# Patient Record
Sex: Female | Born: 1945
Health system: Southern US, Community
[De-identification: ages and names within clinical notes are randomized; demographics above are authoritative.]

## PROBLEM LIST (undated history)

## (undated) DIAGNOSIS — K219 Gastro-esophageal reflux disease without esophagitis: Secondary | ICD-10-CM

## (undated) DIAGNOSIS — N393 Stress incontinence (female) (male): Secondary | ICD-10-CM

## (undated) DIAGNOSIS — Q210 Ventricular septal defect: Secondary | ICD-10-CM

## (undated) DIAGNOSIS — E785 Hyperlipidemia, unspecified: Secondary | ICD-10-CM

## (undated) DIAGNOSIS — G540 Brachial plexus disorders: Secondary | ICD-10-CM

## (undated) DIAGNOSIS — G43909 Migraine, unspecified, not intractable, without status migrainosus: Secondary | ICD-10-CM

## (undated) DIAGNOSIS — R55 Syncope and collapse: Secondary | ICD-10-CM

## (undated) DIAGNOSIS — I499 Cardiac arrhythmia, unspecified: Secondary | ICD-10-CM

## (undated) DIAGNOSIS — Z8711 Personal history of peptic ulcer disease: Secondary | ICD-10-CM

## (undated) DIAGNOSIS — I4719 Other supraventricular tachycardia: Secondary | ICD-10-CM

## (undated) DIAGNOSIS — J449 Chronic obstructive pulmonary disease, unspecified: Secondary | ICD-10-CM

## (undated) DIAGNOSIS — M810 Age-related osteoporosis without current pathological fracture: Secondary | ICD-10-CM

## (undated) DIAGNOSIS — M7501 Adhesive capsulitis of right shoulder: Secondary | ICD-10-CM

## (undated) DIAGNOSIS — I219 Acute myocardial infarction, unspecified: Secondary | ICD-10-CM

## (undated) DIAGNOSIS — R350 Frequency of micturition: Secondary | ICD-10-CM

## (undated) DIAGNOSIS — I679 Cerebrovascular disease, unspecified: Secondary | ICD-10-CM

## (undated) DIAGNOSIS — J42 Unspecified chronic bronchitis: Secondary | ICD-10-CM

## (undated) DIAGNOSIS — I319 Disease of pericardium, unspecified: Secondary | ICD-10-CM

## (undated) DIAGNOSIS — F419 Anxiety disorder, unspecified: Secondary | ICD-10-CM

## (undated) DIAGNOSIS — I471 Supraventricular tachycardia: Secondary | ICD-10-CM

## (undated) DIAGNOSIS — C801 Malignant (primary) neoplasm, unspecified: Secondary | ICD-10-CM

## (undated) DIAGNOSIS — C4491 Basal cell carcinoma of skin, unspecified: Secondary | ICD-10-CM

## (undated) DIAGNOSIS — I10 Essential (primary) hypertension: Secondary | ICD-10-CM

## (undated) DIAGNOSIS — J189 Pneumonia, unspecified organism: Secondary | ICD-10-CM

## (undated) DIAGNOSIS — Z95810 Presence of automatic (implantable) cardiac defibrillator: Secondary | ICD-10-CM

## (undated) DIAGNOSIS — I493 Ventricular premature depolarization: Secondary | ICD-10-CM

## (undated) DIAGNOSIS — Z8719 Personal history of other diseases of the digestive system: Secondary | ICD-10-CM

## (undated) DIAGNOSIS — I509 Heart failure, unspecified: Secondary | ICD-10-CM

## (undated) DIAGNOSIS — R011 Cardiac murmur, unspecified: Secondary | ICD-10-CM

## (undated) HISTORY — DX: Anxiety disorder, unspecified: F41.9

## (undated) HISTORY — DX: Disease of pericardium, unspecified: I31.9

## (undated) HISTORY — DX: Brachial plexus disorders: G54.0

## (undated) HISTORY — DX: Basal cell carcinoma of skin, unspecified: C44.91

## (undated) HISTORY — PX: CHOLECYSTECTOMY OPEN: SUR202

## (undated) HISTORY — PX: MULTIPLE TOOTH EXTRACTIONS: SHX2053

## (undated) HISTORY — DX: Gastro-esophageal reflux disease without esophagitis: K21.9

## (undated) HISTORY — DX: Cerebrovascular disease, unspecified: I67.9

## (undated) HISTORY — DX: Chronic obstructive pulmonary disease, unspecified: J44.9

## (undated) HISTORY — PX: CARDIAC CATHETERIZATION: SHX172

## (undated) HISTORY — DX: Hyperlipidemia, unspecified: E78.5

## (undated) HISTORY — DX: Malignant (primary) neoplasm, unspecified: C80.1

## (undated) HISTORY — DX: Ventricular septal defect: Q21.0

## (undated) HISTORY — DX: Essential (primary) hypertension: I10

## (undated) HISTORY — DX: Migraine, unspecified, not intractable, without status migrainosus: G43.909

## (undated) HISTORY — DX: Age-related osteoporosis without current pathological fracture: M81.0

## (undated) HISTORY — PX: VSD REPAIR: SHX276

---

## 1970-01-20 HISTORY — PX: TUBAL LIGATION: SHX77

## 1998-01-20 HISTORY — PX: CORONARY ANGIOGRAM: SHX5786

## 1998-12-20 ENCOUNTER — Ambulatory Visit (HOSPITAL_COMMUNITY): Admission: RE | Admit: 1998-12-20 | Discharge: 1998-12-20 | Payer: Self-pay | Admitting: Cardiology

## 1998-12-20 ENCOUNTER — Encounter: Payer: Self-pay | Admitting: Cardiology

## 2002-01-28 ENCOUNTER — Ambulatory Visit (HOSPITAL_COMMUNITY): Admission: RE | Admit: 2002-01-28 | Discharge: 2002-01-28 | Payer: Self-pay | Admitting: Internal Medicine

## 2003-11-08 ENCOUNTER — Ambulatory Visit: Payer: Self-pay

## 2003-11-29 ENCOUNTER — Ambulatory Visit: Payer: Self-pay | Admitting: *Deleted

## 2003-12-12 ENCOUNTER — Ambulatory Visit: Payer: Self-pay

## 2004-02-14 ENCOUNTER — Ambulatory Visit: Payer: Self-pay | Admitting: *Deleted

## 2004-02-23 ENCOUNTER — Ambulatory Visit: Payer: Self-pay

## 2004-03-05 ENCOUNTER — Ambulatory Visit: Payer: Self-pay | Admitting: *Deleted

## 2004-04-17 ENCOUNTER — Ambulatory Visit: Payer: Self-pay

## 2004-08-29 ENCOUNTER — Ambulatory Visit: Payer: Self-pay | Admitting: *Deleted

## 2004-11-25 ENCOUNTER — Ambulatory Visit: Payer: Self-pay

## 2005-03-04 ENCOUNTER — Ambulatory Visit: Payer: Self-pay | Admitting: *Deleted

## 2005-05-14 ENCOUNTER — Ambulatory Visit: Payer: Self-pay

## 2005-09-04 ENCOUNTER — Ambulatory Visit: Payer: Self-pay | Admitting: *Deleted

## 2005-11-05 ENCOUNTER — Ambulatory Visit: Payer: Self-pay | Admitting: Internal Medicine

## 2006-03-18 ENCOUNTER — Ambulatory Visit: Payer: Self-pay | Admitting: *Deleted

## 2006-06-24 ENCOUNTER — Ambulatory Visit: Payer: Self-pay

## 2006-11-05 ENCOUNTER — Ambulatory Visit: Payer: Self-pay | Admitting: Internal Medicine

## 2006-12-01 ENCOUNTER — Ambulatory Visit: Payer: Self-pay

## 2006-12-01 ENCOUNTER — Ambulatory Visit: Payer: Self-pay | Admitting: Internal Medicine

## 2006-12-01 ENCOUNTER — Encounter: Payer: Self-pay | Admitting: Internal Medicine

## 2006-12-01 LAB — CONVERTED CEMR LAB
ALT: 16 units/L (ref 0–35)
AST: 16 units/L (ref 0–37)
Albumin: 3.9 g/dL (ref 3.5–5.2)
Alkaline Phosphatase: 67 units/L (ref 39–117)
Bilirubin, Direct: 0.1 mg/dL (ref 0.0–0.3)
Cholesterol: 161 mg/dL (ref 0–200)
HDL: 70.2 mg/dL (ref >39.0)
LDL Cholesterol: 82 mg/dL (ref 0–99)
Total Bilirubin: 0.8 mg/dL (ref 0.3–1.2)
Total CHOL/HDL Ratio: 2.3
Total Protein: 7 g/dL (ref 6.0–8.3)
Triglycerides: 45 mg/dL (ref 0–149)
VLDL: 9 mg/dL (ref 0–40)

## 2007-05-20 ENCOUNTER — Ambulatory Visit: Payer: Self-pay | Admitting: Internal Medicine

## 2007-09-01 ENCOUNTER — Ambulatory Visit: Payer: Self-pay

## 2007-11-15 ENCOUNTER — Encounter: Payer: Self-pay | Admitting: Internal Medicine

## 2007-11-15 ENCOUNTER — Ambulatory Visit: Payer: Self-pay

## 2007-11-15 ENCOUNTER — Ambulatory Visit: Payer: Self-pay | Admitting: Internal Medicine

## 2007-12-06 ENCOUNTER — Ambulatory Visit (HOSPITAL_COMMUNITY): Admission: RE | Admit: 2007-12-06 | Discharge: 2007-12-06 | Payer: Self-pay | Admitting: Internal Medicine

## 2007-12-06 ENCOUNTER — Ambulatory Visit: Payer: Self-pay | Admitting: Cardiovascular Disease

## 2007-12-27 ENCOUNTER — Ambulatory Visit: Payer: Self-pay | Admitting: Internal Medicine

## 2007-12-27 LAB — CONVERTED CEMR LAB
BUN: 12 mg/dL (ref 6–23)
CO2: 31 meq/L (ref 19–32)
Calcium: 9.4 mg/dL (ref 8.4–10.5)
Chloride: 104 meq/L (ref 96–112)
Creatinine, Ser: 0.6 mg/dL (ref 0.4–1.2)
GFR calc Af Amer: 131 mL/min
GFR calc non Af Amer: 108 mL/min
Glucose, Bld: 54 mg/dL — ABNORMAL LOW (ref 70–99)
Potassium: 3.8 meq/L (ref 3.5–5.1)
Sodium: 142 meq/L (ref 135–145)

## 2008-02-28 ENCOUNTER — Ambulatory Visit: Payer: Self-pay | Admitting: Internal Medicine

## 2008-05-08 ENCOUNTER — Telehealth: Payer: Self-pay | Admitting: Internal Medicine

## 2008-09-06 ENCOUNTER — Encounter (INDEPENDENT_AMBULATORY_CARE_PROVIDER_SITE_OTHER): Payer: Self-pay | Admitting: *Deleted

## 2008-11-08 ENCOUNTER — Ambulatory Visit: Payer: Self-pay | Admitting: Internal Medicine

## 2008-11-10 LAB — CONVERTED CEMR LAB
ALT: 16 units/L (ref 0–35)
AST: 16 units/L (ref 0–37)

## 2008-11-13 ENCOUNTER — Ambulatory Visit: Payer: Self-pay | Admitting: Internal Medicine

## 2008-11-13 DIAGNOSIS — F17200 Nicotine dependence, unspecified, uncomplicated: Secondary | ICD-10-CM

## 2008-11-13 DIAGNOSIS — F17210 Nicotine dependence, cigarettes, uncomplicated: Secondary | ICD-10-CM | POA: Insufficient documentation

## 2008-11-13 DIAGNOSIS — Z87891 Personal history of nicotine dependence: Secondary | ICD-10-CM | POA: Insufficient documentation

## 2008-11-13 DIAGNOSIS — J441 Chronic obstructive pulmonary disease with (acute) exacerbation: Secondary | ICD-10-CM | POA: Insufficient documentation

## 2008-11-13 DIAGNOSIS — E785 Hyperlipidemia, unspecified: Secondary | ICD-10-CM | POA: Insufficient documentation

## 2008-11-13 DIAGNOSIS — J449 Chronic obstructive pulmonary disease, unspecified: Secondary | ICD-10-CM | POA: Insufficient documentation

## 2008-11-13 DIAGNOSIS — Q210 Ventricular septal defect: Secondary | ICD-10-CM

## 2008-11-13 DIAGNOSIS — I739 Peripheral vascular disease, unspecified: Secondary | ICD-10-CM

## 2008-12-19 ENCOUNTER — Ambulatory Visit: Payer: Self-pay

## 2009-05-28 ENCOUNTER — Telehealth: Payer: Self-pay | Admitting: Internal Medicine

## 2009-05-31 ENCOUNTER — Ambulatory Visit: Payer: Self-pay | Admitting: Internal Medicine

## 2009-05-31 DIAGNOSIS — R5383 Other fatigue: Secondary | ICD-10-CM | POA: Insufficient documentation

## 2009-05-31 DIAGNOSIS — R5381 Other malaise: Secondary | ICD-10-CM

## 2009-05-31 DIAGNOSIS — R079 Chest pain, unspecified: Secondary | ICD-10-CM

## 2009-06-01 LAB — CONVERTED CEMR LAB
BUN: 10 mg/dL (ref 6–23)
Basophils Absolute: 0 10*3/uL (ref 0.0–0.1)
Basophils Relative: 0.6 % (ref 0.0–3.0)
CO2: 28 meq/L (ref 19–32)
Calcium: 9.5 mg/dL (ref 8.4–10.5)
Chloride: 99 meq/L (ref 96–112)
Creatinine, Ser: 0.7 mg/dL (ref 0.4–1.2)
Digitoxin Lvl: 0.2 ng/mL — ABNORMAL LOW (ref 0.8–2.0)
Eosinophils Absolute: 0.1 10*3/uL (ref 0.0–0.7)
Eosinophils Relative: 1.2 % (ref 0.0–5.0)
GFR calc non Af Amer: 98.08 mL/min (ref >60)
Glucose, Bld: 88 mg/dL (ref 70–99)
HCT: 40.9 % (ref 36.0–46.0)
Hemoglobin: 13.9 g/dL (ref 12.0–15.0)
INR: 1.1 — ABNORMAL HIGH (ref 0.8–1.0)
Lymphocytes Relative: 39.4 % (ref 12.0–46.0)
Lymphs Abs: 2.2 10*3/uL (ref 0.7–4.0)
MCHC: 34.1 g/dL (ref 30.0–36.0)
MCV: 92.1 fL (ref 78.0–100.0)
Monocytes Absolute: 0.4 10*3/uL (ref 0.1–1.0)
Monocytes Relative: 6.9 % (ref 3.0–12.0)
Neutro Abs: 2.9 10*3/uL (ref 1.4–7.7)
Neutrophils Relative %: 51.9 % (ref 43.0–77.0)
Platelets: 91 10*3/uL — ABNORMAL LOW (ref 150.0–400.0)
Potassium: 4.5 meq/L (ref 3.5–5.1)
Pro B Natriuretic peptide (BNP): 80.8 pg/mL (ref 0.0–100.0)
Prothrombin Time: 11.3 s (ref 9.1–11.7)
RBC: 4.43 M/uL (ref 3.87–5.11)
RDW: 14.4 % (ref 11.5–14.6)
Sodium: 140 meq/L (ref 135–145)
TSH: 1.3 microintl units/mL (ref 0.35–5.50)
WBC: 5.6 10*3/uL (ref 4.5–10.5)
aPTT: 18.6 s — ABNORMAL LOW (ref 21.7–28.8)

## 2009-06-05 ENCOUNTER — Telehealth (INDEPENDENT_AMBULATORY_CARE_PROVIDER_SITE_OTHER): Payer: Self-pay | Admitting: *Deleted

## 2009-06-06 ENCOUNTER — Encounter (HOSPITAL_COMMUNITY): Admission: RE | Admit: 2009-06-06 | Discharge: 2009-08-21 | Payer: Self-pay | Admitting: Internal Medicine

## 2009-06-06 ENCOUNTER — Ambulatory Visit: Payer: Self-pay | Admitting: Cardiology

## 2009-06-06 ENCOUNTER — Ambulatory Visit: Payer: Self-pay

## 2009-06-15 ENCOUNTER — Telehealth: Payer: Self-pay | Admitting: Internal Medicine

## 2009-07-06 ENCOUNTER — Encounter (INDEPENDENT_AMBULATORY_CARE_PROVIDER_SITE_OTHER): Payer: Self-pay | Admitting: *Deleted

## 2009-07-31 ENCOUNTER — Telehealth: Payer: Self-pay | Admitting: Internal Medicine

## 2009-08-07 ENCOUNTER — Ambulatory Visit: Payer: Self-pay | Admitting: Internal Medicine

## 2009-08-08 LAB — CONVERTED CEMR LAB
BUN: 12 mg/dL (ref 6–23)
Basophils Absolute: 0.1 10*3/uL (ref 0.0–0.1)
Basophils Relative: 0.9 % (ref 0.0–3.0)
CO2: 31 meq/L (ref 19–32)
Calcium: 8.9 mg/dL (ref 8.4–10.5)
Chloride: 104 meq/L (ref 96–112)
Creatinine, Ser: 0.9 mg/dL (ref 0.4–1.2)
Eosinophils Absolute: 0.1 10*3/uL (ref 0.0–0.7)
Eosinophils Relative: 1.3 % (ref 0.0–5.0)
GFR calc non Af Amer: 64.83 mL/min (ref >60)
Glucose, Bld: 101 mg/dL — ABNORMAL HIGH (ref 70–99)
HCT: 40.1 % (ref 36.0–46.0)
Hemoglobin: 13.7 g/dL (ref 12.0–15.0)
INR: 1 (ref 0.8–1.0)
Lymphocytes Relative: 34 % (ref 12.0–46.0)
Lymphs Abs: 2.5 10*3/uL (ref 0.7–4.0)
MCHC: 34 g/dL (ref 30.0–36.0)
MCV: 92 fL (ref 78.0–100.0)
Monocytes Absolute: 0.5 10*3/uL (ref 0.1–1.0)
Monocytes Relative: 6.6 % (ref 3.0–12.0)
Neutro Abs: 4.1 10*3/uL (ref 1.4–7.7)
Neutrophils Relative %: 57.2 % (ref 43.0–77.0)
Platelets: 271 10*3/uL (ref 150.0–400.0)
Potassium: 4.4 meq/L (ref 3.5–5.1)
Prothrombin Time: 10.9 s (ref 9.7–11.8)
RBC: 4.36 M/uL (ref 3.87–5.11)
RDW: 14.6 % (ref 11.5–14.6)
Sodium: 139 meq/L (ref 135–145)
WBC: 7.2 10*3/uL (ref 4.5–10.5)
aPTT: 26.2 s (ref 21.7–28.8)

## 2009-08-10 ENCOUNTER — Telehealth: Payer: Self-pay | Admitting: Internal Medicine

## 2009-08-13 ENCOUNTER — Telehealth: Payer: Self-pay | Admitting: Internal Medicine

## 2009-08-13 DIAGNOSIS — R0602 Shortness of breath: Secondary | ICD-10-CM | POA: Insufficient documentation

## 2009-08-29 ENCOUNTER — Ambulatory Visit: Payer: Self-pay | Admitting: Internal Medicine

## 2009-08-30 ENCOUNTER — Encounter: Payer: Self-pay | Admitting: Internal Medicine

## 2009-08-31 ENCOUNTER — Ambulatory Visit: Payer: Self-pay | Admitting: Internal Medicine

## 2009-09-19 ENCOUNTER — Telehealth: Payer: Self-pay | Admitting: Internal Medicine

## 2010-02-15 ENCOUNTER — Ambulatory Visit: Admit: 2010-02-15 | Payer: Self-pay | Admitting: Internal Medicine

## 2010-02-19 NOTE — Letter (Signed)
Summary: Appointment - Reminder 2  [facility] [employer], Main Office  [address]   [place], [place] [address]   Phone: [phone]  Fax: [phone]     July 06, 2009 MRN: [id number]   [facility] [address] [place], [place]  [address]   Dear Ms. NOREN,  Our records indicate that it is time to schedule a follow-up appointment with Dr. Tenny Craw in August. It is very important that we reach you to schedule this appointment. We look forward to participating in your health care needs. Please contact us at the number listed above at your earliest convenience to schedule your appointment.  If you are unable to make an appointment at this time, give [place] a call so we can update our records.     Sincerely,  [facility] Scheduling Team

## 2010-02-19 NOTE — Progress Notes (Signed)
Summary: please mail paper for handicapp sticker when ready  Phone Note Call from Patient   Caller: Patient Reason for Call: Talk to Nurse Summary of Call: pt in on tuesday and brought a paper for dr Tenny Craw to sign for her a handicapp sticker-would like for [place] to mail when ready please Initial call taken by: Glynda Jaeger,  August 10, 2009 9:33 AM  Follow-up for Phone Call        Handicapp sticker signed and mailed to patient...she is aware. She states that she feels better lately. No complaints of chest pain or fatique. Does admit to some SOB walking up hills but continues to smoke. She does not think she wants to have heart catherization done but will call me back on 7/25 to let me know her decision. Will let Dr.Jerell Demery know. Follow-up by: Suzan Garibaldi RN  Additional Follow-up for Phone Call Additional follow up Details #1::        i would recommend PFTs at the least if she doesn't have cath. Additional Follow-up by: Sherrill Raring, MD, [facility],  August 10, 2009 5:40 PM

## 2010-02-19 NOTE — Progress Notes (Signed)
Summary: wants to speak with Dr Tenny Craw; DR. Tenny Craw TO REVIEW  (07/05/09  JS)  Phone Note Call from Patient Call back at Home Phone [phone]   Caller: Patient Summary of Call: Pt calling regarding test results Initial call taken by: Judie Grieve,  Jun 15, 2009 8:34 AM  Follow-up for Phone Call        Reviewed results with pt and that Dr Tenny Craw would like to review prior MRI before making further reccomendations.  Pt states she has had 2 more days where she had no energy and feeling like she needed to lay down.  These days where not together.  The heat has made it worse.  She also states she has had some fleeting chest pain that last approximately 30 to 45 seconds and resolves on its on with rest.  She does have this discomfort on days she has no energy but denies any radiation, SOB, N/V or diaphoresis.  She is pain free at this time.  Informed pt I will send this information to Dr Tenny Craw for her review.  Pt request we call her on her home phone 1st and if she isn't there to call her cell [phone]. Follow-up by: Charolotte Capuchin, RN,  Jun 15, 2009 9:09 AM  Additional Follow-up for Phone Call Additional follow up Details #1::        Patient was called wks ago. Additional Follow-up by: Sherrill Raring, MD, [facility],  July 16, 2009 1:59 PM

## 2010-02-19 NOTE — Miscellaneous (Signed)
Summary: Orders Update  Clinical Lists Changes  Orders: Added new Test order of T-2 View CXR (71020TC) - Signed 

## 2010-02-19 NOTE — Assessment & Plan Note (Signed)
Summary: 10 MO F/U   Visit Type:  Follow-up Primary Rylinn Linzy:  dr Barnabas Lister in [place]   History of Present Illness: patient is a 65 year old with a history of VSD status post repair, peripheral vascular disease, dyslipidemia, and continued tobacco use. I last saw her in May.   When I saw her last she had complaints of chest tightness, fatigue, shortness of breath.  She had a myoview that showed septal, lateral and apical scar.  No ischemia.  CXR showed no acute problems.  PFTs showed mod  defect that improved some with inhalers.   Today the paitient said she really has not had any more spells of tightness. She has occasional chest pains that she has had for a long time, unchanged.   Breathing is unchanged. She continues to smoke.  She does not think she needs a cardiac catheterization.  Current Medications (verified): 1)  Lanoxin 0.125 Mg Tabs (Digoxin) .... Take 1/2 Tablet By Mouth Once A Day 2)  Lisinopril 5 Mg Tabs (Lisinopril) .... Take 1 Tablet By Mouth Once A Day 3)  Aspirin 81 Mg  Tabs (Aspirin) .Marland Kitchen.. 1 Tab Once Daily 4)  Zocor 40 Mg Tabs (Simvastatin) .Marland Kitchen.. 1 Tab Once Daily 5)  Furosemide 20 Mg Tabs (Furosemide) .... Take One-Half  Tablet By Mouth Daily. 6)  Celebrex 200 Mg Caps (Celecoxib) .... As Needed  Allergies (verified): 1)  ! Codeine  Past History:  Past medical, surgical, family and social histories (including risk factors) reviewed, and no changes noted (except as noted below).  Past Medical History: Reviewed history from 11/13/2008 and no changes required. VSD PVOD COPD Dyslipidemia tobacco abuse Anxiety, GERD Pericarditis Thoracic outlet syndrom  Past Surgical History: Reviewed history from 11/13/2008 and no changes required. VSD closure.  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 Father died of throat Ca Mother with hx HTN FHx  of CVA  Social History: Reviewed history from 11/13/2008 and no changes required. married Tobacco 1/3ppd No etoh  Vital Signs:  Patient profile:   65 year old female Height:      61 inches Weight:      133 pounds Pulse rate:   64 / minute BP sitting:   106 / 66  (left arm)  Vitals Entered By: Burnett Kanaris, CNA (August 31, 2009 11:39 AM)  Serial Vital Signs/Assessments:  Time      Position  BP       Pulse  Resp  Temp     By 0 mi n    Lying LA  104/66   65                    Burnett Kanaris, CNA 0 mi n    Sitting   106/66   64                    Burnett Kanaris, CNA 0 mi n    Standing  93/60    70                    Burnett Kanaris, CNA 2 min     Standing  98/66    74                    Burnett Kanaris, CNA 3 min     Standing  102/68   74  Burnett Kanaris, CNA  Comments: 0 mi n no dizziness By: Burnett Kanaris, CNA  2 min no dizziness By: Burnett Kanaris, CNA  3 min no dizziness By: Burnett Kanaris, CNA    Physical Exam  Additional Exam:  Patient is in NAD HEENT:  Normocephalic, atraumatic. EOMI, PERRLA.  Neck: JVP is normal.  Lungs: . No rales no wheezes. Rhonchi Heart: Regular rate and rhythm. Normal S1, S2. No S3.   No significant murmurs.   Abdomen:  Supple, nontender. Normal bowel sounds. No masses.  Extremities:    No lower extremity edema.  Musculoskeletal :moving all extremities.  Neuro:   alert and oriented x3.    EKG  Procedure date:  08/31/2009  Findings:      Ectopic atrial rhythm.  64 bpm.  RBBB.  Impression & Recommendations:  Problem # 1:  CHEST PAIN UNSPECIFIED (ICD-786.50) Patient denies any problems now.  She does not want to have  a cardiac cath.  Said she would let me know if her symptoms worsen. No change for regimen for now.  Will review scans.  Keep on same regimen.  Problem # 2:  COPD (ICD-496) COntinue inhalers. Her updated medication list for this problem includes:    Ventolin Hfa 108 (90 Base) Mcg/act Aers (Albuterol  sulfate) ..... Use every 4 to 6 hours only as needed    Xopenex Hfa 45 Mcg/act Aero (Levalbuterol tartrate) .Marland Kitchen... 1 puff 2 times per day  Problem # 3:  HYPERLIPIDEMIA-MIXED (ICD-272.4) Keep on same regimen. Her updated medication list for this problem includes:    Zocor 40 Mg Tabs (Simvastatin) .Marland Kitchen... 1 tab once daily  Other Orders: EKG w/ Interpretation (93000) Prescriptions: VENTOLIN HFA 108 (90 BASE) MCG/ACT AERS (ALBUTEROL SULFATE) use every 4 to 6 hours only as needed  #1 x 6   Entered by:   Layne Benton, RN, BSN   Authorized by:   Sherrill Raring, MD, [facility]   Signed by:   Layne Benton, RN, BSN on 08/31/2009   Method used:   Electronically to        [facility] (retail)       [address]       [place], [place]  [address]       Ph: [phone]       Fax: [phone]   RxID:   [id number]

## 2010-02-19 NOTE — Progress Notes (Signed)
Summary: Pt returning call  Phone Note Call from Patient Call back at Work Phone [phone]   Summary of Call: Pt returning call from Friday Initial call taken by: Judie Grieve,  August 13, 2009 9:18 AM  Follow-up for Phone Call        Called patient....since she is feeling better and is only c/o of SOB with exertion Dr.Ross said she could hold off on heart cath and have PFT's done. Will make referral for PFT's. Follow-up by: Suzan Garibaldi RN  New Problems: SHORTNESS OF BREATH (ICD-786.05)   New Problems: SHORTNESS OF BREATH (ICD-786.05)

## 2010-02-19 NOTE — Assessment & Plan Note (Signed)
Summary: Sting in chest, tired all the time,cough/NM   Visit Type:  Follow-up Primary Provider:  dr Barnabas Lister in [place]  CC:  fatigue and stinging in chest.  History of Present Illness:  patient is a 65 year old with a history of VSD status post repair, peripheral vascular disease, dyslipidemia, and continued tobacco use. I last saw her in October. Since seen she has callied in with complaints of chest pain. Patient.. c/o of a sting feeling  on left breast at center of chest. Pt. states she has had this for many years. It has bother her more  the last two days. She coughs a lot during the night. Pt. states she has had a sinus infection and ear infection for which she took antibiotics for 6 days. She feels better about that now. She is very outgoing can't sit still. She is very tired, exhausted now.  Current Medications (verified): 1)  Lanoxin 0.125 Mg Tabs (Digoxin) .... Take 1/2 Tablet By Mouth Once A Day 2)  Lisinopril 5 Mg Tabs (Lisinopril) .... Take 1 Tablet By Mouth Once A Day 3)  Aspirin 81 Mg  Tabs (Aspirin) .Marland Kitchen.. 1 Tab Once Daily 4)  Zocor 40 Mg Tabs (Simvastatin) .Marland Kitchen.. 1 Tab Once Daily 5)  Furosemide 20 Mg Tabs (Furosemide) .... Take One-Half  Tablet By Mouth Daily. 6)  Celebrex 200 Mg Caps (Celecoxib) .... As Needed  Allergies (verified): 1)  ! Codeine  Past History:  Family History: Last updated: 2008-11-19 Father died of throat Ca Mother with hx HTN FHx of CVA  Past medical, surgical, family and social histories (including risk factors) reviewed, and no changes noted (except as noted below).  Past Medical History: Reviewed history from 11-19-08 and no changes required. VSD PVOD COPD Dyslipidemia tobacco abuse Anxiety, GERD Pericarditis Thoracic outlet syndrom  Past Surgical History: Reviewed history from 2008-11-19 and no changes required. VSD closure.  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 Father died of throat Ca Mother with hx HTN FHx of CVA  Social History: Reviewed history from 11/19/08 and no changes required. married Tobacco 1/3ppd No etoh  Vital Signs:  Patient profile:   65 year old female Height:      60 inches Weight:      131 pounds BMI:     25.68 Pulse rate:   67 / minute BP sitting:   124 / 83  (left arm) Cuff size:   regular  Vitals Entered By: Layne Benton, RN, BSN (May 31, 2009 12:36 PM)  Physical Exam  Additional Exam:  Patient is in NAD HEENT:  Normocephalic, atraumatic. EOMI, PERRLA.  Neck: JVP is normal. No thyromegaly. No bruits.  Lungs: clear to auscultation. No rales no wheezes.  Heart: Regular rate and rhythm. Normal S1, S2. No S3.   No significant murmurs. PMI not displaced.  Abdomen:  Supple, nontender. Normal bowel sounds. No masses. No hepatomegaly.  Extremities:   Good distal pulses throughout. No lower extremity edema.  Musculoskeletal :moving all extremities.  Neuro:   alert and oriented x3.    EKG  Procedure date:  05/31/2009  Findings:      NSR>  67 bpm.  Occas PVC.  RBBB.  Impression & Recommendations:  Problem # 1:  CHEST PAIN UNSPECIFIED (ICD-786.50) Patient complaining of chest pains and signifiant fatigue.  I would recommen checking labs today.  WIll tentatively set patient up for adenosine myoview. Volume status  looks good.  Will need to review echo though with decrase in EF.  Problem # 2:  FATIGUE (ICD-780.79) As above.  Problem # 3:  HYPERLIPIDEMIA-MIXED (ICD-272.4) Keep on meds. Her updated medication list for this problem includes:    Zocor 40 Mg Tabs (Simvastatin) .Marland Kitchen... 1 tab once daily  Problem # 4:  TOBACCO ABUSE (ICD-305.1) Counselled. on risks.  Other Orders: EKG w/ Interpretation (93000) Nuclear Stress Test (Nuc Stress Test) TLB-BMP (Basic Metabolic Panel-BMET) (80048-METABOL) TLB-BNP (B-Natriuretic Peptide)  (83880-BNPR) TLB-CBC Platelet - w/Differential (85025-CBCD) TLB-TSH (Thyroid Stimulating Hormone) (84443-TSH) TLB-Digoxin (Lanoxin) (80162-DIG) TLB-PT (Protime) (85610-PTP) TLB-PTT (85730-PTTL)  Patient Instructions: 1)  Your physician has requested that you have an adenosine myoview.  For further information please visit [email or web address]  Please follow instruction sheet, as given. 2)  Your physician recommends that you return for lab work in: lab work today   Appended Document: Sting in chest, tired all the time,cough/NM I have spoken to the patient a few times on the phone.  Myoview on 5/18 did not show any ischemia.  But the patient said that she continues to have shortness of breath, gives out easier than before.  Gets chest pressure.  THis is different than 1 year ago.  Recommend labs and CXR.  If no significant abnormalities would recommend a R and L heart cath to define anatomy and pressures.  Patient understands and is agreeable.

## 2010-02-19 NOTE — Miscellaneous (Signed)
Summary: Orders Update pft charges  Clinical Lists Changes  Orders: Added new Service order of Carbon Monoxide diffusing w/capacity (94720) - Signed Added new Service order of Lung Volumes (94240) - Signed Added new Service order of Spirometry (Pre & Post) (94060) - Signed 

## 2010-02-19 NOTE — Progress Notes (Signed)
Summary: CHEST PAINS  Phone Note Call from Patient Call back at Home Phone [phone]   Caller: Patient Summary of Call: PT BEEN HAVING CHEST PAINS Initial call taken by: Judie Grieve,  May 28, 2009 1:27 PM  Follow-up for Phone Call        Spoke with patient. Pt. c/o of a sting feeling  on left breast at center of chest. Pt. states she has had this for many years. It has bother her more  the last two days. She coughs a lot during the night. Pt. states she has had a sinus infection and ear infection for which she took antibiotics for 6 days. She feels better about that now. She is very outgoing can't sit still. She is very tired, exhausted now. Pt. states the sting feeling is not bad enough to go to ER. She is just tired of being tired. She would like to know what Dr. Tenny Craw would like for pt. to do. Ollen Gross, RN, BSN  May 28, 2009 2:04 PM Dr. Tenny Craw notified. MD recommeds  to make an appointment on next available for pt. Pt. has an appointment with Dr. Tenny Craw on 05/31/09 at 12:30 pm. Pt. aware.  Follow-up by: Ollen Gross, RN, BSN,  May 28, 2009 2:29 PM

## 2010-02-19 NOTE — Progress Notes (Signed)
  Phone Note Outgoing Call   Call placed by: Dietrich Pates Summary of Call: Called patient.  Still has SOB with activity,.  Breathing not as good as in the fall.  Hx of cont'd tobacco. Last episode of chest stinging was 2 wks ago. Recommend:  Set up for PA/Lat CXR as well as precath labs.   If not revealing will set up for R and L heart cath. Initial call taken by: Sherrill Raring, MD, [facility],  July 31, 2009 5:58 PM     Appended Document:  Called patient ...she will come in on 08/06/2009 for labs and chest xray.

## 2010-02-19 NOTE — Progress Notes (Signed)
Summary: Nuclear Pre-Procedure  Phone Note Outgoing Call   Call placed by: Milana Na, EMT-P,  Jun 05, 2009 4:33 PM Summary of Call: Reviewed information on Myoview Information Sheet (see scanned document for further details).  Spoke with patient.     Nuclear Med Background Indications for Stress Test: Evaluation for Ischemia   History: COPD   Symptoms: Chest Pain, Fatigue    Nuclear Pre-Procedure Cardiac Risk Factors: Lipids, PVD, RBBB, Smoker Height (in): 60  Nuclear Med Study Referring MD:  P.Ross

## 2010-02-19 NOTE — Progress Notes (Signed)
Summary: need antibiotic for infected tooth  Phone Note Call from Patient Call back at Home Phone [phone]   Caller: Patient Reason for Call: Talk to Nurse Summary of Call: Per pt calling pt need to have some dental work for infected tooth, In the meantime pt is looking for dentist but would like for dr. Tenny Craw to call in an  antibiotic. Initial call taken by: Lorne Skeens,  September 19, 2009 8:44 AM  Follow-up for Phone Call        [facility] for call back.Layne Benton, RN, BSN  September 19, 2009 10:02 AM   Additional Follow-up for Phone Call Additional follow up Details #1::        Patient called back and I advised her that I could not call in an antiobiotic for her because she needs to see a dentist today for possible tooth infection. She verbalized understanding. Layne Benton, RN, BSN  September 19, 2009 11:24 AM

## 2010-02-19 NOTE — Assessment & Plan Note (Signed)
Summary: Cardiology Nuclear Study  Nuclear Med Background Indications for Stress Test: Evaluation for Ischemia   History: COPD, Echo, Heart Catheterization, Myocardial Perfusion Study  History Comments: '58 & '72 VSD closure; '00 Cath:no sig. CAD; '06 Echo:EF=40-50%  Symptoms: Chest Pain, DOE, Fatigue, Palpitations, Rapid HR  Symptoms Comments: Last episode of NW:GNFAOZ, 06/03/09   Nuclear Pre-Procedure Cardiac Risk Factors: Hypertension, Lipids, PVD, RBBB, Smoker Caffeine/Decaff Intake: None NPO After: 7:00 PM Lungs: Clear.  O2 Sat 98% on RA. IV 0.9% NS with Angio Cath: 20g     IV Site: (R) AC IV Started by: Stanton Kidney EMT-P Chest Size (in) 36     Cup Size B     Height (in): 61 Weight (lb): 134 BMI: 25.41  Nuclear Med Study 1 or 2 day study:  1 day     Stress Test Type:  Eugenie Birks Reading MD:  Olga Millers, MD     Referring MD:  Dietrich Pates, MD Resting Radionuclide:  Technetium 76m Tetrofosmin     Resting Radionuclide Dose:  11 mCi  Stress Radionuclide:  Technetium 45m Tetrofosmin     Stress Radionuclide Dose:  33 mCi   Stress Protocol   Lexiscan: 0.4 mg   Stress Test Technologist:  [employer] CMA-N     Nuclear Technologist:  Domenic Polite CNMT  Rest Procedure  Myocardial perfusion imaging was performed at rest 45 minutes following the intravenous administration of Myoview Technetium 51m Tetrofosmin.  Stress Procedure  Baseline EKG shows RBBB with intermittent junctional rhythm and occasional PVC's.  The patient received IV Lexiscan 0.4 mg over 15-seconds.  Myoview injected at 30-seconds.  There were T-wave changes with lexiscan.  She did c/o chest tightness.Quantitative spect images were obtained after a 45 minute delay.  QPS Raw Data Images:  There is no interference from other nuclear activity. Stress Images:  There is decreased uptake in the septum and apical lateral wall. Rest Images:  There is decreased uptake in the septum and apical lateral  wall. Subtraction (SDS):  No evidence of ischemia. Transient Ischemic Dilatation:  1.07  (Normal <1.22)  Lung/Heart Ratio:  .29  (Normal <0.45)  Quantitative Gated Spect Images QGS EDV:  102 ml QGS ESV:  71 ml QGS EF:  30 % QGS cine images:  Akinesis of the septum and apical lateral wall; evidence of left ventricular enlargement.   Overall Impression  Exercise Capacity: Lexiscan study with no exercise. BP Response: Normal blood pressure response. Clinical Symptoms: There is chest pain. ECG Impression: Significant ST abnormalities consistent with ischemia. Overall Impression: Abnormal lexiscan nuclear study with evidence of prior septal and apical lateral infarcts; no ischemia.  Appended Document: Cardiology Nuclear Study Left msg on machine that we would call back.  Did not leave much on info on machine   Myoview scan shows no ischemia.  Blood flow rel unchanged from previous scan.  Will need to review prior MRI before making further recommendations.

## 2010-03-15 ENCOUNTER — Telehealth: Payer: Self-pay | Admitting: Internal Medicine

## 2010-03-19 NOTE — Progress Notes (Signed)
Summary: refill pt out need refilled today  Phone Note Refill Request Message from:  Patient on March 15, 2010 8:35 AM  Refills Requested: Medication #1:  LISINOPRIL 5 MG TABS Take 1 tablet by mouth once a day [facility] [phone] pt out need today  Initial call taken by: Judie Grieve,  March 15, 2010 8:36 AM    Prescriptions: LISINOPRIL 5 MG TABS (LISINOPRIL) Take 1 tablet by mouth once a day  #30 x 3   Entered by:   Burnett Kanaris, CNA   Authorized by:   Sherrill Raring, MD, [facility]   Signed by:   Burnett Kanaris, CNA on 03/15/2010   Method used:   Electronically to        [facility] (retail)       [address]       [place], [place]  [address]       Ph: [phone]       Fax: [phone]   RxID:   [id number]

## 2010-04-05 ENCOUNTER — Ambulatory Visit: Payer: Self-pay | Admitting: Internal Medicine

## 2010-05-06 ENCOUNTER — Telehealth: Payer: Self-pay | Admitting: Internal Medicine

## 2010-05-06 NOTE — Telephone Encounter (Signed)
Pt calling requesting order for lab work to be sent to [facility] office

## 2010-05-15 ENCOUNTER — Other Ambulatory Visit (INDEPENDENT_AMBULATORY_CARE_PROVIDER_SITE_OTHER): Payer: Medicare Other | Admitting: *Deleted

## 2010-05-15 DIAGNOSIS — R0989 Other specified symptoms and signs involving the circulatory and respiratory systems: Secondary | ICD-10-CM

## 2010-05-16 ENCOUNTER — Other Ambulatory Visit (INDEPENDENT_AMBULATORY_CARE_PROVIDER_SITE_OTHER): Payer: Medicare Other | Admitting: *Deleted

## 2010-05-16 DIAGNOSIS — I499 Cardiac arrhythmia, unspecified: Secondary | ICD-10-CM

## 2010-05-16 DIAGNOSIS — E7800 Pure hypercholesterolemia, unspecified: Secondary | ICD-10-CM

## 2010-05-16 DIAGNOSIS — Z79899 Other long term (current) drug therapy: Secondary | ICD-10-CM

## 2010-05-17 LAB — BASIC METABOLIC PANEL
CO2: 22 mEq/L (ref 19–32)
Calcium: 9.2 mg/dL (ref 8.4–10.5)
Creat: 0.76 mg/dL (ref 0.40–1.20)
Sodium: 139 mEq/L (ref 135–145)

## 2010-05-17 LAB — LIPID PANEL
LDL Cholesterol: 131 mg/dL — ABNORMAL HIGH (ref 0–99)
Triglycerides: 80 mg/dL (ref <150)

## 2010-05-17 LAB — DIGOXIN LEVEL: Digoxin Level: 0.2 ng/mL — ABNORMAL LOW (ref 0.8–2.0)

## 2010-05-17 LAB — AST: AST: 17 U/L (ref 0–37)

## 2010-05-18 ENCOUNTER — Encounter: Payer: Self-pay | Admitting: Internal Medicine

## 2010-05-20 ENCOUNTER — Ambulatory Visit (INDEPENDENT_AMBULATORY_CARE_PROVIDER_SITE_OTHER): Payer: Medicare Other | Admitting: Internal Medicine

## 2010-05-20 ENCOUNTER — Encounter: Payer: Self-pay | Admitting: Internal Medicine

## 2010-05-20 VITALS — BP 111/69 | HR 69 | Resp 18 | Ht 61.0 in | Wt 132.4 lb

## 2010-05-20 DIAGNOSIS — R0602 Shortness of breath: Secondary | ICD-10-CM

## 2010-05-20 DIAGNOSIS — E782 Mixed hyperlipidemia: Secondary | ICD-10-CM

## 2010-05-20 DIAGNOSIS — E785 Hyperlipidemia, unspecified: Secondary | ICD-10-CM

## 2010-05-20 DIAGNOSIS — F17200 Nicotine dependence, unspecified, uncomplicated: Secondary | ICD-10-CM

## 2010-05-20 MED ORDER — ALPRAZOLAM 0.25 MG PO TABS: ORAL_TABLET | ORAL | Status: DC

## 2010-05-20 NOTE — Patient Instructions (Addendum)
Start Crestor samples 5 mg every day with fasting lab work in 8 weeks  Stop Simvastatin  Your physician has recommended that you have a pulmonary function test. Pulmonary Function Tests are a group of tests that measure how well air moves in and out of your lungs.

## 2010-05-21 NOTE — Assessment & Plan Note (Signed)
counselled on quitting.

## 2010-05-21 NOTE — Assessment & Plan Note (Signed)
Have given samples of Crestor.  Will need to f/u lipids and AST

## 2010-05-21 NOTE — Assessment & Plan Note (Addendum)
Exam is worse today than on previous visits.  I would set up for repeat PFTs.  If worse, continue medical Rx and refer to pulmonary If unchanged i am concerned may reflect angina equivalent.  I would recomm cath to evaluate. Counselled on tobacco

## 2010-05-21 NOTE — Progress Notes (Signed)
HPI Patient is a 65 year old with a hsotyr od congenital heart disease (s/p VSD repair), PVOD, dyslipidemia, COPD and tobacco use. I saw her last in clinic in August 2011.   SInce seen she continues to be SOBwith activity.  She thinks it has gotten worse over the last 6 months.  She denies chest pains. She does continue to smoke.   She notes that she has been under increased stress over the past month taking care of her grandchildren and children of neighbors. Note, when I saw her in August she did not want to have a heart catheterization done.  She said her breathing was better.  She did have PFTs done that showed moderate COPD. She admits to not taking her Zocor regularly because she forgets to take it at night. Allergies  Allergen Reactions  . Codeine     Current Outpatient Prescriptions  Medication Sig Dispense Refill  . albuterol (VENTOLIN HFA) 108 (90 BASE) MCG/ACT inhaler Inhale 2 puffs into the lungs every 6 (six) hours as needed.        Marland Kitchen aspirin 81 MG tablet Take 81 mg by mouth daily.        . celecoxib (CELEBREX) 200 MG capsule Take 200 mg by mouth as needed.        . digoxin (LANOXIN) 0.125 MG tablet Take 62.5 mcg by mouth daily.        . furosemide (LASIX) 20 MG tablet Take 10 mg by mouth as needed.       Marland Kitchen lisinopril (PRINIVIL,ZESTRIL) 5 MG tablet Take 5 mg by mouth daily.        Marland Kitchen ALPRAZolam (XANAX) 0.25 MG tablet 1 tab 2 times per day as needed  10 tablet  0  . rosuvastatin (CRESTOR) 5 MG tablet Take 1 tablet (5 mg total) by mouth at bedtime.  30 tablet  11    Past Medical History  Diagnosis Date  . VSD (ventricular septal defect)   . PVOD (pulmonary veno-occlusive disease)   . COPD (chronic obstructive pulmonary disease)   . Dyslipidemia   . Anxiety   . GERD (gastroesophageal reflux disease)   . Pericarditis   . Thoracic outlet syndrome     Past Surgical History  Procedure Date  . Vsd repair   . Tubal ligation   . Cholecystectomy     Family History    Problem Relation Age of Onset  . Throat cancer    . Hypertension    . Stroke      History   Social History  . Marital Status: Married    Spouse Name: N/A    Number of Children: N/A  . Years of Education: N/A   Occupational History  . Not on file.   Social History Main Topics  . Smoking status: Current Everyday Smoker -- 0.3 packs/day  . Smokeless tobacco: Not on file  . Alcohol Use: No  . Drug Use: Not on file  . Sexually Active: Not on file   Other Topics Concern  . Not on file   Social History Narrative  . No narrative on file    Review of Systems:  All systems reviewed.  They are negative to the above problem except as previously stated.  Vital Signs: BP 111/69  Pulse 69  Resp 18  Ht 5\' 1"  (1.549 m)  Wt 132 lb 6.4 oz (60.056 kg)  BMI 25.02 kg/m2  Physical Exam  HEENT:  Normocephalic, atraumatic. EOMI, PERRLA.  Neck: JVP is normal.  No thyromegaly. No bruits.  Lungs: Rhonchi with decreased airflow..  Heart: Regular rate and rhythm. Normal S1, S2. No S3.   No significant murmurs. PMI not displaced.  Abdomen:  Supple, nontender. Normal bowel sounds. No masses. No hepatomegaly.  Extremities:   Good distal pulses throughout. No lower extremity edema.  Musculoskeletal :moving all extremities.  Neuro:   alert and oriented x3.  CN II-XII grossly intact.  Ectopic atrial rhythm.  70 bpm.  RBBB.  PVC.  Nonspecific ST T wave changes.   Assessment and Plan:

## 2010-06-04 NOTE — Assessment & Plan Note (Signed)
[facility]                            CARDIOLOGY OFFICE NOTE   NAME:Kimberly [name], Kimberly [name]                        MRN:          [id number]  DATE:11/15/2007                            DOB:          April 22, 1945    IDENTIFICATION:  The patient is a 65 year old woman with a history of  VSD (status post repair), I last saw her back in April, also history of  dyslipidemia, and continued tobacco use.   Since seen, she is suffering from a URI with lots of congestion.  She is  on amoxicillin for this.  Otherwise, her breathing was about the same.   CURRENT MEDICATIONS:  1. Aspirin 81.  2. Lanoxin 0.0625.  3. Simvastatin 40.  4. Lasix 10.  5. Amoxicillin 500 b.i.d.   PHYSICAL EXAMINATION:  GENERAL:  The patient is in no distress.  VITAL SIGNS:  Blood pressure 144/76, pulse 67 and regular, weight is  134.  NECK:  JVP is normal.  LUNGS:  Clear.  There is no decreased expiratory flow, some rhonchi, no  wheezes.  CARDIAC:  Regular rate and rhythm, S1 and S2.  No S3.  No significant  murmurs.  ABDOMEN:  Benign.  EXTREMITIES:  No edema.   A 12-lead EKG shows normal sinus rhythm.  Right bundle branch block.  Biphasic T-waves anteriorly.  Unchanged.   Chest x-ray status post sternotomy, RV is prominent.  No evidence of  CHF.   IMPRESSION:  1. History of congenital heart disease status post ventricular septal      defect repair x2.  An echocardiogram today I await to have read      out, the function looks down from previous, again the septum is      abnormal with the patch that she had placed.  I will need to      review, consider other modalities such as MRI or MUGA to really      evaluate her EF.  For now, I would keep her on the same medicines.      It looks like she was on Diovan at some point in the past and did      not tolerate because of lightheadedness.  It does not look like she      has been on a beta-blocker.  2. Dyslipidemia.  Last lipid panel was a  year ago.  We will need to      recheck.  3. Healthcare maintenance.  Counseled again on smoking cessation.   I will be in touch with the patient regarding followup.   ADDENDUM:  Chest x-ray status post sternotomy, RV is prominent.  No  evidence of CHF.     Pricilla Riffle, MD, [facility]  Electronically Signed    PVR/MedQ  DD: 11/15/2007  DT: 11/16/2007  Job #: [id number]

## 2010-06-04 NOTE — Assessment & Plan Note (Signed)
[facility]                            CARDIOLOGY OFFICE NOTE   NAME:[name], Kimberly LINGERFELT                        MRN:          [id number]  DATE:05/20/2007                            DOB:          [birth year]    IDENTIFICATION:  Ms. Rotter is a 65 year old woman, history of a VSD  (status post repair), previously followed by Glennon Hamilton.  I saw her  initially in November of last year.   When I saw her last, I scheduled an echocardiogram to evaluate her LV  function.  This showed an LVEF of approximately 35-40%.  When I compared  it to the images of 2006, there does not appear to be a significant  change.  There was mild tricuspid valve prolapse and mild regurgitation.  No evidence of a VSD.   Note, the patient has no significant CAD by catheterization in 2000, and  she also had a Myoview scan done in 2006 that showed distal septal scar  possibly related to VSD, no ischemia, EF of 46.   Since seen, the patient has been under tremendous stress until just a  couple days ago.  She was try to take care of her mother-in-law who is  demented and have returned her to the assisted living.  She has had some  chest pressure with this but thinks it was all stress related.  Yesterday, she was able to plant 36 tomatoes without a problem.  She  still gets short of breath some with walking a hill, but this is no real  change.   CURRENT MEDICINES:  Include:  1. Aspirin 81 mg.  2. Lanoxin 0.0625 daily.  3. Simvastatin 40.  4. Lasix 10 mg p.r.n.   REVIEW OF SYSTEMS:  The patient continues to smoke about a pack and a  half per day.   PHYSICAL EXAMINATION:  The patient is in no distress.  Blood pressure is  125/69.  Pulse is 60 and regular.  Weight is 134, up 3 pounds from  previous.  LUNGS:  Clear.  CARDIAC EXAM:  Regular rate and rhythm, S1-S2, no S3, no significant  murmurs.  ABDOMEN:  Benign, obese.  EXTREMITIES:  No edema.   IMPRESSION:  1. Congenital heart  disease status post ventricular septal defect      repair x2, left with some mild left ventricular dysfunction,      appears mild to moderate by echocardiogram.  I am not convinced      there is a significant change.  I would set her up for another      echocardiogram in the fall to reevaluate.  With her function, I      would like to add Diovan.  She has not tolerated it because of      dizziness in the past, but I told her try 40 mg and to contact us.  2. Dyslipidemia.  Continue on simvastatin.  Lipid panel from November:      LDL was 82, HDL was 70.  Will need periodic follow-up.   Again, if she has symptoms of chest pressure that continue,  I think she  should have further testing but would hold for now and see how she does  with decreased stress.   I did counsel her on smoking.  Follow-up in October or November as noted  above.     Pricilla Riffle, MD, [facility]  Electronically Signed    PVR/MedQ  DD: 05/20/2007  DT: 05/20/2007  Job #: [id number]   cc:   Arta Silence, MD

## 2010-06-04 NOTE — Assessment & Plan Note (Signed)
[facility]                            CARDIOLOGY OFFICE NOTE   NAME:[name], [name]                        MRN:          [id number]  DATE:02/28/2008                            DOB:          01/06/1946    IDENTIFICATION:  [name] is a 65 year old woman, I last saw her back  in October.  She has a history of a ventricular septal defects, status  post repair; peripheral vascular disease; dyslipidemia; continued  tobacco use.   When I saw her last I had ordered an echocardiogram.  She had this done,  LVEF was 40-45% with akinesis of the base mid septal wall, moderate  tricuspid valve prolapse was noted.  RV was normal in size and function.  I placed on a low dose of lisinopril 2.5 mg daily.  She has tolerated  this without dizziness.   She says her breathing is stable.  She is active with her grandchildren.  Denies any significant claudication.  No sores in her feet.  Only notes  chest tightness with the extreme mental stress.   CURRENT MEDICINES:  1. Aspirin 81.  2. Lanoxin 0.0625.  3. Simvastatin 40.  4. Lasix 10.  5. Lisinopril 2.5.   PHYSICAL EXAMINATION:  GENERAL:  The patient is in no distress at rest.  VITAL SIGNS:  Blood pressure 109/69, pulse 65, and weight 131.  NECK:  JVP is normal.  LUNGS:  Clear.  Some rhonchi.  CARDIAC:  Regular rate and rhythm.  S1 and S2.  No S3.  No murmurs.  ABDOMEN:  Benign.  EXTREMITIES:  Trace posterior tibial.  No edema.   IMPRESSION:  1. Congenital heart defect, status post repair, has had left      ventricular dysfunction since.  I am not convinced of any trend      down.  I would keep her on the same regimen.  Her EKG shows an      ectopic atrial rhythm at 65 beats per minute.  Right bundle branch      block with QRS interval of 144 msec.  I would hold now with beta-      blocker.  She does note some palpitations which she has had for      years.  I told her if she has any more spells or she feels  her      heart raised, she should call back and we will get an event      monitor.  2. Peripheral vascular disease has been seen in the past by Dr. Veneda Melter, remote lower extremity evaluation shows significant      peripheral vascular disease.  The patient though is without      complaints of claudication or any lesions.  I would keep her on      medical therapy.  3. Dyslipidemia.  We will need to get fasting lipids at her      convenience.  4. Tobacco use.  I talked to her about cessation.  She is anxious      individual.  She  will continue to try.  Did not tolerate Chantix.   I will set to see the patient back in the fall, sooner if problems  develop.      Pricilla Riffle, MD, [facility]  Electronically Signed    PVR/MedQ  DD: 02/28/2008  DT: 02/29/2008  Job #: [id number]

## 2010-06-04 NOTE — Assessment & Plan Note (Signed)
[facility]                            CARDIOLOGY OFFICE NOTE   NAME:Kimberly [name], Kimberly [name]                        MRN:          [id number]  DATE:11/05/2006                            DOB:          Jan 17, 1946    IDENTIFICATION:  Kimberly [name] is a 65 year old woman who has been followed  for years by Dr. Glennon Hamilton.  She comes in for return, she was last  seen in February of this year.   The patient has a history of a VSD, status post repair in 1958 at [facility],  and underwent a repeat repair in 1967 (by Dr. Wandalee Ferdinand).  she has been  followed since and has done well.  LVF on echo was done in 2006.  LVF  was 40-50% with some hypokinesis of the anterior wall.   Since then, the patient has continued to do fairly well.  She is still  smoking, however.  She notes occasional shortness of breath with  occasional chest pain, but there does not appear to be any association  with activity.  No increase.   CURRENT MEDICATIONS:  1. Aspirin 81 mg daily.  2. Lanoxin one-half of a 0.125 daily.  3. Simvastatin p.r.n.  4. Lasix 10 p.r.n.   PHYSICAL EXAMINATION:  The patient is in no distress.  Blood pressure is 125/61; pulse is 72; weight 131.  Lungs are clear.  Cardiac exam regular rate and rhythm.  Grade 1 to 2/6 systolic murmur  heard best at the left sternal border.  No RV heave.  Abdomen is benign, no hepatomegaly.  Extremities good distal pulses, equal and onset, no lower extremity  edema.   12 lead EKG shows ectopic atrial rhythm.  Right bundle branch block.  PR  interval of 204 milliseconds.  This has been in this range in the past.  Nonspecific ST T-wave changes.  Compared to previous EKGs there is  really no significant change.   IMPRESSION:  1. Congenital heart disease status post ventricular septal defect      repair x2, left with some mild LV dysfunction.  I would like to get      a repeat echocardiogram to evaluate.  We will be in touch with the  patient once I have seen the results.  I would keep her on the      current medicines.  It looks like she was on Diovan at some point.      I am not sure why this was discontinued.  2. Chronic obstructive pulmonary disease, continues to smoke.  I have      counseled her on this.  I will need to get her some information      from the lung association.  3. Dyslipidemia.  We will need to get a fasting lipid panel.   I will be in touch with her regarding me the test results, otherwise, I  will set follow up for six months along with her husband's.     Pricilla Riffle, MD, [facility]  Electronically Signed    PVR/MedQ  DD: 11/08/2006  DT: 11/09/2006  Job #:  [id number] 

## 2010-06-07 NOTE — Letter (Signed)
Jun 12, 2008     RE:  AERIEL, BOULAY  MRN:  [id number]  /  DOB:  September 29, 1945   To Whom It May Concern:   This is a letter regarding Kimberly [name].  She is a 65 year old woman that  I follow in clinic.  She has a significant cardiac history with history  of ventricular septal defect, she has had repair.  In addition, she has  peripheral vascular disease and dyslipidemia.   From a cardiac standpoint, I feel she is doing okay with activities as  tolerated.  With her diseases, I do not think she should be put under  increased stress/strain.  She should be able to stop as needed.   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  I am  concerned because some of the obligations related to this may indeed  lead her to overexert herself.  In the long run, this will be  detrimental to her health.  I am asking that she be taken out of this  position.   If you have any questions, please feel free to contact me.    Sincerely,      Pricilla Riffle, MD, [facility]  Electronically Signed    PVR/MedQ  DD: 06/12/2008  DT: 06/13/2008  Job #: [id number]

## 2010-06-07 NOTE — Assessment & Plan Note (Signed)
[facility]                              CARDIOLOGY OFFICE NOTE   MAURA, BRAATEN                        MRN:          [id number]  DATE:09/04/2005                            DOB:          1945/06/20    [name] is a very pleasant 65 year old white married female with a  history of congenital heart disease with ventricular septal defects status  post repair 1958 and repeat 1972.  She has COPD, continues to smoke.  She  also has peripheral vascular disease, hyperlipidemia and hypertension.  She  had normal coronaries by catheterization in 2000.  Most recent echo in  November 2006 revealed EF of 40-50%, trivial AR, mild mitral regurgitation,  mildly dilated RV, hypokinesis of the anterior wall and septum.   The patient has been under extreme stress recently with a daughter having  had a severe accident on a motorcycle.  She has had no cardiac symptoms.  Blood pressure is 108/70 at present.  Blood pressure of 126/69.  Pulse 62.  Normal sinus rhythm.  General appearance.  Normal.  JVP is not elevated.  Carotid pulses palpable and equal without bruits.  Lungs are clear.  Cardiac  exam reveals 1-2/6 systolic murmur at left sternal border at apex.  Abdomen:  __________.  Extremities:  Normal.  EKG reveals ectopic atrial rhythm and  right bundle branch block, otherwise unchanged.   I am again referring the patient for smoking cessation program.  Will plan  to see her back in six months.  She needs a lipid profile, LFTs and BMP.  Medications are aspirin 81, Vytorin 10/20, Lanoxin 0.0625 and Lasix 5.                              E. Graceann Congress, MD, [facility]    EJL/MedQ  DD:  09/04/2005  DT:  09/04/2005  Job #:  [id number]

## 2010-06-07 NOTE — Assessment & Plan Note (Signed)
[facility]                               PULMONARY OFFICE NOTE   NAME:Kimberly [name], Kimberly [name]                        MRN:          [id number]  DATE:11/05/2005                            DOB:          Jun 29, 1945    PULMONARY/ NEW PATIENT EVALUATION   HISTORY:  This patient was last seen here over 3 years ago for dyspnea and  chest pain with cough that I thought was probably upper airway in nature  either related to ACE inhibitors or reflux.  She states she did fine despite  continuing smoking after treatment, without the need for any chronic therapy  (other than to avoid ACE inhibitors) until being exposed to lots of dust  when she was mowing 3 weeks ago.  Subsequent to that she developed a severe  cough that was initially dry, then turned green with nasal congestion,  subjective wheezing, dyspnea, and needed to start both albuterol nebulizers,  multiple antibiotics, and a round of prednisone.  She is left feeling that  the cough is not completely better and is producing thick mucus, especially  when she lies down at night, but this is white in nature, associated watery  rhinitis, but no chest pain, fevers, chills, sweats, orthopnea, PND, or leg  swelling.   PAST MEDICAL HISTORY:  Significant for VSD repair.  Cholecystectomy.   ALLERGIES:  None known.   MEDICATIONS:  Albuterol p.r.n. is her only pulmonary medicine.  Please see  face sheet dated November 05, 2005 for full details.   SOCIAL HISTORY:  She continues to smoke a pack every 2 days.   FAMILY HISTORY:  Negative for respiratory issues.   REVIEW OF SYSTEMS:  Negative, except as outlined above.   PHYSICAL EXAMINATION:  This is a pleasant, ambulatory, hoarse white female  in no acute distress.  VITAL SIGNS:  Stable vital signs.  HEENT:  Remarkable for mild turbinate edema.  Oropharynx is clear.  NECK:  Supple without cervical adenopathy or tenderness. Trachea is midline.  No thyromegaly.  LUNGS:  Fields revealed mostly pseudo-wheeze that improved with pursed lip.  There are no true wheezes appreciated.  CARDIAC:  Regular rate and rhythm.  There is an S1 and S2.  ABDOMEN:  Soft and benign with negative fluid sign.  EXTREMITIES:  Warm without calf tenderness, cyanosis, clubbing, or edema.   Chest x-ray is obtained within the last 2 weeks at the [facility] is  reported to show no acute change.  Hemoglobin saturation 99% on room air  today.   IMPRESSION:  1. Refractory cough secondary to rhinitis versus reflux, versus both.  My      personal experience has been that when patients tolerate angiotensin-      converting enzyme inhibitors poorly it is because of other upper airway      insult, such as rhinitis or reflux, and addressed these today.  I      recommended a trial of Xyzal 5 mg nightly, Zyrtec 40 mg nightly, and      for up to 1 month to see if the cough resolves.  If not, a sinus CT      scan would be the next logical step.  This can be certainly done at the      [facility] and ear, nose, and throat referral locally.  2. Continued smoking against medical advice.  I have reviewed with her the      risks, benefits, and alternatives to the various therapies, and she      chose Chantix starter pack.  She needs to start it 7 days before her      quit date and see Dr. Micah Noel for the followup prescription if she is      successful in stopping smoking.   She asked me point blank does this mean I have chronic bronchitis or  emphysema.  I told her I would be happy to see her back for PFTs, but  rather than do more tests I think the key is to stop smoking and see what  clinical impact this has in terms of her symptoms, and at that point if she  still has symptoms, consider PFTs then.  Without smoking cessation there is  no way, even if we identified COPD, to change the natural history  effectively.  With smoking cessation, my hope is that she has no significant   chronic respiratory symptoms that need to be addressed further, but I would  be happy to see her back if needed.            ______________________________  Charlaine Dalton Sherene Sires, MD, [facility]      MBW/MedQ  DD:  11/05/2005  DT:  11/06/2005  Job #:  [id number]   cc:   Dr. Angelique Holm

## 2010-06-07 NOTE — Assessment & Plan Note (Signed)
[facility]                            CARDIOLOGY OFFICE NOTE   NAME:[name], Kimberly YONAN                        MRN:          [id number]  DATE:03/18/2006                            DOB:          March 13, 1945    ADDENDUM   We plan to change her from Vytorin 10/20 to simvastatin 40.     Cecil Cranker, MD, [facility]     EJL/MedQ  DD: 03/18/2006  DT: 03/18/2006  Job #: [id number]

## 2010-06-07 NOTE — Assessment & Plan Note (Signed)
[facility]                            CARDIOLOGY OFFICE NOTE   NAME:[name], Kimberly SIEDLECKI                        MRN:          [id number]  DATE:03/18/2006                            DOB:          10-Dec-1945    The patient is a very pleasant 65 year old white married female followed  here for nearly 40 years.  She has a history of congenital heart disease  with ventricular septal defect status post repair in 1958 and 21   INCOMPLETE     E. Graceann Congress, MD, [facility]  Electronically Signed    EJL/MedQ  DD: 03/18/2006  DT: 03/18/2006  Job #: [id number]

## 2010-06-07 NOTE — Assessment & Plan Note (Signed)
[facility]                            CARDIOLOGY OFFICE NOTE   NAME:[name], [name]                        MRN:          [id number]  DATE:12/09/2006                            DOB:          [birth year]    IDENTIFICATION:  [name] is a 65 year old woman who was previously  followed by Dr. Glennon Hamilton.  She has a history of VSD repair.  I saw  her in clinic a few weeks ago.   I called her today to touch base with her and her test results.  Echocardiogram looked fairly stable with an LVF 35-40.  Note there was  akinesis of the septum.  I compared it to the previous echo from a few  years ago, and there was no significant change.   The patient's lipids also looked good.  I encouraged her to stay on the  Zocor.   She still had some dizziness, and on review of the chart Dr. Gabriel Rung had  stopped her Diovan because of dizziness.  If her blood pressure would  tolerate it, I think being on Diovan would be a good thing with LV  dysfunction.  She will keep a log of her pressures and forward them to  clinic for review.  I would not change anything for now until I see  those.   She has had some chest pressure.  She has been under a lot of stress  over the past few days.  She says chronically for years she has had  chest pressure with stress.  She does not think this is different.  I  told her if things do not ease, of course we will need to evaluate  further.  Consider going to the emergency room if things get severe, but  will plan on continuing as current regimen for now unless there is a  change again.  She says this has been like this for years intermittently  with mental stress.     Pricilla Riffle, MD, [facility]  Electronically Signed    PVR/MedQ  DD: 12/09/2006  DT: 12/10/2006  Job #: [id number]

## 2010-06-07 NOTE — Assessment & Plan Note (Signed)
[facility]                            CARDIOLOGY OFFICE NOTE   NAME:Snodgrass, Kimberly [name]                        MRN:          [id number]  DATE:03/18/2006                            DOB:          22-Jan-1945    Patient is a very pleasant 65 year old, obese, white, married female  with history congenital heart disease with ventricular septal defect  repaired in 1958, and recurrence repaired in 1967 by Dr. Arnette Felts at  [facility].  Patient is a chronic smoker.  She has had mild LV dysfunction  with EF of 40% to 50%, hypokinesis of the anterior wall and septum by  echo in November 2006.   She has been followed by Dr. Sherene Sires, as she has COPD related to her  cigarettes.  She also has history of peripheral vascular disease,  hyperlipidemia, and hypertension.  In 2000 she had normal coronaries by  catheterization.   She is having no chest pain or palpitations at this time.   MEDICATIONS:  1. Aspirin 81.  2. Vytorin 10/20.  3. Lanoxin 0.0625 daily.  4. Lasix 5 mg daily.   Blood pressure 106/70.  Pulse 69.  Normal sinus rhythm.  GENERAL APPEARANCE:  Normal.  JVP is not elevated.  Carotid pulses palpable without bruits.  LUNGS:  Reveal no rales or rhonchi.  Some decrease in breath sounds.  CARDIAC EXAM:  Reveals 1-2/6 mid systolic murmur at the left sternal  border, the pulmonic area.  ABDOMINAL EXAM:  Unremarkable.  EXTREMITIES:  No edema.  EKG:  Reveals normal sinus rhythm, right bundle branch block, and  ectopic atrial rhythm rather than normal sinus rhythm.  This is  unchanged.   IMPRESSION:  1. Congenital heart disease with ventricular septal defect repaired      1958, and repeat repair in 1967.  2. Mild left ventricular dysfunction.  3. Chronic obstructive pulmonary disease.  4. Chronic cigarette abuse.  5. Systolic murmur left sternal border.   I have suggested a lipid profile, BMP, and followup 2D echo.  Patient  would like to continue coming to our  [facility] rather than  [facility].   I have suggested she see Dr. Dietrich Pates for followup.   ADDENDUM:  We plan to change her from Vytorin 10/20 to simvastatin 40.     Cecil Cranker, MD, [facility]  Electronically Signed    EJL/MedQ  DD: 03/18/2006  DT: 03/18/2006  Job #: [id number]

## 2010-06-20 ENCOUNTER — Ambulatory Visit (INDEPENDENT_AMBULATORY_CARE_PROVIDER_SITE_OTHER): Payer: Medicare Other | Admitting: Internal Medicine

## 2010-06-20 DIAGNOSIS — R0602 Shortness of breath: Secondary | ICD-10-CM

## 2010-06-20 LAB — PULMONARY FUNCTION TEST

## 2010-06-20 NOTE — Progress Notes (Signed)
PFT done today. 

## 2010-07-05 ENCOUNTER — Telehealth: Payer: Self-pay | Admitting: Internal Medicine

## 2010-07-05 NOTE — Telephone Encounter (Signed)
Per pt calling. Test result for  lung function test.

## 2010-07-05 NOTE — Telephone Encounter (Signed)
PT AWARE WILL FORWARD TO DR ROSS  PFT NOT BEEN REVIEWED AT THIS TIME./CY

## 2010-07-15 NOTE — Telephone Encounter (Signed)
I have reviewed PFTs  They are worse than in Aug 2011. I would recomm f/u in pulm If even with optimization of pulm/inhalers ,she is still symptomatic I would consider cath.

## 2010-07-15 NOTE — Telephone Encounter (Signed)
Called patient with results of PFT's. She states that she has been out of her inhaler medications for several months due to cost issues. She will follow up with Dr.Wert or Dr.Clance. I offered to make her the appointment but she declined. She states that she has seen the pulmonary doctors many times and will make her own appointment.  Will let Dr.Ross know.

## 2010-09-11 ENCOUNTER — Other Ambulatory Visit: Payer: Self-pay | Admitting: *Deleted

## 2010-09-11 MED ORDER — LISINOPRIL 5 MG PO TABS: 5.0000 mg | ORAL_TABLET | Freq: Every day | ORAL | Status: DC

## 2010-09-17 ENCOUNTER — Other Ambulatory Visit: Payer: Self-pay | Admitting: *Deleted

## 2010-09-17 MED ORDER — LISINOPRIL 5 MG PO TABS: 5.0000 mg | ORAL_TABLET | Freq: Every day | ORAL | Status: DC

## 2010-10-16 ENCOUNTER — Other Ambulatory Visit: Payer: Self-pay | Admitting: *Deleted

## 2010-10-16 MED ORDER — LISINOPRIL 5 MG PO TABS: 5.0000 mg | ORAL_TABLET | Freq: Every day | ORAL | Status: DC

## 2010-11-14 ENCOUNTER — Telehealth: Payer: Self-pay | Admitting: Internal Medicine

## 2010-11-14 NOTE — Telephone Encounter (Signed)
[facility] in [place], faxed request for refill of lisinopril 5 mg, no response, pls call, and pt needs the pharmacy changed to them

## 2010-11-15 ENCOUNTER — Other Ambulatory Visit: Payer: Self-pay | Admitting: *Deleted

## 2010-11-15 MED ORDER — LISINOPRIL 5 MG PO TABS: 5.0000 mg | ORAL_TABLET | Freq: Every day | ORAL | Status: DC

## 2010-11-18 ENCOUNTER — Encounter: Payer: Self-pay | Admitting: Internal Medicine

## 2010-11-18 ENCOUNTER — Ambulatory Visit (INDEPENDENT_AMBULATORY_CARE_PROVIDER_SITE_OTHER): Payer: Medicare Other | Admitting: Internal Medicine

## 2010-11-18 DIAGNOSIS — I119 Hypertensive heart disease without heart failure: Secondary | ICD-10-CM

## 2010-11-18 DIAGNOSIS — F17200 Nicotine dependence, unspecified, uncomplicated: Secondary | ICD-10-CM

## 2010-11-18 DIAGNOSIS — J449 Chronic obstructive pulmonary disease, unspecified: Secondary | ICD-10-CM

## 2010-11-18 DIAGNOSIS — E785 Hyperlipidemia, unspecified: Secondary | ICD-10-CM

## 2010-11-18 DIAGNOSIS — E7800 Pure hypercholesterolemia, unspecified: Secondary | ICD-10-CM

## 2010-11-18 DIAGNOSIS — Q210 Ventricular septal defect: Secondary | ICD-10-CM

## 2010-11-18 LAB — LIPID PANEL
Cholesterol: 173 mg/dL (ref 0–200)
HDL: 73.5 mg/dL (ref >39.00)
LDL Cholesterol: 82 mg/dL (ref 0–99)
Triglycerides: 87 mg/dL (ref 0.0–149.0)
VLDL: 17.4 mg/dL (ref 0.0–40.0)

## 2010-11-18 LAB — BASIC METABOLIC PANEL
BUN: 12 mg/dL (ref 6–23)
CO2: 26 mEq/L (ref 19–32)
Calcium: 9.2 mg/dL (ref 8.4–10.5)
GFR: 77.94 mL/min (ref >60.00)
Glucose, Bld: 88 mg/dL (ref 70–99)

## 2010-11-18 NOTE — Progress Notes (Signed)
HPI Patient is a 65 year old with a history of  congenital heart disease (s/p VSD repair), PVOD, dyslipidemia, COPD and tobacco use.  Since I saw her in clinic last she had a recent bronchitis.  Teated with ABX (Z pack)  Still feels SOB, cough (clear) Denies CP. She does continue to smoke.  She remains busy caring for grandchildren.  Allergies  Allergen Reactions  . Codeine     Current Outpatient Prescriptions  Medication Sig Dispense Refill  . albuterol (VENTOLIN HFA) 108 (90 BASE) MCG/ACT inhaler Inhale 2 puffs into the lungs every 6 (six) hours as needed.        . ALPRAZolam (XANAX) 0.25 MG tablet 1 tab 2 times per day as needed  10 tablet  0  . aspirin 81 MG tablet Take 81 mg by mouth daily.        . celecoxib (CELEBREX) 200 MG capsule Take 200 mg by mouth as needed.        . digoxin (LANOXIN) 0.125 MG tablet Take 62.5 mcg by mouth daily.        . furosemide (LASIX) 20 MG tablet Take 10 mg by mouth as needed.       Marland Kitchen lisinopril (PRINIVIL,ZESTRIL) 5 MG tablet Take 1 tablet (5 mg total) by mouth daily.  30 tablet  9  . simvastatin (ZOCOR) 40 MG tablet 1 tab daily        Past Medical History  Diagnosis Date  . VSD (ventricular septal defect)   . PVOD (pulmonary veno-occlusive disease)   . COPD (chronic obstructive pulmonary disease)   . Dyslipidemia   . Anxiety   . GERD (gastroesophageal reflux disease)   . Pericarditis   . Thoracic outlet syndrome     Past Surgical History  Procedure Date  . Vsd repair   . Tubal ligation   . Cholecystectomy     Family History  Problem Relation Age of Onset  . Throat cancer    . Hypertension    . Stroke      History   Social History  . Marital Status: Married    Spouse Name: N/A    Number of Children: N/A  . Years of Education: N/A   Occupational History  . Not on file.   Social History Main Topics  . Smoking status: Current Everyday Smoker -- 0.3 packs/day  . Smokeless tobacco: Not on file  . Alcohol Use: No  . Drug  Use: Not on file  . Sexually Active: Not on file   Other Topics Concern  . Not on file   Social History Narrative  . No narrative on file    Review of Systems:  All systems reviewed.  They are negative to the above problem except as previously stated.  Vital Signs: BP 111/71  Pulse 74  Ht 5\' 1"  (1.549 m)  Wt 135 lb (61.236 kg)  BMI 25.51 kg/m2  Physical Exam  HEENT:  Normocephalic, atraumatic. EOMI, PERRLA.  Neck: JVP is normal. No thyromegaly. No bruits.  Lungs: Decreased airflow. No rales.   no wheezes.  Heart: Regular rate and rhythm. Normal S1, S2. No S3.   II/VI systolic murmur. PMI not displaced.  Abdomen:  Supple, nontender. Normal bowel sounds. No masses. No hepatomegaly.  Extremities:   Good distal pulses throughout. No lower extremity edema.  Musculoskeletal :moving all extremities.  Neuro:   alert and oriented x3.  CN II-XII grossly intact.  EKG:  SR.  74 bpm.  RBBB>  Nonspecif  ST T wave changes.  Assessment and Plan:

## 2010-11-18 NOTE — Patient Instructions (Signed)
Lab work today.  We will call you with results.  Your physician wants you to follow-up in: MAY 2013 You will receive a reminder letter in the mail two months in advance. If you don't receive a letter, please call our office to schedule the follow-up appointment.

## 2010-11-20 NOTE — Assessment & Plan Note (Signed)
Counselled on quitting. 

## 2010-11-20 NOTE — Assessment & Plan Note (Signed)
Will review last echo.  NO changes for now.

## 2010-11-20 NOTE — Assessment & Plan Note (Signed)
Patinet is still not moving air well.  I would recomm  A steroid dose pack.  No ABX Counselled for 3 min on tobacco cessation.

## 2010-11-20 NOTE — Assessment & Plan Note (Signed)
Rel good control

## 2010-11-25 ENCOUNTER — Telehealth: Payer: Self-pay | Admitting: *Deleted

## 2010-11-25 DIAGNOSIS — I050 Rheumatic mitral stenosis: Secondary | ICD-10-CM

## 2010-11-25 NOTE — Telephone Encounter (Signed)
Called patient to let her know that she needs an echo in May 2013 when she sees Dr.Ross (same day). Will place in call backs.

## 2010-12-04 ENCOUNTER — Other Ambulatory Visit: Payer: Self-pay | Admitting: Internal Medicine

## 2010-12-04 NOTE — Telephone Encounter (Signed)
pls call after 11a, pt calling re getting a referral to PCP on [address], and requesting an rx for cough med, uses [facility] [phone]

## 2010-12-04 NOTE — Telephone Encounter (Signed)
Patient was called she is complaining of a productive cough,. coughing yellow phlegm worse at night.States Dr. Tenny Craw gave her prednisone and she finished it this past Friday and she is not any better .States she went to a urgent care in [place] and did not like the dr. She wanted tussionex and he would not prescribe due to high cost.Patient wanted to be referred to pcp on [address].was given Dr Debby Bud and Dr. Jonny Ruiz phone number. Advised to call and get established. Patient was told Dr. Tenny Craw out of office until fri and would like Dr.Ross to prescribe tussionex when she returns.

## 2010-12-08 NOTE — Telephone Encounter (Signed)
Fill Rx of Tussinex.  Make sure she has appt in medicine clnic.

## 2010-12-09 ENCOUNTER — Telehealth: Payer: Self-pay | Admitting: Internal Medicine

## 2010-12-09 MED ORDER — DEXTROMETHORPHAN-GUAIFENESIN 10-100 MG/5ML PO LIQD: 5.0000 mL | ORAL | Status: AC | PRN

## 2010-12-09 NOTE — Telephone Encounter (Signed)
Spoke to [facility]ist at [facility] [place] tussionex 1 tsp every 12 hrs.prn 8 oz.no refills called in.

## 2010-12-09 NOTE — Telephone Encounter (Signed)
Pt calling stating that she needs RX of [place] called into pharmacy for pt bronchitis. Pt said she spoke with nurse this morning and was told she would be calling in [place] however when pt called pharmacy the nurse had called in Robitussin DM, which pt can get over the counter. Please call RX of [place] into pharmacy.   Please return pt call to discuss further.

## 2010-12-09 NOTE — Telephone Encounter (Signed)
Cannot e prescribe tussionex,spoke to pharmacist at

## 2010-12-09 NOTE — Telephone Encounter (Signed)
Patient called tussionex sent to pharmacy. Patient advised to make appointment with primary care.

## 2010-12-19 NOTE — Telephone Encounter (Signed)
Please close encounter

## 2010-12-19 NOTE — Telephone Encounter (Signed)
Seen on 05/20/2010

## 2011-01-21 LAB — HM MAMMOGRAPHY: HM Mammogram: NEGATIVE

## 2011-01-24 ENCOUNTER — Telehealth: Payer: Self-pay | Admitting: Internal Medicine

## 2011-01-24 NOTE — Telephone Encounter (Signed)
New Problem:    Patient called because she wasn't sure if she would be able to continue taking her ALPRAZolam (XANAX) 0.25 MG tablet and her over the tusinex for her cough. She is out of both medications. Please advise.

## 2011-01-24 NOTE — Telephone Encounter (Signed)
Pt aware dr Tenny Craw / Annice Pih rn out of office till next week. Pt has establish app with Dr Jonny Ruiz to be her PCP 02/26/11. She would like to know if she can have refills of tussinex and Xanax to get her to her app. Made her aware Dr Tenny Craw usually only handles cardiac meds but wanted her to be asked anyway. Please call with answer.

## 2011-01-26 NOTE — Telephone Encounter (Signed)
I can refill short course of both.  Not back into clinic until later in week.

## 2011-01-27 MED ORDER — ALPRAZOLAM 0.25 MG PO TABS: ORAL_TABLET | ORAL | Status: DC

## 2011-01-27 NOTE — Telephone Encounter (Signed)
Called patient and advised OK for refill for Tussinex Cough Syrup and Xanax one time each per Dr.Ross. Called pharmacy with verbal order.

## 2011-02-22 ENCOUNTER — Encounter: Payer: Self-pay | Admitting: Internal Medicine

## 2011-02-22 DIAGNOSIS — F419 Anxiety disorder, unspecified: Secondary | ICD-10-CM | POA: Insufficient documentation

## 2011-02-22 DIAGNOSIS — Z0000 Encounter for general adult medical examination without abnormal findings: Secondary | ICD-10-CM | POA: Insufficient documentation

## 2011-02-22 DIAGNOSIS — I10 Essential (primary) hypertension: Secondary | ICD-10-CM | POA: Insufficient documentation

## 2011-02-22 DIAGNOSIS — G540 Brachial plexus disorders: Secondary | ICD-10-CM | POA: Insufficient documentation

## 2011-02-22 DIAGNOSIS — I319 Disease of pericardium, unspecified: Secondary | ICD-10-CM | POA: Insufficient documentation

## 2011-02-22 DIAGNOSIS — K219 Gastro-esophageal reflux disease without esophagitis: Secondary | ICD-10-CM | POA: Insufficient documentation

## 2011-02-22 HISTORY — DX: Essential (primary) hypertension: I10

## 2011-02-26 ENCOUNTER — Ambulatory Visit (INDEPENDENT_AMBULATORY_CARE_PROVIDER_SITE_OTHER): Payer: Medicare Other | Admitting: Internal Medicine

## 2011-02-26 ENCOUNTER — Ambulatory Visit (INDEPENDENT_AMBULATORY_CARE_PROVIDER_SITE_OTHER)
Admission: RE | Admit: 2011-02-26 | Discharge: 2011-02-26 | Disposition: A | Discharge status: Home / Self Care | Payer: Medicare Other | Source: Ambulatory Visit | Attending: Internal Medicine | Admitting: Internal Medicine

## 2011-02-26 ENCOUNTER — Encounter: Payer: Self-pay | Admitting: Internal Medicine

## 2011-02-26 VITALS — BP 108/68 | HR 84 | Temp 97.8°F | Ht 60.0 in | Wt 136.4 lb

## 2011-02-26 DIAGNOSIS — G43909 Migraine, unspecified, not intractable, without status migrainosus: Secondary | ICD-10-CM | POA: Insufficient documentation

## 2011-02-26 DIAGNOSIS — R05 Cough: Secondary | ICD-10-CM | POA: Insufficient documentation

## 2011-02-26 DIAGNOSIS — Z0000 Encounter for general adult medical examination without abnormal findings: Secondary | ICD-10-CM

## 2011-02-26 DIAGNOSIS — G8929 Other chronic pain: Secondary | ICD-10-CM | POA: Insufficient documentation

## 2011-02-26 DIAGNOSIS — F411 Generalized anxiety disorder: Secondary | ICD-10-CM

## 2011-02-26 DIAGNOSIS — M542 Cervicalgia: Secondary | ICD-10-CM | POA: Insufficient documentation

## 2011-02-26 DIAGNOSIS — F419 Anxiety disorder, unspecified: Secondary | ICD-10-CM

## 2011-02-26 MED ORDER — CITALOPRAM HYDROBROMIDE 10 MG PO TABS: 10.0000 mg | ORAL_TABLET | Freq: Every day | ORAL | Status: DC

## 2011-02-26 MED ORDER — ALPRAZOLAM 0.25 MG PO TABS: ORAL_TABLET | ORAL | Status: DC

## 2011-02-26 NOTE — Assessment & Plan Note (Signed)
Chronic recurrent ? Worse - for cxr

## 2011-02-26 NOTE — Progress Notes (Signed)
Subjective:    Patient ID: Kimberly [name], female    DOB: 1945/09/25, 66 y.o.   MRN: [id number]  HPI  Here as new pt to establish,  Needs anxiety med tx/refills, trying to quit smoking with electronic cig's, has ongoing mult stressors, does not want couseling at [facility].  Has had ? mild worsening depressive symptoms, but no suicidal ideation, or panic.  Did see a psychiatrist breifly after mother died and she lost 25 lbs in 1 month,  tx with prozac and seemed to help.  Here for wellness;  Overall doing ok;  Pt denies CP, worsening SOB, DOE, wheezing, orthopnea, PND, worsening LE edema, palpitations, dizziness or syncope.  Pt denies neurological change such as new Headache, facial or extremity weakness.  Pt denies polydipsia, polyuria, or low sugar symptoms. Pt states overall good compliance with treatment and medications, good tolerability, and trying to follow lower cholesterol diet. . No fever, wt loss, night sweats, loss of appetite, or other constitutional symptoms.  Pt states good ability with ADL's, low fall risk, home safety reviewed and adequate, no significant changes in hearing or vision, and occasionally active with exercise.  Does have chronic recurrent neck pain, sounds like cervical disc dz, controlled with nsaid prn/celebrex, sees orthopedic Past Medical History  Diagnosis Date  . VSD (ventricular septal defect)   . PVOD (pulmonary veno-occlusive disease)   . COPD (chronic obstructive pulmonary disease)   . Dyslipidemia   . Anxiety   . GERD (gastroesophageal reflux disease)   . Pericarditis   . Thoracic outlet syndrome   . HTN (hypertension) 02/22/2011  . Asthma   . Blood in stool   . Ulcer   . Migraines   . Migraine 02/26/2011   Past Surgical History  Procedure Date  . Vsd repair   . Tubal ligation   . Cholecystectomy   . Cesarean section     reports that she has been smoking.  She does not have any smokeless tobacco history on file. She reports that she does not drink  alcohol or use illicit drugs. family history includes Alcohol abuse in her other; Arthritis in her others; Cancer in her other; Hypertension in her other and unspecified family member; Stroke in her other and unspecified family member; and Throat cancer in an unspecified family member. Allergies  Allergen Reactions  . Codeine    Current Outpatient Prescriptions on File Prior to Visit  Medication Sig Dispense Refill  . albuterol (VENTOLIN HFA) 108 (90 BASE) MCG/ACT inhaler Inhale 2 puffs into the lungs every 6 (six) hours as needed.        . ALPRAZolam (XANAX) 0.25 MG tablet 1 tab 2 times per day as needed  10 tablet  0  . aspirin 81 MG tablet Take 81 mg by mouth daily.        . celecoxib (CELEBREX) 200 MG capsule Take 200 mg by mouth as needed.        . digoxin (LANOXIN) 0.125 MG tablet Take 62.5 mcg by mouth daily.        . furosemide (LASIX) 20 MG tablet Take 10 mg by mouth as needed.       Marland Kitchen lisinopril (PRINIVIL,ZESTRIL) 5 MG tablet Take 1 tablet (5 mg total) by mouth daily.  30 tablet  9  . simvastatin (ZOCOR) 40 MG tablet 1 tab daily       Review of Systems Review of Systems  Constitutional: Negative for diaphoresis, activity change, appetite change and unexpected weight change.  HENT: Negative for hearing loss, ear pain, facial swelling, mouth sores and neck stiffness.   Eyes: Negative for pain, redness and visual disturbance.  Respiratory: Negative for shortness of breath and wheezing.   Cardiovascular: Negative for chest pain and palpitations.  Gastrointestinal: Negative for diarrhea, blood in stool, abdominal distention and rectal pain.  Genitourinary: Negative for hematuria, flank pain and decreased urine volume.  Musculoskeletal: Negative for myalgias and joint swelling.  Skin: Negative for color change and wound.  Neurological: Negative for syncope and numbness.  Hematological: Negative for adenopathy.  Psychiatric/Behavioral: Negative for hallucinations, self-injury,  decreased concentration and agitation.      Objective:   Physical Exam BP 108/68  Pulse 84  Temp(Src) 97.8 F (36.6 C) (Oral)  Ht 5' (1.524 m)  Wt 136 lb 6 oz (61.859 kg)  BMI 26.63 kg/m2  SpO2 97% Physical Exam  VS noted Constitutional: Pt is oriented to person, place, and time. Appears well-developed and well-nourished.  HENT:  Head: Normocephalic and atraumatic.  Right Ear: External ear normal.  Left Ear: External ear normal.  Nose: Nose normal.  Mouth/Throat: Oropharynx is clear and moist.  Eyes: Conjunctivae and EOM are normal. Pupils are equal, round, and reactive to light.  Neck: Normal range of motion. Neck supple. No JVD present. No tracheal deviation present.  Cardiovascular: Normal rate, regular rhythm, normal heart sounds and intact distal pulses.   Pulmonary/Chest: Effort normal and breath sounds normal.  Abdominal: Soft. Bowel sounds are normal. There is no tenderness.  Musculoskeletal: Normal range of motion. Exhibits no edema.  Lymphadenopathy:  Has no cervical adenopathy.  Neurological: Pt is alert and oriented to person, place, and time. Pt has normal reflexes. No cranial nerve deficit.  Skin: Skin is warm and dry. No rash noted.  Psychiatric:  Has  normal mood and affect. Behavior is normal. not depressed affect but 2+ nervous    Assessment & Plan:

## 2011-02-26 NOTE — Assessment & Plan Note (Signed)
To add citalopram 10 qd, to cont the xanax prn,  to f/u any worsening symptoms or concerns

## 2011-02-26 NOTE — Assessment & Plan Note (Signed)
Overall doing well, age appropriate education and counseling updated, referrals for preventative services and immunizations addressed, dietary and smoking counseling addressed, most recent labs and ECG reviewed.  I have personally reviewed and have noted: 1) the patient's medical and social history 2) The pt's use of alcohol, tobacco, and illicit drugs 3) The patient's current medications and supplements 4) Functional ability including ADL's, fall risk, home safety risk, hearing and visual impairment 5) Diet and physical activities 6) Evidence for depression or mood disorder 7) The patient's height, weight, and BMI have been recorded in the chart I have made referrals, and provided counseling and education based on review of the above Declines labs or colonscopy at this time

## 2011-02-26 NOTE — Patient Instructions (Addendum)
Please remember to followup with your GYN for the yearly pap smear and/or mammogram Take all new medications as prescribed - the citalopram for nerves Continue all other medications as before, and you are given the xanax refill today Please have the pharmacy call if you need further refills Please go to XRAY in the Basement for the x-ray test Please call the phone number [phone] (the PhoneTree System) for results of testing in 2-3 days;  When calling, simply dial the number, and when prompted enter the MRN number above (the Medical Record Number) and the # key, then the message should start. Please return in 1 year for your yearly visit, or sooner if needed, with Lab testing done 3-5 days before

## 2011-03-02 ENCOUNTER — Encounter: Payer: Self-pay | Admitting: Internal Medicine

## 2011-04-29 ENCOUNTER — Ambulatory Visit: Payer: Self-pay | Admitting: Family Medicine

## 2011-06-23 ENCOUNTER — Other Ambulatory Visit: Payer: Self-pay | Admitting: Internal Medicine

## 2011-06-23 NOTE — Telephone Encounter (Signed)
Refill- Albuterol  Verified preferred pharmacy [facility]

## 2011-07-14 ENCOUNTER — Ambulatory Visit (INDEPENDENT_AMBULATORY_CARE_PROVIDER_SITE_OTHER): Payer: Medicare Other | Admitting: Internal Medicine

## 2011-07-14 ENCOUNTER — Encounter: Payer: Self-pay | Admitting: Internal Medicine

## 2011-07-14 VITALS — BP 109/67 | HR 70 | Ht 60.0 in | Wt 134.0 lb

## 2011-07-14 DIAGNOSIS — Q210 Ventricular septal defect: Secondary | ICD-10-CM

## 2011-07-14 NOTE — Patient Instructions (Signed)
Your physician has requested that you have an echocardiogram. Echocardiography is a painless test that uses sound waves to create images of your heart. It provides your doctor with information about the size and shape of your heart and how well your heart's chambers and valves are working. This procedure takes approximately one hour. There are no restrictions for this procedure.  Schedule with VANESSA in a AM appointment.  Your physician wants you to follow-up in:6 months You will receive a reminder letter in the mail two months in advance. If you don't receive a letter, please call our office to schedule the follow-up appointment.

## 2011-07-14 NOTE — Progress Notes (Signed)
HPI Patient is a 66 year old with a history of congenital heart disease (s/p VSD repair), PVOD, dyslipidemia, COPD and tobacco use. \ I saw her in clknic in October 2012.  At the time she had a COPD exacerbation.  I recomm a steroid dose pack. Lipids and BMET in [place] were good. Since seen her breathing is OK  Did get SOB while walking [place].  Does not have signif problem on flat ground  Allergies  Allergen Reactions  . Codeine     Current Outpatient Prescriptions  Medication Sig Dispense Refill  . albuterol (VENTOLIN HFA) 108 (90 BASE) MCG/ACT inhaler Inhale 2 puffs into the lungs every 6 (six) hours as needed.        . ALPRAZolam (XANAX) 0.25 MG tablet 1/2 - 1 tab by mouth twice per day as needed for stress  60 tablet  5  . aspirin 81 MG tablet Take 81 mg by mouth daily.        . celecoxib (CELEBREX) 200 MG capsule Take 200 mg by mouth as needed.        . citalopram (CELEXA) 10 MG tablet Take 1 tablet (10 mg total) by mouth daily.  90 tablet  3  . digoxin (LANOXIN) 0.125 MG tablet Take 62.5 mcg by mouth daily.        . furosemide (LASIX) 20 MG tablet Take 10 mg by mouth as needed.       Marland Kitchen lisinopril (PRINIVIL,ZESTRIL) 5 MG tablet Take 1 tablet (5 mg total) by mouth daily.  30 tablet  9  . simvastatin (ZOCOR) 40 MG tablet 1 tab daily        Past Medical History  Diagnosis Date  . VSD (ventricular septal defect)   . PVOD (pulmonary veno-occlusive disease)   . COPD (chronic obstructive pulmonary disease)   . Dyslipidemia   . Anxiety   . GERD (gastroesophageal reflux disease)   . Pericarditis   . Thoracic outlet syndrome   . HTN (hypertension) 02/22/2011  . Asthma   . Blood in stool   . Ulcer   . Migraines   . Migraine 02/26/2011    Past Surgical History  Procedure Date  . Vsd repair   . Tubal ligation   . Cholecystectomy   . Cesarean section     Family History  Problem Relation Age of Onset  . Throat cancer    . Hypertension    . Stroke    .  Alcohol abuse Other   . Arthritis Other   . Cancer Other     lung cancer  . Hypertension Other   . Arthritis Other   . Stroke Other     History   Social History  . Marital Status: Married    Spouse Name: N/A    Number of Children: N/A  . Years of Education: 12   Occupational History  . retired    Social History Main Topics  . Smoking status: Current Everyday Smoker -- 0.3 packs/day  . Smokeless tobacco: Not on file  . Alcohol Use: No  . Drug Use: No  . Sexually Active: Not on file   Other Topics Concern  . Not on file   Social History Narrative  . No narrative on file    Review of Systems:  All systems reviewed.  They are negative to the above problem except as previously stated.  Vital Signs: BP 109/67  Pulse 70  Ht 5' (1.524 m)  Wt 134  lb (60.782 kg)  BMI 26.17 kg/m2  Physical Exam Patient is in NAD O2 sat on walking 95% HEENT:  Normocephalic, atraumatic. EOMI, PERRLA.  Neck: JVP is normal. No thyromegaly. No bruits.  Lungs: Some decreased airflow.  Positive rhonchi. No rales or wheezes.  Heart: Regular rate and rhythm. Normal S1, S2. No S3.   II/VI  significant murmur. PMI not displaced.  Abdomen:  Supple, nontender. Normal bowel sounds. No masses. No hepatomegaly.  Extremities:   Good distal pulses throughout. No lower extremity edema.  Musculoskeletal :moving all extremities.  Neuro:   alert and oriented x3.  CN II-XII grossly intact.  EKG:  Ectopic atrial rhythm  70 bpm.  RBBB> Assessment and Plan:  1.  VSD. S/p repair.  Will repeat echo  2.  COPD  Stable  COunselled on smoking cessation.  Patient not ready to quit fully  3.  HL  Continue regimen

## 2011-07-21 ENCOUNTER — Other Ambulatory Visit (HOSPITAL_COMMUNITY): Payer: Medicare Other

## 2011-07-23 ENCOUNTER — Ambulatory Visit (HOSPITAL_COMMUNITY): Payer: Medicare Other | Attending: Cardiology | Admitting: Radiology

## 2011-07-23 DIAGNOSIS — R011 Cardiac murmur, unspecified: Secondary | ICD-10-CM | POA: Insufficient documentation

## 2011-07-23 DIAGNOSIS — I10 Essential (primary) hypertension: Secondary | ICD-10-CM | POA: Insufficient documentation

## 2011-07-23 DIAGNOSIS — J4489 Other specified chronic obstructive pulmonary disease: Secondary | ICD-10-CM | POA: Insufficient documentation

## 2011-07-23 DIAGNOSIS — I517 Cardiomegaly: Secondary | ICD-10-CM | POA: Insufficient documentation

## 2011-07-23 DIAGNOSIS — E785 Hyperlipidemia, unspecified: Secondary | ICD-10-CM | POA: Insufficient documentation

## 2011-07-23 DIAGNOSIS — I510 Cardiac septal defect, acquired: Secondary | ICD-10-CM | POA: Insufficient documentation

## 2011-07-23 DIAGNOSIS — Q210 Ventricular septal defect: Secondary | ICD-10-CM

## 2011-07-23 DIAGNOSIS — F17200 Nicotine dependence, unspecified, uncomplicated: Secondary | ICD-10-CM | POA: Insufficient documentation

## 2011-07-23 DIAGNOSIS — J449 Chronic obstructive pulmonary disease, unspecified: Secondary | ICD-10-CM | POA: Insufficient documentation

## 2011-07-23 NOTE — Progress Notes (Signed)
Echocardiogram performed.  

## 2011-09-01 ENCOUNTER — Other Ambulatory Visit: Payer: Self-pay | Admitting: Internal Medicine

## 2011-09-01 NOTE — Telephone Encounter (Signed)
Done hardcopy to robin  

## 2011-09-01 NOTE — Telephone Encounter (Signed)
Faxed hardcopy to pharmacy. 

## 2011-10-10 ENCOUNTER — Telehealth: Payer: Self-pay

## 2011-10-10 MED ORDER — AMOXICILLIN 500 MG PO CAPS: ORAL_CAPSULE | ORAL | Status: DC

## 2011-10-10 NOTE — Telephone Encounter (Signed)
The patient called to inform she is having dental work on Tuesday and would like an antibiotic sent in. She stated she has had open heart surgury two times and her Cardiologist has informed her of the need for this.

## 2011-10-10 NOTE — Telephone Encounter (Signed)
Patient informed. 

## 2011-10-10 NOTE — Telephone Encounter (Signed)
Done erx 

## 2011-12-04 ENCOUNTER — Other Ambulatory Visit: Payer: Self-pay | Admitting: Internal Medicine

## 2011-12-04 NOTE — Telephone Encounter (Signed)
Fax Received. Refill Completed. Kimberly [name] (R.M.A)   

## 2011-12-08 ENCOUNTER — Ambulatory Visit: Payer: Medicare Other | Admitting: Internal Medicine

## 2011-12-17 ENCOUNTER — Other Ambulatory Visit: Payer: Self-pay | Admitting: Internal Medicine

## 2011-12-22 ENCOUNTER — Ambulatory Visit: Payer: Self-pay | Admitting: Family Medicine

## 2012-02-06 ENCOUNTER — Telehealth: Payer: Self-pay | Admitting: Internal Medicine

## 2012-02-06 NOTE — Telephone Encounter (Addendum)
Patient states that she saw Dr.Hawkins in December for bronchitis and was treated with Levaquin and Prednisone for 7 days. On Monday she went back to see him again because she was coughing again so he gave her a Zpack with more oral Prednisone. She states that she has some chest pain with coughing and thinks she needs an injection of Prednisone like she has had in the past. Not sure if Dr.Hawkins does injections in the office. Advised her to call his office and find out, and if not she will see if Dr.Wert or the PA can see her in the Pulmonary Dept. Will let Dr.Ross know that she called.

## 2012-02-06 NOTE — Telephone Encounter (Signed)
Pt wants to talk to you about the medication she is taking for her brochitis that her pcp has put her on she has been sick for a while but she wants to get Dr. Tenny Craw advice because she can't see Dr. Sherene Sires for at least a month

## 2012-02-11 ENCOUNTER — Encounter: Payer: Self-pay | Admitting: Internal Medicine

## 2012-02-11 ENCOUNTER — Ambulatory Visit (INDEPENDENT_AMBULATORY_CARE_PROVIDER_SITE_OTHER): Payer: Medicare Other | Admitting: Internal Medicine

## 2012-02-11 ENCOUNTER — Ambulatory Visit (INDEPENDENT_AMBULATORY_CARE_PROVIDER_SITE_OTHER)
Admission: RE | Admit: 2012-02-11 | Discharge: 2012-02-11 | Disposition: A | Discharge status: Home / Self Care | Payer: Medicare Other | Source: Ambulatory Visit | Attending: Internal Medicine | Admitting: Internal Medicine

## 2012-02-11 VITALS — BP 142/78 | HR 93 | Temp 98.2°F | Ht 61.0 in | Wt 141.4 lb

## 2012-02-11 DIAGNOSIS — I420 Dilated cardiomyopathy: Secondary | ICD-10-CM | POA: Insufficient documentation

## 2012-02-11 DIAGNOSIS — E8779 Other fluid overload: Secondary | ICD-10-CM

## 2012-02-11 DIAGNOSIS — J449 Chronic obstructive pulmonary disease, unspecified: Secondary | ICD-10-CM

## 2012-02-11 DIAGNOSIS — I10 Essential (primary) hypertension: Secondary | ICD-10-CM

## 2012-02-11 DIAGNOSIS — E8770 Fluid overload, unspecified: Secondary | ICD-10-CM

## 2012-02-11 DIAGNOSIS — J4489 Other specified chronic obstructive pulmonary disease: Secondary | ICD-10-CM

## 2012-02-11 NOTE — Assessment & Plan Note (Signed)
Mild worsening on prednisone likely the reason it seems and likely the reason for the most recently worsening sob/doe, to d/c the prednisone, ok to take lasix 40 mg total today (2 of her 20's), then incr lasix to 20 qd from qod until sees Dr Ross/card in 5 days, also for cxr today; pt should weight herself daily, may need f/u bmet when seen monday

## 2012-02-11 NOTE — Assessment & Plan Note (Signed)
Suspect prob stable/improved, ok to stop prednisone, Please continue all other medications as before

## 2012-02-11 NOTE — Progress Notes (Signed)
Subjective:    Patient ID: Kimberly [name], female    DOB: [birth year], 67 y.o.   MRN: [id number]  HPI  Here to f/u "frustrated and mad" that she cant seem to finally get over being sob worse in the past month; did have what sounds like typical acute infection and wheezing tx per Dr hawkins/local Sidney Ace MD about 4 wks ago, tx with antibx and prednisone to which she improved, then worse again, with re-tx with antibx recently (first a zpack, then levaquin course) did not seem to help as well, in fact in the past wk states has gained 6 lbs and now with puffy feet, wondering if she needs to drink more fluids to "flush it out."  Has been seeing Dr Ross/card and incidentally has appt in 5 days.  Still have cough but now nonprod, still wheezing maybe worse in the past wk with DOE but no fever or pain.  Has known hx of VSD and echo recent with EF 35-45%.  Has been doing relatively stable on current meds including good compliance with lasix 20 mg - 1/2 qod.  Also hx of pericarditis, Pt denies chest pain,  orthopnea, PND palpitations, dizziness or syncope. Past Medical History  Diagnosis Date  . VSD (ventricular septal defect)   . PVOD (pulmonary veno-occlusive disease)   . COPD (chronic obstructive pulmonary disease)   . Dyslipidemia   . Anxiety   . GERD (gastroesophageal reflux disease)   . Pericarditis   . Thoracic outlet syndrome   . HTN (hypertension) 02/22/2011  . Asthma   . Blood in stool   . Ulcer   . Migraines   . Migraine 02/26/2011   Past Surgical History  Procedure Date  . Vsd repair   . Tubal ligation   . Cholecystectomy   . Cesarean section     reports that she has been smoking.  She does not have any smokeless tobacco history on file. She reports that she does not drink alcohol or use illicit drugs. family history includes Alcohol abuse in her other; Arthritis in her others; Cancer in her other; Hypertension in her other and unspecified family member; Stroke in her other and  unspecified family member; and Throat cancer in an unspecified family member. Allergies  Allergen Reactions  . Codeine    Current Outpatient Prescriptions on File Prior to Visit  Medication Sig Dispense Refill  . albuterol (VENTOLIN HFA) 108 (90 BASE) MCG/ACT inhaler Inhale 2 puffs into the lungs every 6 (six) hours as needed.        . ALPRAZolam (XANAX) 0.25 MG tablet TAKE 1/2 TO 1 TABLET BY MOUTH TWICE     DAILY AS NEEDED FOR STRESS  60 tablet  5  . aspirin 81 MG tablet Take 81 mg by mouth daily.        . citalopram (CELEXA) 10 MG tablet Take 1 tablet (10 mg total) by mouth daily.  90 tablet  3  . furosemide (LASIX) 20 MG tablet Take 10 mg by mouth as needed.       Marland Kitchen LANOXIN 0.125 MG tablet TAKE 1/2 TABLET BY MOUTH ONCE DAILY AS  DIRECTED.  30 tablet  5  . lisinopril (PRINIVIL,ZESTRIL) 5 MG tablet TAKE ONE (1) TABLET EACH DAY  30 tablet  3  . simvastatin (ZOCOR) 40 MG tablet 1 tab daily       Review of Systems  Constitutional: Negative for unexpected weight change, or unusual diaphoresis  HENT: Negative for tinnitus.   Eyes: Negative  for photophobia and visual disturbance.  Respiratory: Negative for choking and stridor.   Gastrointestinal: Negative for vomiting and blood in stool.  Genitourinary: Negative for hematuria and decreased urine volume.  Musculoskeletal: Negative for acute joint swelling Skin: Negative for color change and wound.  Neurological: Negative for tremors and numbness other than noted  Psychiatric/Behavioral: Negative for decreased concentration or  hyperactivity.       Objective:   Physical Exam BP 142/78  Pulse 93  Temp 98.2 F (36.8 C) (Oral)  Ht 5\' 1"  (1.549 m)  Wt 141 lb 6 oz (64.127 kg)  BMI 26.71 kg/m2  SpO2 95% VS noted, not ill appearing but fatigued Constitutional: Pt appears well-developed and well-nourished.  HENT: Head: NCAT.  Right Ear: External ear normal.  Left Ear: External ear normal.  Eyes: Conjunctivae and EOM are normal. Pupils  are equal, round, and reactive to light.  Neck: Normal range of motion. Neck supple.  Cardiovascular: Normal rate and regular rhythm.   Pulmonary/Chest: Effort normal and breath sounds decreased but without overt rales or wheezing Neurological: Pt is alert. Not confused , motor gross intact Skin: Skin is warm. No erythema. trace to 1+ ankle edema bilat noted Psychiatric: Pt behavior is normal. Thought content normal. mild nervous    Assessment & Plan:

## 2012-02-11 NOTE — Patient Instructions (Addendum)
Please increase the lasix to 40 mg today only, then 20 mg per day for the next few days until you cardiology on Monday Please continue all other medications as before, except ok to stop the prednisone Remember to weigh yourself every day in the AM and write them down, bring to Dr Tenny Craw on monday Please go to the XRAY Department in the Basement (go straight as you get off the elevator) for the x-ray testing You will be contacted by phone if any changes need to be made immediately.  Otherwise, you will receive a letter about your results with an explanation, but please check with MyChart first. Thank you for enrolling in MyChart. Please follow the instructions below to securely access your online medical record. MyChart allows you to send messages to your doctor, view your test results, renew your prescriptions, schedule appointments, and more. To Log into My Chart online, please go by [employer] or [employer] to [email or web address], or download the MyChart App from the [employer] of [employer] .  Your Username is: [email or web address] Please send a practice Message on Mychart later today.

## 2012-02-11 NOTE — Assessment & Plan Note (Signed)
Mild elev today with volume mild incresaed, o/w stable overall by history and exam, recent data reviewed with pt, and pt to continue medical treatment as before,  to f/u any worsening symptoms or concerns BP Readings from Last 3 Encounters:  02/11/12 142/78  07/14/11 109/67  02/26/11 108/68

## 2012-02-16 ENCOUNTER — Encounter: Payer: Self-pay | Admitting: Internal Medicine

## 2012-02-16 ENCOUNTER — Ambulatory Visit (INDEPENDENT_AMBULATORY_CARE_PROVIDER_SITE_OTHER): Payer: Medicare Other | Admitting: Internal Medicine

## 2012-02-16 VITALS — BP 115/77 | HR 78 | Ht 61.0 in | Wt 132.0 lb

## 2012-02-16 DIAGNOSIS — I10 Essential (primary) hypertension: Secondary | ICD-10-CM

## 2012-02-16 DIAGNOSIS — J449 Chronic obstructive pulmonary disease, unspecified: Secondary | ICD-10-CM

## 2012-02-16 DIAGNOSIS — E785 Hyperlipidemia, unspecified: Secondary | ICD-10-CM

## 2012-02-16 DIAGNOSIS — Q210 Ventricular septal defect: Secondary | ICD-10-CM

## 2012-02-16 LAB — CBC WITH DIFFERENTIAL/PLATELET
Basophils Absolute: 0.1 10*3/uL (ref 0.0–0.1)
Eosinophils Absolute: 0.1 10*3/uL (ref 0.0–0.7)
Eosinophils Relative: 0.8 % (ref 0.0–5.0)
HCT: 49.2 % — ABNORMAL HIGH (ref 36.0–46.0)
Lymphs Abs: 2.9 10*3/uL (ref 0.7–4.0)
MCV: 89.6 fl (ref 78.0–100.0)
Monocytes Absolute: 0.7 10*3/uL (ref 0.1–1.0)
Neutrophils Relative %: 62.3 % (ref 43.0–77.0)
Platelets: 345 10*3/uL (ref 150.0–400.0)
RDW: 14.1 % (ref 11.5–14.6)
WBC: 9.9 10*3/uL (ref 4.5–10.5)

## 2012-02-16 LAB — BRAIN NATRIURETIC PEPTIDE: Pro B Natriuretic peptide (BNP): 54 pg/mL (ref 0.0–100.0)

## 2012-02-16 LAB — BASIC METABOLIC PANEL
BUN: 21 mg/dL (ref 6–23)
CO2: 31 mEq/L (ref 19–32)
Calcium: 9.4 mg/dL (ref 8.4–10.5)
Chloride: 95 mEq/L — ABNORMAL LOW (ref 96–112)
Creatinine, Ser: 1 mg/dL (ref 0.4–1.2)

## 2012-02-16 LAB — LDL CHOLESTEROL, DIRECT: Direct LDL: 154.1 mg/dL

## 2012-02-16 LAB — LIPID PANEL: Cholesterol: 274 mg/dL — ABNORMAL HIGH (ref 0–200)

## 2012-02-16 MED ORDER — DIGOXIN 125 MCG PO TABS: 0.1250 mg | ORAL_TABLET | Freq: Every day | ORAL | Status: DC

## 2012-02-16 MED ORDER — FUROSEMIDE 20 MG PO TABS: 20.0000 mg | ORAL_TABLET | ORAL | Status: DC | PRN

## 2012-02-16 MED ORDER — LISINOPRIL 5 MG PO TABS: 5.0000 mg | ORAL_TABLET | Freq: Every day | ORAL | Status: DC

## 2012-02-16 NOTE — Progress Notes (Signed)
HPI Patient is a 67 year old with a history of congenital heart disease (s/p VSD repair), PVOD, dyslipidemia, COPD and tobacco use. \  [place] 2013 Echo in July showqed LV and RV function were stable.  LVEF was 35 to 40% with akinessi of there anteroseptum  RV was normal. SInce seen her biggest complaint is recently episodes of bronchitis  She has been seen by Dr. Juanetta Gosling in [place]  Has been through 2 courses of antibiotics.  Initally was treated with antibiotics and steroids.  After this course remained SOB and becamie swollen.  Was then treated with Abx and lasix.  About to run out of lasix. Swelling is almost gone  Now reports no fever.  Sputum is clear.  Breathing is but not at baseline  SHe is trying to quit smoking  Has stopped for several days at a time.   Allergies  Allergen Reactions  . Codeine     Current Outpatient Prescriptions  Medication Sig Dispense Refill  . albuterol (VENTOLIN HFA) 108 (90 BASE) MCG/ACT inhaler Inhale 2 puffs into the lungs every 6 (six) hours as needed.        . ALPRAZolam (XANAX) 0.25 MG tablet TAKE 1/2 TO 1 TABLET BY MOUTH TWICE     DAILY AS NEEDED FOR STRESS  60 tablet  5  . aspirin 81 MG tablet Take 81 mg by mouth daily.        . citalopram (CELEXA) 10 MG tablet Take 1 tablet (10 mg total) by mouth daily.  90 tablet  3  . furosemide (LASIX) 20 MG tablet Take 10 mg by mouth as needed.       Marland Kitchen LANOXIN 0.125 MG tablet TAKE 1/2 TABLET BY MOUTH ONCE DAILY AS  DIRECTED.  30 tablet  5  . lisinopril (PRINIVIL,ZESTRIL) 5 MG tablet TAKE ONE (1) TABLET EACH DAY  30 tablet  3  . simvastatin (ZOCOR) 40 MG tablet 1 tab daily        Past Medical History  Diagnosis Date  . VSD (ventricular septal defect)   . PVOD (pulmonary veno-occlusive disease)   . COPD (chronic obstructive pulmonary disease)   . Dyslipidemia   . Anxiety   . GERD (gastroesophageal reflux disease)   . Pericarditis   . Thoracic outlet syndrome   . HTN (hypertension)  02/22/2011  . Asthma   . Blood in stool   . Ulcer   . Migraines   . Migraine 02/26/2011    Past Surgical History  Procedure Date  . Vsd repair   . Tubal ligation   . Cholecystectomy   . Cesarean section     Family History  Problem Relation Age of Onset  . Throat cancer    . Hypertension    . Stroke    . Alcohol abuse Other   . Arthritis Other   . Cancer Other     lung cancer  . Hypertension Other   . Arthritis Other   . Stroke Other     History   Social History  . Marital Status: Married    Spouse Name: N/A    Number of Children: N/A  . Years of Education: 12   Occupational History  . retired    Social History Main Topics  . Smoking status: Current Every Day Smoker -- 0.3 packs/day  . Smokeless tobacco: Not on file  . Alcohol Use: No  . Drug Use: No  . Sexually Active: Not on file  Other Topics Concern  . Not on file   Social History Narrative  . No narrative on file    Review of Systems:  All systems reviewed.  They are negative to the above problem except as previously stated.  Vital Signs: BP 114/77  P 78  Wt 132 [place] is in NAD HEENT:  Normocephalic, atraumatic. EOMI, PERRLA.  Neck: JVP is normal.  No bruits.  Lungs: Rhonchi bilaterally  Some decreased air flow with wheez Heart: Regular rate and rhythm. Normal S1, S2. No S3.   No significant murmurs. PMI not displaced.  Abdomen:  Supple, nontender. Normal bowel sounds. No masses. No hepatomegaly.  Extremities:   Good distal pulses throughout. No lower extremity edema.  Musculoskeletal :moving all extremities.  Neuro:   alert and oriented x3.  CN II-XII grossly intact.  EKG  SR 78  RBBB.  ST depression inferior and  Lateral leads Assessment and Plan:  1.  COPD  Severe flare.  Appears to be recovering.  I have encouraged her to stop smoking.  2.  Edema.  Fluid status appears good.  3.  Hx VSD  S/p repair.  Patient with LV dysfunction  Now on ACE.  No b blcker with COPD.     4.  HL  WIll  check lipds today.

## 2012-02-16 NOTE — Patient Instructions (Addendum)
LABS TODAY:  CBC, BMET, DIGOXIN LEVEL, LIPID PANEL, BNP  Your physician wants you to follow-up in: 6 months with Dr. Tenny Craw. You will receive a reminder letter in the mail two months in advance. If you don't receive a letter, please call our office to schedule the follow-up appointment.

## 2012-02-17 LAB — DIGOXIN LEVEL: Digoxin Level: 0.6 ng/mL — ABNORMAL LOW (ref 0.8–2.0)

## 2012-02-19 NOTE — Telephone Encounter (Signed)
New problem:  Test results.  

## 2012-02-19 NOTE — Telephone Encounter (Signed)
lmtcb

## 2012-02-19 NOTE — Telephone Encounter (Signed)
Pt rtn your call

## 2012-03-03 ENCOUNTER — Telehealth: Payer: Self-pay | Admitting: Internal Medicine

## 2012-03-03 ENCOUNTER — Telehealth: Payer: Self-pay | Admitting: Cardiovascular Disease

## 2012-03-03 NOTE — Telephone Encounter (Signed)
Pt went to pick up lanoxin , copay too high, wants to get generic pls call [phone]

## 2012-03-03 NOTE — Telephone Encounter (Signed)
Pt went to pick up lanoxin , copay too high, wants to get generic pls call [phone]

## 2012-03-03 NOTE — Telephone Encounter (Signed)
Spoke to pt.  States she is now taking Digoxin instead of lanoxin due to copay increase.

## 2012-03-06 ENCOUNTER — Other Ambulatory Visit: Payer: Self-pay

## 2012-04-12 ENCOUNTER — Telehealth: Payer: Self-pay | Admitting: Internal Medicine

## 2012-04-12 NOTE — Telephone Encounter (Signed)
Patient Information:  Caller Name: [place]ita  Phone: [phone]  Patient: Kimberly [name]  Gender: Female  DOB: October 11, 1945  Age: 67 Years  PCP: Oliver Barre (Adults only)  Office Follow Up:  Does the office need to follow up with this patient?: Yes  Instructions For The Office: Please follow up with patient regarding refill of cough medicine given previously.  RN not able to identify on med list.   She has about 1 dose left.  [phone]  RN Note:  Patient calls with symptoms of an uncomplicated cold.  Nasal drainage is clear.  Cough is productive with clear sputum.  Afebrile, eating/drinking normal for patient.  Denies emergent symptoms.  She is feeling pretty well she just doesn't want to get worse. Requested and antibiotic with RN explaining that she would need to be seen. Is almost out of cough medicine and was hoping provider with call in a small amount to help her get in this bout of cold.  [phone]  Symptoms  Reason For Call & Symptoms: cold  Reviewed Health History In EMR: Yes  Reviewed Medications In EMR: Yes  Reviewed Allergies In EMR: Yes  Reviewed Surgeries / Procedures: Yes  Date of Onset of Symptoms: 04/05/2012  Treatments Tried: Tried just cough medicine  Treatments Tried Worked: Yes  Guideline(s) Used:  Colds  Disposition Per Guideline:   Home Care  Reason For Disposition Reached:   Colds with no complications  Advice Given:  For a Runny Nose With Profuse Discharge:   Nasal mucus and discharge helps to wash viruses and bacteria out of the nose and sinuses.  For a Runny Nose With Profuse Discharge:   Nasal mucus and discharge helps to wash viruses and bacteria out of the nose and sinuses.  For a Stuffy Nose - Use Nasal Washes:  Introduction: Saline (salt water) nasal irrigation (nasal wash) is an effective and simple home remedy for treating stuffy nose and sinus congestion. The nose can be  irrigated by pouring, spraying, or squirting salt water into the nose and then letting it run back out.  Treatment for Associated Symptoms of Colds:  For muscle aches, headaches, or moderate fever (more than 101 F or 38.9 C): Take acetaminophen every 4 hours.  Sore throat: Try throat lozenges, hard candy, or warm chicken broth.  Hydrate: Drink adequate liquids.  Humidifier:  If the air in your home is dry, use a cool-mist humidifier  Expected Course:   Fever may last 2-3 days  Nasal discharge 7-14 days  Cough up to 2-3 weeks.  Call Back If:  Difficulty breathing occurs  Fever lasts more than 3 days  Nasal discharge lasts more than 10 days  Cough lasts more than 3 weeks  You become worse  Patient Will Follow Care Advice:  YES

## 2012-04-13 NOTE — Telephone Encounter (Signed)
Patient informed.  She stated the last tussin DM was filled by Dr. Juanetta Gosling in [place]

## 2012-04-13 NOTE — Telephone Encounter (Signed)
Ok for pt to ask for refill per Dr Juanetta Gosling

## 2012-04-13 NOTE — Telephone Encounter (Signed)
Pt last seen jan 2014  Last tussin DM rx was 2012  Please make OV with me or regina

## 2012-04-13 NOTE — Telephone Encounter (Signed)
Patient informed of MD instructions.  The patient stated she had enough medication for tonight.  If no better tomorrow will call here to schedule with Dr. Jonny Ruiz

## 2012-05-10 ENCOUNTER — Encounter: Payer: Self-pay | Admitting: *Deleted

## 2012-05-18 ENCOUNTER — Other Ambulatory Visit: Payer: Self-pay | Admitting: Internal Medicine

## 2012-08-25 ENCOUNTER — Other Ambulatory Visit: Payer: Self-pay

## 2012-09-13 ENCOUNTER — Encounter: Payer: Self-pay | Admitting: Internal Medicine

## 2012-09-13 ENCOUNTER — Ambulatory Visit (INDEPENDENT_AMBULATORY_CARE_PROVIDER_SITE_OTHER): Payer: Medicare Other | Admitting: Internal Medicine

## 2012-09-13 ENCOUNTER — Ambulatory Visit (INDEPENDENT_AMBULATORY_CARE_PROVIDER_SITE_OTHER)
Admission: RE | Admit: 2012-09-13 | Discharge: 2012-09-13 | Disposition: A | Discharge status: Home / Self Care | Payer: Medicare Other | Source: Ambulatory Visit | Attending: Internal Medicine | Admitting: Internal Medicine

## 2012-09-13 VITALS — BP 110/70 | HR 69 | Temp 99.0°F | Ht 61.0 in | Wt 133.5 lb

## 2012-09-13 DIAGNOSIS — I428 Other cardiomyopathies: Secondary | ICD-10-CM

## 2012-09-13 DIAGNOSIS — F411 Generalized anxiety disorder: Secondary | ICD-10-CM

## 2012-09-13 DIAGNOSIS — J441 Chronic obstructive pulmonary disease with (acute) exacerbation: Secondary | ICD-10-CM

## 2012-09-13 DIAGNOSIS — F419 Anxiety disorder, unspecified: Secondary | ICD-10-CM

## 2012-09-13 DIAGNOSIS — J209 Acute bronchitis, unspecified: Secondary | ICD-10-CM

## 2012-09-13 DIAGNOSIS — I10 Essential (primary) hypertension: Secondary | ICD-10-CM

## 2012-09-13 DIAGNOSIS — I429 Cardiomyopathy, unspecified: Secondary | ICD-10-CM

## 2012-09-13 DIAGNOSIS — J449 Chronic obstructive pulmonary disease, unspecified: Secondary | ICD-10-CM | POA: Insufficient documentation

## 2012-09-13 MED ORDER — METHYLPREDNISOLONE ACETATE 80 MG/ML IJ SUSP
80.0000 mg | Freq: Once | INTRAMUSCULAR | Status: AC
  Administered 2012-09-13: 80 mg via INTRAMUSCULAR

## 2012-09-13 MED ORDER — HYDROCOD POLST-CHLORPHEN POLST 10-8 MG/5ML PO LQCR: 5.0000 mL | Freq: Two times a day (BID) | ORAL | Status: DC | PRN

## 2012-09-13 MED ORDER — PREDNISONE 10 MG PO TABS: ORAL_TABLET | ORAL | Status: DC

## 2012-09-13 MED ORDER — ALPRAZOLAM 0.25 MG PO TABS: ORAL_TABLET | ORAL | Status: DC

## 2012-09-13 MED ORDER — LEVOFLOXACIN 250 MG PO TABS: 250.0000 mg | ORAL_TABLET | Freq: Every day | ORAL | Status: DC

## 2012-09-13 NOTE — Assessment & Plan Note (Signed)
No volume overload by exam this time,  to f/u any worsening symptoms or concerns, for cxr as above

## 2012-09-13 NOTE — Assessment & Plan Note (Signed)
Mild to mod, for antibx course,  to f/u any worsening symptoms or concerns 

## 2012-09-13 NOTE — Assessment & Plan Note (Signed)
Mild to mod, for depomedrol IM and predpack,  to f/u any worsening symptoms or concerns, for cough med

## 2012-09-13 NOTE — Assessment & Plan Note (Signed)
With mult stressors at home including a son, cont meds, no SI or HI, declines counseling,  to f/u any worsening symptoms or concerns

## 2012-09-13 NOTE — Assessment & Plan Note (Signed)
stable overall by history and exam, recent data reviewed with pt, and pt to continue medical treatment as before,  to f/u any worsening symptoms or concerns BP Readings from Last 3 Encounters:  09/13/12 110/70  02/16/12 115/77  02/11/12 142/78

## 2012-09-13 NOTE — Progress Notes (Signed)
Subjective:    Patient ID: Kimberly [name], female    DOB: [birth year], 67 y.o.   MRN: [id number]  HPI Here with acute onset mild to mod 2-3 days ST, HA, general weakness and malaise, with prod cough greenish sputum, but Pt denies chest pain, increased sob or doe, wheezing, orthopnea, PND, increased LE swelling, palpitations, dizziness or syncope. Tried to get here faster than before when she had signficant volume overload as well. Denies worsening depressive symptoms, suicidal ideation, or panic; has ongoing anxiety, not increased recently, needs xanax refill Past Medical History  Diagnosis Date  . VSD (ventricular septal defect)   . PVOD (pulmonary veno-occlusive disease)   . COPD (chronic obstructive pulmonary disease)   . Dyslipidemia   . Anxiety   . GERD (gastroesophageal reflux disease)   . Pericarditis   . Thoracic outlet syndrome   . HTN (hypertension) 02/22/2011  . Asthma   . Blood in stool   . Ulcer   . Migraines   . Migraine 02/26/2011   Past Surgical History  Procedure Laterality Date  . Vsd repair    . Tubal ligation    . Cholecystectomy    . Cesarean section      reports that she has been smoking.  She does not have any smokeless tobacco history on file. She reports that she does not drink alcohol or use illicit drugs. family history includes Alcohol abuse in her other; Arthritis in her other and other; Cancer in her other; Hypertension in her other and another family member; Stroke in her other and another family member; Throat cancer in an other family member. Allergies  Allergen Reactions  . Codeine    Current Outpatient Prescriptions on File Prior to Visit  Medication Sig Dispense Refill  . albuterol (VENTOLIN HFA) 108 (90 BASE) MCG/ACT inhaler Inhale 2 puffs into the lungs every 6 (six) hours as needed.        Marland Kitchen aspirin 81 MG tablet Take 81 mg by mouth daily.        . digoxin (LANOXIN) 0.125 MG tablet Take 1 tablet (0.125 mg total) by mouth daily.  90 tablet  3  .  furosemide (LASIX) 20 MG tablet Take 1 tablet (20 mg total) by mouth as needed (For swelling).  90 tablet  3  . lisinopril (PRINIVIL,ZESTRIL) 5 MG tablet Take 1 tablet (5 mg total) by mouth daily.  90 tablet  3  . simvastatin (ZOCOR) 40 MG tablet TAKE ONE (1) TABLET EACH DAY  30 tablet  6   No current facility-administered medications on file prior to visit.   Review of Systems  Constitutional: Negative for unexpected weight change, or unusual diaphoresis  HENT: Negative for tinnitus.   Eyes: Negative for photophobia and visual disturbance.  Respiratory: Negative for choking and stridor.   Gastrointestinal: Negative for vomiting and blood in stool.  Genitourinary: Negative for hematuria and decreased urine volume.  Musculoskeletal: Negative for acute joint swelling Skin: Negative for color change and wound.  Neurological: Negative for tremors and numbness other than noted  Psychiatric/Behavioral: Negative for decreased concentration or  hyperactivity.       Objective:   Physical Exam BP 110/70  Pulse 69  Temp(Src) 99 F (37.2 C) (Oral)  Ht 5\' 1"  (1.549 m)  Wt 133 lb 8 oz (60.555 kg)  BMI 25.24 kg/m2  SpO2 93% VS noted, mild ill Constitutional: Pt appears well-developed and well-nourished.  HENT: Head: NCAT.  Right Ear: External ear normal.  Left Ear: External  ear normal.  Eyes: Conjunctivae and EOM are normal. Pupils are equal, round, and reactive to light.  Bilat tm's with mild erythema.  Max sinus areas non tender.  Pharynx with mild erythema, no exudate Neck: Normal range of motion. Neck supple.  Cardiovascular: Normal rate and regular rhythm.   Pulmonary/Chest: Effort normal and breath sounds decreased with bilat rare wheezes, no rales Neurological: Pt is alert. Not confused  Skin: Skin is warm. No erythema. No LE edema Psychiatric: Pt behavior is normal. Thought content normal.     Assessment & Plan:

## 2012-09-13 NOTE — Patient Instructions (Addendum)
You had the steroid shot today Please take all new medication as prescribed - th antibiotic, cough medicine, prednisone Please continue all other medications as before, and refills have been done if requested, including the xanax  Please go to the XRAY Department in the Basement (go straight as you get off the elevator) for the x-ray testing  You will be contacted by phone if any changes need to be made immediately.  Otherwise, you will receive a letter about your results with an explanation, but please check with MyChart first.  Please remember to sign up for My Chart if you have not done so, as this will be important to you in the future with finding out test results, communicating by private email, and scheduling acute appointments online when needed.  Please return in 3 months, or sooner if needed

## 2012-09-17 ENCOUNTER — Telehealth: Payer: Self-pay | Admitting: Internal Medicine

## 2012-09-17 ENCOUNTER — Encounter: Payer: Self-pay | Admitting: Internal Medicine

## 2012-09-17 ENCOUNTER — Ambulatory Visit (INDEPENDENT_AMBULATORY_CARE_PROVIDER_SITE_OTHER): Payer: Medicare Other | Admitting: Internal Medicine

## 2012-09-17 VITALS — BP 122/72 | HR 85 | Temp 97.7°F | Ht 61.0 in | Wt 136.2 lb

## 2012-09-17 DIAGNOSIS — J209 Acute bronchitis, unspecified: Secondary | ICD-10-CM

## 2012-09-17 DIAGNOSIS — I10 Essential (primary) hypertension: Secondary | ICD-10-CM

## 2012-09-17 DIAGNOSIS — J441 Chronic obstructive pulmonary disease with (acute) exacerbation: Secondary | ICD-10-CM

## 2012-09-17 DIAGNOSIS — E8779 Other fluid overload: Secondary | ICD-10-CM

## 2012-09-17 DIAGNOSIS — E8770 Fluid overload, unspecified: Secondary | ICD-10-CM

## 2012-09-17 MED ORDER — PREDNISONE 10 MG PO TABS: ORAL_TABLET | ORAL | Status: DC

## 2012-09-17 MED ORDER — METHYLPREDNISOLONE ACETATE 80 MG/ML IJ SUSP
120.0000 mg | Freq: Once | INTRAMUSCULAR | Status: AC
  Administered 2012-09-17: 120 mg via INTRAMUSCULAR

## 2012-09-17 MED ORDER — FLUTICASONE-SALMETEROL 250-50 MCG/DOSE IN AEPB: 1.0000 | INHALATION_SPRAY | Freq: Two times a day (BID) | RESPIRATORY_TRACT | Status: DC

## 2012-09-17 NOTE — Assessment & Plan Note (Signed)
Mild worse overall, not responding well to usual tx, today for depomedrol IM 120mg , higher predpack course, and adviar 500 bid for 2 wks, then 250 bid for 2 wks (gave as samples), consider long term advair as well

## 2012-09-17 NOTE — Assessment & Plan Note (Signed)
None again today by exam and as per cxr last visit, Please continue all other medications as before

## 2012-09-17 NOTE — Telephone Encounter (Signed)
Patient Information:  Caller Name: Maryruth  Phone: [phone]  Patient: Kimberly [name], Kimberly [name]  Gender: Female  DOB: Aug 04, 1945  Age: 67 Years  PCP: Oliver Barre (Adults only)  Office Follow Up:  Does the office need to follow up with this patient?: No  Instructions For The Office: N/A  RN Note:  Audible wheezing.  Used Albuterol more often then every 4 hours yestereday; says it is ordered for use every 6 hours. Moderate asthma attack with shortness of breath during coughing spasms, wheezing and restricted inspiration. No peak flow meter. Not smoking currently due to symptoms.   Symptoms  Reason For Call & Symptoms: No improvement to bronchitis since office visit 09/13/12.  Reported severe coughing spells causing significant shortness of breath.  Started on Prednisone, Levaquin, Tussinex and Albuterol.  Asking if O2 is recommended.  Reviewed Health History In EMR: Yes  Reviewed Medications In EMR: Yes  Reviewed Allergies In EMR: Yes  Reviewed Surgeries / Procedures: Yes  Date of Onset of Symptoms: 09/09/2012  Treatments Tried: Levaquin, Prednisone, Tussinex, Albuterol  Treatments Tried Worked: No  Guideline(s) Used:  Cough  Asthma Attack  Disposition Per Guideline:   See Today in Office  Reason For Disposition Reached:   Asthma medicine (nebulizer or inhaler) is needed more frequently than every 4 hours to keep you comfortable  Advice Given:  Quick-Relief Asthma Medicine:   Start your quick-relief medicine (e.g., albuterol, salbutamol) at the first sign of any coughing or shortness of breath (don't wait for wheezing). Use your inhaler (2 puffs each time) or nebulizer every 4 hours. Continue the quick-relief medicine until you have not wheezed or coughed for 48 hours.  The best "cough medicine" for an adult with asthma is always the asthma medicine (Note: Don't use cough suppressants, but cough drops may help a tickly cough).  Drinking Liquids:  Try to drink normal amount of liquids  (e.g., water). Being adequately hydrated makes it easier to cough up the sticky lung mucus.  Drinking Liquids:  Try to drink normal amount of liquids (e.g., water). Being adequately hydrated makes it easier to cough up the sticky lung mucus.  Humidifier:   If the air is dry, use a cool mist humidifier to prevent drying of the upper airway.  Avoid Triggers:  Avoid known triggers of asthma attacks (e.g., tobacco smoke, cats, other pets, feather pillows, exercise).  Expected Course:  If treatment is started early, most asthma attacks are quickly brought under control. All wheezing should be gone by 5 days.  Call Back If:  Inhaled asthma medicine (nebulizer or inhaler) is needed more often than every 4 hours  Wheezing has not completely cleared after 5 days  You become worse.  Patient Will Follow Care Advice:  YES  Appointment Scheduled:  09/17/2012 16:00:00 Appointment Scheduled Provider:  Oliver Barre (Adults only)

## 2012-09-17 NOTE — Progress Notes (Signed)
Subjective:    Patient ID: Kimberly [name], female    DOB: May 06, 1945, 67 y.o.   MRN: [id number]  HPI Here to f/u, unfortuanately some improved but briefly as wheezing and tightness has persisted,  Though fever, colored prod cough have improved.  Pt denies chest pain, orthopnea, PND, increased LE swelling, palpitations, dizziness or syncope .  Pt denies new neurological symptoms such as new headache, or facial or extremity weakness or numbness   Pt denies polydipsia, polyuria. Overall good compliance with treatment, and good medicine tolerability. Past Medical History  Diagnosis Date  . VSD (ventricular septal defect)   . PVOD (pulmonary veno-occlusive disease)   . COPD (chronic obstructive pulmonary disease)   . Dyslipidemia   . Anxiety   . GERD (gastroesophageal reflux disease)   . Pericarditis   . Thoracic outlet syndrome   . HTN (hypertension) 02/22/2011  . Asthma   . Blood in stool   . Ulcer   . Migraines   . Migraine 02/26/2011   Past Surgical History  Procedure Laterality Date  . Vsd repair    . Tubal ligation    . Cholecystectomy    . Cesarean section      reports that she has been smoking.  She does not have any smokeless tobacco history on file. She reports that she does not drink alcohol or use illicit drugs. family history includes Alcohol abuse in her other; Arthritis in her other and other; Cancer in her other; Hypertension in her other and another family member; Stroke in her other and another family member; Throat cancer in an other family member. Allergies  Allergen Reactions  . Codeine    Current Outpatient Prescriptions on File Prior to Visit  Medication Sig Dispense Refill  . albuterol (VENTOLIN HFA) 108 (90 BASE) MCG/ACT inhaler Inhale 2 puffs into the lungs every 6 (six) hours as needed.        . ALPRAZolam (XANAX) 0.25 MG tablet TAKE 1/2 TO 1 TABLET BY MOUTH TWICE     DAILY AS NEEDED FOR STRESS  60 tablet  3  . aspirin 81 MG tablet Take 81 mg by mouth daily.         . chlorpheniramine-HYDROcodone (TUSSIONEX PENNKINETIC ER) 10-8 MG/5ML LQCR Take 5 mLs by mouth every 12 (twelve) hours as needed.  140 mL  1  . digoxin (LANOXIN) 0.125 MG tablet Take 1 tablet (0.125 mg total) by mouth daily.  90 tablet  3  . furosemide (LASIX) 20 MG tablet Take 1 tablet (20 mg total) by mouth as needed (For swelling).  90 tablet  3  . levofloxacin (LEVAQUIN) 250 MG tablet Take 1 tablet (250 mg total) by mouth daily.  10 tablet  0  . lisinopril (PRINIVIL,ZESTRIL) 5 MG tablet Take 1 tablet (5 mg total) by mouth daily.  90 tablet  3  . simvastatin (ZOCOR) 40 MG tablet TAKE ONE (1) TABLET EACH DAY  30 tablet  6   No current facility-administered medications on file prior to visit.   Review of Systems  Constitutional: Negative for unexpected weight change, or unusual diaphoresis  HENT: Negative for tinnitus.   Eyes: Negative for photophobia and visual disturbance.  Respiratory: Negative for choking and stridor.   Gastrointestinal: Negative for vomiting and blood in stool.  Genitourinary: Negative for hematuria and decreased urine volume.  Musculoskeletal: Negative for acute joint swelling Skin: Negative for color change and wound.  Neurological: Negative for tremors and numbness other than noted  Psychiatric/Behavioral: Negative for  decreased concentration or  hyperactivity.       Objective:   Physical Exam BP 122/72  Pulse 85  Temp(Src) 97.7 F (36.5 C) (Oral)  Ht 5\' 1"  (1.549 m)  Wt 136 lb 4 oz (61.803 kg)  BMI 25.76 kg/m2  SpO2 96% VS noted, mod ill Constitutional: Pt appears well-developed and well-nourished.  HENT: Head: NCAT.  Right Ear: External ear normal.  Left Ear: External ear normal.  Bilat tm's with minor erythema.  Max sinus areas non tender.  Pharynx with mild erythema, no exudate Eyes: Conjunctivae and EOM are normal. Pupils are equal, round, and reactive to light.  Neck: Normal range of motion. Neck supple.  Cardiovascular: Normal rate and  regular rhythm.   Pulmonary/Chest: Effort normal and breath sounds decreased with marked wheeze /rhonchi bilat, freq cough.  Neurological: Pt is alert. Not confused  Skin: Skin is warm. No erythema.  Psychiatric: Pt behavior is normal. Thought content normal.     Assessment & Plan:

## 2012-09-17 NOTE — Patient Instructions (Signed)
You had the steroid shot today (the higher strength today) Please take all new medication as prescribed - the prednisone higher dosing Please also use the advair 500 at 1 puff twice per day for x 2 wks, then the advair 250 at 1 puff twice per day for 2 wks Please continue all other medications as before, including the antibiotic, cough med, and proair inhaler if needed  Please remember to sign up for My Chart if you have not done so, as this will be important to you in the future with finding out test results, communicating by private email, and scheduling acute appointments online when needed.

## 2012-09-18 NOTE — Assessment & Plan Note (Signed)
stable overall by history and exam, recent data reviewed with pt, and pt to continue medical treatment as before,  to f/u any worsening symptoms or concerns BP Readings from Last 3 Encounters:  09/17/12 122/72  09/13/12 110/70  02/16/12 115/77

## 2012-09-18 NOTE — Assessment & Plan Note (Signed)
Improved, to cont antibx as before

## 2012-09-24 ENCOUNTER — Telehealth: Payer: Self-pay | Admitting: *Deleted

## 2012-09-24 MED ORDER — AZITHROMYCIN 250 MG PO TABS: ORAL_TABLET | ORAL | Status: DC

## 2012-09-24 NOTE — Telephone Encounter (Signed)
Pt called states she is still coughing up and blowing out green mucus after 10 day regiment of Levaquin.  Pt doe snot was not want to come back in but is requesting something for the mucus.  Please advise

## 2012-09-24 NOTE — Telephone Encounter (Signed)
Patient informed. 

## 2012-09-24 NOTE — Telephone Encounter (Signed)
Ok for zpack  Also please try OTC mucinex bid, to help get the mucous thinned out and moving

## 2012-09-24 NOTE — Telephone Encounter (Signed)
Spoke with pt advised of MDs order 

## 2012-10-13 ENCOUNTER — Encounter: Payer: Self-pay | Admitting: Internal Medicine

## 2012-10-13 MED ORDER — HYDROCOD POLST-CHLORPHEN POLST 10-8 MG/5ML PO LQCR: 5.0000 mL | Freq: Every evening | ORAL | Status: DC | PRN

## 2012-10-13 NOTE — Telephone Encounter (Signed)
Done hardcopy to robin  

## 2012-11-18 ENCOUNTER — Ambulatory Visit: Payer: Medicare Other | Admitting: Internal Medicine

## 2012-11-23 ENCOUNTER — Ambulatory Visit (INDEPENDENT_AMBULATORY_CARE_PROVIDER_SITE_OTHER): Payer: Medicare Other | Admitting: Internal Medicine

## 2012-11-23 ENCOUNTER — Encounter: Payer: Self-pay | Admitting: Internal Medicine

## 2012-11-23 VITALS — BP 108/72 | HR 80 | Temp 98.0°F | Ht 61.0 in | Wt 135.4 lb

## 2012-11-23 DIAGNOSIS — J441 Chronic obstructive pulmonary disease with (acute) exacerbation: Secondary | ICD-10-CM

## 2012-11-23 DIAGNOSIS — E8770 Fluid overload, unspecified: Secondary | ICD-10-CM

## 2012-11-23 DIAGNOSIS — Z23 Encounter for immunization: Secondary | ICD-10-CM

## 2012-11-23 DIAGNOSIS — J019 Acute sinusitis, unspecified: Secondary | ICD-10-CM

## 2012-11-23 DIAGNOSIS — E8779 Other fluid overload: Secondary | ICD-10-CM

## 2012-11-23 DIAGNOSIS — I10 Essential (primary) hypertension: Secondary | ICD-10-CM

## 2012-11-23 MED ORDER — METHYLPREDNISOLONE ACETATE 80 MG/ML IJ SUSP
80.0000 mg | Freq: Once | INTRAMUSCULAR | Status: AC
  Administered 2012-11-23: 80 mg via INTRAMUSCULAR

## 2012-11-23 MED ORDER — FLUTICASONE-SALMETEROL 250-50 MCG/DOSE IN AEPB: 1.0000 | INHALATION_SPRAY | Freq: Two times a day (BID) | RESPIRATORY_TRACT | Status: DC

## 2012-11-23 MED ORDER — PREDNISONE 10 MG PO TABS: ORAL_TABLET | ORAL | Status: DC

## 2012-11-23 MED ORDER — CEFUROXIME AXETIL 250 MG PO TABS: 250.0000 mg | ORAL_TABLET | Freq: Two times a day (BID) | ORAL | Status: DC

## 2012-11-23 MED ORDER — HYDROCOD POLST-CHLORPHEN POLST 10-8 MG/5ML PO LQCR: 5.0000 mL | Freq: Every evening | ORAL | Status: DC | PRN

## 2012-11-23 NOTE — Assessment & Plan Note (Signed)
None by exam today, decliens cxr, cont same meds

## 2012-11-23 NOTE — Assessment & Plan Note (Signed)
stable overall by history and exam, recent data reviewed with pt, and pt to continue medical treatment as before,  to f/u any worsening symptoms or concerns BP Readings from Last 3 Encounters:  11/23/12 108/72  09/17/12 122/72  09/13/12 110/70

## 2012-11-23 NOTE — Assessment & Plan Note (Signed)
Mild to mod, for antibx course,  to f/u any worsening symptoms or concerns 

## 2012-11-23 NOTE — Assessment & Plan Note (Signed)
Declines cxr today, for cough med, gave sample adviar 500 for bid use x 2 wks, then return to 250 bid, Mild to mod, for depomedrol IM, low dose predpack asd,  to f/u any worsening symptoms or concerns

## 2012-11-23 NOTE — Patient Instructions (Addendum)
Please remember to followup with your mammogram You had the new Prevnar pneumonia shot today Please continue your efforts at being more active, low cholesterol diet, and weight control. You are otherwise up to date with prevention measures today. Please quit smoking You had the steroid shot today You are given the advair 500 sample inhaler to use twice per day for 2 wks  Please take all new medication as prescribed  - the antibiotic, cough medicine, prednisone Please continue all other medications as before, and refills have been done if requested including the advair 250 twice per day to use after the advair 500 sample runs out  Please return in 6 months, or sooner if needed  Please keep your appointments with your specialists as you have planned - cardiology later this week

## 2012-11-23 NOTE — Addendum Note (Signed)
Addended by: Scharlene Gloss B on: 11/23/2012 11:39 AM   Modules accepted: Orders

## 2012-11-23 NOTE — Progress Notes (Signed)
Pre-visit discussion using our clinic review tool. No additional management support is needed unless otherwise documented below in the visit note.  

## 2012-11-23 NOTE — Progress Notes (Signed)
Subjective:    Patient ID: Kimberly [name], female    DOB: 07/06/1945, 67 y.o.   MRN: [id number]  HPI  Here with acute onset mild to mod 2-3 days ST, HA, general weakness and malaise, with prod cough greenish sputum, but Pt denies chest pain, increased sob or doe, wheezing, orthopnea, PND, increased LE swelling, palpitations, dizziness or syncope, except for onset mild wheezing x 1 days, trying to get tx before gets worse.  Out of advair 250, doesn't always take twice per day.  Still smoking, due for prevnar.  Has card appt later this wk. Pt denies chest pain, increased sob or doe, wheezing, orthopnea, PND, increased LE swelling, palpitations, dizziness or syncope. Aug 25 cxr  - NAD Past Medical History  Diagnosis Date  . VSD (ventricular septal defect)   . PVOD (pulmonary veno-occlusive disease)   . COPD (chronic obstructive pulmonary disease)   . Dyslipidemia   . Anxiety   . GERD (gastroesophageal reflux disease)   . Pericarditis   . Thoracic outlet syndrome   . HTN (hypertension) 02/22/2011  . Asthma   . Blood in stool   . Ulcer   . Migraines   . Migraine 02/26/2011   Past Surgical History  Procedure Laterality Date  . Vsd repair    . Tubal ligation    . Cholecystectomy    . Cesarean section      reports that she has been smoking.  She does not have any smokeless tobacco history on file. She reports that she does not drink alcohol or use illicit drugs. family history includes Alcohol abuse in her other; Arthritis in her other and other; Cancer in her other; Hypertension in her other and another family member; Stroke in her other and another family member; Throat cancer in an other family member. Allergies  Allergen Reactions  . Codeine    Current Outpatient Prescriptions on File Prior to Visit  Medication Sig Dispense Refill  . albuterol (VENTOLIN HFA) 108 (90 BASE) MCG/ACT inhaler Inhale 2 puffs into the lungs every 6 (six) hours as needed.        . ALPRAZolam (XANAX) 0.25 MG  tablet TAKE 1/2 TO 1 TABLET BY MOUTH TWICE     DAILY AS NEEDED FOR STRESS  60 tablet  3  . aspirin 81 MG tablet Take 81 mg by mouth daily.        . chlorpheniramine-HYDROcodone (TUSSIONEX PENNKINETIC ER) 10-8 MG/5ML LQCR Take 5 mLs by mouth at bedtime as needed.  140 mL  1  . digoxin (LANOXIN) 0.125 MG tablet Take 1 tablet (0.125 mg total) by mouth daily.  90 tablet  3  . Fluticasone-Salmeterol (ADVAIR DISKUS) 250-50 MCG/DOSE AEPB Inhale 1 puff into the lungs 2 (two) times daily.  1 each  00  . furosemide (LASIX) 20 MG tablet Take 1 tablet (20 mg total) by mouth as needed (For swelling).  90 tablet  3  . lisinopril (PRINIVIL,ZESTRIL) 5 MG tablet Take 1 tablet (5 mg total) by mouth daily.  90 tablet  3  . simvastatin (ZOCOR) 40 MG tablet TAKE ONE (1) TABLET EACH DAY  30 tablet  6   No current facility-administered medications on file prior to visit.   Review of Systems  Constitutional: Negative for unexpected weight change, or unusual diaphoresis  HENT: Negative for tinnitus.   Eyes: Negative for photophobia and visual disturbance.  Respiratory: Negative for choking and stridor.   Gastrointestinal: Negative for vomiting and blood in stool.  Genitourinary: Negative  for hematuria and decreased urine volume.  Musculoskeletal: Negative for acute joint swelling Skin: Negative for color change and wound.  Neurological: Negative for tremors and numbness other than noted  Psychiatric/Behavioral: Negative for decreased concentration or  hyperactivity.    Plans to see Dr Grapey/urology for bladder symptoms, incontinence    Objective:   Physical Exam BP 108/72  Pulse 80  Temp(Src) 98 F (36.7 C) (Oral)  Ht 5\' 1"  (1.549 m)  Wt 135 lb 6 oz (61.406 kg)  BMI 25.59 kg/m2  SpO2 96% VS noted, mild ill Constitutional: Pt appears well-developed and well-nourished.  HENT: Head: NCAT.  Right Ear: External ear normal.  Left Ear: External ear normal.  Bilat tm's with mild erythema.  Max sinus areas  mild tender on right only,  Pharynx with mild erythema, no exudate Eyes: Conjunctivae and EOM are normal. Pupils are equal, round, and reactive to light.  Neck: Normal range of motion. Neck supple.  Cardiovascular: Normal rate and regular rhythm.   Pulmonary/Chest: Effort normal and breath sounds decreased as bases with wheeze on left, no rales.  Neurological: Pt is alert. Not confused  Skin: Skin is warm. No erythema.  Psychiatric: Pt behavior is normal. Thought content normal.  1+ nervous    Assessment & Plan:

## 2012-11-26 ENCOUNTER — Ambulatory Visit (INDEPENDENT_AMBULATORY_CARE_PROVIDER_SITE_OTHER): Payer: Medicare Other | Admitting: Internal Medicine

## 2012-11-26 VITALS — BP 138/84 | HR 56 | Ht 61.0 in | Wt 137.0 lb

## 2012-11-26 DIAGNOSIS — Q210 Ventricular septal defect: Secondary | ICD-10-CM

## 2012-11-26 DIAGNOSIS — J449 Chronic obstructive pulmonary disease, unspecified: Secondary | ICD-10-CM

## 2012-11-26 DIAGNOSIS — E785 Hyperlipidemia, unspecified: Secondary | ICD-10-CM

## 2012-11-26 NOTE — Progress Notes (Signed)
HPI Patient is a 67 year old with a history of congenital heart disease (s/p VSD repair), PVOD, dyslipidemia, COPD and tobacco use. \  Echo in  showqed LV and RV function were stable.  LVEF was 35 to 40% with akinessi of there anteroseptum  RV was normal. I saw her in Jan  At the time she was having COPD exacerbation.   Seen by Excell Seltzer earlier this week  Placed on Pred 10 and Ceftin  And Advai Down to 1ppd every 5 days    Allergies  Allergen Reactions  . Codeine      Current Outpatient Prescriptions  Medication Sig Dispense Refill  . albuterol (VENTOLIN HFA) 108 (90 BASE) MCG/ACT inhaler Inhale 2 puffs into the lungs every 6 (six) hours as needed.        . ALPRAZolam (XANAX) 0.25 MG tablet TAKE 1/2 TO 1 TABLET BY MOUTH TWICE     DAILY AS NEEDED FOR STRESS  60 tablet  3  . aspirin 81 MG tablet Take 81 mg by mouth daily.        . cefUROXime (CEFTIN) 250 MG tablet Take 1 tablet (250 mg total) by mouth 2 (two) times daily.  20 tablet  0  . chlorpheniramine-HYDROcodone (TUSSIONEX PENNKINETIC ER) 10-8 MG/5ML LQCR Take 5 mLs by mouth at bedtime as needed.  140 mL  0  . digoxin (LANOXIN) 0.125 MG tablet Take 1 tablet (0.125 mg total) by mouth daily.  90 tablet  3  . Fluticasone-Salmeterol (ADVAIR DISKUS) 250-50 MCG/DOSE AEPB Inhale 1 puff into the lungs 2 (two) times daily.  1 each  11  . furosemide (LASIX) 20 MG tablet Take 1 tablet (20 mg total) by mouth as needed (For swelling).  90 tablet  3  . lisinopril (PRINIVIL,ZESTRIL) 5 MG tablet Take 1 tablet (5 mg total) by mouth daily.  90 tablet  3  . predniSONE (DELTASONE) 10 MG tablet 2 tabs by mouth per day for 5 days  10 tablet  0  . simvastatin (ZOCOR) 40 MG tablet TAKE ONE (1) TABLET EACH DAY  30 tablet  6   No current facility-administered medications for this visit.    Past Medical History  Diagnosis Date  . VSD (ventricular septal defect)   . PVOD (pulmonary veno-occlusive disease)   . COPD (chronic obstructive pulmonary disease)    . Dyslipidemia   . Anxiety   . GERD (gastroesophageal reflux disease)   . Pericarditis   . Thoracic outlet syndrome   . HTN (hypertension) 02/22/2011  . Asthma   . Blood in stool   . Ulcer   . Migraines   . Migraine 02/26/2011    Past Surgical History  Procedure Laterality Date  . Vsd repair    . Tubal ligation    . Cholecystectomy    . Cesarean section      Family History  Problem Relation Age of Onset  . Throat cancer    . Hypertension    . Stroke    . Alcohol abuse Other   . Arthritis Other   . Cancer Other     lung cancer  . Hypertension Other   . Arthritis Other   . Stroke Other     History   Social History  . Marital Status: Married    Spouse Name: N/A    Number of Children: N/A  . Years of Education: 12   Occupational History  . retired    Social History Main Topics  . Smoking  status: Current Every Day Smoker -- 0.30 packs/day  . Smokeless tobacco: Not on file  . Alcohol Use: No  . Drug Use: No  . Sexual Activity: Not on file   Other Topics Concern  . Not on file   Social History Narrative  . No narrative on file    Review of Systems:  All systems reviewed.  They are negative to the above problem except as previously stated.  Vital Signs: BP 114/77  P 78  Wt 132 [place] is in NAD HEENT:  Normocephalic, atraumatic. EOMI, PERRLA.  Neck: JVP is normal.  No bruits.  Lungs: Decreased air flow (moderate) Heart: Regular rate and rhythm. Normal S1, S2. No S3.  III/VI systolic murmur LUSB PMI not displaced.  Abdomen:  Supple, nontender. Normal bowel sounds. No masses. No hepatomegaly.  Extremities:   Good distal pulses throughout. No lower extremity edema.  Musculoskeletal :moving all extremities.  Neuro:   alert and oriented x3.  CN II-XII grossly intact.  EKG  SR 78  RBBB.  ST depression inferior and  Lateral leads Assessment and Plan:  1.  COPD  Breathing is better than in January.   2.  Edema.  Fluid status appears good.  3.  Hx VSD  S/p  repair.  Will review echo from last year  4.  HL

## 2012-11-26 NOTE — Patient Instructions (Signed)
Your physician wants you to follow-up in: 04/2013 You will receive a reminder letter in the mail two months in advance. If you don't receive a letter, please call our office to schedule the follow-up appointment.

## 2012-12-12 ENCOUNTER — Encounter: Payer: Self-pay | Admitting: Internal Medicine

## 2012-12-14 MED ORDER — CYCLOBENZAPRINE HCL 10 MG PO TABS: 10.0000 mg | ORAL_TABLET | Freq: Every evening | ORAL | Status: DC | PRN

## 2013-01-18 ENCOUNTER — Telehealth: Payer: Self-pay | Admitting: Internal Medicine

## 2013-01-18 DIAGNOSIS — T148XXA Other injury of unspecified body region, initial encounter: Secondary | ICD-10-CM

## 2013-01-18 NOTE — Telephone Encounter (Signed)
New message     Pt states that she bruises easy.  She is on 81mg  aspirin and bp medicine and heart medicine.  Bruises started about 1 month ago.  Could it be the aspirin?

## 2013-01-18 NOTE — Telephone Encounter (Signed)
Spoke with patient who c/o increased bruising and is wondering if the ASA is causing.  Patient states that the bruising occurs with minor incident but that each bruise is the result of some type of injury (although minor, such as hitting the washing machine or the dog putting her paw on her).  I advised patient that the ASA can cause increased bruising and also advised that skin gets thinner with increased age.  Patient would like to stop ASA and/or have CBC rechecked.  I advised patient that Dr. Tenny Craw is out of the office this week and that I would not recommend stopping ASA without consulting her.  I advised patient that she could call her PCP to order CBC; patient states she will wait for Dr. Tenny Craw to return next week.  I advised patient that I will send message to Dr. Tenny Craw for advice and that we will call her back next week.  I advised that if she becomes increasingly concerned in the meantime to contact her PCP.  Patient verbalized understanding and agreement with plan.

## 2013-01-20 HISTORY — PX: MYRINGOTOMY WITH TUBE PLACEMENT: SHX5663

## 2013-01-21 NOTE — Telephone Encounter (Signed)
Set patient up for CBC, PTT, INR

## 2013-01-23 ENCOUNTER — Encounter: Payer: Self-pay | Admitting: Internal Medicine

## 2013-01-24 NOTE — Telephone Encounter (Signed)
Spoke with pt, Aware of dr Harrington Challenger recommendation she would like to have the blood work done in the [facility]. She will call and make an appt. Orders placed.

## 2013-01-24 NOTE — Telephone Encounter (Signed)
Left message for pt to call.

## 2013-01-25 ENCOUNTER — Ambulatory Visit (INDEPENDENT_AMBULATORY_CARE_PROVIDER_SITE_OTHER): Payer: Medicare PPO | Admitting: *Deleted

## 2013-01-25 DIAGNOSIS — T148XXA Other injury of unspecified body region, initial encounter: Secondary | ICD-10-CM

## 2013-01-26 LAB — CBC WITH DIFFERENTIAL/PLATELET
BASOS: 1 %
Basophils Absolute: 0.1 10*3/uL (ref 0.0–0.2)
Eos: 1 %
Eosinophils Absolute: 0 10*3/uL (ref 0.0–0.4)
HCT: 42 % (ref 34.0–46.6)
HEMOGLOBIN: 14.6 g/dL (ref 11.1–15.9)
Immature Grans (Abs): 0 10*3/uL (ref 0.0–0.1)
Immature Granulocytes: 0 %
Lymphocytes Absolute: 1.7 10*3/uL (ref 0.7–3.1)
Lymphs: 24 %
MCH: 31.1 pg (ref 26.6–33.0)
MCHC: 34.8 g/dL (ref 31.5–35.7)
MCV: 89 fL (ref 79–97)
Monocytes Absolute: 0.5 10*3/uL (ref 0.1–0.9)
Monocytes: 8 %
NEUTROS ABS: 4.8 10*3/uL (ref 1.4–7.0)
Neutrophils Relative %: 66 %
RBC: 4.7 x10E6/uL (ref 3.77–5.28)
RDW: 13.7 % (ref 12.3–15.4)
WBC: 7.1 10*3/uL (ref 3.4–10.8)

## 2013-01-26 LAB — PROTIME-INR
INR: 1.1 (ref 0.8–1.2)
Prothrombin Time: 11.2 s (ref 9.1–12.0)

## 2013-02-02 ENCOUNTER — Telehealth: Payer: Self-pay | Admitting: Internal Medicine

## 2013-02-02 NOTE — Telephone Encounter (Signed)
New message  ° ° °Returning call back to nurse.  °

## 2013-02-02 NOTE — Telephone Encounter (Signed)
Spoke with pt, aware of lab results. 

## 2013-02-07 ENCOUNTER — Telehealth: Payer: Self-pay | Admitting: *Deleted

## 2013-02-07 NOTE — Telephone Encounter (Signed)
Continues to have head cold.  Received your email regarding mucinex.  Is requesting alternate recommendation.  Please advise.  Green productive cough & nasal congestion.    CB# [phone]

## 2013-02-07 NOTE — Telephone Encounter (Signed)
Ok to see nancy/NP

## 2013-02-08 NOTE — Telephone Encounter (Signed)
Notified pt with md response. Pt states she is not able to come to [place]. Will continue taking mucinex if not better by the end of the week will call to make appt...Johny Chess

## 2013-02-22 ENCOUNTER — Other Ambulatory Visit: Payer: Self-pay | Admitting: Internal Medicine

## 2013-03-01 ENCOUNTER — Other Ambulatory Visit: Payer: Self-pay | Admitting: Internal Medicine

## 2013-03-08 ENCOUNTER — Ambulatory Visit: Payer: Medicare PPO | Admitting: Internal Medicine

## 2013-03-10 ENCOUNTER — Other Ambulatory Visit: Payer: Self-pay | Admitting: Internal Medicine

## 2013-03-10 NOTE — Telephone Encounter (Signed)
Done hardcopy to robin  

## 2013-03-11 NOTE — Telephone Encounter (Signed)
Faxed hardcopy to [place].

## 2013-03-16 ENCOUNTER — Encounter: Payer: Self-pay | Admitting: Internal Medicine

## 2013-03-16 ENCOUNTER — Ambulatory Visit (INDEPENDENT_AMBULATORY_CARE_PROVIDER_SITE_OTHER): Payer: Medicare PPO | Admitting: Internal Medicine

## 2013-03-16 ENCOUNTER — Other Ambulatory Visit (INDEPENDENT_AMBULATORY_CARE_PROVIDER_SITE_OTHER): Payer: Commercial Managed Care - HMO

## 2013-03-16 VITALS — BP 120/82 | HR 86 | Temp 98.7°F | Ht 61.0 in | Wt 131.0 lb

## 2013-03-16 DIAGNOSIS — J4489 Other specified chronic obstructive pulmonary disease: Secondary | ICD-10-CM

## 2013-03-16 DIAGNOSIS — Z0000 Encounter for general adult medical examination without abnormal findings: Secondary | ICD-10-CM | POA: Diagnosis not present

## 2013-03-16 DIAGNOSIS — J449 Chronic obstructive pulmonary disease, unspecified: Secondary | ICD-10-CM

## 2013-03-16 DIAGNOSIS — J209 Acute bronchitis, unspecified: Secondary | ICD-10-CM

## 2013-03-16 LAB — HEPATIC FUNCTION PANEL
ALT: 15 U/L (ref 0–35)
AST: 18 U/L (ref 0–37)
Albumin: 4 g/dL (ref 3.5–5.2)
Alkaline Phosphatase: 82 U/L (ref 39–117)
BILIRUBIN TOTAL: 0.8 mg/dL (ref 0.3–1.2)
Bilirubin, Direct: 0.1 mg/dL (ref 0.0–0.3)
Total Protein: 7.4 g/dL (ref 6.0–8.3)

## 2013-03-16 LAB — BASIC METABOLIC PANEL
BUN: 10 mg/dL (ref 6–23)
CO2: 33 mEq/L — ABNORMAL HIGH (ref 19–32)
CREATININE: 0.8 mg/dL (ref 0.4–1.2)
Calcium: 9.3 mg/dL (ref 8.4–10.5)
Chloride: 102 mEq/L (ref 96–112)
GFR: 77.38 mL/min (ref >60.00)
Glucose, Bld: 81 mg/dL (ref 70–99)
Potassium: 3.9 mEq/L (ref 3.5–5.1)
Sodium: 139 mEq/L (ref 135–145)

## 2013-03-16 LAB — LDL CHOLESTEROL, DIRECT: LDL DIRECT: 112.6 mg/dL

## 2013-03-16 LAB — LIPID PANEL
CHOL/HDL RATIO: 3
CHOLESTEROL: 207 mg/dL — AB (ref 0–200)
HDL: 77.4 mg/dL (ref >39.00)
TRIGLYCERIDES: 71 mg/dL (ref 0.0–149.0)
VLDL: 14.2 mg/dL (ref 0.0–40.0)

## 2013-03-16 LAB — TSH: TSH: 1.28 u[IU]/mL (ref 0.35–5.50)

## 2013-03-16 MED ORDER — LEVOFLOXACIN 250 MG PO TABS: 250.0000 mg | ORAL_TABLET | Freq: Every day | ORAL | Status: DC

## 2013-03-16 MED ORDER — HYDROCOD POLST-CHLORPHEN POLST 10-8 MG/5ML PO LQCR: 5.0000 mL | Freq: Every evening | ORAL | Status: DC | PRN

## 2013-03-16 NOTE — Assessment & Plan Note (Signed)
stable overall by history and exam, recent data reviewed with pt, and pt to continue medical treatment as before,  to f/u any worsening symptoms or concerns SpO2 Readings from Last 3 Encounters:  03/16/13 95%  11/23/12 96%  09/17/12 96%   Also gave sample of symbicort 160

## 2013-03-16 NOTE — Progress Notes (Signed)
Pre visit review using our clinic review tool, if applicable. No additional management support is needed unless otherwise documented below in the visit note. 

## 2013-03-16 NOTE — Assessment & Plan Note (Signed)

## 2013-03-16 NOTE — Assessment & Plan Note (Signed)
Mild to mod, for antibx course,  to f/u any worsening symptoms or concerns 

## 2013-03-16 NOTE — Patient Instructions (Addendum)
Please take all new medication as prescribed - the antibiotic, and cough medicine Please continue all other medications as before, and refills have been done if requested. Please have the pharmacy call with any other refills you may need. You are also given the sample of symbicort 160/4.5 to use at 1 puff twice per day until gone, then resume the advair Please continue your efforts at being more active, low cholesterol diet, and weight control. You are otherwise up to date with prevention measures today. Please keep your appointments with your specialists as you may have planned  Please remember to followup with your GYN for the yearly pap smear and/or mammogram Please call if you change your mind about the colonoscopy with Dr Henrene Pastor  Please go to the LAB in the Basement (turn left off the elevator) for the tests to be done today You will be contacted by phone if any changes need to be made immediately.  Otherwise, you will receive a letter about your results with an explanation, but please check with MyChart first.  Please remember to sign up for MyChart if you have not done so, as this will be important to you in the future with finding out test results, communicating by private email, and scheduling acute appointments online when needed.  Please return in 6 months, or sooner if needed

## 2013-03-16 NOTE — Progress Notes (Signed)
Subjective:    Patient ID: Kimberly [name], female    DOB: 12-17-1945, 68 y.o.   MRN: [id number]  HPI  Here for wellness and f/u;  Overall doing ok;  Pt denies CP, worsening SOB, DOE, wheezing, orthopnea, PND, worsening LE edema, palpitations, dizziness or syncope, except with acute onset mild to mod 2-3 days ST, HA, general weakness and malaise, with prod cough greenish sputum.  Asks for sample med similar to advair, currently out.  Pt denies neurological change such as new headache, facial or extremity weakness.  Pt denies polydipsia, polyuria, or low sugar symptoms. Pt states overall good compliance with treatment and medications, good tolerability, and has been trying to follow lower cholesterol diet.  Pt denies worsening depressive symptoms, suicidal ideation or panic. No fever, night sweats, wt loss, loss of appetite, or other constitutional symptoms.  Pt states good ability with ADL's, has low fall risk, home safety reviewed and adequate, no other significant changes in hearing or vision, and only occasionally active with exercise. Pt plans to call facility in [facility] for her mammogram soon, declines referral, as well as declines colonoscopy.  Sees card routinely, most recent cbc, INR normal after c/o bruising to hands on ASA in jan 2015.  Pt continues to have recurring right knee pain with near giveaways but no falls, without change in severity, gait change or falls. Past Medical History  Diagnosis Date  . VSD (ventricular septal defect)   . PVOD (pulmonary veno-occlusive disease)   . COPD (chronic obstructive pulmonary disease)   . Dyslipidemia   . Anxiety   . GERD (gastroesophageal reflux disease)   . Pericarditis   . Thoracic outlet syndrome   . HTN (hypertension) 02/22/2011  . Asthma   . Blood in stool   . Ulcer   . Migraines   . Migraine 02/26/2011   Past Surgical History  Procedure Laterality Date  . Vsd repair    . Tubal ligation    . Cholecystectomy    . Cesarean section       reports that she has been smoking.  She does not have any smokeless tobacco history on file. She reports that she does not drink alcohol or use illicit drugs. family history includes Alcohol abuse in her other; Arthritis in her other and other; Cancer in her other; Hypertension in her other and another family member; Stroke in her other and another family member; Throat cancer in an other family member. Allergies  Allergen Reactions  . Codeine    Current Outpatient Prescriptions on File Prior to Visit  Medication Sig Dispense Refill  . albuterol (VENTOLIN HFA) 108 (90 BASE) MCG/ACT inhaler Inhale 2 puffs into the lungs every 6 (six) hours as needed.        . ALPRAZolam (XANAX) 0.25 MG tablet TAKE 1/2 TO 1 TABLET BY MOUTH TWICE DAILY AS NEEDED FOR STRESS  60 tablet  3  . aspirin 81 MG tablet Take 81 mg by mouth daily.        . cyclobenzaprine (FLEXERIL) 10 MG tablet Take 1 tablet (10 mg total) by mouth at bedtime as needed for muscle spasms.  30 tablet  5  . DIGOX 125 MCG tablet TAKE ONE (1) TABLET EACH DAY  90 tablet  0  . Fluticasone-Salmeterol (ADVAIR DISKUS) 250-50 MCG/DOSE AEPB Inhale 1 puff into the lungs 2 (two) times daily.  1 each  11  . furosemide (LASIX) 20 MG tablet Take 1 tablet (20 mg total) by mouth as needed (  For swelling).  90 tablet  3  . lisinopril (PRINIVIL,ZESTRIL) 5 MG tablet TAKE ONE (1) TABLET EACH DAY  90 tablet  0  . simvastatin (ZOCOR) 40 MG tablet TAKE ONE (1) TABLET EACH DAY  30 tablet  6   No current facility-administered medications on file prior to visit.   Review of Systems Constitutional: Negative for diaphoresis, activity change, appetite change or unexpected weight change.  HENT: Negative for hearing loss, ear pain, facial swelling, mouth sores and neck stiffness.   Eyes: Negative for pain, redness and visual disturbance.  Respiratory: Negative for shortness of breath and wheezing.   Cardiovascular: Negative for chest pain and palpitations.   Gastrointestinal: Negative for diarrhea, blood in stool, abdominal distention or other pain Genitourinary: Negative for hematuria, flank pain or change in urine volume.  Musculoskeletal: Negative for myalgias and joint swelling.  Skin: Negative for color change and wound.  Neurological: Negative for syncope and numbness. other than noted Hematological: Negative for adenopathy.  Psychiatric/Behavioral: Negative for hallucinations, self-injury, decreased concentration and agitation.      Objective:   Physical Exam BP 120/82  Pulse 86  Temp(Src) 98.7 F (37.1 C) (Oral)  Ht 5\' 1"  (1.549 m)  Wt 131 lb (59.421 kg)  BMI 24.76 kg/m2  SpO2 95% VS noted, mild ill Constitutional: Pt is oriented to person, place, and time. Appears well-developed and well-nourished.  Head: Normocephalic and atraumatic.  Right Ear: External ear normal.  Left Ear: External ear normal.  Nose: Nose normal.  Mouth/Throat: Oropharynx is clear and moist.  Bilat tm's with mild erythema.  Max sinus areas non tender.  Pharynx with mild erythema, no exudate Eyes: Conjunctivae and EOM are normal. Pupils are equal, round, and reactive to light.  Neck: Normal range of motion. Neck supple. No JVD present. No tracheal deviation present.  Cardiovascular: Normal rate, regular rhythm, normal heart sounds and intact distal pulses.   Pulmonary/Chest: Effort normal and breath sounds normal.  Abdominal: Soft. Bowel sounds are normal. There is no tenderness. No HSM  Musculoskeletal: Normal range of motion. Exhibits no edema.  Lymphadenopathy:  Has no cervical adenopathy.  Neurological: Pt is alert and oriented to person, place, and time. Pt has normal reflexes. No cranial nerve deficit.  Skin: Skin is warm and dry. No rash noted.  Psychiatric:  Has  normal mood and affect. Behavior is normal.     Assessment & Plan:

## 2013-03-30 ENCOUNTER — Other Ambulatory Visit: Payer: Self-pay

## 2013-03-30 DIAGNOSIS — Q210 Ventricular septal defect: Secondary | ICD-10-CM

## 2013-03-30 DIAGNOSIS — J449 Chronic obstructive pulmonary disease, unspecified: Secondary | ICD-10-CM

## 2013-03-30 DIAGNOSIS — I10 Essential (primary) hypertension: Secondary | ICD-10-CM

## 2013-03-30 DIAGNOSIS — E785 Hyperlipidemia, unspecified: Secondary | ICD-10-CM

## 2013-03-30 MED ORDER — SIMVASTATIN 40 MG PO TABS: ORAL_TABLET | ORAL | Status: DC

## 2013-03-30 MED ORDER — LISINOPRIL 5 MG PO TABS: ORAL_TABLET | ORAL | Status: DC

## 2013-03-30 MED ORDER — DIGOXIN 125 MCG PO TABS: ORAL_TABLET | ORAL | Status: DC

## 2013-03-30 MED ORDER — FUROSEMIDE 20 MG PO TABS: 20.0000 mg | ORAL_TABLET | ORAL | Status: DC | PRN

## 2013-04-04 ENCOUNTER — Encounter: Payer: Self-pay | Admitting: Internal Medicine

## 2013-04-07 ENCOUNTER — Ambulatory Visit (INDEPENDENT_AMBULATORY_CARE_PROVIDER_SITE_OTHER)
Admission: RE | Admit: 2013-04-07 | Discharge: 2013-04-07 | Disposition: A | Discharge status: Home / Self Care | Payer: Medicare PPO | Source: Ambulatory Visit | Attending: Internal Medicine | Admitting: Internal Medicine

## 2013-04-07 ENCOUNTER — Ambulatory Visit (INDEPENDENT_AMBULATORY_CARE_PROVIDER_SITE_OTHER): Payer: Medicare PPO | Admitting: Internal Medicine

## 2013-04-07 ENCOUNTER — Encounter: Payer: Self-pay | Admitting: Internal Medicine

## 2013-04-07 VITALS — BP 104/70 | HR 65 | Temp 97.1°F | Resp 13 | Wt 132.4 lb

## 2013-04-07 DIAGNOSIS — J441 Chronic obstructive pulmonary disease with (acute) exacerbation: Secondary | ICD-10-CM

## 2013-04-07 DIAGNOSIS — R0781 Pleurodynia: Secondary | ICD-10-CM

## 2013-04-07 DIAGNOSIS — R071 Chest pain on breathing: Secondary | ICD-10-CM

## 2013-04-07 DIAGNOSIS — J209 Acute bronchitis, unspecified: Secondary | ICD-10-CM

## 2013-04-07 MED ORDER — DOXYCYCLINE HYCLATE 100 MG PO TABS: 100.0000 mg | ORAL_TABLET | Freq: Two times a day (BID) | ORAL | Status: DC

## 2013-04-07 NOTE — Patient Instructions (Signed)
Plain Mucinex (NOT D) for thick secretions ;force NON dairy fluids .   Nasal cleansing in the shower as discussed with lather of mild shampoo.After 10 seconds wash off lather while  exhaling through nostrils. Make sure that all residual soap is removed to prevent irritation.  Flonase OR Nasacort AQ 1 spray in each nostril twice a day as needed. Use the "crossover" technique into opposite nostril spraying toward opposite ear @ 45 degree angle, not straight up into nostril.  Use a Neti pot daily only  as needed for significant sinus congestion; going from open side to congested side . Plain Allegra (NOT D )  160 daily , Loratidine 10 mg , OR Zyrtec 10 mg @ bedtime  as needed for itchy eyes & sneezing.  Albuterol is a rescue inhaler which should be used as infrequently as possible; it should never be used more than 1-2 puffs every 4 hours.    If it is required more than 2-3 times per week; the asthma is not well controlled. Symbicort ,a maintenance agent ,should be considered to control smooth muscle spasm and airway inflammation  and to prevent adverse effects from excess albuterol use.Those adverse effects can include health or life threatening heart rhythm irregularities. Symbicort one - two inhalations every 12 hours; gargle and spit after use

## 2013-04-07 NOTE — Progress Notes (Signed)
Pre visit review using our clinic review tool, if applicable. No additional management support is needed unless otherwise documented below in the visit note. 

## 2013-04-07 NOTE — Progress Notes (Signed)
   Subjective:    Patient ID: Kimberly [name], female    DOB: 1945/10/13, 68 y.o.   MRN: [id number]  HPI  She was seen 2 weeks ago with head congestion, sore throat, postnasal drainage. She was placed on Levaquin which has been completed  Approximately 3-4 days ago she began to have some discomfort in the left posterior inferior thorax. This is worse with deep breathing or coughing.  She has paroxysmal cough which lasts less than 30 seconds. Sputum is clear with minimal or scant green discoloration  She had some sneezing but this is mainly related to the triggers such as dust or chemical sprays .She's had some wheezing as well.  She has albuterol which she's been using up to twice a day. She has not been using her Symbicort regularly  She is presently smoking a third of a pack a day      Review of Systems She specifically denies itchy, eyes. She has no fever, chills, or sweats  She is not having nasal purulence at this time ;no frontal or facial pain. She also denies shortness of breath or hemoptysis.     Objective:   Physical Exam General appearance:adequately nourished; no acute distress or increased work of breathing is present.  No  lymphadenopathy about the head, neck, or axilla noted.   Eyes: No conjunctival inflammation or lid edema is present. There is no scleral icterus.  Ears:  External ear exam shows no significant lesions or deformities.  Otoscopic examination reveals clear canals, tympanic membranes are intact bilaterally without bulging, retraction, inflammation or discharge.  Nose:  External nasal examination shows no deformity or inflammation. Nasal mucosa are dry without lesions or exudates. No septal dislocation or deviation.No obstruction to airflow.   Oral exam: Denture upper;lower dental hygiene is good; lips and gums are healthy appearing.There is no oropharyngeal erythema or exudate noted.   Neck:  No deformities, masses, or tenderness noted.   Supple with  full range of motion without pain.   Heart:  Normal rate and regular rhythm. S1 and S2 normal without gallop, click, rub or other extra sounds. Heart sounds distant: Grade 3/5-7 systolic murmur  Lungs: Coarse rhonchi worse in left lower lobe. Upper lobes have decreased breath sounds No increased work of breathing.    Extremities:  No cyanosis, edema, or clubbing  noted    Skin: Warm & dry         Assessment & Plan:  #1 acute bronchitis with some pleuritic component #2 COPD exacerbation  See orders

## 2013-04-15 ENCOUNTER — Other Ambulatory Visit: Payer: Self-pay | Admitting: *Deleted

## 2013-04-15 DIAGNOSIS — Q210 Ventricular septal defect: Secondary | ICD-10-CM

## 2013-04-15 DIAGNOSIS — J449 Chronic obstructive pulmonary disease, unspecified: Secondary | ICD-10-CM

## 2013-04-15 DIAGNOSIS — E785 Hyperlipidemia, unspecified: Secondary | ICD-10-CM

## 2013-04-15 DIAGNOSIS — I10 Essential (primary) hypertension: Secondary | ICD-10-CM

## 2013-04-15 MED ORDER — FUROSEMIDE 20 MG PO TABS: 20.0000 mg | ORAL_TABLET | ORAL | Status: DC | PRN

## 2013-04-15 MED ORDER — DIGOXIN 125 MCG PO TABS: ORAL_TABLET | ORAL | Status: DC

## 2013-04-15 MED ORDER — LISINOPRIL 5 MG PO TABS: ORAL_TABLET | ORAL | Status: DC

## 2013-04-15 MED ORDER — SIMVASTATIN 40 MG PO TABS: ORAL_TABLET | ORAL | Status: DC

## 2013-04-20 ENCOUNTER — Other Ambulatory Visit: Payer: Self-pay

## 2013-04-20 ENCOUNTER — Telehealth: Payer: Self-pay | Admitting: Internal Medicine

## 2013-04-20 DIAGNOSIS — E785 Hyperlipidemia, unspecified: Secondary | ICD-10-CM

## 2013-04-20 DIAGNOSIS — Q210 Ventricular septal defect: Secondary | ICD-10-CM

## 2013-04-20 DIAGNOSIS — I10 Essential (primary) hypertension: Secondary | ICD-10-CM

## 2013-04-20 DIAGNOSIS — J449 Chronic obstructive pulmonary disease, unspecified: Secondary | ICD-10-CM

## 2013-04-20 MED ORDER — FUROSEMIDE 20 MG PO TABS: ORAL_TABLET | ORAL | Status: DC

## 2013-04-20 NOTE — Telephone Encounter (Signed)
New message    Pt said Dr Harrington Challenger need to [facility] for pt to have her medications mailed to her home thru [facility]. She said it will not cost her but Mcarthur Rossetti needs our permission.

## 2013-05-22 NOTE — Progress Notes (Signed)
HPI Patient is a 68 year old with a history of congenital heart disease (s/p VSD repair), PVOD, dyslipidemia, COPD and tobacco use. \  Echo in  showqed LV and RV function were stable.  LVEF was 35 to 40% with akinessi of there anteroseptum  RV was normal. She was last in clinic in September. Since I saw her she has been seen by Linna Darner  Rx doxycycline for sinus infection  She has not picked up Still with a lot of drainage  Breathing is shorter  More wheezing.  Still smoking  Watns to quit.     Allergies  Allergen Reactions  . Codeine     Nausea      Current Outpatient Prescriptions  Medication Sig Dispense Refill  . albuterol (VENTOLIN HFA) 108 (90 BASE) MCG/ACT inhaler Inhale 2 puffs into the lungs every 6 (six) hours as needed.        . ALPRAZolam (XANAX) 0.25 MG tablet TAKE 1/2 TO 1 TABLET BY MOUTH TWICE DAILY AS NEEDED FOR STRESS  60 tablet  3  . aspirin 81 MG tablet Take 81 mg by mouth daily.        . chlorpheniramine-HYDROcodone (TUSSIONEX PENNKINETIC ER) 10-8 MG/5ML LQCR Take 5 mLs by mouth at bedtime as needed.  140 mL  0  . cyclobenzaprine (FLEXERIL) 10 MG tablet Take 1 tablet (10 mg total) by mouth at bedtime as needed for muscle spasms.  30 tablet  5  . digoxin (DIGOX) 0.125 MG tablet TAKE ONE (1) TABLET EACH DAY  90 tablet  0  . doxycycline (VIBRA-TABS) 100 MG tablet Take 1 tablet (100 mg total) by mouth 2 (two) times daily.  14 tablet  0  . Fluticasone-Salmeterol (ADVAIR DISKUS) 250-50 MCG/DOSE AEPB Inhale 1 puff into the lungs 2 (two) times daily.  1 each  11  . furosemide (LASIX) 20 MG tablet Take 1 tablet daily as needed  90 tablet  1  . lisinopril (PRINIVIL,ZESTRIL) 5 MG tablet TAKE ONE (1) TABLET EACH DAY  90 tablet  0  . simvastatin (ZOCOR) 40 MG tablet TAKE ONE (1) TABLET EACH DAY  90 tablet  0   No current facility-administered medications for this visit.    Past Medical History  Diagnosis Date  . VSD (ventricular septal defect)   . PVOD (pulmonary  veno-occlusive disease)   . COPD (chronic obstructive pulmonary disease)   . Dyslipidemia   . Anxiety   . GERD (gastroesophageal reflux disease)   . Pericarditis   . Thoracic outlet syndrome   . HTN (hypertension) 02/22/2011  . Asthma   . Blood in stool   . Ulcer   . Migraines   . Migraine 02/26/2011    Past Surgical History  Procedure Laterality Date  . Vsd repair    . Tubal ligation    . Cholecystectomy    . Cesarean section      Family History  Problem Relation Age of Onset  . Throat cancer    . Hypertension    . Stroke    . Alcohol abuse Other   . Arthritis Other   . Cancer Other     lung cancer  . Hypertension Other   . Arthritis Other   . Stroke Other     History   Social History  . Marital Status: Married    Spouse Name: N/A    Number of Children: N/A  . Years of Education: 12   Occupational History  . retired    Science writer  History Main Topics  . Smoking status: Current Every Day Smoker -- 0.30 packs/day  . Smokeless tobacco: Not on file  . Alcohol Use: No  . Drug Use: No  . Sexual Activity: Not on file   Other Topics Concern  . Not on file   Social History Narrative  . No narrative on file    Review of Systems:  All systems reviewed.  They are negative to the above problem except as previously stated.  Vital Signs: BP 114/77  P 78  Wt 132 [place] is in NAD HEENT:  Normocephalic, atraumatic. EOMI, PERRLA.  Neck: JVP is normal.  No bruits.  Lungs: Decreased air flow (moderate) Heart: Regular rate and rhythm. Normal S1, S2. No S3.  III/VI systolic murmur LUSB PMI not displaced.  Abdomen:  Supple, nontender. Normal bowel sounds. No masses. No hepatomegaly.  Extremities:   Good distal pulses throughout. No lower extremity edema.  Musculoskeletal :moving all extremities.  Neuro:   alert and oriented x3.  CN II-XII grossly intact.  EKG  Ectopic atrial rhythm  66 bpm    RBBB.  Twave inversion V1-V4 Assessment and Plan:  1.  COPD  Breathing is  worse  COmplains of sinus symptoms   Will give Rx of Z pac  Also prednisone burst/taper.    2.  Edema.  Fluid status appears good.  3.  Hx VSD  S/p repair.    4.  HL   5.  Tob  Counselled  Patinet is trying to quit.

## 2013-05-23 ENCOUNTER — Ambulatory Visit (INDEPENDENT_AMBULATORY_CARE_PROVIDER_SITE_OTHER): Payer: Medicare PPO | Admitting: Internal Medicine

## 2013-05-23 ENCOUNTER — Encounter: Payer: Self-pay | Admitting: Internal Medicine

## 2013-05-23 VITALS — BP 130/70 | HR 67 | Ht 61.0 in | Wt 133.4 lb

## 2013-05-23 DIAGNOSIS — I428 Other cardiomyopathies: Secondary | ICD-10-CM

## 2013-05-23 DIAGNOSIS — I429 Cardiomyopathy, unspecified: Secondary | ICD-10-CM

## 2013-05-23 MED ORDER — AZITHROMYCIN 250 MG PO TABS: ORAL_TABLET | ORAL | Status: DC

## 2013-05-23 MED ORDER — PREDNISONE 10 MG PO TABS: ORAL_TABLET | ORAL | Status: DC

## 2013-05-23 NOTE — Patient Instructions (Signed)
Your physician wants you to follow-up in: Kimberly [name] will receive a reminder letter in the mail two months in advance. If you don't receive a letter, please call our office to schedule the follow-up appointment.   Z- PAK AS DIRECTED  PREDNISONE 10 MG AS DIRECTED

## 2013-05-24 ENCOUNTER — Other Ambulatory Visit: Payer: Self-pay | Admitting: *Deleted

## 2013-05-24 DIAGNOSIS — I429 Cardiomyopathy, unspecified: Secondary | ICD-10-CM

## 2013-05-24 MED ORDER — PREDNISONE 10 MG PO TABS: ORAL_TABLET | ORAL | Status: DC

## 2013-05-24 MED ORDER — AZITHROMYCIN 250 MG PO TABS: ORAL_TABLET | ORAL | Status: DC

## 2013-05-25 ENCOUNTER — Telehealth: Payer: Self-pay | Admitting: *Deleted

## 2013-05-25 MED ORDER — BUDESONIDE-FORMOTEROL FUMARATE 160-4.5 MCG/ACT IN AERO: 2.0000 | INHALATION_SPRAY | Freq: Two times a day (BID) | RESPIRATORY_TRACT | Status: DC

## 2013-05-25 NOTE — Telephone Encounter (Signed)
Called left message to call back 

## 2013-05-25 NOTE — Telephone Encounter (Signed)
Pt called requesting Symbicort.  Medication not on med list.  Please advise

## 2013-05-25 NOTE — Telephone Encounter (Signed)
If pt is taking the advair, she would not need the symbicort  She can have either one, but not both  Robin to contact pt

## 2013-05-25 NOTE — Telephone Encounter (Signed)
Spoke with pt she states she prefers the Symbicort.  What dosage to be Rx'd

## 2013-05-25 NOTE — Telephone Encounter (Signed)
A user error has taken place.

## 2013-05-25 NOTE — Telephone Encounter (Signed)
symbicort done erx

## 2013-05-26 ENCOUNTER — Other Ambulatory Visit: Payer: Self-pay

## 2013-05-26 MED ORDER — ALPRAZOLAM 0.25 MG PO TABS: ORAL_TABLET | ORAL | Status: DC

## 2013-05-26 NOTE — Telephone Encounter (Signed)
Patient informed. 

## 2013-05-26 NOTE — Telephone Encounter (Signed)
Done hardcopy to robin  

## 2013-05-26 NOTE — Telephone Encounter (Signed)
Faxed hardcopy to [facility]. 

## 2013-05-27 ENCOUNTER — Telehealth: Payer: Self-pay | Admitting: Internal Medicine

## 2013-05-27 NOTE — Telephone Encounter (Signed)
Patient is returning your call. Please call back.  °

## 2013-05-27 NOTE — Telephone Encounter (Signed)
Spoke with pt, last night she felt shaky inside and was rocking side to side. After eating she felt like she needed to burp but was unable to, and also felt like it was hard to breathe. She had a stomach ache, had a small Bm and finally burped. When she returned to go to bed she reports she got dizzy and her ears were hurting Her heart rate during all of this was fine by her report. She feels fine today just tired She has been extremely stressed out this week. She wants to make dr Harrington Challenger aware

## 2013-05-27 NOTE — Telephone Encounter (Signed)
New message     Last night while in bed--she could not get her breathe good---felt like she needed to burp but could not burp.  Got out of bed and was really dizzy, broke out into a cold sweat--took a xanax.  This am she feels drained.  She did not like the feeling she had last night---never had chest pain---talk to a nurse

## 2013-05-27 NOTE — Telephone Encounter (Signed)
Unable to reach pt or leave a message  

## 2013-05-28 NOTE — Telephone Encounter (Signed)
Patinet was placed on ABX and steroid burst. I would recomm she continue  Follow.

## 2013-05-31 NOTE — Telephone Encounter (Signed)
Spoke with pt, she reports she is feeling much better. Aware of dr Harrington Challenger recommendation

## 2013-06-29 ENCOUNTER — Encounter: Payer: Self-pay | Admitting: Internal Medicine

## 2013-06-29 ENCOUNTER — Ambulatory Visit (INDEPENDENT_AMBULATORY_CARE_PROVIDER_SITE_OTHER)
Admission: RE | Admit: 2013-06-29 | Discharge: 2013-06-29 | Disposition: A | Discharge status: Home / Self Care | Payer: Medicare PPO | Source: Ambulatory Visit | Attending: Internal Medicine | Admitting: Internal Medicine

## 2013-06-29 ENCOUNTER — Ambulatory Visit (INDEPENDENT_AMBULATORY_CARE_PROVIDER_SITE_OTHER): Payer: Commercial Managed Care - HMO | Admitting: Internal Medicine

## 2013-06-29 VITALS — BP 122/78 | HR 84 | Temp 98.1°F | Ht 61.0 in | Wt 132.5 lb

## 2013-06-29 DIAGNOSIS — J209 Acute bronchitis, unspecified: Secondary | ICD-10-CM

## 2013-06-29 DIAGNOSIS — I10 Essential (primary) hypertension: Secondary | ICD-10-CM

## 2013-06-29 DIAGNOSIS — J441 Chronic obstructive pulmonary disease with (acute) exacerbation: Secondary | ICD-10-CM

## 2013-06-29 DIAGNOSIS — H9190 Unspecified hearing loss, unspecified ear: Secondary | ICD-10-CM

## 2013-06-29 DIAGNOSIS — H9191 Unspecified hearing loss, right ear: Secondary | ICD-10-CM

## 2013-06-29 MED ORDER — METHYLPREDNISOLONE ACETATE 80 MG/ML IJ SUSP
80.0000 mg | Freq: Once | INTRAMUSCULAR | Status: AC
  Administered 2013-06-29: 80 mg via INTRAMUSCULAR

## 2013-06-29 MED ORDER — LEVOFLOXACIN 250 MG PO TABS: 250.0000 mg | ORAL_TABLET | Freq: Every day | ORAL | Status: DC

## 2013-06-29 MED ORDER — PREDNISONE 10 MG PO TABS: ORAL_TABLET | ORAL | Status: DC

## 2013-06-29 NOTE — Progress Notes (Signed)
Pre visit review using our clinic review tool, if applicable. No additional management support is needed unless otherwise documented below in the visit note. 

## 2013-06-29 NOTE — Assessment & Plan Note (Signed)
stable overall by history and exam, recent data reviewed with pt, and pt to continue medical treatment as before,  to f/u any worsening symptoms or concerns BP Readings from Last 3 Encounters:  06/29/13 122/78  05/23/13 130/70  04/07/13 104/70

## 2013-06-29 NOTE — Progress Notes (Signed)
Subjective:    Patient ID: Kimberly [name], female    DOB: 10/05/1945, 68 y.o.   MRN: [id number]  HPI  Here with recent onset bronchitic symtpoms, chest tightness and wheezing, some improved initially with tx with zpac and prednisone per Dr Ross/card but now worse again. Has cough med per Dr Harrington Challenger.  Pt denies chest pain, orthopnea, PND, increased LE swelling, palpitations, dizziness or syncope.  Pt denies polydipsia, polyuria.  Also with an episode of vertigo yesterday signfican for several minutes, has reduced hearing in the right ear, no falls but was wobbly on the stairs, plans to self -refer to ENT in [place] for further eval later today or tomorrow.  No high fever, chills.  Past Medical History  Diagnosis Date  . VSD (ventricular septal defect)   . PVOD (pulmonary veno-occlusive disease)   . COPD (chronic obstructive pulmonary disease)   . Dyslipidemia   . Anxiety   . GERD (gastroesophageal reflux disease)   . Pericarditis   . Thoracic outlet syndrome   . HTN (hypertension) 02/22/2011  . Asthma   . Blood in stool   . Ulcer   . Migraines   . Migraine 02/26/2011   Past Surgical History  Procedure Laterality Date  . Vsd repair    . Tubal ligation    . Cholecystectomy    . Cesarean section      reports that she has been smoking.  She does not have any smokeless tobacco history on file. She reports that she does not drink alcohol or use illicit drugs. family history includes Alcohol abuse in her other; Arthritis in her other and other; Cancer in her other; Hypertension in her other and another family member; Stroke in her other and another family member; Throat cancer in an other family member. Allergies  Allergen Reactions  . Codeine     Nausea    Current Outpatient Prescriptions on File Prior to Visit  Medication Sig Dispense Refill  . albuterol (VENTOLIN HFA) 108 (90 BASE) MCG/ACT inhaler Inhale 2 puffs into the lungs every 6 (six) hours as needed.        . ALPRAZolam (XANAX)  0.25 MG tablet TAKE 1/2 TO 1 TABLET BY MOUTH TWICE DAILY AS NEEDED FOR STRESS  60 tablet  3  . aspirin 81 MG tablet Take 81 mg by mouth daily.        . budesonide-formoterol (SYMBICORT) 160-4.5 MCG/ACT inhaler Inhale 2 puffs into the lungs 2 (two) times daily.  1 Inhaler  11  . cyclobenzaprine (FLEXERIL) 10 MG tablet Take 1 tablet (10 mg total) by mouth at bedtime as needed for muscle spasms.  30 tablet  5  . digoxin (DIGOX) 0.125 MG tablet TAKE ONE (1) TABLET EACH DAY  90 tablet  0  . furosemide (LASIX) 20 MG tablet Take 1 tablet daily as needed  90 tablet  1  . lisinopril (PRINIVIL,ZESTRIL) 5 MG tablet TAKE ONE (1) TABLET EACH DAY  90 tablet  0  . simvastatin (ZOCOR) 40 MG tablet TAKE ONE (1) TABLET EACH DAY  90 tablet  0   No current facility-administered medications on file prior to visit.    Review of Systems  Constitutional: Negative for unusual diaphoresis or other sweats  HENT: Negative for ringing in ear Eyes: Negative for double vision or worsening visual disturbance.  Respiratory: Negative for choking and stridor.   Gastrointestinal: Negative for vomiting or other signifcant bowel change Genitourinary: Negative for hematuria or decreased urine volume.  Musculoskeletal:  Negative for other MSK pain or swelling Skin: Negative for color change and worsening wound.  Neurological: Negative for tremors and numbness other than noted  Psychiatric/Behavioral: Negative for decreased concentration or agitation other than above       Objective:   Physical Exam BP 122/78  Pulse 84  Temp(Src) 98.1 F (36.7 C) (Oral)  Ht 5\' 1"  (1.549 m)  Wt 132 lb 8 oz (60.102 kg)  BMI 25.05 kg/m2  SpO2 94% VS noted, mild ill Constitutional: Pt appears well-developed, well-nourished.  HENT: Head: NCAT.  Right Ear: External ear normal.  Left Ear: External ear normal.  Eyes: . Pupils are equal, round, and reactive to light. Conjunctivae and EOM are normal Neck: Normal range of motion. Neck supple.    Bilat tm's with mild erythema.  Max sinus areas non tender.  Pharynx with mild erythema, no exudate Cardiovascular: Normal rate and regular rhythm.   Pulmonary/Chest: Effort normal and breath sounds decreased with bilat wheezing, mild  Neurological: Pt is alert. Not confused , motor grossly intact Skin: Skin is warm. No rash, no LE edema Psychiatric: Pt behavior is normal. No agitation.     Assessment & Plan:

## 2013-06-29 NOTE — Assessment & Plan Note (Addendum)
With episode vertigo yesterday, declines further tx, pt plans to self refer to ENT in [place] (Dr Richardson Landry), for mucinex otc as well

## 2013-06-29 NOTE — Patient Instructions (Signed)
You had the steroid shot today  Please take all new medication as prescribed - the antibiotic, prednisone  Please continue all other medications as before, including the cough med per Dr Harrington Challenger  Please go to the XRAY Department in the Basement (go straight as you get off the elevator) for the x-ray testing  You will be contacted by phone if any changes need to be made immediately.  Otherwise, you will receive a letter about your results with an explanation, but please check with MyChart first.  Please remember to sign up for MyChart if you have not done so, as this will be important to you in the future with finding out test results, communicating by private email, and scheduling acute appointments online when needed.  Please keep your appointments with your specialists as you may have planned

## 2013-06-29 NOTE — Assessment & Plan Note (Signed)
Mild to mod, for depomedrol IM, predpac asd, to f/u any worsening symptoms or concerns 

## 2013-06-29 NOTE — Assessment & Plan Note (Signed)
Mild to mod, for antibx course,  to f/u any worsening symptoms or concerns, cant ro pna - for cxr 

## 2013-06-30 ENCOUNTER — Telehealth: Payer: Self-pay

## 2013-06-30 DIAGNOSIS — R42 Dizziness and giddiness: Secondary | ICD-10-CM

## 2013-06-30 NOTE — Telephone Encounter (Signed)
The patient called and is hoping to have a referral put in for [facility].   Thanks!

## 2013-06-30 NOTE — Telephone Encounter (Signed)
Done referral

## 2013-07-15 ENCOUNTER — Ambulatory Visit: Payer: Medicare PPO | Admitting: Internal Medicine

## 2013-07-19 ENCOUNTER — Other Ambulatory Visit: Payer: Self-pay | Admitting: Internal Medicine

## 2013-08-16 ENCOUNTER — Encounter: Payer: Self-pay | Admitting: Internal Medicine

## 2013-10-07 ENCOUNTER — Ambulatory Visit: Payer: Medicare PPO | Admitting: Internal Medicine

## 2013-10-18 ENCOUNTER — Ambulatory Visit: Payer: Medicare PPO | Admitting: Internal Medicine

## 2013-11-17 ENCOUNTER — Other Ambulatory Visit: Payer: Self-pay

## 2013-11-17 MED ORDER — ALPRAZOLAM 0.25 MG PO TABS: ORAL_TABLET | ORAL | Status: DC

## 2013-11-17 NOTE — Telephone Encounter (Signed)
#  30 as directed until her MD returns

## 2013-11-17 NOTE — Telephone Encounter (Signed)
Dr Jenny Reichmann is out of the office today. Okay to refill?

## 2013-11-17 NOTE — Telephone Encounter (Signed)
Alprazolam has been called to [facility]. I spoke to [place].

## 2013-11-19 NOTE — Progress Notes (Signed)
HPI Patient is a 69 year old with a history of congenital heart disease (s/p VSD repair), PVOD, dyslipidemia, COPD and tobacco use. \  Echo in  showqed LV and RV function were stable.  LVEF was 35 to 40% with akinessi of there anteroseptum  RV was normal. I saw the patinet in May 2015 Since I saw her she notes that she has been under signif increased stress  Not sleeping  Not eating.  Son is source of problems SHe denies CP  Had sinus infection earlier this fall  Still has some cough with clearish sputum  Ran out of Tussionex    Allergies  Allergen Reactions  . Codeine     Nausea      Current Outpatient Prescriptions  Medication Sig Dispense Refill  . albuterol (VENTOLIN HFA) 108 (90 BASE) MCG/ACT inhaler Inhale 2 puffs into the lungs every 6 (six) hours as needed.        . ALPRAZolam (XANAX) 0.25 MG tablet TAKE 1/2 TO 1 TABLET BY MOUTH TWICE DAILY AS NEEDED FOR STRESS  30 tablet  0  . aspirin 81 MG tablet Take 81 mg by mouth daily.        . budesonide-formoterol (SYMBICORT) 160-4.5 MCG/ACT inhaler Inhale 2 puffs into the lungs 2 (two) times daily.  1 Inhaler  11  . cyclobenzaprine (FLEXERIL) 10 MG tablet Take 1 tablet (10 mg total) by mouth at bedtime as needed for muscle spasms.  30 tablet  5  . digoxin (LANOXIN) 0.125 MG tablet TAKE 1 TABLET EVERY DAY  90 tablet  1  . furosemide (LASIX) 20 MG tablet Take 1 tablet daily as needed  90 tablet  1  . levofloxacin (LEVAQUIN) 250 MG tablet Take 1 tablet (250 mg total) by mouth daily.  10 tablet  0  . lisinopril (PRINIVIL,ZESTRIL) 5 MG tablet TAKE 1 TABLET EVERY DAY  90 tablet  1  . predniSONE (DELTASONE) 10 MG tablet 3 tabs by mouth per day for 3 days,2tabs per day for 3 days,1tab per day for 3 days  18 tablet  0  . simvastatin (ZOCOR) 40 MG tablet TAKE 1 TABLET EVERY DAY  90 tablet  1   No current facility-administered medications for this visit.    Past Medical History  Diagnosis Date  . VSD (ventricular septal defect)   . PVOD  (pulmonary veno-occlusive disease)   . COPD (chronic obstructive pulmonary disease)   . Dyslipidemia   . Anxiety   . GERD (gastroesophageal reflux disease)   . Pericarditis   . Thoracic outlet syndrome   . HTN (hypertension) 02/22/2011  . Asthma   . Blood in stool   . Ulcer   . Migraines   . Migraine 02/26/2011    Past Surgical History  Procedure Laterality Date  . Vsd repair    . Tubal ligation    . Cholecystectomy    . Cesarean section      Family History  Problem Relation Age of Onset  . Throat cancer    . Hypertension    . Stroke    . Alcohol abuse Other   . Arthritis Other   . Cancer Other     lung cancer  . Hypertension Other   . Arthritis Other   . Stroke Other     History   Social History  . Marital Status: Married    Spouse Name: N/A    Number of Children: N/A  . Years of Education: 12   Occupational History  .  retired    Social History Main Topics  . Smoking status: Current Every Day Smoker -- 0.30 packs/day  . Smokeless tobacco: Not on file  . Alcohol Use: No  . Drug Use: No  . Sexual Activity: Not on file   Other Topics Concern  . Not on file   Social History Narrative  . No narrative on file    Review of Systems:  All systems reviewed.  They are negative to the above problem except as previously stated.  Vital Signs: BP 100/80  P 66  Wt 123 Patnet is in NAD HEENT:  Normocephalic, atraumatic. EOMI, PERRLA.  Neck: JVP is normal.  No bruits.  Lungs: Moving air  Minimal wheeze Heart: Regular rate and rhythm. Normal S1, S2. No S3.  II/VI systolic murmur LUSB PMI not displaced.  Abdomen:  Supple, nontender. Normal bowel sounds. No masses. No hepatomegaly.  Extremities:   Good distal pulses throughout. No lower extremity edema.  Musculoskeletal :moving all extremities.  Neuro:   alert and oriented x3.  CN II-XII grossly intact.  Assessment and Plan:  1.  COPD    Moving air OK  Will give Rx for Tussionex  Further Rx thru primary.  2.   Edema.  Fluid status appears good.  3.  Hx VSD  S/p repair.  Patinet with moderate LV dysfunction  Pulm issues and BP will not allow for titration of meds    4.  HL  Set up for fasting lipids  5.  Tob abuse  Patient admits to smoking more due to stress  Counselled on slowing down/tapering

## 2013-11-21 ENCOUNTER — Ambulatory Visit (INDEPENDENT_AMBULATORY_CARE_PROVIDER_SITE_OTHER): Payer: Commercial Managed Care - HMO | Admitting: Internal Medicine

## 2013-11-21 ENCOUNTER — Encounter: Payer: Self-pay | Admitting: Internal Medicine

## 2013-11-21 VITALS — BP 100/80 | HR 66 | Ht 61.0 in | Wt 123.0 lb

## 2013-11-21 DIAGNOSIS — E782 Mixed hyperlipidemia: Secondary | ICD-10-CM

## 2013-11-21 DIAGNOSIS — I10 Essential (primary) hypertension: Secondary | ICD-10-CM

## 2013-11-21 NOTE — Patient Instructions (Signed)
Your physician recommends that you continue on your current medications as directed. Please refer to the Current Medication list given to you today.  Your physician recommends that you return for lab work next week. (BMET, CBC, LIPIDS, AST)  Your physician wants you to follow-up in: [place].   You will receive a reminder letter in the mail two months in advance. If you don't receive a letter, please call our office to schedule the follow-up appointment.

## 2013-11-22 ENCOUNTER — Telehealth: Payer: Self-pay | Admitting: Internal Medicine

## 2013-11-22 NOTE — Telephone Encounter (Signed)
Pt requests a work in appt with Dr Jenny Reichmann today, not feeling well, and cardiac patient. Please advise.

## 2013-11-22 NOTE — Telephone Encounter (Signed)
Ok for the 3PM slot to be opened

## 2013-11-22 NOTE — Telephone Encounter (Signed)
Scheduler informed.

## 2013-11-23 ENCOUNTER — Encounter: Payer: Self-pay | Admitting: Internal Medicine

## 2013-11-23 ENCOUNTER — Ambulatory Visit (INDEPENDENT_AMBULATORY_CARE_PROVIDER_SITE_OTHER): Payer: Medicare PPO | Admitting: Internal Medicine

## 2013-11-23 VITALS — BP 110/70 | HR 65 | Temp 98.2°F | Resp 14 | Wt 123.2 lb

## 2013-11-23 DIAGNOSIS — J018 Other acute sinusitis: Secondary | ICD-10-CM

## 2013-11-23 DIAGNOSIS — J441 Chronic obstructive pulmonary disease with (acute) exacerbation: Secondary | ICD-10-CM

## 2013-11-23 DIAGNOSIS — F17200 Nicotine dependence, unspecified, uncomplicated: Secondary | ICD-10-CM

## 2013-11-23 DIAGNOSIS — Z720 Tobacco use: Secondary | ICD-10-CM

## 2013-11-23 MED ORDER — AMOXICILLIN 500 MG PO CAPS: 500.0000 mg | ORAL_CAPSULE | Freq: Three times a day (TID) | ORAL | Status: DC

## 2013-11-23 NOTE — Patient Instructions (Addendum)
Plain Mucinex (NOT D) for thick secretions ;force NON dairy fluids .   Nasal cleansing in the shower as discussed with lather of mild shampoo.After 10 seconds wash off lather while  exhaling through nostrils. Make sure that all residual soap is removed to prevent irritation.  Flonase OR Nasacort AQ 1 spray in each nostril twice a day as needed. Use the "crossover" technique into opposite nostril spraying toward opposite ear @ 45 degree angle, not straight up into nostril.  Use a Neti pot daily only  as needed for significant sinus congestion; going from open side to congested side . Plain Allegra (NOT D )  160 daily , Loratidine 10 mg , OR Zyrtec 10 mg @ bedtime  as needed for itchy eyes & sneezing.  Please think about quitting smoking. Review the risks we discussed. Please call 1-800-QUIT-NOW (705)811-4291) for free smoking cessation counseling. There are multiple options are to help you stop smoking. These include nicotine patches, nicotine gum, and the new "E cigarette".  Symbicort  one - two inhalations every 12 hours; gargle and spit after use ( Lot # [id number]; Exp 09/2014)

## 2013-11-23 NOTE — Progress Notes (Signed)
   Subjective:    Patient ID: Kimberly [name], female    DOB: 1945-03-17, 68 y.o.   MRN: [id number]  HPI   Symptoms began 10/3013 as a dry cough  Over the next 48 hours she developed progressively more severe malaise  She's had frontal headaches.  As of 11/2 she started sneezing and began to produce pale green nasal secretions  She had lesser volume of yellow sputum.  She saw her Cardiologist 11/2;  Tussionex was prescribed for the cough  She has smoked for 44 years up to half a pack a day with intermittent smoking cessation. She states she presently smokes a third of a pack a day  She has only been using Symbicort a few times a week.    Review of Systems   She is not having significant facial pain.  She recently had an otic tube placed for eustachian tube dysfunction on the right by her Otolaryngologist.     Objective:   Physical Exam   Positive or pertinent findings include: Markedly weathered facies Upper dental plate and lower partial There is an otic tube on the right without associated erythema or purulence. Decreased range of motion of the cervical spine Course rhonchi greater anteriorly than posteriorly; present in all lung fields.  General appearance :adequately nourished; in no distress. Eyes: No conjunctival inflammation or scleral icterus is present. Oral exam: Lips and gums are healthy appearing.There is no oropharyngeal erythema or exudate noted.  Heart:  Normal rate and regular rhythm. S1 and S2 normal without gallop, murmur, click, rub or other extra sounds   Lungs:No increased work of breathing.  Abdomen: bowel sounds normal, soft and non-tender without masses, organomegaly or hernias noted.  No guarding or rebound.  Vascular : all pulses equal ; no bruits present. Skin:Warm & dry.  Intact without suspicious lesions or rashes ; no jaundice or tenting Lymphatic: No lymphadenopathy is noted about the head, neck, axilla.           Assessment & Plan:    #1 rhinosinusitis  #2 significant bronchitis with bronchospasm & COPD exacerbation #3 smoker; risks discussed in detail  Plan: Nasal hygiene interventions discussed. See prescription medications

## 2013-11-23 NOTE — Progress Notes (Signed)
Pre visit review using our clinic review tool, if applicable. No additional management support is needed unless otherwise documented below in the visit note. 

## 2013-11-24 ENCOUNTER — Telehealth: Payer: Self-pay | Admitting: Internal Medicine

## 2013-11-24 NOTE — Telephone Encounter (Signed)
emmi emailed °

## 2013-11-28 ENCOUNTER — Telehealth: Payer: Self-pay | Admitting: Internal Medicine

## 2013-11-28 MED ORDER — CEFUROXIME AXETIL 500 MG PO TABS: 500.0000 mg | ORAL_TABLET | Freq: Two times a day (BID) | ORAL | Status: DC

## 2013-11-28 NOTE — Telephone Encounter (Signed)
Patient aware.

## 2013-11-28 NOTE — Telephone Encounter (Signed)
Pt called in and said that she is not much better, she seen dr hopper last week and still not getting better on meds.  Whats to know if something more can be called in to help

## 2013-11-28 NOTE — Telephone Encounter (Signed)
Generic Ceftin 500 mg bid #14 in place of Amoxicillin.

## 2013-12-26 ENCOUNTER — Telehealth: Payer: Self-pay | Admitting: Internal Medicine

## 2013-12-26 NOTE — Telephone Encounter (Signed)
New message         Pt would like for nurse to give her a call asap

## 2013-12-26 NOTE — Telephone Encounter (Signed)
Patient describes these episodes - 3 in 1 and half months--body feels like its vibrating, head swimmy, these last 2-3 minutes.  Today was the third time--during the episode she felt herself falling--did not hit head, caught herself and sat down on floor, called for husband.  Thinks she may have almost blacked out, or did black out for just a second.  States her body was quivering during this time.   I recommended pt go to ED for evaluation. She refused to go to "this hospital here" and states honey I'm fine now, I ate breakfast and lunch and drove to the grocery store." Added to Dr. Alan Ripper scheduled tomorrow 11:45 am, and informed Dr. Harrington Challenger.  Advised patient that she needs to go to ED if this occurs again.  She verbalizes understanding, and states will come to [facility] if needed.  Otherwise, Dr. Harrington Challenger will see her tomorrow.

## 2013-12-27 ENCOUNTER — Ambulatory Visit (INDEPENDENT_AMBULATORY_CARE_PROVIDER_SITE_OTHER): Payer: Commercial Managed Care - HMO | Admitting: Internal Medicine

## 2013-12-27 ENCOUNTER — Encounter: Payer: Self-pay | Admitting: Internal Medicine

## 2013-12-27 VITALS — BP 110/60 | HR 68 | Ht 61.0 in | Wt 122.0 lb

## 2013-12-27 DIAGNOSIS — R55 Syncope and collapse: Secondary | ICD-10-CM

## 2013-12-27 NOTE — Patient Instructions (Signed)
**Note De-Identified Chantay Whitelock Obfuscation** Your physician recommends that you continue on your current medications as directed. Please refer to the Current Medication list given to you today.  Your physician has recommended that you wear an event monitor for 30 days. Event monitors are medical devices that record the heart's electrical activity. Doctors most often [place] these monitors to diagnose arrhythmias. Arrhythmias are problems with the speed or rhythm of the heartbeat. The monitor is a small, portable device. You can wear one while you do your normal daily activities. This is usually used to diagnose what is causing palpitations/syncope (passing out).  Your physician has requested that you have a carotid duplex. This test is an ultrasound of the carotid arteries in your neck. It looks at blood flow through these arteries that supply the brain with blood. Allow one hour for this exam. There are no restrictions or special instructions.  Your physician has requested that you have an echocardiogram. Echocardiography is a painless test that uses sound waves to create images of your heart. It provides your doctor with information about the size and shape of your heart and how well your heart's chambers and valves are working. This procedure takes approximately one hour. There are no restrictions for this procedure.  Your physician recommends that you schedule a follow-up appointment in: as previously planned

## 2013-12-27 NOTE — Progress Notes (Signed)
HPI Patient is a 68 year old with a history of congenital heart disease (s/p VSD repair), PVOD, dyslipidemia, COPD and tobacco use. \  Echo in  showqed LV and RV function were stable.  LVEF was 35 to 40% with akinessi of there anteroseptum  RV was normal. I saw the patinet in October  The patinet called in yesterday  Was having spells of feeling her body vibrating.  Almost passed out  Has had severalover the past month   Worst was yesterday  Again, admits that she is under increasd stress.   Allergies  Allergen Reactions  . Codeine     Nausea      Current Outpatient Prescriptions  Medication Sig Dispense Refill  . ALPRAZolam (XANAX) 0.25 MG tablet TAKE 1/2 TO 1 TABLET BY MOUTH TWICE DAILY AS NEEDED FOR STRESS 30 tablet 0  . aspirin 81 MG tablet Take 81 mg by mouth daily.      . budesonide-formoterol (SYMBICORT) 160-4.5 MCG/ACT inhaler Inhale 2 puffs into the lungs 2 (two) times daily. 1 Inhaler 11  . digoxin (LANOXIN) 0.125 MG tablet TAKE 1 TABLET EVERY DAY 90 tablet 1  . furosemide (LASIX) 20 MG tablet Take 1 tablet daily as needed 90 tablet 1  . lisinopril (PRINIVIL,ZESTRIL) 5 MG tablet TAKE 1 TABLET EVERY DAY 90 tablet 1  . Omeprazole (PRILOSEC PO) Take 20 mg by mouth as needed.    . simvastatin (ZOCOR) 40 MG tablet TAKE 1 TABLET EVERY DAY 90 tablet 1  . albuterol (VENTOLIN HFA) 108 (90 BASE) MCG/ACT inhaler Inhale 2 puffs into the lungs every 6 (six) hours as needed.      . cyclobenzaprine (FLEXERIL) 10 MG tablet Take 1 tablet (10 mg total) by mouth at bedtime as needed for muscle spasms. (Patient not taking: Reported on 12/27/2013) 30 tablet 5   No current facility-administered medications for this visit.    Past Medical History  Diagnosis Date  . VSD (ventricular septal defect)   . PVOD (pulmonary veno-occlusive disease)   . COPD (chronic obstructive pulmonary disease)   . Dyslipidemia   . Anxiety   . GERD (gastroesophageal reflux disease)   . Pericarditis   . Thoracic  outlet syndrome   . HTN (hypertension) 02/22/2011  . Asthma   . Blood in stool   . Ulcer   . Migraines   . Migraine 02/26/2011    Past Surgical History  Procedure Laterality Date  . Vsd repair    . Tubal ligation    . Cholecystectomy    . Cesarean section      Family History  Problem Relation Age of Onset  . Throat cancer    . Hypertension    . Stroke    . Alcohol abuse Other   . Arthritis Other   . Cancer Other     lung cancer  . Hypertension Other   . Arthritis Other   . Stroke Other     History   Social History  . Marital Status: Married    Spouse Name: N/A    Number of Children: N/A  . Years of Education: 12   Occupational History  . retired    Social History Main Topics  . Smoking status: Current Every Day Smoker -- 0.30 packs/day  . Smokeless tobacco: Not on file     Comment: Smoked 1971-present , up to 1/2 ppd. Intermittent smoking cessation with preganancy up to 1/2-1 year @ a time  . Alcohol Use: No  . Drug Use:  No  . Sexual Activity: Not on file   Other Topics Concern  . Not on file   Social History Narrative    Review of Systems:  All systems reviewed.  They are negative to the above problem except as previously stated.  Vital Signs: BP 110/60   P 68 Patnet is in NAD HEENT:  Normocephalic, atraumatic. EOMI, PERRLA.  Neck: JVP is normal.  No bruits.  Lungs: Moving air  Minimal wheeze Heart: Regular rate and rhythm. Normal S1, S2. No S3.  II/VI systolic murmur LUSB PMI not displaced.  Abdomen:  Supple, nontender. Normal bowel sounds. No masses. No hepatomegaly.  Extremities:   Good distal pulses throughout. No lower extremity edema.  Musculoskeletal :moving all extremities.  Neuro:   alert and oriented x3.  CN II-XII grossly intact.  Assessment and Plan: 1.  Spells  Concerning  Near syncpe  Would set up for repeat echo as well as an event monitor    1.  COPD   No recent exacerbation    2.  Edema.  Fluid status appears good.  3.  Hx VSD   S/p repair.  Repeat echo    4.  HL COntinue statin    5.  Tob abuse  Patient admits to smoking more due to stress  Counselled on slowing down/tapering

## 2013-12-30 ENCOUNTER — Telehealth: Payer: Self-pay | Admitting: Internal Medicine

## 2013-12-30 MED ORDER — ALPRAZOLAM 0.25 MG PO TABS: ORAL_TABLET | ORAL | Status: DC

## 2013-12-30 NOTE — Telephone Encounter (Signed)
Patient is requesting refill on xanax.  Is requesting 60 at a time b/c of cost.  [facility] [place] ph# [phone]

## 2013-12-30 NOTE — Telephone Encounter (Signed)
Did not receive hardcopy

## 2013-12-30 NOTE — Telephone Encounter (Signed)
Done hardcopy to robin  

## 2013-12-30 NOTE — Telephone Encounter (Signed)
Done hardcopy this time -

## 2014-01-02 NOTE — Telephone Encounter (Signed)
Please help, doctor Jenny Reichmann is not in office.

## 2014-01-02 NOTE — Telephone Encounter (Signed)
Called refill into pharmacy spoke with Altha Harm gave md approval for xanax. Notified pt rx has been call to pharmacy...Kimberly [name]

## 2014-01-04 ENCOUNTER — Ambulatory Visit (HOSPITAL_COMMUNITY): Payer: Commercial Managed Care - HMO | Attending: Cardiology

## 2014-01-04 ENCOUNTER — Ambulatory Visit (HOSPITAL_BASED_OUTPATIENT_CLINIC_OR_DEPARTMENT_OTHER): Payer: Commercial Managed Care - HMO | Admitting: Cardiology

## 2014-01-04 ENCOUNTER — Telehealth: Payer: Self-pay | Admitting: *Deleted

## 2014-01-04 ENCOUNTER — Encounter: Payer: Self-pay | Admitting: *Deleted

## 2014-01-04 DIAGNOSIS — G43909 Migraine, unspecified, not intractable, without status migrainosus: Secondary | ICD-10-CM | POA: Diagnosis not present

## 2014-01-04 DIAGNOSIS — E785 Hyperlipidemia, unspecified: Secondary | ICD-10-CM | POA: Diagnosis not present

## 2014-01-04 DIAGNOSIS — R55 Syncope and collapse: Secondary | ICD-10-CM

## 2014-01-04 DIAGNOSIS — R079 Chest pain, unspecified: Secondary | ICD-10-CM | POA: Diagnosis not present

## 2014-01-04 DIAGNOSIS — F17210 Nicotine dependence, cigarettes, uncomplicated: Secondary | ICD-10-CM | POA: Insufficient documentation

## 2014-01-04 DIAGNOSIS — I429 Cardiomyopathy, unspecified: Secondary | ICD-10-CM | POA: Insufficient documentation

## 2014-01-04 DIAGNOSIS — I739 Peripheral vascular disease, unspecified: Secondary | ICD-10-CM | POA: Diagnosis not present

## 2014-01-04 DIAGNOSIS — R05 Cough: Secondary | ICD-10-CM | POA: Diagnosis not present

## 2014-01-04 DIAGNOSIS — I340 Nonrheumatic mitral (valve) insufficiency: Secondary | ICD-10-CM | POA: Diagnosis not present

## 2014-01-04 DIAGNOSIS — M542 Cervicalgia: Secondary | ICD-10-CM | POA: Insufficient documentation

## 2014-01-04 DIAGNOSIS — J209 Acute bronchitis, unspecified: Secondary | ICD-10-CM | POA: Diagnosis not present

## 2014-01-04 DIAGNOSIS — F419 Anxiety disorder, unspecified: Secondary | ICD-10-CM | POA: Diagnosis not present

## 2014-01-04 DIAGNOSIS — I319 Disease of pericardium, unspecified: Secondary | ICD-10-CM | POA: Diagnosis not present

## 2014-01-04 DIAGNOSIS — Q210 Ventricular septal defect: Secondary | ICD-10-CM | POA: Diagnosis not present

## 2014-01-04 DIAGNOSIS — J449 Chronic obstructive pulmonary disease, unspecified: Secondary | ICD-10-CM | POA: Insufficient documentation

## 2014-01-04 DIAGNOSIS — R5383 Other fatigue: Secondary | ICD-10-CM | POA: Insufficient documentation

## 2014-01-04 DIAGNOSIS — I071 Rheumatic tricuspid insufficiency: Secondary | ICD-10-CM | POA: Diagnosis not present

## 2014-01-04 DIAGNOSIS — R42 Dizziness and giddiness: Secondary | ICD-10-CM

## 2014-01-04 DIAGNOSIS — I10 Essential (primary) hypertension: Secondary | ICD-10-CM | POA: Insufficient documentation

## 2014-01-04 DIAGNOSIS — I6523 Occlusion and stenosis of bilateral carotid arteries: Secondary | ICD-10-CM

## 2014-01-04 NOTE — Progress Notes (Signed)
Patient ID: Kimberly [name], female   DOB: 12-26-1945, 68 y.o.   MRN: [id number] [employer] verite 30 day cardiac event monitor applied to patient.

## 2014-01-04 NOTE — Progress Notes (Signed)
2D Echo completed. 01/04/2014

## 2014-01-04 NOTE — Progress Notes (Signed)
Carotid duplex performed 

## 2014-01-04 NOTE — Telephone Encounter (Signed)
Received serious notification from [employer] services. Pt had monitor placed this am. She has bigeminy noted at 7:45 am (CT) Pt was here having monitor applied this am, and then had appointments in echo and PV following that. Showed printout to Nell Range, PA (flex DOD). Nothing new to do currently, wait on echo and carotid results.

## 2014-01-09 ENCOUNTER — Telehealth: Payer: Self-pay | Admitting: *Deleted

## 2014-01-09 DIAGNOSIS — E049 Nontoxic goiter, unspecified: Secondary | ICD-10-CM

## 2014-01-09 NOTE — Telephone Encounter (Signed)
Informed patient someone will contact her to set up thyroid US

## 2014-01-09 NOTE — Telephone Encounter (Signed)
-----   Message from Fay Records, MD sent at 01/08/2014 11:38 PM EST ----- Carotid ultrasound shows mild plaquing of internal carotid. No new recomm USN also picked up thryoid  There is ? Goiter.  Would sched neck USN to evaluate

## 2014-01-10 NOTE — Telephone Encounter (Signed)
Ok. Refill written for hycodan syrup 1/2 to 1 tsp qhs prn cough, # 60 mL, no refills. Richardson Dopp, PA-C   01/10/2014 1:33 PM

## 2014-01-10 NOTE — Telephone Encounter (Signed)
-----   Message from Fay Records, MD sent at 01/09/2014  6:25 PM EST ----- Regarding: RE: refill cough medicine Yes ----- Message -----    From: Rodman Key, RN    Sent: 01/09/2014   6:00 PM      To: Fay Records, MD Subject: refill cough medicine                          Spoke with patient re test results.  She asked for a script to be mailed to her for hycodan syrup.  She's going to run out in about a week or so.  She takes it at night a tsp or 1/2 tsp, not every night.  Will you fill this?  Foday Cone

## 2014-01-10 NOTE — Telephone Encounter (Signed)
Discussed with Richardson Dopp PA-C He will write script for hycodan. Will leave at front desk for patient to pick up. Patient called to make aware.  Left voicemail to pick up prescription.

## 2014-01-16 ENCOUNTER — Telehealth: Payer: Self-pay | Admitting: Internal Medicine

## 2014-01-16 NOTE — Telephone Encounter (Deleted)
error 

## 2014-01-17 ENCOUNTER — Telehealth: Payer: Self-pay | Admitting: Internal Medicine

## 2014-01-17 ENCOUNTER — Encounter: Payer: Self-pay | Admitting: Internal Medicine

## 2014-01-17 ENCOUNTER — Ambulatory Visit (HOSPITAL_COMMUNITY): Payer: Commercial Managed Care - HMO

## 2014-01-17 ENCOUNTER — Ambulatory Visit (INDEPENDENT_AMBULATORY_CARE_PROVIDER_SITE_OTHER): Payer: Commercial Managed Care - HMO | Admitting: Internal Medicine

## 2014-01-17 ENCOUNTER — Ambulatory Visit (INDEPENDENT_AMBULATORY_CARE_PROVIDER_SITE_OTHER)
Admission: RE | Admit: 2014-01-17 | Discharge: 2014-01-17 | Disposition: A | Discharge status: Home / Self Care | Payer: Commercial Managed Care - HMO | Source: Ambulatory Visit | Attending: Internal Medicine | Admitting: Internal Medicine

## 2014-01-17 VITALS — BP 108/68 | HR 73 | Temp 98.6°F | Ht 61.0 in | Wt 122.2 lb

## 2014-01-17 DIAGNOSIS — Z720 Tobacco use: Secondary | ICD-10-CM

## 2014-01-17 DIAGNOSIS — F17200 Nicotine dependence, unspecified, uncomplicated: Secondary | ICD-10-CM

## 2014-01-17 DIAGNOSIS — J441 Chronic obstructive pulmonary disease with (acute) exacerbation: Secondary | ICD-10-CM

## 2014-01-17 DIAGNOSIS — J209 Acute bronchitis, unspecified: Secondary | ICD-10-CM

## 2014-01-17 DIAGNOSIS — I10 Essential (primary) hypertension: Secondary | ICD-10-CM

## 2014-01-17 MED ORDER — LEVOFLOXACIN 250 MG PO TABS: 250.0000 mg | ORAL_TABLET | Freq: Every day | ORAL | Status: DC

## 2014-01-17 MED ORDER — PREDNISONE 10 MG PO TABS: ORAL_TABLET | ORAL | Status: DC

## 2014-01-17 MED ORDER — BUDESONIDE-FORMOTEROL FUMARATE 160-4.5 MCG/ACT IN AERO: 2.0000 | INHALATION_SPRAY | Freq: Two times a day (BID) | RESPIRATORY_TRACT | Status: DC

## 2014-01-17 MED ORDER — HYDROCODONE-HOMATROPINE 5-1.5 MG/5ML PO SYRP: 5.0000 mL | ORAL_SOLUTION | Freq: Four times a day (QID) | ORAL | Status: DC | PRN

## 2014-01-17 NOTE — Progress Notes (Signed)
Subjective:    Patient ID: Kimberly [name], female    DOB: 1945/03/19, 68 y.o.   MRN: [id number]  HPI  Here with acute onset mild to mod 2-3 days ST, HA, general weakness and malaise, with prod cough greenish sputum, but Pt denies chest pain, increased sob or doe, wheezing, orthopnea, PND, increased LE swelling, palpitations, dizziness, except for onset mild wheezing/sob last pm.  Better with inhale use.   Still smoking - plans to try to quit jan 1, prior 1 ppd x 40 yrs. For start low dose CT lung Ca screening;  Per CMS guidelines, I have determined the eligibility including patient age (43-80), and absence of any signs of lung cancer.  Specific calculation of number of pack years is documented.  Quit smoking years is documented.  Shared decision making engaged today, including a discussion of the benefits and harms of screening, discussion of need for followup with additional testing, risks of over-diagnosis, risk of false-positive screening examinations, and risk of radiation exposure.  Counseling done today on the importance of adherence to annual lunge cancer LDCT screening, importance of quit smoking and remaining quit, and tobacco cessation instructions given.  There is also discussion with patient regarding the impact of comorbidities, and patient states is able and willing to undergo diagnosis and treatment. Past Medical History  Diagnosis Date  . VSD (ventricular septal defect)   . PVOD (pulmonary veno-occlusive disease)   . COPD (chronic obstructive pulmonary disease)   . Dyslipidemia   . Anxiety   . GERD (gastroesophageal reflux disease)   . Pericarditis   . Thoracic outlet syndrome   . HTN (hypertension) 02/22/2011  . Asthma   . Blood in stool   . Ulcer   . Migraines   . Migraine 02/26/2011   Past Surgical History  Procedure Laterality Date  . Vsd repair    . Tubal ligation    . Cholecystectomy    . Cesarean section      reports that she has been smoking.  She does not have any  smokeless tobacco history on file. She reports that she does not drink alcohol or use illicit drugs. family history includes Alcohol abuse in her other; Arthritis in her other and other; Cancer in her other; Hypertension in her other and another family member; Stroke in her other and another family member; Throat cancer in an other family member. Allergies  Allergen Reactions  . Codeine     Nausea    Current Outpatient Prescriptions on File Prior to Visit  Medication Sig Dispense Refill  . albuterol (VENTOLIN HFA) 108 (90 BASE) MCG/ACT inhaler Inhale 2 puffs into the lungs every 6 (six) hours as needed.      . ALPRAZolam (XANAX) 0.25 MG tablet TAKE 1/2 TO 1 TABLET BY MOUTH TWICE DAILY AS NEEDED FOR STRESS 60 tablet 1  . aspirin 81 MG tablet Take 81 mg by mouth daily.      . budesonide-formoterol (SYMBICORT) 160-4.5 MCG/ACT inhaler Inhale 2 puffs into the lungs 2 (two) times daily. 1 Inhaler 11  . cyclobenzaprine (FLEXERIL) 10 MG tablet Take 1 tablet (10 mg total) by mouth at bedtime as needed for muscle spasms. 30 tablet 5  . digoxin (LANOXIN) 0.125 MG tablet TAKE 1 TABLET EVERY DAY 90 tablet 1  . furosemide (LASIX) 20 MG tablet Take 1 tablet daily as needed 90 tablet 1  . lisinopril (PRINIVIL,ZESTRIL) 5 MG tablet TAKE 1 TABLET EVERY DAY 90 tablet 1  . Omeprazole (PRILOSEC PO)  Take 20 mg by mouth as needed.    . simvastatin (ZOCOR) 40 MG tablet TAKE 1 TABLET EVERY DAY 90 tablet 1   No current facility-administered medications on file prior to visit.   Review of Systems  Constitutional: Negative for unusual diaphoresis or other sweats  HENT: Negative for ringing in ear Eyes: Negative for double vision or worsening visual disturbance.  Respiratory: Negative for choking and stridor.   Gastrointestinal: Negative for vomiting or other signifcant bowel change Genitourinary: Negative for hematuria or decreased urine volume.  Musculoskeletal: Negative for other MSK pain or swelling Skin:  Negative for color change and worsening wound.  Neurological: Negative for tremors and numbness other than noted  Psychiatric/Behavioral: Negative for decreased concentration or agitation other than above       Objective:   Physical Exam BP 108/68 mmHg  Pulse 73  Temp(Src) 98.6 F (37 C) (Oral)  Ht 5\' 1"  (1.549 m)  Wt 122 lb 4 oz (55.452 kg)  BMI 23.11 kg/m2  SpO2 92% VS noted, mild ill Constitutional: Pt appears well-developed, well-nourished.  HENT: Head: NCAT.  Right Ear: External ear normal.  Left Ear: External ear normal.  Bilat tm's with mild erythema.  Max sinus areas non tender.  Pharynx with mild erythema, no exudate Eyes: . Pupils are equal, round, and reactive to light. Conjunctivae and EOM are normal Neck: Normal range of motion. Neck supple.  Cardiovascular: Normal rate and regular rhythm.   Pulmonary/Chest: Effort normal and breath sounds without rales but with mild few wheezing.  bilat Neurological: Pt is alert. Not confused , motor grossly intact Skin: Skin is warm. No rash Psychiatric: Pt behavior is normal. No agitation. mild nervous   Assessment & Plan:

## 2014-01-17 NOTE — Assessment & Plan Note (Signed)
/  Mild to mod, for predpac asd,  to f/u any worsening symptoms or concerns 

## 2014-01-17 NOTE — Assessment & Plan Note (Addendum)
Mild to mod, for antibx course,  to f/u any worsening symptoms or concerns, also cant r/o pna, for cxr today, cough med prn

## 2014-01-17 NOTE — Telephone Encounter (Signed)
New message        Pt would like for you to shred the prescription that is up front for her   she does not need it

## 2014-01-17 NOTE — Assessment & Plan Note (Signed)
stable overall by history and exam, recent data reviewed with pt, and pt to continue medical treatment as before,  to f/u any worsening symptoms or concerns BP Readings from Last 3 Encounters:  01/17/14 108/68  12/27/13 110/60  11/23/13 110/70

## 2014-01-17 NOTE — Assessment & Plan Note (Signed)
Now at 1 ppd for 40 yrs, for Low dose CT yearly lung cancer screening to start

## 2014-01-17 NOTE — Progress Notes (Signed)
Pre visit review using our clinic review tool, if applicable. No additional management support is needed unless otherwise documented below in the visit note. 

## 2014-01-17 NOTE — Telephone Encounter (Addendum)
LMTCB- looked for script at the front desk in forms and in samples and no prescription found.

## 2014-01-17 NOTE — Patient Instructions (Addendum)
Please take all new medication as prescribed  - the antibiotic, cough medicine, prednisone  Please quit smoking  Please continue all other medications as before, and refills have been done if requested - the symbicort  Please have the pharmacy call with any other refills you may need.  Please keep your appointments with your specialists as you may have planned  You will be contacted regarding the referral for: CT scan for chest for lung cancer screening purpose  Please go to the XRAY Department in the Basement (go straight as you get off the elevator) for the x-ray testing  You will be contacted by phone if any changes need to be made immediately.  Otherwise, you will receive a letter about your results with an explanation, but please check with MyChart first.  Please remember to sign up for MyChart if you have not done so, as this will be important to you in the future with finding out test results, communicating by private email, and scheduling acute appointments online when needed.

## 2014-01-23 ENCOUNTER — Encounter (HOSPITAL_COMMUNITY): Payer: Self-pay | Admitting: *Deleted

## 2014-01-23 ENCOUNTER — Inpatient Hospital Stay (HOSPITAL_COMMUNITY)
Admission: EM | Admit: 2014-01-23 | Discharge: 2014-01-24 | DRG: 312 | Disposition: A | Discharge status: Home / Self Care | Payer: Commercial Managed Care - HMO | Attending: Cardiovascular Disease | Admitting: Cardiovascular Disease

## 2014-01-23 ENCOUNTER — Emergency Department (HOSPITAL_COMMUNITY): Payer: Commercial Managed Care - HMO

## 2014-01-23 DIAGNOSIS — G43909 Migraine, unspecified, not intractable, without status migrainosus: Secondary | ICD-10-CM | POA: Diagnosis present

## 2014-01-23 DIAGNOSIS — J449 Chronic obstructive pulmonary disease, unspecified: Secondary | ICD-10-CM | POA: Diagnosis present

## 2014-01-23 DIAGNOSIS — J45909 Unspecified asthma, uncomplicated: Secondary | ICD-10-CM | POA: Diagnosis present

## 2014-01-23 DIAGNOSIS — I429 Cardiomyopathy, unspecified: Secondary | ICD-10-CM | POA: Diagnosis present

## 2014-01-23 DIAGNOSIS — E785 Hyperlipidemia, unspecified: Secondary | ICD-10-CM | POA: Diagnosis present

## 2014-01-23 DIAGNOSIS — I2510 Atherosclerotic heart disease of native coronary artery without angina pectoris: Secondary | ICD-10-CM | POA: Diagnosis present

## 2014-01-23 DIAGNOSIS — Z9049 Acquired absence of other specified parts of digestive tract: Secondary | ICD-10-CM | POA: Diagnosis present

## 2014-01-23 DIAGNOSIS — Q210 Ventricular septal defect: Secondary | ICD-10-CM

## 2014-01-23 DIAGNOSIS — Z720 Tobacco use: Secondary | ICD-10-CM | POA: Diagnosis not present

## 2014-01-23 DIAGNOSIS — R55 Syncope and collapse: Secondary | ICD-10-CM | POA: Diagnosis present

## 2014-01-23 DIAGNOSIS — G540 Brachial plexus disorders: Secondary | ICD-10-CM | POA: Diagnosis present

## 2014-01-23 DIAGNOSIS — F17210 Nicotine dependence, cigarettes, uncomplicated: Secondary | ICD-10-CM | POA: Diagnosis present

## 2014-01-23 DIAGNOSIS — F17200 Nicotine dependence, unspecified, uncomplicated: Secondary | ICD-10-CM | POA: Diagnosis present

## 2014-01-23 DIAGNOSIS — I472 Ventricular tachycardia, unspecified: Secondary | ICD-10-CM

## 2014-01-23 DIAGNOSIS — I420 Dilated cardiomyopathy: Secondary | ICD-10-CM

## 2014-01-23 DIAGNOSIS — K219 Gastro-esophageal reflux disease without esophagitis: Secondary | ICD-10-CM | POA: Diagnosis present

## 2014-01-23 DIAGNOSIS — F419 Anxiety disorder, unspecified: Secondary | ICD-10-CM | POA: Diagnosis present

## 2014-01-23 DIAGNOSIS — I493 Ventricular premature depolarization: Secondary | ICD-10-CM | POA: Diagnosis present

## 2014-01-23 DIAGNOSIS — I10 Essential (primary) hypertension: Secondary | ICD-10-CM | POA: Diagnosis present

## 2014-01-23 DIAGNOSIS — I471 Supraventricular tachycardia: Secondary | ICD-10-CM

## 2014-01-23 DIAGNOSIS — Z885 Allergy status to narcotic agent status: Secondary | ICD-10-CM | POA: Diagnosis not present

## 2014-01-23 DIAGNOSIS — Z87891 Personal history of nicotine dependence: Secondary | ICD-10-CM | POA: Diagnosis present

## 2014-01-23 DIAGNOSIS — J441 Chronic obstructive pulmonary disease with (acute) exacerbation: Secondary | ICD-10-CM | POA: Diagnosis present

## 2014-01-23 HISTORY — DX: Stress incontinence (female) (male): N39.3

## 2014-01-23 HISTORY — DX: Frequency of micturition: R35.0

## 2014-01-23 HISTORY — DX: Other supraventricular tachycardia: I47.19

## 2014-01-23 HISTORY — DX: Personal history of other diseases of the digestive system: Z87.19

## 2014-01-23 HISTORY — DX: Pneumonia, unspecified organism: J18.9

## 2014-01-23 HISTORY — DX: Supraventricular tachycardia: I47.1

## 2014-01-23 HISTORY — DX: Cardiac murmur, unspecified: R01.1

## 2014-01-23 HISTORY — DX: Ventricular premature depolarization: I49.3

## 2014-01-23 HISTORY — DX: Syncope and collapse: R55

## 2014-01-23 HISTORY — DX: Heart failure, unspecified: I50.9

## 2014-01-23 HISTORY — DX: Personal history of peptic ulcer disease: Z87.11

## 2014-01-23 HISTORY — DX: Unspecified chronic bronchitis: J42

## 2014-01-23 LAB — BASIC METABOLIC PANEL
Anion gap: 10 (ref 5–15)
BUN: 16 mg/dL (ref 6–23)
CO2: 25 mmol/L (ref 19–32)
Calcium: 9.2 mg/dL (ref 8.4–10.5)
Chloride: 101 mEq/L (ref 96–112)
Creatinine, Ser: 0.86 mg/dL (ref 0.50–1.10)
GFR calc Af Amer: 80 mL/min — ABNORMAL LOW (ref >90)
GFR calc non Af Amer: 69 mL/min — ABNORMAL LOW (ref >90)
Glucose, Bld: 163 mg/dL — ABNORMAL HIGH (ref 70–99)
Potassium: 4.6 mmol/L (ref 3.5–5.1)
Sodium: 136 mmol/L (ref 135–145)

## 2014-01-23 LAB — CBC
HCT: 46.2 % — ABNORMAL HIGH (ref 36.0–46.0)
Hemoglobin: 15.4 g/dL — ABNORMAL HIGH (ref 12.0–15.0)
MCH: 29.9 pg (ref 26.0–34.0)
MCHC: 33.3 g/dL (ref 30.0–36.0)
MCV: 89.7 fL (ref 78.0–100.0)
PLATELETS: 384 10*3/uL (ref 150–400)
RBC: 5.15 MIL/uL — ABNORMAL HIGH (ref 3.87–5.11)
RDW: 13.9 % (ref 11.5–15.5)
WBC: 8.3 10*3/uL (ref 4.0–10.5)

## 2014-01-23 LAB — DIGOXIN LEVEL: DIGOXIN LVL: 0.4 ng/mL — AB (ref 0.8–2.0)

## 2014-01-23 LAB — TSH: TSH: 0.548 u[IU]/mL (ref 0.350–4.500)

## 2014-01-23 LAB — I-STAT TROPONIN, ED: TROPONIN I, POC: 0 ng/mL (ref 0.00–0.08)

## 2014-01-23 LAB — MRSA PCR SCREENING: MRSA by PCR: NEGATIVE

## 2014-01-23 LAB — BRAIN NATRIURETIC PEPTIDE: B Natriuretic Peptide: 115.8 pg/mL — ABNORMAL HIGH (ref 0.0–100.0)

## 2014-01-23 MED ORDER — SODIUM CHLORIDE 0.9 % IJ SOLN
3.0000 mL | Freq: Two times a day (BID) | INTRAMUSCULAR | Status: DC
Start: 2014-01-23 — End: 2014-01-24
  Administered 2014-01-23: 3 mL via INTRAVENOUS

## 2014-01-23 MED ORDER — ALBUTEROL SULFATE HFA 108 (90 BASE) MCG/ACT IN AERS: 2.0000 | INHALATION_SPRAY | Freq: Four times a day (QID) | RESPIRATORY_TRACT | Status: DC | PRN

## 2014-01-23 MED ORDER — HYDROCODONE-HOMATROPINE 5-1.5 MG/5ML PO SYRP: 5.0000 mL | ORAL_SOLUTION | Freq: Four times a day (QID) | ORAL | Status: DC | PRN

## 2014-01-23 MED ORDER — NITROGLYCERIN 0.4 MG SL SUBL: 0.4000 mg | SUBLINGUAL_TABLET | SUBLINGUAL | Status: DC | PRN

## 2014-01-23 MED ORDER — AMIODARONE LOAD VIA INFUSION
150.0000 mg | Freq: Once | INTRAVENOUS | Status: AC
  Administered 2014-01-23: 150 mg via INTRAVENOUS
  Filled 2014-01-23: qty 83.34

## 2014-01-23 MED ORDER — ACETAMINOPHEN 325 MG PO TABS: 650.0000 mg | ORAL_TABLET | ORAL | Status: DC | PRN

## 2014-01-23 MED ORDER — LISINOPRIL 5 MG PO TABS
5.0000 mg | ORAL_TABLET | Freq: Every day | ORAL | Status: DC
  Administered 2014-01-24: 5 mg via ORAL
  Filled 2014-01-23: qty 1

## 2014-01-23 MED ORDER — ATORVASTATIN CALCIUM 40 MG PO TABS
40.0000 mg | ORAL_TABLET | Freq: Every day | ORAL | Status: DC
  Filled 2014-01-23: qty 1

## 2014-01-23 MED ORDER — METOPROLOL TARTRATE 12.5 MG HALF TABLET
12.5000 mg | ORAL_TABLET | Freq: Two times a day (BID) | ORAL | Status: DC
  Filled 2014-01-23: qty 1

## 2014-01-23 MED ORDER — ALPRAZOLAM 0.25 MG PO TABS: 0.2500 mg | ORAL_TABLET | Freq: Two times a day (BID) | ORAL | Status: DC | PRN

## 2014-01-23 MED ORDER — BUDESONIDE-FORMOTEROL FUMARATE 160-4.5 MCG/ACT IN AERO
2.0000 | INHALATION_SPRAY | Freq: Two times a day (BID) | RESPIRATORY_TRACT | Status: DC
  Administered 2014-01-23: 2 via RESPIRATORY_TRACT
  Filled 2014-01-23: qty 6

## 2014-01-23 MED ORDER — ALBUTEROL SULFATE (2.5 MG/3ML) 0.083% IN NEBU: 2.5000 mg | INHALATION_SOLUTION | Freq: Four times a day (QID) | RESPIRATORY_TRACT | Status: DC | PRN

## 2014-01-23 MED ORDER — HEPARIN SODIUM (PORCINE) 5000 UNIT/ML IJ SOLN
5000.0000 [IU] | Freq: Three times a day (TID) | INTRAMUSCULAR | Status: DC
  Filled 2014-01-23 (×3): qty 1

## 2014-01-23 MED ORDER — SODIUM CHLORIDE 0.9 % IV SOLN: 250.0000 mL | INTRAVENOUS | Status: DC | PRN

## 2014-01-23 MED ORDER — METOPROLOL TARTRATE 25 MG PO TABS
25.0000 mg | ORAL_TABLET | Freq: Two times a day (BID) | ORAL | Status: DC
  Administered 2014-01-23 – 2014-01-24 (×2): 25 mg via ORAL
  Filled 2014-01-23 (×4): qty 1

## 2014-01-23 MED ORDER — SODIUM CHLORIDE 0.9 % IJ SOLN: 3.0000 mL | INTRAMUSCULAR | Status: DC | PRN

## 2014-01-23 MED ORDER — ONDANSETRON HCL 4 MG/2ML IJ SOLN: 4.0000 mg | Freq: Four times a day (QID) | INTRAMUSCULAR | Status: DC | PRN

## 2014-01-23 MED ORDER — PANTOPRAZOLE SODIUM 40 MG PO TBEC
40.0000 mg | DELAYED_RELEASE_TABLET | Freq: Every day | ORAL | Status: DC
  Administered 2014-01-24: 40 mg via ORAL
  Filled 2014-01-23: qty 1

## 2014-01-23 MED ORDER — AMIODARONE HCL IN DEXTROSE 360-4.14 MG/200ML-% IV SOLN
30.0000 mg/h | INTRAVENOUS | Status: DC
  Filled 2014-01-23 (×2): qty 200

## 2014-01-23 MED ORDER — AMIODARONE HCL IN DEXTROSE 360-4.14 MG/200ML-% IV SOLN
60.0000 mg/h | INTRAVENOUS | Status: DC
  Administered 2014-01-23 (×2): 60 mg/h via INTRAVENOUS
  Filled 2014-01-23 (×3): qty 200

## 2014-01-23 MED ORDER — ZOLPIDEM TARTRATE 5 MG PO TABS: 5.0000 mg | ORAL_TABLET | Freq: Every evening | ORAL | Status: DC | PRN

## 2014-01-23 MED ORDER — ENSURE COMPLETE PO LIQD: 237.0000 mL | Freq: Two times a day (BID) | ORAL | Status: DC

## 2014-01-23 MED ORDER — CYCLOBENZAPRINE HCL 10 MG PO TABS: 10.0000 mg | ORAL_TABLET | Freq: Every evening | ORAL | Status: DC | PRN

## 2014-01-23 MED ORDER — ASPIRIN EC 81 MG PO TBEC
81.0000 mg | DELAYED_RELEASE_TABLET | Freq: Every day | ORAL | Status: DC
  Administered 2014-01-24: 81 mg via ORAL
  Filled 2014-01-23: qty 1

## 2014-01-23 NOTE — ED Provider Notes (Signed)
CSN: [id number]     Arrival date & time 01/23/14  1211 History   First MD Initiated Contact with Patient 01/23/14 1231     Chief Complaint  Patient presents with  . Loss of Consciousness     (Consider location/radiation/quality/duration/timing/severity/associated sxs/prior Treatment) HPI Comments: Wearing a heart monitor - called to come into the ED. Her dizzy spells correlate with episodes of wide complex tachycardia.  Patient is a 69 y.o. female presenting with syncope. The history is provided by the patient.  Loss of Consciousness Episode history:  Multiple Most recent episode:  Yesterday Timing:  Sporadic Progression:  Worsening Chronicity:  New Context comment:  Spontaneously Witnessed: yes   Relieved by:  Nothing Worsened by:  Nothing tried Associated symptoms: no fever, no shortness of breath and no vomiting     Past Medical History  Diagnosis Date  . VSD (ventricular septal defect)   . PVOD (pulmonary veno-occlusive disease)   . COPD (chronic obstructive pulmonary disease)   . Dyslipidemia   . Anxiety   . GERD (gastroesophageal reflux disease)   . Pericarditis   . Thoracic outlet syndrome   . HTN (hypertension) 02/22/2011  . Asthma   . Blood in stool   . Ulcer   . Migraines   . Migraine 02/26/2011   Past Surgical History  Procedure Laterality Date  . Vsd repair    . Tubal ligation    . Cholecystectomy    . Cesarean section     Family History  Problem Relation Age of Onset  . Throat cancer    . Hypertension    . Stroke    . Alcohol abuse Other   . Arthritis Other   . Cancer Other     lung cancer  . Hypertension Other   . Arthritis Other   . Stroke Other    History  Substance Use Topics  . Smoking status: Current Every Day Smoker -- 0.30 packs/day  . Smokeless tobacco: Not on file     Comment: Smoked 1971-present , up to 1/2 ppd. Intermittent smoking cessation with preganancy up to 1/2-1 year @ a time  . Alcohol Use: No   OB History    No data  available     Review of Systems  Constitutional: Negative for fever.  Respiratory: Negative for cough and shortness of breath.   Cardiovascular: Positive for syncope.  Gastrointestinal: Negative for vomiting and abdominal pain.  All other systems reviewed and are negative.     Allergies  Codeine  Home Medications   Prior to Admission medications   Medication Sig Start Date End Date Taking? Authorizing Provider  albuterol (VENTOLIN HFA) 108 (90 BASE) MCG/ACT inhaler Inhale 2 puffs into the lungs every 6 (six) hours as needed.      Historical Provider, MD  ALPRAZolam Duanne Moron) 0.25 MG tablet TAKE 1/2 TO 1 TABLET BY MOUTH TWICE DAILY AS NEEDED FOR STRESS 12/30/13   Biagio Borg, MD  aspirin 81 MG tablet Take 81 mg by mouth daily.      Historical Provider, MD  budesonide-formoterol (SYMBICORT) 160-4.5 MCG/ACT inhaler Inhale 2 puffs into the lungs 2 (two) times daily. 01/17/14   Biagio Borg, MD  cyclobenzaprine (FLEXERIL) 10 MG tablet Take 1 tablet (10 mg total) by mouth at bedtime as needed for muscle spasms. 12/14/12   Biagio Borg, MD  digoxin (LANOXIN) 0.125 MG tablet TAKE 1 TABLET EVERY DAY 07/19/13   Fay Records, MD  furosemide (LASIX) 20 MG tablet Take  1 tablet daily as needed 04/20/13   Fay Records, MD  HYDROcodone-homatropine [facility]) 5-1.5 MG/5ML syrup Take 5 mLs by mouth every 6 (six) hours as needed for cough. 01/17/14   Biagio Borg, MD  levofloxacin (LEVAQUIN) 250 MG tablet Take 1 tablet (250 mg total) by mouth daily. 01/17/14   Biagio Borg, MD  lisinopril (PRINIVIL,ZESTRIL) 5 MG tablet TAKE 1 TABLET EVERY DAY 07/19/13   Fay Records, MD  Omeprazole (PRILOSEC PO) Take 20 mg by mouth as needed. 11/21/13   Historical Provider, MD  predniSONE (DELTASONE) 10 MG tablet 3 tabs by mouth per day for 3 days,2abs per day for 3 days,1tab per day for 3 days 01/17/14   Biagio Borg, MD  simvastatin (ZOCOR) 40 MG tablet TAKE 1 TABLET EVERY DAY 07/19/13   Fay Records, MD   BP 126/59 mmHg   Pulse 73  Temp(Src) 98.3 F (36.8 C) (Oral)  Resp 18  SpO2 99% Physical Exam  Constitutional: She is oriented to person, place, and time. She appears well-developed and well-nourished. No distress.  HENT:  Head: Normocephalic and atraumatic.  Mouth/Throat: Oropharynx is clear and moist.  Eyes: EOM are normal. Pupils are equal, round, and reactive to light.  Neck: Normal range of motion. Neck supple.  Cardiovascular: Normal rate and regular rhythm.  Exam reveals no friction rub.   No murmur heard. Pulmonary/Chest: Effort normal and breath sounds normal. No respiratory distress. She has no wheezes. She has no rales.  Abdominal: Soft. She exhibits no distension. There is no tenderness. There is no rebound.  Musculoskeletal: Normal range of motion. She exhibits no edema.  Neurological: She is alert and oriented to person, place, and time.  Skin: No rash noted. She is not diaphoretic.  Nursing note and vitals reviewed.   ED Course  Procedures (including critical care time) Labs Review Labs Reviewed  CBC  BASIC METABOLIC PANEL  BRAIN NATRIURETIC PEPTIDE  DIGOXIN LEVEL  TSH  I-STAT [place], ED    Imaging Review No results found.   EKG Interpretation   Date/Time:  Monday January 23 2014 12:27:31 EST Ventricular Rate:  75 PR Interval:  138 QRS Duration: 152 QT Interval:  408 QTC Calculation: 455 R Axis:   98 Text Interpretation:  Unusual P axis and short PR, probable junctional  rhythm with frequent Premature ventricular complexes Right bundle branch  block Abnormal ECG No prior for comparison Confirmed by Mingo Amber  MD, Delmer Kowalski  ([id number]) on 01/23/2014 12:45:26 PM     CRITICAL CARE Performed by: Osvaldo Shipper   Total critical care time: 30 minutes  Critical care time was exclusive of separately billable procedures and treating other patients.  Critical care was necessary to treat or prevent imminent or life-threatening deterioration.  Critical care was time  spent personally by me on the following activities: development of treatment plan with patient and/or surrogate as well as nursing, discussions with consultants, evaluation of patient's response to treatment, examination of patient, obtaining history from patient or surrogate, ordering and performing treatments and interventions, ordering and review of laboratory studies, ordering and review of radiographic studies, pulse oximetry and re-evaluation of patient's condition.  MDM   Final diagnoses:  Ventricular tachycardia    63F here after being called by her Cardiology office - is wearing a heart monitor for syncope/dizziness. Per note in the computer, is having episodes of wide complex tachycardia concerning for VT. Here relaxing comfortably, states similar chronic CP, no SOB, no N/V/D. She had  6 episodes of dizziness and 2 syncopal events yesterday, 2 dizziness episodes with 1 syncopal event today. EKG here with multiple PVCs, similar morphology to prior EKGs noted in the computer.  Labs checked - TSH included b/c she reports 17 lb weight loss in past month without trying to lose weight and has a thyroid US scheduled in 2 days. Amiodarone bolus and gtt initiated. Cardiology consulted and will admit.     Evelina Bucy, MD 01/23/14 [phone]

## 2014-01-23 NOTE — Telephone Encounter (Signed)
Obtained monitor report from [place], shared with DOD Dr. Meda Coffee, stated "pt having wide complex tachycardia with ventricular rate of 271/ BPM; widely suspicious of VT.  Also symptomatic with recurrent pre-syncope. Advise to go to the ER".   Spoke with pt, she stated "had another episode this morning' and a light episode while on the phone.  Recommended she go to the emergency room immediately or call EMS, not to drive, pt husband is with her.  She stated she was not going to call EMS and her husband would take her.

## 2014-01-23 NOTE — ED Notes (Signed)
Cardiology at bedside.

## 2014-01-23 NOTE — Consult Note (Signed)
ELECTROPHYSIOLOGY CONSULT NOTE   Patient ID: Kimberly [name] MRN: [id number] DOB/AGE: 67-Jun-1947 67 y.o.  Admit date: 01/23/2014  Primary Physician   Cathlean Cower, MD Primary Cardiologist   Dr. Harrington Challenger Reason for Consultation   Near-Syncope  Kimberly [name] is a 69 y.o. female with a history of congenital heart disease (s/p VSD repair x 2), nojn obstructive CAD in 2000, dyslipidemia, COPD and tobacco use. She was seen by Dr. Harrington Challenger in 12/27/2013 for syncope and fall. There were several episodes (not as severe) where she did not pass out. An event monitor was ordered and placed on 01/04/2014.   She presents today with continuing problems of episodes of feeling faint and dizzy. She describes the episodes as feeling like the room is spinning, her body feels like it is spinning, then her face goes numb and she feels weak. Usually, she is able to hold herself up until the feeling passes (less than 30 seconds), but 1 month ago she had 1 episode where she "hit the floor". The latest episode was yesterday, which she described as a "10/10" due to the fact that it lasted about 50-60 seconds, but she did not completely lose consciousness. Most of the episodes are around 10 seconds in length.   Pt had triggered several recordings on the event monitor, and says that she was called this morning and told to come to the ED. She currently has no pain or complaints since admission. Her general health has been at baseline, but her pulmonary status is poor.    Past Medical History  Diagnosis Date  . VSD (ventricular septal defect)   . PVOD (pulmonary veno-occlusive disease)   . COPD (chronic obstructive pulmonary disease)   . Dyslipidemia   . Anxiety   . GERD (gastroesophageal reflux disease)   . Pericarditis   . Thoracic outlet syndrome   . HTN (hypertension) 02/22/2011  . Asthma   . Blood in stool   . Ulcer   . Migraines   . Migraine 02/26/2011     Past Surgical History  Procedure Laterality Date  .  Vsd repair    . Tubal ligation    . Cholecystectomy    . Cesarean section    . Coronary angiogram  2000    No significant CAD    Allergies  Allergen Reactions  . Codeine     Nausea    I have reviewed the patient's current medications . [START ON 01/24/2014] aspirin EC  81 mg Oral Daily  . [START ON 01/24/2014] atorvastatin  40 mg Oral q1800  . budesonide-formoterol  2 puff Inhalation BID  . heparin  5,000 Units Subcutaneous 3 times per day  . [START ON 01/24/2014] lisinopril  5 mg Oral Daily  . metoprolol tartrate  12.5 mg Oral BID  . [START ON 01/24/2014] pantoprazole  40 mg Oral Daily  . sodium chloride  3 mL Intravenous Q12H   . amiodarone 60 mg/hr (01/23/14 1705)   Followed by  . amiodarone Stopped (01/23/14 1358)   sodium chloride, acetaminophen, albuterol, ALPRAZolam, cyclobenzaprine, HYDROcodone-homatropine, nitroGLYCERIN, ondansetron (ZOFRAN) IV, sodium chloride, zolpidem Medication Sig  albuterol (VENTOLIN HFA) 108 (90 BASE) MCG/ACT inhaler Inhale 2 puffs into the lungs every 6 (six) hours as needed.    ALPRAZolam (XANAX) 0.25 MG tablet TAKE 1/2 TO 1 TABLET BY MOUTH TWICE DAILY AS NEEDED FOR STRESS  aspirin 81 MG tablet Take 81 mg by mouth daily.    budesonide-formoterol (SYMBICORT) 160-4.5 MCG/ACT inhaler Inhale  2 puffs into the lungs 2 (two) times daily.  cyclobenzaprine (FLEXERIL) 10 MG tablet Take 1 tablet (10 mg total) by mouth at bedtime as needed for muscle spasms.  digoxin (LANOXIN) 0.125 MG tablet TAKE 1 TABLET EVERY DAY  furosemide (LASIX) 20 MG tablet Take 1 tablet daily as needed  HYDROcodone-homatropine (HYCODAN) 5-1.5 MG/5ML syrup Take 5 mLs by mouth every 6 (six) hours as needed for cough.  levofloxacin (LEVAQUIN) 250 MG tablet Take 1 tablet (250 mg total) by mouth daily.  lisinopril (PRINIVIL,ZESTRIL) 5 MG tablet TAKE 1 TABLET EVERY DAY  Omeprazole (PRILOSEC PO) Take 20 mg by mouth as needed.  predniSONE (DELTASONE) 10 MG tablet 3 tabs by mouth per day for 3  days,2abs per day for 3 days,1tab per day for 3 days  simvastatin (ZOCOR) 40 MG tablet TAKE 1 TABLET EVERY DAY     History   Social History  . Marital Status: Married    Spouse Name: N/A    Number of Children: N/A  . Years of Education: 12   Occupational History  . retired    Social History Main Topics  . Smoking status: Current Every Day Smoker -- 0.30 packs/day  . Smokeless tobacco: Not on file     Comment: Smoked 1971-present , up to 1/2 ppd. Intermittent smoking cessation with preganancy up to 1/2-1 year @ a time  . Alcohol Use: No  . Drug Use: No  . Sexual Activity: Not on file   Other Topics Concern  . Not on file   Social History Narrative   Pt lives with husband.    Family Status  Relation Status Death Age  . Sister Alive     half sister  . Mother Deceased     Cancer  . Father Deceased     Cancer   Family History  Problem Relation Age of Onset  . Throat cancer    . Hypertension    . Stroke    . Alcohol abuse Other   . Arthritis Other   . Cancer Other     lung cancer  . Hypertension Other   . Arthritis Other   . Stroke Other      ROS:  Full 14 point review of systems complete and found to be negative unless listed above.  Physical Exam: Blood pressure 127/56, pulse 67, temperature 98.3 F (36.8 C), temperature source Oral, resp. rate 19, SpO2 97 %.  General: Well developed, well nourished, female in no acute distress Head: Eyes PERRLA, No xanthomas.   Normocephalic and atraumatic, oropharynx without edema or exudate. Dentition: poor  Lungs: rales bases, slight exp wheeze Heart: HRRR S1 S2, no rub/gallop, 3/6 murmur. pulses are 2+ all 4 extrem.   Neck: No carotid bruits. No lymphadenopathy.  JVD minimal elevation. Abdomen: Bowel sounds present, abdomen soft and non-tender without masses or hernias noted. Msk:  No spine or cva tenderness. No weakness, no joint deformities or effusions. Extremities: No clubbing or cyanosis. no edema.  Neuro: Alert  and oriented X 3. No focal deficits noted. Psych:  Good affect, responds appropriately Skin: No rashes or lesions noted.  Labs:   Lab Results  Component Value Date   WBC 8.3 01/23/2014   HGB 15.4* 01/23/2014   HCT 46.2* 01/23/2014   MCV 89.7 01/23/2014   PLT 384 01/23/2014    Recent Labs Lab 01/23/14 1300  NA 136  K 4.6  CL 101  CO2 25  BUN 16  CREATININE 0.86  CALCIUM 9.2  GLUCOSE 163*    Recent Labs  01/23/14 1308  TROPIPOC 0.00   TSH  Date/Time Value Ref Range Status  03/16/2013 10:57 AM 1.28 0.35 - 5.50 uIU/mL Final   Echo: 01/04/2014 - Left ventricle: The cavity size was normal. Wall thickness was normal. Systolic function was mildly to moderately reduced. The estimated ejection fraction was in the range of 40% to 45%. There is akinesis of the anteroseptal myocardium. Doppler parameters are consistent with abnormal left ventricular relaxation (grade 1 diastolic dysfunction). - Mitral valve: Calcified annulus. Mildly thickened, mildly calcified leaflets . There was mild regurgitation. - Tricuspid valve: There was mild regurgitation directed eccentrically. - Pulmonary arteries: Systolic pressure was mildly increased. PA peak pressure: 31 mm Hg (S). Impressions: - Compared to the prior study, there has been no significant interval change. No VSD visualized.  ECG:  01/23/2014 SR, PVCs Vent. rate 75 BPM PR interval 138 ms QRS duration 152 ms QT/QTc 408/455 ms P-R-T axes 268 98 109  Radiology:  Dg Chest Portable 1 View 01/23/2014   CLINICAL DATA:  Syncope over the past 2 days. Numbness and tingling diffusely.  EXAM: PORTABLE CHEST - 1 VIEW  COMPARISON:  PA and lateral chest 01/17/2014 in 06/29/2013.  FINDINGS: The lungs are clear. Heart size is upper normal. No pneumothorax or pleural effusion. The patient is status post median sternotomy.  IMPRESSION: No acute disease.   Electronically Signed   By: Inge Rise M.D.   On: 01/23/2014  13:37    ASSESSMENT AND PLAN:   The patient was seen today by Dr. Caryl Comes, the patient evaluated and the data reviewed.  Principal Problem:   Syncope and collapse - Strips from the office are pending, conflicting info on SVT vs VT, MD to review. Further evaluation depending on this.  Otherwise, per IM/Cards Active Problems:   Dyslipidemia   TOBACCO ABUSE   COPD (chronic obstructive pulmonary disease)   VSD- s/p repair x 2   HTN (hypertension)   Cardiomyopathy-EF 40-45% Dec 2015   VT (ventricular tachycardia) reported on Holter Monitor   Signed: Virl Axe, MD 01/23/2014 5:11 PM Beeper [phone]  Co-Sign MD 69 yo female with copd and hx of VSD repair X2 presenting with recurrent episodes of presyncope assoc with vertigo.  She was given an event recorder and activated the spells in the last 48 hours both of which demonstrated a brief 8-10 second run of wide complex tachycardia most consistent with atrial tachycardia. She also has frequent PVCs with the RVOT morphology and a transition in lead V3  Treatment strategies would include calcium and/or beta blockers, and antiarrhythmic drugs. The modest LV dysfunction options could include dronaderone, dofetilide, amiodarone and with some reluctance sotalol.  In the past she tolerated Inderal which she does not recall how long ago that was. We will start her on a beta selective drug i.e. metoprolol and we'll stop her digoxin given her near normal LV function and recent data on increased mortality  She had a recent echo within the last month. We will skip catheterization.  There is a great deal of ongoing psychosocial stress which she and her son feel may be contributing. She will try to avoid it. Is related to another son and her grandson.

## 2014-01-23 NOTE — ED Notes (Signed)
Pt reports having cardiac hx, had syncopal episodes yesterday and today. Only complaint at this time, is that pt feels "jittery." denies cp or sob. Pt is wearing heart monitor and was told to come in due to syncope and irregularities caught on monitor.

## 2014-01-23 NOTE — Telephone Encounter (Signed)
New message      Pt is wearing a monitor.  She had 6 episodes yesterday and "blacked out" briefly twice.  She is having a thyroid test Wednesday.  Please call

## 2014-01-23 NOTE — H&P (Signed)
Patient ID: Kimberly [name] MRN: [id number], DOB/AGE: [birth year]   Admit date: 01/23/2014   Primary Physician: Cathlean Cower, MD Primary Cardiologist: Dr Harrington Challenger  HPI: Patient is a 69 year old with a history of congenital heart disease (s/p VSD repair x 2), nojn obstructive CAD in 2000, dyslipidemia, COPD and tobacco use. She is followed by Dr Harrington Challenger. LOV in Dec 2015. She had described episodic "shaking"and near syncope. Holter monitor and echo were ordered. Echo showqed LV and RV function were stable. LVEF was 40-45%% with akinesis of the anteroseptal, RV was normal. The pt says she has been having "spells" for 3 months. She feels like she is "going out" and collapses. She has not suffered any injuries. She may have an episode several times a day, and then not for weeks. Yesterday she reported 4 near suyncopal spells ans two syncopal spells where she was "helped to the floor". She denies any palpitaztions or diaphoresis. No seizure activity reported by family. She had another episode this am while at the store. Monitor strip reviewed in the office and showed- "pt having wide complex tachycardia with ventricular rate of 271/ BPM; widely suspicious of VT. Also symptomatic with recurrent pre-syncope. Advise to go to the ER".  Since admission top the ER she has had PVCs but no VT.    Problem List: Past Medical History  Diagnosis Date  . VSD (ventricular septal defect)   . PVOD (pulmonary veno-occlusive disease)   . COPD (chronic obstructive pulmonary disease)   . Dyslipidemia   . Anxiety   . GERD (gastroesophageal reflux disease)   . Pericarditis   . Thoracic outlet syndrome   . HTN (hypertension) 02/22/2011  . Asthma   . Blood in stool   . Ulcer   . Migraines   . Migraine 02/26/2011    Past Surgical History  Procedure Laterality Date  . Vsd repair    . Tubal ligation    . Cholecystectomy    . Cesarean section    . Coronary angiogram  2000    No significant CAD     Allergies:    Allergies  Allergen Reactions  . Codeine     Nausea      Home Medications Current Facility-Administered Medications  Medication Dose Route Frequency Provider Last Rate Last Dose  . amiodarone (NEXTERONE PREMIX) 360 MG/200ML (1.8 mg/mL) IV infusion  60 mg/hr Intravenous Continuous Evelina Bucy, MD       Followed by  . amiodarone (NEXTERONE PREMIX) 360 MG/200ML (1.8 mg/mL) IV infusion  30 mg/hr Intravenous Continuous Evelina Bucy, MD   Stopped at 01/23/14 1358   Current Outpatient Prescriptions  Medication Sig Dispense Refill  . albuterol (VENTOLIN HFA) 108 (90 BASE) MCG/ACT inhaler Inhale 2 puffs into the lungs every 6 (six) hours as needed.      . ALPRAZolam (XANAX) 0.25 MG tablet TAKE 1/2 TO 1 TABLET BY MOUTH TWICE DAILY AS NEEDED FOR STRESS 60 tablet 1  . aspirin 81 MG tablet Take 81 mg by mouth daily.      . budesonide-formoterol (SYMBICORT) 160-4.5 MCG/ACT inhaler Inhale 2 puffs into the lungs 2 (two) times daily. 1 Inhaler 11  . cyclobenzaprine (FLEXERIL) 10 MG tablet Take 1 tablet (10 mg total) by mouth at bedtime as needed for muscle spasms. 30 tablet 5  . digoxin (LANOXIN) 0.125 MG tablet TAKE 1 TABLET EVERY DAY 90 tablet 1  . furosemide (LASIX) 20 MG tablet Take 1 tablet daily as needed 90 tablet 1  .  HYDROcodone-homatropine (HYCODAN) 5-1.5 MG/5ML syrup Take 5 mLs by mouth every 6 (six) hours as needed for cough. 180 mL 0  . levofloxacin (LEVAQUIN) 250 MG tablet Take 1 tablet (250 mg total) by mouth daily. 10 tablet 0  . lisinopril (PRINIVIL,ZESTRIL) 5 MG tablet TAKE 1 TABLET EVERY DAY 90 tablet 1  . Omeprazole (PRILOSEC PO) Take 20 mg by mouth as needed.    . predniSONE (DELTASONE) 10 MG tablet 3 tabs by mouth per day for 3 days,2abs per day for 3 days,1tab per day for 3 days 18 tablet 0  . simvastatin (ZOCOR) 40 MG tablet TAKE 1 TABLET EVERY DAY 90 tablet 1     Family History  Problem Relation Age of Onset  . Throat cancer    . Hypertension    . Stroke    . Alcohol  abuse Other   . Arthritis Other   . Cancer Other     lung cancer  . Hypertension Other   . Arthritis Other   . Stroke Other      History   Social History  . Marital Status: Married    Spouse Name: N/A    Number of Children: N/A  . Years of Education: 12   Occupational History  . retired    Social History Main Topics  . Smoking status: Current Every Day Smoker -- 0.30 packs/day  . Smokeless tobacco: Not on file     Comment: Smoked 1971-present , up to 1/2 ppd. Intermittent smoking cessation with preganancy up to 1/2-1 year @ a time  . Alcohol Use: No  . Drug Use: No  . Sexual Activity: Not on file   Other Topics Concern  . Not on file   Social History Narrative     Review of Systems: General: negative for chills, fever, night sweats or weight changes.  Cardiovascular: negative for chest pain, dyspnea on exertion, edema, orthopnea, palpitations, paroxysmal nocturnal dyspnea or shortness of breath Dermatological: negative for rash Respiratory: negative for cough or wheezing Urologic: negative for hematuria Abdominal: negative for nausea, vomiting, diarrhea, bright red blood per rectum, melena, or hematemesis Neurologic: negative for visual changes, syncope, or dizziness All other systems reviewed and are otherwise negative except as noted above.  Physical Exam: Blood pressure 117/95, pulse 67, temperature 98.3 F (36.8 C), temperature source Oral, resp. rate 27, SpO2 98 %.  General appearance: alert, cooperative and no distress Neck: no carotid bruit and no JVD Lungs: decreased breath sounds, no wheezing Heart: regular rate and rhythm and 2/6 systolic murmur Abdomen: soft, non-tender; bowel sounds normal; no masses,  no organomegaly Extremities: extremities normal, atraumatic, no cyanosis or edema Pulses: diminnished Skin: cool and dry Neurologic: Grossly normal Recent enlarged goiter noted on carotid US. TSH WNL, thyroid US pending.   Labs:   Results for  orders placed or performed during the hospital encounter of 01/23/14 (from the past 24 hour(s))  CBC     Status: Abnormal   Collection Time: 01/23/14  1:00 PM  Result Value Ref Range   WBC 8.3 4.0 - 10.5 K/uL   RBC 5.15 (H) 3.87 - 5.11 MIL/uL   Hemoglobin 15.4 (H) 12.0 - 15.0 g/dL   HCT 46.2 (H) 36.0 - 46.0 %   MCV 89.7 78.0 - 100.0 fL   MCH 29.9 26.0 - 34.0 pg   MCHC 33.3 30.0 - 36.0 g/dL   RDW 13.9 11.5 - 15.5 %   Platelets 384 150 - 400 K/uL  I-stat troponin, ED (not at  [facility])     Status: None   Collection Time: 01/23/14  1:08 PM  Result Value Ref Range   Troponin i, poc 0.00 0.00 - 0.08 ng/mL   Comment 3             Radiology/Studies:  Dg Chest Portable 1 View  01/23/2014   CLINICAL DATA:  Syncope over the past 2 days. Numbness and tingling diffusely.  EXAM: PORTABLE CHEST - 1 VIEW  COMPARISON:  PA and lateral chest 01/17/2014 in 06/29/2013.  FINDINGS: The lungs are clear. Heart size is upper normal. No pneumothorax or pleural effusion. The patient is status post median sternotomy.  IMPRESSION: No acute disease.   Electronically Signed   By: Inge Rise M.D.   On: 01/23/2014 13:37    EKG:NSR short PR, PVCs, RBBB  ASSESSMENT AND PLAN:  Principal Problem:   Syncope and collapse Active Problems:   VT (ventricular tachycardia) reported on Holter Monitor   COPD (chronic obstructive pulmonary disease)   HTN (hypertension)   Cardiomyopathy-EF 40-45% Dec 2015   Dyslipidemia   TOBACCO ABUSE   VSD- s/p repair x 2   PLAN: Not on a beta blocker secondary to COPD. Amiodarone started in ER. Stop Lanoxin. Reviewed with Dr Caryl Comes who will see pt today. Plan on Rt and Lt cath tomorrow.    Henri Medal, PA-C 01/23/2014, 2:00 PM   Patient examined chart reviewed.  Worrisome substrate for VT leading to palpitations and presyncope.  MRI 2010 with significant scar associated with VSD repair. EF known to be abnormal for years.  Event monitor with runs of NSVT Will arrange cath  tomorrow to r/o CAD and discuss with Dr Caryl Comes AICD placement.  Exam with what sounds like a residual VSD murmur Consider right and left heart cath with sat run.  Recent echo with no comment on VSD flow  She has significant COPD and still smokes Counseled on this.  Had nausea with Chantix but may be best to d/c with welbutrin  Would not use nicotine replacement until CAD r/o and with NSVT  Jenkins Rouge

## 2014-01-24 ENCOUNTER — Encounter (HOSPITAL_COMMUNITY)
Admission: EM | Disposition: A | Discharge status: Home / Self Care | Payer: Self-pay | Source: Home / Self Care | Attending: Cardiovascular Disease

## 2014-01-24 SURGERY — LEFT AND RIGHT HEART CATHETERIZATION WITH CORONARY ANGIOGRAM

## 2014-01-24 MED ORDER — SODIUM CHLORIDE 0.9 % IV SOLN: 1.0000 mL/kg/h | INTRAVENOUS | Status: DC

## 2014-01-24 MED ORDER — METOPROLOL TARTRATE 25 MG PO TABS: 12.5000 mg | ORAL_TABLET | Freq: Two times a day (BID) | ORAL | Status: DC

## 2014-01-24 MED ORDER — SODIUM CHLORIDE 0.9 % IV SOLN: 250.0000 mL | INTRAVENOUS | Status: DC | PRN

## 2014-01-24 MED ORDER — SODIUM CHLORIDE 0.9 % IJ SOLN: 3.0000 mL | Freq: Two times a day (BID) | INTRAMUSCULAR | Status: DC

## 2014-01-24 MED ORDER — METOPROLOL TARTRATE 25 MG PO TABS: 25.0000 mg | ORAL_TABLET | Freq: Two times a day (BID) | ORAL | Status: DC

## 2014-01-24 MED ORDER — SODIUM CHLORIDE 0.9 % IJ SOLN: 3.0000 mL | INTRAMUSCULAR | Status: DC | PRN

## 2014-01-24 NOTE — Discharge Summary (Signed)
Physician Discharge Summary  Patient ID: Kimberly [name] MRN: [id number] DOB/AGE: 67-12-1945 67 y.o.   Primary Cardiologist: Dr. Harrington Challenger  Admit date: 01/23/2014 Discharge date: 01/24/2014  Admission Diagnoses: Near Syncope w/ WCT  Discharge Diagnoses:  Principal Problem:   Syncope and collapse Active Problems:   Dyslipidemia   TOBACCO ABUSE   COPD (chronic obstructive pulmonary disease)   VSD- s/p repair x 2   HTN (hypertension)   Cardiomyopathy-EF 40-45% Dec 2015   VT (ventricular tachycardia) reported on Holter Monitor   Discharged Condition: stable  Hospital Course: The patient is a 69 y/o female with a h/o congenital heart disease (s/p VSD repair x 2), nojn obstructive CAD in 2000, dyslipidemia, COPD and tobacco use. She was seen by Dr. Harrington Challenger in 12/27/2013 for syncope and a fall. There were several episodes (not as severe) where she did not pass out. An event monitor was ordered and placed on 01/04/2014. Since that time, the patient had triggered several recordings on the event monitor. On 01/23/14 the monitored captured wide complex tachycardia with ventricular rate of 271 bpm suspicious of VT. The patient was also symptomatic during this time with recurrent syncope. The patient was contacted and advised to go to the ER. On arrival, she had no recurrent arrhythmias.  She was evaluated by Dr. Caryl Comes. Review of monitor recordings demonstrated several brief 8-10 second runs of wide complex tachycardia most consistent with atrial tachycardia. She was also noted to have frequent PVCs with RVOT morphology and transition in lead V3. Dr. Caryl Comes recommended treatment with BB therapy. She was initially started on 25 mg of Lopressor BID. It was also recommended that her digoxin be discontinued given her normal LV function and recent data on increased mortality. She was keep overnight for observation. She had no recurrent tachy arrhthymias, however she did develop sinus bradycardia with rates in the 40s-50s on  25 mg of Lopressor. However, she remained completely asymptomatic. It was decided to reduce her dose down to 12.5 mg BID. She was last seen and examined by Dr. Lovena Le who determined she was stable for discharge home. Post-hospital f/u has been arranged with Dr. Harrington Challenger in 4 weeks. It was recommended that she continue to wear her cardiac monitor to assess for any further tachy/bradycardia.   Consults: None  Significant Diagnostic Studies:   Treatments: See Hospital Course  Discharge Exam: Blood pressure 126/54, pulse 53, temperature 99.2 F (37.3 C), temperature source Oral, resp. rate 18, height 5\' 1"  (1.549 m), weight 121 lb 4.1 oz (55 kg), SpO2 97 %.   Disposition:       Discharge Instructions    Diet - low sodium heart healthy    Complete by:  As directed      Increase activity slowly    Complete by:  As directed             Medication List    STOP taking these medications        cyclobenzaprine 10 MG tablet  Commonly known as:  FLEXERIL      TAKE these medications        ALPRAZolam 0.25 MG tablet  Commonly known as:  XANAX  TAKE 1/2 TO 1 TABLET BY MOUTH TWICE DAILY AS NEEDED FOR STRESS     aspirin 81 MG tablet  Take 81 mg by mouth daily.     BLADDER CONTROL PAD REGULAR Misc     budesonide-formoterol 160-4.5 MCG/ACT inhaler  Commonly known as:  SYMBICORT  Inhale 2 puffs into  the lungs 2 (two) times daily.     chlorpheniramine-HYDROcodone 10-8 MG/5ML Lqcr  Commonly known as:  TUSSIONEX  Take 5 mLs by mouth daily as needed for cough.     furosemide 20 MG tablet  Commonly known as:  LASIX  Take 1 tablet daily as needed     HYDROcodone-homatropine 5-1.5 MG/5ML syrup  Commonly known as:  HYCODAN  Take 5 mLs by mouth every 6 (six) hours as needed for cough.     ibuprofen 200 MG tablet  Commonly known as:  ADVIL,MOTRIN  Take 200 mg by mouth daily as needed for headache.     levofloxacin 250 MG tablet  Commonly known as:  LEVAQUIN  Take 1 tablet (250 mg  total) by mouth daily.     lisinopril 5 MG tablet  Commonly known as:  PRINIVIL,ZESTRIL  TAKE 1 TABLET EVERY DAY     metoprolol tartrate 25 MG tablet  Commonly known as:  LOPRESSOR  Take 0.5 tablets (12.5 mg total) by mouth 2 (two) times daily.     predniSONE 10 MG tablet  Commonly known as:  DELTASONE  3 tabs by mouth per day for 3 days,2abs per day for 3 days,1tab per day for 3 days     PRILOSEC PO  Take 20 mg by mouth as needed.     simvastatin 40 MG tablet  Commonly known as:  ZOCOR  TAKE 1 TABLET EVERY DAY     VENTOLIN HFA 108 (90 BASE) MCG/ACT inhaler  Generic drug:  albuterol  Inhale 2 puffs into the lungs every 6 (six) hours as needed for wheezing or shortness of breath.       Follow-up Information    Follow up with Dorris Carnes, MD On 02/23/2014.   Specialty:  Cardiology   Why:  11:45 am    Contact information:   [address] [place] [address] [phone]      TIME SPENT ON DISCHARGE, INCLUDING PHYSICIAN TIME: >30  MINUTES   Signed: Lyda Jester 01/24/2014, 4:24 PM  EP Attending  Patient seen and examined. Agree with above. [place] for discharge.  Mikle Bosworth.D.

## 2014-01-24 NOTE — Progress Notes (Signed)
Patient ID: Kimberly [name], female   DOB: [birth year], 69 y.o.   MRN: [id number]    Patient Name: Kimberly [name] Date of Encounter: 01/24/2014     Principal Problem:   Syncope and collapse Active Problems:   Dyslipidemia   TOBACCO ABUSE   COPD (chronic obstructive pulmonary disease)   VSD- s/p repair x 2   HTN (hypertension)   Cardiomyopathy-EF 40-45% Dec 2015   VT (ventricular tachycardia) reported on Holter Monitor    SUBJECTIVE  Anxious but no chest pain or palpitations.  CURRENT MEDS . aspirin EC  81 mg Oral Daily  . atorvastatin  40 mg Oral q1800  . budesonide-formoterol  2 puff Inhalation BID  . feeding supplement (ENSURE COMPLETE)  237 mL Oral BID BM  . heparin  5,000 Units Subcutaneous 3 times per day  . lisinopril  5 mg Oral Daily  . metoprolol tartrate  25 mg Oral BID  . pantoprazole  40 mg Oral Daily  . sodium chloride  3 mL Intravenous Q12H  . sodium chloride  3 mL Intravenous Q12H    OBJECTIVE  Filed Vitals:   01/24/14 0500 01/24/14 0700 01/24/14 0829 01/24/14 0830  BP:    123/55  Pulse:  65  53  Temp:   96.2 F (35.7 C) 99.2 F (37.3 C)  TempSrc:   Axillary Oral  Resp:    18  Height:      Weight: 121 lb 4.1 oz (55 kg)     SpO2:  98%  97%    Intake/Output Summary (Last 24 hours) at 01/24/14 0906 Last data filed at 01/24/14 0813  Gross per 24 hour  Intake    500 ml  Output     53 ml  Net    447 ml   Filed Weights   01/23/14 1718 01/24/14 0028 01/24/14 0500  Weight: 123 lb 0.3 oz (55.8 kg) 118 lb 13.3 oz (53.9 kg) 121 lb 4.1 oz (55 kg)    PHYSICAL EXAM  General: Pleasant, NAD. Neuro: Alert and oriented X 3. Moves all extremities spontaneously. Psych: Normal affect. HEENT:  Normal  Neck: Supple without bruits or JVD. Lungs:  Resp regular and unlabored, CTA. Heart: RRR no s3, s4, or murmurs. Abdomen: Soft, non-tender, non-distended, BS + x 4.  Extremities: No clubbing, cyanosis or edema. DP/PT/Radials 2+ and equal bilaterally.  Accessory  Clinical Findings  CBC  Recent Labs  01/23/14 1300  WBC 8.3  HGB 15.4*  HCT 46.2*  MCV 89.7  PLT 607   Basic Metabolic Panel  Recent Labs  01/23/14 1300  NA 136  K 4.6  CL 101  CO2 25  GLUCOSE 163*  BUN 16  CREATININE 0.86  CALCIUM 9.2   Liver Function Tests No results for input(s): AST, ALT, ALKPHOS, BILITOT, PROT, ALBUMIN in the last 72 hours. No results for input(s): LIPASE, AMYLASE in the last 72 hours. Cardiac Enzymes No results for input(s): CKTOTAL, CKMB, CKMBINDEX, TROPONINI in the last 72 hours. BNP Invalid input(s): POCBNP D-Dimer No results for input(s): DDIMER in the last 72 hours. Hemoglobin A1C No results for input(s): HGBA1C in the last 72 hours. Fasting Lipid Panel No results for input(s): CHOL, HDL, LDLCALC, TRIG, CHOLHDL, LDLDIRECT in the last 72 hours. Thyroid Function Tests  Recent Labs  01/23/14 1300  TSH 0.548    TELE  nsr  Radiology/Studies  Dg Chest 2 View  01/17/2014   CLINICAL DATA:  Productive cough and wheezing.  EXAM: CHEST  2 VIEW  COMPARISON:  06/29/2013.  FINDINGS: Mediastinum and hilar structures normal. Cardiomegaly. Prior CABG. No focal pulmonary infiltrate. No pleural effusion or pneumothorax. Biapical pleural parenchymal thickening consistent with scarring. Surgical clips right upper quadrant.  IMPRESSION: No acute cardiopulmonary disease. Mild cardiomegaly, stable. Prior CABG.   Electronically Signed   By: Marcello Moores  Register   On: 01/17/2014 13:08   Dg Chest Portable 1 View  01/23/2014   CLINICAL DATA:  Syncope over the past 2 days. Numbness and tingling diffusely.  EXAM: PORTABLE CHEST - 1 VIEW  COMPARISON:  PA and lateral chest 01/17/2014 in 06/29/2013.  FINDINGS: The lungs are clear. Heart size is upper normal. No pneumothorax or pleural effusion. The patient is status post median sternotomy.  IMPRESSION: No acute disease.   Electronically Signed   By: Inge Rise M.D.   On: 01/23/2014 13:37    ASSESSMENT AND  PLAN  1. Wide QRS tachy, though most likely to be SVT (atrial tachy)with abherration 2. Near syncope due to #1 3. Remote VSD repair 4. Anxiety Rec: discussed implicatations of atrial arrhythmias as well as treatment options with her family. Will stop digoxin and continue metoprolol. She can followup with Dr. Harrington Challenger in 3-4 weeks. Ambulate this morning on tele and discharge if ok.  Corinda Ammon,M.D.  Latriece Anstine,M.D.  01/24/2014 9:06 AM

## 2014-01-25 ENCOUNTER — Ambulatory Visit (HOSPITAL_COMMUNITY): Payer: Commercial Managed Care - HMO

## 2014-01-25 ENCOUNTER — Telehealth: Payer: Self-pay | Admitting: Internal Medicine

## 2014-01-25 NOTE — Telephone Encounter (Signed)
New message      Pt is due to have a test today at 11am.  She said she saw Dr Harrington Challenger, Dr Lovena Le and someone else yesterday.  All 3 doctors did not agree on her having the test today.  She want to know if she really need to have the test.  Please call

## 2014-01-25 NOTE — Telephone Encounter (Signed)
Patient would like to reschedule thyroid US that is scheduled today at the hospital. She was told in the hospital her TSH was normal and she wanted to know if she needed the test. Advised it was ordered to eval goiter. Verbalizes understanding. Would like to reschedule for when she comes to [facility] to see Dr. Harrington Challenger on 02/23/14. Number provided for her to call and reschedule

## 2014-02-02 ENCOUNTER — Encounter: Payer: Self-pay | Admitting: Internal Medicine

## 2014-02-13 ENCOUNTER — Encounter: Payer: Self-pay | Admitting: Internal Medicine

## 2014-02-13 ENCOUNTER — Ambulatory Visit (INDEPENDENT_AMBULATORY_CARE_PROVIDER_SITE_OTHER): Payer: Commercial Managed Care - HMO | Admitting: Internal Medicine

## 2014-02-13 VITALS — BP 98/66 | HR 60 | Ht 61.0 in | Wt 123.8 lb

## 2014-02-13 DIAGNOSIS — I472 Ventricular tachycardia, unspecified: Secondary | ICD-10-CM

## 2014-02-13 DIAGNOSIS — I429 Cardiomyopathy, unspecified: Secondary | ICD-10-CM

## 2014-02-13 MED ORDER — LISINOPRIL 2.5 MG PO TABS: 2.5000 mg | ORAL_TABLET | Freq: Every day | ORAL | Status: DC

## 2014-02-13 MED ORDER — LISINOPRIL 5 MG PO TABS: 2.5000 mg | ORAL_TABLET | Freq: Every day | ORAL | Status: DC

## 2014-02-13 NOTE — Progress Notes (Signed)
HPI Patient is a 69 year old with a history of congenital heart disease (s/p VSD repair), PVOD, dyslipidemia, COPD and tobacco use. \  Echo in  showqed LV and RV function were stable.  LVEF was 35 to 40% with akinessi of there anteroseptum  RV was normal. I saw the patinet in December  She was having near syncopal spells   She was set up with event monitor  This showed WCT that was felt like SVT   Allergies  Allergen Reactions  . Codeine     Nausea      Current Outpatient Prescriptions  Medication Sig Dispense Refill  . albuterol (VENTOLIN HFA) 108 (90 BASE) MCG/ACT inhaler Inhale 2 puffs into the lungs every 6 (six) hours as needed for wheezing or shortness of breath.     . ALPRAZolam (XANAX) 0.25 MG tablet TAKE 1/2 TO 1 TABLET BY MOUTH TWICE DAILY AS NEEDED FOR STRESS (Patient taking differently: Take 0.25 mg by mouth at bedtime as needed for anxiety or sleep. TAKE 1/2 TO 1 TABLET BY MOUTH TWICE DAILY AS NEEDED FOR STRESS) 60 tablet 1  . aspirin 81 MG tablet Take 81 mg by mouth daily.      . budesonide-formoterol (SYMBICORT) 160-4.5 MCG/ACT inhaler Inhale 2 puffs into the lungs 2 (two) times daily. (Patient taking differently: Inhale 2 puffs into the lungs 2 (two) times daily as needed. ) 1 Inhaler 11  . chlorpheniramine-HYDROcodone (TUSSIONEX) 10-8 MG/5ML LQCR Take 5 mLs by mouth daily as needed for cough.    Marland Kitchen HYDROcodone-homatropine (HYCODAN) 5-1.5 MG/5ML syrup Take 5 mLs by mouth every 6 (six) hours as needed for cough. 180 mL 0  . ibuprofen (ADVIL,MOTRIN) 200 MG tablet Take 200 mg by mouth daily as needed for headache.     . Incontinence Supply Disposable (BLADDER CONTROL PAD REGULAR) MISC     . lisinopril (PRINIVIL,ZESTRIL) 5 MG tablet TAKE 1 TABLET EVERY DAY 90 tablet 1  . metoprolol tartrate (LOPRESSOR) 25 MG tablet Take 0.5 tablets (12.5 mg total) by mouth 2 (two) times daily. 30 tablet 5  . Omeprazole (PRILOSEC PO) Take 20 mg by mouth as needed (acid reflux).     . simvastatin  (ZOCOR) 40 MG tablet TAKE 1 TABLET EVERY DAY (Patient taking differently: Take 1 tablet by mouth every evening.) 90 tablet 1   No current facility-administered medications for this visit.    Past Medical History  Diagnosis Date  . VSD (ventricular septal defect)   . PVOD (pulmonary veno-occlusive disease)   . COPD (chronic obstructive pulmonary disease)   . Dyslipidemia   . Anxiety   . GERD (gastroesophageal reflux disease)   . Pericarditis   . Thoracic outlet syndrome   . HTN (hypertension) 02/22/2011  . Atrial tachycardia   . Syncope, near     Associated with atrial tachycardia-event recorder 1/16  . 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 CHF (congestive heart failure)   . Heart murmur   . Pneumonia "10 times" (01/23/2014)  . Chronic bronchitis     "1-2 times/yr" (01/23/2014)  . History of stomach ulcers   . Migraines     "stopped many years ago" (01/23/2014)  . Frequency of urination   . Stress incontinence     "was suppose to have been tacked up years ago but I didn't do it"    Past Surgical History  Procedure Laterality Date  . Vsd repair    . Cesarean section  1972  . Coronary  angiogram  2000    No significant CAD  . Cholecystectomy open  1970's  . Tubal ligation  1972  . Cardiac catheterization  "quite a few"  . Myringotomy with tube placement Right 2015    Family History  Problem Relation Age of Onset  . Throat cancer    . Hypertension    . Stroke    . Alcohol abuse Other   . Arthritis Other   . Cancer Other     lung cancer  . Hypertension Other   . Arthritis Other   . Stroke Other     History   Social History  . Marital Status: Married    Spouse Name: N/A    Number of Children: N/A  . Years of Education: 12   Occupational History  . retired    Social History Main Topics  . Smoking status: Current Every Day Smoker -- 0.33 packs/day for 35 years    Types: Cigarettes  . Smokeless tobacco: Never Used      Comment: Smoked 1971-present , up to 1/2 ppd. Intermittent smoking cessation with preganancy up to 1/2-1 year @ a time  . Alcohol Use: Yes     Comment: 01/23/2014 "glass of wine maybe once/month"  . Drug Use: No  . Sexual Activity: No   Other Topics Concern  . Not on file   Social History Narrative   Pt lives with husband.    Review of Systems:  All systems reviewed.  They are negative to the above problem except as previously stated.  Vital Signs: BP 98/66  P 60 Patnet is in NAD HEENT:  Normocephalic, atraumatic. EOMI, PERRLA.  Neck: JVP is normal.  No bruits.  Lungs: Moving air  No  wheeze Heart: Regular rate and rhythm. Normal S1, S2. No S3.  II/VI systolic murmur LUSB PMI not displaced.  Abdomen:  Supple, nontender. Normal bowel sounds. No masses. No hepatomegaly.  Extremities:   Good distal pulses throughout. No lower extremity edema.  Musculoskeletal :moving all extremities.  Neuro:   alert and oriented x3.  CN II-XII grossly intact.  Assessment and Plan: 1.  Rhythm  Consistent with SVT  NOw off of Dig  On low dose lopressor Followe  I have reviewed with EP    1.  COPD  Follow in pulmonary clinic    2. VSD    4.  HTN  Will back dlow no lisinopril to 2.5 mg    3.  Hx VSD  S/p repair.  Repeat echo    4.  HL COntinue statin    5.  Tob abuse  Patient admits to smoking more due to stress  Counselled on slowing down/tapering

## 2014-02-13 NOTE — Patient Instructions (Signed)
Your physician has recommended you make the following change in your medication:  1.) decrease lisinopril to 2.5 mg daily

## 2014-02-15 ENCOUNTER — Telehealth: Payer: Self-pay | Admitting: Internal Medicine

## 2014-02-15 NOTE — Telephone Encounter (Signed)
New problem    This morning after midnight pt had an epiosode of very fast heart beat and had time breathing that last about a few minutes. Pt isn't having any problems at present but want to speak to nurse about it.

## 2014-02-15 NOTE — Telephone Encounter (Signed)
Patient reports NEW episode of waking up with her heart "racing, pounding out of my chest" and she could not catch her breath. She got up and went to the bathroom and upon going back to bed her heart slowed, and breathing improved.  She went back to sleep after a while. Reports this has never occurred before.  She feels normal today.  Pt is aware that I will inform Dr. Harrington Challenger who is not in the office today, and that when she replies I will call her back with any new recommendations. Advised patient if this occurs again to call back.  Pt verbalizes understanding and agreement.  She has decreased her lisinopril to 2.5 as directed at [facility] on 02/13/13.

## 2014-02-17 NOTE — Telephone Encounter (Signed)
Spoke to patinet yesterday evening Had episode of heart racing  Different that what she recently had.  No dizziness.  Slowed gradually on own.   I recomm that she keep on same regimen.  No changes  Call back if recurs.

## 2014-02-20 ENCOUNTER — Telehealth: Payer: Self-pay | Admitting: Internal Medicine

## 2014-02-20 NOTE — Telephone Encounter (Signed)
New Message       Pt calling stating that she had a very minor shock to her heart yesterday that only lasted for a few seconds and that she didn't black out. Pt states that Dr. Harrington Challenger is aware of these occurences and has told her to contact our office whenever they happen. Please call pt back and advise.

## 2014-02-22 NOTE — Telephone Encounter (Signed)
No new recommendations.  Continue to follow

## 2014-02-23 ENCOUNTER — Ambulatory Visit (HOSPITAL_COMMUNITY): Payer: Commercial Managed Care - HMO

## 2014-02-23 ENCOUNTER — Encounter: Payer: Commercial Managed Care - HMO | Admitting: Internal Medicine

## 2014-04-04 ENCOUNTER — Telehealth: Payer: Self-pay | Admitting: Internal Medicine

## 2014-04-04 NOTE — Telephone Encounter (Signed)
New Msg      Pt c/o Shortness Of Breath: STAT if SOB developed within the last 24 hours or pt is noticeably SOB on the phone  1. Are you currently SOB (can you hear that pt is SOB on the phone)? no  2. How long have you been experiencing SOB? Off and on About 2 wks  3. Are you SOB when sitting or when up moving around? both  4. Are you currently experiencing any other symptoms? Heart palpations, extremely fatigued.   Pt upset that she hasn't seen Dr. Harrington Challenger since being released from hospital and she hasn't received any return phone calls in almost a month.    Please return call.

## 2014-04-04 NOTE — Telephone Encounter (Signed)
Pt states she has been having episodes of extreme fatigue with lower extremity edema. Denies diet change or increased sodium intake. Started 2 weeks ago, happens 2-3 times a week. Denies any other symptoms except SOB with Symbicort relieving symptoms.  States her BP/P  Have been ok but could not provide any readings and has not checked it in 1 week. Pt was told to keep track of her BP/P 2xdaily and at times of fatigue. Pt was wondering if she needs an" EKG or something" She was told she would here back Thursday or Friday from Dr/Nurse.  Pt was accepting of plan.

## 2014-04-05 ENCOUNTER — Ambulatory Visit (HOSPITAL_COMMUNITY): Payer: Commercial Managed Care - HMO

## 2014-04-10 NOTE — Telephone Encounter (Signed)
Called patient for update. Continues to c/o feeling SOB, followed by fast heart rate.  Taking all meds as prescribed.   Uses her BP monitor daily--BP runs 130s / 70s.  HR only reports IRREGULAR. She is fatigued but has been babysitting 42 mo old granddaughter.  Advised I will update Dr. Harrington Challenger and we will follow up with her. Pt verbalizes understanding and appreciation for the call.

## 2014-04-11 NOTE — Telephone Encounter (Signed)
I spoke with the pt about arranging a follow-up appointment and she does not want to see a PA/NP.  The pt only wants to follow-up with Dr Harrington Challenger.  The pt states that she is feeling okay today and I made her aware that Dr Harrington Challenger is not back in our office until the beginning of April.  The pt would still like to wait for an appointment to Dr Harrington Challenger.  I will forward this message to Michalene to follow-up with the pt in regards to appointment.

## 2014-04-11 NOTE — Telephone Encounter (Signed)
See if patient can be seen this week

## 2014-04-12 ENCOUNTER — Other Ambulatory Visit: Payer: Self-pay | Admitting: *Deleted

## 2014-04-12 NOTE — Telephone Encounter (Signed)
Appointment made today for 05/01/14 with Dr. Harrington Challenger.

## 2014-05-01 ENCOUNTER — Ambulatory Visit (INDEPENDENT_AMBULATORY_CARE_PROVIDER_SITE_OTHER): Payer: Commercial Managed Care - HMO | Admitting: Internal Medicine

## 2014-05-01 ENCOUNTER — Encounter: Payer: Self-pay | Admitting: Internal Medicine

## 2014-05-01 VITALS — BP 134/70 | HR 62 | Ht 61.0 in | Wt 126.4 lb

## 2014-05-01 DIAGNOSIS — I10 Essential (primary) hypertension: Secondary | ICD-10-CM

## 2014-05-01 DIAGNOSIS — I471 Supraventricular tachycardia: Secondary | ICD-10-CM

## 2014-05-01 DIAGNOSIS — Q210 Ventricular septal defect: Secondary | ICD-10-CM

## 2014-05-01 DIAGNOSIS — Q254 Other congenital malformations of aorta: Secondary | ICD-10-CM

## 2014-05-01 DIAGNOSIS — Q2542 Hypoplasia of aorta: Principal | ICD-10-CM

## 2014-05-01 DIAGNOSIS — R0600 Dyspnea, unspecified: Secondary | ICD-10-CM

## 2014-05-01 MED ORDER — ALPRAZOLAM 0.25 MG PO TABS: 0.2500 mg | ORAL_TABLET | Freq: Two times a day (BID) | ORAL | Status: DC | PRN

## 2014-05-01 NOTE — Patient Instructions (Addendum)
  Your physician recommends that you continue on your current medications as directed. Please refer to the Current Medication list given to you today. You will receive a call from [facility] regarding following up with Dr. Ross/Dr. Lovena Le.

## 2014-05-01 NOTE — Progress Notes (Signed)
Cardiology Office Note   Date:  05/01/2014   ID:  Kimberly, [name] 10/25/1945, MRN [id number]  PCP:  Cathlean Cower, MD  Cardiologist:   Dorris Carnes, MD   Patinet presents for evaluation of palpitations and edema      History of Present Illness: Kimberly [name] is a 69 y.o. female with a history of congenital heart disease (s/p VSD repair), PVOD, dyslipidemia, COPD and tobacco use. \  Echo in showqed LV and RV function were stable. LVEF was 35 to 40% with akinessi of there anteroseptum RV was normal. She had WCT on event monitor  Felt to be SVT I saw her in Jan Since seen she has had 2 minor spells of heart racine.  Had wors spell though on Sat 1 wk ago  Was in kitchen at time  Got dizzy    Last about 5 min.  BP checked  Was 167/   HR was reported in "red zone" but no number given  She has had intermitt edema  Some chest tightness every few days  Not associated with edema or with activity.  No syncope     Current Outpatient Prescriptions  Medication Sig Dispense Refill  . albuterol (VENTOLIN HFA) 108 (90 BASE) MCG/ACT inhaler Inhale 2 puffs into the lungs every 6 (six) hours as needed for wheezing or shortness of breath.     . ALPRAZolam (XANAX) 0.25 MG tablet TAKE 1/2 TO 1 TABLET BY MOUTH TWICE DAILY AS NEEDED FOR STRESS (Patient taking differently: Take 0.25 mg by mouth at bedtime as needed for anxiety or sleep. TAKE 1/2 TO 1 TABLET BY MOUTH TWICE DAILY AS NEEDED FOR STRESS) 60 tablet 1  . aspirin 81 MG tablet Take 81 mg by mouth daily.      . budesonide-formoterol (SYMBICORT) 160-4.5 MCG/ACT inhaler Inhale 2 puffs into the lungs 2 (two) times daily. (Patient taking differently: Inhale 2 puffs into the lungs 2 (two) times daily as needed. ) 1 Inhaler 11  . chlorpheniramine-HYDROcodone (TUSSIONEX) 10-8 MG/5ML LQCR Take 5 mLs by mouth daily as needed for cough.    Marland Kitchen HYDROcodone-homatropine (HYCODAN) 5-1.5 MG/5ML syrup Take 5 mLs by mouth every 6 (six) hours as needed for cough. 180  mL 0  . ibuprofen (ADVIL,MOTRIN) 200 MG tablet Take 200 mg by mouth daily as needed for headache.     . Incontinence Supply Disposable (BLADDER CONTROL PAD REGULAR) MISC     . lisinopril (PRINIVIL,ZESTRIL) 2.5 MG tablet Take 1 tablet (2.5 mg total) by mouth daily. 90 tablet 3  . metoprolol tartrate (LOPRESSOR) 25 MG tablet Take 0.5 tablets (12.5 mg total) by mouth 2 (two) times daily. 30 tablet 5  . Omeprazole (PRILOSEC PO) Take 20 mg by mouth as needed (acid reflux).     . simvastatin (ZOCOR) 40 MG tablet TAKE 1 TABLET EVERY DAY (Patient taking differently: Take 1 tablet by mouth every evening.) 90 tablet 1   No current facility-administered medications for this visit.    Allergies:   Codeine   Past Medical History  Diagnosis Date  . VSD (ventricular septal defect)   . PVOD (pulmonary veno-occlusive disease)   . COPD (chronic obstructive pulmonary disease)   . Dyslipidemia   . Anxiety   . GERD (gastroesophageal reflux disease)   . Pericarditis   . Thoracic outlet syndrome   . HTN (hypertension) 02/22/2011  . Atrial tachycardia   . Syncope, near     Associated with atrial tachycardia-event recorder 1/16  . Right  ventricular outflow tract premature ventricular contractions (PVCs)   . CHF (congestive heart failure)   . Heart murmur   . Pneumonia "10 times" (01/23/2014)  . Chronic bronchitis     "1-2 times/yr" (01/23/2014)  . History of stomach ulcers   . Migraines     "stopped many years ago" (01/23/2014)  . Frequency of urination   . Stress incontinence     "was suppose to have been tacked up years ago but I didn't do it"    Past Surgical History  Procedure Laterality Date  . Vsd repair    . Cesarean section  1972  . Coronary angiogram  2000    No significant CAD  . Cholecystectomy open  1970's  . Tubal ligation  1972  . Cardiac catheterization  "quite a few"  . Myringotomy with tube placement Right 2015     Social History:  The patient  reports that she has been smoking  Cigarettes.  She has a 11.55 pack-year smoking history. She has never used smokeless tobacco. She reports that she drinks alcohol. She reports that she does not use illicit drugs.   Family History:  The patient's family history includes Alcohol abuse in her other; Arthritis in her other and other; Cancer in her other; Hypertension in her other and another family member; Stroke in her other and another family member; Throat cancer in an other family member.    ROS:  Please see the history of present illness. All other systems are reviewed and  Negative to the above problem except as noted.    PHYSICAL EXAM: VS:  BP 134/70 mmHg  Pulse 62  Ht 5\' 1"  (1.549 m)  Wt 126 lb 6.4 oz (57.335 kg)  BMI 23.90 kg/m2  GEN: Well nourished, well developed, in no acute distress HEENT: normal Neck: no JVD, carotid bruits, or masses Cardiac: RRR;  III/VI systoloic murmur SSB  , rubs, or gallops,no edema  Respiratory:  clear to auscultation bilaterally, normal work of breathing GI: soft, nontender, nondistended, + BS  No hepatomegaly  MS: no deformity Moving all extremities   Skin: warm and dry, no rash Neuro:  Strength and sensation are intact Psych: euthymic mood, full affect   EKG:  EKG is not ordered today.   Lipid Panel    Component Value Date/Time   CHOL 207* 03/16/2013 1057   TRIG 71.0 03/16/2013 1057   HDL 77.40 03/16/2013 1057   CHOLHDL 3 03/16/2013 1057   VLDL 14.2 03/16/2013 1057   LDLCALC 82 11/18/2010 1047   LDLDIRECT 112.6 03/16/2013 1057      Wt Readings from Last 3 Encounters:  05/01/14 126 lb 6.4 oz (57.335 kg)  02/13/14 123 lb 12.8 oz (56.155 kg)  01/24/14 121 lb 4.1 oz (55 kg)      ASSESSMENT AND PLAN:  1.  Palpitations.  Patient with one spell recently  Very sympmotatic.  WIll review with EP   Keep on same regimen  2.  Edema.  Intermitt  Volume status today is good.  Difficult to explain  No dietary changes that she reports   3.  Chest tightness  Patient with COPD   Could explain all but also concerniing is possible CAD  She has not had a myvoiew  Declined in past.      Current medicines are reviewed at length with the patient today.  The patient does not have concerns regarding medicines.  The following changes have been made:   Labs/ tests ordered today include: No  orders of the defined types were placed in this encounter.     Disposition:   FU with  in   Signed, Dorris Carnes, MD  05/01/2014 2:21 PM    [facility] [address], [place], [place]  [address] Phone: [phone]; Fax: [phone]

## 2014-05-16 ENCOUNTER — Institutional Professional Consult (permissible substitution): Payer: Commercial Managed Care - HMO | Admitting: Internal Medicine

## 2014-06-01 ENCOUNTER — Ambulatory Visit (INDEPENDENT_AMBULATORY_CARE_PROVIDER_SITE_OTHER): Payer: Commercial Managed Care - HMO | Admitting: Internal Medicine

## 2014-06-01 ENCOUNTER — Encounter: Payer: Self-pay | Admitting: Internal Medicine

## 2014-06-01 VITALS — BP 102/62 | HR 94 | Ht 61.0 in | Wt 123.2 lb

## 2014-06-01 DIAGNOSIS — I472 Ventricular tachycardia, unspecified: Secondary | ICD-10-CM

## 2014-06-01 DIAGNOSIS — Q210 Ventricular septal defect: Secondary | ICD-10-CM

## 2014-06-01 DIAGNOSIS — F17200 Nicotine dependence, unspecified, uncomplicated: Secondary | ICD-10-CM

## 2014-06-01 DIAGNOSIS — I10 Essential (primary) hypertension: Secondary | ICD-10-CM | POA: Diagnosis not present

## 2014-06-01 DIAGNOSIS — Z01812 Encounter for preprocedural laboratory examination: Secondary | ICD-10-CM

## 2014-06-01 DIAGNOSIS — Z720 Tobacco use: Secondary | ICD-10-CM

## 2014-06-01 DIAGNOSIS — I471 Supraventricular tachycardia: Secondary | ICD-10-CM

## 2014-06-01 LAB — BASIC METABOLIC PANEL
BUN: 9 mg/dL (ref 6–23)
CALCIUM: 9 mg/dL (ref 8.4–10.5)
CO2: 31 mEq/L (ref 19–32)
Chloride: 102 mEq/L (ref 96–112)
Creatinine, Ser: 0.72 mg/dL (ref 0.40–1.20)
GFR: 85.8 mL/min (ref >60.00)
GLUCOSE: 73 mg/dL (ref 70–99)
Potassium: 3.7 mEq/L (ref 3.5–5.1)
Sodium: 139 mEq/L (ref 135–145)

## 2014-06-01 LAB — CBC WITH DIFFERENTIAL/PLATELET
BASOS PCT: 0.6 % (ref 0.0–3.0)
Basophils Absolute: 0 10*3/uL (ref 0.0–0.1)
EOS ABS: 0.1 10*3/uL (ref 0.0–0.7)
Eosinophils Relative: 1 % (ref 0.0–5.0)
HCT: 40.4 % (ref 36.0–46.0)
Hemoglobin: 13.6 g/dL (ref 12.0–15.0)
LYMPHS PCT: 31.2 % (ref 12.0–46.0)
Lymphs Abs: 2.2 10*3/uL (ref 0.7–4.0)
MCHC: 33.8 g/dL (ref 30.0–36.0)
MCV: 88.1 fl (ref 78.0–100.0)
MONO ABS: 0.8 10*3/uL (ref 0.1–1.0)
Monocytes Relative: 10.8 % (ref 3.0–12.0)
NEUTROS PCT: 56.4 % (ref 43.0–77.0)
Neutro Abs: 4.1 10*3/uL (ref 1.4–7.7)
PLATELETS: 252 10*3/uL (ref 150.0–400.0)
RBC: 4.58 Mil/uL (ref 3.87–5.11)
RDW: 14.9 % (ref 11.5–15.5)
WBC: 7.2 10*3/uL (ref 4.0–10.5)

## 2014-06-01 LAB — TSH: TSH: 0.94 u[IU]/mL (ref 0.35–4.50)

## 2014-06-01 LAB — PROTIME-INR
INR: 1.2 ratio — ABNORMAL HIGH (ref 0.8–1.0)
Prothrombin Time: 12.8 s (ref 9.6–13.1)

## 2014-06-01 NOTE — Assessment & Plan Note (Signed)
The mechanism of her wide QRS tachycardia is unclear. I have recommended EP study. If she has SVT cathter ablation would be warranted. If she has inducible VT, with LV dysfunction and syncope then ICD would be indicated.

## 2014-06-01 NOTE — Patient Instructions (Signed)
Medication Instructions:  Your physician recommends that you continue on your current medications as directed. Please refer to the Current Medication list given to you today.   Labwork: Your physician recommends that you return for lab work: TODAY (CBC, INR, TSH, BMET)   Testing/Procedures: NONE  Follow-Up: We will call you with your follow up appointment.    Any Other Special Instructions Will Be Listed Below (If Applicable).

## 2014-06-01 NOTE — Assessment & Plan Note (Signed)
Her blood pressure is well controlled. No change in medications. She will maintain a low sodium diet.

## 2014-06-01 NOTE — Progress Notes (Signed)
HPI [name] is referred today by Dr. Harrington Challenger for evaluation of a wide QRS tachy in the setting of prior VSD. She is a pleasant 69 yo woman who underwent VSD repair years ago. She has moderate LV dysfunction with an EF of 40%. She had done well but has had recurrent palpitations and near syncope. She wore a cardiac monitor and was found to have a wide QRS tachycardia at over 250/min. She has never had syncope but does note lightheadedness and near syncope with the episodes. She has been on medical therapy with a beta blocker but has had persistent symptoms.  Allergies  Allergen Reactions  . Codeine     Nausea      Current Outpatient Prescriptions  Medication Sig Dispense Refill  . albuterol (VENTOLIN HFA) 108 (90 BASE) MCG/ACT inhaler Inhale 2 puffs into the lungs every 6 (six) hours as needed for wheezing or shortness of breath.     . ALPRAZolam (XANAX) 0.25 MG tablet Take 1 tablet (0.25 mg total) by mouth 2 (two) times daily as needed for anxiety. 60 tablet 0  . aspirin 81 MG tablet Take 81 mg by mouth daily.      . budesonide-formoterol (SYMBICORT) 160-4.5 MCG/ACT inhaler Inhale 2 puffs into the lungs 2 (two) times daily as needed (wheezing and sob).    . chlorpheniramine-HYDROcodone (TUSSIONEX) 10-8 MG/5ML LQCR Take 5 mLs by mouth daily as needed for cough.    Marland Kitchen HYDROcodone-homatropine (HYCODAN) 5-1.5 MG/5ML syrup Take 5 mLs by mouth every 6 (six) hours as needed for cough. 180 mL 0  . ibuprofen (ADVIL,MOTRIN) 200 MG tablet Take 200 mg by mouth daily as needed for headache.     . Incontinence Supply Disposable (BLADDER CONTROL PAD REGULAR) MISC     . lisinopril (PRINIVIL,ZESTRIL) 2.5 MG tablet Take 1 tablet (2.5 mg total) by mouth daily. 90 tablet 3  . metoprolol tartrate (LOPRESSOR) 25 MG tablet Take 0.5 tablets (12.5 mg total) by mouth 2 (two) times daily. 30 tablet 5  . Omeprazole (PRILOSEC PO) Take 20 mg by mouth as needed (acid reflux).     . simvastatin (ZOCOR) 40 MG tablet  TAKE 1 TABLET EVERY DAY 90 tablet 1   No current facility-administered medications for this visit.     Past Medical History  Diagnosis Date  . VSD (ventricular septal defect)   . PVOD (pulmonary veno-occlusive disease)   . COPD (chronic obstructive pulmonary disease)   . Dyslipidemia   . Anxiety   . GERD (gastroesophageal reflux disease)   . Pericarditis   . Thoracic outlet syndrome   . HTN (hypertension) 02/22/2011  . Atrial tachycardia   . Syncope, near     Associated with atrial tachycardia-event recorder 1/16  . 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 CHF (congestive heart failure)   . Heart murmur   . Pneumonia "10 times" (01/23/2014)  . Chronic bronchitis     "1-2 times/yr" (01/23/2014)  . History of stomach ulcers   . Migraines     "stopped many years ago" (01/23/2014)  . Frequency of urination   . Stress incontinence     "was suppose to have been tacked up years ago but I didn't do it"    ROS:   All systems reviewed and negative except as noted in the HPI.   Past Surgical History  Procedure Laterality Date  . Vsd repair    . Cesarean section  1972  . Coronary angiogram  2000    No significant CAD  . Cholecystectomy open  1970's  . Tubal ligation  1972  . Cardiac catheterization  "quite a few"  . Myringotomy with tube placement Right 2015     Family History  Problem Relation Age of Onset  . Throat cancer    . Hypertension    . Stroke    . Alcohol abuse Other   . Arthritis Other   . Cancer Other     lung cancer  . Hypertension Other   . Arthritis Other   . Stroke Other   . Cancer Mother   . Cancer Father      History   Social History  . Marital Status: Married    Spouse Name: N/A  . Number of Children: N/A  . Years of Education: 12   Occupational History  . retired    Social History Main Topics  . Smoking status: Current Every Day Smoker -- 0.33 packs/day for 35 years    Types: Cigarettes  .  Smokeless tobacco: Never Used     Comment: Smoked 1971-present , up to 1/2 ppd. Intermittent smoking cessation with preganancy up to 1/2-1 year @ a time  . Alcohol Use: Yes     Comment: 01/23/2014 "glass of wine maybe once/month"  . Drug Use: No  . Sexual Activity: No   Other Topics Concern  . Not on file   Social History Narrative   Pt lives with husband.     BP 102/62 mmHg  Pulse 94  Ht 5\' 1"  (1.549 m)  Wt 123 lb 3.2 oz (55.883 kg)  BMI 23.29 kg/m2  Physical Exam:  Well appearing 69 yo woman, NAD HEENT: Unremarkable Neck:  No JVD, no thyromegally Lymphatics:  No adenopathy Back:  No CVA tenderness Lungs:  Clear with no wheezes HEART:  Regular rate rhythm, soft systolic murmur at the base, no rubs, no clicks Abd:  soft, positive bowel sounds, no organomegally, no rebound, no guarding Ext:  2 plus pulses, no edema, no cyanosis, no clubbing Skin:  No rashes no nodules Neuro:  CN II through XII intact, motor grossly intact  ECG - nSR with RBBB   Assess/Plan:

## 2014-06-01 NOTE — Assessment & Plan Note (Signed)
By echo no evidence of severe pulmonary HTN. Will follow.

## 2014-06-01 NOTE — Assessment & Plan Note (Signed)
She is encouraged to stop smoking

## 2014-06-08 ENCOUNTER — Telehealth: Payer: Self-pay | Admitting: Internal Medicine

## 2014-06-08 NOTE — Telephone Encounter (Signed)
New Message       Pt calling wanting to speak to Desiree Lucy, pt states that she knows Webb Silversmith and wants to speak to her in regards to a personal matter. Pt states that if she is not at home please call her cell phone.

## 2014-06-08 NOTE — Telephone Encounter (Signed)
I spoke with patient, answered questions.

## 2014-06-14 ENCOUNTER — Encounter (HOSPITAL_COMMUNITY)
Admission: RE | Disposition: A | Discharge status: Home / Self Care | Payer: Commercial Managed Care - HMO | Source: Ambulatory Visit | Attending: Internal Medicine

## 2014-06-14 ENCOUNTER — Ambulatory Visit (HOSPITAL_COMMUNITY)
Admission: RE | Admit: 2014-06-14 | Discharge: 2014-06-15 | Disposition: A | Discharge status: Home / Self Care | Payer: Commercial Managed Care - HMO | Source: Ambulatory Visit | Attending: Internal Medicine | Admitting: Internal Medicine

## 2014-06-14 ENCOUNTER — Encounter (HOSPITAL_COMMUNITY): Payer: Self-pay | Admitting: General Practice

## 2014-06-14 DIAGNOSIS — E785 Hyperlipidemia, unspecified: Secondary | ICD-10-CM | POA: Diagnosis not present

## 2014-06-14 DIAGNOSIS — I472 Ventricular tachycardia, unspecified: Secondary | ICD-10-CM

## 2014-06-14 DIAGNOSIS — K219 Gastro-esophageal reflux disease without esophagitis: Secondary | ICD-10-CM | POA: Insufficient documentation

## 2014-06-14 DIAGNOSIS — Z885 Allergy status to narcotic agent status: Secondary | ICD-10-CM | POA: Diagnosis not present

## 2014-06-14 DIAGNOSIS — I10 Essential (primary) hypertension: Secondary | ICD-10-CM | POA: Insufficient documentation

## 2014-06-14 DIAGNOSIS — F17210 Nicotine dependence, cigarettes, uncomplicated: Secondary | ICD-10-CM | POA: Diagnosis not present

## 2014-06-14 DIAGNOSIS — I4892 Unspecified atrial flutter: Secondary | ICD-10-CM | POA: Diagnosis not present

## 2014-06-14 DIAGNOSIS — R55 Syncope and collapse: Secondary | ICD-10-CM | POA: Diagnosis present

## 2014-06-14 DIAGNOSIS — I471 Supraventricular tachycardia, unspecified: Secondary | ICD-10-CM | POA: Diagnosis present

## 2014-06-14 DIAGNOSIS — G540 Brachial plexus disorders: Secondary | ICD-10-CM | POA: Insufficient documentation

## 2014-06-14 DIAGNOSIS — J449 Chronic obstructive pulmonary disease, unspecified: Secondary | ICD-10-CM | POA: Insufficient documentation

## 2014-06-14 DIAGNOSIS — I509 Heart failure, unspecified: Secondary | ICD-10-CM | POA: Insufficient documentation

## 2014-06-14 DIAGNOSIS — Z809 Family history of malignant neoplasm, unspecified: Secondary | ICD-10-CM | POA: Diagnosis not present

## 2014-06-14 DIAGNOSIS — Z7982 Long term (current) use of aspirin: Secondary | ICD-10-CM | POA: Insufficient documentation

## 2014-06-14 HISTORY — DX: Cardiac arrhythmia, unspecified: I49.9

## 2014-06-14 HISTORY — PX: SVT ABLATION: EP1225

## 2014-06-14 HISTORY — DX: Adhesive capsulitis of right shoulder: M75.01

## 2014-06-14 HISTORY — PX: ELECTROPHYSIOLOGIC STUDY: SHX172A

## 2014-06-14 HISTORY — DX: Acute myocardial infarction, unspecified: I21.9

## 2014-06-14 LAB — SURGICAL PCR SCREEN
MRSA, PCR: NEGATIVE
STAPHYLOCOCCUS AUREUS: POSITIVE — AB

## 2014-06-14 SURGERY — A-FLUTTER/A-TACH/SVT ABLATION

## 2014-06-14 MED ORDER — MIDAZOLAM HCL 5 MG/5ML IJ SOLN
INTRAMUSCULAR | Status: DC | PRN
  Administered 2014-06-14: 2 mg via INTRAVENOUS
  Administered 2014-06-14 (×4): 1 mg via INTRAVENOUS
  Administered 2014-06-14: 2 mg via INTRAVENOUS
  Administered 2014-06-14 (×3): 1 mg via INTRAVENOUS

## 2014-06-14 MED ORDER — HYDROCODONE-HOMATROPINE 5-1.5 MG/5ML PO SYRP: 5.0000 mL | ORAL_SOLUTION | Freq: Four times a day (QID) | ORAL | Status: DC | PRN

## 2014-06-14 MED ORDER — SODIUM CHLORIDE 0.9 % IR SOLN
80.0000 mg | Status: DC
  Filled 2014-06-14: qty 2

## 2014-06-14 MED ORDER — METOPROLOL TARTRATE 12.5 MG HALF TABLET
12.5000 mg | ORAL_TABLET | Freq: Two times a day (BID) | ORAL | Status: DC
  Administered 2014-06-14: 12.5 mg via ORAL
  Filled 2014-06-14 (×2): qty 1

## 2014-06-14 MED ORDER — MUPIROCIN 2 % EX OINT
TOPICAL_OINTMENT | CUTANEOUS | Status: AC
  Administered 2014-06-14: 11:00:00
  Filled 2014-06-14: qty 22

## 2014-06-14 MED ORDER — SODIUM CHLORIDE 0.9 % IV SOLN
INTRAVENOUS | Status: DC
  Administered 2014-06-14: 11:00:00 via INTRAVENOUS

## 2014-06-14 MED ORDER — CHLORHEXIDINE GLUCONATE 4 % EX LIQD
60.0000 mL | Freq: Once | CUTANEOUS | Status: DC
  Filled 2014-06-14: qty 60

## 2014-06-14 MED ORDER — LISINOPRIL 5 MG PO TABS
2.5000 mg | ORAL_TABLET | Freq: Every day | ORAL | Status: DC
Start: 2014-06-15 — End: 2014-06-15
  Administered 2014-06-15: 09:00:00 2.5 mg via ORAL
  Filled 2014-06-14: qty 1

## 2014-06-14 MED ORDER — FENTANYL CITRATE (PF) 100 MCG/2ML IJ SOLN
INTRAMUSCULAR | Status: AC
  Filled 2014-06-14: qty 2

## 2014-06-14 MED ORDER — ONDANSETRON HCL 4 MG/2ML IJ SOLN
4.0000 mg | Freq: Four times a day (QID) | INTRAMUSCULAR | Status: DC | PRN
Start: 2014-06-14 — End: 2014-06-15

## 2014-06-14 MED ORDER — SIMVASTATIN 20 MG PO TABS: 40.0000 mg | ORAL_TABLET | Freq: Every day | ORAL | Status: DC

## 2014-06-14 MED ORDER — BUPIVACAINE HCL (PF) 0.25 % IJ SOLN
INTRAMUSCULAR | Status: AC
  Filled 2014-06-14: qty 60

## 2014-06-14 MED ORDER — LIDOCAINE HCL (PF) 1 % IJ SOLN
INTRAMUSCULAR | Status: AC
  Filled 2014-06-14: qty 30

## 2014-06-14 MED ORDER — SODIUM CHLORIDE 0.9 % IJ SOLN: 3.0000 mL | INTRAMUSCULAR | Status: DC | PRN

## 2014-06-14 MED ORDER — MUPIROCIN 2 % EX OINT: 1.0000 "application " | TOPICAL_OINTMENT | Freq: Once | CUTANEOUS | Status: DC

## 2014-06-14 MED ORDER — ATORVASTATIN CALCIUM 20 MG PO TABS: 20.0000 mg | ORAL_TABLET | Freq: Every day | ORAL | Status: DC

## 2014-06-14 MED ORDER — SODIUM CHLORIDE 0.9 % IJ SOLN
3.0000 mL | Freq: Two times a day (BID) | INTRAMUSCULAR | Status: DC
  Administered 2014-06-14: 21:00:00 3 mL via INTRAVENOUS

## 2014-06-14 MED ORDER — MIDAZOLAM HCL 5 MG/5ML IJ SOLN
INTRAMUSCULAR | Status: AC
  Filled 2014-06-14: qty 5

## 2014-06-14 MED ORDER — HEPARIN (PORCINE) IN NACL 2-0.9 UNIT/ML-% IJ SOLN
INTRAMUSCULAR | Status: AC
  Filled 2014-06-14: qty 500

## 2014-06-14 MED ORDER — IBUPROFEN 200 MG PO TABS
200.0000 mg | ORAL_TABLET | Freq: Every day | ORAL | Status: DC | PRN
  Administered 2014-06-14 – 2014-06-15 (×2): 200 mg via ORAL
  Filled 2014-06-14 (×2): qty 1

## 2014-06-14 MED ORDER — SODIUM CHLORIDE 0.9 % IV SOLN: 250.0000 mL | INTRAVENOUS | Status: DC | PRN

## 2014-06-14 MED ORDER — ENSURE ENLIVE PO LIQD
237.0000 mL | Freq: Two times a day (BID) | ORAL | Status: DC
  Filled 2014-06-14 (×4): qty 237

## 2014-06-14 MED ORDER — BUDESONIDE-FORMOTEROL FUMARATE 160-4.5 MCG/ACT IN AERO
2.0000 | INHALATION_SPRAY | Freq: Two times a day (BID) | RESPIRATORY_TRACT | Status: DC | PRN
  Filled 2014-06-14: qty 6

## 2014-06-14 MED ORDER — BUPIVACAINE HCL (PF) 0.25 % IJ SOLN
INTRAMUSCULAR | Status: DC | PRN
  Administered 2014-06-14: 60 mL

## 2014-06-14 MED ORDER — PANTOPRAZOLE SODIUM 40 MG PO TBEC
40.0000 mg | DELAYED_RELEASE_TABLET | Freq: Every day | ORAL | Status: DC
  Administered 2014-06-14: 21:00:00 40 mg via ORAL
  Filled 2014-06-14 (×2): qty 1

## 2014-06-14 MED ORDER — ALPRAZOLAM 0.25 MG PO TABS: 0.2500 mg | ORAL_TABLET | Freq: Two times a day (BID) | ORAL | Status: DC | PRN

## 2014-06-14 MED ORDER — FENTANYL CITRATE (PF) 100 MCG/2ML IJ SOLN
INTRAMUSCULAR | Status: DC | PRN
  Administered 2014-06-14 (×6): 12.5 ug via INTRAVENOUS
  Administered 2014-06-14: 25 ug via INTRAVENOUS
  Administered 2014-06-14 (×2): 12.5 ug via INTRAVENOUS
  Administered 2014-06-14: 25 ug via INTRAVENOUS
  Administered 2014-06-14: 12.5 ug via INTRAVENOUS

## 2014-06-14 MED ORDER — CEFAZOLIN SODIUM-DEXTROSE 2-3 GM-% IV SOLR: 2.0000 g | INTRAVENOUS | Status: DC

## 2014-06-14 MED ORDER — AMIODARONE HCL 200 MG PO TABS
400.0000 mg | ORAL_TABLET | Freq: Every day | ORAL | Status: DC
  Administered 2014-06-14 – 2014-06-15 (×2): 400 mg via ORAL
  Filled 2014-06-14 (×3): qty 2

## 2014-06-14 MED ORDER — ALBUTEROL SULFATE (2.5 MG/3ML) 0.083% IN NEBU: 3.0000 mL | INHALATION_SOLUTION | Freq: Four times a day (QID) | RESPIRATORY_TRACT | Status: DC | PRN

## 2014-06-14 MED ORDER — ACETAMINOPHEN 325 MG PO TABS: 650.0000 mg | ORAL_TABLET | ORAL | Status: DC | PRN

## 2014-06-14 SURGICAL SUPPLY — 12 items
BAG SNAP BAND KOVER 36X36 (MISCELLANEOUS) ×3 IMPLANT
CATH EZ STEER NAV 4MM D-F CUR (ABLATOR) ×3 IMPLANT
CATH HEX JOS 2-5-2 65CM 6F REP (CATHETERS) ×3 IMPLANT
CATH JOSEPH QUAD ALLRED 6F REP (CATHETERS) ×6 IMPLANT
PACK EP LATEX FREE (CUSTOM PROCEDURE TRAY) ×2
PACK EP LF (CUSTOM PROCEDURE TRAY) ×1 IMPLANT
PAD DEFIB LIFELINK (PAD) ×3 IMPLANT
PATCH CARTO3 (PAD) ×3 IMPLANT
SHEATH PINNACLE 6F 10CM (SHEATH) ×6 IMPLANT
SHEATH PINNACLE 7F 10CM (SHEATH) ×3 IMPLANT
SHEATH PINNACLE 8F 10CM (SHEATH) ×3 IMPLANT
SHIELD RADPAD SCOOP 12X17 (MISCELLANEOUS) ×3 IMPLANT

## 2014-06-14 NOTE — Progress Notes (Signed)
Site area: rt IJ Site Prior to Removal:  Level  0 Pressure Applied For:  10 minutes Manual:   yes Patient Status During Pull:  stable Post Pull Site:  Level  0 Post Pull Instructions Given:  yes Post Pull Pulses Present: NA Dressing Applied:  Small tegaderm Bedrest begins @  Comments:

## 2014-06-14 NOTE — Interval H&P Note (Signed)
History and Physical Interval Note:  06/14/2014 2:50 PM  Kimberly [name]  has presented today for surgery, with the diagnosis of SVT  The various methods of treatment have been discussed with the patient and family. After consideration of risks, benefits and other options for treatment, the patient has consented to  Procedure(s): V Tach Ablation (N/A) ICD Implant (N/A) as a surgical intervention .  The patient's history has been reviewed, patient examined, no change in status, stable for surgery.  I have reviewed the patient's chart and labs.  Questions were answered to the patient's satisfaction.     Mikle Bosworth.D.

## 2014-06-14 NOTE — H&P (View-Only) (Signed)
HPI Kimberly [name] is referred today by Dr. Harrington Challenger for evaluation of a wide QRS tachy in the setting of prior VSD. She is a pleasant 69 yo woman who underwent VSD repair years ago. She has moderate LV dysfunction with an EF of 40%. She had done well but has had recurrent palpitations and near syncope. She wore a cardiac monitor and was found to have a wide QRS tachycardia at over 250/min. She has never had syncope but does note lightheadedness and near syncope with the episodes. She has been on medical therapy with a beta blocker but has had persistent symptoms.  Allergies  Allergen Reactions  . Codeine     Nausea      Current Outpatient Prescriptions  Medication Sig Dispense Refill  . albuterol (VENTOLIN HFA) 108 (90 BASE) MCG/ACT inhaler Inhale 2 puffs into the lungs every 6 (six) hours as needed for wheezing or shortness of breath.     . ALPRAZolam (XANAX) 0.25 MG tablet Take 1 tablet (0.25 mg total) by mouth 2 (two) times daily as needed for anxiety. 60 tablet 0  . aspirin 81 MG tablet Take 81 mg by mouth daily.      . budesonide-formoterol (SYMBICORT) 160-4.5 MCG/ACT inhaler Inhale 2 puffs into the lungs 2 (two) times daily as needed (wheezing and sob).    . chlorpheniramine-HYDROcodone (TUSSIONEX) 10-8 MG/5ML LQCR Take 5 mLs by mouth daily as needed for cough.    Marland Kitchen HYDROcodone-homatropine (HYCODAN) 5-1.5 MG/5ML syrup Take 5 mLs by mouth every 6 (six) hours as needed for cough. 180 mL 0  . ibuprofen (ADVIL,MOTRIN) 200 MG tablet Take 200 mg by mouth daily as needed for headache.     . Incontinence Supply Disposable (BLADDER CONTROL PAD REGULAR) MISC     . lisinopril (PRINIVIL,ZESTRIL) 2.5 MG tablet Take 1 tablet (2.5 mg total) by mouth daily. 90 tablet 3  . metoprolol tartrate (LOPRESSOR) 25 MG tablet Take 0.5 tablets (12.5 mg total) by mouth 2 (two) times daily. 30 tablet 5  . Omeprazole (PRILOSEC PO) Take 20 mg by mouth as needed (acid reflux).     . simvastatin (ZOCOR) 40 MG tablet  TAKE 1 TABLET EVERY DAY 90 tablet 1   No current facility-administered medications for this visit.     Past Medical History  Diagnosis Date  . VSD (ventricular septal defect)   . PVOD (pulmonary veno-occlusive disease)   . COPD (chronic obstructive pulmonary disease)   . Dyslipidemia   . Anxiety   . GERD (gastroesophageal reflux disease)   . Pericarditis   . Thoracic outlet syndrome   . HTN (hypertension) 02/22/2011  . Atrial tachycardia   . Syncope, near     Associated with atrial tachycardia-event recorder 1/16  . 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 CHF (congestive heart failure)   . Heart murmur   . Pneumonia "10 times" (01/23/2014)  . Chronic bronchitis     "1-2 times/yr" (01/23/2014)  . History of stomach ulcers   . Migraines     "stopped many years ago" (01/23/2014)  . Frequency of urination   . Stress incontinence     "was suppose to have been tacked up years ago but I didn't do it"    ROS:   All systems reviewed and negative except as noted in the HPI.   Past Surgical History  Procedure Laterality Date  . Vsd repair    . Cesarean section  1972  . Coronary angiogram  2000    No significant CAD  . Cholecystectomy open  1970's  . Tubal ligation  1972  . Cardiac catheterization  "quite a few"  . Myringotomy with tube placement Right 2015     Family History  Problem Relation Age of Onset  . Throat cancer    . Hypertension    . Stroke    . Alcohol abuse Other   . Arthritis Other   . Cancer Other     lung cancer  . Hypertension Other   . Arthritis Other   . Stroke Other   . Cancer Mother   . Cancer Father      History   Social History  . Marital Status: Married    Spouse Name: N/A  . Number of Children: N/A  . Years of Education: 12   Occupational History  . retired    Social History Main Topics  . Smoking status: Current Every Day Smoker -- 0.33 packs/day for 35 years    Types: Cigarettes  .  Smokeless tobacco: Never Used     Comment: Smoked 1971-present , up to 1/2 ppd. Intermittent smoking cessation with preganancy up to 1/2-1 year @ a time  . Alcohol Use: Yes     Comment: 01/23/2014 "glass of wine maybe once/month"  . Drug Use: No  . Sexual Activity: No   Other Topics Concern  . Not on file   Social History Narrative   Pt lives with husband.     BP 102/62 mmHg  Pulse 94  Ht 5\' 1"  (1.549 m)  Wt 123 lb 3.2 oz (55.883 kg)  BMI 23.29 kg/m2  Physical Exam:  Well appearing 69 yo woman, NAD HEENT: Unremarkable Neck:  No JVD, no thyromegally Lymphatics:  No adenopathy Back:  No CVA tenderness Lungs:  Clear with no wheezes HEART:  Regular rate rhythm, soft systolic murmur at the base, no rubs, no clicks Abd:  soft, positive bowel sounds, no organomegally, no rebound, no guarding Ext:  2 plus pulses, no edema, no cyanosis, no clubbing Skin:  No rashes no nodules Neuro:  CN II through XII intact, motor grossly intact  ECG - nSR with RBBB   Assess/Plan:

## 2014-06-14 NOTE — Progress Notes (Signed)
Site area: rt groin fv sheaths X 3 removed and pressure held by [facility] Site Prior to Removal:  Level  0 Pressure Applied For:  15 minutes Manual:   yes Patient Status During Pull:  stable Post Pull Site:  Level  0 Post Pull Instructions Given:  yes Post Pull Pulses Present: yes Dressing Applied:  tegaderm Bedrest begins @ 2224 Comments:  none

## 2014-06-15 ENCOUNTER — Encounter (HOSPITAL_COMMUNITY): Payer: Self-pay | Admitting: Internal Medicine

## 2014-06-15 DIAGNOSIS — G540 Brachial plexus disorders: Secondary | ICD-10-CM | POA: Diagnosis not present

## 2014-06-15 DIAGNOSIS — I471 Supraventricular tachycardia: Secondary | ICD-10-CM | POA: Diagnosis not present

## 2014-06-15 DIAGNOSIS — F17210 Nicotine dependence, cigarettes, uncomplicated: Secondary | ICD-10-CM | POA: Diagnosis not present

## 2014-06-15 DIAGNOSIS — J449 Chronic obstructive pulmonary disease, unspecified: Secondary | ICD-10-CM | POA: Diagnosis not present

## 2014-06-15 DIAGNOSIS — I4892 Unspecified atrial flutter: Secondary | ICD-10-CM | POA: Diagnosis not present

## 2014-06-15 MED ORDER — AMIODARONE HCL 400 MG PO TABS: ORAL_TABLET | ORAL | Status: DC

## 2014-06-15 MED FILL — Heparin Sodium (Porcine) 2 Unit/ML in Sodium Chloride 0.9%: INTRAMUSCULAR | Qty: 500 | Status: AC

## 2014-06-15 MED FILL — Lidocaine HCl Local Preservative Free (PF) Inj 1%: INTRAMUSCULAR | Qty: 30 | Status: AC

## 2014-06-15 NOTE — Discharge Summary (Signed)
ELECTROPHYSIOLOGY PROCEDURE DISCHARGE SUMMARY    Patient ID: Kimberly [name],  MRN: [id number], DOB/AGE: 02/28/1945 69 y.o.  Admit date: 06/14/2014 Discharge date: 06/15/2014  Primary Care Physician: Cathlean Cower, MD Primary Cardiologist: Harrington Challenger Electrophysiologist: Lovena Le  Primary Discharge Diagnosis:  AVNRT and atrial flutter status post ablation this admission  Secondary Discharge Diagnosis:  1.  VSD s/p repair 2.  COPD 3.  Thoracic outlet syndrome 4.  Hypertension  Allergies  Allergen Reactions  . Codeine     Nausea      Procedures This Admission:  1.  Electrophysiology study and radiofrequency catheter ablation on 06/14/14 by Dr Lovena Le. This study demonstrated sinus rhythm upon presentation; the patient had dual AV nodal physiology with easily inducible classic AV nodal reentrant tachycardia, non-sustained; inducible atrial flutter; spontaneous atrial fibrillation occuring during atrial flutter; successful radiofrequency modification of the slow AV nodal pathway and the atrial flutter isthmus; no inducible arrhythmias following ablation; no early apparent complications  Brief HPI: Kimberly [name] is a 69 y.o. female with a past medical history as outlined above. She was referred to EP in the outpatient setting for evaluation of wide QRS tachycardia in the setting of prior VSD repair and an EF of 40%.  EPS with possible ablation/ICD implant were recommended. Risks, benefits were reviewed with the patient who wished to proceed.  Hospital Course:  The patient was admitted and underwent EPS with inducible AVNRT and atrial flutter which were both successfully ablated.  AF was also induced during the procedure but only with rapid atrial pacing. She was monitored on telemetry overnight which demonstrated sinus rhythm with PAC's and PVC's.  Her groin and neck incisions were without complication. Because of inducible atrial fibrillation and multiple atrial arrhythmias, she will be  initiated on amiodarone for the short term.  She will be discharged on amiodarone 400mg  1 tablet daily for 2 weeks then amiodarone 200mg  daily. She will be followed up by Dr Lovena Le in 4 weeks.  Outpatient PFT's will be ordered   Physical Exam: Filed Vitals:   06/14/14 2000 06/14/14 2330 06/15/14 0026 06/15/14 0712  BP: 136/55 117/48  115/59  Pulse: 83 62  62  Temp: 98 F (36.7 C) 98.3 F (36.8 C)  97.7 F (36.5 C)  TempSrc: Oral Oral  Oral  Resp: 18 16  24   Height:      Weight:   121 lb 4.1 oz (55 kg)   SpO2: 91% 96%  95%    GEN- The patient is well appearing, alert and oriented x 3 today.   HEENT: normocephalic, atraumatic; sclera clear, conjunctiva pink; hearing intact; oropharynx clear; neck supple, no JVP Lungs- Clear to ausculation bilaterally, normal work of breathing.  No wheezes, rales, rhonchi Heart- Regular rate and rhythm, +systolic murmur GI- soft, non-tender, non-distended, bowel sounds present  Extremities- no clubbing, cyanosis, or edema; DP/PT/radial pulses 2+ bilaterally MS- no significant deformity or atrophy Skin- warm and dry, no rash or lesion Psych- euthymic mood, full affect Neuro- strength and sensation are intact    Labs:   Lab Results  Component Value Date   WBC 7.2 06/01/2014   HGB 13.6 06/01/2014   HCT 40.4 06/01/2014   MCV 88.1 06/01/2014   PLT 252.0 06/01/2014   No results for input(s): NA, K, CL, CO2, BUN, CREATININE, CALCIUM, PROT, BILITOT, ALKPHOS, ALT, AST, GLUCOSE in the last 168 hours.  Invalid input(s): LABALBU   Discharge Medications:    Medication List    TAKE these medications  ALPRAZolam 0.25 MG tablet  Commonly known as:  XANAX  Take 1 tablet (0.25 mg total) by mouth 2 (two) times daily as needed for anxiety.     amiodarone 400 MG tablet  Commonly known as:  PACERONE  Take 1 tablet (400mg ) daily for 2 weeks then take 1/2 tablet (200mg ) daily     aspirin 81 MG tablet  Take 81 mg by mouth daily.     BLADDER  CONTROL PAD REGULAR Misc     budesonide-formoterol 160-4.5 MCG/ACT inhaler  Commonly known as:  SYMBICORT  Inhale 2 puffs into the lungs 2 (two) times daily as needed (wheezing and sob).     chlorpheniramine-HYDROcodone 10-8 MG/5ML Lqcr  Commonly known as:  TUSSIONEX  Take 5 mLs by mouth daily as needed for cough.     HYDROcodone-homatropine 5-1.5 MG/5ML syrup  Commonly known as:  HYCODAN  Take 5 mLs by mouth every 6 (six) hours as needed for cough.     ibuprofen 200 MG tablet  Commonly known as:  ADVIL,MOTRIN  Take 200 mg by mouth daily as needed for headache.     lisinopril 2.5 MG tablet  Commonly known as:  PRINIVIL,ZESTRIL  Take 1 tablet (2.5 mg total) by mouth daily.     metoprolol tartrate 25 MG tablet  Commonly known as:  LOPRESSOR  Take 0.5 tablets (12.5 mg total) by mouth 2 (two) times daily.     PRILOSEC PO  Take 20 mg by mouth as needed (acid reflux).     simvastatin 40 MG tablet  Commonly known as:  ZOCOR  TAKE 1 TABLET EVERY DAY     VENTOLIN HFA 108 (90 BASE) MCG/ACT inhaler  Generic drug:  albuterol  Inhale 2 puffs into the lungs every 6 (six) hours as needed for wheezing or shortness of breath.        Disposition:  Discharge Instructions    Diet - low sodium heart healthy    Complete by:  As directed      Increase activity slowly    Complete by:  As directed           Follow-up Information    Follow up with Cristopher Peru, MD On 07/13/2014.   Specialty:  Cardiology   Why:  at 9:45AM   Contact information:   [address] [address] [phone]       Duration of Discharge Encounter: Greater than 30 minutes including physician time.  Signed, Chanetta Marshall, NP 06/15/2014 8:31 AM   EP Attending  Patient seen and examined. She is stable overnight with PVC's but no sustained arrhythmias. Agree with plan as outlined above. Usual followup.   Mikle Bosworth.D.

## 2014-06-15 NOTE — Discharge Instructions (Signed)
No driving for 4 days. No lifting over 5 lbs for 1 week. No sexual activity for 1 week. You may return to work in 1 week. Keep procedure site clean & dry. If you notice increased pain, swelling, bleeding or pus, call/return!  You may shower, but no soaking baths/hot tubs/pools for 1 week.  ° ° °

## 2014-06-22 ENCOUNTER — Ambulatory Visit: Payer: Commercial Managed Care - HMO

## 2014-06-28 ENCOUNTER — Ambulatory Visit (INDEPENDENT_AMBULATORY_CARE_PROVIDER_SITE_OTHER): Payer: Commercial Managed Care - HMO | Admitting: Internal Medicine

## 2014-06-28 ENCOUNTER — Encounter: Payer: Self-pay | Admitting: Internal Medicine

## 2014-06-28 VITALS — BP 130/82 | HR 62 | Temp 98.7°F | Wt 124.0 lb

## 2014-06-28 DIAGNOSIS — R05 Cough: Secondary | ICD-10-CM

## 2014-06-28 DIAGNOSIS — F419 Anxiety disorder, unspecified: Secondary | ICD-10-CM

## 2014-06-28 DIAGNOSIS — J449 Chronic obstructive pulmonary disease, unspecified: Secondary | ICD-10-CM

## 2014-06-28 DIAGNOSIS — J019 Acute sinusitis, unspecified: Secondary | ICD-10-CM

## 2014-06-28 DIAGNOSIS — R059 Cough, unspecified: Secondary | ICD-10-CM

## 2014-06-28 MED ORDER — LEVOFLOXACIN 250 MG PO TABS: 250.0000 mg | ORAL_TABLET | Freq: Every day | ORAL | Status: DC

## 2014-06-28 MED ORDER — ALPRAZOLAM 0.25 MG PO TABS: 0.2500 mg | ORAL_TABLET | Freq: Two times a day (BID) | ORAL | Status: DC | PRN

## 2014-06-28 MED ORDER — HYDROCOD POLST-CPM POLST ER 10-8 MG/5ML PO SUER: 5.0000 mL | Freq: Two times a day (BID) | ORAL | Status: DC | PRN

## 2014-06-28 NOTE — Patient Instructions (Signed)
Please take all new medication as prescribed - the antibiotic, and the tussionex  Please continue all other medications as before, and refills have been done if requested - the xanax  Please have the pharmacy call with any other refills you may need.  Please continue your efforts at being more active, low cholesterol diet, and weight control.  Please keep your appointments with your specialists as you may have planned

## 2014-06-28 NOTE — Progress Notes (Signed)
Pre visit review using our clinic review tool, if applicable. No additional management support is needed unless otherwise documented below in the visit note. 

## 2014-06-28 NOTE — Progress Notes (Signed)
Subjective:    Patient ID: Kimberly [name], female    DOB: July 04, 1945, 69 y.o.   MRN: [id number]  HPI   Here with 2-3 days acute onset fever, facial pain, pressure, headache, general weakness and malaise, and greenish d/c, with mild ST and cough, but pt denies chest pain, wheezing, increased sob or doe, orthopnea, PND, increased LE swelling, palpitations, dizziness or syncope.  Denies worsening depressive symptoms, suicidal ideation, or panic; has ongoing anxiety, not increased recently.   Pt denies new neurological symptoms such as new headache, or facial or extremity weakness or numbness Past Medical History  Diagnosis Date  . VSD (ventricular septal defect)   . PVOD (pulmonary veno-occlusive disease)   . COPD (chronic obstructive pulmonary disease)   . Dyslipidemia   . Anxiety   . GERD (gastroesophageal reflux disease)   . Pericarditis   . Thoracic outlet syndrome   . HTN (hypertension) 02/22/2011  . Atrial tachycardia   . Syncope, near     Associated with atrial tachycardia-event recorder 1/16  . 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 CHF (congestive heart failure)   . Heart murmur   . Chronic bronchitis     "1-2 times/yr" (01/23/2014)  . History of stomach ulcers   . Frequency of urination   . Stress incontinence     "was suppose to have been tacked up years ago but I didn't do it"  . Dysrhythmia   . Silent myocardial infarction "late 1990's"  . Pneumonia "10 times" (06/14/2014)  . Migraines     "stopped many years ago" (06/14/2014)  . Bursitis of shoulder, right, adhesive    Past Surgical History  Procedure Laterality Date  . Vsd repair  [id number]; [id number]  . Cesarean section  1972  . Coronary angiogram  2000    No significant CAD  . Cholecystectomy open  1970's  . Cardiac catheterization  "quite a few"  . Myringotomy with tube placement Right 2015  . Ventricular ablation surgery  06/14/2014  . Tubal ligation  1972  . Electrophysiologic study  N/A 06/14/2014    Procedure: A-Flutter/A-Tach/SVT Ablation;  Surgeon: Evans Lance, MD;  Location: [facility];  Service: Cardiovascular;  Laterality: N/A;    reports that she has been smoking Cigarettes.  She has a 11.55 pack-year smoking history. She has never used smokeless tobacco. She reports that she drinks alcohol. She reports that she does not use illicit drugs. family history includes Alcohol abuse in her other; Arthritis in her other and other; Cancer in her father, mother, and other; Hypertension in her other and another family member; Stroke in her other and another family member; Throat cancer in an other family member. Allergies  Allergen Reactions  . Codeine     Nausea    Current Outpatient Prescriptions on File Prior to Visit  Medication Sig Dispense Refill  . albuterol (VENTOLIN HFA) 108 (90 BASE) MCG/ACT inhaler Inhale 2 puffs into the lungs every 6 (six) hours as needed for wheezing or shortness of breath.     Marland Kitchen amiodarone (PACERONE) 400 MG tablet Take 1 tablet (400mg ) daily for 2 weeks then take 1/2 tablet (200mg ) daily 30 tablet 1  . aspirin 81 MG tablet Take 81 mg by mouth daily.      . budesonide-formoterol (SYMBICORT) 160-4.5 MCG/ACT inhaler Inhale 2 puffs into the lungs 2 (two) times daily as needed (wheezing and sob).    Marland Kitchen ibuprofen (ADVIL,MOTRIN) 200 MG tablet Take 200 mg  by mouth daily as needed for headache.     . Incontinence Supply Disposable (BLADDER CONTROL PAD REGULAR) MISC     . lisinopril (PRINIVIL,ZESTRIL) 2.5 MG tablet Take 1 tablet (2.5 mg total) by mouth daily. 90 tablet 3  . metoprolol tartrate (LOPRESSOR) 25 MG tablet Take 0.5 tablets (12.5 mg total) by mouth 2 (two) times daily. 30 tablet 5  . Omeprazole (PRILOSEC PO) Take 20 mg by mouth as needed (acid reflux).     . simvastatin (ZOCOR) 40 MG tablet TAKE 1 TABLET EVERY DAY 90 tablet 1   No current facility-administered medications on file prior to visit.   Review of Systems   Constitutional: Negative for unusual diaphoresis or night sweats HENT: Negative for ringing in ear or discharge Eyes: Negative for double vision or worsening visual disturbance.  Respiratory: Negative for choking and stridor.   Gastrointestinal: Negative for vomiting or other signifcant bowel change Genitourinary: Negative for hematuria or change in urine volume.  Musculoskeletal: Negative for other MSK pain or swelling Skin: Negative for color change and worsening wound.  Neurological: Negative for tremors and numbness other than noted  Psychiatric/Behavioral: Negative for decreased concentration or agitation other than above       Objective:   Physical Exam BP 130/82 mmHg  Pulse 62  Temp(Src) 98.7 F (37.1 C) (Oral)  Wt 124 lb (56.246 kg)  SpO2 94% VS noted, mild ill Constitutional: Pt appears in no significant distress HENT: Head: NCAT.  Right Ear: External ear normal.  Left Ear: External ear normal.  Eyes: . Pupils are equal, round, and reactive to light. Conjunctivae and EOM are normal Bilat tm's with mild erythema.  Max sinus areas mild tender.  Pharynx with mild erythema, no exudate Neck: Normal range of motion. Neck supple.  Cardiovascular: Normal rate and regular rhythm.   Pulmonary/Chest: Effort normal and breath sounds without rales or wheezing.  Neurological: Pt is alert. Not confused , motor grossly intact Skin: Skin is warm. No rash, no LE edema Psychiatric: Pt behavior is normal. No agitation.      Assessment & Plan:

## 2014-06-30 NOTE — Assessment & Plan Note (Signed)
Most likely related to URI, declines cxr,  to f/u any worsening symptoms or concerns

## 2014-06-30 NOTE — Assessment & Plan Note (Signed)
stable overall by history and exam, recent data reviewed with pt, and pt to continue medical treatment as before,  to f/u any worsening symptoms or concerns SpO2 Readings from Last 3 Encounters:  06/28/14 94%  06/15/14 95%  01/24/14 97%

## 2014-06-30 NOTE — Assessment & Plan Note (Signed)
stable overall by history and exam, and pt to continue medical treatment as before,  to f/u any worsening symptoms or concerns 

## 2014-06-30 NOTE — Assessment & Plan Note (Signed)
Mild to mod, for antibx course,  to f/u any worsening symptoms or concerns 

## 2014-07-13 ENCOUNTER — Encounter: Payer: Commercial Managed Care - HMO | Admitting: Internal Medicine

## 2014-07-20 ENCOUNTER — Encounter: Payer: Self-pay | Admitting: Internal Medicine

## 2014-07-20 ENCOUNTER — Ambulatory Visit (INDEPENDENT_AMBULATORY_CARE_PROVIDER_SITE_OTHER): Payer: Commercial Managed Care - HMO | Admitting: Internal Medicine

## 2014-07-20 VITALS — BP 148/60 | HR 53 | Ht 61.0 in | Wt 127.6 lb

## 2014-07-20 DIAGNOSIS — I471 Supraventricular tachycardia, unspecified: Secondary | ICD-10-CM

## 2014-07-20 DIAGNOSIS — Z720 Tobacco use: Secondary | ICD-10-CM | POA: Diagnosis not present

## 2014-07-20 DIAGNOSIS — F17200 Nicotine dependence, unspecified, uncomplicated: Secondary | ICD-10-CM

## 2014-07-20 DIAGNOSIS — I10 Essential (primary) hypertension: Secondary | ICD-10-CM

## 2014-07-20 NOTE — Progress Notes (Signed)
Kimberly [name] returns today for followup. She is a pleasant 69 yo woman with a h/o SVT and atrial flutter who underwent catheter ablation a month ago. During the procedure she had atrial fib of uncertain clinical significance. In the interim, she has been treated with amiodarone. She has had no recurrent SVT. She notes non-exertional chest pain, lasting less than a minute. No syncope. She has mild peripheral edema but admits to sodium indiscretion. Allergies  Allergen Reactions  . Codeine     Nausea      Current Outpatient Prescriptions  Medication Sig Dispense Refill  . albuterol (VENTOLIN HFA) 108 (90 BASE) MCG/ACT inhaler Inhale 2 puffs into the lungs every 6 (six) hours as needed for wheezing or shortness of breath.     . ALPRAZolam (XANAX) 0.25 MG tablet Take 1 tablet (0.25 mg total) by mouth 2 (two) times daily as needed for anxiety. 60 tablet 2  . amiodarone (PACERONE) 400 MG tablet Take 200 mg by mouth daily.    Marland Kitchen aspirin 81 MG tablet Take 81 mg by mouth daily.      . budesonide-formoterol (SYMBICORT) 160-4.5 MCG/ACT inhaler Inhale 2 puffs into the lungs 2 (two) times daily as needed (wheezing and sob).    . chlorpheniramine-HYDROcodone (TUSSIONEX PENNKINETIC ER) 10-8 MG/5ML SUER Take 5 mLs by mouth every 12 (twelve) hours as needed for cough. 180 mL 0  . ibuprofen (ADVIL,MOTRIN) 200 MG tablet Take 200 mg by mouth daily as needed for headache.     . Incontinence Supply Disposable (BLADDER CONTROL PAD REGULAR) MISC     . lisinopril (PRINIVIL,ZESTRIL) 2.5 MG tablet Take 1 tablet (2.5 mg total) by mouth daily. 90 tablet 3  . metoprolol tartrate (LOPRESSOR) 25 MG tablet Take 0.5 tablets (12.5 mg total) by mouth 2 (two) times daily. 30 tablet 5  . Omeprazole (PRILOSEC PO) Take 20 mg by mouth daily as needed (acid reflux).     . simvastatin (ZOCOR) 40 MG tablet TAKE 1 TABLET EVERY DAY (Patient taking differently: TAKE 1 TABLET BY MOUTH EVERY DAY) 90 tablet 1   No current  facility-administered medications for this visit.     Past Medical History  Diagnosis Date  . VSD (ventricular septal defect)   . PVOD (pulmonary veno-occlusive disease)   . COPD (chronic obstructive pulmonary disease)   . Dyslipidemia   . Anxiety   . GERD (gastroesophageal reflux disease)   . Pericarditis   . Thoracic outlet syndrome   . HTN (hypertension) 02/22/2011  . Atrial tachycardia   . Syncope, near     Associated with atrial tachycardia-event recorder 1/16  . 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 CHF (congestive heart failure)   . Heart murmur   . Chronic bronchitis     "1-2 times/yr" (01/23/2014)  . History of stomach ulcers   . Frequency of urination   . Stress incontinence     "was suppose to have been tacked up years ago but I didn't do it"  . Dysrhythmia   . Silent myocardial infarction "late 1990's"  . Pneumonia "10 times" (06/14/2014)  . Migraines     "stopped many years ago" (06/14/2014)  . Bursitis of shoulder, right, adhesive     ROS:   All systems reviewed and negative except as noted in the Kimberly.   Past Surgical History  Procedure Laterality Date  . Vsd repair  [id number]; [id number]  . Cesarean section  1972  . Coronary angiogram  2000    No significant CAD  . Cholecystectomy open  1970's  . Cardiac catheterization  "quite a few"  . Myringotomy with tube placement Right 2015  . Ventricular ablation surgery  06/14/2014  . Tubal ligation  1972  . Electrophysiologic study N/A 06/14/2014    Procedure: A-Flutter/A-Tach/SVT Ablation;  Surgeon: Evans Lance, MD;  Location: [facility];  Service: Cardiovascular;  Laterality: N/A;     Family History  Problem Relation Age of Onset  . Throat cancer    . Hypertension    . Stroke    . Alcohol abuse Other   . Arthritis Other   . Cancer Other     lung cancer  . Hypertension Other   . Arthritis Other   . Stroke Other   . Cancer Mother   . Cancer Father       History   Social History  . Marital Status: Married    Spouse Name: N/A  . Number of Children: N/A  . Years of Education: 12   Occupational History  . retired    Social History Main Topics  . Smoking status: Current Every Day Smoker -- 0.33 packs/day for 35 years    Types: Cigarettes  . Smokeless tobacco: Never Used     Comment: Smoked 1971-present , up to 1/2 ppd. Intermittent smoking cessation with preganancy up to 1/2-1 year @ a time  . Alcohol Use: Yes     Comment: 06/14/2014 "glass of wine maybe once/month"  . Drug Use: No  . Sexual Activity: No   Other Topics Concern  . Not on file   Social History Narrative   Pt lives with husband.     BP 148/60 mmHg  Pulse 53  Ht 5\' 1"  (1.549 m)  Wt 127 lb 9.6 oz (57.879 kg)  BMI 24.12 kg/m2  Physical Exam:  stable appearing 69 yo woman, NAD HEENT: Unremarkable Neck:  No JVD, no thyromegally Back:  No CVA tenderness Lungs:  Clear with rare wheezes but no increased work of breathing. HEART:  Regular rate rhythm, no murmurs, no rubs, no clicks Abd:  soft, positive bowel sounds, no organomegally, no rebound, no guarding Ext:  2 plus pulses, no edema, no cyanosis, no clubbing Skin:  No rashes no nodules Neuro:  CN II through XII intact, motor grossly intact  EKG - NSR with RBBB  Assess/Plan:

## 2014-07-20 NOTE — Assessment & Plan Note (Signed)
She is encouraged not to smoke.

## 2014-07-20 NOTE — Patient Instructions (Addendum)
Medication Instructions:  Your physician has recommended you make the following change in your medication:  1) Stop Amiodarone    Labwork: None ordered  Testing/Procedures: None ordered  Follow-Up: Your physician recommends that you schedule a follow-up appointment as needed   Any Other Special Instructions Will Be Listed Below (If Applicable).

## 2014-07-20 NOTE — Assessment & Plan Note (Signed)
Her blood pressure is slightly elevated today. She admits to sodium indiscretion. She will try to do better.

## 2014-07-20 NOTE — Assessment & Plan Note (Signed)
She has had no recurrent SVT. She will stop amiodarone and undergo watchful waiting.

## 2014-07-31 ENCOUNTER — Other Ambulatory Visit: Payer: Self-pay | Admitting: *Deleted

## 2014-07-31 MED ORDER — METOPROLOL TARTRATE 25 MG PO TABS: 12.5000 mg | ORAL_TABLET | Freq: Two times a day (BID) | ORAL | Status: DC

## 2014-10-16 ENCOUNTER — Telehealth: Payer: Self-pay | Admitting: Internal Medicine

## 2014-10-16 NOTE — Telephone Encounter (Signed)
Patient is requesting refill for chlorpheniramine-HYDROcodone Amanda Cockayne [facility]) 10-8 MG/5ML Latanya Presser [[phone] Her pharmacy is [place]

## 2014-10-17 MED ORDER — HYDROCOD POLST-CPM POLST ER 10-8 MG/5ML PO SUER: 5.0000 mL | Freq: Two times a day (BID) | ORAL | Status: DC | PRN

## 2014-10-17 NOTE — Telephone Encounter (Signed)
Done hardcopy to Dahlia  

## 2014-10-18 NOTE — Telephone Encounter (Signed)
Pt informed, Rx in cabinet for pt pick up  

## 2015-01-16 ENCOUNTER — Encounter: Payer: Self-pay | Admitting: Internal Medicine

## 2015-01-16 ENCOUNTER — Ambulatory Visit (INDEPENDENT_AMBULATORY_CARE_PROVIDER_SITE_OTHER): Payer: Commercial Managed Care - HMO | Admitting: Internal Medicine

## 2015-01-16 VITALS — BP 112/68 | HR 97 | Temp 98.4°F | Resp 20 | Ht 61.0 in | Wt 114.8 lb

## 2015-01-16 DIAGNOSIS — J441 Chronic obstructive pulmonary disease with (acute) exacerbation: Secondary | ICD-10-CM

## 2015-01-16 DIAGNOSIS — I429 Cardiomyopathy, unspecified: Secondary | ICD-10-CM | POA: Diagnosis not present

## 2015-01-16 DIAGNOSIS — J209 Acute bronchitis, unspecified: Secondary | ICD-10-CM | POA: Insufficient documentation

## 2015-01-16 MED ORDER — LEVOFLOXACIN 500 MG PO TABS: 500.0000 mg | ORAL_TABLET | Freq: Every day | ORAL | Status: DC

## 2015-01-16 MED ORDER — METHYLPREDNISOLONE ACETATE 40 MG/ML IJ SUSP
40.0000 mg | Freq: Once | INTRAMUSCULAR | Status: AC
  Administered 2015-01-16: 40 mg via INTRAMUSCULAR

## 2015-01-16 MED ORDER — HYDROCODONE-HOMATROPINE 5-1.5 MG/5ML PO SYRP: 5.0000 mL | ORAL_SOLUTION | Freq: Four times a day (QID) | ORAL | Status: DC | PRN

## 2015-01-16 NOTE — Progress Notes (Signed)
Subjective:  Patient ID: Kimberly [name], female    DOB: [birth year]  Age: 69 y.o. MRN: [id number]  CC: No chief complaint on file.   HPI HABIBAH RYEN presents for cough, wheezing and SOB. Cough is productive.  Outpatient Prescriptions Prior to Visit  Medication Sig Dispense Refill  . albuterol (VENTOLIN HFA) 108 (90 BASE) MCG/ACT inhaler Inhale 2 puffs into the lungs every 6 (six) hours as needed for wheezing or shortness of breath.     . ALPRAZolam (XANAX) 0.25 MG tablet Take 1 tablet (0.25 mg total) by mouth 2 (two) times daily as needed for anxiety. 60 tablet 2  . aspirin 81 MG tablet Take 81 mg by mouth daily.      . budesonide-formoterol (SYMBICORT) 160-4.5 MCG/ACT inhaler Inhale 2 puffs into the lungs 2 (two) times daily as needed (wheezing and sob).    Marland Kitchen ibuprofen (ADVIL,MOTRIN) 200 MG tablet Take 200 mg by mouth daily as needed for headache.     . Incontinence Supply Disposable (BLADDER CONTROL PAD REGULAR) MISC     . lisinopril (PRINIVIL,ZESTRIL) 2.5 MG tablet Take 1 tablet (2.5 mg total) by mouth daily. 90 tablet 3  . metoprolol tartrate (LOPRESSOR) 25 MG tablet Take 0.5 tablets (12.5 mg total) by mouth 2 (two) times daily. 30 tablet 10  . Omeprazole (PRILOSEC PO) Take 20 mg by mouth daily as needed (acid reflux).     . simvastatin (ZOCOR) 40 MG tablet TAKE 1 TABLET EVERY DAY (Patient taking differently: TAKE 1 TABLET BY MOUTH EVERY DAY) 90 tablet 1  . chlorpheniramine-HYDROcodone (TUSSIONEX PENNKINETIC ER) 10-8 MG/5ML SUER Take 5 mLs by mouth every 12 (twelve) hours as needed for cough. 180 mL 0   No facility-administered medications prior to visit.    ROS Review of Systems  Constitutional: Negative for chills, activity change, appetite change, fatigue and unexpected weight change.  HENT: Positive for congestion and sinus pressure. Negative for mouth sores.   Eyes: Negative for visual disturbance.  Respiratory: Positive for cough, shortness of breath and wheezing. Negative  for chest tightness.   Gastrointestinal: Negative for nausea and abdominal pain.  Genitourinary: Negative for frequency, difficulty urinating and vaginal pain.  Musculoskeletal: Negative for back pain and gait problem.  Skin: Negative for pallor and rash.  Neurological: Negative for dizziness, tremors, weakness, numbness and headaches.  Psychiatric/Behavioral: Negative for confusion and sleep disturbance.    Objective:  BP 112/68 mmHg  Pulse 97  Temp(Src) 98.4 F (36.9 C) (Oral)  Resp 20  Ht 5\' 1"  (1.549 m)  Wt 114 lb 12 oz (52.05 kg)  BMI 21.69 kg/m2  SpO2 96%  BP Readings from Last 3 Encounters:  01/16/15 112/68  07/20/14 148/60  06/28/14 130/82    Wt Readings from Last 3 Encounters:  01/16/15 114 lb 12 oz (52.05 kg)  07/20/14 127 lb 9.6 oz (57.879 kg)  06/28/14 124 lb (56.246 kg)    Physical Exam  Constitutional: She appears well-developed. No distress.  HENT:  Head: Normocephalic.  Right Ear: External ear normal.  Left Ear: External ear normal.  Nose: Nose normal.  Mouth/Throat: Oropharynx is clear and moist.  Eyes: Conjunctivae are normal. Pupils are equal, round, and reactive to light. Right eye exhibits no discharge. Left eye exhibits no discharge.  Neck: Normal range of motion. Neck supple. No JVD present. No tracheal deviation present. No thyromegaly present.  Cardiovascular: Normal rate, regular rhythm and normal heart sounds.   Pulmonary/Chest: No stridor. No respiratory distress. She has no  wheezes. She has no rales.  Abdominal: Soft. Bowel sounds are normal. She exhibits no distension and no mass. There is no tenderness. There is no rebound and no guarding.  Musculoskeletal: She exhibits no edema or tenderness.  Lymphadenopathy:    She has no cervical adenopathy.  Neurological: She displays normal reflexes. No cranial nerve deficit. She exhibits normal muscle tone. Coordination normal.  Skin: No rash noted. No erythema.  Psychiatric: She has a normal  mood and affect. Her behavior is normal. Judgment and thought content normal.    Lab Results  Component Value Date   WBC 7.2 06/01/2014   HGB 13.6 06/01/2014   HCT 40.4 06/01/2014   PLT 252.0 06/01/2014   GLUCOSE 73 06/01/2014   CHOL 207* 03/16/2013   TRIG 71.0 03/16/2013   HDL 77.40 03/16/2013   LDLDIRECT 112.6 03/16/2013   LDLCALC 82 11/18/2010   ALT 15 03/16/2013   AST 18 03/16/2013   NA 139 06/01/2014   K 3.7 06/01/2014   CL 102 06/01/2014   CREATININE 0.72 06/01/2014   BUN 9 06/01/2014   CO2 31 06/01/2014   TSH 0.94 06/01/2014   INR 1.2* 06/01/2014    No results found.  Assessment & Plan:   Diagnoses and all orders for this visit:  COPD exacerbation ([place])  Acute bronchitis, unspecified organism  Other orders -     HYDROcodone-homatropine (HYCODAN) 5-1.5 MG/5ML syrup; Take 5 mLs by mouth every 6 (six) hours as needed for cough. -     levofloxacin (LEVAQUIN) 500 MG tablet; Take 1 tablet (500 mg total) by mouth daily.  I have discontinued Ms. Hulon's chlorpheniramine-HYDROcodone. I am also having her start on HYDROcodone-homatropine and levofloxacin. Additionally, I am having her maintain her aspirin, albuterol, simvastatin, Omeprazole (PRILOSEC PO), BLADDER CONTROL PAD REGULAR, ibuprofen, lisinopril, budesonide-formoterol, ALPRAZolam, and metoprolol tartrate.  Meds ordered this encounter  Medications  . HYDROcodone-homatropine (HYCODAN) 5-1.5 MG/5ML syrup    Sig: Take 5 mLs by mouth every 6 (six) hours as needed for cough.    Dispense:  240 mL    Refill:  0  . levofloxacin (LEVAQUIN) 500 MG tablet    Sig: Take 1 tablet (500 mg total) by mouth daily.    Dispense:  10 tablet    Refill:  0     Follow-up: No Follow-up on file.  Walker Kehr, MD

## 2015-01-16 NOTE — Assessment & Plan Note (Signed)
Levaquin x 10 d 

## 2015-01-16 NOTE — Progress Notes (Signed)
Pre visit review using our clinic review tool, if applicable. No additional management support is needed unless otherwise documented below in the visit note. 

## 2015-01-16 NOTE — Assessment & Plan Note (Signed)
Depo-medrol 80 mg IM Use MDI

## 2015-01-16 NOTE — Assessment & Plan Note (Signed)
Smoking discussed 

## 2015-01-19 ENCOUNTER — Telehealth: Payer: Self-pay | Admitting: *Deleted

## 2015-01-19 MED ORDER — PREDNISONE 10 MG PO TABS: ORAL_TABLET | ORAL | Status: DC

## 2015-01-19 NOTE — Telephone Encounter (Signed)
Prednisone . Done erx

## 2015-01-19 NOTE — Telephone Encounter (Signed)
Notified pt md sent prednisone to pharmacy...Kimberly [name]

## 2015-01-19 NOTE — Telephone Encounter (Signed)
Received call  Pt states she saw Dr. Camila Li on Tues was dx with Bronchitis. Currently taking the Levaquin he rx but she states she is not any better at all. Requesting md to call in some prednisone...Johny Chess

## 2015-01-25 ENCOUNTER — Telehealth: Payer: Self-pay | Admitting: Internal Medicine

## 2015-01-25 NOTE — Telephone Encounter (Signed)
Pt advise and understood

## 2015-01-25 NOTE — Telephone Encounter (Signed)
Use over-the-counter  Flonase and "Afrin" nasal spray for nasal congestion as directed x4-5 d. Use" Delsym" or" Robitussin" cough syrup varietis for cough. Use Halls or Ricola cough drops.  Please, make an appointment if you are not better or if you're worse.

## 2015-01-25 NOTE — Telephone Encounter (Signed)
Pt was in last week and seen Dr. Camila Li with Bronchitis. She states her bronchitis is 99% better but the head cold just won't go away. She is still blowing and coughing up yellow. She's finished her antibiotic and has a couple days of prednisone left. She is wondering if there is anything else that can be called in or and OTC medication that she can take. She can be reached at [phone]. Pharmacy is [facility] in [place]

## 2015-01-29 ENCOUNTER — Ambulatory Visit: Payer: Commercial Managed Care - HMO | Admitting: Internal Medicine

## 2015-02-12 ENCOUNTER — Telehealth: Payer: Self-pay | Admitting: *Deleted

## 2015-02-12 ENCOUNTER — Telehealth: Payer: Self-pay | Admitting: Internal Medicine

## 2015-02-12 MED ORDER — LISINOPRIL 2.5 MG PO TABS: 2.5000 mg | ORAL_TABLET | Freq: Every day | ORAL | Status: DC

## 2015-02-12 NOTE — Telephone Encounter (Signed)
New message      *STAT* If patient is at the pharmacy, call can be transferred to refill team.   1. Which medications need to be refilled? (please list name of each medication and dose if known) lisinopril 2.5mg   2. Which pharmacy/location (including street and city if local pharmacy) is medication to be sent to? [facility] in [place] 3. Do they need a 30 day or 90 day supply?  90 day supply

## 2015-02-12 NOTE — Telephone Encounter (Signed)
Pt's Rx was sent to pt's pharmacy as requested. Confirmation received.  °

## 2015-02-12 NOTE — Telephone Encounter (Signed)
Left msg on triage requesting refill on her Xanax. MD is out of office today will return tomorrow will forward for his response tomorrow...Johny Chess

## 2015-02-13 MED ORDER — ALPRAZOLAM 0.25 MG PO TABS: 0.2500 mg | ORAL_TABLET | Freq: Two times a day (BID) | ORAL | Status: DC | PRN

## 2015-02-13 NOTE — Telephone Encounter (Signed)
Done hardcopy to [employer]

## 2015-02-13 NOTE — Telephone Encounter (Signed)
Notified pt rx fax to [facility]...Kimberly [name]

## 2015-02-13 NOTE — Telephone Encounter (Signed)
Forwarded to Triage.

## 2015-02-16 ENCOUNTER — Other Ambulatory Visit: Payer: Self-pay | Admitting: Internal Medicine

## 2015-02-16 DIAGNOSIS — Z1231 Encounter for screening mammogram for malignant neoplasm of breast: Secondary | ICD-10-CM

## 2015-02-23 ENCOUNTER — Other Ambulatory Visit: Payer: Self-pay | Admitting: Internal Medicine

## 2015-02-23 ENCOUNTER — Ambulatory Visit
Admission: RE | Admit: 2015-02-23 | Discharge: 2015-02-23 | Disposition: A | Discharge status: Home / Self Care | Payer: Medicare HMO | Source: Ambulatory Visit | Attending: Internal Medicine | Admitting: Internal Medicine

## 2015-02-23 DIAGNOSIS — Z1231 Encounter for screening mammogram for malignant neoplasm of breast: Secondary | ICD-10-CM

## 2015-02-27 ENCOUNTER — Encounter: Payer: Self-pay | Admitting: Internal Medicine

## 2015-02-27 ENCOUNTER — Ambulatory Visit (INDEPENDENT_AMBULATORY_CARE_PROVIDER_SITE_OTHER)
Admission: RE | Admit: 2015-02-27 | Discharge: 2015-02-27 | Disposition: A | Discharge status: Home / Self Care | Payer: Medicare HMO | Source: Ambulatory Visit | Attending: Internal Medicine | Admitting: Internal Medicine

## 2015-02-27 ENCOUNTER — Telehealth: Payer: Self-pay | Admitting: Internal Medicine

## 2015-02-27 ENCOUNTER — Ambulatory Visit (INDEPENDENT_AMBULATORY_CARE_PROVIDER_SITE_OTHER): Payer: Medicare HMO | Admitting: Internal Medicine

## 2015-02-27 ENCOUNTER — Other Ambulatory Visit (INDEPENDENT_AMBULATORY_CARE_PROVIDER_SITE_OTHER): Payer: Medicare HMO

## 2015-02-27 VITALS — BP 116/78 | HR 67 | Temp 98.8°F | Resp 20 | Ht 61.0 in | Wt 114.0 lb

## 2015-02-27 DIAGNOSIS — J209 Acute bronchitis, unspecified: Secondary | ICD-10-CM | POA: Diagnosis not present

## 2015-02-27 DIAGNOSIS — Z0000 Encounter for general adult medical examination without abnormal findings: Secondary | ICD-10-CM

## 2015-02-27 DIAGNOSIS — J441 Chronic obstructive pulmonary disease with (acute) exacerbation: Secondary | ICD-10-CM | POA: Diagnosis not present

## 2015-02-27 DIAGNOSIS — I10 Essential (primary) hypertension: Secondary | ICD-10-CM | POA: Diagnosis not present

## 2015-02-27 DIAGNOSIS — E785 Hyperlipidemia, unspecified: Secondary | ICD-10-CM | POA: Diagnosis not present

## 2015-02-27 DIAGNOSIS — Z0001 Encounter for general adult medical examination with abnormal findings: Secondary | ICD-10-CM

## 2015-02-27 LAB — LIPID PANEL
CHOL/HDL RATIO: 3
Cholesterol: 206 mg/dL — ABNORMAL HIGH (ref 0–200)
HDL: 73.3 mg/dL (ref >39.00)
LDL CALC: 113 mg/dL — AB (ref 0–99)
NONHDL: 133.19
TRIGLYCERIDES: 101 mg/dL (ref 0.0–149.0)
VLDL: 20.2 mg/dL (ref 0.0–40.0)

## 2015-02-27 LAB — HEPATIC FUNCTION PANEL
ALT: 10 U/L (ref 0–35)
AST: 14 U/L (ref 0–37)
Albumin: 4.1 g/dL (ref 3.5–5.2)
Alkaline Phosphatase: 96 U/L (ref 39–117)
BILIRUBIN DIRECT: 0.2 mg/dL (ref 0.0–0.3)
BILIRUBIN TOTAL: 0.7 mg/dL (ref 0.2–1.2)
TOTAL PROTEIN: 7.2 g/dL (ref 6.0–8.3)

## 2015-02-27 LAB — BASIC METABOLIC PANEL
BUN: 16 mg/dL (ref 6–23)
CO2: 37 mEq/L — ABNORMAL HIGH (ref 19–32)
Calcium: 9.7 mg/dL (ref 8.4–10.5)
Chloride: 100 mEq/L (ref 96–112)
Creatinine, Ser: 0.72 mg/dL (ref 0.40–1.20)
GFR: 85.61 mL/min (ref >60.00)
Glucose, Bld: 104 mg/dL — ABNORMAL HIGH (ref 70–99)
POTASSIUM: 4.1 meq/L (ref 3.5–5.1)
SODIUM: 140 meq/L (ref 135–145)

## 2015-02-27 LAB — URINALYSIS, ROUTINE W REFLEX MICROSCOPIC
BILIRUBIN URINE: NEGATIVE
KETONES UR: NEGATIVE
LEUKOCYTES UA: NEGATIVE
Nitrite: NEGATIVE
SPECIFIC GRAVITY, URINE: 1.025 (ref 1.000–1.030)
Total Protein, Urine: 30 — AB
UROBILINOGEN UA: 0.2 (ref 0.0–1.0)
Urine Glucose: NEGATIVE
WBC UA: NONE SEEN (ref 0–?)
pH: 6 (ref 5.0–8.0)

## 2015-02-27 LAB — CBC WITH DIFFERENTIAL/PLATELET
BASOS ABS: 0.1 10*3/uL (ref 0.0–0.1)
Basophils Relative: 1.2 % (ref 0.0–3.0)
EOS PCT: 0.8 % (ref 0.0–5.0)
Eosinophils Absolute: 0.1 10*3/uL (ref 0.0–0.7)
HEMATOCRIT: 46.8 % — AB (ref 36.0–46.0)
Hemoglobin: 15.3 g/dL — ABNORMAL HIGH (ref 12.0–15.0)
LYMPHS PCT: 28.5 % (ref 12.0–46.0)
Lymphs Abs: 2.5 10*3/uL (ref 0.7–4.0)
MCHC: 32.7 g/dL (ref 30.0–36.0)
MCV: 91.1 fl (ref 78.0–100.0)
MONOS PCT: 7 % (ref 3.0–12.0)
Monocytes Absolute: 0.6 10*3/uL (ref 0.1–1.0)
NEUTROS ABS: 5.5 10*3/uL (ref 1.4–7.7)
Neutrophils Relative %: 62.5 % (ref 43.0–77.0)
PLATELETS: 286 10*3/uL (ref 150.0–400.0)
RBC: 5.14 Mil/uL — ABNORMAL HIGH (ref 3.87–5.11)
RDW: 15.4 % (ref 11.5–15.5)
WBC: 8.8 10*3/uL (ref 4.0–10.5)

## 2015-02-27 LAB — TSH: TSH: 0.88 u[IU]/mL (ref 0.35–4.50)

## 2015-02-27 MED ORDER — HYDROCODONE-HOMATROPINE 5-1.5 MG/5ML PO SYRP: 5.0000 mL | ORAL_SOLUTION | Freq: Four times a day (QID) | ORAL | Status: DC | PRN

## 2015-02-27 MED ORDER — HYDROCOD POLST-CPM POLST ER 10-8 MG/5ML PO SUER: 5.0000 mL | Freq: Two times a day (BID) | ORAL | Status: DC | PRN

## 2015-02-27 MED ORDER — PREDNISONE 10 MG PO TABS: ORAL_TABLET | ORAL | Status: DC

## 2015-02-27 MED ORDER — METHYLPREDNISOLONE ACETATE 80 MG/ML IJ SUSP
80.0000 mg | Freq: Once | INTRAMUSCULAR | Status: AC
  Administered 2015-02-27: 80 mg via INTRAMUSCULAR

## 2015-02-27 MED ORDER — AZITHROMYCIN 250 MG PO TABS
ORAL_TABLET | ORAL | Status: DC
Start: 2015-02-27 — End: 2015-05-15

## 2015-02-27 NOTE — Patient Instructions (Signed)
You had the steroid shot today  Please take all new medication as prescribed  - the antibiotic, tussionex for cough as needed, and prednisone  Please continue all other medications as before, and refills have been done if requested.  Please have the pharmacy call with any other refills you may need.  Please continue your efforts at being more active, low cholesterol diet, and weight control.  You are otherwise up to date with prevention measures today.  Please keep your appointments with your specialists as you may have planned  You will be contacted regarding the referral for: colonoscopy  Please go to the XRAY Department in the Basement (go straight as you get off the elevator) for the x-ray testing  Please go to the LAB in the Basement (turn left off the elevator) for the tests to be done today  You will be contacted by phone if any changes need to be made immediately.  Otherwise, you will receive a letter about your results with an explanation, but please check with MyChart first.  Please remember to sign up for MyChart if you have not done so, as this will be important to you in the future with finding out test results, communicating by private email, and scheduling acute appointments online when needed.  Please return in 6 months, or sooner if needed

## 2015-02-27 NOTE — Assessment & Plan Note (Signed)
stable overall by history and exam, recent data reviewed with pt, and pt to continue medical treatment as before,  to f/u any worsening symptoms or concerns BP Readings from Last 3 Encounters:  02/27/15 116/78  01/16/15 112/68  07/20/14 148/60

## 2015-02-27 NOTE — Progress Notes (Signed)
Pre visit review using our clinic review tool, if applicable. No additional management support is needed unless otherwise documented below in the visit note. 

## 2015-02-27 NOTE — Telephone Encounter (Signed)
Pt was just in to see you and she got the impression that he was going to send a prescription for celebrex to [facility].

## 2015-02-27 NOTE — Assessment & Plan Note (Signed)

## 2015-02-27 NOTE — Assessment & Plan Note (Signed)
Prod cough c/w bronchitis vs pna, Mild to mod, for antibx course,  to f/u any worsening symptoms or concerns, also for cxr

## 2015-02-27 NOTE — Progress Notes (Signed)
Subjective:    Patient ID: Kimberly [name], female    DOB: 1945-07-04, 70 y.o.   MRN: [id number]  HPI  Here for wellness and f/u;  Overall doing ok;  Pt denies Chest pain, worsening SOB, DOE, wheezing, orthopnea, PND, worsening LE edema, palpitations, dizziness or syncope.  Pt denies neurological change such as new headache, facial or extremity weakness.  Pt denies polydipsia, polyuria, or low sugar symptoms. Pt states overall good compliance with treatment and medications, good tolerability, and has been trying to follow appropriate diet.  Pt denies worsening depressive symptoms, suicidal ideation or panic. No fever, night sweats, wt loss, loss of appetite, or other constitutional symptoms.  Pt states good ability with ADL's, has low fall risk, home safety reviewed and adequate, no other significant changes in hearing or vision, and only occasionally active with exercise.  Was seen dec 27 per Dr Alain Marion but did not feel hycodan worked as well as tussionex, and felt better for a while but "never really got over it" now with Here with acute onset mild to mod 2 wks worsening right ear pain (s/p ear tube) ST, HA, general weakness and malaise, with prod cough greenish sputum, incresaed sob and wheezing despite current inhalers., but Pt denies chest pain, orthopnea, PND, increased LE swelling, palpitations, dizziness or syncope. Wt Readings from Last 3 Encounters:  02/27/15 114 lb (51.71 kg)  01/16/15 114 lb 12 oz (52.05 kg)  07/20/14 127 lb 9.6 oz (57.879 kg)   Past Medical History  Diagnosis Date  . VSD (ventricular septal defect)   . PVOD (pulmonary veno-occlusive disease) ([place])   . COPD (chronic obstructive pulmonary disease) ([place])   . Dyslipidemia   . Anxiety   . GERD (gastroesophageal reflux disease)   . Pericarditis   . Thoracic outlet syndrome   . HTN (hypertension) 02/22/2011  . Atrial tachycardia ([place])   . Syncope, near     Associated with atrial tachycardia-event recorder 1/16  . Right  ventricular outflow tract premature ventricular contractions (PVCs)   . CHF (congestive heart failure) ([place])   . Heart murmur   . Chronic bronchitis ([place])     "1-2 times/yr" (01/23/2014)  . History of stomach ulcers   . Frequency of urination   . Stress incontinence     "was suppose to have been tacked up years ago but I didn't do it"  . Dysrhythmia   . Silent myocardial infarction ([place]) "late 1990's"  . Pneumonia "10 times" (06/14/2014)  . Migraines     "stopped many years ago" (06/14/2014)  . Bursitis of shoulder, right, adhesive    Past Surgical History  Procedure Laterality Date  . Vsd repair  [id number]; [id number]  . Cesarean section  1972  . Coronary angiogram  2000    No significant CAD  . Cholecystectomy open  1970's  . Cardiac catheterization  "quite a few"  . Myringotomy with tube placement Right 2015  . Ventricular ablation surgery  06/14/2014  . Tubal ligation  1972  . Electrophysiologic study N/A 06/14/2014    Procedure: A-Flutter/A-Tach/SVT Ablation;  Surgeon: Evans Lance, MD;  Location: [facility];  Service: Cardiovascular;  Laterality: N/A;    reports that she has been smoking Cigarettes.  She has a 11.55 pack-year smoking history. She has never used smokeless tobacco. She reports that she drinks alcohol. She reports that she does not use illicit drugs. family history includes Alcohol abuse in her other; Arthritis in her other and other; Cancer in  her father, mother, and other; Hypertension in her other; Stroke in her other. There is no history of Breast cancer. Allergies  Allergen Reactions  . Codeine     Nausea    Current Outpatient Prescriptions on File Prior to Visit  Medication Sig Dispense Refill  . albuterol (VENTOLIN HFA) 108 (90 BASE) MCG/ACT inhaler Inhale 2 puffs into the lungs every 6 (six) hours as needed for wheezing or shortness of breath.     . ALPRAZolam (XANAX) 0.25 MG tablet Take 1 tablet (0.25 mg total) by mouth 2 (two) times daily as needed for  anxiety. 60 tablet 2  . aspirin 81 MG tablet Take 81 mg by mouth daily.      . budesonide-formoterol (SYMBICORT) 160-4.5 MCG/ACT inhaler Inhale 2 puffs into the lungs 2 (two) times daily as needed (wheezing and sob).    Marland Kitchen HYDROcodone-homatropine (HYCODAN) 5-1.5 MG/5ML syrup Take 5 mLs by mouth every 6 (six) hours as needed for cough. 240 mL 0  . Incontinence Supply Disposable (BLADDER CONTROL PAD REGULAR) MISC     . lisinopril (PRINIVIL,ZESTRIL) 2.5 MG tablet Take 1 tablet (2.5 mg total) by mouth daily. 90 tablet 1  . metoprolol tartrate (LOPRESSOR) 25 MG tablet Take 0.5 tablets (12.5 mg total) by mouth 2 (two) times daily. 30 tablet 10  . Omeprazole (PRILOSEC PO) Take 20 mg by mouth daily as needed (acid reflux).     . predniSONE (DELTASONE) 10 MG tablet 3 tabs by mouth per day for 3 days,2tabs per day for 3 days,1tab per day for 3 days 18 tablet 0  . simvastatin (ZOCOR) 40 MG tablet TAKE 1 TABLET EVERY DAY (Patient taking differently: TAKE 1 TABLET BY MOUTH EVERY DAY) 90 tablet 1   No current facility-administered medications on file prior to visit.     Review of Systems Constitutional: Negative for increased diaphoresis, other activity, appetite or siginficant weight change other than noted HENT: Negative for worsening hearing loss, ear pain, facial swelling, mouth sores and neck stiffness.   Eyes: Negative for other worsening pain, redness or visual disturbance.  Respiratory: Negative for shortness of breath and wheezing  Cardiovascular: Negative for chest pain and palpitations.  Gastrointestinal: Negative for diarrhea, blood in stool, abdominal distention or other pain Genitourinary: Negative for hematuria, flank pain or change in urine volume.  Musculoskeletal: Negative for myalgias or other joint complaints.  Skin: Negative for color change and wound or drainage.  Neurological: Negative for syncope and numbness. other than noted Hematological: Negative for adenopathy. or other  swelling Psychiatric/Behavioral: Negative for hallucinations, SI, self-injury, decreased concentration or other worsening agitation.      Objective:   Physical Exam BP 116/78 mmHg  Pulse 67  Temp(Src) 98.8 F (37.1 C) (Oral)  Resp 20  Ht 5\' 1"  (1.549 m)  Wt 114 lb (51.71 kg)  BMI 21.55 kg/m2  SpO2 94% VS noted,  Constitutional: Pt is oriented to person, place, and time. Appears well-developed and well-nourished, in no significant distress Head: Normocephalic and atraumatic.  Right Ear: External ear normal.  Left Ear: External ear normal.  Nose: Nose normal.  Mouth/Throat: Oropharynx is clear and moist.  Eyes: Conjunctivae and EOM are normal. Pupils are equal, round, and reactive to light.  Neck: Normal range of motion. Neck supple. No JVD present. No tracheal deviation present or significant neck LA or mass Cardiovascular: Normal rate, regular rhythm, normal heart sounds and intact distal pulses.   Pulmonary/Chest: Effort normal and breath sounds decrezse with bilat rhonchi and wheezing  Abdominal: Soft. Bowel sounds are normal. NT. No HSM  Musculoskeletal: Normal range of motion. Exhibits no edema.  Lymphadenopathy:  Has no cervical adenopathy.  Neurological: Pt is alert and oriented to person, place, and time. Pt has normal reflexes. No cranial nerve deficit. Motor grossly intact Skin: Skin is warm and dry. No rash noted.  Psychiatric:  Has nervous mood and affect. Behavior is normal.     Assessment & Plan:

## 2015-02-27 NOTE — Assessment & Plan Note (Signed)
Mild to mod, for depomedrol IM, predpac asd, cough med prn,  to f/u any worsening symptoms or concerns 

## 2015-03-16 ENCOUNTER — Ambulatory Visit: Payer: Medicare HMO | Admitting: Internal Medicine

## 2015-03-30 ENCOUNTER — Ambulatory Visit (INDEPENDENT_AMBULATORY_CARE_PROVIDER_SITE_OTHER): Payer: Medicare HMO | Admitting: Internal Medicine

## 2015-03-30 ENCOUNTER — Encounter: Payer: Self-pay | Admitting: Internal Medicine

## 2015-03-30 VITALS — BP 111/66 | HR 56 | Ht 61.0 in | Wt 110.8 lb

## 2015-03-30 DIAGNOSIS — J439 Emphysema, unspecified: Secondary | ICD-10-CM

## 2015-03-30 DIAGNOSIS — R002 Palpitations: Secondary | ICD-10-CM | POA: Diagnosis not present

## 2015-03-30 DIAGNOSIS — I5022 Chronic systolic (congestive) heart failure: Secondary | ICD-10-CM | POA: Diagnosis not present

## 2015-03-30 DIAGNOSIS — I471 Supraventricular tachycardia: Secondary | ICD-10-CM

## 2015-03-30 MED ORDER — ROSUVASTATIN CALCIUM 10 MG PO TABS: 10.0000 mg | ORAL_TABLET | Freq: Every day | ORAL | Status: DC

## 2015-03-30 MED ORDER — PREDNISONE 10 MG PO TABS: ORAL_TABLET | ORAL | Status: DC

## 2015-03-30 NOTE — Progress Notes (Signed)
Cardiology Office Note   Date:  03/30/2015   ID:  Kimberly [name], Kimberly [name] Dec 25, 1945, MRN [id number]  PCP:  Cathlean Cower, MD  Cardiologist:   Dorris Carnes, MD    F/U of LV dysfunction    History of Present Illness:  Kimberly [name] is a 70 y.o. female with a history of congenital heart disease (s/p VSD repair), PVOD, dyslipidemia, COPD and tobacco use. \  Last echo in Dec 2015 LVEF was 40 to 45% No VSD  She is also st/p SVT ablation in May 2016.  During procedure had transient afib   Now off of amiodarone   Since she was seen she has done fair  Has occasional palptiations  Different from before  No dizziness Fairly short lived   Had bronchitis this winter   Was on 2 courses of prednisone  Got better Now feels like worse again  Cough is rel nonproductive      Outpatient Prescriptions Prior to Visit  Medication Sig Dispense Refill  . albuterol (VENTOLIN HFA) 108 (90 BASE) MCG/ACT inhaler Inhale 2 puffs into the lungs every 6 (six) hours as needed for wheezing or shortness of breath.     . ALPRAZolam (XANAX) 0.25 MG tablet Take 1 tablet (0.25 mg total) by mouth 2 (two) times daily as needed for anxiety. 60 tablet 2  . azithromycin (ZITHROMAX Z-PAK) 250 MG tablet Use as directed 6 tablet 1  . budesonide-formoterol (SYMBICORT) 160-4.5 MCG/ACT inhaler Inhale 2 puffs into the lungs 2 (two) times daily as needed (wheezing and sob).    . chlorpheniramine-HYDROcodone (TUSSIONEX PENNKINETIC ER) 10-8 MG/5ML SUER Take 5 mLs by mouth every 12 (twelve) hours as needed for cough. 180 mL 0  . Incontinence Supply Disposable (BLADDER CONTROL PAD REGULAR) MISC     . lisinopril (PRINIVIL,ZESTRIL) 2.5 MG tablet Take 1 tablet (2.5 mg total) by mouth daily. 90 tablet 1  . metoprolol tartrate (LOPRESSOR) 25 MG tablet Take 0.5 tablets (12.5 mg total) by mouth 2 (two) times daily. 30 tablet 10  . Omeprazole (PRILOSEC PO) Take 20 mg by mouth daily as needed (acid reflux).     . simvastatin (ZOCOR) 40 MG tablet TAKE 1  TABLET EVERY DAY (Patient taking differently: TAKE 1 TABLET BY MOUTH EVERY DAY) 90 tablet 1  . HYDROcodone-homatropine (HYCODAN) 5-1.5 MG/5ML syrup Take 5 mLs by mouth every 6 (six) hours as needed. 180 mL 0  . aspirin 81 MG tablet Take 81 mg by mouth daily. Reported on 03/30/2015    . predniSONE (DELTASONE) 10 MG tablet 3 tabs by mouth per day for 3 days,2tabs per day for 3 days,1tab per day for 3 days (Patient not taking: Reported on 03/30/2015) 18 tablet 0   No facility-administered medications prior to visit.     Allergies:   Codeine   Past Medical History  Diagnosis Date  . VSD (ventricular septal defect)   . PVOD (pulmonary veno-occlusive disease) ([place])   . COPD (chronic obstructive pulmonary disease) ([place])   . Dyslipidemia   . Anxiety   . GERD (gastroesophageal reflux disease)   . Pericarditis   . Thoracic outlet syndrome   . HTN (hypertension) 02/22/2011  . Atrial tachycardia ([place])   . Syncope, near     Associated with atrial tachycardia-event recorder 1/16  . 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 CHF (congestive heart failure) ([place])   . Heart murmur   . Chronic bronchitis ([place])     "1-2 times/yr" (01/23/2014)  .  History of stomach ulcers   . Frequency of urination   . Stress incontinence     "was suppose to have been tacked up years ago but I didn't do it"  . Dysrhythmia   . Silent myocardial infarction ([place]) "late 1990's"  . Pneumonia "10 times" (06/14/2014)  . Migraines     "stopped many years ago" (06/14/2014)  . Bursitis of shoulder, right, adhesive     Past Surgical History  Procedure Laterality Date  . Vsd repair  [id number]; [id number]  . Cesarean section  1972  . Coronary angiogram  2000    No significant CAD  . Cholecystectomy open  1970's  . Cardiac catheterization  "quite a few"  . Myringotomy with tube placement Right 2015  . Ventricular ablation surgery  06/14/2014  . Tubal ligation  1972  . Electrophysiologic study N/A  06/14/2014    Procedure: A-Flutter/A-Tach/SVT Ablation;  Surgeon: Evans Lance, MD;  Location: [facility];  Service: Cardiovascular;  Laterality: N/A;     Social History:  The patient  reports that she has been smoking Cigarettes.  She has a 11.55 pack-year smoking history. She has never used smokeless tobacco. She reports that she drinks alcohol. She reports that she does not use illicit drugs.   Family History:  The patient's family history includes Alcohol abuse in her other; Arthritis in her other and other; Cancer in her father, mother, and other; Hypertension in her other; Stroke in her other. There is no history of Breast cancer.    ROS:  Please see the history of present illness. All other systems are reviewed and  Negative to the above problem except as noted.    PHYSICAL EXAM: VS:  BP 111/66 mmHg  Pulse 56  Ht 5\' 1"  (1.549 m)  Wt 110 lb 12.8 oz (50.259 kg)  BMI 20.95 kg/m2  GEN: Thin 70 yo in no acute distress HEENT: normal Neck: no JVD, carotid bruits, or masses Cardiac: RRR; Gr III/VI systolic murmur LSB  No rubs, or gallops,no edema  Respiratory  Decreased airflow, more in the upper lung fields  SOme upper airway wheeze   GI: soft, nontender, nondistended, + BS  No hepatomegaly  MS: no deformity Moving all extremities   Skin: warm and dry, no rash Neuro:  Strength and sensation are intact Psych: euthymic mood, full affect   EKG:  EKG is not ordered today.   Lipid Panel    Component Value Date/Time   CHOL 206* 02/27/2015 1447   TRIG 101.0 02/27/2015 1447   HDL 73.30 02/27/2015 1447   CHOLHDL 3 02/27/2015 1447   VLDL 20.2 02/27/2015 1447   LDLCALC 113* 02/27/2015 1447   LDLDIRECT 112.6 03/16/2013 1057      Wt Readings from Last 3 Encounters:  03/30/15 110 lb 12.8 oz (50.259 kg)  02/27/15 114 lb (51.71 kg)  01/16/15 114 lb 12 oz (52.05 kg)      ASSESSMENT AND PLAN:  1  Chronic systolic CHF  Volume is OK    2  Rhythm  SVT ablation last  spring  Now with intermitt palpitations  Different form SVT in symptoms  If she has more spells I would recomm an event monitor  ? If this is afib   3.  HL  Pt not taking simvistatin  Does not like taking things at night  WIll rx Crestor  10 mg  Check lipds in 2 months    4  COPD  Lung exam is worse  Sat on RA 94  Airflow is down  Will give short course prednisone    Pt to back on on lopressor   Will follow  Lung exam is difficult       Signed, Dorris Carnes, MD  03/30/2015 3:24 PM    [facility] [address], [place], [place]  [address] Phone: [phone]; Fax: [phone]

## 2015-03-30 NOTE — Patient Instructions (Addendum)
Your physician has recommended you make the following change in your medication:  1.) stop Zocor 2.) start Crestor 10 mg once daily 3.) Metoprolol take 1/2 tablet daily for 1 week then STOP 4.) Prednisone 10 mg tablets: take 4 tabs for 3 days, then 3 tabs for 3 days, then 2 tabs for 3 days, then 1 tab for 3 days then STOP

## 2015-04-10 ENCOUNTER — Telehealth: Payer: Self-pay | Admitting: Internal Medicine

## 2015-04-10 DIAGNOSIS — R000 Tachycardia, unspecified: Secondary | ICD-10-CM

## 2015-04-10 NOTE — Telephone Encounter (Signed)
New message      Pt was seen recently. Pt is still having same issue with her breathing.  She will finish her prednisone tomorrow. Please advise

## 2015-04-10 NOTE — Telephone Encounter (Signed)
Patient called to report that her heart is racing still, like she has been feeling since before her OV on 3/10.  Today worse than yesterday. She is still congested and is continuing her steroid taper.  Tomorrow is last day of steroids.  She said Dr. Harrington Challenger may want her to wear a monitor because this is still going on. Adv pt the steroids will make her heart race as well, but she states this has been happening since before she started the steroids.  Pt is aware I am forwarding to Dr. Harrington Challenger for any further recommendations and will call her back/have scheduler call her.

## 2015-04-10 NOTE — Telephone Encounter (Signed)
Have pt set up for event monitor

## 2015-04-11 NOTE — Telephone Encounter (Signed)
Placed order for event monitor. Pt is aware someone will call her to schedule.

## 2015-04-18 ENCOUNTER — Telehealth: Payer: Self-pay | Admitting: Internal Medicine

## 2015-04-18 NOTE — Telephone Encounter (Signed)
ERROR DID NOT NEED TO OPEN

## 2015-04-26 ENCOUNTER — Ambulatory Visit (INDEPENDENT_AMBULATORY_CARE_PROVIDER_SITE_OTHER): Payer: Medicare HMO

## 2015-04-26 DIAGNOSIS — R000 Tachycardia, unspecified: Secondary | ICD-10-CM

## 2015-05-14 ENCOUNTER — Telehealth: Payer: Self-pay

## 2015-05-14 NOTE — Telephone Encounter (Signed)
   Patient said she is still waiting on refill of celebrex to [facility].

## 2015-05-15 MED ORDER — CELECOXIB 100 MG PO CAPS: 100.0000 mg | ORAL_CAPSULE | Freq: Two times a day (BID) | ORAL | Status: DC | PRN

## 2015-05-15 NOTE — Telephone Encounter (Signed)
Done erx 

## 2015-05-15 NOTE — Telephone Encounter (Signed)
Please advise I don't see this on patients medication list

## 2015-06-01 ENCOUNTER — Telehealth: Payer: Self-pay | Admitting: Internal Medicine

## 2015-06-01 NOTE — Telephone Encounter (Signed)
Gave patient the number to [facility]. She will call and get a return envelope

## 2015-06-01 NOTE — Telephone Encounter (Signed)
Follow-up    The pt is concerned about the charger cord the monitor box the pt wants to know where to mail the cord so the pt does not get charged extra.

## 2015-06-01 NOTE — Telephone Encounter (Signed)
Follow Up:; ° ° °Returning your call. °

## 2015-06-05 ENCOUNTER — Other Ambulatory Visit: Payer: Self-pay | Admitting: *Deleted

## 2015-06-05 MED ORDER — METOPROLOL TARTRATE 25 MG PO TABS: 12.5000 mg | ORAL_TABLET | Freq: Two times a day (BID) | ORAL | Status: DC

## 2015-06-14 ENCOUNTER — Ambulatory Visit (INDEPENDENT_AMBULATORY_CARE_PROVIDER_SITE_OTHER): Payer: Medicare HMO | Admitting: Internal Medicine

## 2015-06-14 ENCOUNTER — Encounter: Payer: Self-pay | Admitting: Internal Medicine

## 2015-06-14 VITALS — BP 130/68 | HR 54 | Temp 98.4°F | Resp 20 | Wt 117.0 lb

## 2015-06-14 DIAGNOSIS — R059 Cough, unspecified: Secondary | ICD-10-CM

## 2015-06-14 DIAGNOSIS — R05 Cough: Secondary | ICD-10-CM | POA: Diagnosis not present

## 2015-06-14 DIAGNOSIS — I10 Essential (primary) hypertension: Secondary | ICD-10-CM | POA: Diagnosis not present

## 2015-06-14 DIAGNOSIS — J441 Chronic obstructive pulmonary disease with (acute) exacerbation: Secondary | ICD-10-CM

## 2015-06-14 MED ORDER — LEVOFLOXACIN 500 MG PO TABS: 500.0000 mg | ORAL_TABLET | Freq: Every day | ORAL | Status: DC

## 2015-06-14 MED ORDER — METHYLPREDNISOLONE 4 MG PO TBPK: ORAL_TABLET | ORAL | Status: DC

## 2015-06-14 MED ORDER — HYDROCOD POLST-CPM POLST ER 10-8 MG/5ML PO SUER: 5.0000 mL | Freq: Two times a day (BID) | ORAL | Status: DC | PRN

## 2015-06-14 NOTE — Patient Instructions (Signed)
You had the steroid shot today  Please take all new medication as prescribed - the antibitoic, tussionex and medrol pack  Please continue all other medications as before, and refills have been done if requested.  Please have the pharmacy call with any other refills you may need.  Please keep your appointments with your specialists as you may have planned

## 2015-06-14 NOTE — Progress Notes (Signed)
Pre visit review using our clinic review tool, if applicable. No additional management support is needed unless otherwise documented below in the visit note. 

## 2015-06-14 NOTE — Assessment & Plan Note (Signed)
Mild to mod, for depomedrol, predpac asd, to f/u any worsening symptoms or concerns, cough med prn

## 2015-06-14 NOTE — Progress Notes (Signed)
Subjective:    Patient ID: Kimberly [name], female    DOB: Feb 22, 1945, 70 y.o.   MRN: [id number]  HPI  Here with acute onset mild to mod 2-3 days ST, HA, general weakness and malaise, with prod cough greenish sputum, but Pt denies chest pain, increased sob or doe, wheezing, orthopnea, PND, increased LE swelling, palpitations, dizziness or syncope, except for onset mild wheezing, cough for 1-2 days.  Tried to come in "early" this time, has hx of mult exacerbations in the past.  Pt denies new neurological symptoms such as new headache, or facial or extremity weakness or numbness  Pt denies polydipsia, polyuria.   Past Medical History  Diagnosis Date  . VSD (ventricular septal defect)   . PVOD (pulmonary veno-occlusive disease) ([place])   . COPD (chronic obstructive pulmonary disease) ([place])   . Dyslipidemia   . Anxiety   . GERD (gastroesophageal reflux disease)   . Pericarditis   . Thoracic outlet syndrome   . HTN (hypertension) 02/22/2011  . Atrial tachycardia ([place])   . Syncope, near     Associated with atrial tachycardia-event recorder 1/16  . 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 CHF (congestive heart failure) ([place])   . Heart murmur   . Chronic bronchitis ([place])     "1-2 times/yr" (01/23/2014)  . History of stomach ulcers   . Frequency of urination   . Stress incontinence     "was suppose to have been tacked up years ago but I didn't do it"  . Dysrhythmia   . Silent myocardial infarction ([place]) "late 1990's"  . Pneumonia "10 times" (06/14/2014)  . Migraines     "stopped many years ago" (06/14/2014)  . Bursitis of shoulder, right, adhesive    Past Surgical History  Procedure Laterality Date  . Vsd repair  [id number]; [id number]  . Cesarean section  1972  . Coronary angiogram  2000    No significant CAD  . Cholecystectomy open  1970's  . Cardiac catheterization  "quite a few"  . Myringotomy with tube placement Right 2015  . Ventricular ablation surgery   06/14/2014  . Tubal ligation  1972  . Electrophysiologic study N/A 06/14/2014    Procedure: A-Flutter/A-Tach/SVT Ablation;  Surgeon: Evans Lance, MD;  Location: [facility];  Service: Cardiovascular;  Laterality: N/A;    reports that she has been smoking Cigarettes.  She has a 11.55 pack-year smoking history. She has never used smokeless tobacco. She reports that she drinks alcohol. She reports that she does not use illicit drugs. family history includes Alcohol abuse in her other; Arthritis in her other and other; Cancer in her father, mother, and other; Hypertension in her other; Stroke in her other. There is no history of Breast cancer. Allergies  Allergen Reactions  . Codeine     Nausea    Current Outpatient Prescriptions on File Prior to Visit  Medication Sig Dispense Refill  . albuterol (VENTOLIN HFA) 108 (90 BASE) MCG/ACT inhaler Inhale 2 puffs into the lungs every 6 (six) hours as needed for wheezing or shortness of breath.     . ALPRAZolam (XANAX) 0.25 MG tablet Take 1 tablet (0.25 mg total) by mouth 2 (two) times daily as needed for anxiety. 60 tablet 2  . budesonide-formoterol (SYMBICORT) 160-4.5 MCG/ACT inhaler Inhale 2 puffs into the lungs 2 (two) times daily as needed (wheezing and sob).    . celecoxib (CELEBREX) 100 MG capsule Take 1 capsule (100 mg total) by  mouth 2 (two) times daily as needed. 60 capsule 5  . Incontinence Supply Disposable (BLADDER CONTROL PAD REGULAR) MISC     . lisinopril (PRINIVIL,ZESTRIL) 2.5 MG tablet Take 1 tablet (2.5 mg total) by mouth daily. 90 tablet 1  . metoprolol tartrate (LOPRESSOR) 25 MG tablet Take 0.5 tablets (12.5 mg total) by mouth 2 (two) times daily. 180 tablet 3  . Omeprazole (PRILOSEC PO) Take 20 mg by mouth daily as needed (acid reflux).     . predniSONE (DELTASONE) 10 MG tablet TAKE 4 TABS FOR 3 DAYS, THEN 3 TABS FOR 3 DAYS, THEN 2 TABS FOR 3 DAYS, THEN 1 TAB FOR 3 DAYS THEN STOP 30 tablet 0  . rosuvastatin (CRESTOR) 10 MG  tablet Take 1 tablet (10 mg total) by mouth daily. 90 tablet 3   No current facility-administered medications on file prior to visit.   Review of Systems  Constitutional: Negative for unusual diaphoresis or night sweats HENT: Negative for ear swelling or discharge Eyes: Negative for worsening visual haziness  Respiratory: Negative for choking and stridor.   Gastrointestinal: Negative for distension or worsening eructation Genitourinary: Negative for retention or change in urine volume.  Musculoskeletal: Negative for other MSK pain or swelling Skin: Negative for color change and worsening wound Neurological: Negative for tremors and numbness other than noted  Psychiatric/Behavioral: Negative for decreased concentration or agitation other than above       Objective:   Physical Exam BP 130/68 mmHg  Pulse 54  Temp(Src) 98.4 F (36.9 C) (Oral)  Resp 20  Wt 117 lb (53.071 kg)  SpO2 95% VS noted, mild ill Constitutional: Pt appears in no apparent distress HENT: Head: NCAT.  Right Ear: External ear normal.  Left Ear: External ear normal.  Bilat tm's with mild erythema.  Max sinus areas non tender.  Pharynx with mild erythema, no exudate Eyes: . Pupils are equal, round, and reactive to light. Conjunctivae and EOM are normal Neck: Normal range of motion. Neck supple.  Cardiovascular: Normal rate and regular rhythm.   Pulmonary/Chest: Effort normal and breath sounds decreased without rales but with bilat mild wheezing.  Neurological: Pt is alert. Not confused , motor grossly intact Skin: Skin is warm. No rash, no LE edema Psychiatric: Pt behavior is normal. No agitation.     Assessment & Plan:

## 2015-06-14 NOTE — Assessment & Plan Note (Signed)
Mild to mod, c/w bronchitis vs pna, declines cxr, for antibx course, to f/u any worsening symptoms or concerns  

## 2015-06-14 NOTE — Assessment & Plan Note (Signed)
stable overall by history and exam, recent data reviewed with pt, and pt to continue medical treatment as before,  to f/u any worsening symptoms or concerns BP Readings from Last 3 Encounters:  06/14/15 130/68  03/30/15 111/66  02/27/15 116/78

## 2015-08-16 ENCOUNTER — Other Ambulatory Visit: Payer: Self-pay | Admitting: Internal Medicine

## 2015-10-16 ENCOUNTER — Ambulatory Visit: Payer: Medicare HMO | Admitting: Internal Medicine

## 2015-10-17 ENCOUNTER — Ambulatory Visit (INDEPENDENT_AMBULATORY_CARE_PROVIDER_SITE_OTHER)
Admission: RE | Admit: 2015-10-17 | Discharge: 2015-10-17 | Disposition: A | Discharge status: Home / Self Care | Payer: Medicare HMO | Source: Ambulatory Visit | Attending: Internal Medicine | Admitting: Internal Medicine

## 2015-10-17 ENCOUNTER — Ambulatory Visit (INDEPENDENT_AMBULATORY_CARE_PROVIDER_SITE_OTHER): Payer: Medicare HMO | Admitting: Internal Medicine

## 2015-10-17 VITALS — BP 140/80 | HR 88 | Temp 98.3°F | Resp 20 | Wt 113.0 lb

## 2015-10-17 DIAGNOSIS — Z23 Encounter for immunization: Secondary | ICD-10-CM | POA: Diagnosis not present

## 2015-10-17 DIAGNOSIS — R059 Cough, unspecified: Secondary | ICD-10-CM

## 2015-10-17 DIAGNOSIS — R05 Cough: Secondary | ICD-10-CM | POA: Diagnosis not present

## 2015-10-17 DIAGNOSIS — I10 Essential (primary) hypertension: Secondary | ICD-10-CM | POA: Diagnosis not present

## 2015-10-17 DIAGNOSIS — J449 Chronic obstructive pulmonary disease, unspecified: Secondary | ICD-10-CM | POA: Diagnosis not present

## 2015-10-17 MED ORDER — HYDROCOD POLST-CPM POLST ER 10-8 MG/5ML PO SUER: 5.0000 mL | Freq: Two times a day (BID) | ORAL | 0 refills | Status: DC | PRN

## 2015-10-17 MED ORDER — FUROSEMIDE 20 MG PO TABS: 20.0000 mg | ORAL_TABLET | Freq: Every day | ORAL | 11 refills | Status: DC | PRN

## 2015-10-17 MED ORDER — ALBUTEROL SULFATE HFA 108 (90 BASE) MCG/ACT IN AERS: 2.0000 | INHALATION_SPRAY | Freq: Four times a day (QID) | RESPIRATORY_TRACT | 11 refills | Status: DC | PRN

## 2015-10-17 NOTE — Progress Notes (Signed)
Subjective:    Patient ID: Kimberly [name], female    DOB: 05-08-1945, 70 y.o.   MRN: [id number]  HPI  Here with 2-3 days onset "left lung problem" and indeed describes a type of mild worsening sob, scant prod cough and wheezing with some vague discomfort to the left lower post and lat chest/lung field.  Denies Fever, hemoptysis and just does not feel "ill" as she has with the prior 10 episodes of PNA.  Does ask for ventolin, tussionex and lasix refills.  Has done well without leg swelling recent, only takes prn.  Pt denies other chest pain, orthopnea, PND, increased LE swelling, palpitations, dizziness or syncope. Pt denies new neurological symptoms such as new headache, or facial or extremity weakness or numbness   Pt denies polydipsia, polyuria Past Medical History:  Diagnosis Date  . Anxiety   . Atrial tachycardia ([place])   . Bursitis of shoulder, right, adhesive   . CHF (congestive heart failure) ([place])   . Chronic bronchitis ([place])    "1-2 times/yr" (01/23/2014)  . COPD (chronic obstructive pulmonary disease) ([place])   . Dyslipidemia   . Dysrhythmia   . Frequency of urination   . GERD (gastroesophageal reflux disease)   . Heart murmur   . History of stomach ulcers   . HTN (hypertension) 02/22/2011  . Migraines    "stopped many years ago" (06/14/2014)  . Pericarditis   . Pneumonia "10 times" (06/14/2014)  . PVOD (pulmonary veno-occlusive disease) ([place])   . 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 Silent myocardial infarction ([place]) "late 1990's"  . Stress incontinence    "was suppose to have been tacked up years ago but I didn't do it"  . Syncope, near    Associated with atrial tachycardia-event recorder 1/16  . Thoracic outlet syndrome   . VSD (ventricular septal defect)    Past Surgical History:  Procedure Laterality Date  . CARDIAC CATHETERIZATION  "quite a few"  . [place]  . CHOLECYSTECTOMY OPEN  1970's  . CORONARY ANGIOGRAM  2000    No significant CAD  . ELECTROPHYSIOLOGIC STUDY N/A 06/14/2014   Procedure: A-Flutter/A-Tach/SVT Ablation;  Surgeon: Evans Lance, MD;  Location: [facility];  Service: Cardiovascular;  Laterality: N/A;  . MYRINGOTOMY WITH TUBE PLACEMENT Right 2015  . TUBAL LIGATION  1972  . VENTRICULAR ABLATION SURGERY  06/14/2014  . VSD REPAIR  1958; 1967    reports that she has been smoking Cigarettes.  She has a 11.55 pack-year smoking history. She has never used smokeless tobacco. She reports that she drinks alcohol. She reports that she does not use drugs. family history includes Alcohol abuse in her other; Arthritis in her other and other; Cancer in her father, mother, and other; Hypertension in her other; Stroke in her other. Allergies  Allergen Reactions  . Codeine     Nausea    Current Outpatient Prescriptions on File Prior to Visit  Medication Sig Dispense Refill  . ALPRAZolam (XANAX) 0.25 MG tablet Take 1 tablet (0.25 mg total) by mouth 2 (two) times daily as needed for anxiety. 60 tablet 2  . budesonide-formoterol (SYMBICORT) 160-4.5 MCG/ACT inhaler Inhale 2 puffs into the lungs 2 (two) times daily as needed (wheezing and sob).    . celecoxib (CELEBREX) 100 MG capsule Take 1 capsule (100 mg total) by mouth 2 (two) times daily as needed. 60 capsule 5  . lisinopril (PRINIVIL,ZESTRIL) 2.5 MG tablet TAKE 1 TABLET BY  MOUTH EVERY DAY. 90 tablet 2  . rosuvastatin (CRESTOR) 10 MG tablet Take 1 tablet (10 mg total) by mouth daily. 90 tablet 3   No current facility-administered medications on file prior to visit.    Review of Systems  Constitutional: Negative for unusual diaphoresis or night sweats HENT: Negative for ear swelling or discharge Eyes: Negative for worsening visual haziness  Respiratory: Negative for choking and stridor.   Gastrointestinal: Negative for distension or worsening eructation Genitourinary: Negative for retention or change in urine volume.  Musculoskeletal:  Negative for other MSK pain or swelling Skin: Negative for color change and worsening wound Neurological: Negative for tremors and numbness other than noted  Psychiatric/Behavioral: Negative for decreased concentration or agitation other than above       Objective:   Physical Exam BP 140/80   Pulse 88   Temp 98.3 F (36.8 C) (Oral)   Resp 20   Wt 113 lb (51.3 kg)   SpO2 98%   BMI 21.35 kg/m  VS noted,  Constitutional: Pt appears in no apparent distress HENT: Head: NCAT.  Right Ear: External ear normal.  Left Ear: External ear normal.  Eyes: . Pupils are equal, round, and reactive to light. Conjunctivae and EOM are normal Neck: Normal range of motion. Neck supple.  Cardiovascular: Normal rate and regular rhythm.   Pulmonary/Chest: Effort normal and breath sounds decreased bilat but is indeed worse left lower lung field,without rales but with an isolated wheezing.  Abd:  Soft, NT, ND, + BS Neurological: Pt is alert. Not confused , motor grossly intact Skin: Skin is warm. No rash, no LE edema Psychiatric: Pt behavior is normal. No agitation.     Assessment & Plan:

## 2015-10-17 NOTE — Progress Notes (Signed)
Pre visit review using our clinic review tool, if applicable. No additional management support is needed unless otherwise documented below in the visit note. 

## 2015-10-17 NOTE — Patient Instructions (Addendum)
You had the flu shot today  Please continue all other medications as before, and refills have been done if requested - the ventolin, tussionex and lasix.  Please have the pharmacy call with any other refills you may need.  Please continue your efforts at being more active, low cholesterol diet, and weight control.  Please keep your appointments with your specialists as you may have planned  Please go to the XRAY Department in the Basement (go straight as you get off the elevator) for the x-ray testing  You will be contacted by phone if any changes need to be made immediately.  Otherwise, you will receive a letter about your results with an explanation, but please check with MyChart first.  Please remember to sign up for MyChart if you have not done so, as this will be important to you in the future with finding out test results, communicating by private email, and scheduling acute appointments online when needed.

## 2015-10-19 NOTE — Assessment & Plan Note (Signed)
stable overall by history and exam, recent data reviewed with pt, and pt to continue medical treatment as before,  to f/u any worsening symptoms or concerns @LASTSAO2(3)@  

## 2015-10-19 NOTE — Assessment & Plan Note (Signed)
Mild to mod, c/w bornchitis vs pna, for antibx course, cough med prn, to f/u any worsening symptoms or concerns 

## 2015-10-19 NOTE — Assessment & Plan Note (Signed)
stable overall by history and exam, recent data reviewed with pt, and pt to continue medical treatment as before,  to f/u any worsening symptoms or concerns BP Readings from Last 3 Encounters:  10/17/15 140/80  06/14/15 130/68  03/30/15 111/66

## 2015-10-31 ENCOUNTER — Telehealth: Payer: Self-pay | Admitting: Internal Medicine

## 2015-10-31 NOTE — Telephone Encounter (Signed)
Patient reports dizzy spells over the last 2-3 weeks, almost fell on Saturday standing outside at ball game.  These are not the same spells she had before Dr. Lovena Le did her ablation.  Also reports swelling in hands, feet and ankles.  Has lasix 20 mg to take as needed, and is nervous to take every day.  Swelling is better in am, worse before bed at night.   No SOB, BP while on the phone with me is 120/79, HR 68.  Discussed lower sodium intake.  Also advised to drink more water, patient states she really does not drink enough at all.   Saw her PCP couple weeks ago for wheezing, did not discuss dizziness with PCP.  Appointment scheduled for Friday with APP.  Pt unable to come in tomorrow.

## 2015-10-31 NOTE — Telephone Encounter (Signed)
Pt c/o swelling: STAT is pt has developed SOB within 24 hours  1. How long have you been experiencing swelling? 2 weeks  2. Where is the swelling located? Feet., hands and ankles  3.  Are you currently taking a "fluid pill"? Lasix  4.  Are you currently SOB? No, dizziness x 3 weeks  5.  Have you traveled recently?no  [phone] or [phone]

## 2015-11-01 NOTE — Progress Notes (Signed)
Cardiology Office Note   Date:  11/02/2015   ID:  Kimberly [name] [birth year], MRN [id number]  PCP:  Cathlean Cower, MD  Cardiologist:  Dr. Harrington Challenger    Chief Complaint  Patient presents with  . Dizziness    rapid Heart rate      History of Present Illness: Kimberly [name] is a 70 y.o. female who presents for dizzy spells.   She has a history of congenital heart disease (s/p VSD repair), PVOD, dyslipidemia, COPD and tobacco use. \  Last echo in Dec 2015 LVEF was 40 to 45% No VSD  She is also st/p SVT ablation in May 2016.  During procedure had transient afib   Now off of amiodarone   Since she was seen she has done fair  Has occasional palptiations  Different from before  No dizziness Fairly short lived   Had bronchitis last winter   Was on 2 courses of prednisone  She was to begin metoprolol on last visit but did not wish to.   Last Weekend she was standing and had severe lightheadedness had to hold onto something so she would not fall or pass out.  Lasted a few minutes and resolved.  She was very stressed at the time. She had another episode with rapid heart rate that was not as severe.  One brief episodes this week.  No chest pain except what she describes as her usual.  No significant SOB.  She continues to smoke and would like to quit but she doesn't think she can.  Chantix made her very nauseated.    Past Medical History:  Diagnosis Date  . Anxiety   . Atrial tachycardia ([place])   . Bursitis of shoulder, right, adhesive   . CHF (congestive heart failure) ([place])   . Chronic bronchitis ([place])    "1-2 times/yr" (01/23/2014)  . COPD (chronic obstructive pulmonary disease) ([place])   . Dyslipidemia   . Dysrhythmia   . Frequency of urination   . GERD (gastroesophageal reflux disease)   . Heart murmur   . History of stomach ulcers   . HTN (hypertension) 02/22/2011  . Migraines    "stopped many years ago" (06/14/2014)  . Pericarditis   . Pneumonia "10 times" (06/14/2014)  . PVOD  (pulmonary veno-occlusive disease) ([place])   . 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 Silent myocardial infarction "late 1990's"  . Stress incontinence    "was suppose to have been tacked up years ago but I didn't do it"  . Syncope, near    Associated with atrial tachycardia-event recorder 1/16  . Thoracic outlet syndrome   . VSD (ventricular septal defect)     Past Surgical History:  Procedure Laterality Date  . CARDIAC CATHETERIZATION  "quite a few"  . [place]  . CHOLECYSTECTOMY OPEN  1970's  . CORONARY ANGIOGRAM  2000   No significant CAD  . ELECTROPHYSIOLOGIC STUDY N/A 06/14/2014   Procedure: A-Flutter/A-Tach/SVT Ablation;  Surgeon: Evans Lance, MD;  Location: [facility];  Service: Cardiovascular;  Laterality: N/A;  . MYRINGOTOMY WITH TUBE PLACEMENT Right 2015  . TUBAL LIGATION  1972  . VENTRICULAR ABLATION SURGERY  06/14/2014  . VSD REPAIR  1958; 1967     Current Outpatient Prescriptions  Medication Sig Dispense Refill  . albuterol (VENTOLIN HFA) 108 (90 Base) MCG/ACT inhaler Inhale 2 puffs into the lungs every 6 (six) hours as needed for wheezing or shortness of breath. [place]  g 11  . ALPRAZolam (XANAX) 0.25 MG tablet Take 1 tablet (0.25 mg total) by mouth 2 (two) times daily as needed for anxiety. 60 tablet 2  . budesonide-formoterol (SYMBICORT) 160-4.5 MCG/ACT inhaler Inhale 2 puffs into the lungs 2 (two) times daily as needed (wheezing and sob).    . celecoxib (CELEBREX) 100 MG capsule Take 1 capsule (100 mg total) by mouth 2 (two) times daily as needed. 60 capsule 5  . chlorpheniramine-HYDROcodone (TUSSIONEX PENNKINETIC ER) 10-8 MG/5ML SUER Take 5 mLs by mouth every 12 (twelve) hours as needed for cough. 180 mL 0  . furosemide (LASIX) 20 MG tablet Take 1 tablet (20 mg total) by mouth daily as needed. 30 tablet 11  . lisinopril (PRINIVIL,ZESTRIL) 2.5 MG tablet TAKE 1 TABLET BY MOUTH EVERY DAY. 90 tablet 2  .  rosuvastatin (CRESTOR) 10 MG tablet Take 1 tablet (10 mg total) by mouth daily. 90 tablet 3   No current facility-administered medications for this visit.     Allergies:   Codeine    Social History:  The patient  reports that she has been smoking Cigarettes.  She has a 11.55 pack-year smoking history. She has never used smokeless tobacco. She reports that she drinks alcohol. She reports that she does not use drugs.   Family History:  The patient's family history includes Alcohol abuse in her other; Arthritis in her other and other; Cancer in her father, mother, and other; Hypertension in her other; Stroke in her other.    ROS:  General:no colds or fevers, no weight changes Skin:no rashes or ulcers HEENT:no blurred vision, no congestion CV:see HPI PUL:see HPI GI:no diarrhea constipation or melena, no indigestion GU:no hematuria, no dysuria MS:no joint pain, no claudication Neuro:no syncope, no lightheadedness Endo:no diabetes, no thyroid disease  Wt Readings from Last 3 Encounters:  11/02/15 112 lb 6.4 oz (51 kg)  10/17/15 113 lb (51.3 kg)  06/14/15 117 lb (53.1 kg)     PHYSICAL EXAM: VS:  BP 112/70   Pulse 70   Ht 5\' 4"  (1.626 m)   Wt 112 lb 6.4 oz (51 kg)   BMI 19.29 kg/m  , BMI Body mass index is 19.29 kg/m. General:Pleasant affect, NAD Skin:Warm and dry, brisk capillary refill HEENT:normocephalic, sclera clear, mucus membranes moist Neck:supple, no JVD, no bruits  Heart:S1S2 RRR with 3/6 systolic murmur, no gallup, rub or click Lungs:diminished without rales, rhonchi, or wheezes [id number], non tender, + BS, do not palpate liver spleen or masses Ext:no lower ext edema, 2+ pedal pulses, 2+ radial pulses Neuro:alert and oriented X 3, MAE, follows commands, + facial symmetry    EKG:  EKG is ordered today. The ekg ordered today demonstrates SR with RBBB, T wave abnormality is old    Recent Labs: 02/27/2015: ALT 10; BUN 16; Creatinine, Ser 0.72; Hemoglobin 15.3;  Platelets 286.0; Potassium 4.1; Sodium 140; TSH 0.88    Lipid Panel    Component Value Date/Time   CHOL 206 (H) 02/27/2015 1447   TRIG 101.0 02/27/2015 1447   HDL 73.30 02/27/2015 1447   CHOLHDL 3 02/27/2015 1447   VLDL 20.2 02/27/2015 1447   LDLCALC 113 (H) 02/27/2015 1447   LDLDIRECT 112.6 03/16/2013 1057       Other studies Reviewed: Additional studies/ records that were reviewed today include: . 04/2015  Monitor Notes Recorded by Fay Records, MD on 06/01/2015 at 3:38 PM No atrial fibrillation on monitor  Just frequent isolated skips (PACs, PVCs)  ECHO 2015 Study Conclusions  -  Left ventricle: The cavity size was normal. Wall thickness was normal. Systolic function was mildly to moderately reduced. The estimated ejection fraction was in the range of 40% to 45%. There is akinesis of the anteroseptal myocardium. Doppler parameters are consistent with abnormal left ventricular relaxation (grade 1 diastolic dysfunction). - Mitral valve: Calcified annulus. Mildly thickened, mildly calcified leaflets . There was mild regurgitation. - Tricuspid valve: There was mild regurgitation directed eccentrically. - Pulmonary arteries: Systolic pressure was mildly increased. PA peak pressure: 31 mm Hg (S).  Impressions:  - Compared to the prior study, there has been no significant interval change. No VSD visualized.  Carotid dopplers 12/2013 Carotid ultrasound shows mild plaquing of internal carotid. No new recomm  ASSESSMENT AND PLAN:  1.  Dizziness with near syncope with tachycardia, this is different from her SVT symptoms.  She is not taking metoprolol Dr. Harrington Challenger recommended after previous monitor.  She does not like to take meds.  Will have her wear monitor again to see if we can capture arrhthymias.   Will check labs today as well.  Will see her back after monitor, or if SVT to Dr. Lovena Le.   2.  Hx SVT with ablation  3. Hx of VSD repair, last echo 2015. No  VSD visualized.    4. COPD followed by pulmonary  5. Tobacco use discussed stopping.   Current medicines are reviewed with the patient today.  The patient Has no concerns regarding medicines.  The following changes have been made:  See above Labs/ tests ordered today include:see above  Disposition:   FU:  see above  Signed, Cecilie Kicks, NP  11/02/2015 1:01 PM    [facility], [place], [place], [place] Phone: [phone]; Fax: [phone]

## 2015-11-02 ENCOUNTER — Ambulatory Visit (INDEPENDENT_AMBULATORY_CARE_PROVIDER_SITE_OTHER): Payer: Medicare HMO | Admitting: Cardiology

## 2015-11-02 ENCOUNTER — Encounter: Payer: Self-pay | Admitting: Cardiology

## 2015-11-02 VITALS — BP 112/70 | HR 70 | Ht 64.0 in | Wt 112.4 lb

## 2015-11-02 DIAGNOSIS — R000 Tachycardia, unspecified: Secondary | ICD-10-CM | POA: Diagnosis not present

## 2015-11-02 DIAGNOSIS — R55 Syncope and collapse: Secondary | ICD-10-CM

## 2015-11-02 DIAGNOSIS — F17200 Nicotine dependence, unspecified, uncomplicated: Secondary | ICD-10-CM

## 2015-11-02 DIAGNOSIS — Q210 Ventricular septal defect: Secondary | ICD-10-CM | POA: Diagnosis not present

## 2015-11-02 LAB — CBC
HEMATOCRIT: 42.5 % (ref 35.0–45.0)
Hemoglobin: 14.3 g/dL (ref 11.7–15.5)
MCH: 30 pg (ref 27.0–33.0)
MCHC: 33.6 g/dL (ref 32.0–36.0)
MCV: 89.1 fL (ref 80.0–100.0)
MPV: 9.7 fL (ref 7.5–12.5)
PLATELETS: 235 10*3/uL (ref 140–400)
RBC: 4.77 MIL/uL (ref 3.80–5.10)
RDW: 13.5 % (ref 11.0–15.0)
WBC: 6.9 10*3/uL (ref 3.8–10.8)

## 2015-11-02 LAB — COMPREHENSIVE METABOLIC PANEL
ALK PHOS: 78 U/L (ref 33–130)
ALT: 9 U/L (ref 6–29)
AST: 16 U/L (ref 10–35)
Albumin: 4 g/dL (ref 3.6–5.1)
BILIRUBIN TOTAL: 1 mg/dL (ref 0.2–1.2)
BUN: 13 mg/dL (ref 7–25)
CALCIUM: 9.3 mg/dL (ref 8.6–10.4)
CO2: 31 mmol/L (ref 20–31)
CREATININE: 0.81 mg/dL (ref 0.50–0.99)
Chloride: 102 mmol/L (ref 98–110)
GLUCOSE: 80 mg/dL (ref 65–99)
Potassium: 4.5 mmol/L (ref 3.5–5.3)
SODIUM: 138 mmol/L (ref 135–146)
Total Protein: 6.3 g/dL (ref 6.1–8.1)

## 2015-11-02 LAB — TSH: TSH: 0.73 mIU/L

## 2015-11-02 NOTE — Patient Instructions (Addendum)
Medication Instructions:  Your physician recommends that you continue on your current medications as directed. Please refer to the Current Medication list given to you today.  Labwork: Your physician recommends that you return for lab work in: TODAY-CBC, CMP, TSH  Testing/Procedures: Your physician has recommended that you wear an 30 day event monitor. Event monitors are medical devices that record the heart's electrical activity. Doctors most often [place] these monitors to diagnose arrhythmias. Arrhythmias are problems with the speed or rhythm of the heartbeat. The monitor is a small, portable device. You can wear one while you do your normal daily activities. This is usually used to diagnose what is causing palpitations/syncope (passing out).  Follow-Up: Your physician recommends that you schedule a follow-up appointment in: 1 month with Dr Harrington Challenger.   Any Other Special Instructions Will Be Listed Below (If Applicable).     If you need a refill on your cardiac medications before your next appointment, please call your pharmacy.

## 2015-11-05 NOTE — Progress Notes (Signed)
Pt aware of lab results 

## 2015-11-21 ENCOUNTER — Ambulatory Visit (INDEPENDENT_AMBULATORY_CARE_PROVIDER_SITE_OTHER): Payer: Medicare HMO

## 2015-11-21 DIAGNOSIS — R55 Syncope and collapse: Secondary | ICD-10-CM

## 2015-11-21 DIAGNOSIS — R000 Tachycardia, unspecified: Secondary | ICD-10-CM

## 2015-12-04 ENCOUNTER — Ambulatory Visit: Payer: Medicare HMO | Admitting: Internal Medicine

## 2015-12-06 ENCOUNTER — Telehealth: Payer: Self-pay | Admitting: Internal Medicine

## 2015-12-06 MED ORDER — ALPRAZOLAM 0.25 MG PO TABS: 0.2500 mg | ORAL_TABLET | Freq: Two times a day (BID) | ORAL | 2 refills | Status: DC | PRN

## 2015-12-06 NOTE — Telephone Encounter (Signed)
Patient called to advise that she is out iof her xanax and needs refill. Called a few days ago, but no message placed in the system. Pharmacy on file is correct

## 2015-12-06 NOTE — Telephone Encounter (Signed)
Done hardcopy to Corinne  

## 2015-12-06 NOTE — Telephone Encounter (Signed)
Gave info to pharmacy verbally, called patient to advise

## 2015-12-12 ENCOUNTER — Encounter: Payer: Self-pay | Admitting: Cardiology

## 2015-12-23 NOTE — Progress Notes (Deleted)
Cardiology Office Note   Date:  12/23/2015   ID:  Algie, Ngai Jan 21, 1946, MRN [id number]  PCP:  Cathlean Cower, MD  Cardiologist:  Dr. Harrington Challenger    No chief complaint on file.     History of Present Illness: Kimberly [name] is a 70 y.o. female who presents for ***  She has a history of congenital heart disease (s/p VSD repair), PVOD, dyslipidemia, COPD and tobacco use. \  Last echo in Dec 2015 LVEF was 40 to 45% No VSD  She is also st/p SVT ablation in May 2016. During procedure had transient afib Now off of amiodarone  Since she was seen she has done fair Has occasional palptiations Different from before No dizziness Fairly short lived  Had bronchitis last winter Was on 2 courses of prednisone She was to begin metoprolol on last visit but did not wish to.   Last Weekend she was standing and had severe lightheadedness had to hold onto something so she would not fall or pass out.  Lasted a few minutes and resolved.  She was very stressed at the time. She had another episode with rapid heart rate that was not as severe.  One brief episodes this week.  No chest pain except what she describes as her usual.  No significant SOB.  She continues to smoke and would like to quit but she doesn't think she can.  Chantix made her very nauseated.   Past Medical History:  Diagnosis Date  . Anxiety   . Atrial tachycardia ([place])   . Bursitis of shoulder, right, adhesive   . CHF (congestive heart failure) ([place])   . Chronic bronchitis ([place])    "1-2 times/yr" (01/23/2014)  . COPD (chronic obstructive pulmonary disease) ([place])   . Dyslipidemia   . Dysrhythmia   . Frequency of urination   . GERD (gastroesophageal reflux disease)   . Heart murmur   . History of stomach ulcers   . HTN (hypertension) 02/22/2011  . Migraines    "stopped many years ago" (06/14/2014)  . Pericarditis   . Pneumonia "10 times" (06/14/2014)  . PVOD (pulmonary veno-occlusive disease) ([place])   . Right ventricular  outflow tract premature ventricular contractions (PVCs)   . Silent myocardial infarction "late 1990's"  . Stress incontinence    "was suppose to have been tacked up years ago but I didn't do it"  . Syncope, near    Associated with atrial tachycardia-event recorder 1/16  . Thoracic outlet syndrome   . VSD (ventricular septal defect)     Past Surgical History:  Procedure Laterality Date  . CARDIAC CATHETERIZATION  "quite a few"  . [place]  . CHOLECYSTECTOMY OPEN  1970's  . CORONARY ANGIOGRAM  2000   No significant CAD  . ELECTROPHYSIOLOGIC STUDY N/A 06/14/2014   Procedure: A-Flutter/A-Tach/SVT Ablation;  Surgeon: Evans Lance, MD;  Location: [facility];  Service: Cardiovascular;  Laterality: N/A;  . MYRINGOTOMY WITH TUBE PLACEMENT Right 2015  . TUBAL LIGATION  1972  . VENTRICULAR ABLATION SURGERY  06/14/2014  . VSD REPAIR  1958; 1967     Current Outpatient Prescriptions  Medication Sig Dispense Refill  . albuterol (VENTOLIN HFA) 108 (90 Base) MCG/ACT inhaler Inhale 2 puffs into the lungs every 6 (six) hours as needed for wheezing or shortness of breath. 18 g 11  . ALPRAZolam (XANAX) 0.25 MG tablet Take 1 tablet (0.25 mg total) by mouth 2 (two) times daily as needed for anxiety.  60 tablet 2  . budesonide-formoterol (SYMBICORT) 160-4.5 MCG/ACT inhaler Inhale 2 puffs into the lungs 2 (two) times daily as needed (wheezing and sob).    . celecoxib (CELEBREX) 100 MG capsule Take 1 capsule (100 mg total) by mouth 2 (two) times daily as needed. 60 capsule 5  . chlorpheniramine-HYDROcodone (TUSSIONEX PENNKINETIC ER) 10-8 MG/5ML SUER Take 5 mLs by mouth every 12 (twelve) hours as needed for cough. 180 mL 0  . furosemide (LASIX) 20 MG tablet Take 1 tablet (20 mg total) by mouth daily as needed. 30 tablet 11  . lisinopril (PRINIVIL,ZESTRIL) 2.5 MG tablet TAKE 1 TABLET BY MOUTH EVERY DAY. 90 tablet 2  . rosuvastatin (CRESTOR) 10 MG tablet Take 1 tablet (10 mg total) by  mouth daily. 90 tablet 3   No current facility-administered medications for this visit.     Allergies:   Codeine    Social History:  The patient  reports that she has been smoking Cigarettes.  She has a 11.55 pack-year smoking history. She has never used smokeless tobacco. She reports that she drinks alcohol. She reports that she does not use drugs.   Family History:  The patient's ***family history includes Alcohol abuse in her other; Arthritis in her other and other; Cancer in her father, mother, and other; Hypertension in her other; Stroke in her other.    ROS:  General:no colds or fevers, no weight changes Skin:no rashes or ulcers HEENT:no blurred vision, no congestion CV:see HPI PUL:see HPI GI:no diarrhea constipation or melena, no indigestion GU:no hematuria, no dysuria MS:no joint pain, no claudication Neuro:no syncope, no lightheadedness Endo:no diabetes, no thyroid disease Wt Readings from Last 3 Encounters:  11/02/15 112 lb 6.4 oz (51 kg)  10/17/15 113 lb (51.3 kg)  06/14/15 117 lb (53.1 kg)     PHYSICAL EXAM: VS:  There were no vitals taken for this visit. , BMI There is no height or weight on file to calculate BMI. General:Pleasant affect, NAD Skin:Warm and dry, brisk capillary refill HEENT:normocephalic, sclera clear, mucus membranes moist Neck:supple, no JVD, no bruits  Heart:S1S2 RRR without murmur, gallup, rub or click Lungs:clear without rales, rhonchi, or wheezes [id number], non tender, + BS, do not palpate liver spleen or masses Ext:no lower ext edema, 2+ pedal pulses, 2+ radial pulses Neuro:alert and oriented, MAE, follows commands, + facial symmetry    EKG:  EKG is ordered today. The ekg ordered today demonstrates ***   Recent Labs: 11/02/2015: ALT 9; BUN 13; Creat 0.81; Hemoglobin 14.3; Platelets 235; Potassium 4.5; Sodium 138; TSH 0.73    Lipid Panel    Component Value Date/Time   CHOL 206 (H) 02/27/2015 1447   TRIG 101.0 02/27/2015 1447    HDL 73.30 02/27/2015 1447   CHOLHDL 3 02/27/2015 1447   VLDL 20.2 02/27/2015 1447   LDLCALC 113 (H) 02/27/2015 1447   LDLDIRECT 112.6 03/16/2013 1057       Other studies Reviewed: Additional studies/ records that were reviewed today include: ***.   ASSESSMENT AND PLAN:  1.  *** 1.  Dizziness with near syncope with tachycardia, this is different from her SVT symptoms.  She is not taking metoprolol Dr. Harrington Challenger recommended after previous monitor.  She does not like to take meds.  Will have her wear monitor again to see if we can capture arrhthymias.   Will check labs today as well.  Will see her back after monitor, or if SVT to Dr. Lovena Le.   2.  Hx SVT with ablation  3. Hx of VSD repair, last echo 2015. No VSD visualized.    4. COPD followed by pulmonary  5. Tobacco use discussed stopping  Current medicines are reviewed with the patient today.  The patient Has no concerns regarding medicines.  The following changes have been made:  See above Labs/ tests ordered today include:see above  Disposition:   FU:  see above  Signed, Cecilie Kicks, NP  12/23/2015 10:02 PM    [facility], [place], [place], [place] Phone: [phone]; Fax: [phone]

## 2015-12-24 ENCOUNTER — Ambulatory Visit: Payer: Medicare HMO | Admitting: Cardiology

## 2016-01-02 ENCOUNTER — Ambulatory Visit: Payer: Medicare HMO | Admitting: Internal Medicine

## 2016-01-03 ENCOUNTER — Ambulatory Visit (INDEPENDENT_AMBULATORY_CARE_PROVIDER_SITE_OTHER): Payer: Medicare HMO | Admitting: Internal Medicine

## 2016-01-03 ENCOUNTER — Encounter: Payer: Self-pay | Admitting: Internal Medicine

## 2016-01-03 VITALS — BP 140/80 | HR 90 | Temp 97.9°F | Resp 20 | Wt 108.0 lb

## 2016-01-03 DIAGNOSIS — Z0001 Encounter for general adult medical examination with abnormal findings: Secondary | ICD-10-CM

## 2016-01-03 DIAGNOSIS — J449 Chronic obstructive pulmonary disease, unspecified: Secondary | ICD-10-CM

## 2016-01-03 DIAGNOSIS — F419 Anxiety disorder, unspecified: Secondary | ICD-10-CM

## 2016-01-03 DIAGNOSIS — F32A Depression, unspecified: Secondary | ICD-10-CM

## 2016-01-03 DIAGNOSIS — J019 Acute sinusitis, unspecified: Secondary | ICD-10-CM

## 2016-01-03 DIAGNOSIS — F329 Major depressive disorder, single episode, unspecified: Secondary | ICD-10-CM | POA: Diagnosis not present

## 2016-01-03 MED ORDER — LEVOFLOXACIN 250 MG PO TABS: 250.0000 mg | ORAL_TABLET | Freq: Every day | ORAL | 0 refills | Status: AC

## 2016-01-03 MED ORDER — HYDROCOD POLST-CPM POLST ER 10-8 MG/5ML PO SUER: 5.0000 mL | Freq: Two times a day (BID) | ORAL | 0 refills | Status: DC | PRN

## 2016-01-03 MED ORDER — CITALOPRAM HYDROBROMIDE 20 MG PO TABS: 20.0000 mg | ORAL_TABLET | Freq: Every day | ORAL | 3 refills | Status: DC

## 2016-01-03 NOTE — Progress Notes (Signed)
Pre visit review using our clinic review tool, if applicable. No additional management support is needed unless otherwise documented below in the visit note. 

## 2016-01-03 NOTE — Progress Notes (Signed)
Subjective:    Patient ID: [name], female    DOB: [birth year], 70 y.o.   MRN: [id number]  HPI   Here with 2-3 days acute onset fever, facial pain, pressure, headache, general weakness and malaise, and greenish d/c, with mild ST and cough, but pt denies chest pain, wheezing, increased sob or doe, orthopnea, PND, increased LE swelling, palpitations, dizziness or syncope. Lost wt recently due to mult stressors assoc with husband recent illness last wk and having to admin his IV antibx at home and other care.  Has also mild to mod worsening depressive symptoms in the last month, but no suicidal ideation, or panic; has ongoing anxiety, not increased recently.  Wt Readings from Last 3 Encounters:  01/03/16 108 lb (49 kg)  11/02/15 112 lb 6.4 oz (51 kg)  10/17/15 113 lb (51.3 kg)   Past Medical History:  Diagnosis Date  . Anxiety   . Atrial tachycardia ([place])   . Bursitis of shoulder, right, adhesive   . CHF (congestive heart failure) ([place])   . Chronic bronchitis ([place])    "1-2 times/yr" (01/23/2014)  . COPD (chronic obstructive pulmonary disease) ([place])   . Dyslipidemia   . Dysrhythmia   . Frequency of urination   . GERD (gastroesophageal reflux disease)   . Heart murmur   . History of stomach ulcers   . HTN (hypertension) 02/22/2011  . Migraines    "stopped many years ago" (06/14/2014)  . Pericarditis   . Pneumonia "10 times" (06/14/2014)  . PVOD (pulmonary veno-occlusive disease) ([place])   . 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 Silent myocardial infarction "late 1990's"  . Stress incontinence    "was suppose to have been tacked up years ago but I didn't do it"  . Syncope, near    Associated with atrial tachycardia-event recorder 1/16  . Thoracic outlet syndrome   . VSD (ventricular septal defect)    Past Surgical History:  Procedure Laterality Date  . CARDIAC CATHETERIZATION  "quite a few"  . [place]  . CHOLECYSTECTOMY OPEN   1970's  . CORONARY ANGIOGRAM  2000   No significant CAD  . ELECTROPHYSIOLOGIC STUDY N/A 06/14/2014   Procedure: A-Flutter/A-Tach/SVT Ablation;  Surgeon: Evans Lance, MD;  Location: [facility];  Service: Cardiovascular;  Laterality: N/A;  . MYRINGOTOMY WITH TUBE PLACEMENT Right 2015  . TUBAL LIGATION  1972  . VENTRICULAR ABLATION SURGERY  06/14/2014  . VSD REPAIR  1958; 1967    reports that she has been smoking Cigarettes.  She has a 11.55 pack-year smoking history. She has never used smokeless tobacco. She reports that she drinks alcohol. She reports that she does not use drugs. family history includes Alcohol abuse in her other; Arthritis in her other and other; Cancer in her father, mother, and other; Hypertension in her other; Stroke in her other. Allergies  Allergen Reactions  . Codeine     Nausea    Current Outpatient Prescriptions on File Prior to Visit  Medication Sig Dispense Refill  . albuterol (VENTOLIN HFA) 108 (90 Base) MCG/ACT inhaler Inhale 2 puffs into the lungs every 6 (six) hours as needed for wheezing or shortness of breath. 18 g 11  . ALPRAZolam (XANAX) 0.25 MG tablet Take 1 tablet (0.25 mg total) by mouth 2 (two) times daily as needed for anxiety. 60 tablet 2  . budesonide-formoterol (SYMBICORT) 160-4.5 MCG/ACT inhaler Inhale 2 puffs into the lungs 2 (two) times daily as  needed (wheezing and sob).    . celecoxib (CELEBREX) 100 MG capsule Take 1 capsule (100 mg total) by mouth 2 (two) times daily as needed. 60 capsule 5  . furosemide (LASIX) 20 MG tablet Take 1 tablet (20 mg total) by mouth daily as needed. 30 tablet 11  . lisinopril (PRINIVIL,ZESTRIL) 2.5 MG tablet TAKE 1 TABLET BY MOUTH EVERY DAY. 90 tablet 2  . rosuvastatin (CRESTOR) 10 MG tablet Take 1 tablet (10 mg total) by mouth daily. 90 tablet 3   No current facility-administered medications on file prior to visit.    Review of Systems  Constitutional: Negative for unusual diaphoresis or night  sweats HENT: Negative for ear swelling or discharge Eyes: Negative for worsening visual haziness  Respiratory: Negative for choking and stridor.   Gastrointestinal: Negative for distension or worsening eructation Genitourinary: Negative for retention or change in urine volume.  Musculoskeletal: Negative for other MSK pain or swelling Skin: Negative for color change and worsening wound Neurological: Negative for tremors and numbness other than noted  Psychiatric/Behavioral: Negative for decreased concentration or agitation other than above   All other system neg per pt    Objective:   Physical Exam BP 140/80   Pulse 90   Temp 97.9 F (36.6 C) (Oral)   Resp 20   Wt 108 lb (49 kg)   SpO2 92%   BMI 18.54 kg/m  VS noted,  Constitutional: Pt appears in no apparent distress HENT: Head: NCAT.  Right Ear: External ear normal.  Left Ear: External ear normal.  Eyes: . Pupils are equal, round, and reactive to light. Conjunctivae and EOM are normal Neck: Normal range of motion. Neck supple.  Cardiovascular: Normal rate and regular rhythm.   Pulmonary/Chest: Effort normal and breath sounds decreased without rales or wheezing.  Abd:  Soft, NT, ND, + BS Neurological: Pt is alert. Not confused , motor grossly intact Skin: Skin is warm. No rash, no LE edema Psychiatric: Pt behavior is normal. No agitation. 1-2+ nervous, depressed affect No other new exam findings    Assessment & Plan:

## 2016-01-03 NOTE — Patient Instructions (Signed)
Please take all new medication as prescribed - the antibiotic, and cough medicine  Please take all new medication as prescribed - the celexa 20 mg per day  Please continue all other medications as before  Please have the pharmacy call with any other refills you may need.  Please continue your efforts at being more active, low cholesterol diet, and weight control.  Please keep your appointments with your specialists as you may have planned  Please return in Feb 2018, or sooner if needed, with Lab testing done 3-5 days before

## 2016-01-05 NOTE — Assessment & Plan Note (Signed)
Mild to mod, verified no SI or HI, for celexa course,  to f/u any worsening symptoms or concerns

## 2016-01-05 NOTE — Assessment & Plan Note (Signed)
Mild to mod, for antibx course,  to f/u any worsening symptoms or concerns 

## 2016-01-05 NOTE — Assessment & Plan Note (Signed)
stable overall by history and exam, recent data reviewed with pt, and pt to continue medical treatment as before,  to f/u any worsening symptoms or concerns @LASTSAO2(3)@  

## 2016-01-05 NOTE — Assessment & Plan Note (Signed)
Mild to mod, for xanax prn,  to f/u any worsening symptoms or concerns 

## 2016-01-17 ENCOUNTER — Ambulatory Visit: Payer: Medicare HMO | Admitting: Cardiology

## 2016-02-25 ENCOUNTER — Ambulatory Visit (INDEPENDENT_AMBULATORY_CARE_PROVIDER_SITE_OTHER): Payer: Medicare Other | Admitting: Internal Medicine

## 2016-02-25 ENCOUNTER — Encounter: Payer: Self-pay | Admitting: Internal Medicine

## 2016-02-25 VITALS — BP 98/54 | HR 75 | Ht 64.0 in | Wt 108.8 lb

## 2016-02-25 DIAGNOSIS — R011 Cardiac murmur, unspecified: Secondary | ICD-10-CM | POA: Diagnosis not present

## 2016-02-25 NOTE — Progress Notes (Signed)
Cardiology Office Note   Date:  02/25/2016   ID:  Kimberly [name] [birth year], MRN [id number]  PCP:  Cathlean Cower, MD  Cardiologist:   Dorris Carnes, MD       History of Present Illness: Kimberly [name] is a 71 y.o. female with a history of  congenital heart disease (s/p VSD repair), PVOD, dyslipidemia, COPD and tobacco use. \  Last echo in Dec 2015 LVEF was 40 to 45% No VSD  She is also st/p SVT ablation in May 2016.  During procedure had transient afib   Now off of amiodarone   Since she was seen she has done fair  Has occasional palptiations  Different from before  No dizziness Fairly short lived   Had bronchitis this winter   Was on 2 courses of prednisone  Got better Now feels like worse again  Cough is rel nonproductive   Pt has had dizziness  SInce fall she has had increased stress  Husband sick Cut back on eating  Dizzy  HHN checked bP  90s      Current Meds  Medication Sig  . albuterol (VENTOLIN HFA) 108 (90 Base) MCG/ACT inhaler Inhale 2 puffs into the lungs every 6 (six) hours as needed for wheezing or shortness of breath.  . ALPRAZolam (XANAX) 0.25 MG tablet Take 1 tablet (0.25 mg total) by mouth 2 (two) times daily as needed for anxiety.  . budesonide-formoterol (SYMBICORT) 160-4.5 MCG/ACT inhaler Inhale 2 puffs into the lungs 2 (two) times daily as needed (wheezing and sob).  . celecoxib (CELEBREX) 100 MG capsule Take 1 capsule (100 mg total) by mouth 2 (two) times daily as needed.  . chlorpheniramine-HYDROcodone (TUSSIONEX PENNKINETIC ER) 10-8 MG/5ML SUER Take 5 mLs by mouth every 12 (twelve) hours as needed for cough.  . citalopram (CELEXA) 20 MG tablet Take 1 tablet (20 mg total) by mouth daily.  . furosemide (LASIX) 20 MG tablet Take 1 tablet (20 mg total) by mouth daily as needed.  Marland Kitchen lisinopril (PRINIVIL,ZESTRIL) 2.5 MG tablet TAKE 1 TABLET BY MOUTH EVERY DAY.  . rosuvastatin (CRESTOR) 10 MG tablet Take 1 tablet (10 mg total) by mouth daily.     Allergies:    Codeine   Past Medical History:  Diagnosis Date  . Anxiety   . Atrial tachycardia ([place])   . Bursitis of shoulder, right, adhesive   . CHF (congestive heart failure) ([place])   . Chronic bronchitis ([place])    "1-2 times/yr" (01/23/2014)  . COPD (chronic obstructive pulmonary disease) ([place])   . Dyslipidemia   . Dysrhythmia   . Frequency of urination   . GERD (gastroesophageal reflux disease)   . Heart murmur   . History of stomach ulcers   . HTN (hypertension) 02/22/2011  . Migraines    "stopped many years ago" (06/14/2014)  . Pericarditis   . Pneumonia "10 times" (06/14/2014)  . PVOD (pulmonary veno-occlusive disease) ([place])   . 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 Silent myocardial infarction "late 1990's"  . Stress incontinence    "was suppose to have been tacked up years ago but I didn't do it"  . Syncope, near    Associated with atrial tachycardia-event recorder 1/16  . Thoracic outlet syndrome   . VSD (ventricular septal defect)     Past Surgical History:  Procedure Laterality Date  . CARDIAC CATHETERIZATION  "quite a few"  . [place]  . CHOLECYSTECTOMY OPEN  1970's  .  CORONARY ANGIOGRAM  2000   No significant CAD  . ELECTROPHYSIOLOGIC STUDY N/A 06/14/2014   Procedure: A-Flutter/A-Tach/SVT Ablation;  Surgeon: Evans Lance, MD;  Location: [facility];  Service: Cardiovascular;  Laterality: N/A;  . MYRINGOTOMY WITH TUBE PLACEMENT Right 2015  . TUBAL LIGATION  1972  . VENTRICULAR ABLATION SURGERY  06/14/2014  . VSD REPAIR  1958; 81     Social History:  The patient  reports that she has been smoking Cigarettes.  She has a 11.55 pack-year smoking history. She has never used smokeless tobacco. She reports that she drinks alcohol. She reports that she does not use drugs.   Family History:  The patient's family history includes Alcohol abuse in her other; Arthritis in her other and other; Cancer in her father, mother,  and other; Hypertension in her other; Stroke in her other.    ROS:  Please see the history of present illness. All other systems are reviewed and  Negative to the above problem except as noted.    PHYSICAL EXAM: VS:  BP (!) 98/54   Pulse 75   Ht 5\' 4"  (1.626 m)   Wt 108 lb 12.8 oz (49.4 kg)   BMI 18.68 kg/m   GEN: Well nourished, well developed, in no acute distress  HEENT: normal  Neck: no JVD, carotid bruits, or masses Cardiac: RRR; III/VI systolic murmur LSB  , rubs, or gallops,no edema  Respiratory:  Decreasd airflow  Some pops   GI: soft, nontender, nondistended, + BS  No hepatomegaly  MS: no deformity Moving all extremities   Skin: warm and dry, no rash Neuro:  Strength and sensation are intact Psych: euthymic mood, full affect   EKG:  EKG is not ordered today.   Lipid Panel    Component Value Date/Time   CHOL 206 (H) 02/27/2015 1447   TRIG 101.0 02/27/2015 1447   HDL 73.30 02/27/2015 1447   CHOLHDL 3 02/27/2015 1447   VLDL 20.2 02/27/2015 1447   LDLCALC 113 (H) 02/27/2015 1447   LDLDIRECT 112.6 03/16/2013 1057      Wt Readings from Last 3 Encounters:  02/25/16 108 lb 12.8 oz (49.4 kg)  01/03/16 108 lb (49 kg)  11/02/15 112 lb 6.4 oz (51 kg)      ASSESSMENT AND PLAN:  1  SVT   I am not convinced of problmes currnelty  Follow  2  Hx VSD s/p closure  REpeat echo    3  COPD  Signif but moving air.     4  HTN  Stop lisinopril with marginal BPs  4  Wt down  May explain BP  Try to eat  Cut back and d/c lisinoprol    F/U in clinic in the fall     Current medicines are reviewed at length with the patient today.  The patient does not have concerns regarding medicines.  Signed, Dorris Carnes, MD  02/25/2016 9:33 AM    [address], [place], [place]  [address] Phone: [phone]; Fax: [phone]

## 2016-02-25 NOTE — Patient Instructions (Signed)
Your physician has recommended you make the following change in your medication:  1.) stop lisinopril  Your physician has requested that you have an echocardiogram. Echocardiography is a painless test that uses sound waves to create images of your heart. It provides your doctor with information about the size and shape of your heart and how well your heart's chambers and valves are working. This procedure takes approximately one hour. There are no restrictions for this procedure.  Your physician wants you to follow-up in: September, 2018 with Dr. Harrington Challenger.  You will receive a reminder letter in the mail two months in advance. If you don't receive a letter, please call our office to schedule the follow-up appointment.

## 2016-03-10 ENCOUNTER — Other Ambulatory Visit (HOSPITAL_COMMUNITY): Payer: Medicare Other

## 2016-03-21 ENCOUNTER — Ambulatory Visit (INDEPENDENT_AMBULATORY_CARE_PROVIDER_SITE_OTHER): Payer: Medicare Other | Admitting: Internal Medicine

## 2016-03-21 ENCOUNTER — Encounter: Payer: Self-pay | Admitting: Internal Medicine

## 2016-03-21 VITALS — BP 116/78 | HR 73 | Temp 98.1°F | Ht 61.0 in | Wt 113.0 lb

## 2016-03-21 DIAGNOSIS — J309 Allergic rhinitis, unspecified: Secondary | ICD-10-CM

## 2016-03-21 DIAGNOSIS — F329 Major depressive disorder, single episode, unspecified: Secondary | ICD-10-CM | POA: Diagnosis not present

## 2016-03-21 DIAGNOSIS — J441 Chronic obstructive pulmonary disease with (acute) exacerbation: Secondary | ICD-10-CM

## 2016-03-21 DIAGNOSIS — J019 Acute sinusitis, unspecified: Secondary | ICD-10-CM

## 2016-03-21 DIAGNOSIS — F32A Depression, unspecified: Secondary | ICD-10-CM

## 2016-03-21 DIAGNOSIS — E8770 Fluid overload, unspecified: Secondary | ICD-10-CM | POA: Diagnosis not present

## 2016-03-21 MED ORDER — BUPROPION HCL ER (XL) 150 MG PO TB24: 150.0000 mg | ORAL_TABLET | Freq: Every day | ORAL | 3 refills | Status: DC

## 2016-03-21 MED ORDER — HYDROCOD POLST-CPM POLST ER 10-8 MG/5ML PO SUER: 5.0000 mL | Freq: Two times a day (BID) | ORAL | 0 refills | Status: DC | PRN

## 2016-03-21 MED ORDER — METHYLPREDNISOLONE ACETATE 80 MG/ML IJ SUSP
80.0000 mg | Freq: Once | INTRAMUSCULAR | Status: AC
  Administered 2016-03-21: 80 mg via INTRAMUSCULAR

## 2016-03-21 MED ORDER — FUROSEMIDE 20 MG PO TABS: 20.0000 mg | ORAL_TABLET | Freq: Every day | ORAL | 11 refills | Status: DC | PRN

## 2016-03-21 MED ORDER — PREDNISONE 10 MG PO TABS: ORAL_TABLET | ORAL | 0 refills | Status: DC

## 2016-03-21 NOTE — Patient Instructions (Signed)
You had the steroid shot today  Please take all new medication as prescribed - the prednisone, tussionex, and wellbutrin   Please continue all other medications as before, and refills have been done if requested - the lasix  Please have the pharmacy call with any other refills you may need.  Please continue your efforts at being more active, low cholesterol diet, and weight control.  Please keep your appointments with your specialists as you may have planned

## 2016-03-22 DIAGNOSIS — J309 Allergic rhinitis, unspecified: Secondary | ICD-10-CM | POA: Insufficient documentation

## 2016-03-22 NOTE — Assessment & Plan Note (Signed)
With mod seasonal flare, for depomedrol IM 80, also otc nasacort asd

## 2016-03-22 NOTE — Progress Notes (Signed)
Subjective:    Patient ID: Kimberly [name], female    DOB: Jan 13, 1946, 70 y.o.   MRN: [id number]  HPI  Here with 1 wk gradually worsening allergy symtpoms and wheezing.  Does have several wks ongoing nasal allergy symptoms with clearish congestion, itch and sneezing, without fever, pain, ST, cough, but with worsening also non prod cough with wheezing and mild sob/doe, as well as slight worsening bilat ankle edema.  Has gained several lbs.   Pt denies fever, wt loss, night sweats, loss of appetite, or other constitutional symptoms Wt Readings from Last 3 Encounters:  03/21/16 113 lb (51.3 kg)  02/25/16 108 lb 12.8 oz (49.4 kg)  01/03/16 108 lb (49 kg)  c/o mild worsening depressive symptoms, but no suicidal ideation, or panic; has ongoing anxiety, not increased recently. Has tried chantix for quitting smoking but no help, asks for wellbutrin as has not tried. Past Medical History:  Diagnosis Date  . Anxiety   . Atrial tachycardia ([place])   . Bursitis of shoulder, right, adhesive   . CHF (congestive heart failure) ([place])   . Chronic bronchitis ([place])    "1-2 times/yr" (01/23/2014)  . COPD (chronic obstructive pulmonary disease) ([place])   . Dyslipidemia   . Dysrhythmia   . Frequency of urination   . GERD (gastroesophageal reflux disease)   . Heart murmur   . History of stomach ulcers   . HTN (hypertension) 02/22/2011  . Migraines    "stopped many years ago" (06/14/2014)  . Pericarditis   . Pneumonia "10 times" (06/14/2014)  . PVOD (pulmonary veno-occlusive disease) ([place])   . 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 Silent myocardial infarction "late 1990's"  . Stress incontinence    "was suppose to have been tacked up years ago but I didn't do it"  . Syncope, near    Associated with atrial tachycardia-event recorder 1/16  . Thoracic outlet syndrome   . VSD (ventricular septal defect)    Past Surgical History:  Procedure Laterality Date  . CARDIAC  CATHETERIZATION  "quite a few"  . [place]  . CHOLECYSTECTOMY OPEN  1970's  . CORONARY ANGIOGRAM  2000   No significant CAD  . ELECTROPHYSIOLOGIC STUDY N/A 06/14/2014   Procedure: A-Flutter/A-Tach/SVT Ablation;  Surgeon: Evans Lance, MD;  Location: [facility];  Service: Cardiovascular;  Laterality: N/A;  . MYRINGOTOMY WITH TUBE PLACEMENT Right 2015  . TUBAL LIGATION  1972  . VENTRICULAR ABLATION SURGERY  06/14/2014  . VSD REPAIR  1958; 1967    reports that she has been smoking Cigarettes.  She has a 11.55 pack-year smoking history. She has never used smokeless tobacco. She reports that she drinks alcohol. She reports that she does not use drugs. family history includes Alcohol abuse in her other; Arthritis in her other and other; Cancer in her father, mother, and other; Hypertension in her other; Stroke in her other. Allergies  Allergen Reactions  . Codeine     Nausea    Current Outpatient Prescriptions on File Prior to Visit  Medication Sig Dispense Refill  . albuterol (VENTOLIN HFA) 108 (90 Base) MCG/ACT inhaler Inhale 2 puffs into the lungs every 6 (six) hours as needed for wheezing or shortness of breath. 18 g 11  . ALPRAZolam (XANAX) 0.25 MG tablet Take 1 tablet (0.25 mg total) by mouth 2 (two) times daily as needed for anxiety. 60 tablet 2  . budesonide-formoterol (SYMBICORT) 160-4.5 MCG/ACT inhaler Inhale 2 puffs  into the lungs 2 (two) times daily as needed (wheezing and sob).    . celecoxib (CELEBREX) 100 MG capsule Take 1 capsule (100 mg total) by mouth 2 (two) times daily as needed. 60 capsule 5  . citalopram (CELEXA) 20 MG tablet Take 1 tablet (20 mg total) by mouth daily. 90 tablet 3  . rosuvastatin (CRESTOR) 10 MG tablet Take 1 tablet (10 mg total) by mouth daily. 90 tablet 3   No current facility-administered medications on file prior to visit.    Review of Systems  Constitutional: Negative for unusual diaphoresis or night sweats HENT: Negative  for ear swelling or discharge Eyes: Negative for worsening visual haziness  Respiratory: Negative for choking and stridor.   Gastrointestinal: Negative for distension or worsening eructation Genitourinary: Negative for retention or change in urine volume.  Musculoskeletal: Negative for other MSK pain or swelling Skin: Negative for color change and worsening wound Neurological: Negative for tremors and numbness other than noted  Psychiatric/Behavioral: Negative for decreased concentration or agitation other than above   All other system neg per pt    Objective:   Physical Exam BP 116/78   Pulse 73   Temp 98.1 F (36.7 C)   Ht 5\' 1"  (1.549 m)   Wt 113 lb (51.3 kg)   SpO2 100%   BMI 21.35 kg/m  VS noted,  Constitutional: Pt appears in no apparent distress HENT: Head: NCAT.  Right Ear: External ear normal.  Left Ear: External ear normal.  Eyes: . Pupils are equal, round, and reactive to light. Conjunctivae and EOM are normal Bilat tm's with mild erythema.  Max sinus areas non tender.  Pharynx with mild erythema, no exudate Neck: Normal range of motion. Neck supple.  Cardiovascular: Normal rate and regular rhythm.   Pulmonary/Chest: Effort normal and breath sounds without rales or wheezing.  Abd:  Soft, NT, ND, + BS Neurological: Pt is alert. Not confused , motor grossly intact Skin: Skin is warm. No rash, + bilat 1+ ankle LE edema Psychiatric: Pt behavior is normal. No agitation. + depressed affect No other new exam findings    Assessment & Plan:

## 2016-03-22 NOTE — Assessment & Plan Note (Signed)
Mild with hx of smoking, ok for wellbutrin xl 150 qd, cont celexa, declines counseling or psychiatric referral

## 2016-03-22 NOTE — Assessment & Plan Note (Addendum)
No fever, overall mild to mod, for depomedrol IM 80 as above, predpac asd, cough med prn, also for cont'd inhaler use

## 2016-03-22 NOTE — Assessment & Plan Note (Signed)
Volume increase vs pulm HTN I suspect, for lasix 20 qd prn,  to f/u any worsening symptoms or concerns

## 2016-03-25 ENCOUNTER — Ambulatory Visit (HOSPITAL_COMMUNITY): Payer: Medicare Other | Attending: Internal Medicine

## 2016-03-25 ENCOUNTER — Other Ambulatory Visit: Payer: Self-pay

## 2016-03-25 DIAGNOSIS — I110 Hypertensive heart disease with heart failure: Secondary | ICD-10-CM | POA: Diagnosis not present

## 2016-03-25 DIAGNOSIS — E785 Hyperlipidemia, unspecified: Secondary | ICD-10-CM | POA: Diagnosis not present

## 2016-03-25 DIAGNOSIS — I509 Heart failure, unspecified: Secondary | ICD-10-CM | POA: Diagnosis not present

## 2016-03-25 DIAGNOSIS — J449 Chronic obstructive pulmonary disease, unspecified: Secondary | ICD-10-CM | POA: Insufficient documentation

## 2016-03-25 DIAGNOSIS — R011 Cardiac murmur, unspecified: Secondary | ICD-10-CM

## 2016-03-31 ENCOUNTER — Other Ambulatory Visit: Payer: Self-pay | Admitting: Internal Medicine

## 2016-04-02 ENCOUNTER — Encounter: Payer: Self-pay | Admitting: Internal Medicine

## 2016-04-04 ENCOUNTER — Other Ambulatory Visit: Payer: Self-pay | Admitting: *Deleted

## 2016-04-04 DIAGNOSIS — E785 Hyperlipidemia, unspecified: Secondary | ICD-10-CM

## 2016-04-04 DIAGNOSIS — R931 Abnormal findings on diagnostic imaging of heart and coronary circulation: Secondary | ICD-10-CM

## 2016-04-04 NOTE — Progress Notes (Signed)
Notes Recorded by Fay Records, MD on 04/02/2016 at 11:49 AM EDT REviewed echo with pt  Not dizzy BP is better Off meds Cut back on fluids  I would recomm : CMET, BNP, CBC, TSH, Lipids Pt is going to [facility] with husband (get radiation therapy) I would like labs to be drawn there [facility]) Call pt to coordinate/organize   Called patient.  She is going with her husband on Monday for his radiation treatment to [facility].  All labs ordered under [facility] except ProBNP is still going to [facility], may need to be a send out.  Advised patient to go to the lab and what she needs drawn should be in their system.

## 2016-04-08 ENCOUNTER — Other Ambulatory Visit
Admission: RE | Admit: 2016-04-08 | Discharge: 2016-04-08 | Disposition: A | Discharge status: Home / Self Care | Payer: Medicare Other | Source: Ambulatory Visit | Attending: Internal Medicine | Admitting: Internal Medicine

## 2016-04-08 DIAGNOSIS — E785 Hyperlipidemia, unspecified: Secondary | ICD-10-CM | POA: Diagnosis not present

## 2016-04-08 DIAGNOSIS — R931 Abnormal findings on diagnostic imaging of heart and coronary circulation: Secondary | ICD-10-CM | POA: Diagnosis not present

## 2016-04-08 LAB — CBC
HEMATOCRIT: 39.7 % (ref 35.0–47.0)
HEMOGLOBIN: 13.6 g/dL (ref 12.0–16.0)
MCH: 31.7 pg (ref 26.0–34.0)
MCHC: 34.4 g/dL (ref 32.0–36.0)
MCV: 92.4 fL (ref 80.0–100.0)
Platelets: 191 10*3/uL (ref 150–440)
RBC: 4.29 MIL/uL (ref 3.80–5.20)
RDW: 14.8 % — AB (ref 11.5–14.5)
WBC: 16.5 10*3/uL — AB (ref 3.6–11.0)

## 2016-04-08 LAB — LIPID PANEL
CHOLESTEROL: 141 mg/dL (ref 0–200)
HDL: 65 mg/dL (ref >40)
LDL CALC: 65 mg/dL (ref 0–99)
TRIGLYCERIDES: 55 mg/dL (ref <150)
Total CHOL/HDL Ratio: 2.2 RATIO
VLDL: 11 mg/dL (ref 0–40)

## 2016-04-08 LAB — COMPREHENSIVE METABOLIC PANEL
ALT: 20 U/L (ref 14–54)
ANION GAP: 10 (ref 5–15)
AST: 26 U/L (ref 15–41)
Albumin: 3.5 g/dL (ref 3.5–5.0)
Alkaline Phosphatase: 71 U/L (ref 38–126)
BILIRUBIN TOTAL: 1.6 mg/dL — AB (ref 0.3–1.2)
BUN: 18 mg/dL (ref 6–20)
CHLORIDE: 98 mmol/L — AB (ref 101–111)
CO2: 24 mmol/L (ref 22–32)
Calcium: 8.4 mg/dL — ABNORMAL LOW (ref 8.9–10.3)
Creatinine, Ser: 0.72 mg/dL (ref 0.44–1.00)
Glucose, Bld: 103 mg/dL — ABNORMAL HIGH (ref 65–99)
Potassium: 3.5 mmol/L (ref 3.5–5.1)
Sodium: 132 mmol/L — ABNORMAL LOW (ref 135–145)
Total Protein: 6.9 g/dL (ref 6.5–8.1)

## 2016-04-08 LAB — TSH: TSH: 0.606 u[IU]/mL (ref 0.350–4.500)

## 2016-04-10 ENCOUNTER — Ambulatory Visit (INDEPENDENT_AMBULATORY_CARE_PROVIDER_SITE_OTHER): Payer: Medicare Other | Admitting: Internal Medicine

## 2016-04-10 ENCOUNTER — Ambulatory Visit (INDEPENDENT_AMBULATORY_CARE_PROVIDER_SITE_OTHER)
Admission: RE | Admit: 2016-04-10 | Discharge: 2016-04-10 | Disposition: A | Discharge status: Home / Self Care | Payer: Medicare Other | Source: Ambulatory Visit | Attending: Internal Medicine | Admitting: Internal Medicine

## 2016-04-10 ENCOUNTER — Encounter: Payer: Self-pay | Admitting: Internal Medicine

## 2016-04-10 VITALS — BP 116/74 | HR 74 | Temp 100.3°F | Ht 61.0 in | Wt 109.0 lb

## 2016-04-10 DIAGNOSIS — R6889 Other general symptoms and signs: Secondary | ICD-10-CM | POA: Diagnosis not present

## 2016-04-10 DIAGNOSIS — J441 Chronic obstructive pulmonary disease with (acute) exacerbation: Secondary | ICD-10-CM

## 2016-04-10 DIAGNOSIS — I10 Essential (primary) hypertension: Secondary | ICD-10-CM

## 2016-04-10 DIAGNOSIS — K5900 Constipation, unspecified: Secondary | ICD-10-CM

## 2016-04-10 DIAGNOSIS — R0602 Shortness of breath: Secondary | ICD-10-CM | POA: Diagnosis not present

## 2016-04-10 DIAGNOSIS — R059 Cough, unspecified: Secondary | ICD-10-CM

## 2016-04-10 DIAGNOSIS — R05 Cough: Secondary | ICD-10-CM

## 2016-04-10 LAB — POC INFLUENZA A&B (BINAX/QUICKVUE)

## 2016-04-10 MED ORDER — PREDNISONE 10 MG PO TABS: ORAL_TABLET | ORAL | 0 refills | Status: DC

## 2016-04-10 MED ORDER — LEVOFLOXACIN 500 MG PO TABS: 500.0000 mg | ORAL_TABLET | Freq: Every day | ORAL | 0 refills | Status: AC

## 2016-04-10 MED ORDER — METHYLPREDNISOLONE ACETATE 80 MG/ML IJ SUSP
80.0000 mg | Freq: Once | INTRAMUSCULAR | Status: AC
  Administered 2016-04-10: 80 mg via INTRAMUSCULAR

## 2016-04-10 NOTE — Progress Notes (Signed)
Subjective:    Patient ID: Kimberly [name], female    DOB: October 17, 1945, 71 y.o.   MRN: [id number]  HPI   Here with 2-3 days acute onset fever, ST pain,  pressure, headache, general weakness and malaise, sob/doe, and greenish prod cough, but pt denies chest pain, wheezing, orthopnea, PND, increased LE swelling, palpitations, dizziness or syncope.  Also has  bilat lower costal margin pain, worse after picked up water bottles at [employer], worse with coughing.  Pt denies new neurological symptoms such as new headache, or facial or extremity weakness or numbness   Pt denies polydipsia, polyuria,  Has also had worsening mild constipation over the past wk with mild nausea, crampy pains and no BM for several days Past Medical History:  Diagnosis Date  . Anxiety   . Atrial tachycardia ([place])   . Bursitis of shoulder, right, adhesive   . CHF (congestive heart failure) ([place])   . Chronic bronchitis ([place])    "1-2 times/yr" (01/23/2014)  . COPD (chronic obstructive pulmonary disease) ([place])   . CVD (cerebrovascular disease)   . Dyslipidemia   . Dysrhythmia   . Frequency of urination   . GERD (gastroesophageal reflux disease)   . Heart murmur   . History of stomach ulcers   . HTN (hypertension) 02/22/2011  . Migraines    "stopped many years ago" (06/14/2014)  . Pericarditis   . Pneumonia "10 times" (06/14/2014)  . 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 Silent myocardial infarction "late 1990's"  . Stress incontinence    "was suppose to have been tacked up years ago but I didn't do it"  . Syncope, near    Associated with atrial tachycardia-event recorder 1/16  . Thoracic outlet syndrome   . VSD (ventricular septal defect)    Past Surgical History:  Procedure Laterality Date  . CARDIAC CATHETERIZATION  "quite a few"  . [place]  . CHOLECYSTECTOMY OPEN  1970's  . CORONARY ANGIOGRAM  2000   No significant CAD  . ELECTROPHYSIOLOGIC STUDY N/A 06/14/2014     Procedure: A-Flutter/A-Tach/SVT Ablation;  Surgeon: Evans Lance, MD;  Location: [facility];  Service: Cardiovascular;  Laterality: N/A;  . MYRINGOTOMY WITH TUBE PLACEMENT Right 2015  . SVT ABLATION  06/14/2014  . TUBAL LIGATION  1972  . VSD REPAIR  1958; 1967    reports that she has been smoking Cigarettes.  She has a 11.55 pack-year smoking history. She has never used smokeless tobacco. She reports that she drinks alcohol. She reports that she does not use drugs. family history includes Alcohol abuse in her other; Arthritis in her other and other; Cancer in her father, mother, and other; Hypertension in her other; Stroke in her other. Allergies  Allergen Reactions  . Codeine     Nausea    Current Outpatient Prescriptions on File Prior to Visit  Medication Sig Dispense Refill  . albuterol (VENTOLIN HFA) 108 (90 Base) MCG/ACT inhaler Inhale 2 puffs into the lungs every 6 (six) hours as needed for wheezing or shortness of breath. 18 g 11  . ALPRAZolam (XANAX) 0.25 MG tablet Take 1 tablet (0.25 mg total) by mouth 2 (two) times daily as needed for anxiety. 60 tablet 2  . budesonide-formoterol (SYMBICORT) 160-4.5 MCG/ACT inhaler Inhale 2 puffs into the lungs 2 (two) times daily as needed (wheezing and sob).    Marland Kitchen buPROPion (WELLBUTRIN XL) 150 MG 24 hr tablet Take 1 tablet (150 mg total) by  mouth daily. 90 tablet 3  . celecoxib (CELEBREX) 100 MG capsule Take 1 capsule (100 mg total) by mouth 2 (two) times daily as needed. 60 capsule 5  . chlorpheniramine-HYDROcodone (TUSSIONEX PENNKINETIC ER) 10-8 MG/5ML SUER Take 5 mLs by mouth every 12 (twelve) hours as needed for cough. 180 mL 0  . citalopram (CELEXA) 20 MG tablet Take 1 tablet (20 mg total) by mouth daily. 90 tablet 3  . furosemide (LASIX) 20 MG tablet Take 1 tablet (20 mg total) by mouth daily as needed. 30 tablet 11  . lisinopril (PRINIVIL,ZESTRIL) 2.5 MG tablet     . rosuvastatin (CRESTOR) 10 MG tablet TAKE 1 TABLET BY MOUTH  EVERY DAY. 90 tablet 3   No current facility-administered medications on file prior to visit.    Review of Systems  Constitutional: Negative for unusual diaphoresis or night sweats HENT: Negative for ear swelling or discharge Eyes: Negative for worsening visual haziness  Respiratory: Negative for choking and stridor.   Gastrointestinal: Negative for distension or worsening eructation Genitourinary: Negative for retention or change in urine volume.  Musculoskeletal: Negative for other MSK pain or swelling Skin: Negative for color change and worsening wound Neurological: Negative for tremors and numbness other than noted  Psychiatric/Behavioral: Negative for decreased concentration or agitation other than above   All other system neg per pt    Objective:   Physical Exam BP 116/74   Pulse 74   Temp 100.3 F (37.9 C) (Oral)   Ht 5\' 1"  (1.549 m)   Wt 109 lb (49.4 kg)   SpO2 96%   BMI 20.60 kg/m   Ambul o2 sat on RA - 90-93% VS noted,  Constitutional: Pt appears in no apparent distress HENT: Head: NCAT.  Right Ear: External ear normal.  Left Ear: External ear normal.  Eyes: . Pupils are equal, round, and reactive to light. Conjunctivae and EOM are normal Neck: Normal range of motion. Neck supple.  Cardiovascular: Normal rate and regular rhythm.   Pulmonary/Chest: Effort normal and breath sounds decreased without rales but with few scattered wheezing.  Abd:  Soft, NT, ND, + BS, + bilat lower anterior costal margin tender Neurological: Pt is alert. Not confused , motor grossly intact Skin: Skin is warm. No rash, no LE edema Psychiatric: Pt behavior is normal. No agitation.  No other exam findings  POC Influenza A&B(BINAX/QUICKVUE)  Order: [id number]  Status:  Final result Visible to patient:  No (Not Released) Dx:  Flu-like symptoms   Ref Range & Units 12:21  Influenza A, POC Negative   Influenza B, POC Negative           Assessment & Plan:

## 2016-04-10 NOTE — Progress Notes (Signed)
Pre visit review using our clinic review tool, if applicable. No additional management support is needed unless otherwise documented below in the visit note. 

## 2016-04-10 NOTE — Patient Instructions (Addendum)
Your flu test was negative  Your walking oxygen test was OK  You had the steroid shot today  Please take all new medication as prescribed - the antibiotic, and prednisone  Please continue all other medications as before, including the cough medicine you have at home  Please have the pharmacy call with any other refills you may need..  Please keep your appointments with your specialists as you may have planned  Please go to the XRAY Department in the Basement (go straight as you get off the elevator) for the x-ray testing  You will be contacted by phone if any changes need to be made immediately.  Otherwise, you will receive a letter about your results with an explanation, but please check with MyChart first.  Please remember to sign up for MyChart if you have not done so, as this will be important to you in the future with finding out test results, communicating by private email, and scheduling acute appointments online when needed.

## 2016-04-11 ENCOUNTER — Telehealth: Payer: Self-pay | Admitting: Internal Medicine

## 2016-04-11 NOTE — Telephone Encounter (Signed)
New Message  Pt voiced she has pneumonia and she's received different injections would like to discuss it with nurse/MD.  Please f/u

## 2016-04-11 NOTE — Telephone Encounter (Signed)
Patient seen by PCP yesterday for acute illness, had CXR.  Dx: pneumonia.  She was given steroid injection at office and sent home on prednisone and levaquin.  She now wants to know from Dr. Harrington Challenger is there anything else she should be doing.  I advised to take Tylenol.  She only has Aleve.  Reviewed Dr. Gwynn Burly note and recommendations that is she has any worsening symptoms or any falls that she needs to go to the ER.  Discussed with patient.    She is very SOB sounding on the phone talking to me.   She states its worse because of talking and at rest its not so bad.  She does not feel as bad as yesterday but still very ill.  Reminded her to go to ER if she feels any worse than she does right now and she is in agreement.  She is aware I will forward the message to Dr. Harrington Challenger and will call her back with any further recommendations.

## 2016-04-11 NOTE — Telephone Encounter (Signed)
Left msg on Voicemail re labs from earlier in week  Also told her that if breathing did nto get better in next 24 to 48 hours or if got worse that she should go to ER

## 2016-04-12 NOTE — Assessment & Plan Note (Signed)
For miralax asd,  to f/u any worsening symptoms or concerns

## 2016-04-12 NOTE — Assessment & Plan Note (Addendum)
Mild to mod, for depomedrol IM 80, predpac asd, to f/u any worsening symptoms or concerns 

## 2016-04-12 NOTE — Assessment & Plan Note (Signed)
Mild to mod, c/w bornchitis vs pna, for cxr, for antibx course,  Has cough med prn at home, to f/u any worsening symptoms or concerns

## 2016-04-12 NOTE — Assessment & Plan Note (Signed)
stable overall by history and exam, recent data reviewed with pt, and pt to continue medical treatment as before,  to f/u any worsening symptoms or concerns. BP Readings from Last 3 Encounters:  04/10/16 116/74  03/21/16 116/78  02/25/16 (!) 98/54

## 2016-04-17 ENCOUNTER — Ambulatory Visit: Payer: Medicare Other | Admitting: Internal Medicine

## 2016-05-02 ENCOUNTER — Ambulatory Visit: Payer: Medicare Other | Admitting: Internal Medicine

## 2016-05-06 ENCOUNTER — Ambulatory Visit (INDEPENDENT_AMBULATORY_CARE_PROVIDER_SITE_OTHER)
Admission: RE | Admit: 2016-05-06 | Discharge: 2016-05-06 | Disposition: A | Discharge status: Home / Self Care | Payer: Medicare Other | Source: Ambulatory Visit | Attending: Internal Medicine | Admitting: Internal Medicine

## 2016-05-06 ENCOUNTER — Ambulatory Visit (INDEPENDENT_AMBULATORY_CARE_PROVIDER_SITE_OTHER): Payer: Medicare Other | Admitting: Internal Medicine

## 2016-05-06 VITALS — BP 110/76 | HR 66 | Temp 98.3°F | Wt 110.0 lb

## 2016-05-06 DIAGNOSIS — J441 Chronic obstructive pulmonary disease with (acute) exacerbation: Secondary | ICD-10-CM | POA: Diagnosis not present

## 2016-05-06 DIAGNOSIS — J189 Pneumonia, unspecified organism: Secondary | ICD-10-CM | POA: Diagnosis not present

## 2016-05-06 DIAGNOSIS — H905 Unspecified sensorineural hearing loss: Secondary | ICD-10-CM

## 2016-05-06 MED ORDER — METHYLPREDNISOLONE ACETATE 80 MG/ML IJ SUSP
80.0000 mg | Freq: Once | INTRAMUSCULAR | Status: AC
  Administered 2016-05-06: 80 mg via INTRAMUSCULAR

## 2016-05-06 MED ORDER — PREDNISONE 10 MG PO TABS: ORAL_TABLET | ORAL | 0 refills | Status: DC

## 2016-05-06 NOTE — Progress Notes (Signed)
Subjective:    Patient ID: Kimberly [name], female    DOB: August 16, 1945, 71 y.o.   MRN: [id number]  HPI  Here to f/u recent severe pna and copd exacerbation tx as outpt as she declined inpatient; Overall good compliance with treatment, and good medicine tolerability. Finished antibx and steroid tx, but still with scant prod cough whitish sputum, scratchy throat she thinks from the persistent cough, mild persistent sob/doe over baseline, and intermittent left then right then left post back pain pointint to the lung bases, but no fever, colored sputum, frank wheezing or dizziness. Also has right ear slight discomfort, fullness, and muffled hearing after water in the shower; has a tube to right TM per Dr Sonny Dandy ENT, [place], and plans to f/u with him Past Medical History:  Diagnosis Date  . Anxiety   . Atrial tachycardia ([place])   . Bursitis of shoulder, right, adhesive   . CHF (congestive heart failure) ([place])   . Chronic bronchitis ([place])    "1-2 times/yr" (01/23/2014)  . COPD (chronic obstructive pulmonary disease) ([place])   . CVD (cerebrovascular disease)   . Dyslipidemia   . Dysrhythmia   . Frequency of urination   . GERD (gastroesophageal reflux disease)   . Heart murmur   . History of stomach ulcers   . HTN (hypertension) 02/22/2011  . Migraines    "stopped many years ago" (06/14/2014)  . Pericarditis   . Pneumonia "10 times" (06/14/2014)  . 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 Silent myocardial infarction "late 1990's"  . Stress incontinence    "was suppose to have been tacked up years ago but I didn't do it"  . Syncope, near    Associated with atrial tachycardia-event recorder 1/16  . Thoracic outlet syndrome   . VSD (ventricular septal defect)    Past Surgical History:  Procedure Laterality Date  . CARDIAC CATHETERIZATION  "quite a few"  . [place]  . CHOLECYSTECTOMY OPEN  1970's  . CORONARY ANGIOGRAM  2000   No  significant CAD  . ELECTROPHYSIOLOGIC STUDY N/A 06/14/2014   Procedure: A-Flutter/A-Tach/SVT Ablation;  Surgeon: Evans Lance, MD;  Location: [facility];  Service: Cardiovascular;  Laterality: N/A;  . MYRINGOTOMY WITH TUBE PLACEMENT Right 2015  . SVT ABLATION  06/14/2014  . TUBAL LIGATION  1972  . VSD REPAIR  1958; 1967    reports that she has been smoking Cigarettes.  She has a 11.55 pack-year smoking history. She has never used smokeless tobacco. She reports that she drinks alcohol. She reports that she does not use drugs. family history includes Alcohol abuse in her other; Arthritis in her other and other; Cancer in her father, mother, and other; Hypertension in her other; Stroke in her other. Allergies  Allergen Reactions  . Codeine     Nausea    Current Outpatient Prescriptions on File Prior to Visit  Medication Sig Dispense Refill  . albuterol (VENTOLIN HFA) 108 (90 Base) MCG/ACT inhaler Inhale 2 puffs into the lungs every 6 (six) hours as needed for wheezing or shortness of breath. 18 g 11  . ALPRAZolam (XANAX) 0.25 MG tablet Take 1 tablet (0.25 mg total) by mouth 2 (two) times daily as needed for anxiety. 60 tablet 2  . budesonide-formoterol (SYMBICORT) 160-4.5 MCG/ACT inhaler Inhale 2 puffs into the lungs 2 (two) times daily as needed (wheezing and sob).    Marland Kitchen buPROPion (WELLBUTRIN XL) 150 MG 24 hr tablet Take 1 tablet (  150 mg total) by mouth daily. 90 tablet 3  . celecoxib (CELEBREX) 100 MG capsule Take 1 capsule (100 mg total) by mouth 2 (two) times daily as needed. 60 capsule 5  . citalopram (CELEXA) 20 MG tablet Take 1 tablet (20 mg total) by mouth daily. 90 tablet 3  . furosemide (LASIX) 20 MG tablet Take 1 tablet (20 mg total) by mouth daily as needed. 30 tablet 11  . lisinopril (PRINIVIL,ZESTRIL) 2.5 MG tablet     . rosuvastatin (CRESTOR) 10 MG tablet TAKE 1 TABLET BY MOUTH EVERY DAY. 90 tablet 3   No current facility-administered medications on file prior to visit.     Review of Systems  Constitutional: Negative for other unusual diaphoresis or sweats HENT: Negative for ear discharge or swelling Eyes: Negative for other worsening visual disturbances Respiratory: Negative for stridor or other swelling  Gastrointestinal: Negative for worsening distension or other blood Genitourinary: Negative for retention or other urinary change Musculoskeletal: Negative for other MSK pain or swelling Skin: Negative for color change or other new lesions Neurological: Negative for worsening tremors and other numbness  Psychiatric/Behavioral: Negative for worsening agitation or other fatigue All other system neg per pt    Objective:   Physical Exam BP 110/76   Pulse 66   Temp 98.3 F (36.8 C) (Oral)   Wt 110 lb (49.9 kg)   SpO2 99%   BMI 20.78 kg/m  VS noted, no toxic Constitutional: Pt appears in NAD HENT: Head: NCAT.  Right Ear: External ear normal.  Right ear canal with wax impaction but right tube ? Partially coming out, noted as well Left Ear: External ear normal.  Bilat tm's with mild erythema.  Max sinus areas non tender.  Pharynx with mild erythema, no exudate Eyes: . Pupils are equal, round, and reactive to light. Conjunctivae and EOM are normal Nose: without d/c or deformity Neck: Neck supple. Gross normal ROM No neck LA Cardiovascular: Normal rate and regular rhythm.   Pulmonary/Chest: Effort normal and breath sounds decreased left base, with few wheezing.rhonchi, but no rales Neurological: Pt is alert. At baseline orientation, motor grossly intact Skin: Skin is warm. No rashes, other new lesions, no LE edema Psychiatric: Pt behavior is normal without agitation  No other exam findings    Study Result  - CXR summary 04/10/2016  CLINICAL DATA:  Fever shortness of breath and wheezing CHEST  2 VIEW   FINDINGS: Post sternotomy changes. Surgical clips in the right upper quadrant.  Development of lingular, left upper lobe and left lung  base pulmonary infiltrates. No effusion. Stable mild cardiomegaly. No pneumothorax. Probable right lower lung zone nipple shadow.  IMPRESSION: Development of multifocal opacities within the left upper lobe an the left lung base with possible tiny left pleural effusion. Suspect multifocal pneumonia. Radiographic follow-up to resolution is recommended.        Assessment & Plan:

## 2016-05-06 NOTE — Assessment & Plan Note (Signed)
clinially improved or resolved, for f/u cxr today, would hold further antibx at this point pending results

## 2016-05-06 NOTE — Assessment & Plan Note (Signed)
Has evidence for partiall falling out tube to TM per ENt as well as wax impaction - I placed referral for insurance purpose, but pt plans to call tomorrow for f/u appt

## 2016-05-06 NOTE — Progress Notes (Signed)
Pre visit review using our clinic review tool, if applicable. No additional management support is needed unless otherwise documented below in the visit note. 

## 2016-05-06 NOTE — Assessment & Plan Note (Signed)
Mild persistent, for depomedrol 80 IM, and predpac asd,  to f/u any worsening symptoms or concerns

## 2016-05-06 NOTE — Patient Instructions (Signed)
You had the steroid shot today  Please take all new medication as prescribed - the prednisone  Please make your appt with ENT in [place] as you mentioned  Please continue all other medications as before, and refills have been done if requested.  Please have the pharmacy call with any other refills you may need.  Please keep your appointments with your specialists as you may have planned  Please go to the XRAY Department in the Basement (go straight as you get off the elevator) for the x-ray testing  You will be contacted by phone if any changes need to be made immediately.  Otherwise, you will receive a letter about your results with an explanation, but please check with MyChart first.  Please remember to sign up for MyChart if you have not done so, as this will be important to you in the future with finding out test results, communicating by private email, and scheduling acute appointments online when needed.

## 2016-06-06 ENCOUNTER — Ambulatory Visit: Payer: Medicare Other | Admitting: Internal Medicine

## 2016-06-06 DIAGNOSIS — J310 Chronic rhinitis: Secondary | ICD-10-CM | POA: Diagnosis not present

## 2016-06-06 DIAGNOSIS — H6121 Impacted cerumen, right ear: Secondary | ICD-10-CM | POA: Diagnosis not present

## 2016-06-06 DIAGNOSIS — H6981 Other specified disorders of Eustachian tube, right ear: Secondary | ICD-10-CM | POA: Diagnosis not present

## 2016-06-10 ENCOUNTER — Encounter: Payer: Self-pay | Admitting: Internal Medicine

## 2016-06-10 ENCOUNTER — Ambulatory Visit (INDEPENDENT_AMBULATORY_CARE_PROVIDER_SITE_OTHER): Payer: Medicare Other | Admitting: Internal Medicine

## 2016-06-10 VITALS — BP 126/84 | HR 78 | Ht 61.5 in | Wt 112.0 lb

## 2016-06-10 DIAGNOSIS — I10 Essential (primary) hypertension: Secondary | ICD-10-CM | POA: Diagnosis not present

## 2016-06-10 DIAGNOSIS — J441 Chronic obstructive pulmonary disease with (acute) exacerbation: Secondary | ICD-10-CM

## 2016-06-10 DIAGNOSIS — J019 Acute sinusitis, unspecified: Secondary | ICD-10-CM | POA: Diagnosis not present

## 2016-06-10 MED ORDER — LEVOFLOXACIN 250 MG PO TABS: 250.0000 mg | ORAL_TABLET | Freq: Every day | ORAL | 0 refills | Status: AC

## 2016-06-10 MED ORDER — PREDNISONE 10 MG PO TABS: ORAL_TABLET | ORAL | 0 refills | Status: DC

## 2016-06-10 MED ORDER — METHYLPREDNISOLONE ACETATE 80 MG/ML IJ SUSP
80.0000 mg | Freq: Once | INTRAMUSCULAR | Status: AC
  Administered 2016-06-10: 80 mg via INTRAMUSCULAR

## 2016-06-10 MED ORDER — CELECOXIB 100 MG PO CAPS: 100.0000 mg | ORAL_CAPSULE | Freq: Two times a day (BID) | ORAL | 5 refills | Status: DC | PRN

## 2016-06-10 NOTE — Progress Notes (Addendum)
Subjective:    Patient ID: Kimberly [name], female    DOB: 1945-03-04, 71 y.o.   MRN: [id number]  HPI   Here with 2-3 days acute onset fever, facial pain, pressure, headache, general weakness and malaise, and greenish d/c, with mild ST and cough, but pt denies chest pain, wheezing, increased sob or doe, orthopnea, PND, increased LE swelling, palpitations, dizziness or syncope, except for onset worsening sob and doe since last PM.  Pt denies new neurological symptoms such as new headache, or facial or extremity weakness or numbness   Pt denies polydipsia, polyuria Past Medical History:  Diagnosis Date  . Anxiety   . Atrial tachycardia ([place])   . Bursitis of shoulder, right, adhesive   . CHF (congestive heart failure) ([place])   . Chronic bronchitis ([place])    "1-2 times/yr" (01/23/2014)  . COPD (chronic obstructive pulmonary disease) ([place])   . CVD (cerebrovascular disease)   . Dyslipidemia   . Dysrhythmia   . Frequency of urination   . GERD (gastroesophageal reflux disease)   . Heart murmur   . History of stomach ulcers   . HTN (hypertension) 02/22/2011  . Migraines    "stopped many years ago" (06/14/2014)  . Pericarditis   . Pneumonia "10 times" (06/14/2014)  . 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 Silent myocardial infarction ([place]) "late 1990's"  . Stress incontinence    "was suppose to have been tacked up years ago but I didn't do it"  . Syncope, near    Associated with atrial tachycardia-event recorder 1/16  . Thoracic outlet syndrome   . VSD (ventricular septal defect)    Past Surgical History:  Procedure Laterality Date  . CARDIAC CATHETERIZATION  "quite a few"  . [place]  . CHOLECYSTECTOMY OPEN  1970's  . CORONARY ANGIOGRAM  2000   No significant CAD  . ELECTROPHYSIOLOGIC STUDY N/A 06/14/2014   Procedure: A-Flutter/A-Tach/SVT Ablation;  Surgeon: Evans Lance, MD;  Location: [facility];  Service: Cardiovascular;   Laterality: N/A;  . MYRINGOTOMY WITH TUBE PLACEMENT Right 2015  . SVT ABLATION  06/14/2014  . TUBAL LIGATION  1972  . VSD REPAIR  1958; 1967    reports that she has been smoking Cigarettes.  She has a 11.55 pack-year smoking history. She has never used smokeless tobacco. She reports that she drinks alcohol. She reports that she does not use drugs. family history includes Alcohol abuse in her other; Arthritis in her other and other; Cancer in her father, mother, and other; Hypertension in her other; Stroke in her other. Allergies  Allergen Reactions  . Codeine     Nausea    Current Outpatient Prescriptions on File Prior to Visit  Medication Sig Dispense Refill  . albuterol (VENTOLIN HFA) 108 (90 Base) MCG/ACT inhaler Inhale 2 puffs into the lungs every 6 (six) hours as needed for wheezing or shortness of breath. 18 g 11  . ALPRAZolam (XANAX) 0.25 MG tablet Take 1 tablet (0.25 mg total) by mouth 2 (two) times daily as needed for anxiety. 60 tablet 2  . budesonide-formoterol (SYMBICORT) 160-4.5 MCG/ACT inhaler Inhale 2 puffs into the lungs 2 (two) times daily as needed (wheezing and sob).    Marland Kitchen buPROPion (WELLBUTRIN XL) 150 MG 24 hr tablet Take 1 tablet (150 mg total) by mouth daily. 90 tablet 3  . citalopram (CELEXA) 20 MG tablet Take 1 tablet (20 mg total) by mouth daily. 90 tablet 3  .  furosemide (LASIX) 20 MG tablet Take 1 tablet (20 mg total) by mouth daily as needed. 30 tablet 11  . lisinopril (PRINIVIL,ZESTRIL) 2.5 MG tablet     . predniSONE (DELTASONE) 10 MG tablet 3 tabs by mouth per day for 3 days,2tabs per day for 3 days,1tab per day for 3 days 18 tablet 0  . rosuvastatin (CRESTOR) 10 MG tablet TAKE 1 TABLET BY MOUTH EVERY DAY. 90 tablet 3   No current facility-administered medications on file prior to visit.    Review of Systems  Constitutional: Negative for other unusual diaphoresis or sweats HENT: Negative for ear discharge or swelling Eyes: Negative for other worsening visual  disturbances Respiratory: Negative for stridor or other swelling  Gastrointestinal: Negative for worsening distension or other blood Genitourinary: Negative for retention or other urinary change Musculoskeletal: Negative for other MSK pain or swelling Skin: Negative for color change or other new lesions Neurological: Negative for worsening tremors and other numbness  Psychiatric/Behavioral: Negative for worsening agitation or other fatigue All other system neg per pt    Objective:   Physical Exam BP 126/84   Pulse 78   Ht 5' 1.5" (1.562 m)   Wt 112 lb (50.8 kg)   SpO2 98%   BMI 20.82 kg/m  VS noted, mild ill appaering Constitutional: Pt appears in NAD HENT: Head: NCAT.  Right Ear: External ear normal.  Left Ear: External ear normal.  Eyes: . Pupils are equal, round, and reactive to light. Conjunctivae and EOM are normal Nose: without d/c or deformity Bilat tm's with mild erythema.  Max sinus areas mild tender.  Pharynx with mild erythema, no exudate Neck: Neck supple. Gross normal ROM Cardiovascular: Normal rate and regular rhythm.   Pulmonary/Chest: Effort normal and breath sounds moderately decresaed without rales or accessory muscle use, but has pursed lip breathing and midl scattered wheezing.  Neurological: Pt is alert. At baseline orientation, motor grossly intact Skin: Skin is warm. No rashes, other new lesions, no LE edema Psychiatric: Pt behavior is normal without agitation  No other exam findings       Assessment & Plan:

## 2016-06-10 NOTE — Patient Instructions (Signed)
You had the steroid shot today  Please take all new medication as prescribed - the antibiotic, and prednisone  Please continue all other medications as before, including the cough medicine that have at home  Please have the pharmacy call with any other refills you may need.  Please keep your appointments with your specialists as you may have planned

## 2016-06-15 ENCOUNTER — Encounter: Payer: Self-pay | Admitting: Internal Medicine

## 2016-06-15 NOTE — Assessment & Plan Note (Signed)
Mild to mod, for antibx course,  to f/u any worsening symptoms or concerns 

## 2016-06-15 NOTE — Assessment & Plan Note (Signed)
stable overall by history and exam, recent data reviewed with pt, and pt to continue medical treatment as before,  to f/u any worsening symptoms or concerns BP Readings from Last 3 Encounters:  06/10/16 126/84  05/06/16 110/76  04/10/16 116/74

## 2016-06-15 NOTE — Assessment & Plan Note (Signed)
Mild to mod, for depomedrol IM 80, predpac asd, cont inhalers,  to f/u any worsening symptoms or concerns 

## 2016-06-17 ENCOUNTER — Telehealth: Payer: Self-pay | Admitting: Internal Medicine

## 2016-06-17 MED ORDER — METHYLPREDNISOLONE 4 MG PO TBPK: ORAL_TABLET | ORAL | 0 refills | Status: DC

## 2016-06-17 NOTE — Telephone Encounter (Signed)
Pt states she has been taking predniSONE (DELTASONE) 10 MG tablet, pt states her HR has been very high, and she feels very shaky. She will not be taking anymore of this. Would like something else.   Same pharmacy

## 2016-06-17 NOTE — Telephone Encounter (Signed)
It is very likelihood that prednisone is doing this and more likely her illness instead,  but will change to medrol Dose pac - done erx

## 2016-07-02 NOTE — Telephone Encounter (Signed)
Pt called in and is very upset because she stated she never rec'd a called that the predisone was called in back in may.  She wants to know why she was never told about it and would like some to call her asap    Best number [phone]

## 2016-07-03 NOTE — Telephone Encounter (Signed)
Ok, sure, this should be ok

## 2016-07-03 NOTE — Telephone Encounter (Signed)
I informed pt of your response and she stated that her breathing is not good but it is not bad. She stated that she would like to go ahead and start it just to make sure.

## 2016-07-03 NOTE — Telephone Encounter (Signed)
Ok to hold on taking the medrol for now, as it is not needed if she is doing ok with sob/doe; thanks

## 2016-07-03 NOTE — Telephone Encounter (Signed)
Spoke with the patient and apologized that she did not receive a call from me. I told her I did see the message in her chart where it was sent to me back in may and explained that it had possible gotten deleted in the mist of me clearing out msgs that I had already responded to for other patients. She understood once I explained the situation and assured her that I was Dr. Judi Cong nurse/assistant. She wanted to know if she should start the dose pack now? Please advise.   [phone] cell

## 2016-07-07 ENCOUNTER — Telehealth: Payer: Self-pay | Admitting: Internal Medicine

## 2016-07-07 DIAGNOSIS — J441 Chronic obstructive pulmonary disease with (acute) exacerbation: Secondary | ICD-10-CM

## 2016-07-07 MED ORDER — HYDROCOD POLST-CPM POLST ER 10-8 MG/5ML PO SUER: 5.0000 mL | Freq: Two times a day (BID) | ORAL | 0 refills | Status: DC | PRN

## 2016-07-07 NOTE — Telephone Encounter (Signed)
Pt lives in [place] but will be coming to [place] today for another appointment, can she get a refill of chlorpheniramine-HYDROcodone [facility]) 10-8 MG/5ML Latanya Presser [[id number]]   Please advise. I know Jenny Reichmann is not here Pt is aware of Jenny Reichmann being off and is hoping another doctor can prescribe this for her since she will be in town

## 2016-07-07 NOTE — Telephone Encounter (Signed)
Are you ok with filling this----or does she need another appt----please advise, thanks

## 2016-07-07 NOTE — Telephone Encounter (Signed)
Patient advised to pick up rx at front desk----[facility] has approved request

## 2016-07-16 ENCOUNTER — Telehealth: Payer: Self-pay | Admitting: Internal Medicine

## 2016-07-16 MED ORDER — ALPRAZOLAM 0.25 MG PO TABS: 0.2500 mg | ORAL_TABLET | Freq: Two times a day (BID) | ORAL | 2 refills | Status: DC | PRN

## 2016-07-16 NOTE — Telephone Encounter (Signed)
Done hardcopy to [place]  

## 2016-07-16 NOTE — Telephone Encounter (Signed)
Pt would like a refill of ALPRAZolam (XANAX) 0.25 MG tablet   [facility] [address] in [place]

## 2016-07-16 NOTE — Telephone Encounter (Signed)
Faxed

## 2016-07-24 ENCOUNTER — Telehealth: Payer: Self-pay | Admitting: Internal Medicine

## 2016-07-24 ENCOUNTER — Other Ambulatory Visit (INDEPENDENT_AMBULATORY_CARE_PROVIDER_SITE_OTHER): Payer: Medicare Other

## 2016-07-24 ENCOUNTER — Ambulatory Visit (INDEPENDENT_AMBULATORY_CARE_PROVIDER_SITE_OTHER): Payer: Medicare Other | Admitting: Internal Medicine

## 2016-07-24 VITALS — BP 118/82 | HR 91 | Ht 61.5 in | Wt 115.0 lb

## 2016-07-24 DIAGNOSIS — R233 Spontaneous ecchymoses: Secondary | ICD-10-CM

## 2016-07-24 DIAGNOSIS — E2839 Other primary ovarian failure: Secondary | ICD-10-CM

## 2016-07-24 DIAGNOSIS — R238 Other skin changes: Secondary | ICD-10-CM | POA: Diagnosis not present

## 2016-07-24 DIAGNOSIS — Z0001 Encounter for general adult medical examination with abnormal findings: Secondary | ICD-10-CM | POA: Diagnosis not present

## 2016-07-24 DIAGNOSIS — M25561 Pain in right knee: Secondary | ICD-10-CM

## 2016-07-24 DIAGNOSIS — S81811A Laceration without foreign body, right lower leg, initial encounter: Secondary | ICD-10-CM | POA: Diagnosis not present

## 2016-07-24 DIAGNOSIS — I8393 Asymptomatic varicose veins of bilateral lower extremities: Secondary | ICD-10-CM | POA: Diagnosis not present

## 2016-07-24 DIAGNOSIS — Z1159 Encounter for screening for other viral diseases: Secondary | ICD-10-CM

## 2016-07-24 LAB — PROTIME-INR
INR: 1.1 ratio — AB (ref 0.8–1.0)
Prothrombin Time: 12.4 s (ref 9.6–13.1)

## 2016-07-24 MED ORDER — ASPIRIN 81 MG PO TABS: 81.0000 mg | ORAL_TABLET | Freq: Every day | ORAL | 99 refills | Status: AC

## 2016-07-24 NOTE — Assessment & Plan Note (Signed)

## 2016-07-24 NOTE — Assessment & Plan Note (Signed)
Numerous bruising to extremities, for INR and plt today,  to f/u any worsening symptoms or concerns

## 2016-07-24 NOTE — Assessment & Plan Note (Signed)
Numerous varicosities to bilat LE's, no phlebitis, declines vein clinic referral

## 2016-07-24 NOTE — Telephone Encounter (Signed)
Kimberly [name] is calling about her bruising on her skin . The veins in her legs are popping . Wants to know should she see Dr. Harrington Challenger or go see a  Vein Specialist . Please call

## 2016-07-24 NOTE — Assessment & Plan Note (Addendum)
/   bakers cyst? For referral sports med  In addition to the time spent performing CPE, I spent an additional 15 minutes face to face,in which greater than 50% of this time was spent in counseling and coordination of care for patient's illness as documented, including the differential dx, evaluation and tx of right knee pain, varicose veins, easy bruising and skin laceration

## 2016-07-24 NOTE — Patient Instructions (Signed)
Ok to continue the aspirin 81 mg per day  Please schedule the bone density test before leaving today at the scheduling desk (where you check out)  You will be contacted regarding the referral for: orthopedic - Dr Tamala Julian - or you can make an appt as you leave at the checkout desk  Please go to the LAB in the Basement (turn left off the elevator) for the tests to be done today  You will be contacted by phone if any changes need to be made immediately.  Otherwise, you will receive a letter about your results with an explanation, but please check with MyChart first.  Please remember to sign up for MyChart if you have not done so, as this will be important to you in the future with finding out test results, communicating by private email, and scheduling acute appointments online when needed.  Please return in 6 months, or sooner if needed

## 2016-07-24 NOTE — Telephone Encounter (Signed)
Pt calling to inform Dr Harrington Challenger and RN that yesterday while shopping, she had a "superficial vein in her lower extremity, ankle area, to pop open." Pt states "as odd as it sounds, I was walking in a pet store, and felt something wet in my shoe, and noticed that one of my veins popped open." Pt states that Dr Harrington Challenger is very familiar with her history and is very aware of her having bad veins and having the issue with bruising easily. Pt states that she is only on ASA daily, even though this is not listed in the pts med list.  Pt states Dr Harrington Challenger advised her to take ASA 81 mg po daily.  Pt states that Dr Harrington Challenger is aware of her "spider vein issue." Pt states that bleeding is controlled and she has good circulation in that extremity.  Pt states she is in no acute distress at all, and has been able to do all normal ADL this morning.  Pt states she was just calling to let Dr Harrington Challenger know this, and obtain an appt, for referral to a Vein Specialist.  Pt is aware that Dr Harrington Challenger is out of the office today, so I advised her that being she is in no acute distress, she should see about getting into see her PCP Dr Jenny Reichmann today, to rule out any acute findings in that area.  Pt did call and obtained an appt with her PCP Dr Jenny Reichmann, for today at 2 pm, for complaint mentioned. Pt still would like to see Dr Harrington Challenger as well.  Informed the pt that Dr Harrington Challenger has an opening from a previous cancellation on her schedule, for next Monday 7/9 at 4:20 pm.  Pt accepted this appt.  Advised the pt to arrive 15 mins prior too this appt.  Pt verbalized understanding and agrees with this plan.   Educated the pt on hemorrhage control, if that area in her leg were to start bleeding again.  Advised the pt to seek immediate medical assistance as well, if this reoccurs between now and 2 pm.  Informed the pt that I will route this message to both Dr Harrington Challenger and RN for their review, upon return to the office.  Pt verbalized understanding and agrees with this plan.  Pt  more than gracious for all the assistance provided.

## 2016-07-24 NOTE — Assessment & Plan Note (Signed)
Very small, no evidence for foreign body or bleeding, no specific tx needed

## 2016-07-24 NOTE — Progress Notes (Signed)
Subjective:    Patient ID: Kimberly [name], female    DOB: 1946-01-15, 71 y.o.   MRN: [id number]  HPI  Here for wellness and f/u;  Overall doing ok;  Pt denies Chest pain, worsening SOB, DOE, wheezing, orthopnea, PND, worsening LE edema, palpitations, dizziness or syncope.  Pt denies neurological change such as new headache, facial or extremity weakness.  Pt denies polydipsia, polyuria, or low sugar symptoms. Pt states overall good compliance with treatment and medications, good tolerability, and has been trying to follow appropriate diet.  Pt denies worsening depressive symptoms, suicidal ideation or panic. No fever, night sweats, wt loss, loss of appetite, or other constitutional symptoms.  Pt states good ability with ADL's, has low fall risk, home safety reviewed and adequate, no other significant changes in hearing or vision, and not active with exercise. Does c/o multiple easy bruising for minor bumps and dog scratches and one small round laceration to right mid pretibial that "took forever" to stop bleeding. That one started after walking a dog.  No hx of bleeding diathesis or other overt blood loss.   Also with new onset right knee pain and swelling posteriorly with a sort of egg shape that is not present on left, mild tender, no overt right knee effusion.  Also with numerous varicose veins to bilet LE's but no pain Past Medical History:  Diagnosis Date  . Anxiety   . Atrial tachycardia ([place])   . Bursitis of shoulder, right, adhesive   . CHF (congestive heart failure) ([place])   . Chronic bronchitis ([place])    "1-2 times/yr" (01/23/2014)  . COPD (chronic obstructive pulmonary disease) ([place])   . CVD (cerebrovascular disease)   . Dyslipidemia   . Dysrhythmia   . Frequency of urination   . GERD (gastroesophageal reflux disease)   . Heart murmur   . History of stomach ulcers   . HTN (hypertension) 02/22/2011  . Migraines    "stopped many years ago" (06/14/2014)  . Pericarditis   . Pneumonia "10  times" (06/14/2014)  . 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 Silent myocardial infarction ([place]) "late 1990's"  . Stress incontinence    "was suppose to have been tacked up years ago but I didn't do it"  . Syncope, near    Associated with atrial tachycardia-event recorder 1/16  . Thoracic outlet syndrome   . VSD (ventricular septal defect)    Past Surgical History:  Procedure Laterality Date  . CARDIAC CATHETERIZATION  "quite a few"  . [place]  . CHOLECYSTECTOMY OPEN  1970's  . CORONARY ANGIOGRAM  2000   No significant CAD  . ELECTROPHYSIOLOGIC STUDY N/A 06/14/2014   Procedure: A-Flutter/A-Tach/SVT Ablation;  Surgeon: Evans Lance, MD;  Location: [facility];  Service: Cardiovascular;  Laterality: N/A;  . MYRINGOTOMY WITH TUBE PLACEMENT Right 2015  . SVT ABLATION  06/14/2014  . TUBAL LIGATION  1972  . VSD REPAIR  1958; 1967    reports that she has been smoking Cigarettes.  She has a 11.55 pack-year smoking history. She has never used smokeless tobacco. She reports that she drinks alcohol. She reports that she does not use drugs. family history includes Alcohol abuse in her other; Arthritis in her other and other; Cancer in her father, mother, and other; Hypertension in her other; Stroke in her other. Allergies  Allergen Reactions  . Codeine     Nausea    Current Outpatient Prescriptions on File Prior  to Visit  Medication Sig Dispense Refill  . albuterol (VENTOLIN HFA) 108 (90 Base) MCG/ACT inhaler Inhale 2 puffs into the lungs every 6 (six) hours as needed for wheezing or shortness of breath. 18 g 11  . ALPRAZolam (XANAX) 0.25 MG tablet Take 1 tablet (0.25 mg total) by mouth 2 (two) times daily as needed for anxiety. 60 tablet 2  . budesonide-formoterol (SYMBICORT) 160-4.5 MCG/ACT inhaler Inhale 2 puffs into the lungs 2 (two) times daily as needed (wheezing and sob).    Marland Kitchen buPROPion (WELLBUTRIN XL) 150 MG 24 hr tablet  Take 1 tablet (150 mg total) by mouth daily. 90 tablet 3  . celecoxib (CELEBREX) 100 MG capsule Take 1 capsule (100 mg total) by mouth 2 (two) times daily as needed. 60 capsule 5  . citalopram (CELEXA) 20 MG tablet Take 1 tablet (20 mg total) by mouth daily. 90 tablet 3  . furosemide (LASIX) 20 MG tablet Take 1 tablet (20 mg total) by mouth daily as needed. 30 tablet 11  . rosuvastatin (CRESTOR) 10 MG tablet TAKE 1 TABLET BY MOUTH EVERY DAY. 90 tablet 3   No current facility-administered medications on file prior to visit.    Review of Systems Constitutional: Negative for other unusual diaphoresis, sweats, appetite or weight changes HENT: Negative for other worsening hearing loss, ear pain, facial swelling, mouth sores or neck stiffness.   Eyes: Negative for other worsening pain, redness or other visual disturbance.  Respiratory: Negative for other stridor or swelling Cardiovascular: Negative for other palpitations or other chest pain  Gastrointestinal: Negative for worsening diarrhea or loose stools, blood in stool, distention or other pain Genitourinary: Negative for hematuria, flank pain or other change in urine volume.  Musculoskeletal: Negative for myalgias or other joint swelling.  Skin: Negative for other color change, or other wound or worsening drainage.  Neurological: Negative for other syncope or numbness. Hematological: Negative for other adenopathy or swelling Psychiatric/Behavioral: Negative for hallucinations, other worsening agitation, SI, self-injury, or new decreased concentration All other system neg per pt    Objective:   Physical Exam BP 118/82   Pulse 91   Ht 5' 1.5" (1.562 m)   Wt 115 lb (52.2 kg)   SpO2 98%   BMI 21.38 kg/m  VS noted,  Constitutional: Pt is oriented to person, place, and time. Appears well-developed and well-nourished, in no significant distress and comfortable Head: Normocephalic and atraumatic  Eyes: Conjunctivae and EOM are normal. Pupils  are equal, round, and reactive to light Right Ear: External ear normal without discharge Left Ear: External ear normal without discharge Nose: Nose without discharge or deformity Mouth/Throat: Oropharynx is without other ulcerations and moist  Neck: Normal range of motion. Neck supple. No JVD present. No tracheal deviation present or significant neck LA or mass Cardiovascular: Normal rate, regular rhythm, normal heart sounds and intact distal pulses.   Pulmonary/Chest: WOB normal and breath sounds decreased without rales or wheezing  Abdominal: Soft. Bowel sounds are normal. NT. No HSM  Musculoskeletal: Normal range of motion. Exhibits no edema, right post knee with local raised swelling area mild tender without skin change Lymphadenopathy: Has no other cervical adenopathy.  Neurological: Pt is alert and oriented to person, place, and time. Pt has normal reflexes. No cranial nerve deficit. Motor grossly intact, Gait intact Skin: Skin is warm and dry. No rash noted or new ulcerations but has numerous various size but < 1 cm bruises to all extremities Psychiatric:  Has normal mood and affect. Behavior  is normal without agitation No other exam findings Lab Results  Component Value Date   WBC 16.5 (H) 04/08/2016   HGB 13.6 04/08/2016   HCT 39.7 04/08/2016   PLT 191 04/08/2016   GLUCOSE 103 (H) 04/08/2016   CHOL 141 04/08/2016   TRIG 55 04/08/2016   HDL 65 04/08/2016   LDLDIRECT 112.6 03/16/2013   LDLCALC 65 04/08/2016   ALT 20 04/08/2016   AST 26 04/08/2016   NA 132 (L) 04/08/2016   K 3.5 04/08/2016   CL 98 (L) 04/08/2016   CREATININE 0.72 04/08/2016   BUN 18 04/08/2016   CO2 24 04/08/2016   TSH 0.606 04/08/2016   INR 1.1 (H) 07/24/2016       Assessment & Plan:

## 2016-07-25 ENCOUNTER — Other Ambulatory Visit (INDEPENDENT_AMBULATORY_CARE_PROVIDER_SITE_OTHER): Payer: Medicare Other

## 2016-07-25 DIAGNOSIS — Z0001 Encounter for general adult medical examination with abnormal findings: Secondary | ICD-10-CM | POA: Diagnosis not present

## 2016-07-25 LAB — CBC WITH DIFFERENTIAL/PLATELET
BASOS ABS: 0 10*3/uL (ref 0.0–0.1)
Basophils Relative: 0.7 % (ref 0.0–3.0)
EOS ABS: 0 10*3/uL (ref 0.0–0.7)
EOS PCT: 0.6 % (ref 0.0–5.0)
HCT: 41.4 % (ref 36.0–46.0)
HEMOGLOBIN: 13.8 g/dL (ref 12.0–15.0)
LYMPHS ABS: 1.5 10*3/uL (ref 0.7–4.0)
Lymphocytes Relative: 24 % (ref 12.0–46.0)
MCHC: 33.4 g/dL (ref 30.0–36.0)
MCV: 91.8 fl (ref 78.0–100.0)
MONO ABS: 0.5 10*3/uL (ref 0.1–1.0)
Monocytes Relative: 7.3 % (ref 3.0–12.0)
NEUTROS PCT: 67.4 % (ref 43.0–77.0)
Neutro Abs: 4.3 10*3/uL (ref 1.4–7.7)
Platelets: 210 10*3/uL (ref 150.0–400.0)
RBC: 4.51 Mil/uL (ref 3.87–5.11)
RDW: 15.3 % (ref 11.5–15.5)
WBC: 6.4 10*3/uL (ref 4.0–10.5)

## 2016-07-25 LAB — URINALYSIS, ROUTINE W REFLEX MICROSCOPIC
BILIRUBIN URINE: NEGATIVE
Ketones, ur: NEGATIVE
NITRITE: NEGATIVE
PH: 5.5 (ref 5.0–8.0)
Specific Gravity, Urine: 1.015 (ref 1.000–1.030)
TOTAL PROTEIN, URINE-UPE24: NEGATIVE
URINE GLUCOSE: NEGATIVE
Urobilinogen, UA: 0.2 (ref 0.0–1.0)

## 2016-07-25 LAB — BASIC METABOLIC PANEL
BUN: 12 mg/dL (ref 6–23)
CO2: 30 meq/L (ref 19–32)
Calcium: 9.3 mg/dL (ref 8.4–10.5)
Chloride: 104 mEq/L (ref 96–112)
Creatinine, Ser: 0.87 mg/dL (ref 0.40–1.20)
GFR: 68.53 mL/min (ref >60.00)
GLUCOSE: 105 mg/dL — AB (ref 70–99)
POTASSIUM: 4.2 meq/L (ref 3.5–5.1)
Sodium: 140 mEq/L (ref 135–145)

## 2016-07-25 LAB — HEPATIC FUNCTION PANEL
ALBUMIN: 3.9 g/dL (ref 3.5–5.2)
ALK PHOS: 62 U/L (ref 39–117)
ALT: 13 U/L (ref 0–35)
AST: 16 U/L (ref 0–37)
Bilirubin, Direct: 0.2 mg/dL (ref 0.0–0.3)
Total Bilirubin: 1 mg/dL (ref 0.2–1.2)
Total Protein: 6.5 g/dL (ref 6.0–8.3)

## 2016-07-25 LAB — LIPID PANEL
CHOLESTEROL: 150 mg/dL (ref 0–200)
HDL: 78.5 mg/dL (ref >39.00)
LDL CALC: 61 mg/dL (ref 0–99)
NonHDL: 71.62
TRIGLYCERIDES: 55 mg/dL (ref 0.0–149.0)
Total CHOL/HDL Ratio: 2
VLDL: 11 mg/dL (ref 0.0–40.0)

## 2016-07-25 LAB — TSH: TSH: 0.99 u[IU]/mL (ref 0.35–4.50)

## 2016-07-25 LAB — HEPATITIS C ANTIBODY: HCV AB: NEGATIVE

## 2016-07-25 NOTE — Telephone Encounter (Signed)
New message    Pt is calling stating that she saw Dr. Jenny Reichmann yesterday and she states that he said she does not need appt on Monday. She said she is going to see Dr. Tamala Julian on the 30th.

## 2016-07-25 NOTE — Telephone Encounter (Signed)
Pt calling to give Dr Harrington Challenger a report from her visit with her PCP from yesterday, for complaints mentioned.   Pt reports that Dr Jenny Reichmann assessed her affected leg yesterday and noted that the pt has a Bakers Cyst in the back of her leg, which is causing her leg to swell and causing her vessels to appear larger than they really are.  Pt reports that Dr Jenny Reichmann has referred her to Dr Tamala Julian, an Orthopedic MD, who specializes in removal of Bakers Cyst.  Pt states that appt will be on 7/30, so she can cancel her scheduled appt with Dr Harrington Challenger on this coming up Monday 7/9.  Pt reports that Dr Tamala Julian will also do a bone density scan on the pt at that visit.  Pt gracious for all the assistance provided, and would like for this update to be sent to Dr Harrington Challenger for her review.

## 2016-07-28 ENCOUNTER — Ambulatory Visit: Payer: Medicare Other | Admitting: Internal Medicine

## 2016-08-13 DIAGNOSIS — L0390 Cellulitis, unspecified: Secondary | ICD-10-CM | POA: Diagnosis not present

## 2016-08-17 NOTE — Progress Notes (Signed)
[facility] [address], [place] [address] Phone: [phone] Subjective:    I'm seeing this patient by the request  of:  Biagio Borg, MD   CC: Right knee pain and swelling  URK:[id number]  ONIYAH ROHE is a 71 y.o. female coming in with complaint of right knee pain. Patient has had some swelling of the back or knee. States that it seems to come and go. Sometimes does decrease her range of motion. Tries to stay active. Denies any giving out on her. Patient rates the severity of pain is 3 out of 10 most of the time. Once to know what it isn't effacing can be done. Not remember any true injury  Left leg pain. Patient fell while walking her dog. Had a scraping went to urgent care. Patient has been on antibiotics since then. Has been completed daily.      Past Medical History:  Diagnosis Date  . Anxiety   . Atrial tachycardia ([place])   . Bursitis of shoulder, right, adhesive   . CHF (congestive heart failure) ([place])   . Chronic bronchitis ([place])    "1-2 times/yr" (01/23/2014)  . COPD (chronic obstructive pulmonary disease) ([place])   . CVD (cerebrovascular disease)   . Dyslipidemia   . Dysrhythmia   . Frequency of urination   . GERD (gastroesophageal reflux disease)   . Heart murmur   . History of stomach ulcers   . HTN (hypertension) 02/22/2011  . Migraines    "stopped many years ago" (06/14/2014)  . Pericarditis   . Pneumonia "10 times" (06/14/2014)  . 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 Silent myocardial infarction ([place]) "late 1990's"  . Stress incontinence    "was suppose to have been tacked up years ago but I didn't do it"  . Syncope, near    Associated with atrial tachycardia-event recorder 1/16  . Thoracic outlet syndrome   . VSD (ventricular septal defect)    Past Surgical History:  Procedure Laterality Date  . CARDIAC CATHETERIZATION  "quite a few"  . [place]  .  CHOLECYSTECTOMY OPEN  1970's  . CORONARY ANGIOGRAM  2000   No significant CAD  . ELECTROPHYSIOLOGIC STUDY N/A 06/14/2014   Procedure: A-Flutter/A-Tach/SVT Ablation;  Surgeon: Evans Lance, MD;  Location: [facility];  Service: Cardiovascular;  Laterality: N/A;  . MYRINGOTOMY WITH TUBE PLACEMENT Right 2015  . SVT ABLATION  06/14/2014  . TUBAL LIGATION  1972  . VSD REPAIR  1958; 84   Social History   Social History  . Marital status: Married    Spouse name: N/A  . Number of children: N/A  . Years of education: 76   Occupational History  . retired    Social History Main Topics  . Smoking status: Current Every Day Smoker    Packs/day: 0.33    Years: 35.00    Types: Cigarettes  . Smokeless tobacco: Never Used     Comment: Smoked 1971-present , up to 1/2 ppd. Intermittent smoking cessation with preganancy up to 1/2-1 year @ a time  . Alcohol use Yes     Comment: 06/14/2014 "glass of wine maybe once/month"  . Drug use: No  . Sexual activity: No   Other Topics Concern  . Not on file   Social History Narrative   Pt lives with husband.   Allergies  Allergen Reactions  . Codeine     Nausea  Family History  Problem Relation Age of Onset  . Cancer Mother   . Cancer Father   . Throat cancer Unknown   . Hypertension Unknown   . Stroke Unknown   . Alcohol abuse Other   . Arthritis Other   . Cancer Other        lung cancer  . Hypertension Other   . Arthritis Other   . Stroke Other   . Breast cancer Neg Hx      Past medical history, social, surgical and family history all reviewed in electronic medical record.  No pertanent information unless stated regarding to the chief complaint.   Review of Systems:Review of systems updated and as accurate as of 08/17/16  No headache, visual changes, nausea, vomiting, diarrhea, constipation, dizziness, abdominal pain, skin rash, fevers, chills, night sweats, weight loss, swollen lymph nodes chest pain, shortness of  breath, mood change Positive body aches and muscle aches  Blood pressure 120/82, pulse 70, height 5\' 1"  (1.549 m), weight 118 lb (53.5 kg).   General: No apparent distress alert and oriented x3 mood and affect normal, dressed appropriately.  HEENT: Pupils equal, extraocular movements intact  Respiratory: Patient's speak in full sentences and does not appear short of breath  Cardiovascular: No lower extremity edema, non tender, no erythema  Skin: Warm dry intact with no signs of infection or rash on extremities or on axial skeleton.  Abdomen: Soft nontender  Neuro: Cranial nerves II through XII are intact, neurovascularly intact in all extremities with 2+ DTRs and 2+ pulses.  Lymph: No lymphadenopathy of posterior or anterior cervical chain or axillae bilaterally.  Gait normal with good balance and coordination.  MSK:  Non tender with full range of motion and good stability and symmetric strength and tone of shoulders, elbows, wrist, hip, and ankles bilaterally.  Knee: right knee  valgus deformity noted.  mild tenderness to palpation full in flexion and extension and lower leg rotation. instability with valgus force.  mild overall  painful patellar compression. Patellar glide with moderate crepitus. Patellar and quadriceps tendons unremarkable. Hamstring and quadriceps strength is normal. Contralateral knee shows with mild arthritic changes.  Left leg shows the patient has had 2 abrasions noted on the posterior calf. No signs of any infection. Good granulation tissue. Largest one measures approximately quarter size    Impression and Recommendations:     This case required medical decision making of moderate complexity.      Note: This dictation was prepared with Dragon dictation along with smaller phrase technology. Any transcriptional errors that result from this process are unintentional.

## 2016-08-18 ENCOUNTER — Encounter: Payer: Self-pay | Admitting: Family Medicine

## 2016-08-18 ENCOUNTER — Ambulatory Visit (INDEPENDENT_AMBULATORY_CARE_PROVIDER_SITE_OTHER)
Admission: RE | Admit: 2016-08-18 | Discharge: 2016-08-18 | Disposition: A | Discharge status: Home / Self Care | Payer: Medicare Other | Source: Ambulatory Visit | Attending: Internal Medicine | Admitting: Internal Medicine

## 2016-08-18 ENCOUNTER — Ambulatory Visit: Payer: Self-pay

## 2016-08-18 ENCOUNTER — Ambulatory Visit (INDEPENDENT_AMBULATORY_CARE_PROVIDER_SITE_OTHER): Payer: Medicare Other | Admitting: Family Medicine

## 2016-08-18 VITALS — BP 120/82 | HR 70 | Ht 61.0 in | Wt 118.0 lb

## 2016-08-18 DIAGNOSIS — M1711 Unilateral primary osteoarthritis, right knee: Secondary | ICD-10-CM

## 2016-08-18 DIAGNOSIS — E2839 Other primary ovarian failure: Secondary | ICD-10-CM | POA: Diagnosis not present

## 2016-08-18 DIAGNOSIS — M25561 Pain in right knee: Secondary | ICD-10-CM

## 2016-08-18 NOTE — Patient Instructions (Signed)
Good to see you  You will do great  pennsaid pinkie amount topically 2 times daily as needed.  Ice 20 minutes 2 times daily. Usually after activity and before bed. Exercises 3 times a week.  Vitamin D 2000 IU daily  Knee compression sleeve daily on the right knee but do not wear it at night See me again in 3-4 weeks

## 2016-08-18 NOTE — Assessment & Plan Note (Signed)
Patient does have arthritic changes of the knee. Likely contributing to most small Baker cyst. Patient declined aspiration. Home exercises given today, discussed icing regimen, patient will try conservative therapy with topical anti-inflammatories. Follow-up again in 4-6 weeks.

## 2016-08-19 ENCOUNTER — Encounter: Payer: Self-pay | Admitting: Internal Medicine

## 2016-08-19 ENCOUNTER — Telehealth: Payer: Self-pay

## 2016-08-19 ENCOUNTER — Other Ambulatory Visit: Payer: Self-pay | Admitting: Internal Medicine

## 2016-08-19 DIAGNOSIS — M810 Age-related osteoporosis without current pathological fracture: Secondary | ICD-10-CM

## 2016-08-19 HISTORY — DX: Age-related osteoporosis without current pathological fracture: M81.0

## 2016-08-19 MED ORDER — ALENDRONATE SODIUM 70 MG PO TABS: 70.0000 mg | ORAL_TABLET | ORAL | 3 refills | Status: DC

## 2016-08-19 NOTE — Telephone Encounter (Signed)
-----   Message from Biagio Borg, MD sent at 08/19/2016 12:11 PM EDT ----- Left message on MyChart, pt to cont same tx except  The test results show that your current treatment is OK, except the test is consistent with the finding of severe Osteoporosis.  We would like to have you start foxamax 70 mg per week, which can slow this down from getting even worse. Marland KitchenA new prescription will be sent, and you should be called from the office as well    Shirron to please inform pt, I will do rx

## 2016-08-19 NOTE — Telephone Encounter (Signed)
Pt has been informed and expressed understanding.  

## 2016-08-25 ENCOUNTER — Encounter: Payer: Self-pay | Admitting: Physician Assistant

## 2016-08-26 ENCOUNTER — Encounter (HOSPITAL_COMMUNITY): Payer: Medicare Other

## 2016-08-26 ENCOUNTER — Encounter: Payer: Self-pay | Admitting: Internal Medicine

## 2016-08-26 ENCOUNTER — Other Ambulatory Visit (INDEPENDENT_AMBULATORY_CARE_PROVIDER_SITE_OTHER): Payer: Medicare Other

## 2016-08-26 ENCOUNTER — Ambulatory Visit (INDEPENDENT_AMBULATORY_CARE_PROVIDER_SITE_OTHER): Payer: Medicare Other | Admitting: Internal Medicine

## 2016-08-26 VITALS — BP 124/78 | HR 90 | Ht 61.0 in | Wt 121.0 lb

## 2016-08-26 DIAGNOSIS — I5023 Acute on chronic systolic (congestive) heart failure: Secondary | ICD-10-CM

## 2016-08-26 DIAGNOSIS — M7989 Other specified soft tissue disorders: Secondary | ICD-10-CM | POA: Diagnosis not present

## 2016-08-26 DIAGNOSIS — I5022 Chronic systolic (congestive) heart failure: Secondary | ICD-10-CM | POA: Insufficient documentation

## 2016-08-26 DIAGNOSIS — I10 Essential (primary) hypertension: Secondary | ICD-10-CM | POA: Diagnosis not present

## 2016-08-26 DIAGNOSIS — L03116 Cellulitis of left lower limb: Secondary | ICD-10-CM | POA: Diagnosis not present

## 2016-08-26 LAB — BASIC METABOLIC PANEL
BUN: 15 mg/dL (ref 6–23)
CALCIUM: 9 mg/dL (ref 8.4–10.5)
CO2: 31 meq/L (ref 19–32)
Chloride: 103 mEq/L (ref 96–112)
Creatinine, Ser: 0.9 mg/dL (ref 0.40–1.20)
GFR: 65.89 mL/min (ref >60.00)
GLUCOSE: 100 mg/dL — AB (ref 70–99)
POTASSIUM: 4.4 meq/L (ref 3.5–5.1)
Sodium: 139 mEq/L (ref 135–145)

## 2016-08-26 LAB — HEPATIC FUNCTION PANEL
ALBUMIN: 3.9 g/dL (ref 3.5–5.2)
ALK PHOS: 59 U/L (ref 39–117)
ALT: 10 U/L (ref 0–35)
AST: 15 U/L (ref 0–37)
Bilirubin, Direct: 0.2 mg/dL (ref 0.0–0.3)
TOTAL PROTEIN: 6.4 g/dL (ref 6.0–8.3)
Total Bilirubin: 0.9 mg/dL (ref 0.2–1.2)

## 2016-08-26 LAB — BRAIN NATRIURETIC PEPTIDE: Pro B Natriuretic peptide (BNP): 534 pg/mL — ABNORMAL HIGH (ref 0.0–100.0)

## 2016-08-26 LAB — CBC WITH DIFFERENTIAL/PLATELET
BASOS ABS: 0 10*3/uL (ref 0.0–0.1)
Basophils Relative: 0.7 % (ref 0.0–3.0)
EOS ABS: 0.1 10*3/uL (ref 0.0–0.7)
Eosinophils Relative: 1.6 % (ref 0.0–5.0)
HCT: 41.6 % (ref 36.0–46.0)
Hemoglobin: 13.6 g/dL (ref 12.0–15.0)
LYMPHS ABS: 1.5 10*3/uL (ref 0.7–4.0)
Lymphocytes Relative: 24.1 % (ref 12.0–46.0)
MCHC: 32.6 g/dL (ref 30.0–36.0)
MCV: 92.2 fl (ref 78.0–100.0)
MONOS PCT: 8.7 % (ref 3.0–12.0)
Monocytes Absolute: 0.6 10*3/uL (ref 0.1–1.0)
NEUTROS ABS: 4.1 10*3/uL (ref 1.4–7.7)
NEUTROS PCT: 64.9 % (ref 43.0–77.0)
PLATELETS: 217 10*3/uL (ref 150.0–400.0)
RBC: 4.51 Mil/uL (ref 3.87–5.11)
RDW: 14.2 % (ref 11.5–15.5)
WBC: 6.3 10*3/uL (ref 4.0–10.5)

## 2016-08-26 MED ORDER — FUROSEMIDE 20 MG PO TABS: 20.0000 mg | ORAL_TABLET | Freq: Two times a day (BID) | ORAL | 11 refills | Status: DC

## 2016-08-26 MED ORDER — ALBUTEROL SULFATE HFA 108 (90 BASE) MCG/ACT IN AERS: 2.0000 | INHALATION_SPRAY | Freq: Four times a day (QID) | RESPIRATORY_TRACT | 11 refills | Status: DC | PRN

## 2016-08-26 NOTE — Progress Notes (Signed)
Subjective:    Patient ID: Kimberly [name], female    DOB: Jul 16, 1945, 71 y.o.   MRN: [id number]  HPI  Here to f/u.  Was involved in a accident with a dog on a leash who ran between her legs to get out of the door; dog scraped (not bite) the left medial mid calf with a skin tear and much bleeding on asa only.  Applied direct pressure and went to UC in [place].  Tx with topical mupirocin and 1 wk docycyine as the redness was already severe inches about the wound.  Here today as although the redness has improved, still having pain and swelling markedly worse than previous, and has overall gained wt assoc with bilat leg swelling as well. Pt denies chest pain wheezing, orthopnea, PND,  palpitations, dizziness or syncope.  She is convince she now has a mupirocin allergy as she has persistent swelling, pain, burning and sob  - all of which are listed on the side effects of the mupirocin ointment.  TGood compliance with her asa.  Though has run out of lasix, but did take her husbands lasix x 2 days last wk, though now realizes she took 60 mg daily each of the 2 days Wt Readings from Last 3 Encounters:  08/26/16 121 lb (54.9 kg)  08/18/16 118 lb (53.5 kg)  07/24/16 115 lb (52.2 kg)   Past Medical History:  Diagnosis Date  . Anxiety   . Atrial tachycardia ([place])   . Bursitis of shoulder, right, adhesive   . CHF (congestive heart failure) ([place])   . Chronic bronchitis ([place])    "1-2 times/yr" (01/23/2014)  . COPD (chronic obstructive pulmonary disease) ([place])   . CVD (cerebrovascular disease)   . Dyslipidemia   . Dysrhythmia   . Frequency of urination   . GERD (gastroesophageal reflux disease)   . Heart murmur   . History of stomach ulcers   . HTN (hypertension) 02/22/2011  . Migraines    "stopped many years ago" (06/14/2014)  . Osteoporosis 08/19/2016  . Pericarditis   . Pneumonia "10 times" (06/14/2014)  . 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 Silent myocardial  infarction ([place]) "late 1990's"  . Stress incontinence    "was suppose to have been tacked up years ago but I didn't do it"  . Syncope, near    Associated with atrial tachycardia-event recorder 1/16  . Thoracic outlet syndrome   . VSD (ventricular septal defect)    Past Surgical History:  Procedure Laterality Date  . CARDIAC CATHETERIZATION  "quite a few"  . [place]  . CHOLECYSTECTOMY OPEN  1970's  . CORONARY ANGIOGRAM  2000   No significant CAD  . ELECTROPHYSIOLOGIC STUDY N/A 06/14/2014   Procedure: A-Flutter/A-Tach/SVT Ablation;  Surgeon: Evans Lance, MD;  Location: [facility];  Service: Cardiovascular;  Laterality: N/A;  . MYRINGOTOMY WITH TUBE PLACEMENT Right 2015  . SVT ABLATION  06/14/2014  . TUBAL LIGATION  1972  . VSD REPAIR  1958; 1967    reports that she has been smoking Cigarettes.  She has a 11.55 pack-year smoking history. She has never used smokeless tobacco. She reports that she drinks alcohol. She reports that she does not use drugs. family history includes Alcohol abuse in her other; Arthritis in her other and other; Cancer in her father, mother, and other; Hypertension in her other and unknown relative; Stroke in her other and unknown relative; Throat cancer in her unknown  relative. Allergies  Allergen Reactions  . Mupirocin Other (See Comments)    Burning, pain, swelling and sob  . Codeine     Nausea    Current Outpatient Prescriptions on File Prior to Visit  Medication Sig Dispense Refill  . alendronate (FOSAMAX) 70 MG tablet Take 1 tablet (70 mg total) by mouth every 7 (seven) days. Take with a full glass of water on an empty stomach. 12 tablet 3  . ALPRAZolam (XANAX) 0.25 MG tablet Take 1 tablet (0.25 mg total) by mouth 2 (two) times daily as needed for anxiety. 60 tablet 2  . aspirin 81 MG tablet Take 1 tablet (81 mg total) by mouth daily. 100 tablet 99  . budesonide-formoterol (SYMBICORT) 160-4.5 MCG/ACT inhaler Inhale 2 puffs into  the lungs 2 (two) times daily as needed (wheezing and sob).    Marland Kitchen buPROPion (WELLBUTRIN XL) 150 MG 24 hr tablet Take 1 tablet (150 mg total) by mouth daily. 90 tablet 3  . celecoxib (CELEBREX) 100 MG capsule Take 1 capsule (100 mg total) by mouth 2 (two) times daily as needed. 60 capsule 5  . citalopram (CELEXA) 20 MG tablet Take 1 tablet (20 mg total) by mouth daily. 90 tablet 3  . diclofenac (VOLTAREN) 0.1 % ophthalmic solution 4 (four) times daily.    . rosuvastatin (CRESTOR) 10 MG tablet TAKE 1 TABLET BY MOUTH EVERY DAY. 90 tablet 3   No current facility-administered medications on file prior to visit.    Review of Systems  Constitutional: Negative for other unusual diaphoresis or sweats HENT: Negative for ear discharge or swelling Eyes: Negative for other worsening visual disturbances Respiratory: Negative for stridor or other swelling  Gastrointestinal: Negative for worsening distension or other blood Genitourinary: Negative for retention or other urinary change Musculoskeletal: Negative for other MSK pain or swelling Skin: Negative for color change or other new lesions Neurological: Negative for worsening tremors and other numbness  Psychiatric/Behavioral: Negative for worsening agitation or other fatigue All other system neg per pt    Objective:   Physical Exam BP 124/78   Pulse 90   Ht 5\' 1"  (1.549 m)   Wt 121 lb (54.9 kg)   SpO2 98%   BMI 22.86 kg/m  VS noted, non toxic Constitutional: Pt appears in NAD HENT: Head: NCAT.  Right Ear: External ear normal.  Left Ear: External ear normal.  Eyes: . Pupils are equal, round, and reactive to light. Conjunctivae and EOM are normal Nose: without d/c or deformity Neck: Neck supple. Gross normal ROM Cardiovascular: Normal rate and regular rhythm.   Pulmonary/Chest: Effort normal and breath sounds decreased without rales but with trace intermittent left > right wheezing.  Abd:  Soft, NT, ND, + BS, Neurological: Pt is alert. At  baseline orientation, motor grossly intact Skin: Skin is warm. No rashes, other new lesions, 2+ LE edema left > right to knees Psychiatric: Pt behavior is normal without agitation  No other exam findings  03/25/2016  2D echo summary Study Conclusions - Procedure narrative: Transthoracic echocardiography for left   ventricular function evaluation and for assessment of valvular   function. Image quality was adequate. The study was technically   difficult. - Left ventricle: The cavity size was moderately dilated. Systolic   function was severely reduced. The estimated ejection fraction   was in the range of 15-20%. Diffuse hypokinesis. Doppler   parameters are consistent with abnormal left ventricular   relaxation (grade 1 diastolic dysfunction). Doppler parameters   are consistent with  elevated ventricular end-diastolic filling   pressure. - Aortic valve: There was trivial regurgitation. - Mitral valve: There was moderate regurgitation. - Left atrium: The atrium was mildly dilated. - Right ventricle: The cavity size was moderately dilated. Wall   thickness was normal. Systolic function was moderately reduced. - Tricuspid valve: There was moderate regurgitation. - Pulmonary arteries: Systolic pressure was within the normal   range. - Inferior vena cava: The vessel was normal in size. Impressions: - Compared to the prior study on 01/04/2014 LVEF has decreased from   40-45% to 15-20%, RVEF is moderately reduced.    Assessment & Plan:

## 2016-08-26 NOTE — Assessment & Plan Note (Signed)
With mild worsening, for lasix 20 bid with baseline labs, and f/u in 1 wk with f/u labs; also has f/u with cardiology soon as well

## 2016-08-26 NOTE — Assessment & Plan Note (Signed)
Essentially resolved though still has significnat tenderness to the left calf more then right, also cant r/o DVT - for bilat LE venous dopplers today

## 2016-08-26 NOTE — Patient Instructions (Signed)
Please take all new medication as prescribed - the lasix at 20 mg twice per day  We will list the mupirocin as an allergy, though I am not totally convinced this is true  Please continue all other medications as before, and refills have been done if requested.  Please have the pharmacy call with any other refills you may need.  Please keep your appointments with your specialists as you may have planned  You will be contacted regarding the referral for: Leg venous doppler to make sure no blood clot  Please go to the LAB in the Basement (turn left off the elevator) for the tests to be done today  You will be contacted by phone if any changes need to be made immediately.  Otherwise, you will receive a letter about your results with an explanation, but please check with MyChart first.  Please return in 1 week, or sooner if needed

## 2016-08-26 NOTE — Assessment & Plan Note (Signed)
stable overall by history and exam, recent data reviewed with pt, and pt to continue medical treatment as before,  to f/u any worsening symptoms or concerns  BP Readings from Last 3 Encounters:  08/26/16 124/78  08/18/16 120/82  07/24/16 118/82

## 2016-08-26 NOTE — Assessment & Plan Note (Signed)
See above left leg cellulitis

## 2016-08-27 ENCOUNTER — Ambulatory Visit (HOSPITAL_COMMUNITY)
Admission: RE | Admit: 2016-08-27 | Discharge: 2016-08-27 | Disposition: A | Discharge status: Home / Self Care | Payer: Medicare Other | Source: Ambulatory Visit | Attending: Cardiology | Admitting: Cardiology

## 2016-08-27 DIAGNOSIS — M7989 Other specified soft tissue disorders: Secondary | ICD-10-CM | POA: Insufficient documentation

## 2016-09-02 ENCOUNTER — Ambulatory Visit (INDEPENDENT_AMBULATORY_CARE_PROVIDER_SITE_OTHER): Payer: Medicare Other | Admitting: Internal Medicine

## 2016-09-02 ENCOUNTER — Encounter: Payer: Self-pay | Admitting: Internal Medicine

## 2016-09-02 ENCOUNTER — Other Ambulatory Visit (INDEPENDENT_AMBULATORY_CARE_PROVIDER_SITE_OTHER): Payer: Medicare Other

## 2016-09-02 VITALS — BP 116/78 | HR 97 | Ht 61.0 in | Wt 116.0 lb

## 2016-09-02 DIAGNOSIS — I5023 Acute on chronic systolic (congestive) heart failure: Secondary | ICD-10-CM | POA: Diagnosis not present

## 2016-09-02 DIAGNOSIS — J449 Chronic obstructive pulmonary disease, unspecified: Secondary | ICD-10-CM

## 2016-09-02 DIAGNOSIS — F17200 Nicotine dependence, unspecified, uncomplicated: Secondary | ICD-10-CM | POA: Diagnosis not present

## 2016-09-02 DIAGNOSIS — I10 Essential (primary) hypertension: Secondary | ICD-10-CM

## 2016-09-02 LAB — BASIC METABOLIC PANEL
BUN: 14 mg/dL (ref 6–23)
CALCIUM: 9.3 mg/dL (ref 8.4–10.5)
CHLORIDE: 100 meq/L (ref 96–112)
CO2: 32 meq/L (ref 19–32)
CREATININE: 0.81 mg/dL (ref 0.40–1.20)
GFR: 74.4 mL/min (ref >60.00)
Glucose, Bld: 84 mg/dL (ref 70–99)
Potassium: 4.1 mEq/L (ref 3.5–5.1)
Sodium: 139 mEq/L (ref 135–145)

## 2016-09-02 MED ORDER — FUROSEMIDE 20 MG PO TABS: ORAL_TABLET | ORAL | 11 refills | Status: DC

## 2016-09-02 NOTE — Assessment & Plan Note (Signed)
stable overall by history and exam, recent data reviewed with pt, and pt to continue medical treatment as before,  to f/u any worsening symptoms or concerns  

## 2016-09-02 NOTE — Assessment & Plan Note (Signed)
stable overall by history and exam, and pt to continue medical treatment as before,  to f/u any worsening symptoms or concerns;le

## 2016-09-02 NOTE — Assessment & Plan Note (Signed)
Ok for OTC nicotine patch as was was not able to tolerate the chantix

## 2016-09-02 NOTE — Patient Instructions (Signed)
.  OK to change the lasix to 20 mg every Morning , but also a second dose in the PM for persistent leg swelling  Please continue all other medications as before, and refills have been done if requested.  Please have the pharmacy call with any other refills you may need.  Please continue your efforts at being more active, low cholesterol diet, and weight control.  Please keep your appointments with your specialists as you may have planned - Dr Harrington Challenger  Please go to the LAB in the Basement (turn left off the elevator) for the tests to be done today  You will be contacted by phone if any changes need to be made immediately.  Otherwise, you will receive a letter about your results with an explanation, but please check with MyChart first.  Please remember to sign up for MyChart if you have not done so, as this will be important to you in the future with finding out test results, communicating by private email, and scheduling acute appointments online when needed.  Please return in 6 months, or sooner if needed

## 2016-09-02 NOTE — Assessment & Plan Note (Signed)
Improved overall, ok to change the lasix to 20 qd with Second dose in PM for persistent swelling, BP is lower today so will hold on BB or ARB addition, to cont asa 81 and f/u cardiology as planned

## 2016-09-02 NOTE — Progress Notes (Signed)
Subjective:    Patient ID: Kimberly [name], female    DOB: [birth year], 71 y.o.   MRN: [id number]  HPI  Here to f/u; states very good compliance with meds with lasix 20 bid, has had good diuresis without orthostatic symptoms or cramps, and lost 5 lbs over 1 wk.  Pt denies chest pain, increased sob or doe, wheezing, orthopnea, PND, increased LE swelling, palpitations, dizziness or syncope. Has f/u appt with Dr Harrington Challenger sept 7.  Asks to quit smoking and willing to try the chantix but has n/v with it previously Wt Readings from Last 3 Encounters:  09/02/16 116 lb (52.6 kg)  08/26/16 121 lb (54.9 kg)  08/18/16 118 lb (53.5 kg)   BP Readings from Last 3 Encounters:  09/02/16 116/78  08/26/16 124/78  08/18/16 120/82   Past Medical History:  Diagnosis Date  . Anxiety   . Atrial tachycardia ([place])   . Bursitis of shoulder, right, adhesive   . CHF (congestive heart failure) ([place])   . Chronic bronchitis ([place])    "1-2 times/yr" (01/23/2014)  . COPD (chronic obstructive pulmonary disease) ([place])   . CVD (cerebrovascular disease)   . Dyslipidemia   . Dysrhythmia   . Frequency of urination   . GERD (gastroesophageal reflux disease)   . Heart murmur   . History of stomach ulcers   . HTN (hypertension) 02/22/2011  . Migraines    "stopped many years ago" (06/14/2014)  . Osteoporosis 08/19/2016  . Pericarditis   . Pneumonia "10 times" (06/14/2014)  . 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 Silent myocardial infarction ([place]) "late 1990's"  . Stress incontinence    "was suppose to have been tacked up years ago but I didn't do it"  . Syncope, near    Associated with atrial tachycardia-event recorder 1/16  . Thoracic outlet syndrome   . VSD (ventricular septal defect)    Past Surgical History:  Procedure Laterality Date  . CARDIAC CATHETERIZATION  "quite a few"  . [place]  . CHOLECYSTECTOMY OPEN  1970's  . CORONARY ANGIOGRAM  2000   No significant  CAD  . ELECTROPHYSIOLOGIC STUDY N/A 06/14/2014   Procedure: A-Flutter/A-Tach/SVT Ablation;  Surgeon: Evans Lance, MD;  Location: [facility];  Service: Cardiovascular;  Laterality: N/A;  . MYRINGOTOMY WITH TUBE PLACEMENT Right 2015  . SVT ABLATION  06/14/2014  . TUBAL LIGATION  1972  . VSD REPAIR  1958; 1967    reports that she has been smoking Cigarettes.  She has a 11.55 pack-year smoking history. She has never used smokeless tobacco. She reports that she drinks alcohol. She reports that she does not use drugs. family history includes Alcohol abuse in her other; Arthritis in her other and other; Cancer in her father, mother, and other; Hypertension in her other and unknown relative; Stroke in her other and unknown relative; Throat cancer in her unknown relative. Allergies  Allergen Reactions  . Mupirocin Other (See Comments)    Burning, pain, swelling and sob  . Codeine     Nausea    Current Outpatient Prescriptions on File Prior to Visit  Medication Sig Dispense Refill  . albuterol (VENTOLIN HFA) 108 (90 Base) MCG/ACT inhaler Inhale 2 puffs into the lungs every 6 (six) hours as needed for wheezing or shortness of breath. 18 g 11  . alendronate (FOSAMAX) 70 MG tablet Take 1 tablet (70 mg total) by mouth every 7 (seven) days. Take with a full  glass of water on an empty stomach. 12 tablet 3  . ALPRAZolam (XANAX) 0.25 MG tablet Take 1 tablet (0.25 mg total) by mouth 2 (two) times daily as needed for anxiety. 60 tablet 2  . aspirin 81 MG tablet Take 1 tablet (81 mg total) by mouth daily. 100 tablet 99  . budesonide-formoterol (SYMBICORT) 160-4.5 MCG/ACT inhaler Inhale 2 puffs into the lungs 2 (two) times daily as needed (wheezing and sob).    Marland Kitchen buPROPion (WELLBUTRIN XL) 150 MG 24 hr tablet Take 1 tablet (150 mg total) by mouth daily. 90 tablet 3  . celecoxib (CELEBREX) 100 MG capsule Take 1 capsule (100 mg total) by mouth 2 (two) times daily as needed. 60 capsule 5  . citalopram  (CELEXA) 20 MG tablet Take 1 tablet (20 mg total) by mouth daily. 90 tablet 3  . diclofenac (VOLTAREN) 0.1 % ophthalmic solution 4 (four) times daily.    . rosuvastatin (CRESTOR) 10 MG tablet TAKE 1 TABLET BY MOUTH EVERY DAY. 90 tablet 3   No current facility-administered medications on file prior to visit.    Review of Systems  Constitutional: Negative for other unusual diaphoresis or sweats HENT: Negative for ear discharge or swelling Eyes: Negative for other worsening visual disturbances Respiratory: Negative for stridor or other swelling  Gastrointestinal: Negative for worsening distension or other blood Genitourinary: Negative for retention or other urinary change Musculoskeletal: Negative for other MSK pain or swelling Skin: Negative for color change or other new lesions Neurological: Negative for worsening tremors and other numbness  Psychiatric/Behavioral: Negative for worsening agitation or other fatigue All other system neg per pt    Objective:   Physical Exam BP 116/78   Pulse 97   Ht 5\' 1"  (1.549 m)   Wt 116 lb (52.6 kg)   SpO2 98%   BMI 21.92 kg/m  VS noted,  Constitutional: Pt appears in NAD HENT: Head: NCAT.  Right Ear: External ear normal.  Left Ear: External ear normal.  Eyes: . Pupils are equal, round, and reactive to light. Conjunctivae and EOM are normal Nose: without d/c or deformity Neck: Neck supple. Gross normal ROM Cardiovascular: Normal rate and regular rhythm.   Pulmonary/Chest: Effort normal and breath sounds without rales or wheezing.  Abd:  Soft, NT, ND, + BS, no organomegaly Neurological: Pt is alert. At baseline orientation, motor grossly intact Skin: Skin is warm. No rashes, other new lesions, trace to 1+ bilat LE edema left > right with numerous varicosities Psychiatric: Pt behavior is normal without agitation  No other exam findings Lab Results  Component Value Date   WBC 6.3 08/26/2016   HGB 13.6 08/26/2016   HCT 41.6 08/26/2016    PLT 217.0 08/26/2016   GLUCOSE 100 (H) 08/26/2016   CHOL 150 07/25/2016   TRIG 55.0 07/25/2016   HDL 78.50 07/25/2016   LDLDIRECT 112.6 03/16/2013   LDLCALC 61 07/25/2016   ALT 10 08/26/2016   AST 15 08/26/2016   NA 139 08/26/2016   K 4.4 08/26/2016   CL 103 08/26/2016   CREATININE 0.90 08/26/2016   BUN 15 08/26/2016   CO2 31 08/26/2016   TSH 0.99 07/25/2016   INR 1.1 (H) 07/24/2016   Brain natriuretic peptide  Order: [id number]  Status:  Final result Visible to patient:  Yes (MyChart) Next appt:  09/08/2016 at 10:30 AM in Internal Medicine Lyndal Pulley, DO) Dx:  Leg swelling; Left leg cellulitis; Ac...  Notes recorded by Biagio Borg, MD on 08/26/2016 at 12:24  PM EDT Left message on MyChart, pt to cont same tx    Ref Range & Units 7d ago 28yr ago 64yr ago   Pro B Natriuretic peptide (BNP) 0.0 - 100.0 pg/mL 534.0   54.0  80.8            Assessment & Plan:

## 2016-09-03 ENCOUNTER — Encounter: Payer: Self-pay | Admitting: Internal Medicine

## 2016-09-08 ENCOUNTER — Ambulatory Visit: Payer: Medicare Other | Admitting: Family Medicine

## 2016-09-11 ENCOUNTER — Ambulatory Visit: Payer: Medicare Other | Admitting: Physician Assistant

## 2016-09-18 ENCOUNTER — Telehealth: Payer: Self-pay | Admitting: Internal Medicine

## 2016-09-18 MED ORDER — DOXYCYCLINE HYCLATE 100 MG PO TABS: 100.0000 mg | ORAL_TABLET | Freq: Two times a day (BID) | ORAL | 0 refills | Status: DC

## 2016-09-18 NOTE — Telephone Encounter (Signed)
Please advise 

## 2016-09-18 NOTE — Telephone Encounter (Signed)
Doxycycline - done erx 

## 2016-09-18 NOTE — Telephone Encounter (Signed)
Informed pt and expressed understanding.

## 2016-09-18 NOTE — Telephone Encounter (Signed)
Patient states when she was here on 8/14 she cut her leg on the table. It now seems to be infected. She states its just like last time when her dog cut her leg. She really does not want to come in for an appointment if she does not have too. She would just like an abx called in to the pharmacy. Please advise. Thank you.

## 2016-09-19 ENCOUNTER — Ambulatory Visit: Payer: Medicare Other | Admitting: Internal Medicine

## 2016-09-19 NOTE — Progress Notes (Signed)
Cardiology Office Note   Date:  09/26/2016   ID:  Bellina, Tokarczyk Jan 18, 1946, MRN [id number]  PCP:  Biagio Borg, MD  Cardiologist:   Dorris Carnes, MD   F/U of CHF      History of Present Illness: Kimberly [name] is a 71 y.o. female with a history of congenital heart disease (s/p VSD repair), PVOD, dyslipidemia, significant  COPD and continued  tobacco use. Echo in Dec 2015 LVEF was 40 to 45% No VSD  She is also st/p SVT ablation in May 2016. During procedure had transient afib  I saw her last winter  Echo done showed LVEF 15 to 20%  Down from 40 to45%  RVEF mod reduced  I reviewed with pt after   She was busy with husband who is ill   Did not want anything done at time    Since I saw her she has been seen several times by Marshall Cork  Ankles were very swollen earlier this summer  Lasix added. With some improvement She has had 2 cuts to leg with rsultant infection.  Recent one occurred at [facility] office  On BActrim now.  She still has swelling   Still has SOB   She says she spoke to Dr Danton Clap not aware until than the signif of LV dysfunction    Denies CP      Current Meds  Medication Sig  . albuterol (VENTOLIN HFA) 108 (90 Base) MCG/ACT inhaler Inhale 2 puffs into the lungs every 6 (six) hours as needed for wheezing or shortness of breath.  Marland Kitchen alendronate (FOSAMAX) 70 MG tablet Take 1 tablet (70 mg total) by mouth every 7 (seven) days. Take with a full glass of water on an empty stomach.  . ALPRAZolam (XANAX) 0.25 MG tablet Take 1 tablet (0.25 mg total) by mouth 2 (two) times daily as needed for anxiety.  Marland Kitchen aspirin 81 MG tablet Take 1 tablet (81 mg total) by mouth daily.  . budesonide-formoterol (SYMBICORT) 160-4.5 MCG/ACT inhaler Inhale 2 puffs into the lungs 2 (two) times daily as needed (wheezing and sob).  Marland Kitchen buPROPion (WELLBUTRIN XL) 150 MG 24 hr tablet Take 1 tablet (150 mg total) by mouth daily.  . celecoxib (CELEBREX) 100 MG capsule Take 1 capsule (100 mg total) by  mouth 2 (two) times daily as needed.  . citalopram (CELEXA) 20 MG tablet Take 1 tablet (20 mg total) by mouth daily.  . diclofenac (VOLTAREN) 0.1 % ophthalmic solution 4 (four) times daily.  . furosemide (LASIX) 40 MG tablet Take 1 tablet (40 mg total) by mouth 2 (two) times daily.  . rosuvastatin (CRESTOR) 10 MG tablet TAKE 1 TABLET BY MOUTH EVERY DAY.  . sulfamethoxazole-trimethoprim (BACTRIM DS,SEPTRA DS) 800-160 MG tablet Take 1 tablet by mouth 2 (two) times daily.     Allergies:   Mupirocin and Codeine   Past Medical History:  Diagnosis Date  . Anxiety   . Atrial tachycardia ([place])   . Bursitis of shoulder, right, adhesive   . CHF (congestive heart failure) ([place])   . Chronic bronchitis ([place])    "1-2 times/yr" (01/23/2014)  . COPD (chronic obstructive pulmonary disease) ([place])   . CVD (cerebrovascular disease)   . Dyslipidemia   . Dysrhythmia   . Frequency of urination   . GERD (gastroesophageal reflux disease)   . Heart murmur   . History of stomach ulcers   . HTN (hypertension) 02/22/2011  . Migraines    "  stopped many years ago" (06/14/2014)  . Osteoporosis 08/19/2016  . Pericarditis   . Pneumonia "10 times" (06/14/2014)  . 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 Silent myocardial infarction ([place]) "late 1990's"  . Stress incontinence    "was suppose to have been tacked up years ago but I didn't do it"  . Syncope, near    Associated with atrial tachycardia-event recorder 1/16  . Thoracic outlet syndrome   . VSD (ventricular septal defect)     Past Surgical History:  Procedure Laterality Date  . CARDIAC CATHETERIZATION  "quite a few"  . [place]  . CHOLECYSTECTOMY OPEN  1970's  . CORONARY ANGIOGRAM  2000   No significant CAD  . ELECTROPHYSIOLOGIC STUDY N/A 06/14/2014   Procedure: A-Flutter/A-Tach/SVT Ablation;  Surgeon: Evans Lance, MD;  Location: [facility];  Service: Cardiovascular;  Laterality: N/A;  .  MYRINGOTOMY WITH TUBE PLACEMENT Right 2015  . SVT ABLATION  06/14/2014  . TUBAL LIGATION  1972  . VSD REPAIR  1958; 79     Social History:  The patient  reports that she has been smoking Cigarettes.  She has a 11.55 pack-year smoking history. She has never used smokeless tobacco. She reports that she drinks alcohol. She reports that she does not use drugs.   Family History:  The patient's family history includes Alcohol abuse in her other; Arthritis in her other and other; Cancer in her father, mother, and other; Hypertension in her other and unknown relative; Stroke in her other and unknown relative; Throat cancer in her unknown relative.    ROS:  Please see the history of present illness. All other systems are reviewed and  Negative to the above problem except as noted.    PHYSICAL EXAM: VS:  BP 110/70   Pulse 63   Ht 5\' 1"  (1.549 m)   Wt 118 lb 9.6 oz (53.8 kg)   BMI 22.41 kg/m   GEN: Well nourished, well developed, in no acute distress  HEENT: normal  Neck:Mild increase in JVP , carotid bruits, or masses Cardiac: RRR;Gr III/VI systolic murmur LSB   No, rubs, or gallops,2+ edema  Respiratory:  clear to auscultation bilaterally, normal work of breathing GI: soft, nontender, nondistended, + BS  No hepatomegaly  MS: no deformity Moving all extremities   Skin: warm and dry, no rash  2 sorese on back of R calf  Skin erythematous Neuro:  Strength and sensation are intact Psych: euthymic mood, full affect   EKG:  EKG is ordered today.  SR 63  RBBB  T wave inversion inferolaterally     Lipid Panel    Component Value Date/Time   CHOL 150 07/25/2016 1129   TRIG 55.0 07/25/2016 1129   HDL 78.50 07/25/2016 1129   CHOLHDL 2 07/25/2016 1129   VLDL 11.0 07/25/2016 1129   LDLCALC 61 07/25/2016 1129   LDLDIRECT 112.6 03/16/2013 1057      Wt Readings from Last 3 Encounters:  09/26/16 118 lb 9.6 oz (53.8 kg)  09/24/16 119 lb (54 kg)  09/02/16 116 lb (52.6 kg)      ASSESSMENT  AND PLAN:  1  Acute on chronic systolic CHF   Volume is up on exam   I discussed test resuts with patient  I did talk to her last spring to inform her of the change in LV function but she was anxious about her husbands health, did not want anything done at time. She  is anxious now   Also uncomfortable wth increased fluid  I would recomm getting labs today  Would also recomm changing lasix t0 60 mg 1x per day  The pt needs a R and L heart cath done when 1 volume improved some and 2 wound improved    2  LE ulcers.  AGree with antibiotics  Pt has been seen in surgery  Will check CBC   Hopefully edema will improve with change in lasix  This should assist wound healing    3  Hx of SVT  No clincal recurrence  4  Hx VSD    5  COPD  Counselled on smotking cessation.    6  HTN  BP OK  Current medicines are reviewed at length with the patient today.  The patient does not have concerns regarding medicines.  Signed, Dorris Carnes, MD  09/26/2016 11:15 AM    [address], [place], [place]  [address] Phone: [phone]; Fax: [phone]

## 2016-09-24 ENCOUNTER — Ambulatory Visit (INDEPENDENT_AMBULATORY_CARE_PROVIDER_SITE_OTHER): Payer: Medicare Other | Admitting: Internal Medicine

## 2016-09-24 ENCOUNTER — Encounter: Payer: Self-pay | Admitting: Internal Medicine

## 2016-09-24 VITALS — BP 116/72 | HR 82 | Temp 98.1°F | Ht 61.0 in | Wt 119.0 lb

## 2016-09-24 DIAGNOSIS — J449 Chronic obstructive pulmonary disease, unspecified: Secondary | ICD-10-CM

## 2016-09-24 DIAGNOSIS — I5023 Acute on chronic systolic (congestive) heart failure: Secondary | ICD-10-CM

## 2016-09-24 DIAGNOSIS — L02415 Cutaneous abscess of right lower limb: Secondary | ICD-10-CM | POA: Diagnosis not present

## 2016-09-24 MED ORDER — FUROSEMIDE 40 MG PO TABS: 40.0000 mg | ORAL_TABLET | Freq: Two times a day (BID) | ORAL | 11 refills | Status: DC

## 2016-09-24 MED ORDER — SULFAMETHOXAZOLE-TRIMETHOPRIM 800-160 MG PO TABS: 1.0000 | ORAL_TABLET | Freq: Two times a day (BID) | ORAL | 0 refills | Status: DC

## 2016-09-24 NOTE — Progress Notes (Signed)
Subjective:    Patient ID: Kimberly [name], female    DOB: 12-Feb-1945, 71 y.o.   MRN: [id number]  HPI  Here with c/o persistent right mid posterior calf red, tender, swelling maybe even a bit worse in the last few days despite good compliance with doxycycline.  Originally began as abrasion to post leg actually here at my office with simply getting off the exam table.  This was not witnessed as I had left the room but pt stated at the time that she did not think it significant.  She later called and I gave rx for doxycycline but now persistent maybe worsening. No high fever, chills or overt drainage.  Also has good compliance with taking lasix 20 bid every day every day except for late yesterday.  Despite this the LLE has become somewhat more swollen and one mid pretibial area about 5 mm with weepy clear fluid running down the leg.  Pt denies chest pain, increased sob or doe, wheezing, orthopnea, PND, palpitations, dizziness or syncope.   Pt denies polydipsia, polyuria  Wt is up several lbs again Wt Readings from Last 3 Encounters:  09/24/16 119 lb (54 kg)  09/02/16 116 lb (52.6 kg)  08/26/16 121 lb (54.9 kg)   Past Medical History:  Diagnosis Date  . Anxiety   . Atrial tachycardia ([place])   . Bursitis of shoulder, right, adhesive   . CHF (congestive heart failure) ([place])   . Chronic bronchitis ([place])    "1-2 times/yr" (01/23/2014)  . COPD (chronic obstructive pulmonary disease) ([place])   . CVD (cerebrovascular disease)   . Dyslipidemia   . Dysrhythmia   . Frequency of urination   . GERD (gastroesophageal reflux disease)   . Heart murmur   . History of stomach ulcers   . HTN (hypertension) 02/22/2011  . Migraines    "stopped many years ago" (06/14/2014)  . Osteoporosis 08/19/2016  . Pericarditis   . Pneumonia "10 times" (06/14/2014)  . 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 Silent myocardial infarction ([place]) "late 1990's"  . Stress incontinence    "was  suppose to have been tacked up years ago but I didn't do it"  . Syncope, near    Associated with atrial tachycardia-event recorder 1/16  . Thoracic outlet syndrome   . VSD (ventricular septal defect)    Past Surgical History:  Procedure Laterality Date  . CARDIAC CATHETERIZATION  "quite a few"  . [place]  . CHOLECYSTECTOMY OPEN  1970's  . CORONARY ANGIOGRAM  2000   No significant CAD  . ELECTROPHYSIOLOGIC STUDY N/A 06/14/2014   Procedure: A-Flutter/A-Tach/SVT Ablation;  Surgeon: Evans Lance, MD;  Location: [facility];  Service: Cardiovascular;  Laterality: N/A;  . MYRINGOTOMY WITH TUBE PLACEMENT Right 2015  . SVT ABLATION  06/14/2014  . TUBAL LIGATION  1972  . VSD REPAIR  1958; 1967    reports that she has been smoking Cigarettes.  She has a 11.55 pack-year smoking history. She has never used smokeless tobacco. She reports that she drinks alcohol. She reports that she does not use drugs. family history includes Alcohol abuse in her other; Arthritis in her other and other; Cancer in her father, mother, and other; Hypertension in her other and unknown relative; Stroke in her other and unknown relative; Throat cancer in her unknown relative. Allergies  Allergen Reactions  . Mupirocin Other (See Comments)    Burning, pain, swelling and sob  . Codeine  Nausea    Current Outpatient Prescriptions on File Prior to Visit  Medication Sig Dispense Refill  . albuterol (VENTOLIN HFA) 108 (90 Base) MCG/ACT inhaler Inhale 2 puffs into the lungs every 6 (six) hours as needed for wheezing or shortness of breath. 18 g 11  . alendronate (FOSAMAX) 70 MG tablet Take 1 tablet (70 mg total) by mouth every 7 (seven) days. Take with a full glass of water on an empty stomach. 12 tablet 3  . ALPRAZolam (XANAX) 0.25 MG tablet Take 1 tablet (0.25 mg total) by mouth 2 (two) times daily as needed for anxiety. 60 tablet 2  . aspirin 81 MG tablet Take 1 tablet (81 mg total) by mouth  daily. 100 tablet 99  . budesonide-formoterol (SYMBICORT) 160-4.5 MCG/ACT inhaler Inhale 2 puffs into the lungs 2 (two) times daily as needed (wheezing and sob).    Marland Kitchen buPROPion (WELLBUTRIN XL) 150 MG 24 hr tablet Take 1 tablet (150 mg total) by mouth daily. 90 tablet 3  . celecoxib (CELEBREX) 100 MG capsule Take 1 capsule (100 mg total) by mouth 2 (two) times daily as needed. 60 capsule 5  . citalopram (CELEXA) 20 MG tablet Take 1 tablet (20 mg total) by mouth daily. 90 tablet 3  . diclofenac (VOLTAREN) 0.1 % ophthalmic solution 4 (four) times daily.    Marland Kitchen doxycycline (VIBRA-TABS) 100 MG tablet Take 1 tablet (100 mg total) by mouth 2 (two) times daily. 14 tablet 0  . rosuvastatin (CRESTOR) 10 MG tablet TAKE 1 TABLET BY MOUTH EVERY DAY. 90 tablet 3   No current facility-administered medications on file prior to visit.    Review of Systems  Constitutional: Negative for other unusual diaphoresis or sweats HENT: Negative for ear discharge or swelling Eyes: Negative for other worsening visual disturbances Respiratory: Negative for stridor or other swelling  Gastrointestinal: Negative for worsening distension or other blood Genitourinary: Negative for retention or other urinary change Musculoskeletal: Negative for other MSK pain or swelling Skin: Negative for color change or other new lesions Neurological: Negative for worsening tremors and other numbness  Psychiatric/Behavioral: Negative for worsening agitation or other fatigue All other system neg per pt    Objective:   Physical Exam BP 116/72   Pulse 82   Temp 98.1 F (36.7 C) (Oral)   Ht 5\' 1"  (1.549 m)   Wt 119 lb (54 kg)   SpO2 99%   BMI 22.48 kg/m  VS noted,  Constitutional: Pt appears in NAD HENT: Head: NCAT.  Right Ear: External ear normal.  Left Ear: External ear normal.  Eyes: . Pupils are equal, round, and reactive to light. Conjunctivae and EOM are normal Nose: without d/c or deformity Neck: Neck supple. Gross normal  ROM Cardiovascular: Normal rate and regular rhythm.   Pulmonary/Chest: Effort normal and breath sounds without rales or wheezing.  Abd:  Soft, NT, ND, + BS, no organomegaly Neurological: Pt is alert. At baseline orientation, motor grossly intact Skin: Skin is warm. 1-2+ LLE edema with one area of weepiness without s/s of cellulitis, RLE with worse 2-3+ diffuse edema worst at the mid post calf with redness/tender/swelling and possible fluctuance at the site of the healing abrasion. No overt drainage Psychiatric: Pt behavior is normal without agitation  No other exam findings  Transthoracic Echocardiography - summary 03/25/2016 Study Conclusions  - Procedure narrative: Transthoracic echocardiography for left   ventricular function evaluation and for assessment of valvular   function. Image quality was adequate. The study was technically  difficult. - Left ventricle: The cavity size was moderately dilated. Systolic   function was severely reduced. The estimated ejection fraction   was in the range of 15-20%. Diffuse hypokinesis. Doppler   parameters are consistent with abnormal left ventricular   relaxation (grade 1 diastolic dysfunction). Doppler parameters   are consistent with elevated ventricular end-diastolic filling   pressure. - Aortic valve: There was trivial regurgitation. - Mitral valve: There was moderate regurgitation. - Left atrium: The atrium was mildly dilated. - Right ventricle: The cavity size was moderately dilated. Wall   thickness was normal. Systolic function was moderately reduced. - Tricuspid valve: There was moderate regurgitation. - Pulmonary arteries: Systolic pressure was within the normal   range. - Inferior vena cava: The vessel was normal in size. Impressions: - Compared to the prior study on 01/04/2014 LVEF has decreased from   40-45% to 15-20%, RVEF is moderately reduced.    Assessment & Plan:

## 2016-09-24 NOTE — Assessment & Plan Note (Signed)
Abscess vs cellulitis, will change doxy to septra ds bid, and refer to [facility] for asap visit to r/o abscess that may need intervention

## 2016-09-24 NOTE — Assessment & Plan Note (Signed)
Possibly exacerbated by current illness, not improving with lasix 20 bid, will increase to lasix 40 bid (but to take only 40qam if swelling resolves), pt has f/u with cardiology sept 7, will cont to monitor daily wts

## 2016-09-24 NOTE — Patient Instructions (Addendum)
OK to stop the doxycycline  Please take all new medication as prescribed - the Septra antibiotic  OK to increase the lasix to 40 mg twice per day (with the second dose each day only if you have persistent swelling)  Please continue all other medications as before, and refills have been done if requested.  Please have the pharmacy call with any other refills you may need.  Please continue your efforts at being more active, low cholesterol diet, and weight control.  Please keep your appointments with your specialists as you may have planned - Dr Harrington Challenger for Friday sept 7  You will be contacted regarding the referral for: [facility] for first thing in the AM at your request (to see [facility] now)

## 2016-09-24 NOTE — Assessment & Plan Note (Signed)
Stable, to cont current tx

## 2016-09-25 DIAGNOSIS — L02415 Cutaneous abscess of right lower limb: Secondary | ICD-10-CM | POA: Diagnosis not present

## 2016-09-26 ENCOUNTER — Ambulatory Visit (INDEPENDENT_AMBULATORY_CARE_PROVIDER_SITE_OTHER): Payer: Medicare Other | Admitting: Internal Medicine

## 2016-09-26 VITALS — BP 110/70 | HR 63 | Ht 61.0 in | Wt 118.6 lb

## 2016-09-26 DIAGNOSIS — R600 Localized edema: Secondary | ICD-10-CM | POA: Diagnosis not present

## 2016-09-26 DIAGNOSIS — I471 Supraventricular tachycardia, unspecified: Secondary | ICD-10-CM

## 2016-09-26 DIAGNOSIS — I5022 Chronic systolic (congestive) heart failure: Secondary | ICD-10-CM | POA: Diagnosis not present

## 2016-09-26 DIAGNOSIS — Q210 Ventricular septal defect: Secondary | ICD-10-CM

## 2016-09-26 DIAGNOSIS — I10 Essential (primary) hypertension: Secondary | ICD-10-CM

## 2016-09-26 LAB — BASIC METABOLIC PANEL
BUN/Creatinine Ratio: 12 (ref 12–28)
BUN: 14 mg/dL (ref 8–27)
CALCIUM: 10 mg/dL (ref 8.7–10.3)
CHLORIDE: 99 mmol/L (ref 96–106)
CO2: 29 mmol/L (ref 20–29)
Creatinine, Ser: 1.2 mg/dL — ABNORMAL HIGH (ref 0.57–1.00)
GFR calc Af Amer: 53 mL/min/{1.73_m2} — ABNORMAL LOW (ref >59)
GFR calc non Af Amer: 46 mL/min/{1.73_m2} — ABNORMAL LOW (ref >59)
Glucose: 77 mg/dL (ref 65–99)
POTASSIUM: 4.4 mmol/L (ref 3.5–5.2)
Sodium: 139 mmol/L (ref 134–144)

## 2016-09-26 LAB — CBC
Hematocrit: 39.1 % (ref 34.0–46.6)
Hemoglobin: 13.8 g/dL (ref 11.1–15.9)
MCH: 29.3 pg (ref 26.6–33.0)
MCHC: 35.3 g/dL (ref 31.5–35.7)
MCV: 83 fL (ref 79–97)
PLATELETS: 243 10*3/uL (ref 150–379)
RBC: 4.71 x10E6/uL (ref 3.77–5.28)
RDW: 14 % (ref 12.3–15.4)
WBC: 5.8 10*3/uL (ref 3.4–10.8)

## 2016-09-26 LAB — SEDIMENTATION RATE: SED RATE: 5 mm/h (ref 0–40)

## 2016-09-26 LAB — PRO B NATRIURETIC PEPTIDE: NT-Pro BNP: 3600 pg/mL — ABNORMAL HIGH (ref 0–301)

## 2016-09-26 NOTE — Patient Instructions (Signed)
Today take the 2nd dose of lasix (40 mg) Saturday plan on taking 60 mg in the morning  Your physician recommends that you return for lab work (bmet, cbc, sed rate, bnp).  We will call you after Dr. Harrington Challenger reviews.

## 2016-09-28 ENCOUNTER — Telehealth: Payer: Self-pay | Admitting: Internal Medicine

## 2016-09-28 DIAGNOSIS — I5023 Acute on chronic systolic (congestive) heart failure: Secondary | ICD-10-CM

## 2016-09-28 NOTE — Telephone Encounter (Signed)
Called pt about labs  And to see how feet/ankles are  She says that with 60 mg lasix she is urinating more  ANkles are improving   I would recomm continuing    Elevate  Wound on leg may heal better with edema down  She needs labs on Wed  BMET and BNP  She is not on KCL supplement   Will come in to get

## 2016-09-29 MED ORDER — FUROSEMIDE 40 MG PO TABS: 60.0000 mg | ORAL_TABLET | Freq: Every day | ORAL | 11 refills | Status: DC

## 2016-09-29 NOTE — Addendum Note (Signed)
Addended by: Rodman Key on: 09/29/2016 11:51 AM   Modules accepted: Orders

## 2016-09-29 NOTE — Telephone Encounter (Signed)
Reviewed with Dr. Harrington Challenger. Updated medication list to reflect lasix 60 mg daily Ordered BMET and BNP Appointment made for 9/12 for labs.

## 2016-10-01 ENCOUNTER — Telehealth: Payer: Self-pay | Admitting: Internal Medicine

## 2016-10-01 ENCOUNTER — Other Ambulatory Visit: Payer: Medicare Other | Admitting: *Deleted

## 2016-10-01 DIAGNOSIS — I5023 Acute on chronic systolic (congestive) heart failure: Secondary | ICD-10-CM

## 2016-10-01 LAB — BASIC METABOLIC PANEL
BUN/Creatinine Ratio: 12 (ref 12–28)
BUN: 15 mg/dL (ref 8–27)
CO2: 24 mmol/L (ref 20–29)
CREATININE: 1.22 mg/dL — AB (ref 0.57–1.00)
Calcium: 9 mg/dL (ref 8.7–10.3)
Chloride: 96 mmol/L (ref 96–106)
GFR calc Af Amer: 52 mL/min/{1.73_m2} — ABNORMAL LOW (ref >59)
GFR calc non Af Amer: 45 mL/min/{1.73_m2} — ABNORMAL LOW (ref >59)
GLUCOSE: 112 mg/dL — AB (ref 65–99)
Potassium: 4.6 mmol/L (ref 3.5–5.2)
SODIUM: 137 mmol/L (ref 134–144)

## 2016-10-01 LAB — PRO B NATRIURETIC PEPTIDE: NT-PRO BNP: 3221 pg/mL — AB (ref 0–301)

## 2016-10-01 NOTE — Telephone Encounter (Signed)
This medication was ordered by Dr. Jenny Reichmann. Will confirm with Dr. Harrington Challenger that he should re order if needed.

## 2016-10-01 NOTE — Telephone Encounter (Signed)
New message      *STAT* If patient is at the pharmacy, call can be transferred to refill team.   1. Which medications need to be refilled? (please list name of each medication and dose if known)   sulfamethoxazole-trimethoprim (BACTRIM DS,SEPTRA DS) 800-160 MG tablet Take 1 tablet by mouth 2 (two) times daily.     2. Which pharmacy/location (including street and city if local pharmacy) is medication to be sent to? [facility] - [phone]  3. Do they need a 30 day or 90 day supply?  Another week

## 2016-10-03 ENCOUNTER — Telehealth: Payer: Self-pay | Admitting: Internal Medicine

## 2016-10-03 NOTE — Telephone Encounter (Signed)
I spoke with pt who reports Dr. Harrington Challenger told her to call today with an update. Pt reports infected area on right leg looks better.  She is still having slight amount of yellowish drainage from this area. Swelling has improved some but does worsen when she has been up moving around. Swelling on right greater than left. Infected area is on right leg.  Is taking lasix 60 mg daily.  Had lab work on 9/12. Will forward to Dr. Harrington Challenger for review/recommendations.

## 2016-10-03 NOTE — Telephone Encounter (Signed)
Spoke to pt She will take 80 mg lasix tomorrow then 60 mg  I told her to come by Monday at around 1:45 so I can look at leg.

## 2016-10-03 NOTE — Telephone Encounter (Signed)
Appointment scheduled for 9/17 at 1:40

## 2016-10-03 NOTE — Telephone Encounter (Signed)
New message    Pt is calling stating Dr. Harrington Challenger wants her to call this morning and speak to nurse about blood work. Please call.

## 2016-10-06 ENCOUNTER — Other Ambulatory Visit: Payer: Self-pay | Admitting: *Deleted

## 2016-10-06 ENCOUNTER — Other Ambulatory Visit: Payer: Medicare Other | Admitting: *Deleted

## 2016-10-06 ENCOUNTER — Encounter: Payer: Self-pay | Admitting: *Deleted

## 2016-10-06 ENCOUNTER — Ambulatory Visit (INDEPENDENT_AMBULATORY_CARE_PROVIDER_SITE_OTHER): Payer: Self-pay | Admitting: Internal Medicine

## 2016-10-06 VITALS — BP 114/60 | Ht 61.0 in | Wt 115.1 lb

## 2016-10-06 DIAGNOSIS — T148XXA Other injury of unspecified body region, initial encounter: Secondary | ICD-10-CM

## 2016-10-06 DIAGNOSIS — Z01812 Encounter for preprocedural laboratory examination: Secondary | ICD-10-CM

## 2016-10-07 ENCOUNTER — Telehealth: Payer: Self-pay | Admitting: Cardiovascular Disease

## 2016-10-07 LAB — BASIC METABOLIC PANEL
BUN / CREAT RATIO: 11 — AB (ref 12–28)
BUN: 14 mg/dL (ref 8–27)
CO2: 27 mmol/L (ref 20–29)
CREATININE: 1.27 mg/dL — AB (ref 0.57–1.00)
Calcium: 9.1 mg/dL (ref 8.7–10.3)
Chloride: 101 mmol/L (ref 96–106)
GFR calc non Af Amer: 43 mL/min/{1.73_m2} — ABNORMAL LOW (ref >59)
GFR, EST AFRICAN AMERICAN: 50 mL/min/{1.73_m2} — AB (ref >59)
Glucose: 84 mg/dL (ref 65–99)
Potassium: 4.6 mmol/L (ref 3.5–5.2)
Sodium: 144 mmol/L (ref 134–144)

## 2016-10-07 LAB — CBC
HEMATOCRIT: 42.1 % (ref 34.0–46.6)
Hemoglobin: 13.9 g/dL (ref 11.1–15.9)
MCH: 28.8 pg (ref 26.6–33.0)
MCHC: 33 g/dL (ref 31.5–35.7)
MCV: 87 fL (ref 79–97)
Platelets: 273 10*3/uL (ref 150–379)
RBC: 4.82 x10E6/uL (ref 3.77–5.28)
RDW: 13.8 % (ref 12.3–15.4)
WBC: 6.6 10*3/uL (ref 3.4–10.8)

## 2016-10-07 LAB — PROTIME-INR
INR: 1.1 (ref 0.8–1.2)
PROTHROMBIN TIME: 11.1 s (ref 9.1–12.0)

## 2016-10-07 NOTE — Telephone Encounter (Signed)
See telephone message also from today regarding scheduling of cath.

## 2016-10-07 NOTE — Telephone Encounter (Signed)
No answer. Left message to call back.   

## 2016-10-07 NOTE — Telephone Encounter (Signed)
Follow up       Pt is scheduled to have a cath at [facility] on 10[facility]14-16.  She has decided to have it at [facility] in [place] because she lives in [place].  Please cancel [place] cath and resc it in [place].  Call pt with new date and time

## 2016-10-07 NOTE — Telephone Encounter (Signed)
Patient calling to schedule cath with Dr. Lorayne Bender number to call is home if no answer call cell [phone]

## 2016-10-07 NOTE — Telephone Encounter (Signed)
Returned call to patient. She would like Dr Fletcher Anon to do the right and left heart cath at [facility]. Patient agreeable to have done on 10/17/16 at 0730. Message left with scheduling to schedule.  Reviewed pre-procedural instructions with patient and she verbalized understanding as well as to come to [facility] on 10/17/16 at 0630 to check in.  Patient had labs drawn yesterday 10/06/16.

## 2016-10-08 ENCOUNTER — Encounter: Payer: Self-pay | Admitting: Internal Medicine

## 2016-10-08 ENCOUNTER — Ambulatory Visit (INDEPENDENT_AMBULATORY_CARE_PROVIDER_SITE_OTHER): Payer: Medicare Other | Admitting: Internal Medicine

## 2016-10-08 VITALS — BP 116/86 | HR 65 | Temp 97.9°F | Ht 61.0 in | Wt 115.0 lb

## 2016-10-08 DIAGNOSIS — J019 Acute sinusitis, unspecified: Secondary | ICD-10-CM

## 2016-10-08 DIAGNOSIS — I10 Essential (primary) hypertension: Secondary | ICD-10-CM | POA: Diagnosis not present

## 2016-10-08 DIAGNOSIS — J309 Allergic rhinitis, unspecified: Secondary | ICD-10-CM | POA: Diagnosis not present

## 2016-10-08 MED ORDER — HYDROCOD POLST-CPM POLST ER 10-8 MG/5ML PO SUER: 5.0000 mL | Freq: Two times a day (BID) | ORAL | 0 refills | Status: DC | PRN

## 2016-10-08 MED ORDER — AMOXICILLIN 500 MG PO CAPS: 1000.0000 mg | ORAL_CAPSULE | Freq: Two times a day (BID) | ORAL | 0 refills | Status: DC

## 2016-10-08 NOTE — Progress Notes (Signed)
Subjective:    Patient ID: Kimberly [name], female    DOB: 06-19-1945, 71 y.o.   MRN: [id number]  HPI   Here with 2-3 days acute onset fever, facial pain, pressure, headache, general weakness and malaise, and greenish d/c, with mild ST and cough, but pt denies chest pain, wheezing, increased sob or doe, orthopnea, PND, increased LE swelling, palpitations, dizziness or syncope.  Also Does have several wks ongoing nasal allergy symptoms with clearish congestion, itch and sneezing, without fever, pain, ST, cough, swelling or wheezing. No other interval hx Past Medical History:  Diagnosis Date  . Anxiety   . Atrial tachycardia ([place])   . Bursitis of shoulder, right, adhesive   . CHF (congestive heart failure) ([place])   . Chronic bronchitis ([facility])    "1-2 times/yr" (01/23/2014)  . COPD (chronic obstructive pulmonary disease) ([place])   . CVD (cerebrovascular disease)   . Dyslipidemia   . Dysrhythmia   . Frequency of urination   . GERD (gastroesophageal reflux disease)   . Heart murmur   . History of stomach ulcers   . HTN (hypertension) 02/22/2011  . Migraines    "stopped many years ago" (06/14/2014)  . Osteoporosis 08/19/2016  . Pericarditis   . Pneumonia "10 times" (06/14/2014)  . 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 Silent myocardial infarction ([place]) "late 1990's"  . Stress incontinence    "was suppose to have been tacked up years ago but I didn't do it"  . Syncope, near    Associated with atrial tachycardia-event recorder 1/16  . Thoracic outlet syndrome   . VSD (ventricular septal defect)    Past Surgical History:  Procedure Laterality Date  . CARDIAC CATHETERIZATION  "quite a few"  . [place]  . CHOLECYSTECTOMY OPEN  1970's  . CORONARY ANGIOGRAM  2000   No significant CAD  . ELECTROPHYSIOLOGIC STUDY N/A 06/14/2014   Procedure: A-Flutter/A-Tach/SVT Ablation;  Surgeon: Evans Lance, MD;  Location: [facility];  Service:  Cardiovascular;  Laterality: N/A;  . MYRINGOTOMY WITH TUBE PLACEMENT Right 2015  . SVT ABLATION  06/14/2014  . TUBAL LIGATION  1972  . VSD REPAIR  1958; 1967    reports that she has been smoking Cigarettes.  She has a 11.55 pack-year smoking history. She has never used smokeless tobacco. She reports that she drinks alcohol. She reports that she does not use drugs. family history includes Alcohol abuse in her other; Arthritis in her other and other; Cancer in her father, mother, and other; Hypertension in her other and unknown relative; Stroke in her other and unknown relative; Throat cancer in her unknown relative. Allergies  Allergen Reactions  . Mupirocin Other (See Comments)    Burning, pain, swelling and sob  . Codeine     Nausea    Current Outpatient Prescriptions on File Prior to Visit  Medication Sig Dispense Refill  . albuterol (VENTOLIN HFA) 108 (90 Base) MCG/ACT inhaler Inhale 2 puffs into the lungs every 6 (six) hours as needed for wheezing or shortness of breath. 18 g 11  . alendronate (FOSAMAX) 70 MG tablet Take 1 tablet (70 mg total) by mouth every 7 (seven) days. Take with a full glass of water on an empty stomach. 12 tablet 3  . ALPRAZolam (XANAX) 0.25 MG tablet Take 1 tablet (0.25 mg total) by mouth 2 (two) times daily as needed for anxiety. 60 tablet 2  . aspirin 81 MG tablet Take 1 tablet (  81 mg total) by mouth daily. 100 tablet 99  . budesonide-formoterol (SYMBICORT) 160-4.5 MCG/ACT inhaler Inhale 2 puffs into the lungs 2 (two) times daily as needed (wheezing and sob).    Marland Kitchen buPROPion (WELLBUTRIN XL) 150 MG 24 hr tablet Take 1 tablet (150 mg total) by mouth daily. 90 tablet 3  . celecoxib (CELEBREX) 100 MG capsule Take 1 capsule (100 mg total) by mouth 2 (two) times daily as needed. 60 capsule 5  . citalopram (CELEXA) 20 MG tablet Take 1 tablet (20 mg total) by mouth daily. 90 tablet 3  . diclofenac (VOLTAREN) 0.1 % ophthalmic solution 4 (four) times daily.    .  furosemide (LASIX) 40 MG tablet Take 1.5 tablets (60 mg total) by mouth daily. 45 tablet 11  . rosuvastatin (CRESTOR) 10 MG tablet TAKE 1 TABLET BY MOUTH EVERY DAY. 90 tablet 3   No current facility-administered medications on file prior to visit.    Review of Systems  Constitutional: Negative for other unusual diaphoresis or sweats HENT: Negative for ear discharge or swelling Eyes: Negative for other worsening visual disturbances Respiratory: Negative for stridor or other swelling  Gastrointestinal: Negative for worsening distension or other blood Genitourinary: Negative for retention or other urinary change Musculoskeletal: Negative for other MSK pain or swelling Skin: Negative for color change or other new lesions Neurological: Negative for worsening tremors and other numbness  Psychiatric/Behavioral: Negative for worsening agitation or other fatigue All other system neg per pt    Objective:   Physical Exam BP 116/86   Pulse 65   Temp 97.9 F (36.6 C) (Oral)   Ht 5\' 1"  (1.549 m)   Wt 115 lb (52.2 kg)   SpO2 99%   BMI 21.73 kg/m  VS noted, mild ill Constitutional: Pt appears in NAD HENT: Head: NCAT.  Right Ear: External ear normal.  Left Ear: External ear normal.  Eyes: . Pupils are equal, round, and reactive to light. Conjunctivae and EOM are normal Nose: without d/c or deformity Bilat tm's with mild erythema.  Max sinus areas mild tender.  Pharynx with mild erythema, no exudate Neck: Neck supple. Gross normal ROM Cardiovascular: Normal rate and regular rhythm.   Pulmonary/Chest: Effort normal and breath sounds decreased without rales or wheezing.  Neurological: Pt is alert. At baseline orientation, motor grossly intact Skin: Skin is warm. No rashes, other new lesions, no LE edema Psychiatric: Pt behavior is normal without agitation  No other exam findings    Assessment & Plan:

## 2016-10-08 NOTE — Assessment & Plan Note (Signed)
Mild to mod, for otc nasacort asd, to f/u any worsening symptoms or concerns ?

## 2016-10-08 NOTE — Patient Instructions (Signed)
Please take all new medication as prescribed - the antibiotic, and cough medicine if needed  OK to use the OTC Nasacort for allergies  Please continue all other medications as before, and refills have been done if requested.  Please have the pharmacy call with any other refills you may need.  Please keep your appointments with your specialists as you may have planned  Good Luck with your cath next week

## 2016-10-08 NOTE — Assessment & Plan Note (Signed)
stable overall by history and exam, recent data reviewed with pt, and pt to continue medical treatment as before,  to f/u any worsening symptoms or concerns BP Readings from Last 3 Encounters:  10/08/16 116/86  10/06/16 114/60  09/26/16 110/70

## 2016-10-08 NOTE — Assessment & Plan Note (Signed)
Mild to mod, for antibx course,  to f/u any worsening symptoms or concerns 

## 2016-10-14 ENCOUNTER — Ambulatory Visit (HOSPITAL_COMMUNITY)
Admission: RE | Admit: 2016-10-14 | Payer: Medicare Other | Source: Ambulatory Visit | Admitting: Interventional Cardiology

## 2016-10-14 ENCOUNTER — Encounter (HOSPITAL_COMMUNITY): Admission: RE | Payer: Self-pay | Source: Ambulatory Visit

## 2016-10-14 SURGERY — RIGHT/LEFT HEART CATH AND CORONARY ANGIOGRAPHY
Anesthesia: LOCAL

## 2016-10-15 ENCOUNTER — Ambulatory Visit (INDEPENDENT_AMBULATORY_CARE_PROVIDER_SITE_OTHER): Payer: Medicare Other | Admitting: Internal Medicine

## 2016-10-15 ENCOUNTER — Encounter: Payer: Self-pay | Admitting: Internal Medicine

## 2016-10-15 VITALS — BP 118/74 | HR 88 | Temp 97.8°F | Ht 61.0 in | Wt 116.0 lb

## 2016-10-15 DIAGNOSIS — J019 Acute sinusitis, unspecified: Secondary | ICD-10-CM | POA: Diagnosis not present

## 2016-10-15 DIAGNOSIS — J309 Allergic rhinitis, unspecified: Secondary | ICD-10-CM | POA: Diagnosis not present

## 2016-10-15 DIAGNOSIS — J441 Chronic obstructive pulmonary disease with (acute) exacerbation: Secondary | ICD-10-CM | POA: Diagnosis not present

## 2016-10-15 MED ORDER — METHYLPREDNISOLONE ACETATE 80 MG/ML IJ SUSP
80.0000 mg | Freq: Once | INTRAMUSCULAR | Status: AC
Start: 2016-10-15 — End: 2016-10-15
  Administered 2016-10-15: 80 mg via INTRAMUSCULAR

## 2016-10-15 MED ORDER — PREDNISONE 10 MG PO TABS: ORAL_TABLET | ORAL | 0 refills | Status: DC

## 2016-10-15 NOTE — Progress Notes (Signed)
Subjective:    Patient ID: Kimberly [name], female    DOB: [birth year], 71 y.o.   MRN: [id number]  HPI Here to f/u unfortunately with at least mild to mod worsening prod green yellow sputum with wheezing, sob/doe and just feeling terrible.  No high fever, chills but the fatigue is significant.  Pt denies chest pain, orthopnea, PND, increased LE swelling, palpitations, dizziness or syncope, though does have a post left periscapular sharp pain with cough.  Had recent doxycycline, septra and amoxil with limited success. Planned cath is forced to be delayed to next mon.  Does have several wks ongoing nasal allergy symptoms with clearish congestion, itch and sneezing, despite good nasaocrt adherence Past Medical History:  Diagnosis Date  . Anxiety   . Atrial tachycardia ([place])   . Bursitis of shoulder, right, adhesive   . CHF (congestive heart failure) ([place])   . Chronic bronchitis ([place])    "1-2 times/yr" (01/23/2014)  . COPD (chronic obstructive pulmonary disease) ([place])   . CVD (cerebrovascular disease)   . Dyslipidemia   . Dysrhythmia   . Frequency of urination   . GERD (gastroesophageal reflux disease)   . Heart murmur   . History of stomach ulcers   . HTN (hypertension) 02/22/2011  . Migraines    "stopped many years ago" (06/14/2014)  . Osteoporosis 08/19/2016  . Pericarditis   . Pneumonia "10 times" (06/14/2014)  . 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 Silent myocardial infarction ([place]) "late 1990's"  . Stress incontinence    "was suppose to have been tacked up years ago but I didn't do it"  . Syncope, near    Associated with atrial tachycardia-event recorder 1/16  . Thoracic outlet syndrome   . VSD (ventricular septal defect)    Past Surgical History:  Procedure Laterality Date  . CARDIAC CATHETERIZATION  "quite a few"  . [place]  . CHOLECYSTECTOMY OPEN  1970's  . CORONARY ANGIOGRAM  2000   No significant CAD  .  ELECTROPHYSIOLOGIC STUDY N/A 06/14/2014   Procedure: A-Flutter/A-Tach/SVT Ablation;  Surgeon: Evans Lance, MD;  Location: [facility];  Service: Cardiovascular;  Laterality: N/A;  . MYRINGOTOMY WITH TUBE PLACEMENT Right 2015  . SVT ABLATION  06/14/2014  . TUBAL LIGATION  1972  . VSD REPAIR  1958; 1967    reports that she has been smoking Cigarettes.  She has a 11.55 pack-year smoking history. She has never used smokeless tobacco. She reports that she drinks alcohol. She reports that she does not use drugs. family history includes Alcohol abuse in her other; Arthritis in her other and other; Cancer in her father, mother, and other; Hypertension in her other and unknown relative; Stroke in her other and unknown relative; Throat cancer in her unknown relative. Allergies  Allergen Reactions  . Mupirocin Other (See Comments)    Burning, pain, swelling and sob  . Codeine     Nausea    Current Outpatient Prescriptions on File Prior to Visit  Medication Sig Dispense Refill  . albuterol (VENTOLIN HFA) 108 (90 Base) MCG/ACT inhaler Inhale 2 puffs into the lungs every 6 (six) hours as needed for wheezing or shortness of breath. 18 g 11  . alendronate (FOSAMAX) 70 MG tablet Take 1 tablet (70 mg total) by mouth every 7 (seven) days. Take with a full glass of water on an empty stomach. 12 tablet 3  . ALPRAZolam (XANAX) 0.25 MG tablet Take 1 tablet (0.25  mg total) by mouth 2 (two) times daily as needed for anxiety. 60 tablet 2  . amoxicillin (AMOXIL) 500 MG capsule Take 2 capsules (1,000 mg total) by mouth 2 (two) times daily. 40 capsule 0  . aspirin 81 MG tablet Take 1 tablet (81 mg total) by mouth daily. 100 tablet 99  . budesonide-formoterol (SYMBICORT) 160-4.5 MCG/ACT inhaler Inhale 2 puffs into the lungs 2 (two) times daily as needed (wheezing and sob).    Marland Kitchen buPROPion (WELLBUTRIN XL) 150 MG 24 hr tablet Take 1 tablet (150 mg total) by mouth daily. 90 tablet 3  . celecoxib (CELEBREX) 100 MG  capsule Take 1 capsule (100 mg total) by mouth 2 (two) times daily as needed. 60 capsule 5  . chlorpheniramine-HYDROcodone (TUSSIONEX PENNKINETIC ER) 10-8 MG/5ML SUER Take 5 mLs by mouth every 12 (twelve) hours as needed for cough. 180 mL 0  . citalopram (CELEXA) 20 MG tablet Take 1 tablet (20 mg total) by mouth daily. 90 tablet 3  . diclofenac (VOLTAREN) 0.1 % ophthalmic solution 4 (four) times daily.    . furosemide (LASIX) 40 MG tablet Take 1.5 tablets (60 mg total) by mouth daily. 45 tablet 11  . rosuvastatin (CRESTOR) 10 MG tablet TAKE 1 TABLET BY MOUTH EVERY DAY. 90 tablet 3   No current facility-administered medications on file prior to visit.     Review of Systems  Constitutional: Negative for other unusual diaphoresis or sweats HENT: Negative for ear discharge or swelling Eyes: Negative for other worsening visual disturbances Respiratory: Negative for stridor or other swelling  Gastrointestinal: Negative for worsening distension or other blood Genitourinary: Negative for retention or other urinary change Musculoskeletal: Negative for other MSK pain or swelling Skin: Negative for color change or other new lesions Neurological: Negative for worsening tremors and other numbness  Psychiatric/Behavioral: Negative for worsening agitation or other fatigue All other system neg per pt    Objective:   Physical Exam BP 118/74   Pulse 88   Temp 97.8 F (36.6 C) (Oral)   Ht 5\' 1"  (1.549 m)   Wt 116 lb (52.6 kg)   SpO2 97%   BMI 21.92 kg/m  VS noted, quite fatigued Constitutional: Pt appears in NAD HENT: Head: NCAT.  Right Ear: External ear normal.  Left Ear: External ear normal.  Eyes: . Pupils are equal, round, and reactive to light. Conjunctivae and EOM are normal Nose: without d/c or deformity Bilat tm's with mild erythema.  Max sinus areas non tender.  Pharynx with mild erythema, no exudate Neck: Neck supple. Gross normal ROM Cardiovascular: Normal rate and regular rhythm.    Pulmonary/Chest: Effort normal and breath sounds somewhat course and decreased bilat left > right without rales but with few trace left > right wheezing.  Neurological: Pt is alert. At baseline orientation, motor grossly intact Skin: Skin is warm. No rashes, other new lesions, no LE edema Psychiatric: Pt behavior is normal without agitation  No other exam findings    Assessment & Plan:

## 2016-10-15 NOTE — Assessment & Plan Note (Signed)
Some improve clinically, to finish the amoxil as is

## 2016-10-15 NOTE — Assessment & Plan Note (Signed)
Good compliance with nasacort, ok to add zyrtec daily,  to f/u any worsening symptoms or concerns

## 2016-10-15 NOTE — Assessment & Plan Note (Signed)
With mild to mod worsening, for depomedrol IM 80, predpac asd, cont inhaler use , consdier add steroid inhaler

## 2016-10-15 NOTE — Patient Instructions (Addendum)
You had the steroid shot today  Please take all new medication as prescribed - the prednisone  OK to take the Zyrtec OTC daily with the nasacort  Please continue all other medications as before, and refills have been done if requested.  Please have the pharmacy call with any other refills you may need.  Please keep your appointments with your specialists as you may have planned  Good luck with the cath

## 2016-10-17 ENCOUNTER — Encounter: Payer: Self-pay | Admitting: Physician Assistant

## 2016-10-17 ENCOUNTER — Ambulatory Visit: Admission: RE | Admit: 2016-10-17 | Payer: Medicare Other | Source: Ambulatory Visit | Admitting: Cardiovascular Disease

## 2016-10-17 ENCOUNTER — Encounter: Admission: RE | Payer: Self-pay | Source: Ambulatory Visit

## 2016-10-17 ENCOUNTER — Ambulatory Visit (INDEPENDENT_AMBULATORY_CARE_PROVIDER_SITE_OTHER): Payer: Medicare Other | Admitting: Physician Assistant

## 2016-10-17 VITALS — BP 100/50 | HR 72 | Ht 61.0 in | Wt 117.2 lb

## 2016-10-17 DIAGNOSIS — S81801D Unspecified open wound, right lower leg, subsequent encounter: Secondary | ICD-10-CM | POA: Diagnosis not present

## 2016-10-17 DIAGNOSIS — I8393 Asymptomatic varicose veins of bilateral lower extremities: Secondary | ICD-10-CM | POA: Diagnosis not present

## 2016-10-17 DIAGNOSIS — I420 Dilated cardiomyopathy: Secondary | ICD-10-CM

## 2016-10-17 DIAGNOSIS — I5023 Acute on chronic systolic (congestive) heart failure: Secondary | ICD-10-CM | POA: Diagnosis not present

## 2016-10-17 DIAGNOSIS — J449 Chronic obstructive pulmonary disease, unspecified: Secondary | ICD-10-CM | POA: Diagnosis not present

## 2016-10-17 DIAGNOSIS — F17200 Nicotine dependence, unspecified, uncomplicated: Secondary | ICD-10-CM

## 2016-10-17 SURGERY — RIGHT/LEFT HEART CATH AND CORONARY ANGIOGRAPHY
Anesthesia: Moderate Sedation | Laterality: Bilateral

## 2016-10-17 MED ORDER — FUROSEMIDE 40 MG PO TABS: 60.0000 mg | ORAL_TABLET | ORAL | 3 refills | Status: DC

## 2016-10-17 NOTE — Patient Instructions (Signed)
Medication Instructions:  1. INCREASE LASIX TO 80 MG DAILY Saturday AND Sunday THEN RESUME LASIX 60 MG ALTERNATING WITH 80 MG EVERY OTHER DAY  Labwork: IN ONE WEEK ON October 24, 2016 BMET  Testing/Procedures: NONE ORDERED  Follow-Up: DR. Harrington Challenger WILL CALL YOU NEXT WEEK.   Any Other Special Instructions Will Be Listed Below (If Applicable).     If you need a refill on your cardiac medications before your next appointment, please call your pharmacy.

## 2016-10-17 NOTE — Progress Notes (Signed)
Cardiology Office Note:    Date:  10/17/2016   ID:  Kimberly [name], DOB 02-19-1945, MRN [id number]  PCP:  Biagio Borg, MD  Cardiologist:  Dr. Dorris Carnes    Referring MD: Biagio Borg, MD   Chief Complaint  Patient presents with  . Congestive Heart Failure    Follow-up    History of Present Illness:    Kimberly [name] is a 71 y.o. female with a hx of congenital heart disease status post VSD repair, SVT status post ablation, PAD, HL, COPD, tobacco abuse, systolic heart failure. She had transient atrial fibrillation during her SVT ablation. Last seen by Dr. Harrington Challenger 09/26/16.  Right left heart catheterization was discussed with the patient given her LV dysfunction. She needed improved volume status prior to proceeding as well as treatment of lower extremity wounds.  Kimberly [name] returns for follow-up. She is here with her husband. She continues to note significant dyspnea. She also has chronic chest discomfort. She describes lower substernal chest discomfort at times as well as chest pressure all day long. Overall, she feels that her chest symptoms are unchanged. She sleeps on one pillow. She denies any recent PND. Her lower extremity edema has improved with higher doses of Lasix. She denies syncope. Unfortunately, over the past several weeks, she's been on several rounds of antibiotics. She was on doxycycline for wounds on her lower extremity. She was then placed on a sulfa antibiotic for her leg wounds. Recently, she's been placed on amoxicillin for sinusitis. She's recently been given a dose of IM methylprednisolone as well as oral prednisone for COPD exacerbation.  Prior CV studies:   The following studies were reviewed today:  Echocardiogram 03/25/16 EF 15-20, diffuse HK, grade 1 diastolic dysfunction, trivial AI, moderate MR, mild LAE, moderately reduced RVSF, moderate TR  Event monitor 11/21/15 SInus rhythm with PACs and PVCs   Short burst of atrial tachycardia Sympotms of chest pressure did  not correlate with any signif arrhythmia No syncope noted    Carotid US 12/15 Bilateral ICA 1-39 Follow-up 2 years  Cardiac MRI 11/09 Final impression 1.  Mild left ventricular cavity enlargement with dyskinetic anterior ventricular septum.  Combination of full thickness scar and patchy tissue is seen in the anterior ventricular septum. 2.  Quantitative ejection fraction 36%. 3.  Normal right ventricle. 4.  No residual VSD.  Past Medical History:  Diagnosis Date  . Anxiety   . Atrial tachycardia ([place])   . Bursitis of shoulder, right, adhesive   . CHF (congestive heart failure) ([place])   . Chronic bronchitis ([place])    "1-2 times/yr" (01/23/2014)  . COPD (chronic obstructive pulmonary disease) ([place])   . CVD (cerebrovascular disease)   . Dyslipidemia   . Dysrhythmia   . Frequency of urination   . GERD (gastroesophageal reflux disease)   . Heart murmur   . History of stomach ulcers   . HTN (hypertension) 02/22/2011  . Migraines    "stopped many years ago" (06/14/2014)  . Osteoporosis 08/19/2016  . Pericarditis   . Pneumonia "10 times" (06/14/2014)  . 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 Silent myocardial infarction ([place]) "late 1990's"  . Stress incontinence    "was suppose to have been tacked up years ago but I didn't do it"  . Syncope, near    Associated with atrial tachycardia-event recorder 1/16  . Thoracic outlet syndrome   . VSD (ventricular septal defect)     Past Surgical History:  Procedure Laterality Date  . CARDIAC CATHETERIZATION  "quite a few"  . [place]  . CHOLECYSTECTOMY OPEN  1970's  . CORONARY ANGIOGRAM  2000   No significant CAD  . ELECTROPHYSIOLOGIC STUDY N/A 06/14/2014   Procedure: A-Flutter/A-Tach/SVT Ablation;  Surgeon: Evans Lance, MD;  Location: [facility];  Service: Cardiovascular;  Laterality: N/A;  . MYRINGOTOMY WITH TUBE PLACEMENT Right 2015  . SVT ABLATION  06/14/2014  . TUBAL  LIGATION  1972  . VSD REPAIR  1958; 1967    Current Medications: Current Meds  Medication Sig  . albuterol (VENTOLIN HFA) 108 (90 Base) MCG/ACT inhaler Inhale 2 puffs into the lungs every 6 (six) hours as needed for wheezing or shortness of breath.  Marland Kitchen alendronate (FOSAMAX) 70 MG tablet Take 1 tablet (70 mg total) by mouth every 7 (seven) days. Take with a full glass of water on an empty stomach.  . ALPRAZolam (XANAX) 0.25 MG tablet Take 1 tablet (0.25 mg total) by mouth 2 (two) times daily as needed for anxiety.  Marland Kitchen amoxicillin (AMOXIL) 500 MG capsule Take 2 capsules (1,000 mg total) by mouth 2 (two) times daily.  Marland Kitchen aspirin 81 MG tablet Take 1 tablet (81 mg total) by mouth daily.  . budesonide-formoterol (SYMBICORT) 160-4.5 MCG/ACT inhaler Inhale 2 puffs into the lungs 2 (two) times daily as needed (wheezing and sob).  Marland Kitchen buPROPion (WELLBUTRIN XL) 150 MG 24 hr tablet Take 1 tablet (150 mg total) by mouth daily.  . celecoxib (CELEBREX) 100 MG capsule Take 1 capsule (100 mg total) by mouth 2 (two) times daily as needed.  . chlorpheniramine-HYDROcodone (TUSSIONEX PENNKINETIC ER) 10-8 MG/5ML SUER Take 5 mLs by mouth every 12 (twelve) hours as needed for cough.  . citalopram (CELEXA) 20 MG tablet Take 1 tablet (20 mg total) by mouth daily.  . diclofenac (VOLTAREN) 0.1 % ophthalmic solution 4 (four) times daily.  . predniSONE (DELTASONE) 10 MG tablet 4 tab by mouth x3day,3tab x 3day,2tab x 3day,1tab x 3day  . rosuvastatin (CRESTOR) 10 MG tablet TAKE 1 TABLET BY MOUTH EVERY DAY.  . [DISCONTINUED] furosemide (LASIX) 40 MG tablet Take 1.5 tablets (60 mg total) by mouth daily.     Allergies:   Mupirocin and Codeine   Social History   Social History  . Marital status: Married    Spouse name: N/A  . Number of children: N/A  . Years of education: 80   Occupational History  . retired    Social History Main Topics  . Smoking status: Current Every Day Smoker    Packs/day: 0.33    Years: 35.00     Types: Cigarettes  . Smokeless tobacco: Never Used     Comment: Smoked 1971-present , up to 1/2 ppd. Intermittent smoking cessation with preganancy up to 1/2-1 year @ a time  . Alcohol use Yes     Comment: 06/14/2014 "glass of wine maybe once/month"  . Drug use: No  . Sexual activity: No   Other Topics Concern  . None   Social History Narrative   Pt lives with husband.     Family Hx: The patient's family history includes Alcohol abuse in her other; Arthritis in her other and other; Cancer in her father, mother, and other; Hypertension in her other and unknown relative; Stroke in her other and unknown relative; Throat cancer in her unknown relative. There is no history of Breast cancer.  ROS:   Please see the history of present illness.  Review of Systems  Constitution: Positive for fever.  Cardiovascular: Positive for chest pain and dyspnea on exertion.  Respiratory: Positive for wheezing.   Musculoskeletal: Positive for joint pain.  Neurological: Positive for dizziness and headaches.   All other systems reviewed and are negative.   EKGs/Labs/Other Test Reviewed:    EKG:  EKG is  ordered today.  The ekg ordered today demonstrates NSR, HR 62, normal axis, RBBB, PVC, IVCD, T-wave inversions V3-V5, QTc 440 ms, similar to prior tracing  Recent Labs: 07/25/2016: TSH 0.99 08/26/2016: ALT 10 10/01/2016: NT-Pro BNP 3,221 10/06/2016: BUN 14; Creatinine, Ser 1.27; Hemoglobin 13.9; Platelets 273; Potassium 4.6; Sodium 144   Recent Lipid Panel Lab Results  Component Value Date/Time   CHOL 150 07/25/2016 11:29 AM   TRIG 55.0 07/25/2016 11:29 AM   HDL 78.50 07/25/2016 11:29 AM   CHOLHDL 2 07/25/2016 11:29 AM   LDLCALC 61 07/25/2016 11:29 AM   LDLDIRECT 112.6 03/16/2013 10:57 AM    Physical Exam:    VS:  BP (!) 100/50   Pulse 72   Ht 5\' 1"  (1.549 m)   Wt 117 lb 3.2 oz (53.2 kg)   SpO2 92%   BMI 22.14 kg/m     Wt Readings from Last 3 Encounters:  10/17/16 117 lb 3.2 oz (53.2  kg)  10/15/16 116 lb (52.6 kg)  10/08/16 115 lb (52.2 kg)     Physical Exam  Constitutional: She is oriented to person, place, and time. She appears well-developed and well-nourished. No distress.  HENT:  Head: Normocephalic and atraumatic.  Eyes: No scleral icterus.  Neck: JVD present.  Cardiovascular: Normal rate and regular rhythm.   Murmur heard.  Holosystolic murmur is present with a grade of 3/6  at the lower left sternal border Pulmonary/Chest: She has decreased breath sounds. She has wheezes in the right lower field and the left lower field.  Abdominal: Soft.  Musculoskeletal: She exhibits edema (1+ bilat LE edema).  Neurological: She is alert and oriented to person, place, and time.  Skin: Skin is warm and dry.  Faint scrape along anterior shin R leg; 2 eschars post R leg (calf)  Psychiatric: She has a normal mood and affect.    ASSESSMENT:    1. Acute on chronic systolic (congestive) heart failure ([place])   2. Dilated cardiomyopathy ([place])   3. Varicose veins of both lower extremities   4. Chronic obstructive pulmonary disease, unspecified COPD type ([place])   5. TOBACCO ABUSE   6. Wound of right lower extremity, subsequent encounter    PLAN:    In order of problems listed above:  1. Acute on chronic systolic (congestive) heart failure ([place])  She remains volume overloaded. She is NYHA 3. EF is 15-20. Dr. Harrington Challenger also saw the patient today. The patient needs to undergo cardiac catheterization to further evaluate her cardiomyopathy. However, given her current fluid status as well as her recent COPD exacerbation, will need to hold off for 1-2 weeks.   -  Increase Lasix to 80 mg daily 2 days, then resume 60 mg alternated with 80 mg every other day  -  BMET 1 week  2. Dilated cardiomyopathy ([place]) As noted, cardiac catheterization needs to be done. She was to have a right and left heart catheterization with Dr. Fletcher Anon at [facility] today. However, given her current volume status  and recent COPD exacerbation, the cardiac catheterization was canceled. Dr. Harrington Challenger will be in touch with the patient in the next week to determine the  timing of her cardiac catheterization.  3. Varicose veins of both lower extremities This likely contributes to her edema in addition to her heart failure.   4. Chronic obstructive pulmonary disease, unspecified COPD type ([place]) Recent exacerbation. She is currently on prednisone. She's finishing antibiotics for sinusitis. She is not moving much air on exam and is wheezing somewhat.  O2 sats today on room air with ambulation remained above 90%.  5. TOBACCO ABUSE She knows that she needs to quit.  6. Wound of right lower extremity, subsequent encounter Her leg wounds are improved.   Dispo:  Return Dr. Harrington Challenger to call to arrange cardiac cath.   Medication Adjustments/Labs and Tests Ordered: Current medicines are reviewed at length with the patient today.  Concerns regarding medicines are outlined above.  Tests Ordered: Orders Placed This Encounter  Procedures  . Basic Metabolic Panel (BMET)  . EKG 12-Lead   Medication Changes: Meds ordered this encounter  Medications  . furosemide (LASIX) 40 MG tablet    Sig: Take 1.5 tablets (60 mg total) by mouth as directed. Alternate 60 mg with 80 mg every other day as directed    Dispense:  105 tablet    Refill:  3    Signed, Richardson Dopp, PA-C  10/17/2016 1:58 PM    [facility] [address], [place], [place]  [address] Phone: [phone]; Fax: [phone]

## 2016-10-23 ENCOUNTER — Other Ambulatory Visit: Payer: Medicare Other

## 2016-10-23 ENCOUNTER — Other Ambulatory Visit
Admission: RE | Admit: 2016-10-23 | Discharge: 2016-10-23 | Disposition: A | Discharge status: Home / Self Care | Payer: Medicare Other | Source: Ambulatory Visit | Attending: Physician Assistant | Admitting: Physician Assistant

## 2016-10-23 ENCOUNTER — Telehealth: Payer: Self-pay | Admitting: *Deleted

## 2016-10-23 DIAGNOSIS — I5023 Acute on chronic systolic (congestive) heart failure: Secondary | ICD-10-CM | POA: Insufficient documentation

## 2016-10-23 LAB — BASIC METABOLIC PANEL
Anion gap: 10 (ref 5–15)
BUN: 21 mg/dL — AB (ref 6–20)
CALCIUM: 9.2 mg/dL (ref 8.9–10.3)
CHLORIDE: 96 mmol/L — AB (ref 101–111)
CO2: 33 mmol/L — AB (ref 22–32)
Creatinine, Ser: 0.96 mg/dL (ref 0.44–1.00)
GFR calc non Af Amer: 59 mL/min — ABNORMAL LOW (ref >60)
GLUCOSE: 103 mg/dL — AB (ref 65–99)
Potassium: 3.8 mmol/L (ref 3.5–5.1)
Sodium: 139 mmol/L (ref 135–145)

## 2016-10-23 NOTE — Telephone Encounter (Signed)
-----   Message from Liliane Shi, [place] sent at 10/23/2016  3:27 PM EDT ----- Please call the patient Kidney function, potassium normal. Continue with current treatment plan. Richardson Dopp, PA-C   10/23/2016 3:27 PM

## 2016-10-23 NOTE — Telephone Encounter (Signed)
Pt has been notified of lab results by phone with verbal understanding. Pt thanked me for my call.  

## 2016-10-23 NOTE — Progress Notes (Signed)
Pt came for labs    Leg is healing well.

## 2016-10-24 ENCOUNTER — Other Ambulatory Visit: Payer: Medicare Other

## 2016-10-28 ENCOUNTER — Telehealth: Payer: Self-pay | Admitting: Internal Medicine

## 2016-10-28 DIAGNOSIS — Z01812 Encounter for preprocedural laboratory examination: Secondary | ICD-10-CM

## 2016-10-28 NOTE — Telephone Encounter (Signed)
Feels a lot better. Finished amoxil yesterday. Still cough, runny nose, no congestion.  Cough sounds harsh, is productive for clear sputum.  Leg wounds look "good, a lot better".  Tired. Little bit of swelling in right leg. No fever.  Continues lasix 60 every other day alternating with 80 every other day. Called to update on how she's feeling.  Wants to have cath done if Dr. Harrington Challenger thinks she is ready. She prefers to schedule in [facility] with Dr. Fletcher Anon. Prefers next week so that she doesn't have to repeat labs. She is aware we will call her back after Dr. Harrington Challenger reviews/recommnendations.

## 2016-10-28 NOTE — Telephone Encounter (Signed)
New message   Pt verbalized that she is calling for RN to see about her surgery

## 2016-10-29 NOTE — Telephone Encounter (Signed)
Go ahead and schedule  Pt will probably need repeat precath labs

## 2016-10-31 NOTE — Telephone Encounter (Signed)
[facility] scheduling--got a voice mail.  Message left to call back so that patient can get scheduled for L and R heart cath (for SOB/chest pressure/LV dysfunction) with Dr. Fletcher Anon.    From Dr. Tyrell Antonio nurse:  Looking at Dr. Tyrell Antonio schedule, he could probably cath Mrs. Heinsohn October 18 at 8:30am or Oct 19 at 7:30am. If either of these work for the patient, call scheduling, [phone] and speak with Pamala Hurry. If you need other date choices, let me know - will be glad to help.

## 2016-11-03 NOTE — Telephone Encounter (Signed)
Received call back from [facility] scheduling. Pt is now scheduled for Thurs 10/18 at 8:30 am with Dr. Fletcher Anon for R and L heart cath.   Pt is to arrive by 7:30 am. Spoke with patient and informed.   Aware of where to go and will arrive by 7:30 am.   She will take all medications like normal and will be NPO after midnight.  Pre cath labs tomorrow at [facility].

## 2016-11-04 ENCOUNTER — Other Ambulatory Visit
Admission: RE | Admit: 2016-11-04 | Discharge: 2016-11-04 | Disposition: A | Discharge status: Home / Self Care | Payer: Medicare Other | Source: Ambulatory Visit | Attending: Internal Medicine | Admitting: Internal Medicine

## 2016-11-04 DIAGNOSIS — Z01812 Encounter for preprocedural laboratory examination: Secondary | ICD-10-CM | POA: Diagnosis not present

## 2016-11-04 LAB — CBC WITH DIFFERENTIAL/PLATELET
Basophils Absolute: 0.1 10*3/uL (ref 0–0.1)
Basophils Relative: 1 %
EOS PCT: 1 %
Eosinophils Absolute: 0.1 10*3/uL (ref 0–0.7)
HCT: 46.2 % (ref 35.0–47.0)
Hemoglobin: 15.3 g/dL (ref 12.0–16.0)
LYMPHS ABS: 1.4 10*3/uL (ref 1.0–3.6)
LYMPHS PCT: 19 %
MCH: 29 pg (ref 26.0–34.0)
MCHC: 33.2 g/dL (ref 32.0–36.0)
MCV: 87.6 fL (ref 80.0–100.0)
MONO ABS: 0.5 10*3/uL (ref 0.2–0.9)
MONOS PCT: 7 %
Neutro Abs: 5.4 10*3/uL (ref 1.4–6.5)
Neutrophils Relative %: 72 %
PLATELETS: 235 10*3/uL (ref 150–440)
RBC: 5.28 MIL/uL — ABNORMAL HIGH (ref 3.80–5.20)
RDW: 15.7 % — AB (ref 11.5–14.5)
WBC: 7.4 10*3/uL (ref 3.6–11.0)

## 2016-11-04 LAB — BASIC METABOLIC PANEL
Anion gap: 12 (ref 5–15)
BUN: 16 mg/dL (ref 6–20)
CALCIUM: 9.7 mg/dL (ref 8.9–10.3)
CO2: 29 mmol/L (ref 22–32)
CREATININE: 0.93 mg/dL (ref 0.44–1.00)
Chloride: 97 mmol/L — ABNORMAL LOW (ref 101–111)
GFR calc non Af Amer: >60 mL/min (ref >60)
GLUCOSE: 122 mg/dL — AB (ref 65–99)
Potassium: 3.5 mmol/L (ref 3.5–5.1)
Sodium: 138 mmol/L (ref 135–145)

## 2016-11-04 LAB — PROTIME-INR
INR: 1.02
Prothrombin Time: 13.3 seconds (ref 11.4–15.2)

## 2016-11-06 ENCOUNTER — Ambulatory Visit
Admission: RE | Admit: 2016-11-06 | Discharge: 2016-11-06 | Disposition: A | Discharge status: Home / Self Care | Payer: Medicare Other | Source: Ambulatory Visit | Attending: Cardiovascular Disease | Admitting: Cardiovascular Disease

## 2016-11-06 ENCOUNTER — Encounter: Payer: Self-pay | Admitting: Emergency Medicine

## 2016-11-06 ENCOUNTER — Telehealth: Payer: Self-pay | Admitting: Internal Medicine

## 2016-11-06 ENCOUNTER — Encounter
Admission: RE | Disposition: A | Discharge status: Home / Self Care | Payer: Self-pay | Source: Ambulatory Visit | Attending: Cardiovascular Disease

## 2016-11-06 DIAGNOSIS — F17210 Nicotine dependence, cigarettes, uncomplicated: Secondary | ICD-10-CM | POA: Insufficient documentation

## 2016-11-06 DIAGNOSIS — E785 Hyperlipidemia, unspecified: Secondary | ICD-10-CM | POA: Diagnosis not present

## 2016-11-06 DIAGNOSIS — I252 Old myocardial infarction: Secondary | ICD-10-CM | POA: Insufficient documentation

## 2016-11-06 DIAGNOSIS — K219 Gastro-esophageal reflux disease without esophagitis: Secondary | ICD-10-CM | POA: Insufficient documentation

## 2016-11-06 DIAGNOSIS — I2510 Atherosclerotic heart disease of native coronary artery without angina pectoris: Secondary | ICD-10-CM | POA: Insufficient documentation

## 2016-11-06 DIAGNOSIS — Z7982 Long term (current) use of aspirin: Secondary | ICD-10-CM | POA: Insufficient documentation

## 2016-11-06 DIAGNOSIS — I110 Hypertensive heart disease with heart failure: Secondary | ICD-10-CM | POA: Diagnosis not present

## 2016-11-06 DIAGNOSIS — Z79899 Other long term (current) drug therapy: Secondary | ICD-10-CM | POA: Diagnosis not present

## 2016-11-06 DIAGNOSIS — J449 Chronic obstructive pulmonary disease, unspecified: Secondary | ICD-10-CM | POA: Insufficient documentation

## 2016-11-06 DIAGNOSIS — F419 Anxiety disorder, unspecified: Secondary | ICD-10-CM | POA: Diagnosis not present

## 2016-11-06 DIAGNOSIS — M810 Age-related osteoporosis without current pathological fracture: Secondary | ICD-10-CM | POA: Diagnosis not present

## 2016-11-06 DIAGNOSIS — I429 Cardiomyopathy, unspecified: Secondary | ICD-10-CM | POA: Insufficient documentation

## 2016-11-06 DIAGNOSIS — I5023 Acute on chronic systolic (congestive) heart failure: Secondary | ICD-10-CM | POA: Diagnosis not present

## 2016-11-06 HISTORY — PX: RIGHT/LEFT HEART CATH AND CORONARY ANGIOGRAPHY: CATH118266

## 2016-11-06 SURGERY — RIGHT/LEFT HEART CATH AND CORONARY ANGIOGRAPHY
Anesthesia: Moderate Sedation | Laterality: Bilateral

## 2016-11-06 MED ORDER — FENTANYL CITRATE (PF) 100 MCG/2ML IJ SOLN
INTRAMUSCULAR | Status: AC
  Filled 2016-11-06: qty 2

## 2016-11-06 MED ORDER — SODIUM CHLORIDE 0.9% FLUSH: 3.0000 mL | Freq: Two times a day (BID) | INTRAVENOUS | Status: DC

## 2016-11-06 MED ORDER — SODIUM CHLORIDE 0.9 % IV SOLN: 250.0000 mL | INTRAVENOUS | Status: DC | PRN

## 2016-11-06 MED ORDER — ASPIRIN 81 MG PO CHEW: 81.0000 mg | CHEWABLE_TABLET | ORAL | Status: DC

## 2016-11-06 MED ORDER — MIDAZOLAM HCL 2 MG/2ML IJ SOLN
INTRAMUSCULAR | Status: AC
  Filled 2016-11-06: qty 2

## 2016-11-06 MED ORDER — MIDAZOLAM HCL 2 MG/2ML IJ SOLN
INTRAMUSCULAR | Status: DC | PRN
  Administered 2016-11-06: 1 mg via INTRAVENOUS

## 2016-11-06 MED ORDER — IOPAMIDOL (ISOVUE-300) INJECTION 61%
INTRAVENOUS | Status: DC | PRN
  Administered 2016-11-06: 75 mL via INTRA_ARTERIAL

## 2016-11-06 MED ORDER — SODIUM CHLORIDE 0.9 % IV SOLN
INTRAVENOUS | Status: DC
  Administered 2016-11-06: 08:00:00 via INTRAVENOUS

## 2016-11-06 MED ORDER — SODIUM CHLORIDE 0.9% FLUSH: 3.0000 mL | INTRAVENOUS | Status: DC | PRN

## 2016-11-06 MED ORDER — FENTANYL CITRATE (PF) 100 MCG/2ML IJ SOLN
INTRAMUSCULAR | Status: DC | PRN
  Administered 2016-11-06: 25 ug via INTRAVENOUS

## 2016-11-06 MED ORDER — HEPARIN (PORCINE) IN NACL 2-0.9 UNIT/ML-% IJ SOLN
INTRAMUSCULAR | Status: AC
  Filled 2016-11-06: qty 500

## 2016-11-06 MED ORDER — CARVEDILOL 3.125 MG PO TABS: 3.1250 mg | ORAL_TABLET | Freq: Two times a day (BID) | ORAL | 3 refills | Status: DC

## 2016-11-06 SURGICAL SUPPLY — 12 items
CATH INFINITI 5FR ANG PIGTAIL (CATHETERS) ×3 IMPLANT
CATH INFINITI 5FR JL4 (CATHETERS) ×3 IMPLANT
CATH INFINITI JR4 5F (CATHETERS) ×3 IMPLANT
CATH SWANZ 7F THERMO (CATHETERS) ×3 IMPLANT
GUIDEWIRE EMER 3M J .025X150CM (WIRE) ×3 IMPLANT
KIT MANI 3VAL PERCEP (MISCELLANEOUS) ×3 IMPLANT
KIT RIGHT HEART (MISCELLANEOUS) ×3 IMPLANT
NEEDLE PERC 18GX7CM (NEEDLE) ×3 IMPLANT
PACK CARDIAC CATH (CUSTOM PROCEDURE TRAY) ×3 IMPLANT
SHEATH AVANTI 5FR X 11CM (SHEATH) ×3 IMPLANT
SHEATH PINNACLE 7F 10CM (SHEATH) ×3 IMPLANT
WIRE EMERALD 3MM-J .035X150CM (WIRE) ×3 IMPLANT

## 2016-11-06 NOTE — Interval H&P Note (Signed)
History and Physical Interval Note:  11/06/2016 8:58 AM  Kimberly [name]  has presented today for surgery, with the diagnosis of LT and RT cath    Shortness of breath  LV dysfunction  The various methods of treatment have been discussed with the patient and family. After consideration of risks, benefits and other options for treatment, the patient has consented to  Procedure(s): RIGHT/LEFT HEART CATH AND CORONARY ANGIOGRAPHY (Bilateral) as a surgical intervention .  The patient's history has been reviewed, patient examined, no change in status, stable for surgery.  I have reviewed the patient's chart and labs.  Questions were answered to the patient's satisfaction.     Kathlyn Sacramento

## 2016-11-06 NOTE — Progress Notes (Signed)
Patient anxious and expressing desire and to go home at this time.  Spoke to Dr Fletcher Anon and forwarded patients complaint.  MD recommends to keep bedrest per order, forwarded MDs recommendation to patient. Patient agreeable to stay per orders at this time.

## 2016-11-06 NOTE — H&P (View-Only) (Signed)
Cardiology Office Note:    Date:  10/17/2016   ID:  Kimberly [name], DOB 1945/02/22, MRN [id number]  PCP:  Biagio Borg, MD  Cardiologist:  Dr. Dorris Carnes    Referring MD: Biagio Borg, MD   Chief Complaint  Patient presents with  . Congestive Heart Failure    Follow-up    History of Present Illness:    Kimberly [name] is a 71 y.o. female with a hx of congenital heart disease status post VSD repair, SVT status post ablation, PAD, HL, COPD, tobacco abuse, systolic heart failure. She had transient atrial fibrillation during her SVT ablation. Last seen by Dr. Harrington Challenger 09/26/16.  Right left heart catheterization was discussed with the patient given her LV dysfunction. She needed improved volume status prior to proceeding as well as treatment of lower extremity wounds.  Kimberly [name] returns for follow-up. She is here with her husband. She continues to note significant dyspnea. She also has chronic chest discomfort. She describes lower substernal chest discomfort at times as well as chest pressure all day long. Overall, she feels that her chest symptoms are unchanged. She sleeps on one pillow. She denies any recent PND. Her lower extremity edema has improved with higher doses of Lasix. She denies syncope. Unfortunately, over the past several weeks, she's been on several rounds of antibiotics. She was on doxycycline for wounds on her lower extremity. She was then placed on a sulfa antibiotic for her leg wounds. Recently, she's been placed on amoxicillin for sinusitis. She's recently been given a dose of IM methylprednisolone as well as oral prednisone for COPD exacerbation.  Prior CV studies:   The following studies were reviewed today:  Echocardiogram 03/25/16 EF 15-20, diffuse HK, grade 1 diastolic dysfunction, trivial AI, moderate MR, mild LAE, moderately reduced RVSF, moderate TR  Event monitor 11/21/15 SInus rhythm with PACs and PVCs   Short burst of atrial tachycardia Sympotms of chest pressure did  not correlate with any signif arrhythmia No syncope noted    Carotid US 12/15 Bilateral ICA 1-39 Follow-up 2 years  Cardiac MRI 11/09 Final impression 1.  Mild left ventricular cavity enlargement with dyskinetic anterior ventricular septum.  Combination of full thickness scar and patchy tissue is seen in the anterior ventricular septum. 2.  Quantitative ejection fraction 36%. 3.  Normal right ventricle. 4.  No residual VSD.  Past Medical History:  Diagnosis Date  . Anxiety   . Atrial tachycardia ([place])   . Bursitis of shoulder, right, adhesive   . CHF (congestive heart failure) ([place])   . Chronic bronchitis ([place])    "1-2 times/yr" (01/23/2014)  . COPD (chronic obstructive pulmonary disease) ([place])   . CVD (cerebrovascular disease)   . Dyslipidemia   . Dysrhythmia   . Frequency of urination   . GERD (gastroesophageal reflux disease)   . Heart murmur   . History of stomach ulcers   . HTN (hypertension) 02/22/2011  . Migraines    "stopped many years ago" (06/14/2014)  . Osteoporosis 08/19/2016  . Pericarditis   . Pneumonia "10 times" (06/14/2014)  . 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 Silent myocardial infarction ([place]) "late 1990's"  . Stress incontinence    "was suppose to have been tacked up years ago but I didn't do it"  . Syncope, near    Associated with atrial tachycardia-event recorder 1/16  . Thoracic outlet syndrome   . VSD (ventricular septal defect)     Past Surgical History:  Procedure Laterality Date  . CARDIAC CATHETERIZATION  "quite a few"  . [place]  . CHOLECYSTECTOMY OPEN  1970's  . CORONARY ANGIOGRAM  2000   No significant CAD  . ELECTROPHYSIOLOGIC STUDY N/A 06/14/2014   Procedure: A-Flutter/A-Tach/SVT Ablation;  Surgeon: Evans Lance, MD;  Location: [facility];  Service: Cardiovascular;  Laterality: N/A;  . MYRINGOTOMY WITH TUBE PLACEMENT Right 2015  . SVT ABLATION  06/14/2014  . TUBAL  LIGATION  1972  . VSD REPAIR  1958; 1967    Current Medications: Current Meds  Medication Sig  . albuterol (VENTOLIN HFA) 108 (90 Base) MCG/ACT inhaler Inhale 2 puffs into the lungs every 6 (six) hours as needed for wheezing or shortness of breath.  Marland Kitchen alendronate (FOSAMAX) 70 MG tablet Take 1 tablet (70 mg total) by mouth every 7 (seven) days. Take with a full glass of water on an empty stomach.  . ALPRAZolam (XANAX) 0.25 MG tablet Take 1 tablet (0.25 mg total) by mouth 2 (two) times daily as needed for anxiety.  Marland Kitchen amoxicillin (AMOXIL) 500 MG capsule Take 2 capsules (1,000 mg total) by mouth 2 (two) times daily.  Marland Kitchen aspirin 81 MG tablet Take 1 tablet (81 mg total) by mouth daily.  . budesonide-formoterol (SYMBICORT) 160-4.5 MCG/ACT inhaler Inhale 2 puffs into the lungs 2 (two) times daily as needed (wheezing and sob).  Marland Kitchen buPROPion (WELLBUTRIN XL) 150 MG 24 hr tablet Take 1 tablet (150 mg total) by mouth daily.  . celecoxib (CELEBREX) 100 MG capsule Take 1 capsule (100 mg total) by mouth 2 (two) times daily as needed.  . chlorpheniramine-HYDROcodone (TUSSIONEX PENNKINETIC ER) 10-8 MG/5ML SUER Take 5 mLs by mouth every 12 (twelve) hours as needed for cough.  . citalopram (CELEXA) 20 MG tablet Take 1 tablet (20 mg total) by mouth daily.  . diclofenac (VOLTAREN) 0.1 % ophthalmic solution 4 (four) times daily.  . predniSONE (DELTASONE) 10 MG tablet 4 tab by mouth x3day,3tab x 3day,2tab x 3day,1tab x 3day  . rosuvastatin (CRESTOR) 10 MG tablet TAKE 1 TABLET BY MOUTH EVERY DAY.  . [DISCONTINUED] furosemide (LASIX) 40 MG tablet Take 1.5 tablets (60 mg total) by mouth daily.     Allergies:   Mupirocin and Codeine   Social History   Social History  . Marital status: Married    Spouse name: N/A  . Number of children: N/A  . Years of education: 80   Occupational History  . retired    Social History Main Topics  . Smoking status: Current Every Day Smoker    Packs/day: 0.33    Years: 35.00     Types: Cigarettes  . Smokeless tobacco: Never Used     Comment: Smoked 1971-present , up to 1/2 ppd. Intermittent smoking cessation with preganancy up to 1/2-1 year @ a time  . Alcohol use Yes     Comment: 06/14/2014 "glass of wine maybe once/month"  . Drug use: No  . Sexual activity: No   Other Topics Concern  . None   Social History Narrative   Pt lives with husband.     Family Hx: The patient's family history includes Alcohol abuse in her other; Arthritis in her other and other; Cancer in her father, mother, and other; Hypertension in her other and unknown relative; Stroke in her other and unknown relative; Throat cancer in her unknown relative. There is no history of Breast cancer.  ROS:   Please see the history of present illness.  Review of Systems  Constitution: Positive for fever.  Cardiovascular: Positive for chest pain and dyspnea on exertion.  Respiratory: Positive for wheezing.   Musculoskeletal: Positive for joint pain.  Neurological: Positive for dizziness and headaches.   All other systems reviewed and are negative.   EKGs/Labs/Other Test Reviewed:    EKG:  EKG is  ordered today.  The ekg ordered today demonstrates NSR, HR 62, normal axis, RBBB, PVC, IVCD, T-wave inversions V3-V5, QTc 440 ms, similar to prior tracing  Recent Labs: 07/25/2016: TSH 0.99 08/26/2016: ALT 10 10/01/2016: NT-Pro BNP 3,221 10/06/2016: BUN 14; Creatinine, Ser 1.27; Hemoglobin 13.9; Platelets 273; Potassium 4.6; Sodium 144   Recent Lipid Panel Lab Results  Component Value Date/Time   CHOL 150 07/25/2016 11:29 AM   TRIG 55.0 07/25/2016 11:29 AM   HDL 78.50 07/25/2016 11:29 AM   CHOLHDL 2 07/25/2016 11:29 AM   LDLCALC 61 07/25/2016 11:29 AM   LDLDIRECT 112.6 03/16/2013 10:57 AM    Physical Exam:    VS:  BP (!) 100/50   Pulse 72   Ht 5\' 1"  (1.549 m)   Wt 117 lb 3.2 oz (53.2 kg)   SpO2 92%   BMI 22.14 kg/m     Wt Readings from Last 3 Encounters:  10/17/16 117 lb 3.2 oz (53.2  kg)  10/15/16 116 lb (52.6 kg)  10/08/16 115 lb (52.2 kg)     Physical Exam  Constitutional: She is oriented to person, place, and time. She appears well-developed and well-nourished. No distress.  HENT:  Head: Normocephalic and atraumatic.  Eyes: No scleral icterus.  Neck: JVD present.  Cardiovascular: Normal rate and regular rhythm.   Murmur heard.  Holosystolic murmur is present with a grade of 3/6  at the lower left sternal border Pulmonary/Chest: She has decreased breath sounds. She has wheezes in the right lower field and the left lower field.  Abdominal: Soft.  Musculoskeletal: She exhibits edema (1+ bilat LE edema).  Neurological: She is alert and oriented to person, place, and time.  Skin: Skin is warm and dry.  Faint scrape along anterior shin R leg; 2 eschars post R leg (calf)  Psychiatric: She has a normal mood and affect.    ASSESSMENT:    1. Acute on chronic systolic (congestive) heart failure ([place])   2. Dilated cardiomyopathy ([place])   3. Varicose veins of both lower extremities   4. Chronic obstructive pulmonary disease, unspecified COPD type ([place])   5. TOBACCO ABUSE   6. Wound of right lower extremity, subsequent encounter    PLAN:    In order of problems listed above:  1. Acute on chronic systolic (congestive) heart failure ([place])  She remains volume overloaded. She is NYHA 3. EF is 15-20. Dr. Harrington Challenger also saw the patient today. The patient needs to undergo cardiac catheterization to further evaluate her cardiomyopathy. However, given her current fluid status as well as her recent COPD exacerbation, will need to hold off for 1-2 weeks.   -  Increase Lasix to 80 mg daily 2 days, then resume 60 mg alternated with 80 mg every other day  -  BMET 1 week  2. Dilated cardiomyopathy ([place]) As noted, cardiac catheterization needs to be done. She was to have a right and left heart catheterization with Dr. Fletcher Anon at [facility] today. However, given her current volume status  and recent COPD exacerbation, the cardiac catheterization was canceled. Dr. Harrington Challenger will be in touch with the patient in the next week to determine the  timing of her cardiac catheterization.  3. Varicose veins of both lower extremities This likely contributes to her edema in addition to her heart failure.   4. Chronic obstructive pulmonary disease, unspecified COPD type ([place]) Recent exacerbation. She is currently on prednisone. She's finishing antibiotics for sinusitis. She is not moving much air on exam and is wheezing somewhat.  O2 sats today on room air with ambulation remained above 90%.  5. TOBACCO ABUSE She knows that she needs to quit.  6. Wound of right lower extremity, subsequent encounter Her leg wounds are improved.   Dispo:  Return Dr. Harrington Challenger to call to arrange cardiac cath.   Medication Adjustments/Labs and Tests Ordered: Current medicines are reviewed at length with the patient today.  Concerns regarding medicines are outlined above.  Tests Ordered: Orders Placed This Encounter  Procedures  . Basic Metabolic Panel (BMET)  . EKG 12-Lead   Medication Changes: Meds ordered this encounter  Medications  . furosemide (LASIX) 40 MG tablet    Sig: Take 1.5 tablets (60 mg total) by mouth as directed. Alternate 60 mg with 80 mg every other day as directed    Dispense:  105 tablet    Refill:  3    Signed, Richardson Dopp, PA-C  10/17/2016 1:58 PM    [facility] [place], [place], [place]  [address] Phone: [phone]; Fax: [phone]

## 2016-11-06 NOTE — Telephone Encounter (Signed)
TOC   11/06/2016 10:27 AM   Nicki Reaper Kathlen Mody

## 2016-11-07 NOTE — Telephone Encounter (Signed)
Patient contacted regarding discharge from [facility] hospital on 11/06/16.  Patient understands to follow up with provider Richardson Dopp, PA on 11/21/16 at 8:45 at [facility] in [place]. Patient understands discharge instructions? Yes Patient understands medications and regiment? Yes Patient understands to bring all medications to this visit? Yes  The pt states she is sore at her cath site and very sleepy today. She states that she felt the same way after her last cath. She denies CP, SOB or dizziness. She is advised that the soreness at her cath site and sleepiness will subside with time and that if they worsen to call us back. She verbalized understanding and thanked me for calling her. She does have [place] phone number to call if she has any questions or concerns.

## 2016-11-19 ENCOUNTER — Encounter: Payer: Self-pay | Admitting: Physician Assistant

## 2016-11-19 ENCOUNTER — Ambulatory Visit (INDEPENDENT_AMBULATORY_CARE_PROVIDER_SITE_OTHER): Payer: Medicare Other | Admitting: Physician Assistant

## 2016-11-19 VITALS — BP 106/50 | HR 67 | Ht 61.0 in | Wt 113.8 lb

## 2016-11-19 DIAGNOSIS — I739 Peripheral vascular disease, unspecified: Secondary | ICD-10-CM

## 2016-11-19 DIAGNOSIS — F17200 Nicotine dependence, unspecified, uncomplicated: Secondary | ICD-10-CM | POA: Diagnosis not present

## 2016-11-19 DIAGNOSIS — I5042 Chronic combined systolic (congestive) and diastolic (congestive) heart failure: Secondary | ICD-10-CM | POA: Diagnosis not present

## 2016-11-19 MED ORDER — LOSARTAN POTASSIUM 25 MG PO TABS
25.0000 mg | ORAL_TABLET | Freq: Every day | ORAL | 3 refills | Status: DC
Start: 2016-11-19 — End: 2017-02-02

## 2016-11-19 NOTE — Patient Instructions (Signed)
Medication Instructions:  1. START LOSARTAN 25 MG ONCE DAILY. PRESCRIPTION HAS BEEN SENT TO PHARMACY  Labwork: BMET IN 1 WEEK TO BE DONE IN [place]  Testing/Procedures: NONE ORDERED  Follow-Up: DR. Harrington Challenger OR CARE TEAM IN 3-4 WEEKS  BEING REFERRED TO DR. Fletcher Anon FOR PAD  Any Other Special Instructions Will Be Listed Below (If Applicable).     If you need a refill on your cardiac medications before your next appointment, please call your pharmacy.

## 2016-11-19 NOTE — Progress Notes (Signed)
Cardiology Office Note:    Date:  11/19/2016   ID:  Kimberly [name], DOB 01-12-1946, MRN [id number]  PCP:  Kimberly Borg, MD  Cardiologist:  Dr. Dorris [name]    Referring MD: Kimberly Borg, MD   Chief Complaint  Patient presents with  . Hospitalization Follow-up    Status post cardiac catheterization    History of Present Illness:    Kimberly [name] is a 71 y.o. female with a hx of congenital heart disease status post VSD repair, SVT status post ablation, PAD, HL, COPD, tobacco abuse, systolic heart failure. She had transient atrial fibrillation during her SVT ablation.  She has had worsening volume excess.  EF was 15-20 on echocardiogram in March 2018.  Cardiac catheterization was recommended.  She has some difficulty with COPD exacerbation which delayed her procedure.  She ultimately went for cardiac catheterization 11/06/16.  This demonstrated minimal nonobstructive CAD, mild pulmonary hypertension, severely reduced cardiac output and evidence of PAD with subtotal occlusion of the right common femoral artery.  Carvedilol was added for heart failure management.  [name] returns for follow-up.  She is here with her husband.  Her breathing remains short but is overall stable.  She denies PND.  Her lower extremity edema is improved.  Her weights have been down.  She continues have a cough at bedtime.  She continues to smoke.  She has occasional substernal chest discomfort as well as left-sided "cramp."  She does note right lower extremity claudication with walking.  She also notes that she is depressed and is tearful at times.  She has tried Cymbalta and Celexa in the past.  Prior CV studies:   The following studies were reviewed today:  R/L Cardiac Catheterization 11/06/16 Minimal nonobstructive CAD Severely reduced LVEF 15-20, at least moderate MR Mild pulmonary hypertension, severely reduced cardiac output (CO 1.96, CI 1.33) Subtotal occlusion right common femoral artery  Echocardiogram  03/25/16 EF 15-20, diffuse HK, grade 1 diastolic dysfunction, trivial AI, moderate MR, mild LAE, moderately reduced RVSF, moderate TR   Event monitor 11/21/15 SInus rhythm with PACs and PVCs   Short burst of atrial tachycardia Sympotms of chest pressure did not correlate with any signif arrhythmia No syncope noted     Carotid US 12/15 Bilateral ICA 1-39 Follow-up 2 years   Cardiac MRI 11/09 Final impression 1.  Mild left ventricular cavity enlargement with dyskinetic anterior ventricular septum.  Combination of full thickness scar and patchy tissue is seen in the anterior ventricular septum. 2.  Quantitative ejection fraction 36%. 3.  Normal right ventricle. 4.  No residual VSD.   Past Medical History:  Diagnosis Date  . Anxiety   . Atrial tachycardia ([place])   . Bursitis of shoulder, right, adhesive   . CHF (congestive heart failure) ([place])   . Chronic bronchitis ([place])    "1-2 times/yr" (01/23/2014)  . COPD (chronic obstructive pulmonary disease) ([place])   . CVD (cerebrovascular disease)   . Dyslipidemia   . Dysrhythmia   . Frequency of urination   . GERD (gastroesophageal reflux disease)   . Heart murmur   . History of stomach ulcers   . HTN (hypertension) 02/22/2011  . Migraines    "stopped many years ago" (06/14/2014)  . Osteoporosis 08/19/2016  . Pericarditis   . Pneumonia "10 times" (06/14/2014)  . 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 Silent myocardial infarction ([place]) "late 1990's"  . Stress incontinence    "was suppose to have  been tacked up years ago but I didn't do it"  . Syncope, near    Associated with atrial tachycardia-event recorder 1/16  . Thoracic outlet syndrome   . VSD (ventricular septal defect)     Past Surgical History:  Procedure Laterality Date  . CARDIAC CATHETERIZATION  "quite a few"  . [place]  . CHOLECYSTECTOMY OPEN  1970's  . CORONARY ANGIOGRAM  2000   No significant CAD  .  ELECTROPHYSIOLOGIC STUDY N/A 06/14/2014   Procedure: A-Flutter/A-Tach/SVT Ablation;  Surgeon: Evans Lance, MD;  Location: [facility];  Service: Cardiovascular;  Laterality: N/A;  . MYRINGOTOMY WITH TUBE PLACEMENT Right 2015  . RIGHT/LEFT HEART CATH AND CORONARY ANGIOGRAPHY Bilateral 11/06/2016   Procedure: RIGHT/LEFT HEART CATH AND CORONARY ANGIOGRAPHY;  Surgeon: Wellington Hampshire, MD;  Location: [facility];  Service: Cardiovascular;  Laterality: Bilateral;  . SVT ABLATION  06/14/2014  . TUBAL LIGATION  1972  . VSD REPAIR  1958; 1967    Current Medications: Current Meds  Medication Sig  . albuterol (VENTOLIN HFA) 108 (90 Base) MCG/ACT inhaler Inhale 2 puffs into the lungs every 6 (six) hours as needed for wheezing or shortness of breath.  . ALPRAZolam (XANAX) 0.25 MG tablet Take 1 tablet (0.25 mg total) by mouth 2 (two) times daily as needed for anxiety.  Marland Kitchen aspirin 81 MG tablet Take 1 tablet (81 mg total) by mouth daily.  . carvedilol (COREG) 3.125 MG tablet Take 1 tablet (3.125 mg total) by mouth 2 (two) times daily.  . celecoxib (CELEBREX) 100 MG capsule Take 100 mg by mouth 2 (two) times daily as needed for moderate pain.  . furosemide (LASIX) 40 MG tablet Take 1.5 tablets (60 mg total) by mouth as directed. Alternate 60 mg with 80 mg every other day as directed  . rosuvastatin (CRESTOR) 10 MG tablet TAKE 1 TABLET BY MOUTH EVERY DAY.     Allergies:   Mupirocin and Codeine   Social History  Substance Use Topics  . Smoking status: Current Every Day Smoker    Packs/day: 0.33    Years: 35.00    Types: Cigarettes  . Smokeless tobacco: Never Used     Comment: Smoked 1971-present , up to 1/2 ppd. Intermittent smoking cessation with preganancy up to 1/2-1 year @ a time  . Alcohol use Yes     Comment: 06/14/2014 "glass of wine maybe once/month"     Family Hx: The patient's family history includes Alcohol abuse in her other; Arthritis in her other and other; Cancer in  her father, mother, and other; Hypertension in her other and unknown relative; Stroke in her other and unknown relative; Throat cancer in her unknown relative. There is no history of Breast cancer.  ROS:   Please see the history of present illness.    Review of Systems  Cardiovascular: Positive for chest pain, dyspnea on exertion, irregular heartbeat and leg swelling.  Respiratory: Positive for cough, shortness of breath and wheezing.   Musculoskeletal: Positive for joint pain.  Neurological: Positive for dizziness.  Psychiatric/Behavioral: Positive for depression. The patient is nervous/anxious.    All other systems reviewed and are negative.   EKGs/Labs/Other Test Reviewed:    EKG:  EKG is  ordered today.  The ekg ordered today demonstrates normal sinus rhythm, heart rate 67, RBBB, inferolateral T wave inversions, QTC 467 ms, no change from prior tracing  Recent Labs: 07/25/2016: TSH 0.99 08/26/2016: ALT 10 10/01/2016: NT-Pro BNP 3,221 11/04/2016: BUN  16; Creatinine, Ser 0.93; Hemoglobin 15.3; Platelets 235; Potassium 3.5; Sodium 138   Recent Lipid Panel Lab Results  Component Value Date/Time   CHOL 150 07/25/2016 11:29 AM   TRIG 55.0 07/25/2016 11:29 AM   HDL 78.50 07/25/2016 11:29 AM   CHOLHDL 2 07/25/2016 11:29 AM   LDLCALC 61 07/25/2016 11:29 AM   LDLDIRECT 112.6 03/16/2013 10:57 AM    Physical Exam:    VS:  BP (!) 106/50   Pulse 67   Ht 5\' 1"  (1.549 m)   Wt 113 lb 12.8 oz (51.6 kg)   SpO2 96%   BMI 21.50 kg/m     Wt Readings from Last 3 Encounters:  11/19/16 113 lb 12.8 oz (51.6 kg)  11/06/16 112 lb (50.8 kg)  10/17/16 117 lb 3.2 oz (53.2 kg)     Physical Exam  Constitutional: She is oriented to person, place, and time. She appears well-developed and well-nourished. No distress.  HENT:  Head: Normocephalic and atraumatic.  Neck: No JVD present.  Cardiovascular: Normal rate and regular rhythm.   Murmur heard.  Systolic murmur is present with a grade of 2/6   at the lower left sternal border Pulmonary/Chest: Effort normal. She has no rales.  Abdominal: Soft.  Musculoskeletal: She exhibits no edema or deformity (Right femoral arteriotomy site without hematoma or bruit).  Neurological: She is alert and oriented to person, place, and time.  Skin: Skin is warm and dry.    ASSESSMENT:    1. Chronic combined systolic and diastolic heart failure ([place])   2. PAD (peripheral artery disease) ([place])   3. TOBACCO ABUSE    PLAN:    In order of problems listed above:  1.  Chronic combined systolic and diastolic heart failure (HCC)  EF 15-20%.  She has no significant CAD on cardiac catheterization.  Her cardiac output is significantly depressed.  She has New York Heart Association class III symptoms.  Currently, her volume appears to be stable.  She is tolerating low-dose beta-blocker.  I have recommended that we start low-dose of angiotensin receptor blocker.  Continue current dose of Lasix.  I will discuss further with Dr. Harrington Challenger whether or not to refer her to the advanced heart failure clinic.  -Start losartan 25 mg daily  -BMET 1 week  -Discuss with Dr. Harrington Challenger whether or not to refer to AHF clinic  2.  PAD (peripheral artery disease) ([place]) - Plan: Ambulatory referral to Cardiology She had a noted occlusion of the right common femoral artery at the time of her cardiac catheterization.  She does note right lower extreme claudication.  She has also had slow to heal ulcers on her leg.  I will refer her to Dr. Fletcher Anon.  I will discuss with him whether or not she needs arterial Dopplers prior to his visit.  3.  TOBACCO ABUSE I have asked her to quit smoking.   Dispo:  Return in about 4 weeks (around 12/17/2016) for Close Follow Up, w/ Dr. Harrington Challenger.   Medication Adjustments/Labs and Tests Ordered: Current medicines are reviewed at length with the patient today.  Concerns regarding medicines are outlined above.  Tests Ordered: Orders Placed This Encounter    Procedures  . Basic Metabolic Panel (BMET)  . Ambulatory referral to Cardiology  . EKG 12-Lead   Medication Changes: Meds ordered this encounter  Medications  . losartan (COZAAR) 25 MG tablet    Sig: Take 1 tablet (25 mg total) by mouth daily.    Dispense:  90 tablet  Refill:  3    Signed, Richardson Dopp, PA-C  11/19/2016 4:59 PM    [facility] [place], [place], [place]  [address] Phone: [phone]; Fax: [phone]

## 2016-11-20 ENCOUNTER — Ambulatory Visit (INDEPENDENT_AMBULATORY_CARE_PROVIDER_SITE_OTHER): Payer: Medicare Other | Admitting: Nurse Practitioner

## 2016-11-20 ENCOUNTER — Encounter: Payer: Self-pay | Admitting: Nurse Practitioner

## 2016-11-20 ENCOUNTER — Telehealth: Payer: Self-pay | Admitting: Nurse Practitioner

## 2016-11-20 ENCOUNTER — Telehealth: Payer: Self-pay | Admitting: Physician Assistant

## 2016-11-20 ENCOUNTER — Other Ambulatory Visit: Payer: Medicare Other

## 2016-11-20 VITALS — BP 102/58 | HR 71 | Temp 98.2°F | Ht 61.0 in | Wt 114.0 lb

## 2016-11-20 DIAGNOSIS — L03115 Cellulitis of right lower limb: Secondary | ICD-10-CM | POA: Diagnosis not present

## 2016-11-20 DIAGNOSIS — S81801D Unspecified open wound, right lower leg, subsequent encounter: Secondary | ICD-10-CM

## 2016-11-20 DIAGNOSIS — I739 Peripheral vascular disease, unspecified: Secondary | ICD-10-CM

## 2016-11-20 MED ORDER — SACCHAROMYCES BOULARDII 250 MG PO CAPS: 250.0000 mg | ORAL_CAPSULE | Freq: Two times a day (BID) | ORAL | 0 refills | Status: DC

## 2016-11-20 MED ORDER — CLINDAMYCIN HCL 150 MG PO CAPS: 150.0000 mg | ORAL_CAPSULE | Freq: Three times a day (TID) | ORAL | 0 refills | Status: DC

## 2016-11-20 MED ORDER — SILVER HYDROGEL EX GEL: 1.0000 [in_us] | Freq: Two times a day (BID) | CUTANEOUS | 0 refills | Status: DC

## 2016-11-20 NOTE — Progress Notes (Signed)
Subjective:  Patient ID: Kimberly [name], female    DOB: Mar 15, 1945  Age: 71 y.o. MRN: [id number]  CC: Leg Injury (cut lower right leg,smell,red,swelling,odor,yellow drainage---got cut 3 mo ago/saw different doc but not better. )  HPI  Wound since 08/2016. Waxing and waning. Treated with doxycycline and bactrim, some improvement. Odor and drainage started again earlier this week. Worsening pain, denies reinjuring leg. No fever Pain with weight bearing.  Outpatient Medications Prior to Visit  Medication Sig Dispense Refill  . albuterol (VENTOLIN HFA) 108 (90 Base) MCG/ACT inhaler Inhale 2 puffs into the lungs every 6 (six) hours as needed for wheezing or shortness of breath. 18 g 11  . ALPRAZolam (XANAX) 0.25 MG tablet Take 1 tablet (0.25 mg total) by mouth 2 (two) times daily as needed for anxiety. 60 tablet 2  . aspirin 81 MG tablet Take 1 tablet (81 mg total) by mouth daily. 100 tablet 99  . carvedilol (COREG) 3.125 MG tablet Take 1 tablet (3.125 mg total) by mouth 2 (two) times daily. 60 tablet 3  . celecoxib (CELEBREX) 100 MG capsule Take 100 mg by mouth 2 (two) times daily as needed for moderate pain.    . furosemide (LASIX) 40 MG tablet Take 1.5 tablets (60 mg total) by mouth as directed. Alternate 60 mg with 80 mg every other day as directed 105 tablet 3  . losartan (COZAAR) 25 MG tablet Take 1 tablet (25 mg total) by mouth daily. 90 tablet 3  . rosuvastatin (CRESTOR) 10 MG tablet TAKE 1 TABLET BY MOUTH EVERY DAY. 90 tablet 3   No facility-administered medications prior to visit.     ROS See HPI  Objective:  BP (!) 102/58   Pulse 71   Temp 98.2 F (36.8 C)   Ht 5\' 1"  (1.549 m)   Wt 114 lb (51.7 kg)   SpO2 97%   BMI 21.54 kg/m   BP Readings from Last 3 Encounters:  11/20/16 (!) 102/58  11/19/16 (!) 106/50  11/06/16 119/60    Wt Readings from Last 3 Encounters:  11/20/16 114 lb (51.7 kg)  11/19/16 113 lb 12.8 oz (51.6 kg)  11/06/16 112 lb (50.8 kg)     Physical Exam  Constitutional: She is oriented to person, place, and time.  Musculoskeletal: She exhibits edema and tenderness. She exhibits no deformity.       Right ankle: Normal. She exhibits normal range of motion. No tenderness.       Right lower leg: She exhibits tenderness and swelling.       Legs:      Right foot: Normal.  Neurological: She is alert and oriented to person, place, and time.  Skin: There is erythema.    Lab Results  Component Value Date   WBC 7.4 11/04/2016   HGB 15.3 11/04/2016   HCT 46.2 11/04/2016   PLT 235 11/04/2016   GLUCOSE 122 (H) 11/04/2016   CHOL 150 07/25/2016   TRIG 55.0 07/25/2016   HDL 78.50 07/25/2016   LDLDIRECT 112.6 03/16/2013   LDLCALC 61 07/25/2016   ALT 10 08/26/2016   AST 15 08/26/2016   NA 138 11/04/2016   K 3.5 11/04/2016   CL 97 (L) 11/04/2016   CREATININE 0.93 11/04/2016   BUN 16 11/04/2016   CO2 29 11/04/2016   TSH 0.99 07/25/2016   INR 1.02 11/04/2016    Assessment & Plan:   Dajia was seen today for leg injury.  Diagnoses and all orders for this visit:  Open leg wound, right, subsequent encounter -     Wound culture; Future -     Ambulatory referral to Wound Clinic -     sulfamethoxazole-trimethoprim (BACTRIM DS,SEPTRA DS) 800-160 MG tablet; Take 1 tablet 2 (two) times daily by mouth.  Cellulitis of right lower extremity -     Wound culture; Future -     Ambulatory referral to Wound Clinic -     saccharomyces boulardii (FLORASTOR) 250 MG capsule; Take 1 capsule (250 mg total) by mouth 2 (two) times daily. -     Discontinue: clindamycin (CLEOCIN) 150 MG capsule; Take 1 capsule (150 mg total) by mouth 3 (three) times daily. -     SILVER HYDROGEL GEL; Apply 1 inch topically 2 (two) times daily. -     sulfamethoxazole-trimethoprim (BACTRIM DS,SEPTRA DS) 800-160 MG tablet; Take 1 tablet 2 (two) times daily by mouth.   I have discontinued Jalesha B. Ticas's clindamycin. I am also having her start on saccharomyces  boulardii, SILVER HYDROGEL, and sulfamethoxazole-trimethoprim. Additionally, I am having her maintain her rosuvastatin, ALPRAZolam, aspirin, albuterol, furosemide, carvedilol, celecoxib, and losartan.  Meds ordered this encounter  Medications  . saccharomyces boulardii (FLORASTOR) 250 MG capsule    Sig: Take 1 capsule (250 mg total) by mouth 2 (two) times daily.    Dispense:  20 capsule    Refill:  0    Order Specific Question:   Supervising Provider    Answer:   Cassandria Anger [[id number]]  . DISCONTD: clindamycin (CLEOCIN) 150 MG capsule    Sig: Take 1 capsule (150 mg total) by mouth 3 (three) times daily.    Dispense:  21 capsule    Refill:  0    Order Specific Question:   Supervising Provider    Answer:   Cassandria Anger [[id number]]  . SILVER HYDROGEL GEL    Sig: Apply 1 inch topically 2 (two) times daily.    Dispense:  1 Tube    Refill:  0    Order Specific Question:   Supervising Provider    Answer:   Cassandria Anger [[id number]]  . sulfamethoxazole-trimethoprim (BACTRIM DS,SEPTRA DS) 800-160 MG tablet    Sig: Take 1 tablet 2 (two) times daily by mouth.    Dispense:  20 tablet    Refill:  0    Order Specific Question:   Supervising Provider    Answer:   Cassandria Anger [[id number]]    Follow-up: No Follow-up on file.  Wilfred Lacy, NP

## 2016-11-20 NOTE — Patient Instructions (Addendum)
Change dressing 1-2times a day as needed.  You will be contacted to schedule appt with wound clinic.  You will be contacted with wound culture results.   Wound culture is positive for staphylococcus aureus which is resistant to current oral abx prescribed. Stop clindamycin and start bactrim. Follow up with wound clinic when scheduled. Also clean wound with hibiclens soap (OTC) twice a day

## 2016-11-20 NOTE — Telephone Encounter (Addendum)
I reviewed her case with Dr.  Fletcher Anon.  She should have ABIs and lower extremity arterial Dopplers prior to her consultation with him for peripheral arterial disease.  I also reviewed her case with Dr. Dorris Carnes.  She should be seen in the CHF Clinic as well.  Please arrange ABIs and lower extremity arterial Dopplers. Please refer to CHF Clinic (prefer she see Dr. Haroldine Laws or Dr. Aundra Dubin in [place]).  Richardson Dopp, PA-C 11/20/2016 10:55 AM

## 2016-11-20 NOTE — Telephone Encounter (Signed)
Notified pharmacy, they are aware to switch both med.

## 2016-11-20 NOTE — Telephone Encounter (Signed)
Ok to switch to Designer, industrial/product (same instruction)

## 2016-11-20 NOTE — Telephone Encounter (Signed)
-----   Message from Wellington Hampshire, MD sent at 11/19/2016  5:21 PM EDT ----- Yes please. Thanks Event organiser.    ----- Message ----- From: Liliane Shi, PA-C Sent: 11/19/2016   5:00 PM To: Wellington Hampshire, MD  [place] - Does she need LE arterial dopplers before she sees you? Thanks, [employer]

## 2016-11-20 NOTE — Telephone Encounter (Signed)
Pharmacy called.  States that silver Hydrogel is not in stock.  States that they can get resta silver gel in stock.  Can get ordered overnight out of stock.  States resta silver is 55 parts per million silver in a 1 1/2 once tube.  States also has hydrogel AG in a 4 oz tube that is also silver gel.  States hyrdo gel might be more of a dressing.   Also sent script for florastor - needs to know if generic can be used.  Requesting call back in regard.

## 2016-11-20 NOTE — Telephone Encounter (Signed)
Please advise 

## 2016-11-21 ENCOUNTER — Ambulatory Visit: Payer: Medicare Other | Admitting: Physician Assistant

## 2016-11-21 NOTE — Telephone Encounter (Signed)
S/w pt who has been advised of recommendations to be seen by the HF Clinic as well as needing LEA w/ABI's before her appt with Dr. Fletcher Anon 11/13. Pt is agreeable to plan of care. Pt aware our office will call her today to schedule testing and schedule HF Clinic appt. Pt thanked me for my call.

## 2016-11-21 NOTE — Telephone Encounter (Signed)
Tried to reach pt though no answer or machine. Need to go over recommendations per Brynda Rim. PA . Per Brynda Rim. PA and Dr. Fletcher Anon pt is being referred to Dr. Fletcher Anon. Pt will need LEA w/ABI's to be done before she see's Dr. Fletcher Anon. Per Brynda Rim PA and Dr. Harrington Challenger pt needs to f/u with HF Clinic Dr. Haroldine Laws or Dr. Aundra Dubin in [place].

## 2016-11-23 LAB — WOUND CULTURE
MICRO NUMBER:: 81227539
SPECIMEN QUALITY:: ADEQUATE

## 2016-11-23 MED ORDER — SULFAMETHOXAZOLE-TRIMETHOPRIM 800-160 MG PO TABS: 1.0000 | ORAL_TABLET | Freq: Two times a day (BID) | ORAL | 0 refills | Status: DC

## 2016-11-25 ENCOUNTER — Telehealth (HOSPITAL_COMMUNITY): Payer: Self-pay | Admitting: Cardiology

## 2016-11-25 ENCOUNTER — Telehealth: Payer: Self-pay | Admitting: Internal Medicine

## 2016-11-25 NOTE — Telephone Encounter (Signed)
Patient calling, states that she has a cut on her right leg that Dr. Harrington Challenger is aware of. Patient states that she was informed that she has staff infection and wanted to make Dr. Harrington Challenger aware.

## 2016-11-25 NOTE — Telephone Encounter (Signed)
Pt returned my call.  She is aware of New pt appt with Dr.McLean and new patient packet has been mailed to pt.

## 2016-11-25 NOTE — Telephone Encounter (Signed)
Left message for patient to call the office, need to give her appointment info for New CHF appt.

## 2016-11-25 NOTE — Telephone Encounter (Signed)
Pt calling to give Dr Harrington Challenger and RN an update/diagnosis for her right swollen leg.  Pt states she saw Baldo Ash NP with internal med, and pt was diagnosed with Baker's Cyst of the right leg, with active staph infection in that area. Pt states she is on antibiotics, creams, and hepacleanse. Pt states that she will go for her lab appt tomorrow, as previously scheduled.  Pt states Dr Harrington Challenger can view all notes NP documented at her 11/1 OV, in [place].  Informed the pt that both Dr Harrington Challenger and RN are out of the office today, but I will route this message to them, for their review upon return to the office.  Pt verbalized understanding and agrees with this plan.

## 2016-11-26 ENCOUNTER — Other Ambulatory Visit: Payer: Self-pay

## 2016-11-26 ENCOUNTER — Telehealth: Payer: Self-pay

## 2016-11-26 ENCOUNTER — Other Ambulatory Visit
Admission: RE | Admit: 2016-11-26 | Discharge: 2016-11-26 | Disposition: A | Discharge status: Home / Self Care | Payer: Medicare Other | Source: Ambulatory Visit | Attending: Physician Assistant | Admitting: Physician Assistant

## 2016-11-26 DIAGNOSIS — I5042 Chronic combined systolic (congestive) and diastolic (congestive) heart failure: Secondary | ICD-10-CM

## 2016-11-26 LAB — BASIC METABOLIC PANEL
ANION GAP: 10 (ref 5–15)
BUN: 19 mg/dL (ref 6–20)
CALCIUM: 8.7 mg/dL — AB (ref 8.9–10.3)
CO2: 29 mmol/L (ref 22–32)
Chloride: 96 mmol/L — ABNORMAL LOW (ref 101–111)
Creatinine, Ser: 1.25 mg/dL — ABNORMAL HIGH (ref 0.44–1.00)
GFR, EST AFRICAN AMERICAN: 50 mL/min — AB (ref >60)
GFR, EST NON AFRICAN AMERICAN: 43 mL/min — AB (ref >60)
Glucose, Bld: 120 mg/dL — ABNORMAL HIGH (ref 65–99)
POTASSIUM: 3.3 mmol/L — AB (ref 3.5–5.1)
Sodium: 135 mmol/L (ref 135–145)

## 2016-11-26 MED ORDER — FUROSEMIDE 40 MG PO TABS: 40.0000 mg | ORAL_TABLET | Freq: Every day | ORAL | 3 refills | Status: DC

## 2016-11-26 MED ORDER — POTASSIUM CHLORIDE ER 10 MEQ PO TBCR: 10.0000 meq | EXTENDED_RELEASE_TABLET | Freq: Every day | ORAL | 3 refills | Status: DC

## 2016-11-26 NOTE — Telephone Encounter (Signed)
Called patient about lab results. Per Richardson Dopp PA, potassium is low, creatinine is elevated, hold lasix for 1 day, then resume Lasix 40 mg daily, add potassium 10 meq daily, hold losartan for 2 days, then resume, and repeat BMET 1 week. Patient has appointment with Dr. Fletcher Anon on 12/02/16 and she would like lab work done there on that day. Sent prescription in for potassium and lasix to patient's pharmacy of choice. Patient verbalized understanding.

## 2016-11-26 NOTE — Telephone Encounter (Signed)
-----   Message from Liliane Shi, [place] sent at 11/26/2016 12:14 PM EST ----- Please call patient. Potassium is low, creatinine is elevated. Hold Lasix for 1 day Then resume Lasix 40 mg daily. Add potassium 10 mEq daily Hold losartan for 2 days, then resume Repeat BMET 1 week Richardson Dopp, PA-C    11/26/2016 12:13 PM

## 2016-11-26 NOTE — Telephone Encounter (Signed)
Reviewed visit   Agree with plans of NP I cannot see eval by Kathleen Argue

## 2016-11-27 ENCOUNTER — Other Ambulatory Visit: Payer: Self-pay | Admitting: Chiropractic Medicine

## 2016-11-27 DIAGNOSIS — I739 Peripheral vascular disease, unspecified: Secondary | ICD-10-CM

## 2016-11-28 NOTE — Telephone Encounter (Signed)
Routing copy of recommendations by Richardson Dopp, APP  (found under labs 11/7) for Dr. Alan Ripper review.

## 2016-11-28 NOTE — Telephone Encounter (Signed)
  Michaelyn Barter, RN  Registered Nurse    Telephone Encounter  Signed  Encounter Date:  11/26/2016          Signed           [] Hide copied text  [] Hover for details   Called patient about lab results. Per Richardson Dopp PA, potassium is low, creatinine is elevated, hold lasix for 1 day, then resume Lasix 40 mg daily, add potassium 10 meq daily, hold losartan for 2 days, then resume, and repeat BMET 1 week. Patient has appointment with Dr. Fletcher Anon on 12/02/16 and she would like lab work done there on that day. Sent prescription in for potassium and lasix to patient's pharmacy of choice. Patient verbalized understanding.          Electronically signed by Michaelyn Barter, RN at 11/26/2016 1:15 PM

## 2016-12-02 ENCOUNTER — Other Ambulatory Visit (INDEPENDENT_AMBULATORY_CARE_PROVIDER_SITE_OTHER): Payer: Medicare Other

## 2016-12-02 ENCOUNTER — Other Ambulatory Visit: Payer: Self-pay

## 2016-12-02 ENCOUNTER — Other Ambulatory Visit: Payer: Self-pay | Admitting: Physician Assistant

## 2016-12-02 ENCOUNTER — Ambulatory Visit: Payer: Medicare Other | Admitting: Cardiovascular Disease

## 2016-12-02 DIAGNOSIS — I10 Essential (primary) hypertension: Secondary | ICD-10-CM

## 2016-12-02 DIAGNOSIS — Z01812 Encounter for preprocedural laboratory examination: Secondary | ICD-10-CM | POA: Diagnosis not present

## 2016-12-02 DIAGNOSIS — I471 Supraventricular tachycardia: Secondary | ICD-10-CM | POA: Diagnosis not present

## 2016-12-04 ENCOUNTER — Other Ambulatory Visit: Payer: Self-pay | Admitting: Chiropractic Medicine

## 2016-12-04 DIAGNOSIS — I739 Peripheral vascular disease, unspecified: Secondary | ICD-10-CM

## 2016-12-04 LAB — BASIC METABOLIC PANEL
BUN / CREAT RATIO: 10 — AB (ref 12–28)
BUN: 10 mg/dL (ref 8–27)
CO2: 26 mmol/L (ref 20–29)
CREATININE: 1.03 mg/dL — AB (ref 0.57–1.00)
Calcium: 9 mg/dL (ref 8.7–10.3)
Chloride: 94 mmol/L — ABNORMAL LOW (ref 96–106)
GFR calc Af Amer: 64 mL/min/{1.73_m2} (ref >59)
GFR, EST NON AFRICAN AMERICAN: 56 mL/min/{1.73_m2} — AB (ref >59)
Glucose: 123 mg/dL — ABNORMAL HIGH (ref 65–99)
Potassium: 4.4 mmol/L (ref 3.5–5.2)
SODIUM: 136 mmol/L (ref 134–144)

## 2016-12-05 ENCOUNTER — Encounter: Payer: Medicare Other | Attending: Surgery | Admitting: Surgery

## 2016-12-05 ENCOUNTER — Ambulatory Visit (INDEPENDENT_AMBULATORY_CARE_PROVIDER_SITE_OTHER): Payer: Medicare Other

## 2016-12-05 ENCOUNTER — Telehealth: Payer: Self-pay | Admitting: *Deleted

## 2016-12-05 DIAGNOSIS — F17218 Nicotine dependence, cigarettes, with other nicotine-induced disorders: Secondary | ICD-10-CM | POA: Diagnosis not present

## 2016-12-05 DIAGNOSIS — I739 Peripheral vascular disease, unspecified: Secondary | ICD-10-CM

## 2016-12-05 DIAGNOSIS — I890 Lymphedema, not elsewhere classified: Secondary | ICD-10-CM | POA: Insufficient documentation

## 2016-12-05 DIAGNOSIS — L97212 Non-pressure chronic ulcer of right calf with fat layer exposed: Secondary | ICD-10-CM | POA: Insufficient documentation

## 2016-12-05 DIAGNOSIS — I420 Dilated cardiomyopathy: Secondary | ICD-10-CM | POA: Diagnosis not present

## 2016-12-05 DIAGNOSIS — Z79899 Other long term (current) drug therapy: Secondary | ICD-10-CM | POA: Diagnosis not present

## 2016-12-05 DIAGNOSIS — J449 Chronic obstructive pulmonary disease, unspecified: Secondary | ICD-10-CM | POA: Insufficient documentation

## 2016-12-05 DIAGNOSIS — S81801A Unspecified open wound, right lower leg, initial encounter: Secondary | ICD-10-CM | POA: Diagnosis not present

## 2016-12-05 DIAGNOSIS — G43909 Migraine, unspecified, not intractable, without status migrainosus: Secondary | ICD-10-CM | POA: Diagnosis not present

## 2016-12-05 DIAGNOSIS — E785 Hyperlipidemia, unspecified: Secondary | ICD-10-CM | POA: Insufficient documentation

## 2016-12-05 DIAGNOSIS — I110 Hypertensive heart disease with heart failure: Secondary | ICD-10-CM | POA: Insufficient documentation

## 2016-12-05 DIAGNOSIS — I509 Heart failure, unspecified: Secondary | ICD-10-CM | POA: Diagnosis not present

## 2016-12-05 NOTE — Telephone Encounter (Signed)
Pt has been notified of lab results by phone with verbal understanding. Pt states she has changed her lasix on own and started alternating 40 mg every other day with 20 mg lasix every other day. I asked pt if she has had any increase in weight or sob. Pt states she is always sob because of her heart. I explained to her because of the heart failure is the reason why she was on lasix 40 mg daily. I asked her to please resume lasix 40 mg daily as directed and to keep her appt with Cecilie Kicks, NP 12/18/16. Advised pt we do not want fluid to build up around her heart. Pt is agreeable to plan of care. Pt did state to me she is upset that she has not been able to see Dr. Harrington Challenger in sometime and would like to see her as well. She was told it would be in January. I scheduled pt to see Dr. Harrington Challenger 02/16/17 @ 8:20. Pt thanked me for my call today.

## 2016-12-05 NOTE — Telephone Encounter (Signed)
-----   Message from Burtis Junes, NP sent at 12/05/2016 11:54 AM EST ----- Reviewed for Richardson Dopp, PA Ok to report. Labs are stable - potassium is better - would continue on current regimen.

## 2016-12-06 NOTE — Progress Notes (Signed)
DELAYNIE, STETZER ([id number]) Visit Report for 12/05/2016 Abuse/Suicide Risk Screen Details Patient Name: Kimberly [name], Kimberly [name] Date of Service: 12/05/2016 10:30 AM Medical Record Number: [id number] Patient Account Number: [id number] Date of Birth/Sex: September 04, 1945 (71 y.o. Female) Treating RN: Ahmed Prima Primary Care Finas Delone: Cathlean Cower Other Clinician: Referring Vera Furniss: Wilfred Lacy Treating Amybeth Sieg/Extender: Frann Rider in Treatment: 0 Abuse/Suicide Risk Screen Items Answer ABUSE/SUICIDE RISK SCREEN: Has anyone close to you tried to hurt or harm you recentlyo No Do you feel uncomfortable with anyone in your familyo No Has anyone forced you do things that you didnot want to doo No Do you have any thoughts of harming yourselfo No Patient displays signs or symptoms of abuse and/or neglect. No Electronic Signature(s) Signed: 12/05/2016 4:11:32 PM By: Alric Quan Entered By: Alric Quan on 12/05/2016 10:33:58 Kimberly [name] ([id number]) -------------------------------------------------------------------------------- Activities of Daily Living Details Patient Name: Kimberly [name] Date of Service: 12/05/2016 10:30 AM Medical Record Number: [id number] Patient Account Number: [id number] Date of Birth/Sex: 10-29-1945 (71 y.o. Female) Treating RN: Ahmed Prima Primary Care Macyn Remmert: Cathlean Cower Other Clinician: Referring Artavis Cowie: Wilfred Lacy Treating Tyshan Enderle/Extender: Frann Rider in Treatment: 0 Activities of Daily Living Items Answer Activities of Daily Living (Please select one for each item) Drive Automobile Completely Able Take Medications Completely Able Use Telephone Completely Able Care for Appearance Completely Able Use Toilet Completely Able Bath / Shower Completely Able Dress Self Completely Able Feed Self Completely Able Walk Completely Able Get In / Out Bed Completely Able Housework Completely Able Prepare Meals Completely  [place] for Self Completely Able Electronic Signature(s) Signed: 12/05/2016 4:11:32 PM By: Alric Quan Entered By: Alric Quan on 12/05/2016 10:34:16 Kimberly [name] ([id number]) -------------------------------------------------------------------------------- Education Assessment Details Patient Name: Kimberly [name] Date of Service: 12/05/2016 10:30 AM Medical Record Number: [id number] Patient Account Number: [id number] Date of Birth/Sex: [birth year] (71 y.o. Female) Treating RN: Ahmed Prima Primary Care Anikah Hogge: Cathlean Cower Other Clinician: Referring Maevis Mumby: Wilfred Lacy Treating Cariah Salatino/Extender: Frann Rider in Treatment: 0 Primary Learner Assessed: Patient Learning Preferences/Education Level/Primary Language Learning Preference: Explanation, Printed Material Highest Education Level: High School Preferred Language: English Cognitive Barrier Assessment/Beliefs Language Barrier: No Translator Needed: No Memory Deficit: No Emotional Barrier: No Cultural/Religious Beliefs Affecting Medical Care: No Physical Barrier Assessment Impaired Vision: Yes Glasses Impaired Hearing: No Decreased Hand dexterity: No Knowledge/Comprehension Assessment Knowledge Level: Medium Comprehension Level: Medium Ability to understand written Medium instructions: Ability to understand verbal Medium instructions: Motivation Assessment Anxiety Level: Calm Cooperation: Cooperative Education Importance: Acknowledges Need Interest in Health Problems: Asks Questions Perception: Coherent Willingness to Engage in Self- Medium Management Activities: Readiness to Engage in Self- Medium Management Activities: Electronic Signature(s) Signed: 12/05/2016 4:11:32 PM By: Alric Quan Entered By: Alric Quan on 12/05/2016 10:34:37 Kimberly [name]  ([id number]) -------------------------------------------------------------------------------- Fall Risk Assessment Details Patient Name: Kimberly [name] Date of Service: 12/05/2016 10:30 AM Medical Record Number: [id number] Patient Account Number: [id number] Date of Birth/Sex: 1945/05/27 (71 y.o. Female) Treating RN: Carolyne Fiscal, Debi Primary Care Marylyn Appenzeller: Cathlean Cower Other Clinician: Referring Santhiago Collingsworth: Wilfred Lacy Treating Annemarie Sebree/Extender: Frann Rider in Treatment: 0 Fall Risk Assessment Items Have you had 2 or more falls in the last 12 monthso 0 No Have you had any fall that resulted in injury in the last 12 monthso 0 No FALL RISK ASSESSMENT: History of falling - immediate or within 3 months 0 No Secondary diagnosis 0 No Ambulatory aid None/bed rest/wheelchair/nurse 0 No Crutches/cane/walker 0 No Furniture 0 No IV  Access/Saline Lock 0 No Gait/Training Normal/bed rest/immobile 0 No Weak 0 No Impaired 0 No Mental Status Oriented to own ability 0 Yes Electronic Signature(s) Signed: 12/05/2016 4:11:32 PM By: Alric Quan Entered By: Alric Quan on 12/05/2016 10:34:52 Kimberly [name] ([id number]) -------------------------------------------------------------------------------- Foot Assessment Details Patient Name: Kimberly [name] Date of Service: 12/05/2016 10:30 AM Medical Record Number: [id number] Patient Account Number: [id number] Date of Birth/Sex: 1945-05-22 (71 y.o. Female) Treating RN: Ahmed Prima Primary Care Preethi Scantlebury: Cathlean Cower Other Clinician: Referring Kati Riggenbach: Wilfred Lacy Treating Thaison Kolodziejski/Extender: Frann Rider in Treatment: 0 Foot Assessment Items Site Locations + = Sensation present, - = Sensation absent, C = Callus, U = Ulcer R = Redness, W = Warmth, M = Maceration, PU = Pre-ulcerative lesion F = Fissure, S = Swelling, D = Dryness Assessment Right: Left: Other Deformity: No No Prior Foot Ulcer: No No Prior Amputation:  No No Charcot Joint: No No Ambulatory Status: Ambulatory Without Help Gait: Steady Electronic Signature(s) Signed: 12/05/2016 4:11:32 PM By: Alric Quan Entered By: Alric Quan on 12/05/2016 10:41:46 Kimberly [name] ([id number]) -------------------------------------------------------------------------------- Nutrition Risk Assessment Details Patient Name: Kimberly [name] Date of Service: 12/05/2016 10:30 AM Medical Record Number: [id number] Patient Account Number: [id number] Date of Birth/Sex: [birth year] (71 y.o. Female) Treating RN: Carolyne Fiscal, Debi Primary Care Barnie Sopko: Cathlean Cower Other Clinician: Referring Haruto Demaria: Wilfred Lacy Treating Jayant Kriz/Extender: Frann Rider in Treatment: 0 Height (in): 61 Weight (lbs): 111.9 Body Mass Index (BMI): 21.1 Nutrition Risk Assessment Items NUTRITION RISK SCREEN: I have an illness or condition that made me change the kind and/or amount of 0 No food I eat I eat fewer than two meals per day 3 Yes I eat few fruits and vegetables, or milk products 0 No I have three or more drinks of beer, liquor or wine almost every day 0 No I have tooth or mouth problems that make it hard for me to eat 0 No I don't always have enough money to buy the food I need 0 No I eat alone most of the time 0 No I take three or more different prescribed or over-the-counter drugs a day 1 Yes Without wanting to, I have lost or gained 10 pounds in the last six months 0 No I am not always physically able to shop, cook and/or feed myself 0 No Nutrition Protocols Good Risk Protocol Moderate Risk Protocol Electronic Signature(s) Signed: 12/05/2016 4:11:32 PM By: Alric Quan Entered By: Alric Quan on 12/05/2016 10:39:32

## 2016-12-06 NOTE — Progress Notes (Addendum)
BAYLE, CALVO ([id number]) Visit Report for 12/05/2016 Chief Complaint Document Details Patient Name: Kimberly [name], Kimberly [name] Date of Service: 12/05/2016 10:30 AM Medical Record Number: [id number] Patient Account Number: [id number] Date of Birth/Sex: September 29, 1945 (71 y.o. Female) Treating RN: Ahmed Prima Primary Care Provider: Cathlean Cower Other Clinician: Referring Provider: Wilfred Lacy Treating Provider/Extender: Frann Rider in Treatment: 0 Information Obtained from: Patient Chief Complaint Patient seen for complaints of Non-Healing Wound to the right lateral calf which she's had for about 3 months Electronic Signature(s) Signed: 12/05/2016 11:26:33 AM By: Christin Fudge MD, FACS Entered By: Christin Fudge on 12/05/2016 11:26:33 Kimberly [name] ([id number]) -------------------------------------------------------------------------------- HPI Details Patient Name: Kimberly [name] Date of Service: 12/05/2016 10:30 AM Medical Record Number: [id number] Patient Account Number: [id number] Date of Birth/Sex: November 24, 1945 (71 y.o. Female) Treating RN: Ahmed Prima Primary Care Provider: Cathlean Cower Other Clinician: Referring Provider: Wilfred Lacy Treating Provider/Extender: Frann Rider in Treatment: 0 History of Present Illness Location: right lateral calf Quality: Patient reports experiencing a sharp pain to affected area(s). Severity: Patient states wound are getting worse. Duration: Patient has had the wound for > 3 months prior to seeking treatment at the wound center Timing: Pain in wound is constant (hurts all the time) Context: The wound occurred when the patient had a injury to her leg with a sharp object which caused a laceration Modifying Factors: Other treatment(s) tried include:of hydrogen peroxide, Neosporin and antibiotics Associated Signs and Symptoms: Patient reports having increase swelling. HPI Description: 71 year old patient was seen by the nurse  practitioner at the [facility] primary care for a wound which has been there on her right leg since August 2018. For a period of time she has been treated with doxycycline, clindamycin and Bactrim and the wound continues to have drainage and odor.on 11/20/2016 she was again put on Bactrim DS and referred to the wound center. Past medical history significant for hypertension, congestive heart failure, COPD, dyslipidemia, migraines, pericarditis, status post VSD repair, cholecystectomy tubal ligation, cesarean section, coronary angiogram,my tenotomy with tube placement on the right,dilated cardiomyopathy,tobacco abuse and has been a smoker for the last 35 years. She also has a history of right great saphenous vein ablation but the patient does not confirm this for sure. She has had a venous duplex study done in August 2018 which showed no evidence of lower extremity deep or superficial venous thrombus or incompetence bilaterally. She has a arterial duplex study pending, which is going to be done later today. Electronic Signature(s) Signed: 12/05/2016 11:29:06 AM By: Christin Fudge MD, FACS Previous Signature: 12/05/2016 10:11:56 AM Version By: Christin Fudge MD, FACS Entered By: Christin Fudge on 12/05/2016 11:29:06 Kimberly [name] ([id number]) -------------------------------------------------------------------------------- Physical Exam Details Patient Name: Kimberly [name] Date of Service: 12/05/2016 10:30 AM Medical Record Number: [id number] Patient Account Number: [id number] Date of Birth/Sex: 1945/09/15 (71 y.o. Female) Treating RN: Ahmed Prima Primary Care Provider: Cathlean Cower Other Clinician: Referring Provider: Wilfred Lacy Treating Provider/Extender: Frann Rider in Treatment: 0 Constitutional . Pulse regular. Respirations normal and unlabored. Afebrile. . Eyes Nonicteric. Reactive to light. Ears, Nose, Mouth, and Throat Lips, teeth, and gums WNL.Marland Kitchen Moist mucosa without  lesions. Neck supple and nontender. No palpable supraclavicular or cervical adenopathy. Normal sized without goiter. Respiratory WNL. No retractions.. Cardiovascular Pedal Pulses WNL. ABI could not be measured due to tenderness and she has a arterial duplex study scheduled for later today. No clubbing, cyanosis or edema. Chest Breasts symmetical and no nipple discharge.. Breast tissue WNL, no masses, lumps,  or tenderness.. Gastrointestinal (GI) Abdomen without masses or tenderness.. No liver or spleen enlargement or tenderness.. Lymphatic No adneopathy. No adenopathy. No adenopathy. Musculoskeletal Adexa without tenderness or enlargement.. Digits and nails w/o clubbing, cyanosis, infection, petechiae, ischemia, or inflammatory conditions.. Integumentary (Hair, Skin) No suspicious lesions. No crepitus or fluctuance. No peri-wound warmth or erythema. No masses.Marland Kitchen Psychiatric Judgement and insight Intact.. No evidence of depression, anxiety, or agitation.. Notes patient has 2 lacerated wounds on the right lateral calf which are covered with slough and the extremely tender to touch. No sharp debridement was attempted. Electronic Signature(s) Signed: 12/05/2016 11:30:07 AM By: Christin Fudge MD, FACS Entered By: Christin Fudge on 12/05/2016 11:30:07 Kimberly [name] ([id number]) -------------------------------------------------------------------------------- Physician Orders Details Patient Name: Kimberly [name] Date of Service: 12/05/2016 10:30 AM Medical Record Number: [id number] Patient Account Number: [id number] Date of Birth/Sex: 17-Apr-1945 (71 y.o. Female) Treating RN: Ahmed Prima Primary Care Provider: Cathlean Cower Other Clinician: Referring Provider: Wilfred Lacy Treating Provider/Extender: Frann Rider in Treatment: 0 Verbal / Phone Orders: Yes Clinician: Pinkerton, Debi Read Back and Verified: Yes Diagnosis Coding Wound Cleansing Wound #1 Right,Lateral Lower  Leg o Clean wound with Normal Saline. o Cleanse wound with mild soap and water o May Shower, gently pat wound dry prior to applying new dressing. Wound #2 Right,Posterior Lower Leg o Clean wound with Normal Saline. o Cleanse wound with mild soap and water o May Shower, gently pat wound dry prior to applying new dressing. Anesthetic Wound #1 Right,Lateral Lower Leg o Topical Lidocaine 4% cream applied to wound bed prior to debridement Wound #2 Right,Posterior Lower Leg o Topical Lidocaine 4% cream applied to wound bed prior to debridement Primary Wound Dressing Wound #1 Right,Lateral Lower Leg o Santyl Ointment Wound #2 Right,Posterior Lower Leg o Santyl Ointment Secondary Dressing Wound #1 Right,Lateral Lower Leg o ABD pad o Conform/Kerlix o Non-adherent pad o Other - netting #4 Wound #2 Right,Posterior Lower Leg o ABD pad o Conform/Kerlix o Non-adherent pad o Other - netting #4 Dressing Change Frequency Wound #1 Right,Lateral Lower Leg o Change dressing every day. Wound #2 Right,Posterior Lower Leg RAEVYN, SOKOL B. ([id number]) o Change dressing every day. Follow-up Appointments Wound #1 Right,Lateral Lower Leg o Return Appointment in 2 weeks. Wound #2 Right,Posterior Lower Leg o Return Appointment in 2 weeks. Edema Control Wound #1 Right,Lateral Lower Leg o Elevate legs to the level of the heart and pump ankles as often as possible Wound #2 Right,Posterior Lower Leg o Elevate legs to the level of the heart and pump ankles as often as possible Additional Orders / Instructions Wound #1 Right,Lateral Lower Leg o Stop Smoking o Increase protein intake. Wound #2 Right,Posterior Lower Leg o Stop Smoking o Increase protein intake. Medications-please add to medication list. Wound #1 Right,Lateral Lower Leg o Santyl Enzymatic Ointment o Other: - Vitamin C, Zinc, MVI Wound #2 Right,Posterior Lower Leg o  Santyl Enzymatic Ointment o Other: - Vitamin C, Zinc, MVI Patient Medications Allergies: NKDA Notifications Medication Indication Start End Santyl 12/05/2016 DOSE topical 250 unit/gram ointment - ointment topical as directed Electronic Signature(s) Signed: 12/05/2016 11:30:57 AM By: Christin Fudge MD, FACS Entered By: Christin Fudge on 12/05/2016 11:30:56 Kimberly [name] ([id number]) -------------------------------------------------------------------------------- Problem List Details Patient Name: Kimberly [name] Date of Service: 12/05/2016 10:30 AM Medical Record Number: [id number] Patient Account Number: [id number] Date of Birth/Sex: 02-12-1945 (71 y.o. Female) Treating RN: Ahmed Prima Primary Care Provider: Cathlean Cower Other Clinician: Referring Provider: Wilfred Lacy Treating Provider/Extender: Frann Rider in Treatment:  0 Active Problems ICD-10 Encounter Code Description Active Date Diagnosis L97.212 Non-pressure chronic ulcer of right calf with fat layer exposed 12/05/2016 Yes I89.0 Lymphedema, not elsewhere classified 12/05/2016 Yes I73.9 Peripheral vascular disease, unspecified 12/05/2016 Yes F17.218 Nicotine dependence, cigarettes, with other nicotine-induced 12/05/2016 Yes disorders Inactive Problems Resolved Problems Electronic Signature(s) Signed: 12/05/2016 11:26:04 AM By: Christin Fudge MD, FACS Entered By: Christin Fudge on 12/05/2016 11:26:04 Kimberly [name] ([id number]) -------------------------------------------------------------------------------- Progress Note Details Patient Name: Kimberly [name] Date of Service: 12/05/2016 10:30 AM Medical Record Number: [id number] Patient Account Number: [id number] Date of Birth/Sex: 03/18/1945 (71 y.o. Female) Treating RN: Ahmed Prima Primary Care Provider: Cathlean Cower Other Clinician: Referring Provider: Wilfred Lacy Treating Provider/Extender: Frann Rider in Treatment: 0 Subjective Chief  Complaint Information obtained from Patient Patient seen for complaints of Non-Healing Wound to the right lateral calf which she's had for about 3 months History of Present Illness (HPI) The following HPI elements were documented for the patient's wound: Location: right lateral calf Quality: Patient reports experiencing a sharp pain to affected area(s). Severity: Patient states wound are getting worse. Duration: Patient has had the wound for > 3 months prior to seeking treatment at the wound center Timing: Pain in wound is constant (hurts all the time) Context: The wound occurred when the patient had a injury to her leg with a sharp object which caused a laceration Modifying Factors: Other treatment(s) tried include:of hydrogen peroxide, Neosporin and antibiotics Associated Signs and Symptoms: Patient reports having increase swelling. 71 year old patient was seen by the nurse practitioner at the [facility] primary care for a wound which has been there on her right leg since August 2018. For a period of time she has been treated with doxycycline, clindamycin and Bactrim and the wound continues to have drainage and odor.on 11/20/2016 she was again put on Bactrim DS and referred to the wound center. Past medical history significant for hypertension, congestive heart failure, COPD, dyslipidemia, migraines, pericarditis, status post VSD repair, cholecystectomy tubal ligation, cesarean section, coronary angiogram,my tenotomy with tube placement on the right,dilated cardiomyopathy,tobacco abuse and has been a smoker for the last 35 years. She also has a history of right great saphenous vein ablation but the patient does not confirm this for sure. She has had a venous duplex study done in August 2018 which showed no evidence of lower extremity deep or superficial venous thrombus or incompetence bilaterally. She has a arterial duplex study pending, which is going to be done later today. Wound  History Patient presents with 1 open wound that has been present for approximately aug 2018. Patient has been treating wound in the following manner: hydrogel ag. Laboratory tests have not been performed in the last month. Patient reportedly has not tested positive for an antibiotic resistant organism. Patient reportedly has not tested positive for osteomyelitis. Patient reportedly has not had testing performed to evaluate circulation in the legs. Patient experiences the following problems associated with their wounds: swelling. Patient History Information obtained from Patient. Allergies NKDA Family History Cancer - Father, Heart Disease - Siblings, Hypertension - Mother, Tuberculosis - Father, No family history of Diabetes, Hereditary Spherocytosis, Kidney Disease, Lung Disease, Seizures, Stroke, Thyroid Problems. DOMINIQUE, RESSEL ([id number]) Social History Current every day smoker - 2-3 day, Marital Status - Married, Alcohol Use - Never, Drug Use - No History, Caffeine Use - Daily. Medical History Hematologic/Lymphatic Patient has history of Anemia - hx Respiratory Patient has history of Asthma, Chronic Obstructive Pulmonary Disease (COPD) Cardiovascular Patient has history of  Congestive Heart Failure Review of Systems (ROS) Constitutional Symptoms (General Health) The patient has no complaints or symptoms. Eyes Complains or has symptoms of Glasses / Contacts. Ear/Nose/Mouth/Throat hx tube in ear Cardiovascular VSD Gastrointestinal ulcer Endocrine The patient has no complaints or symptoms. Genitourinary The patient has no complaints or symptoms. Immunological The patient has no complaints or symptoms. Integumentary (Skin) The patient has no complaints or symptoms. Musculoskeletal DDD in neck Neurologic The patient has no complaints or symptoms. Oncologic The patient has no complaints or symptoms. Psychiatric Complains or has symptoms of  Anxiety. Medications carvedilol 3.125 mg tablet oral tablet oral Xanax 0.25 mg tablet oral 1 1 tablet oral as needed rosuvastatin 10 mg tablet oral tablet oral losartan 25 mg tablet oral tablet oral Lasix 40 mg tablet oral tablet oral Santyl 250 unit/gram topical ointment topical ointment topical as directed LEZLY, RUMPF B. ([id number]) Objective Constitutional Pulse regular. Respirations normal and unlabored. Afebrile. Vitals Time Taken: 10:26 AM, Height: 61 in, Source: Measured, Weight: 111.9 lbs, Source: Measured, BMI: 21.1, Temperature: 98.4 F, Pulse: 76 bpm, Respiratory Rate: 20 breaths/min, Blood Pressure: 108/93 mmHg. Eyes Nonicteric. Reactive to light. Ears, Nose, Mouth, and Throat Lips, teeth, and gums WNL.Marland Kitchen Moist mucosa without lesions. Neck supple and nontender. No palpable supraclavicular or cervical adenopathy. Normal sized without goiter. Respiratory WNL. No retractions.. Cardiovascular Pedal Pulses WNL. ABI could not be measured due to tenderness and she has a arterial duplex study scheduled for later today. No clubbing, cyanosis or edema. Chest Breasts symmetical and no nipple discharge.. Breast tissue WNL, no masses, lumps, or tenderness.. Gastrointestinal (GI) Abdomen without masses or tenderness.. No liver or spleen enlargement or tenderness.. Lymphatic No adneopathy. No adenopathy. No adenopathy. Musculoskeletal Adexa without tenderness or enlargement.. Digits and nails w/o clubbing, cyanosis, infection, petechiae, ischemia, or inflammatory conditions.Marland Kitchen Psychiatric Judgement and insight Intact.. No evidence of depression, anxiety, or agitation.. General Notes: patient has 2 lacerated wounds on the right lateral calf which are covered with slough and the extremely tender to touch. No sharp debridement was attempted. Integumentary (Hair, Skin) No suspicious lesions. No crepitus or fluctuance. No peri-wound warmth or erythema. No masses.. Wound #1 status is  Open. Original cause of wound was Trauma. The wound is located on the Right,Lateral Lower Leg. The wound measures 2cm length x 0.7cm width x 0.2cm depth; 1.1cm^2 area and 0.22cm^3 volume. There is no tunneling or undermining noted. There is a large amount of serous drainage noted. The wound margin is distinct with the outline attached to the wound base. There is no granulation within the wound bed. There is a large (67-100%) amount of necrotic tissue within the wound bed including Adherent Slough. The periwound skin appearance exhibited: Maceration. Periwound temperature was noted as No Abnormality. The periwound has tenderness on palpation. Wound #2 status is Open. Original cause of wound was Trauma. The wound is located on the Right,Posterior Lower Leg. The wound measures 1.6cm length x 1.2cm width x 0.1cm depth; 1.508cm^2 area and 0.151cm^3 volume. There is no tunneling or undermining noted. There is a large amount of serosanguineous drainage noted. The wound margin is distinct with the SHELSEY, RIETH B. ([id number]) outline attached to the wound base. There is no granulation within the wound bed. There is a large (67-100%) amount of necrotic tissue within the wound bed including Eschar and Adherent Slough. Periwound temperature was noted as No Abnormality. The periwound has tenderness on palpation. Assessment Active Problems ICD-10 L97.212 - Non-pressure chronic ulcer of right calf with fat layer exposed  I89.0 - Lymphedema, not elsewhere classified I73.9 - Peripheral vascular disease, unspecified F17.218 - Nicotine dependence, cigarettes, with other nicotine-induced disorders this 71 year old patient, who has had chronic cardiac issues and is a smoker, has a nonhealing wound on her right lateral calf which she has had for 3 months, and after a thorough review today I have recommended: 1. Santyl ointment locally to be applied every day after washing with soap and water and covering with a  bordered foam 2. Light compression stockings to be worn daily all day except for bedtime 3. Get her arterial duplex study -- scheduled for later today 4. I have spent 3 minutes discussing with her the need to completely give up smoking and have discussed the risks benefits alternatives and all the possible methodology 5. Regular visits to the wound center Plan Wound Cleansing: Wound #1 Right,Lateral Lower Leg: Clean wound with Normal Saline. Cleanse wound with mild soap and water May Shower, gently pat wound dry prior to applying new dressing. Wound #2 Right,Posterior Lower Leg: Clean wound with Normal Saline. Cleanse wound with mild soap and water May Shower, gently pat wound dry prior to applying new dressing. Anesthetic: Wound #1 Right,Lateral Lower Leg: Topical Lidocaine 4% cream applied to wound bed prior to debridement Wound #2 Right,Posterior Lower Leg: Topical Lidocaine 4% cream applied to wound bed prior to debridement Primary Wound Dressing: Wound #1 Right,Lateral Lower Leg: Santyl Ointment Wound #2 Right,Posterior Lower Leg: Santyl Ointment Secondary Dressing: Wound #1 Right,Lateral Lower Leg: ABD pad Conform/Kerlix Non-adherent pad KEANI, GOTCHER B. ([id number]) Other - netting #4 Wound #2 Right,Posterior Lower Leg: ABD pad Conform/Kerlix Non-adherent pad Other - netting #4 Dressing Change Frequency: Wound #1 Right,Lateral Lower Leg: Change dressing every day. Wound #2 Right,Posterior Lower Leg: Change dressing every day. Follow-up Appointments: Wound #1 Right,Lateral Lower Leg: Return Appointment in 2 weeks. Wound #2 Right,Posterior Lower Leg: Return Appointment in 2 weeks. Edema Control: Wound #1 Right,Lateral Lower Leg: Elevate legs to the level of the heart and pump ankles as often as possible Wound #2 Right,Posterior Lower Leg: Elevate legs to the level of the heart and pump ankles as often as possible Additional Orders / Instructions: Wound #1  Right,Lateral Lower Leg: Stop Smoking Increase protein intake. Wound #2 Right,Posterior Lower Leg: Stop Smoking Increase protein intake. Medications-please add to medication list.: Wound #1 Right,Lateral Lower Leg: Santyl Enzymatic Ointment Other: - Vitamin C, Zinc, MVI Wound #2 Right,Posterior Lower Leg: Santyl Enzymatic Ointment Other: - Vitamin C, Zinc, MVI The following medication(s) was prescribed: Santyl topical 250 unit/gram ointment ointment topical as directed starting 12/05/2016 this 71 year old patient, who has had chronic cardiac issues and is a smoker, has a nonhealing wound on her right lateral calf which she has had for 3 months, and after a thorough review today I have recommended: 1. Santyl ointment locally to be applied every day after washing with soap and water and covering with a bordered foam 2. Light compression stockings to be worn daily all day except for bedtime 3. Get her arterial duplex study -- scheduled for later today 4. I have spent 3 minutes discussing with her the need to completely give up smoking and have discussed the risks benefits alternatives and all the possible methodology 5. Regular visits to the wound center Electronic Signature(s) Signed: 12/05/2016 11:40:01 AM By: Christin Fudge MD, FACS Entered By: Christin Fudge on 12/05/2016 11:40:01 Kimberly [name] ([id number]) AZAYLEA, MAVES ([id number]) -------------------------------------------------------------------------------- ROS/PFSH Details Patient Name: Kimberly [name] Date of Service: 12/05/2016 10:30 AM  Medical Record Number: [id number] Patient Account Number: [id number] Date of Birth/Sex: [birth year] (71 y.o. Female) Treating RN: Carolyne Fiscal, Debi Primary Care Provider: Cathlean Cower Other Clinician: Referring Provider: Wilfred Lacy Treating Provider/Extender: Frann Rider in Treatment: 0 Information Obtained From Patient Wound History Do you currently have one or more open  woundso Yes How many open wounds do you currently haveo 1 Approximately how long have you had your woundso aug 2018 How have you been treating your wound(s) until nowo hydrogel ag Has your wound(s) ever healed and then re-openedo No Have you had any lab work done in the past montho No Have you tested positive for an antibiotic resistant organism (MRSA, VRE)o No Have you tested positive for osteomyelitis (bone infection)o No Have you had any tests for circulation on your legso No Have you had other problems associated with your woundso Swelling Eyes Complaints and Symptoms: Positive for: Glasses / Contacts Psychiatric Complaints and Symptoms: Positive for: Anxiety Constitutional Symptoms (General Health) Complaints and Symptoms: No Complaints or Symptoms Ear/Nose/Mouth/Throat Complaints and Symptoms: Review of System Notes: hx tube in ear Hematologic/Lymphatic Medical History: Positive for: Anemia - hx Respiratory Medical History: Positive for: Asthma; Chronic Obstructive Pulmonary Disease (COPD) Cardiovascular Complaints and Symptoms: Review of System Notes: VSD DELISA, FINCK ([id number]) Medical History: Positive for: Congestive Heart Failure Gastrointestinal Complaints and Symptoms: Review of System Notes: ulcer Endocrine Complaints and Symptoms: No Complaints or Symptoms Genitourinary Complaints and Symptoms: No Complaints or Symptoms Immunological Complaints and Symptoms: No Complaints or Symptoms Integumentary (Skin) Complaints and Symptoms: No Complaints or Symptoms Musculoskeletal Complaints and Symptoms: Review of System Notes: DDD in neck Neurologic Complaints and Symptoms: No Complaints or Symptoms Oncologic Complaints and Symptoms: No Complaints or Symptoms Immunizations Pneumococcal Vaccine: Received Pneumococcal Vaccination: Yes Implantable Devices Family and Social History Cancer: Yes - Father; Diabetes: No; Heart Disease: Yes -  Siblings; Hereditary Spherocytosis: No; Hypertension: Yes - Mother; Kidney Disease: No; Lung Disease: No; Seizures: No; Stroke: No; Thyroid Problems: No; Tuberculosis: Yes - Father; Current every day smoker - 2-3 day; Marital Status - Married; Alcohol Use: Never; Drug Use: No History; Caffeine Use: Daily; Financial Concerns: No; Food, Clothing or Shelter Needs: No; Support System Lacking: No; Transportation Concerns: No; Advanced Directives: No; Patient does not want information on Advanced Directives; Do not resuscitate: No; Living Will: No; Medical Power of Attorney: No KANCHAN, GAL ([id number]) Physician Affirmation I have reviewed and agree with the above information. Electronic Signature(s) Signed: 12/05/2016 4:11:32 PM By: Alric Quan Signed: 12/05/2016 4:27:13 PM By: Christin Fudge MD, FACS Entered By: Christin Fudge on 12/05/2016 11:24:44 Kimberly [name] ([id number]) -------------------------------------------------------------------------------- SuperBill Details Patient Name: Kimberly [name] Date of Service: 12/05/2016 Medical Record Number: [id number] Patient Account Number: [id number] Date of Birth/Sex: [birth year] (71 y.o. Female) Treating RN: Ahmed Prima Primary Care Provider: Cathlean Cower Other Clinician: Referring Provider: Wilfred Lacy Treating Provider/Extender: Frann Rider in Treatment: 0 Diagnosis Coding ICD-10 Codes Code Description [phone] Non-pressure chronic ulcer of right calf with fat layer exposed I89.0 Lymphedema, not elsewhere classified I73.9 Peripheral vascular disease, unspecified F17.218 Nicotine dependence, cigarettes, with other nicotine-induced disorders Facility Procedures CPT4 Code: 73710626 Description: 99214 - WOUND CARE VISIT-LEV 4 EST PT Modifier: Quantity: 1 Physician Procedures CPT4 Code: 9485462 Description: 70350 - WC PHYS LEVEL 4 - NEW PT ICD-10 Diagnosis Description L97.212 Non-pressure chronic ulcer of right calf  with fat layer ex I89.0 Lymphedema, not elsewhere classified I73.9 Peripheral vascular disease, unspecified F17.218 Nicotine  dependence, cigarettes, with other nicotine-induc Modifier: posed  ed disorders Quantity: 1 Electronic Signature(s) Signed: 12/05/2016 12:33:33 PM By: Alric Quan Signed: 12/05/2016 4:27:13 PM By: Christin Fudge MD, FACS Previous Signature: 12/05/2016 11:40:30 AM Version By: Christin Fudge MD, FACS Entered By: Alric Quan on 12/05/2016 12:33:33

## 2016-12-08 ENCOUNTER — Encounter: Payer: Self-pay | Admitting: Cardiology

## 2016-12-08 ENCOUNTER — Ambulatory Visit: Payer: Medicare Other | Admitting: Cardiovascular Disease

## 2016-12-08 VITALS — BP 90/60 | HR 62 | Ht 61.0 in | Wt 112.0 lb

## 2016-12-08 DIAGNOSIS — I739 Peripheral vascular disease, unspecified: Secondary | ICD-10-CM | POA: Diagnosis not present

## 2016-12-08 DIAGNOSIS — Z01812 Encounter for preprocedural laboratory examination: Secondary | ICD-10-CM | POA: Diagnosis not present

## 2016-12-08 DIAGNOSIS — I5022 Chronic systolic (congestive) heart failure: Secondary | ICD-10-CM

## 2016-12-08 DIAGNOSIS — Z720 Tobacco use: Secondary | ICD-10-CM | POA: Diagnosis not present

## 2016-12-08 DIAGNOSIS — E785 Hyperlipidemia, unspecified: Secondary | ICD-10-CM | POA: Diagnosis not present

## 2016-12-08 LAB — VAS US LOWER EXTREMITY ARTERIAL DUPLEX
LPOPPPSV: 43 cm/s
LSFDPSV: -48 cm/s
LSFMPSV: -51 cm/s
Left ant tibial mid sys: 38 cm/s
Left popliteal dist sys PSV: -54 cm/s
Left super femoral prox sys PSV: -85 cm/s
RATIBMIDSYS: -30 cm/s
RPEROMIDSYS: -21 cm/s
RPOPDPSV: -60 cm/s
RSFMPSV: -57 cm/s
RTIBMIDSYS: 50 cm/s
Right popliteal prox sys PSV: 41 cm/s
Right super femoral dist sys PSV: -69 cm/s
Right super femoral prox sys PSV: -51 cm/s
left post tibial mid sys: 44 cm/s

## 2016-12-08 MED ORDER — HYDROCOD POLST-CPM POLST ER 10-8 MG PO CP12: 5.0000 mL | ORAL_CAPSULE | Freq: Two times a day (BID) | ORAL | 0 refills | Status: DC | PRN

## 2016-12-08 NOTE — Progress Notes (Signed)
Cardiology Office Note   Date:  12/10/2016   ID:  Kimberly, [name] 1945/09/25, MRN [id number]  PCP:  Biagio Borg, MD  Cardiologist: Dr. Harrington Challenger  Chief Complaint  Patient presents with  . Other    PVD. Pt requesting Rx for Hydrocodone for cough. Meds reviewed verbally with pt.      History of Present Illness: Kimberly [name] is a 71 y.o. female who was referred by Richardson Dopp for evaluation and management of peripheral arterial disease.  The patient has extensive medical problems.  She has history of congenital heart disease status post VSD repair with multiple prior catheterizations via bilateral femoral arteries when she was a child, SVT status post ablation, hyperlipidemia, COPD, tobacco use, transient atrial fibrillation and chronic systolic heart failure. I performed recent right and left cardiac catheterization to evaluate her systolic dysfunction.  It showed minimal nonobstructive coronary artery disease, mild pulmonary hypertension and severely reduced cardiac output. Catheterization could not be performed via the radial arteries as the pulse was noted to be very diminished.  I was able to perform the procedure via the right common femoral artery in spite of her pulse being weak there.  Angiography at the end showed occluded right common femoral artery with collaterals. The patient has a wound on her right lateral leg above the ankle which started in August after trauma and has not healed since then in spite of wound care.  She does have right leg claudication. She underwent recent noninvasive vascular evaluation which showed a right ABI of 0.62 and left of 0.72.    Past Medical History:  Diagnosis Date  . Anxiety   . Atrial tachycardia ([place])   . Bursitis of shoulder, right, adhesive   . CHF (congestive heart failure) ([place])   . Chronic bronchitis ([place])    "1-2 times/yr" (01/23/2014)  . COPD (chronic obstructive pulmonary disease) ([place])   . CVD (cerebrovascular disease)   .  Dyslipidemia   . Dysrhythmia   . Frequency of urination   . GERD (gastroesophageal reflux disease)   . Heart murmur   . History of stomach ulcers   . HTN (hypertension) 02/22/2011  . Migraines    "stopped many years ago" (06/14/2014)  . Osteoporosis 08/19/2016  . Pericarditis   . Pneumonia "10 times" (06/14/2014)  . 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 Silent myocardial infarction ([place]) "late 1990's"  . Stress incontinence    "was suppose to have been tacked up years ago but I didn't do it"  . Syncope, near    Associated with atrial tachycardia-event recorder 1/16  . Thoracic outlet syndrome   . VSD (ventricular septal defect)     Past Surgical History:  Procedure Laterality Date  . CARDIAC CATHETERIZATION  "quite a few"  . [place]  . CHOLECYSTECTOMY OPEN  1970's  . CORONARY ANGIOGRAM  2000   No significant CAD  . ELECTROPHYSIOLOGIC STUDY N/A 06/14/2014   Procedure: A-Flutter/A-Tach/SVT Ablation;  Surgeon: Evans Lance, MD;  Location: [facility];  Service: Cardiovascular;  Laterality: N/A;  . MYRINGOTOMY WITH TUBE PLACEMENT Right 2015  . RIGHT/LEFT HEART CATH AND CORONARY ANGIOGRAPHY Bilateral 11/06/2016   Procedure: RIGHT/LEFT HEART CATH AND CORONARY ANGIOGRAPHY;  Surgeon: Wellington Hampshire, MD;  Location: [facility];  Service: Cardiovascular;  Laterality: Bilateral;  . SVT ABLATION  06/14/2014  . TUBAL LIGATION  1972  . VSD REPAIR  1958; [phone]  Current Outpatient Medications  Medication Sig Dispense Refill  . albuterol (VENTOLIN HFA) 108 (90 Base) MCG/ACT inhaler Inhale 2 puffs into the lungs every 6 (six) hours as needed for wheezing or shortness of breath. 18 g 11  . ALPRAZolam (XANAX) 0.25 MG tablet Take 1 tablet (0.25 mg total) by mouth 2 (two) times daily as needed for anxiety. 60 tablet 2  . aspirin 81 MG tablet Take 1 tablet (81 mg total) by mouth daily. 100 tablet 99  . carvedilol (COREG)  3.125 MG tablet Take 1 tablet (3.125 mg total) by mouth 2 (two) times daily. 60 tablet 3  . celecoxib (CELEBREX) 100 MG capsule Take 100 mg by mouth 2 (two) times daily as needed for moderate pain.    . furosemide (LASIX) 40 MG tablet Take 1 tablet (40 mg total) daily by mouth. 90 tablet 3  . Hydrocod Polst-Chlorphen Polst 10-8 MG CP12 Take 5 mLs every 12 (twelve) hours as needed by mouth (for cough). 90 each 0  . losartan (COZAAR) 25 MG tablet Take 1 tablet (25 mg total) by mouth daily. (Patient taking differently: Take 25 mg by mouth at bedtime. ) 90 tablet 3  . potassium chloride (K-DUR) 10 MEQ tablet Take 1 tablet (10 mEq total) daily by mouth. (Patient taking differently: Take 10 mEq by mouth 2 (two) times daily. ) 90 tablet 3  . rosuvastatin (CRESTOR) 10 MG tablet TAKE 1 TABLET BY MOUTH EVERY DAY. 90 tablet 3  . SILVER HYDROGEL GEL Apply 1 inch topically 2 (two) times daily. (Patient not taking: Reported on 12/09/2016) 1 Tube 0  . collagenase (SANTYL) ointment Apply 1 application topically daily.    Marland Kitchen ibuprofen (ADVIL,MOTRIN) 200 MG tablet Take 200 mg by mouth daily as needed for headache.     No current facility-administered medications for this visit.     Allergies:   Mupirocin and Codeine    Social History:  The patient  reports that she has been smoking cigarettes.  She has a 11.55 pack-year smoking history. she has never used smokeless tobacco. She reports that she drinks alcohol. She reports that she does not use drugs.   Family History:  The patient's family history includes Alcohol abuse in her other; Arthritis in her other and other; Cancer in her father, mother, and other; Hypertension in her other and unknown relative; Stroke in her other and unknown relative; Throat cancer in her unknown relative.    ROS:  Please see the history of present illness.   Otherwise, review of systems are positive for none.   All other systems are reviewed and negative.    PHYSICAL EXAM: VS:  BP  90/60 (BP Location: Left Arm, Patient Position: Sitting, Cuff Size: Normal)   Pulse 62   Ht 5\' 1"  (1.549 m)   Wt 112 lb (50.8 kg)   BMI 21.16 kg/m  , BMI Body mass index is 21.16 kg/m. GEN: Well nourished, well developed, in no acute distress  HEENT: normal  Neck: no JVD, carotid bruits, or masses Cardiac: RRR; no  rubs, or gallops,no edema.  3 out of 6 holosystolic murmur at the left sternal border and apex. Respiratory:  clear to auscultation bilaterally, normal work of breathing GI: soft, nontender, nondistended, + BS MS: no deformity or atrophy  Skin: warm and dry, no rash Neuro:  Strength and sensation are intact Psych: euthymic mood, full affect Vascular: Radial pulse is absent bilaterally.  Femoral pulse: +1 on the right side and +2 on the left  side.  Distal pulses are not palpable.  EKG:  EKG is ordered today. The ekg ordered today demonstrates normal sinus rhythm with nonspecific IVCD.   Recent Labs: 07/25/2016: TSH 0.99 08/26/2016: ALT 10 10/01/2016: NT-Pro BNP 3,221 11/04/2016: Hemoglobin 15.3; Platelets 235 12/02/2016: BUN 10; Creatinine, Ser 1.03; Potassium 4.4; Sodium 136    Lipid Panel    Component Value Date/Time   CHOL 150 07/25/2016 1129   TRIG 55.0 07/25/2016 1129   HDL 78.50 07/25/2016 1129   CHOLHDL 2 07/25/2016 1129   VLDL 11.0 07/25/2016 1129   LDLCALC 61 07/25/2016 1129   LDLDIRECT 112.6 03/16/2013 1057      Wt Readings from Last 3 Encounters:  12/08/16 112 lb (50.8 kg)  11/20/16 114 lb (51.7 kg)  11/19/16 113 lb 12.8 oz (51.6 kg)       No flowsheet data found.    ASSESSMENT AND PLAN:  1.  Peripheral arterial disease with critical limb ischemia due to nonhealing wound on the right lower extremity.  The patient has evidence of short occlusion of the right common femoral artery.  Given her cardiac status and COPD, I do not think she is a good surgical candidate for endarterectomy.  I recommend proceeding with abdominal aortogram with lower  extremity runoff and possible endovascular intervention.  I discussed the procedure in details as well as risks and benefits.  Planned axis via the left common femoral artery.  2.  Chronic systolic heart failure: Due to nonischemic cardiomyopathy.  Continue treatment with carvedilol, losartan and furosemide.  3.  Hyperlipidemia: Continue treatment with rosuvastatin with a target LDL of less than 70.  4.  Tobacco use: I had a prolonged discussion with her about the importance of smoking cessation.    Disposition:   FU with me in 1 month  Signed,  Kathlyn Sacramento, MD  12/10/2016 7:28 PM    [place]

## 2016-12-08 NOTE — Patient Instructions (Signed)
Medication Instructions:  Your physician recommends that you continue on your current medications as directed. Please refer to the Current Medication list given to you today.   Labwork: BMET, CBC, PT/INR at the [facility] lab. No appt needed. Please have labs by November 26  Testing/Procedures: Abdominal aortogram at [facility] [address] [place] [place] [facility] [place] [phone]  Wednesday, November 28 Arrival time 6:30am Nothing to eat or drink after midnight the evening before your procedure. Please plan to stay one night. Please do not take lasix the morning of your procedure.    Follow-Up: Your physician recommends that you schedule a follow-up appointment in: one month with Dr. Fletcher Anon.    Any Other Special Instructions Will Be Listed Below (If Applicable).     If you need a refill on your cardiac medications before your next appointment, please call your pharmacy.

## 2016-12-08 NOTE — H&P (View-Only) (Signed)
Cardiology Office Note   Date:  12/10/2016   ID:  Kimberly [name], Kimberly [name] [birth year], MRN [id number]  PCP:  Biagio Borg, MD  Cardiologist: Dr. Harrington Challenger  Chief Complaint  Patient presents with  . Other    PVD. Pt requesting Rx for Hydrocodone for cough. Meds reviewed verbally with pt.      History of Present Illness: Kimberly [name] is a 71 y.o. female who was referred by Kimberly [name] for evaluation and management of peripheral arterial disease.  The patient has extensive medical problems.  She has history of congenital heart disease status post VSD repair with multiple prior catheterizations via bilateral femoral arteries when she was a child, SVT status post ablation, hyperlipidemia, COPD, tobacco use, transient atrial fibrillation and chronic systolic heart failure. I performed recent right and left cardiac catheterization to evaluate her systolic dysfunction.  It showed minimal nonobstructive coronary artery disease, mild pulmonary hypertension and severely reduced cardiac output. Catheterization could not be performed via the radial arteries as the pulse was noted to be very diminished.  I was able to perform the procedure via the right common femoral artery in spite of her pulse being weak there.  Angiography at the end showed occluded right common femoral artery with collaterals. The patient has a wound on her right lateral leg above the ankle which started in August after trauma and has not healed since then in spite of wound care.  She does have right leg claudication. She underwent recent noninvasive vascular evaluation which showed a right ABI of 0.62 and left of 0.72.    Past Medical History:  Diagnosis Date  . Anxiety   . Atrial tachycardia ([place])   . Bursitis of shoulder, right, adhesive   . CHF (congestive heart failure) ([place])   . Chronic bronchitis ([name])    "1-2 times/yr" (01/23/2014)  . COPD (chronic obstructive pulmonary disease) ([place])   . CVD (cerebrovascular disease)   .  Dyslipidemia   . Dysrhythmia   . Frequency of urination   . GERD (gastroesophageal reflux disease)   . Heart murmur   . History of stomach ulcers   . HTN (hypertension) 02/22/2011  . Migraines    "stopped many years ago" (06/14/2014)  . Osteoporosis 08/19/2016  . Pericarditis   . Pneumonia "10 times" (06/14/2014)  . 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 Silent myocardial infarction ([place]) "late 1990's"  . Stress incontinence    "was suppose to have been tacked up years ago but I didn't do it"  . Syncope, near    Associated with atrial tachycardia-event recorder 1/16  . Thoracic outlet syndrome   . VSD (ventricular septal defect)     Past Surgical History:  Procedure Laterality Date  . CARDIAC CATHETERIZATION  "quite a few"  . [place]  . CHOLECYSTECTOMY OPEN  1970's  . CORONARY ANGIOGRAM  2000   No significant CAD  . ELECTROPHYSIOLOGIC STUDY N/A 06/14/2014   Procedure: A-Flutter/A-Tach/SVT Ablation;  Surgeon: Evans Lance, MD;  Location: [facility];  Service: Cardiovascular;  Laterality: N/A;  . MYRINGOTOMY WITH TUBE PLACEMENT Right 2015  . RIGHT/LEFT HEART CATH AND CORONARY ANGIOGRAPHY Bilateral 11/06/2016   Procedure: RIGHT/LEFT HEART CATH AND CORONARY ANGIOGRAPHY;  Surgeon: Wellington Hampshire, MD;  Location: [facility];  Service: Cardiovascular;  Laterality: Bilateral;  . SVT ABLATION  06/14/2014  . TUBAL LIGATION  1972  . VSD REPAIR  1958; [phone]  Current Outpatient Medications  Medication Sig Dispense Refill  . albuterol (VENTOLIN HFA) 108 (90 Base) MCG/ACT inhaler Inhale 2 puffs into the lungs every 6 (six) hours as needed for wheezing or shortness of breath. 18 g 11  . ALPRAZolam (XANAX) 0.25 MG tablet Take 1 tablet (0.25 mg total) by mouth 2 (two) times daily as needed for anxiety. 60 tablet 2  . aspirin 81 MG tablet Take 1 tablet (81 mg total) by mouth daily. 100 tablet 99  . carvedilol (COREG)  3.125 MG tablet Take 1 tablet (3.125 mg total) by mouth 2 (two) times daily. 60 tablet 3  . celecoxib (CELEBREX) 100 MG capsule Take 100 mg by mouth 2 (two) times daily as needed for moderate pain.    . furosemide (LASIX) 40 MG tablet Take 1 tablet (40 mg total) daily by mouth. 90 tablet 3  . Hydrocod Polst-Chlorphen Polst 10-8 MG CP12 Take 5 mLs every 12 (twelve) hours as needed by mouth (for cough). 90 each 0  . losartan (COZAAR) 25 MG tablet Take 1 tablet (25 mg total) by mouth daily. (Patient taking differently: Take 25 mg by mouth at bedtime. ) 90 tablet 3  . potassium chloride (K-DUR) 10 MEQ tablet Take 1 tablet (10 mEq total) daily by mouth. (Patient taking differently: Take 10 mEq by mouth 2 (two) times daily. ) 90 tablet 3  . rosuvastatin (CRESTOR) 10 MG tablet TAKE 1 TABLET BY MOUTH EVERY DAY. 90 tablet 3  . SILVER HYDROGEL GEL Apply 1 inch topically 2 (two) times daily. (Patient not taking: Reported on 12/09/2016) 1 Tube 0  . collagenase (SANTYL) ointment Apply 1 application topically daily.    Marland Kitchen ibuprofen (ADVIL,MOTRIN) 200 MG tablet Take 200 mg by mouth daily as needed for headache.     No current facility-administered medications for this visit.     Allergies:   Mupirocin and Codeine    Social History:  The patient  reports that she has been smoking cigarettes.  She has a 11.55 pack-year smoking history. she has never used smokeless tobacco. She reports that she drinks alcohol. She reports that she does not use drugs.   Family History:  The patient's family history includes Alcohol abuse in her other; Arthritis in her other and other; Cancer in her father, mother, and other; Hypertension in her other and unknown relative; Stroke in her other and unknown relative; Throat cancer in her unknown relative.    ROS:  Please see the history of present illness.   Otherwise, review of systems are positive for none.   All other systems are reviewed and negative.    PHYSICAL EXAM: VS:  BP  90/60 (BP Location: Left Arm, Patient Position: Sitting, Cuff Size: Normal)   Pulse 62   Ht 5\' 1"  (1.549 m)   Wt 112 lb (50.8 kg)   BMI 21.16 kg/m  , BMI Body mass index is 21.16 kg/m. GEN: Well nourished, well developed, in no acute distress  HEENT: normal  Neck: no JVD, carotid bruits, or masses Cardiac: RRR; no  rubs, or gallops,no edema.  3 out of 6 holosystolic murmur at the left sternal border and apex. Respiratory:  clear to auscultation bilaterally, normal work of breathing GI: soft, nontender, nondistended, + BS MS: no deformity or atrophy  Skin: warm and dry, no rash Neuro:  Strength and sensation are intact Psych: euthymic mood, full affect Vascular: Radial pulse is absent bilaterally.  Femoral pulse: +1 on the right side and +2 on the left  side.  Distal pulses are not palpable.  EKG:  EKG is ordered today. The ekg ordered today demonstrates normal sinus rhythm with nonspecific IVCD.   Recent Labs: 07/25/2016: TSH 0.99 08/26/2016: ALT 10 10/01/2016: NT-Pro BNP 3,221 11/04/2016: Hemoglobin 15.3; Platelets 235 12/02/2016: BUN 10; Creatinine, Ser 1.03; Potassium 4.4; Sodium 136    Lipid Panel    Component Value Date/Time   CHOL 150 07/25/2016 1129   TRIG 55.0 07/25/2016 1129   HDL 78.50 07/25/2016 1129   CHOLHDL 2 07/25/2016 1129   VLDL 11.0 07/25/2016 1129   LDLCALC 61 07/25/2016 1129   LDLDIRECT 112.6 03/16/2013 1057      Wt Readings from Last 3 Encounters:  12/08/16 112 lb (50.8 kg)  11/20/16 114 lb (51.7 kg)  11/19/16 113 lb 12.8 oz (51.6 kg)       No flowsheet data found.    ASSESSMENT AND PLAN:  1.  Peripheral arterial disease with critical limb ischemia due to nonhealing wound on the right lower extremity.  The patient has evidence of short occlusion of the right common femoral artery.  Given her cardiac status and COPD, I do not think she is a good surgical candidate for endarterectomy.  I recommend proceeding with abdominal aortogram with lower  extremity runoff and possible endovascular intervention.  I discussed the procedure in details as well as risks and benefits.  Planned axis via the left common femoral artery.  2.  Chronic systolic heart failure: Due to nonischemic cardiomyopathy.  Continue treatment with carvedilol, losartan and furosemide.  3.  Hyperlipidemia: Continue treatment with rosuvastatin with a target LDL of less than 70.  4.  Tobacco use: I had a prolonged discussion with her about the importance of smoking cessation.    Disposition:   FU with me in 1 month  Signed,  Kathlyn Sacramento, MD  12/10/2016 7:28 PM    [place]

## 2016-12-09 NOTE — Progress Notes (Signed)
Kimberly [name], Kimberly [name] ([id number]) Visit Report for 12/05/2016 Allergy List Details Patient Name: Kimberly [name], Kimberly [name] Date of Service: 12/05/2016 10:30 AM Medical Record Number: [id number] Patient Account Number: [id number] Date of Birth/Sex: [birth year] (71 y.o. Female) Treating RN: Ahmed Prima Primary Care Kyrie Bun: Cathlean Cower Other Clinician: Referring Taelyr Jantz: Wilfred Lacy Treating Paulene Tayag/Extender: Frann Rider in Treatment: 0 Allergies Active Allergies NKDA Allergy Notes Electronic Signature(s) Signed: 12/05/2016 4:11:32 PM By: Alric Quan Entered By: Alric Quan on 12/05/2016 10:56:31 Kimberly [name] ([id number]) -------------------------------------------------------------------------------- Arrival Information Details Patient Name: Kimberly [name] Date of Service: 12/05/2016 10:30 AM Medical Record Number: [id number] Patient Account Number: [id number] Date of Birth/Sex: 1945/10/14 (71 y.o. Female) Treating RN: Ahmed Prima Primary Care Sarah-Jane Nazario: Cathlean Cower Other Clinician: Referring Keshon Markovitz: Wilfred Lacy Treating Collier Bohnet/Extender: Frann Rider in Treatment: 0 Visit Information Patient Arrived: Ambulatory Arrival Time: 10:19 Accompanied By: self Transfer Assistance: None Patient Identification Verified: Yes Secondary Verification Process Completed: Yes Patient Requires Transmission-Based No Precautions: Patient Has Alerts: No Electronic Signature(s) Signed: 12/05/2016 4:11:32 PM By: Alric Quan Entered By: Alric Quan on 12/05/2016 10:24:13 Kimberly [name] ([id number]) -------------------------------------------------------------------------------- Clinic Level of Care Assessment Details Patient Name: Kimberly [name] Date of Service: 12/05/2016 10:30 AM Medical Record Number: [id number] Patient Account Number: [id number] Date of Birth/Sex: January 09, 1946 (71 y.o. Female) Treating RN: Ahmed Prima Primary Care Atziri Zubiate: Cathlean Cower Other Clinician: Referring Kikuye Korenek: Wilfred Lacy Treating Abriana Saltos/Extender: Frann Rider in Treatment: 0 Clinic Level of Care Assessment Items TOOL 2 Quantity Score X - Use when only an EandM is performed on the INITIAL visit 1 0 ASSESSMENTS - Nursing Assessment / Reassessment X - General Physical Exam (combine w/ comprehensive assessment (listed just below) when 1 20 performed on new pt. evals) X- 1 25 Comprehensive Assessment (HX, ROS, Risk Assessments, Wounds Hx, etc.) ASSESSMENTS - Wound and Skin Assessment / Reassessment []  - Simple Wound Assessment / Reassessment - one wound 0 X- 2 5 Complex Wound Assessment / Reassessment - multiple wounds []  - 0 Dermatologic / Skin Assessment (not related to wound area) ASSESSMENTS - Ostomy and/or Continence Assessment and Care []  - Incontinence Assessment and Management 0 []  - 0 Ostomy Care Assessment and Management (repouching, etc.) PROCESS - Coordination of Care X - Simple Patient / Family Education for ongoing care 1 15 []  - 0 Complex (extensive) Patient / Family Education for ongoing care []  - 0 Staff obtains Programmer, systems, Records, Test Results / Process Orders []  - 0 Staff telephones HHA, Nursing Homes / Clarify orders / etc []  - 0 Routine Transfer to another Facility (non-emergent condition) []  - 0 Routine Hospital Admission (non-emergent condition) X- 1 15 New Admissions / Biomedical engineer / Ordering NPWT, Apligraf, etc. []  - 0 Emergency Hospital Admission (emergent condition) X- 1 10 Simple Discharge Coordination []  - 0 Complex (extensive) Discharge Coordination PROCESS - Special Needs []  - Pediatric / Minor Patient Management 0 []  - 0 Isolation Patient Management Kimberly [name], Kimberly [name] ([id number]) []  - 0 Hearing / Language / Visual special needs []  - 0 Assessment of Community assistance (transportation, D/C planning, etc.) []  - 0 Additional assistance / Altered mentation []  - 0 Support  Surface(s) Assessment (bed, cushion, seat, etc.) INTERVENTIONS - Wound Cleansing / Measurement X - Wound Imaging (photographs - any number of wounds) 1 5 []  - 0 Wound Tracing (instead of photographs) []  - 0 Simple Wound Measurement - one wound X- 2 5 Complex Wound Measurement - multiple wounds []  - 0 Simple Wound Cleansing - one wound X-  2 5 Complex Wound Cleansing - multiple wounds INTERVENTIONS - Wound Dressings X - Small Wound Dressing one or multiple wounds 1 10 []  - 0 Medium Wound Dressing one or multiple wounds []  - 0 Large Wound Dressing one or multiple wounds []  - 0 Application of Medications - injection INTERVENTIONS - Miscellaneous []  - External ear exam 0 []  - 0 Specimen Collection (cultures, biopsies, blood, body fluids, etc.) []  - 0 Specimen(s) / Culture(s) sent or taken to Lab for analysis []  - 0 Patient Transfer (multiple staff / [name] Lift / Similar devices) []  - 0 Simple Staple / Suture removal (25 or less) []  - 0 Complex Staple / Suture removal (26 or more) []  - 0 Hypo / Hyperglycemic Management (close monitor of Blood Glucose) []  - 0 Ankle / Brachial Index (ABI) - do not check if billed separately Has the patient been seen at the hospital within the last three years: Yes Total Score: 130 Level Of Care: New/Established - Level 4 Electronic Signature(s) Signed: 12/05/2016 4:11:32 PM By: Alric Quan Entered By: Alric Quan on 12/05/2016 12:33:16 Kimberly [name] ([id number]) -------------------------------------------------------------------------------- Encounter Discharge Information Details Patient Name: Kimberly [name] Date of Service: 12/05/2016 10:30 AM Medical Record Number: [id number] Patient Account Number: [id number] Date of Birth/Sex: 08/06/1945 (71 y.o. Female) Treating RN: Ahmed Prima Primary Care Darchelle Nunes: Cathlean Cower Other Clinician: Referring Clodagh Odenthal: Wilfred Lacy Treating Brinlyn Cena/Extender: Frann Rider in  Treatment: 0 Encounter Discharge Information Items Discharge Pain Level: 0 Discharge Condition: Stable Ambulatory Status: Ambulatory Discharge Destination: Home Transportation: Private Auto Accompanied ByOlam Idler Schedule Follow-up Appointment: Yes Medication Reconciliation completed and No provided to Patient/Care Edom Schmuhl: Provided on Clinical Summary of Care: 12/05/2016 Form Type Recipient Paper Patient MW Electronic Signature(s) Signed: 12/09/2016 8:35:27 AM By: Ruthine Dose Entered By: Ruthine Dose on 12/05/2016 11:30:46 Kimberly [name] ([id number]) -------------------------------------------------------------------------------- Lower Extremity Assessment Details Patient Name: Kimberly [name] Date of Service: 12/05/2016 10:30 AM Medical Record Number: [id number] Patient Account Number: [id number] Date of Birth/Sex: Feb 09, 1945 (71 y.o. Female) Treating RN: Ahmed Prima Primary Care Taryll Reichenberger: Cathlean Cower Other Clinician: Referring Vu Liebman: Wilfred Lacy Treating Malasia Torain/Extender: Frann Rider in Treatment: 0 Vascular Assessment Pulses: Dorsalis Pedis Palpable: [Right:Yes] Posterior Tibial Palpable: [Right:Yes] Extremity colors, hair growth, and conditions: Extremity Color: [Right:Mottled] Hair Growth on Extremity: [Right:Yes] Temperature of Extremity: [Right:Warm] Capillary Refill: [Right:< 3 seconds] Toe Nail Assessment Left: Right: Thick: No Discolored: No Deformed: No Improper Length and Hygiene: No Electronic Signature(s) Signed: 12/05/2016 4:11:32 PM By: Alric Quan Entered By: Alric Quan on 12/05/2016 10:56:24 Kimberly [name] ([id number]) -------------------------------------------------------------------------------- Multi Wound Chart Details Patient Name: Kimberly [name] Date of Service: 12/05/2016 10:30 AM Medical Record Number: [id number] Patient Account Number: [id number] Date of Birth/Sex: 07/17/1945 (71 y.o.  Female) Treating RN: Carolyne Fiscal, Debi Primary Care Margan Elias: Cathlean Cower Other Clinician: Referring Zayonna Ayuso: Wilfred Lacy Treating Taviana Westergren/Extender: Frann Rider in Treatment: 0 Vital Signs Height(in): 61 Pulse(bpm): 57 Weight(lbs): 111.9 Blood Pressure(mmHg): 108/93 Body Mass Index(BMI): 21 Temperature(F): 98.4 Respiratory Rate 20 (breaths/min): Photos: [1:No Photos] [2:No Photos] [N/A:N/A] Wound Location: [1:Right Lower Leg - Lateral] [2:Right Lower Leg - Posterior] [N/A:N/A] Wounding Event: [1:Trauma] [2:Trauma] [N/A:N/A] Primary Etiology: [1:Trauma, Other] [2:Trauma, Other] [N/A:N/A] Comorbid History: [1:Anemia, Asthma, Chronic Obstructive Pulmonary Disease (COPD), Congestive Heart Failure] [2:Anemia, Asthma, Chronic Obstructive Pulmonary Disease (COPD), Congestive Heart Failure] [N/A:N/A] Date Acquired: [1:09/03/2016] [2:09/03/2016] [N/A:N/A] Weeks of Treatment: [1:0] [2:0] [N/A:N/A] Wound Status: [1:Open] [2:Open] [N/A:N/A] Measurements L x W x D [1:2x0.7x0.2] [2:1.6x1.2x0.1] [N/A:N/A] (cm) Area (cm) : [1:1.1] [2:1.508] [N/A:N/A]  Volume (cm) : [1:0.22] [2:0.151] [N/A:N/A] Classification: [1:Partial Thickness] [2:Partial Thickness] [N/A:N/A] Exudate Amount: [1:Large] [2:Large] [N/A:N/A] Exudate Type: [1:Serous] [2:Serosanguineous] [N/A:N/A] Exudate Color: [1:amber] [2:red, brown] [N/A:N/A] Wound Margin: [1:Distinct, outline attached] [2:Distinct, outline attached] [N/A:N/A] Granulation Amount: [1:None Present (0%)] [2:None Present (0%)] [N/A:N/A] Necrotic Amount: [1:Large (67-100%)] [2:Large (67-100%)] [N/A:N/A] Necrotic Tissue: [1:Adherent Slough] [2:Eschar, Adherent Slough] [N/A:N/A] Epithelialization: [1:None] [2:None] [N/A:N/A] Periwound Skin Texture: [1:No Abnormalities Noted] [2:No Abnormalities Noted] [N/A:N/A] Periwound Skin Moisture: [1:Maceration: Yes] [2:No Abnormalities Noted] [N/A:N/A] Periwound Skin Color: [1:No Abnormalities Noted] [2:No  Abnormalities Noted] [N/A:N/A] Temperature: [1:No Abnormality] [2:No Abnormality] [N/A:N/A] Tenderness on Palpation: [1:Yes] [2:Yes] [N/A:N/A] Wound Preparation: [1:Ulcer Cleansing: Rinsed/Irrigated with Saline] [2:Ulcer Cleansing: Rinsed/Irrigated with Saline] [N/A:N/A] Topical Anesthetic Applied: Topical Anesthetic Applied: Other: lidocaine 4% Other: lidocaine 4# Treatment Notes Kimberly [name], Kimberly [name] ([id number]) Wound #1 (Right, Lateral Lower Leg) 1. Cleansed with: Clean wound with Normal Saline 2. Anesthetic Topical Lidocaine 4% cream to wound bed prior to debridement 4. Dressing Applied: Santyl Ointment 5. Secondary Dressing Applied ABD Pad Kerlix/Conform Non-Adherent pad 7. Secured with Tape Notes netting #4 Wound #2 (Right, Posterior Lower Leg) 1. Cleansed with: Clean wound with Normal Saline 2. Anesthetic Topical Lidocaine 4% cream to wound bed prior to debridement 4. Dressing Applied: Santyl Ointment 5. Secondary Dressing Applied ABD Pad Kerlix/Conform Non-Adherent pad 7. Secured with Tape Notes netting #4 Electronic Signature(s) Signed: 12/05/2016 11:26:13 AM By: Christin Fudge MD, FACS Entered By: Christin Fudge on 12/05/2016 11:26:13 Kimberly [name] ([id number]) -------------------------------------------------------------------------------- Nambe Details Patient Name: Kimberly [name] Date of Service: 12/05/2016 10:30 AM Medical Record Number: [id number] Patient Account Number: [id number] Date of Birth/Sex: [birth year] (71 y.o. Female) Treating RN: Ahmed Prima Primary Care Alezander Dimaano: Cathlean Cower Other Clinician: Referring Amabel Stmarie: Wilfred Lacy Treating Itzae Miralles/Extender: Frann Rider in Treatment: 0 Active Inactive ` Nutrition Nursing Diagnoses: Imbalanced nutrition Potential for alteratiion in Nutrition/Potential for imbalanced nutrition Goals: Patient/caregiver agrees to and verbalizes understanding of need to use  nutritional supplements and/or vitamins as prescribed Date Initiated: 12/05/2016 Target Resolution Date: 02/28/2017 Goal Status: Active Interventions: Assess patient nutrition upon admission and as needed per policy Notes: ` Orientation to the Wound Care Program Nursing Diagnoses: Knowledge deficit related to the wound healing center program Goals: Patient/caregiver will verbalize understanding of the [facility] Date Initiated: 12/05/2016 Target Resolution Date: 12/27/2016 Goal Status: Active Interventions: Provide education on orientation to the wound center Notes: ` Pain, Acute or Chronic Nursing Diagnoses: Pain, acute or chronic: actual or potential Potential alteration in comfort, pain Goals: Patient/caregiver will verbalize adequate pain control between visits Date Initiated: 12/05/2016 Target Resolution Date: 03/28/2017 Goal Status: Active Kimberly [name], Kimberly [name] ([id number]) Interventions: Complete pain assessment as per visit requirements Notes: ` Wound/Skin Impairment Nursing Diagnoses: Impaired tissue integrity Knowledge deficit related to smoking impact on wound healing Knowledge deficit related to ulceration/compromised skin integrity Goals: Ulcer/skin breakdown will have a volume reduction of 80% by week 12 Date Initiated: 12/05/2016 Target Resolution Date: 03/28/2017 Goal Status: Active Interventions: Assess patient/caregiver ability to perform ulcer/skin care regimen upon admission and as needed Assess ulceration(s) every visit Provide education on smoking Notes: Electronic Signature(s) Signed: 12/05/2016 4:11:32 PM By: Alric Quan Entered By: Alric Quan on 12/05/2016 11:05:16 Kimberly [name] ([id number]) -------------------------------------------------------------------------------- Pain Assessment Details Patient Name: Kimberly [name] Date of Service: 12/05/2016 10:30 AM Medical Record Number: [id number] Patient Account Number:  [id number] Date of Birth/Sex: 1945-08-27 (71 y.o. Female) Treating RN: Ahmed Prima Primary Care Autum Benfer: Cathlean Cower Other Clinician: Referring Christoffer Currier: Wilfred Lacy  Treating Charlesetta Milliron/Extender: Frann Rider in Treatment: 0 Active Problems Location of Pain Severity and Description of Pain Patient Has Paino No Site Locations Pain Management and Medication Current Pain Management: Electronic Signature(s) Signed: 12/05/2016 4:11:32 PM By: Alric Quan Entered By: Alric Quan on 12/05/2016 10:26:37 Kimberly [name] ([id number]) -------------------------------------------------------------------------------- Patient/Caregiver Education Details Patient Name: Kimberly [name] Date of Service: 12/05/2016 10:30 AM Medical Record Number: [id number] Patient Account Number: [id number] Date of Birth/Gender: Oct 01, 1945 (71 y.o. Female) Treating RN: Ahmed Prima Primary Care Physician: Cathlean Cower Other Clinician: Referring Physician: Wilfred Lacy Treating Physician/Extender: Frann Rider in Treatment: 0 Education Assessment Education Provided To: Patient Education Topics Provided Nutrition: Handouts: Nutrition Methods: Explain/Verbal Responses: State content correctly Smoking and Wound Healing: Handouts: Smoking and Wound Healing Methods: Explain/Verbal Responses: State content correctly Welcome To [facility]: Handouts: Welcome To [facility] Methods: Explain/Verbal Responses: State content correctly Wound/Skin Impairment: Handouts: Other: change dressing as ordered Methods: Demonstration, Explain/Verbal Responses: State content correctly Electronic Signature(s) Signed: 12/05/2016 4:11:32 PM By: Alric Quan Entered By: Alric Quan on 12/05/2016 11:06:23 Kimberly [name] ([id number]) -------------------------------------------------------------------------------- Wound Assessment Details Patient Name: Kimberly [name] Date of Service: 12/05/2016 10:30 AM Medical Record Number: [id number] Patient Account Number: [id number] Date of Birth/Sex: 03-26-1945 (71 y.o. Female) Treating RN: Carolyne Fiscal, Debi Primary Care Ahnika Hannibal: Cathlean Cower Other Clinician: Referring Kalina Morabito: Wilfred Lacy Treating Jammal Sarr/Extender: Frann Rider in Treatment: 0 Wound Status Wound Number: 1 Primary Trauma, Other Etiology: Wound Location: Right Lower Leg - Lateral Wound Open Wounding Event: Trauma Status: Date Acquired: 09/03/2016 Comorbid Anemia, Asthma, Chronic Obstructive Weeks Of Treatment: 0 History: Pulmonary Disease (COPD), Congestive Heart Clustered Wound: No Failure Photos Photo Uploaded By: Alric Quan on 12/05/2016 16:08:03 Wound Measurements Length: (cm) 2 Width: (cm) 0.7 Depth: (cm) 0.2 Area: (cm) 1.1 Volume: (cm) 0.22 % Reduction in Area: % Reduction in Volume: Epithelialization: None Tunneling: No Undermining: No Wound Description Classification: Partial Thickness Wound Margin: Distinct, outline attached Exudate Amount: Large Exudate Type: Serous Exudate Color: amber Foul Odor After Cleansing: No Wound Bed Granulation Amount: None Present (0%) Necrotic Amount: Large (67-100%) Necrotic Quality: Adherent Slough Periwound Skin Texture Texture Color No Abnormalities Noted: No No Abnormalities Noted: No Moisture Temperature / Pain No Abnormalities Noted: No Temperature: No Abnormality Kimberly [name], Kimberly B. ([id number]) Maceration: Yes Tenderness on Palpation: Yes Wound Preparation Ulcer Cleansing: Rinsed/Irrigated with Saline Topical Anesthetic Applied: Other: lidocaine 4%, Electronic Signature(s) Signed: 12/05/2016 4:11:32 PM By: Alric Quan Entered By: Alric Quan on 12/05/2016 10:52:41 Kimberly [name] ([id number]) -------------------------------------------------------------------------------- Wound Assessment Details Patient Name: Kimberly [name] Date of  Service: 12/05/2016 10:30 AM Medical Record Number: [id number] Patient Account Number: [id number] Date of Birth/Sex: 07/19/1945 (71 y.o. Female) Treating RN: Carolyne Fiscal, Debi Primary Care Chyann Ambrocio: Cathlean Cower Other Clinician: Referring Delisa Finck: Wilfred Lacy Treating Jeanell Mangan/Extender: Frann Rider in Treatment: 0 Wound Status Wound Number: 2 Primary Trauma, Other Etiology: Wound Location: Right Lower Leg - Posterior Wound Open Wounding Event: Trauma Status: Date Acquired: 09/03/2016 Comorbid Anemia, Asthma, Chronic Obstructive Weeks Of Treatment: 0 History: Pulmonary Disease (COPD), Congestive Heart Clustered Wound: No Failure Photos Photo Uploaded By: Alric Quan on 12/05/2016 16:08:03 Wound Measurements Length: (cm) 1.6 Width: (cm) 1.2 Depth: (cm) 0.1 Area: (cm) 1.508 Volume: (cm) 0.151 % Reduction in Area: % Reduction in Volume: Epithelialization: None Tunneling: No Undermining: No Wound Description Classification: Partial Thickness Wound Margin: Distinct, outline attached Exudate Amount: Large Exudate Type: Serosanguineous Exudate Color: red, brown Foul Odor After Cleansing: No Slough/Fibrino Yes  Wound Bed Granulation Amount: None Present (0%) Necrotic Amount: Large (67-100%) Necrotic Quality: Eschar, Adherent Slough Periwound Skin Texture Texture Color No Abnormalities Noted: No No Abnormalities Noted: No Moisture Temperature / Pain No Abnormalities Noted: No Temperature: No Abnormality Kimberly [name], Kimberly B. ([id number]) Tenderness on Palpation: Yes Wound Preparation Ulcer Cleansing: Rinsed/Irrigated with Saline Topical Anesthetic Applied: Other: lidocaine 4#, Treatment Notes Wound #2 (Right, Posterior Lower Leg) 1. Cleansed with: Clean wound with Normal Saline 2. Anesthetic Topical Lidocaine 4% cream to wound bed prior to debridement 4. Dressing Applied: Santyl Ointment 5. Secondary Dressing Applied ABD  Pad Kerlix/Conform Non-Adherent pad 7. Secured with Tape Notes netting #4 Electronic Signature(s) Signed: 12/05/2016 4:11:32 PM By: Alric Quan Entered By: Alric Quan on 12/05/2016 10:55:36 Kimberly [name] ([id number]) -------------------------------------------------------------------------------- Vitals Details Patient Name: Kimberly [name] Date of Service: 12/05/2016 10:30 AM Medical Record Number: [id number] Patient Account Number: [id number] Date of Birth/Sex: [birth year] (71 y.o. Female) Treating RN: Carolyne Fiscal, Debi Primary Care Tiyah Zelenak: Cathlean Cower Other Clinician: Referring Geary Rufo: Wilfred Lacy Treating Quetzalli Clos/Extender: Frann Rider in Treatment: 0 Vital Signs Time Taken: 10:26 Temperature (F): 98.4 Height (in): 61 Pulse (bpm): 76 Source: Measured Respiratory Rate (breaths/min): 20 Weight (lbs): 111.9 Blood Pressure (mmHg): 108/93 Source: Measured Reference Range: 80 - 120 mg / dl Body Mass Index (BMI): 21.1 Electronic Signature(s) Signed: 12/05/2016 4:11:32 PM By: Alric Quan Entered By: Alric Quan on 12/05/2016 10:27:06

## 2016-12-15 ENCOUNTER — Other Ambulatory Visit
Admission: RE | Admit: 2016-12-15 | Discharge: 2016-12-15 | Disposition: A | Discharge status: Home / Self Care | Payer: Medicare Other | Source: Ambulatory Visit | Attending: Cardiovascular Disease | Admitting: Cardiovascular Disease

## 2016-12-15 DIAGNOSIS — Z01812 Encounter for preprocedural laboratory examination: Secondary | ICD-10-CM | POA: Insufficient documentation

## 2016-12-15 LAB — CBC
HCT: 41.4 % (ref 35.0–47.0)
Hemoglobin: 13.7 g/dL (ref 12.0–16.0)
MCH: 29.1 pg (ref 26.0–34.0)
MCHC: 33 g/dL (ref 32.0–36.0)
MCV: 88.1 fL (ref 80.0–100.0)
PLATELETS: 290 10*3/uL (ref 150–440)
RBC: 4.7 MIL/uL (ref 3.80–5.20)
RDW: 16.1 % — AB (ref 11.5–14.5)
WBC: 5.6 10*3/uL (ref 3.6–11.0)

## 2016-12-15 LAB — PROTIME-INR
INR: 1.02
PROTHROMBIN TIME: 13.3 s (ref 11.4–15.2)

## 2016-12-15 LAB — BASIC METABOLIC PANEL
Anion gap: 9 (ref 5–15)
BUN: 19 mg/dL (ref 6–20)
CALCIUM: 8.9 mg/dL (ref 8.9–10.3)
CO2: 30 mmol/L (ref 22–32)
CREATININE: 0.87 mg/dL (ref 0.44–1.00)
Chloride: 100 mmol/L — ABNORMAL LOW (ref 101–111)
GFR calc Af Amer: >60 mL/min (ref >60)
GLUCOSE: 113 mg/dL — AB (ref 65–99)
Potassium: 4.2 mmol/L (ref 3.5–5.1)
SODIUM: 139 mmol/L (ref 135–145)

## 2016-12-17 ENCOUNTER — Encounter (HOSPITAL_COMMUNITY)
Admission: RE | Disposition: A | Discharge status: Home / Self Care | Payer: Self-pay | Source: Ambulatory Visit | Attending: Cardiovascular Disease

## 2016-12-17 ENCOUNTER — Telehealth: Payer: Self-pay | Admitting: Cardiovascular Disease

## 2016-12-17 ENCOUNTER — Ambulatory Visit (HOSPITAL_COMMUNITY)
Admission: RE | Admit: 2016-12-17 | Discharge: 2016-12-17 | Disposition: A | Discharge status: Home / Self Care | Payer: Medicare Other | Source: Ambulatory Visit | Attending: Cardiovascular Disease | Admitting: Cardiovascular Disease

## 2016-12-17 ENCOUNTER — Encounter (HOSPITAL_COMMUNITY): Payer: Self-pay | Admitting: *Deleted

## 2016-12-17 ENCOUNTER — Other Ambulatory Visit: Payer: Self-pay

## 2016-12-17 DIAGNOSIS — M810 Age-related osteoporosis without current pathological fracture: Secondary | ICD-10-CM | POA: Diagnosis not present

## 2016-12-17 DIAGNOSIS — I110 Hypertensive heart disease with heart failure: Secondary | ICD-10-CM | POA: Insufficient documentation

## 2016-12-17 DIAGNOSIS — I428 Other cardiomyopathies: Secondary | ICD-10-CM | POA: Insufficient documentation

## 2016-12-17 DIAGNOSIS — Z7982 Long term (current) use of aspirin: Secondary | ICD-10-CM | POA: Diagnosis not present

## 2016-12-17 DIAGNOSIS — I252 Old myocardial infarction: Secondary | ICD-10-CM | POA: Insufficient documentation

## 2016-12-17 DIAGNOSIS — E785 Hyperlipidemia, unspecified: Secondary | ICD-10-CM | POA: Insufficient documentation

## 2016-12-17 DIAGNOSIS — K219 Gastro-esophageal reflux disease without esophagitis: Secondary | ICD-10-CM | POA: Insufficient documentation

## 2016-12-17 DIAGNOSIS — I70211 Atherosclerosis of native arteries of extremities with intermittent claudication, right leg: Secondary | ICD-10-CM | POA: Diagnosis not present

## 2016-12-17 DIAGNOSIS — F419 Anxiety disorder, unspecified: Secondary | ICD-10-CM | POA: Insufficient documentation

## 2016-12-17 DIAGNOSIS — J449 Chronic obstructive pulmonary disease, unspecified: Secondary | ICD-10-CM | POA: Diagnosis not present

## 2016-12-17 DIAGNOSIS — I70202 Unspecified atherosclerosis of native arteries of extremities, left leg: Secondary | ICD-10-CM | POA: Diagnosis not present

## 2016-12-17 DIAGNOSIS — X58XXXD Exposure to other specified factors, subsequent encounter: Secondary | ICD-10-CM | POA: Insufficient documentation

## 2016-12-17 DIAGNOSIS — I739 Peripheral vascular disease, unspecified: Secondary | ICD-10-CM | POA: Diagnosis present

## 2016-12-17 DIAGNOSIS — S80921D Unspecified superficial injury of right lower leg, subsequent encounter: Secondary | ICD-10-CM | POA: Diagnosis not present

## 2016-12-17 DIAGNOSIS — F17210 Nicotine dependence, cigarettes, uncomplicated: Secondary | ICD-10-CM | POA: Diagnosis not present

## 2016-12-17 DIAGNOSIS — I2510 Atherosclerotic heart disease of native coronary artery without angina pectoris: Secondary | ICD-10-CM | POA: Insufficient documentation

## 2016-12-17 DIAGNOSIS — I272 Pulmonary hypertension, unspecified: Secondary | ICD-10-CM | POA: Insufficient documentation

## 2016-12-17 DIAGNOSIS — Z9862 Peripheral vascular angioplasty status: Secondary | ICD-10-CM

## 2016-12-17 DIAGNOSIS — I5022 Chronic systolic (congestive) heart failure: Secondary | ICD-10-CM | POA: Insufficient documentation

## 2016-12-17 DIAGNOSIS — I70238 Atherosclerosis of native arteries of right leg with ulceration of other part of lower right leg: Secondary | ICD-10-CM | POA: Diagnosis not present

## 2016-12-17 HISTORY — PX: PERIPHERAL VASCULAR INTERVENTION: CATH118257

## 2016-12-17 HISTORY — PX: ABDOMINAL AORTOGRAM W/LOWER EXTREMITY: CATH118223

## 2016-12-17 LAB — POCT ACTIVATED CLOTTING TIME
ACTIVATED CLOTTING TIME: 197 s
Activated Clotting Time: 208 seconds
Activated Clotting Time: 175 seconds

## 2016-12-17 SURGERY — ABDOMINAL AORTOGRAM W/LOWER EXTREMITY
Anesthesia: LOCAL

## 2016-12-17 MED ORDER — SODIUM CHLORIDE 0.9 % IV SOLN: 250.0000 mL | INTRAVENOUS | Status: DC | PRN

## 2016-12-17 MED ORDER — NITROGLYCERIN 1 MG/10 ML FOR IR/CATH LAB
INTRA_ARTERIAL | Status: AC
  Filled 2016-12-17: qty 10

## 2016-12-17 MED ORDER — SODIUM CHLORIDE 0.9% FLUSH: 3.0000 mL | Freq: Two times a day (BID) | INTRAVENOUS | Status: DC

## 2016-12-17 MED ORDER — HEPARIN (PORCINE) IN NACL 2-0.9 UNIT/ML-% IJ SOLN
INTRAMUSCULAR | Status: AC | PRN
  Administered 2016-12-17: 1000 mL via INTRA_ARTERIAL

## 2016-12-17 MED ORDER — LIDOCAINE HCL (PF) 1 % IJ SOLN
INTRAMUSCULAR | Status: DC | PRN
  Administered 2016-12-17: 10 mL

## 2016-12-17 MED ORDER — HEPARIN SODIUM (PORCINE) 1000 UNIT/ML IJ SOLN
INTRAMUSCULAR | Status: AC
Start: 2016-12-17 — End: ?
  Filled 2016-12-17: qty 1

## 2016-12-17 MED ORDER — NITROGLYCERIN 1 MG/10 ML FOR IR/CATH LAB
INTRA_ARTERIAL | Status: DC | PRN
  Administered 2016-12-17: 200 ug via INTRA_ARTERIAL

## 2016-12-17 MED ORDER — SODIUM CHLORIDE 0.9% FLUSH: 3.0000 mL | INTRAVENOUS | Status: DC | PRN

## 2016-12-17 MED ORDER — CLOPIDOGREL BISULFATE 300 MG PO TABS
ORAL_TABLET | ORAL | Status: AC
  Filled 2016-12-17: qty 2

## 2016-12-17 MED ORDER — CLOPIDOGREL BISULFATE 300 MG PO TABS
ORAL_TABLET | ORAL | Status: DC | PRN
  Administered 2016-12-17: 600 mg via ORAL

## 2016-12-17 MED ORDER — SODIUM CHLORIDE 0.9 % IV SOLN: INTRAVENOUS | Status: AC

## 2016-12-17 MED ORDER — CLOPIDOGREL BISULFATE 75 MG PO TABS: 75.0000 mg | ORAL_TABLET | Freq: Every day | ORAL | 6 refills | Status: DC

## 2016-12-17 MED ORDER — MIDAZOLAM HCL 2 MG/2ML IJ SOLN
INTRAMUSCULAR | Status: AC
Start: 2016-12-17 — End: ?
  Filled 2016-12-17: qty 2

## 2016-12-17 MED ORDER — HEPARIN SODIUM (PORCINE) 1000 UNIT/ML IJ SOLN
INTRAMUSCULAR | Status: DC | PRN
  Administered 2016-12-17: 4500 [IU] via INTRAVENOUS
  Administered 2016-12-17: 2000 [IU] via INTRAVENOUS

## 2016-12-17 MED ORDER — SODIUM CHLORIDE 0.9 % IV SOLN
INTRAVENOUS | Status: DC
  Administered 2016-12-17: 07:00:00 via INTRAVENOUS

## 2016-12-17 MED ORDER — FENTANYL CITRATE (PF) 100 MCG/2ML IJ SOLN
INTRAMUSCULAR | Status: AC
  Filled 2016-12-17: qty 2

## 2016-12-17 MED ORDER — IODIXANOL 320 MG/ML IV SOLN
INTRAVENOUS | Status: DC | PRN
  Administered 2016-12-17: 200 mL via INTRA_ARTERIAL

## 2016-12-17 MED ORDER — MIDAZOLAM HCL 2 MG/2ML IJ SOLN
INTRAMUSCULAR | Status: DC | PRN
Start: 2016-12-17 — End: 2016-12-17
  Administered 2016-12-17 (×2): 1 mg via INTRAVENOUS

## 2016-12-17 MED ORDER — FENTANYL CITRATE (PF) 100 MCG/2ML IJ SOLN
INTRAMUSCULAR | Status: DC | PRN
  Administered 2016-12-17: 25 ug via INTRAVENOUS
  Administered 2016-12-17: 50 ug via INTRAVENOUS
  Administered 2016-12-17: 25 ug via INTRAVENOUS

## 2016-12-17 MED ORDER — HEPARIN (PORCINE) IN NACL 2-0.9 UNIT/ML-% IJ SOLN
INTRAMUSCULAR | Status: AC
  Filled 2016-12-17: qty 1000

## 2016-12-17 MED ORDER — LIDOCAINE HCL (PF) 1 % IJ SOLN
INTRAMUSCULAR | Status: AC
  Filled 2016-12-17: qty 30

## 2016-12-17 MED ORDER — ASPIRIN 81 MG PO CHEW: 81.0000 mg | CHEWABLE_TABLET | ORAL | Status: DC

## 2016-12-17 SURGICAL SUPPLY — 27 items
BALLN LUTONIX DCB 6X60X130 (BALLOONS) ×3
BALLN STERLING OTW 4X40X135 (BALLOONS) ×3
BALLN STERLING OTW 6X40X135 (BALLOONS) ×3
BALLOON LUTONIX DCB 6X60X130 (BALLOONS) ×2 IMPLANT
BALLOON STERLING OTW 4X40X135 (BALLOONS) ×2 IMPLANT
BALLOON STERLING OTW 6X40X135 (BALLOONS) ×2 IMPLANT
CATH ANGIO 5F PIGTAIL 65CM (CATHETERS) ×3 IMPLANT
CATH CROSS OVER TEMPO 5F (CATHETERS) ×3 IMPLANT
CATH NAVICROSS ST .035X90CM (MICROCATHETER) ×3 IMPLANT
COVER PRB 48X5XTLSCP FOLD TPE (BAG) ×2 IMPLANT
COVER PROBE 5X48 (BAG) ×1
GUIDEWIRE LT ZIPWIRE 035X260 (WIRE) ×3 IMPLANT
KIT ENCORE 26 ADVANTAGE (KITS) ×3 IMPLANT
KIT MICROINTRODUCER STIFF 5F (SHEATH) ×3 IMPLANT
KIT PV (KITS) ×3 IMPLANT
SHEATH PINNACLE 5F 10CM (SHEATH) ×3 IMPLANT
SHEATH PINNACLE ST 6F 45CM (SHEATH) ×3 IMPLANT
SHIELD RADPAD SCOOP 12X17 (MISCELLANEOUS) ×3 IMPLANT
STENT INNOVA 7X60X130 (Permanent Stent) ×3 IMPLANT
STOPCOCK MORSE 400PSI 3WAY (MISCELLANEOUS) ×3 IMPLANT
SYRINGE MEDRAD AVANTA MACH 7 (SYRINGE) ×3 IMPLANT
TRANSDUCER W/STOPCOCK (MISCELLANEOUS) ×3 IMPLANT
TRAY PV CATH (CUSTOM PROCEDURE TRAY) ×3 IMPLANT
TUBING CIL FLEX 10 FLL-RA (TUBING) ×3 IMPLANT
WIRE G V18X300CM (WIRE) ×3 IMPLANT
WIRE HITORQ VERSACORE ST 145CM (WIRE) ×3 IMPLANT
WIRE TORQFLEX AUST .018X40CM (WIRE) ×3 IMPLANT

## 2016-12-17 NOTE — Telephone Encounter (Signed)
Per Dr. Fletcher Anon: "S/p right common femoral artery stent placement.  Schedule an ABI and lower ext arterial duplex within 2 weeks. Thanks. "  Orders placed. Notified pt that scheduling will call tomorrow to set up this appt. Pt agreeable. Routed to scheduling.

## 2016-12-17 NOTE — Progress Notes (Signed)
6 french sheath was removed from RFA using manual pressure held for 20 minutes. Patient tolerated well and no hematoma was noted.  Groin site looks good and tegaderm applied to the site.  Post care instructions given to patient.  Post vitals: BP 106/53, sats 100%, HR 72.

## 2016-12-17 NOTE — Interval H&P Note (Signed)
History and Physical Interval Note:  12/17/2016 8:35 AM  Kimberly [name]  has presented today for surgery, with the diagnosis of pad  The various methods of treatment have been discussed with the patient and family. After consideration of risks, benefits and other options for treatment, the patient has consented to  Procedure(s): ABDOMINAL AORTOGRAM W/LOWER EXTREMITY (N/A) as a surgical intervention .  The patient's history has been reviewed, patient examined, no change in status, stable for surgery.  I have reviewed the patient's chart and labs.  Questions were answered to the patient's satisfaction.     Kathlyn [place]

## 2016-12-17 NOTE — Discharge Instructions (Signed)

## 2016-12-17 NOTE — Progress Notes (Signed)
Site area: LFA Site Prior to Removal:  Level 0 Pressure Applied For: Manual:  yes  Patient Status During Pull: stable  Post Pull Site:  Level 0 Post Pull Instructions Given:  yes Post Pull Pulses Present: doppler Dressing Applied:  tegaderm Bedrest begins @  Comments:

## 2016-12-18 ENCOUNTER — Ambulatory Visit: Payer: Medicare Other | Admitting: Cardiology

## 2016-12-18 ENCOUNTER — Encounter (HOSPITAL_COMMUNITY): Payer: Self-pay | Admitting: Cardiovascular Disease

## 2016-12-18 NOTE — Telephone Encounter (Signed)
Scheduled 12/31/16  Nothing further needed.

## 2016-12-19 ENCOUNTER — Ambulatory Visit: Payer: Medicare Other | Admitting: Surgery

## 2016-12-19 ENCOUNTER — Encounter (HOSPITAL_COMMUNITY): Payer: Medicare Other

## 2016-12-22 ENCOUNTER — Encounter: Payer: Medicare Other | Attending: Physician Assistant | Admitting: Physician Assistant

## 2016-12-22 DIAGNOSIS — I890 Lymphedema, not elsewhere classified: Secondary | ICD-10-CM | POA: Diagnosis not present

## 2016-12-22 DIAGNOSIS — L97212 Non-pressure chronic ulcer of right calf with fat layer exposed: Secondary | ICD-10-CM | POA: Insufficient documentation

## 2016-12-22 DIAGNOSIS — I110 Hypertensive heart disease with heart failure: Secondary | ICD-10-CM | POA: Insufficient documentation

## 2016-12-22 DIAGNOSIS — I739 Peripheral vascular disease, unspecified: Secondary | ICD-10-CM | POA: Diagnosis not present

## 2016-12-22 DIAGNOSIS — E785 Hyperlipidemia, unspecified: Secondary | ICD-10-CM | POA: Insufficient documentation

## 2016-12-22 DIAGNOSIS — F17218 Nicotine dependence, cigarettes, with other nicotine-induced disorders: Secondary | ICD-10-CM | POA: Diagnosis not present

## 2016-12-22 DIAGNOSIS — I509 Heart failure, unspecified: Secondary | ICD-10-CM | POA: Insufficient documentation

## 2016-12-22 DIAGNOSIS — J449 Chronic obstructive pulmonary disease, unspecified: Secondary | ICD-10-CM | POA: Diagnosis not present

## 2016-12-22 DIAGNOSIS — S81811A Laceration without foreign body, right lower leg, initial encounter: Secondary | ICD-10-CM | POA: Diagnosis not present

## 2016-12-22 DIAGNOSIS — G43909 Migraine, unspecified, not intractable, without status migrainosus: Secondary | ICD-10-CM | POA: Diagnosis not present

## 2016-12-24 NOTE — Progress Notes (Signed)
Kimberly [name], Kimberly [name] ([id number]) Visit Report for 12/22/2016 Arrival Information Details Patient Name: KARSTEN, VAUGHN Date of Service: 12/22/2016 2:45 PM Medical Record Number: [id number] Patient Account Number: [id number] Date of Birth/Sex: Jun 14, 1945 (71 y.o. Female) Treating RN: Ahmed Prima Primary Care Deaveon Schoen: Cathlean Cower Other Clinician: Referring Bulah Lurie: Cathlean Cower Treating Jeromiah Ohalloran/Extender: Frann Rider in Treatment: 2 Visit Information History Since Last Visit All ordered tests and consults were completed: No Patient Arrived: Ambulatory Added or deleted any medications: No Arrival Time: 15:06 Any new allergies or adverse reactions: No Accompanied By: husband Had a fall or experienced change in No Transfer Assistance: None activities of daily living that may affect Patient Identification Verified: Yes risk of falls: Secondary Verification Process Yes Signs or symptoms of abuse/neglect since last visito No Completed: Hospitalized since last visit: No Patient Requires Transmission-Based No Has Dressing in Place as Prescribed: Yes Precautions: Pain Present Now: No Patient Has Alerts: Yes Patient Alerts: R ABI 0.62 from EPIC L ABI 0.72 from [facility] Electronic Signature(s) Signed: 12/22/2016 3:29:03 PM By: Alric Quan Entered By: Alric Quan on 12/22/2016 15:29:03 Kimberly [name] ([id number]) -------------------------------------------------------------------------------- Encounter Discharge Information Details Patient Name: Kimberly [name] Date of Service: 12/22/2016 2:45 PM Medical Record Number: [id number] Patient Account Number: [id number] Date of Birth/Sex: 1945/06/07 (71 y.o. Female) Treating RN: Ahmed Prima Primary Care Kadesia Robel: Cathlean Cower Other Clinician: Referring Melika Reder: Cathlean Cower Treating Hannia Matchett/Extender: Melburn Hake, HOYT Weeks in Treatment: 2 Encounter Discharge Information Items Discharge Pain Level: 0 Discharge Condition:  Stable Ambulatory Status: Ambulatory Discharge Destination: Home Transportation: Private Auto Accompanied By: husband Schedule Follow-up Appointment: Yes Medication Reconciliation completed and No provided to Patient/Care Aymee Fomby: Provided on Clinical Summary of Care: 12/22/2016 Form Type Recipient Paper Patient MW Electronic Signature(s) Signed: 12/23/2016 4:48:37 PM By: Ruthine Dose Previous Signature: 12/22/2016 3:29:33 PM Version By: Alric Quan Entered By: Ruthine Dose on 12/22/2016 15:55:36 Kimberly [name] ([id number]) -------------------------------------------------------------------------------- Lower Extremity Assessment Details Patient Name: Kimberly [name] Date of Service: 12/22/2016 2:45 PM Medical Record Number: [id number] Patient Account Number: [id number] Date of Birth/Sex: 23-Mar-1945 (71 y.o. Female) Treating RN: Ahmed Prima Primary Care Antonella Upson: Cathlean Cower Other Clinician: Referring Jacson Rapaport: Cathlean Cower Treating Danta Baumgardner/Extender: Melburn Hake, HOYT Weeks in Treatment: 2 Vascular Assessment Pulses: Dorsalis Pedis Palpable: [Left:Yes] [Right:Yes] Posterior Tibial Extremity colors, hair growth, and conditions: Extremity Color: [Left:Mottled] [Right:Mottled] Temperature of Extremity: [Left:Warm] [Right:Warm] Capillary Refill: [Left:< 3 seconds] [Right:< 3 seconds] Toe Nail Assessment Left: Right: Thick: No No Discolored: No No Deformed: No No Improper Length and Hygiene: No No Electronic Signature(s) Signed: 12/22/2016 3:27:32 PM By: Alric Quan Entered By: Alric Quan on 12/22/2016 15:27:31 Kimberly [name] ([id number]) -------------------------------------------------------------------------------- Multi Wound Chart Details Patient Name: Kimberly [name] Date of Service: 12/22/2016 2:45 PM Medical Record Number: [id number] Patient Account Number: [id number] Date of Birth/Sex: 1945-10-21 (71 y.o. Female) Treating RN: Ahmed Prima Primary Care Xylia Scherger: Cathlean Cower Other Clinician: Referring Sheryle Vice: Cathlean Cower Treating Jisselle Poth/Extender: Melburn Hake, HOYT Weeks in Treatment: 2 Vital Signs Height(in): 61 Pulse(bpm): 62 Weight(lbs): 111.9 Blood Pressure(mmHg): 58/80 Body Mass Index(BMI): 21 Temperature(F): 98.6 Respiratory Rate 20 (breaths/min): Photos: [1:No Photos] [2:No Photos] [N/A:N/A] Wound Location: [1:Right Lower Leg - Lateral] [2:Right Lower Leg - Posterior] [N/A:N/A] Wounding Event: [1:Trauma] [2:Trauma] [N/A:N/A] Primary Etiology: [1:Trauma, Other] [2:Trauma, Other] [N/A:N/A] Comorbid History: [1:Anemia, Asthma, Chronic Obstructive Pulmonary Disease (COPD), Congestive Heart Failure] [2:Anemia, Asthma, Chronic Obstructive Pulmonary Disease (COPD), Congestive Heart Failure] [N/A:N/A] Date Acquired: [1:09/03/2016] [2:09/03/2016] [N/A:N/A] Weeks of Treatment: [1:2] [2:2] [N/A:N/A] Wound Status: [1:Open] [2:Open] [  N/A:N/A] Measurements L x W x D [1:2x0.8x0.1] [2:1.7x0.8x0.1] [N/A:N/A] (cm) Area (cm) : [1:1.257] [2:1.068] [N/A:N/A] Volume (cm) : [1:0.126] [2:0.107] [N/A:N/A] % Reduction in Area: [1:-14.30%] [2:29.20%] [N/A:N/A] % Reduction in Volume: [1:42.70%] [2:29.10%] [N/A:N/A] Classification: [1:Partial Thickness] [2:Partial Thickness] [N/A:N/A] Exudate Amount: [1:Large] [2:Large] [N/A:N/A] Exudate Type: [1:Serous] [2:Serous] [N/A:N/A] Exudate Color: [1:amber] [2:amber] [N/A:N/A] Wound Margin: [1:Distinct, outline attached] [2:Distinct, outline attached] [N/A:N/A] Granulation Amount: [1:None Present (0%)] [2:None Present (0%)] [N/A:N/A] Necrotic Amount: [1:Large (67-100%)] [2:Large (67-100%)] [N/A:N/A] Epithelialization: [1:None] [2:None] [N/A:N/A] Periwound Skin Texture: [1:No Abnormalities Noted] [2:No Abnormalities Noted] [N/A:N/A] Periwound Skin Moisture: [1:Maceration: Yes] [2:No Abnormalities Noted] [N/A:N/A] Periwound Skin Color: [1:No Abnormalities Noted] [2:No Abnormalities  Noted] [N/A:N/A] Temperature: [1:No Abnormality] [2:No Abnormality] [N/A:N/A] Tenderness on Palpation: [1:Yes] [2:Yes] [N/A:N/A] Wound Preparation: [1:Ulcer Cleansing: Rinsed/Irrigated with Saline] [2:Ulcer Cleansing: Rinsed/Irrigated with Saline] [N/A:N/A] Topical Anesthetic Applied: Topical Anesthetic Applied: Other: lidocaine 4% Other: lidocaine 4# Treatment Notes Kimberly, [name] ([id number]) Electronic Signature(s) Signed: 12/22/2016 3:27:48 PM By: Alric Quan Entered By: Alric Quan on 12/22/2016 15:27:48 Kimberly [name] ([id number]) -------------------------------------------------------------------------------- Multi-Disciplinary Care Plan Details Patient Name: Kimberly [name] Date of Service: 12/22/2016 2:45 PM Medical Record Number: [id number] Patient Account Number: [id number] Date of Birth/Sex: 1945-05-05 (71 y.o. Female) Treating RN: Ahmed Prima Primary Care Bristol Soy: Cathlean Cower Other Clinician: Referring Dejour Vos: Cathlean Cower Treating Azeem Poorman/Extender: Melburn Hake, HOYT Weeks in Treatment: 2 Active Inactive ` Nutrition Nursing Diagnoses: Imbalanced nutrition Potential for alteratiion in Nutrition/Potential for imbalanced nutrition Goals: Patient/caregiver agrees to and verbalizes understanding of need to use nutritional supplements and/or vitamins as prescribed Date Initiated: 12/05/2016 Target Resolution Date: 02/28/2017 Goal Status: Active Interventions: Assess patient nutrition upon admission and as needed per policy Notes: ` Orientation to the Wound Care Program Nursing Diagnoses: Knowledge deficit related to the wound healing center program Goals: Patient/caregiver will verbalize understanding of the [facility] Program Date Initiated: 12/05/2016 Target Resolution Date: 12/27/2016 Goal Status: Active Interventions: Provide education on orientation to the wound center Notes: ` Pain, Acute or Chronic Nursing Diagnoses: Pain, acute or  chronic: actual or potential Potential alteration in comfort, pain Goals: Patient/caregiver will verbalize adequate pain control between visits Date Initiated: 12/05/2016 Target Resolution Date: 03/28/2017 Goal Status: Active Kimberly [name], Kimberly [name] ([id number]) Interventions: Complete pain assessment as per visit requirements Notes: ` Wound/Skin Impairment Nursing Diagnoses: Impaired tissue integrity Knowledge deficit related to smoking impact on wound healing Knowledge deficit related to ulceration/compromised skin integrity Goals: Ulcer/skin breakdown will have a volume reduction of 80% by week 12 Date Initiated: 12/05/2016 Target Resolution Date: 03/28/2017 Goal Status: Active Interventions: Assess patient/caregiver ability to perform ulcer/skin care regimen upon admission and as needed Assess ulceration(s) every visit Provide education on smoking Notes: Electronic Signature(s) Signed: 12/22/2016 3:27:38 PM By: Alric Quan Entered By: Alric Quan on 12/22/2016 15:27:37 Kimberly [name] ([id number]) -------------------------------------------------------------------------------- Pain Assessment Details Patient Name: Kimberly [name] Date of Service: 12/22/2016 2:45 PM Medical Record Number: [id number] Patient Account Number: [id number] Date of Birth/Sex: 06-16-1945 (71 y.o. Female) Treating RN: Ahmed Prima Primary Care Dody Smartt: Cathlean Cower Other Clinician: Referring Elicia Lui: Cathlean Cower Treating Guage Efferson/Extender: Frann Rider in Treatment: 2 Active Problems Location of Pain Severity and Description of Pain Patient Has Paino No Site Locations Pain Management and Medication Current Pain Management: Electronic Signature(s) Signed: 12/22/2016 4:03:37 PM By: Alric Quan Entered By: Alric Quan on 12/22/2016 15:08:17 Kimberly [name] ([id number]) -------------------------------------------------------------------------------- Patient/Caregiver Education  Details Patient Name: Kimberly [name] Date of Service: 12/22/2016 2:45 PM Medical Record Number: [id number] Patient Account Number:  [id number] Date of Birth/Gender: [birth year] (71 y.o. Female) Treating RN: Ahmed Prima Primary Care Physician: Cathlean Cower Other Clinician: Referring Physician: Cathlean Cower Treating Physician/Extender: Sharalyn Ink in Treatment: 2 Education Assessment Education Provided To: Patient Education Topics Provided Wound/Skin Impairment: Handouts: Other: change dressing as ordered Methods: Demonstration, Explain/Verbal Responses: State content correctly Electronic Signature(s) Signed: 12/22/2016 4:03:37 PM By: Alric Quan Entered By: Alric Quan on 12/22/2016 15:29:49 Kimberly [name] ([id number]) -------------------------------------------------------------------------------- Wound Assessment Details Patient Name: Kimberly [name] Date of Service: 12/22/2016 2:45 PM Medical Record Number: [id number] Patient Account Number: [id number] Date of Birth/Sex: 12-Aug-1945 (71 y.o. Female) Treating RN: Carolyne Fiscal, Debi Primary Care Bryan Omura: Cathlean Cower Other Clinician: Referring Gwendalynn Eckstrom: Cathlean Cower Treating Marija Calamari/Extender: Frann Rider in Treatment: 2 Wound Status Wound Number: 1 Primary Trauma, Other Etiology: Wound Location: Right Lower Leg - Lateral Wound Open Wounding Event: Trauma Status: Date Acquired: 09/03/2016 Comorbid Anemia, Asthma, Chronic Obstructive Weeks Of Treatment: 2 History: Pulmonary Disease (COPD), Congestive Heart Clustered Wound: No Failure Photos Photo Uploaded By: Alric Quan on 12/22/2016 16:03:01 Wound Measurements Length: (cm) 2 Width: (cm) 0.8 Depth: (cm) 0.1 Area: (cm) 1.257 Volume: (cm) 0.126 % Reduction in Area: -14.3% % Reduction in Volume: 42.7% Epithelialization: None Tunneling: No Undermining: No Wound Description Classification: Partial Thickness Wound Margin: Distinct,  outline attached Exudate Amount: Large Exudate Type: Serous Exudate Color: amber Foul Odor After Cleansing: No Wound Bed Granulation Amount: None Present (0%) Necrotic Amount: Large (67-100%) Necrotic Quality: Adherent Slough Periwound Skin Texture Texture Color No Abnormalities Noted: No No Abnormalities Noted: No Moisture Temperature / Pain No Abnormalities Noted: No Temperature: No Abnormality Kimberly [name], Kimberly B. ([id number]) Maceration: Yes Tenderness on Palpation: Yes Wound Preparation Ulcer Cleansing: Rinsed/Irrigated with Saline Topical Anesthetic Applied: Other: lidocaine 4%, Treatment Notes Wound #1 (Right, Lateral Lower Leg) 1. Cleansed with: Clean wound with Normal Saline 2. Anesthetic Topical Lidocaine 4% cream to wound bed prior to debridement 4. Dressing Applied: Santyl Ointment 5. Secondary Dressing Applied Kerlix/Conform Non-Adherent pad 7. Secured with Recruitment consultant) Signed: 12/22/2016 4:03:37 PM By: Alric Quan Entered By: Alric Quan on 12/22/2016 15:13:45 Kimberly [name] ([id number]) -------------------------------------------------------------------------------- Wound Assessment Details Patient Name: Kimberly [name] Date of Service: 12/22/2016 2:45 PM Medical Record Number: [id number] Patient Account Number: [id number] Date of Birth/Sex: Jun 19, 1945 (71 y.o. Female) Treating RN: Carolyne Fiscal, Debi Primary Care Cono Gebhard: Cathlean Cower Other Clinician: Referring Jasmia Angst: Cathlean Cower Treating Jesper Stirewalt/Extender: Melburn Hake, HOYT Weeks in Treatment: 2 Wound Status Wound Number: 2 Primary Trauma, Other Etiology: Wound Location: Right Lower Leg - Posterior Wound Open Wounding Event: Trauma Status: Date Acquired: 09/03/2016 Comorbid Anemia, Asthma, Chronic Obstructive Weeks Of Treatment: 2 History: Pulmonary Disease (COPD), Congestive Heart Clustered Wound: No Failure Photos Photo Uploaded By: Alric Quan on 12/22/2016  16:03:01 Wound Measurements Length: (cm) 1.7 Width: (cm) 0.8 Depth: (cm) 0.1 Area: (cm) 1.068 Volume: (cm) 0.107 % Reduction in Area: 29.2% % Reduction in Volume: 29.1% Epithelialization: None Tunneling: No Undermining: No Wound Description Classification: Partial Thickness Wound Margin: Distinct, outline attached Exudate Amount: Large Exudate Type: Serous Exudate Color: amber Foul Odor After Cleansing: No Slough/Fibrino Yes Wound Bed Granulation Amount: None Present (0%) Necrotic Amount: Large (67-100%) Necrotic Quality: Adherent Slough Periwound Skin Texture Texture Color No Abnormalities Noted: No No Abnormalities Noted: No Moisture Temperature / Pain No Abnormalities Noted: No Temperature: No Abnormality Kimberly [name], Kimberly B. ([id number]) Tenderness on Palpation: Yes Wound Preparation Ulcer Cleansing: Rinsed/Irrigated with Saline Topical Anesthetic Applied: Other: lidocaine 4#, Treatment Notes Wound #2 (Right, Posterior Lower  Leg) 1. Cleansed with: Clean wound with Normal Saline 2. Anesthetic Topical Lidocaine 4% cream to wound bed prior to debridement 4. Dressing Applied: Santyl Ointment 5. Secondary Dressing Applied Kerlix/Conform Non-Adherent pad 7. Secured with Recruitment consultant) Signed: 12/22/2016 4:03:37 PM By: Alric Quan Entered By: Alric Quan on 12/22/2016 15:15:34 Kimberly [name] ([id number]) -------------------------------------------------------------------------------- [place] Details Patient Name: Kimberly [name] Date of Service: 12/22/2016 2:45 PM Medical Record Number: [id number] Patient Account Number: [id number] Date of Birth/Sex: 1945/08/27 (71 y.o. Female) Treating RN: Carolyne Fiscal, Debi Primary Care Kennya Schwenn: Cathlean Cower Other Clinician: Referring Kham Zuckerman: Cathlean Cower Treating Wilmoth Rasnic/Extender: Frann Rider in Treatment: 2 Vital Signs Time Taken: 15:09 Temperature (F): 98.6 Height (in): 61 Pulse (bpm):  86 Weight (lbs): 111.9 Respiratory Rate (breaths/min): 20 Body Mass Index (BMI): 21.1 Blood Pressure (mmHg): 96/80 Reference Range: 80 - 120 mg / dl Electronic Signature(s) Signed: 12/22/2016 4:03:37 PM By: Alric Quan Entered By: Alric Quan on 12/22/2016 15:10:00

## 2016-12-25 NOTE — Progress Notes (Signed)
[name], Kimberly ([id number]) Visit Report for 12/22/2016 Chief Complaint Document Details Patient Name: Kimberly [name], Kimberly [name] Date of Service: 12/22/2016 2:45 PM Medical Record Number: [id number] Patient Account Number: [id number] Date of Birth/Sex: March 05, 1945 (71 y.o. Female) Treating RN: Ahmed Prima Primary Care Provider: Cathlean Cower Other Clinician: Referring Provider: Cathlean Cower Treating Provider/Extender: Melburn Hake, HOYT Weeks in Treatment: 2 Information Obtained from: Patient Chief Complaint Patient seen for complaints of Non-Healing Wound to the right lateral calf Electronic Signature(s) Signed: 12/22/2016 5:27:57 PM By: Worthy Keeler PA-C Entered By: Worthy Keeler on 12/22/2016 15:38:18 Kimberly [name] ([id number]) -------------------------------------------------------------------------------- Debridement Details Patient Name: Kimberly [name] Date of Service: 12/22/2016 2:45 PM Medical Record Number: [id number] Patient Account Number: [id number] Date of Birth/Sex: 06-02-1945 (71 y.o. Female) Treating RN: Carolyne Fiscal, Debi Primary Care Provider: Cathlean Cower Other Clinician: Referring Provider: Cathlean Cower Treating Provider/Extender: Melburn Hake, HOYT Weeks in Treatment: 2 Debridement Performed for Wound #1 Right,Lateral Lower Leg Assessment: Performed By: Physician STONE III, HOYT E., PA-C Debridement: Debridement Pre-procedure Verification/Time Yes - 15:44 Out Taken: Start Time: 15:45 Pain Control: Lidocaine 4% Topical Solution Level: Skin/Subcutaneous Tissue Total Area Debrided (L x W): 2 (cm) x 0.8 (cm) = 1.6 (cm) Tissue and other material Viable, Non-Viable, Exudate, Fibrin/Slough, Subcutaneous debrided: Instrument: Curette Bleeding: Minimum Hemostasis Achieved: Pressure End Time: 15:47 Procedural Pain: 0 Post Procedural Pain: 0 Response to Treatment: Procedure was tolerated well Post Debridement Measurements of Total Wound Length: (cm) 2 Width: (cm) 0.8 Depth:  (cm) 0.2 Volume: (cm) 0.251 Character of Wound/Ulcer Post Debridement: Requires Further Debridement Post Procedure Diagnosis Same as Pre-procedure Electronic Signature(s) Signed: 12/22/2016 4:03:37 PM By: Alric Quan Signed: 12/22/2016 5:27:57 PM By: Worthy Keeler PA-C Entered By: Alric Quan on 12/22/2016 15:45:59 Kimberly [name] ([id number]) -------------------------------------------------------------------------------- Debridement Details Patient Name: Kimberly [name] Date of Service: 12/22/2016 2:45 PM Medical Record Number: [id number] Patient Account Number: [id number] Date of Birth/Sex: [birth year] (71 y.o. Female) Treating RN: Carolyne Fiscal, Debi Primary Care Provider: Cathlean Cower Other Clinician: Referring Provider: Cathlean Cower Treating Provider/Extender: Melburn Hake, HOYT Weeks in Treatment: 2 Debridement Performed for Wound #2 Right,Posterior Lower Leg Assessment: Performed By: Physician STONE III, HOYT E., PA-C Debridement: Debridement Pre-procedure Verification/Time Yes - 15:44 Out Taken: Start Time: 15:47 Pain Control: Lidocaine 4% Topical Solution Level: Skin/Subcutaneous Tissue Total Area Debrided (L x W): 1.7 (cm) x 0.8 (cm) = 1.36 (cm) Tissue and other material Viable, Non-Viable, Exudate, Fibrin/Slough, Subcutaneous debrided: Instrument: Curette Bleeding: Minimum Hemostasis Achieved: Pressure End Time: 15:49 Procedural Pain: 0 Post Procedural Pain: 0 Response to Treatment: Procedure was tolerated well Post Debridement Measurements of Total Wound Length: (cm) 1.7 Width: (cm) 0.8 Depth: (cm) 0.2 Volume: (cm) 0.214 Character of Wound/Ulcer Post Debridement: Requires Further Debridement Post Procedure Diagnosis Same as Pre-procedure Electronic Signature(s) Signed: 12/22/2016 4:03:37 PM By: Alric Quan Signed: 12/22/2016 5:27:57 PM By: Worthy Keeler PA-C Entered By: Alric Quan on 12/22/2016 15:46:53 Kimberly [name]  ([id number]) -------------------------------------------------------------------------------- HPI Details Patient Name: Kimberly [name] Date of Service: 12/22/2016 2:45 PM Medical Record Number: [id number] Patient Account Number: [id number] Date of Birth/Sex: 29-Nov-1945 (71 y.o. Female) Treating RN: Ahmed Prima Primary Care Provider: Cathlean Cower Other Clinician: Referring Provider: Cathlean Cower Treating Provider/Extender: Melburn Hake, HOYT Weeks in Treatment: 2 History of Present Illness Location: right lateral calf Quality: Patient reports experiencing a sharp pain to affected area(s). Severity: Patient states wound are getting worse. Duration: Patient has had the wound for > 3 months prior to seeking treatment at the wound  center Timing: Pain in wound is constant (hurts all the time) Context: The wound occurred when the patient had a injury to her leg with a sharp object which caused a laceration Modifying Factors: Other treatment(s) tried include:of hydrogen peroxide, Neosporin and antibiotics Associated Signs and Symptoms: Patient reports having increase swelling. HPI Description: 71 year old patient was seen by the nurse practitioner at the [facility] primary care for a wound which has been there on her right leg since August 2018. For a period of time she has been treated with doxycycline, clindamycin and Bactrim and the wound continues to have drainage and odor.on 11/20/2016 she was again put on Bactrim DS and referred to the wound center. Past medical history significant for hypertension, congestive heart failure, COPD, dyslipidemia, migraines, pericarditis, status post VSD repair, cholecystectomy tubal ligation, cesarean section, coronary angiogram,my tenotomy with tube placement on the right,dilated cardiomyopathy,tobacco abuse and has been a smoker for the last 35 years. She also has a history of right great saphenous vein ablation but the patient does not confirm this for sure. She  has had a venous duplex study done in August 2018 which showed no evidence of lower extremity deep or superficial venous thrombus or incompetence bilaterally. She has a arterial duplex study pending, which is going to be done later today. 12/22/16 on evaluation today patient appears to be doing fairly well in regard to her right lateral lower extremity wounds. She did undergo a right common femoral artery balloon angioplasty and stent placement which was performed on 12/17/16. Patient this is did appear to be a sufficient improvement according to the reviewed notes which is excellent news. Prior to this procedure patient did have a study of the lower extremity this was a Doppler performed on 12/08/16 this revealed moderate right lower extremity arterial disease involving the common femoral artery and iliac segment. There was also left arterial obstruction involving the iliac segment. On the left there was arterial obstruction involving the iliac segment, common femoral artery, and peroneal artery. Patient states she is still having some soreness left over and residual from the procedure. However overall she appears to be doing very well. She does have a follow-up study with vascular in the next week. Electronic Signature(s) Signed: 12/22/2016 5:27:57 PM By: Worthy Keeler PA-C Entered By: Worthy Keeler on 12/22/2016 16:57:43 Kimberly [name] ([id number]) -------------------------------------------------------------------------------- Physical Exam Details Patient Name: Kimberly [name] Date of Service: 12/22/2016 2:45 PM Medical Record Number: [id number] Patient Account Number: [id number] Date of Birth/Sex: [birth year] (71 y.o. Female) Treating RN: Ahmed Prima Primary Care Provider: Cathlean Cower Other Clinician: Referring Provider: Cathlean Cower Treating Provider/Extender: Melburn Hake, HOYT Weeks in Treatment: 2 Constitutional Well-nourished and well-hydrated in no acute  distress. Respiratory normal breathing without difficulty. clear to auscultation bilaterally. Cardiovascular regular rate and rhythm with normal S1, S2. Psychiatric this patient is able to make decisions and demonstrates good insight into disease process. Alert and Oriented x 3. pleasant and cooperative. Notes Have slough covering the wound bed of her lateral ankle wound which I was able to sharply debride with excellent results today. Patient did appear to have good blood flow. Electronic Signature(s) Signed: 12/22/2016 5:27:57 PM By: Worthy Keeler PA-C Entered By: Worthy Keeler on 12/22/2016 16:58:45 Kimberly [name] ([id number]) -------------------------------------------------------------------------------- Physician Orders Details Patient Name: Kimberly [name] Date of Service: 12/22/2016 2:45 PM Medical Record Number: [id number] Patient Account Number: [id number] Date of Birth/Sex: 1945/03/29 (71 y.o. Female) Treating RN: Ahmed Prima Primary Care Provider: Cathlean Cower Other  Clinician: Referring Provider: Cathlean Cower Treating Provider/Extender: Melburn Hake, HOYT Weeks in Treatment: 2 Verbal / Phone Orders: Yes Clinician: Pinkerton, Debi Read Back and Verified: Yes Diagnosis Coding ICD-10 Coding Code Description [phone] Non-pressure chronic ulcer of right calf with fat layer exposed I89.0 Lymphedema, not elsewhere classified I73.9 Peripheral vascular disease, unspecified F17.218 Nicotine dependence, cigarettes, with other nicotine-induced disorders Wound Cleansing Wound #1 Right,Lateral Lower Leg o Clean wound with Normal Saline. o Cleanse wound with mild soap and water o May Shower, gently pat wound dry prior to applying new dressing. Wound #2 Right,Posterior Lower Leg o Clean wound with Normal Saline. o Cleanse wound with mild soap and water o May Shower, gently pat wound dry prior to applying new dressing. Anesthetic Wound #1 Right,Lateral Lower Leg o  Topical Lidocaine 4% cream applied to wound bed prior to debridement Wound #2 Right,Posterior Lower Leg o Topical Lidocaine 4% cream applied to wound bed prior to debridement Primary Wound Dressing Wound #1 Right,Lateral Lower Leg o Santyl Ointment Wound #2 Right,Posterior Lower Leg o Santyl Ointment Secondary Dressing Wound #1 Right,Lateral Lower Leg o Conform/Kerlix o Non-adherent pad Wound #2 Right,Posterior Lower Leg o Conform/Kerlix o Non-adherent pad Dressing Change Frequency DANALI, MARINOS B. ([id number]) Wound #1 Right,Lateral Lower Leg o Change dressing every day. Wound #2 Right,Posterior Lower Leg o Change dressing every day. Follow-up Appointments Wound #1 Right,Lateral Lower Leg o Return Appointment in 2 weeks. Wound #2 Right,Posterior Lower Leg o Return Appointment in 2 weeks. Edema Control Wound #1 Right,Lateral Lower Leg o Elevate legs to the level of the heart and pump ankles as often as possible Wound #2 Right,Posterior Lower Leg o Elevate legs to the level of the heart and pump ankles as often as possible Additional Orders / Instructions Wound #1 Right,Lateral Lower Leg o Stop Smoking o Increase protein intake. Wound #2 Right,Posterior Lower Leg o Stop Smoking o Increase protein intake. Medications-please add to medication list. Wound #1 Right,Lateral Lower Leg o Santyl Enzymatic Ointment o Other: - Vitamin C, Zinc, MVI Wound #2 Right,Posterior Lower Leg o Santyl Enzymatic Ointment o Other: - Vitamin C, Zinc, MVI Electronic Signature(s) Signed: 12/22/2016 4:03:37 PM By: Alric Quan Signed: 12/22/2016 5:27:57 PM By: Worthy Keeler PA-C Entered By: Alric Quan on 12/22/2016 15:51:44 Kimberly [name] ([id number]) -------------------------------------------------------------------------------- Problem List Details Patient Name: Kimberly [name] Date of Service: 12/22/2016 2:45 PM Medical Record Number:  [id number] Patient Account Number: [id number] Date of Birth/Sex: 1945/09/17 (71 y.o. Female) Treating RN: Ahmed Prima Primary Care Provider: Cathlean Cower Other Clinician: Referring Provider: Cathlean Cower Treating Provider/Extender: Melburn Hake, HOYT Weeks in Treatment: 2 Active Problems ICD-10 Encounter Code Description Active Date Diagnosis L97.212 Non-pressure chronic ulcer of right calf with fat layer exposed 12/05/2016 Yes I89.0 Lymphedema, not elsewhere classified 12/05/2016 Yes I73.9 Peripheral vascular disease, unspecified 12/05/2016 Yes F17.218 Nicotine dependence, cigarettes, with other nicotine-induced 12/05/2016 Yes disorders Inactive Problems Resolved Problems Electronic Signature(s) Signed: 12/22/2016 5:27:57 PM By: Worthy Keeler PA-C Entered By: Worthy Keeler on 12/22/2016 15:38:00 Kimberly [name] ([id number]) -------------------------------------------------------------------------------- Progress Note Details Patient Name: Kimberly [name] Date of Service: 12/22/2016 2:45 PM Medical Record Number: [id number] Patient Account Number: [id number] Date of Birth/Sex: 20-Dec-1945 (71 y.o. Female) Treating RN: Ahmed Prima Primary Care Provider: Cathlean Cower Other Clinician: Referring Provider: Cathlean Cower Treating Provider/Extender: Melburn Hake, HOYT Weeks in Treatment: 2 Subjective Chief Complaint Information obtained from Patient Patient seen for complaints of Non-Healing Wound to the right lateral calf History of Present Illness (HPI) The following  HPI elements were documented for the patient's wound: Location: right lateral calf Quality: Patient reports experiencing a sharp pain to affected area(s). Severity: Patient states wound are getting worse. Duration: Patient has had the wound for > 3 months prior to seeking treatment at the wound center Timing: Pain in wound is constant (hurts all the time) Context: The wound occurred when the patient had a injury to her leg  with a sharp object which caused a laceration Modifying Factors: Other treatment(s) tried include:of hydrogen peroxide, Neosporin and antibiotics Associated Signs and Symptoms: Patient reports having increase swelling. 71 year old patient was seen by the nurse practitioner at the [facility] primary care for a wound which has been there on her right leg since August 2018. For a period of time she has been treated with doxycycline, clindamycin and Bactrim and the wound continues to have drainage and odor.on 11/20/2016 she was again put on Bactrim DS and referred to the wound center. Past medical history significant for hypertension, congestive heart failure, COPD, dyslipidemia, migraines, pericarditis, status post VSD repair, cholecystectomy tubal ligation, cesarean section, coronary angiogram,my tenotomy with tube placement on the right,dilated cardiomyopathy,tobacco abuse and has been a smoker for the last 35 years. She also has a history of right great saphenous vein ablation but the patient does not confirm this for sure. She has had a venous duplex study done in August 2018 which showed no evidence of lower extremity deep or superficial venous thrombus or incompetence bilaterally. She has a arterial duplex study pending, which is going to be done later today. 12/22/16 on evaluation today patient appears to be doing fairly well in regard to her right lateral lower extremity wounds. She did undergo a right common femoral artery balloon angioplasty and stent placement which was performed on 12/17/16. Patient this is did appear to be a sufficient improvement according to the reviewed notes which is excellent news. Prior to this procedure patient did have a study of the lower extremity this was a Doppler performed on 12/08/16 this revealed moderate right lower extremity arterial disease involving the common femoral artery and iliac segment. There was also left arterial obstruction involving the iliac  segment. On the left there was arterial obstruction involving the iliac segment, common femoral artery, and peroneal artery. Patient states she is still having some soreness left over and residual from the procedure. However overall she appears to be doing very well. She does have a follow-up study with vascular in the next week. Patient History Information obtained from Patient. Family History Cancer - Father, Heart Disease - Siblings, Hypertension - Mother, Tuberculosis - Father, No family history of Diabetes, Hereditary Spherocytosis, Kidney Disease, Lung Disease, Seizures, Stroke, Thyroid Problems. Social History MAHEALANI, SULAK ([id number]) Current every day smoker - 2-3 day, Marital Status - Married, Alcohol Use - Never, Drug Use - No History, Caffeine Use - Daily. Review of Systems (ROS) Constitutional Symptoms (General Health) Denies complaints or symptoms of Fever, Chills. Respiratory The patient has no complaints or symptoms. Cardiovascular The patient has no complaints or symptoms. Psychiatric The patient has no complaints or symptoms. Objective Constitutional Well-nourished and well-hydrated in no acute distress. Vitals Time Taken: 3:09 PM, Height: 61 in, Weight: 111.9 lbs, BMI: 21.1, Temperature: 98.6 F, Pulse: 86 bpm, Respiratory Rate: 20 breaths/min, Blood Pressure: 96/80 mmHg. Respiratory normal breathing without difficulty. clear to auscultation bilaterally. Cardiovascular regular rate and rhythm with normal S1, S2. Psychiatric this patient is able to make decisions and demonstrates good insight into disease process. Alert and  Oriented x 3. pleasant and cooperative. General Notes: Have slough covering the wound bed of her lateral ankle wound which I was able to sharply debride with excellent results today. Patient did appear to have good blood flow. Integumentary (Hair, Skin) Wound #1 status is Open. Original cause of wound was Trauma. The wound is located on the  Right,Lateral Lower Leg. The wound measures 2cm length x 0.8cm width x 0.1cm depth; 1.257cm^2 area and 0.126cm^3 volume. There is no tunneling or undermining noted. There is a large amount of serous drainage noted. The wound margin is distinct with the outline attached to the wound base. There is no granulation within the wound bed. There is a large (67-100%) amount of necrotic tissue within the wound bed including Adherent Slough. The periwound skin appearance exhibited: Maceration. Periwound temperature was noted as No Abnormality. The periwound has tenderness on palpation. Wound #2 status is Open. Original cause of wound was Trauma. The wound is located on the Right,Posterior Lower Leg. The wound measures 1.7cm length x 0.8cm width x 0.1cm depth; 1.068cm^2 area and 0.107cm^3 volume. There is no tunneling or undermining noted. There is a large amount of serous drainage noted. The wound margin is distinct with the outline attached to the wound base. There is no granulation within the wound bed. There is a large (67-100%) amount of necrotic tissue within the wound bed including Adherent Slough. Periwound temperature was noted as No Abnormality. The periwound has tenderness on palpation. MIYO, AINA ([id number]) Assessment Active Problems ICD-10 [id number] - Non-pressure chronic ulcer of right calf with fat layer exposed I89.0 - Lymphedema, not elsewhere classified I73.9 - Peripheral vascular disease, unspecified F17.218 - Nicotine dependence, cigarettes, with other nicotine-induced disorders Procedures Wound #1 Pre-procedure diagnosis of Wound #1 is a Trauma, Other located on the Right,Lateral Lower Leg . There was a Skin/Subcutaneous Tissue Debridement (52778-24235) debridement with total area of 1.6 sq cm performed by STONE III, HOYT E., PA-C. with the following instrument(s): Curette to remove Viable and Non-Viable tissue/material including Exudate, Fibrin/Slough, and Subcutaneous after  achieving pain control using Lidocaine 4% Topical Solution. A time out was conducted at 15:44, prior to the start of the procedure. A Minimum amount of bleeding was controlled with Pressure. The procedure was tolerated well with a pain level of 0 throughout and a pain level of 0 following the procedure. Post Debridement Measurements: 2cm length x 0.8cm width x 0.2cm depth; 0.251cm^3 volume. Character of Wound/Ulcer Post Debridement requires further debridement. Post procedure Diagnosis Wound #1: Same as Pre-Procedure Wound #2 Pre-procedure diagnosis of Wound #2 is a Trauma, Other located on the Right,Posterior Lower Leg . There was a Skin/Subcutaneous Tissue Debridement ([id number]) debridement with total area of 1.36 sq cm performed by STONE III, HOYT E., PA-C. with the following instrument(s): Curette to remove Viable and Non-Viable tissue/material including Exudate, Fibrin/Slough, and Subcutaneous after achieving pain control using Lidocaine 4% Topical Solution. A time out was conducted at 15:44, prior to the start of the procedure. A Minimum amount of bleeding was controlled with Pressure. The procedure was tolerated well with a pain level of 0 throughout and a pain level of 0 following the procedure. Post Debridement Measurements: 1.7cm length x 0.8cm width x 0.2cm depth; 0.214cm^3 volume. Character of Wound/Ulcer Post Debridement requires further debridement. Post procedure Diagnosis Wound #2: Same as Pre-Procedure Plan Wound Cleansing: Wound #1 Right,Lateral Lower Leg: Clean wound with Normal Saline. Cleanse wound with mild soap and water May Shower, gently pat wound dry prior to applying  new dressing. Wound #2 Right,Posterior Lower Leg: Clean wound with Normal Saline. Cleanse wound with mild soap and water May Shower, gently pat wound dry prior to applying new dressing. LEOTA, MAKA ([id number]) Anesthetic: Wound #1 Right,Lateral Lower Leg: Topical Lidocaine 4% cream applied  to wound bed prior to debridement Wound #2 Right,Posterior Lower Leg: Topical Lidocaine 4% cream applied to wound bed prior to debridement Primary Wound Dressing: Wound #1 Right,Lateral Lower Leg: Santyl Ointment Wound #2 Right,Posterior Lower Leg: Santyl Ointment Secondary Dressing: Wound #1 Right,Lateral Lower Leg: Conform/Kerlix Non-adherent pad Wound #2 Right,Posterior Lower Leg: Conform/Kerlix Non-adherent pad Dressing Change Frequency: Wound #1 Right,Lateral Lower Leg: Change dressing every day. Wound #2 Right,Posterior Lower Leg: Change dressing every day. Follow-up Appointments: Wound #1 Right,Lateral Lower Leg: Return Appointment in 2 weeks. Wound #2 Right,Posterior Lower Leg: Return Appointment in 2 weeks. Edema Control: Wound #1 Right,Lateral Lower Leg: Elevate legs to the level of the heart and pump ankles as often as possible Wound #2 Right,Posterior Lower Leg: Elevate legs to the level of the heart and pump ankles as often as possible Additional Orders / Instructions: Wound #1 Right,Lateral Lower Leg: Stop Smoking Increase protein intake. Wound #2 Right,Posterior Lower Leg: Stop Smoking Increase protein intake. Medications-please add to medication list.: Wound #1 Right,Lateral Lower Leg: Santyl Enzymatic Ointment Other: - Vitamin C, Zinc, MVI Wound #2 Right,Posterior Lower Leg: Santyl Enzymatic Ointment Other: - Vitamin C, Zinc, MVI I am going to recommend at this point that we continue with the Santyl for one more week although I'm hopeful that she may not need to Santyl be on this. We will see were things stand in one weeks time. If anything worsens is significantly in the interim patient will contact our office for additional recommendations. Otherwise please see above for specific wound care orders. CANDISE, CRABTREE ([id number]) Electronic Signature(s) Signed: 12/22/2016 5:27:57 PM By: Worthy Keeler PA-C Entered By: Worthy Keeler on 12/22/2016  16:59:21 Kimberly [name] ([id number]) -------------------------------------------------------------------------------- ROS/PFSH Details Patient Name: Kimberly [name] Date of Service: 12/22/2016 2:45 PM Medical Record Number: [id number] Patient Account Number: [id number] Date of Birth/Sex: 1945/03/01 (71 y.o. Female) Treating RN: Carolyne Fiscal, Debi Primary Care Provider: Cathlean Cower Other Clinician: Referring Provider: Cathlean Cower Treating Provider/Extender: Melburn Hake, HOYT Weeks in Treatment: 2 Information Obtained From Patient Wound History Do you currently have one or more open woundso Yes How many open wounds do you currently haveo 1 Approximately how long have you had your woundso aug 2018 How have you been treating your wound(s) until nowo hydrogel ag Has your wound(s) ever healed and then re-openedo No Have you had any lab work done in the past montho No Have you tested positive for an antibiotic resistant organism (MRSA, VRE)o No Have you tested positive for osteomyelitis (bone infection)o No Have you had any tests for circulation on your legso No Have you had other problems associated with your woundso Swelling Constitutional Symptoms (General Health) Complaints and Symptoms: Negative for: Fever; Chills Hematologic/Lymphatic Medical History: Positive for: Anemia - hx Respiratory Complaints and Symptoms: No Complaints or Symptoms Medical History: Positive for: Asthma; Chronic Obstructive Pulmonary Disease (COPD) Cardiovascular Complaints and Symptoms: No Complaints or Symptoms Medical History: Positive for: Congestive Heart Failure Psychiatric Complaints and Symptoms: No Complaints or Symptoms Immunizations Pneumococcal Vaccine: Received Pneumococcal Vaccination: Yes KEAIRRA, BARDON ([id number]) Implantable Devices Family and Social History Cancer: Yes - Father; Diabetes: No; Heart Disease: Yes - Siblings; Hereditary Spherocytosis: No; Hypertension: Yes - Mother;  Kidney Disease: No;  Lung Disease: No; Seizures: No; Stroke: No; Thyroid Problems: No; Tuberculosis: Yes - Father; Current every day smoker - 2-3 day; Marital Status - Married; Alcohol Use: Never; Drug Use: No History; Caffeine Use: Daily; Financial Concerns: No; Food, Clothing or Shelter Needs: No; Support System Lacking: No; Transportation Concerns: No; Advanced Directives: No; Patient does not want information on Advanced Directives; Do not resuscitate: No; Living Will: No; Medical Power of Attorney: No Physician Affirmation I have reviewed and agree with the above information. Electronic Signature(s) Signed: 12/22/2016 5:27:57 PM By: Worthy Keeler PA-C Signed: 12/24/2016 5:20:26 PM By: Alric Quan Entered By: Worthy Keeler on 12/22/2016 16:58:08 Kimberly [name] ([id number]) -------------------------------------------------------------------------------- SuperBill Details Patient Name: Kimberly [name] Date of Service: 12/22/2016 Medical Record Number: [id number] Patient Account Number: [id number] Date of Birth/Sex: August 28, 1945 (71 y.o. Female) Treating RN: Carolyne Fiscal, Debi Primary Care Provider: Cathlean Cower Other Clinician: Referring Provider: Cathlean Cower Treating Provider/Extender: Melburn Hake, HOYT Weeks in Treatment: 2 Diagnosis Coding ICD-10 Codes Code Description [phone] Non-pressure chronic ulcer of right calf with fat layer exposed I89.0 Lymphedema, not elsewhere classified I73.9 Peripheral vascular disease, unspecified F17.218 Nicotine dependence, cigarettes, with other nicotine-induced disorders Facility Procedures CPT4 Code: [id number] Description: 30076 - DEB SUBQ TISSUE 20 SQ CM/< ICD-10 Diagnosis Description L97.212 Non-pressure chronic ulcer of right calf with fat layer exp Modifier: osed Quantity: 1 Physician Procedures CPT4 Code: [id number] Description: 45625 - WC PHYS LEVEL 3 - EST PT ICD-10 Diagnosis Description L97.212 Non-pressure chronic ulcer of right calf with  fat layer expos I89.0 Lymphedema, not elsewhere classified I73.9 Peripheral vascular disease, unspecified F17.218 Nicotine  dependence, cigarettes, with other nicotine-induced Modifier: 25 ed disorders Quantity: 1 CPT4 Code: [id number] Description: 42876 - WC PHYS SUBQ TISS 20 SQ CM ICD-10 Diagnosis Description L97.212 Non-pressure chronic ulcer of right calf with fat layer expos Modifier: ed Quantity: 1 Electronic Signature(s) Signed: 12/22/2016 5:27:57 PM By: Worthy Keeler PA-C Entered By: Worthy Keeler on 12/22/2016 17:00:51

## 2016-12-29 ENCOUNTER — Ambulatory Visit: Payer: Medicare Other | Admitting: Surgery

## 2016-12-31 ENCOUNTER — Ambulatory Visit (INDEPENDENT_AMBULATORY_CARE_PROVIDER_SITE_OTHER): Payer: Medicare Other

## 2016-12-31 DIAGNOSIS — Z9862 Peripheral vascular angioplasty status: Secondary | ICD-10-CM

## 2017-01-01 LAB — VAS US LOWER EXTREMITY ARTERIAL DUPLEX
RPOPPPSV: 76 cm/s
Right super femoral dist sys PSV: -130 cm/s
Right super femoral mid sys PSV: -110 cm/s
Right super femoral prox sys PSV: -118 cm/s

## 2017-01-02 ENCOUNTER — Encounter: Payer: Medicare Other | Admitting: Surgery

## 2017-01-02 ENCOUNTER — Ambulatory Visit: Payer: Medicare Other | Admitting: Cardiovascular Disease

## 2017-01-02 DIAGNOSIS — J449 Chronic obstructive pulmonary disease, unspecified: Secondary | ICD-10-CM | POA: Diagnosis not present

## 2017-01-02 DIAGNOSIS — E785 Hyperlipidemia, unspecified: Secondary | ICD-10-CM | POA: Diagnosis not present

## 2017-01-02 DIAGNOSIS — S81801A Unspecified open wound, right lower leg, initial encounter: Secondary | ICD-10-CM | POA: Diagnosis not present

## 2017-01-02 DIAGNOSIS — I110 Hypertensive heart disease with heart failure: Secondary | ICD-10-CM | POA: Diagnosis not present

## 2017-01-02 DIAGNOSIS — I890 Lymphedema, not elsewhere classified: Secondary | ICD-10-CM | POA: Diagnosis not present

## 2017-01-02 DIAGNOSIS — I509 Heart failure, unspecified: Secondary | ICD-10-CM | POA: Diagnosis not present

## 2017-01-02 DIAGNOSIS — L97212 Non-pressure chronic ulcer of right calf with fat layer exposed: Secondary | ICD-10-CM | POA: Diagnosis not present

## 2017-01-02 DIAGNOSIS — S81811A Laceration without foreign body, right lower leg, initial encounter: Secondary | ICD-10-CM | POA: Diagnosis not present

## 2017-01-02 DIAGNOSIS — I739 Peripheral vascular disease, unspecified: Secondary | ICD-10-CM | POA: Diagnosis not present

## 2017-01-02 DIAGNOSIS — G43909 Migraine, unspecified, not intractable, without status migrainosus: Secondary | ICD-10-CM | POA: Diagnosis not present

## 2017-01-03 NOTE — Progress Notes (Addendum)
Kimberly [name] ([id number]) Visit Report for 01/02/2017 Chief Complaint Document Details Patient Name: Kimberly [name] Date of Service: 01/02/2017 11:00 AM Medical Record Number: [id number] Patient Account Number: [id number] Date of Birth/Sex: 22-Jul-1945 (71 y.o. Female) Treating RN: Roger Shelter Primary Care Provider: Cathlean Cower Other Clinician: Referring Provider: Cathlean Cower Treating Provider/Extender: Frann Rider in Treatment: 4 Information Obtained from: Patient Chief Complaint Patient seen for complaints of Non-Healing Wound to the right lateral calf Electronic Signature(s) Signed: 01/02/2017 11:58:27 AM By: Christin Fudge MD, FACS Entered By: Christin Fudge on 01/02/2017 11:58:27 Kimberly [name] ([id number]) -------------------------------------------------------------------------------- HPI Details Patient Name: Kimberly [name] Date of Service: 01/02/2017 11:00 AM Medical Record Number: [id number] Patient Account Number: [id number] Date of Birth/Sex: 06-21-1945 (71 y.o. Female) Treating RN: Roger Shelter Primary Care Provider: Cathlean Cower Other Clinician: Referring Provider: Cathlean Cower Treating Provider/Extender: Frann Rider in Treatment: 4 History of Present Illness Location: right lateral calf Quality: Patient reports experiencing a sharp pain to affected area(s). Severity: Patient states wound are getting worse. Duration: Patient has had the wound for > 3 months prior to seeking treatment at the wound center Timing: Pain in wound is constant (hurts all the time) Context: The wound occurred when the patient had a injury to her leg with a sharp object which caused a laceration Modifying Factors: Other treatment(s) tried include:of hydrogen peroxide, Neosporin and antibiotics Associated Signs and Symptoms: Patient reports having increase swelling. HPI Description: 71 year old patient was seen by the nurse practitioner at the [facility] primary care for a  wound which has been there on her right leg since August 2018. For a period of time she has been treated with doxycycline, clindamycin and Bactrim and the wound continues to have drainage and odor.on 11/20/2016 she was again put on Bactrim DS and referred to the wound center. Past medical history significant for hypertension, congestive heart failure, COPD, dyslipidemia, migraines, pericarditis, status post VSD repair, cholecystectomy tubal ligation, cesarean section, coronary angiogram,my tenotomy with tube placement on the right,dilated cardiomyopathy,tobacco abuse and has been a smoker for the last 35 years. She also has a history of right great saphenous vein ablation but the patient does not confirm this for sure. She has had a venous duplex study done in August 2018 which showed no evidence of lower extremity deep or superficial venous thrombus or incompetence bilaterally. She has a arterial duplex study pending, which is going to be done later today. 12/22/16 on evaluation today patient appears to be doing fairly well in regard to her right lateral lower extremity wounds. She did undergo a right common femoral artery balloon angioplasty and stent placement which was performed on 12/17/16. Patient this is did appear to be a sufficient improvement according to the reviewed notes which is excellent news. Prior to this procedure patient did have a study of the lower extremity this was a Doppler performed on 12/08/16 this revealed moderate right lower extremity arterial disease involving the common femoral artery and iliac segment. There was also left arterial obstruction involving the iliac segment. On the left there was arterial obstruction involving the iliac segment, common femoral artery, and peroneal artery. Patient states she is still having some soreness left over and residual from the procedure. However overall she appears to be doing very well. She does have a follow-up study with  vascular in the next week. 01/02/2017 -- was seen by Dr. Jacqulyn Cane -- she had an abdominal aortogram with lower extremity peripheral vascular intervention, and this showed moderate left  common iliac artery stenosis. Short occlusion of the right common femoral artery with no significant infrainguinal disease. Short occlusion of the left common femoral artery with no significant infrainguinal disease and a successful drug-coated balloon angioplasty stent was placed in the right common femoral artery. She had a lower extremity arterial duplex examination done which showed the resting right ABI is within normal range and no evidence of significant right lower extremity arterial disease. right ABI is 1.08 with a toe pressure being 0.54. there was biphasic flow. The left ABI was 0.77 and the toe and this was 0.38 with monophasic flow.The right toe brachial indicis were abnormal. The left resting ABI indicates moderate left lower extremity arterial disease in the left toe brachial index is abnormal. Electronic Signature(s) Signed: 01/02/2017 11:58:39 AM By: Christin Fudge MD, FACS Previous Signature: 01/02/2017 11:12:51 AM Version By: Christin Fudge MD, FACS Previous Signature: 01/02/2017 11:09:49 AM Version By: Christin Fudge MD, FACS Entered By: Christin Fudge on 01/02/2017 11:58:39 Kimberly [name] ([id number]) Kimberly [name], Kimberly [name] ([id number]) -------------------------------------------------------------------------------- Physical Exam Details Patient Name: Kimberly [name] Date of Service: 01/02/2017 11:00 AM Medical Record Number: [id number] Patient Account Number: [id number] Date of Birth/Sex: 04/16/1945 (71 y.o. Female) Treating RN: Roger Shelter Primary Care Provider: Cathlean Cower Other Clinician: Referring Provider: Cathlean Cower Treating Provider/Extender: Frann Rider in Treatment: 4 Constitutional . Pulse regular. Respirations normal and unlabored. Afebrile. . Eyes Nonicteric.  Reactive to light. Ears, Nose, Mouth, and Throat Lips, teeth, and gums WNL.Marland Kitchen Moist mucosa without lesions. Neck supple and nontender. No palpable supraclavicular or cervical adenopathy. Normal sized without goiter. Respiratory WNL. No retractions.. Cardiovascular Pedal Pulses WNL. No clubbing, cyanosis or edema. Lymphatic No adneopathy. No adenopathy. No adenopathy. Musculoskeletal Adexa without tenderness or enlargement.. Digits and nails w/o clubbing, cyanosis, infection, petechiae, ischemia, or inflammatory conditions.. Integumentary (Hair, Skin) No suspicious lesions. No crepitus or fluctuance. No peri-wound warmth or erythema. No masses.Marland Kitchen Psychiatric Judgement and insight Intact.. No evidence of depression, anxiety, or agitation.. Notes The patient has 2 lacerated wounds on the right lateral calf which are covered with slough and the extremely tender to touch. No sharp debridement was attempted.I washed out with moist saline gauze and remove some of the debris Electronic Signature(s) Signed: 01/02/2017 12:00:08 PM By: Christin Fudge MD, FACS Entered By: Christin Fudge on 01/02/2017 12:00:07 Kimberly [name] ([id number]) -------------------------------------------------------------------------------- Physician Orders Details Patient Name: Kimberly [name] Date of Service: 01/02/2017 11:00 AM Medical Record Number: [id number] Patient Account Number: [id number] Date of Birth/Sex: [birth year] (71 y.o. Female) Treating RN: Roger Shelter Primary Care Provider: Cathlean Cower Other Clinician: Referring Provider: Cathlean Cower Treating Provider/Extender: Frann Rider in Treatment: 4 Verbal / Phone Orders: No Diagnosis Coding Wound Cleansing Wound #1 Right,Lateral Lower Leg o Clean wound with Normal Saline. o Cleanse wound with mild soap and water o May Shower, gently pat wound dry prior to applying new dressing. Wound #2 Right,Posterior Lower Leg o Clean wound with Normal  Saline. o Cleanse wound with mild soap and water o May Shower, gently pat wound dry prior to applying new dressing. Anesthetic (add to Medication List) Wound #1 Right,Lateral Lower Leg o Topical Lidocaine 4% cream applied to wound bed prior to debridement (In Clinic Only). Wound #2 Right,Posterior Lower Leg o Topical Lidocaine 4% cream applied to wound bed prior to debridement (In Clinic Only). Primary Wound Dressing Wound #1 Right,Lateral Lower Leg o Santyl Ointment - FINISH REMAINDER OF SANTYL AT HOME THEN CHANGE TO SILVER CELL ON WOUND Wound #2 Right,Posterior  Lower Leg o Santyl Ointment - FINISH REMAINDER OF SANTYL AT HOME THEN CHANGE TO SILVERCELL Secondary Dressing Wound #1 Right,Lateral Lower Leg o Conform/Kerlix o Non-adherent pad Wound #2 Right,Posterior Lower Leg o Conform/Kerlix o Non-adherent pad Dressing Change Frequency Wound #1 Right,Lateral Lower Leg o Change dressing every day. Wound #2 Right,Posterior Lower Leg o Change dressing every day. Edema Control Wound #1 Right,Lateral Lower Leg Kimberly [name], Kimberly [name]. ([id number]) o Elevate legs to the level of the heart and pump ankles as often as possible Wound #2 Right,Posterior Lower Leg o Elevate legs to the level of the heart and pump ankles as often as possible Additional Orders / Instructions Wound #1 Right,Lateral Lower Leg o Stop Smoking o Increase protein intake. Wound #2 Right,Posterior Lower Leg o Stop Smoking o Increase protein intake. Medications-please add to medication list. Wound #1 Right,Lateral Lower Leg o Santyl Enzymatic Ointment o Other: - Vitamin C, Zinc, MVI Wound #2 Right,Posterior Lower Leg o Santyl Enzymatic Ointment o Other: - Vitamin C, Zinc, MVI Electronic Signature(s) Signed: 01/02/2017 12:55:29 PM By: Christin Fudge MD, FACS Signed: 01/02/2017 4:46:19 PM By: Roger Shelter Entered By: Roger Shelter on 01/02/2017 11:55:33 Kimberly [name]  ([id number]) -------------------------------------------------------------------------------- Problem List Details Patient Name: Kimberly [name] Date of Service: 01/02/2017 11:00 AM Medical Record Number: [id number] Patient Account Number: [id number] Date of Birth/Sex: 1945-08-02 (71 y.o. Female) Treating RN: Roger Shelter Primary Care Provider: Cathlean Cower Other Clinician: Referring Provider: Cathlean Cower Treating Provider/Extender: Frann Rider in Treatment: 4 Active Problems ICD-10 Encounter Code Description Active Date Diagnosis L97.212 Non-pressure chronic ulcer of right calf with fat layer exposed 12/05/2016 Yes I89.0 Lymphedema, not elsewhere classified 12/05/2016 Yes I73.9 Peripheral vascular disease, unspecified 12/05/2016 Yes F17.218 Nicotine dependence, cigarettes, with other nicotine-induced 12/05/2016 Yes disorders Inactive Problems Resolved Problems Electronic Signature(s) Signed: 01/02/2017 11:58:13 AM By: Christin Fudge MD, FACS Entered By: Christin Fudge on 01/02/2017 11:58:13 Kimberly [name] ([id number]) -------------------------------------------------------------------------------- Progress Note Details Patient Name: Kimberly [name] Date of Service: 01/02/2017 11:00 AM Medical Record Number: [id number] Patient Account Number: [id number] Date of Birth/Sex: [birth year] (71 y.o. Female) Treating RN: Roger Shelter Primary Care Provider: Cathlean Cower Other Clinician: Referring Provider: Cathlean Cower Treating Provider/Extender: Frann Rider in Treatment: 4 Subjective Chief Complaint Information obtained from Patient Patient seen for complaints of Non-Healing Wound to the right lateral calf History of Present Illness (HPI) The following HPI elements were documented for the patient's wound: Location: right lateral calf Quality: Patient reports experiencing a sharp pain to affected area(s). Severity: Patient states wound are getting worse. Duration:  Patient has had the wound for > 3 months prior to seeking treatment at the wound center Timing: Pain in wound is constant (hurts all the time) Context: The wound occurred when the patient had a injury to her leg with a sharp object which caused a laceration Modifying Factors: Other treatment(s) tried include:of hydrogen peroxide, Neosporin and antibiotics Associated Signs and Symptoms: Patient reports having increase swelling. 71 year old patient was seen by the nurse practitioner at the [facility] primary care for a wound which has been there on her right leg since August 2018. For a period of time she has been treated with doxycycline, clindamycin and Bactrim and the wound continues to have drainage and odor.on 11/20/2016 she was again put on Bactrim DS and referred to the wound center. Past medical history significant for hypertension, congestive heart failure, COPD, dyslipidemia, migraines, pericarditis, status post VSD repair, cholecystectomy tubal ligation, cesarean section, coronary angiogram,my tenotomy with tube placement on  the right,dilated cardiomyopathy,tobacco abuse and has been a smoker for the last 35 years. She also has a history of right great saphenous vein ablation but the patient does not confirm this for sure. She has had a venous duplex study done in August 2018 which showed no evidence of lower extremity deep or superficial venous thrombus or incompetence bilaterally. She has a arterial duplex study pending, which is going to be done later today. 12/22/16 on evaluation today patient appears to be doing fairly well in regard to her right lateral lower extremity wounds. She did undergo a right common femoral artery balloon angioplasty and stent placement which was performed on 12/17/16. Patient this is did appear to be a sufficient improvement according to the reviewed notes which is excellent news. Prior to this procedure patient did have a study of the lower extremity this was  a Doppler performed on 12/08/16 this revealed moderate right lower extremity arterial disease involving the common femoral artery and iliac segment. There was also left arterial obstruction involving the iliac segment. On the left there was arterial obstruction involving the iliac segment, common femoral artery, and peroneal artery. Patient states she is still having some soreness left over and residual from the procedure. However overall she appears to be doing very well. She does have a follow-up study with vascular in the next week. 01/02/2017 -- was seen by Dr. Jacqulyn Cane -- she had an abdominal aortogram with lower extremity peripheral vascular intervention, and this showed moderate left common iliac artery stenosis. Short occlusion of the right common femoral artery with no significant infrainguinal disease. Short occlusion of the left common femoral artery with no significant infrainguinal disease and a successful drug-coated balloon angioplasty stent was placed in the right common femoral artery. She had a lower extremity arterial duplex examination done which showed the resting right ABI is within normal range and no evidence of significant right lower extremity arterial disease. right ABI is 1.08 with a toe pressure being 0.54. there was biphasic flow. The left ABI was 0.77 and the toe and this was 0.38 with monophasic flow.The right toe brachial indicis were abnormal. The left resting ABI indicates moderate left lower extremity arterial disease in the left toe brachial index is Kimberly [name], Kimberly [name]. ([id number]) abnormal. Patient History Information obtained from Patient. Family History Cancer - Father, Heart Disease - Siblings, Hypertension - Mother, Tuberculosis - Father, No family history of Diabetes, Hereditary Spherocytosis, Kidney Disease, Lung Disease, Seizures, Stroke, Thyroid Problems. Social History Current every day smoker - 2-3 day, Marital Status - Married, Alcohol Use -  Never, Drug Use - No History, Caffeine Use - Daily. Objective Constitutional Pulse regular. Respirations normal and unlabored. Afebrile. Vitals Time Taken: 11:13 AM, Height: 61 in, Weight: 111.9 lbs, BMI: 21.1, Temperature: 99.1 F, Pulse: 73 bpm, Respiratory Rate: 20 breaths/min, Blood Pressure: 101/58 mmHg. Eyes Nonicteric. Reactive to light. Ears, Nose, Mouth, and Throat Lips, teeth, and gums WNL.Marland Kitchen Moist mucosa without lesions. Neck supple and nontender. No palpable supraclavicular or cervical adenopathy. Normal sized without goiter. Respiratory WNL. No retractions.. Cardiovascular Pedal Pulses WNL. No clubbing, cyanosis or edema. Lymphatic No adneopathy. No adenopathy. No adenopathy. Musculoskeletal Adexa without tenderness or enlargement.. Digits and nails w/o clubbing, cyanosis, infection, petechiae, ischemia, or inflammatory conditions.Marland Kitchen Psychiatric Judgement and insight Intact.. No evidence of depression, anxiety, or agitation.. General Notes: The patient has 2 lacerated wounds on the right lateral calf which are covered with slough and the extremely Kimberly [name], Kimberly [name]. ([id number]) tender to touch. No  sharp debridement was attempted.I washed out with moist saline gauze and remove some of the debris Integumentary (Hair, Skin) No suspicious lesions. No crepitus or fluctuance. No peri-wound warmth or erythema. No masses.. Wound #1 status is Open. Original cause of wound was Trauma. The wound is located on the Right,Lateral Lower Leg. The wound measures 1.5cm length x 0.6cm width x 0.1cm depth; 0.707cm^2 area and 0.071cm^3 volume. There is Fat Layer (Subcutaneous Tissue) Exposed exposed. There is no tunneling or undermining noted. There is a large amount of serous drainage noted. The wound margin is distinct with the outline attached to the wound base. There is small (1-33%) pink granulation within the wound bed. There is a large (67-100%) amount of necrotic tissue within the wound  bed including Adherent Slough. The periwound skin appearance exhibited: Excoriation, Induration, Maceration. The periwound skin appearance did not exhibit: Callus, Crepitus, Rash, Scarring, Dry/Scaly, Atrophie Blanche, Cyanosis, Ecchymosis, Hemosiderin Staining, Mottled, Pallor, Rubor, Erythema. Periwound temperature was noted as No Abnormality. The periwound has tenderness on palpation. Wound #2 status is Open. Original cause of wound was Trauma. The wound is located on the Right,Posterior Lower Leg. The wound measures 2.1cm length x 0.9cm width x 0.1cm depth; 1.484cm^2 area and 0.148cm^3 volume. There is Fat Layer (Subcutaneous Tissue) Exposed exposed. There is no tunneling or undermining noted. There is a large amount of serous drainage noted. The wound margin is distinct with the outline attached to the wound base. There is small (1-33%) pink granulation within the wound bed. There is a large (67-100%) amount of necrotic tissue within the wound bed including Adherent Slough. The periwound skin appearance exhibited: Excoriation, Induration, Maceration. The periwound skin appearance did not exhibit: Callus, Crepitus, Rash, Scarring, Dry/Scaly, Atrophie Blanche, Cyanosis, Ecchymosis, Hemosiderin Staining, Mottled, Pallor, Rubor, Erythema. Periwound temperature was noted as No Abnormality. The periwound has tenderness on palpation. Assessment Active Problems ICD-10 L97.212 - Non-pressure chronic ulcer of right calf with fat layer exposed I89.0 - Lymphedema, not elsewhere classified I73.9 - Peripheral vascular disease, unspecified F17.218 - Nicotine dependence, cigarettes, with other nicotine-induced disorders Plan Wound Cleansing: Wound #1 Right,Lateral Lower Leg: Clean wound with Normal Saline. Cleanse wound with mild soap and water May Shower, gently pat wound dry prior to applying new dressing. Wound #2 Right,Posterior Lower Leg: Clean wound with Normal Saline. Cleanse wound with mild  soap and water May Shower, gently pat wound dry prior to applying new dressing. Anesthetic (add to Medication List): Wound #1 Right,Lateral Lower Leg: Topical Lidocaine 4% cream applied to wound bed prior to debridement (In Clinic Only). Wound #2 Right,Posterior Lower Leg: Kimberly [name], Kimberly [name] ([id number]) Topical Lidocaine 4% cream applied to wound bed prior to debridement (In Clinic Only). Primary Wound Dressing: Wound #1 Right,Lateral Lower Leg: Santyl Ointment - FINISH REMAINDER OF SANTYL AT HOME THEN CHANGE TO SILVER CELL ON WOUND Wound #2 Right,Posterior Lower Leg: Santyl Ointment - FINISH REMAINDER OF SANTYL AT HOME THEN CHANGE TO SILVERCELL Secondary Dressing: Wound #1 Right,Lateral Lower Leg: Conform/Kerlix Non-adherent pad Wound #2 Right,Posterior Lower Leg: Conform/Kerlix Non-adherent pad Dressing Change Frequency: Wound #1 Right,Lateral Lower Leg: Change dressing every day. Wound #2 Right,Posterior Lower Leg: Change dressing every day. Edema Control: Wound #1 Right,Lateral Lower Leg: Elevate legs to the level of the heart and pump ankles as often as possible Wound #2 Right,Posterior Lower Leg: Elevate legs to the level of the heart and pump ankles as often as possible Additional Orders / Instructions: Wound #1 Right,Lateral Lower Leg: Stop Smoking Increase protein intake. Wound #  2 Right,Posterior Lower Leg: Stop Smoking Increase protein intake. Medications-please add to medication list.: Wound #1 Right,Lateral Lower Leg: Santyl Enzymatic Ointment Other: - Vitamin C, Zinc, MVI Wound #2 Right,Posterior Lower Leg: Santyl Enzymatic Ointment Other: - Vitamin C, Zinc, MVI no sharp debridement was possible today and after a thorough review today I have recommended: 1. Santyl ointment locally to be applied every day after washing with soap and water and covering with a bordered foam 2. once her Santyl ointment is over she can go to silver alginate 3. Light compression  stockings to be worn daily all day except for bedtime 4. I reviewed her arterial procedure and her most recent arterial duplex study 5. discussed again with her the need to completely give up smoking and have discussed the risks benefits alternatives and all the possible methodology 6. Regular visits to the wound center Electronic Signature(s) Signed: 01/05/2017 10:50:27 AM By: Christin Fudge MD, FACS Previous Signature: 01/02/2017 12:03:24 PM Version By: Christin Fudge MD, FACS Entered By: Christin Fudge on 01/05/2017 10:50:26 Kimberly [name] ([id number]) Kimberly [name], Kimberly [name] ([id number]) -------------------------------------------------------------------------------- ROS/PFSH Details Patient Name: Kimberly [name] Date of Service: 01/02/2017 11:00 AM Medical Record Number: [id number] Patient Account Number: [id number] Date of Birth/Sex: 1945-08-07 (71 y.o. Female) Treating RN: Roger Shelter Primary Care Provider: Cathlean Cower Other Clinician: Referring Provider: Cathlean Cower Treating Provider/Extender: Frann Rider in Treatment: 4 Information Obtained From Patient Wound History Do you currently have one or more open woundso Yes How many open wounds do you currently haveo 1 Approximately how long have you had your woundso aug 2018 How have you been treating your wound(s) until nowo hydrogel ag Has your wound(s) ever healed and then re-openedo No Have you had any lab work done in the past montho No Have you tested positive for an antibiotic resistant organism (MRSA, VRE)o No Have you tested positive for osteomyelitis (bone infection)o No Have you had any tests for circulation on your legso No Have you had other problems associated with your woundso Swelling Hematologic/Lymphatic Medical History: Positive for: Anemia - hx Respiratory Medical History: Positive for: Asthma; Chronic Obstructive Pulmonary Disease (COPD) Cardiovascular Medical History: Positive for: Congestive Heart  Failure Immunizations Pneumococcal Vaccine: Received Pneumococcal Vaccination: Yes Implantable Devices Family and Social History Cancer: Yes - Father; Diabetes: No; Heart Disease: Yes - Siblings; Hereditary Spherocytosis: No; Hypertension: Yes - Mother; Kidney Disease: No; Lung Disease: No; Seizures: No; Stroke: No; Thyroid Problems: No; Tuberculosis: Yes - Father; Current every day smoker - 2-3 day; Marital Status - Married; Alcohol Use: Never; Drug Use: No History; Caffeine Use: Daily; Financial Concerns: No; Food, Clothing or Shelter Needs: No; Support System Lacking: No; Transportation Concerns: No; Advanced Directives: No; Patient does not want information on Advanced Directives; Do not resuscitate: No; Living Will: No; Medical Power of Attorney: No Physician Affirmation I have reviewed and agree with the above information. Electronic Signature(s) Signed: 01/02/2017 12:55:29 PM By: Christin Fudge MD, FACS Speculator, Kimberly BMarland Kitchen ([id number]) Signed: 01/02/2017 4:46:19 PM By: Roger Shelter Entered By: Christin Fudge on 01/02/2017 11:58:47 Kimberly [name] ([id number]) -------------------------------------------------------------------------------- SuperBill Details Patient Name: Kimberly [name] Date of Service: 01/02/2017 Medical Record Number: [id number] Patient Account Number: [id number] Date of Birth/Sex: 10/10/1945 (71 y.o. Female) Treating RN: Roger Shelter Primary Care Provider: Cathlean Cower Other Clinician: Referring Provider: Cathlean Cower Treating Provider/Extender: Frann Rider in Treatment: 4 Diagnosis Coding ICD-10 Codes Code Description [phone] Non-pressure chronic ulcer of right calf with fat layer exposed I89.0 Lymphedema, not elsewhere classified I73.9  Peripheral vascular disease, unspecified F17.218 Nicotine dependence, cigarettes, with other nicotine-induced disorders Facility Procedures CPT4 Code: [id number] Description: 99213 - WOUND CARE VISIT-LEV 3 EST  PT Modifier: Quantity: 1 Physician Procedures CPT4 Code: 8891694 Description: 50388 - WC PHYS LEVEL 3 - EST PT ICD-10 Diagnosis Description L97.212 Non-pressure chronic ulcer of right calf with fat layer ex I89.0 Lymphedema, not elsewhere classified I73.9 Peripheral vascular disease, unspecified F17.218 Nicotine  dependence, cigarettes, with other nicotine-induc Modifier: posed ed disorders Quantity: 1 Electronic Signature(s) Signed: 01/02/2017 12:03:49 PM By: Christin Fudge MD, FACS Entered By: Christin Fudge on 01/02/2017 12:03:48

## 2017-01-06 ENCOUNTER — Telehealth: Payer: Self-pay | Admitting: Cardiovascular Disease

## 2017-01-06 ENCOUNTER — Ambulatory Visit: Payer: Medicare Other | Admitting: Nurse Practitioner

## 2017-01-06 ENCOUNTER — Other Ambulatory Visit: Payer: Self-pay

## 2017-01-06 DIAGNOSIS — I739 Peripheral vascular disease, unspecified: Secondary | ICD-10-CM

## 2017-01-06 NOTE — Telephone Encounter (Signed)
Per 11/19 AVS, pt needed 1 month follow up with Dr. Fletcher Anon. Pt aware someone from scheduling will call to set this up. Routed to scheduling for ASAP appointment.

## 2017-01-06 NOTE — Progress Notes (Signed)
TERRACE, CHIEM ([id number]) Visit Report for 01/02/2017 Arrival Information Details Patient Name: Kimberly [name], Kimberly [name] Date of Service: 01/02/2017 11:00 AM Medical Record Number: [id number] Patient Account Number: [id number] Date of Birth/Sex: [birth year] (71 y.o. Female) Treating RN: Roger Shelter Primary Care Kao Berkheimer: Cathlean Cower Other Clinician: Referring Clarissa Laird: Cathlean Cower Treating Darien Kading/Extender: Frann Rider in Treatment: 4 Visit Information History Since Last Visit All ordered tests and consults were completed: No Patient Arrived: Ambulatory Added or deleted any medications: No Arrival Time: 11:08 Any new allergies or adverse reactions: No Accompanied By: husband Had a fall or experienced change in No Transfer Assistance: None activities of daily living that may affect Patient Identification Verified: Yes risk of falls: Secondary Verification Process Yes Signs or symptoms of abuse/neglect since last visito No Completed: Hospitalized since last visit: No Patient Requires Transmission-Based No Pain Present Now: No Precautions: Patient Has Alerts: Yes Patient Alerts: R ABI 0.62 from EPIC L ABI 0.72 from [facility] Electronic Signature(s) Signed: 01/02/2017 4:46:19 PM By: Roger Shelter Entered By: Roger Shelter on 01/02/2017 11:13:16 Kimberly [name] ([id number]) -------------------------------------------------------------------------------- Clinic Level of Care Assessment Details Patient Name: Kimberly [name] Date of Service: 01/02/2017 11:00 AM Medical Record Number: [id number] Patient Account Number: [id number] Date of Birth/Sex: [birth year] (71 y.o. Female) Treating RN: Roger Shelter Primary Care Claudie Brickhouse: Cathlean Cower Other Clinician: Referring Janica Eldred: Cathlean Cower Treating Konrad Hoak/Extender: Frann Rider in Treatment: 4 Clinic Level of Care Assessment Items TOOL 4 Quantity Score []  - Use when only an EandM is performed on FOLLOW-UP visit  0 ASSESSMENTS - Nursing Assessment / Reassessment []  - Reassessment of Co-morbidities (includes updates in patient status) 0 X- 1 5 Reassessment of Adherence to Treatment Plan ASSESSMENTS - Wound and Skin Assessment / Reassessment X - Simple Wound Assessment / Reassessment - one wound 1 5 []  - 0 Complex Wound Assessment / Reassessment - multiple wounds []  - 0 Dermatologic / Skin Assessment (not related to wound area) ASSESSMENTS - Focused Assessment []  - Circumferential Edema Measurements - multi extremities 0 []  - 0 Nutritional Assessment / Counseling / Intervention []  - 0 Lower Extremity Assessment (monofilament, tuning fork, pulses) []  - 0 Peripheral Arterial Disease Assessment (using hand held doppler) ASSESSMENTS - Ostomy and/or Continence Assessment and Care []  - Incontinence Assessment and Management 0 []  - 0 Ostomy Care Assessment and Management (repouching, etc.) PROCESS - Coordination of Care X - Simple Patient / Family Education for ongoing care 1 15 []  - 0 Complex (extensive) Patient / Family Education for ongoing care []  - 0 Staff obtains Programmer, systems, Records, Test Results / Process Orders []  - 0 Staff telephones HHA, Nursing Homes / Clarify orders / etc []  - 0 Routine Transfer to another Facility (non-emergent condition) []  - 0 Routine Hospital Admission (non-emergent condition) []  - 0 New Admissions / Biomedical engineer / Ordering NPWT, Apligraf, etc. []  - 0 Emergency Hospital Admission (emergent condition) X- 1 10 Simple Discharge Coordination Kimberly [name], Kimberly B. ([id number]) []  - 0 Complex (extensive) Discharge Coordination PROCESS - Special Needs []  - Pediatric / Minor Patient Management 0 []  - 0 Isolation Patient Management []  - 0 Hearing / Language / Visual special needs []  - 0 Assessment of Community assistance (transportation, D/C planning, etc.) []  - 0 Additional assistance / Altered mentation []  - 0 Support Surface(s) Assessment (bed,  cushion, seat, etc.) INTERVENTIONS - Wound Cleansing / Measurement []  - Simple Wound Cleansing - one wound 0 X- 2 5 Complex Wound Cleansing - multiple wounds X- 1 5 Wound Imaging (photographs -  any number of wounds) []  - 0 Wound Tracing (instead of photographs) []  - 0 Simple Wound Measurement - one wound X- 2 5 Complex Wound Measurement - multiple wounds INTERVENTIONS - Wound Dressings X - Small Wound Dressing one or multiple wounds 2 10 []  - 0 Medium Wound Dressing one or multiple wounds []  - 0 Large Wound Dressing one or multiple wounds []  - 0 Application of Medications - topical []  - 0 Application of Medications - injection INTERVENTIONS - Miscellaneous []  - External ear exam 0 []  - 0 Specimen Collection (cultures, biopsies, blood, body fluids, etc.) []  - 0 Specimen(s) / Culture(s) sent or taken to Lab for analysis []  - 0 Patient Transfer (multiple staff / Civil Service fast streamer / Similar devices) []  - 0 Simple Staple / Suture removal (25 or less) []  - 0 Complex Staple / Suture removal (26 or more) []  - 0 Hypo / Hyperglycemic Management (close monitor of Blood Glucose) []  - 0 Ankle / Brachial Index (ABI) - do not check if billed separately X- 1 5 Vital Signs Kimberly [name], Kimberly B. ([id number]) Has the patient been seen at the hospital within the last three years: Yes Total Score: 85 Level Of Care: New/Established - Level 3 Electronic Signature(s) Signed: 01/02/2017 4:46:19 PM By: Roger Shelter Entered By: Roger Shelter on 01/02/2017 11:51:07 Kimberly [name] ([id number]) -------------------------------------------------------------------------------- Encounter Discharge Information Details Patient Name: Kimberly [name] Date of Service: 01/02/2017 11:00 AM Medical Record Number: [id number] Patient Account Number: [id number] Date of Birth/Sex: Nov 09, 1945 (71 y.o. Female) Treating RN: Roger Shelter Primary Care Oberia Beaudoin: Cathlean Cower Other Clinician: Referring Koston Hennes: Cathlean Cower Treating Muhanad Torosyan/Extender: Frann Rider in Treatment: 4 Encounter Discharge Information Items Discharge Pain Level: 0 Discharge Condition: Stable Ambulatory Status: Ambulatory Discharge Destination: Home Transportation: Other Accompanied By: husbaND Schedule Follow-up Appointment: Yes Medication Reconciliation completed and No provided to Patient/Care Elric Tirado: Provided on Clinical Summary of Care: 01/02/2017 Form Type Recipient Paper Patient MW Electronic Signature(s) Signed: 01/06/2017 11:15:46 AM By: Ruthine Dose Entered By: Ruthine Dose on 01/02/2017 11:56:15 Kimberly [name] ([id number]) -------------------------------------------------------------------------------- Lower Extremity Assessment Details Patient Name: Kimberly [name] Date of Service: 01/02/2017 11:00 AM Medical Record Number: [id number] Patient Account Number: [id number] Date of Birth/Sex: 1945/08/13 (71 y.o. Female) Treating RN: Roger Shelter Primary Care Desiraye Rolfson: Cathlean Cower Other Clinician: Referring Taysean Wager: Cathlean Cower Treating Majour Frei/Extender: Frann Rider in Treatment: 4 Edema Assessment Assessed: [Left: No] [Right: No] Edema: [Left: N] [Right: o] Vascular Assessment Claudication: Claudication Assessment [Right:None] Pulses: Dorsalis Pedis Palpable: [Right:Yes] Posterior Tibial Extremity colors, hair growth, and conditions: Extremity Color: [Right:Normal] Hair Growth on Extremity: [Right:No] Temperature of Extremity: [Right:Warm] Capillary Refill: [Right:< 3 seconds] Toe Nail Assessment Left: Right: Thick: No Discolored: No Deformed: No Improper Length and Hygiene: No Electronic Signature(s) Signed: 01/02/2017 4:46:19 PM By: Roger Shelter Entered By: Roger Shelter on 01/02/2017 11:24:27 Kimberly [name] ([id number]) -------------------------------------------------------------------------------- Multi Wound Chart Details Patient Name: Kimberly [name] Date of Service: 01/02/2017 11:00 AM Medical Record Number: [id number] Patient Account Number: [id number] Date of Birth/Sex: 04/11/1945 (71 y.o. Female) Treating RN: Roger Shelter Primary Care Toniann Dickerson: Cathlean Cower Other Clinician: Referring Chonda Baney: Cathlean Cower Treating Cathey Fredenburg/Extender: Frann Rider in Treatment: 4 Vital Signs Height(in): 61 Pulse(bpm): 72 Weight(lbs): 111.9 Blood Pressure(mmHg): 101/58 Body Mass Index(BMI): 21 Temperature(F): 99.1 Respiratory Rate 20 (breaths/min): Photos: [1:No Photos] [2:No Photos] [N/A:N/A] Wound Location: [1:Right Lower Leg - Lateral] [2:Right Lower Leg - Posterior] [N/A:N/A] Wounding Event: [1:Trauma] [2:Trauma] [N/A:N/A] Primary Etiology: [1:Trauma, Other] [2:Trauma, Other] [N/A:N/A] Comorbid History: [1:Anemia,  Asthma, Chronic Obstructive Pulmonary Disease (COPD), Congestive Heart Failure] [2:Anemia, Asthma, Chronic Obstructive Pulmonary Disease (COPD), Congestive Heart Failure] [N/A:N/A] Date Acquired: [1:09/03/2016] [2:09/03/2016] [N/A:N/A] Weeks of Treatment: [1:4] [2:4] [N/A:N/A] Wound Status: [1:Open] [2:Open] [N/A:N/A] Measurements L x W x D [1:1.5x0.6x0.1] [2:2.1x0.9x0.1] [N/A:N/A] (cm) Area (cm) : [1:0.707] [2:1.484] [N/A:N/A] Volume (cm) : [1:0.071] [2:0.148] [N/A:N/A] % Reduction in Area: [1:35.70%] [2:1.60%] [N/A:N/A] % Reduction in Volume: [1:67.70%] [2:2.00%] [N/A:N/A] Classification: [1:Partial Thickness] [2:Partial Thickness] [N/A:N/A] Exudate Amount: [1:Large] [2:Large] [N/A:N/A] Exudate Type: [1:Serous] [2:Serous] [N/A:N/A] Exudate Color: [1:amber] [2:amber] [N/A:N/A] Wound Margin: [1:Distinct, outline attached] [2:Distinct, outline attached] [N/A:N/A] Granulation Amount: [1:Small (1-33%)] [2:Small (1-33%)] [N/A:N/A] Granulation Quality: [1:Pink] [2:Pink] [N/A:N/A] Necrotic Amount: [1:Large (67-100%)] [2:Large (67-100%)] [N/A:N/A] Exposed Structures: [1:Fat Layer (Subcutaneous Tissue) Exposed:  Yes] [2:Fat Layer (Subcutaneous Tissue) Exposed: Yes] [N/A:N/A] Epithelialization: [1:None] [2:None] [N/A:N/A] Periwound Skin Texture: [1:Excoriation: Yes Induration: Yes Callus: No Crepitus: No Rash: No Scarring: No] [2:Excoriation: Yes Induration: Yes Callus: No Crepitus: No Rash: No Scarring: No] [N/A:N/A] Periwound Skin Moisture: [1:Maceration: Yes Dry/Scaly: No] [2:Maceration: Yes Dry/Scaly: No] [N/A:N/A] Periwound Skin Color: [N/A:N/A] Atrophie Blanche: No Atrophie Blanche: No Cyanosis: No Cyanosis: No Ecchymosis: No Ecchymosis: No Erythema: No Erythema: No Hemosiderin Staining: No Hemosiderin Staining: No Mottled: No Mottled: No Pallor: No Pallor: No Rubor: No Rubor: No Temperature: No Abnormality No Abnormality N/A Tenderness on Palpation: Yes Yes N/A Wound Preparation: Ulcer Cleansing: Ulcer Cleansing: N/A Rinsed/Irrigated with Saline Rinsed/Irrigated with Saline Topical Anesthetic Applied: Topical Anesthetic Applied: Other: lidocaine 4% Other: lidocaine 4# Treatment Notes Wound #1 (Right, Lateral Lower Leg) 1. Cleansed with: Clean wound with Normal Saline May Shower, gently pat wound dry prior to applying new dressing. 2. Anesthetic Topical Lidocaine 4% cream to wound bed prior to debridement 4. Dressing Applied: Santyl Ointment 5. Secondary Dressing Applied Non-Adherent pad Notes kerlix and coban wrap Wound #2 (Right, Posterior Lower Leg) 1. Cleansed with: Clean wound with Normal Saline May Shower, gently pat wound dry prior to applying new dressing. 2. Anesthetic Topical Lidocaine 4% cream to wound bed prior to debridement 4. Dressing Applied: Santyl Ointment 5. Secondary Dressing Applied Non-Adherent pad Notes kerlix and coban wrap Electronic Signature(s) Signed: 01/02/2017 11:58:20 AM By: Christin Fudge MD, FACS Entered By: Christin Fudge on 01/02/2017 11:58:20 Kimberly [name]  ([id number]) -------------------------------------------------------------------------------- Multi-Disciplinary Care Plan Details Patient Name: Kimberly [name] Date of Service: 01/02/2017 11:00 AM Medical Record Number: [id number] Patient Account Number: [id number] Date of Birth/Sex: [birth year] (71 y.o. Female) Treating RN: Roger Shelter Primary Care Luara Faye: Cathlean Cower Other Clinician: Referring Adalaide Jaskolski: Cathlean Cower Treating Deauna Yaw/Extender: Frann Rider in Treatment: 4 Active Inactive ` Nutrition Nursing Diagnoses: Imbalanced nutrition Potential for alteratiion in Nutrition/Potential for imbalanced nutrition Goals: Patient/caregiver agrees to and verbalizes understanding of need to use nutritional supplements and/or vitamins as prescribed Date Initiated: 12/05/2016 Target Resolution Date: 02/28/2017 Goal Status: Active Interventions: Assess patient nutrition upon admission and as needed per policy Notes: ` Orientation to the Wound Care Program Nursing Diagnoses: Knowledge deficit related to the wound healing center program Goals: Patient/caregiver will verbalize understanding of the [place] Date Initiated: 12/05/2016 Target Resolution Date: 12/27/2016 Goal Status: Active Interventions: Provide education on orientation to the wound center Notes: ` Pain, Acute or Chronic Nursing Diagnoses: Pain, acute or chronic: actual or potential Potential alteration in comfort, pain Goals: Patient/caregiver will verbalize adequate pain control between visits Date Initiated: 12/05/2016 Target Resolution Date: 03/28/2017 Goal Status: Active Kimberly [name], Kimberly B. ([id number]) Interventions: Complete pain assessment as per visit requirements Notes: ` Wound/Skin  Impairment Nursing Diagnoses: Impaired tissue integrity Knowledge deficit related to smoking impact on wound healing Knowledge deficit related to ulceration/compromised skin  integrity Goals: Ulcer/skin breakdown will have a volume reduction of 80% by week 12 Date Initiated: 12/05/2016 Target Resolution Date: 03/28/2017 Goal Status: Active Interventions: Assess patient/caregiver ability to perform ulcer/skin care regimen upon admission and as needed Assess ulceration(s) every visit Provide education on smoking Notes: Electronic Signature(s) Signed: 01/02/2017 4:46:19 PM By: Roger Shelter Entered By: Roger Shelter on 01/02/2017 11:32:05 Kimberly [name] ([id number]) -------------------------------------------------------------------------------- Pain Assessment Details Patient Name: Kimberly [name] Date of Service: 01/02/2017 11:00 AM Medical Record Number: [id number] Patient Account Number: [id number] Date of Birth/Sex: 1945-12-15 (71 y.o. Female) Treating RN: Roger Shelter Primary Care Swanson Farnell: Cathlean Cower Other Clinician: Referring Keyla Milone: Cathlean Cower Treating Avenir Lozinski/Extender: Frann Rider in Treatment: 4 Active Problems Location of Pain Severity and Description of Pain Patient Has Paino Yes Site Locations Duration of the Pain. Constant / Intermittento Intermittent Rate the pain. Current Pain Level: 5 Character of Pain Describe the Pain: Burning Pain Management and Medication Current Pain Management: Electronic Signature(s) Signed: 01/02/2017 4:46:19 PM By: Roger Shelter Entered By: Roger Shelter on 01/02/2017 11:13:47 Kimberly [name] ([id number]) -------------------------------------------------------------------------------- Patient/Caregiver Education Details Patient Name: Kimberly [name] Date of Service: 01/02/2017 11:00 AM Medical Record Number: [id number] Patient Account Number: [id number] Date of Birth/Gender: 1946/01/04 (71 y.o. Female) Treating RN: Roger Shelter Primary Care Physician: Cathlean Cower Other Clinician: Referring Physician: Cathlean Cower Treating Physician/Extender: Frann Rider in  Treatment: 4 Education Assessment Education Provided To: Patient and Caregiver HUSBAND Education Topics Provided Smoking and Wound Healing: Handouts: Smoking and Wound Healing Methods: Explain/Verbal Responses: State content correctly Wound/Skin Impairment: Handouts: Caring for Your Ulcer Methods: Explain/Verbal Responses: State content correctly Electronic Signature(s) Signed: 01/02/2017 4:46:19 PM By: Roger Shelter Entered By: Roger Shelter on 01/02/2017 11:53:29 Kimberly [name] ([id number]) -------------------------------------------------------------------------------- Wound Assessment Details Patient Name: Kimberly [name] Date of Service: 01/02/2017 11:00 AM Medical Record Number: [id number] Patient Account Number: [id number] Date of Birth/Sex: 06/27/1945 (71 y.o. Female) Treating RN: Roger Shelter Primary Care Cliffard Hair: Cathlean Cower Other Clinician: Referring Rankin Coolman: Cathlean Cower Treating Brendalee Matthies/Extender: Frann Rider in Treatment: 4 Wound Status Wound Number: 1 Primary Trauma, Other Etiology: Wound Location: Right Lower Leg - Lateral Wound Open Wounding Event: Trauma Status: Date Acquired: 09/03/2016 Comorbid Anemia, Asthma, Chronic Obstructive Weeks Of Treatment: 4 History: Pulmonary Disease (COPD), Congestive Heart Clustered Wound: No Failure Photos Wound Measurements Length: (cm) 1.5 Width: (cm) 0.6 Depth: (cm) 0.1 Area: (cm) 0.707 Volume: (cm) 0.071 % Reduction in Area: 35.7% % Reduction in Volume: 67.7% Epithelialization: None Tunneling: No Undermining: No Wound Description Classification: Partial Thickness Wound Margin: Distinct, outline attached Exudate Amount: Large Exudate Type: Serous Exudate Color: amber Foul Odor After Cleansing: No Slough/Fibrino Yes Wound Bed Granulation Amount: Small (1-33%) Exposed Structure Granulation Quality: Pink Fat Layer (Subcutaneous Tissue) Exposed: Yes Necrotic Amount: Large  (67-100%) Necrotic Quality: Adherent Slough Periwound Skin Texture Texture Color No Abnormalities Noted: No No Abnormalities Noted: No Callus: No Atrophie Blanche: No Crepitus: No Cyanosis: No Kimberly [name], Kimberly [name] ([id number]) Excoriation: Yes Ecchymosis: No Induration: Yes Erythema: No Rash: No Hemosiderin Staining: No Scarring: No Mottled: No Pallor: No Moisture Rubor: No No Abnormalities Noted: No Dry / Scaly: No Temperature / Pain Maceration: Yes Temperature: No Abnormality Tenderness on Palpation: Yes Wound Preparation Ulcer Cleansing: Rinsed/Irrigated with Saline Topical Anesthetic Applied: Other: lidocaine 4%, Treatment Notes Wound #1 (Right, Lateral Lower Leg) 1. Cleansed with: Clean wound with Normal  Saline May Shower, gently pat wound dry prior to applying new dressing. 2. Anesthetic Topical Lidocaine 4% cream to wound bed prior to debridement 4. Dressing Applied: Santyl Ointment 5. Secondary Dressing Applied Non-Adherent pad Notes kerlix and coban wrap Electronic Signature(s) Signed: 01/02/2017 4:56:06 PM By: Roger Shelter Previous Signature: 01/02/2017 4:46:19 PM Version By: Roger Shelter Entered By: Roger Shelter on 01/02/2017 16:56:06 Kimberly [name] ([id number]) -------------------------------------------------------------------------------- Wound Assessment Details Patient Name: Kimberly [name] Date of Service: 01/02/2017 11:00 AM Medical Record Number: [id number] Patient Account Number: [id number] Date of Birth/Sex: 1945-10-14 (71 y.o. Female) Treating RN: Roger Shelter Primary Care Denaja Verhoeven: Cathlean Cower Other Clinician: Referring Dashonda Bonneau: Cathlean Cower Treating Haroon Shatto/Extender: Frann Rider in Treatment: 4 Wound Status Wound Number: 2 Primary Trauma, Other Etiology: Wound Location: Right Lower Leg - Posterior Wound Open Wounding Event: Trauma Status: Date Acquired: 09/03/2016 Comorbid Anemia, Asthma, Chronic  Obstructive Weeks Of Treatment: 4 History: Pulmonary Disease (COPD), Congestive Heart Clustered Wound: No Failure Photos Wound Measurements Length: (cm) 2.1 Width: (cm) 0.9 Depth: (cm) 0.1 Area: (cm) 1.484 Volume: (cm) 0.148 % Reduction in Area: 1.6% % Reduction in Volume: 2% Epithelialization: None Tunneling: No Undermining: No Wound Description Classification: Partial Thickness Wound Margin: Distinct, outline attached Exudate Amount: Large Exudate Type: Serous Exudate Color: amber Foul Odor After Cleansing: No Slough/Fibrino Yes Wound Bed Granulation Amount: Small (1-33%) Exposed Structure Granulation Quality: Pink Fat Layer (Subcutaneous Tissue) Exposed: Yes Necrotic Amount: Large (67-100%) Necrotic Quality: Adherent Slough Periwound Skin Texture Texture Color No Abnormalities Noted: No No Abnormalities Noted: No Callus: No Atrophie Blanche: No Crepitus: No Cyanosis: No Kimberly [name], Kimberly [name] ([id number]) Excoriation: Yes Ecchymosis: No Induration: Yes Erythema: No Rash: No Hemosiderin Staining: No Scarring: No Mottled: No Pallor: No Moisture Rubor: No No Abnormalities Noted: No Dry / Scaly: No Temperature / Pain Maceration: Yes Temperature: No Abnormality Tenderness on Palpation: Yes Wound Preparation Ulcer Cleansing: Rinsed/Irrigated with Saline Topical Anesthetic Applied: Other: lidocaine 4#, Treatment Notes Wound #2 (Right, Posterior Lower Leg) 1. Cleansed with: Clean wound with Normal Saline May Shower, gently pat wound dry prior to applying new dressing. 2. Anesthetic Topical Lidocaine 4% cream to wound bed prior to debridement 4. Dressing Applied: Santyl Ointment 5. Secondary Dressing Applied Non-Adherent pad Notes kerlix and coban wrap Electronic Signature(s) Signed: 01/02/2017 4:56:32 PM By: Roger Shelter Previous Signature: 01/02/2017 4:46:19 PM Version By: Roger Shelter Entered By: Roger Shelter on 01/02/2017  16:56:32 Kimberly [name] ([id number]) -------------------------------------------------------------------------------- Vitals Details Patient Name: Kimberly [name] Date of Service: 01/02/2017 11:00 AM Medical Record Number: [id number] Patient Account Number: [id number] Date of Birth/Sex: 02-04-1945 (71 y.o. Female) Treating RN: Roger Shelter Primary Care Hallie Ertl: Cathlean Cower Other Clinician: Referring Isobelle Tuckett: Cathlean Cower Treating Rachit Grim/Extender: Frann Rider in Treatment: 4 Vital Signs Time Taken: 11:13 Temperature (F): 99.1 Height (in): 61 Pulse (bpm): 73 Weight (lbs): 111.9 Respiratory Rate (breaths/min): 20 Body Mass Index (BMI): 21.1 Blood Pressure (mmHg): 101/58 Reference Range: 80 - 120 mg / dl Electronic Signature(s) Signed: 01/02/2017 4:46:19 PM By: Roger Shelter Entered By: Roger Shelter on 01/02/2017 11:14:06

## 2017-01-08 ENCOUNTER — Ambulatory Visit (HOSPITAL_COMMUNITY)
Admission: RE | Admit: 2017-01-08 | Discharge: 2017-01-08 | Disposition: A | Discharge status: Home / Self Care | Payer: Medicare Other | Source: Ambulatory Visit | Attending: Cardiology | Admitting: Cardiology

## 2017-01-08 ENCOUNTER — Encounter (HOSPITAL_COMMUNITY): Payer: Self-pay | Admitting: Cardiology

## 2017-01-08 VITALS — BP 111/50 | HR 79 | Ht 61.0 in | Wt 109.0 lb

## 2017-01-08 DIAGNOSIS — I2510 Atherosclerotic heart disease of native coronary artery without angina pectoris: Secondary | ICD-10-CM | POA: Insufficient documentation

## 2017-01-08 DIAGNOSIS — Z79899 Other long term (current) drug therapy: Secondary | ICD-10-CM | POA: Insufficient documentation

## 2017-01-08 DIAGNOSIS — Z791 Long term (current) use of non-steroidal anti-inflammatories (NSAID): Secondary | ICD-10-CM | POA: Diagnosis not present

## 2017-01-08 DIAGNOSIS — Z720 Tobacco use: Secondary | ICD-10-CM | POA: Diagnosis not present

## 2017-01-08 DIAGNOSIS — I429 Cardiomyopathy, unspecified: Secondary | ICD-10-CM | POA: Insufficient documentation

## 2017-01-08 DIAGNOSIS — F17210 Nicotine dependence, cigarettes, uncomplicated: Secondary | ICD-10-CM | POA: Insufficient documentation

## 2017-01-08 DIAGNOSIS — I739 Peripheral vascular disease, unspecified: Secondary | ICD-10-CM | POA: Diagnosis not present

## 2017-01-08 DIAGNOSIS — Z7982 Long term (current) use of aspirin: Secondary | ICD-10-CM | POA: Insufficient documentation

## 2017-01-08 DIAGNOSIS — E785 Hyperlipidemia, unspecified: Secondary | ICD-10-CM | POA: Diagnosis not present

## 2017-01-08 DIAGNOSIS — I4510 Unspecified right bundle-branch block: Secondary | ICD-10-CM | POA: Insufficient documentation

## 2017-01-08 DIAGNOSIS — I5022 Chronic systolic (congestive) heart failure: Secondary | ICD-10-CM | POA: Insufficient documentation

## 2017-01-08 DIAGNOSIS — I340 Nonrheumatic mitral (valve) insufficiency: Secondary | ICD-10-CM | POA: Diagnosis not present

## 2017-01-08 DIAGNOSIS — J449 Chronic obstructive pulmonary disease, unspecified: Secondary | ICD-10-CM | POA: Insufficient documentation

## 2017-01-08 DIAGNOSIS — Z9889 Other specified postprocedural states: Secondary | ICD-10-CM | POA: Insufficient documentation

## 2017-01-08 DIAGNOSIS — I480 Paroxysmal atrial fibrillation: Secondary | ICD-10-CM | POA: Insufficient documentation

## 2017-01-08 LAB — BASIC METABOLIC PANEL
Anion gap: 6 (ref 5–15)
BUN: 8 mg/dL (ref 6–20)
CO2: 34 mmol/L — ABNORMAL HIGH (ref 22–32)
CREATININE: 1.04 mg/dL — AB (ref 0.44–1.00)
Calcium: 9.1 mg/dL (ref 8.9–10.3)
Chloride: 99 mmol/L — ABNORMAL LOW (ref 101–111)
GFR, EST NON AFRICAN AMERICAN: 54 mL/min — AB (ref >60)
Glucose, Bld: 106 mg/dL — ABNORMAL HIGH (ref 65–99)
POTASSIUM: 4.3 mmol/L (ref 3.5–5.1)
SODIUM: 139 mmol/L (ref 135–145)

## 2017-01-08 MED ORDER — BUPROPION HCL ER (SR) 150 MG PO TB12: ORAL_TABLET | ORAL | 3 refills | Status: DC

## 2017-01-08 MED ORDER — SPIRONOLACTONE 25 MG PO TABS: 12.5000 mg | ORAL_TABLET | Freq: Every day | ORAL | 3 refills | Status: DC

## 2017-01-08 MED ORDER — DIGOXIN 125 MCG PO TABS: 0.1250 mg | ORAL_TABLET | Freq: Every day | ORAL | 3 refills | Status: DC

## 2017-01-08 NOTE — Progress Notes (Signed)
PCP: Dr. Jenny Reichmann Cardiology: Dr. Harrington Challenger HF Cardiology: Dr. Aundra Dubin  71 yo with history of VSD repair as a child, COPD/active smoking, PAD, and chronic systolic CHF was referred by Dr. Harrington Challenger for evaluation of CHF.   Patient has a long history of cardiomyopathy.  She had VSD repaired as a child and imaging has not showed residual VSD.  Cardiac MRI in 11/09 showed EF 36%.  Echo in 12/15 showed EF 40-45%.  Echo in 3/18 showed EF down to 15-20% with moderate RV systolic dysfunction.  RHC/LHC in 10/18 showed nonobstructive coronary but was concerning for low output (CI 1.33).  She has a right lower leg slowly healing ulcer.  She recently had peripheral arteriogram with bilateral occluded CFAs. She PCI to right CFA and ulcer is starting to heal.    Currently, no claudication in left leg when walking.  She is still smoking. She failed Chantix in the past.  She has been more short of breath with exertion for 2-3 months. She is short of breath after walking about 100 feet or with carrying laundary.  She does not get dyspneic showering/dressing.  She takes her time when she walks.  Generalized fatigue.  Rare PND.  She has mild orthopnea, folds her pillow to make it thicker.  No chest pain.    Labs (7/18): LDL 61 Labs (11/18): K 4.2, creatinine 0.7, hgb 13.7  ECG (personally reviewed): NSR, RBBB  PMH: 1. VSD: s/p repair at [facility] as child.  From description, sounds like muscular VSD.  2. Atrial tachycardia s/p ablation.  3. Hyperlipidemia 4. COPD: Active smoker.  5. Atrial fibrillation: Paroxysmal.   Noted only transiently.   6. PAD: ABIs 11/18 with 0.62 on right, 0.72 on left.  - Angiogram 11/18 showed totally occluded bilateral CFAs.  Patient had stent to right CFA.  ABIs in 12/18 were normal on right.  7. Chronic systolic CHF: Nonischemic cardiomyopathy.   - cMRI (11/09): EF 36%, VSD patch in anterior ventricular septum - Echo (12/15): EF 40-45% - Echo (3/18): EF 15-20%, moderate LV dilation, moderate MR,  moderate RV dilation with moderately decreased systolic function.  - LHC/RHC (10/18): 3+ MR, EF < 25%, minimal nonobstructive CAD; PA 41/21, LVEDP 18, CI 1.33.   SH: Active smoker, lives in [place], married.  FH: Mother with PAD, sister died at birth from congenital heart disease.   ROS: All systems reviewed and negative except as per HPI.   Current Outpatient Medications  Medication Sig Dispense Refill  . albuterol (VENTOLIN HFA) 108 (90 Base) MCG/ACT inhaler Inhale 2 puffs into the lungs every 6 (six) hours as needed for wheezing or shortness of breath. 18 g 11  . ALPRAZolam (XANAX) 0.25 MG tablet Take 1 tablet (0.25 mg total) by mouth 2 (two) times daily as needed for anxiety. 60 tablet 2  . aspirin 81 MG tablet Take 1 tablet (81 mg total) by mouth daily. 100 tablet 99  . carvedilol (COREG) 3.125 MG tablet Take 1 tablet (3.125 mg total) by mouth 2 (two) times daily. 60 tablet 3  . celecoxib (CELEBREX) 100 MG capsule Take 100 mg by mouth 2 (two) times daily as needed for moderate pain.    Marland Kitchen clopidogrel (PLAVIX) 75 MG tablet Take 1 tablet (75 mg total) by mouth daily. 30 tablet 6  . collagenase (SANTYL) ointment Apply 1 application topically daily.    . furosemide (LASIX) 40 MG tablet Take 1 tablet (40 mg total) daily by mouth. 90 tablet 3  . Hydrocod Polst-Chlorphen  Polst 10-8 MG CP12 Take 5 mLs every 12 (twelve) hours as needed by mouth (for cough). 90 each 0  . ibuprofen (ADVIL,MOTRIN) 200 MG tablet Take 200 mg by mouth daily as needed for headache.    . losartan (COZAAR) 25 MG tablet Take 1 tablet (25 mg total) by mouth daily. (Patient taking differently: Take 25 mg by mouth at bedtime. ) 90 tablet 3  . potassium chloride (K-DUR) 10 MEQ tablet Take 1 tablet (10 mEq total) daily by mouth. (Patient taking differently: Take 10 mEq by mouth 2 (two) times daily. ) 90 tablet 3  . rosuvastatin (CRESTOR) 10 MG tablet TAKE 1 TABLET BY MOUTH EVERY DAY. 90 tablet 3  . SILVER HYDROGEL GEL Apply 1  inch topically 2 (two) times daily. 1 Tube 0  . buPROPion (WELLBUTRIN SR) 150 MG 12 hr tablet Take 1 tablet (150 mg total) by mouth daily for 3 days, THEN 1 tablet (150 mg total) 2 (two) times daily. 60 tablet 3  . digoxin (LANOXIN) 0.125 MG tablet Take 1 tablet (0.125 mg total) by mouth daily. 90 tablet 3  . spironolactone (ALDACTONE) 25 MG tablet Take 0.5 tablets (12.5 mg total) by mouth daily. 45 tablet 3   No current facility-administered medications for this encounter.    BP (!) 111/50 (BP Location: Left Arm, Patient Position: Sitting, Cuff Size: Normal)   Pulse 79   Ht 5\' 1"  (1.549 m)   Wt 109 lb (49.4 kg)   SpO2 100%   BMI 20.60 kg/m  General: NAD Neck: No JVD, no thyromegaly or thyroid nodule.  Lungs: Occasional rhonchi.  CV: Nondisplaced PMI.  Heart regular S1/S2, no S3/S4, 3/6 HSM at apex.  Trace ankle edema.  No carotid bruit.  Unable to palpate pedal pulses.  Abdomen: Soft, nontender, no hepatosplenomegaly, no distention.  Skin: Intact without lesions or rashes.  Neurologic: Alert and oriented x 3.  Psych: Normal affect. Extremities: No clubbing or cyanosis.  Right leg ulcer is wrapped.  HEENT: Normal.   Assessment/Plan: 1.  PAD: She had recent PCI to occluded right CFA, right leg ulcer now healing.  She has occluded left CFA also that was not intervened on. She reports minimal claudication in that leg.  - Needs to stop smoking.  - Continue statin.  2. Smoking: I strongly encouraged her to quit.  3. CAD: Nonobstructive on 10/18 cath.  4. Hyperlipidemia: Goal LDL < 70 , at goal on 7/18 lipids profile, continue Crestor.  5. COPD: I strongly encouraged her to quit smoking. Failed Chantix in the past.  - I gave her a prescription for Wellbutrin to try.  - I will arrange for PFTs to quantify severity of lung disease.  6. Chronic systolic CHF: Nonischemic cardiomyopathy.  RHC/LHC in 10/18 showed no nonobstructive coronary disease but CI was very low at 1.33.  Her low cardiac  output is out of proportion to her symptoms, which are more minor.  On exam, she is not volume overloaded. NYHA class III symptoms.  - Continue Lasix 40 mg daily, volume ok. - Continue losartan 25 mg qhs and Coreg 3.125 mg bid.  - Start spironolactone 12.5 mg daily, BMET today and again in 10 days.  - Start digoxin 0.125 daily with low output.  - I will arrange for CPX, she may need to ride the bike with leg ulcer.  - At next appointment, I will refer her to EP for ICD evaluation.  She will not be a CRT candidate with RBBB.  -  She may be a future LVAD candidate (depending on severity of lung disease).  7. Mitral regurgitation: Moderate on last echo, likely functional.   Loralie Champagne 01/08/2017

## 2017-01-08 NOTE — Telephone Encounter (Signed)
Scheduled for 02/11/17 10:30 am with Ignacia Bayley

## 2017-01-08 NOTE — Patient Instructions (Signed)
Labs today (will call for abnormal results, otherwise no news is good news)  START Digoxin 0.125 mg (1 Tablet) Once Daily  START Spironolactone 12.5 mg (0.5 Tablet) Once Daily  START Wellbutrin 150 mg (1 Tablet) for 3 days then increase to Twice Daily.  CPX and Pulmonary Function test has been ordered for you.  We will schedule at checkout.  Please have labs drawn in 10-12 days, see attached prescription.  Follow up in 3 weeks.

## 2017-01-08 NOTE — Telephone Encounter (Signed)
Pt 11/19 AVS, pt needed 1 month f/u w/Dr. Fletcher Anon s/p abdominal aortogram. This appt needs to be with Dr. Fletcher Anon, not an APP as she has PAD. Routed to support pool to reschedule w/MD.

## 2017-01-09 ENCOUNTER — Encounter: Payer: Medicare Other | Admitting: Physician Assistant

## 2017-01-09 DIAGNOSIS — I110 Hypertensive heart disease with heart failure: Secondary | ICD-10-CM | POA: Diagnosis not present

## 2017-01-09 DIAGNOSIS — G43909 Migraine, unspecified, not intractable, without status migrainosus: Secondary | ICD-10-CM | POA: Diagnosis not present

## 2017-01-09 DIAGNOSIS — E785 Hyperlipidemia, unspecified: Secondary | ICD-10-CM | POA: Diagnosis not present

## 2017-01-09 DIAGNOSIS — I890 Lymphedema, not elsewhere classified: Secondary | ICD-10-CM | POA: Diagnosis not present

## 2017-01-09 DIAGNOSIS — L97212 Non-pressure chronic ulcer of right calf with fat layer exposed: Secondary | ICD-10-CM | POA: Diagnosis not present

## 2017-01-09 DIAGNOSIS — I509 Heart failure, unspecified: Secondary | ICD-10-CM | POA: Diagnosis not present

## 2017-01-09 DIAGNOSIS — I739 Peripheral vascular disease, unspecified: Secondary | ICD-10-CM | POA: Diagnosis not present

## 2017-01-09 DIAGNOSIS — J449 Chronic obstructive pulmonary disease, unspecified: Secondary | ICD-10-CM | POA: Diagnosis not present

## 2017-01-11 NOTE — Progress Notes (Signed)
NAKAILA, FREEZE ([id number]) Visit Report for 01/09/2017 Chief Complaint Document Details Patient Name: Kimberly [name], Kimberly [name] Date of Service: 01/09/2017 1:45 PM Medical Record Number: [id number] Patient Account Number: [id number] Date of Birth/Sex: 1945/02/05 (71 y.o. Female) Treating RN: Roger Shelter Primary Care Provider: Cathlean Cower Other Clinician: Referring Provider: Cathlean Cower Treating Provider/Extender: Melburn Hake, HOYT Weeks in Treatment: 5 Information Obtained from: Patient Chief Complaint Patient seen for complaints of Non-Healing Wound to the right lateral calf Electronic Signature(s) Signed: 01/11/2017 1:59:17 AM By: Worthy Keeler PA-C Entered By: Worthy Keeler on 01/09/2017 14:32:43 Kimberly [name] ([id number]) -------------------------------------------------------------------------------- Debridement Details Patient Name: Kimberly [name] Date of Service: 01/09/2017 1:45 PM Medical Record Number: [id number] Patient Account Number: [id number] Date of Birth/Sex: August 21, 1945 (71 y.o. Female) Treating RN: Roger Shelter Primary Care Provider: Cathlean Cower Other Clinician: Referring Provider: Cathlean Cower Treating Provider/Extender: Melburn Hake, HOYT Weeks in Treatment: 5 Debridement Performed for Wound #1 Right,Lateral Lower Leg Assessment: Performed By: Physician STONE III, HOYT E., PA-C Debridement: Debridement Pre-procedure Verification/Time Yes - 02:30 Out Taken: Start Time: 02:30 Pain Control: Other : lidocaine 4% Level: Skin/Subcutaneous Tissue Total Area Debrided (L x W): 1 (cm) x 0.5 (cm) = 0.5 (cm) Tissue and other material Non-Viable, Eschar, Fibrin/Slough, Skin, Subcutaneous debrided: Instrument: Curette Bleeding: Moderate Hemostasis Achieved: Pressure End Time: 02:32 Procedural Pain: 0 Post Procedural Pain: 0 Response to Treatment: Procedure was tolerated well Post Debridement Measurements of Total Wound Length: (cm) 1 Width: (cm) 0.5 Depth:  (cm) 0.2 Volume: (cm) 0.079 Character of Wound/Ulcer Post Debridement: Stable Post Procedure Diagnosis Same as Pre-procedure Electronic Signature(s) Signed: 01/09/2017 4:21:05 PM By: Roger Shelter Signed: 01/11/2017 1:59:17 AM By: Worthy Keeler PA-C Entered By: Roger Shelter on 01/09/2017 14:56:43 Kimberly [name] ([id number]) -------------------------------------------------------------------------------- Debridement Details Patient Name: Kimberly [name] Date of Service: 01/09/2017 1:45 PM Medical Record Number: [id number] Patient Account Number: [id number] Date of Birth/Sex: Dec 24, 1945 (71 y.o. Female) Treating RN: Roger Shelter Primary Care Provider: Cathlean Cower Other Clinician: Referring Provider: Cathlean Cower Treating Provider/Extender: Melburn Hake, HOYT Weeks in Treatment: 5 Debridement Performed for Wound #2 Right,Posterior Lower Leg Assessment: Performed By: Physician STONE III, HOYT E., PA-C Debridement: Debridement Pre-procedure Verification/Time Yes - 02:30 Out Taken: Start Time: 02:30 Pain Control: Other : lidocaine 4% Level: Skin/Subcutaneous Tissue Total Area Debrided (L x W): 1.8 (cm) x 0.7 (cm) = 1.26 (cm) Tissue and other material Non-Viable, Eschar, Fibrin/Slough, Skin, Subcutaneous debrided: Instrument: Curette Bleeding: Moderate Hemostasis Achieved: Pressure End Time: 02:32 Procedural Pain: 0 Post Procedural Pain: 0 Response to Treatment: Procedure was tolerated well Post Debridement Measurements of Total Wound Length: (cm) 1.8 Width: (cm) 0.7 Depth: (cm) 0.1 Volume: (cm) 0.099 Character of Wound/Ulcer Post Debridement: Stable Post Procedure Diagnosis Same as Pre-procedure Electronic Signature(s) Signed: 01/09/2017 4:21:05 PM By: Roger Shelter Signed: 01/11/2017 1:59:17 AM By: Worthy Keeler PA-C Entered By: Roger Shelter on 01/09/2017 14:57:26 Kimberly [name]  ([id number]) -------------------------------------------------------------------------------- HPI Details Patient Name: Kimberly [name] Date of Service: 01/09/2017 1:45 PM Medical Record Number: [id number] Patient Account Number: [id number] Date of Birth/Sex: 10-11-1945 (71 y.o. Female) Treating RN: Roger Shelter Primary Care Provider: Cathlean Cower Other Clinician: Referring Provider: Cathlean Cower Treating Provider/Extender: Melburn Hake, HOYT Weeks in Treatment: 5 History of Present Illness Location: right lateral calf Quality: Patient reports experiencing a sharp pain to affected area(s). Severity: Patient states wound are getting worse. Duration: Patient has had the wound for > 3 months prior to seeking treatment at the wound center Timing: Pain in  wound is constant (hurts all the time) Context: The wound occurred when the patient had a injury to her leg with a sharp object which caused a laceration Modifying Factors: Other treatment(s) tried include:of hydrogen peroxide, Neosporin and antibiotics Associated Signs and Symptoms: Patient reports having increase swelling. HPI Description: 71 year old patient was seen by the nurse practitioner at the [facility] primary care for a wound which has been there on her right leg since August 2018. For a period of time she has been treated with doxycycline, clindamycin and Bactrim and the wound continues to have drainage and odor.on 11/20/2016 she was again put on Bactrim DS and referred to the wound center. Past medical history significant for hypertension, congestive heart failure, COPD, dyslipidemia, migraines, pericarditis, status post VSD repair, cholecystectomy tubal ligation, cesarean section, coronary angiogram,my tenotomy with tube placement on the right,dilated cardiomyopathy,tobacco abuse and has been a smoker for the last 35 years. She also has a history of right great saphenous vein ablation but the patient does not confirm this for  sure. She has had a venous duplex study done in August 2018 which showed no evidence of lower extremity deep or superficial venous thrombus or incompetence bilaterally. She has a arterial duplex study pending, which is going to be done later today. 12/22/16 on evaluation today patient appears to be doing fairly well in regard to her right lateral lower extremity wounds. She did undergo a right common femoral artery balloon angioplasty and stent placement which was performed on 12/17/16. Patient this is did appear to be a sufficient improvement according to the reviewed notes which is excellent news. Prior to this procedure patient did have a study of the lower extremity this was a Doppler performed on 12/08/16 this revealed moderate right lower extremity arterial disease involving the common femoral artery and iliac segment. There was also left arterial obstruction involving the iliac segment. On the left there was arterial obstruction involving the iliac segment, common femoral artery, and peroneal artery. Patient states she is still having some soreness left over and residual from the procedure. However overall she appears to be doing very well. She does have a follow-up study with vascular in the next week. 01/02/2017 -- was seen by Dr. Jacqulyn Cane -- she had an abdominal aortogram with lower extremity peripheral vascular intervention, and this showed moderate left common iliac artery stenosis. Short occlusion of the right common femoral artery with no significant infrainguinal disease. Short occlusion of the left common femoral artery with no significant infrainguinal disease and a successful drug-coated balloon angioplasty stent was placed in the right common femoral artery. She had a lower extremity arterial duplex examination done which showed the resting right ABI is within normal range and no evidence of significant right lower extremity arterial disease. right ABI is 1.08 with a toe  pressure being 0.54. there was biphasic flow. The left ABI was 0.77 and the toe and this was 0.38 with monophasic flow.The right toe brachial indicis were abnormal. The left resting ABI indicates moderate left lower extremity arterial disease in the left toe brachial index is abnormal. 01/09/17 on evaluation today patient presents with continued ulcerations noted of the right lower extremity. This does seem to be doing a little bit better but still is slough covered. She has been tolerating the dressing changes fairly well but states that the debridement does tend to bother her pain wise. No fevers, chills, nausea, or vomiting noted at this time. She does tell me that the alginate dressing that she  was looking to use following her last evaluation is expensive and insurance will not cover this it's actually cheaper for her to utilize the [place]. Kimberly [name], Kimberly [name] ([id number]) Electronic Signature(s) Signed: 01/11/2017 1:59:17 AM By: Worthy Keeler PA-C Entered By: Worthy Keeler on 01/09/2017 15:28:57 Kimberly [name] ([id number]) -------------------------------------------------------------------------------- Physical Exam Details Patient Name: Kimberly [name] Date of Service: 01/09/2017 1:45 PM Medical Record Number: [id number] Patient Account Number: [id number] Date of Birth/Sex: 1946/01/01 (71 y.o. Female) Treating RN: Roger Shelter Primary Care Provider: Cathlean Cower Other Clinician: Referring Provider: Cathlean Cower Treating Provider/Extender: Melburn Hake, HOYT Weeks in Treatment: 5 Constitutional Well-nourished and well-hydrated in no acute distress. Respiratory normal breathing without difficulty. Psychiatric this patient is able to make decisions and demonstrates good insight into disease process. Alert and Oriented x 3. pleasant and cooperative. Notes Patient did have slough noted over the wound surface throughout them very and scattered openings. I was able to sharply debride this  today although she did have discomfort she was able to put up with a lot of the debridement. Nonetheless I do believe that getting off even what we did is going to allow her to do even better with the [facility] treatment as far as slowly working off all of the slough. Electronic Signature(s) Signed: 01/11/2017 1:59:17 AM By: Worthy Keeler PA-C Entered By: Worthy Keeler on 01/09/2017 15:30:10 Kimberly [name] ([id number]) -------------------------------------------------------------------------------- Physician Orders Details Patient Name: Kimberly [name] Date of Service: 01/09/2017 1:45 PM Medical Record Number: [id number] Patient Account Number: [id number] Date of Birth/Sex: 1945/06/25 (71 y.o. Female) Treating RN: Roger Shelter Primary Care Provider: Cathlean Cower Other Clinician: Referring Provider: Cathlean Cower Treating Provider/Extender: Melburn Hake, HOYT Weeks in Treatment: 5 Verbal / Phone Orders: No Diagnosis Coding ICD-10 Coding Code Description [phone] Non-pressure chronic ulcer of right calf with fat layer exposed I89.0 Lymphedema, not elsewhere classified I73.9 Peripheral vascular disease, unspecified F17.218 Nicotine dependence, cigarettes, with other nicotine-induced disorders Wound Cleansing Wound #1 Right,Lateral Lower Leg o Clean wound with Normal Saline. o Cleanse wound with mild soap and water o May Shower, gently pat wound dry prior to applying new dressing. Wound #2 Right,Posterior Lower Leg o Clean wound with Normal Saline. o Cleanse wound with mild soap and water o May Shower, gently pat wound dry prior to applying new dressing. Anesthetic (add to Medication List) Wound #1 Right,Lateral Lower Leg o Topical Lidocaine 4% cream applied to wound bed prior to debridement (In Clinic Only). Wound #2 Right,Posterior Lower Leg o Topical Lidocaine 4% cream applied to wound bed prior to debridement (In Clinic Only). Primary Wound Dressing Wound #1  Right,Lateral Lower Leg o Santyl Ointment Wound #2 Right,Posterior Lower Leg o Santyl Ointment Secondary Dressing Wound #1 Right,Lateral Lower Leg o Conform/Kerlix o Non-adherent pad Wound #2 Right,Posterior Lower Leg o Conform/Kerlix o Non-adherent pad Dressing Change Frequency Kimberly [name], Kimberly B. ([id number]) Wound #1 Right,Lateral Lower Leg o Change dressing every day. Wound #2 Right,Posterior Lower Leg o Change dressing every day. Follow-up Appointments o Return Appointment in 2 weeks. Edema Control Wound #1 Right,Lateral Lower Leg o Patient to wear own compression stockings o Elevate legs to the level of the heart and pump ankles as often as possible Wound #2 Right,Posterior Lower Leg o Patient to wear own compression stockings o Elevate legs to the level of the heart and pump ankles as often as possible Additional Orders / Instructions Wound #1 Right,Lateral Lower Leg o Stop Smoking o Increase protein intake. Wound #2 Right,Posterior Lower Leg   o Stop Smoking o Increase protein intake. Medications-please add to medication list. Wound #1 Right,Lateral Lower Leg o Santyl Enzymatic Ointment o Other: - Vitamin C, Zinc, MVI Wound #2 Right,Posterior Lower Leg o Santyl Enzymatic Ointment o Other: - Vitamin C, Zinc, MVI Patient Medications Allergies: NKDA Notifications Medication Indication Start End Santyl 01/09/2017 DOSE topical 250 unit/gram ointment - ointment topical applied nickel thick to the wound bed and then covered with a dry dressing daily Electronic Signature(s) Signed: 01/09/2017 3:32:46 PM By: Worthy Keeler PA-C Entered By: Worthy Keeler on 01/09/2017 15:32:46 Kimberly [name] ([id number]) -------------------------------------------------------------------------------- Problem List Details Patient Name: Kimberly [name] Date of Service: 01/09/2017 1:45 PM Medical Record Number: [id number] Patient Account Number:  [id number] Date of Birth/Sex: 1945/07/18 (71 y.o. Female) Treating RN: Roger Shelter Primary Care Provider: Cathlean Cower Other Clinician: Referring Provider: Cathlean Cower Treating Provider/Extender: Melburn Hake, HOYT Weeks in Treatment: 5 Active Problems ICD-10 Encounter Code Description Active Date Diagnosis L97.212 Non-pressure chronic ulcer of right calf with fat layer exposed 12/05/2016 Yes I89.0 Lymphedema, not elsewhere classified 12/05/2016 Yes I73.9 Peripheral vascular disease, unspecified 12/05/2016 Yes F17.218 Nicotine dependence, cigarettes, with other nicotine-induced 12/05/2016 Yes disorders Inactive Problems Resolved Problems Electronic Signature(s) Signed: 01/11/2017 1:59:17 AM By: Worthy Keeler PA-C Entered By: Worthy Keeler on 01/09/2017 14:32:32 Kimberly [name] ([id number]) -------------------------------------------------------------------------------- Progress Note Details Patient Name: Kimberly [name] Date of Service: 01/09/2017 1:45 PM Medical Record Number: [id number] Patient Account Number: [id number] Date of Birth/Sex: 1945-10-29 (71 y.o. Female) Treating RN: Roger Shelter Primary Care Provider: Cathlean Cower Other Clinician: Referring Provider: Cathlean Cower Treating Provider/Extender: Melburn Hake, HOYT Weeks in Treatment: 5 Subjective Chief Complaint Information obtained from Patient Patient seen for complaints of Non-Healing Wound to the right lateral calf History of Present Illness (HPI) The following HPI elements were documented for the patient's wound: Location: right lateral calf Quality: Patient reports experiencing a sharp pain to affected area(s). Severity: Patient states wound are getting worse. Duration: Patient has had the wound for > 3 months prior to seeking treatment at the wound center Timing: Pain in wound is constant (hurts all the time) Context: The wound occurred when the patient had a injury to her leg with a sharp object which  caused a laceration Modifying Factors: Other treatment(s) tried include:of hydrogen peroxide, Neosporin and antibiotics Associated Signs and Symptoms: Patient reports having increase swelling. 71 year old patient was seen by the nurse practitioner at the [facility] primary care for a wound which has been there on her right leg since August 2018. For a period of time she has been treated with doxycycline, clindamycin and Bactrim and the wound continues to have drainage and odor.on 11/20/2016 she was again put on Bactrim DS and referred to the wound center. Past medical history significant for hypertension, congestive heart failure, COPD, dyslipidemia, migraines, pericarditis, status post VSD repair, cholecystectomy tubal ligation, cesarean section, coronary angiogram,my tenotomy with tube placement on the right,dilated cardiomyopathy,tobacco abuse and has been a smoker for the last 35 years. She also has a history of right great saphenous vein ablation but the patient does not confirm this for sure. She has had a venous duplex study done in August 2018 which showed no evidence of lower extremity deep or superficial venous thrombus or incompetence bilaterally. She has a arterial duplex study pending, which is going to be done later today. 12/22/16 on evaluation today patient appears to be doing fairly well in regard to her right lateral lower extremity wounds. She did undergo a  right common femoral artery balloon angioplasty and stent placement which was performed on 12/17/16. Patient this is did appear to be a sufficient improvement according to the reviewed notes which is excellent news. Prior to this procedure patient did have a study of the lower extremity this was a Doppler performed on 12/08/16 this revealed moderate right lower extremity arterial disease involving the common femoral artery and iliac segment. There was also left arterial obstruction involving the iliac segment. On the left there  was arterial obstruction involving the iliac segment, common femoral artery, and peroneal artery. Patient states she is still having some soreness left over and residual from the procedure. However overall she appears to be doing very well. She does have a follow-up study with vascular in the next week. 01/02/2017 -- was seen by Dr. Jacqulyn Cane -- she had an abdominal aortogram with lower extremity peripheral vascular intervention, and this showed moderate left common iliac artery stenosis. Short occlusion of the right common femoral artery with no significant infrainguinal disease. Short occlusion of the left common femoral artery with no significant infrainguinal disease and a successful drug-coated balloon angioplasty stent was placed in the right common femoral artery. She had a lower extremity arterial duplex examination done which showed the resting right ABI is within normal range and no evidence of significant right lower extremity arterial disease. right ABI is 1.08 with a toe pressure being 0.54. there was biphasic flow. The left ABI was 0.77 and the toe and this was 0.38 with monophasic flow.The right toe brachial indicis were abnormal. The left resting ABI indicates moderate left lower extremity arterial disease in the left toe brachial index is Kimberly [name], Kimberly [name] ([id number]) abnormal. 01/09/17 on evaluation today patient presents with continued ulcerations noted of the right lower extremity. This does seem to be doing a little bit better but still is slough covered. She has been tolerating the dressing changes fairly well but states that the debridement does tend to bother her pain wise. No fevers, chills, nausea, or vomiting noted at this time. She does tell me that the alginate dressing that she was looking to use following her last evaluation is expensive and insurance will not cover this it's actually cheaper for her to utilize the [place]. Patient History Information obtained from  Patient. Family History Cancer - Father, Heart Disease - Siblings, Hypertension - Mother, Tuberculosis - Father, No family history of Diabetes, Hereditary Spherocytosis, Kidney Disease, Lung Disease, Seizures, Stroke, Thyroid Problems. Social History Current every day smoker - 2-3 day, Marital Status - Married, Alcohol Use - Never, Drug Use - No History, Caffeine Use - Daily. Review of Systems (ROS) Constitutional Symptoms (General Health) Denies complaints or symptoms of Fever, Chills. Respiratory The patient has no complaints or symptoms. Cardiovascular Complains or has symptoms of LE edema. Objective Constitutional Well-nourished and well-hydrated in no acute distress. Vitals Time Taken: 2:14 AM, Height: 61 in, Weight: 111.9 lbs, BMI: 21.1, Temperature: 98.5 F, Pulse: 66 bpm, Respiratory Rate: 20 breaths/min, Blood Pressure: 100/63 mmHg. Respiratory normal breathing without difficulty. Psychiatric this patient is able to make decisions and demonstrates good insight into disease process. Alert and Oriented x 3. pleasant and cooperative. General Notes: Patient did have slough noted over the wound surface throughout them very and scattered openings. I was able to sharply debride this today although she did have discomfort she was able to put up with a lot of the debridement. Nonetheless I do believe that getting off even what we did is going to  allow her to do even better with the [facility] treatment as far as slowly working off all of the slough. Integumentary (Hair, Skin) Wound #1 status is Open. Original cause of wound was Trauma. The wound is located on the Right,Lateral Lower Leg. The [place] B. ([id number]) wound measures 1cm length x 0.5cm width x 0.1cm depth; 0.393cm^2 area and 0.039cm^3 volume. There is Fat Layer (Subcutaneous Tissue) Exposed exposed. There is no tunneling or undermining noted. There is a large amount of serous drainage noted. The wound margin is distinct  with the outline attached to the wound base. There is medium (34-66%) red, pink granulation within the wound bed. There is a medium (34-66%) amount of necrotic tissue within the wound bed including Adherent Slough. The periwound skin appearance exhibited: Excoriation, Induration, Maceration. The periwound skin appearance did not exhibit: Callus, Crepitus, Rash, Scarring, Dry/Scaly, Atrophie Blanche, Cyanosis, Ecchymosis, Hemosiderin Staining, Mottled, Pallor, Rubor, Erythema. Periwound temperature was noted as No Abnormality. The periwound has tenderness on palpation. Wound #2 status is Open. Original cause of wound was Trauma. The wound is located on the Right,Posterior Lower Leg. The wound measures 1.8cm length x 0.7cm width x 0.1cm depth; 0.99cm^2 area and 0.099cm^3 volume. There is Fat Layer (Subcutaneous Tissue) Exposed exposed. There is no tunneling or undermining noted. There is a medium amount of serous drainage noted. The wound margin is distinct with the outline attached to the wound base. There is medium (34-66%) red, pink granulation within the wound bed. There is a medium (34-66%) amount of necrotic tissue within the wound bed including Adherent Slough. The periwound skin appearance exhibited: Excoriation, Induration, Maceration. The periwound skin appearance did not exhibit: Callus, Crepitus, Rash, Scarring, Dry/Scaly, Atrophie Blanche, Cyanosis, Ecchymosis, Hemosiderin Staining, Mottled, Pallor, Rubor, Erythema. Periwound temperature was noted as No Abnormality. The periwound has tenderness on palpation. Assessment Active Problems ICD-10 L97.212 - Non-pressure chronic ulcer of right calf with fat layer exposed I89.0 - Lymphedema, not elsewhere classified I73.9 - Peripheral vascular disease, unspecified F17.218 - Nicotine dependence, cigarettes, with other nicotine-induced disorders Procedures Wound #1 Pre-procedure diagnosis of Wound #1 is a Trauma, Other located on the  Right,Lateral Lower Leg . There was a Skin/Subcutaneous Tissue Debridement ([id number]) debridement with total area of 0.5 sq cm performed by STONE III, HOYT E., PA-C. with the following instrument(s): Curette to remove Non-Viable tissue/material including Fibrin/Slough, Eschar, Skin, and Subcutaneous after achieving pain control using Other (lidocaine 4%). A time out was conducted at 02:30, prior to the start of the procedure. A Moderate amount of bleeding was controlled with Pressure. The procedure was tolerated well with a pain level of 0 throughout and a pain level of 0 following the procedure. Post Debridement Measurements: 1cm length x 0.5cm width x 0.2cm depth; 0.079cm^3 volume. Character of Wound/Ulcer Post Debridement is stable. Post procedure Diagnosis Wound #1: Same as Pre-Procedure Wound #2 Pre-procedure diagnosis of Wound #2 is a Trauma, Other located on the Right,Posterior Lower Leg . There was a Skin/Subcutaneous Tissue Debridement ([id number]) debridement with total area of 1.26 sq cm performed by STONE III, HOYT E., PA-C. with the following instrument(s): Curette to remove Non-Viable tissue/material including Fibrin/Slough, Eschar, Skin, and Subcutaneous after achieving pain control using Other (lidocaine 4%). A time out was conducted at 02:30, prior to the start of the procedure. A Moderate amount of bleeding was controlled with Pressure. The procedure was tolerated well with a pain level of 0 throughout and a pain level of 0 following the procedure. Post Debridement Measurements: 1.8cm Kimberly [name],  Kimberly B. ([id number]) length x 0.7cm width x 0.1cm depth; 0.099cm^3 volume. Character of Wound/Ulcer Post Debridement is stable. Post procedure Diagnosis Wound #2: Same as Pre-Procedure Plan Wound Cleansing: Wound #1 Right,Lateral Lower Leg: Clean wound with Normal Saline. Cleanse wound with mild soap and water May Shower, gently pat wound dry prior to applying new dressing. Wound  #2 Right,Posterior Lower Leg: Clean wound with Normal Saline. Cleanse wound with mild soap and water May Shower, gently pat wound dry prior to applying new dressing. Anesthetic (add to Medication List): Wound #1 Right,Lateral Lower Leg: Topical Lidocaine 4% cream applied to wound bed prior to debridement (In Clinic Only). Wound #2 Right,Posterior Lower Leg: Topical Lidocaine 4% cream applied to wound bed prior to debridement (In Clinic Only). Primary Wound Dressing: Wound #1 Right,Lateral Lower Leg: Santyl Ointment Wound #2 Right,Posterior Lower Leg: Santyl Ointment Secondary Dressing: Wound #1 Right,Lateral Lower Leg: Conform/Kerlix Non-adherent pad Wound #2 Right,Posterior Lower Leg: Conform/Kerlix Non-adherent pad Dressing Change Frequency: Wound #1 Right,Lateral Lower Leg: Change dressing every day. Wound #2 Right,Posterior Lower Leg: Change dressing every day. Follow-up Appointments: Return Appointment in 2 weeks. Edema Control: Wound #1 Right,Lateral Lower Leg: Patient to wear own compression stockings Elevate legs to the level of the heart and pump ankles as often as possible Wound #2 Right,Posterior Lower Leg: Patient to wear own compression stockings Elevate legs to the level of the heart and pump ankles as often as possible Additional Orders / Instructions: Wound #1 Right,Lateral Lower Leg: Stop Smoking Increase protein intake. Wound #2 Right,Posterior Lower Leg: Stop Smoking Increase protein intake. Medications-please add to medication list.: Kimberly [name], Kimberly [name] ([id number]) Wound #1 Right,Lateral Lower Leg: Santyl Enzymatic Ointment Other: - Vitamin C, Zinc, MVI Wound #2 Right,Posterior Lower Leg: Santyl Enzymatic Ointment Other: - Vitamin C, Zinc, MVI The following medication(s) was prescribed: Santyl topical 250 unit/gram ointment ointment topical applied nickel thick to the wound bed and then covered with a dry dressing daily starting 01/09/2017 I'm going  to recommend that we continue with the Santyl at this point and we will see were things stand following. Please see above for specific wound care orders. We will see patient for re-evaluation in 1 week(s) here in the clinic. If anything worsens or changes patient will contact our office for additional recommendations. Electronic Signature(s) Signed: 01/11/2017 1:59:17 AM By: Worthy Keeler PA-C Entered By: Worthy Keeler on 01/09/2017 15:32:57 Kimberly [name] ([id number]) -------------------------------------------------------------------------------- ROS/PFSH Details Patient Name: Kimberly [name] Date of Service: 01/09/2017 1:45 PM Medical Record Number: [id number] Patient Account Number: [id number] Date of Birth/Sex: 1945-07-09 (71 y.o. Female) Treating RN: Roger Shelter Primary Care Provider: Cathlean Cower Other Clinician: Referring Provider: Cathlean Cower Treating Provider/Extender: Melburn Hake, HOYT Weeks in Treatment: 5 Information Obtained From Patient Wound History Do you currently have one or more open woundso Yes How many open wounds do you currently haveo 1 Approximately how long have you had your woundso aug 2018 How have you been treating your wound(s) until nowo hydrogel ag Has your wound(s) ever healed and then re-openedo No Have you had any lab work done in the past montho No Have you tested positive for an antibiotic resistant organism (MRSA, VRE)o No Have you tested positive for osteomyelitis (bone infection)o No Have you had any tests for circulation on your legso No Have you had other problems associated with your woundso Swelling Constitutional Symptoms (General Health) Complaints and Symptoms: Negative for: Fever; Chills Cardiovascular Complaints and Symptoms: Positive for: LE edema Medical History: Positive  for: Congestive Heart Failure Hematologic/Lymphatic Medical History: Positive for: Anemia - hx Respiratory Complaints and Symptoms: No Complaints or  Symptoms Medical History: Positive for: Asthma; Chronic Obstructive Pulmonary Disease (COPD) Immunizations Pneumococcal Vaccine: Received Pneumococcal Vaccination: Yes Implantable Devices Family and Social History BINNIE, DROESSLER ([id number]) Cancer: Yes - Father; Diabetes: No; Heart Disease: Yes - Siblings; Hereditary Spherocytosis: No; Hypertension: Yes - Mother; Kidney Disease: No; Lung Disease: No; Seizures: No; Stroke: No; Thyroid Problems: No; Tuberculosis: Yes - Father; Current every day smoker - 2-3 day; Marital Status - Married; Alcohol Use: Never; Drug Use: No History; Caffeine Use: Daily; Financial Concerns: No; Food, Clothing or Shelter Needs: No; Support System Lacking: No; Transportation Concerns: No; Advanced Directives: No; Patient does not want information on Advanced Directives; Do not resuscitate: No; Living Will: No; Medical Power of Attorney: No Physician Affirmation I have reviewed and agree with the above information. Electronic Signature(s) Signed: 01/09/2017 4:21:05 PM By: Roger Shelter Signed: 01/11/2017 1:59:17 AM By: Worthy Keeler PA-C Entered By: Worthy Keeler on 01/09/2017 15:29:15 Kimberly [name] ([id number]) -------------------------------------------------------------------------------- SuperBill Details Patient Name: Kimberly [name] Date of Service: 01/09/2017 Medical Record Number: [id number] Patient Account Number: [id number] Date of Birth/Sex: [birth year] (71 y.o. Female) Treating RN: Roger Shelter Primary Care Provider: Cathlean Cower Other Clinician: Referring Provider: Cathlean Cower Treating Provider/Extender: Melburn Hake, HOYT Weeks in Treatment: 5 Diagnosis Coding ICD-10 Codes Code Description [phone] Non-pressure chronic ulcer of right calf with fat layer exposed I89.0 Lymphedema, not elsewhere classified I73.9 Peripheral vascular disease, unspecified F17.218 Nicotine dependence, cigarettes, with other nicotine-induced disorders Facility  Procedures CPT4 Code: 39767341 Description: 93790 - DEB SUBQ TISSUE 20 SQ CM/< ICD-10 Diagnosis Description L97.212 Non-pressure chronic ulcer of right calf with fat layer exp Modifier: osed Quantity: 1 Physician Procedures CPT4 Code: 2409735 Description: 32992 - WC PHYS SUBQ TISS 20 SQ CM ICD-10 Diagnosis Description L97.212 Non-pressure chronic ulcer of right calf with fat layer exp Modifier: osed Quantity: 1 Electronic Signature(s) Signed: 01/11/2017 1:59:17 AM By: Worthy Keeler PA-C Entered By: Worthy Keeler on 01/09/2017 15:33:06

## 2017-01-11 NOTE — Progress Notes (Signed)
Kimberly, [name] ([id number]) Visit Report for 01/09/2017 Arrival Information Details Patient Name: Kimberly [name], Kimberly [name] Date of Service: 01/09/2017 1:45 PM Medical Record Number: [id number] Patient Account Number: [id number] Date of Birth/Sex: 01/20/1946 (71 y.o. Female) Treating RN: Roger Shelter Primary Care Shakeyla Giebler: Cathlean Cower Other Clinician: Referring Kailyn Vanderslice: Cathlean Cower Treating Airlie Blumenberg/Extender: Melburn Hake, HOYT Weeks in Treatment: 5 Visit Information History Since Last Visit All ordered tests and consults were completed: No Patient Arrived: Ambulatory Added or deleted any medications: No Arrival Time: 14:14 Any new allergies or adverse reactions: No Accompanied By: husband Had a fall or experienced change in No Transfer Assistance: None activities of daily living that may affect Patient Identification Verified: Yes risk of falls: Secondary Verification Process Yes Signs or symptoms of abuse/neglect since last visito No Completed: Hospitalized since last visit: No Patient Requires Transmission-Based No Pain Present Now: No Precautions: Patient Has Alerts: Yes Patient Alerts: R ABI 0.62 from [facility] L ABI 0.72 from [facility] Electronic Signature(s) Signed: 01/09/2017 4:21:05 PM By: Roger Shelter Entered By: Roger Shelter on 01/09/2017 14:14:24 Kimberly [name] ([id number]) -------------------------------------------------------------------------------- Encounter Discharge Information Details Patient Name: Kimberly [name] Date of Service: 01/09/2017 1:45 PM Medical Record Number: [id number] Patient Account Number: [id number] Date of Birth/Sex: 10-25-1945 (71 y.o. Female) Treating RN: Roger Shelter Primary Care Mirna Sutcliffe: Cathlean Cower Other Clinician: Referring Matasha Smigelski: Cathlean Cower Treating Ralene Gasparyan/Extender: Melburn Hake, HOYT Weeks in Treatment: 5 Encounter Discharge Information Items Discharge Pain Level: 0 Discharge Condition: Stable Ambulatory Status:  Ambulatory Discharge Destination: Home Transportation: Private Auto Accompanied By: husband Schedule Follow-up Appointment: Yes Medication Reconciliation completed and No provided to Patient/Care Chakara Bognar: Patient Clinical Summary of Care: Declined Electronic Signature(s) Signed: 01/09/2017 4:21:05 PM By: Roger Shelter Entered By: Roger Shelter on 01/09/2017 14:59:35 Kimberly [name] ([id number]) -------------------------------------------------------------------------------- Lower Extremity Assessment Details Patient Name: Kimberly [name] Date of Service: 01/09/2017 1:45 PM Medical Record Number: [id number] Patient Account Number: [id number] Date of Birth/Sex: 1945/11/03 (71 y.o. Female) Treating RN: Roger Shelter Primary Care Alayzha An: Cathlean Cower Other Clinician: Referring Aleana Fifita: Cathlean Cower Treating Monica Zahler/Extender: Melburn Hake, HOYT Weeks in Treatment: 5 Edema Assessment Assessed: [Left: No] [Right: No] Edema: [Left: Ye] [Right: s] Vascular Assessment Claudication: Claudication Assessment [Right:None] Pulses: Dorsalis Pedis Palpable: [Right:Yes] Posterior Tibial Extremity colors, hair growth, and conditions: Extremity Color: [Right:Normal] Hair Growth on Extremity: [Right:Yes] Temperature of Extremity: [Right:Warm] Capillary Refill: [Right:< 3 seconds] Toe Nail Assessment Left: Right: Thick: No Discolored: No Deformed: No Improper Length and Hygiene: No Electronic Signature(s) Signed: 01/09/2017 4:21:05 PM By: Roger Shelter Entered By: Roger Shelter on 01/09/2017 14:24:02 Kimberly [name] ([id number]) -------------------------------------------------------------------------------- Multi Wound Chart Details Patient Name: Kimberly [name] Date of Service: 01/09/2017 1:45 PM Medical Record Number: [id number] Patient Account Number: [id number] Date of Birth/Sex: 1946-01-14 (71 y.o. Female) Treating RN: Roger Shelter Primary Care Gearold Wainer: Cathlean Cower Other Clinician: Referring Severo Beber: Cathlean Cower Treating Porchia Sinkler/Extender: Melburn Hake, HOYT Weeks in Treatment: 5 Vital Signs Height(in): 56 Pulse(bpm): 43 Weight(lbs): 111.9 Blood Pressure(mmHg): 100/63 Body Mass Index(BMI): 21 Temperature(F): 98.5 Respiratory Rate 20 (breaths/min): Photos: [1:No Photos] [2:No Photos] [N/A:N/A] Wound Location: [1:Right Lower Leg - Lateral] [2:Right Lower Leg - Posterior] [N/A:N/A] Wounding Event: [1:Trauma] [2:Trauma] [N/A:N/A] Primary Etiology: [1:Trauma, Other] [2:Trauma, Other] [N/A:N/A] Comorbid History: [1:Anemia, Asthma, Chronic Obstructive Pulmonary Disease (COPD), Congestive Heart Failure] [2:Anemia, Asthma, Chronic Obstructive Pulmonary Disease (COPD), Congestive Heart Failure] [N/A:N/A] Date Acquired: [1:09/03/2016] [2:09/03/2016] [N/A:N/A] Weeks of Treatment: [1:5] [2:5] [N/A:N/A] Wound Status: [1:Open] [2:Open] [N/A:N/A] Measurements L x W x D [1:1x0.5x0.1] [2:1.8x0.7x0.1] [N/A:N/A] (cm)  Area (cm) : [1:0.393] [2:0.99] [N/A:N/A] Volume (cm) : [1:0.039] [2:0.099] [N/A:N/A] % Reduction in Area: [1:64.30%] [2:34.40%] [N/A:N/A] % Reduction in Volume: [1:82.30%] [2:34.40%] [N/A:N/A] Classification: [1:Partial Thickness] [2:Partial Thickness] [N/A:N/A] Exudate Amount: [1:Large] [2:Medium] [N/A:N/A] Exudate Type: [1:Serous] [2:Serous] [N/A:N/A] Exudate Color: [1:amber] [2:amber] [N/A:N/A] Wound Margin: [1:Distinct, outline attached] [2:Distinct, outline attached] [N/A:N/A] Granulation Amount: [1:Medium (34-66%)] [2:Medium (34-66%)] [N/A:N/A] Granulation Quality: [1:Red, Pink] [2:Red, Pink] [N/A:N/A] Necrotic Amount: [1:Medium (34-66%)] [2:Medium (34-66%)] [N/A:N/A] Exposed Structures: [1:Fat Layer (Subcutaneous Tissue) Exposed: Yes] [2:Fat Layer (Subcutaneous Tissue) Exposed: Yes] [N/A:N/A] Epithelialization: [1:None] [2:Small (1-33%)] [N/A:N/A] Periwound Skin Texture: [1:Excoriation: Yes Induration: Yes Callus: No Crepitus:  No Rash: No Scarring: No] [2:Excoriation: Yes Induration: Yes Callus: No Crepitus: No Rash: No Scarring: No] [N/A:N/A] Periwound Skin Moisture: [1:Maceration: Yes Dry/Scaly: No] [2:Maceration: Yes Dry/Scaly: No] [N/A:N/A] Periwound Skin Color: [N/A:N/A] Atrophie Blanche: No Atrophie Blanche: No Cyanosis: No Cyanosis: No Ecchymosis: No Ecchymosis: No Erythema: No Erythema: No Hemosiderin Staining: No Hemosiderin Staining: No Mottled: No Mottled: No Pallor: No Pallor: No Rubor: No Rubor: No Temperature: No Abnormality No Abnormality N/A Tenderness on Palpation: Yes Yes N/A Wound Preparation: Ulcer Cleansing: Ulcer Cleansing: N/A Rinsed/Irrigated with Saline Rinsed/Irrigated with Saline Topical Anesthetic Applied: Topical Anesthetic Applied: Other: lidocaine 4% Other: lidocaine 4# Treatment Notes Electronic Signature(s) Signed: 01/09/2017 4:21:05 PM By: Roger Shelter Entered By: Roger Shelter on 01/09/2017 14:24:16 Kimberly [name] ([id number]) -------------------------------------------------------------------------------- Multi-Disciplinary Care Plan Details Patient Name: Kimberly [name] Date of Service: 01/09/2017 1:45 PM Medical Record Number: [id number] Patient Account Number: [id number] Date of Birth/Sex: 06-Apr-1945 (71 y.o. Female) Treating RN: Roger Shelter Primary Care Adler Alton: Cathlean Cower Other Clinician: Referring Amberlea Spagnuolo: Cathlean Cower Treating Taysean Wager/Extender: Melburn Hake, HOYT Weeks in Treatment: 5 Active Inactive ` Nutrition Nursing Diagnoses: Imbalanced nutrition Potential for alteratiion in Nutrition/Potential for imbalanced nutrition Goals: Patient/caregiver agrees to and verbalizes understanding of need to use nutritional supplements and/or vitamins as prescribed Date Initiated: 12/05/2016 Target Resolution Date: 02/28/2017 Goal Status: Active Interventions: Assess patient nutrition upon admission and as needed per  policy Notes: ` Orientation to the Wound Care Program Nursing Diagnoses: Knowledge deficit related to the wound healing center program Goals: Patient/caregiver will verbalize understanding of the [place] Program Date Initiated: 12/05/2016 Target Resolution Date: 12/27/2016 Goal Status: Active Interventions: Provide education on orientation to the wound center Notes: ` Pain, Acute or Chronic Nursing Diagnoses: Pain, acute or chronic: actual or potential Potential alteration in comfort, pain Goals: Patient/caregiver will verbalize adequate pain control between visits Date Initiated: 12/05/2016 Target Resolution Date: 03/28/2017 Goal Status: Active NICKOLETTE, ESPINOLA ([id number]) Interventions: Complete pain assessment as per visit requirements Notes: ` Wound/Skin Impairment Nursing Diagnoses: Impaired tissue integrity Knowledge deficit related to smoking impact on wound healing Knowledge deficit related to ulceration/compromised skin integrity Goals: Ulcer/skin breakdown will have a volume reduction of 80% by week 12 Date Initiated: 12/05/2016 Target Resolution Date: 03/28/2017 Goal Status: Active Interventions: Assess patient/caregiver ability to perform ulcer/skin care regimen upon admission and as needed Assess ulceration(s) every visit Provide education on smoking Notes: Electronic Signature(s) Signed: 01/09/2017 4:21:05 PM By: Roger Shelter Entered By: Roger Shelter on 01/09/2017 14:24:06 Kimberly [name] ([id number]) -------------------------------------------------------------------------------- Pain Assessment Details Patient Name: Kimberly [name] Date of Service: 01/09/2017 1:45 PM Medical Record Number: [id number] Patient Account Number: [id number] Date of Birth/Sex: [birth year] (71 y.o. Female) Treating RN: Roger Shelter Primary Care Teng Decou: Cathlean Cower Other Clinician: Referring Augustine Leverette: Cathlean Cower Treating Chace Bisch/Extender: Melburn Hake,  HOYT Weeks in Treatment: 5 Active Problems Location of Pain Severity and  Description of Pain Patient Has Paino No Site Locations Pain Management and Medication Current Pain Management: Electronic Signature(s) Signed: 01/09/2017 4:21:05 PM By: Roger Shelter Entered By: Roger Shelter on 01/09/2017 14:14:30 Kimberly [name] ([id number]) -------------------------------------------------------------------------------- Patient/Caregiver Education Details Patient Name: Kimberly [name] Date of Service: 01/09/2017 1:45 PM Medical Record Number: [id number] Patient Account Number: [id number] Date of Birth/Gender: [birth year] (71 y.o. Female) Treating RN: Roger Shelter Primary Care Physician: Cathlean Cower Other Clinician: Referring Physician: Cathlean Cower Treating Physician/Extender: Sharalyn Ink in Treatment: 5 Education Assessment Education Provided To: Patient and Caregiver Education Topics Provided Wound Debridement: Handouts: Wound Debridement Methods: Explain/Verbal Responses: State content correctly Wound/Skin Impairment: Handouts: Caring for Your Ulcer Methods: Explain/Verbal Responses: State content correctly Electronic Signature(s) Signed: 01/09/2017 4:21:05 PM By: Roger Shelter Entered By: Roger Shelter on 01/09/2017 14:59:51 Kimberly [name] ([id number]) -------------------------------------------------------------------------------- Wound Assessment Details Patient Name: Kimberly [name] Date of Service: 01/09/2017 1:45 PM Medical Record Number: [id number] Patient Account Number: [id number] Date of Birth/Sex: 05-21-1945 (71 y.o. Female) Treating RN: Roger Shelter Primary Care Finley Dinkel: Cathlean Cower Other Clinician: Referring Karter Haire: Cathlean Cower Treating Shakeera Rightmyer/Extender: Melburn Hake, HOYT Weeks in Treatment: 5 Wound Status Wound Number: 1 Primary Trauma, Other Etiology: Wound Location: Right Lower Leg - Lateral Wound Open Wounding Event:  Trauma Status: Date Acquired: 09/03/2016 Comorbid Anemia, Asthma, Chronic Obstructive Weeks Of Treatment: 5 History: Pulmonary Disease (COPD), Congestive Heart Clustered Wound: No Failure Photos Photo Uploaded By: Roger Shelter on 01/09/2017 16:24:06 Wound Measurements Length: (cm) 1 Width: (cm) 0.5 Depth: (cm) 0.1 Area: (cm) 0.393 Volume: (cm) 0.039 % Reduction in Area: 64.3% % Reduction in Volume: 82.3% Epithelialization: None Tunneling: No Undermining: No Wound Description Classification: Partial Thickness Wound Margin: Distinct, outline attached Exudate Amount: Large Exudate Type: Serous Exudate Color: amber Foul Odor After Cleansing: No Slough/Fibrino Yes Wound Bed Granulation Amount: Medium (34-66%) Exposed Structure Granulation Quality: Red, Pink Fat Layer (Subcutaneous Tissue) Exposed: Yes Necrotic Amount: Medium (34-66%) Necrotic Quality: Adherent Slough Periwound Skin Texture Texture Color No Abnormalities Noted: No No Abnormalities Noted: No Callus: No Atrophie Blanche: No PEIGHTON, MEHRA ([id number]) Crepitus: No Cyanosis: No Excoriation: Yes Ecchymosis: No Induration: Yes Erythema: No Rash: No Hemosiderin Staining: No Scarring: No Mottled: No Pallor: No Moisture Rubor: No No Abnormalities Noted: No Dry / Scaly: No Temperature / Pain Maceration: Yes Temperature: No Abnormality Tenderness on Palpation: Yes Wound Preparation Ulcer Cleansing: Rinsed/Irrigated with Saline Topical Anesthetic Applied: Other: lidocaine 4%, Treatment Notes Wound #1 (Right, Lateral Lower Leg) 1. Cleansed with: Clean wound with Normal Saline 2. Anesthetic Topical Lidocaine 4% cream to wound bed prior to debridement 4. Dressing Applied: Santyl Ointment 5. Secondary Dressing Applied Non-Adherent pad Notes kerlix and coban Electronic Signature(s) Signed: 01/09/2017 4:21:05 PM By: Roger Shelter Entered By: Roger Shelter on 01/09/2017  14:22:29 Kimberly [name] ([id number]) -------------------------------------------------------------------------------- Wound Assessment Details Patient Name: Kimberly [name] Date of Service: 01/09/2017 1:45 PM Medical Record Number: [id number] Patient Account Number: [id number] Date of Birth/Sex: [birth year] (71 y.o. Female) Treating RN: Roger Shelter Primary Care Molleigh Huot: Cathlean Cower Other Clinician: Referring Royden Bulman: Cathlean Cower Treating Leata Dominy/Extender: Melburn Hake, HOYT Weeks in Treatment: 5 Wound Status Wound Number: 2 Primary Trauma, Other Etiology: Wound Location: Right Lower Leg - Posterior Wound Open Wounding Event: Trauma Status: Date Acquired: 09/03/2016 Comorbid Anemia, Asthma, Chronic Obstructive Weeks Of Treatment: 5 History: Pulmonary Disease (COPD), Congestive Heart Clustered Wound: No Failure Photos Photo Uploaded By: Roger Shelter on 01/09/2017 16:24:16 Wound Measurements Length: (cm) 1.8 Width: (cm) 0.7 Depth: (cm)  0.1 Area: (cm) 0.99 Volume: (cm) 0.099 % Reduction in Area: 34.4% % Reduction in Volume: 34.4% Epithelialization: Small (1-33%) Tunneling: No Undermining: No Wound Description Classification: Partial Thickness Wound Margin: Distinct, outline attached Exudate Amount: Medium Exudate Type: Serous Exudate Color: amber Foul Odor After Cleansing: No Slough/Fibrino Yes Wound Bed Granulation Amount: Medium (34-66%) Exposed Structure Granulation Quality: Red, Pink Fat Layer (Subcutaneous Tissue) Exposed: Yes Necrotic Amount: Medium (34-66%) Necrotic Quality: Adherent Slough Periwound Skin Texture Texture Color No Abnormalities Noted: No No Abnormalities Noted: No Callus: No Atrophie Blanche: No FLEURETTE, WOOLBRIGHT ([id number]) Crepitus: No Cyanosis: No Excoriation: Yes Ecchymosis: No Induration: Yes Erythema: No Rash: No Hemosiderin Staining: No Scarring: No Mottled: No Pallor: No Moisture Rubor: No No Abnormalities  Noted: No Dry / Scaly: No Temperature / Pain Maceration: Yes Temperature: No Abnormality Tenderness on Palpation: Yes Wound Preparation Ulcer Cleansing: Rinsed/Irrigated with Saline Topical Anesthetic Applied: Other: lidocaine 4#, Treatment Notes Wound #2 (Right, Posterior Lower Leg) 1. Cleansed with: Clean wound with Normal Saline 2. Anesthetic Topical Lidocaine 4% cream to wound bed prior to debridement 4. Dressing Applied: Santyl Ointment 5. Secondary Dressing Applied Non-Adherent pad Notes kerlix and coban Electronic Signature(s) Signed: 01/09/2017 4:21:05 PM By: Roger Shelter Entered By: Roger Shelter on 01/09/2017 14:22:59 Kimberly [name] ([id number]) -------------------------------------------------------------------------------- Vitals Details Patient Name: Kimberly [name] Date of Service: 01/09/2017 1:45 PM Medical Record Number: [id number] Patient Account Number: [id number] Date of Birth/Sex: 03/30/1945 (71 y.o. Female) Treating RN: Roger Shelter Primary Care Cecilia Vancleve: Cathlean Cower Other Clinician: Referring Nasri Boakye: Cathlean Cower Treating Kenyetta Fife/Extender: Melburn Hake, HOYT Weeks in Treatment: 5 Vital Signs Time Taken: 02:14 Temperature (F): 98.5 Height (in): 61 Pulse (bpm): 66 Weight (lbs): 111.9 Respiratory Rate (breaths/min): 20 Body Mass Index (BMI): 21.1 Blood Pressure (mmHg): 100/63 Reference Range: 80 - 120 mg / dl Electronic Signature(s) Signed: 01/09/2017 4:21:05 PM By: Roger Shelter Entered By: Roger Shelter on 01/09/2017 14:14:49

## 2017-01-12 ENCOUNTER — Ambulatory Visit (HOSPITAL_COMMUNITY)
Admission: RE | Admit: 2017-01-12 | Discharge: 2017-01-12 | Disposition: A | Discharge status: Home / Self Care | Payer: Medicare Other | Source: Ambulatory Visit | Attending: Cardiology | Admitting: Cardiology

## 2017-01-12 DIAGNOSIS — I5022 Chronic systolic (congestive) heart failure: Secondary | ICD-10-CM | POA: Insufficient documentation

## 2017-01-12 LAB — PULMONARY FUNCTION TEST
DL/VA % PRED: 78 %
DL/VA: 3.47 ml/min/mmHg/L
DLCO UNC % PRED: 47 %
DLCO UNC: 9.68 ml/min/mmHg
FEF 25-75 POST: 0.53 L/s
FEF 25-75 PRE: 0.36 L/s
FEF2575-%Change-Post: 49 %
FEF2575-%PRED-POST: 30 %
FEF2575-%Pred-Pre: 20 %
FEV1-%CHANGE-POST: 16 %
FEV1-%Pred-Post: 44 %
FEV1-%Pred-Pre: 37 %
FEV1-Post: 0.88 L
FEV1-Pre: 0.76 L
FEV1FVC-%Change-Post: 4 %
FEV1FVC-%PRED-PRE: 68 %
FEV6-%CHANGE-POST: 11 %
FEV6-%PRED-POST: 63 %
FEV6-%Pred-Pre: 56 %
FEV6-Post: 1.59 L
FEV6-Pre: 1.43 L
FEV6FVC-%CHANGE-POST: 0 %
FEV6FVC-%Pred-Post: 102 %
FEV6FVC-%Pred-Pre: 103 %
FVC-%Change-Post: 11 %
FVC-%PRED-POST: 61 %
FVC-%Pred-Pre: 55 %
FVC-Post: 1.64 L
FVC-Pre: 1.46 L
POST FEV1/FVC RATIO: 54 %
PRE FEV6/FVC RATIO: 98 %
Post FEV6/FVC ratio: 97 %
Pre FEV1/FVC ratio: 52 %
RV % pred: 134 %
RV: 2.75 L
TLC % pred: 93 %
TLC: 4.3 L

## 2017-01-12 MED ORDER — ALBUTEROL SULFATE (2.5 MG/3ML) 0.083% IN NEBU
2.5000 mg | INHALATION_SOLUTION | Freq: Once | RESPIRATORY_TRACT | Status: AC
  Administered 2017-01-12: 2.5 mg via RESPIRATORY_TRACT

## 2017-01-15 ENCOUNTER — Ambulatory Visit: Payer: Medicare Other | Admitting: Cardiovascular Disease

## 2017-01-19 ENCOUNTER — Encounter (HOSPITAL_COMMUNITY): Payer: Medicare Other

## 2017-01-19 ENCOUNTER — Encounter: Payer: Self-pay | Admitting: *Deleted

## 2017-01-21 ENCOUNTER — Other Ambulatory Visit: Payer: Self-pay | Admitting: Internal Medicine

## 2017-01-21 NOTE — Telephone Encounter (Signed)
Done erx 

## 2017-01-23 ENCOUNTER — Encounter: Payer: Medicare Other | Attending: Physician Assistant | Admitting: Physician Assistant

## 2017-01-23 DIAGNOSIS — E785 Hyperlipidemia, unspecified: Secondary | ICD-10-CM | POA: Diagnosis not present

## 2017-01-23 DIAGNOSIS — L97212 Non-pressure chronic ulcer of right calf with fat layer exposed: Secondary | ICD-10-CM | POA: Diagnosis not present

## 2017-01-23 DIAGNOSIS — I739 Peripheral vascular disease, unspecified: Secondary | ICD-10-CM | POA: Insufficient documentation

## 2017-01-23 DIAGNOSIS — F17218 Nicotine dependence, cigarettes, with other nicotine-induced disorders: Secondary | ICD-10-CM | POA: Diagnosis not present

## 2017-01-23 DIAGNOSIS — I509 Heart failure, unspecified: Secondary | ICD-10-CM | POA: Diagnosis not present

## 2017-01-23 DIAGNOSIS — I110 Hypertensive heart disease with heart failure: Secondary | ICD-10-CM | POA: Insufficient documentation

## 2017-01-23 DIAGNOSIS — J449 Chronic obstructive pulmonary disease, unspecified: Secondary | ICD-10-CM | POA: Diagnosis not present

## 2017-01-23 DIAGNOSIS — I890 Lymphedema, not elsewhere classified: Secondary | ICD-10-CM | POA: Diagnosis not present

## 2017-01-24 NOTE — Progress Notes (Signed)
TAMMEY, DEEG ([id number]) Visit Report for 01/23/2017 Chief Complaint Document Details Patient Name: Kimberly [name], Kimberly [name] Date of Service: 01/23/2017 10:15 AM Medical Record Number: [id number] Patient Account Number: [id number] Date of Birth/Sex: Aug 06, 1945 (72 y.o. Female) Treating RN: Roger Shelter Primary Care Provider: Cathlean Cower Other Clinician: Referring Provider: Cathlean Cower Treating Provider/Extender: Melburn Hake, Piper Albro Weeks in Treatment: 7 Information Obtained from: Patient Chief Complaint Patient seen for complaints of Non-Healing Wound to the right lateral calf Electronic Signature(s) Signed: 01/23/2017 7:56:36 PM By: Worthy Keeler PA-C Entered By: Worthy Keeler on 01/23/2017 11:05:10 Kimberly [name] ([id number]) -------------------------------------------------------------------------------- Debridement Details Patient Name: Kimberly [name] Date of Service: 01/23/2017 10:15 AM Medical Record Number: [id number] Patient Account Number: [id number] Date of Birth/Sex: Apr 06, 1945 (72 y.o. Female) Treating RN: Roger Shelter Primary Care Provider: Cathlean Cower Other Clinician: Referring Provider: Cathlean Cower Treating Provider/Extender: Melburn Hake, Kyndall Amero Weeks in Treatment: 7 Debridement Performed for Wound #2 Right,Posterior Lower Leg Assessment: Performed By: Physician STONE III, Tayjon Halladay E., PA-C Debridement: Debridement Pre-procedure Verification/Time Yes - 11:21 Out Taken: Start Time: 11:21 Pain Control: Other : lidocaine 4% Level: Skin/Subcutaneous Tissue Total Area Debrided (L x W): 2.1 (cm) x 1.1 (cm) = 2.31 (cm) Tissue and other material Viable, Non-Viable, Fibrin/Slough, Skin, Subcutaneous debrided: Instrument: Curette Bleeding: Minimum Hemostasis Achieved: Pressure End Time: 11:22 Procedural Pain: 0 Post Procedural Pain: 0 Response to Treatment: Procedure was tolerated well Post Debridement Measurements of Total Wound Length: (cm) 2.1 Width: (cm) 1.1 Depth:  (cm) 0.2 Volume: (cm) 0.363 Character of Wound/Ulcer Post Debridement: Stable Post Procedure Diagnosis Same as Pre-procedure Electronic Signature(s) Signed: 01/23/2017 4:40:13 PM By: Roger Shelter Signed: 01/23/2017 7:56:36 PM By: Worthy Keeler PA-C Entered By: Roger Shelter on 01/23/2017 11:23:27 Kimberly [name] ([id number]) -------------------------------------------------------------------------------- HPI Details Patient Name: Kimberly [name] Date of Service: 01/23/2017 10:15 AM Medical Record Number: [id number] Patient Account Number: [id number] Date of Birth/Sex: November 17, 1945 (72 y.o. Female) Treating RN: Roger Shelter Primary Care Provider: Cathlean Cower Other Clinician: Referring Provider: Cathlean Cower Treating Provider/Extender: Melburn Hake, Stefon Ramthun Weeks in Treatment: 7 History of Present Illness Location: right lateral calf Quality: Patient reports experiencing a sharp pain to affected area(s). Severity: Patient states wound are getting worse. Duration: Patient has had the wound for > 3 months prior to seeking treatment at the wound center Timing: Pain in wound is constant (hurts all the time) Context: The wound occurred when the patient had a injury to her leg with a sharp object which caused a laceration Modifying Factors: Other treatment(s) tried include:of hydrogen peroxide, Neosporin and antibiotics Associated Signs and Symptoms: Patient reports having increase swelling. HPI Description: 72 year old patient was seen by the nurse practitioner at the [facility] primary care for a wound which has been there on her right leg since August 2018. For a period of time she has been treated with doxycycline, clindamycin and Bactrim and the wound continues to have drainage and odor.on 11/20/2016 she was again put on Bactrim DS and referred to the wound center. Past medical history significant for hypertension, congestive heart failure, COPD, dyslipidemia, migraines, pericarditis,  status post VSD repair, cholecystectomy tubal ligation, cesarean section, coronary angiogram,my tenotomy with tube placement on the right,dilated cardiomyopathy,tobacco abuse and has been a smoker for the last 35 years. She also has a history of right great saphenous vein ablation but the patient does not confirm this for sure. She has had a venous duplex study done in August 2018 which showed no evidence of lower extremity deep or superficial  venous thrombus or incompetence bilaterally. She has a arterial duplex study pending, which is going to be done later today. 12/22/16 on evaluation today patient appears to be doing fairly well in regard to her right lateral lower extremity wounds. She did undergo a right common femoral artery balloon angioplasty and stent placement which was performed on 12/17/16. Patient this is did appear to be a sufficient improvement according to the reviewed notes which is excellent news. Prior to this procedure patient did have a study of the lower extremity this was a Doppler performed on 12/08/16 this revealed moderate right lower extremity arterial disease involving the common femoral artery and iliac segment. There was also left arterial obstruction involving the iliac segment. On the left there was arterial obstruction involving the iliac segment, common femoral artery, and peroneal artery. Patient states she is still having some soreness left over and residual from the procedure. However overall she appears to be doing very well. She does have a follow-up study with vascular in the next week. 01/02/2017 -- was seen by Dr. Jacqulyn Cane -- she had an abdominal aortogram with lower extremity peripheral vascular intervention, and this showed moderate left common iliac artery stenosis. Short occlusion of the right common femoral artery with no significant infrainguinal disease. Short occlusion of the left common femoral artery with no significant  infrainguinal disease and a successful drug-coated balloon angioplasty stent was placed in the right common femoral artery. She had a lower extremity arterial duplex examination done which showed the resting right ABI is within normal range and no evidence of significant right lower extremity arterial disease. right ABI is 1.08 with a toe pressure being 0.54. there was biphasic flow. The left ABI was 0.77 and the toe and this was 0.38 with monophasic flow.The right toe brachial indicis were abnormal. The left resting ABI indicates moderate left lower extremity arterial disease in the left toe brachial index is abnormal. 01/09/17 on evaluation today patient presents with continued ulcerations noted of the right lower extremity. This does seem to be doing a little bit better but still is slough covered. She has been tolerating the dressing changes fairly well but states that the debridement does tend to bother her pain wise. No fevers, chills, nausea, or vomiting noted at this time. She does tell me that the alginate dressing that she was looking to use following her last evaluation is expensive and insurance will not cover this it's actually cheaper for her to utilize the [place]. Kimberly [name], Kimberly [name] ([id number]) 01/24/16 on evaluation today patient appears to be doing very well in regard to her right lower extremity ulcers. She has been tolerating the dressing changes fairly well we have been utilizing Santyl and her leg is doing much better. Overall she is extremely pleased with the progress that has been made. She still has some pain but it is minimal compared to what it has been in the past there is no evidence of infection. Electronic Signature(s) Signed: 01/23/2017 7:56:36 PM By: Worthy Keeler PA-C Entered By: Worthy Keeler on 01/23/2017 11:41:24 Kimberly [name] ([id number]) -------------------------------------------------------------------------------- Physical Exam Details Patient Name:  Kimberly [name] Date of Service: 01/23/2017 10:15 AM Medical Record Number: [id number] Patient Account Number: [id number] Date of Birth/Sex: 1945-11-01 (72 y.o. Female) Treating RN: Roger Shelter Primary Care Provider: Cathlean Cower Other Clinician: Referring Provider: Cathlean Cower Treating Provider/Extender: Melburn Hake, Kendale Rembold Weeks in Treatment: 7 Constitutional Well-nourished and well-hydrated in no acute distress. Respiratory normal breathing without difficulty. Psychiatric this patient  is able to make decisions and demonstrates good insight into disease process. Alert and Oriented x 3. pleasant and cooperative. Notes Patient does have several of the areas that are slough covered although I was able to completely remove the slough from most of these locations. Subsequently the larger area where she did have a section of slough covering I was able to remove the majority of although I do believe the Santyl still has some work to do. There is no evidence of infection which is excellent news. A curette was used to remove the necrotic tissue. Electronic Signature(s) Signed: 01/23/2017 7:56:36 PM By: Worthy Keeler PA-C Entered By: Worthy Keeler on 01/23/2017 11:42:57 Kimberly [name] ([id number]) -------------------------------------------------------------------------------- Physician Orders Details Patient Name: Kimberly [name] Date of Service: 01/23/2017 10:15 AM Medical Record Number: [id number] Patient Account Number: [id number] Date of Birth/Sex: 09-Mar-1945 (72 y.o. Female) Treating RN: Roger Shelter Primary Care Provider: Cathlean Cower Other Clinician: Referring Provider: Cathlean Cower Treating Provider/Extender: Melburn Hake, Loida Calamia Weeks in Treatment: 7 Verbal / Phone Orders: No Diagnosis Coding ICD-10 Coding Code Description [phone] Non-pressure chronic ulcer of right calf with fat layer exposed I89.0 Lymphedema, not elsewhere classified I73.9 Peripheral vascular disease,  unspecified F17.218 Nicotine dependence, cigarettes, with other nicotine-induced disorders Wound Cleansing Wound #1 Right,Lateral Lower Leg o Clean wound with Normal Saline. o Cleanse wound with mild soap and water o May Shower, gently pat wound dry prior to applying new dressing. Wound #2 Right,Posterior Lower Leg o Clean wound with Normal Saline. o Cleanse wound with mild soap and water o May Shower, gently pat wound dry prior to applying new dressing. Anesthetic (add to Medication List) Wound #1 Right,Lateral Lower Leg o Topical Lidocaine 4% cream applied to wound bed prior to debridement (In Clinic Only). Wound #2 Right,Posterior Lower Leg o Topical Lidocaine 4% cream applied to wound bed prior to debridement (In Clinic Only). Primary Wound Dressing Wound #1 Right,Lateral Lower Leg o Other: - non adherent dressing Wound #2 Right,Posterior Lower Leg o Santyl Ointment o Silvercel Non-Adherent Secondary Dressing Wound #1 Right,Lateral Lower Leg o Conform/Kerlix o Non-adherent pad Wound #2 Right,Posterior Lower Leg o Conform/Kerlix o Non-adherent pad [place], Sanja B. ([id number]) Dressing Change Frequency Wound #1 Right,Lateral Lower Leg o Change dressing every day. Wound #2 Right,Posterior Lower Leg o Change dressing every day. Follow-up Appointments o Return Appointment in 2 weeks. Edema Control Wound #1 Right,Lateral Lower Leg o Patient to wear own compression stockings o Elevate legs to the level of the heart and pump ankles as often as possible Wound #2 Right,Posterior Lower Leg o Patient to wear own compression stockings o Elevate legs to the level of the heart and pump ankles as often as possible Additional Orders / Instructions Wound #1 Right,Lateral Lower Leg o Stop Smoking o Increase protein intake. Wound #2 Right,Posterior Lower Leg o Stop Smoking o Increase protein intake. Medications-please add to  medication list. Wound #1 Right,Lateral Lower Leg o Santyl Enzymatic Ointment o Other: - Vitamin C, Zinc, MVI Wound #2 Right,Posterior Lower Leg o Santyl Enzymatic Ointment o Other: - Vitamin C, Zinc, MVI Electronic Signature(s) Signed: 01/23/2017 4:40:13 PM By: Roger Shelter Signed: 01/23/2017 7:56:36 PM By: Worthy Keeler PA-C Entered By: Roger Shelter on 01/23/2017 11:38:55 Kimberly [name] ([id number]) -------------------------------------------------------------------------------- Problem List Details Patient Name: Kimberly [name] Date of Service: 01/23/2017 10:15 AM Medical Record Number: [id number] Patient Account Number: [id number] Date of Birth/Sex: 03/28/1945 (72 y.o. Female) Treating RN: Roger Shelter Primary Care Provider: Cathlean Cower Other  Clinician: Referring Provider: Cathlean Cower Treating Provider/Extender: Melburn Hake, Jamera Vanloan Weeks in Treatment: 7 Active Problems ICD-10 Encounter Code Description Active Date Diagnosis L97.212 Non-pressure chronic ulcer of right calf with fat layer exposed 12/05/2016 Yes I89.0 Lymphedema, not elsewhere classified 12/05/2016 Yes I73.9 Peripheral vascular disease, unspecified 12/05/2016 Yes F17.218 Nicotine dependence, cigarettes, with other nicotine-induced 12/05/2016 Yes disorders Inactive Problems Resolved Problems Electronic Signature(s) Signed: 01/23/2017 7:56:36 PM By: Worthy Keeler PA-C Entered By: Worthy Keeler on 01/23/2017 11:05:02 Kimberly [name] ([id number]) -------------------------------------------------------------------------------- Progress Note Details Patient Name: Kimberly [name] Date of Service: 01/23/2017 10:15 AM Medical Record Number: [id number] Patient Account Number: [id number] Date of Birth/Sex: 09/17/1945 (72 y.o. Female) Treating RN: Roger Shelter Primary Care Provider: Cathlean Cower Other Clinician: Referring Provider: Cathlean Cower Treating Provider/Extender: Melburn Hake, Keedan Sample Weeks in  Treatment: 7 Subjective Chief Complaint Information obtained from Patient Patient seen for complaints of Non-Healing Wound to the right lateral calf History of Present Illness (HPI) The following HPI elements were documented for the patient's wound: Location: right lateral calf Quality: Patient reports experiencing a sharp pain to affected area(s). Severity: Patient states wound are getting worse. Duration: Patient has had the wound for > 3 months prior to seeking treatment at the wound center Timing: Pain in wound is constant (hurts all the time) Context: The wound occurred when the patient had a injury to her leg with a sharp object which caused a laceration Modifying Factors: Other treatment(s) tried include:of hydrogen peroxide, Neosporin and antibiotics Associated Signs and Symptoms: Patient reports having increase swelling. 72 year old patient was seen by the nurse practitioner at the [facility] primary care for a wound which has been there on her right leg since August 2018. For a period of time she has been treated with doxycycline, clindamycin and Bactrim and the wound continues to have drainage and odor.on 11/20/2016 she was again put on Bactrim DS and referred to the wound center. Past medical history significant for hypertension, congestive heart failure, COPD, dyslipidemia, migraines, pericarditis, status post VSD repair, cholecystectomy tubal ligation, cesarean section, coronary angiogram,my tenotomy with tube placement on the right,dilated cardiomyopathy,tobacco abuse and has been a smoker for the last 35 years. She also has a history of right great saphenous vein ablation but the patient does not confirm this for sure. She has had a venous duplex study done in August 2018 which showed no evidence of lower extremity deep or superficial venous thrombus or incompetence bilaterally. She has a arterial duplex study pending, which is going to be done later today. 12/22/16 on  evaluation today patient appears to be doing fairly well in regard to her right lateral lower extremity wounds. She did undergo a right common femoral artery balloon angioplasty and stent placement which was performed on 12/17/16. Patient this is did appear to be a sufficient improvement according to the reviewed notes which is excellent news. Prior to this procedure patient did have a study of the lower extremity this was a Doppler performed on 12/08/16 this revealed moderate right lower extremity arterial disease involving the common femoral artery and iliac segment. There was also left arterial obstruction involving the iliac segment. On the left there was arterial obstruction involving the iliac segment, common femoral artery, and peroneal artery. Patient states she is still having some soreness left over and residual from the procedure. However overall she appears to be doing very well. She does have a follow-up study with vascular in the next week. 01/02/2017 -- was seen by Dr. Jacqulyn Cane --  she had an abdominal aortogram with lower extremity peripheral vascular intervention, and this showed moderate left common iliac artery stenosis. Short occlusion of the right common femoral artery with no significant infrainguinal disease. Short occlusion of the left common femoral artery with no significant infrainguinal disease and a successful drug-coated balloon angioplasty stent was placed in the right common femoral artery. She had a lower extremity arterial duplex examination done which showed the resting right ABI is within normal range and no evidence of significant right lower extremity arterial disease. right ABI is 1.08 with a toe pressure being 0.54. there was biphasic flow. The left ABI was 0.77 and the toe and this was 0.38 with monophasic flow.The right toe brachial indicis were abnormal. The left resting ABI indicates moderate left lower extremity arterial disease in the left toe  brachial index is Kimberly [name], Kimberly [name] ([id number]) abnormal. 01/09/17 on evaluation today patient presents with continued ulcerations noted of the right lower extremity. This does seem to be doing a little bit better but still is slough covered. She has been tolerating the dressing changes fairly well but states that the debridement does tend to bother her pain wise. No fevers, chills, nausea, or vomiting noted at this time. She does tell me that the alginate dressing that she was looking to use following her last evaluation is expensive and insurance will not cover this it's actually cheaper for her to utilize the [employer]. 01/24/16 on evaluation today patient appears to be doing very well in regard to her right lower extremity ulcers. She has been tolerating the dressing changes fairly well we have been utilizing Santyl and her leg is doing much better. Overall she is extremely pleased with the progress that has been made. She still has some pain but it is minimal compared to what it has been in the past there is no evidence of infection. Patient History Information obtained from Patient. Family History Cancer - Father, Heart Disease - Siblings, Hypertension - Mother, Tuberculosis - Father, No family history of Diabetes, Hereditary Spherocytosis, Kidney Disease, Lung Disease, Seizures, Stroke, Thyroid Problems. Social History Current every day smoker - 2-3 day, Marital Status - Married, Alcohol Use - Never, Drug Use - No History, Caffeine Use - Daily. Review of Systems (ROS) Constitutional Symptoms (General Health) Denies complaints or symptoms of Fever, Chills. Respiratory The patient has no complaints or symptoms. Cardiovascular The patient has no complaints or symptoms. Psychiatric The patient has no complaints or symptoms. Objective Constitutional Well-nourished and well-hydrated in no acute distress. Vitals Time Taken: 10:48 AM, Height: 61 in, Weight: 111.9 lbs, BMI: 21.1, Temperature:  97.9 F, Pulse: 60 bpm, Respiratory Rate: 20 breaths/min, Blood Pressure: 109/56 mmHg. Respiratory normal breathing without difficulty. Psychiatric this patient is able to make decisions and demonstrates good insight into disease process. Alert and Oriented x 3. pleasant and cooperative. Kimberly [name], Kimberly [name] ([id number]) General Notes: Patient does have several of the areas that are slough covered although I was able to completely remove the slough from most of these locations. Subsequently the larger area where she did have a section of slough covering I was able to remove the majority of although I do believe the Santyl still has some work to do. There is no evidence of infection which is excellent news. A curette was used to remove the necrotic tissue. Integumentary (Hair, Skin) Wound #1 status is Open. Original cause of wound was Trauma. The wound is located on the Right,Lateral Lower Leg. The wound measures 0.3cm length x 0.2cm  width x 0.1cm depth; 0.047cm^2 area and 0.005cm^3 volume. There is no tunneling or undermining noted. There is a none present amount of drainage noted. The wound margin is flat and intact. There is small (1-33%) red granulation within the wound bed. There is a large (67-100%) amount of necrotic tissue within the wound bed including Eschar. The periwound skin appearance exhibited: Excoriation, Dry/Scaly. The periwound skin appearance did not exhibit: Callus, Crepitus, Induration, Rash, Scarring, Maceration, Atrophie Blanche, Cyanosis, Ecchymosis, Hemosiderin Staining, Mottled, Pallor, Rubor, Erythema. Periwound temperature was noted as No Abnormality. The periwound has tenderness on palpation. Wound #2 status is Open. Original cause of wound was Trauma. The wound is located on the Right,Posterior Lower Leg. The wound measures 2.1cm length x 1.1cm width x 0.1cm depth; 1.814cm^2 area and 0.181cm^3 volume. There is Fat Layer (Subcutaneous Tissue) Exposed exposed. There is  no tunneling or undermining noted. There is a medium amount of serous drainage noted. The wound margin is distinct with the outline attached to the wound base. There is medium (34-66%) red, pink granulation within the wound bed. There is a medium (34-66%) amount of necrotic tissue within the wound bed including Adherent Slough. The periwound skin appearance exhibited: Excoriation, Induration, Maceration. The periwound skin appearance did not exhibit: Callus, Crepitus, Rash, Scarring, Dry/Scaly, Atrophie Blanche, Cyanosis, Ecchymosis, Hemosiderin Staining, Mottled, Pallor, Rubor, Erythema. Periwound temperature was noted as No Abnormality. The periwound has tenderness on palpation. Assessment Active Problems ICD-10 L97.212 - Non-pressure chronic ulcer of right calf with fat layer exposed I89.0 - Lymphedema, not elsewhere classified I73.9 - Peripheral vascular disease, unspecified F17.218 - Nicotine dependence, cigarettes, with other nicotine-induced disorders Procedures Wound #2 Pre-procedure diagnosis of Wound #2 is a Trauma, Other located on the Right,Posterior Lower Leg . There was a Skin/Subcutaneous Tissue Debridement ([id number]) debridement with total area of 2.31 sq cm performed by STONE III, Amiley Shishido E., PA-C. with the following instrument(s): Curette to remove Viable and Non-Viable tissue/material including Fibrin/Slough, Skin, and Subcutaneous after achieving pain control using Other (lidocaine 4%). A time out was conducted at 11:21, prior to the start of the procedure. A Minimum amount of bleeding was controlled with Pressure. The procedure was tolerated well with a pain level of 0 throughout and a pain level of 0 following the procedure. Post Debridement Measurements: 2.1cm length x 1.1cm width x 0.2cm depth; 0.363cm^3 volume. Character of Wound/Ulcer Post Debridement is stable. Post procedure Diagnosis Wound #2: Same as Pre-Procedure Kimberly [name], Kimberly B. ([id number]) Plan Wound  Cleansing: Wound #1 Right,Lateral Lower Leg: Clean wound with Normal Saline. Cleanse wound with mild soap and water May Shower, gently pat wound dry prior to applying new dressing. Wound #2 Right,Posterior Lower Leg: Clean wound with Normal Saline. Cleanse wound with mild soap and water May Shower, gently pat wound dry prior to applying new dressing. Anesthetic (add to Medication List): Wound #1 Right,Lateral Lower Leg: Topical Lidocaine 4% cream applied to wound bed prior to debridement (In Clinic Only). Wound #2 Right,Posterior Lower Leg: Topical Lidocaine 4% cream applied to wound bed prior to debridement (In Clinic Only). Primary Wound Dressing: Wound #1 Right,Lateral Lower Leg: Other: - non adherent dressing Wound #2 Right,Posterior Lower Leg: Santyl Ointment Silvercel Non-Adherent Secondary Dressing: Wound #1 Right,Lateral Lower Leg: Conform/Kerlix Non-adherent pad Wound #2 Right,Posterior Lower Leg: Conform/Kerlix Non-adherent pad Dressing Change Frequency: Wound #1 Right,Lateral Lower Leg: Change dressing every day. Wound #2 Right,Posterior Lower Leg: Change dressing every day. Follow-up Appointments: Return Appointment in 2 weeks. Edema Control: Wound #1 Right,Lateral Lower Leg:  Patient to wear own compression stockings Elevate legs to the level of the heart and pump ankles as often as possible Wound #2 Right,Posterior Lower Leg: Patient to wear own compression stockings Elevate legs to the level of the heart and pump ankles as often as possible Additional Orders / Instructions: Wound #1 Right,Lateral Lower Leg: Stop Smoking Increase protein intake. Wound #2 Right,Posterior Lower Leg: Stop Smoking Increase protein intake. Medications-please add to medication list.: Wound #1 Right,Lateral Lower Leg: Santyl Enzymatic Ointment Other: - Vitamin C, Zinc, MVI Wound #2 Right,Posterior Lower Leg: Santyl Enzymatic Ointment Kimberly [name], Kimberly [name] ([id number]) Other: -  Vitamin C, Zinc, MVI I'm going to recommend that we continue with the Santyl for one more week although I think we are very close to not even eating that anymore. We will apply the silver alginate dressing here in the office for her to leave on today's then remove due to some the bleeding that she is having. She will then go back to the dressings that she utilizes at home which includes a [place] pad secured to the lower extremity. This is then wrapped with Kerlex. Hopefully she will continue to show signs of good improvement. Please see above for specific wound care orders. We will see patient for re-evaluation in 1 week(s) here in the clinic. If anything worsens or changes patient will contact our office for additional recommendations. Electronic Signature(s) Signed: 01/23/2017 7:56:36 PM By: Worthy Keeler PA-C Entered By: Worthy Keeler on 01/23/2017 11:43:44 Kimberly [name] ([id number]) -------------------------------------------------------------------------------- ROS/PFSH Details Patient Name: Kimberly [name] Date of Service: 01/23/2017 10:15 AM Medical Record Number: [id number] Patient Account Number: [id number] Date of Birth/Sex: [birth year] (72 y.o. Female) Treating RN: Roger Shelter Primary Care Provider: Cathlean Cower Other Clinician: Referring Provider: Cathlean Cower Treating Provider/Extender: Melburn Hake, Bartolo Montanye Weeks in Treatment: 7 Information Obtained From Patient Wound History Do you currently have one or more open woundso Yes How many open wounds do you currently haveo 1 Approximately how long have you had your woundso aug 2018 How have you been treating your wound(s) until nowo hydrogel ag Has your wound(s) ever healed and then re-openedo No Have you had any lab work done in the past montho No Have you tested positive for an antibiotic resistant organism (MRSA, VRE)o No Have you tested positive for osteomyelitis (bone infection)o No Have you had any tests for circulation on  your legso No Have you had other problems associated with your woundso Swelling Constitutional Symptoms (General Health) Complaints and Symptoms: Negative for: Fever; Chills Hematologic/Lymphatic Medical History: Positive for: Anemia - hx Respiratory Complaints and Symptoms: No Complaints or Symptoms Medical History: Positive for: Asthma; Chronic Obstructive Pulmonary Disease (COPD) Cardiovascular Complaints and Symptoms: No Complaints or Symptoms Medical History: Positive for: Congestive Heart Failure Psychiatric Complaints and Symptoms: No Complaints or Symptoms Immunizations Pneumococcal Vaccine: Received Pneumococcal Vaccination: Yes Kimberly [name], Kimberly [name] ([id number]) Implantable Devices Family and Social History Cancer: Yes - Father; Diabetes: No; Heart Disease: Yes - Siblings; Hereditary Spherocytosis: No; Hypertension: Yes - Mother; Kidney Disease: No; Lung Disease: No; Seizures: No; Stroke: No; Thyroid Problems: No; Tuberculosis: Yes - Father; Current every day smoker - 2-3 day; Marital Status - Married; Alcohol Use: Never; Drug Use: No History; Caffeine Use: Daily; Financial Concerns: No; Food, Clothing or Shelter Needs: No; Support System Lacking: No; Transportation Concerns: No; Advanced Directives: No; Patient does not want information on Advanced Directives; Do not resuscitate: No; Living Will: No; Medical Power of Attorney: No Physician Affirmation I have reviewed  and agree with the above information. Electronic Signature(s) Signed: 01/23/2017 4:40:13 PM By: Roger Shelter Signed: 01/23/2017 7:56:36 PM By: Worthy Keeler PA-C Entered By: Worthy Keeler on 01/23/2017 11:42:00 Kimberly [name] ([id number]) -------------------------------------------------------------------------------- SuperBill Details Patient Name: Kimberly [name] Date of Service: 01/23/2017 Medical Record Number: [id number] Patient Account Number: [id number] Date of Birth/Sex: [birth year] (71 y.o.  Female) Treating RN: Roger Shelter Primary Care Provider: Cathlean Cower Other Clinician: Referring Provider: Cathlean Cower Treating Provider/Extender: Melburn Hake, Roma Bierlein Weeks in Treatment: 7 Diagnosis Coding ICD-10 Codes Code Description [phone] Non-pressure chronic ulcer of right calf with fat layer exposed I89.0 Lymphedema, not elsewhere classified I73.9 Peripheral vascular disease, unspecified F17.218 Nicotine dependence, cigarettes, with other nicotine-induced disorders Facility Procedures CPT4 Code: [id number] Description: 32023 - DEB SUBQ TISSUE 20 SQ CM/< ICD-10 Diagnosis Description L97.212 Non-pressure chronic ulcer of right calf with fat layer exp Modifier: osed Quantity: 1 Physician Procedures CPT4 Code: [id number] Description: 16837 - WC PHYS SUBQ TISS 20 SQ CM ICD-10 Diagnosis Description L97.212 Non-pressure chronic ulcer of right calf with fat layer exp Modifier: osed Quantity: 1 Electronic Signature(s) Signed: 01/23/2017 7:56:36 PM By: Worthy Keeler PA-C Entered By: Worthy Keeler on 01/23/2017 11:44:02

## 2017-01-24 NOTE — Progress Notes (Signed)
ARALYN, NOWAK ([id number]) Visit Report for 01/23/2017 Arrival Information Details Patient Name: Kimberly [name], Kimberly [name] Date of Service: 01/23/2017 10:15 AM Medical Record Number: [id number] Patient Account Number: [id number] Date of Birth/Sex: 12/26/1945 (72 y.o. Female) Treating RN: Roger Shelter Primary Care Anaelle Dunton: Cathlean Cower Other Clinician: Referring Mckay Brandt: Cathlean Cower Treating Keajah Killough/Extender: Melburn Hake, HOYT Weeks in Treatment: 7 Visit Information History Since Last Visit All ordered tests and consults were completed: No Patient Arrived: Ambulatory Added or deleted any medications: No Arrival Time: 10:47 Any new allergies or adverse reactions: No Accompanied By: husband Had a fall or experienced change in No Transfer Assistance: None activities of daily living that may affect Patient Requires Transmission-Based No risk of falls: Precautions: Signs or symptoms of abuse/neglect since last visito No Patient Has Alerts: Yes Hospitalized since last visit: No Patient Alerts: R ABI 0.62 from Pain Present Now: No EPIC L ABI 0.72 from [facility] Electronic Signature(s) Signed: 01/23/2017 4:40:13 PM By: Roger Shelter Entered By: Roger Shelter on 01/23/2017 10:48:09 Kimberly [name] ([id number]) -------------------------------------------------------------------------------- Encounter Discharge Information Details Patient Name: Kimberly [name] Date of Service: 01/23/2017 10:15 AM Medical Record Number: [id number] Patient Account Number: [id number] Date of Birth/Sex: 1945/08/16 (72 y.o. Female) Treating RN: Roger Shelter Primary Care Mataya Kilduff: Cathlean Cower Other Clinician: Referring Salene Mohamud: Cathlean Cower Treating Ravon Mcilhenny/Extender: Melburn Hake, HOYT Weeks in Treatment: 7 Encounter Discharge Information Items Discharge Pain Level: 0 Discharge Condition: Stable Ambulatory Status: Ambulatory Discharge Destination: Home Transportation: Private Auto Accompanied By: husband Schedule  Follow-up Appointment: Yes Medication Reconciliation completed and No provided to Patient/Care Neo Yepiz: Patient Clinical Summary of Care: Declined Electronic Signature(s) Signed: 01/23/2017 3:03:52 PM By: Roger Shelter Entered By: Roger Shelter on 01/23/2017 15:03:52 Kimberly [name] ([id number]) -------------------------------------------------------------------------------- Lower Extremity Assessment Details Patient Name: Kimberly [name] Date of Service: 01/23/2017 10:15 AM Medical Record Number: [id number] Patient Account Number: [id number] Date of Birth/Sex: Sep 21, 1945 (72 y.o. Female) Treating RN: Roger Shelter Primary Care Amanee Iacovelli: Cathlean Cower Other Clinician: Referring Courtney Fenlon: Cathlean Cower Treating Zykeem Bauserman/Extender: Melburn Hake, HOYT Weeks in Treatment: 7 Edema Assessment Assessed: [Left: No] [Right: No] Edema: [Left: Ye] [Right: s] Vascular Assessment Claudication: Claudication Assessment [Right:None] Pulses: Dorsalis Pedis Palpable: [Right:Yes] Posterior Tibial Extremity colors, hair growth, and conditions: Extremity Color: [Right:Hyperpigmented] Hair Growth on Extremity: [Right:Yes] Temperature of Extremity: [Right:Cool] Capillary Refill: [Right:< 3 seconds] Toe Nail Assessment Left: Right: Thick: No Discolored: No Deformed: No Improper Length and Hygiene: Yes Electronic Signature(s) Signed: 01/23/2017 4:40:13 PM By: Roger Shelter Entered By: Roger Shelter on 01/23/2017 11:00:00 Kimberly [name] ([id number]) -------------------------------------------------------------------------------- Multi Wound Chart Details Patient Name: Kimberly [name] Date of Service: 01/23/2017 10:15 AM Medical Record Number: [id number] Patient Account Number: [id number] Date of Birth/Sex: Jul 21, 1945 (72 y.o. Female) Treating RN: Roger Shelter Primary Care Xzavian Semmel: Cathlean Cower Other Clinician: Referring Darra Rosa: Cathlean Cower Treating Enis Leatherwood/Extender: Melburn Hake,  HOYT Weeks in Treatment: 7 Vital Signs Height(in): 61 Pulse(bpm): 60 Weight(lbs): 111.9 Blood Pressure(mmHg): 109/56 Body Mass Index(BMI): 21 Temperature(F): 97.9 Respiratory Rate 20 (breaths/min): Photos: [1:No Photos] [2:No Photos] [N/A:N/A] Wound Location: [1:Right Lower Leg - Lateral] [2:Right Lower Leg - Posterior] [N/A:N/A] Wounding Event: [1:Trauma] [2:Trauma] [N/A:N/A] Primary Etiology: [1:Trauma, Other] [2:Trauma, Other] [N/A:N/A] Comorbid History: [1:Anemia, Asthma, Chronic Obstructive Pulmonary Disease (COPD), Congestive Heart Failure] [2:Anemia, Asthma, Chronic Obstructive Pulmonary Disease (COPD), Congestive Heart Failure] [N/A:N/A] Date Acquired: [1:09/03/2016] [2:09/03/2016] [N/A:N/A] Weeks of Treatment: [1:7] [2:7] [N/A:N/A] Wound Status: [1:Open] [2:Open] [N/A:N/A] Measurements L x W x D [1:0.3x0.2x0.1] [2:2.1x1.1x0.1] [N/A:N/A] (cm) Area (cm) : [1:0.047] [2:1.814] [N/A:N/A] Volume (cm) : [  1:0.005] [2:0.181] [N/A:N/A] % Reduction in Area: [1:95.70%] [2:-20.30%] [N/A:N/A] % Reduction in Volume: [1:97.70%] [2:-19.90%] [N/A:N/A] Classification: [1:Partial Thickness] [2:Partial Thickness] [N/A:N/A] Exudate Amount: [1:None Present] [2:Medium] [N/A:N/A] Exudate Type: [1:N/A] [2:Serous] [N/A:N/A] Exudate Color: [1:N/A] [2:amber] [N/A:N/A] Wound Margin: [1:Flat and Intact] [2:Distinct, outline attached] [N/A:N/A] Granulation Amount: [1:Small (1-33%)] [2:Medium (34-66%)] [N/A:N/A] Granulation Quality: [1:Red] [2:Red, Pink] [N/A:N/A] Necrotic Amount: [1:Large (67-100%)] [2:Medium (34-66%)] [N/A:N/A] Necrotic Tissue: [1:Eschar] [2:Adherent Slough] [N/A:N/A] Exposed Structures: [1:Fascia: No Fat Layer (Subcutaneous Tissue) Exposed: No Tendon: No Muscle: No Joint: No Bone: No] [2:Fat Layer (Subcutaneous Tissue) Exposed: Yes] [N/A:N/A] Epithelialization: [1:Large (67-100%)] [2:Small (1-33%)] [N/A:N/A] Periwound Skin Texture: [1:Excoriation: Yes Induration: No Callus: No]  [2:Excoriation: Yes Induration: Yes Callus: No] [N/A:N/A] Crepitus: No Crepitus: No Rash: No Rash: No Scarring: No Scarring: No Periwound Skin Moisture: Dry/Scaly: Yes Maceration: Yes N/A Maceration: No Dry/Scaly: No Periwound Skin Color: Atrophie Blanche: No Atrophie Blanche: No N/A Cyanosis: No Cyanosis: No Ecchymosis: No Ecchymosis: No Erythema: No Erythema: No Hemosiderin Staining: No Hemosiderin Staining: No Mottled: No Mottled: No Pallor: No Pallor: No Rubor: No Rubor: No Temperature: No Abnormality No Abnormality N/A Tenderness on Palpation: Yes Yes N/A Wound Preparation: Ulcer Cleansing: Ulcer Cleansing: N/A Rinsed/Irrigated with Saline Rinsed/Irrigated with Saline Topical Anesthetic Applied: Topical Anesthetic Applied: Other: lidocaine 4% Other: lidocaine 4# Treatment Notes Electronic Signature(s) Signed: 01/23/2017 4:40:13 PM By: Roger Shelter Entered By: Roger Shelter on 01/23/2017 11:12:04 Kimberly [name] ([id number]) -------------------------------------------------------------------------------- Multi-Disciplinary Care Plan Details Patient Name: Kimberly [name] Date of Service: 01/23/2017 10:15 AM Medical Record Number: [id number] Patient Account Number: [id number] Date of Birth/Sex: Apr 06, 1945 (72 y.o. Female) Treating RN: Roger Shelter Primary Care Raef Sprigg: Cathlean Cower Other Clinician: Referring Adriahna Shearman: Cathlean Cower Treating Ani Deoliveira/Extender: Melburn Hake, HOYT Weeks in Treatment: 7 Active Inactive ` Nutrition Nursing Diagnoses: Imbalanced nutrition Potential for alteratiion in Nutrition/Potential for imbalanced nutrition Goals: Patient/caregiver agrees to and verbalizes understanding of need to use nutritional supplements and/or vitamins as prescribed Date Initiated: 12/05/2016 Target Resolution Date: 02/28/2017 Goal Status: Active Interventions: Assess patient nutrition upon admission and as needed per policy Notes: ` Orientation  to the Wound Care Program Nursing Diagnoses: Knowledge deficit related to the wound healing center program Goals: Patient/caregiver will verbalize understanding of the [place] Program Date Initiated: 12/05/2016 Target Resolution Date: 12/27/2016 Goal Status: Active Interventions: Provide education on orientation to the wound center Notes: ` Pain, Acute or Chronic Nursing Diagnoses: Pain, acute or chronic: actual or potential Potential alteration in comfort, pain Goals: Patient/caregiver will verbalize adequate pain control between visits Date Initiated: 12/05/2016 Target Resolution Date: 03/28/2017 Goal Status: Active Kimberly [name], Kimberly [name] ([id number]) Interventions: Complete pain assessment as per visit requirements Notes: ` Wound/Skin Impairment Nursing Diagnoses: Impaired tissue integrity Knowledge deficit related to smoking impact on wound healing Knowledge deficit related to ulceration/compromised skin integrity Goals: Ulcer/skin breakdown will have a volume reduction of 80% by week 12 Date Initiated: 12/05/2016 Target Resolution Date: 03/28/2017 Goal Status: Active Interventions: Assess patient/caregiver ability to perform ulcer/skin care regimen upon admission and as needed Assess ulceration(s) every visit Provide education on smoking Notes: Electronic Signature(s) Signed: 01/23/2017 4:40:13 PM By: Roger Shelter Entered By: Roger Shelter on 01/23/2017 11:11:51 Kimberly [name] ([id number]) -------------------------------------------------------------------------------- Pain Assessment Details Patient Name: Kimberly [name] Date of Service: 01/23/2017 10:15 AM Medical Record Number: [id number] Patient Account Number: [id number] Date of Birth/Sex: August 16, 1945 (72 y.o. Female) Treating RN: Roger Shelter Primary Care Hollynn Garno: Cathlean Cower Other Clinician: Referring Cleon Signorelli: Cathlean Cower Treating Kingdavid Leinbach/Extender: Melburn Hake, HOYT Weeks in Treatment: 7  Active  Problems Location of Pain Severity and Description of Pain Patient Has Paino No Site Locations Pain Management and Medication Current Pain Management: Electronic Signature(s) Signed: 01/23/2017 4:40:13 PM By: Roger Shelter Entered By: Roger Shelter on 01/23/2017 10:48:15 Kimberly [name] ([id number]) -------------------------------------------------------------------------------- Patient/Caregiver Education Details Patient Name: Kimberly [name] Date of Service: 01/23/2017 10:15 AM Medical Record Number: [id number] Patient Account Number: [id number] Date of Birth/Gender: December 18, 1945 (72 y.o. Female) Treating RN: Roger Shelter Primary Care Physician: Cathlean Cower Other Clinician: Referring Physician: Cathlean Cower Treating Physician/Extender: Sharalyn Ink in Treatment: 7 Education Assessment Education Provided To: Patient Education Topics Provided Wound Debridement: Handouts: Wound Debridement Methods: Explain/Verbal Responses: State content correctly Wound/Skin Impairment: Handouts: Caring for Your Ulcer Methods: Explain/Verbal Responses: State content correctly Electronic Signature(s) Signed: 01/23/2017 4:40:13 PM By: Roger Shelter Entered By: Roger Shelter on 01/23/2017 15:04:01 Kimberly [name] ([id number]) -------------------------------------------------------------------------------- Wound Assessment Details Patient Name: Kimberly [name] Date of Service: 01/23/2017 10:15 AM Medical Record Number: [id number] Patient Account Number: [id number] Date of Birth/Sex: January 31, 1945 (72 y.o. Female) Treating RN: Roger Shelter Primary Care Blondie Riggsbee: Cathlean Cower Other Clinician: Referring Kadience Macchi: Cathlean Cower Treating Raelle Chambers/Extender: Melburn Hake, HOYT Weeks in Treatment: 7 Wound Status Wound Number: 1 Primary Trauma, Other Etiology: Wound Location: Right Lower Leg - Lateral Wound Open Wounding Event: Trauma Status: Date Acquired: 09/03/2016 Comorbid Anemia,  Asthma, Chronic Obstructive Weeks Of Treatment: 7 History: Pulmonary Disease (COPD), Congestive Heart Clustered Wound: No Failure Photos Photo Uploaded By: Roger Shelter on 01/23/2017 16:53:34 Wound Measurements Length: (cm) 0.3 Width: (cm) 0.2 Depth: (cm) 0.1 Area: (cm) 0.047 Volume: (cm) 0.005 % Reduction in Area: 95.7% % Reduction in Volume: 97.7% Epithelialization: Large (67-100%) Tunneling: No Undermining: No Wound Description Classification: Partial Thickness Wound Margin: Flat and Intact Exudate Amount: None Present Foul Odor After Cleansing: No Slough/Fibrino No Wound Bed Granulation Amount: Small (1-33%) Exposed Structure Granulation Quality: Red Fascia Exposed: No Necrotic Amount: Large (67-100%) Fat Layer (Subcutaneous Tissue) Exposed: No Necrotic Quality: Eschar Tendon Exposed: No Muscle Exposed: No Joint Exposed: No Bone Exposed: No Periwound Skin Texture Texture Color No Abnormalities Noted: No No Abnormalities Noted: No Kimberly [name], Kimberly B. ([id number]) Callus: No Atrophie Blanche: No Crepitus: No Cyanosis: No Excoriation: Yes Ecchymosis: No Induration: No Erythema: No Rash: No Hemosiderin Staining: No Scarring: No Mottled: No Pallor: No Moisture Rubor: No No Abnormalities Noted: No Dry / Scaly: Yes Temperature / Pain Maceration: No Temperature: No Abnormality Tenderness on Palpation: Yes Wound Preparation Ulcer Cleansing: Rinsed/Irrigated with Saline Topical Anesthetic Applied: Other: lidocaine 4%, Treatment Notes Wound #1 (Right, Lateral Lower Leg) 1. Cleansed with: Clean wound with Normal Saline Cleanse wound with antibacterial soap and water 3. Peri-wound Care: Moisturizing lotion Notes non adherent, kerlix and coban Electronic Signature(s) Signed: 01/23/2017 4:40:13 PM By: Roger Shelter Entered By: Roger Shelter on 01/23/2017 10:57:25 Kimberly [name]  ([id number]) -------------------------------------------------------------------------------- Wound Assessment Details Patient Name: Kimberly [name] Date of Service: 01/23/2017 10:15 AM Medical Record Number: [id number] Patient Account Number: [id number] Date of Birth/Sex: [birth year] (72 y.o. Female) Treating RN: Roger Shelter Primary Care Wlliam Grosso: Cathlean Cower Other Clinician: Referring Janie Strothman: Cathlean Cower Treating Jenina Moening/Extender: Melburn Hake, HOYT Weeks in Treatment: 7 Wound Status Wound Number: 2 Primary Trauma, Other Etiology: Wound Location: Right Lower Leg - Posterior Wound Open Wounding Event: Trauma Status: Date Acquired: 09/03/2016 Comorbid Anemia, Asthma, Chronic Obstructive Weeks Of Treatment: 7 History: Pulmonary Disease (COPD), Congestive Heart Clustered Wound: No Failure Photos Photo Uploaded By: Roger Shelter on 01/23/2017 16:53:34 Wound Measurements Length: (cm)  2.1 Width: (cm) 1.1 Depth: (cm) 0.1 Area: (cm) 1.814 Volume: (cm) 0.181 % Reduction in Area: -20.3% % Reduction in Volume: -19.9% Epithelialization: Small (1-33%) Tunneling: No Undermining: No Wound Description Classification: Partial Thickness Wound Margin: Distinct, outline attached Exudate Amount: Medium Exudate Type: Serous Exudate Color: amber Foul Odor After Cleansing: No Slough/Fibrino Yes Wound Bed Granulation Amount: Medium (34-66%) Exposed Structure Granulation Quality: Red, Pink Fat Layer (Subcutaneous Tissue) Exposed: Yes Necrotic Amount: Medium (34-66%) Necrotic Quality: Adherent Slough Periwound Skin Texture Texture Color No Abnormalities Noted: No No Abnormalities Noted: No Callus: No Atrophie Blanche: No Kimberly [name], Kimberly [name] ([id number]) Crepitus: No Cyanosis: No Excoriation: Yes Ecchymosis: No Induration: Yes Erythema: No Rash: No Hemosiderin Staining: No Scarring: No Mottled: No Pallor: No Moisture Rubor: No No Abnormalities Noted: No Dry / Scaly: No  Temperature / Pain Maceration: Yes Temperature: No Abnormality Tenderness on Palpation: Yes Wound Preparation Ulcer Cleansing: Rinsed/Irrigated with Saline Topical Anesthetic Applied: Other: lidocaine 4#, Treatment Notes Wound #2 (Right, Posterior Lower Leg) 1. Cleansed with: Clean wound with Normal Saline Cleanse wound with antibacterial soap and water 2. Anesthetic Topical Lidocaine 4% cream to wound bed prior to debridement Hurricaine Topical Anesthetic Spray 4. Dressing Applied: Santyl Ointment 5. Secondary Dressing Applied Non-Adherent pad Notes silvercell, kerlix and coban Electronic Signature(s) Signed: 01/23/2017 4:40:13 PM By: Roger Shelter Entered By: Roger Shelter on 01/23/2017 10:59:11 Kimberly [name] ([id number]) -------------------------------------------------------------------------------- Vitals Details Patient Name: Kimberly [name] Date of Service: 01/23/2017 10:15 AM Medical Record Number: [id number] Patient Account Number: [id number] Date of Birth/Sex: 1945-06-18 (72 y.o. Female) Treating RN: Roger Shelter Primary Care Kristee Angus: Cathlean Cower Other Clinician: Referring Noraa Pickeral: Cathlean Cower Treating Lesette Frary/Extender: Melburn Hake, HOYT Weeks in Treatment: 7 Vital Signs Time Taken: 10:48 Temperature (F): 97.9 Height (in): 61 Pulse (bpm): 60 Weight (lbs): 111.9 Respiratory Rate (breaths/min): 20 Body Mass Index (BMI): 21.1 Blood Pressure (mmHg): 109/56 Reference Range: 80 - 120 mg / dl Electronic Signature(s) Signed: 01/23/2017 4:40:13 PM By: Roger Shelter Entered By: Roger Shelter on 01/23/2017 10:55:41

## 2017-01-26 ENCOUNTER — Other Ambulatory Visit
Admission: RE | Admit: 2017-01-26 | Discharge: 2017-01-26 | Disposition: A | Discharge status: Home / Self Care | Payer: Medicare Other | Source: Ambulatory Visit | Attending: Cardiology | Admitting: Cardiology

## 2017-01-26 DIAGNOSIS — I739 Peripheral vascular disease, unspecified: Secondary | ICD-10-CM | POA: Insufficient documentation

## 2017-01-26 DIAGNOSIS — I340 Nonrheumatic mitral (valve) insufficiency: Secondary | ICD-10-CM | POA: Insufficient documentation

## 2017-01-26 DIAGNOSIS — F17210 Nicotine dependence, cigarettes, uncomplicated: Secondary | ICD-10-CM | POA: Diagnosis not present

## 2017-01-26 DIAGNOSIS — E785 Hyperlipidemia, unspecified: Secondary | ICD-10-CM | POA: Diagnosis not present

## 2017-01-26 DIAGNOSIS — J449 Chronic obstructive pulmonary disease, unspecified: Secondary | ICD-10-CM | POA: Diagnosis not present

## 2017-01-26 DIAGNOSIS — I5022 Chronic systolic (congestive) heart failure: Secondary | ICD-10-CM | POA: Insufficient documentation

## 2017-01-26 DIAGNOSIS — I2510 Atherosclerotic heart disease of native coronary artery without angina pectoris: Secondary | ICD-10-CM | POA: Insufficient documentation

## 2017-01-26 DIAGNOSIS — I429 Cardiomyopathy, unspecified: Secondary | ICD-10-CM | POA: Insufficient documentation

## 2017-01-26 LAB — BASIC METABOLIC PANEL
Anion gap: 10 (ref 5–15)
BUN: 18 mg/dL (ref 6–20)
CHLORIDE: 98 mmol/L — AB (ref 101–111)
CO2: 30 mmol/L (ref 22–32)
CREATININE: 1.07 mg/dL — AB (ref 0.44–1.00)
Calcium: 9.5 mg/dL (ref 8.9–10.3)
GFR calc non Af Amer: 52 mL/min — ABNORMAL LOW (ref >60)
GFR, EST AFRICAN AMERICAN: 60 mL/min — AB (ref >60)
Glucose, Bld: 114 mg/dL — ABNORMAL HIGH (ref 65–99)
Potassium: 4.3 mmol/L (ref 3.5–5.1)
Sodium: 138 mmol/L (ref 135–145)

## 2017-01-29 ENCOUNTER — Other Ambulatory Visit (HOSPITAL_COMMUNITY): Payer: Self-pay | Admitting: *Deleted

## 2017-01-29 ENCOUNTER — Ambulatory Visit (HOSPITAL_COMMUNITY): Payer: Medicare Other | Attending: Internal Medicine

## 2017-01-29 DIAGNOSIS — I5022 Chronic systolic (congestive) heart failure: Secondary | ICD-10-CM

## 2017-02-02 ENCOUNTER — Encounter (HOSPITAL_COMMUNITY): Payer: Self-pay | Admitting: Cardiology

## 2017-02-02 ENCOUNTER — Ambulatory Visit (HOSPITAL_COMMUNITY)
Admission: RE | Admit: 2017-02-02 | Discharge: 2017-02-02 | Disposition: A | Discharge status: Home / Self Care | Payer: Medicare Other | Source: Ambulatory Visit | Attending: Cardiology | Admitting: Cardiology

## 2017-02-02 VITALS — BP 108/68 | HR 63 | Wt 108.0 lb

## 2017-02-02 DIAGNOSIS — I471 Supraventricular tachycardia: Secondary | ICD-10-CM | POA: Diagnosis not present

## 2017-02-02 DIAGNOSIS — Z7902 Long term (current) use of antithrombotics/antiplatelets: Secondary | ICD-10-CM | POA: Diagnosis not present

## 2017-02-02 DIAGNOSIS — Z79899 Other long term (current) drug therapy: Secondary | ICD-10-CM | POA: Diagnosis not present

## 2017-02-02 DIAGNOSIS — J449 Chronic obstructive pulmonary disease, unspecified: Secondary | ICD-10-CM | POA: Insufficient documentation

## 2017-02-02 DIAGNOSIS — I5022 Chronic systolic (congestive) heart failure: Secondary | ICD-10-CM | POA: Insufficient documentation

## 2017-02-02 DIAGNOSIS — I429 Cardiomyopathy, unspecified: Secondary | ICD-10-CM | POA: Insufficient documentation

## 2017-02-02 DIAGNOSIS — Z720 Tobacco use: Secondary | ICD-10-CM

## 2017-02-02 DIAGNOSIS — Z8249 Family history of ischemic heart disease and other diseases of the circulatory system: Secondary | ICD-10-CM | POA: Insufficient documentation

## 2017-02-02 DIAGNOSIS — F17200 Nicotine dependence, unspecified, uncomplicated: Secondary | ICD-10-CM | POA: Insufficient documentation

## 2017-02-02 DIAGNOSIS — I480 Paroxysmal atrial fibrillation: Secondary | ICD-10-CM | POA: Insufficient documentation

## 2017-02-02 DIAGNOSIS — Z9889 Other specified postprocedural states: Secondary | ICD-10-CM | POA: Diagnosis not present

## 2017-02-02 DIAGNOSIS — Z8774 Personal history of (corrected) congenital malformations of heart and circulatory system: Secondary | ICD-10-CM | POA: Insufficient documentation

## 2017-02-02 DIAGNOSIS — E785 Hyperlipidemia, unspecified: Secondary | ICD-10-CM | POA: Diagnosis not present

## 2017-02-02 DIAGNOSIS — I340 Nonrheumatic mitral (valve) insufficiency: Secondary | ICD-10-CM | POA: Diagnosis not present

## 2017-02-02 DIAGNOSIS — I739 Peripheral vascular disease, unspecified: Secondary | ICD-10-CM | POA: Diagnosis not present

## 2017-02-02 DIAGNOSIS — Z7982 Long term (current) use of aspirin: Secondary | ICD-10-CM | POA: Insufficient documentation

## 2017-02-02 LAB — DIGOXIN LEVEL: DIGOXIN LVL: 1 ng/mL (ref 0.8–2.0)

## 2017-02-02 MED ORDER — SACUBITRIL-VALSARTAN 24-26 MG PO TABS: 1.0000 | ORAL_TABLET | Freq: Two times a day (BID) | ORAL | 3 refills | Status: DC

## 2017-02-02 MED ORDER — FUROSEMIDE 20 MG PO TABS: 20.0000 mg | ORAL_TABLET | Freq: Every day | ORAL | 3 refills | Status: DC

## 2017-02-02 NOTE — Patient Instructions (Signed)
Stop Losartan  Start Entresto 24/26 mg (1 tab), twice a day (start 36 hours after last dose of Losartan)  Decrease Furosemide 20 mg (1 tab) daily  You have been referred to Dr. Lovena Le for ICD   Labs drawn today (if we do not call you, then your lab work was stable)   Your physician recommends that you return for lab work in: 10 days   Your physician recommends that you schedule a follow-up appointment in: 6 weeks with Dr. Aundra Dubin

## 2017-02-02 NOTE — Progress Notes (Signed)
PCP: Dr. Jenny Reichmann Cardiology: Dr. Harrington Challenger HF Cardiology: Dr. Aundra Dubin  72 yo with history of VSD repair as a child, COPD/active smoking, PAD, and chronic systolic CHF was referred by Dr. Harrington Challenger for evaluation of CHF.   Patient has a long history of cardiomyopathy.  She had VSD repaired as a child and imaging has not showed residual VSD.  Cardiac MRI in 11/09 showed EF 36%.  Echo in 12/15 showed EF 40-45%.  Echo in 3/18 showed EF down to 15-20% with moderate RV systolic dysfunction.  RHC/LHC in 10/18 showed nonobstructive coronary but was concerning for low output (CI 1.33).  She had a right lower leg slowly healing ulcer => this is now mostly better.  She had peripheral arteriogram with bilateral occluded CFAs. She had PCI to right CFA.    CPX in 1/19 showed moderate to severe functional limitation, combination of HF and lung disease, probably more related to the lung disease.  PFTs in 12/18 showed severe COPD.   She returns for followup of CHF.  Currently, no claudication in left leg when walking.  She is now on Wellbutrin and complains of dry mouth, but she has cut down to 4 cigs/day.  Breathing is generally stable.  She is ok walking on flat ground.  Short of breath with a flight of stairs.  No orthopnea/PND.  Occasional chest tightness seems to be related to wheezing.  No lightheadedness/dizziness.   Labs (7/18): LDL 61 Labs (11/18): K 4.2, creatinine 0.7, hgb 13.7 Labs (1/19): K 4.3, creatinine 1.07  PMH: 1. VSD: s/p repair at [facility] as child.  From description, sounds like muscular VSD.  2. Atrial tachycardia s/p ablation.  3. Hyperlipidemia 4. COPD: Active smoker.  - PFTs (12/18): Severe obstructive lung disease.  5. Atrial fibrillation: Paroxysmal.   Noted only transiently.   6. PAD: ABIs 11/18 with 0.62 on right, 0.72 on left.  - Angiogram 11/18 showed totally occluded bilateral CFAs.  Patient had stent to right CFA.  ABIs in 12/18 were normal on right.  7. Chronic systolic CHF: Nonischemic  cardiomyopathy.   - cMRI (11/09): EF 36%, VSD patch in anterior ventricular septum - Echo (12/15): EF 40-45% - Echo (3/18): EF 15-20%, moderate LV dilation, moderate MR, moderate RV dilation with moderately decreased systolic function.  - LHC/RHC (10/18): 3+ MR, EF < 25%, minimal nonobstructive CAD; PA 41/21, LVEDP 18, CI 1.33.  - CPX (1/19): peak VO2 13.4, VE/VCO2 slope 43, RER 1.07.  Mod-severe functional limitation due to combination of HF and lung disease, probably more related to lung disease.   SH: Active smoker, lives in [place], married.  FH: Mother with PAD, sister died at birth from congenital heart disease.   ROS: All systems reviewed and negative except as per HPI.   Current Outpatient Medications  Medication Sig Dispense Refill  . albuterol (VENTOLIN HFA) 108 (90 Base) MCG/ACT inhaler Inhale 2 puffs into the lungs every 6 (six) hours as needed for wheezing or shortness of breath. 18 g 11  . ALPRAZolam (XANAX) 0.25 MG tablet TAKE 1 TABLET BY MOUTH TWICE A DAY AS NEEDED FOR ANXIETY. 60 tablet 2  . aspirin 81 MG tablet Take 1 tablet (81 mg total) by mouth daily. 100 tablet 99  . buPROPion (WELLBUTRIN SR) 150 MG 12 hr tablet Take 1 tablet (150 mg total) by mouth daily for 3 days, THEN 1 tablet (150 mg total) 2 (two) times daily. 60 tablet 3  . carvedilol (COREG) 3.125 MG tablet Take 1 tablet (3.125  mg total) by mouth 2 (two) times daily. 60 tablet 3  . celecoxib (CELEBREX) 100 MG capsule Take 100 mg by mouth 2 (two) times daily as needed for moderate pain.    Marland Kitchen clopidogrel (PLAVIX) 75 MG tablet Take 1 tablet (75 mg total) by mouth daily. 30 tablet 6  . collagenase (SANTYL) ointment Apply 1 application topically daily.    . digoxin (LANOXIN) 0.125 MG tablet Take 1 tablet (0.125 mg total) by mouth daily. 90 tablet 3  . furosemide (LASIX) 20 MG tablet Take 1 tablet (20 mg total) by mouth daily. 30 tablet 3  . Hydrocod Polst-Chlorphen Polst 10-8 MG CP12 Take 5 mLs every 12 (twelve)  hours as needed by mouth (for cough). 90 each 0  . ibuprofen (ADVIL,MOTRIN) 200 MG tablet Take 200 mg by mouth daily as needed for headache.    . potassium chloride (K-DUR) 10 MEQ tablet Take 1 tablet (10 mEq total) daily by mouth. (Patient taking differently: Take 10 mEq by mouth 2 (two) times daily. ) 90 tablet 3  . rosuvastatin (CRESTOR) 10 MG tablet TAKE 1 TABLET BY MOUTH EVERY DAY. 90 tablet 3  . SILVER HYDROGEL GEL Apply 1 inch topically 2 (two) times daily. 1 Tube 0  . spironolactone (ALDACTONE) 25 MG tablet Take 0.5 tablets (12.5 mg total) by mouth daily. 45 tablet 3  . sacubitril-valsartan (ENTRESTO) 24-26 MG Take 1 tablet by mouth 2 (two) times daily. 60 tablet 3   No current facility-administered medications for this encounter.    BP 108/68   Pulse 63   Wt 108 lb (49 kg)   SpO2 94%   BMI 20.41 kg/m  General: NAD Neck: No JVD, no thyromegaly or thyroid nodule.  Lungs: Distant BS bilaterally.  CV: Lateral PMI.  Heart regular S1/S2, no S3/S4, 2/6 HSM apex.  No peripheral edema.  No carotid bruit.  Normal pedal pulses.  Abdomen: Soft, nontender, no hepatosplenomegaly, no distention.  Skin: Intact without lesions or rashes.  Neurologic: Alert and oriented x 3.  Psych: Normal affect. Extremities: No clubbing or cyanosis.  HEENT: Normal.   Assessment/Plan: 1.  PAD: She had recent PCI to occluded right CFA, right leg ulcer now healing.  She has occluded left CFA also that was not intervened on. She reports minimal claudication in that leg.  - Needs to stop smoking.  - Continue statin.  2. Smoking: She is taking Wellbutrin and cutting back.  3. CAD: Nonobstructive on 10/18 cath.  4. Hyperlipidemia: Goal LDL < 70 , at goal on 7/18 lipids profile, continue Crestor.  5. COPD: Severe by 12/18 PFTs.  Working on cutting back smoking.  COPD plays a significant role in her dyspnea.  6. Chronic systolic CHF: Nonischemic cardiomyopathy.  RHC/LHC in 10/18 showed no nonobstructive coronary  disease but CI was very low at 1.33.  Her low cardiac output is out of proportion to her symptoms.  CPX suggested moderate-severe functional limitation, but probably more due to COPD than CHF.  On exam, she is not volume overloaded. NYHA class III symptoms.  - Stop losartan, start Entresto 24/26 bid. BMET 10 days.  - Decrease Lasix to 20 mg daily.  - Continue Coreg 3.125 mg bid.  - Start spironolactone 12.5 mg daily - Continue digoxin, check level today.  - Now that her ulcer has healed, I will refer her to EP (Dr Lovena Le) to discuss ICD given persistently low EF.  She has a nonischemic cardiomyopathy, so probably less benefit than if ischemic, but  with discussion today she was interested in getting a defibrillator.  Would prefer [employer] for [employer].  She will not be a CRT candidate with RBBB.  - LVAD would be a difficult proposition for her given severity of lung disease.   7. Mitral regurgitation: Moderate on last echo, likely functional. Murmur on exam noted.   Followup in 6 wks.   Loralie Champagne 02/02/2017

## 2017-02-06 ENCOUNTER — Encounter: Payer: Medicare Other | Admitting: Physician Assistant

## 2017-02-06 DIAGNOSIS — I739 Peripheral vascular disease, unspecified: Secondary | ICD-10-CM | POA: Diagnosis not present

## 2017-02-06 DIAGNOSIS — I110 Hypertensive heart disease with heart failure: Secondary | ICD-10-CM | POA: Diagnosis not present

## 2017-02-06 DIAGNOSIS — E785 Hyperlipidemia, unspecified: Secondary | ICD-10-CM | POA: Diagnosis not present

## 2017-02-06 DIAGNOSIS — J449 Chronic obstructive pulmonary disease, unspecified: Secondary | ICD-10-CM | POA: Diagnosis not present

## 2017-02-06 DIAGNOSIS — L97212 Non-pressure chronic ulcer of right calf with fat layer exposed: Secondary | ICD-10-CM | POA: Diagnosis not present

## 2017-02-06 DIAGNOSIS — L97812 Non-pressure chronic ulcer of other part of right lower leg with fat layer exposed: Secondary | ICD-10-CM | POA: Diagnosis not present

## 2017-02-06 DIAGNOSIS — I890 Lymphedema, not elsewhere classified: Secondary | ICD-10-CM | POA: Diagnosis not present

## 2017-02-06 DIAGNOSIS — I509 Heart failure, unspecified: Secondary | ICD-10-CM | POA: Diagnosis not present

## 2017-02-09 NOTE — Progress Notes (Signed)
TEIANA, HAJDUK ([id number]) Visit Report for 02/06/2017 Arrival Information Details Patient Name: Kimberly, [name] Date of Service: 02/06/2017 10:15 AM Medical Record Number: [id number] Patient Account Number: [id number] Date of Birth/Sex: October 24, 1945 (71 y.o. Female) Treating RN: Roger Shelter Primary Care Sadiel Mota: Cathlean Cower Other Clinician: Referring Keland Peyton: Cathlean Cower Treating Azura Tufaro/Extender: Melburn Hake, HOYT Weeks in Treatment: 9 Visit Information History Since Last Visit All ordered tests and consults were completed: No Patient Arrived: Ambulatory Added or deleted any medications: No Arrival Time: 10:15 Any new allergies or adverse reactions: No Accompanied By: husband Had a fall or experienced change in No Transfer Assistance: None activities of daily living that may affect Patient Identification Verified: Yes risk of falls: Secondary Verification Process Yes Signs or symptoms of abuse/neglect since last visito No Completed: Hospitalized since last visit: No Patient Requires Transmission-Based No Pain Present Now: No Precautions: Patient Has Alerts: Yes Patient Alerts: R ABI 0.62 from EPIC L ABI 0.72 from [facility] Electronic Signature(s) Signed: 02/06/2017 5:14:03 PM By: Roger Shelter Entered By: Roger Shelter on 02/06/2017 10:17:33 Kimberly [name] ([id number]) -------------------------------------------------------------------------------- Clinic Level of Care Assessment Details Patient Name: Kimberly [name] Date of Service: 02/06/2017 10:15 AM Medical Record Number: [id number] Patient Account Number: [id number] Date of Birth/Sex: 1945-09-29 (71 y.o. Female) Treating RN: Roger Shelter Primary Care Mariel Lukins: Cathlean Cower Other Clinician: Referring Laranda Burkemper: Cathlean Cower Treating Marlyss Cissell/Extender: Melburn Hake, HOYT Weeks in Treatment: 9 Clinic Level of Care Assessment Items TOOL 4 Quantity Score []  - Use when only an EandM is performed on FOLLOW-UP visit  0 ASSESSMENTS - Nursing Assessment / Reassessment []  - Reassessment of Co-morbidities (includes updates in patient status) 0 X- 1 5 Reassessment of Adherence to Treatment Plan ASSESSMENTS - Wound and Skin Assessment / Reassessment []  - Simple Wound Assessment / Reassessment - one wound 0 X- 2 5 Complex Wound Assessment / Reassessment - multiple wounds []  - 0 Dermatologic / Skin Assessment (not related to wound area) ASSESSMENTS - Focused Assessment []  - Circumferential Edema Measurements - multi extremities 0 []  - 0 Nutritional Assessment / Counseling / Intervention []  - 0 Lower Extremity Assessment (monofilament, tuning fork, pulses) []  - 0 Peripheral Arterial Disease Assessment (using hand held doppler) ASSESSMENTS - Ostomy and/or Continence Assessment and Care []  - Incontinence Assessment and Management 0 []  - 0 Ostomy Care Assessment and Management (repouching, etc.) PROCESS - Coordination of Care []  - Simple Patient / Family Education for ongoing care 0 X- 1 20 Complex (extensive) Patient / Family Education for ongoing care []  - 0 Staff obtains Programmer, systems, Records, Test Results / Process Orders []  - 0 Staff telephones HHA, Nursing Homes / Clarify orders / etc []  - 0 Routine Transfer to another Facility (non-emergent condition) []  - 0 Routine Hospital Admission (non-emergent condition) []  - 0 New Admissions / Biomedical engineer / Ordering NPWT, Apligraf, etc. []  - 0 Emergency Hospital Admission (emergent condition) []  - 0 Simple Discharge Coordination Kimberly, SCHNECK B. ([id number]) X- 1 15 Complex (extensive) Discharge Coordination PROCESS - Special Needs []  - Pediatric / Minor Patient Management 0 []  - 0 Isolation Patient Management []  - 0 Hearing / Language / Visual special needs []  - 0 Assessment of Community assistance (transportation, D/C planning, etc.) []  - 0 Additional assistance / Altered mentation []  - 0 Support Surface(s) Assessment (bed,  cushion, seat, etc.) INTERVENTIONS - Wound Cleansing / Measurement []  - Simple Wound Cleansing - one wound 0 X- 2 5 Complex Wound Cleansing - multiple wounds X- 1 5 Wound Imaging (photographs -  any number of wounds) []  - 0 Wound Tracing (instead of photographs) []  - 0 Simple Wound Measurement - one wound X- 2 5 Complex Wound Measurement - multiple wounds INTERVENTIONS - Wound Dressings X - Small Wound Dressing one or multiple wounds 2 10 []  - 0 Medium Wound Dressing one or multiple wounds []  - 0 Large Wound Dressing one or multiple wounds X- 1 5 Application of Medications - topical []  - 0 Application of Medications - injection INTERVENTIONS - Miscellaneous []  - External ear exam 0 []  - 0 Specimen Collection (cultures, biopsies, blood, body fluids, etc.) []  - 0 Specimen(s) / Culture(s) sent or taken to Lab for analysis []  - 0 Patient Transfer (multiple staff / Civil Service fast streamer / Similar devices) []  - 0 Simple Staple / Suture removal (25 or less) []  - 0 Complex Staple / Suture removal (26 or more) []  - 0 Hypo / Hyperglycemic Management (close monitor of Blood Glucose) []  - 0 Ankle / Brachial Index (ABI) - do not check if billed separately X- 1 5 Vital Signs Kimberly, STERN B. ([id number]) Has the patient been seen at the hospital within the last three years: Yes Total Score: 105 Level Of Care: New/Established - Level 3 Electronic Signature(s) Signed: 02/06/2017 5:14:03 PM By: Roger Shelter Entered By: Roger Shelter on 02/06/2017 10:58:04 Kimberly [name] ([id number]) -------------------------------------------------------------------------------- Encounter Discharge Information Details Patient Name: Kimberly [name] Date of Service: 02/06/2017 10:15 AM Medical Record Number: [id number] Patient Account Number: [id number] Date of Birth/Sex: October 24, 1945 (71 y.o. Female) Treating RN: Roger Shelter Primary Care Kingson Lohmeyer: Cathlean Cower Other Clinician: Referring Katee Wentland: Cathlean Cower Treating Draven Natter/Extender: Melburn Hake, HOYT Weeks in Treatment: 9 Encounter Discharge Information Items Discharge Pain Level: 0 Discharge Condition: Stable Ambulatory Status: Ambulatory Discharge Destination: Home Transportation: Private Auto Accompanied By: husbAND Schedule Follow-up Appointment: Yes Medication Reconciliation completed and No provided to Patient/Care Rocco Kerkhoff: Patient Clinical Summary of Care: Declined Electronic Signature(s) Signed: 02/06/2017 5:14:03 PM By: Roger Shelter Entered By: Roger Shelter on 02/06/2017 11:00:02 Kimberly [name] ([id number]) -------------------------------------------------------------------------------- Lower Extremity Assessment Details Patient Name: Kimberly [name] Date of Service: 02/06/2017 10:15 AM Medical Record Number: [id number] Patient Account Number: [id number] Date of Birth/Sex: 03/21/1945 (71 y.o. Female) Treating RN: Roger Shelter Primary Care Diane Mochizuki: Cathlean Cower Other Clinician: Referring Kensey Luepke: Cathlean Cower Treating Pistol Kessenich/Extender: Melburn Hake, HOYT Weeks in Treatment: 9 Vascular Assessment Claudication: Claudication Assessment [Right:None] Pulses: Dorsalis Pedis Palpable: [Right:Yes] Posterior Tibial Extremity colors, hair growth, and conditions: Extremity Color: [Right:Hyperpigmented] Hair Growth on Extremity: [Right:Yes] Capillary Refill: [Right:< 3 seconds] Toe Nail Assessment Left: Right: Thick: Yes Discolored: Yes Deformed: Yes Improper Length and Hygiene: Yes Electronic Signature(s) Signed: 02/06/2017 5:14:03 PM By: Roger Shelter Entered By: Roger Shelter on 02/06/2017 10:28:26 Kimberly [name] ([id number]) -------------------------------------------------------------------------------- Multi Wound Chart Details Patient Name: Kimberly [name] Date of Service: 02/06/2017 10:15 AM Medical Record Number: [id number] Patient Account Number: [id number] Date of Birth/Sex: 1945/10/20 (71  y.o. Female) Treating RN: Roger Shelter Primary Care Niki Cosman: Cathlean Cower Other Clinician: Referring Adetokunbo Mccadden: Cathlean Cower Treating Shraga Custard/Extender: Melburn Hake, HOYT Weeks in Treatment: 9 Photos: [1:No Photos] [2:No Photos] [N/A:N/A] Wound Location: [1:Right Lower Leg - Lateral] [2:Right Lower Leg - Posterior] [N/A:N/A] Wounding Event: [1:Trauma] [2:Trauma] [N/A:N/A] Primary Etiology: [1:Trauma, Other] [2:Trauma, Other] [N/A:N/A] Comorbid History: [1:Anemia, Asthma, Chronic Obstructive Pulmonary Disease (COPD), Congestive Heart Failure] [2:Anemia, Asthma, Chronic Obstructive Pulmonary Disease (COPD), Congestive Heart Failure] [N/A:N/A] Date Acquired: [1:09/03/2016] [2:09/03/2016] [N/A:N/A] Weeks of Treatment: [1:9] [2:9] [N/A:N/A] Wound Status: [1:Open] [2:Open] [N/A:N/A] Measurements L x W  x D [1:0.1x0.1x0.1] [2:0.6x0.5x0.1] [N/A:N/A] (cm) Area (cm) : [1:0.008] [2:0.236] [N/A:N/A] Volume (cm) : [1:0.001] [2:0.024] [N/A:N/A] % Reduction in Area: [1:99.30%] [2:84.40%] [N/A:N/A] % Reduction in Volume: [1:99.50%] [2:84.10%] [N/A:N/A] Classification: [1:Partial Thickness] [2:Partial Thickness] [N/A:N/A] Exudate Amount: [1:None Present] [2:Small] [N/A:N/A] Exudate Type: [1:N/A] [2:Serous] [N/A:N/A] Exudate Color: [1:N/A] [2:amber] [N/A:N/A] Wound Margin: [1:Flat and Intact] [2:Distinct, outline attached] [N/A:N/A] Granulation Amount: [1:Large (67-100%)] [2:Large (67-100%)] [N/A:N/A] Granulation Quality: [1:Red] [2:Red, Pink] [N/A:N/A] Necrotic Amount: [1:None Present (0%)] [2:Small (1-33%)] [N/A:N/A] Exposed Structures: [1:Fascia: No Fat Layer (Subcutaneous Tissue) Exposed: No Tendon: No Muscle: No Joint: No Bone: No] [2:Fat Layer (Subcutaneous Tissue) Exposed: Yes] [N/A:N/A] Epithelialization: [1:Large (67-100%)] [2:Small (1-33%)] [N/A:N/A] Periwound Skin Texture: [1:Excoriation: Yes Induration: No Callus: No Crepitus: No Rash: No Scarring: No] [2:Excoriation: Yes Induration: No  Callus: No Crepitus: No Rash: No Scarring: No] [N/A:N/A] Periwound Skin Moisture: [1:Maceration: No Dry/Scaly: No] [2:Maceration: Yes Dry/Scaly: No] [N/A:N/A] Periwound Skin Color: [1:Atrophie Blanche: No Cyanosis: No Ecchymosis: No Erythema: No Hemosiderin Staining: No] [2:Atrophie Blanche: No Cyanosis: No Ecchymosis: No Erythema: No Hemosiderin Staining: No] [N/A:N/A] Mottled: No Mottled: No Pallor: No Pallor: No Rubor: No Rubor: No Temperature: No Abnormality No Abnormality N/A Tenderness on Palpation: Yes Yes N/A Wound Preparation: Ulcer Cleansing: Ulcer Cleansing: N/A Rinsed/Irrigated with Saline Rinsed/Irrigated with Saline Topical Anesthetic Applied: Topical Anesthetic Applied: Other: lidocaine 4% Other: lidocaine 4# Treatment Notes Electronic Signature(s) Signed: 02/06/2017 5:14:03 PM By: Roger Shelter Entered By: Roger Shelter on 02/06/2017 10:29:06 Kimberly [name] ([id number]) -------------------------------------------------------------------------------- Multi-Disciplinary Care Plan Details Patient Name: Kimberly [name] Date of Service: 02/06/2017 10:15 AM Medical Record Number: [id number] Patient Account Number: [id number] Date of Birth/Sex: [birth year] (71 y.o. Female) Treating RN: Roger Shelter Primary Care Audrina Marten: Cathlean Cower Other Clinician: Referring Braya Habermehl: Cathlean Cower Treating Pio Eatherly/Extender: Melburn Hake, HOYT Weeks in Treatment: 9 Active Inactive ` Nutrition Nursing Diagnoses: Imbalanced nutrition Potential for alteratiion in Nutrition/Potential for imbalanced nutrition Goals: Patient/caregiver agrees to and verbalizes understanding of need to use nutritional supplements and/or vitamins as prescribed Date Initiated: 12/05/2016 Target Resolution Date: 02/28/2017 Goal Status: Active Interventions: Assess patient nutrition upon admission and as needed per policy Notes: ` Orientation to the Wound Care Program Nursing Diagnoses: Knowledge  deficit related to the wound healing center program Goals: Patient/caregiver will verbalize understanding of the [place] Program Date Initiated: 12/05/2016 Target Resolution Date: 12/27/2016 Goal Status: Active Interventions: Provide education on orientation to the wound center Notes: ` Pain, Acute or Chronic Nursing Diagnoses: Pain, acute or chronic: actual or potential Potential alteration in comfort, pain Goals: Patient/caregiver will verbalize adequate pain control between visits Date Initiated: 12/05/2016 Target Resolution Date: 03/28/2017 Goal Status: Active CADANCE, RAUS ([id number]) Interventions: Complete pain assessment as per visit requirements Notes: ` Wound/Skin Impairment Nursing Diagnoses: Impaired tissue integrity Knowledge deficit related to smoking impact on wound healing Knowledge deficit related to ulceration/compromised skin integrity Goals: Ulcer/skin breakdown will have a volume reduction of 80% by week 12 Date Initiated: 12/05/2016 Target Resolution Date: 03/28/2017 Goal Status: Active Interventions: Assess patient/caregiver ability to perform ulcer/skin care regimen upon admission and as needed Assess ulceration(s) every visit Provide education on smoking Notes: Electronic Signature(s) Signed: 02/06/2017 5:14:03 PM By: Roger Shelter Entered By: Roger Shelter on 02/06/2017 10:28:52 Kimberly [name] ([id number]) -------------------------------------------------------------------------------- Pain Assessment Details Patient Name: Kimberly [name] Date of Service: 02/06/2017 10:15 AM Medical Record Number: [id number] Patient Account Number: [id number] Date of Birth/Sex: 1945/12/01 (71 y.o. Female) Treating RN: Roger Shelter Primary Care Amabel Stmarie: Cathlean Cower Other Clinician: Referring Murl Zogg: Jenny Reichmann,  JAMES Treating My Rinke/Extender: STONE III, HOYT Weeks in Treatment: 9 Active Problems Location of Pain Severity and Description of  Pain Patient Has Paino No Site Locations Pain Management and Medication Current Pain Management: Electronic Signature(s) Signed: 02/06/2017 5:14:03 PM By: Roger Shelter Entered By: Roger Shelter on 02/06/2017 10:17:40 Kimberly [name] ([id number]) -------------------------------------------------------------------------------- Patient/Caregiver Education Details Patient Name: Kimberly [name] Date of Service: 02/06/2017 10:15 AM Medical Record Number: [id number] Patient Account Number: [id number] Date of Birth/Gender: 04-08-1945 (71 y.o. Female) Treating RN: Roger Shelter Primary Care Physician: Cathlean Cower Other Clinician: Referring Physician: Cathlean Cower Treating Physician/Extender: Sharalyn Ink in Treatment: 9 Education Assessment Education Provided To: Patient and Caregiver Education Topics Provided Wound/Skin Impairment: Handouts: Caring for Your Ulcer Methods: Explain/Verbal Responses: State content correctly Electronic Signature(s) Signed: 02/06/2017 5:14:03 PM By: Roger Shelter Entered By: Roger Shelter on 02/06/2017 11:00:16 Kimberly [name] ([id number]) -------------------------------------------------------------------------------- Wound Assessment Details Patient Name: Kimberly [name] Date of Service: 02/06/2017 10:15 AM Medical Record Number: [id number] Patient Account Number: [id number] Date of Birth/Sex: [birth year] (71 y.o. Female) Treating RN: Roger Shelter Primary Care Jovaun Levene: Cathlean Cower Other Clinician: Referring Osceola Holian: Cathlean Cower Treating Vitaliy Eisenhour/Extender: Melburn Hake, HOYT Weeks in Treatment: 9 Wound Status Wound Number: 1 Primary Trauma, Other Etiology: Wound Location: Right Lower Leg - Lateral Wound Open Wounding Event: Trauma Status: Date Acquired: 09/03/2016 Comorbid Anemia, Asthma, Chronic Obstructive Weeks Of Treatment: 9 History: Pulmonary Disease (COPD), Congestive Heart Clustered Wound: No Failure Photos Photo  Uploaded By: Roger Shelter on 02/06/2017 11:07:53 Wound Measurements Length: (cm) 0.1 Width: (cm) 0.1 Depth: (cm) 0.1 Area: (cm) 0.008 Volume: (cm) 0.001 % Reduction in Area: 99.3% % Reduction in Volume: 99.5% Epithelialization: Large (67-100%) Tunneling: No Undermining: No Wound Description Classification: Partial Thickness Wound Margin: Flat and Intact Exudate Amount: None Present Foul Odor After Cleansing: No Slough/Fibrino No Wound Bed Granulation Amount: Large (67-100%) Exposed Structure Granulation Quality: Red Fascia Exposed: No Necrotic Amount: None Present (0%) Fat Layer (Subcutaneous Tissue) Exposed: No Tendon Exposed: No Muscle Exposed: No Joint Exposed: No Bone Exposed: No Periwound Skin Texture Texture Color No Abnormalities Noted: No No Abnormalities Noted: No Kimberly, KIZZIAH B. ([id number]) Callus: No Atrophie Blanche: No Crepitus: No Cyanosis: No Excoriation: Yes Ecchymosis: No Induration: No Erythema: No Rash: No Hemosiderin Staining: No Scarring: No Mottled: No Pallor: No Moisture Rubor: No No Abnormalities Noted: No Dry / Scaly: No Temperature / Pain Maceration: No Temperature: No Abnormality Tenderness on Palpation: Yes Wound Preparation Ulcer Cleansing: Rinsed/Irrigated with Saline Topical Anesthetic Applied: Other: lidocaine 4%, Treatment Notes Wound #1 (Right, Lateral Lower Leg) 1. Cleansed with: Clean wound with Normal Saline 2. Anesthetic Topical Lidocaine 4% cream to wound bed prior to debridement 3. Peri-wound Care: Other peri-wound care (specify in notes) 4. Dressing Applied: Other dressing (specify in notes) 5. Secondary Dressing Applied Non-Adherent pad Notes TCA cream to red areas, silvercell to red wounds, and wrap with kerlix Electronic Signature(s) Signed: 02/06/2017 5:14:03 PM By: Roger Shelter Entered By: Roger Shelter on 02/06/2017 10:24:28 Kimberly [name]  ([id number]) -------------------------------------------------------------------------------- Wound Assessment Details Patient Name: Kimberly [name] Date of Service: 02/06/2017 10:15 AM Medical Record Number: [id number] Patient Account Number: [id number] Date of Birth/Sex: 05-Mar-1945 (71 y.o. Female) Treating RN: Roger Shelter Primary Care Cuma Polyakov: Cathlean Cower Other Clinician: Referring Maevyn Riordan: Cathlean Cower Treating Rhondalyn Clingan/Extender: Melburn Hake, HOYT Weeks in Treatment: 9 Wound Status Wound Number: 2 Primary Trauma, Other Etiology: Wound Location: Right Lower Leg - Posterior Wound Open Wounding Event: Trauma Status: Date Acquired: 09/03/2016 Comorbid Anemia,  Asthma, Chronic Obstructive Weeks Of Treatment: 9 History: Pulmonary Disease (COPD), Congestive Heart Clustered Wound: No Failure Photos Photo Uploaded By: Roger Shelter on 02/06/2017 11:07:54 Wound Measurements Length: (cm) 0.6 Width: (cm) 0.5 Depth: (cm) 0.1 Area: (cm) 0.236 Volume: (cm) 0.024 % Reduction in Area: 84.4% % Reduction in Volume: 84.1% Epithelialization: Small (1-33%) Tunneling: No Undermining: No Wound Description Classification: Partial Thickness Wound Margin: Distinct, outline attached Exudate Amount: Small Exudate Type: Serous Exudate Color: amber Foul Odor After Cleansing: No Slough/Fibrino Yes Wound Bed Granulation Amount: Large (67-100%) Exposed Structure Granulation Quality: Red, Pink Fat Layer (Subcutaneous Tissue) Exposed: Yes Necrotic Amount: Small (1-33%) Necrotic Quality: Adherent Slough Periwound Skin Texture Texture Color No Abnormalities Noted: No No Abnormalities Noted: No Callus: No Atrophie Blanche: No Kimberly, [name] ([id number]) Crepitus: No Cyanosis: No Excoriation: Yes Ecchymosis: No Induration: No Erythema: No Rash: No Hemosiderin Staining: No Scarring: No Mottled: No Pallor: No Moisture Rubor: No No Abnormalities Noted: No Dry / Scaly: No  Temperature / Pain Maceration: Yes Temperature: No Abnormality Tenderness on Palpation: Yes Wound Preparation Ulcer Cleansing: Rinsed/Irrigated with Saline Topical Anesthetic Applied: Other: lidocaine 4#, Treatment Notes Wound #2 (Right, Posterior Lower Leg) 1. Cleansed with: Clean wound with Normal Saline 2. Anesthetic Topical Lidocaine 4% cream to wound bed prior to debridement 3. Peri-wound Care: Other peri-wound care (specify in notes) 4. Dressing Applied: Other dressing (specify in notes) 5. Secondary Dressing Applied Non-Adherent pad Notes TCA cream to red areas, silvercell to red wounds, and wrap with kerlix Electronic Signature(s) Signed: 02/06/2017 5:14:03 PM By: Roger Shelter Entered By: Roger Shelter on 02/06/2017 10:26:47 Kimberly [name] ([id number]) -------------------------------------------------------------------------------- Vitals Details Patient Name: Kimberly [name] Date of Service: 02/06/2017 10:15 AM Medical Record Number: [id number] Patient Account Number: [id number] Date of Birth/Sex: 1945/11/21 (71 y.o. Female) Treating RN: Roger Shelter Primary Care Rheda Kassab: Cathlean Cower Other Clinician: Referring Edmund Holcomb: Cathlean Cower Treating Mathea Frieling/Extender: Melburn Hake, HOYT Weeks in Treatment: 9 Vital Signs Time Taken: 10:35 Temperature (F): 98.1 Height (in): 61 Pulse (bpm): 66 Weight (lbs): 111.9 Respiratory Rate (breaths/min): 20 Body Mass Index (BMI): 21.1 Blood Pressure (mmHg): 103/53 Reference Range: 80 - 120 mg / dl Electronic Signature(s) Signed: 02/06/2017 5:14:03 PM By: Roger Shelter Entered By: Roger Shelter on 02/06/2017 10:35:18

## 2017-02-09 NOTE — Progress Notes (Signed)
Kimberly [name], Kimberly [name] ([id number]) Visit Report for 02/06/2017 Chief Complaint Document Details Patient Name: Kimberly [name], Kimberly [name] Date of Service: 02/06/2017 10:15 AM Medical Record Number: [id number] Patient Account Number: [id number] Date of Birth/Sex: [birth year] (71 y.o. Female) Treating RN: Kimberly [name] Primary Care Provider: Cathlean [name] Other Clinician: Referring Provider: Cathlean [name] Treating Provider/Extender: Kimberly [name],  Weeks in Treatment: 9 Information Obtained from: Patient Chief Complaint Patient seen for complaints of Non-Healing Wound to the right lateral calf Electronic Signature(s) Signed: 02/09/2017 11:19:49 AM By: Kimberly Keeler PA-C Entered By: Kimberly [name] on 02/06/2017 10:34:40 Kimberly [name] ([id number]) -------------------------------------------------------------------------------- HPI Details Patient Name: Kimberly [name] Date of Service: 02/06/2017 10:15 AM Medical Record Number: [id number] Patient Account Number: [id number] Date of Birth/Sex: 1945/06/16 (71 y.o. Female) Treating RN: Kimberly [name] Primary Care Provider: Cathlean [name] Other Clinician: Referring Provider: Cathlean [name] Treating Provider/Extender: Kimberly [name],  Weeks in Treatment: 9 History of Present Illness Location: right lateral calf Quality: Patient reports experiencing a sharp pain to affected area(s). Severity: Patient states wound are getting worse. Duration: Patient has had the wound for > 3 months prior to seeking treatment at the wound center Timing: Pain in wound is constant (hurts all the time) Context: The wound occurred when the patient had a injury to her leg with a sharp object which caused a laceration Modifying Factors: Other treatment(s) tried include:of hydrogen peroxide, Neosporin and antibiotics Associated Signs and Symptoms: Patient reports having increase swelling. HPI Description: 72 year old patient was seen by the nurse practitioner at the [facility] primary care for a  wound which has been there on her right leg since August 2018. For a period of time she has been treated with doxycycline, clindamycin and Bactrim and the wound continues to have drainage and odor.on 11/20/2016 she was again put on Bactrim DS and referred to the wound center. Past medical history significant for hypertension, congestive heart failure, COPD, dyslipidemia, migraines, pericarditis, status post VSD repair, cholecystectomy tubal ligation, cesarean section, coronary angiogram,my tenotomy with tube placement on the right,dilated cardiomyopathy,tobacco abuse and has been a smoker for the last 35 years. She also has a history of right great saphenous vein ablation but the patient does not confirm this for sure. She has had a venous duplex study done in August 2018 which showed no evidence of lower extremity deep or superficial venous thrombus or incompetence bilaterally. She has a arterial duplex study pending, which is going to be done later today. 12/22/16 on evaluation today patient appears to be doing fairly well in regard to her right lateral lower extremity wounds. She did undergo a right common femoral artery balloon angioplasty and stent placement which was performed on 12/17/16. Patient this is did appear to be a sufficient improvement according to the reviewed notes which is excellent news. Prior to this procedure patient did have a study of the lower extremity this was a Doppler performed on 12/08/16 this revealed moderate right lower extremity arterial disease involving the common femoral artery and iliac segment. There was also left arterial obstruction involving the iliac segment. On the left there was arterial obstruction involving the iliac segment, common femoral artery, and peroneal artery. Patient states she is still having some soreness left over and residual from the procedure. However overall she appears to be doing very well. She does have a follow-up study with  vascular in the next week. 01/02/2017 -- was seen by Dr. Jacqulyn [name] -- she had an abdominal aortogram with lower extremity peripheral vascular intervention, and this  showed moderate left common iliac artery stenosis. Short occlusion of the right common femoral artery with no significant infrainguinal disease. Short occlusion of the left common femoral artery with no significant infrainguinal disease and a successful drug-coated balloon angioplasty stent was placed in the right common femoral artery. She had a lower extremity arterial duplex examination done which showed the resting right ABI is within normal range and no evidence of significant right lower extremity arterial disease. right ABI is 1.08 with a toe pressure being 0.54. there was biphasic flow. The left ABI was 0.77 and the toe and this was 0.38 with monophasic flow.The right toe brachial indicis were abnormal. The left resting ABI indicates moderate left lower extremity arterial disease in the left toe brachial index is abnormal. 01/09/17 on evaluation today patient presents with continued ulcerations noted of the right lower extremity. This does seem to be doing a little bit better but still is slough covered. She has been tolerating the dressing changes fairly well but states that the debridement does tend to bother her pain wise. No fevers, chills, nausea, or vomiting noted at this time. She does tell me that the alginate dressing that she was looking to use following her last evaluation is expensive and insurance will not cover this it's actually cheaper for her to utilize the [place]. Kimberly [name] ([id number]) 02/06/17 on evaluation today patient's wound appears to be doing excellent at this point in time. In fact she has been tolerating the dressing changes very well there does not even appear to be any evidence or need for debridement today. She's not having any significant pain which is good news. No fevers, chills,  nausea, or vomiting noted at this time. Overall she is extremely pleased with how things have progressed. Electronic Signature(s) Signed: 02/09/2017 11:19:49 AM By: Kimberly Keeler PA-C Entered By: Kimberly [name] on 02/06/2017 12:40:29 Kimberly [name] ([id number]) -------------------------------------------------------------------------------- Physical Exam Details Patient Name: Kimberly [name] Date of Service: 02/06/2017 10:15 AM Medical Record Number: [id number] Patient Account Number: [id number] Date of Birth/Sex: 1945/07/13 (71 y.o. Female) Treating RN: Kimberly [name] Primary Care Provider: Cathlean [name] Other Clinician: Referring Provider: Cathlean [name] Treating Provider/Extender: Kimberly [name],  Weeks in Treatment: 47 Constitutional Well-nourished and well-hydrated in no acute distress. Respiratory normal breathing without difficulty. clear to auscultation bilaterally. Cardiovascular regular rate and rhythm with normal S1, S2. 1+ pitting edema of the bilateral lower extremities. Psychiatric this patient is able to make decisions and demonstrates good insight into disease process. Alert and Oriented x 3. pleasant and cooperative. Notes Patient has an excellent wound bed at this point granulation tissue noted there is no evidence of infection and there is no slough covering that requires debridement today which is the first time that has been the case. Overall she appears to be doing very well. Electronic Signature(s) Signed: 02/09/2017 11:19:49 AM By: Kimberly Keeler PA-C Entered By: Kimberly [name] on 02/06/2017 12:41:17 Kimberly [name] ([id number]) -------------------------------------------------------------------------------- Physician Orders Details Patient Name: Kimberly [name] Date of Service: 02/06/2017 10:15 AM Medical Record Number: [id number] Patient Account Number: [id number] Date of Birth/Sex: [birth year] (71 y.o. Female) Treating RN: Kimberly [name] Primary Care  Provider: Cathlean [name] Other Clinician: Referring Provider: Cathlean [name] Treating Provider/Extender: Kimberly [name],  Weeks in Treatment: 9 Verbal / Phone Orders: No Diagnosis Coding ICD-10 Coding Code Description [phone] Non-pressure chronic ulcer of right calf with fat layer exposed I89.0 Lymphedema, not elsewhere classified I73.9 Peripheral vascular disease, unspecified F17.218 Nicotine dependence, cigarettes, with other  nicotine-induced disorders Wound Cleansing Wound #1 Right,Lateral Lower Leg o Clean wound with Normal Saline. o Cleanse wound with mild soap and water o May Shower, gently pat wound dry prior to applying new dressing. Wound #2 Right,Posterior Lower Leg o Clean wound with Normal Saline. o Cleanse wound with mild soap and water o May Shower, gently pat wound dry prior to applying new dressing. Anesthetic (add to Medication List) Wound #1 Right,Lateral Lower Leg o Topical Lidocaine 4% cream applied to wound bed prior to debridement (In Clinic Only). Wound #2 Right,Posterior Lower Leg o Topical Lidocaine 4% cream applied to wound bed prior to debridement (In Clinic Only). Skin Barriers/Peri-Wound Care Wound #1 Right,Lateral Lower Leg o Moisturizing lotion - on lower leg o Triamcinolone Acetonide Ointment Wound #2 Right,Posterior Lower Leg o Moisturizing lotion - on lower leg o Triamcinolone Acetonide Ointment Primary Wound Dressing Wound #1 Right,Lateral Lower Leg o Other: - silvercell, non adherent dressing Wound #2 Right,Posterior Lower Leg o Silvercel Non-Adherent Secondary Dressing Kimberly [name], Kimberly B. ([id number]) Wound #1 Right,Lateral Lower Leg o Non-adherent pad Wound #2 Right,Posterior Lower Leg o Non-adherent pad Dressing Change Frequency Wound #1 Right,Lateral Lower Leg o Other: - every third day Wound #2 Right,Posterior Lower Leg o Other: - every third day Follow-up Appointments o Return Appointment in 2  weeks. Edema Control Wound #1 Right,Lateral Lower Leg o Patient to wear own compression stockings o Elevate legs to the level of the heart and pump ankles as often as possible Wound #2 Right,Posterior Lower Leg o Patient to wear own compression stockings o Elevate legs to the level of the heart and pump ankles as often as possible Additional Orders / Instructions Wound #1 Right,Lateral Lower Leg o Stop Smoking o Increase protein intake. Wound #2 Right,Posterior Lower Leg o Stop Smoking o Increase protein intake. Medications-please add to medication list. Wound #1 Right,Lateral Lower Leg o Other: - Vitamin C, Zinc, MVI Wound #2 Right,Posterior Lower Leg o Other: - Vitamin C, Zinc, MVI Notes wrap with kerlix Electronic Signature(s) Signed: 02/06/2017 5:14:03 PM By: Kimberly [name] Signed: 02/09/2017 11:19:49 AM By: Kimberly Keeler PA-C Entered By: Kimberly [name] on 02/06/2017 10:57:16 Kimberly [name] ([id number]) -------------------------------------------------------------------------------- Problem List Details Patient Name: Kimberly [name] Date of Service: 02/06/2017 10:15 AM Medical Record Number: [id number] Patient Account Number: [id number] Date of Birth/Sex: 1945-03-01 (71 y.o. Female) Treating RN: Kimberly [name] Primary Care Provider: Cathlean [name] Other Clinician: Referring Provider: Cathlean [name] Treating Provider/Extender: Kimberly [name],  Weeks in Treatment: 9 Active Problems ICD-10 Encounter Code Description Active Date Diagnosis L97.212 Non-pressure chronic ulcer of right calf with fat layer exposed 12/05/2016 Yes I89.0 Lymphedema, not elsewhere classified 12/05/2016 Yes I73.9 Peripheral vascular disease, unspecified 12/05/2016 Yes F17.218 Nicotine dependence, cigarettes, with other nicotine-induced 12/05/2016 Yes disorders Inactive Problems Resolved Problems Electronic Signature(s) Signed: 02/09/2017 11:19:49 AM By: Kimberly Keeler  PA-C Entered By: Kimberly [name] on 02/06/2017 10:34:33 Kimberly [name] ([id number]) -------------------------------------------------------------------------------- Progress Note Details Patient Name: Kimberly [name] Date of Service: 02/06/2017 10:15 AM Medical Record Number: [id number] Patient Account Number: [id number] Date of Birth/Sex: 1945-09-30 (71 y.o. Female) Treating RN: Kimberly [name] Primary Care Provider: Cathlean [name] Other Clinician: Referring Provider: Cathlean [name] Treating Provider/Extender: Kimberly [name],  Weeks in Treatment: 9 Subjective Chief Complaint Information obtained from Patient Patient seen for complaints of Non-Healing Wound to the right lateral calf History of Present Illness (HPI) The following HPI elements were documented for the patient's wound: Location: right lateral calf Quality: Patient reports experiencing a sharp pain  to affected area(s). Severity: Patient states wound are getting worse. Duration: Patient has had the wound for > 3 months prior to seeking treatment at the wound center Timing: Pain in wound is constant (hurts all the time) Context: The wound occurred when the patient had a injury to her leg with a sharp object which caused a laceration Modifying Factors: Other treatment(s) tried include:of hydrogen peroxide, Neosporin and antibiotics Associated Signs and Symptoms: Patient reports having increase swelling. 72 year old patient was seen by the nurse practitioner at the [facility] primary care for a wound which has been there on her right leg since August 2018. For a period of time she has been treated with doxycycline, clindamycin and Bactrim and the wound continues to have drainage and odor.on 11/20/2016 she was again put on Bactrim DS and referred to the wound center. Past medical history significant for hypertension, congestive heart failure, COPD, dyslipidemia, migraines, pericarditis, status post VSD repair, cholecystectomy tubal  ligation, cesarean section, coronary angiogram,my tenotomy with tube placement on the right,dilated cardiomyopathy,tobacco abuse and has been a smoker for the last 35 years. She also has a history of right great saphenous vein ablation but the patient does not confirm this for sure. She has had a venous duplex study done in August 2018 which showed no evidence of lower extremity deep or superficial venous thrombus or incompetence bilaterally. She has a arterial duplex study pending, which is going to be done later today. 12/22/16 on evaluation today patient appears to be doing fairly well in regard to her right lateral lower extremity wounds. She did undergo a right common femoral artery balloon angioplasty and stent placement which was performed on 12/17/16. Patient this is did appear to be a sufficient improvement according to the reviewed notes which is excellent news. Prior to this procedure patient did have a study of the lower extremity this was a Doppler performed on 12/08/16 this revealed moderate right lower extremity arterial disease involving the common femoral artery and iliac segment. There was also left arterial obstruction involving the iliac segment. On the left there was arterial obstruction involving the iliac segment, common femoral artery, and peroneal artery. Patient states she is still having some soreness left over and residual from the procedure. However overall she appears to be doing very well. She does have a follow-up study with vascular in the next week. 01/02/2017 -- was seen by Dr. Jacqulyn [name] -- she had an abdominal aortogram with lower extremity peripheral vascular intervention, and this showed moderate left common iliac artery stenosis. Short occlusion of the right common femoral artery with no significant infrainguinal disease. Short occlusion of the left common femoral artery with no significant infrainguinal disease and a successful drug-coated balloon  angioplasty stent was placed in the right common femoral artery. She had a lower extremity arterial duplex examination done which showed the resting right ABI is within normal range and no evidence of significant right lower extremity arterial disease. right ABI is 1.08 with a toe pressure being 0.54. there was biphasic flow. The left ABI was 0.77 and the toe and this was 0.38 with monophasic flow.The right toe brachial indicis were abnormal. The left resting ABI indicates moderate left lower extremity arterial disease in the left toe brachial index is Kimberly [name], Kimberly [name] ([id number]) abnormal. 01/09/17 on evaluation today patient presents with continued ulcerations noted of the right lower extremity. This does seem to be doing a little bit better but still is slough covered. She has been tolerating the dressing changes fairly  well but states that the debridement does tend to bother her pain wise. No fevers, chills, nausea, or vomiting noted at this time. She does tell me that the alginate dressing that she was looking to use following her last evaluation is expensive and insurance will not cover this it's actually cheaper for her to utilize the [place]. 02/06/17 on evaluation today patient's wound appears to be doing excellent at this point in time. In fact she has been tolerating the dressing changes very well there does not even appear to be any evidence or need for debridement today. She's not having any significant pain which is good news. No fevers, chills, nausea, or vomiting noted at this time. Overall she is extremely pleased with how things have progressed. Patient History Information obtained from Patient. Family History Cancer - Father, Heart Disease - Siblings, Hypertension - Mother, Tuberculosis - Father, No family history of Diabetes, Hereditary Spherocytosis, Kidney Disease, Lung Disease, Seizures, Stroke, Thyroid Problems. Social History Current every day smoker - 2-3 day, Marital  Status - Married, Alcohol Use - Never, Drug Use - No History, Caffeine Use - Daily. Review of Systems (ROS) Constitutional Symptoms (General Health) Denies complaints or symptoms of Fever, Chills. Respiratory The patient has no complaints or symptoms. Cardiovascular Complains or has symptoms of LE edema. Psychiatric The patient has no complaints or symptoms. Objective Constitutional Well-nourished and well-hydrated in no acute distress. Vitals Time Taken: 10:35 AM, Height: 61 in, Weight: 111.9 lbs, BMI: 21.1, Temperature: 98.1 F, Pulse: 66 bpm, Respiratory Rate: 20 breaths/min, Blood Pressure: 103/53 mmHg. Respiratory normal breathing without difficulty. clear to auscultation bilaterally. Cardiovascular regular rate and rhythm with normal S1, S2. 1+ pitting edema of the bilateral lower extremities. Psychiatric Kimberly [name], Kimberly [name] ([id number]) this patient is able to make decisions and demonstrates good insight into disease process. Alert and Oriented x 3. pleasant and cooperative. General Notes: Patient has an excellent wound bed at this point granulation tissue noted there is no evidence of infection and there is no slough covering that requires debridement today which is the first time that has been the case. Overall she appears to be doing very well. Integumentary (Hair, Skin) Wound #1 status is Open. Original cause of wound was Trauma. The wound is located on the Right,Lateral Lower Leg. The wound measures 0.1cm length x 0.1cm width x 0.1cm depth; 0.008cm^2 area and 0.001cm^3 volume. There is no tunneling or undermining noted. There is a none present amount of drainage noted. The wound margin is flat and intact. There is large (67-100%) red granulation within the wound bed. There is no necrotic tissue within the wound bed. The periwound skin appearance exhibited: Excoriation. The periwound skin appearance did not exhibit: Callus, Crepitus, Induration, Rash, Scarring, Dry/Scaly,  Maceration, Atrophie Blanche, Cyanosis, Ecchymosis, Hemosiderin Staining, Mottled, Pallor, Rubor, Erythema. Periwound temperature was noted as No Abnormality. The periwound has tenderness on palpation. Wound #2 status is Open. Original cause of wound was Trauma. The wound is located on the Right,Posterior Lower Leg. The wound measures 0.6cm length x 0.5cm width x 0.1cm depth; 0.236cm^2 area and 0.024cm^3 volume. There is Fat Layer (Subcutaneous Tissue) Exposed exposed. There is no tunneling or undermining noted. There is a small amount of serous drainage noted. The wound margin is distinct with the outline attached to the wound base. There is large (67-100%) red, pink granulation within the wound bed. There is a small (1-33%) amount of necrotic tissue within the wound bed including Adherent Slough. The periwound skin appearance exhibited: Excoriation, Maceration.  The periwound skin appearance did not exhibit: Callus, Crepitus, Induration, Rash, Scarring, Dry/Scaly, Atrophie Blanche, Cyanosis, Ecchymosis, Hemosiderin Staining, Mottled, Pallor, Rubor, Erythema. Periwound temperature was noted as No Abnormality. The periwound has tenderness on palpation. Assessment Active Problems ICD-10 L97.212 - Non-pressure chronic ulcer of right calf with fat layer exposed I89.0 - Lymphedema, not elsewhere classified I73.9 - Peripheral vascular disease, unspecified F17.218 - Nicotine dependence, cigarettes, with other nicotine-induced disorders Plan Wound Cleansing: Wound #1 Right,Lateral Lower Leg: Clean wound with Normal Saline. Cleanse wound with mild soap and water May Shower, gently pat wound dry prior to applying new dressing. Wound #2 Right,Posterior Lower Leg: Clean wound with Normal Saline. Cleanse wound with mild soap and water May Shower, gently pat wound dry prior to applying new dressing. Anesthetic (add to Medication List): Wound #1 Right,Lateral Lower Leg: Kimberly [name], Kimberly B.  ([id number]) Topical Lidocaine 4% cream applied to wound bed prior to debridement (In Clinic Only). Wound #2 Right,Posterior Lower Leg: Topical Lidocaine 4% cream applied to wound bed prior to debridement (In Clinic Only). Skin Barriers/Peri-Wound Care: Wound #1 Right,Lateral Lower Leg: Moisturizing lotion - on lower leg Triamcinolone Acetonide Ointment Wound #2 Right,Posterior Lower Leg: Moisturizing lotion - on lower leg Triamcinolone Acetonide Ointment Primary Wound Dressing: Wound #1 Right,Lateral Lower Leg: Other: - silvercell, non adherent dressing Wound #2 Right,Posterior Lower Leg: Silvercel Non-Adherent Secondary Dressing: Wound #1 Right,Lateral Lower Leg: Non-adherent pad Wound #2 Right,Posterior Lower Leg: Non-adherent pad Dressing Change Frequency: Wound #1 Right,Lateral Lower Leg: Other: - every third day Wound #2 Right,Posterior Lower Leg: Other: - every third day Follow-up Appointments: Return Appointment in 2 weeks. Edema Control: Wound #1 Right,Lateral Lower Leg: Patient to wear own compression stockings Elevate legs to the level of the heart and pump ankles as often as possible Wound #2 Right,Posterior Lower Leg: Patient to wear own compression stockings Elevate legs to the level of the heart and pump ankles as often as possible Additional Orders / Instructions: Wound #1 Right,Lateral Lower Leg: Stop Smoking Increase protein intake. Wound #2 Right,Posterior Lower Leg: Stop Smoking Increase protein intake. Medications-please add to medication list.: Wound #1 Right,Lateral Lower Leg: Other: - Vitamin C, Zinc, MVI Wound #2 Right,Posterior Lower Leg: Other: - Vitamin C, Zinc, MVI General Notes: wrap with kerlix I'm going to recommend that we continue with the Current wound care measures as she seems to be doing well. We are gonna see her for reevaluation in two weeks time to see were things stand at that point. Patient is in agreement with the plan. The  good news is she has got her compression stockings as well that she can wear this is going to be a big help for her. Please see above for specific wound care orders. We will see patient for re-evaluation in 2 week(s) here in the clinic. If anything worsens or changes patient will contact our office for additional recommendations. Kimberly [name], Kimberly [name] ([id number]) Electronic Signature(s) Signed: 02/09/2017 11:19:49 AM By: Kimberly Keeler PA-C Entered By: Kimberly [name] on 02/06/2017 12:41:45 Kimberly [name] ([id number]) -------------------------------------------------------------------------------- ROS/PFSH Details Patient Name: Kimberly [name] Date of Service: 02/06/2017 10:15 AM Medical Record Number: [id number] Patient Account Number: [id number] Date of Birth/Sex: Apr 18, 1945 (71 y.o. Female) Treating RN: Kimberly [name] Primary Care Provider: Cathlean [name] Other Clinician: Referring Provider: Cathlean [name] Treating Provider/Extender: Kimberly [name],  Weeks in Treatment: 9 Information Obtained From Patient Wound History Do you currently have one or more open woundso Yes How many open wounds do you currently haveo 1  Approximately how long have you had your woundso aug 2018 How have you been treating your wound(s) until nowo hydrogel ag Has your wound(s) ever healed and then re-openedo No Have you had any lab work done in the past montho No Have you tested positive for an antibiotic resistant organism (MRSA, VRE)o No Have you tested positive for osteomyelitis (bone infection)o No Have you had any tests for circulation on your legso No Have you had other problems associated with your woundso Swelling Constitutional Symptoms (General Health) Complaints and Symptoms: Negative for: Fever; Chills Cardiovascular Complaints and Symptoms: Positive for: LE edema Medical History: Positive for: Congestive Heart Failure Hematologic/Lymphatic Medical History: Positive for: Anemia -  hx Respiratory Complaints and Symptoms: No Complaints or Symptoms Medical History: Positive for: Asthma; Chronic Obstructive Pulmonary Disease (COPD) Psychiatric Complaints and Symptoms: No Complaints or Symptoms Immunizations Pneumococcal Vaccine: Received Pneumococcal Vaccination: Yes Kimberly [name], Kimberly [name] ([id number]) Implantable Devices Family and Social History Cancer: Yes - Father; Diabetes: No; Heart Disease: Yes - Siblings; Hereditary Spherocytosis: No; Hypertension: Yes - Mother; Kidney Disease: No; Lung Disease: No; Seizures: No; Stroke: No; Thyroid Problems: No; Tuberculosis: Yes - Father; Current every day smoker - 2-3 day; Marital Status - Married; Alcohol Use: Never; Drug Use: No History; Caffeine Use: Daily; Financial Concerns: No; Food, Clothing or [name] Needs: No; Support System Lacking: No; Transportation Concerns: No; Advanced Directives: No; Patient does not want information on Advanced Directives; Do not resuscitate: No; Living Will: No; Medical Power of Attorney: No Physician Affirmation I have reviewed and agree with the above information. Electronic Signature(s) Signed: 02/06/2017 5:14:03 PM By: Kimberly [name] Signed: 02/09/2017 11:19:49 AM By: Kimberly Keeler PA-C Entered By: Kimberly [name] on 02/06/2017 12:40:48 Kimberly [name] ([id number]) -------------------------------------------------------------------------------- SuperBill Details Patient Name: Kimberly [name] Date of Service: 02/06/2017 Medical Record Number: [id number] Patient Account Number: [id number] Date of Birth/Sex: 11-Mar-1945 (71 y.o. Female) Treating RN: Kimberly [name] Primary Care Provider: Cathlean [name] Other Clinician: Referring Provider: Cathlean [name] Treating Provider/Extender: Kimberly [name],  Weeks in Treatment: 9 Diagnosis Coding ICD-10 Codes Code Description [phone] Non-pressure chronic ulcer of right calf with fat layer exposed I89.0 Lymphedema, not elsewhere classified I73.9  Peripheral vascular disease, unspecified F17.218 Nicotine dependence, cigarettes, with other nicotine-induced disorders Facility Procedures CPT4 Code: 02409735 Description: 99213 - WOUND CARE VISIT-LEV 3 EST PT Modifier: Quantity: 1 Physician Procedures CPT4 Code: 3299242 Description: 68341 - WC PHYS LEVEL 3 - EST PT ICD-10 Diagnosis Description L97.212 Non-pressure chronic ulcer of right calf with fat layer ex I89.0 Lymphedema, not elsewhere classified I73.9 Peripheral vascular disease, unspecified F17.218 Nicotine  dependence, cigarettes, with other nicotine-induc Modifier: posed ed disorders Quantity: 1 Electronic Signature(s) Signed: 02/09/2017 11:19:49 AM By: Kimberly Keeler PA-C Entered By: Kimberly [name] on 02/06/2017 12:42:15

## 2017-02-10 ENCOUNTER — Ambulatory Visit: Payer: Medicare Other | Admitting: Physician Assistant

## 2017-02-10 ENCOUNTER — Encounter: Payer: Self-pay | Admitting: Cardiovascular Disease

## 2017-02-10 ENCOUNTER — Ambulatory Visit: Payer: Medicare Other | Admitting: Cardiovascular Disease

## 2017-02-10 VITALS — BP 110/60 | HR 53 | Ht 61.0 in | Wt 108.5 lb

## 2017-02-10 DIAGNOSIS — Z720 Tobacco use: Secondary | ICD-10-CM

## 2017-02-10 DIAGNOSIS — I10 Essential (primary) hypertension: Secondary | ICD-10-CM | POA: Diagnosis not present

## 2017-02-10 DIAGNOSIS — E785 Hyperlipidemia, unspecified: Secondary | ICD-10-CM | POA: Diagnosis not present

## 2017-02-10 DIAGNOSIS — I5022 Chronic systolic (congestive) heart failure: Secondary | ICD-10-CM | POA: Diagnosis not present

## 2017-02-10 DIAGNOSIS — I739 Peripheral vascular disease, unspecified: Secondary | ICD-10-CM | POA: Diagnosis not present

## 2017-02-10 MED ORDER — CARVEDILOL 3.125 MG PO TABS: 3.1250 mg | ORAL_TABLET | Freq: Two times a day (BID) | ORAL | 1 refills | Status: DC

## 2017-02-10 NOTE — Progress Notes (Signed)
Cardiology Office Note   Date:  02/10/2017   ID:  Ardath, Lepak [birth year], MRN [id number]  PCP:  Biagio Borg, MD  Cardiologist: Dr. Harrington Challenger  Chief Complaint  Patient presents with  . other    1 month f/u no complaints today. Meds reviewed verbally with pt.      History of Present Illness: Kimberly [name] is a 72 y.o. female who is here today for a follow-up visit regarding peripheral arterial disease.    She has history of congenital heart disease status post VSD repair with multiple prior catheterizations via bilateral femoral arteries when she was a child, SVT status post ablation, hyperlipidemia, COPD, tobacco use, transient atrial fibrillation and chronic systolic heart failure. Patient was seen for peripheral arterial disease with nonhealing wound on the right lateral leg above the ankle. She underwent recent noninvasive vascular evaluation which showed a right ABI of 0.62 and left of 0.72. I proceeded with an angiogram in November which showed moderate left common iliac artery stenosis, short occlusion of right common femoral artery with no significant infrainguinal disease and short occlusion of the left common femoral artery with no significant infrainguinal disease.  I performed successful drug-coated balloon angioplasty and self-expanding stent placement to the right common femoral artery without complications.  I felt that she was not a good candidate for femoral endarterectomy due to her cardiopulmonary status. She has been doing well with significant improvement in the wound on the right side.  She establish with the heart failure clinic and has been feeling better with medical therapy.  She has no left leg claudication.  No wounds on the left side.    Past Medical History:  Diagnosis Date  . Anxiety   . Atrial tachycardia ([place])   . Bursitis of shoulder, right, adhesive   . CHF (congestive heart failure) ([place])   . Chronic bronchitis ([place])    "1-2 times/yr"  (01/23/2014)  . COPD (chronic obstructive pulmonary disease) ([place])   . CVD (cerebrovascular disease)   . Dyslipidemia   . Dysrhythmia   . Frequency of urination   . GERD (gastroesophageal reflux disease)   . Heart murmur   . History of stomach ulcers   . HTN (hypertension) 02/22/2011  . Migraines    "stopped many years ago" (06/14/2014)  . Osteoporosis 08/19/2016  . Pericarditis   . Pneumonia "10 times" (06/14/2014)  . 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 Silent myocardial infarction ([place]) "late 1990's"  . Stress incontinence    "was suppose to have been tacked up years ago but I didn't do it"  . Syncope, near    Associated with atrial tachycardia-event recorder 1/16  . Thoracic outlet syndrome   . VSD (ventricular septal defect)     Past Surgical History:  Procedure Laterality Date  . ABDOMINAL AORTOGRAM W/LOWER EXTREMITY N/A 12/17/2016   Procedure: ABDOMINAL AORTOGRAM W/LOWER EXTREMITY;  Surgeon: Wellington Hampshire, MD;  Location: [facility];  Service: Cardiovascular;  Laterality: N/A;  . CARDIAC CATHETERIZATION  "quite a few"  . [place]  . CHOLECYSTECTOMY OPEN  1970's  . CORONARY ANGIOGRAM  2000   No significant CAD  . ELECTROPHYSIOLOGIC STUDY N/A 06/14/2014   Procedure: A-Flutter/A-Tach/SVT Ablation;  Surgeon: Evans Lance, MD;  Location: [facility];  Service: Cardiovascular;  Laterality: N/A;  . MYRINGOTOMY WITH TUBE PLACEMENT Right 2015  . PERIPHERAL VASCULAR INTERVENTION  12/17/2016   Procedure: PERIPHERAL VASCULAR  INTERVENTION;  Surgeon: Wellington Hampshire, MD;  Location: [facility];  Service: Cardiovascular;;  Right common femoral PTA and Stent  . RIGHT/LEFT HEART CATH AND CORONARY ANGIOGRAPHY Bilateral 11/06/2016   Procedure: RIGHT/LEFT HEART CATH AND CORONARY ANGIOGRAPHY;  Surgeon: Wellington Hampshire, MD;  Location: [facility];  Service: Cardiovascular;  Laterality: Bilateral;  . SVT  ABLATION  06/14/2014  . TUBAL LIGATION  1972  . VSD REPAIR  1958; 1967     Current Outpatient Medications  Medication Sig Dispense Refill  . albuterol (VENTOLIN HFA) 108 (90 Base) MCG/ACT inhaler Inhale 2 puffs into the lungs every 6 (six) hours as needed for wheezing or shortness of breath. 18 g 11  . ALPRAZolam (XANAX) 0.25 MG tablet TAKE 1 TABLET BY MOUTH TWICE A DAY AS NEEDED FOR ANXIETY. 60 tablet 2  . aspirin 81 MG tablet Take 1 tablet (81 mg total) by mouth daily. 100 tablet 99  . buPROPion (WELLBUTRIN SR) 150 MG 12 hr tablet Take 1 tablet (150 mg total) by mouth daily for 3 days, THEN 1 tablet (150 mg total) 2 (two) times daily. 60 tablet 3  . carvedilol (COREG) 3.125 MG tablet Take 1 tablet (3.125 mg total) by mouth 2 (two) times daily. 180 tablet 1  . celecoxib (CELEBREX) 100 MG capsule Take 100 mg by mouth 2 (two) times daily as needed for moderate pain.    Marland Kitchen clopidogrel (PLAVIX) 75 MG tablet Take 1 tablet (75 mg total) by mouth daily. 30 tablet 6  . collagenase (SANTYL) ointment Apply 1 application topically daily.    . digoxin (LANOXIN) 0.125 MG tablet Take 1 tablet (0.125 mg total) by mouth daily. 90 tablet 3  . furosemide (LASIX) 20 MG tablet Take 1 tablet (20 mg total) by mouth daily. 30 tablet 3  . Hydrocod Polst-Chlorphen Polst 10-8 MG CP12 Take 5 mLs every 12 (twelve) hours as needed by mouth (for cough). 90 each 0  . ibuprofen (ADVIL,MOTRIN) 200 MG tablet Take 200 mg by mouth daily as needed for headache.    . potassium chloride (K-DUR) 10 MEQ tablet Take 1 tablet (10 mEq total) daily by mouth. (Patient taking differently: Take 10 mEq by mouth 2 (two) times daily. ) 90 tablet 3  . rosuvastatin (CRESTOR) 10 MG tablet TAKE 1 TABLET BY MOUTH EVERY DAY. 90 tablet 3  . sacubitril-valsartan (ENTRESTO) 24-26 MG Take 1 tablet by mouth 2 (two) times daily. 60 tablet 3  . SILVER HYDROGEL GEL Apply 1 inch topically 2 (two) times daily. 1 Tube 0  . spironolactone (ALDACTONE) 25 MG  tablet Take 0.5 tablets (12.5 mg total) by mouth daily. 45 tablet 3   No current facility-administered medications for this visit.     Allergies:   Mupirocin and Codeine    Social History:  The patient  reports that she has been smoking cigarettes.  She has a 11.55 pack-year smoking history. she has never used smokeless tobacco. She reports that she drinks alcohol. She reports that she does not use drugs.   Family History:  The patient's family history includes Alcohol abuse in her other; Arthritis in her other and other; Cancer in her father, mother, and other; Hypertension in her other and unknown relative; Stroke in her other and unknown relative; Throat cancer in her unknown relative.    ROS:  Please see the history of present illness.   Otherwise, review of systems are positive for none.   All other systems are reviewed and  negative.    PHYSICAL EXAM: VS:  BP 110/60 (BP Location: Left Arm, Patient Position: Sitting, Cuff Size: Normal)   Pulse (!) 53   Ht 5\' 1"  (1.549 m)   Wt 108 lb 8 oz (49.2 kg)   BMI 20.50 kg/m  , BMI Body mass index is 20.5 kg/m. GEN: Well nourished, well developed, in no acute distress  HEENT: normal  Neck: no JVD, carotid bruits, or masses Cardiac: RRR; no  rubs, or gallops,no edema.  3 out of 6 holosystolic murmur at the left sternal border and apex. Respiratory:  clear to auscultation bilaterally, normal work of breathing GI: soft, nontender, nondistended, + BS MS: no deformity or atrophy  Skin: warm and dry, no rash Neuro:  Strength and sensation are intact Psych: euthymic mood, full affect Vascular: Radial pulse is absent bilaterally.  Femoral pulse: +2 on the right side and +2 on the left side.  Dorsalis pedis is palpable on the right side.  EKG:  EKG is ordered today. The ekg ordered today demonstrates sinus bradycardia with nonspecific IVCD.   Recent Labs: 07/25/2016: TSH 0.99 08/26/2016: ALT 10 10/01/2016: NT-Pro BNP 3,221 12/15/2016:  Hemoglobin 13.7; Platelets 290 01/26/2017: BUN 18; Creatinine, Ser 1.07; Potassium 4.3; Sodium 138    Lipid Panel    Component Value Date/Time   CHOL 150 07/25/2016 1129   TRIG 55.0 07/25/2016 1129   HDL 78.50 07/25/2016 1129   CHOLHDL 2 07/25/2016 1129   VLDL 11.0 07/25/2016 1129   LDLCALC 61 07/25/2016 1129   LDLDIRECT 112.6 03/16/2013 1057      Wt Readings from Last 3 Encounters:  02/10/17 108 lb 8 oz (49.2 kg)  02/02/17 108 lb (49 kg)  01/08/17 109 lb (49.4 kg)       No flowsheet data found.    ASSESSMENT AND PLAN:  1.  Peripheral arterial disease with critical limb ischemia due to nonhealing wound on the right lower extremity.  Status post successful endovascular revascularization of the right common femoral artery.  ABI improved to normal and duplex showed patent stent.  Continue dual antiplatelet therapy and repeat Doppler studies in 6 months.  She does have short occlusion of the left common femoral artery but currently with no symptoms on that side.  I recommend continuing medical therapy.  2.  Chronic systolic heart failure: Due to nonischemic cardiomyopathy.  She appears to be euvolemic and currently on optimal medical therapy followed at the heart failure clinic.  3.  Hyperlipidemia: Continue treatment with rosuvastatin with a target LDL of less than 70.  4.  Tobacco use: I again discussed with her the importance of smoking cessation and she gradually cut down.    Disposition:   FU with me in 6 month  Signed,  Kathlyn Sacramento, MD  02/10/2017 4:51 PM    [facility]

## 2017-02-10 NOTE — Patient Instructions (Signed)
Medication Instructions:  Your physician recommends that you continue on your current medications as directed. Please refer to the Current Medication list given to you today.   Labwork: none  Testing/Procedures: Your physician has requested that you have a lower extremity arterial duplex in [place]. During this test, an ultrasound is used to evaluate arterial blood flow in the legs. Allow one hour for this exam. There are no restrictions or special instructions.   Follow-Up: Your physician recommends that you schedule a follow-up appointment with Dr. Fletcher Anon one week after your June arterial duplex study   Any Other Special Instructions Will Be Listed Below (If Applicable).     If you need a refill on your cardiac medications before your next appointment, please call your pharmacy.

## 2017-02-11 ENCOUNTER — Ambulatory Visit: Payer: Medicare Other | Admitting: Nurse Practitioner

## 2017-02-13 ENCOUNTER — Other Ambulatory Visit
Admission: RE | Admit: 2017-02-13 | Discharge: 2017-02-13 | Disposition: A | Discharge status: Home / Self Care | Payer: Medicare Other | Source: Ambulatory Visit | Attending: Cardiology | Admitting: Cardiology

## 2017-02-13 DIAGNOSIS — I5022 Chronic systolic (congestive) heart failure: Secondary | ICD-10-CM | POA: Insufficient documentation

## 2017-02-13 LAB — BASIC METABOLIC PANEL
ANION GAP: 8 (ref 5–15)
BUN: 21 mg/dL — ABNORMAL HIGH (ref 6–20)
CO2: 30 mmol/L (ref 22–32)
CREATININE: 1.07 mg/dL — AB (ref 0.44–1.00)
Calcium: 8.9 mg/dL (ref 8.9–10.3)
Chloride: 98 mmol/L — ABNORMAL LOW (ref 101–111)
GFR calc non Af Amer: 52 mL/min — ABNORMAL LOW (ref >60)
GFR, EST AFRICAN AMERICAN: 60 mL/min — AB (ref >60)
Glucose, Bld: 117 mg/dL — ABNORMAL HIGH (ref 65–99)
Potassium: 4.3 mmol/L (ref 3.5–5.1)
Sodium: 136 mmol/L (ref 135–145)

## 2017-02-16 ENCOUNTER — Ambulatory Visit: Payer: Medicare Other | Admitting: Internal Medicine

## 2017-02-18 ENCOUNTER — Encounter: Payer: Self-pay | Admitting: Internal Medicine

## 2017-02-18 ENCOUNTER — Encounter (HOSPITAL_COMMUNITY): Payer: Self-pay | Admitting: *Deleted

## 2017-02-18 ENCOUNTER — Ambulatory Visit (INDEPENDENT_AMBULATORY_CARE_PROVIDER_SITE_OTHER): Payer: Medicare Other | Admitting: Internal Medicine

## 2017-02-18 VITALS — BP 92/64 | HR 61 | Ht 61.0 in | Wt 108.8 lb

## 2017-02-18 DIAGNOSIS — I420 Dilated cardiomyopathy: Secondary | ICD-10-CM | POA: Diagnosis not present

## 2017-02-18 DIAGNOSIS — I5022 Chronic systolic (congestive) heart failure: Secondary | ICD-10-CM | POA: Diagnosis not present

## 2017-02-18 NOTE — Progress Notes (Addendum)
HPI Kimberly [name] is referred today by Dr. Aundra Dubin to consider ICD insertion. She is a pleasant 72 yo woman with a remote h/o atrial tachy, s/p ablation, who has developed worsening CHF. She does not have obstructive CAD. She was found to have a subtotal occlusion of a right common femoral artery and underwent successful revascularization. Since then she has had a nice improvement in her skin lesion on the right lower leg. No ongoing active infection. She is still going to the wound center. She has not had syncope. She has severe LV dysfunction with an EF of 15%. She has class 3 CHF. Her QRS does not show LBBB.  Allergies  Allergen Reactions  . Mupirocin Other (See Comments)    Burning, pain, swelling and sob  . Codeine Nausea Only     Current Outpatient Medications  Medication Sig Dispense Refill  . albuterol (VENTOLIN HFA) 108 (90 Base) MCG/ACT inhaler Inhale 2 puffs into the lungs every 6 (six) hours as needed for wheezing or shortness of breath. 18 g 11  . ALPRAZolam (XANAX) 0.25 MG tablet TAKE 1 TABLET BY MOUTH TWICE A DAY AS NEEDED FOR ANXIETY. 60 tablet 2  . aspirin 81 MG tablet Take 1 tablet (81 mg total) by mouth daily. 100 tablet 99  . buPROPion (WELLBUTRIN SR) 150 MG 12 hr tablet Take 1 tablet (150 mg total) by mouth daily for 3 days, THEN 1 tablet (150 mg total) 2 (two) times daily. 60 tablet 3  . carvedilol (COREG) 3.125 MG tablet Take 1 tablet (3.125 mg total) by mouth 2 (two) times daily. 180 tablet 1  . celecoxib (CELEBREX) 100 MG capsule Take 100 mg by mouth 2 (two) times daily as needed for moderate pain.    Marland Kitchen clopidogrel (PLAVIX) 75 MG tablet Take 1 tablet (75 mg total) by mouth daily. 30 tablet 6  . digoxin (LANOXIN) 0.125 MG tablet Take 1 tablet (0.125 mg total) by mouth daily. 90 tablet 3  . furosemide (LASIX) 20 MG tablet Take 1 tablet (20 mg total) by mouth daily. 30 tablet 3  . Hydrocod Polst-Chlorphen Polst 10-8 MG CP12 Take 5 mLs every 12 (twelve) hours as  needed by mouth (for cough). 90 each 0  . ibuprofen (ADVIL,MOTRIN) 200 MG tablet Take 200 mg by mouth daily as needed for headache.    . potassium chloride (K-DUR) 10 MEQ tablet Take 1 tablet (10 mEq total) daily by mouth. (Patient taking differently: Take 10 mEq by mouth 2 (two) times daily. ) 90 tablet 3  . rosuvastatin (CRESTOR) 10 MG tablet TAKE 1 TABLET BY MOUTH EVERY DAY. 90 tablet 3  . sacubitril-valsartan (ENTRESTO) 24-26 MG Take 1 tablet by mouth 2 (two) times daily. 60 tablet 3   No current facility-administered medications for this visit.      Past Medical History:  Diagnosis Date  . Anxiety   . Atrial tachycardia ([place])   . Bursitis of shoulder, right, adhesive   . CHF (congestive heart failure) ([place])   . Chronic bronchitis ([place])    "1-2 times/yr" (01/23/2014)  . COPD (chronic obstructive pulmonary disease) ([place])   . CVD (cerebrovascular disease)   . Dyslipidemia   . Dysrhythmia   . Frequency of urination   . GERD (gastroesophageal reflux disease)   . Heart murmur   . History of stomach ulcers   . HTN (hypertension) 02/22/2011  . Migraines    "stopped many years ago" (06/14/2014)  . Osteoporosis 08/19/2016  . Pericarditis   .  Pneumonia "10 times" (06/14/2014)  . 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 Silent myocardial infarction ([place]) "late 1990's"  . Stress incontinence    "was suppose to have been tacked up years ago but I didn't do it"  . Syncope, near    Associated with atrial tachycardia-event recorder 1/16  . Thoracic outlet syndrome   . VSD (ventricular septal defect)     ROS:   All systems reviewed and negative except as noted in the HPI.   Past Surgical History:  Procedure Laterality Date  . ABDOMINAL AORTOGRAM W/LOWER EXTREMITY N/A 12/17/2016   Procedure: ABDOMINAL AORTOGRAM W/LOWER EXTREMITY;  Surgeon: Wellington Hampshire, MD;  Location: [facility];  Service: Cardiovascular;  Laterality: N/A;  . CARDIAC  CATHETERIZATION  "quite a few"  . [place]  . CHOLECYSTECTOMY OPEN  1970's  . CORONARY ANGIOGRAM  2000   No significant CAD  . ELECTROPHYSIOLOGIC STUDY N/A 06/14/2014   Procedure: A-Flutter/A-Tach/SVT Ablation;  Surgeon: Evans Lance, MD;  Location: [facility];  Service: Cardiovascular;  Laterality: N/A;  . MYRINGOTOMY WITH TUBE PLACEMENT Right 2015  . PERIPHERAL VASCULAR INTERVENTION  12/17/2016   Procedure: PERIPHERAL VASCULAR INTERVENTION;  Surgeon: Wellington Hampshire, MD;  Location: [facility];  Service: Cardiovascular;;  Right common femoral PTA and Stent  . RIGHT/LEFT HEART CATH AND CORONARY ANGIOGRAPHY Bilateral 11/06/2016   Procedure: RIGHT/LEFT HEART CATH AND CORONARY ANGIOGRAPHY;  Surgeon: Wellington Hampshire, MD;  Location: [facility];  Service: Cardiovascular;  Laterality: Bilateral;  . SVT ABLATION  06/14/2014  . TUBAL LIGATION  1972  . VSD REPAIR  1958; 82     Family History  Problem Relation Age of Onset  . Cancer Mother   . Cancer Father   . Throat cancer Unknown   . Hypertension Unknown   . Stroke Unknown   . Alcohol abuse Other   . Arthritis Other   . Cancer Other        lung cancer  . Hypertension Other   . Arthritis Other   . Stroke Other   . Breast cancer Neg Hx      Social History   Socioeconomic History  . Marital status: Married    Spouse name: Not on file  . Number of children: Not on file  . Years of education: 50  . Highest education level: Not on file  Social Needs  . Financial resource strain: Not on file  . Food insecurity - worry: Not on file  . Food insecurity - inability: Not on file  . Transportation needs - medical: Not on file  . Transportation needs - non-medical: Not on file  Occupational History  . Occupation: retired  Tobacco Use  . Smoking status: Current Every Day Smoker    Packs/day: 0.33    Years: 35.00    Pack years: 11.55    Types: Cigarettes  . Smokeless tobacco: Never Used    . Tobacco comment: Smoked 1971-present , up to 1/2 ppd. Intermittent smoking cessation with preganancy up to 1/2-1 year @ a time  Substance and Sexual Activity  . Alcohol use: Yes    Comment: 06/14/2014 "glass of wine maybe once/month"  . Drug use: No  . Sexual activity: No  Other Topics Concern  . Not on file  Social History Narrative   Pt lives with husband.     BP 92/64   Pulse 61   Ht 5\' 1"  (1.549  m)   Wt 108 lb 12.8 oz (49.4 kg)   SpO2 98%   BMI 20.56 kg/m   Physical Exam:  Well appearing 72 yo woman, NAD HEENT: Unremarkable Neck:  No JVD, no thyromegally Lymphatics:  No adenopathy Back:  No CVA tenderness Lungs:  Clear with no wheezes HEART:  Regular rate rhythm, no murmurs, no rubs, no clicks Abd:  soft, positive bowel sounds, no organomegally, no rebound, no guarding Ext:  2 plus pulses, no edema, no cyanosis, no clubbing Skin:  No rashes no nodules Neuro:  CN II through XII intact, motor grossly intact  EKG - nsr with IVCD  Assess/Plan: 1. Chronic systolic heart failure - she is on maximal medical therapy. I have discussed the treatment options with the patient and her husband and recommended insertion of an ICD for primary prevention of malignant ventricular arrhythmias. She would like to wait until her leg has healed completely. She will call us when she would like to proceed.  2. Peripheral vascular disease - I have discussed the issues regarding healing and infection. I agree that having her legs healed up completely is warrented.  3. SVT - she is s/p ablation with no recurrent arrhythmias.  I spent over 45 minutes including greater than 50% face to face time with the patient preparing her note today.  Mikle Bosworth.D

## 2017-02-18 NOTE — Patient Instructions (Addendum)
Medication Instructions:  Your physician recommends that you continue on your current medications as directed. Please refer to the Current Medication list given to you today.  Labwork: None ordered.  Testing/Procedures: Your physician has recommended that you have a defibrillator inserted. An implantable cardioverter defibrillator (ICD) is a small device that is placed in your chest or, in rare cases, your abdomen. This device uses electrical pulses or shocks to help control life-threatening, irregular heartbeats that could lead the heart to suddenly stop beating (sudden cardiac arrest). Leads are attached to the ICD that goes into your heart. This is done in the hospital and usually requires an overnight stay. Please see the instruction sheet given to you today for more information.  Follow-Up:  The following dates in February are available for your procedure with Dr. Lovena Le. February 6, 8, 11, 14, 19, 20, 25 and 26 If you decide to go ahead with the procedure please call me: Myrtie Hawk, RN with Dr. Lovena Le [phone]  Any Other Special Instructions Will Be Listed Below (If Applicable).   If you need a refill on your cardiac medications before your next appointment, please call your pharmacy.   Cardioverter Defibrillator Implantation An implantable cardioverter defibrillator (ICD) is a small device that is placed under the skin in the chest or abdomen. An ICD consists of a battery, a small computer (pulse generator), and wires (leads) that go into the heart. An ICD is used to detect and correct two types of dangerous irregular heartbeats (arrhythmias):  A rapid heart rhythm (tachycardia).  An arrhythmia in which the lower chambers of the heart (ventricles) contract in an uncoordinated way (fibrillation).  When an ICD detects tachycardia, it sends a low-energy shock to the heart to restore the heartbeat to normal (cardioversion). This signal is usually painless. If cardioversion does  not work or if the ICD detects fibrillation, it delivers a high-energy shock to the heart (defibrillation) to restart the heart. This shock may feel like a strong jolt in the chest. Your health care provider may prescribe an ICD if:  You have had an arrhythmia that originated in the ventricles.  Your heart has been damaged by a disease or heart condition.  Sometimes, ICDs are programmed to act as a device called a pacemaker. Pacemakers can be used to treat a slow heartbeat (bradycardia) or tachycardia by taking over the heart rate with electrical impulses. Tell a health care provider about:  Any allergies you have.  All medicines you are taking, including vitamins, herbs, eye drops, creams, and over-the-counter medicines.  Any problems you or family members have had with anesthetic medicines.  Any blood disorders you have.  Any surgeries you have had.  Any medical conditions you have.  Whether you are pregnant or may be pregnant. What are the risks? Generally, this is a safe procedure. However, problems may occur, including:  Swelling, bleeding, or bruising.  Infection.  Blood clots.  Damage to other structures or organs, such as nerves, blood vessels, or the heart.  Allergic reactions to medicines used during the procedure.  What happens before the procedure? Staying hydrated Follow instructions from your health care provider about hydration, which may include:  Up to 2 hours before the procedure - you may continue to drink clear liquids, such as water, clear fruit juice, black coffee, and plain tea.  Eating and drinking restrictions Follow instructions from your health care provider about eating and drinking, which may include:  8 hours before the procedure - stop eating heavy meals  or foods such as meat, fried foods, or fatty foods.  6 hours before the procedure - stop eating light meals or foods, such as toast or cereal.  6 hours before the procedure - stop  drinking milk or drinks that contain milk.  2 hours before the procedure - stop drinking clear liquids.  Medicine Ask your health care provider about:  Changing or stopping your normal medicines. This is important if you take diabetes medicines or blood thinners.  Taking medicines such as aspirin and ibuprofen. These medicines can thin your blood. Do not take these medicines before your procedure if your doctor tells you not to.  Tests  You may have blood tests.  You may have a test to check the electrical signals in your heart (electrocardiogram, ECG).  You may have imaging tests, such as a chest X-ray. General instructions  For 24 hours before the procedure, stop using products that contain nicotine or tobacco, such as cigarettes and e-cigarettes. If you need help quitting, ask your health care provider.  Plan to have someone take you home from the hospital or clinic.  You may be asked to shower with a germ-killing soap. What happens during the procedure?  To reduce your risk of infection: ? Your health care team will wash or sanitize their hands. ? Your skin will be washed with soap. ? Hair may be removed from the surgical area.  Small monitors will be put on your body. They will be used to check your heart, blood pressure, and oxygen level.  An IV tube will be inserted into one of your veins.  You will be given one or more of the following: ? A medicine to help you relax (sedative). ? A medicine to numb the area (local anesthetic). ? A medicine to make you fall asleep (general anesthetic).  Leads will be guided through a blood vessel into your heart and attached to your heart muscles. Depending on the ICD, the leads may go into one ventricle or they may go into both ventricles and into an upper chamber of the heart. An X-ray machine (fluoroscope) will be usedto help guide the leads.  A small incision will be made to create a deep pocket under your skin.  The pulse  generator will be placed into the pocket.  The ICD will be tested.  The incision will be closed with stitches (sutures), skin glue, or staples.  A bandage (dressing) will be placed over the incision. This procedure may vary among health care providers and hospitals. What happens after the procedure?  Your blood pressure, heart rate, breathing rate, and blood oxygen level will be monitored often until the medicines you were given have worn off.  A chest X-ray will be taken to check that the ICD is in the right place.  You will need to stay in the hospital for 1-2 days so your health care provider can make sure your ICD is working.  Do not drive for 24 hours if you received a sedative. Ask your health care provider when it is safe for you to drive.  You may be given an identification card explaining that you have an ICD. Summary  An implantable cardioverter defibrillator (ICD) is a small device that is placed under the skin in the chest or abdomen. It is used to detect and correct dangerous irregular heartbeats (arrhythmias).  An ICD consists of a battery, a small computer (pulse generator), and wires (leads) that go into the heart.  When an ICD  detects rapid heart rhythm (tachycardia), it sends a low-energy shock to the heart to restore the heartbeat to normal (cardioversion). If cardioversion does not work or if the ICD detects uncoordinated heart contractions (fibrillation), it delivers a high-energy shock to the heart (defibrillation) to restart the heart.  You will need to stay in the hospital for 1-2 days to make sure your ICD is working. This information is not intended to replace advice given to you by your health care provider. Make sure you discuss any questions you have with your health care provider. Document Released: 09/28/2001 Document Revised: 01/16/2016 Document Reviewed: 01/16/2016 Elsevier Interactive Patient Education  2017 [employer].

## 2017-02-18 NOTE — H&P (View-Only) (Signed)
HPI [name] is referred today by Kimberly [name] to consider ICD insertion. She is a pleasant 72 yo woman with a remote h/o atrial tachy, s/p ablation, who has developed worsening CHF. She does not have obstructive CAD. She was found to have a subtotal occlusion of a right common femoral artery and underwent successful revascularization. Since then she has had a nice improvement in her skin lesion on the right lower leg. No ongoing active infection. She is still going to the wound center. She has not had syncope. She has severe LV dysfunction with an EF of 15%. She has class 3 CHF. Her QRS does not show LBBB.  Allergies  Allergen Reactions  . Mupirocin Other (See Comments)    Burning, pain, swelling and sob  . Codeine Nausea Only     Current Outpatient Medications  Medication Sig Dispense Refill  . albuterol (VENTOLIN HFA) 108 (90 Base) MCG/ACT inhaler Inhale 2 puffs into the lungs every 6 (six) hours as needed for wheezing or shortness of breath. 18 g 11  . ALPRAZolam (XANAX) 0.25 MG tablet TAKE 1 TABLET BY MOUTH TWICE A DAY AS NEEDED FOR ANXIETY. 60 tablet 2  . aspirin 81 MG tablet Take 1 tablet (81 mg total) by mouth daily. 100 tablet 99  . buPROPion (WELLBUTRIN SR) 150 MG 12 hr tablet Take 1 tablet (150 mg total) by mouth daily for 3 days, THEN 1 tablet (150 mg total) 2 (two) times daily. 60 tablet 3  . carvedilol (COREG) 3.125 MG tablet Take 1 tablet (3.125 mg total) by mouth 2 (two) times daily. 180 tablet 1  . celecoxib (CELEBREX) 100 MG capsule Take 100 mg by mouth 2 (two) times daily as needed for moderate pain.    Marland Kitchen clopidogrel (PLAVIX) 75 MG tablet Take 1 tablet (75 mg total) by mouth daily. 30 tablet 6  . digoxin (LANOXIN) 0.125 MG tablet Take 1 tablet (0.125 mg total) by mouth daily. 90 tablet 3  . furosemide (LASIX) 20 MG tablet Take 1 tablet (20 mg total) by mouth daily. 30 tablet 3  . Hydrocod Polst-Chlorphen Polst 10-8 MG CP12 Take 5 mLs every 12 (twelve) hours as  needed by mouth (for cough). 90 each 0  . ibuprofen (ADVIL,MOTRIN) 200 MG tablet Take 200 mg by mouth daily as needed for headache.    . potassium chloride (K-DUR) 10 MEQ tablet Take 1 tablet (10 mEq total) daily by mouth. (Patient taking differently: Take 10 mEq by mouth 2 (two) times daily. ) 90 tablet 3  . rosuvastatin (CRESTOR) 10 MG tablet TAKE 1 TABLET BY MOUTH EVERY DAY. 90 tablet 3  . sacubitril-valsartan (ENTRESTO) 24-26 MG Take 1 tablet by mouth 2 (two) times daily. 60 tablet 3   No current facility-administered medications for this visit.      Past Medical History:  Diagnosis Date  . Anxiety   . Atrial tachycardia ([place])   . Bursitis of shoulder, right, adhesive   . CHF (congestive heart failure) ([place])   . Chronic bronchitis ([place])    "1-2 times/yr" (01/23/2014)  . COPD (chronic obstructive pulmonary disease) ([place])   . CVD (cerebrovascular disease)   . Dyslipidemia   . Dysrhythmia   . Frequency of urination   . GERD (gastroesophageal reflux disease)   . Heart murmur   . History of stomach ulcers   . HTN (hypertension) 02/22/2011  . Migraines    "stopped many years ago" (06/14/2014)  . Osteoporosis 08/19/2016  . Pericarditis   .  Pneumonia "10 times" (06/14/2014)  . 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 Silent myocardial infarction ([place]) "late 1990's"  . Stress incontinence    "was suppose to have been tacked up years ago but I didn't do it"  . Syncope, near    Associated with atrial tachycardia-event recorder 1/16  . Thoracic outlet syndrome   . VSD (ventricular septal defect)     ROS:   All systems reviewed and negative except as noted in the HPI.   Past Surgical History:  Procedure Laterality Date  . ABDOMINAL AORTOGRAM W/LOWER EXTREMITY N/A 12/17/2016   Procedure: ABDOMINAL AORTOGRAM W/LOWER EXTREMITY;  Surgeon: Kimberly Hampshire, MD;  Location: [facility];  Service: Cardiovascular;  Laterality: N/A;  . CARDIAC  CATHETERIZATION  "quite a few"  . [place]  . CHOLECYSTECTOMY OPEN  1970's  . CORONARY ANGIOGRAM  2000   No significant CAD  . ELECTROPHYSIOLOGIC STUDY N/A 06/14/2014   Procedure: A-Flutter/A-Tach/SVT Ablation;  Surgeon: Kimberly Lance, MD;  Location: [facility];  Service: Cardiovascular;  Laterality: N/A;  . MYRINGOTOMY WITH TUBE PLACEMENT Right 2015  . PERIPHERAL VASCULAR INTERVENTION  12/17/2016   Procedure: PERIPHERAL VASCULAR INTERVENTION;  Surgeon: Kimberly Hampshire, MD;  Location: [facility];  Service: Cardiovascular;;  Right common femoral PTA and Stent  . RIGHT/LEFT HEART CATH AND CORONARY ANGIOGRAPHY Bilateral 11/06/2016   Procedure: RIGHT/LEFT HEART CATH AND CORONARY ANGIOGRAPHY;  Surgeon: Kimberly Hampshire, MD;  Location: [facility];  Service: Cardiovascular;  Laterality: Bilateral;  . SVT ABLATION  06/14/2014  . TUBAL LIGATION  1972  . VSD REPAIR  1958; 54     Family History  Problem Relation Age of Onset  . Cancer Mother   . Cancer Father   . Throat cancer Unknown   . Hypertension Unknown   . Stroke Unknown   . Alcohol abuse Other   . Arthritis Other   . Cancer Other        lung cancer  . Hypertension Other   . Arthritis Other   . Stroke Other   . Breast cancer Neg Hx      Social History   Socioeconomic History  . Marital status: Married    Spouse name: Not on file  . Number of children: Not on file  . Years of education: 11  . Highest education level: Not on file  Social Needs  . Financial resource strain: Not on file  . Food insecurity - worry: Not on file  . Food insecurity - inability: Not on file  . Transportation needs - medical: Not on file  . Transportation needs - non-medical: Not on file  Occupational History  . Occupation: retired  Tobacco Use  . Smoking status: Current Every Day Smoker    Packs/day: 0.33    Years: 35.00    Pack years: 11.55    Types: Cigarettes  . Smokeless tobacco: Never Used    . Tobacco comment: Smoked 1971-present , up to 1/2 ppd. Intermittent smoking cessation with preganancy up to 1/2-1 year @ a time  Substance and Sexual Activity  . Alcohol use: Yes    Comment: 06/14/2014 "glass of wine maybe once/month"  . Drug use: No  . Sexual activity: No  Other Topics Concern  . Not on file  Social History Narrative   Pt lives with husband.     BP 92/64   Pulse 61   Ht 5\' 1"  (1.549  m)   Wt 108 lb 12.8 oz (49.4 kg)   SpO2 98%   BMI 20.56 kg/m   Physical Exam:  Well appearing 73 yo woman, NAD HEENT: Unremarkable Neck:  No JVD, no thyromegally Lymphatics:  No adenopathy Back:  No CVA tenderness Lungs:  Clear with no wheezes HEART:  Regular rate rhythm, no murmurs, no rubs, no clicks Abd:  soft, positive bowel sounds, no organomegally, no rebound, no guarding Ext:  2 plus pulses, no edema, no cyanosis, no clubbing Skin:  No rashes no nodules Neuro:  CN II through XII intact, motor grossly intact  EKG - nsr with IVCD  Assess/Plan: 1. Chronic systolic heart failure - she is on maximal medical therapy. I have discussed the treatment options with the patient and her husband and recommended insertion of an ICD for primary prevention of malignant ventricular arrhythmias. She would like to wait until her leg has healed completely. She will call us when she would like to proceed.  2. Peripheral vascular disease - I have discussed the issues regarding healing and infection. I agree that having her legs healed up completely is warrented.  3. SVT - she is s/p ablation with no recurrent arrhythmias.  I spent over 45 minutes including greater than 50% face to face time with the patient preparing her note today.  Mikle Bosworth.D

## 2017-02-20 ENCOUNTER — Encounter: Payer: Medicare Other | Attending: Physician Assistant | Admitting: Physician Assistant

## 2017-02-20 DIAGNOSIS — F17210 Nicotine dependence, cigarettes, uncomplicated: Secondary | ICD-10-CM | POA: Diagnosis not present

## 2017-02-20 DIAGNOSIS — I420 Dilated cardiomyopathy: Secondary | ICD-10-CM | POA: Diagnosis not present

## 2017-02-20 DIAGNOSIS — I509 Heart failure, unspecified: Secondary | ICD-10-CM | POA: Diagnosis not present

## 2017-02-20 DIAGNOSIS — E785 Hyperlipidemia, unspecified: Secondary | ICD-10-CM | POA: Diagnosis not present

## 2017-02-20 DIAGNOSIS — J449 Chronic obstructive pulmonary disease, unspecified: Secondary | ICD-10-CM | POA: Diagnosis not present

## 2017-02-20 DIAGNOSIS — I739 Peripheral vascular disease, unspecified: Secondary | ICD-10-CM | POA: Insufficient documentation

## 2017-02-20 DIAGNOSIS — I110 Hypertensive heart disease with heart failure: Secondary | ICD-10-CM | POA: Diagnosis not present

## 2017-02-20 DIAGNOSIS — I890 Lymphedema, not elsewhere classified: Secondary | ICD-10-CM | POA: Insufficient documentation

## 2017-02-20 DIAGNOSIS — L97212 Non-pressure chronic ulcer of right calf with fat layer exposed: Secondary | ICD-10-CM | POA: Diagnosis not present

## 2017-02-20 DIAGNOSIS — L97219 Non-pressure chronic ulcer of right calf with unspecified severity: Secondary | ICD-10-CM | POA: Diagnosis not present

## 2017-02-22 NOTE — Progress Notes (Signed)
Kimberly [name], Kimberly [name] ([id number]) Visit Report for 02/20/2017 Chief Complaint Document Details Patient Name: Kimberly [name], Kimberly [name] Date of Service: 02/20/2017 10:15 AM Medical Record Number: [id number] Patient Account Number: [id number] Date of Birth/Sex: 1945-11-29 (71 y.o. Female) Treating RN: Ahmed Prima Primary Care Provider: Cathlean Cower Other Clinician: Referring Provider: Cathlean Cower Treating Provider/Extender: Melburn Hake, HOYT Weeks in Treatment: 11 Information Obtained from: Patient Chief Complaint Patient seen for complaints of Non-Healing Wound to the right lateral calf Electronic Signature(s) Signed: 02/20/2017 12:52:48 PM By: Worthy Keeler PA-C Entered By: Worthy Keeler on 02/20/2017 11:11:25 Kimberly [name] ([id number]) -------------------------------------------------------------------------------- HPI Details Patient Name: Kimberly [name] Date of Service: 02/20/2017 10:15 AM Medical Record Number: [id number] Patient Account Number: [id number] Date of Birth/Sex: 1945-03-10 (71 y.o. Female) Treating RN: Ahmed Prima Primary Care Provider: Cathlean Cower Other Clinician: Referring Provider: Cathlean Cower Treating Provider/Extender: Melburn Hake, HOYT Weeks in Treatment: 11 History of Present Illness HPI Description: 72 year old patient was seen by the nurse practitioner at the [facility] primary care for a wound which has been there on her right leg since August 2018. For a period of time she has been treated with doxycycline, clindamycin and Bactrim and the wound continues to have drainage and odor.on 11/20/2016 she was again put on Bactrim DS and referred to the wound center. Past medical history significant for hypertension, congestive heart failure, COPD, dyslipidemia, migraines, pericarditis, status post VSD repair, cholecystectomy tubal ligation, cesarean section, coronary angiogram,my tenotomy with tube placement on the right,dilated cardiomyopathy,tobacco abuse and has been a smoker for  the last 35 years. She also has a history of right great saphenous vein ablation but the patient does not confirm this for sure. She has had a venous duplex study done in August 2018 which showed no evidence of lower extremity deep or superficial venous thrombus or incompetence bilaterally. She has a arterial duplex study pending, which is going to be done later today. 12/22/16 on evaluation today patient appears to be doing fairly well in regard to her right lateral lower extremity wounds. She did undergo a right common femoral artery balloon angioplasty and stent placement which was performed on 12/17/16. Patient this is did appear to be a sufficient improvement according to the reviewed notes which is excellent news. Prior to this procedure patient did have a study of the lower extremity this was a Doppler performed on 12/08/16 this revealed moderate right lower extremity arterial disease involving the common femoral artery and iliac segment. There was also left arterial obstruction involving the iliac segment. On the left there was arterial obstruction involving the iliac segment, common femoral artery, and peroneal artery. Patient states she is still having some soreness left over and residual from the procedure. However overall she appears to be doing very well. She does have a follow-up study with vascular in the next week. 01/02/2017 -- was seen by Dr. Jacqulyn Cane -- she had an abdominal aortogram with lower extremity peripheral vascular intervention, and this showed moderate left common iliac artery stenosis. Short occlusion of the right common femoral artery with no significant infrainguinal disease. Short occlusion of the left common femoral artery with no significant infrainguinal disease and a successful drug-coated balloon angioplasty stent was placed in the right common femoral artery. She had a lower extremity arterial duplex examination done which showed the resting right ABI is  within normal range and no evidence of significant right lower extremity arterial disease. right ABI is 1.08 with a toe pressure being 0.54. there was biphasic  flow. The left ABI was 0.77 and the toe and this was 0.38 with monophasic flow.The right toe brachial indicis were abnormal. The left resting ABI indicates moderate left lower extremity arterial disease in the left toe brachial index is abnormal. 01/09/17 on evaluation today patient presents with continued ulcerations noted of the right lower extremity. This does seem to be doing a little bit better but still is slough covered. She has been tolerating the dressing changes fairly well but states that the debridement does tend to bother her pain wise. No fevers, chills, nausea, or vomiting noted at this time. She does tell me that the alginate dressing that she was looking to use following her last evaluation is expensive and insurance will not cover this it's actually cheaper for her to utilize the [place]. 02/06/17 on evaluation today patient's wound appears to be doing excellent at this point in time. In fact she has been tolerating the dressing changes very well there does not even appear to be any evidence or need for debridement today. She's not having any significant pain which is good news. No fevers, chills, nausea, or vomiting noted at this time. Overall she is extremely pleased with how things have progressed. 02/20/17 on evaluation today patient appears to be doing well in regard to her right lower extremity ulcer. In fact this appears to be completely healed. She still has some dry pinpoint scab like areas noted over the lower extremity but fortunately nothing [place], Kimberly B. ([id number]) significant and none of these seem to have any wound underneath when they come off it is completely healed underneath. Nonetheless we are gonna let this work itself off not try to do anything to invasive. Electronic Signature(s) Signed: 02/20/2017  12:52:48 PM By: Worthy Keeler PA-C Entered By: Worthy Keeler on 02/20/2017 11:22:02 Kimberly [name] ([id number]) -------------------------------------------------------------------------------- Physical Exam Details Patient Name: Kimberly [name] Date of Service: 02/20/2017 10:15 AM Medical Record Number: [id number] Patient Account Number: [id number] Date of Birth/Sex: 10/02/1945 (71 y.o. Female) Treating RN: Ahmed Prima Primary Care Provider: Cathlean Cower Other Clinician: Referring Provider: Cathlean Cower Treating Provider/Extender: Melburn Hake, HOYT Weeks in Treatment: 11 Constitutional Thin and well-hydrated in no acute distress. Respiratory normal breathing without difficulty. Psychiatric this patient is able to make decisions and demonstrates good insight into disease process. Alert and Oriented x 3. pleasant and cooperative. Notes Testing erythema secondary to just secondary to inflammation at this point in regard to the right lower extremity. Fortunately there does not appear to be any evidence of infection. This does appear to be completely healed. Electronic Signature(s) Signed: 02/20/2017 12:52:48 PM By: Worthy Keeler PA-C Entered By: Worthy Keeler on 02/20/2017 11:24:17 Kimberly [name] ([id number]) -------------------------------------------------------------------------------- Physician Orders Details Patient Name: Kimberly [name] Date of Service: 02/20/2017 10:15 AM Medical Record Number: [id number] Patient Account Number: [id number] Date of Birth/Sex: July 25, 1945 (71 y.o. Female) Treating RN: Carolyne Fiscal, Debi Primary Care Provider: Cathlean Cower Other Clinician: Referring Provider: Cathlean Cower Treating Provider/Extender: Melburn Hake, HOYT Weeks in Treatment: 24 Verbal / Phone Orders: Yes Clinician: Carolyne Fiscal, Debi Read Back and Verified: Yes Diagnosis Coding ICD-10 Coding Code Description [phone] Non-pressure chronic ulcer of right calf with fat layer exposed I89.0  Lymphedema, not elsewhere classified I73.9 Peripheral vascular disease, unspecified F17.218 Nicotine dependence, cigarettes, with other nicotine-induced disorders Wound Cleansing o Clean wound with Normal Saline. Skin Barriers/Peri-Wound Care o Triamcinolone Acetonide Ointment Medications-please add to medication list. o Other: - Use Cortisone Ointment daily for 1-2 weeks. Patient Medications  Allergies: NKDA Notifications Medication Indication Start End lidocaine DOSE 1 - topical 4 % cream - 1 cream topical Electronic Signature(s) Signed: 02/20/2017 12:52:48 PM By: Worthy Keeler PA-C Signed: 02/20/2017 4:17:47 PM By: Alric Quan Entered By: Alric Quan on 02/20/2017 11:17:37 Kimberly [name] ([id number]) -------------------------------------------------------------------------------- Prescription 02/20/2017 Patient Name: Kimberly [name] Provider: Worthy Keeler PA-C Date of Birth: 1945/04/15 NPI#: [id number] Sex: F DEA#: [id number] Phone #: [phone] License #: Patient Address: [facility] [place], [place] [address] [address], [place], [place] [address] [phone] Allergies NKDA Medication Medication: Route: Strength: Form: lidocaine topical 4% cream Class: TOPICAL LOCAL ANESTHETICS Dose: Frequency / Time: Indication: 1 1 cream topical Number of Refills: Number of Units: 0 Generic Substitution: Start Date: End Date: Administered at Substitution Permitted Facility: Yes Time Administered: Time Discontinued: Note to Pharmacy: Signature(s): Date(s): Electronic Signature(s) Signed: 02/20/2017 12:52:48 PM By: Worthy Keeler PA-C Signed: 02/20/2017 4:17:47 PM By: Alric Quan Entered By: Alric Quan on 02/20/2017 11:17:37 Kimberly [name] ([id number]) Redmond Pulling, Lonie Peak  ([id number]) --------------------------------------------------------------------------------  Problem List Details Patient Name: Kimberly [name] Date of Service: 02/20/2017 10:15 AM Medical Record Number: [id number] Patient Account Number: [id number] Date of Birth/Sex: 06/09/1945 (71 y.o. Female) Treating RN: Ahmed Prima Primary Care Provider: Cathlean Cower Other Clinician: Referring Provider: Cathlean Cower Treating Provider/Extender: Melburn Hake, HOYT Weeks in Treatment: 11 Active Problems ICD-10 Encounter Code Description Active Date Diagnosis L97.212 Non-pressure chronic ulcer of right calf with fat layer exposed 12/05/2016 Yes I89.0 Lymphedema, not elsewhere classified 12/05/2016 Yes I73.9 Peripheral vascular disease, unspecified 12/05/2016 Yes F17.218 Nicotine dependence, cigarettes, with other nicotine-induced 12/05/2016 Yes disorders Inactive Problems Resolved Problems Electronic Signature(s) Signed: 02/20/2017 12:52:48 PM By: Worthy Keeler PA-C Entered By: Worthy Keeler on 02/20/2017 11:11:15 Kimberly [name] ([id number]) -------------------------------------------------------------------------------- Progress Note Details Patient Name: Kimberly [name] Date of Service: 02/20/2017 10:15 AM Medical Record Number: [id number] Patient Account Number: [id number] Date of Birth/Sex: [birth year] (71 y.o. Female) Treating RN: Ahmed Prima Primary Care Provider: Cathlean Cower Other Clinician: Referring Provider: Cathlean Cower Treating Provider/Extender: Melburn Hake, HOYT Weeks in Treatment: 11 Subjective Chief Complaint Information obtained from Patient Patient seen for complaints of Non-Healing Wound to the right lateral calf History of Present Illness (HPI) 72 year old patient was seen by the nurse practitioner at the [facility] primary care for a wound which has been there on her right leg since August 2018. For a period of time she has been treated with doxycycline, clindamycin and  Bactrim and the wound continues to have drainage and odor.on 11/20/2016 she was again put on Bactrim DS and referred to the wound center. Past medical history significant for hypertension, congestive heart failure, COPD, dyslipidemia, migraines, pericarditis, status post VSD repair, cholecystectomy tubal ligation, cesarean section, coronary angiogram,my tenotomy with tube placement on the right,dilated cardiomyopathy,tobacco abuse and has been a smoker for the last 35 years. She also has a history of right great saphenous vein ablation but the patient does not confirm this for sure. She has had a venous duplex study done in August 2018 which showed no evidence of lower extremity deep or superficial venous thrombus or incompetence bilaterally. She has a arterial duplex study pending, which is going to be done later today. 12/22/16 on evaluation today patient appears to be doing fairly well in regard to her right lateral lower extremity wounds. She did undergo a right common femoral artery balloon angioplasty and stent placement which was  performed on 12/17/16. Patient this is did appear to be a sufficient improvement according to the reviewed notes which is excellent news. Prior to this procedure patient did have a study of the lower extremity this was a Doppler performed on 12/08/16 this revealed moderate right lower extremity arterial disease involving the common femoral artery and iliac segment. There was also left arterial obstruction involving the iliac segment. On the left there was arterial obstruction involving the iliac segment, common femoral artery, and peroneal artery. Patient states she is still having some soreness left over and residual from the procedure. However overall she appears to be doing very well. She does have a follow-up study with vascular in the next week. 01/02/2017 -- was seen by Dr. Jacqulyn Cane -- she had an abdominal aortogram with lower extremity peripheral  vascular intervention, and this showed moderate left common iliac artery stenosis. Short occlusion of the right common femoral artery with no significant infrainguinal disease. Short occlusion of the left common femoral artery with no significant infrainguinal disease and a successful drug-coated balloon angioplasty stent was placed in the right common femoral artery. She had a lower extremity arterial duplex examination done which showed the resting right ABI is within normal range and no evidence of significant right lower extremity arterial disease. right ABI is 1.08 with a toe pressure being 0.54. there was biphasic flow. The left ABI was 0.77 and the toe and this was 0.38 with monophasic flow.The right toe brachial indicis were abnormal. The left resting ABI indicates moderate left lower extremity arterial disease in the left toe brachial index is abnormal. 01/09/17 on evaluation today patient presents with continued ulcerations noted of the right lower extremity. This does seem to be doing a little bit better but still is slough covered. She has been tolerating the dressing changes fairly well but states that the debridement does tend to bother her pain wise. No fevers, chills, nausea, or vomiting noted at this time. She does tell me that the alginate dressing that she was looking to use following her last evaluation is expensive and insurance will not cover this it's actually cheaper for her to utilize the [place]. 02/06/17 on evaluation today patient's wound appears to be doing excellent at this point in time. In fact she has been tolerating the dressing changes very well there does not even appear to be any evidence or need for debridement today. She's not [place], [place] B. ([id number]) having any significant pain which is good news. No fevers, chills, nausea, or vomiting noted at this time. Overall she is extremely pleased with how things have progressed. 02/20/17 on evaluation today patient  appears to be doing well in regard to her right lower extremity ulcer. In fact this appears to be completely healed. She still has some dry pinpoint scab like areas noted over the lower extremity but fortunately nothing significant and none of these seem to have any wound underneath when they come off it is completely healed underneath. Nonetheless we are gonna let this work itself off not try to do anything to invasive. Patient History Information obtained from Patient. Family History Cancer - Father, Heart Disease - Siblings, Hypertension - Mother, Tuberculosis - Father, No family history of Diabetes, Hereditary Spherocytosis, Kidney Disease, Lung Disease, Seizures, Stroke, Thyroid Problems. Social History Current every day smoker - 2-3 day, Marital Status - Married, Alcohol Use - Never, Drug Use - No History, Caffeine Use - Daily. Review of Systems (ROS) Constitutional Symptoms (General Health) The patient has no  complaints or symptoms. Respiratory The patient has no complaints or symptoms. Cardiovascular The patient has no complaints or symptoms. Psychiatric The patient has no complaints or symptoms. Objective Constitutional Thin and well-hydrated in no acute distress. Vitals Time Taken: 10:59 AM, Height: 61 in, Weight: 111.9 lbs, BMI: 21.1, Temperature: 97.6 F, Pulse: 56 bpm, Respiratory Rate: 20 breaths/min, Blood Pressure: 98/48 mmHg. Respiratory normal breathing without difficulty. Psychiatric this patient is able to make decisions and demonstrates good insight into disease process. Alert and Oriented x 3. pleasant and cooperative. General Notes: Testing erythema secondary to just secondary to inflammation at this point in regard to the right lower extremity. Fortunately there does not appear to be any evidence of infection. This does appear to be completely healed. Integumentary (Hair, Skin) Kimberly [name], Kimberly B. ([id number]) Wound #1 status is Open. Original cause of wound was  Trauma. The wound is located on the Right,Lateral Lower Leg. The wound measures 0cm length x 0cm width x 0cm depth; 0cm^2 area and 0cm^3 volume. There is no tunneling or undermining noted. There is a none present amount of drainage noted. The wound margin is flat and intact. There is no granulation within the wound bed. There is no necrotic tissue within the wound bed. The periwound skin appearance exhibited: Dry/Scaly. The periwound skin appearance did not exhibit: Callus, Crepitus, Excoriation, Induration, Rash, Scarring, Maceration, Atrophie Blanche, Cyanosis, Ecchymosis, Hemosiderin Staining, Mottled, Pallor, Rubor, Erythema. Periwound temperature was noted as No Abnormality. The periwound has tenderness on palpation. Wound #2 status is Open. Original cause of wound was Trauma. The wound is located on the Right,Posterior Lower Leg. The wound measures 0cm length x 0cm width x 0cm depth; 0cm^2 area and 0cm^3 volume. There is no tunneling or undermining noted. There is a none present amount of drainage noted. The wound margin is distinct with the outline attached to the wound base. There is no granulation within the wound bed. There is no necrotic tissue within the wound bed. The periwound skin appearance did not exhibit: Callus, Crepitus, Excoriation, Induration, Rash, Scarring, Dry/Scaly, Maceration, Atrophie Blanche, Cyanosis, Ecchymosis, Hemosiderin Staining, Mottled, Pallor, Rubor, Erythema. Periwound temperature was noted as No Abnormality. The periwound has tenderness on palpation. Assessment Active Problems ICD-10 L97.212 - Non-pressure chronic ulcer of right calf with fat layer exposed I89.0 - Lymphedema, not elsewhere classified I73.9 - Peripheral vascular disease, unspecified F17.218 - Nicotine dependence, cigarettes, with other nicotine-induced disorders Plan Wound Cleansing: Clean wound with Normal Saline. Skin Barriers/Peri-Wound Care: Triamcinolone Acetonide  Ointment Medications-please add to medication list.: Other: - Use Cortisone Ointment daily for 1-2 weeks. The following medication(s) was prescribed: lidocaine topical 4 % cream 1 1 cream topical was prescribed at facility I am going to recommend that we discontinue the Current wound care measures. We are going to apply triamcinolone topically here in the office to help with some the inflammation and to loosen up the remaining small pinpoint eschar's. Patient will use this at home for a couple of days as well applying this first thing in the morning. Subsequently she will then transition to hydrocortisone ointment which she can pick up at the pharmacy rather cheaply and use that for two weeks. In the evening she will use the user in which I previously recommended for her. Hopefully this will keep the skin moisturizing help with inflammation over the next two weeks. It sounds that she is going to need a defibrillator and prior to proceeding with that surgery this has to be completely healed. For that reason I  am going to see her in two weeks time just to ensure that Kimberly [name], Kimberly B. ([id number]) everything still remains closed and there is no issue at that point. She will then be discharged at that point if everything does appear to be well. Please see above for specific wound care orders. We will see patient for re-evaluation in 2 week(s) here in the clinic. If anything worsens or changes patient will contact our office for additional recommendations. Electronic Signature(s) Signed: 02/20/2017 12:52:48 PM By: Worthy Keeler PA-C Entered By: Worthy Keeler on 02/20/2017 11:24:40 Kimberly [name] ([id number]) -------------------------------------------------------------------------------- ROS/PFSH Details Patient Name: Kimberly [name] Date of Service: 02/20/2017 10:15 AM Medical Record Number: [id number] Patient Account Number: [id number] Date of Birth/Sex: 10-20-1945 (71 y.o. Female) Treating RN:  Carolyne Fiscal, Debi Primary Care Provider: Cathlean Cower Other Clinician: Referring Provider: Cathlean Cower Treating Provider/Extender: Melburn Hake, HOYT Weeks in Treatment: 11 Information Obtained From Patient Wound History Do you currently have one or more open woundso Yes How many open wounds do you currently haveo 1 Approximately how long have you had your woundso aug 2018 How have you been treating your wound(s) until nowo hydrogel ag Has your wound(s) ever healed and then re-openedo No Have you had any lab work done in the past montho No Have you tested positive for an antibiotic resistant organism (MRSA, VRE)o No Have you tested positive for osteomyelitis (bone infection)o No Have you had any tests for circulation on your legso No Have you had other problems associated with your woundso Swelling Constitutional Symptoms (General Health) Complaints and Symptoms: No Complaints or Symptoms Hematologic/Lymphatic Medical History: Positive for: Anemia - hx Respiratory Complaints and Symptoms: No Complaints or Symptoms Medical History: Positive for: Asthma; Chronic Obstructive Pulmonary Disease (COPD) Cardiovascular Complaints and Symptoms: No Complaints or Symptoms Medical History: Positive for: Congestive Heart Failure Psychiatric Complaints and Symptoms: No Complaints or Symptoms Immunizations Pneumococcal Vaccine: Received Pneumococcal Vaccination: Yes Kimberly [name], Kimberly [name] ([id number]) Implantable Devices Family and Social History Cancer: Yes - Father; Diabetes: No; Heart Disease: Yes - Siblings; Hereditary Spherocytosis: No; Hypertension: Yes - Mother; Kidney Disease: No; Lung Disease: No; Seizures: No; Stroke: No; Thyroid Problems: No; Tuberculosis: Yes - Father; Current every day smoker - 2-3 day; Marital Status - Married; Alcohol Use: Never; Drug Use: No History; Caffeine Use: Daily; Financial Concerns: No; Food, Clothing or Shelter Needs: No; Support System Lacking: No;  Transportation Concerns: No; Advanced Directives: No; Patient does not want information on Advanced Directives; Do not resuscitate: No; Living Will: No; Medical Power of Attorney: No Physician Affirmation I have reviewed and agree with the above information. Electronic Signature(s) Signed: 02/20/2017 12:52:48 PM By: Worthy Keeler PA-C Signed: 02/20/2017 4:17:47 PM By: Alric Quan Entered By: Worthy Keeler on 02/20/2017 11:22:31 Kimberly [name] ([id number]) -------------------------------------------------------------------------------- SuperBill Details Patient Name: Kimberly [name] Date of Service: 02/20/2017 Medical Record Number: [id number] Patient Account Number: [id number] Date of Birth/Sex: 1946/01/04 (71 y.o. Female) Treating RN: Ahmed Prima Primary Care Provider: Cathlean Cower Other Clinician: Referring Provider: Cathlean Cower Treating Provider/Extender: Melburn Hake, HOYT Weeks in Treatment: 11 Diagnosis Coding ICD-10 Codes Code Description [phone] Non-pressure chronic ulcer of right calf with fat layer exposed I89.0 Lymphedema, not elsewhere classified I73.9 Peripheral vascular disease, unspecified F17.218 Nicotine dependence, cigarettes, with other nicotine-induced disorders Facility Procedures CPT4 Code: 16606301 Description: 60109 - WOUND CARE VISIT-LEV 2 EST PT Modifier: Quantity: 1 Physician Procedures CPT4 Code: 3235573 Description: 22025 - WC PHYS LEVEL 3 - EST PT ICD-10 Diagnosis Description L97.212  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

## 2017-02-22 NOTE — Progress Notes (Signed)
Kimberly, [name] ([id number]) Visit Report for 02/20/2017 Arrival Information Details Patient Name: Kimberly [name], Kimberly [name] Date of Service: 02/20/2017 10:15 AM Medical Record Number: [id number] Patient Account Number: [id number] Date of Birth/Sex: 11/22/1945 (72 y.o. Female) Treating RN: Ahmed Prima Primary Care Daquarius Dubeau: Cathlean Cower Other Clinician: Referring Zaccheus Edmister: Cathlean Cower Treating Aanshi Batchelder/Extender: Melburn Hake, HOYT Weeks in Treatment: 11 Visit Information History Since Last Visit All ordered tests and consults were completed: No Patient Arrived: Ambulatory Added or deleted any medications: No Arrival Time: 10:58 Any new allergies or adverse reactions: No Accompanied By: husband Had a fall or experienced change in No Transfer Assistance: None activities of daily living that may affect Patient Identification Verified: Yes risk of falls: Secondary Verification Process Yes Signs or symptoms of abuse/neglect since last visito No Completed: Hospitalized since last visit: No Patient Requires Transmission-Based No Has Dressing in Place as Prescribed: Yes Precautions: Pain Present Now: No Patient Has Alerts: Yes Patient Alerts: R ABI 0.62 from EPIC L ABI 0.72 from [facility] Electronic Signature(s) Signed: 02/20/2017 4:17:47 PM By: Alric Quan Entered By: Alric Quan on 02/20/2017 10:58:59 Kimberly [name] ([id number]) -------------------------------------------------------------------------------- Clinic Level of Care Assessment Details Patient Name: Kimberly [name] Date of Service: 02/20/2017 10:15 AM Medical Record Number: [id number] Patient Account Number: [id number] Date of Birth/Sex: 1945/04/12 (72 y.o. Female) Treating RN: Carolyne Fiscal, Debi Primary Care Mariaisabel Bodiford: Cathlean Cower Other Clinician: Referring Ballard Budney: Cathlean Cower Treating Mouhamed Glassco/Extender: Melburn Hake, HOYT Weeks in Treatment: 11 Clinic Level of Care Assessment Items TOOL 4 Quantity Score X - Use when only an EandM  is performed on FOLLOW-UP visit 1 0 ASSESSMENTS - Nursing Assessment / Reassessment X - Reassessment of Co-morbidities (includes updates in patient status) 1 10 X- 1 5 Reassessment of Adherence to Treatment Plan ASSESSMENTS - Wound and Skin Assessment / Reassessment []  - Simple Wound Assessment / Reassessment - one wound 0 X- 2 5 Complex Wound Assessment / Reassessment - multiple wounds []  - 0 Dermatologic / Skin Assessment (not related to wound area) ASSESSMENTS - Focused Assessment []  - Circumferential Edema Measurements - multi extremities 0 []  - 0 Nutritional Assessment / Counseling / Intervention []  - 0 Lower Extremity Assessment (monofilament, tuning fork, pulses) []  - 0 Peripheral Arterial Disease Assessment (using hand held doppler) ASSESSMENTS - Ostomy and/or Continence Assessment and Care []  - Incontinence Assessment and Management 0 []  - 0 Ostomy Care Assessment and Management (repouching, etc.) PROCESS - Coordination of Care X - Simple Patient / Family Education for ongoing care 1 15 []  - 0 Complex (extensive) Patient / Family Education for ongoing care []  - 0 Staff obtains Programmer, systems, Records, Test Results / Process Orders []  - 0 Staff telephones HHA, Nursing Homes / Clarify orders / etc []  - 0 Routine Transfer to another Facility (non-emergent condition) []  - 0 Routine Hospital Admission (non-emergent condition) []  - 0 New Admissions / Biomedical engineer / Ordering NPWT, Apligraf, etc. []  - 0 Emergency Hospital Admission (emergent condition) X- 1 10 Simple Discharge Coordination MARCEY, PERSAD B. ([id number]) []  - 0 Complex (extensive) Discharge Coordination PROCESS - Special Needs []  - Pediatric / Minor Patient Management 0 []  - 0 Isolation Patient Management []  - 0 Hearing / Language / Visual special needs []  - 0 Assessment of Community assistance (transportation, D/C planning, etc.) []  - 0 Additional assistance / Altered mentation []  -  0 Support Surface(s) Assessment (bed, cushion, seat, etc.) INTERVENTIONS - Wound Cleansing / Measurement []  - Simple Wound Cleansing - one wound 0 X- 2 5 Complex Wound  Cleansing - multiple wounds X- 1 5 Wound Imaging (photographs - any number of wounds) []  - 0 Wound Tracing (instead of photographs) []  - 0 Simple Wound Measurement - one wound []  - 0 Complex Wound Measurement - multiple wounds INTERVENTIONS - Wound Dressings []  - Small Wound Dressing one or multiple wounds 0 []  - 0 Medium Wound Dressing one or multiple wounds []  - 0 Large Wound Dressing one or multiple wounds []  - 0 Application of Medications - topical []  - 0 Application of Medications - injection INTERVENTIONS - Miscellaneous []  - External ear exam 0 []  - 0 Specimen Collection (cultures, biopsies, blood, body fluids, etc.) []  - 0 Specimen(s) / Culture(s) sent or taken to Lab for analysis []  - 0 Patient Transfer (multiple staff / Civil Service fast streamer / Similar devices) []  - 0 Simple Staple / Suture removal (25 or less) []  - 0 Complex Staple / Suture removal (26 or more) []  - 0 Hypo / Hyperglycemic Management (close monitor of Blood Glucose) []  - 0 Ankle / Brachial Index (ABI) - do not check if billed separately X- 1 5 Vital Signs ONEAL, BIGLOW B. ([id number]) Has the patient been seen at the hospital within the last three years: Yes Total Score: 70 Level Of Care: New/Established - Level 2 Electronic Signature(s) Signed: 02/20/2017 4:17:47 PM By: Alric Quan Entered By: Alric Quan on 02/20/2017 [name], [name]. ([id number]) -------------------------------------------------------------------------------- Encounter Discharge Information Details Patient Name: Kimberly [name] Date of Service: 02/20/2017 10:15 AM Medical Record Number: [id number] Patient Account Number: [id number] Date of Birth/Sex: [birth year] (72 y.o. Female) Treating RN: Ahmed Prima Primary Care Sharlette Jansma: Cathlean Cower Other  Clinician: Referring Satish Hammers: Cathlean Cower Treating Makye Radle/Extender: Melburn Hake, HOYT Weeks in Treatment: 65 Encounter Discharge Information Items Discharge Pain Level: 0 Discharge Condition: Stable Ambulatory Status: Ambulatory Discharge Destination: Home Transportation: Private Auto Accompanied By: husband Schedule Follow-up Appointment: Yes Medication Reconciliation completed and No provided to Patient/Care Exzavier Ruderman: Provided on Clinical Summary of Care: 02/20/2017 Form Type Recipient Paper Patient MW Electronic Signature(s) Signed: 02/20/2017 4:27:15 PM By: Ruthine Dose Entered By: Ruthine Dose on 02/20/2017 11:27:56 Kimberly [name] ([id number]) -------------------------------------------------------------------------------- Lower Extremity Assessment Details Patient Name: Kimberly [name] Date of Service: 02/20/2017 10:15 AM Medical Record Number: [id number] Patient Account Number: [id number] Date of Birth/Sex: 1946/01/08 (72 y.o. Female) Treating RN: Ahmed Prima Primary Care Denia Mcvicar: Cathlean Cower Other Clinician: Referring Penne Rosenstock: Cathlean Cower Treating Lalla Laham/Extender: Melburn Hake, HOYT Weeks in Treatment: 11 Vascular Assessment Pulses: Dorsalis Pedis Palpable: [Right:Yes] Posterior Tibial Extremity colors, hair growth, and conditions: Extremity Color: [Right:Hyperpigmented] Temperature of Extremity: [Right:Warm] Capillary Refill: [Right:< 3 seconds] Toe Nail Assessment Left: Right: Thick: No Discolored: No Deformed: Yes Electronic Signature(s) Signed: 02/20/2017 4:17:47 PM By: Alric Quan Entered By: Alric Quan on 02/20/2017 11:14:58 Kimberly [name] ([id number]) -------------------------------------------------------------------------------- Multi Wound Chart Details Patient Name: Kimberly [name] Date of Service: 02/20/2017 10:15 AM Medical Record Number: [id number] Patient Account Number: [id number] Date of Birth/Sex: Nov 01, 1945 (71 y.o.  Female) Treating RN: Ahmed Prima Primary Care Ercia Crisafulli: Cathlean Cower Other Clinician: Referring Cletis Clack: Cathlean Cower Treating Dontavia Brand/Extender: Melburn Hake, HOYT Weeks in Treatment: 11 Vital Signs Height(in): 61 Pulse(bpm): 56 Weight(lbs): 111.9 Blood Pressure(mmHg): 98/48 Body Mass Index(BMI): 21 Temperature(F): 97.6 Respiratory Rate 20 (breaths/min): Wound Assessments Treatment Notes Electronic Signature(s) Signed: 02/20/2017 4:17:47 PM By: Alric Quan Entered By: Alric Quan on 02/20/2017 11:13:19 Kimberly [name] ([id number]) -------------------------------------------------------------------------------- Multi-Disciplinary Care Plan Details Patient Name: Kimberly [name] Date of Service: 02/20/2017 10:15 AM Medical Record Number: [id number] Patient Account  Number: [id number] Date of Birth/Sex: 1945/02/09 (72 y.o. Female) Treating RN: Ahmed Prima Primary Care Bronnie Vasseur: Cathlean Cower Other Clinician: Referring Basma Buchner: Cathlean Cower Treating Latisia Hilaire/Extender: Melburn Hake, HOYT Weeks in Treatment: 11 Active Inactive ` Nutrition Nursing Diagnoses: Imbalanced nutrition Potential for alteratiion in Nutrition/Potential for imbalanced nutrition Goals: Patient/caregiver agrees to and verbalizes understanding of need to use nutritional supplements and/or vitamins as prescribed Date Initiated: 12/05/2016 Target Resolution Date: 02/28/2017 Goal Status: Active Interventions: Assess patient nutrition upon admission and as needed per policy Notes: ` Orientation to the Wound Care Program Nursing Diagnoses: Knowledge deficit related to the wound healing center program Goals: Patient/caregiver will verbalize understanding of the [place] Program Date Initiated: 12/05/2016 Target Resolution Date: 12/27/2016 Goal Status: Active Interventions: Provide education on orientation to the wound center Notes: ` Pain, Acute or Chronic Nursing Diagnoses: Pain, acute  or chronic: actual or potential Potential alteration in comfort, pain Goals: Patient/caregiver will verbalize adequate pain control between visits Date Initiated: 12/05/2016 Target Resolution Date: 03/28/2017 Goal Status: Active SHEKINAH, PITONES ([id number]) Interventions: Complete pain assessment as per visit requirements Notes: ` Wound/Skin Impairment Nursing Diagnoses: Impaired tissue integrity Knowledge deficit related to smoking impact on wound healing Knowledge deficit related to ulceration/compromised skin integrity Goals: Ulcer/skin breakdown will have a volume reduction of 80% by week 12 Date Initiated: 12/05/2016 Target Resolution Date: 03/28/2017 Goal Status: Active Interventions: Assess patient/caregiver ability to perform ulcer/skin care regimen upon admission and as needed Assess ulceration(s) every visit Provide education on smoking Notes: Electronic Signature(s) Signed: 02/20/2017 4:17:47 PM By: Alric Quan Entered By: Alric Quan on 02/20/2017 11:15:05 Kimberly [name] ([id number]) -------------------------------------------------------------------------------- Pain Assessment Details Patient Name: Kimberly [name] Date of Service: 02/20/2017 10:15 AM Medical Record Number: [id number] Patient Account Number: [id number] Date of Birth/Sex: Nov 15, 1945 (72 y.o. Female) Treating RN: Ahmed Prima Primary Care Kipling Graser: Cathlean Cower Other Clinician: Referring Jennene Downie: Cathlean Cower Treating Azie Mcconahy/Extender: Melburn Hake, HOYT Weeks in Treatment: 11 Active Problems Location of Pain Severity and Description of Pain Patient Has Paino No Site Locations Pain Management and Medication Current Pain Management: Electronic Signature(s) Signed: 02/20/2017 4:17:47 PM By: Alric Quan Entered By: Alric Quan on 02/20/2017 10:59:12 Kimberly [name] ([id number]) -------------------------------------------------------------------------------- Patient/Caregiver Education  Details Patient Name: Kimberly [name] Date of Service: 02/20/2017 10:15 AM Medical Record Number: [id number] Patient Account Number: [id number] Date of Birth/Gender: Sep 23, 1945 (72 y.o. Female) Treating RN: Ahmed Prima Primary Care Physician: Cathlean Cower Other Clinician: Referring Physician: Cathlean Cower Treating Physician/Extender: Sharalyn Ink in Treatment: 11 Education Assessment Education Provided To: Patient Education Topics Provided Wound/Skin Impairment: Handouts: Other: follow instructions given Methods: Demonstration, Explain/Verbal Responses: State content correctly Electronic Signature(s) Signed: 02/20/2017 4:17:47 PM By: Alric Quan Entered By: Alric Quan on 02/20/2017 11:18:32 Kimberly [name] ([id number]) -------------------------------------------------------------------------------- Wound Assessment Details Patient Name: Kimberly [name] Date of Service: 02/20/2017 10:15 AM Medical Record Number: [id number] Patient Account Number: [id number] Date of Birth/Sex: [birth year] (72 y.o. Female) Treating RN: Ahmed Prima Primary Care Vannak Montenegro: Cathlean Cower Other Clinician: Referring Tongela Encinas: Cathlean Cower Treating Jemarion Roycroft/Extender: Melburn Hake, HOYT Weeks in Treatment: 11 Wound Status Wound Number: 1 Primary Trauma, Other Etiology: Wound Location: Right Lower Leg - Lateral Wound Open Wounding Event: Trauma Status: Date Acquired: 09/03/2016 Comorbid Anemia, Asthma, Chronic Obstructive Weeks Of Treatment: 11 History: Pulmonary Disease (COPD), Congestive Heart Clustered Wound: No Failure Photos Photo Uploaded By: Alric Quan on 02/20/2017 13:46:40 Wound Measurements Length: (cm) 0 Width: (cm) 0 Depth: (cm) 0 Area: (cm) 0 Volume: (cm) 0 % Reduction  in Area: 100% % Reduction in Volume: 100% Epithelialization: Large (67-100%) Tunneling: No Undermining: No Wound Description Classification: Partial Thickness Wound Margin: Flat and  Intact Exudate Amount: None Present Foul Odor After Cleansing: No Slough/Fibrino No Wound Bed Granulation Amount: None Present (0%) Exposed Structure Necrotic Amount: None Present (0%) Fascia Exposed: No Fat Layer (Subcutaneous Tissue) Exposed: No Tendon Exposed: No Muscle Exposed: No Joint Exposed: No Bone Exposed: No Periwound Skin Texture Texture Color No Abnormalities Noted: No No Abnormalities Noted: No SHAKELA, DONATI ([id number]) Callus: No Atrophie Blanche: No Crepitus: No Cyanosis: No Excoriation: No Ecchymosis: No Induration: No Erythema: No Rash: No Hemosiderin Staining: No Scarring: No Mottled: No Pallor: No Moisture Rubor: No No Abnormalities Noted: No Dry / Scaly: Yes Temperature / Pain Maceration: No Temperature: No Abnormality Tenderness on Palpation: Yes Wound Preparation Ulcer Cleansing: Rinsed/Irrigated with Saline Topical Anesthetic Applied: None Electronic Signature(s) Signed: 02/20/2017 4:17:47 PM By: Alric Quan Entered By: Alric Quan on 02/20/2017 [place], [place] ([id number]) -------------------------------------------------------------------------------- Wound Assessment Details Patient Name: Kimberly [name] Date of Service: 02/20/2017 10:15 AM Medical Record Number: [id number] Patient Account Number: [id number] Date of Birth/Sex: 1945/06/22 (72 y.o. Female) Treating RN: Carolyne Fiscal, Debi Primary Care Laryssa Hassing: Cathlean Cower Other Clinician: Referring Kaleb Sek: Cathlean Cower Treating Blen Ransome/Extender: Melburn Hake, HOYT Weeks in Treatment: 11 Wound Status Wound Number: 2 Primary Trauma, Other Etiology: Wound Location: Right Lower Leg - Posterior Wound Open Wounding Event: Trauma Status: Date Acquired: 09/03/2016 Comorbid Anemia, Asthma, Chronic Obstructive Weeks Of Treatment: 11 History: Pulmonary Disease (COPD), Congestive Heart Clustered Wound: No Failure Photos Photo Uploaded By: Alric Quan on 02/20/2017  13:46:54 Wound Measurements Length: (cm) 0 % Re Width: (cm) 0 % Re Depth: (cm) 0 Epit Area: (cm) 0 Tun Volume: (cm) 0 Und duction in Area: 100% duction in Volume: 100% helialization: Large (67-100%) neling: No ermining: No Wound Description Classification: Partial Thickness Wound Margin: Distinct, outline attached Exudate Amount: None Present Foul Odor After Cleansing: No Slough/Fibrino No Wound Bed Granulation Amount: None Present (0%) Necrotic Amount: None Present (0%) Periwound Skin Texture Texture Color No Abnormalities Noted: No No Abnormalities Noted: No Callus: No Atrophie Blanche: No Crepitus: No Cyanosis: No Excoriation: No Ecchymosis: No Induration: No Erythema: No Rash: No Hemosiderin Staining: No MEGHANN, L[place] ([id number]) Scarring: No Mottled: No Pallor: No Moisture Rubor: No No Abnormalities Noted: No Dry / Scaly: No Temperature / Pain Maceration: No Temperature: No Abnormality Tenderness on Palpation: Yes Wound Preparation Ulcer Cleansing: Rinsed/Irrigated with Saline Topical Anesthetic Applied: None Electronic Signature(s) Signed: 02/20/2017 4:17:47 PM By: Alric Quan Entered By: Alric Quan on 02/20/2017 11:14:39 Kimberly [name] ([id number]) -------------------------------------------------------------------------------- Vitals Details Patient Name: Kimberly [name] Date of Service: 02/20/2017 10:15 AM Medical Record Number: [id number] Patient Account Number: [id number] Date of Birth/Sex: [birth year] (72 y.o. Female) Treating RN: Carolyne Fiscal, Debi Primary Care Keyonta Madrid: Cathlean Cower Other Clinician: Referring Jacarra Bobak: Cathlean Cower Treating Kyley Laurel/Extender: Melburn Hake, HOYT Weeks in Treatment: 11 Vital Signs Time Taken: 10:59 Temperature (F): 97.6 Height (in): 61 Pulse (bpm): 56 Weight (lbs): 111.9 Respiratory Rate (breaths/min): 20 Body Mass Index (BMI): 21.1 Blood Pressure (mmHg): 98/48 Reference Range: 80 - 120 mg /  dl Electronic Signature(s) Signed: 02/20/2017 4:17:47 PM By: Alric Quan Entered By: Alric Quan on 02/20/2017 11:00:55

## 2017-03-02 ENCOUNTER — Telehealth: Payer: Self-pay | Admitting: Internal Medicine

## 2017-03-02 MED ORDER — HYDROCOD POLST-CPM POLST ER 10-8 MG/5ML PO SUER: 5.0000 mL | Freq: Two times a day (BID) | ORAL | 0 refills | Status: DC | PRN

## 2017-03-02 NOTE — Telephone Encounter (Signed)
Copied from [place]. Topic: Quick Communication - Rx Refill/Question >> Mar 02, 2017  9:05 AM Scherrie Gerlach wrote: Medication: chlorpheniramine-HYDROcodone Amanda Cockayne [facility] ER) 10-8 MG/5ML SUER  Pt states Dr Jenny Reichmann prescribes for her for an ongoing cough she has.    [facility] [address] [place], [place] - [place] [phone] (Phone) [phone] (Fax)

## 2017-03-02 NOTE — Telephone Encounter (Signed)
Done erx 

## 2017-03-05 ENCOUNTER — Telehealth: Payer: Self-pay | Admitting: Cardiovascular Disease

## 2017-03-05 ENCOUNTER — Other Ambulatory Visit: Payer: Self-pay | Admitting: *Deleted

## 2017-03-05 MED ORDER — CARVEDILOL 3.125 MG PO TABS: 3.1250 mg | ORAL_TABLET | Freq: Two times a day (BID) | ORAL | 1 refills | Status: DC

## 2017-03-05 NOTE — Telephone Encounter (Signed)
°*  STAT* If patient is at the pharmacy, call can be transferred to refill team.   1. Which medications need to be refilled? (please list name of each medication and dose if known)  Coreg   2. Which pharmacy/location (including street and city if local pharmacy) is medication to be sent to?  [facility]   3. Do they need a 30 day or 90 day supply? 90 day

## 2017-03-05 NOTE — Telephone Encounter (Signed)
Requested Prescriptions   Signed Prescriptions Disp Refills  . carvedilol (COREG) 3.125 MG tablet 180 tablet 1    Sig: Take 1 tablet (3.125 mg total) by mouth 2 (two) times daily.    Authorizing Provider: Kathlyn [place] A    Ordering User: Britt Bottom

## 2017-03-06 ENCOUNTER — Encounter: Payer: Medicare Other | Admitting: Physician Assistant

## 2017-03-06 ENCOUNTER — Telehealth: Payer: Self-pay | Admitting: Cardiovascular Disease

## 2017-03-06 DIAGNOSIS — E785 Hyperlipidemia, unspecified: Secondary | ICD-10-CM | POA: Diagnosis not present

## 2017-03-06 DIAGNOSIS — I509 Heart failure, unspecified: Secondary | ICD-10-CM | POA: Diagnosis not present

## 2017-03-06 DIAGNOSIS — I890 Lymphedema, not elsewhere classified: Secondary | ICD-10-CM | POA: Diagnosis not present

## 2017-03-06 DIAGNOSIS — J449 Chronic obstructive pulmonary disease, unspecified: Secondary | ICD-10-CM | POA: Diagnosis not present

## 2017-03-06 DIAGNOSIS — L97212 Non-pressure chronic ulcer of right calf with fat layer exposed: Secondary | ICD-10-CM | POA: Diagnosis not present

## 2017-03-06 DIAGNOSIS — I739 Peripheral vascular disease, unspecified: Secondary | ICD-10-CM | POA: Diagnosis not present

## 2017-03-06 DIAGNOSIS — I110 Hypertensive heart disease with heart failure: Secondary | ICD-10-CM | POA: Diagnosis not present

## 2017-03-06 DIAGNOSIS — I420 Dilated cardiomyopathy: Secondary | ICD-10-CM | POA: Diagnosis not present

## 2017-03-06 DIAGNOSIS — L97219 Non-pressure chronic ulcer of right calf with unspecified severity: Secondary | ICD-10-CM | POA: Diagnosis not present

## 2017-03-06 NOTE — Telephone Encounter (Signed)
Pt c/o BP issue: STAT if pt c/o blurred vision, one-sided weakness or slurred speech  1. What are your last 5 BP readings?  This morning-103/140 She cannot remember, but they have all been low   2. Are you having any other symptoms (ex. Dizziness, headache, blurred vision, passed out)? Dizziness, if she bends over, when she gets back up, she feels like she is going to pass out  3. What is your BP issue? Too low

## 2017-03-06 NOTE — Telephone Encounter (Signed)
Patient reports dizziness that started 1 month ago after starting entresto, that has gradually gotten worse. Patient is driving while on the phone with nurse. Patient said she changes positions slowly. Patient said her BP at the wound center this morning was 103/40 patient is unsure of her heart rate. Medications reconciled while on phone and patient stated that she is not taking her potassium everyday. Patient advised that she needed to take the potassium everyday with the lasix. Patient hasn't taken tussionex in over one month but does take the alprazolam at bedtime for rest. Patient advised to monitor her blood pressure everyday using the same cuff, same arm and an hour after her meds have been taken. Patient advised that this message would be sent to her provider for further advise. Patient verbalized understanding.

## 2017-03-06 NOTE — Telephone Encounter (Addendum)
Patient informed. Patient advised to take her potassium with her lasix.

## 2017-03-06 NOTE — Telephone Encounter (Signed)
Decrease Lasix to every other day and take spironolactone in the evening rather than the am.

## 2017-03-09 NOTE — Progress Notes (Signed)
JYLL, TOMARO ([id number]) Visit Report for 03/06/2017 Chief Complaint Document Details Patient Name: Kimberly [name], Kimberly [name] Date of Service: 03/06/2017 10:15 AM Medical Record Number: [id number] Patient Account Number: [id number] Date of Birth/Sex: [birth year] (72 y.o. Female) Treating RN: Roger Shelter Primary Care Provider: Cathlean Cower Other Clinician: Referring Provider: Cathlean Cower Treating Provider/Extender: Melburn Hake,  Weeks in Treatment: 13 Information Obtained from: Patient Chief Complaint Patient seen for complaints of Non-Healing Wound to the right lateral calf Electronic Signature(s) Signed: 03/09/2017 8:04:39 AM By: Worthy Keeler PA-C Entered By: Worthy Keeler on 03/06/2017 10:26:26 Kimberly [name] ([id number]) -------------------------------------------------------------------------------- HPI Details Patient Name: Kimberly [name] Date of Service: 03/06/2017 10:15 AM Medical Record Number: [id number] Patient Account Number: [id number] Date of Birth/Sex: Sep 07, 1945 (72 y.o. Female) Treating RN: Roger Shelter Primary Care Provider: Cathlean Cower Other Clinician: Referring Provider: Cathlean Cower Treating Provider/Extender: Melburn Hake,  Weeks in Treatment: 7 History of Present Illness HPI Description: 72 year old patient was seen by the nurse practitioner at the [facility] primary care for a wound which has been there on her right leg since August 2018. For a period of time she has been treated with doxycycline, clindamycin and Bactrim and the wound continues to have drainage and odor.on 11/20/2016 she was again put on Bactrim DS and referred to the wound center. Past medical history significant for hypertension, congestive heart failure, COPD, dyslipidemia, migraines, pericarditis, status post VSD repair, cholecystectomy tubal ligation, cesarean section, coronary angiogram,my tenotomy with tube placement on the right,dilated cardiomyopathy,tobacco abuse and has been a smoker  for the last 35 years. She also has a history of right great saphenous vein ablation but the patient does not confirm this for sure. She has had a venous duplex study done in August 2018 which showed no evidence of lower extremity deep or superficial venous thrombus or incompetence bilaterally. She has a arterial duplex study pending, which is going to be done later today. 12/22/16 on evaluation today patient appears to be doing fairly well in regard to her right lateral lower extremity wounds. She did undergo a right common femoral artery balloon angioplasty and stent placement which was performed on 12/17/16. Patient this is did appear to be a sufficient improvement according to the reviewed notes which is excellent news. Prior to this procedure patient did have a study of the lower extremity this was a Doppler performed on 12/08/16 this revealed moderate right lower extremity arterial disease involving the common femoral artery and iliac segment. There was also left arterial obstruction involving the iliac segment. On the left there was arterial obstruction involving the iliac segment, common femoral artery, and peroneal artery. Patient states she is still having some soreness left over and residual from the procedure. However overall she appears to be doing very well. She does have a follow-up study with vascular in the next week. 01/02/2017 -- was seen by Dr. Jacqulyn Cane -- she had an abdominal aortogram with lower extremity peripheral vascular intervention, and this showed moderate left common iliac artery stenosis. Short occlusion of the right common femoral artery with no significant infrainguinal disease. Short occlusion of the left common femoral artery with no significant infrainguinal disease and a successful drug-coated balloon angioplasty stent was placed in the right common femoral artery. She had a lower extremity arterial duplex examination done which showed the resting right ABI  is within normal range and no evidence of significant right lower extremity arterial disease. right ABI is 1.08 with a toe pressure being 0.54. there was biphasic  flow. The left ABI was 0.77 and the toe and this was 0.38 with monophasic flow.The right toe brachial indicis were abnormal. The left resting ABI indicates moderate left lower extremity arterial disease in the left toe brachial index is abnormal. 01/09/17 on evaluation today patient presents with continued ulcerations noted of the right lower extremity. This does seem to be doing a little bit better but still is slough covered. She has been tolerating the dressing changes fairly well but states that the debridement does tend to bother her pain wise. No fevers, chills, nausea, or vomiting noted at this time. She does tell me that the alginate dressing that she was looking to use following her last evaluation is expensive and insurance will not cover this it's actually cheaper for her to utilize the Thor. 02/06/17 on evaluation today patient's wound appears to be doing excellent at this point in time. In fact she has been tolerating the dressing changes very well there does not even appear to be any evidence or need for debridement today. She's not having any significant pain which is good news. No fevers, chills, nausea, or vomiting noted at this time. Overall she is extremely pleased with how things have progressed. 02/20/17 on evaluation today patient appears to be doing well in regard to her right lower extremity ulcer. In fact this appears to be completely healed. She still has some dry pinpoint scab like areas noted over the lower extremity but fortunately nothing significant and none of these seem to have any wound underneath when they come off it is completely healed underneath. [place], [place] B. ([id number]) Nonetheless we are gonna let this work itself off not try to do anything to invasive. 03/06/17 on evaluation today patient  appears to be doing extremely well in regard to her right lateral lower extremity ulcer site. She appears to be completely healed and there is no evidence of residual ulceration at this time which is great news. Electronic Signature(s) Signed: 03/09/2017 8:04:39 AM By: Worthy Keeler PA-C Entered By: Worthy Keeler on 03/06/2017 17:14:51 Kimberly [name] ([id number]) -------------------------------------------------------------------------------- Physical Exam Details Patient Name: Kimberly [name] Date of Service: 03/06/2017 10:15 AM Medical Record Number: [id number] Patient Account Number: [id number] Date of Birth/Sex: 02-08-1945 (72 y.o. Female) Treating RN: Roger Shelter Primary Care Provider: Cathlean Cower Other Clinician: Referring Provider: Cathlean Cower Treating Provider/Extender: Melburn Hake,  Weeks in Treatment: 24 Constitutional Well-nourished and well-hydrated in no acute distress. Respiratory normal breathing without difficulty. Psychiatric this patient is able to make decisions and demonstrates good insight into disease process. Alert and Oriented x 3. pleasant and cooperative. Electronic Signature(s) Signed: 03/09/2017 8:04:39 AM By: Worthy Keeler PA-C Entered By: Worthy Keeler on 03/06/2017 17:15:21 Kimberly [name] ([id number]) -------------------------------------------------------------------------------- Physician Orders Details Patient Name: Kimberly [name] Date of Service: 03/06/2017 10:15 AM Medical Record Number: [id number] Patient Account Number: [id number] Date of Birth/Sex: [birth year] (72 y.o. Female) Treating RN: Roger Shelter Primary Care Provider: Cathlean Cower Other Clinician: Referring Provider: Cathlean Cower Treating Provider/Extender: Melburn Hake,  Weeks in Treatment: 57 Verbal / Phone Orders: No Diagnosis Coding ICD-10 Coding Code Description [phone] Non-pressure chronic ulcer of right calf with fat layer exposed I89.0 Lymphedema, not elsewhere  classified I73.9 Peripheral vascular disease, unspecified F17.218 Nicotine dependence, cigarettes, with other nicotine-induced disorders Discharge From [facility] o Discharge from [place] - treatment completed Electronic Signature(s) Signed: 03/06/2017 1:58:36 PM By: Roger Shelter Signed: 03/09/2017 8:04:39 AM By: Worthy Keeler PA-C Entered By: Roger Shelter  on 03/06/2017 11:12:42 Kimberly [name], Kimberly [name] ([id number]) -------------------------------------------------------------------------------- Problem List Details Patient Name: Kimberly [name], Kimberly [name] Date of Service: 03/06/2017 10:15 AM Medical Record Number: [id number] Patient Account Number: [id number] Date of Birth/Sex: December 08, 1945 (72 y.o. Female) Treating RN: Roger Shelter Primary Care Provider: Cathlean Cower Other Clinician: Referring Provider: Cathlean Cower Treating Provider/Extender: Melburn Hake,  Weeks in Treatment: 13 Active Problems ICD-10 Encounter Code Description Active Date Diagnosis L97.212 Non-pressure chronic ulcer of right calf with fat layer exposed 12/05/2016 Yes I89.0 Lymphedema, not elsewhere classified 12/05/2016 Yes I73.9 Peripheral vascular disease, unspecified 12/05/2016 Yes F17.218 Nicotine dependence, cigarettes, with other nicotine-induced 12/05/2016 Yes disorders Inactive Problems Resolved Problems Electronic Signature(s) Signed: 03/09/2017 8:04:39 AM By: Worthy Keeler PA-C Entered By: Worthy Keeler on 03/06/2017 10:26:19 Kimberly [name] ([id number]) -------------------------------------------------------------------------------- Progress Note Details Patient Name: Kimberly [name] Date of Service: 03/06/2017 10:15 AM Medical Record Number: [id number] Patient Account Number: [id number] Date of Birth/Sex: Oct 30, 1945 (72 y.o. Female) Treating RN: Roger Shelter Primary Care Provider: Cathlean Cower Other Clinician: Referring Provider: Cathlean Cower Treating Provider/Extender: Melburn Hake,   Weeks in Treatment: 13 Subjective Chief Complaint Information obtained from Patient Patient seen for complaints of Non-Healing Wound to the right lateral calf History of Present Illness (HPI) 72 year old patient was seen by the nurse practitioner at the [facility] primary care for a wound which has been there on her right leg since August 2018. For a period of time she has been treated with doxycycline, clindamycin and Bactrim and the wound continues to have drainage and odor.on 11/20/2016 she was again put on Bactrim DS and referred to the wound center. Past medical history significant for hypertension, congestive heart failure, COPD, dyslipidemia, migraines, pericarditis, status post VSD repair, cholecystectomy tubal ligation, cesarean section, coronary angiogram,my tenotomy with tube placement on the right,dilated cardiomyopathy,tobacco abuse and has been a smoker for the last 35 years. She also has a history of right great saphenous vein ablation but the patient does not confirm this for sure. She has had a venous duplex study done in August 2018 which showed no evidence of lower extremity deep or superficial venous thrombus or incompetence bilaterally. She has a arterial duplex study pending, which is going to be done later today. 12/22/16 on evaluation today patient appears to be doing fairly well in regard to her right lateral lower extremity wounds. She did undergo a right common femoral artery balloon angioplasty and stent placement which was performed on 12/17/16. Patient this is did appear to be a sufficient improvement according to the reviewed notes which is excellent news. Prior to this procedure patient did have a study of the lower extremity this was a Doppler performed on 12/08/16 this revealed moderate right lower extremity arterial disease involving the common femoral artery and iliac segment. There was also left arterial obstruction involving the iliac segment. On the left  there was arterial obstruction involving the iliac segment, common femoral artery, and peroneal artery. Patient states she is still having some soreness left over and residual from the procedure. However overall she appears to be doing very well. She does have a follow-up study with vascular in the next week. 01/02/2017 -- was seen by Dr. Jacqulyn Cane -- she had an abdominal aortogram with lower extremity peripheral vascular intervention, and this showed moderate left common iliac artery stenosis. Short occlusion of the right common femoral artery with no significant infrainguinal disease. Short occlusion of the left common femoral artery with no significant infrainguinal disease and a successful drug-coated balloon angioplasty stent  was placed in the right common femoral artery. She had a lower extremity arterial duplex examination done which showed the resting right ABI is within normal range and no evidence of significant right lower extremity arterial disease. right ABI is 1.08 with a toe pressure being 0.54. there was biphasic flow. The left ABI was 0.77 and the toe and this was 0.38 with monophasic flow.The right toe brachial indicis were abnormal. The left resting ABI indicates moderate left lower extremity arterial disease in the left toe brachial index is abnormal. 01/09/17 on evaluation today patient presents with continued ulcerations noted of the right lower extremity. This does seem to be doing a little bit better but still is slough covered. She has been tolerating the dressing changes fairly well but states that the debridement does tend to bother her pain wise. No fevers, chills, nausea, or vomiting noted at this time. She does tell me that the alginate dressing that she was looking to use following her last evaluation is expensive and insurance will not cover this it's actually cheaper for her to utilize the [place]. 02/06/17 on evaluation today patient's wound appears to be doing  excellent at this point in time. In fact she has been tolerating the dressing changes very well there does not even appear to be any evidence or need for debridement today. She's not [place], [place] B. ([id number]) having any significant pain which is good news. No fevers, chills, nausea, or vomiting noted at this time. Overall she is extremely pleased with how things have progressed. 02/20/17 on evaluation today patient appears to be doing well in regard to her right lower extremity ulcer. In fact this appears to be completely healed. She still has some dry pinpoint scab like areas noted over the lower extremity but fortunately nothing significant and none of these seem to have any wound underneath when they come off it is completely healed underneath. Nonetheless we are gonna let this work itself off not try to do anything to invasive. 03/06/17 on evaluation today patient appears to be doing extremely well in regard to her right lateral lower extremity ulcer site. She appears to be completely healed and there is no evidence of residual ulceration at this time which is great news. Patient History Information obtained from Patient. Family History Cancer - Father, Heart Disease - Siblings, Hypertension - Mother, Tuberculosis - Father, No family history of Diabetes, Hereditary Spherocytosis, Kidney Disease, Lung Disease, Seizures, Stroke, Thyroid Problems. Social History Current every day smoker - 2-3 day, Marital Status - Married, Alcohol Use - Never, Drug Use - No History, Caffeine Use - Daily. Review of Systems (ROS) Constitutional Symptoms (General Health) Denies complaints or symptoms of Fever, Chills. Respiratory The patient has no complaints or symptoms. Cardiovascular The patient has no complaints or symptoms. Psychiatric The patient has no complaints or symptoms. Objective Constitutional Well-nourished and well-hydrated in no acute distress. Vitals Time Taken: 10:45 AM, Height: 61 in,  Weight: 111.9 lbs, BMI: 21.1, Temperature: 97.8 F, Pulse: 59 bpm, Respiratory Rate: 20 breaths/min, Blood Pressure: 103/40 mmHg. Respiratory normal breathing without difficulty. Psychiatric this patient is able to make decisions and demonstrates good insight into disease process. Alert and Oriented x 3. pleasant and cooperative. Kimberly [name], Kimberly [name] ([id number]) Assessment Active Problems ICD-10 [phone] - Non-pressure chronic ulcer of right calf with fat layer exposed I89.0 - Lymphedema, not elsewhere classified I73.9 - Peripheral vascular disease, unspecified F17.218 - Nicotine dependence, cigarettes, with other nicotine-induced disorders Plan Discharge From [facility]: Discharge from [facility] - treatment completed At this time I'm gonna recommend that we discontinue wound care services that she seems to be completely healed obviously this is excellent news. We will see her in the future as needed if anything changes or worsens but otherwise we did discharge her today. Patient was very emotional and please with the fact that this area is doing so well she even was in tears during the office visit. We will see her hopefully just in passing I told her she's welcome to come by anytime and I'll be see if she has any issues she will contact our office. Otherwise it's a pleasure caring for Mrs. Pedregon. Electronic Signature(s) Signed: 03/09/2017 8:04:39 AM By: Worthy Keeler PA-C Entered By: Worthy Keeler on 03/06/2017 17:16:07 Kimberly [name] ([id number]) -------------------------------------------------------------------------------- ROS/PFSH Details Patient Name: Kimberly [name] Date of Service: 03/06/2017 10:15 AM Medical Record Number: [id number] Patient Account Number: [id number] Date of Birth/Sex: 10-26-1945 (72 y.o. Female) Treating RN: Roger Shelter Primary Care Provider: Cathlean Cower Other Clinician: Referring Provider: Cathlean Cower Treating Provider/Extender: Melburn Hake,   Weeks in Treatment: 13 Information Obtained From Patient Wound History Do you currently have one or more open woundso Yes How many open wounds do you currently haveo 1 Approximately how long have you had your woundso aug 2018 How have you been treating your wound(s) until nowo hydrogel ag Has your wound(s) ever healed and then re-openedo No Have you had any lab work done in the past montho No Have you tested positive for an antibiotic resistant organism (MRSA, VRE)o No Have you tested positive for osteomyelitis (bone infection)o No Have you had any tests for circulation on your legso No Have you had other problems associated with your woundso Swelling Constitutional Symptoms (General Health) Complaints and Symptoms: Negative for: Fever; Chills Hematologic/Lymphatic Medical History: Positive for: Anemia - hx Respiratory Complaints and Symptoms: No Complaints or Symptoms Medical History: Positive for: Asthma; Chronic Obstructive Pulmonary Disease (COPD) Cardiovascular Complaints and Symptoms: No Complaints or Symptoms Medical History: Positive for: Congestive Heart Failure Psychiatric Complaints and Symptoms: No Complaints or Symptoms Immunizations Pneumococcal Vaccine: Received Pneumococcal Vaccination: Yes Kimberly [name], Kimberly [name] ([id number]) Implantable Devices Family and Social History Cancer: Yes - Father; Diabetes: No; Heart Disease: Yes - Siblings; Hereditary Spherocytosis: No; Hypertension: Yes - Mother; Kidney Disease: No; Lung Disease: No; Seizures: No; Stroke: No; Thyroid Problems: No; Tuberculosis: Yes - Father; Current every day smoker - 2-3 day; Marital Status - Married; Alcohol Use: Never; Drug Use: No History; Caffeine Use: Daily; Financial Concerns: No; Food, Clothing or Shelter Needs: No; Support System Lacking: No; Transportation Concerns: No; Advanced Directives: No; Patient does not want information on Advanced Directives; Do not resuscitate: No; Living  Will: No; Medical Power of Attorney: No Physician Affirmation I have reviewed and agree with the above information. Electronic Signature(s) Signed: 03/09/2017 8:04:39 AM By: Worthy Keeler PA-C Signed: 03/09/2017 11:15:43 AM By: Roger Shelter Entered By: Worthy Keeler on 03/06/2017 17:15:07 Kimberly [name] ([id number]) -------------------------------------------------------------------------------- SuperBill Details Patient Name: Kimberly [name] Date of Service: 03/06/2017 Medical Record Number: [id number] Patient Account Number: [id number] Date of Birth/Sex: 1945/06/07 (71 y.o. Female) Treating RN: Roger Shelter Primary Care Provider: Cathlean Cower Other Clinician: Referring Provider: Cathlean Cower Treating Provider/Extender: Melburn Hake,  Weeks in Treatment: 13 Diagnosis Coding ICD-10 Codes Code Description [phone] Non-pressure chronic ulcer of right calf with fat layer exposed I89.0 Lymphedema, not elsewhere classified I73.9 Peripheral vascular disease, unspecified F17.218 Nicotine dependence, cigarettes, with other nicotine-induced disorders Facility  Procedures CPT4 Code: [id number] Description: [phone] - WOUND CARE VISIT-LEV 2 EST PT Modifier: Quantity: 1 Physician Procedures CPT4 Code: [id number] Description: [phone] - WC PHYS LEVEL 2 - EST PT ICD-10 Diagnosis Description L97.212 Non-pressure chronic ulcer of right calf with fat layer ex I89.0 Lymphedema, not elsewhere classified I73.9 Peripheral vascular disease, unspecified F17.218 Nicotine  dependence, cigarettes, with other nicotine-induc Modifier: posed ed disorders Quantity: 1 Electronic Signature(s) Signed: 03/09/2017 8:04:39 AM By: Worthy Keeler PA-C Previous Signature: 03/06/2017 1:58:36 PM Version By: Roger Shelter Entered By: Worthy Keeler on 03/06/2017 17:16:33

## 2017-03-10 NOTE — Progress Notes (Signed)
Kimberly [name], Kimberly [name] ([id number]) Visit Report for 03/06/2017 Arrival Information Details Patient Name: Kimberly [name] Date of Service: 03/06/2017 10:15 AM Medical Record Number: [id number] Patient Account Number: [id number] Date of Birth/Sex: 11-Jun-1945 (72 y.o. Female) Treating RN: Roger Shelter Primary Care Marializ Ferrebee: Cathlean Cower Other Clinician: Referring Eraina Winnie: Cathlean Cower Treating Izaiah Tabb/Extender: Melburn Hake, HOYT Weeks in Treatment: 54 Visit Information History Since Last Visit All ordered tests and consults were completed: No Patient Arrived: Ambulatory Added or deleted any medications: No Arrival Time: 10:43 Any new allergies or adverse reactions: No Accompanied By: self Had a fall or experienced change in No Transfer Assistance: None activities of daily living that may affect Patient Identification Verified: Yes risk of falls: Secondary Verification Process Yes Signs or symptoms of abuse/neglect since last visito No Completed: Hospitalized since last visit: No Patient Requires Transmission-Based No Pain Present Now: No Precautions: Patient Has Alerts: Yes Patient Alerts: R ABI 0.62 from EPIC L ABI 0.72 from [facility] Electronic Signature(s) Signed: 03/06/2017 1:58:36 PM By: Roger Shelter Entered By: Roger Shelter on 03/06/2017 10:43:54 Kimberly [name] ([id number]) -------------------------------------------------------------------------------- Clinic Level of Care Assessment Details Patient Name: Kimberly [name] Date of Service: 03/06/2017 10:15 AM Medical Record Number: [id number] Patient Account Number: [id number] Date of Birth/Sex: 1946-01-18 (72 y.o. Female) Treating RN: Roger Shelter Primary Care Kensie Susman: Cathlean Cower Other Clinician: Referring Michelena Culmer: Cathlean Cower Treating Mulki Roesler/Extender: Melburn Hake, HOYT Weeks in Treatment: 13 Clinic Level of Care Assessment Items TOOL 4 Quantity Score []  - Use when only an EandM is performed on FOLLOW-UP visit  0 ASSESSMENTS - Nursing Assessment / Reassessment []  - Reassessment of Co-morbidities (includes updates in patient status) 0 X- 1 5 Reassessment of Adherence to Treatment Plan ASSESSMENTS - Wound and Skin Assessment / Reassessment []  - Simple Wound Assessment / Reassessment - one wound 0 []  - 0 Complex Wound Assessment / Reassessment - multiple wounds X- 1 10 Dermatologic / Skin Assessment (not related to wound area) ASSESSMENTS - Focused Assessment []  - Circumferential Edema Measurements - multi extremities 0 []  - 0 Nutritional Assessment / Counseling / Intervention []  - 0 Lower Extremity Assessment (monofilament, tuning fork, pulses) []  - 0 Peripheral Arterial Disease Assessment (using hand held doppler) ASSESSMENTS - Ostomy and/or Continence Assessment and Care []  - Incontinence Assessment and Management 0 []  - 0 Ostomy Care Assessment and Management (repouching, etc.) PROCESS - Coordination of Care X - Simple Patient / Family Education for ongoing care 1 15 []  - 0 Complex (extensive) Patient / Family Education for ongoing care []  - 0 Staff obtains Programmer, systems, Records, Test Results / Process Orders []  - 0 Staff telephones HHA, Nursing Homes / Clarify orders / etc []  - 0 Routine Transfer to another Facility (non-emergent condition) []  - 0 Routine Hospital Admission (non-emergent condition) []  - 0 New Admissions / Biomedical engineer / Ordering NPWT, Apligraf, etc. []  - 0 Emergency Hospital Admission (emergent condition) X- 1 10 Simple Discharge Coordination [name], [name] ([id number]) []  - 0 Complex (extensive) Discharge Coordination PROCESS - Special Needs []  - Pediatric / Minor Patient Management 0 []  - 0 Isolation Patient Management []  - 0 Hearing / Language / Visual special needs []  - 0 Assessment of Community assistance (transportation, D/C planning, etc.) []  - 0 Additional assistance / Altered mentation []  - 0 Support Surface(s) Assessment (bed,  cushion, seat, etc.) INTERVENTIONS - Wound Cleansing / Measurement []  - Simple Wound Cleansing - one wound 0 []  - 0 Complex Wound Cleansing - multiple wounds []  - 0 Wound Imaging (  photographs - any number of wounds) []  - 0 Wound Tracing (instead of photographs) []  - 0 Simple Wound Measurement - one wound []  - 0 Complex Wound Measurement - multiple wounds INTERVENTIONS - Wound Dressings []  - Small Wound Dressing one or multiple wounds 0 []  - 0 Medium Wound Dressing one or multiple wounds []  - 0 Large Wound Dressing one or multiple wounds []  - 0 Application of Medications - topical []  - 0 Application of Medications - injection INTERVENTIONS - Miscellaneous []  - External ear exam 0 []  - 0 Specimen Collection (cultures, biopsies, blood, body fluids, etc.) []  - 0 Specimen(s) / Culture(s) sent or taken to Lab for analysis []  - 0 Patient Transfer (multiple staff / Civil Service fast streamer / Similar devices) []  - 0 Simple Staple / Suture removal (25 or less) []  - 0 Complex Staple / Suture removal (26 or more) []  - 0 Hypo / Hyperglycemic Management (close monitor of Blood Glucose) []  - 0 Ankle / Brachial Index (ABI) - do not check if billed separately X- 1 5 Vital Signs [name], [name] ([id number]) Has the patient been seen at the hospital within the last three years: Yes Total Score: 45 Level Of Care: New/Established - Level 2 Electronic Signature(s) Signed: 03/06/2017 1:58:36 PM By: Roger Shelter Entered By: Roger Shelter on 03/06/2017 11:13:10 Kimberly [name] ([id number]) -------------------------------------------------------------------------------- Encounter Discharge Information Details Patient Name: Kimberly [name] Date of Service: 03/06/2017 10:15 AM Medical Record Number: [id number] Patient Account Number: [id number] Date of Birth/Sex: May 15, 1945 (72 y.o. Female) Treating RN: Roger Shelter Primary Care Zamzam Whinery: Cathlean Cower Other Clinician: Referring Natlie Asfour: Cathlean Cower Treating Clinten Howk/Extender: Melburn Hake, HOYT Weeks in Treatment: 72 Encounter Discharge Information Items Discharge Pain Level: 0 Discharge Condition: Stable Ambulatory Status: Ambulatory Discharge Destination: Home Transportation: Private Auto Accompanied By: self Schedule Follow-up Appointment: No Medication Reconciliation completed and No provided to Patient/Care Lashun Mccants: Provided on Clinical Summary of Care: 03/06/2017 Form Type Recipient Paper Patient MW Electronic Signature(s) Signed: 03/09/2017 4:58:43 PM By: Ruthine Dose Entered By: Ruthine Dose on 03/06/2017 11:18:53 Kimberly [name] ([id number]) -------------------------------------------------------------------------------- Lower Extremity Assessment Details Patient Name: Kimberly [name] Date of Service: 03/06/2017 10:15 AM Medical Record Number: [id number] Patient Account Number: [id number] Date of Birth/Sex: 10-04-1945 (72 y.o. Female) Treating RN: Roger Shelter Primary Care Lamin Chandley: Cathlean Cower Other Clinician: Referring Nana Vastine: Cathlean Cower Treating Payten Hobin/Extender: Melburn Hake, HOYT Weeks in Treatment: 13 Edema Assessment Assessed: [Left: No] [Right: No] Edema: [Left: N] [Right: o] Vascular Assessment Claudication: Claudication Assessment [Right:None] Pulses: Dorsalis Pedis Palpable: [Right:Yes] Posterior Tibial Extremity colors, hair growth, and conditions: Extremity Color: [Right:Normal] Hair Growth on Extremity: [Right:Yes] Temperature of Extremity: [Right:Cool] Capillary Refill: [Right:< 3 seconds] Toe Nail Assessment Left: Right: Thick: No Discolored: No Deformed: No Improper Length and Hygiene: No Electronic Signature(s) Signed: 03/06/2017 1:58:36 PM By: Roger Shelter Entered By: Roger Shelter on 03/06/2017 10:49:25 Kimberly [name] ([id number]) -------------------------------------------------------------------------------- Multi Wound Chart Details Patient Name: Kimberly [name] Date of Service: 03/06/2017 10:15 AM Medical Record Number: [id number] Patient Account Number: [id number] Date of Birth/Sex: Jun 13, 1945 (72 y.o. Female) Treating RN: Roger Shelter Primary Care Alison Kubicki: Cathlean Cower Other Clinician: Referring Olanda Downie: Cathlean Cower Treating Ben Habermann/Extender: Melburn Hake, HOYT Weeks in Treatment: 13 Vital Signs Height(in): 61 Pulse(bpm): 59 Weight(lbs): 111.9 Blood Pressure(mmHg): 103/40 Body Mass Index(BMI): 21 Temperature(F): 97.8 Respiratory Rate 20 (breaths/min): Wound Assessments Treatment Notes Electronic Signature(s) Signed: 03/06/2017 1:58:36 PM By: Roger Shelter Entered By: Roger Shelter on 03/06/2017 10:50:25 Kimberly [name] ([id number]) -------------------------------------------------------------------------------- Multi-Disciplinary Care Plan Details  Patient Name: SHANEQUE, MERKLE Date of Service: 03/06/2017 10:15 AM Medical Record Number: [id number] Patient Account Number: [id number] Date of Birth/Sex: 06/08/1945 (72 y.o. Female) Treating RN: Roger Shelter Primary Care Jerimie Mancuso: Cathlean Cower Other Clinician: Referring Metha Kolasa: Cathlean Cower Treating Erian Rosengren/Extender: Melburn Hake, HOYT Weeks in Treatment: 13 Active Inactive Electronic Signature(s) Signed: 03/06/2017 1:58:36 PM By: Roger Shelter Entered By: Roger Shelter on 03/06/2017 11:15:34 Kimberly [name] ([id number]) -------------------------------------------------------------------------------- Pain Assessment Details Patient Name: Kimberly [name] Date of Service: 03/06/2017 10:15 AM Medical Record Number: [id number] Patient Account Number: [id number] Date of Birth/Sex: 01/30/1945 (72 y.o. Female) Treating RN: Roger Shelter Primary Care Elon Lomeli: Cathlean Cower Other Clinician: Referring Annistyn Depass: Cathlean Cower Treating Jaqueline Uber/Extender: Melburn Hake, HOYT Weeks in Treatment: 13 Active Problems Location of Pain Severity and Description of Pain Patient Has Paino  No Site Locations Pain Management and Medication Current Pain Management: Electronic Signature(s) Signed: 03/06/2017 1:58:36 PM By: Roger Shelter Entered By: Roger Shelter on 03/06/2017 10:45:12 Kimberly [name] ([id number]) -------------------------------------------------------------------------------- Patient/Caregiver Education Details Patient Name: Kimberly [name] Date of Service: 03/06/2017 10:15 AM Medical Record Number: [id number] Patient Account Number: [id number] Date of Birth/Gender: [birth year] (72 y.o. Female) Treating RN: Roger Shelter Primary Care Physician: Cathlean Cower Other Clinician: Referring Physician: Cathlean Cower Treating Physician/Extender: Sharalyn Ink in Treatment: 13 Education Assessment Education Provided To: Patient Education Topics Provided Wound/Skin Impairment: Handouts: Skin Care Do's and Dont's Methods: Explain/Verbal Responses: State content correctly Electronic Signature(s) Signed: 03/06/2017 1:58:36 PM By: Roger Shelter Entered By: Roger Shelter on 03/06/2017 11:14:49 Kimberly [name] ([id number]) -------------------------------------------------------------------------------- Vitals Details Patient Name: Kimberly [name] Date of Service: 03/06/2017 10:15 AM Medical Record Number: [id number] Patient Account Number: [id number] Date of Birth/Sex: July 07, 1945 (72 y.o. Female) Treating RN: Roger Shelter Primary Care Kanaan Kagawa: Cathlean Cower Other Clinician: Referring Keontae Levingston: Cathlean Cower Treating Kirstine Jacquin/Extender: Melburn Hake, HOYT Weeks in Treatment: 13 Vital Signs Time Taken: 10:45 Temperature (F): 97.8 Height (in): 61 Pulse (bpm): 59 Weight (lbs): 111.9 Respiratory Rate (breaths/min): 20 Body Mass Index (BMI): 21.1 Blood Pressure (mmHg): 103/40 Reference Range: 80 - 120 mg / dl Electronic Signature(s) Signed: 03/06/2017 1:58:36 PM By: Roger Shelter Entered By: Roger Shelter on 03/06/2017 10:45:59

## 2017-03-11 ENCOUNTER — Telehealth: Payer: Self-pay | Admitting: Internal Medicine

## 2017-03-11 NOTE — Telephone Encounter (Signed)
Patient informed. 

## 2017-03-11 NOTE — Telephone Encounter (Signed)
Sent to [facility]: This patient is not on spironolactone. Also, do you want her to take the potassium every other day also?   See response below:  Larey Dresser, MD  Merlene Laughter, LPN        yes

## 2017-03-11 NOTE — Telephone Encounter (Signed)
See note from 03/06/2017.

## 2017-03-11 NOTE — Telephone Encounter (Signed)
Pt returning your call

## 2017-03-11 NOTE — Telephone Encounter (Signed)
Spoke with patient and reviewed Dr. Claris Gladden recommendations.. Patient verbalized understanding.

## 2017-03-12 ENCOUNTER — Telehealth: Payer: Self-pay | Admitting: Internal Medicine

## 2017-03-12 DIAGNOSIS — I420 Dilated cardiomyopathy: Secondary | ICD-10-CM

## 2017-03-12 DIAGNOSIS — I5023 Acute on chronic systolic (congestive) heart failure: Secondary | ICD-10-CM

## 2017-03-12 NOTE — Telephone Encounter (Signed)
Pt called for ICD placement.  Pt requesting 03/17/2017.  Pt will get labs in [place].  Pt to pick up instruction letter and soap at [place] office.

## 2017-03-12 NOTE — Telephone Encounter (Signed)
New message   Pt is calling about a defib that she is suppose to give dates that she is available to get it set up

## 2017-03-13 ENCOUNTER — Other Ambulatory Visit
Admission: RE | Admit: 2017-03-13 | Discharge: 2017-03-13 | Disposition: A | Discharge status: Home / Self Care | Payer: Medicare Other | Source: Ambulatory Visit | Attending: Internal Medicine | Admitting: Internal Medicine

## 2017-03-13 DIAGNOSIS — I420 Dilated cardiomyopathy: Secondary | ICD-10-CM | POA: Diagnosis not present

## 2017-03-13 DIAGNOSIS — I5023 Acute on chronic systolic (congestive) heart failure: Secondary | ICD-10-CM | POA: Insufficient documentation

## 2017-03-13 LAB — CBC WITH DIFFERENTIAL/PLATELET
BASOS ABS: 0.1 10*3/uL (ref 0–0.1)
Basophils Relative: 1 %
EOS PCT: 2 %
Eosinophils Absolute: 0.1 10*3/uL (ref 0–0.7)
HCT: 42.2 % (ref 35.0–47.0)
Hemoglobin: 14.1 g/dL (ref 12.0–16.0)
LYMPHS ABS: 1.7 10*3/uL (ref 1.0–3.6)
LYMPHS PCT: 28 %
MCH: 29.9 pg (ref 26.0–34.0)
MCHC: 33.3 g/dL (ref 32.0–36.0)
MCV: 89.6 fL (ref 80.0–100.0)
Monocytes Absolute: 0.5 10*3/uL (ref 0.2–0.9)
Monocytes Relative: 9 %
Neutro Abs: 3.6 10*3/uL (ref 1.4–6.5)
Neutrophils Relative %: 60 %
PLATELETS: 276 10*3/uL (ref 150–440)
RBC: 4.71 MIL/uL (ref 3.80–5.20)
RDW: 15.9 % — AB (ref 11.5–14.5)
WBC: 5.9 10*3/uL (ref 3.6–11.0)

## 2017-03-13 LAB — BASIC METABOLIC PANEL
Anion gap: 8 (ref 5–15)
BUN: 15 mg/dL (ref 6–20)
CO2: 28 mmol/L (ref 22–32)
Calcium: 9.2 mg/dL (ref 8.9–10.3)
Chloride: 101 mmol/L (ref 101–111)
Creatinine, Ser: 1.14 mg/dL — ABNORMAL HIGH (ref 0.44–1.00)
GFR calc Af Amer: 55 mL/min — ABNORMAL LOW (ref >60)
GFR, EST NON AFRICAN AMERICAN: 48 mL/min — AB (ref >60)
Glucose, Bld: 135 mg/dL — ABNORMAL HIGH (ref 65–99)
POTASSIUM: 4.5 mmol/L (ref 3.5–5.1)
Sodium: 137 mmol/L (ref 135–145)

## 2017-03-16 ENCOUNTER — Encounter (HOSPITAL_COMMUNITY): Payer: Medicare Other | Admitting: Cardiology

## 2017-03-17 ENCOUNTER — Other Ambulatory Visit: Payer: Self-pay

## 2017-03-17 ENCOUNTER — Observation Stay (HOSPITAL_COMMUNITY)
Admission: RE | Admit: 2017-03-17 | Discharge: 2017-03-18 | Disposition: A | Discharge status: Home / Self Care | Payer: Medicare Other | Source: Ambulatory Visit | Attending: Internal Medicine | Admitting: Internal Medicine

## 2017-03-17 ENCOUNTER — Encounter (HOSPITAL_COMMUNITY): Payer: Self-pay | Admitting: *Deleted

## 2017-03-17 ENCOUNTER — Encounter (HOSPITAL_COMMUNITY)
Admission: RE | Disposition: A | Discharge status: Home / Self Care | Payer: Self-pay | Source: Ambulatory Visit | Attending: Internal Medicine

## 2017-03-17 DIAGNOSIS — Z885 Allergy status to narcotic agent status: Secondary | ICD-10-CM | POA: Diagnosis not present

## 2017-03-17 DIAGNOSIS — I5022 Chronic systolic (congestive) heart failure: Secondary | ICD-10-CM

## 2017-03-17 DIAGNOSIS — I252 Old myocardial infarction: Secondary | ICD-10-CM | POA: Insufficient documentation

## 2017-03-17 DIAGNOSIS — Q261 Persistent left superior vena cava: Secondary | ICD-10-CM | POA: Insufficient documentation

## 2017-03-17 DIAGNOSIS — I110 Hypertensive heart disease with heart failure: Secondary | ICD-10-CM | POA: Diagnosis not present

## 2017-03-17 DIAGNOSIS — I472 Ventricular tachycardia, unspecified: Secondary | ICD-10-CM

## 2017-03-17 DIAGNOSIS — I428 Other cardiomyopathies: Secondary | ICD-10-CM | POA: Diagnosis not present

## 2017-03-17 DIAGNOSIS — F17210 Nicotine dependence, cigarettes, uncomplicated: Secondary | ICD-10-CM | POA: Insufficient documentation

## 2017-03-17 DIAGNOSIS — Z7982 Long term (current) use of aspirin: Secondary | ICD-10-CM | POA: Diagnosis not present

## 2017-03-17 DIAGNOSIS — E785 Hyperlipidemia, unspecified: Secondary | ICD-10-CM | POA: Insufficient documentation

## 2017-03-17 DIAGNOSIS — Z7902 Long term (current) use of antithrombotics/antiplatelets: Secondary | ICD-10-CM | POA: Diagnosis not present

## 2017-03-17 DIAGNOSIS — I739 Peripheral vascular disease, unspecified: Secondary | ICD-10-CM | POA: Insufficient documentation

## 2017-03-17 DIAGNOSIS — J449 Chronic obstructive pulmonary disease, unspecified: Secondary | ICD-10-CM | POA: Diagnosis not present

## 2017-03-17 DIAGNOSIS — F419 Anxiety disorder, unspecified: Secondary | ICD-10-CM | POA: Diagnosis not present

## 2017-03-17 DIAGNOSIS — K219 Gastro-esophageal reflux disease without esophagitis: Secondary | ICD-10-CM | POA: Diagnosis not present

## 2017-03-17 DIAGNOSIS — Z95810 Presence of automatic (implantable) cardiac defibrillator: Secondary | ICD-10-CM

## 2017-03-17 DIAGNOSIS — I429 Cardiomyopathy, unspecified: Secondary | ICD-10-CM | POA: Diagnosis not present

## 2017-03-17 HISTORY — PX: BIV ICD INSERTION CRT-D: EP1195

## 2017-03-17 HISTORY — DX: Presence of automatic (implantable) cardiac defibrillator: Z95.810

## 2017-03-17 HISTORY — PX: INSERTION OF ICD: SHX6689

## 2017-03-17 LAB — GLUCOSE, CAPILLARY: Glucose-Capillary: 99 mg/dL (ref 65–99)

## 2017-03-17 LAB — SURGICAL PCR SCREEN
MRSA, PCR: NEGATIVE
Staphylococcus aureus: POSITIVE — AB

## 2017-03-17 SURGERY — BIV ICD INSERTION CRT-D

## 2017-03-17 MED ORDER — FENTANYL CITRATE (PF) 100 MCG/2ML IJ SOLN
INTRAMUSCULAR | Status: DC | PRN
  Administered 2017-03-17: 25 ug via INTRAVENOUS
  Administered 2017-03-17: 12.5 ug via INTRAVENOUS
  Administered 2017-03-17: 25 ug via INTRAVENOUS
  Administered 2017-03-17 (×3): 12.5 ug via INTRAVENOUS
  Administered 2017-03-17: 25 ug via INTRAVENOUS

## 2017-03-17 MED ORDER — IOPAMIDOL (ISOVUE-370) INJECTION 76%
INTRAVENOUS | Status: AC
  Filled 2017-03-17: qty 50

## 2017-03-17 MED ORDER — SODIUM CHLORIDE 0.9 % IR SOLN
80.0000 mg | Status: AC
  Administered 2017-03-17: 80 mg
  Filled 2017-03-17: qty 2

## 2017-03-17 MED ORDER — CLOPIDOGREL BISULFATE 75 MG PO TABS
75.0000 mg | ORAL_TABLET | Freq: Every day | ORAL | Status: DC
  Administered 2017-03-18: 75 mg via ORAL
  Filled 2017-03-17: qty 1

## 2017-03-17 MED ORDER — MIDAZOLAM HCL 5 MG/5ML IJ SOLN
INTRAMUSCULAR | Status: AC
  Filled 2017-03-17: qty 5

## 2017-03-17 MED ORDER — CELECOXIB 100 MG PO CAPS: 100.0000 mg | ORAL_CAPSULE | Freq: Two times a day (BID) | ORAL | Status: DC | PRN

## 2017-03-17 MED ORDER — LIDOCAINE HCL (PF) 1 % IJ SOLN
INTRAMUSCULAR | Status: DC | PRN
  Administered 2017-03-17 (×2): 60 mL

## 2017-03-17 MED ORDER — LIDOCAINE HCL (PF) 1 % IJ SOLN
INTRAMUSCULAR | Status: AC
  Filled 2017-03-17: qty 30

## 2017-03-17 MED ORDER — FUROSEMIDE 20 MG PO TABS
20.0000 mg | ORAL_TABLET | Freq: Every day | ORAL | Status: DC
  Administered 2017-03-17 – 2017-03-18 (×2): 20 mg via ORAL
  Filled 2017-03-17 (×2): qty 1

## 2017-03-17 MED ORDER — ONDANSETRON HCL 4 MG/2ML IJ SOLN
4.0000 mg | Freq: Four times a day (QID) | INTRAMUSCULAR | Status: DC | PRN
Start: 2017-03-17 — End: 2017-03-18

## 2017-03-17 MED ORDER — NAPROXEN SODIUM 220 MG PO TABS: 220.0000 mg | ORAL_TABLET | Freq: Every day | ORAL | Status: DC | PRN

## 2017-03-17 MED ORDER — BUPROPION HCL ER (SR) 100 MG PO TB12
100.0000 mg | ORAL_TABLET | Freq: Every day | ORAL | Status: DC
  Administered 2017-03-17 – 2017-03-18 (×2): 100 mg via ORAL
  Filled 2017-03-17 (×2): qty 1

## 2017-03-17 MED ORDER — ALPRAZOLAM 0.5 MG PO TABS
0.5000 mg | ORAL_TABLET | Freq: Every day | ORAL | Status: DC
  Administered 2017-03-17: 0.5 mg via ORAL
  Filled 2017-03-17: qty 1

## 2017-03-17 MED ORDER — CARVEDILOL 3.125 MG PO TABS
3.1250 mg | ORAL_TABLET | Freq: Two times a day (BID) | ORAL | Status: DC
  Administered 2017-03-17 – 2017-03-18 (×2): 3.125 mg via ORAL
  Filled 2017-03-17 (×2): qty 1

## 2017-03-17 MED ORDER — ROSUVASTATIN CALCIUM 10 MG PO TABS
10.0000 mg | ORAL_TABLET | Freq: Every day | ORAL | Status: DC
  Administered 2017-03-18: 10 mg via ORAL
  Filled 2017-03-17: qty 1

## 2017-03-17 MED ORDER — CHLORHEXIDINE GLUCONATE 4 % EX LIQD
60.0000 mL | Freq: Once | CUTANEOUS | Status: DC
  Filled 2017-03-17: qty 60

## 2017-03-17 MED ORDER — SODIUM CHLORIDE 0.9 % IR SOLN
80.0000 mg | Status: DC
  Filled 2017-03-17: qty 2

## 2017-03-17 MED ORDER — HEPARIN (PORCINE) IN NACL 2-0.9 UNIT/ML-% IJ SOLN
INTRAMUSCULAR | Status: AC
  Filled 2017-03-17: qty 500

## 2017-03-17 MED ORDER — DIGOXIN 125 MCG PO TABS
0.1250 mg | ORAL_TABLET | Freq: Every day | ORAL | Status: DC
  Administered 2017-03-18: 0.125 mg via ORAL
  Filled 2017-03-17: qty 1

## 2017-03-17 MED ORDER — ASPIRIN EC 81 MG PO TBEC
81.0000 mg | DELAYED_RELEASE_TABLET | Freq: Every day | ORAL | Status: DC
  Administered 2017-03-17 – 2017-03-18 (×2): 81 mg via ORAL
  Filled 2017-03-17 (×2): qty 1

## 2017-03-17 MED ORDER — SODIUM CHLORIDE 0.9 % IR SOLN
Status: AC
  Filled 2017-03-17: qty 2

## 2017-03-17 MED ORDER — ACETAMINOPHEN 325 MG PO TABS
325.0000 mg | ORAL_TABLET | ORAL | Status: DC | PRN
  Administered 2017-03-17 – 2017-03-18 (×2): 650 mg via ORAL
  Filled 2017-03-17 (×2): qty 2

## 2017-03-17 MED ORDER — CEFAZOLIN SODIUM-DEXTROSE 2-4 GM/100ML-% IV SOLN
2.0000 g | INTRAVENOUS | Status: DC
  Filled 2017-03-17: qty 100

## 2017-03-17 MED ORDER — CEFAZOLIN SODIUM-DEXTROSE 2-4 GM/100ML-% IV SOLN
2.0000 g | INTRAVENOUS | Status: AC
  Administered 2017-03-17: 2 g via INTRAVENOUS
  Filled 2017-03-17: qty 100

## 2017-03-17 MED ORDER — CEFAZOLIN SODIUM-DEXTROSE 2-4 GM/100ML-% IV SOLN
INTRAVENOUS | Status: AC
  Filled 2017-03-17: qty 100

## 2017-03-17 MED ORDER — IBUPROFEN 200 MG PO TABS: 200.0000 mg | ORAL_TABLET | Freq: Every day | ORAL | Status: DC | PRN

## 2017-03-17 MED ORDER — HEPARIN (PORCINE) IN NACL 2-0.9 UNIT/ML-% IJ SOLN
INTRAMUSCULAR | Status: AC | PRN
  Administered 2017-03-17: 500 mL

## 2017-03-17 MED ORDER — SODIUM CHLORIDE 0.9 % IV SOLN
INTRAVENOUS | Status: DC
  Administered 2017-03-17: 150 mL via INTRAVENOUS
  Administered 2017-03-17: 250 mL via INTRAVENOUS

## 2017-03-17 MED ORDER — ALBUTEROL SULFATE (2.5 MG/3ML) 0.083% IN NEBU: 2.5000 mg | INHALATION_SOLUTION | Freq: Four times a day (QID) | RESPIRATORY_TRACT | Status: DC | PRN

## 2017-03-17 MED ORDER — FENTANYL CITRATE (PF) 100 MCG/2ML IJ SOLN
INTRAMUSCULAR | Status: AC
  Filled 2017-03-17: qty 2

## 2017-03-17 MED ORDER — SACUBITRIL-VALSARTAN 24-26 MG PO TABS
1.0000 | ORAL_TABLET | Freq: Two times a day (BID) | ORAL | Status: DC
  Administered 2017-03-17 – 2017-03-18 (×2): 1 via ORAL
  Filled 2017-03-17 (×2): qty 1

## 2017-03-17 MED ORDER — IOPAMIDOL (ISOVUE-370) INJECTION 76%
INTRAVENOUS | Status: DC | PRN
Start: 2017-03-17 — End: 2017-03-17
  Administered 2017-03-17: 15 mL
  Administered 2017-03-17: 25 mL

## 2017-03-17 MED ORDER — CEFAZOLIN SODIUM-DEXTROSE 1-4 GM/50ML-% IV SOLN
1.0000 g | Freq: Four times a day (QID) | INTRAVENOUS | Status: AC
  Administered 2017-03-17 – 2017-03-18 (×3): 1 g via INTRAVENOUS
  Filled 2017-03-17 (×3): qty 50

## 2017-03-17 MED ORDER — MIDAZOLAM HCL 5 MG/5ML IJ SOLN
INTRAMUSCULAR | Status: DC | PRN
  Administered 2017-03-17 (×11): 1 mg via INTRAVENOUS

## 2017-03-17 MED ORDER — MUPIROCIN 2 % EX OINT
TOPICAL_OINTMENT | CUTANEOUS | Status: AC
  Filled 2017-03-17: qty 22

## 2017-03-17 MED ORDER — HYDROCOD POLST-CPM POLST ER 10-8 MG/5ML PO SUER: 5.0000 mL | Freq: Two times a day (BID) | ORAL | Status: DC | PRN

## 2017-03-17 MED ORDER — POTASSIUM CHLORIDE CRYS ER 10 MEQ PO TBCR
10.0000 meq | EXTENDED_RELEASE_TABLET | Freq: Every day | ORAL | Status: DC
  Administered 2017-03-17 – 2017-03-18 (×2): 10 meq via ORAL
  Filled 2017-03-17 (×4): qty 1

## 2017-03-17 MED ORDER — ENSURE ENLIVE PO LIQD
237.0000 mL | Freq: Two times a day (BID) | ORAL | Status: DC
  Administered 2017-03-18: 237 mL via ORAL

## 2017-03-17 SURGICAL SUPPLY — 17 items
CABLE SURGICAL S-101-97-12 (CABLE) ×6 IMPLANT
CATH ACUITYPRO EH ST 9F (CATHETERS) ×2
CATH GDCS-EH ST R CVD 45X9 (CATHETERS) ×1 IMPLANT
CATH HEX JOSEPH 2-5-2 65CM 6F (CATHETERS) ×3 IMPLANT
ICD VIGILANT DF4 G247 (ICD Generator) ×3 IMPLANT
INGEVITY MRI 7740-45CM (Lead) ×3 IMPLANT
KIT ESSENTIALS PG (KITS) ×6 IMPLANT
LEAD ACUITY X4 4671 (Lead) ×3 IMPLANT
LEAD PACING INGEVITY MRI 45CM (Lead) ×1 IMPLANT
LEAD RELIANCE G DF4 0292 (Lead) ×3 IMPLANT
PAD DEFIB LIFELINK (PAD) ×3 IMPLANT
SHEATH CLASSIC 7F (SHEATH) ×6 IMPLANT
SHEATH CLASSIC 9.5F (SHEATH) ×6 IMPLANT
SHEATH CLASSIC 9F (SHEATH) ×6 IMPLANT
TRAY PACEMAKER INSERTION (PACKS) ×6 IMPLANT
WIRE HI TORQ VERSACORE-J 145CM (WIRE) ×3 IMPLANT
WIRE MAILMAN 182CM (WIRE) ×3 IMPLANT

## 2017-03-17 NOTE — Interval H&P Note (Signed)
History and Physical Interval Note:Since I last saw the patient her QRS demonstrates an IVCD with QRS duration of 160 ms. Class 3 chf and EF 15% suggest a biv icd would be recommended. She wishes to proceed.  03/17/2017 11:19 AM  Kimberly [name]  has presented today for surgery, with the diagnosis of chronic systolic heart ffailure, cardiomyopathy  The various methods of treatment have been discussed with the patient and family. After consideration of risks, benefits and other options for treatment, the patient has consented to  Procedure(s): BIV ICD INSERTION CRT-D (N/A) as a surgical intervention .  The patient's history has been reviewed, patient examined, no change in status, stable for surgery.  I have reviewed the patient's chart and labs.  Questions were answered to the patient's satisfaction.     Cristopher Peru

## 2017-03-18 ENCOUNTER — Observation Stay (HOSPITAL_COMMUNITY): Payer: Medicare Other

## 2017-03-18 ENCOUNTER — Encounter (HOSPITAL_COMMUNITY): Payer: Self-pay | Admitting: Internal Medicine

## 2017-03-18 ENCOUNTER — Telehealth: Payer: Self-pay | Admitting: *Deleted

## 2017-03-18 DIAGNOSIS — Z885 Allergy status to narcotic agent status: Secondary | ICD-10-CM | POA: Diagnosis not present

## 2017-03-18 DIAGNOSIS — I739 Peripheral vascular disease, unspecified: Secondary | ICD-10-CM | POA: Diagnosis not present

## 2017-03-18 DIAGNOSIS — Q261 Persistent left superior vena cava: Secondary | ICD-10-CM | POA: Diagnosis not present

## 2017-03-18 DIAGNOSIS — I110 Hypertensive heart disease with heart failure: Secondary | ICD-10-CM | POA: Diagnosis not present

## 2017-03-18 DIAGNOSIS — Z7982 Long term (current) use of aspirin: Secondary | ICD-10-CM | POA: Diagnosis not present

## 2017-03-18 DIAGNOSIS — I5022 Chronic systolic (congestive) heart failure: Secondary | ICD-10-CM

## 2017-03-18 DIAGNOSIS — I252 Old myocardial infarction: Secondary | ICD-10-CM | POA: Diagnosis not present

## 2017-03-18 DIAGNOSIS — K219 Gastro-esophageal reflux disease without esophagitis: Secondary | ICD-10-CM | POA: Diagnosis not present

## 2017-03-18 DIAGNOSIS — I428 Other cardiomyopathies: Secondary | ICD-10-CM | POA: Diagnosis not present

## 2017-03-18 DIAGNOSIS — Z95810 Presence of automatic (implantable) cardiac defibrillator: Secondary | ICD-10-CM | POA: Diagnosis not present

## 2017-03-18 DIAGNOSIS — J449 Chronic obstructive pulmonary disease, unspecified: Secondary | ICD-10-CM | POA: Diagnosis not present

## 2017-03-18 DIAGNOSIS — E785 Hyperlipidemia, unspecified: Secondary | ICD-10-CM | POA: Diagnosis not present

## 2017-03-18 DIAGNOSIS — Z7902 Long term (current) use of antithrombotics/antiplatelets: Secondary | ICD-10-CM | POA: Diagnosis not present

## 2017-03-18 LAB — GLUCOSE, CAPILLARY: Glucose-Capillary: 143 mg/dL — ABNORMAL HIGH (ref 65–99)

## 2017-03-18 MED ORDER — TRAMADOL HCL 50 MG PO TABS: 50.0000 mg | ORAL_TABLET | Freq: Four times a day (QID) | ORAL | 0 refills | Status: DC | PRN

## 2017-03-18 MED ORDER — TRAMADOL HCL 50 MG PO TABS
50.0000 mg | ORAL_TABLET | Freq: Four times a day (QID) | ORAL | Status: DC | PRN
  Administered 2017-03-18: 50 mg via ORAL
  Filled 2017-03-18: qty 1

## 2017-03-18 MED ORDER — IBUPROFEN 200 MG PO TABS
400.0000 mg | ORAL_TABLET | Freq: Once | ORAL | Status: AC
  Administered 2017-03-18: 400 mg via ORAL
  Filled 2017-03-18: qty 2

## 2017-03-18 MED ORDER — CLOPIDOGREL BISULFATE 75 MG PO TABS: 75.0000 mg | ORAL_TABLET | Freq: Every day | ORAL | 6 refills | Status: DC

## 2017-03-18 NOTE — Progress Notes (Signed)
2Z22[name], Jerilynn Mages.S/P BiV ICD. Increased pain this morn. Tylenol given w/o relief.states Percocet makes her N/V.has taken ibuprophen and ultram in the past.

## 2017-03-18 NOTE — Discharge Summary (Addendum)
ELECTROPHYSIOLOGY PROCEDURE DISCHARGE SUMMARY    Patient ID: Kimberly [name],  MRN: [id number], DOB/AGE: 1945-09-12 72 y.o.  Admit date: 03/17/2017 Discharge date: 03/18/2017  Primary Care Physician: Biagio Borg, MD Primary Cardiologist: Aundra Dubin Electrophysiologist: Lovena Le  Primary Discharge Diagnosis:  NICM, CHF s/p CRTD implant this admission  Secondary Discharge Diagnosis:  1.  PAD 2.  HTN 3.  Atrial tachycardia s/p ablation  Allergies  Allergen Reactions  . Mupirocin Other (See Comments)    Burning, pain, swelling and sob  . Codeine Nausea Only     Procedures This Admission:  1.  Implantation of a BSX CRTD on 03/17/17 by Dr Lovena Le.  Device was implanted right sided 2/2 persistent LSVC.  See op note for full details.  There were no immediate post procedure complications. 2.  CXR on 03/18/17 demonstrated no pneumothorax status post device implantation.   Brief HPI: Kimberly [name] is a 72 y.o. female was referred to electrophysiology in the outpatient setting for consideration of ICD implantation.  Past medical history includes NICM, CHF.  The patient has persistent LV dysfunction despite guideline directed therapy.  Risks, benefits, and alternatives to ICD implantation were reviewed with the patient who wished to proceed.   Hospital Course:  The patient was admitted and underwent implantation of a BSX CRTD with details as outlined above. She was monitored on telemetry overnight which demonstrated sinus rhythm with CRT pacing, PVC's.  Left and right chest were without hematoma or ecchymosis.  The device was interrogated and found to be functioning normally.  CXR was obtained and demonstrated no pneumothorax status post device implantation.  Wound care, arm mobility, and restrictions were reviewed with the patient.  The patient was examined and considered stable for discharge to home.   The patient's discharge medications include an ACE-I (Entresto) and beta blocker (Coreg).    Physical Exam: Vitals:   03/17/17 1716 03/17/17 1956 03/17/17 2137 03/18/17 0538  BP: (!) 87/61 111/63 (!) 114/52 (!) 120/51  Pulse:  62  67  Resp: 16 (!) 21  17  Temp:  98.9 F (37.2 C)  98 F (36.7 C)  TempSrc:  Oral    SpO2:  98%  99%  Weight:    111 lb 8.8 oz (50.6 kg)  Height:        GEN- The patient is well appearing, alert and oriented x 3 today.   HEENT: normocephalic, atraumatic; sclera clear, conjunctiva pink; hearing intact; oropharynx clear; neck supple  Lungs- Clear to ausculation bilaterally, normal work of breathing.  No wheezes, rales, rhonchi Heart- Regular rate and rhythm (paced) GI- soft, non-tender, non-distended, bowel sounds present Extremities- no clubbing, cyanosis, or edema  MS- no significant deformity or atrophy Skin- warm and dry, no rash or lesion, left and right chest without hematoma/ecchymosis Psych- euthymic mood, full affect Neuro- strength and sensation are intact   Labs:   Lab Results  Component Value Date   WBC 5.9 03/13/2017   HGB 14.1 03/13/2017   HCT 42.2 03/13/2017   MCV 89.6 03/13/2017   PLT 276 03/13/2017    Recent Labs  Lab 03/13/17 0930  NA 137  K 4.5  CL 101  CO2 28  BUN 15  CREATININE 1.14*  CALCIUM 9.2  GLUCOSE 135*    Discharge Medications:  Allergies as of 03/18/2017      Reactions   Mupirocin Other (See Comments)   Burning, pain, swelling and sob   Codeine Nausea Only  Medication List    TAKE these medications   albuterol 108 (90 Base) MCG/ACT inhaler Commonly known as:  VENTOLIN HFA Inhale 2 puffs into the lungs every 6 (six) hours as needed for wheezing or shortness of breath.   ALPRAZolam 0.25 MG tablet Commonly known as:  XANAX TAKE 1 TABLET BY MOUTH TWICE A DAY AS NEEDED FOR ANXIETY. What changed:  See the new instructions.   aspirin 81 MG tablet Take 1 tablet (81 mg total) by mouth daily.   buPROPion 150 MG 12 hr tablet Commonly known as:  WELLBUTRIN SR Take 1 tablet (150 mg  total) by mouth daily for 3 days, THEN 1 tablet (150 mg total) 2 (two) times daily. Start taking on:  01/08/2017   carvedilol 3.125 MG tablet Commonly known as:  COREG Take 1 tablet (3.125 mg total) by mouth 2 (two) times daily.   celecoxib 100 MG capsule Commonly known as:  CELEBREX Take 100 mg by mouth 2 (two) times daily as needed for moderate pain.   chlorpheniramine-HYDROcodone 10-8 MG/5ML Suer Commonly known as:  TUSSIONEX PENNKINETIC ER Take 5 mLs by mouth every 12 (twelve) hours as needed for cough.   clopidogrel 75 MG tablet Commonly known as:  PLAVIX Take 1 tablet (75 mg total) by mouth daily. Resume on 03/23/17 What changed:  additional instructions   digoxin 0.125 MG tablet Commonly known as:  LANOXIN Take 1 tablet (0.125 mg total) by mouth daily.   furosemide 20 MG tablet Commonly known as:  LASIX Take 1 tablet (20 mg total) by mouth daily. What changed:    when to take this  additional instructions   ibuprofen 200 MG tablet Commonly known as:  ADVIL,MOTRIN Take 200 mg by mouth daily as needed for headache.   naproxen sodium 220 MG tablet Commonly known as:  ALEVE Take 220 mg by mouth daily as needed (for headaches.).   potassium chloride 10 MEQ tablet Commonly known as:  K-DUR Take 1 tablet (10 mEq total) daily by mouth. What changed:    when to take this  additional instructions   rosuvastatin 10 MG tablet Commonly known as:  CRESTOR TAKE 1 TABLET BY MOUTH EVERY DAY.   sacubitril-valsartan 24-26 MG Commonly known as:  ENTRESTO Take 1 tablet by mouth 2 (two) times daily.   traMADol 50 MG tablet Commonly known as:  ULTRAM Take 1 tablet (50 mg total) by mouth every 6 (six) hours as needed for moderate pain.       Disposition:  Discharge Instructions    Diet - low sodium heart healthy   Complete by:  As directed    Increase activity slowly   Complete by:  As directed      Follow-up Information    [facility] Follow  up on 04/01/2017.   Specialty:  Cardiology Why:  at [facility] information: [address]       Evans Lance, MD Follow up on 06/17/2017.   Specialty:  Cardiology Why:  at 9:15AM Contact information: [place]. [place] [address] [phone]           Duration of Discharge Encounter: Greater than 30 minutes including physician time.  Signed, Chanetta Marshall, NP 03/18/2017 7:40 AM  EP Attending  Patient seen and examined. Agree with the findings as above. She is stable for dc home. Her ICD interrogation done under my direction demonstrates normal device function. Her  cxr looks good. She will be discharged home with usual followup.  Mikle Bosworth.D.

## 2017-03-18 NOTE — Discharge Instructions (Signed)
° ° °  Supplemental Discharge Instructions for  Pacemaker/Defibrillator Patients  Activity No heavy lifting or vigorous activity with your left/right arm for 6 to 8 weeks.  Do not raise your left/right arm above your head for one week.  Gradually raise your affected arm as drawn below.           __       03/22/17                       03/23/17                       03/24/17                      03/25/17  NO DRIVING for   1 week  ; you may begin driving on  06/27/01   .  WOUND CARE - Keep the wound area clean and dry.  Do not get this area wet for one week. No showers for one week; you may shower on   03/25/17  . - The tape/steri-strips on your wound will fall off; do not pull them off.  No bandage is needed on the site.  DO  NOT apply any creams, oils, or ointments to the wound area. - If you notice any drainage or discharge from the wound, any swelling or bruising at the site, or you develop a fever > 101? F after you are discharged home, call the office at once.  Special Instructions - You are still able to use cellular telephones; use the ear opposite the side where you have your pacemaker/defibrillator.  Avoid carrying your cellular phone near your device. - When traveling through airports, show security personnel your identification card to avoid being screened in the metal detectors.  Ask the security personnel to use the hand wand. - Avoid arc welding equipment, MRI testing (magnetic resonance imaging), TENS units (transcutaneous nerve stimulators).  Call the office for questions about other devices. - Avoid electrical appliances that are in poor condition or are not properly grounded. - Microwave ovens are safe to be near or to operate.  Additional information for defibrillator patients should your device go off: - If your device goes off ONCE and you feel fine afterward, notify the device clinic nurses. - If your device goes off ONCE and you do not feel well afterward, call 911. - If your  device goes off TWICE, call 911. - If your device goes off THREE times in one day, call 911.  DO NOT DRIVE YOURSELF OR A FAMILY MEMBER WITH A DEFIBRILLATOR TO THE HOSPITAL--CALL 911.

## 2017-03-18 NOTE — Telephone Encounter (Signed)
Pt was on TCM report admitted/26/19 for Implantation of a BSX CRTD. There were no immediate post procedure complications. Pt D/C 03/18/17, and will f/u w/ cardiology on 04/01/17.Marland KitchenJohny Chess

## 2017-03-18 NOTE — Progress Notes (Signed)
The patient has been given discharge instructions along with a new medication list and what to take today. She has follow up appointments and medication to pick up. She has been educated on site care for her Biv ICD place and limitations. She is discharging with her husband via car.   Saddie Benders RN

## 2017-03-19 ENCOUNTER — Telehealth: Payer: Self-pay | Admitting: Internal Medicine

## 2017-03-19 NOTE — Telephone Encounter (Signed)
Informed pt that it was ok for her to sleep on her left side as long as it was comfortable and didn't cause any pain, pt voiced understanding.

## 2017-03-19 NOTE — Telephone Encounter (Signed)
New message      Patient wants to know if she is allowed to sleep on her left side? She is having trouble sleeping on her back and would like to sleep on her side

## 2017-03-20 ENCOUNTER — Telehealth: Payer: Self-pay | Admitting: Internal Medicine

## 2017-03-20 NOTE — Telephone Encounter (Signed)
°  New message  Pt verbalized that she is calling for RN  Pan medication is not working  Elbow is swollen and it hurts really badly

## 2017-03-20 NOTE — Telephone Encounter (Signed)
Kimberly [name] c/o swelling in left arm and fingers with some pain when elbow is bent. She denies any redness in arm or hand. I have advised her to take her arm out of the sling and prop up on pillows while in bed or in a chair. Encouraged her to use tramadol and tylenol as directed. She verbalizes understanding and is appreciative.

## 2017-03-20 NOTE — H&P (Signed)
ICD Criteria  Current LVEF:15%. Within 12 months prior to implant: Yes   Heart failure history: Yes, Class III  Cardiomyopathy history: Yes, Non-Ischemic Cardiomyopathy.  Atrial Fibrillation/Atrial Flutter: No.  Ventricular tachycardia history: No.  Cardiac arrest history: No.  History of syndromes with risk of sudden death: No.  Previous ICD: No.  Current ICD indication: Primary  PPM indication: No.   Class I or II Bradycardia indication present: No  Beta Blocker therapy for 3 or more months: Yes, prescribed.   Ace Inhibitor/ARB therapy for 3 or more months: Yes, prescribed.

## 2017-03-25 ENCOUNTER — Other Ambulatory Visit: Payer: Self-pay | Admitting: Internal Medicine

## 2017-03-25 ENCOUNTER — Telehealth: Payer: Self-pay | Admitting: Cardiology

## 2017-03-25 ENCOUNTER — Telehealth: Payer: Self-pay

## 2017-03-25 NOTE — Telephone Encounter (Signed)
Forwarding to device clinic for evaluation.

## 2017-03-25 NOTE — Telephone Encounter (Signed)
Addressed by [facility] nurse in a separate phone encounter.

## 2017-03-25 NOTE — Telephone Encounter (Signed)
Spoke with patient who repots back pain that began last night. She reports that it only hurts when she lays down and that she has been sleeping in a different bed than she is used to because she was told not to sleep with her dog-who sleeps in her bed with her husband She had difficulty describing the pain simply stating that it was a "dull" pain. She declined ShOB stating that she notices when she takes a big deep breath that it hurts her back. She expressed concern that this pain was coming from her bruising. She reports bruising on her right side that has not worsened since discharge. She states that she took her last tramadol last night at 1am and is requesting more to help with the back pain. I recommended that she take tylenol on a scheduled basis for a coupe of days to help with her pain while she continued to increase her ROM and mobility. Patient was agreeable to this plan. I confirmed wound check appt. With patient. Patient will call with any other concerns.

## 2017-03-25 NOTE — Telephone Encounter (Signed)
Ms Marasco called on called NP. She has recent PPM placement 03/17/17. She is complaining of really bad bruising and severe back and bilateral rib pain since yesterday. She also has some shortness of breath. Taking a deep breath makes her pain worse as well as movement and lying down in her bed. She had to sleep in her recliner since 1:30 am. She is concerned that she was not having this after her PM, just started yesterday. She has been trying to increase her activity and went shopping yesterday, but denies anything strenuous that would make her sore. She is out of her pain med.   She would like to be evaluated. I will route to Dr. Tanna Furry office as well as Dr. Tyrell Antonio office for triage.   She knows that if her shortness of breath increases she should go to the ED.   Daune Perch, AGNP-C [facility] 03/25/2017  7:44 AM

## 2017-03-27 ENCOUNTER — Telehealth: Payer: Self-pay | Admitting: Internal Medicine

## 2017-03-27 NOTE — Telephone Encounter (Signed)
Kimberly [name] is calling because she had a fever on late yesterday, and she is itching across her chest and wants to know can she put some Cortizone 10 on it and what should she do if she gets a temperature again . Please call

## 2017-03-27 NOTE — Telephone Encounter (Signed)
Spoke with Ms. Oberry she stated that she had a fever of 99.2 last night but this morning it was 98.0, she stated that the area around the incision site was not red, and the swelling had gone down since day of implant. She stated that where she was itching was around her neck and shoulder from where there was tape and it had been itching since she came home from the hospital, informed her to she could put hydrocortisone cream on her neck and shoulder but to keep any lotions or ointments away from the incision site, also recommend she take some benadryl for the itching. Informed her to call back if any redness developed or any more fevers she voiced understanding.

## 2017-04-01 ENCOUNTER — Ambulatory Visit (INDEPENDENT_AMBULATORY_CARE_PROVIDER_SITE_OTHER): Payer: Medicare Other | Admitting: *Deleted

## 2017-04-01 DIAGNOSIS — I5022 Chronic systolic (congestive) heart failure: Secondary | ICD-10-CM | POA: Diagnosis not present

## 2017-04-01 DIAGNOSIS — Z95810 Presence of automatic (implantable) cardiac defibrillator: Secondary | ICD-10-CM | POA: Diagnosis not present

## 2017-04-01 DIAGNOSIS — I472 Ventricular tachycardia, unspecified: Secondary | ICD-10-CM

## 2017-04-01 DIAGNOSIS — I420 Dilated cardiomyopathy: Secondary | ICD-10-CM | POA: Diagnosis not present

## 2017-04-01 LAB — CUP PACEART INCLINIC DEVICE CHECK
Brady Statistic RA Percent Paced: 3 %
Brady Statistic RV Percent Paced: 76 %
Date Time Interrogation Session: 20190313040000
HighPow Impedance: 59 Ohm
Implantable Lead Implant Date: 20190226
Implantable Lead Implant Date: 20190226
Implantable Lead Implant Date: 20190226
Implantable Lead Location: 753860
Implantable Lead Model: 7740
Implantable Lead Serial Number: 444536
Lead Channel Impedance Value: 569 Ohm
Lead Channel Pacing Threshold Amplitude: 1.3 V
Lead Channel Pacing Threshold Pulse Width: 0.4 ms
Lead Channel Sensing Intrinsic Amplitude: 14.6 mV
Lead Channel Sensing Intrinsic Amplitude: 4.5 mV
Lead Channel Setting Pacing Amplitude: 3.5 V
Lead Channel Setting Pacing Pulse Width: 0.4 ms
Lead Channel Setting Pacing Pulse Width: 0.4 ms
Lead Channel Setting Sensing Sensitivity: 1 mV
MDC IDC LEAD LOCATION: 753858
MDC IDC LEAD LOCATION: 753859
MDC IDC LEAD SERIAL: 726363
MDC IDC LEAD SERIAL: 805938
MDC IDC MSMT LEADCHNL LV IMPEDANCE VALUE: 816 Ohm
MDC IDC MSMT LEADCHNL LV PACING THRESHOLD PULSEWIDTH: 0.4 ms
MDC IDC MSMT LEADCHNL RA PACING THRESHOLD AMPLITUDE: 0.8 V
MDC IDC MSMT LEADCHNL RA PACING THRESHOLD PULSEWIDTH: 0.4 ms
MDC IDC MSMT LEADCHNL RV IMPEDANCE VALUE: 423 Ohm
MDC IDC MSMT LEADCHNL RV PACING THRESHOLD AMPLITUDE: 0.8 V
MDC IDC MSMT LEADCHNL RV SENSING INTR AMPL: 14.6 mV
MDC IDC PG IMPLANT DT: 20190226
MDC IDC SET LEADCHNL LV PACING AMPLITUDE: 3.5 V
MDC IDC SET LEADCHNL RV PACING AMPLITUDE: 3.5 V
MDC IDC SET LEADCHNL RV SENSING SENSITIVITY: 0.5 mV
Pulse Gen Serial Number: 204176

## 2017-04-01 NOTE — Patient Instructions (Addendum)
Hold (do not take) aspirin and Plavix for 3 days (Thursday, Friday, Saturday).  Resume taking on Sunday.  Call the [facility] if you note any signs/symptoms of infection, including redness, swelling, drainage, fever/chills.  Return to the [facility] on 04/06/17 at 4:00pm.

## 2017-04-01 NOTE — Progress Notes (Addendum)
Wound check appointment. Steri-strips removed from R and L chest incisions. Wound without redness. Small hematoma noted over device site (R chest), soft to palpation, extensive ecchymoses in various stages of healing also noted. Both R and L incision edges approximated and healing well. Patient reports that hematoma has increased in size since she left the hospital. Afebrile today. Received verbal recommendations from [place] to hold ASA and Plavix x3 days, then reassess in [facility] next week. Patient and husband verbalize understanding of instructions and are aware to call with any signs/symptoms of infection.  Patient gave verbal permission to include wound photo (see below).  Normal device function. Thresholds, sensing, and impedances consistent with implant measurements. Device programmed at 3.5V for extra safety margin until 3 month visit. Histogram distribution appropriate for patient and level of activity. No mode switches or ventricular arrhythmias noted. BiV pacing 76%, 259.5K PVCs since implant. Patient educated about wound care, arm mobility, lifting restrictions, shock plan, and Latitude monitor. ROV with Device Clinic on 04/06/17 and ROV with GT on 06/17/17.

## 2017-04-02 ENCOUNTER — Telehealth: Payer: Self-pay | Admitting: *Deleted

## 2017-04-02 NOTE — Telephone Encounter (Signed)
Small hematoma noted at R chest device pocket on 3/13, s/p CRT-D implant on 2/26.  Received recommendations from Dr. Fletcher Anon via Dr. Caryl Comes that patient may HOLD ASA 81mg  x3 days as instructed at wound check appointment on 04/01/17, but plan to STOP Plavix.  On Sunday, 04/05/17, patient will plan to resume taking ASA 81mg  only.  Patient verbalizes understanding of instructions.  Med list updated.  Patient is aware of wound recheck appointment on 04/06/17.  She denies additional questions or concerns at this time.  Message routed to Dr. Fletcher Anon as an Juluis Rainier.

## 2017-04-06 ENCOUNTER — Ambulatory Visit (INDEPENDENT_AMBULATORY_CARE_PROVIDER_SITE_OTHER): Payer: Self-pay | Admitting: *Deleted

## 2017-04-06 ENCOUNTER — Encounter (HOSPITAL_COMMUNITY): Payer: Self-pay | Admitting: *Deleted

## 2017-04-06 DIAGNOSIS — I5022 Chronic systolic (congestive) heart failure: Secondary | ICD-10-CM

## 2017-04-06 NOTE — Progress Notes (Signed)
Wound re-check hematoma smaller in size with less bruising than previous. Small opening on right lateral incision site, site assessed by GT and superficially debrided by GT, antibiotic ointment applied and covered with bandage. Pt instructed to to leave bandage on and not get wet until 04/08/2017 then shower as normal with soap and water, ROV for wound re-check 04/15/17

## 2017-04-06 NOTE — Patient Instructions (Signed)
Leave bandage on incision site until 04/08/2017. Do not get bandage wet. May shower normal on 04/08/2017 with soap and water. No lotions, oils, ointments, powders on incision site. Return for wound re-check 04/15/2017 at 3:30pm

## 2017-04-09 ENCOUNTER — Telehealth: Payer: Self-pay | Admitting: Internal Medicine

## 2017-04-09 NOTE — Telephone Encounter (Signed)
Spoke with [name] she stated there was a drop of blood about the size of pea on the bandage and when she got out of the shower there was another drop, informed Kimberly [name] to keep a clean dressing on site until wound check with Dr. Lovena Le in the office on 04/15/17. Pt voiced understanding.

## 2017-04-09 NOTE — Telephone Encounter (Signed)
°  1. Has your device fired? no  2. Is you device beeping? no  3. Are you experiencing draining or swelling at device site? A little bit of blood   4. Are you calling to see if we received your device transmission? no  5. Have you passed out? no  Patient states that she was here on Monday and Dr. Lovena Le put a drop of medicine on stitch and put on a patch on it. Patient states that she checked stitch and there was a little bit of blood on the patch and also a little bit of blood on patch after her shower.   Please route to [place]

## 2017-04-15 ENCOUNTER — Ambulatory Visit (INDEPENDENT_AMBULATORY_CARE_PROVIDER_SITE_OTHER): Payer: Self-pay | Admitting: *Deleted

## 2017-04-15 DIAGNOSIS — I420 Dilated cardiomyopathy: Secondary | ICD-10-CM

## 2017-04-15 DIAGNOSIS — Z95810 Presence of automatic (implantable) cardiac defibrillator: Secondary | ICD-10-CM

## 2017-04-15 LAB — CUP PACEART INCLINIC DEVICE CHECK
Brady Statistic RV Percent Paced: 75 %
Implantable Lead Location: 753858
Implantable Lead Location: 753859
Implantable Lead Model: 292
Implantable Lead Model: 4671
Implantable Lead Serial Number: 726363
Implantable Lead Serial Number: 805938
Implantable Pulse Generator Implant Date: 20190226
Lead Channel Setting Pacing Amplitude: 3.5 V
Lead Channel Setting Pacing Amplitude: 3.5 V
Lead Channel Setting Sensing Sensitivity: 0.5 mV
MDC IDC LEAD IMPLANT DT: 20190226
MDC IDC LEAD IMPLANT DT: 20190226
MDC IDC LEAD IMPLANT DT: 20190226
MDC IDC LEAD LOCATION: 753860
MDC IDC LEAD SERIAL: 444536
MDC IDC SESS DTM: 20190327040000
MDC IDC SET LEADCHNL LV PACING PULSEWIDTH: 0.4 ms
MDC IDC SET LEADCHNL LV SENSING SENSITIVITY: 1 mV
MDC IDC SET LEADCHNL RA PACING AMPLITUDE: 3.5 V
MDC IDC SET LEADCHNL RV PACING PULSEWIDTH: 0.4 ms
MDC IDC STAT BRADY RA PERCENT PACED: 6 %
Pulse Gen Serial Number: 204176

## 2017-04-15 NOTE — Patient Instructions (Addendum)
Gently wash your incision twice daily with antibacterial (Dial) soap, water, and a clean washcloth.  Call our office if you develop any signs/symptoms of infection (such as drainage, redness, swelling, or fever/chills).  Keep site open to air unless it is draining.

## 2017-04-15 NOTE — Progress Notes (Signed)
Wound recheck in clinic. Stitch noted along medial incision, scab in place at right lateral corner. No redness, swelling, or drainage noted. Dr. Lovena Le assessed site, superficially debrided right corner, and removed stitch from medial incision. Sites left open to air per Dr. Lovena Le. Patient instructed to wash incision twice daily with antibacterial soap, water, and a clean washcloth. Patient to monitor for signs/symptoms of infection and call if any noted. Plan for wound recheck next week while Dr. Lovena Le is in the office.  Device interrogated for episodes. No testing performed. BiV pacing 75%, reviewed with GT--likely due to PVCs per histograms. No episodes. No changes at this time. ROV with Device Clinic on 04/17/17.

## 2017-04-17 ENCOUNTER — Encounter: Payer: Medicare Other | Admitting: Physician Assistant

## 2017-04-20 ENCOUNTER — Telehealth: Payer: Self-pay | Admitting: Internal Medicine

## 2017-04-20 NOTE — Telephone Encounter (Signed)
New Message:    Pt would like to know when she comes to her appt tomorrow could the Dr give her something for the head cold she currently has.

## 2017-04-20 NOTE — Telephone Encounter (Signed)
Returned call to Pt.  Notified she would need to call her PCP for head cold.  Pt indicates understanding.

## 2017-04-21 ENCOUNTER — Ambulatory Visit (INDEPENDENT_AMBULATORY_CARE_PROVIDER_SITE_OTHER): Payer: Medicare Other | Admitting: Family Medicine

## 2017-04-21 ENCOUNTER — Encounter: Payer: Self-pay | Admitting: Family Medicine

## 2017-04-21 ENCOUNTER — Ambulatory Visit (INDEPENDENT_AMBULATORY_CARE_PROVIDER_SITE_OTHER): Payer: Self-pay | Admitting: *Deleted

## 2017-04-21 VITALS — BP 98/64 | HR 47 | Temp 97.8°F | Ht 61.0 in | Wt 113.0 lb

## 2017-04-21 DIAGNOSIS — J069 Acute upper respiratory infection, unspecified: Secondary | ICD-10-CM | POA: Diagnosis not present

## 2017-04-21 DIAGNOSIS — I420 Dilated cardiomyopathy: Secondary | ICD-10-CM

## 2017-04-21 MED ORDER — AMOXICILLIN 500 MG PO CAPS
500.0000 mg | ORAL_CAPSULE | Freq: Two times a day (BID) | ORAL | 0 refills | Status: DC
Start: 2017-04-21 — End: 2017-07-28

## 2017-04-21 NOTE — Patient Instructions (Signed)
Vick's and delsym can help with a cough   Honey can help with a sore throat.   Try using a humidifier.

## 2017-04-21 NOTE — Progress Notes (Signed)
Kimberly [name] - 72 y.o. female MRN [id number]  Date of birth: May 06, 1945  SUBJECTIVE:  Including CC & ROS.  Chief Complaint  Patient presents with  . Cough    Kimberly [name] is a 72 y.o. female that is presenting with a cough and congestion. Ongoing for one week. Admits to productive cough with green mucous. Denies fevers or body aches. She has been taking Tussionex with some improvement. She had a pacemaker and defibrillator placed on 03/17/17.    Review of Systems  Constitutional: Negative for fever.  HENT: Positive for congestion and sinus pressure.   Respiratory: Positive for cough.     HISTORY: Past Medical, Surgical, Social, and Family History Reviewed & Updated per EMR.   Pertinent Historical Findings include:  Past Medical History:  Diagnosis Date  . AICD (automatic cardioverter/defibrillator) present 03/17/2017  . Anxiety   . Atrial tachycardia ([place])   . Bursitis of shoulder, right, adhesive   . CHF (congestive heart failure) ([place])   . Chronic bronchitis ([place])    "1-2 times/yr" (01/23/2014)  . COPD (chronic obstructive pulmonary disease) ([place])   . CVD (cerebrovascular disease)   . Dyslipidemia   . Dysrhythmia   . Frequency of urination   . GERD (gastroesophageal reflux disease)   . Heart murmur   . History of stomach ulcers   . HTN (hypertension) 02/22/2011  . Migraines    "stopped many years ago" (06/14/2014)  . Osteoporosis 08/19/2016  . Pericarditis   . Pneumonia "10 times" (06/14/2014)  . 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 Silent myocardial infarction ([place]) "late 1990's"  . Stress incontinence    "was suppose to have been tacked up years ago but I didn't do it"  . Syncope, near    Associated with atrial tachycardia-event recorder 1/16  . Thoracic outlet syndrome   . VSD (ventricular septal defect)     Past Surgical History:  Procedure Laterality Date  . ABDOMINAL AORTOGRAM W/LOWER EXTREMITY N/A 12/17/2016   Procedure: ABDOMINAL AORTOGRAM W/LOWER EXTREMITY;  Surgeon: Wellington Hampshire, MD;  Location: [facility];  Service: Cardiovascular;  Laterality: N/A;  . BIV ICD INSERTION CRT-D N/A 03/17/2017   Procedure: BIV ICD INSERTION CRT-D;  Surgeon: Evans Lance, MD;  Location: [facility];  Service: Cardiovascular;  Laterality: N/A;  . CARDIAC CATHETERIZATION  "quite a few"  . [place]  . CHOLECYSTECTOMY OPEN  1970's  . CORONARY ANGIOGRAM  2000   No significant CAD  . ELECTROPHYSIOLOGIC STUDY N/A 06/14/2014   Procedure: A-Flutter/A-Tach/SVT Ablation;  Surgeon: Evans Lance, MD;  Location: [facility];  Service: Cardiovascular;  Laterality: N/A;  . INSERTION OF ICD  03/17/2017   BIV  . MYRINGOTOMY WITH TUBE PLACEMENT Right 2015  . PERIPHERAL VASCULAR INTERVENTION  12/17/2016   Procedure: PERIPHERAL VASCULAR INTERVENTION;  Surgeon: Wellington Hampshire, MD;  Location: [facility];  Service: Cardiovascular;;  Right common femoral PTA and Stent  . RIGHT/LEFT HEART CATH AND CORONARY ANGIOGRAPHY Bilateral 11/06/2016   Procedure: RIGHT/LEFT HEART CATH AND CORONARY ANGIOGRAPHY;  Surgeon: Wellington Hampshire, MD;  Location: [facility];  Service: Cardiovascular;  Laterality: Bilateral;  . SVT ABLATION  06/14/2014  . TUBAL LIGATION  1972  . VSD REPAIR  1958; 1967    Allergies  Allergen Reactions  . Mupirocin Other (See Comments)    Burning, pain, swelling and sob  . Codeine Nausea Only  Family History  Problem Relation Age of Onset  . Cancer Mother   . Cancer Father   . Throat cancer Unknown   . Hypertension Unknown   . Stroke Unknown   . Alcohol abuse Other   . Arthritis Other   . Cancer Other        lung cancer  . Hypertension Other   . Arthritis Other   . Stroke Other   . Breast cancer Neg Hx      Social History   Socioeconomic History  . Marital status: Married    Spouse name: Not on file  . Number of children: Not on file  .  Years of education: 81  . Highest education level: Not on file  Occupational History  . Occupation: retired  Scientific laboratory technician  . Financial resource strain: Not on file  . Food insecurity:    Worry: Not on file    Inability: Not on file  . Transportation needs:    Medical: Not on file    Non-medical: Not on file  Tobacco Use  . Smoking status: Current Every Day Smoker    Packs/day: 0.33    Years: 35.00    Pack years: 11.55    Types: Cigarettes  . Smokeless tobacco: Never Used  . Tobacco comment: Smoked 1971-present , up to 1/2 ppd. Intermittent smoking cessation with preganancy up to 1/2-1 year @ a time  Substance and Sexual Activity  . Alcohol use: Yes    Comment: 06/14/2014 "glass of wine maybe once/month"  . Drug use: No  . Sexual activity: Never  Lifestyle  . Physical activity:    Days per week: Not on file    Minutes per session: Not on file  . Stress: Not on file  Relationships  . Social connections:    Talks on phone: Not on file    Gets together: Not on file    Attends religious service: Not on file    Active member of club or organization: Not on file    Attends meetings of clubs or organizations: Not on file    Relationship status: Not on file  . Intimate partner violence:    Fear of current or ex partner: Not on file    Emotionally abused: Not on file    Physically abused: Not on file    Forced sexual activity: Not on file  Other Topics Concern  . Not on file  Social History Narrative   Pt lives with husband.     PHYSICAL EXAM:  VS: BP 98/64 (BP Location: Left Arm, Patient Position: Sitting, Cuff Size: Normal)   Pulse (!) 47   Temp 97.8 F (36.6 C) (Oral)   Ht 5\' 1"  (1.549 m)   Wt 113 lb (51.3 kg)   SpO2 100%   BMI 21.35 kg/m  Physical Exam Gen: NAD, alert, cooperative with exam,  ENT: normal lips, normal nasal mucosa, tympanic membranes clear and intact bilaterally, normal oropharynx,  Eye: normal EOM, normal conjunctiva and lids CV:  no edema, +2  pedal pulses, regular rhythm, S1-S2   Resp: no accessory muscle use, non-labored, clear to auscultation bilaterally, no crackles or wheezes Skin: no rashes, no areas of induration  Neuro: normal tone, normal sensation to touch Psych:  normal insight, alert and oriented MSK: Normal gait, normal strength       ASSESSMENT & PLAN:   Upper respiratory tract infection Likely more viral in nature.  - printed amoxicillin  - counseled on supportive care - given  indications to follow up

## 2017-04-21 NOTE — Progress Notes (Signed)
Wound re-check, scab noted at right corner on incision site, assessed by GT per recommendations pt to continue to wash site daily with antibacterial soap and pat dry and to f/u as scheduled in May. Pt voiced understanding of instructions.

## 2017-04-22 DIAGNOSIS — J069 Acute upper respiratory infection, unspecified: Secondary | ICD-10-CM | POA: Insufficient documentation

## 2017-04-22 NOTE — Assessment & Plan Note (Signed)
Likely more viral in nature.  - printed amoxicillin  - counseled on supportive care - given indications to follow up

## 2017-04-28 ENCOUNTER — Ambulatory Visit: Payer: Medicare Other

## 2017-05-04 ENCOUNTER — Telehealth: Payer: Self-pay | Admitting: Internal Medicine

## 2017-05-04 NOTE — Telephone Encounter (Signed)
Review for refill for Tussionex Pennkin[place] ER 10-8 mg/5 ml   LOV  04/21/17  Provider:  Cathlean Cower, MD  Last refilled on 03/02/17  Pharmacy;  [facility]

## 2017-05-04 NOTE — Telephone Encounter (Signed)
Copied from [place]. Topic: Quick Communication - Rx Refill/Question >> May 04, 2017  9:22 AM Ahmed Prima L wrote: Medication: chlorpheniramine-HYDROcodone ([facility]) 10-8 MG/5ML SUER Has the patient contacted their pharmacy? yes (Agent: If no, request that the patient contact the pharmacy for the refill.) Preferred Pharmacy (with phone number or street name): [place], [place] - [address] Agent: Please be advised that RX refills may take up to 3 business days. We ask that you follow-up with your pharmacy.

## 2017-05-05 MED ORDER — HYDROCOD POLST-CPM POLST ER 10-8 MG/5ML PO SUER: 5.0000 mL | Freq: Two times a day (BID) | ORAL | 0 refills | Status: DC | PRN

## 2017-05-05 NOTE — Telephone Encounter (Signed)
Done erx 

## 2017-05-18 ENCOUNTER — Encounter: Payer: Self-pay | Admitting: Podiatry

## 2017-05-18 ENCOUNTER — Ambulatory Visit: Payer: Medicare Other | Admitting: Podiatry

## 2017-05-18 ENCOUNTER — Encounter (HOSPITAL_COMMUNITY): Payer: Medicare Other | Admitting: Cardiology

## 2017-05-18 ENCOUNTER — Ambulatory Visit (INDEPENDENT_AMBULATORY_CARE_PROVIDER_SITE_OTHER): Payer: Medicare Other

## 2017-05-18 VITALS — BP 120/78 | HR 69 | Resp 16

## 2017-05-18 DIAGNOSIS — M778 Other enthesopathies, not elsewhere classified: Secondary | ICD-10-CM

## 2017-05-18 DIAGNOSIS — M779 Enthesopathy, unspecified: Secondary | ICD-10-CM | POA: Diagnosis not present

## 2017-05-18 DIAGNOSIS — M109 Gout, unspecified: Secondary | ICD-10-CM | POA: Diagnosis not present

## 2017-05-18 MED ORDER — COLCHICINE 0.6 MG PO TABS: ORAL_TABLET | ORAL | 3 refills | Status: DC

## 2017-05-18 NOTE — Progress Notes (Signed)
Subjective:  Patient ID: Kimberly [name], female    DOB: February 02, 1945,  MRN: [id number] HPI Chief Complaint  Patient presents with  . Foot Pain    Forefoot right - extremely tender, redness and swelling x 2-3 days, no injury, thinks may be a bug bite, history of staph infection and cardiac cath in right leg, also few days post op pacemaker  . New Patient (Initial Visit)    72 y.o. female presents with the above complaint.   ROS: Denies fever chills nausea vomiting muscle aches pains itching or rashes.  Past Medical History:  Diagnosis Date  . AICD (automatic cardioverter/defibrillator) present 03/17/2017  . Anxiety   . Atrial tachycardia ([place])   . Bursitis of shoulder, right, adhesive   . CHF (congestive heart failure) ([place])   . Chronic bronchitis ([place])    "1-2 times/yr" (01/23/2014)  . COPD (chronic obstructive pulmonary disease) ([place])   . CVD (cerebrovascular disease)   . Dyslipidemia   . Dysrhythmia   . Frequency of urination   . GERD (gastroesophageal reflux disease)   . Heart murmur   . History of stomach ulcers   . HTN (hypertension) 02/22/2011  . Migraines    "stopped many years ago" (06/14/2014)  . Osteoporosis 08/19/2016  . Pericarditis   . Pneumonia "10 times" (06/14/2014)  . 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 Silent myocardial infarction ([place]) "late 1990's"  . Stress incontinence    "was suppose to have been tacked up years ago but I didn't do it"  . Syncope, near    Associated with atrial tachycardia-event recorder 1/16  . Thoracic outlet syndrome   . VSD (ventricular septal defect)    Past Surgical History:  Procedure Laterality Date  . ABDOMINAL AORTOGRAM W/LOWER EXTREMITY N/A 12/17/2016   Procedure: ABDOMINAL AORTOGRAM W/LOWER EXTREMITY;  Surgeon: Wellington Hampshire, MD;  Location: [facility];  Service: Cardiovascular;  Laterality: N/A;  . BIV ICD INSERTION CRT-D N/A 03/17/2017   Procedure: BIV ICD INSERTION  CRT-D;  Surgeon: Evans Lance, MD;  Location: [facility];  Service: Cardiovascular;  Laterality: N/A;  . CARDIAC CATHETERIZATION  "quite a few"  . [place]  . CHOLECYSTECTOMY OPEN  1970's  . CORONARY ANGIOGRAM  2000   No significant CAD  . ELECTROPHYSIOLOGIC STUDY N/A 06/14/2014   Procedure: A-Flutter/A-Tach/SVT Ablation;  Surgeon: Evans Lance, MD;  Location: [place] [facility];  Service: Cardiovascular;  Laterality: N/A;  . INSERTION OF ICD  03/17/2017   BIV  . MYRINGOTOMY WITH TUBE PLACEMENT Right 2015  . PERIPHERAL VASCULAR INTERVENTION  12/17/2016   Procedure: PERIPHERAL VASCULAR INTERVENTION;  Surgeon: Wellington Hampshire, MD;  Location: [facility];  Service: Cardiovascular;;  Right common femoral PTA and Stent  . RIGHT/LEFT HEART CATH AND CORONARY ANGIOGRAPHY Bilateral 11/06/2016   Procedure: RIGHT/LEFT HEART CATH AND CORONARY ANGIOGRAPHY;  Surgeon: Wellington Hampshire, MD;  Location: [facility];  Service: Cardiovascular;  Laterality: Bilateral;  . SVT ABLATION  06/14/2014  . TUBAL LIGATION  1972  . VSD REPAIR  1958; 1967    Current Outpatient Medications:  .  albuterol (VENTOLIN HFA) 108 (90 Base) MCG/ACT inhaler, Inhale 2 puffs into the lungs every 6 (six) hours as needed for wheezing or shortness of breath., Disp: 18 g, Rfl: 11 .  ALPRAZolam (XANAX) 0.25 MG tablet, TAKE 1 TABLET BY MOUTH TWICE A DAY AS NEEDED FOR ANXIETY. (Patient taking differently: TAKE 1  TABLET BY MOUTH TWICE A DAY AS NEEDED FOR ANXIETY. (TYPICALLY AT BEDTIME)), Disp: 60 tablet, Rfl: 2 .  amoxicillin (AMOXIL) 500 MG capsule, Take 1 capsule (500 mg total) by mouth 2 (two) times daily. For 7 days., Disp: 14 capsule, Rfl: 0 .  aspirin 81 MG tablet, Take 1 tablet (81 mg total) by mouth daily., Disp: 100 tablet, Rfl: 99 .  buPROPion (WELLBUTRIN SR) 150 MG 12 hr tablet, Take 1 tablet (150 mg total) by mouth daily for 3 days, THEN 1 tablet (150 mg total) 2 (two) times daily.,  Disp: 60 tablet, Rfl: 3 .  carvedilol (COREG) 3.125 MG tablet, Take 1 tablet (3.125 mg total) by mouth 2 (two) times daily., Disp: 180 tablet, Rfl: 1 .  celecoxib (CELEBREX) 100 MG capsule, Take 100 mg by mouth 2 (two) times daily as needed for moderate pain., Disp: , Rfl:  .  chlorpheniramine-HYDROcodone (TUSSIONEX PENNKINETIC ER) 10-8 MG/5ML SUER, Take 5 mLs by mouth every 12 (twelve) hours as needed for cough., Disp: 140 mL, Rfl: 0 .  colchicine 0.6 MG tablet, Take 1 tablet TID until GI upset or flare subsides, then for maintenance, take 1 tablet daily., Disp: 90 tablet, Rfl: 3 .  digoxin (LANOXIN) 0.125 MG tablet, Take 1 tablet (0.125 mg total) by mouth daily., Disp: 90 tablet, Rfl: 3 .  furosemide (LASIX) 20 MG tablet, Take 1 tablet (20 mg total) by mouth daily. (Patient taking differently: Take 20 mg by mouth every other day. IN THE MORNING.), Disp: 30 tablet, Rfl: 3 .  ibuprofen (ADVIL,MOTRIN) 200 MG tablet, Take 200 mg by mouth daily as needed for headache., Disp: , Rfl:  .  naproxen sodium (ALEVE) 220 MG tablet, Take 220 mg by mouth daily as needed (for headaches.)., Disp: , Rfl:  .  potassium chloride (K-DUR) 10 MEQ tablet, Take 1 tablet (10 mEq total) daily by mouth. (Patient taking differently: Take 10 mEq by mouth every other day. IN THE MORNING.), Disp: 90 tablet, Rfl: 3 .  rosuvastatin (CRESTOR) 10 MG tablet, TAKE 1 TABLET BY MOUTH EVERY DAY, Disp: 90 tablet, Rfl: 1 .  sacubitril-valsartan (ENTRESTO) 24-26 MG, Take 1 tablet by mouth 2 (two) times daily., Disp: 60 tablet, Rfl: 3 .  traMADol (ULTRAM) 50 MG tablet, Take 1 tablet (50 mg total) by mouth every 6 (six) hours as needed for moderate pain., Disp: 10 tablet, Rfl: 0  Allergies  Allergen Reactions  . Mupirocin Other (See Comments)    Burning, pain, swelling and sob  . Codeine Nausea Only   Review of Systems Objective:   Vitals:   05/18/17 1137  BP: 120/78  Pulse: 69  Resp: 16    General: Well developed, nourished, in  no acute distress, alert and oriented x3   Dermatological: Skin is warm, dry and supple bilateral. Nails x 10 are well maintained; remaining integument appears unremarkable at this time. There are no open sores, no preulcerative lesions, no rash or signs of infection present.  Vascular: Dorsalis Pedis artery and Posterior Tibial artery pedal pulses are 2/4 bilateral with immedate capillary fill time. Pedal hair growth present. No varicosities and no lower extremity edema present bilateral.   Neruologic: Grossly intact via light touch bilateral. Vibratory intact via tuning fork bilateral. Protective threshold with Semmes Wienstein monofilament intact to all pedal sites bilateral. Patellar and Achilles deep tendon reflexes 2+ bilateral. No Babinski or clonus noted bilateral.   Musculoskeletal: No gross boney pedal deformities bilateral. No pain, crepitus, or limitation noted with foot  and ankle range of motion bilateral. Muscular strength 5/5 in all groups tested bilateral.  Mild swelling around the first metatarsal phalangeal joint which is tender on range of motion.  It is minimally warm to the touch majority the pain is dorsal medial she does have plantar pain as well.  She has good range of motion of the toe actively as well as passively that is moderately tender.  She has no pain on palpation of the metatarsals first through 5.  Mild erythema surrounding the joint but with the absence of fever and chills no ascending cellulitis this does not appear to be a septic joint.  She states that is already starting to resolve.  Gait: Unassisted but antalgic.   Radiographs:  No acute findings are noted though she does have some swelling around the first metatarsophalangeal joint of the right foot.  Assessment & Plan:   Assessment: Probable gouty capsulitis of the first metatarsophalangeal joint right foot.  See no signs of cellulitis.  Plan: At this point I instructed her to continue her Celebrex that  she is already on.  I also wrote a prescription for colchicine.  She will follow-up with me if this does not resolve in the next few weeks.  I did place her in a Darco shoe.  She will wear this until this episode is over should she develop fever or chills she is to notify us immediately or notify the emergency department.  Should this become more painful she will notify us immediately.  Should she have a call the office relating similar symptoms she is to have an stat uric acid.  She will then follow-up with me or Dr. Amalia Hailey as soon as possible.    Byron Tipping T. [place], [place]

## 2017-05-20 ENCOUNTER — Telehealth: Payer: Self-pay | Admitting: Podiatry

## 2017-05-20 NOTE — Telephone Encounter (Signed)
I saw Dr. Milinda Pointer on Monday and I have the gout in my right toe. He gave me that medicine, pill to take 3 times a day. Well, I've lost 2 lbs since Monday and I got sick and threw up a little while ago. I was just wondering if I should cut it back to 1 a day? So if you would let me know, I would appreciate it. My number is [phone]. Thank you.

## 2017-05-20 NOTE — Telephone Encounter (Signed)
Returned patient call, she stated she has been taking three tabs of Colchicine daily, but this morning she had vomiting.  I informed her to go ahead and only take one daily for now to see if the n/v subsides.  I informed her that if symptoms get worse to call back.  She verbally understood directions.

## 2017-06-17 ENCOUNTER — Ambulatory Visit (INDEPENDENT_AMBULATORY_CARE_PROVIDER_SITE_OTHER): Payer: Medicare Other | Admitting: Internal Medicine

## 2017-06-17 ENCOUNTER — Encounter: Payer: Self-pay | Admitting: Internal Medicine

## 2017-06-17 VITALS — BP 100/66 | HR 70 | Ht 61.0 in | Wt 109.0 lb

## 2017-06-17 DIAGNOSIS — I5022 Chronic systolic (congestive) heart failure: Secondary | ICD-10-CM | POA: Diagnosis not present

## 2017-06-17 DIAGNOSIS — I472 Ventricular tachycardia, unspecified: Secondary | ICD-10-CM

## 2017-06-17 DIAGNOSIS — I420 Dilated cardiomyopathy: Secondary | ICD-10-CM | POA: Diagnosis not present

## 2017-06-17 DIAGNOSIS — Z95810 Presence of automatic (implantable) cardiac defibrillator: Secondary | ICD-10-CM | POA: Diagnosis not present

## 2017-06-17 LAB — CUP PACEART INCLINIC DEVICE CHECK
Date Time Interrogation Session: 20190529040000
HighPow Impedance: 74 Ohm
Implantable Lead Implant Date: 20190226
Implantable Lead Location: 753858
Implantable Lead Model: 292
Implantable Lead Model: 7740
Implantable Lead Serial Number: 726363
Implantable Lead Serial Number: 805938
Lead Channel Impedance Value: 452 Ohm
Lead Channel Impedance Value: 787 Ohm
Lead Channel Pacing Threshold Amplitude: 0.5 V
Lead Channel Pacing Threshold Amplitude: 0.7 V
Lead Channel Pacing Threshold Amplitude: 1.6 V
Lead Channel Pacing Threshold Pulse Width: 0.4 ms
Lead Channel Pacing Threshold Pulse Width: 0.4 ms
Lead Channel Sensing Intrinsic Amplitude: 17.6 mV
Lead Channel Sensing Intrinsic Amplitude: 20 mV
Lead Channel Setting Pacing Amplitude: 2 V
Lead Channel Setting Pacing Amplitude: 2.5 V
Lead Channel Setting Sensing Sensitivity: 0.5 mV
Lead Channel Setting Sensing Sensitivity: 1 mV
MDC IDC LEAD IMPLANT DT: 20190226
MDC IDC LEAD IMPLANT DT: 20190226
MDC IDC LEAD LOCATION: 753859
MDC IDC LEAD LOCATION: 753860
MDC IDC LEAD SERIAL: 444536
MDC IDC MSMT LEADCHNL RA IMPEDANCE VALUE: 587 Ohm
MDC IDC MSMT LEADCHNL RA PACING THRESHOLD PULSEWIDTH: 0.4 ms
MDC IDC MSMT LEADCHNL RA SENSING INTR AMPL: 5.3 mV
MDC IDC PG IMPLANT DT: 20190226
MDC IDC SET LEADCHNL LV PACING AMPLITUDE: 2.6 V
MDC IDC SET LEADCHNL LV PACING PULSEWIDTH: 0.4 ms
MDC IDC SET LEADCHNL RV PACING PULSEWIDTH: 0.4 ms
MDC IDC STAT BRADY RA PERCENT PACED: 9 %
MDC IDC STAT BRADY RV PERCENT PACED: 83 %
Pulse Gen Serial Number: 204176

## 2017-06-17 MED ORDER — CARVEDILOL 3.125 MG PO TABS: 3.1250 mg | ORAL_TABLET | Freq: Two times a day (BID) | ORAL | 3 refills | Status: DC

## 2017-06-17 NOTE — Patient Instructions (Addendum)
Medication Instructions:  Your physician recommends that you continue on your current medications as directed. Please refer to the Current Medication list given to you today.  Labwork: None ordered.  Testing/Procedures: None ordered.  Follow-Up: Your physician wants you to follow-up in: 9 months with Dr. Lovena Le.   You will receive a reminder letter in the mail two months in advance. If you don't receive a letter, please call our office to schedule the follow-up appointment.  Remote monitoring is used to monitor your ICD from home. This monitoring reduces the number of office visits required to check your device to one time per year. It allows [place] to keep an eye on the functioning of your device to ensure it is working properly. You are scheduled for a device check from home on 09/16/2017. You may send your transmission at any time that day. If you have a wireless device, the transmission will be sent automatically. After your physician reviews your transmission, you will receive a postcard with your next transmission date.  Any Other Special Instructions Will Be Listed Below (If Applicable).  If you need a refill on your cardiac medications before your next appointment, please call your pharmacy.

## 2017-06-17 NOTE — Progress Notes (Signed)
HPI Kimberly [name] returns today for followup. She is a pleasant 72 yo woman with a h/o chronic systolic heart failure, atrial tachy, s/p ablation, frequent PVC and PAC's, and LBBB, s/p biv ICD. She was found to have a persistent Left SVC and her device was placed on the right side. LV lead was placed in the lateral vein. Allergies  Allergen Reactions  . Mupirocin Other (See Comments)    Burning, pain, swelling and sob  . Codeine Nausea Only     Current Outpatient Medications  Medication Sig Dispense Refill  . albuterol (VENTOLIN HFA) 108 (90 Base) MCG/ACT inhaler Inhale 2 puffs into the lungs every 6 (six) hours as needed for wheezing or shortness of breath. 18 g 11  . ALPRAZolam (XANAX) 0.25 MG tablet TAKE 1 TABLET BY MOUTH TWICE A DAY AS NEEDED FOR ANXIETY. (Patient taking differently: TAKE 1 TABLET BY MOUTH TWICE A DAY AS NEEDED FOR ANXIETY. (TYPICALLY AT BEDTIME)) 60 tablet 2  . amoxicillin (AMOXIL) 500 MG capsule Take 1 capsule (500 mg total) by mouth 2 (two) times daily. For 7 days. 14 capsule 0  . aspirin 81 MG tablet Take 1 tablet (81 mg total) by mouth daily. 100 tablet 99  . carvedilol (COREG) 3.125 MG tablet Take 1 tablet (3.125 mg total) by mouth 2 (two) times daily. 180 tablet 1  . celecoxib (CELEBREX) 100 MG capsule Take 100 mg by mouth 2 (two) times daily as needed for moderate pain.    . chlorpheniramine-HYDROcodone (TUSSIONEX PENNKINETIC ER) 10-8 MG/5ML SUER Take 5 mLs by mouth every 12 (twelve) hours as needed for cough. 140 mL 0  . colchicine 0.6 MG tablet Take 1 tablet TID until GI upset or flare subsides, then for maintenance, take 1 tablet daily. 90 tablet 3  . digoxin (LANOXIN) 0.125 MG tablet Take 1 tablet (0.125 mg total) by mouth daily. 90 tablet 3  . furosemide (LASIX) 20 MG tablet Take 1 tablet (20 mg total) by mouth daily. (Patient taking differently: Take 20 mg by mouth every other day. IN THE MORNING.) 30 tablet 3  . ibuprofen (ADVIL,MOTRIN) 200 MG tablet  Take 200 mg by mouth daily as needed for headache.    . naproxen sodium (ALEVE) 220 MG tablet Take 220 mg by mouth daily as needed (for headaches.).    Marland Kitchen potassium chloride (K-DUR) 10 MEQ tablet Take 1 tablet (10 mEq total) daily by mouth. (Patient taking differently: Take 10 mEq by mouth every other day. IN THE MORNING.) 90 tablet 3  . rosuvastatin (CRESTOR) 10 MG tablet TAKE 1 TABLET BY MOUTH EVERY DAY 90 tablet 1  . sacubitril-valsartan (ENTRESTO) 24-26 MG Take 1 tablet by mouth 2 (two) times daily. 60 tablet 3  . buPROPion (WELLBUTRIN SR) 150 MG 12 hr tablet Take 1 tablet (150 mg total) by mouth daily for 3 days, THEN 1 tablet (150 mg total) 2 (two) times daily. 60 tablet 3   No current facility-administered medications for this visit.      Past Medical History:  Diagnosis Date  . AICD (automatic cardioverter/defibrillator) present 03/17/2017  . Anxiety   . Atrial tachycardia ([place])   . Bursitis of shoulder, right, adhesive   . CHF (congestive heart failure) ([place])   . Chronic bronchitis ([place])    "1-2 times/yr" (01/23/2014)  . COPD (chronic obstructive pulmonary disease) ([place])   . CVD (cerebrovascular disease)   . Dyslipidemia   . Dysrhythmia   . Frequency of urination   .  GERD (gastroesophageal reflux disease)   . Heart murmur   . History of stomach ulcers   . HTN (hypertension) 02/22/2011  . Migraines    "stopped many years ago" (06/14/2014)  . Osteoporosis 08/19/2016  . Pericarditis   . Pneumonia "10 times" (06/14/2014)  . 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 Silent myocardial infarction ([place]) "late 1990's"  . Stress incontinence    "was suppose to have been tacked up years ago but I didn't do it"  . Syncope, near    Associated with atrial tachycardia-event recorder 1/16  . Thoracic outlet syndrome   . VSD (ventricular septal defect)     ROS:   All systems reviewed and negative except as noted in the HPI.   Past Surgical History:   Procedure Laterality Date  . ABDOMINAL AORTOGRAM W/LOWER EXTREMITY N/A 12/17/2016   Procedure: ABDOMINAL AORTOGRAM W/LOWER EXTREMITY;  Surgeon: Wellington Hampshire, MD;  Location: [facility];  Service: Cardiovascular;  Laterality: N/A;  . BIV ICD INSERTION CRT-D N/A 03/17/2017   Procedure: BIV ICD INSERTION CRT-D;  Surgeon: Evans Lance, MD;  Location: [facility];  Service: Cardiovascular;  Laterality: N/A;  . CARDIAC CATHETERIZATION  "quite a few"  . [place]  . CHOLECYSTECTOMY OPEN  1970's  . CORONARY ANGIOGRAM  2000   No significant CAD  . ELECTROPHYSIOLOGIC STUDY N/A 06/14/2014   Procedure: A-Flutter/A-Tach/SVT Ablation;  Surgeon: Evans Lance, MD;  Location: [facility];  Service: Cardiovascular;  Laterality: N/A;  . INSERTION OF ICD  03/17/2017   BIV  . MYRINGOTOMY WITH TUBE PLACEMENT Right 2015  . PERIPHERAL VASCULAR INTERVENTION  12/17/2016   Procedure: PERIPHERAL VASCULAR INTERVENTION;  Surgeon: Wellington Hampshire, MD;  Location: [facility];  Service: Cardiovascular;;  Right common femoral PTA and Stent  . RIGHT/LEFT HEART CATH AND CORONARY ANGIOGRAPHY Bilateral 11/06/2016   Procedure: RIGHT/LEFT HEART CATH AND CORONARY ANGIOGRAPHY;  Surgeon: Wellington Hampshire, MD;  Location: [facility];  Service: Cardiovascular;  Laterality: Bilateral;  . SVT ABLATION  06/14/2014  . TUBAL LIGATION  1972  . VSD REPAIR  1958; 78     Family History  Problem Relation Age of Onset  . Cancer Mother   . Cancer Father   . Throat cancer Unknown   . Hypertension Unknown   . Stroke Unknown   . Alcohol abuse Other   . Arthritis Other   . Cancer Other        lung cancer  . Hypertension Other   . Arthritis Other   . Stroke Other   . Breast cancer Neg Hx      Social History   Socioeconomic History  . Marital status: Married    Spouse name: Not on file  . Number of children: Not on file  . Years of education: 66  . Highest education  level: Not on file  Occupational History  . Occupation: retired  Scientific laboratory technician  . Financial resource strain: Not on file  . Food insecurity:    Worry: Not on file    Inability: Not on file  . Transportation needs:    Medical: Not on file    Non-medical: Not on file  Tobacco Use  . Smoking status: Current Every Day Smoker    Packs/day: 0.33    Years: 35.00    Pack years: 11.55    Types: Cigarettes  . Smokeless tobacco: Never Used  . Tobacco  comment: Smoked 1971-present , up to 1/2 ppd. Intermittent smoking cessation with preganancy up to 1/2-1 year @ a time  Substance and Sexual Activity  . Alcohol use: Yes    Comment: 06/14/2014 "glass of wine maybe once/month"  . Drug use: No  . Sexual activity: Never  Lifestyle  . Physical activity:    Days per week: Not on file    Minutes per session: Not on file  . Stress: Not on file  Relationships  . Social connections:    Talks on phone: Not on file    Gets together: Not on file    Attends religious service: Not on file    Active member of club or organization: Not on file    Attends meetings of clubs or organizations: Not on file    Relationship status: Not on file  . Intimate partner violence:    Fear of current or ex partner: Not on file    Emotionally abused: Not on file    Physically abused: Not on file    Forced sexual activity: Not on file  Other Topics Concern  . Not on file  Social History Narrative   Pt lives with husband.     BP 100/66   Pulse 70   Ht 5\' 1"  (1.549 m)   Wt 109 lb (49.4 kg)   BMI 20.60 kg/m   Physical Exam:  Well appearing NAD HEENT: Unremarkable Neck:  No JVD, no thyromegally Lymphatics:  No adenopathy Back:  No CVA tenderness Lungs:  Clear HEART:  Regular rate rhythm, no murmurs, no rubs, no clicks Abd:  soft, positive bowel sounds, no organomegally, no rebound, no guarding Ext:  2 plus pulses, no edema, no cyanosis, no clubbing Skin:  No rashes no nodules Neuro:  CN II through XII  intact, motor grossly intact  EKG - NSR with ventricular pacing and PVC's and PAC's  DEVICE  Normal device function.  See PaceArt for details.   Assess/Plan: 1. ICD - her [place]olson ICD is working normally. Will follow. 2. Chronic systolic heart failure - her symptoms are class 2. She will continue her current meds.  3. Atrial tachy - she has had none since her ablation. She still has PAC's 4. Coags - she is back on her eliquis and has had no bleeding.  Mikle Bosworth.D.

## 2017-07-07 ENCOUNTER — Other Ambulatory Visit (HOSPITAL_COMMUNITY): Payer: Self-pay | Admitting: Cardiology

## 2017-07-22 ENCOUNTER — Ambulatory Visit (INDEPENDENT_AMBULATORY_CARE_PROVIDER_SITE_OTHER): Payer: Medicare Other

## 2017-07-22 DIAGNOSIS — I739 Peripheral vascular disease, unspecified: Secondary | ICD-10-CM

## 2017-07-28 ENCOUNTER — Ambulatory Visit (HOSPITAL_COMMUNITY)
Admission: RE | Admit: 2017-07-28 | Discharge: 2017-07-28 | Disposition: A | Discharge status: Home / Self Care | Payer: Medicare Other | Source: Ambulatory Visit | Attending: Cardiology | Admitting: Cardiology

## 2017-07-28 VITALS — BP 107/49 | HR 55 | Wt 108.8 lb

## 2017-07-28 DIAGNOSIS — Z79899 Other long term (current) drug therapy: Secondary | ICD-10-CM | POA: Insufficient documentation

## 2017-07-28 DIAGNOSIS — I428 Other cardiomyopathies: Secondary | ICD-10-CM | POA: Insufficient documentation

## 2017-07-28 DIAGNOSIS — I340 Nonrheumatic mitral (valve) insufficiency: Secondary | ICD-10-CM | POA: Diagnosis not present

## 2017-07-28 DIAGNOSIS — Z95810 Presence of automatic (implantable) cardiac defibrillator: Secondary | ICD-10-CM | POA: Diagnosis not present

## 2017-07-28 DIAGNOSIS — Z7982 Long term (current) use of aspirin: Secondary | ICD-10-CM | POA: Diagnosis not present

## 2017-07-28 DIAGNOSIS — Z720 Tobacco use: Secondary | ICD-10-CM | POA: Diagnosis not present

## 2017-07-28 DIAGNOSIS — I739 Peripheral vascular disease, unspecified: Secondary | ICD-10-CM | POA: Insufficient documentation

## 2017-07-28 DIAGNOSIS — Z95828 Presence of other vascular implants and grafts: Secondary | ICD-10-CM | POA: Diagnosis not present

## 2017-07-28 DIAGNOSIS — J449 Chronic obstructive pulmonary disease, unspecified: Secondary | ICD-10-CM | POA: Diagnosis not present

## 2017-07-28 DIAGNOSIS — Z8774 Personal history of (corrected) congenital malformations of heart and circulatory system: Secondary | ICD-10-CM | POA: Diagnosis not present

## 2017-07-28 DIAGNOSIS — F17210 Nicotine dependence, cigarettes, uncomplicated: Secondary | ICD-10-CM | POA: Diagnosis not present

## 2017-07-28 DIAGNOSIS — I480 Paroxysmal atrial fibrillation: Secondary | ICD-10-CM | POA: Diagnosis not present

## 2017-07-28 DIAGNOSIS — E785 Hyperlipidemia, unspecified: Secondary | ICD-10-CM | POA: Diagnosis not present

## 2017-07-28 DIAGNOSIS — I5022 Chronic systolic (congestive) heart failure: Secondary | ICD-10-CM | POA: Insufficient documentation

## 2017-07-28 DIAGNOSIS — Z8249 Family history of ischemic heart disease and other diseases of the circulatory system: Secondary | ICD-10-CM | POA: Insufficient documentation

## 2017-07-28 DIAGNOSIS — I2510 Atherosclerotic heart disease of native coronary artery without angina pectoris: Secondary | ICD-10-CM | POA: Insufficient documentation

## 2017-07-28 LAB — BASIC METABOLIC PANEL
Anion gap: 9 (ref 5–15)
BUN: 10 mg/dL (ref 8–23)
CALCIUM: 9.3 mg/dL (ref 8.9–10.3)
CO2: 29 mmol/L (ref 22–32)
Chloride: 102 mmol/L (ref 98–111)
Creatinine, Ser: 0.81 mg/dL (ref 0.44–1.00)
GFR calc Af Amer: >60 mL/min (ref >60)
Glucose, Bld: 102 mg/dL — ABNORMAL HIGH (ref 70–99)
POTASSIUM: 4.6 mmol/L (ref 3.5–5.1)
SODIUM: 140 mmol/L (ref 135–145)

## 2017-07-28 LAB — LIPID PANEL
Cholesterol: 162 mg/dL (ref 0–200)
HDL: 86 mg/dL (ref >40)
LDL CALC: 61 mg/dL (ref 0–99)
TRIGLYCERIDES: 74 mg/dL (ref <150)
Total CHOL/HDL Ratio: 1.9 RATIO
VLDL: 15 mg/dL (ref 0–40)

## 2017-07-28 LAB — DIGOXIN LEVEL: Digoxin Level: 1.2 ng/mL (ref 0.8–2.0)

## 2017-07-28 MED ORDER — SPIRONOLACTONE 25 MG PO TABS: 12.5000 mg | ORAL_TABLET | Freq: Every day | ORAL | 3 refills | Status: DC

## 2017-07-28 NOTE — Progress Notes (Signed)
PCP: Dr. Jenny Reichmann Cardiology: Dr. Harrington Challenger HF Cardiology: Dr. Aundra Dubin  72 y.o.with history of VSD repair as a child, COPD/active smoking, PAD, and chronic systolic CHF was referred by Dr. Harrington Challenger for evaluation of CHF.   Patient has a long history of cardiomyopathy.  She had VSD repaired as a child and imaging has not showed residual VSD.  Cardiac MRI in 11/09 showed EF 36%.  Echo in 12/15 showed EF 40-45%.  Echo in 3/18 showed EF down to 15-20% with moderate RV systolic dysfunction.  RHC/LHC in 10/18 showed nonobstructive coronary but was concerning for low output (CI 1.33).  She had a right lower leg slowly healing ulcer => this is now mostly better.  She had peripheral arteriogram with bilateral occluded CFAs. She had PCI to right CFA.    CPX in 1/19 showed moderate to severe functional limitation, combination of HF and lung disease, probably more related to the lung disease.  PFTs in 12/18 showed severe COPD.   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    She returns for followup of CHF.  Currently, she denies claudication and no pedal ulcerations.  However, 7/19 peripheral arterial dopplers showed significant in-stent restenosis right SFA stent.  She is still smoking about 1/2 ppd.  Cardiac symptoms are stable.  She gets short of breath when she walks long distances in the heat.  No orthopnea/PND.  No chest pain.  No palpitations.   [name] device interrogation: Heartlogic score 0.  No AT/AF.  No VT.  84% BiV pacing.    Labs (7/18): LDL 61 Labs (11/18): K 4.2, creatinine 0.7, hgb 13.7 Labs (1/19): K 4.3, creatinine 1.07 Labs (2/19): K 4.5, creatinine 1.14  PMH: 1. VSD: s/p repair at [facility] as child.  From description, sounds like muscular VSD.  2. Atrial tachycardia s/p ablation.  3. Hyperlipidemia 4. COPD: Active smoker.  - PFTs (12/18): Severe obstructive lung disease.  5. Atrial fibrillation: Paroxysmal.   Noted only transiently.   6. PAD: ABIs 11/18 with 0.62 on right, 0.72 on  left.  - Angiogram 11/18 showed totally occluded bilateral CFAs.  Patient had stent to right CFA.  ABIs in 12/18 were normal on right. - Peripheral arterial dopplers (7/19) with significant in-stent restenosis right CFA.   7. Chronic systolic CHF: Nonischemic cardiomyopathy.   - cMRI (11/09): EF 36%, VSD patch in anterior ventricular septum - Echo (12/15): EF 40-45% - Echo (3/18): EF 15-20%, moderate LV dilation, moderate MR, moderate RV dilation with moderately decreased systolic function.  - LHC/RHC (10/18): 3+ MR, EF < 25%, minimal nonobstructive CAD; PA 41/21, LVEDP 18, CI 1.33.  - CPX (1/19): peak VO2 13.4, VE/VCO2 slope 43, RER 1.07.  Mod-severe functional limitation due to combination of HF and lung disease, probably more related to lung disease.  - [name] CRT-D device.   SH: Active smoker, lives in [place], married.  FH: Mother with PAD, sister died at birth from congenital heart disease.   ROS: All systems reviewed and negative except as per HPI.   Current Outpatient Medications  Medication Sig Dispense Refill  . albuterol (VENTOLIN HFA) 108 (90 Base) MCG/ACT inhaler Inhale 2 puffs into the lungs every 6 (six) hours as needed for wheezing or shortness of breath. 18 g 11  . ALPRAZolam (XANAX) 0.25 MG tablet TAKE 1 TABLET BY MOUTH TWICE A DAY AS NEEDED FOR ANXIETY. (Patient taking differently: TAKE 1 TABLET BY MOUTH TWICE A DAY AS NEEDED FOR ANXIETY. (TYPICALLY AT BEDTIME)) 60 tablet  2  . aspirin 81 MG tablet Take 1 tablet (81 mg total) by mouth daily. 100 tablet 99  . buPROPion (WELLBUTRIN SR) 150 MG 12 hr tablet Take 1 tablet (150 mg total) by mouth daily for 3 days, THEN 1 tablet (150 mg total) 2 (two) times daily. 60 tablet 3  . carvedilol (COREG) 3.125 MG tablet Take 1 tablet (3.125 mg total) by mouth 2 (two) times daily. 180 tablet 3  . celecoxib (CELEBREX) 100 MG capsule Take 100 mg by mouth 2 (two) times daily as needed for moderate pain.    Marland Kitchen digoxin (LANOXIN)  0.125 MG tablet Take 1 tablet (0.125 mg total) by mouth daily. 90 tablet 3  . ENTRESTO 24-26 MG TAKE 1 TABLET BY MOUTH TWICE A DAY 60 tablet 3  . furosemide (LASIX) 20 MG tablet Take 1 tablet (20 mg total) by mouth daily. (Patient taking differently: Take 20 mg by mouth every other day. IN THE MORNING.) 30 tablet 3  . ibuprofen (ADVIL,MOTRIN) 200 MG tablet Take 200 mg by mouth daily as needed for headache.    . naproxen sodium (ALEVE) 220 MG tablet Take 220 mg by mouth daily as needed (for headaches.).    Marland Kitchen potassium chloride (K-DUR) 10 MEQ tablet Take 1 tablet (10 mEq total) daily by mouth. (Patient taking differently: Take 10 mEq by mouth every other day. IN THE MORNING.) 90 tablet 3  . rosuvastatin (CRESTOR) 10 MG tablet TAKE 1 TABLET BY MOUTH EVERY DAY 90 tablet 1  . chlorpheniramine-HYDROcodone (TUSSIONEX PENNKINETIC ER) 10-8 MG/5ML SUER Take 5 mLs by mouth every 12 (twelve) hours as needed for cough. (Patient not taking: Reported on 07/28/2017) 140 mL 0  . colchicine 0.6 MG tablet Take 1 tablet TID until GI upset or flare subsides, then for maintenance, take 1 tablet daily. (Patient not taking: Reported on 07/28/2017) 90 tablet 3  . spironolactone (ALDACTONE) 25 MG tablet Take 0.5 tablets (12.5 mg total) by mouth daily. 15 tablet 3   No current facility-administered medications for this encounter.    BP (!) 107/49   Pulse (!) 55   Wt 108 lb 12.8 oz (49.4 kg)   SpO2 100%   BMI 20.56 kg/m  General: NAD Neck: No JVD, no thyromegaly or thyroid nodule.  Lungs: Distant breath sounds, rhonchi bilaterally.  CV: Nondisplaced PMI.  Heart regular S1/S2, no S3/S4, 2/6 HSM LLSB/apex.  No peripheral edema.  No carotid bruit.  Unable to palpate pedal pulses.  Abdomen: Soft, nontender, no hepatosplenomegaly, no distention.  Skin: Intact without lesions or rashes.  Neurologic: Alert and oriented x 3.  Psych: Normal affect. Extremities: No clubbing or cyanosis.  HEENT: Normal.   Assessment/Plan: 1.   PAD: She had PCI to occluded right CFA in 11/18, but 7/19 peripheral arterial dopplers showed significant in-stent restenosis in the right CFA. She has occluded left CFA also that was not intervened on. No significant claudication, no pedal ulcers.  - Needs to stop smoking.  - Continue statin.  - She has followup with Dr. Fletcher Anon later this month.  2. Smoking: I strongly encouraged her to quit.  She will restart bupropion.  I will give her a prescription today.  3. CAD: Nonobstructive on 10/18 cath.  4. Hyperlipidemia: Goal LDL < 70 , check lipids today.  Continue Crestor.   5. COPD: Severe by 12/18 PFTs.  Working on cutting back smoking.  COPD plays a significant role in her dyspnea.  6. Chronic systolic CHF: Nonischemic cardiomyopathy.  RHC/LHC in  10/18 showed no nonobstructive coronary disease but CI was very low at 1.33.  Her low cardiac output is out of proportion to her symptoms.  [name] CRT-D device.  CPX suggested moderate-severe functional limitation, but probably more due to COPD than CHF.  On exam, she is not volume overloaded and Heartlogic score is 0. NYHA class III symptoms.  - Continue Entresto 24/26 bid.  - Continue Lasix 20 mg every other day.  - Continue Coreg 3.125 mg bid.  - I will have her start spironolactone 12.5 mg daily with BMET today and in 10 days.  - Continue digoxin, check level today.  - She is only BiV pacing 84% of the time. No atrial fibrillation, I wonder if this is due to PACs or PVCs. Will ask Dr. Lovena Le if there is any way to optimize this.   - I will arrange for repeat echo.  - LVAD would be a difficult proposition for her given severity of lung disease.   7. Mitral regurgitation: Moderate on last echo, likely functional. Murmur on exam noted.   Followup in 2 months.   Loralie Champagne 07/28/2017

## 2017-07-28 NOTE — Patient Instructions (Addendum)
Start Spironolactone 12.5 mg (1/2 tab) daily  Restart Wellbutrin   Your physician has requested that you have an echocardiogram. Echocardiography is a painless test that uses sound waves to create images of your heart. It provides your doctor with information about the size and shape of your heart and how well your heart's chambers and valves are working. This procedure takes approximately one hour. There are no restrictions for this procedure. (they will call you)    Labs drawn today (if we do not call you, then your lab work was stable)   Your physician recommends that you return for lab work in: 10 days   Your physician recommends that you schedule a follow-up appointment in: 2 months with Dr. Aundra Dubin

## 2017-08-04 ENCOUNTER — Other Ambulatory Visit (HOSPITAL_COMMUNITY): Payer: Medicare Other

## 2017-08-04 ENCOUNTER — Other Ambulatory Visit: Payer: Self-pay | Admitting: Internal Medicine

## 2017-08-04 NOTE — Telephone Encounter (Signed)
05/25/2017 60# 

## 2017-08-07 ENCOUNTER — Other Ambulatory Visit: Payer: Self-pay

## 2017-08-07 ENCOUNTER — Ambulatory Visit (HOSPITAL_COMMUNITY): Payer: Medicare Other | Attending: Cardiology

## 2017-08-07 ENCOUNTER — Other Ambulatory Visit
Admission: RE | Admit: 2017-08-07 | Discharge: 2017-08-07 | Disposition: A | Discharge status: Home / Self Care | Payer: Medicare Other | Source: Ambulatory Visit | Attending: Cardiology | Admitting: Cardiology

## 2017-08-07 DIAGNOSIS — I110 Hypertensive heart disease with heart failure: Secondary | ICD-10-CM | POA: Insufficient documentation

## 2017-08-07 DIAGNOSIS — I5022 Chronic systolic (congestive) heart failure: Secondary | ICD-10-CM | POA: Insufficient documentation

## 2017-08-07 DIAGNOSIS — E785 Hyperlipidemia, unspecified: Secondary | ICD-10-CM | POA: Insufficient documentation

## 2017-08-07 DIAGNOSIS — J449 Chronic obstructive pulmonary disease, unspecified: Secondary | ICD-10-CM | POA: Insufficient documentation

## 2017-08-07 DIAGNOSIS — Z720 Tobacco use: Secondary | ICD-10-CM | POA: Insufficient documentation

## 2017-08-07 LAB — BASIC METABOLIC PANEL
ANION GAP: 7 (ref 5–15)
BUN: 14 mg/dL (ref 8–23)
CALCIUM: 8.8 mg/dL — AB (ref 8.9–10.3)
CO2: 30 mmol/L (ref 22–32)
CREATININE: 1.04 mg/dL — AB (ref 0.44–1.00)
Chloride: 102 mmol/L (ref 98–111)
GFR calc Af Amer: >60 mL/min (ref >60)
GFR, EST NON AFRICAN AMERICAN: 53 mL/min — AB (ref >60)
GLUCOSE: 107 mg/dL — AB (ref 70–99)
Potassium: 4.4 mmol/L (ref 3.5–5.1)
Sodium: 139 mmol/L (ref 135–145)

## 2017-08-10 ENCOUNTER — Telehealth (HOSPITAL_COMMUNITY): Payer: Self-pay | Admitting: *Deleted

## 2017-08-10 NOTE — Telephone Encounter (Signed)
Result Notes for ECHOCARDIOGRAM COMPLETE   Notes recorded by Darron Doom, RN on 08/10/2017 at 10:19 AM EDT Patient called back and she's aware, no further questions. ------  Notes recorded by Darron Doom, RN on 08/10/2017 at 10:16 AM EDT Called and left message for patient to call back. ------  Notes recorded by Larey Dresser, MD on 08/09/2017 at 11:49 PM EDT EF low at 25-30%

## 2017-09-01 ENCOUNTER — Ambulatory Visit (INDEPENDENT_AMBULATORY_CARE_PROVIDER_SITE_OTHER): Payer: Medicare Other | Admitting: Cardiovascular Disease

## 2017-09-01 ENCOUNTER — Ambulatory Visit: Payer: Medicare Other | Admitting: Physician Assistant

## 2017-09-01 ENCOUNTER — Encounter: Payer: Self-pay | Admitting: Cardiovascular Disease

## 2017-09-01 VITALS — BP 98/60 | HR 67 | Ht 60.0 in | Wt 106.0 lb

## 2017-09-01 DIAGNOSIS — E785 Hyperlipidemia, unspecified: Secondary | ICD-10-CM

## 2017-09-01 DIAGNOSIS — I739 Peripheral vascular disease, unspecified: Secondary | ICD-10-CM | POA: Diagnosis not present

## 2017-09-01 DIAGNOSIS — I5022 Chronic systolic (congestive) heart failure: Secondary | ICD-10-CM | POA: Diagnosis not present

## 2017-09-01 DIAGNOSIS — Z720 Tobacco use: Secondary | ICD-10-CM | POA: Diagnosis not present

## 2017-09-01 NOTE — Progress Notes (Signed)
Cardiology Office Note   Date:  09/01/2017   ID:  Kimberly [name], Kimberly [name] 01/11/1946, MRN [id number]  PCP:  Biagio Borg, MD  Cardiologist: Dr. Harrington Challenger  Chief Complaint  Patient presents with  . Other    Follow up from Doppler. Patient c/o SOB only when getting upset. Meds reviewed verbally with patient.       History of Present Illness: Kimberly [name] is a 72 y.o. female who is here today for a follow-up visit regarding peripheral arterial disease.    She has history of congenital heart disease status post VSD repair with multiple prior catheterizations via bilateral femoral arteries when she was a child, SVT status post ablation, hyperlipidemia, COPD, tobacco use, transient atrial fibrillation and chronic systolic heart failure. Patient was seen for peripheral arterial disease with nonhealing wound on the right lateral leg above the ankle. She underwent recent noninvasive vascular evaluation which showed a right ABI of 0.62 and left of 0.72. I proceeded with an angiogram in November, 2018 which showed moderate left common iliac artery stenosis, short occlusion of right common femoral artery with no significant infrainguinal disease and short occlusion of the left common femoral artery with no significant infrainguinal disease.  I performed successful drug-coated balloon angioplasty and self-expanding stent placement to the right common femoral artery without complications.  I felt that she was not a good candidate for femoral endarterectomy due to her cardiopulmonary status.  The wound on the right leg has healed completely and currently she has no claudication.  She has been followed closely in the heart failure clinic.  She denies any chest pain.  Shortness of breath is stable.  Unfortunately, she continues to smoke but she is trying to quit with Wellbutrin.  Recent lower extremity arterial Doppler showed slight decrease in ABI on the right with evidence of severe restenosis in the proximal  portion of the right common femoral artery stent.   Past Medical History:  Diagnosis Date  . AICD (automatic cardioverter/defibrillator) present 03/17/2017  . Anxiety   . Atrial tachycardia ([place])   . Bursitis of shoulder, right, adhesive   . CHF (congestive heart failure) ([place])   . Chronic bronchitis ([place])    "1-2 times/yr" (01/23/2014)  . COPD (chronic obstructive pulmonary disease) ([place])   . CVD (cerebrovascular disease)   . Dyslipidemia   . Dysrhythmia   . Frequency of urination   . GERD (gastroesophageal reflux disease)   . Heart murmur   . History of stomach ulcers   . HTN (hypertension) 02/22/2011  . Migraines    "stopped many years ago" (06/14/2014)  . Osteoporosis 08/19/2016  . Pericarditis   . Pneumonia "10 times" (06/14/2014)  . 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 Silent myocardial infarction ([place]) "late 1990's"  . Stress incontinence    "was suppose to have been tacked up years ago but I didn't do it"  . Syncope, near    Associated with atrial tachycardia-event recorder 1/16  . Thoracic outlet syndrome   . VSD (ventricular septal defect)     Past Surgical History:  Procedure Laterality Date  . ABDOMINAL AORTOGRAM W/LOWER EXTREMITY N/A 12/17/2016   Procedure: ABDOMINAL AORTOGRAM W/LOWER EXTREMITY;  Surgeon: Wellington Hampshire, MD;  Location: [facility];  Service: Cardiovascular;  Laterality: N/A;  . BIV ICD INSERTION CRT-D N/A 03/17/2017   Procedure: BIV ICD INSERTION CRT-D;  Surgeon: Evans Lance, MD;  Location: [facility];  Service:  Cardiovascular;  Laterality: N/A;  . CARDIAC CATHETERIZATION  "quite a few"  . [place]  . CHOLECYSTECTOMY OPEN  1970's  . CORONARY ANGIOGRAM  2000   No significant CAD  . ELECTROPHYSIOLOGIC STUDY N/A 06/14/2014   Procedure: A-Flutter/A-Tach/SVT Ablation;  Surgeon: Evans Lance, MD;  Location: [facility];  Service: Cardiovascular;  Laterality: N/A;  .  INSERTION OF ICD  03/17/2017   BIV  . MYRINGOTOMY WITH TUBE PLACEMENT Right 2015  . PERIPHERAL VASCULAR INTERVENTION  12/17/2016   Procedure: PERIPHERAL VASCULAR INTERVENTION;  Surgeon: Wellington Hampshire, MD;  Location: [facility];  Service: Cardiovascular;;  Right common femoral PTA and Stent  . RIGHT/LEFT HEART CATH AND CORONARY ANGIOGRAPHY Bilateral 11/06/2016   Procedure: RIGHT/LEFT HEART CATH AND CORONARY ANGIOGRAPHY;  Surgeon: Wellington Hampshire, MD;  Location: [facility];  Service: Cardiovascular;  Laterality: Bilateral;  . SVT ABLATION  06/14/2014  . TUBAL LIGATION  1972  . VSD REPAIR  1958; 1967     Current Outpatient Medications  Medication Sig Dispense Refill  . albuterol (VENTOLIN HFA) 108 (90 Base) MCG/ACT inhaler Inhale 2 puffs into the lungs every 6 (six) hours as needed for wheezing or shortness of breath. 18 g 11  . ALPRAZolam (XANAX) 0.25 MG tablet TAKE 1 TABLET BY MOUTH TWICE A DAY AS NEEDED FOR ANXIETY. (TYPICALLY AT BEDTIME) 60 tablet 0  . aspirin 81 MG tablet Take 1 tablet (81 mg total) by mouth daily. 100 tablet 99  . carvedilol (COREG) 3.125 MG tablet Take 1 tablet (3.125 mg total) by mouth 2 (two) times daily. 180 tablet 3  . celecoxib (CELEBREX) 100 MG capsule Take 100 mg by mouth 2 (two) times daily as needed for moderate pain.    Marland Kitchen colchicine 0.6 MG tablet Take 1 tablet TID until GI upset or flare subsides, then for maintenance, take 1 tablet daily. 90 tablet 3  . digoxin (LANOXIN) 0.125 MG tablet Take 1 tablet (0.125 mg total) by mouth daily. 90 tablet 3  . ENTRESTO 24-26 MG TAKE 1 TABLET BY MOUTH TWICE A DAY 60 tablet 3  . furosemide (LASIX) 20 MG tablet Take 1 tablet (20 mg total) by mouth daily. (Patient taking differently: Take 20 mg by mouth every other day. IN THE MORNING.) 30 tablet 3  . ibuprofen (ADVIL,MOTRIN) 200 MG tablet Take 200 mg by mouth daily as needed for headache.    . naproxen sodium (ALEVE) 220 MG tablet Take 220 mg by mouth  daily as needed (for headaches.).    Marland Kitchen potassium chloride (K-DUR) 10 MEQ tablet Take 1 tablet (10 mEq total) daily by mouth. (Patient taking differently: Take 10 mEq by mouth every other day. IN THE MORNING.) 90 tablet 3  . rosuvastatin (CRESTOR) 10 MG tablet TAKE 1 TABLET BY MOUTH EVERY DAY 90 tablet 1  . spironolactone (ALDACTONE) 25 MG tablet Take 0.5 tablets (12.5 mg total) by mouth daily. 15 tablet 3  . buPROPion (WELLBUTRIN SR) 150 MG 12 hr tablet Take 1 tablet (150 mg total) by mouth daily for 3 days, THEN 1 tablet (150 mg total) 2 (two) times daily. 60 tablet 3   No current facility-administered medications for this visit.     Allergies:   Mupirocin and Codeine    Social History:  The patient  reports that she has been smoking cigarettes. She has a 11.55 pack-year smoking history. She has never used smokeless tobacco. She reports that she drinks alcohol. She reports  that she does not use drugs.   Family History:  The patient's family history includes Alcohol abuse in her other; Arthritis in her other and other; Cancer in her father, mother, and other; Hypertension in her other and unknown relative; Stroke in her other and unknown relative; Throat cancer in her unknown relative.    ROS:  Please see the history of present illness.   Otherwise, review of systems are positive for none.   All other systems are reviewed and negative.    PHYSICAL EXAM: VS:  BP 98/60 (BP Location: Left Arm, Patient Position: Sitting, Cuff Size: Normal)   Pulse 67   Ht 5' (1.524 m)   Wt 106 lb (48.1 kg)   BMI 20.70 kg/m  , BMI Body mass index is 20.7 kg/m. GEN: Well nourished, well developed, in no acute distress  HEENT: normal  Neck: no JVD, carotid bruits, or masses Cardiac: RRR; no  rubs, or gallops,no edema.  3 out of 6 holosystolic murmur at the left sternal border and apex. Respiratory:  clear to auscultation bilaterally, normal work of breathing GI: soft, nontender, nondistended, + BS MS: no  deformity or atrophy  Skin: warm and dry, no rash Neuro:  Strength and sensation are intact Psych: euthymic mood, full affect Vascular: Radial pulse is absent bilaterally.  Femoral pulse: +2 on the right side and +1 on the left side.  Dorsalis pedis is palpable on the right side.  EKG:  EKG is ordered today. The ekg ordered today demonstrates sinus rhythm with ventricular paced rhythm.   Recent Labs: 10/01/2016: NT-Pro BNP 3,221 03/13/2017: Hemoglobin 14.1; Platelets 276 08/07/2017: BUN 14; Creatinine, Ser 1.04; Potassium 4.4; Sodium 139    Lipid Panel    Component Value Date/Time   CHOL 162 07/28/2017 1125   TRIG 74 07/28/2017 1125   HDL 86 07/28/2017 1125   CHOLHDL 1.9 07/28/2017 1125   VLDL 15 07/28/2017 1125   LDLCALC 61 07/28/2017 1125   LDLDIRECT 112.6 03/16/2013 1057      Wt Readings from Last 3 Encounters:  09/01/17 106 lb (48.1 kg)  07/28/17 108 lb 12.8 oz (49.4 kg)  06/17/17 109 lb (49.4 kg)       No flowsheet data found.    ASSESSMENT AND PLAN:  1.  Peripheral arterial disease .  Status post successful endovascular revascularization of the right common femoral artery. Recent Doppler showed mild drop in ABI on the right with evidence of significant restenosis in the proximal portion of the right common femoral artery stent.  By exam, her right femoral pulse is close to normal and she continues to have palpable dorsalis pedis pulse.  She reports no claudication and the wound on her right leg has healed completely.  Thus, at the present time, there is no indication for revascularization.  I am going to continue to monitor her closely and will have her follow-up with me in 6 months.  2.  Chronic systolic heart failure: Due to nonischemic cardiomyopathy.  She appears to be euvolemic and currently on optimal medical therapy followed at the heart failure clinic.  3.  Hyperlipidemia: Continue treatment with rosuvastatin with a target LDL of less than 70.  4.  Tobacco  use: I again discussed with her the importance of smoking cessation.  I explained to her the correlation between progression of peripheral arterial disease, restenosis and continued tobacco use.    Disposition:   FU with me in 6 month  Signed,  Kathlyn Sacramento, MD  09/01/2017 4:02  PM    [facility]

## 2017-09-01 NOTE — Patient Instructions (Signed)
Medication Instructions: Your physician recommends that you continue on your current medications as directed. Please refer to the Current Medication list given to you today.  If you need a refill on your cardiac medications before your next appointment, please call your pharmacy.   Follow-Up: Your physician wants you to follow-up in 6 months with Dr. Arida. You will receive a reminder letter in the mail two months in advance. If you don't receive a letter, please call our office at [phone] to schedule this follow-up appointment.  Thank you for choosing [facility]!    

## 2017-09-10 ENCOUNTER — Encounter: Payer: Self-pay | Admitting: Internal Medicine

## 2017-09-16 ENCOUNTER — Ambulatory Visit (INDEPENDENT_AMBULATORY_CARE_PROVIDER_SITE_OTHER): Payer: Medicare Other | Admitting: *Deleted

## 2017-09-16 DIAGNOSIS — I5022 Chronic systolic (congestive) heart failure: Secondary | ICD-10-CM

## 2017-09-16 NOTE — Progress Notes (Signed)
Remote ICD transmission.   

## 2017-09-18 ENCOUNTER — Telehealth: Payer: Self-pay | Admitting: Cardiovascular Disease

## 2017-09-18 NOTE — Telephone Encounter (Signed)
Patient calling to make office aware  States that the stent that was placed in leg was not bothering her at appointment on 8/13 but since it has started to bother her a lot Please call to discuss

## 2017-09-18 NOTE — Telephone Encounter (Signed)
It is unusual for discomfort from peripheral arterial disease to get better with leg elevation.  She can continue doing that and rest over the weekend.  If symptoms do not improve by next week, she should let us know as we might need to repeat her angiography.

## 2017-09-18 NOTE — Telephone Encounter (Signed)
Patient called and made aware of Dr. Tyrell Antonio recommendations. She will call back next week with an update.

## 2017-09-18 NOTE — Telephone Encounter (Signed)
Returned the call to the patient. She stated that as of Sunday she has been having right lower extremity pain. The pain comes when she ambulates and is relieved when she sits and elevates her legs. She denies any swelling, redness or temperature change. She currently takes 81 mg aspirin daily.  ABI in July was decreased slightly on the right side.

## 2017-09-23 ENCOUNTER — Other Ambulatory Visit: Payer: Self-pay | Admitting: Internal Medicine

## 2017-09-29 ENCOUNTER — Ambulatory Visit (HOSPITAL_COMMUNITY)
Admission: RE | Admit: 2017-09-29 | Discharge: 2017-09-29 | Disposition: A | Discharge status: Home / Self Care | Payer: Medicare Other | Source: Ambulatory Visit | Attending: Cardiology | Admitting: Cardiology

## 2017-09-29 ENCOUNTER — Telehealth (HOSPITAL_COMMUNITY): Payer: Self-pay

## 2017-09-29 VITALS — BP 104/56 | HR 61 | Wt 107.0 lb

## 2017-09-29 DIAGNOSIS — Z8249 Family history of ischemic heart disease and other diseases of the circulatory system: Secondary | ICD-10-CM | POA: Diagnosis not present

## 2017-09-29 DIAGNOSIS — Z950 Presence of cardiac pacemaker: Secondary | ICD-10-CM | POA: Diagnosis not present

## 2017-09-29 DIAGNOSIS — I739 Peripheral vascular disease, unspecified: Secondary | ICD-10-CM | POA: Insufficient documentation

## 2017-09-29 DIAGNOSIS — I480 Paroxysmal atrial fibrillation: Secondary | ICD-10-CM | POA: Diagnosis not present

## 2017-09-29 DIAGNOSIS — R9431 Abnormal electrocardiogram [ECG] [EKG]: Secondary | ICD-10-CM | POA: Insufficient documentation

## 2017-09-29 DIAGNOSIS — I340 Nonrheumatic mitral (valve) insufficiency: Secondary | ICD-10-CM | POA: Diagnosis not present

## 2017-09-29 DIAGNOSIS — E785 Hyperlipidemia, unspecified: Secondary | ICD-10-CM | POA: Insufficient documentation

## 2017-09-29 DIAGNOSIS — F17200 Nicotine dependence, unspecified, uncomplicated: Secondary | ICD-10-CM | POA: Insufficient documentation

## 2017-09-29 DIAGNOSIS — Z7982 Long term (current) use of aspirin: Secondary | ICD-10-CM | POA: Diagnosis not present

## 2017-09-29 DIAGNOSIS — Z09 Encounter for follow-up examination after completed treatment for conditions other than malignant neoplasm: Secondary | ICD-10-CM | POA: Diagnosis not present

## 2017-09-29 DIAGNOSIS — Z79899 Other long term (current) drug therapy: Secondary | ICD-10-CM | POA: Insufficient documentation

## 2017-09-29 DIAGNOSIS — J449 Chronic obstructive pulmonary disease, unspecified: Secondary | ICD-10-CM | POA: Insufficient documentation

## 2017-09-29 DIAGNOSIS — T82858A Stenosis of vascular prosthetic devices, implants and grafts, initial encounter: Secondary | ICD-10-CM | POA: Insufficient documentation

## 2017-09-29 DIAGNOSIS — I493 Ventricular premature depolarization: Secondary | ICD-10-CM

## 2017-09-29 DIAGNOSIS — I2510 Atherosclerotic heart disease of native coronary artery without angina pectoris: Secondary | ICD-10-CM | POA: Insufficient documentation

## 2017-09-29 DIAGNOSIS — I429 Cardiomyopathy, unspecified: Secondary | ICD-10-CM | POA: Diagnosis not present

## 2017-09-29 DIAGNOSIS — I5022 Chronic systolic (congestive) heart failure: Secondary | ICD-10-CM | POA: Insufficient documentation

## 2017-09-29 LAB — DIGOXIN LEVEL: Digoxin Level: 1.2 ng/mL (ref 0.8–2.0)

## 2017-09-29 LAB — BASIC METABOLIC PANEL
Anion gap: 10 (ref 5–15)
BUN: 15 mg/dL (ref 8–23)
CO2: 26 mmol/L (ref 22–32)
Calcium: 9.1 mg/dL (ref 8.9–10.3)
Chloride: 100 mmol/L (ref 98–111)
Creatinine, Ser: 0.89 mg/dL (ref 0.44–1.00)
GFR calc Af Amer: >60 mL/min (ref >60)
GFR calc non Af Amer: >60 mL/min (ref >60)
Glucose, Bld: 92 mg/dL (ref 70–99)
POTASSIUM: 4.4 mmol/L (ref 3.5–5.1)
Sodium: 136 mmol/L (ref 135–145)

## 2017-09-29 MED ORDER — ALPRAZOLAM 0.25 MG PO TABS: ORAL_TABLET | ORAL | 0 refills | Status: DC

## 2017-09-29 MED ORDER — SPIRONOLACTONE 25 MG PO TABS: 25.0000 mg | ORAL_TABLET | Freq: Every day | ORAL | 3 refills | Status: DC

## 2017-09-29 NOTE — Patient Instructions (Signed)
Labs today (will call for abnormal results, otherwise no news is good news)  INCREASE Spironolactone to 25 mg (1 Tablet) Once Daily  Ziopatch has been applied, please see instructions for returning device.  If you have any questions please call our clinic at [phone], Option 5.   Labs in 10 days (bmet)  Follow up in 2 Months.

## 2017-09-29 NOTE — Telephone Encounter (Signed)
Medication Samples have been provided to the patient.  Drug name: Delene Loll       Strength: 24-26        Qty: 4  LOT: [id number]  Exp.Date: 06/21   Dosing instructions:  TAKE 1 TABLET BY MOUTH TWICE A DAY          The patient has been instructed regarding the correct time, dose, and frequency of taking this medication, including desired effects and most common side effects.   Vanice Sarah 11:12 AM 09/29/2017

## 2017-09-29 NOTE — Progress Notes (Signed)
PCP: Dr. Jenny Reichmann Cardiology: Dr. Harrington Challenger HF Cardiology: Dr. Aundra Dubin  72 y.o.with history of VSD repair as a child, COPD/active smoking, PAD, and chronic systolic CHF was referred by Dr. Harrington Challenger for evaluation of CHF.   Patient has a long history of cardiomyopathy.  She had VSD repaired as a child and imaging has not showed residual VSD.  Cardiac MRI in 11/09 showed EF 36%.  Echo in 12/15 showed EF 40-45%.  Echo in 3/18 showed EF down to 15-20% with moderate RV systolic dysfunction.  RHC/LHC in 10/18 showed nonobstructive coronary but was concerning for low output (CI 1.33).  She had a right lower leg slowly healing ulcer => this is now mostly better.  She had peripheral arteriogram with bilateral occluded CFAs. She had PCI to right CFA.    CPX in 1/19 showed moderate to severe functional limitation, combination of HF and lung disease, probably more related to the lung disease.  PFTs in 12/18 showed severe COPD.   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    7/19 peripheral arterial dopplers showed significant in-stent restenosis right SFA stent.  She returns for followup of CHF.  She is still smoking, says she has had too much stress to stop.  She is short of breath vacuuming, no problems with other activities around the house. She is short of breath after walking about 100 yards. She has occasional right calf pain with exertion (not common). No rest pain or pedal ulcerations.  Rare atypical chest pain.    [name] device interrogation: Heartlogic score 0.  No AT/AF.  No VT.  86% BiV pacing.    ECG (personally reviewed): NSR, PVCs, BiV pacing  Labs (7/18): LDL 61 Labs (11/18): K 4.2, creatinine 0.7, hgb 13.7 Labs (1/19): K 4.3, creatinine 1.07 Labs (2/19): K 4.5, creatinine 1.14 Labs (7/19): K 4.4, creatinine 1.04, LDL 61, digoxin 1.2  PMH: 1. VSD: s/p repair at [facility] as child.  From description, sounds like muscular VSD.  2. Atrial tachycardia s/p ablation.  3. Hyperlipidemia 4.  COPD: Active smoker.  - PFTs (12/18): Severe obstructive lung disease.  5. Atrial fibrillation: Paroxysmal.   Noted only transiently.   6. PAD: ABIs 11/18 with 0.62 on right, 0.72 on left.  - Angiogram 11/18 showed totally occluded bilateral CFAs.  Patient had stent to right CFA.  ABIs in 12/18 were normal on right. - Peripheral arterial dopplers (7/19) with significant in-stent restenosis right CFA.   7. Chronic systolic CHF: Nonischemic cardiomyopathy.   - cMRI (11/09): EF 36%, VSD patch in anterior ventricular septum - Echo (12/15): EF 40-45% - Echo (3/18): EF 15-20%, moderate LV dilation, moderate MR, moderate RV dilation with moderately decreased systolic function.  - LHC/RHC (10/18): 3+ MR, EF < 25%, minimal nonobstructive CAD; PA 41/21, LVEDP 18, CI 1.33.  - CPX (1/19): peak VO2 13.4, VE/VCO2 slope 43, RER 1.07.  Mod-severe functional limitation due to combination of HF and lung disease, probably more related to lung disease.  - [name] CRT-D device.  - Echo (7/19): EF 25-30%, mild LV dilation, mild MR, normal RV size and systolic function.   SH: Active smoker, lives in [place], married.  FH: Mother with PAD, sister died at birth from congenital heart disease.   ROS: All systems reviewed and negative except as per HPI.   Current Outpatient Medications  Medication Sig Dispense Refill  . albuterol (VENTOLIN HFA) 108 (90 Base) MCG/ACT inhaler Inhale 2 puffs into the lungs every 6 (six) hours as  needed for wheezing or shortness of breath. 18 g 11  . ALPRAZolam (XANAX) 0.25 MG tablet TAKE 1 TABLET BY MOUTH TWICE A DAY AS NEEDED FOR ANXIETY. (TYPICALLY AT BEDTIME) 60 tablet 0  . aspirin 81 MG tablet Take 1 tablet (81 mg total) by mouth daily. 100 tablet 99  . buPROPion (WELLBUTRIN SR) 150 MG 12 hr tablet Take 1 tablet (150 mg total) by mouth daily for 3 days, THEN 1 tablet (150 mg total) 2 (two) times daily. 60 tablet 3  . carvedilol (COREG) 3.125 MG tablet Take 1 tablet  (3.125 mg total) by mouth 2 (two) times daily. 180 tablet 3  . celecoxib (CELEBREX) 100 MG capsule Take 100 mg by mouth 2 (two) times daily as needed for moderate pain.    Marland Kitchen colchicine 0.6 MG tablet Take 1 tablet TID until GI upset or flare subsides, then for maintenance, take 1 tablet daily. 90 tablet 3  . digoxin (LANOXIN) 0.125 MG tablet Take 1 tablet (0.125 mg total) by mouth daily. 90 tablet 3  . ENTRESTO 24-26 MG TAKE 1 TABLET BY MOUTH TWICE A DAY 60 tablet 3  . furosemide (LASIX) 20 MG tablet Take 1 tablet (20 mg total) by mouth daily. (Patient taking differently: Take 20 mg by mouth every other day. IN THE MORNING.) 30 tablet 3  . ibuprofen (ADVIL,MOTRIN) 200 MG tablet Take 200 mg by mouth daily as needed for headache.    . naproxen sodium (ALEVE) 220 MG tablet Take 220 mg by mouth daily as needed (for headaches.).    Marland Kitchen potassium chloride (K-DUR) 10 MEQ tablet Take 1 tablet (10 mEq total) daily by mouth. (Patient taking differently: Take 10 mEq by mouth every other day. IN THE MORNING.) 90 tablet 3  . rosuvastatin (CRESTOR) 10 MG tablet TAKE 1 TABLET BY MOUTH EVERY DAY 90 tablet 1  . spironolactone (ALDACTONE) 25 MG tablet Take 1 tablet (25 mg total) by mouth daily. 30 tablet 3   No current facility-administered medications for this encounter.    BP (!) 104/56   Pulse 61   Wt 48.5 kg (107 lb)   SpO2 97%   BMI 20.90 kg/m  General: NAD Neck: No JVD, no thyromegaly or thyroid nodule.  Lungs: Distant breath sounds.  CV: Nondisplaced PMI.  Heart regular S1/S2, no S3/S4, 2/6 HSM LLSB.  No peripheral edema.  No carotid bruit.  Unable to palpate pedal pulses.  Abdomen: Soft, nontender, no hepatosplenomegaly, no distention.  Skin: Intact without lesions or rashes.  Neurologic: Alert and oriented x 3.  Psych: Normal affect. Extremities: No clubbing or cyanosis.  HEENT: Normal.   Assessment/Plan: 1.  PAD: She had PCI to occluded right CFA in 11/18, but 7/19 peripheral arterial dopplers  showed significant in-stent restenosis in the right CFA. She has occluded left CFA also that was not intervened on. Mild claudication, no pedal ulcers.  - Needs to stop smoking.  - Continue statin.  - Follows with Dr. Fletcher Anon.  2. Smoking: I strongly encouraged her to quit.  She is willing to try nicotine patch.  3. CAD: Nonobstructive on 10/18 cath. Rare atypical chest pain.  - She is on ASA 81 and statin.  4. Hyperlipidemia: Good lipids in 7/19.  Continue Crestor.   5. COPD: Severe by 12/18 PFTs.  Working on cutting back smoking.  COPD plays a significant role in her dyspnea.  6. Chronic systolic CHF: Nonischemic cardiomyopathy.  RHC/LHC in 10/18 showed no nonobstructive coronary disease but CI was very  low at 1.33.  Her low cardiac output is out of proportion to her symptoms.  [name] CRT-D device.  CPX suggested moderate-severe functional limitation, but probably more due to COPD than CHF.  Most recent echo in 7/19 with EF 25-30%.  On exam, she is not volume overloaded and Heartlogic score is 0. NYHA class III symptoms, likely due mostly to COPD.  - Continue Entresto 24/26 bid.  - Continue Lasix 20 mg every other day. BMET today.  - Continue Coreg 3.125 mg bid.  - Increase spironolactone to 25 mg daily. BMET 10 days.  - Continue digoxin, check level today.  - She is only BiV pacing 86% of the time. No atrial fibrillation, I suspect PVCs are causing this (noted on ECG today).  I will have her wear a Zio patch x 3 days to quantify PVCs. If significant number, would consider amiodarone to try to suppress (versus trying to pace her fast enough to suppress => would need to discuss with Dr. Lovena Le).  - LVAD would be a difficult proposition for her given severity of lung disease.   7. Mitral regurgitation: Only mild on most recent echo.   Followup in 2 months.   Loralie Champagne 09/29/2017

## 2017-09-30 ENCOUNTER — Encounter (HOSPITAL_COMMUNITY): Payer: Medicare Other

## 2017-10-02 ENCOUNTER — Encounter: Payer: Self-pay | Admitting: Internal Medicine

## 2017-10-02 ENCOUNTER — Ambulatory Visit (INDEPENDENT_AMBULATORY_CARE_PROVIDER_SITE_OTHER): Payer: Medicare Other | Admitting: Internal Medicine

## 2017-10-02 VITALS — BP 108/54 | HR 49 | Ht 61.0 in | Wt 107.2 lb

## 2017-10-02 DIAGNOSIS — F17200 Nicotine dependence, unspecified, uncomplicated: Secondary | ICD-10-CM

## 2017-10-02 DIAGNOSIS — Q210 Ventricular septal defect: Secondary | ICD-10-CM | POA: Diagnosis not present

## 2017-10-02 DIAGNOSIS — I5022 Chronic systolic (congestive) heart failure: Secondary | ICD-10-CM

## 2017-10-02 DIAGNOSIS — I420 Dilated cardiomyopathy: Secondary | ICD-10-CM

## 2017-10-02 DIAGNOSIS — I471 Supraventricular tachycardia: Secondary | ICD-10-CM

## 2017-10-02 NOTE — Progress Notes (Signed)
Cardiology Office Note   Date:  10/08/2017   ID:  Kimberly, [name] 1945/01/27, MRN [id number]  PCP:  Biagio Borg, MD  Cardiologist: Dr. Harrington Challenger  Patient presents for f/u of congenital heart disease and CHF    History of Present Illness: Kimberly [name] is a 72 y.o. female with a history of congenital heart disease status post VSD repair with multiple prior catheterizations via bilateral femoral arteries when she was a child, SVT status post ablation, hyperlipidemia, COPD, tobacco use, transient atrial fibrillation , PAD  and chronic systolic heart failure.   Echo in march 2018 LVEF 15 to 20% with mod RV dysfunction   R and L heart cath showed nonobstructive CAD   CI 1.33  The pt underwent CRT D placement  Echo in July 2019 showed LVEF 25 to 30%     Interrogation showed 86% biV pacing    Due to concern that PVCs (seen on EKG) were causing decreased pacing she was set up for a Zio patch to quantitate PVCs  The pt denies dizziness with usual acitvity   No Cp   Breathing is stable         Past Medical History:  Diagnosis Date  . AICD (automatic cardioverter/defibrillator) present 03/17/2017  . Anxiety   . Atrial tachycardia ([place])   . Bursitis of shoulder, right, adhesive   . CHF (congestive heart failure) ([place])   . Chronic bronchitis ([place])    "1-2 times/yr" (01/23/2014)  . COPD (chronic obstructive pulmonary disease) ([place])   . CVD (cerebrovascular disease)   . Dyslipidemia   . Dysrhythmia   . Frequency of urination   . GERD (gastroesophageal reflux disease)   . Heart murmur   . History of stomach ulcers   . HTN (hypertension) 02/22/2011  . Migraines    "stopped many years ago" (06/14/2014)  . Osteoporosis 08/19/2016  . Pericarditis   . Pneumonia "10 times" (06/14/2014)  . 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 Silent myocardial infarction ([place]) "late 1990's"  . Stress incontinence    "was suppose to have been tacked up years ago but I didn't  do it"  . Syncope, near    Associated with atrial tachycardia-event recorder 1/16  . Thoracic outlet syndrome   . VSD (ventricular septal defect)     Past Surgical History:  Procedure Laterality Date  . ABDOMINAL AORTOGRAM W/LOWER EXTREMITY N/A 12/17/2016   Procedure: ABDOMINAL AORTOGRAM W/LOWER EXTREMITY;  Surgeon: Wellington Hampshire, MD;  Location: [facility];  Service: Cardiovascular;  Laterality: N/A;  . BIV ICD INSERTION CRT-D N/A 03/17/2017   Procedure: BIV ICD INSERTION CRT-D;  Surgeon: Evans Lance, MD;  Location: [facility];  Service: Cardiovascular;  Laterality: N/A;  . CARDIAC CATHETERIZATION  "quite a few"  . [place]  . CHOLECYSTECTOMY OPEN  1970's  . CORONARY ANGIOGRAM  2000   No significant CAD  . ELECTROPHYSIOLOGIC STUDY N/A 06/14/2014   Procedure: A-Flutter/A-Tach/SVT Ablation;  Surgeon: Evans Lance, MD;  Location: [facility];  Service: Cardiovascular;  Laterality: N/A;  . INSERTION OF ICD  03/17/2017   BIV  . MYRINGOTOMY WITH TUBE PLACEMENT Right 2015  . PERIPHERAL VASCULAR INTERVENTION  12/17/2016   Procedure: PERIPHERAL VASCULAR INTERVENTION;  Surgeon: Wellington Hampshire, MD;  Location: [facility];  Service: Cardiovascular;;  Right common femoral PTA and Stent  . RIGHT/LEFT HEART CATH AND CORONARY ANGIOGRAPHY Bilateral 11/06/2016  Procedure: RIGHT/LEFT HEART CATH AND CORONARY ANGIOGRAPHY;  Surgeon: Wellington Hampshire, MD;  Location: [facility];  Service: Cardiovascular;  Laterality: Bilateral;  . SVT ABLATION  06/14/2014  . TUBAL LIGATION  1972  . VSD REPAIR  1958; 1967     Current Outpatient Medications  Medication Sig Dispense Refill  . albuterol (VENTOLIN HFA) 108 (90 Base) MCG/ACT inhaler Inhale 2 puffs into the lungs every 6 (six) hours as needed for wheezing or shortness of breath. 18 g 11  . ALPRAZolam (XANAX) 0.25 MG tablet TAKE 1 TABLET BY MOUTH TWICE A DAY AS NEEDED FOR ANXIETY. (TYPICALLY AT  BEDTIME) 60 tablet 0  . aspirin 81 MG tablet Take 1 tablet (81 mg total) by mouth daily. 100 tablet 99  . carvedilol (COREG) 3.125 MG tablet Take 1 tablet (3.125 mg total) by mouth 2 (two) times daily. 180 tablet 3  . celecoxib (CELEBREX) 100 MG capsule Take 100 mg by mouth 2 (two) times daily as needed for moderate pain.    Marland Kitchen colchicine 0.6 MG tablet Take 1 tablet TID until GI upset or flare subsides, then for maintenance, take 1 tablet daily. 90 tablet 3  . ENTRESTO 24-26 MG TAKE 1 TABLET BY MOUTH TWICE A DAY 60 tablet 3  . ibuprofen (ADVIL,MOTRIN) 200 MG tablet Take 200 mg by mouth daily as needed for headache.    . naproxen sodium (ALEVE) 220 MG tablet Take 220 mg by mouth daily as needed (for headaches.).    Marland Kitchen potassium chloride (K-DUR) 10 MEQ tablet Take 1 tablet (10 mEq total) daily by mouth. (Patient taking differently: Take 10 mEq by mouth every other day. IN THE MORNING.) 90 tablet 3  . rosuvastatin (CRESTOR) 10 MG tablet TAKE 1 TABLET BY MOUTH EVERY DAY 90 tablet 1  . spironolactone (ALDACTONE) 25 MG tablet Take 1 tablet (25 mg total) by mouth daily. 30 tablet 3  . buPROPion (WELLBUTRIN SR) 150 MG 12 hr tablet Take 1 tablet (150 mg total) by mouth daily for 3 days, THEN 1 tablet (150 mg total) 2 (two) times daily. 60 tablet 3  . digoxin 62.5 MCG TABS Take 0.0625 mg by mouth daily. 90 tablet 3  . furosemide (LASIX) 20 MG tablet Take 1 tablet (20 mg total) by mouth every other day. IN THE MORNING. 30 tablet 3   No current facility-administered medications for this visit.     Allergies:   Mupirocin and Codeine    Social History:  The patient  reports that she has been smoking cigarettes. She has a 11.55 pack-year smoking history. She has never used smokeless tobacco. She reports that she drinks alcohol. She reports that she does not use drugs.   Family History:  The patient's family history includes Alcohol abuse in her other; Arthritis in her other and other; Cancer in her father,  mother, and other; Hypertension in her other and unknown relative; Stroke in her other and unknown relative; Throat cancer in her unknown relative.    ROS:  Please see the history of present illness.   Otherwise, review of systems are positive for none.   All other systems are reviewed and negative.    PHYSICAL EXAM: VS:  BP (!) 108/54   Pulse (!) 49   Ht 5\' 1"  (1.549 m)   Wt 107 lb 3.2 oz (48.6 kg)   SpO2 (!) 88%   BMI 20.26 kg/m  , BMI Body mass index is 20.26 kg/m. GEN: Thin 72 yo  in no  acute distress  HEENT: normal  Neck: JVP is not elevated   No, carotid bruits, or masses Cardiac: RRR; no  rubs, or gallops,no edema.  3 out of 6 holosystolic murmur at the left sternal border Respiratory:  Decreased airflow bilaterally  No rales   GI: soft, nontender, nondistended, + BS MS: no deformity or atrophy  Skin: warm and dry, no rash Neuro:  Strength and sensation are intact Psych: euthymic mood, full affect Vascular: Radial pulse is absent bilaterally.  1+ DP L   Palp on R  EKG:  EKG is not ordered today..   Recent Labs: 03/13/2017: Hemoglobin 14.1; Platelets 276 09/29/2017: BUN 15; Creatinine, Ser 0.89; Potassium 4.4; Sodium 136    Lipid Panel    Component Value Date/Time   CHOL 162 07/28/2017 1125   TRIG 74 07/28/2017 1125   HDL 86 07/28/2017 1125   CHOLHDL 1.9 07/28/2017 1125   VLDL 15 07/28/2017 1125   LDLCALC 61 07/28/2017 1125   LDLDIRECT 112.6 03/16/2013 1057      Wt Readings from Last 3 Encounters:  10/02/17 107 lb 3.2 oz (48.6 kg)  09/29/17 107 lb (48.5 kg)  09/01/17 106 lb (48.1 kg)       No flowsheet data found.    ASSESSMENT AND PLAN:  1.   Chronic systolic heart failure: Due to nonischemic cardiomyopathy. Volume status is OK   Will not make any changes she was just seen in clinic    Just turned in patch for interrogation  2.   PAD  S/p endovascular revascularizaiont R CFA   Occluded L CFA   Followed by  Rod Can    3.  Hyperlipidemia:  Continue  rosuvastatin with a target LDL of less than 70.  4.  Tobacco use: Continues to smoke   COunselled   5  Hx SVT   No recurrence    F/U in CHF clinic     Signed,  Dorris Carnes, MD  10/08/2017 12:01 AM    [place]

## 2017-10-02 NOTE — Patient Instructions (Addendum)
Your physician recommends that you continue on your current medications as directed. Please refer to the Current Medication list given to you today.   Follow up with Dr. Aundra Dubin at [facility] as directed.

## 2017-10-05 ENCOUNTER — Other Ambulatory Visit (HOSPITAL_COMMUNITY): Payer: Self-pay | Admitting: Cardiology

## 2017-10-06 ENCOUNTER — Telehealth (HOSPITAL_COMMUNITY): Payer: Self-pay | Admitting: *Deleted

## 2017-10-06 MED ORDER — DIGOXIN 62.5 MCG PO TABS: 0.0625 mg | ORAL_TABLET | Freq: Every day | ORAL | 3 refills | Status: DC

## 2017-10-06 NOTE — Telephone Encounter (Signed)
Result Notes for Digoxin level   Notes recorded by Darron Doom, RN on 10/06/2017 at 3:35 PM EDT Called and spoke with patient. She'll decrease digoxin and will have labs drawn at local lab in [place] on 9/27. Prescription mailed to patients house. MAR updated. ------  Notes recorded by Harvie Junior, CMA on 10/01/2017 at 10:31 AM EDT Left msg for pt to call for results. ------  Notes recorded by Larey Dresser, MD on 09/29/2017 at 11:47 PM EDT Decrease digoxin to 0.0625 mg daily. Check digoxin level in 10 days.

## 2017-10-07 DIAGNOSIS — I493 Ventricular premature depolarization: Secondary | ICD-10-CM | POA: Diagnosis not present

## 2017-10-09 ENCOUNTER — Other Ambulatory Visit
Admission: RE | Admit: 2017-10-09 | Discharge: 2017-10-09 | Disposition: A | Discharge status: Home / Self Care | Payer: Medicare Other | Source: Ambulatory Visit | Attending: Cardiology | Admitting: Cardiology

## 2017-10-09 DIAGNOSIS — I5022 Chronic systolic (congestive) heart failure: Secondary | ICD-10-CM | POA: Insufficient documentation

## 2017-10-09 LAB — BASIC METABOLIC PANEL
ANION GAP: 8 (ref 5–15)
BUN: 20 mg/dL (ref 8–23)
CHLORIDE: 102 mmol/L (ref 98–111)
CO2: 28 mmol/L (ref 22–32)
Calcium: 9.2 mg/dL (ref 8.9–10.3)
Creatinine, Ser: 0.93 mg/dL (ref 0.44–1.00)
GFR calc Af Amer: >60 mL/min (ref >60)
GLUCOSE: 94 mg/dL (ref 70–99)
Potassium: 4.8 mmol/L (ref 3.5–5.1)
Sodium: 138 mmol/L (ref 135–145)

## 2017-10-09 LAB — CUP PACEART REMOTE DEVICE CHECK
Implantable Lead Location: 753858
Implantable Lead Location: 753859
Implantable Lead Model: 4671
Implantable Lead Serial Number: 805938
MDC IDC LEAD IMPLANT DT: 20190226
MDC IDC LEAD IMPLANT DT: 20190226
MDC IDC LEAD IMPLANT DT: 20190226
MDC IDC LEAD LOCATION: 753860
MDC IDC LEAD SERIAL: 444536
MDC IDC LEAD SERIAL: 726363
MDC IDC PG IMPLANT DT: 20190226
MDC IDC SESS DTM: 20190920134632
Pulse Gen Serial Number: 204176

## 2017-10-12 ENCOUNTER — Telehealth: Payer: Self-pay | Admitting: Internal Medicine

## 2017-10-12 NOTE — Telephone Encounter (Signed)
This pt requesting medications not on her medication list.   Hydrocone, CPM, Tussinex.    PCP:   Dr. Jenny Reichmann.     Wasn't sure what to do with this.  Thanks.

## 2017-10-12 NOTE — Telephone Encounter (Signed)
Pt will call back tomorrow to make appt.

## 2017-10-12 NOTE — Telephone Encounter (Signed)
Copied from [place] [id number]. Topic: Quick Communication - Rx Refill/Question >> Oct 12, 2017  9:38 AM Cecelia Byars, NT wrote: Medication: hydrocone  cpm  tussinex  , this Is not on the patient the patients current med list   Has the patient contacted their pharmacy? yes (Agent: If no, request that the patient contact the pharmacy for the refill. (Agent: If yes, when and what did the pharmacy advise?  Preferred Pharmacy (with phone number or street name [place], [place] - [address] [phone] (Phone) [phone] (Fax)    Agent: Please be advised that RX refills may take up to 3 business days. We ask that you follow-up with your pharmacy.

## 2017-10-13 ENCOUNTER — Other Ambulatory Visit (HOSPITAL_COMMUNITY): Payer: Self-pay | Admitting: Surgery

## 2017-10-13 MED ORDER — CARVEDILOL 3.125 MG PO TABS: 6.2500 mg | ORAL_TABLET | Freq: Two times a day (BID) | ORAL | 3 refills | Status: DC

## 2017-10-16 ENCOUNTER — Other Ambulatory Visit
Admission: RE | Admit: 2017-10-16 | Discharge: 2017-10-16 | Disposition: A | Discharge status: Home / Self Care | Payer: Medicare Other | Source: Ambulatory Visit | Attending: Cardiology | Admitting: Cardiology

## 2017-10-16 DIAGNOSIS — I5022 Chronic systolic (congestive) heart failure: Secondary | ICD-10-CM | POA: Diagnosis not present

## 2017-10-16 LAB — DIGOXIN LEVEL: DIGOXIN LVL: 0.7 ng/mL — AB (ref 0.8–2.0)

## 2017-10-20 ENCOUNTER — Ambulatory Visit: Payer: Medicare Other | Admitting: Internal Medicine

## 2017-10-21 ENCOUNTER — Other Ambulatory Visit: Payer: Self-pay

## 2017-10-21 DIAGNOSIS — R5383 Other fatigue: Secondary | ICD-10-CM

## 2017-10-21 NOTE — Progress Notes (Signed)
Order received from Dr. Lovena Le for ECHO for AV optimization.

## 2017-10-22 ENCOUNTER — Other Ambulatory Visit (HOSPITAL_COMMUNITY): Payer: Self-pay | Admitting: Cardiology

## 2017-10-27 ENCOUNTER — Ambulatory Visit (INDEPENDENT_AMBULATORY_CARE_PROVIDER_SITE_OTHER): Payer: Medicare Other | Admitting: Internal Medicine

## 2017-10-27 ENCOUNTER — Other Ambulatory Visit: Payer: Self-pay | Admitting: Internal Medicine

## 2017-10-27 ENCOUNTER — Encounter: Payer: Self-pay | Admitting: Internal Medicine

## 2017-10-27 VITALS — BP 116/78 | Temp 98.3°F | Ht 61.0 in | Wt 107.0 lb

## 2017-10-27 DIAGNOSIS — Z23 Encounter for immunization: Secondary | ICD-10-CM | POA: Diagnosis not present

## 2017-10-27 DIAGNOSIS — I10 Essential (primary) hypertension: Secondary | ICD-10-CM | POA: Diagnosis not present

## 2017-10-27 DIAGNOSIS — Z0000 Encounter for general adult medical examination without abnormal findings: Secondary | ICD-10-CM

## 2017-10-27 MED ORDER — HYDROCODONE-HOMATROPINE 5-1.5 MG/5ML PO SYRP: 5.0000 mL | ORAL_SOLUTION | Freq: Four times a day (QID) | ORAL | 0 refills | Status: DC | PRN

## 2017-10-27 MED ORDER — ALBUTEROL SULFATE HFA 108 (90 BASE) MCG/ACT IN AERS: 2.0000 | INHALATION_SPRAY | Freq: Four times a day (QID) | RESPIRATORY_TRACT | 11 refills | Status: DC | PRN

## 2017-10-27 NOTE — Assessment & Plan Note (Signed)
stable overall by history and exam, recent data reviewed with pt, and pt to continue medical treatment as before,  to f/u any worsening symptoms or concerns  

## 2017-10-27 NOTE — Patient Instructions (Addendum)
You had the Pneumovax pneumonia shot today  Please continue all other medications as before, and refills have been done if requested.  Please have the pharmacy call with any other refills you may need.  Please continue your efforts at being more active, low cholesterol diet, and weight control.  You are otherwise up to date with prevention measures today.  Please keep your appointments with your specialists as you may have planned  Please return in 1 year for your yearly visit, or sooner if needed, with Lab testing done 3-5 days before  

## 2017-10-27 NOTE — Progress Notes (Signed)
Subjective:    Patient ID: Kimberly [name], female    DOB: 13-Jun-1945, 72 y.o.   MRN: [id number]  HPI  Here for wellness and f/u;  Overall doing ok;  Pt denies Chest pain, worsening SOB, DOE, wheezing, orthopnea, PND, worsening LE edema, palpitations, dizziness or syncope.  Pt denies neurological change such as new headache, facial or extremity weakness.  Pt denies polydipsia, polyuria, or low sugar symptoms. Pt states overall good compliance with treatment and medications, good tolerability, and has been trying to follow appropriate diet.  Pt denies worsening depressive symptoms, suicidal ideation or panic. No fever, night sweats, wt loss, loss of appetite, or other constitutional symptoms.  Pt states good ability with ADL's, has low fall risk, home safety reviewed and adequate, no other significant changes in hearing or vision, and only occasionally active with exercise. Still tyring to quit smoking, plans to try otc.  Had flu shot last wk.  Needs refill cough med for persistent cough. Due for pneumovax Past Medical History:  Diagnosis Date  . AICD (automatic cardioverter/defibrillator) present 03/17/2017  . Anxiety   . Atrial tachycardia ([place])   . Bursitis of shoulder, right, adhesive   . CHF (congestive heart failure) ([place])   . Chronic bronchitis ([place])    "1-2 times/yr" (01/23/2014)  . COPD (chronic obstructive pulmonary disease) ([place])   . CVD (cerebrovascular disease)   . Dyslipidemia   . Dysrhythmia   . Frequency of urination   . GERD (gastroesophageal reflux disease)   . Heart murmur   . History of stomach ulcers   . HTN (hypertension) 02/22/2011  . Migraines    "stopped many years ago" (06/14/2014)  . Osteoporosis 08/19/2016  . Pericarditis   . Pneumonia "10 times" (06/14/2014)  . 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 Silent myocardial infarction ([place]) "late 1990's"  . Stress incontinence    "was suppose to have been tacked up years ago but I  didn't do it"  . Syncope, near    Associated with atrial tachycardia-event recorder 1/16  . Thoracic outlet syndrome   . VSD (ventricular septal defect)    Past Surgical History:  Procedure Laterality Date  . ABDOMINAL AORTOGRAM W/LOWER EXTREMITY N/A 12/17/2016   Procedure: ABDOMINAL AORTOGRAM W/LOWER EXTREMITY;  Surgeon: Wellington Hampshire, MD;  Location: [facility];  Service: Cardiovascular;  Laterality: N/A;  . BIV ICD INSERTION CRT-D N/A 03/17/2017   Procedure: BIV ICD INSERTION CRT-D;  Surgeon: Evans Lance, MD;  Location: [facility];  Service: Cardiovascular;  Laterality: N/A;  . CARDIAC CATHETERIZATION  "quite a few"  . [place]  . CHOLECYSTECTOMY OPEN  1970's  . CORONARY ANGIOGRAM  2000   No significant CAD  . ELECTROPHYSIOLOGIC STUDY N/A 06/14/2014   Procedure: A-Flutter/A-Tach/SVT Ablation;  Surgeon: Evans Lance, MD;  Location: [facility];  Service: Cardiovascular;  Laterality: N/A;  . INSERTION OF ICD  03/17/2017   BIV  . MYRINGOTOMY WITH TUBE PLACEMENT Right 2015  . PERIPHERAL VASCULAR INTERVENTION  12/17/2016   Procedure: PERIPHERAL VASCULAR INTERVENTION;  Surgeon: Wellington Hampshire, MD;  Location: [facility];  Service: Cardiovascular;;  Right common femoral PTA and Stent  . RIGHT/LEFT HEART CATH AND CORONARY ANGIOGRAPHY Bilateral 11/06/2016   Procedure: RIGHT/LEFT HEART CATH AND CORONARY ANGIOGRAPHY;  Surgeon: Wellington Hampshire, MD;  Location: [facility];  Service: Cardiovascular;  Laterality: Bilateral;  . SVT ABLATION  06/14/2014  .  TUBAL LIGATION  1972  . VSD REPAIR  1958; 1967    reports that she has been smoking cigarettes. She has a 11.55 pack-year smoking history. She has never used smokeless tobacco. She reports that she drinks about 1.0 standard drinks of alcohol per week. She reports that she does not use drugs. family history includes Alcohol abuse in her other; Arthritis in her other and other; Cancer  in her father, mother, and other; Hypertension in her other and unknown relative; Stroke in her other and unknown relative; Throat cancer in her unknown relative. Allergies  Allergen Reactions  . Mupirocin Other (See Comments)    Burning, pain, swelling and sob  . Codeine Nausea Only   Current Outpatient Medications on File Prior to Visit  Medication Sig Dispense Refill  . ALPRAZolam (XANAX) 0.25 MG tablet TAKE 1 TABLET BY MOUTH TWICE A DAY AS NEEDED FOR ANXIETY. (TYPICALLY AT BEDTIME) 60 tablet 0  . aspirin 81 MG tablet Take 1 tablet (81 mg total) by mouth daily. 100 tablet 99  . carvedilol (COREG) 3.125 MG tablet Take 2 tablets (6.25 mg total) by mouth 2 (two) times daily. 360 tablet 3  . celecoxib (CELEBREX) 100 MG capsule Take 100 mg by mouth 2 (two) times daily as needed for moderate pain.    Marland Kitchen colchicine 0.6 MG tablet Take 1 tablet TID until GI upset or flare subsides, then for maintenance, take 1 tablet daily. 90 tablet 3  . digoxin 62.5 MCG TABS Take 0.0625 mg by mouth daily. 90 tablet 3  . ENTRESTO 24-26 MG TAKE 1 TABLET BY MOUTH TWICE A DAY 60 tablet 3  . furosemide (LASIX) 20 MG tablet Take 1 tablet (20 mg total) by mouth every other day. IN THE MORNING. 30 tablet 3  . ibuprofen (ADVIL,MOTRIN) 200 MG tablet Take 200 mg by mouth daily as needed for headache.    . naproxen sodium (ALEVE) 220 MG tablet Take 220 mg by mouth daily as needed (for headaches.).    Marland Kitchen potassium chloride (K-DUR) 10 MEQ tablet Take 1 tablet (10 mEq total) daily by mouth. (Patient taking differently: Take 10 mEq by mouth every other day. IN THE MORNING.) 90 tablet 3  . rosuvastatin (CRESTOR) 10 MG tablet TAKE 1 TABLET BY MOUTH EVERY DAY 90 tablet 1  . spironolactone (ALDACTONE) 25 MG tablet Take 1 tablet (25 mg total) by mouth daily. 30 tablet 3  . buPROPion (WELLBUTRIN SR) 150 MG 12 hr tablet Take 1 tablet (150 mg total) by mouth daily for 3 days, THEN 1 tablet (150 mg total) 2 (two) times daily. 60 tablet 3    No current facility-administered medications on file prior to visit.    Review of Systems Constitutional: Negative for other unusual diaphoresis, sweats, appetite or weight changes HENT: Negative for other worsening hearing loss, ear pain, facial swelling, mouth sores or neck stiffness.   Eyes: Negative for other worsening pain, redness or other visual disturbance.  Respiratory: Negative for other stridor or swelling Cardiovascular: Negative for other palpitations or other chest pain  Gastrointestinal: Negative for worsening diarrhea or loose stools, blood in stool, distention or other pain Genitourinary: Negative for hematuria, flank pain or other change in urine volume.  Musculoskeletal: Negative for myalgias or other joint swelling.  Skin: Negative for other color change, or other wound or worsening drainage.  Neurological: Negative for other syncope or numbness. Hematological: Negative for other adenopathy or swelling Psychiatric/Behavioral: Negative for hallucinations, other worsening agitation, SI, self-injury, or new decreased  concentration All other system neg per pt    Objective:   Physical Exam BP 116/78   Temp 98.3 F (36.8 C) (Oral)   Ht 5\' 1"  (1.549 m)   Wt 107 lb (48.5 kg)   BMI 20.22 kg/m  VS noted,  Constitutional: Pt is oriented to person, place, and time. Appears well-developed and well-nourished, in no significant distress and comfortable Head: Normocephalic and atraumatic  Eyes: Conjunctivae and EOM are normal. Pupils are equal, round, and reactive to light Right Ear: External ear normal without discharge Left Ear: External ear normal without discharge Nose: Nose without discharge or deformity Mouth/Throat: Oropharynx is without other ulcerations and moist  Neck: Normal range of motion. Neck supple. No JVD present. No tracheal deviation present or significant neck LA or mass Cardiovascular: Normal rate, regular rhythm, normal heart sounds and intact distal  pulses.   Pulmonary/Chest: WOB normal and breath sounds without rales or wheezing  Abdominal: Soft. Bowel sounds are normal. NT. No HSM  Musculoskeletal: Normal range of motion. Exhibits no edema Lymphadenopathy: Has no other cervical adenopathy.  Neurological: Pt is alert and oriented to person, place, and time. Pt has normal reflexes. No cranial nerve deficit. Motor grossly intact, Gait intact Skin: Skin is warm and dry. No rash noted or new ulcerations Psychiatric:  Has normal mood and affect. Behavior is normal without agitation No other exam findings  Lab Results  Component Value Date   WBC 5.9 03/13/2017   HGB 14.1 03/13/2017   HCT 42.2 03/13/2017   PLT 276 03/13/2017   GLUCOSE 94 10/09/2017   CHOL 162 07/28/2017   TRIG 74 07/28/2017   HDL 86 07/28/2017   LDLDIRECT 112.6 03/16/2013   LDLCALC 61 07/28/2017   ALT 10 08/26/2016   AST 15 08/26/2016   NA 138 10/09/2017   K 4.8 10/09/2017   CL 102 10/09/2017   CREATININE 0.93 10/09/2017   BUN 20 10/09/2017   CO2 28 10/09/2017   TSH 0.99 07/25/2016   INR 1.02 12/15/2016   Declines further labs today    Assessment & Plan:

## 2017-10-27 NOTE — Assessment & Plan Note (Signed)

## 2017-10-28 ENCOUNTER — Telehealth (HOSPITAL_COMMUNITY): Payer: Self-pay

## 2017-10-28 NOTE — Telephone Encounter (Signed)
Patient states wrong cough syrup was sent in. She states she is suppose to have   chlorpheniramine-HYDROcodone Amanda [facility] ER) 10-8 MG/5ML [place], [place] [phone] (Phone) [phone] (Fax)

## 2017-10-28 NOTE — Telephone Encounter (Signed)
Medication Samples have been provided to the patient.  Drug name: Delene Loll       Strength: 24/26 mg        Qty: 1  LOT: [id number]  Exp.Date: 03/22  Dosing instructions: TAKE 1 TABLET BY MOUTH TWICE A DAY.  The patient has been instructed regarding the correct time, dose, and frequency of taking this medication, including desired effects and most common side effects.   Vanice Sarah 10:56 AM 10/28/2017

## 2017-10-29 ENCOUNTER — Telehealth (HOSPITAL_COMMUNITY): Payer: Self-pay | Admitting: Pharmacist

## 2017-10-29 ENCOUNTER — Other Ambulatory Visit: Payer: Self-pay | Admitting: Internal Medicine

## 2017-10-29 NOTE — Telephone Encounter (Signed)
Patient enrolled in PAN foundation so that she will have $1000 grant to use toward her Entresto copays through 10/29/18. [place] notified.   Member ID: [id number] Group ID: [id number] RxBin ID: [id number] PCN: [id number] Eligibility Start Date: 07/31/2017 Eligibility End Date: 10/29/2018 Assistance Amount: $1,000.00  Doroteo Bradford K. Velva Harman, PharmD, BCPS, CPP Clinical Pharmacist Phone: [phone] 10/29/2017 2:10 PM

## 2017-10-30 ENCOUNTER — Ambulatory Visit (HOSPITAL_COMMUNITY): Payer: Medicare Other | Attending: Cardiology

## 2017-10-30 ENCOUNTER — Other Ambulatory Visit: Payer: Self-pay

## 2017-10-30 DIAGNOSIS — F17200 Nicotine dependence, unspecified, uncomplicated: Secondary | ICD-10-CM | POA: Insufficient documentation

## 2017-10-30 DIAGNOSIS — I509 Heart failure, unspecified: Secondary | ICD-10-CM | POA: Diagnosis not present

## 2017-10-30 DIAGNOSIS — I4891 Unspecified atrial fibrillation: Secondary | ICD-10-CM | POA: Insufficient documentation

## 2017-10-30 DIAGNOSIS — J449 Chronic obstructive pulmonary disease, unspecified: Secondary | ICD-10-CM | POA: Diagnosis not present

## 2017-10-30 DIAGNOSIS — R5383 Other fatigue: Secondary | ICD-10-CM | POA: Diagnosis not present

## 2017-10-30 DIAGNOSIS — I341 Nonrheumatic mitral (valve) prolapse: Secondary | ICD-10-CM | POA: Diagnosis not present

## 2017-10-30 DIAGNOSIS — I110 Hypertensive heart disease with heart failure: Secondary | ICD-10-CM | POA: Diagnosis not present

## 2017-10-30 MED ORDER — HYDROCOD POLST-CPM POLST ER 10-8 MG/5ML PO SUER: 5.0000 mL | Freq: Two times a day (BID) | ORAL | 0 refills | Status: DC | PRN

## 2017-10-30 NOTE — Telephone Encounter (Signed)
Dr. Jenny Reichmann please see below messages that took place in your absence.

## 2017-10-30 NOTE — Addendum Note (Signed)
Addended by: Aviva Signs M on: 10/30/2017 10:44 AM   Modules accepted: Orders

## 2017-10-30 NOTE — Telephone Encounter (Signed)
Pt states that the HYDROcodone-homatropine (HYCODAN) 5-1.5 MG/5ML syrup  Was the wrong medication. She states she needs tussinex called into her pharmacy.

## 2017-10-30 NOTE — Addendum Note (Signed)
Addended by: Aviva Signs M on: 10/30/2017 10:46 AM   Modules accepted: Orders

## 2017-10-30 NOTE — Telephone Encounter (Signed)
Rx for hycodan was sent in by mistake.   Pt is requesting the Tussinex Pennkinetic ER. This has been pended for your review.   Please review in PCP absence.

## 2017-11-13 ENCOUNTER — Other Ambulatory Visit: Payer: Self-pay | Admitting: Cardiology

## 2017-11-16 ENCOUNTER — Other Ambulatory Visit (HOSPITAL_COMMUNITY): Payer: Self-pay | Admitting: Cardiology

## 2017-11-18 ENCOUNTER — Other Ambulatory Visit (HOSPITAL_COMMUNITY): Payer: Self-pay | Admitting: Cardiology

## 2017-11-20 ENCOUNTER — Other Ambulatory Visit (HOSPITAL_COMMUNITY): Payer: Self-pay | Admitting: Cardiology

## 2017-11-23 ENCOUNTER — Telehealth (HOSPITAL_COMMUNITY): Payer: Self-pay | Admitting: Cardiology

## 2017-11-23 ENCOUNTER — Telehealth: Payer: Self-pay | Admitting: Internal Medicine

## 2017-11-23 NOTE — Telephone Encounter (Signed)
Patient called with concerns regarding refills for xanax Reports she is unable to get refilled with PCP as last RF was done at CHF clinic on  11/16/17  Advised this may have been done in error as additional support staff was assisting during that time and may not have been fully aware of refill protocol.  Although refill was addressed/ printed patient HAS NOT picked up script  Advised I will reach out to her PCP to assist with situation and advised this is where additional refills should could  From   Left message with Dr Judi Cong office

## 2017-11-23 NOTE — Telephone Encounter (Signed)
Copied from [place] [id number]. Topic: Quick Communication - See Telephone Encounter >> Nov 23, 2017  2:24 PM Conception Chancy, NT wrote: CRM for notification. See Telephone encounter for: 11/23/17.  Chantel is calling from [facility] and states the patient is needing a refill on ALPRAZolam (XANAX) 0.25 MG tablet. She states it looks like there office refilled it but they did not and the patient never received this prescription on 11/16/17. Please advise.  Cb# [phone]

## 2017-11-24 MED ORDER — ALPRAZOLAM 0.25 MG PO TABS: ORAL_TABLET | ORAL | 2 refills | Status: DC

## 2017-11-24 NOTE — Telephone Encounter (Signed)
Spoke to pharmacy, they did not receive a refill for the Xanax from [place] office or from PCP. Please advise.

## 2017-11-24 NOTE — Addendum Note (Signed)
Addended by: Biagio Borg on: 11/24/2017 05:35 PM   Modules accepted: Orders

## 2017-11-24 NOTE — Telephone Encounter (Signed)
Ok to verify with pt that her rx did not arrive for some reason at the [facility] where Dr Aundra Dubin sent 48 with 2 refills on Oct 28  Ok to check the [place] controlled substance database to see if this was filled.

## 2017-11-24 NOTE — Telephone Encounter (Signed)
Done erx 

## 2017-11-30 ENCOUNTER — Encounter (HOSPITAL_COMMUNITY): Payer: Self-pay | Admitting: Cardiology

## 2017-11-30 ENCOUNTER — Other Ambulatory Visit: Payer: Self-pay

## 2017-11-30 ENCOUNTER — Ambulatory Visit (HOSPITAL_COMMUNITY)
Admission: RE | Admit: 2017-11-30 | Discharge: 2017-11-30 | Disposition: A | Discharge status: Home / Self Care | Payer: Medicare Other | Source: Ambulatory Visit | Attending: Cardiology | Admitting: Cardiology

## 2017-11-30 VITALS — BP 86/54 | HR 66 | Wt 107.5 lb

## 2017-11-30 DIAGNOSIS — I739 Peripheral vascular disease, unspecified: Secondary | ICD-10-CM | POA: Diagnosis not present

## 2017-11-30 DIAGNOSIS — Z79899 Other long term (current) drug therapy: Secondary | ICD-10-CM | POA: Insufficient documentation

## 2017-11-30 DIAGNOSIS — Z7982 Long term (current) use of aspirin: Secondary | ICD-10-CM | POA: Insufficient documentation

## 2017-11-30 DIAGNOSIS — I5022 Chronic systolic (congestive) heart failure: Secondary | ICD-10-CM | POA: Diagnosis not present

## 2017-11-30 DIAGNOSIS — I428 Other cardiomyopathies: Secondary | ICD-10-CM | POA: Insufficient documentation

## 2017-11-30 DIAGNOSIS — I340 Nonrheumatic mitral (valve) insufficiency: Secondary | ICD-10-CM | POA: Diagnosis not present

## 2017-11-30 DIAGNOSIS — E785 Hyperlipidemia, unspecified: Secondary | ICD-10-CM | POA: Diagnosis not present

## 2017-11-30 DIAGNOSIS — F17200 Nicotine dependence, unspecified, uncomplicated: Secondary | ICD-10-CM | POA: Diagnosis not present

## 2017-11-30 DIAGNOSIS — J449 Chronic obstructive pulmonary disease, unspecified: Secondary | ICD-10-CM | POA: Insufficient documentation

## 2017-11-30 DIAGNOSIS — I2510 Atherosclerotic heart disease of native coronary artery without angina pectoris: Secondary | ICD-10-CM | POA: Insufficient documentation

## 2017-11-30 DIAGNOSIS — R011 Cardiac murmur, unspecified: Secondary | ICD-10-CM | POA: Diagnosis not present

## 2017-11-30 DIAGNOSIS — I493 Ventricular premature depolarization: Secondary | ICD-10-CM

## 2017-11-30 LAB — BASIC METABOLIC PANEL
Anion gap: 7 (ref 5–15)
BUN: 12 mg/dL (ref 8–23)
CO2: 30 mmol/L (ref 22–32)
CREATININE: 0.99 mg/dL (ref 0.44–1.00)
Calcium: 8.8 mg/dL — ABNORMAL LOW (ref 8.9–10.3)
Chloride: 102 mmol/L (ref 98–111)
GFR calc Af Amer: >60 mL/min (ref >60)
GFR calc non Af Amer: 56 mL/min — ABNORMAL LOW (ref >60)
Glucose, Bld: 145 mg/dL — ABNORMAL HIGH (ref 70–99)
POTASSIUM: 3.7 mmol/L (ref 3.5–5.1)
Sodium: 139 mmol/L (ref 135–145)

## 2017-11-30 LAB — DIGOXIN LEVEL: DIGOXIN LVL: 0.4 ng/mL — AB (ref 0.8–2.0)

## 2017-11-30 MED ORDER — VARENICLINE TARTRATE 0.5 MG X 11 & 1 MG X 42 PO MISC: ORAL | 0 refills | Status: DC

## 2017-11-30 MED ORDER — VARENICLINE TARTRATE 1 MG PO TABS: 1.0000 mg | ORAL_TABLET | Freq: Two times a day (BID) | ORAL | 6 refills | Status: DC

## 2017-11-30 NOTE — Progress Notes (Signed)
PCP: Dr. Jenny Reichmann Cardiology: Dr. Harrington Challenger HF Cardiology: Dr. Aundra Dubin  72 y.o.with history of VSD repair as a child, COPD/active smoking, PAD, and chronic systolic CHF was referred by Dr. Harrington Challenger for evaluation of CHF.   Patient has a long history of cardiomyopathy.  She had VSD repaired as a child and imaging has not showed residual VSD.  Cardiac MRI in 11/09 showed EF 36%.  Echo in 12/15 showed EF 40-45%.  Echo in 3/18 showed EF down to 15-20% with moderate RV systolic dysfunction.  RHC/LHC in 10/18 showed nonobstructive coronary but was concerning for low output (CI 1.33).  She had a right lower leg slowly healing ulcer.  She had peripheral arteriogram with bilateral occluded CFAs. She had PCI to right CFA.    CPX in 1/19 showed moderate to severe functional limitation, combination of HF and lung disease, probably more related to the lung disease.  PFTs in 12/18 showed severe COPD.   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    7/19 peripheral arterial dopplers showed significant in-stent restenosis right SFA stent.  She returns for followup of CHF.  She is still smoking.  Weight is stable.  She has "good days and bad days."  Some days, she will fatigue easily.  She is short of breath with longer walks and going up hills.  Minimal claudication pain, notes only with a long walk.  No chest pain.  No orthopnea/PND.  No lightheadedness.  I walked her in the hall today, oxygen saturation did not drop below 90% on room air.   [name] device interrogation: Heartlogic score 0.  No AT/AF.  No VT.  88% BiV pacing.    Labs (7/18): LDL 61 Labs (11/18): K 4.2, creatinine 0.7, hgb 13.7 Labs (1/19): K 4.3, creatinine 1.07 Labs (2/19): K 4.5, creatinine 1.14 Labs (7/19): K 4.4, creatinine 1.04, LDL 61, digoxin 1.2 Labs (9/19): digoxin 0.7, K 4.8, creatinine 0.93  PMH: 1. VSD: s/p repair at [facility] as child.  From description, sounds like muscular VSD.  2. Atrial tachycardia s/p ablation.  3.  Hyperlipidemia 4. COPD: Active smoker.  - PFTs (12/18): Severe obstructive lung disease.  5. Atrial fibrillation: Paroxysmal.   Noted only transiently.   6. PAD: ABIs 11/18 with 0.62 on right, 0.72 on left.  - Angiogram 11/18 showed totally occluded bilateral CFAs.  Patient had stent to right CFA.  ABIs in 12/18 were normal on right. - Peripheral arterial dopplers (7/19) with significant in-stent restenosis right CFA.   7. Chronic systolic CHF: Nonischemic cardiomyopathy.   - cMRI (11/09): EF 36%, VSD patch in anterior ventricular septum - Echo (12/15): EF 40-45% - Echo (3/18): EF 15-20%, moderate LV dilation, moderate MR, moderate RV dilation with moderately decreased systolic function.  - LHC/RHC (10/18): 3+ MR, EF < 25%, minimal nonobstructive CAD; PA 41/21, LVEDP 18, CI 1.33.  - CPX (1/19): peak VO2 13.4, VE/VCO2 slope 43, RER 1.07.  Mod-severe functional limitation due to combination of HF and lung disease, probably more related to lung disease.  - [name] CRT-D device.  - Echo (7/19): EF 25-30%, mild LV dilation, mild MR, normal RV size and systolic function.  8. PVCs: Zio patch 9/19 with 6% PVCs.   SH: Active smoker, lives in [place], married.  FH: Mother with PAD, sister died at birth from congenital heart disease.   ROS: All systems reviewed and negative except as per HPI.   Current Outpatient Medications  Medication Sig Dispense Refill  . albuterol (VENTOLIN  HFA) 108 (90 Base) MCG/ACT inhaler Inhale 2 puffs into the lungs every 6 (six) hours as needed for wheezing or shortness of breath. 18 g 11  . ALPRAZolam (XANAX) 0.25 MG tablet TAKE 1 TABLET BY MOUTH TWICE A DAY AS NEEDED FOR ANXIETY 60 tablet 2  . aspirin 81 MG tablet Take 1 tablet (81 mg total) by mouth daily. 100 tablet 99  . carvedilol (COREG) 3.125 MG tablet Take 2 tablets (6.25 mg total) by mouth 2 (two) times daily. 360 tablet 3  . celecoxib (CELEBREX) 100 MG capsule Take 100 mg by mouth 2 (two) times  daily as needed for moderate pain.    . chlorpheniramine-HYDROcodone (TUSSIONEX PENNKINETIC ER) 10-8 MG/5ML SUER Take 5 mLs by mouth every 12 (twelve) hours as needed for cough. 115 mL 0  . colchicine 0.6 MG tablet Take 1 tablet TID until GI upset or flare subsides, then for maintenance, take 1 tablet daily. 90 tablet 3  . digoxin 62.5 MCG TABS Take 0.0625 mg by mouth daily. 90 tablet 3  . ENTRESTO 24-26 MG TAKE 1 TABLET BY MOUTH TWICE A DAY 60 tablet 3  . furosemide (LASIX) 20 MG tablet Take 1 tablet (20 mg total) by mouth every other day. IN THE MORNING. 30 tablet 3  . ibuprofen (ADVIL,MOTRIN) 200 MG tablet Take 200 mg by mouth daily as needed for headache.    . naproxen sodium (ALEVE) 220 MG tablet Take 220 mg by mouth daily as needed (for headaches.).    Marland Kitchen rosuvastatin (CRESTOR) 10 MG tablet TAKE 1 TABLET BY MOUTH EVERY DAY 90 tablet 1  . spironolactone (ALDACTONE) 25 MG tablet Take 1 tablet (25 mg total) by mouth daily. 30 tablet 3  . [START ON 12/30/2017] varenicline (CHANTIX CONTINUING MONTH PAK) 1 MG tablet Take 1 tablet (1 mg total) by mouth 2 (two) times daily. 60 tablet 6  . varenicline (CHANTIX STARTING MONTH PAK) 0.5 MG X 11 & 1 MG X 42 tablet Take one 0.5 mg tablet by mouth once daily for 3 days, then increase to one 0.5 mg tablet twice daily for 4 days, then increase to one 1 mg tablet twice daily. 53 tablet 0   No current facility-administered medications for this encounter.    BP (!) 86/54   Pulse 66   Wt 48.8 kg (107 lb 8 oz)   SpO2 99%   BMI 20.31 kg/m  General: NAD Neck: No JVD, no thyromegaly or thyroid nodule.  Lungs: Clear to auscultation bilaterally with normal respiratory effort. CV: Nondisplaced PMI.  Heart regular S1/S2, no S3/S4, 3/6 HSM apex.  No peripheral edema.  No carotid bruit.  Unable to palpate pedal pulses.  Abdomen: Soft, nontender, no hepatosplenomegaly, no distention.  Skin: Intact without lesions or rashes.  Neurologic: Alert and oriented x 3.   Psych: Normal affect. Extremities: No clubbing or cyanosis.  HEENT: Normal.   Assessment/Plan: 1.  PAD: She had PCI to occluded right CFA in 11/18, but 7/19 peripheral arterial dopplers showed significant in-stent restenosis in the right CFA. She has occluded left CFA also that was not intervened on. Mild claudication, no pedal ulcers.  - Needs to stop smoking.  - Continue statin.  - Follows with Dr. Fletcher Anon.  2. Smoking: I strongly encouraged her to quit.  She wants to try Chantix. I will give her a prescription. .  3. CAD: Nonobstructive on 10/18 cath. Rare atypical chest pain.  - She is on ASA 81 and statin.  4. Hyperlipidemia: Good  lipids in 7/19.  Continue Crestor.   5. COPD: Severe by 12/18 PFTs. COPD plays a significant role in her dyspnea. Hall walk was done today, oxygen saturation did not drop below 90% so she will not qualify for home oxygen.  6. Chronic systolic CHF: Nonischemic cardiomyopathy.  RHC/LHC in 10/18 showed no nonobstructive coronary disease but CI was very low at 1.33.  Her low cardiac output was out of proportion to her symptoms.  [name] CRT-D device.  CPX suggested moderate-severe functional limitation, but probably more due to COPD than CHF.  Most recent echo in 7/19 with EF 25-30%.  On exam, she is not volume overloaded and Heartlogic score is 0. NYHA class III symptoms, likely due mostly to COPD.  She is only BiV pacing 88% of the time, likely due to PVCs.  BP too soft today to titrate meds but she denies lightheadedness.  - Continue Entresto 24/26 bid.  - Continue Lasix 20 mg every other day. BMET today.  - Continue Coreg 3.125 mg bid.  - Continue spironolactone 25 mg daily.  - Continue digoxin, check level today.  - She is only BiV pacing 88% of the time. No atrial fibrillation, I suspect PVCs are causing this (6% on recent Zio patch).  She will see Dr. Lovena Le to address.  Would avoid using amiodarone for PVC suppression with severe COPD, ?mexiletine  would be an option.  - LVAD would be a difficult proposition for her given severity of lung disease.   7. Mitral regurgitation: Only mild on most recent echo though her murmur is prominent.   Followup in 2 months.   Loralie Champagne 11/30/2017

## 2017-11-30 NOTE — Patient Instructions (Addendum)
Chantix was ordered to your pharmacy.  We ordered the starter kit to start today. Your maintenance dose was also ordered to start after completion of the starter pack.  Labs today We will only contact you if something comes back abnormal or we need to make some changes. Otherwise no news is good news!  Your physician recommends that you schedule a follow-up appointment with Dr. Lovena Le for maintenance of your ICD.  Your physician recommends that you schedule a follow-up appointment in: 3 months with Dr. Aundra Dubin.

## 2017-12-08 ENCOUNTER — Telehealth: Payer: Self-pay

## 2017-12-08 ENCOUNTER — Emergency Department (HOSPITAL_COMMUNITY)
Admission: EM | Admit: 2017-12-08 | Discharge: 2017-12-08 | Disposition: A | Discharge status: Home / Self Care | Payer: Medicare Other | Attending: Emergency Medicine | Admitting: Emergency Medicine

## 2017-12-08 ENCOUNTER — Encounter (HOSPITAL_COMMUNITY): Payer: Self-pay | Admitting: Internal Medicine

## 2017-12-08 ENCOUNTER — Emergency Department (HOSPITAL_COMMUNITY): Payer: Medicare Other

## 2017-12-08 DIAGNOSIS — J449 Chronic obstructive pulmonary disease, unspecified: Secondary | ICD-10-CM | POA: Diagnosis not present

## 2017-12-08 DIAGNOSIS — Z79899 Other long term (current) drug therapy: Secondary | ICD-10-CM | POA: Diagnosis not present

## 2017-12-08 DIAGNOSIS — R42 Dizziness and giddiness: Secondary | ICD-10-CM | POA: Diagnosis not present

## 2017-12-08 DIAGNOSIS — F17210 Nicotine dependence, cigarettes, uncomplicated: Secondary | ICD-10-CM | POA: Diagnosis not present

## 2017-12-08 DIAGNOSIS — R55 Syncope and collapse: Secondary | ICD-10-CM | POA: Diagnosis present

## 2017-12-08 DIAGNOSIS — I951 Orthostatic hypotension: Secondary | ICD-10-CM | POA: Insufficient documentation

## 2017-12-08 DIAGNOSIS — S299XXA Unspecified injury of thorax, initial encounter: Secondary | ICD-10-CM | POA: Diagnosis not present

## 2017-12-08 DIAGNOSIS — I110 Hypertensive heart disease with heart failure: Secondary | ICD-10-CM | POA: Insufficient documentation

## 2017-12-08 DIAGNOSIS — E860 Dehydration: Secondary | ICD-10-CM

## 2017-12-08 DIAGNOSIS — I5022 Chronic systolic (congestive) heart failure: Secondary | ICD-10-CM

## 2017-12-08 DIAGNOSIS — Z7982 Long term (current) use of aspirin: Secondary | ICD-10-CM | POA: Diagnosis not present

## 2017-12-08 LAB — COMPREHENSIVE METABOLIC PANEL
ALK PHOS: 78 U/L (ref 38–126)
ALT: 12 U/L (ref 0–44)
ANION GAP: 12 (ref 5–15)
AST: 18 U/L (ref 15–41)
Albumin: 4.1 g/dL (ref 3.5–5.0)
BILIRUBIN TOTAL: 1.2 mg/dL (ref 0.3–1.2)
BUN: 19 mg/dL (ref 8–23)
CALCIUM: 9.6 mg/dL (ref 8.9–10.3)
CO2: 24 mmol/L (ref 22–32)
CREATININE: 1.17 mg/dL — AB (ref 0.44–1.00)
Chloride: 100 mmol/L (ref 98–111)
GFR calc non Af Amer: 46 mL/min — ABNORMAL LOW (ref >60)
GFR, EST AFRICAN AMERICAN: 53 mL/min — AB (ref >60)
Glucose, Bld: 109 mg/dL — ABNORMAL HIGH (ref 70–99)
Potassium: 4 mmol/L (ref 3.5–5.1)
SODIUM: 136 mmol/L (ref 135–145)
Total Protein: 7.5 g/dL (ref 6.5–8.1)

## 2017-12-08 LAB — CBC WITH DIFFERENTIAL/PLATELET
Abs Immature Granulocytes: 0.03 10*3/uL (ref 0.00–0.07)
BASOS ABS: 0.1 10*3/uL (ref 0.0–0.1)
BASOS PCT: 1 %
EOS ABS: 0.1 10*3/uL (ref 0.0–0.5)
Eosinophils Relative: 0 %
HEMATOCRIT: 43.6 % (ref 36.0–46.0)
Hemoglobin: 13.9 g/dL (ref 12.0–15.0)
Immature Granulocytes: 0 %
LYMPHS ABS: 1.5 10*3/uL (ref 0.7–4.0)
Lymphocytes Relative: 13 %
MCH: 29.7 pg (ref 26.0–34.0)
MCHC: 31.9 g/dL (ref 30.0–36.0)
MCV: 93.2 fL (ref 80.0–100.0)
MONOS PCT: 7 %
Monocytes Absolute: 0.8 10*3/uL (ref 0.1–1.0)
NEUTROS PCT: 79 %
NRBC: 0 % (ref 0.0–0.2)
Neutro Abs: 9 10*3/uL — ABNORMAL HIGH (ref 1.7–7.7)
Platelets: 273 10*3/uL (ref 150–400)
RBC: 4.68 MIL/uL (ref 3.87–5.11)
RDW: 13.7 % (ref 11.5–15.5)
WBC: 11.4 10*3/uL — ABNORMAL HIGH (ref 4.0–10.5)

## 2017-12-08 LAB — I-STAT TROPONIN, ED
TROPONIN I, POC: 0.01 ng/mL (ref 0.00–0.08)
Troponin i, poc: 0 ng/mL (ref 0.00–0.08)

## 2017-12-08 LAB — URINALYSIS, ROUTINE W REFLEX MICROSCOPIC
BACTERIA UA: NONE SEEN
Bilirubin Urine: NEGATIVE
GLUCOSE, UA: NEGATIVE mg/dL
Ketones, ur: NEGATIVE mg/dL
Leukocytes, UA: NEGATIVE
Nitrite: NEGATIVE
PH: 5 (ref 5.0–8.0)
Protein, ur: NEGATIVE mg/dL
Specific Gravity, Urine: 1.009 (ref 1.005–1.030)

## 2017-12-08 LAB — DIGOXIN LEVEL: DIGOXIN LVL: 0.3 ng/mL — AB (ref 0.8–2.0)

## 2017-12-08 MED ORDER — SODIUM CHLORIDE 0.9 % IV BOLUS
500.0000 mL | Freq: Once | INTRAVENOUS | Status: AC
  Administered 2017-12-08: 500 mL via INTRAVENOUS

## 2017-12-08 MED ORDER — SODIUM CHLORIDE 0.9 % IV SOLN: Freq: Once | INTRAVENOUS | Status: DC

## 2017-12-08 NOTE — Consult Note (Addendum)
Advanced Heart Failure Team Consult Note   Primary Physician: Biagio Borg, MD PCP-HF Cardiologist:  Dr Aundra Dubin   Reason for Consultation: Heart Failure   HPI:    Kimberly [name] is seen today for evaluation of heart failure at the request of Dr Tyrone Nine.    Kimberly [name] is a 72 year old with history of VSD repair as a child, COPD, smoker, PAD, [name] CRT-D, and chronic systolic heart failure.   She has been followed closely in HF clinic and was last seen 12/08/2017. She was only BiV pacing 88%. PVCs thought to be influencing. Amio was not recommended with COPD. She was referred back to Dr Lovena Le.   She had been in her usual state until earlier today. She was watching TV with her husband and says she got dizzy when she stood up to go to the bathroom. When she started walking to the bathroom she passed out for few seconds. She was incontinent urine. Her husband had to help her get up off the floor. She denies n/v/diarrhea. She says she took lasix yesterday. She does not drink much fluid during the day.  Denies fever or chills.    Today she presented to [facility] after syncopal episode. Earlier today she was dizzy walking to the bathroom. CXR without edema but probable emphysema. Pertinent labs include: K 4, creatinine 1.17, troponin 0, WBC 11.4, Hgb 13.9, and dig level 0.3.   [name] : Heart Logic score 7. Biv Pacing 89%. ~11 pvc burden, no VT/VF.  Denies SOB/chest pain.    Echo 10/30/2017  Left ventricle: The cavity size was normal. Wall thickness was   normal. Systolic function was moderately reduced. The estimated   ejection fraction was in the range of 35% to 40%. There is   akinesis of the basalanteroseptal myocardium. Doppler parameters   are consistent with abnormal left ventricular relaxation (grade 1   diastolic dysfunction). - Mitral valve: Calcified annulus. Mild prolapse, involving the   anterior leaflet and the posterior leaflet. Impressions: - Akinesis of  the proximal septum with overall moderate LV   dysfunction; mild diastolic dysfunction; mild MVP with trace MR.   Review of Systems: [y] = yes, [ ]  = no   General: Weight gain [ ] ; Weight loss [ ] ; Anorexia [ ] ; Fatigue [Y ]; Fever [ ] ; Chills [ ] ; Weakness [ ]   Cardiac: Chest pain/pressure [ ] ; Resting SOB [ ] ; Exertional SOB [Y ]; Orthopnea [ ] ; Pedal Edema [ ] ; Palpitations [ ] ; Syncope [ Y]; Presyncope [ y]; Paroxysmal nocturnal dyspnea[ ]   Pulmonary: Cough [ ] ; Wheezing[ ] ; Hemoptysis[ ] ; Sputum [ ] ; Snoring [ ]   GI: Vomiting[ ] ; Dysphagia[ ] ; Melena[ ] ; Hematochezia [ ] ; Heartburn[ ] ; Abdominal pain [ ] ; Constipation [ ] ; Diarrhea [ ] ; BRBPR [ ]   GU: Hematuria[ ] ; Dysuria [ ] ; Nocturia[ ]   Vascular: Pain in legs with walking [ ] ; Pain in feet with lying flat [ ] ; Non-healing sores [ ] ; Stroke [ ] ; TIA [ ] ; Slurred speech [ ] ;  Neuro: Headaches[ ] ; Vertigo[ ] ; Seizures[ ] ; Paresthesias[ ] ;Blurred vision [ ] ; Diplopia [ ] ; Vision changes [ ]   Ortho/Skin: Arthritis [ ] ; Joint pain [Y]; Muscle pain [ ] ; Joint swelling [ ] ; Back Pain [Y ]; Rash [ ]   Psych: Depression[ ] ; Anxiety[ ]   Heme: Bleeding problems [ ] ; Clotting disorders [ ] ; Anemia [ ]   Endocrine: Diabetes [ ] ; Thyroid dysfunction[ ]   Home Medications Prior to Admission  medications   Medication Sig Start Date End Date Taking? Authorizing Provider  albuterol (VENTOLIN HFA) 108 (90 Base) MCG/ACT inhaler Inhale 2 puffs into the lungs every 6 (six) hours as needed for wheezing or shortness of breath. 10/27/17   Biagio Borg, MD  ALPRAZolam Duanne Moron) 0.25 MG tablet TAKE 1 TABLET BY MOUTH TWICE A DAY AS NEEDED FOR ANXIETY 11/24/17   Biagio Borg, MD  aspirin 81 MG tablet Take 1 tablet (81 mg total) by mouth daily. 07/24/16   Biagio Borg, MD  carvedilol (COREG) 3.125 MG tablet Take 2 tablets (6.25 mg total) by mouth 2 (two) times daily. 10/13/17 10/13/18  Larey Dresser, MD  celecoxib (CELEBREX) 100 MG capsule Take 100 mg by mouth 2 (two)  times daily as needed for moderate pain.    [provider]  chlorpheniramine-HYDROcodone (TUSSIONEX PENNKINETIC ER) 10-8 MG/5ML SUER Take 5 mLs by mouth every 12 (twelve) hours as needed for cough. 10/30/17   Janith Lima, MD  colchicine 0.6 MG tablet Take 1 tablet TID until GI upset or flare subsides, then for maintenance, take 1 tablet daily. 05/18/17   Hyatt, Max T, DPM  digoxin 62.5 MCG TABS Take 0.0625 mg by mouth daily. 10/06/17   Larey Dresser, MD  ENTRESTO 24-26 MG TAKE 1 TABLET BY MOUTH TWICE A DAY 07/07/17   Larey Dresser, MD  furosemide (LASIX) 20 MG tablet Take 1 tablet (20 mg total) by mouth every other day. IN THE MORNING. 10/06/17   Larey Dresser, MD  ibuprofen (ADVIL,MOTRIN) 200 MG tablet Take 200 mg by mouth daily as needed for headache.    [provider]  naproxen sodium (ALEVE) 220 MG tablet Take 220 mg by mouth daily as needed (for headaches.).    [provider]  rosuvastatin (CRESTOR) 10 MG tablet TAKE 1 TABLET BY MOUTH EVERY DAY 09/23/17   Wellington Hampshire, MD  spironolactone (ALDACTONE) 25 MG tablet Take 1 tablet (25 mg total) by mouth daily. 09/29/17   Larey Dresser, MD  varenicline (CHANTIX CONTINUING MONTH PAK) 1 MG tablet Take 1 tablet (1 mg total) by mouth 2 (two) times daily. 12/30/17   Larey Dresser, MD  varenicline (CHANTIX STARTING MONTH PAK) 0.5 MG X 11 & 1 MG X 42 tablet Take one 0.5 mg tablet by mouth once daily for 3 days, then increase to one 0.5 mg tablet twice daily for 4 days, then increase to one 1 mg tablet twice daily. 11/30/17   Larey Dresser, MD    Past Medical History: Past Medical History:  Diagnosis Date  . AICD (automatic cardioverter/defibrillator) present 03/17/2017  . Anxiety   . Atrial tachycardia ([place])   . Bursitis of shoulder, right, adhesive   . CHF (congestive heart failure) ([place])   . Chronic bronchitis ([place])    "1-2 times/yr" (01/23/2014)  . COPD (chronic obstructive pulmonary disease) ([place])     . CVD (cerebrovascular disease)   . Dyslipidemia   . Dysrhythmia   . Frequency of urination   . GERD (gastroesophageal reflux disease)   . Heart murmur   . History of stomach ulcers   . HTN (hypertension) 02/22/2011  . Migraines    "stopped many years ago" (06/14/2014)  . Osteoporosis 08/19/2016  . Pericarditis   . Pneumonia "10 times" (06/14/2014)  . 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 Silent myocardial infarction ([place]) "late 1990's"  . Stress incontinence    "was suppose  to have been tacked up years ago but I didn't do it"  . Syncope, near    Associated with atrial tachycardia-event recorder 1/16  . Thoracic outlet syndrome   . VSD (ventricular septal defect)     Past Surgical History: Past Surgical History:  Procedure Laterality Date  . ABDOMINAL AORTOGRAM W/LOWER EXTREMITY N/A 12/17/2016   Procedure: ABDOMINAL AORTOGRAM W/LOWER EXTREMITY;  Surgeon: Wellington Hampshire, MD;  Location: [place] [facility];  Service: Cardiovascular;  Laterality: N/A;  . BIV ICD INSERTION CRT-D N/A 03/17/2017   Procedure: BIV ICD INSERTION CRT-D;  Surgeon: Evans Lance, MD;  Location: [place] [facility];  Service: Cardiovascular;  Laterality: N/A;  . CARDIAC CATHETERIZATION  "quite a few"  . [place]  . CHOLECYSTECTOMY OPEN  1970's  . CORONARY ANGIOGRAM  2000   No significant CAD  . ELECTROPHYSIOLOGIC STUDY N/A 06/14/2014   Procedure: A-Flutter/A-Tach/SVT Ablation;  Surgeon: Evans Lance, MD;  Location: [place] [facility];  Service: Cardiovascular;  Laterality: N/A;  . INSERTION OF ICD  03/17/2017   BIV  . MYRINGOTOMY WITH TUBE PLACEMENT Right 2015  . PERIPHERAL VASCULAR INTERVENTION  12/17/2016   Procedure: PERIPHERAL VASCULAR INTERVENTION;  Surgeon: Wellington Hampshire, MD;  Location: [facility];  Service: Cardiovascular;;  Right common femoral PTA and Stent  . RIGHT/LEFT HEART CATH AND CORONARY ANGIOGRAPHY Bilateral 11/06/2016    Procedure: RIGHT/LEFT HEART CATH AND CORONARY ANGIOGRAPHY;  Surgeon: Wellington Hampshire, MD;  Location: [place] [facility];  Service: Cardiovascular;  Laterality: Bilateral;  . SVT ABLATION  06/14/2014  . TUBAL LIGATION  1972  . VSD REPAIR  1958; 26    Family History: Family History  Problem Relation Age of Onset  . Cancer Mother   . Cancer Father   . Throat cancer Unknown   . Hypertension Unknown   . Stroke Unknown   . Alcohol abuse Other   . Arthritis Other   . Cancer Other        lung cancer  . Hypertension Other   . Arthritis Other   . Stroke Other   . Breast cancer Neg Hx     Social History: Social History   Socioeconomic History  . Marital status: Married    Spouse name: Not on file  . Number of children: Not on file  . Years of education: 76  . Highest education level: Not on file  Occupational History  . Occupation: retired  Scientific laboratory technician  . Financial resource strain: Not on file  . Food insecurity:    Worry: Not on file    Inability: Not on file  . Transportation needs:    Medical: Not on file    Non-medical: Not on file  Tobacco Use  . Smoking status: Current Every Day Smoker    Packs/day: 0.33    Years: 35.00    Pack years: 11.55    Types: Cigarettes  . Smokeless tobacco: Never Used  . Tobacco comment: Smoked 1971-present , up to 1/2 ppd. Intermittent smoking cessation with preganancy up to 1/2-1 year @ a time  Substance and Sexual Activity  . Alcohol use: Yes    Alcohol/week: 1.0 standard drinks    Types: 1 Glasses of wine per week    Comment: 06/14/2014 "glass of wine maybe once/month"  . Drug use: No  . Sexual activity: Never  Lifestyle  . Physical activity:    Days per week: Not on file    Minutes per  session: Not on file  . Stress: Not on file  Relationships  . Social connections:    Talks on phone: Not on file    Gets together: Not on file    Attends religious service: Not on file    Active member of club or organization: Not on  file    Attends meetings of clubs or organizations: Not on file    Relationship status: Not on file  Other Topics Concern  . Not on file  Social History Narrative   Pt lives with husband.    Allergies:  Allergies  Allergen Reactions  . Mupirocin Other (See Comments)    Burning, pain, swelling and sob  . Codeine Nausea Only    Objective:    Vital Signs:   Temp:  [98.2 F (36.8 C)] 98.2 F (36.8 C) (11/19 1216) Pulse Rate:  [65-86] 65 (11/19 1500) Resp:  [14-23] 20 (11/19 1500) BP: (98-105)/(53-69) 98/55 (11/19 1500) SpO2:  [95 %-97 %] 96 % (11/19 1500)    Weight change: There were no vitals filed for this visit.  Intake/Output:  No intake or output data in the 24 hours ending 12/08/17 1514    Physical Exam    General:   No resp difficulty HEENT: normal Neck: supple. JVP flat . Carotids 2+ bilat; no bruits. No lymphadenopathy or thyromegaly appreciated. Cor: PMI nondisplaced. Regular rate & rhythm. No rubs, gallops or murmurs. Lungs: EW LUL  Abdomen: soft, nontender, nondistended. No hepatosplenomegaly. No bruits or masses. Good bowel sounds. Extremities: no cyanosis, clubbing, rash, edema Neuro: alert & orientedx3, cranial nerves grossly intact. moves all 4 extremities w/o difficulty. Affect pleasant   Telemetry   SR PVCs   EKG    SR PVCs 80 pm  Labs   Basic Metabolic Panel: Recent Labs  Lab 12/08/17 1319  NA 136  K 4.0  CL 100  CO2 24  GLUCOSE 109*  BUN 19  CREATININE 1.17*  CALCIUM 9.6    Liver Function Tests: Recent Labs  Lab 12/08/17 1319  AST 18  ALT 12  ALKPHOS 78  BILITOT 1.2  PROT 7.5  ALBUMIN 4.1   No results for input(s): LIPASE, AMYLASE in the last 168 hours. No results for input(s): AMMONIA in the last 168 hours.  CBC: Recent Labs  Lab 12/08/17 1319  WBC 11.4*  NEUTROABS 9.0*  HGB 13.9  HCT 43.6  MCV 93.2  PLT 273    Cardiac Enzymes: No results for input(s): CKTOTAL, CKMB, CKMBINDEX, TROPONINI in the last  168 hours.  BNP: BNP (last 3 results) No results for input(s): BNP in the last 8760 hours.  ProBNP (last 3 results) No results for input(s): PROBNP in the last 8760 hours.   CBG: No results for input(s): GLUCAP in the last 168 hours.  Coagulation Studies: No results for input(s): LABPROT, INR in the last 72 hours.   Imaging   Dg Chest 2 View  Result Date: 12/08/2017 CLINICAL DATA:  Syncopal episode with dizziness and fall. EXAM: CHEST - 2 VIEW COMPARISON:  Radiographs 03/18/2017. FINDINGS: The heart size is stable post median sternotomy. The right subclavian pacemaker leads are unchanged within the right atrium, right ventricle and coronary sinus. There is mild aortic tortuosity. The lungs are hyperinflated but clear. There is no pleural effusion or pneumothorax. No acute osseous findings are evident. Telemetry leads overlie the chest. IMPRESSION: Stable postoperative chest without evidence of acute cardiopulmonary process. Probable emphysema. Electronically Signed   By: Caryl Comes.D.  On: 12/08/2017 13:09      Medications:     Current Medications:   Infusions: . sodium chloride         Patient Profile   Kimberly [name] is a 72 year old with history of VSD repair as a child, COPD, smoker, PAD, [name] CRT-D, and chronic systolic heart failure.   She presented to ED after syncopal episode. Assessment/Plan   1. Syncope Appears dry. Not drinking much fluid at home. Receiving IV fluids.   Stop lasix for now and use for 3 pound weight gain.  [place] interrogation was negative for VT/VF  2. Chronic Systolic Heart Failure  ECHO 07/2017 EF 25-30% . Has [employer] CRT-D Appear dry. Stop lasix.  - Continue entresto, coreg , spironolactone at home dose. Creatinine up a little ut ok.    3. PVC ~11 % burden on interrogation. Refer back to EP. Would no use amio with COPD   4. Smoker Discussed smoking cessation  5. COPD  Severe on PFTs  12/2016   HF follow up 12/23/2017 a 3:00. We will check BMET at that time.  EP will call and set up an appointment.   Check lasix to as needed to 3 pound weight gain in 24 hours.    Medication concerns reviewed with patient and pharmacy team. Barriers identified: none   Length of Stay: 0  Darrick Grinder, NP  12/08/2017, 3:14 PM  Advanced Heart Failure Team Pager [phone] (M-F; 7a - 4p)  Please contact [facility] for night-coverage after hours (4p -7a ) and weekends on [email or web address]  Patient seen with NP, agree with the above note.  She had a syncopal versus near-syncopal episode when standing up from a seated position today.  Device interrogation did not show an arrhythmia that would cause syncope but still shows frequent PVCs (11% burden).  BP stable in the ER. CXR with emphysema.   She is not volume overloaded on exam with JVP not elevated, no edema.  Lungs with bilateral rhonchi, consistent with prior exam.   I suspect event was orthostatic in setting of volume depletion. She has had IV fluid in the ER.  - Stop Lasix, use it only prn weight gain.  - She can continue Entresto and spironolactone for now.  - We will arrange close followup in CHF clinic.   She will also need followup in EP to address frequent PVCs which decrease her BiV pacing percentage.  I do not think she would be a good amiodarone candidate with significant COPD.  Mexiletine would be an option.   Loralie Champagne 12/08/2017 4:48 PM

## 2017-12-08 NOTE — ED Notes (Signed)
Patient transported to X-ray 

## 2017-12-08 NOTE — ED Notes (Signed)
[place] rep reports no episodes were recorded today.

## 2017-12-08 NOTE — ED Notes (Signed)
ED Provider at bedside. 

## 2017-12-08 NOTE — ED Notes (Signed)
Pt's pacemaker interrogated successfully. [employer] will fax results and follow up with a phone call

## 2017-12-08 NOTE — ED Notes (Signed)
This RN attempted to gain IV access and draw labs x2 unsuccessfully. Patient tolerated both attempts well.

## 2017-12-08 NOTE — Discharge Instructions (Signed)
Discontinue your Lasix as indicated by your cardiologist today. Return to ED for worsening symptoms, losing consciousness, chest pain, shortness of breath, leg swelling.

## 2017-12-08 NOTE — ED Provider Notes (Signed)
[facility] EMERGENCY DEPARTMENT Provider Note   CSN: [id number] Arrival date & time: 12/08/17  1206     History   Chief Complaint Chief Complaint  Patient presents with  . Loss of Consciousness    HPI Kimberly [name] is a 72 y.o. female with a past medical history of CHF (last ECHO 10/2017 showing EF 35-40%), COPD, CAD, hypertension, AICD present, prior MI who presents to ED for evaluation of dizziness, lightheadedness and near syncope that occurred prior to arrival.  States that she stood up after watching TV on her way to the bathroom when she felt like the room was spinning around her.  She then fell to the floor because she felt like she could not walk due to the dizziness.  She did is unsure of any head injury or loss of consciousness.  States that she has had palpitations for the past several days.  She has a chronic cough and diarrhea but no changes from baseline.  She reports compliance with all of her home medications.  She does endorse decreased appetite over the past several months but does eat when she takes her medications.  Denies any fever, abdominal pain, chest pain, headache, vision changes, numbness in arms or legs.  HPI  Past Medical History:  Diagnosis Date  . AICD (automatic cardioverter/defibrillator) present 03/17/2017  . Anxiety   . Atrial tachycardia ([place])   . Bursitis of shoulder, right, adhesive   . CHF (congestive heart failure) ([place])   . Chronic bronchitis ([place])    "1-2 times/yr" (01/23/2014)  . COPD (chronic obstructive pulmonary disease) ([place])   . CVD (cerebrovascular disease)   . Dyslipidemia   . Dysrhythmia   . Frequency of urination   . GERD (gastroesophageal reflux disease)   . Heart murmur   . History of stomach ulcers   . HTN (hypertension) 02/22/2011  . Migraines    "stopped many years ago" (06/14/2014)  . Osteoporosis 08/19/2016  . Pericarditis   . Pneumonia "10 times" (06/14/2014)  . Right ventricular outflow tract  premature ventricular contractions (PVCs)   . Silent myocardial infarction ([place]) "late 1990's"  . Stress incontinence    "was suppose to have been tacked up years ago but I didn't do it"  . Syncope, near    Associated with atrial tachycardia-event recorder 1/16  . Thoracic outlet syndrome   . VSD (ventricular septal defect)     Patient Active Problem List   Diagnosis Date Noted  . Orthostatic syncope   . Abscess of right leg 09/24/2016  . Left leg cellulitis 08/26/2016  . Chronic systolic CHF (congestive heart failure) ([place]) 08/26/2016  . Osteoporosis 08/19/2016  . Degenerative arthritis of right knee 08/18/2016  . Easy bruising 07/24/2016  . Right knee pain 07/24/2016  . Varicose veins of both lower extremities 07/24/2016  . Laceration of right lower leg 07/24/2016  . Multifocal pneumonia 05/06/2016  . Constipation 04/10/2016  . Allergic rhinitis 03/22/2016  . Depression 01/03/2016  . Acute sinus infection 06/28/2014  . SVT (supraventricular tachycardia) ([place]) 06/14/2014  . Syncope and collapse 01/23/2014  . VT (ventricular tachycardia) reported on Holter Monitor 01/23/2014  . Hearing loss, right 06/29/2013  . COPD exacerbation ([place]) 09/13/2012  . Dilated cardiomyopathy ([place]) 02/11/2012  . Volume overload 02/11/2012  . Migraine 02/26/2011  . Chronic neck pain 02/26/2011  . Cough 02/26/2011  . Preventative health care 02/22/2011  . Anxiety 02/22/2011  . GERD (gastroesophageal reflux disease) 02/22/2011  . Pericarditis 02/22/2011  .  Thoracic outlet syndrome 02/22/2011  . HTN (hypertension) 02/22/2011  . FATIGUE 05/31/2009  . CHEST PAIN UNSPECIFIED 05/31/2009  . Dyslipidemia 11/13/2008  . TOBACCO ABUSE 11/13/2008  . PVD 11/13/2008  . COPD (chronic obstructive pulmonary disease) ([place]) 11/13/2008  . VSD- s/p repair x 2 11/13/2008    Past Surgical History:  Procedure Laterality Date  . ABDOMINAL AORTOGRAM W/LOWER EXTREMITY N/A 12/17/2016   Procedure: ABDOMINAL  AORTOGRAM W/LOWER EXTREMITY;  Surgeon: Wellington Hampshire, MD;  Location: [facility];  Service: Cardiovascular;  Laterality: N/A;  . BIV ICD INSERTION CRT-D N/A 03/17/2017   Procedure: BIV ICD INSERTION CRT-D;  Surgeon: Evans Lance, MD;  Location: [place] [facility];  Service: Cardiovascular;  Laterality: N/A;  . CARDIAC CATHETERIZATION  "quite a few"  . [place]  . CHOLECYSTECTOMY OPEN  1970's  . CORONARY ANGIOGRAM  2000   No significant CAD  . ELECTROPHYSIOLOGIC STUDY N/A 06/14/2014   Procedure: A-Flutter/A-Tach/SVT Ablation;  Surgeon: Evans Lance, MD;  Location: [place] [facility];  Service: Cardiovascular;  Laterality: N/A;  . INSERTION OF ICD  03/17/2017   BIV  . MYRINGOTOMY WITH TUBE PLACEMENT Right 2015  . PERIPHERAL VASCULAR INTERVENTION  12/17/2016   Procedure: PERIPHERAL VASCULAR INTERVENTION;  Surgeon: Wellington Hampshire, MD;  Location: [facility];  Service: Cardiovascular;;  Right common femoral PTA and Stent  . RIGHT/LEFT HEART CATH AND CORONARY ANGIOGRAPHY Bilateral 11/06/2016   Procedure: RIGHT/LEFT HEART CATH AND CORONARY ANGIOGRAPHY;  Surgeon: Wellington Hampshire, MD;  Location: [place] [facility];  Service: Cardiovascular;  Laterality: Bilateral;  . SVT ABLATION  06/14/2014  . TUBAL LIGATION  1972  . VSD REPAIR  1958; 1967     OB History   None      Home Medications    Prior to Admission medications   Medication Sig Start Date End Date Taking? Authorizing Provider  albuterol (VENTOLIN HFA) 108 (90 Base) MCG/ACT inhaler Inhale 2 puffs into the lungs every 6 (six) hours as needed for wheezing or shortness of breath. 10/27/17  Yes Biagio Borg, MD  ALPRAZolam Duanne Moron) 0.25 MG tablet TAKE 1 TABLET BY MOUTH TWICE A DAY AS NEEDED FOR ANXIETY Patient taking differently: Take 0.25 mg by mouth at bedtime as needed for sleep.  11/24/17  Yes Biagio Borg, MD  aspirin 81 MG tablet Take 1 tablet (81 mg total) by mouth daily. 07/24/16  Yes Biagio Borg, MD  carvedilol (COREG) 3.125 MG tablet Take 2 tablets (6.25 mg total) by mouth 2 (two) times daily. 10/13/17 10/13/18 Yes Larey Dresser, MD  celecoxib (CELEBREX) 100 MG capsule Take 100 mg by mouth 2 (two) times daily as needed for moderate pain.   Yes [provider]  colchicine 0.6 MG tablet Take 1 tablet TID until GI upset or flare subsides, then for maintenance, take 1 tablet daily. 05/18/17  Yes Hyatt, Max T, DPM  digoxin 62.5 MCG TABS Take 0.0625 mg by mouth daily. 10/06/17  Yes Larey Dresser, MD  ENTRESTO 24-26 MG TAKE 1 TABLET BY MOUTH TWICE A DAY Patient taking differently: Take 1 tablet by mouth 2 (two) times daily.  07/07/17  Yes Larey Dresser, MD  furosemide (LASIX) 20 MG tablet Take 1 tablet (20 mg total) by mouth every other day. IN THE MORNING. 10/06/17  Yes Larey Dresser, MD  ibuprofen (ADVIL,MOTRIN) 200 MG tablet Take 200 mg by mouth daily as needed for headache.  Yes [provider]  rosuvastatin (CRESTOR) 10 MG tablet TAKE 1 TABLET BY MOUTH EVERY DAY 09/23/17  Yes Wellington Hampshire, MD  spironolactone (ALDACTONE) 25 MG tablet Take 1 tablet (25 mg total) by mouth daily. 09/29/17  Yes Larey Dresser, MD  chlorpheniramine-HYDROcodone [facility] PENNKINETIC ER) 10-8 MG/5ML SUER Take 5 mLs by mouth every 12 (twelve) hours as needed for cough. Patient not taking: Reported on 12/08/2017 10/30/17   Janith Lima, MD  varenicline (CHANTIX CONTINUING MONTH PAK) 1 MG tablet Take 1 tablet (1 mg total) by mouth 2 (two) times daily. 12/30/17   Larey Dresser, MD  varenicline (CHANTIX STARTING MONTH PAK) 0.5 MG X 11 & 1 MG X 42 tablet Take one 0.5 mg tablet by mouth once daily for 3 days, then increase to one 0.5 mg tablet twice daily for 4 days, then increase to one 1 mg tablet twice daily. 11/30/17   Larey Dresser, MD    Family History Family History  Problem Relation Age of Onset  . Cancer Mother   . Cancer Father   . Throat cancer Unknown   .  Hypertension Unknown   . Stroke Unknown   . Alcohol abuse Other   . Arthritis Other   . Cancer Other        lung cancer  . Hypertension Other   . Arthritis Other   . Stroke Other   . Breast cancer Neg Hx     Social History Social History   Tobacco Use  . Smoking status: Current Every Day Smoker    Packs/day: 0.33    Years: 35.00    Pack years: 11.55    Types: Cigarettes  . Smokeless tobacco: Never Used  . Tobacco comment: Smoked 1971-present , up to 1/2 ppd. Intermittent smoking cessation with preganancy up to 1/2-1 year @ a time  Substance Use Topics  . Alcohol use: Yes    Alcohol/week: 1.0 standard drinks    Types: 1 Glasses of wine per week    Comment: 06/14/2014 "glass of wine maybe once/month"  . Drug use: No     Allergies   Mupirocin and Codeine   Review of Systems Review of Systems  Constitutional: Negative for appetite change, chills and fever.  HENT: Negative for ear pain, rhinorrhea, sneezing and sore throat.   Eyes: Negative for photophobia and visual disturbance.  Respiratory: Negative for cough, chest tightness, shortness of breath and wheezing.   Cardiovascular: Positive for palpitations. Negative for chest pain.  Gastrointestinal: Negative for abdominal pain, blood in stool, constipation, diarrhea, nausea and vomiting.  Genitourinary: Negative for dysuria, hematuria and urgency.  Musculoskeletal: Negative for myalgias.  Skin: Negative for rash.  Neurological: Positive for dizziness and light-headedness. Negative for weakness.     Physical Exam Updated Vital Signs BP 116/68   Pulse 79   Temp 98.2 F (36.8 C) (Oral)   Resp (!) 21   SpO2 97%   Physical Exam  Constitutional: She is oriented to person, place, and time. She appears well-developed and well-nourished. No distress.  HENT:  Head: Normocephalic and atraumatic.  Nose: Nose normal.  Eyes: Pupils are equal, round, and reactive to light. Conjunctivae and EOM are normal. Right eye  exhibits no discharge. Left eye exhibits no discharge. No scleral icterus.  Neck: Normal range of motion. Neck supple.  Cardiovascular: Normal rate, regular rhythm, normal heart sounds and intact distal pulses. Exam reveals no gallop and no friction rub.  No murmur heard. Pulmonary/Chest: Effort normal  and breath sounds normal. No respiratory distress.  No lower extremity edema, erythema or calf tenderness bilaterally.  Abdominal: Soft. Bowel sounds are normal. She exhibits no distension. There is no tenderness. There is no guarding.  Musculoskeletal: Normal range of motion. She exhibits no edema.  Neurological: She is alert and oriented to person, place, and time. No cranial nerve deficit or sensory deficit. She exhibits normal muscle tone. Coordination normal.  Pupils reactive. No facial asymmetry noted. Cranial nerves appear grossly intact. Sensation intact to light touch on face, BUE and BLE. Strength 5/5 in BUE and BLE.   Skin: Skin is warm and dry. No rash noted.  Psychiatric: She has a normal mood and affect.  Nursing note and vitals reviewed.    ED Treatments / Results  Labs (all labs ordered are listed, but only abnormal results are displayed) Labs Reviewed  COMPREHENSIVE METABOLIC PANEL - Abnormal; Notable for the following components:      Result Value   Glucose, Bld 109 (*)    Creatinine, Ser 1.17 (*)    GFR calc non Af Amer 46 (*)    GFR calc Af Amer 53 (*)    All other components within normal limits  CBC WITH DIFFERENTIAL/PLATELET - Abnormal; Notable for the following components:   WBC 11.4 (*)    Neutro Abs 9.0 (*)    All other components within normal limits  URINALYSIS, ROUTINE W REFLEX MICROSCOPIC - Abnormal; Notable for the following components:   Color, Urine STRAW (*)    Hgb urine dipstick MODERATE (*)    All other components within normal limits  DIGOXIN LEVEL - Abnormal; Notable for the following components:   Digoxin Level 0.3 (*)    All other components  within normal limits  URINE CULTURE  I-STAT TROPONIN, ED  I-STAT TROPONIN, ED    EKG EKG Interpretation  Date/Time:  Tuesday December 08 2017 12:18:57 EST Ventricular Rate:  80 PR Interval:    QRS Duration: 152 QT Interval:  402 QTC Calculation: 464 R Axis:   -37 Text Interpretation:  Sinus rhythm Ventricular premature complex Probable left atrial enlargement Right bundle branch block No significant change since last tracing Confirmed by Deno Etienne [phone]) on 12/08/2017 12:29:06 PM   Radiology Dg Chest 2 View  Result Date: 12/08/2017 CLINICAL DATA:  Syncopal episode with dizziness and fall. EXAM: CHEST - 2 VIEW COMPARISON:  Radiographs 03/18/2017. FINDINGS: The heart size is stable post median sternotomy. The right subclavian pacemaker leads are unchanged within the right atrium, right ventricle and coronary sinus. There is mild aortic tortuosity. The lungs are hyperinflated but clear. There is no pleural effusion or pneumothorax. No acute osseous findings are evident. Telemetry leads overlie the chest. IMPRESSION: Stable postoperative chest without evidence of acute cardiopulmonary process. Probable emphysema. Electronically Signed   By: Richardean Sale M.D.   On: 12/08/2017 13:09    Procedures Procedures (including critical care time)  Medications Ordered in ED Medications  sodium chloride 0.9 % bolus 500 mL (0 mLs Intravenous Stopped 12/08/17 1617)     Initial Impression / Assessment and Plan / ED Course  I have reviewed the triage vital signs and the nursing notes.  Pertinent labs & imaging results that were available during my care of the patient were reviewed by me and considered in my medical decision making (see chart for details).     35-year-old female with a past medical history of CHF last EF 35 to 40% last month, COPD, CAD, hypertension, AICD present  presents to ED for evaluation of dizziness, lightheadedness and near syncope that occurred prior to arrival.   States that she stood up quickly after watching TV on her way to her bathroom when she felt like the room was spinning around her.  She does note history of palpitations over the past several days and decreased appetite for the past several months.  She reports compliance with her home medications.  On exam there are no neurological deficits noted.  No C, T or L-spine tenderness to palpation noted.  She is alert and oriented x4.  She is afebrile.  Other vital signs within normal limits.  Lab work significant for low digoxin level at 0.3 which appears similar to priors, mild leukocytosis of 11, creatinine of 1.17.  Initial and delta troponin are negative.  EKG shows no significant change from prior tracings.  Chest x-ray shows stable emphysema.  Patient was given a fluid bolus here with significant improvement in her symptoms.  AICD interrogation was negative for VT/VF.  Cardiology evaluated the patient and recommends that she discontinue her Lasix and to stay hydrated.  States that she is clear from a cardiology standpoint for discharge.  Please see their note for further detail.  Patient is requesting discharge home as well.  She is ambulatory here.  Suspect that her symptoms were due to dehydration.  Doubt neurological cause such as CVA as patient has no deficits.  Will advise her to return to ED for any severe worsening symptoms.  Patient is hemodynamically stable, in NAD, and able to ambulate in the ED. Evaluation does not show pathology that would require ongoing emergent intervention or inpatient treatment. I explained the diagnosis to the patient. Pain has been managed and has no complaints prior to discharge. Patient is comfortable with above plan and is stable for discharge at this time. All questions were answered prior to disposition. Strict return precautions for returning to the ED were discussed. Encouraged follow up with PCP.    Portions of this note were generated with [employer]obby[employer].  Dictation errors may occur despite best attempts at proofreading.   Final Clinical Impressions(s) / ED Diagnoses   Final diagnoses:  Dehydration    ED Discharge Orders    None       Delia Heady, PA-C 12/08/17 [place], [place], DO 12/09/17 [phone]

## 2017-12-08 NOTE — Telephone Encounter (Signed)
Pt called stating she was feeling short of breath, dizzy and fell twice. She wanted to know if she should go to the ER. I asked the device tech Nurse [phone] and she stated the pt should go to the emergency room

## 2017-12-08 NOTE — ED Triage Notes (Signed)
Pt states that she stood up from the couch to go to the bathroom and became dizzy, diaphoretic, and nauseated. Pt states that she ended up on the floor in her hallway, but does not remember how she got to the floor. Pt only remembers her husband helping her up from the floor post fall.

## 2017-12-09 LAB — URINE CULTURE: Culture: NO GROWTH

## 2017-12-15 ENCOUNTER — Other Ambulatory Visit (HOSPITAL_COMMUNITY): Payer: Self-pay | Admitting: Cardiology

## 2017-12-16 ENCOUNTER — Ambulatory Visit (INDEPENDENT_AMBULATORY_CARE_PROVIDER_SITE_OTHER): Payer: Medicare Other

## 2017-12-16 DIAGNOSIS — I5022 Chronic systolic (congestive) heart failure: Secondary | ICD-10-CM

## 2017-12-16 DIAGNOSIS — I420 Dilated cardiomyopathy: Secondary | ICD-10-CM

## 2017-12-16 NOTE — Progress Notes (Signed)
Remote ICD transmission.   

## 2017-12-22 ENCOUNTER — Other Ambulatory Visit: Payer: Self-pay | Admitting: Internal Medicine

## 2017-12-23 ENCOUNTER — Ambulatory Visit (HOSPITAL_COMMUNITY)
Admission: RE | Admit: 2017-12-23 | Discharge: 2017-12-23 | Disposition: A | Discharge status: Home / Self Care | Payer: Medicare Other | Source: Ambulatory Visit | Attending: Internal Medicine | Admitting: Internal Medicine

## 2017-12-23 ENCOUNTER — Encounter (HOSPITAL_COMMUNITY): Payer: Self-pay

## 2017-12-23 VITALS — BP 122/58 | HR 68 | Wt 107.4 lb

## 2017-12-23 DIAGNOSIS — I5022 Chronic systolic (congestive) heart failure: Secondary | ICD-10-CM | POA: Diagnosis not present

## 2017-12-23 DIAGNOSIS — I428 Other cardiomyopathies: Secondary | ICD-10-CM | POA: Insufficient documentation

## 2017-12-23 DIAGNOSIS — Z79899 Other long term (current) drug therapy: Secondary | ICD-10-CM | POA: Insufficient documentation

## 2017-12-23 DIAGNOSIS — Z9889 Other specified postprocedural states: Secondary | ICD-10-CM | POA: Insufficient documentation

## 2017-12-23 DIAGNOSIS — Z791 Long term (current) use of non-steroidal anti-inflammatories (NSAID): Secondary | ICD-10-CM | POA: Insufficient documentation

## 2017-12-23 DIAGNOSIS — I480 Paroxysmal atrial fibrillation: Secondary | ICD-10-CM | POA: Diagnosis not present

## 2017-12-23 DIAGNOSIS — I2510 Atherosclerotic heart disease of native coronary artery without angina pectoris: Secondary | ICD-10-CM | POA: Diagnosis not present

## 2017-12-23 DIAGNOSIS — F17200 Nicotine dependence, unspecified, uncomplicated: Secondary | ICD-10-CM | POA: Insufficient documentation

## 2017-12-23 DIAGNOSIS — E785 Hyperlipidemia, unspecified: Secondary | ICD-10-CM | POA: Diagnosis not present

## 2017-12-23 DIAGNOSIS — J449 Chronic obstructive pulmonary disease, unspecified: Secondary | ICD-10-CM | POA: Insufficient documentation

## 2017-12-23 DIAGNOSIS — Z8249 Family history of ischemic heart disease and other diseases of the circulatory system: Secondary | ICD-10-CM | POA: Insufficient documentation

## 2017-12-23 DIAGNOSIS — I739 Peripheral vascular disease, unspecified: Secondary | ICD-10-CM | POA: Insufficient documentation

## 2017-12-23 DIAGNOSIS — I340 Nonrheumatic mitral (valve) insufficiency: Secondary | ICD-10-CM | POA: Insufficient documentation

## 2017-12-23 DIAGNOSIS — Z7982 Long term (current) use of aspirin: Secondary | ICD-10-CM | POA: Insufficient documentation

## 2017-12-23 DIAGNOSIS — I493 Ventricular premature depolarization: Secondary | ICD-10-CM | POA: Diagnosis not present

## 2017-12-23 DIAGNOSIS — R011 Cardiac murmur, unspecified: Secondary | ICD-10-CM | POA: Insufficient documentation

## 2017-12-23 LAB — BASIC METABOLIC PANEL
ANION GAP: 9 (ref 5–15)
BUN: 13 mg/dL (ref 8–23)
CALCIUM: 9 mg/dL (ref 8.9–10.3)
CO2: 30 mmol/L (ref 22–32)
CREATININE: 0.82 mg/dL (ref 0.44–1.00)
Chloride: 98 mmol/L (ref 98–111)
GFR calc Af Amer: >60 mL/min (ref >60)
GLUCOSE: 81 mg/dL (ref 70–99)
Potassium: 4.4 mmol/L (ref 3.5–5.1)
Sodium: 137 mmol/L (ref 135–145)

## 2017-12-23 LAB — BRAIN NATRIURETIC PEPTIDE: B Natriuretic Peptide: 185 pg/mL — ABNORMAL HIGH (ref 0.0–100.0)

## 2017-12-23 MED ORDER — FUROSEMIDE 20 MG PO TABS: ORAL_TABLET | ORAL | 11 refills | Status: DC

## 2017-12-23 NOTE — Patient Instructions (Signed)
Take Lasix 20 mg daily for 2 days, then take once a week on Mondays thereafter  Labs today We will only contact you if something comes back abnormal or we need to make some changes. Otherwise no news is good news!  Your physician recommends that you schedule a follow-up appointment in: 4 weeks  in the [facility]   Your physician recommends that you schedule a follow-up appointment in: 3 months with Dr Aundra Dubin  Do the following things EVERYDAY: 1) Weigh yourself in the morning before breakfast. Write it down and keep it in a log. 2) Take your medicines as prescribed 3) Eat low salt foods-Limit salt (sodium) to 2000 mg per day.  4) Stay as active as you can everyday 5) Limit all fluids for the day to less than 2 liters   At the [facility], you and your health needs are our priority. As part of our continuing mission to provide you with exceptional heart care, we have created designated Provider Care Teams. These Care Teams include your primary Cardiologist (physician) and Advanced Practice Providers (APPs- Physician Assistants and Nurse Practitioners) who all work together to provide you with the care you need, when you need it.   You may see any of the following providers on your designated Care Team at your next follow up: Marland Kitchen Dr Glori Bickers . Dr Loralie Champagne . Darrick Grinder, NP . Lillia Mountain, NP . Rebecca Eaton

## 2017-12-23 NOTE — Progress Notes (Signed)
PCP: Dr. Jenny Reichmann Cardiology: Dr. Harrington Challenger HF Cardiology: Dr. Aundra Dubin Pulmonary: Dr Melvyn Novas   72 y.o.with history of VSD repair as a child, COPD/active smoking, PAD, and chronic systolic CHF was referred by Dr. Harrington Challenger for evaluation of CHF.   Patient has a long history of cardiomyopathy.  She had VSD repaired as a child and imaging has not showed residual VSD.  Cardiac MRI in 11/09 showed EF 36%.  Echo in 12/15 showed EF 40-45%.  Echo in 3/18 showed EF down to 15-20% with moderate RV systolic dysfunction.  RHC/LHC in 10/18 showed nonobstructive coronary but was concerning for low output (CI 1.33).  She had a right lower leg slowly healing ulcer.  She had peripheral arteriogram with bilateral occluded CFAs. She had PCI to right CFA.    CPX in 1/19 showed moderate to severe functional limitation, combination of HF and lung disease, probably more related to the lung disease.  PFTs in 12/18 showed severe COPD.   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    7/19 peripheral arterial dopplers showed significant in-stent restenosis right SFA stent.  Today she returns for HF follow up due to increased shortness of breath. Since the last visit she was evaluated in [facility] on 11/19 with syncope.  Heart Logic score was 7. She was dry so lasix was stopped and changed to as needed for 3 pound weight gain.  Overall feeling ok. Having fatigue completing house work. SOB with exertion. No chest pain. Denies PND/Orthopnea. Appetite ok. Eating high salt food and had french fries on the way to the clinic. Drinking lots of fluids. Continues to smoke. No fever or chills. Weight at home has been trending up 103-->107 pounds. Taking all medications.   [name] device interrogation: Heart Logic Score 20.   Labs (7/18): LDL 61 Labs (11/18): K 4.2, creatinine 0.7, hgb 13.7 Labs (1/19): K 4.3, creatinine 1.07 Labs (2/19): K 4.5, creatinine 1.14 Labs (7/19): K 4.4, creatinine 1.04, LDL 61, digoxin 1.2 Labs (9/19): digoxin  0.7, K 4.8, creatinine 0.93  PMH: 1. VSD: s/p repair at [facility] as child.  From description, sounds like muscular VSD.  2. Atrial tachycardia s/p ablation.  3. Hyperlipidemia 4. COPD: Active smoker.  - PFTs (12/18): Severe obstructive lung disease.  5. Atrial fibrillation: Paroxysmal.   Noted only transiently.   6. PAD: ABIs 11/18 with 0.62 on right, 0.72 on left.  - Angiogram 11/18 showed totally occluded bilateral CFAs.  Patient had stent to right CFA.  ABIs in 12/18 were normal on right. - Peripheral arterial dopplers (7/19) with significant in-stent restenosis right CFA.   7. Chronic systolic CHF: Nonischemic cardiomyopathy.   - cMRI (11/09): EF 36%, VSD patch in anterior ventricular septum - Echo (12/15): EF 40-45% - Echo (3/18): EF 15-20%, moderate LV dilation, moderate MR, moderate RV dilation with moderately decreased systolic function.  - LHC/RHC (10/18): 3+ MR, EF < 25%, minimal nonobstructive CAD; PA 41/21, LVEDP 18, CI 1.33.  - CPX (1/19): peak VO2 13.4, VE/VCO2 slope 43, RER 1.07.  Mod-severe functional limitation due to combination of HF and lung disease, probably more related to lung disease.  - [name] CRT-D device.  - Echo (7/19): EF 25-30%, mild LV dilation, mild MR, normal RV size and systolic function.  8. PVCs: Zio patch 9/19 with 6% PVCs.   SH: Active smoker, lives in [place], married.  FH: Mother with PAD, sister died at birth from congenital heart disease.   ROS: All systems reviewed and negative  except as per HPI.   Current Outpatient Medications  Medication Sig Dispense Refill  . albuterol (VENTOLIN HFA) 108 (90 Base) MCG/ACT inhaler Inhale 2 puffs into the lungs every 6 (six) hours as needed for wheezing or shortness of breath. 18 g 11  . ALPRAZolam (XANAX) 0.25 MG tablet TAKE 1 TABLET BY MOUTH TWICE A DAY AS NEEDED FOR ANXIETY (Patient taking differently: Take 0.25 mg by mouth at bedtime as needed for sleep. ) 60 tablet 2  . aspirin 81 MG tablet  Take 1 tablet (81 mg total) by mouth daily. 100 tablet 99  . carvedilol (COREG) 3.125 MG tablet Take 2 tablets (6.25 mg total) by mouth 2 (two) times daily. 360 tablet 3  . celecoxib (CELEBREX) 100 MG capsule Take 100 mg by mouth 2 (two) times daily as needed for moderate pain.    . chlorpheniramine-HYDROcodone (TUSSIONEX) 10-8 MG/5ML SUER TAKE 5 ML BY MOUTH EVERY 12 HOURS AS NEEDED FOR COUGH 115 mL 0  . colchicine 0.6 MG tablet Take 1 tablet TID until GI upset or flare subsides, then for maintenance, take 1 tablet daily. 90 tablet 3  . digoxin 62.5 MCG TABS Take 0.0625 mg by mouth daily. 90 tablet 3  . ENTRESTO 24-26 MG TAKE 1 TABLET BY MOUTH TWICE A DAY 60 tablet 3  . ibuprofen (ADVIL,MOTRIN) 200 MG tablet Take 200 mg by mouth daily as needed for headache.    . rosuvastatin (CRESTOR) 10 MG tablet TAKE 1 TABLET BY MOUTH EVERY DAY 90 tablet 1  . spironolactone (ALDACTONE) 25 MG tablet Take 1 tablet (25 mg total) by mouth daily. 30 tablet 3  . [START ON 12/30/2017] varenicline (CHANTIX CONTINUING MONTH PAK) 1 MG tablet Take 1 tablet (1 mg total) by mouth 2 (two) times daily. 60 tablet 6  . varenicline (CHANTIX STARTING MONTH PAK) 0.5 MG X 11 & 1 MG X 42 tablet Take one 0.5 mg tablet by mouth once daily for 3 days, then increase to one 0.5 mg tablet twice daily for 4 days, then increase to one 1 mg tablet twice daily. 53 tablet 0   No current facility-administered medications for this encounter.    BP (!) 122/58   Pulse 68   Wt 48.7 kg (107 lb 6.4 oz)   SpO2 97%   BMI 20.29 kg/m   . Wt Readings from Last 3 Encounters:  12/23/17 48.7 kg (107 lb 6.4 oz)  11/30/17 48.8 kg (107 lb 8 oz)  10/27/17 48.5 kg (107 lb)    General:  Appears chronically ill. No resp difficulty HEENT: normal Neck: supple. JVP ~10 . Carotids 2+ bilat; no bruits. No lymphadenopathy or thryomegaly appreciated. Cor: PMI nondisplaced. Regular rate & rhythm. No rubs, gallops or murmurs. Lungs: clear Abdomen: soft,  nontender, nondistended. No hepatosplenomegaly. No bruits or masses. Good bowel sounds. Extremities: no cyanosis, clubbing, rash, edema Neuro: alert & orientedx3, cranial nerves grossly intact. moves all 4 extremities w/o difficulty. Affect pleasant  Assessment/Plan: 1.  PAD: She had PCI to occluded right CFA in 11/18, but 7/19 peripheral arterial dopplers showed significant in-stent restenosis in the right CFA. She has occluded left CFA also that was not intervened on. Mild claudication, no pedal ulcers.  - Discussed smoking cessation. Needs to stop smoking.  - Continue statin.  - Follows with Dr. Fletcher Anon.  2. Smoking:Discussed smoking cessation.   3. CAD: Nonobstructive on 10/18 cath. No chest pain.  - She is on ASA 81 and statin.  4. Hyperlipidemia: Good lipids in  7/19.  Continue Crestor.   5. COPD: Severe by 12/18 PFTs. COPD plays a significant role in her dyspnea. Hall walk was done today, oxygen saturation did not drop below 90% so she will not qualify for home oxygen.  6. Chronic systolic CHF: Nonischemic cardiomyopathy.  RHC/LHC in 10/18 showed no nonobstructive coronary disease but CI was very low at 1.33.  Her low cardiac output was out of proportion to her symptoms.  [name] CRT-D device.  CPX suggested moderate-severe functional limitation, but probably more due to COPD than CHF.  Most recent echo in 7/19 with EF 25-30%.   Heart Logic score up to 20. NYHA IIIb. Bi V pacing 88%.  - Volume status elevated. Instructed to take 20 mg lasix for the next 2 days then start 20 mg every Monday.   - Continue Entresto 24/26 bid.  - Continue Coreg 3.125 mg bid.  - Continue spironolactone 25 mg daily.  - Continue digoxin, dig level 0.3 12/08/2017   -  LVAD would be a difficult proposition for her given severity of lung disease.   7. Mitral regurgitation: Only mild on most recent echo though her murmur is prominent.   Follow up 4 weeks with APP and 3 months with Dr Aundra Dubin. Discussed low  salt food choices and limiting fluids to 2 liters per day.  Check BMEt and BNP today.   Jamarian Jacinto NP-C  12/23/2017

## 2017-12-30 ENCOUNTER — Encounter: Payer: Self-pay | Admitting: Internal Medicine

## 2017-12-30 ENCOUNTER — Ambulatory Visit (INDEPENDENT_AMBULATORY_CARE_PROVIDER_SITE_OTHER): Payer: Medicare Other | Admitting: Internal Medicine

## 2017-12-30 VITALS — BP 124/64 | HR 70 | Ht 61.0 in | Wt 104.0 lb

## 2017-12-30 DIAGNOSIS — I5022 Chronic systolic (congestive) heart failure: Secondary | ICD-10-CM

## 2017-12-30 DIAGNOSIS — Z95810 Presence of automatic (implantable) cardiac defibrillator: Secondary | ICD-10-CM | POA: Diagnosis not present

## 2017-12-30 DIAGNOSIS — I420 Dilated cardiomyopathy: Secondary | ICD-10-CM | POA: Diagnosis not present

## 2017-12-30 NOTE — Patient Instructions (Signed)
Medication Instructions:  Your physician recommends that you continue on your current medications as directed. Please refer to the Current Medication list given to you today.  Labwork: None ordered.  Testing/Procedures: None ordered.  Follow-Up: Your physician wants you to follow-up in: one year with Dr. Lovena Le.   You will receive a reminder letter in the mail two months in advance. If you don't receive a letter, please call our office to schedule the follow-up appointment.  Remote monitoring is used to monitor your ICD from home. This monitoring reduces the number of office visits required to check your device to one time per year. It allows [place] to keep an eye on the functioning of your device to ensure it is working properly. You are scheduled for a device check from home on 03/17/2018. You may send your transmission at any time that day. If you have a wireless device, the transmission will be sent automatically. After your physician reviews your transmission, you will receive a postcard with your next transmission date.  Any Other Special Instructions Will Be Listed Below (If Applicable).  If you need a refill on your cardiac medications before your next appointment, please call your pharmacy.

## 2017-12-30 NOTE — Progress Notes (Signed)
HPI Kimberly [name] returns today for followup of CHF, HTN, syncope, s/p Biv ICD insertion. She has done well since her last visit. After her ICD insertion, she had a prolonged period of healing. She denies fever or chills. She is still smoking. She has fairly significant COPD and desaturates with exertion. Her CHF symptoms are controlled.  Allergies  Allergen Reactions  . Mupirocin Other (See Comments)    Burning, pain, swelling and sob  . Codeine Nausea Only     Current Outpatient Medications  Medication Sig Dispense Refill  . albuterol (VENTOLIN HFA) 108 (90 Base) MCG/ACT inhaler Inhale 2 puffs into the lungs every 6 (six) hours as needed for wheezing or shortness of breath. 18 g 11  . ALPRAZolam (XANAX) 0.25 MG tablet TAKE 1 TABLET BY MOUTH TWICE A DAY AS NEEDED FOR ANXIETY (Patient taking differently: Take 0.25 mg by mouth at bedtime as needed for sleep. ) 60 tablet 2  . aspirin 81 MG tablet Take 1 tablet (81 mg total) by mouth daily. 100 tablet 99  . carvedilol (COREG) 3.125 MG tablet Take 2 tablets (6.25 mg total) by mouth 2 (two) times daily. 360 tablet 3  . celecoxib (CELEBREX) 100 MG capsule Take 100 mg by mouth 2 (two) times daily as needed for moderate pain.    . chlorpheniramine-HYDROcodone (TUSSIONEX) 10-8 MG/5ML SUER TAKE 5 ML BY MOUTH EVERY 12 HOURS AS NEEDED FOR COUGH 115 mL 0  . colchicine 0.6 MG tablet Take 1 tablet TID until GI upset or flare subsides, then for maintenance, take 1 tablet daily. 90 tablet 3  . digoxin 62.5 MCG TABS Take 0.0625 mg by mouth daily. 90 tablet 3  . ENTRESTO 24-26 MG TAKE 1 TABLET BY MOUTH TWICE A DAY 60 tablet 3  . furosemide (LASIX) 20 MG tablet Take 1 tablet (20 mg total) by mouth daily for 2 days, THEN 1 tablet (20 mg total) once a week. 30 tablet 11  . ibuprofen (ADVIL,MOTRIN) 200 MG tablet Take 200 mg by mouth daily as needed for headache.    . rosuvastatin (CRESTOR) 10 MG tablet TAKE 1 TABLET BY MOUTH EVERY DAY 90 tablet 1  .  spironolactone (ALDACTONE) 25 MG tablet Take 1 tablet (25 mg total) by mouth daily. 30 tablet 3  . varenicline (CHANTIX CONTINUING MONTH PAK) 1 MG tablet Take 1 tablet (1 mg total) by mouth 2 (two) times daily. 60 tablet 6  . varenicline (CHANTIX STARTING MONTH PAK) 0.5 MG X 11 & 1 MG X 42 tablet Take one 0.5 mg tablet by mouth once daily for 3 days, then increase to one 0.5 mg tablet twice daily for 4 days, then increase to one 1 mg tablet twice daily. 53 tablet 0   No current facility-administered medications for this visit.      Past Medical History:  Diagnosis Date  . AICD (automatic cardioverter/defibrillator) present 03/17/2017  . Anxiety   . Atrial tachycardia ([place])   . Bursitis of shoulder, right, adhesive   . CHF (congestive heart failure) ([place])   . Chronic bronchitis ([place])    "1-2 times/yr" (01/23/2014)  . COPD (chronic obstructive pulmonary disease) ([place])   . CVD (cerebrovascular disease)   . Dyslipidemia   . Dysrhythmia   . Frequency of urination   . GERD (gastroesophageal reflux disease)   . Heart murmur   . History of stomach ulcers   . HTN (hypertension) 02/22/2011  . Migraines    "stopped many years ago" (06/14/2014)  .  Osteoporosis 08/19/2016  . Pericarditis   . Pneumonia "10 times" (06/14/2014)  . 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 Silent myocardial infarction ([place]) "late 1990's"  . Stress incontinence    "was suppose to have been tacked up years ago but I didn't do it"  . Syncope, near    Associated with atrial tachycardia-event recorder 1/16  . Thoracic outlet syndrome   . VSD (ventricular septal defect)     ROS:   All systems reviewed and negative except as noted in the HPI.   Past Surgical History:  Procedure Laterality Date  . ABDOMINAL AORTOGRAM W/LOWER EXTREMITY N/A 12/17/2016   Procedure: ABDOMINAL AORTOGRAM W/LOWER EXTREMITY;  Surgeon: Wellington Hampshire, MD;  Location: [facility];  Service:  Cardiovascular;  Laterality: N/A;  . BIV ICD INSERTION CRT-D N/A 03/17/2017   Procedure: BIV ICD INSERTION CRT-D;  Surgeon: Evans Lance, MD;  Location: [place];  Service: Cardiovascular;  Laterality: N/A;  . CARDIAC CATHETERIZATION  "quite a few"  . [place]  . CHOLECYSTECTOMY OPEN  1970's  . CORONARY ANGIOGRAM  2000   No significant CAD  . ELECTROPHYSIOLOGIC STUDY N/A 06/14/2014   Procedure: A-Flutter/A-Tach/SVT Ablation;  Surgeon: Evans Lance, MD;  Location: [place] [facility];  Service: Cardiovascular;  Laterality: N/A;  . INSERTION OF ICD  03/17/2017   BIV  . MYRINGOTOMY WITH TUBE PLACEMENT Right 2015  . PERIPHERAL VASCULAR INTERVENTION  12/17/2016   Procedure: PERIPHERAL VASCULAR INTERVENTION;  Surgeon: Wellington Hampshire, MD;  Location: [facility];  Service: Cardiovascular;;  Right common femoral PTA and Stent  . RIGHT/LEFT HEART CATH AND CORONARY ANGIOGRAPHY Bilateral 11/06/2016   Procedure: RIGHT/LEFT HEART CATH AND CORONARY ANGIOGRAPHY;  Surgeon: Wellington Hampshire, MD;  Location: [facility];  Service: Cardiovascular;  Laterality: Bilateral;  . SVT ABLATION  06/14/2014  . TUBAL LIGATION  1972  . VSD REPAIR  1958; 49     Family History  Problem Relation Age of Onset  . Cancer Mother   . Cancer Father   . Throat cancer Unknown   . Hypertension Unknown   . Stroke Unknown   . Alcohol abuse Other   . Arthritis Other   . Cancer Other        lung cancer  . Hypertension Other   . Arthritis Other   . Stroke Other   . Breast cancer Neg Hx      Social History   Socioeconomic History  . Marital status: Married    Spouse name: Not on file  . Number of children: Not on file  . Years of education: 24  . Highest education level: Not on file  Occupational History  . Occupation: retired  Scientific laboratory technician  . Financial resource strain: Not on file  . Food insecurity:    Worry: Not on file    Inability: Not on file  .  Transportation needs:    Medical: Not on file    Non-medical: Not on file  Tobacco Use  . Smoking status: Current Every Day Smoker    Packs/day: 0.33    Years: 35.00    Pack years: 11.55    Types: Cigarettes  . Smokeless tobacco: Never Used  . Tobacco comment: Smoked 1971-present , up to 1/2 ppd. Intermittent smoking cessation with preganancy up to 1/2-1 year @ a time  Substance and Sexual Activity  . Alcohol use: Yes    Alcohol/week: 1.0  standard drinks    Types: 1 Glasses of wine per week    Comment: 06/14/2014 "glass of wine maybe once/month"  . Drug use: No  . Sexual activity: Never  Lifestyle  . Physical activity:    Days per week: Not on file    Minutes per session: Not on file  . Stress: Not on file  Relationships  . Social connections:    Talks on phone: Not on file    Gets together: Not on file    Attends religious service: Not on file    Active member of club or organization: Not on file    Attends meetings of clubs or organizations: Not on file    Relationship status: Not on file  . Intimate partner violence:    Fear of current or ex partner: Not on file    Emotionally abused: Not on file    Physically abused: Not on file    Forced sexual activity: Not on file  Other Topics Concern  . Not on file  Social History Narrative   Pt lives with husband.     BP 124/64   Pulse 70   Ht 5\' 1"  (1.549 m)   Wt 104 lb (47.2 kg)   BMI 19.65 kg/m   Physical Exam:  Well appearing NAD HEENT: Unremarkable Neck:  No JVD, no thyromegally Lymphatics:  No adenopathy Back:  No CVA tenderness Lungs:  Clear with no wheezes HEART:  Regular rate rhythm, no murmurs, no rubs, no clicks Abd:  soft, positive bowel sounds, no organomegally, no rebound, no guarding Ext:  2 plus pulses, no edema, no cyanosis, no clubbing Skin:  No rashes no nodules Neuro:  CN II through XII intact, motor grossly intact   DEVICE  Normal device function.  See PaceArt for details.    Assess/Plan: 1. Chronic systolic heart failure - her symptoms are class 2. She will continue her current meds. She will follow with Dr. Aundra Dubin. 2. ICD - her [place] ICD is working normally. She will continue her current meds. 3. COPD - she is encouraged to stop smoking.  Mikle Bosworth.D.

## 2018-01-02 ENCOUNTER — Telehealth: Payer: Self-pay | Admitting: Cardiology

## 2018-01-02 NOTE — Telephone Encounter (Signed)
Received outpatient page from his [place] regarding a nosebleed.  Attempted to call back with callback number provided with no answer.   Kathyrn Drown NP-C [place] Pager: [phone]

## 2018-01-04 ENCOUNTER — Telehealth: Payer: Self-pay | Admitting: Cardiology

## 2018-01-04 NOTE — Telephone Encounter (Signed)
Remote transmission reviewed. Presenting rhythm: AsBp w/PACs, PVCs. No atrial or ventricular arrhythmias recorded. 38%Apaced, 97%RVpaced, 96%LVpaced. Stable histogram. Stable lead measurements. Normal device function.  Reprogramming changes would not cause nosebleeds.

## 2018-01-04 NOTE — Telephone Encounter (Signed)
Patient called and wanted to know if the changes made to her ICD at her 12-30-2017 appointment is causing her to have nose bleeds. She stated that she had 2 nose bleeds over the weekend. One Saturday and one on Sunday. She stated that on Saturday she got up and went to the bathroom and she felt something wet from her nose. She wiped it and it was bleeding. She stated that it was a moderate amount of blood. Then it happened Sunday night and she had a small colt come out. Instructed pt to send a manual transmission w/ her home monitor. Transmission received. Informed pt that I would send a message to the nurse and someone will call her back. Pt verbalized understanding. Please call her back at [phone].

## 2018-01-05 NOTE — Telephone Encounter (Signed)
Called pt to advise her that her transmission showed normal device function..she is concerned that she did  have the nose bleed on Sunday which took several minutes to get under control.. it was bright blood and not clotted but she did cough up from the  back of her throat a blood clot.. she has been on Entresto and ASA 81mg  for several months without difficulty.Marland Kitchen and this is the fist time she had this problem. Advised her that I will talk with our [facility] and let her know if they have any recommendations re: her meds... I advised her to try some normal saline nasal spray and to contact her PMD DR. John in case there is something else that should be assessed.. she reports she has been fine since then.

## 2018-01-05 NOTE — Telephone Encounter (Signed)
Spoke with patient- informed her that this unlikely medication related. Patient is on a baby ASA but denies any additional NSAID use such as IBU or naproxen. Advised patient to use saline spray to keep nose moist. Advised she could use Afrin nasal spray if bleeding was persistent, however she reports only bleeding for a few min.  Certainly think at this point benefit from ASA outweighs a couple minor nose bleeds

## 2018-01-08 ENCOUNTER — Telehealth: Payer: Self-pay | Admitting: *Deleted

## 2018-01-08 ENCOUNTER — Other Ambulatory Visit: Payer: Self-pay | Admitting: Cardiology

## 2018-01-08 LAB — CUP PACEART INCLINIC DEVICE CHECK
Date Time Interrogation Session: 20191220144854
Implantable Lead Implant Date: 20190226
Implantable Lead Location: 753858
Implantable Lead Location: 753859
Implantable Lead Model: 4671
Implantable Lead Serial Number: 726363
Implantable Lead Serial Number: 805938
Implantable Pulse Generator Implant Date: 20190226
MDC IDC LEAD IMPLANT DT: 20190226
MDC IDC LEAD IMPLANT DT: 20190226
MDC IDC LEAD LOCATION: 753860
MDC IDC LEAD SERIAL: 444536
Pulse Gen Serial Number: 204176

## 2018-01-08 NOTE — Telephone Encounter (Signed)
HeartLogic alert reviewed. Patient denies ShOB, LEE, abdominal distention, orthopnea. Patient states that she's only gained 0.5lbs since yesterday. I told patient to continue her same regimen, check weight daily, and if she develops sx's or gains 2lbs in a day or 5lbs in a week to call back. Patient verbalized understanding and appreciation of information.

## 2018-01-21 ENCOUNTER — Encounter (HOSPITAL_COMMUNITY): Payer: Medicare Other

## 2018-01-28 NOTE — Progress Notes (Signed)
PCP: Dr. Jenny Reichmann Cardiology: Dr. Harrington Challenger HF Cardiology: Dr. Aundra Dubin Pulmonary: Dr Melvyn Novas   73 y.o.with history of VSD repair as a child, COPD/active smoking, PAD, and chronic systolic CHF was referred by Dr. Harrington Challenger for evaluation of CHF.   Patient has a long history of cardiomyopathy.  She had VSD repaired as a child and imaging has not showed residual VSD.  Cardiac MRI in 11/09 showed EF 36%.  Echo in 12/15 showed EF 40-45%.  Echo in 3/18 showed EF down to 15-20% with moderate RV systolic dysfunction.  RHC/LHC in 10/18 showed nonobstructive coronary but was concerning for low output (CI 1.33).  She had a right lower leg slowly healing ulcer.  She had peripheral arteriogram with bilateral occluded CFAs. She had PCI to right CFA.    CPX in 1/19 showed moderate to severe functional limitation, combination of HF and lung disease, probably more related to the lung disease.  PFTs in 12/18 showed severe COPD.   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    7/19 peripheral arterial dopplers showed significant in-stent restenosis right SFA stent.  She returns today for HF follow up. She was started on weekly lasix last visit. Heart Logic score was 20. Overall doing well. Weights are 103-104 lbs at home. Taking lasix once/week. Taking an extra PRN, took last week after eating high salt foods. She is now limiting salt intake and is checking labels. She has rare dizziness in the mornings 1 hour after taking her medications, but nothing like when she was dehydrated. She gets a band of chest tightness when she gets upset. Relieved by rest and massage. Not reproducible with exertion. Has mild LE edema with extra volume. No orthopnea or PND. Chronic dry cough with COPD. Denies SOB. Not very active other than walking and taking care of her dogs. Taking all medications. Still smoking at least 6 cig/day.   [name] device interrogation: Heart Logic Score 3. no AF/AT. No VT/VF. Active 1.5 hours/day. 96% BiV  pacing. Personally reviewed.   Labs (7/18): LDL 61 Labs (11/18): K 4.2, creatinine 0.7, hgb 13.7 Labs (1/19): K 4.3, creatinine 1.07 Labs (2/19): K 4.5, creatinine 1.14 Labs (7/19): K 4.4, creatinine 1.04, LDL 61, digoxin 1.2 Labs (9/19): digoxin 0.7, K 4.8, creatinine 0.93  PMH: 1. VSD: s/p repair at [facility] as child.  From description, sounds like muscular VSD.  2. Atrial tachycardia s/p ablation.  3. Hyperlipidemia 4. COPD: Active smoker.  - PFTs (12/18): Severe obstructive lung disease.  5. Atrial fibrillation: Paroxysmal.   Noted only transiently.   6. PAD: ABIs 11/18 with 0.62 on right, 0.72 on left.  - Angiogram 11/18 showed totally occluded bilateral CFAs.  Patient had stent to right CFA.  ABIs in 12/18 were normal on right. - Peripheral arterial dopplers (7/19) with significant in-stent restenosis right CFA.   7. Chronic systolic CHF: Nonischemic cardiomyopathy.   - cMRI (11/09): EF 36%, VSD patch in anterior ventricular septum - Echo (12/15): EF 40-45% - Echo (3/18): EF 15-20%, moderate LV dilation, moderate MR, moderate RV dilation with moderately decreased systolic function.  - LHC/RHC (10/18): 3+ MR, EF < 25%, minimal nonobstructive CAD; PA 41/21, LVEDP 18, CI 1.33.  - CPX (1/19): peak VO2 13.4, VE/VCO2 slope 43, RER 1.07.  Mod-severe functional limitation due to combination of HF and lung disease, probably more related to lung disease.  - [name] CRT-D device.  - Echo (7/19): EF 25-30%, mild LV dilation, mild MR, normal RV size and systolic  function.  8. PVCs: Zio patch 9/19 with 6% PVCs.   SH: Active smoker, lives in [place], married.  FH: Mother with PAD, sister died at birth from congenital heart disease.   Review of systems complete and found to be negative unless listed in HPI.   Current Outpatient Medications  Medication Sig Dispense Refill  . albuterol (VENTOLIN HFA) 108 (90 Base) MCG/ACT inhaler Inhale 2 puffs into the lungs every 6 (six) hours as  needed for wheezing or shortness of breath. 18 g 11  . ALPRAZolam (XANAX) 0.25 MG tablet TAKE 1 TABLET BY MOUTH TWICE A DAY AS NEEDED FOR ANXIETY (Patient taking differently: Take 0.25 mg by mouth at bedtime as needed for sleep. ) 60 tablet 2  . aspirin 81 MG tablet Take 1 tablet (81 mg total) by mouth daily. 100 tablet 99  . carvedilol (COREG) 3.125 MG tablet Take 2 tablets (6.25 mg total) by mouth 2 (two) times daily. 360 tablet 3  . celecoxib (CELEBREX) 100 MG capsule Take 100 mg by mouth 2 (two) times daily as needed for moderate pain.    . chlorpheniramine-HYDROcodone (TUSSIONEX) 10-8 MG/5ML SUER TAKE 5 ML BY MOUTH EVERY 12 HOURS AS NEEDED FOR COUGH 115 mL 0  . colchicine 0.6 MG tablet Take 1 tablet TID until GI upset or flare subsides, then for maintenance, take 1 tablet daily. 90 tablet 3  . digoxin 62.5 MCG TABS Take 0.0625 mg by mouth daily. 90 tablet 3  . ENTRESTO 24-26 MG TAKE 1 TABLET BY MOUTH TWICE A DAY 60 tablet 3  . ibuprofen (ADVIL,MOTRIN) 200 MG tablet Take 200 mg by mouth daily as needed for headache.    . rosuvastatin (CRESTOR) 10 MG tablet TAKE 1 TABLET BY MOUTH EVERY DAY 90 tablet 1  . spironolactone (ALDACTONE) 25 MG tablet Take 1 tablet (25 mg total) by mouth daily. 30 tablet 3  . varenicline (CHANTIX CONTINUING MONTH PAK) 1 MG tablet Take 1 tablet (1 mg total) by mouth 2 (two) times daily. 60 tablet 6  . varenicline (CHANTIX STARTING MONTH PAK) 0.5 MG X 11 & 1 MG X 42 tablet Take one 0.5 mg tablet by mouth once daily for 3 days, then increase to one 0.5 mg tablet twice daily for 4 days, then increase to one 1 mg tablet twice daily. 53 tablet 0  . furosemide (LASIX) 20 MG tablet Take 1 tablet (20 mg total) by mouth daily for 2 days, THEN 1 tablet (20 mg total) once a week. 30 tablet 11   No current facility-administered medications for this encounter.    BP 98/60   Pulse 64   Wt 48.4 kg (106 lb 9.6 oz)   SpO2 96%   BMI 20.14 kg/m   . Wt Readings from Last 3  Encounters:  01/29/18 48.4 kg (106 lb 9.6 oz)  12/30/17 47.2 kg (104 lb)  12/23/17 48.7 kg (107 lb 6.4 oz)   General: No resp difficulty. HEENT: Normal Neck: Supple. JVP 5-6. Carotids 2+ bilat; no bruits. No thyromegaly or nodule noted. Cor: PMI nondisplaced. RRR, No M/G/R noted Lungs: distant, clear Abdomen: Soft, non-tender, non-distended, no HSM. No bruits or masses. +BS  Extremities: No cyanosis, clubbing, or rash. R and LLE no edema.  Neuro: Alert & orientedx3, cranial nerves grossly intact. moves all 4 extremities w/o difficulty. Affect pleasant  Assessment/Plan: 1.  PAD: She had PCI to occluded right CFA in 11/18, but 7/19 peripheral arterial dopplers showed significant in-stent restenosis in the right CFA. She  has occluded left CFA also that was not intervened on. Mild claudication, no pedal ulcers.  - Discussed smoking cessation. No change.  - Continue statin.  - Follows with Dr. Fletcher Anon.  2. Smoking:Discussed smoking cessation. Continues to smoke. Could not tolerate chantix.  3. CAD: Nonobstructive on 10/18 cath. No s/s ischemia.  - She is on ASA 81 and statin.  4. Hyperlipidemia: Good lipids in 7/19.  Continue Crestor.  No change.  5. COPD: Severe by 12/18 PFTs. COPD plays a significant role in her dyspnea. Previously walked in hallway and O2 did not drop.  6. Chronic systolic CHF: Nonischemic cardiomyopathy.  RHC/LHC in 10/18 showed no nonobstructive coronary disease but CI was very low at 1.33.  Her low cardiac output was out of proportion to her symptoms.  [name] CRT-D device.  CPX suggested moderate-severe functional limitation, but probably more due to COPD than CHF.  Most recent echo in 7/19 with EF 25-30%.   Heart Logic score 3. NYHA III. BiV pacing 96%.  - Volume status stable. Continue lasix 20 mg every Monday. She can take an extra 20 mg PRN. BMET today.  - Continue Entresto 24/26 bid.  - Continue Coreg 3.125 mg bid.  - Continue spironolactone 25 mg daily.  She will try taking at night to decrease dizziness after am meds.  - Continue digoxin, dig level 0.3 12/08/2017   -  LVAD would be a difficult proposition for her given severity of lung disease.   7. Mitral regurgitation: Only mild on most recent echo though her murmur is prominent.   BMET Keep f/u with Dr Aundra Dubin 3/6.   [name] Shore NP-C  01/29/2018  Greater than 50% of the 25 minute visit was spent in counseling/coordination of care regarding disease state education, salt/fluid restriction, sliding scale diuretics, and medication compliance.

## 2018-01-29 ENCOUNTER — Ambulatory Visit (HOSPITAL_COMMUNITY)
Admission: RE | Admit: 2018-01-29 | Discharge: 2018-01-29 | Disposition: A | Discharge status: Home / Self Care | Payer: Medicare Other | Source: Ambulatory Visit | Attending: Cardiology | Admitting: Cardiology

## 2018-01-29 ENCOUNTER — Encounter (HOSPITAL_COMMUNITY): Payer: Self-pay

## 2018-01-29 VITALS — BP 98/60 | HR 64 | Wt 106.6 lb

## 2018-01-29 DIAGNOSIS — I70291 Other atherosclerosis of native arteries of extremities, right leg: Secondary | ICD-10-CM | POA: Diagnosis not present

## 2018-01-29 DIAGNOSIS — I739 Peripheral vascular disease, unspecified: Secondary | ICD-10-CM | POA: Diagnosis not present

## 2018-01-29 DIAGNOSIS — Q210 Ventricular septal defect: Secondary | ICD-10-CM | POA: Insufficient documentation

## 2018-01-29 DIAGNOSIS — J449 Chronic obstructive pulmonary disease, unspecified: Secondary | ICD-10-CM

## 2018-01-29 DIAGNOSIS — Z7982 Long term (current) use of aspirin: Secondary | ICD-10-CM | POA: Insufficient documentation

## 2018-01-29 DIAGNOSIS — I5022 Chronic systolic (congestive) heart failure: Secondary | ICD-10-CM

## 2018-01-29 DIAGNOSIS — Y838 Other surgical procedures as the cause of abnormal reaction of the patient, or of later complication, without mention of misadventure at the time of the procedure: Secondary | ICD-10-CM | POA: Diagnosis not present

## 2018-01-29 DIAGNOSIS — I2510 Atherosclerotic heart disease of native coronary artery without angina pectoris: Secondary | ICD-10-CM | POA: Diagnosis not present

## 2018-01-29 DIAGNOSIS — Z79899 Other long term (current) drug therapy: Secondary | ICD-10-CM | POA: Insufficient documentation

## 2018-01-29 DIAGNOSIS — R011 Cardiac murmur, unspecified: Secondary | ICD-10-CM | POA: Insufficient documentation

## 2018-01-29 DIAGNOSIS — F17200 Nicotine dependence, unspecified, uncomplicated: Secondary | ICD-10-CM | POA: Insufficient documentation

## 2018-01-29 DIAGNOSIS — T82856A Stenosis of peripheral vascular stent, initial encounter: Secondary | ICD-10-CM | POA: Insufficient documentation

## 2018-01-29 DIAGNOSIS — E785 Hyperlipidemia, unspecified: Secondary | ICD-10-CM | POA: Insufficient documentation

## 2018-01-29 DIAGNOSIS — I480 Paroxysmal atrial fibrillation: Secondary | ICD-10-CM | POA: Diagnosis not present

## 2018-01-29 DIAGNOSIS — I70212 Atherosclerosis of native arteries of extremities with intermittent claudication, left leg: Secondary | ICD-10-CM | POA: Insufficient documentation

## 2018-01-29 LAB — BASIC METABOLIC PANEL
Anion gap: 5 (ref 5–15)
BUN: 18 mg/dL (ref 8–23)
CO2: 29 mmol/L (ref 22–32)
Calcium: 9.2 mg/dL (ref 8.9–10.3)
Chloride: 102 mmol/L (ref 98–111)
Creatinine, Ser: 1.04 mg/dL — ABNORMAL HIGH (ref 0.44–1.00)
GFR, EST NON AFRICAN AMERICAN: 54 mL/min — AB (ref >60)
Glucose, Bld: 91 mg/dL (ref 70–99)
POTASSIUM: 4.3 mmol/L (ref 3.5–5.1)
SODIUM: 136 mmol/L (ref 135–145)

## 2018-01-29 MED ORDER — SPIRONOLACTONE 25 MG PO TABS: 25.0000 mg | ORAL_TABLET | Freq: Every day | ORAL | 3 refills | Status: DC

## 2018-01-29 NOTE — Patient Instructions (Signed)
CHANGE Spironolactone to 25 mg, one tab daily at BEDTIME  Labs today We will only contact you if something comes back abnormal or we need to make some changes. Otherwise no news is good news!  KEEP YOUR FOLLOW UP AS SCHEDULED WITH DR [facility] IN 8 WEEKS  Do the following things EVERYDAY: 1) Weigh yourself in the morning before breakfast. Write it down and keep it in a log. 2) Take your medicines as prescribed 3) Eat low salt foods-Limit salt (sodium) to 2000 mg per day.  4) Stay as active as you can everyday 5) Limit all fluids for the day to less than 2 liters

## 2018-02-05 LAB — CUP PACEART REMOTE DEVICE CHECK
Date Time Interrogation Session: 20200117154932
Implantable Lead Implant Date: 20190226
Implantable Lead Implant Date: 20190226
Implantable Lead Location: 753858
Implantable Lead Location: 753859
Implantable Lead Model: 292
Implantable Lead Model: 4671
Implantable Lead Model: 7740
Implantable Lead Serial Number: 444536
Implantable Lead Serial Number: 726363
Implantable Pulse Generator Implant Date: 20190226
MDC IDC LEAD IMPLANT DT: 20190226
MDC IDC LEAD LOCATION: 753860
MDC IDC LEAD SERIAL: 805938
MDC IDC PG SERIAL: 204176

## 2018-02-05 NOTE — Telephone Encounter (Signed)
This encounter was created in error - please disregard.

## 2018-02-08 ENCOUNTER — Ambulatory Visit: Payer: Medicare Other | Admitting: Family

## 2018-02-10 ENCOUNTER — Ambulatory Visit: Payer: Medicare Other | Admitting: Internal Medicine

## 2018-02-11 ENCOUNTER — Ambulatory Visit: Payer: Medicare Other | Admitting: Internal Medicine

## 2018-02-17 ENCOUNTER — Ambulatory Visit: Payer: Medicare Other | Admitting: Internal Medicine

## 2018-03-02 ENCOUNTER — Encounter (HOSPITAL_COMMUNITY): Payer: Medicare Other | Admitting: Cardiology

## 2018-03-17 ENCOUNTER — Ambulatory Visit (INDEPENDENT_AMBULATORY_CARE_PROVIDER_SITE_OTHER): Payer: Medicare Other | Admitting: *Deleted

## 2018-03-17 DIAGNOSIS — I420 Dilated cardiomyopathy: Secondary | ICD-10-CM

## 2018-03-20 LAB — CUP PACEART REMOTE DEVICE CHECK
Date Time Interrogation Session: 20200229104657
Implantable Lead Implant Date: 20190226
Implantable Lead Location: 753858
Implantable Lead Location: 753859
Implantable Lead Location: 753860
Implantable Lead Model: 292
Implantable Lead Model: 7740
Implantable Lead Serial Number: 444536
MDC IDC LEAD IMPLANT DT: 20190226
MDC IDC LEAD IMPLANT DT: 20190226
MDC IDC LEAD SERIAL: 726363
MDC IDC LEAD SERIAL: 805938
MDC IDC PG IMPLANT DT: 20190226
MDC IDC PG SERIAL: 204176

## 2018-03-24 ENCOUNTER — Encounter: Payer: Self-pay | Admitting: Cardiology

## 2018-03-24 NOTE — Progress Notes (Signed)
Remote ICD transmission.   

## 2018-03-26 ENCOUNTER — Ambulatory Visit (HOSPITAL_COMMUNITY)
Admission: RE | Admit: 2018-03-26 | Discharge: 2018-03-26 | Disposition: A | Discharge status: Home / Self Care | Payer: Medicare Other | Source: Ambulatory Visit | Attending: Cardiology | Admitting: Cardiology

## 2018-03-26 VITALS — BP 100/62 | HR 65 | Wt 109.0 lb

## 2018-03-26 DIAGNOSIS — R011 Cardiac murmur, unspecified: Secondary | ICD-10-CM | POA: Diagnosis not present

## 2018-03-26 DIAGNOSIS — I2510 Atherosclerotic heart disease of native coronary artery without angina pectoris: Secondary | ICD-10-CM | POA: Insufficient documentation

## 2018-03-26 DIAGNOSIS — I5022 Chronic systolic (congestive) heart failure: Secondary | ICD-10-CM | POA: Diagnosis not present

## 2018-03-26 DIAGNOSIS — F17200 Nicotine dependence, unspecified, uncomplicated: Secondary | ICD-10-CM | POA: Diagnosis not present

## 2018-03-26 DIAGNOSIS — J449 Chronic obstructive pulmonary disease, unspecified: Secondary | ICD-10-CM | POA: Diagnosis not present

## 2018-03-26 DIAGNOSIS — Z79899 Other long term (current) drug therapy: Secondary | ICD-10-CM | POA: Insufficient documentation

## 2018-03-26 DIAGNOSIS — I739 Peripheral vascular disease, unspecified: Secondary | ICD-10-CM | POA: Insufficient documentation

## 2018-03-26 DIAGNOSIS — I480 Paroxysmal atrial fibrillation: Secondary | ICD-10-CM | POA: Diagnosis not present

## 2018-03-26 DIAGNOSIS — E785 Hyperlipidemia, unspecified: Secondary | ICD-10-CM | POA: Insufficient documentation

## 2018-03-26 DIAGNOSIS — F17210 Nicotine dependence, cigarettes, uncomplicated: Secondary | ICD-10-CM | POA: Diagnosis not present

## 2018-03-26 DIAGNOSIS — Z7982 Long term (current) use of aspirin: Secondary | ICD-10-CM | POA: Insufficient documentation

## 2018-03-26 LAB — DIGOXIN LEVEL: Digoxin Level: 0.6 ng/mL — ABNORMAL LOW (ref 0.8–2.0)

## 2018-03-26 MED ORDER — TIOTROPIUM BROMIDE MONOHYDRATE 18 MCG IN CAPS: 18.0000 ug | ORAL_CAPSULE | Freq: Every day | RESPIRATORY_TRACT | 12 refills | Status: DC

## 2018-03-26 NOTE — Patient Instructions (Signed)
Labs were completed today, we will call you if any results are ABNORMAL. No news is good news.  BEGIN Spiriva 1 puff each day, this has been sent to your pharmacy.   Your physician recommends that you schedule a follow-up appointment in: 3 MONTHS

## 2018-03-28 NOTE — Progress Notes (Signed)
PCP: Dr. Jenny Reichmann Cardiology: Dr. Harrington Challenger HF Cardiology: Dr. Aundra Dubin  73 y.o.with history of VSD repair as a child, COPD/active smoking, PAD, and chronic systolic CHF was referred by Dr. Harrington Challenger for evaluation of CHF.   Patient has a long history of cardiomyopathy.  She had VSD repaired as a child and imaging has not showed residual VSD.  Cardiac MRI in 11/09 showed EF 36%.  Echo in 12/15 showed EF 40-45%.  Echo in 3/18 showed EF down to 15-20% with moderate RV systolic dysfunction.  RHC/LHC in 10/18 showed nonobstructive coronary but was concerning for low output (CI 1.33).  She had a right lower leg slowly healing ulcer.  She had peripheral arteriogram with bilateral occluded CFAs. She had PCI to right CFA.    CPX in 1/19 showed moderate to severe functional limitation, combination of HF and lung disease, probably more related to the lung disease.  PFTs in 12/18 showed severe COPD.   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    7/19 peripheral arterial dopplers showed significant in-stent restenosis right SFA stent.  She returns for followup of CHF.  She is still smoking about a pack every 3 days.  Weight is stable at home.  She has "good days and bad days."  Generally no dyspnea walking on flat ground but not very active. Short of breath with hills.  Minimal claudication pain, notes only with a long walk.  Rare atypical chest pain (occurs with anxiety/stress).  No orthopnea/PND.  No lightheadedness.    [name] device interrogation: Heartlogic score 8.  No AT/AF.  No VT.    Labs (7/18): LDL 61 Labs (11/18): K 4.2, creatinine 0.7, hgb 13.7 Labs (1/19): K 4.3, creatinine 1.07 Labs (2/19): K 4.5, creatinine 1.14 Labs (7/19): K 4.4, creatinine 1.04, LDL 61, digoxin 1.2 Labs (9/19): digoxin 0.7, K 4.8, creatinine 0.93 Labs (1/20): K 4.3, creatinine 1.04  PMH: 1. VSD: s/p repair at [facility] as child.  From description, sounds like muscular VSD.  2. Atrial tachycardia s/p ablation.  3.  Hyperlipidemia 4. COPD: Active smoker.  - PFTs (12/18): Severe obstructive lung disease.  5. Atrial fibrillation: Paroxysmal.   Noted only transiently.   6. PAD: ABIs 11/18 with 0.62 on right, 0.72 on left.  - Angiogram 11/18 showed totally occluded bilateral CFAs.  Patient had stent to right CFA.  ABIs in 12/18 were normal on right. - Peripheral arterial dopplers (7/19) with significant in-stent restenosis right CFA.   7. Chronic systolic CHF: Nonischemic cardiomyopathy.   - cMRI (11/09): EF 36%, VSD patch in anterior ventricular septum - Echo (12/15): EF 40-45% - Echo (3/18): EF 15-20%, moderate LV dilation, moderate MR, moderate RV dilation with moderately decreased systolic function.  - LHC/RHC (10/18): 3+ MR, EF < 25%, minimal nonobstructive CAD; PA 41/21, LVEDP 18, CI 1.33.  - CPX (1/19): peak VO2 13.4, VE/VCO2 slope 43, RER 1.07.  Mod-severe functional limitation due to combination of HF and lung disease, probably more related to lung disease.  - [name] CRT-D device.  - Echo (7/19): EF 25-30%, mild LV dilation, mild MR, normal RV size and systolic function.  8. PVCs: Zio patch 9/19 with 6% PVCs.   SH: Active smoker, lives in [place], married.  FH: Mother with PAD, sister died at birth from congenital heart disease.   ROS: All systems reviewed and negative except as per HPI.   Current Outpatient Medications  Medication Sig Dispense Refill  . albuterol (VENTOLIN HFA) 108 (90 Base) MCG/ACT inhaler  Inhale 2 puffs into the lungs every 6 (six) hours as needed for wheezing or shortness of breath. 18 g 11  . ALPRAZolam (XANAX) 0.25 MG tablet TAKE 1 TABLET BY MOUTH TWICE A DAY AS NEEDED FOR ANXIETY (Patient taking differently: Take 0.25 mg by mouth at bedtime as needed for sleep. ) 60 tablet 2  . aspirin 81 MG tablet Take 1 tablet (81 mg total) by mouth daily. 100 tablet 99  . carvedilol (COREG) 3.125 MG tablet Take 2 tablets (6.25 mg total) by mouth 2 (two) times daily. 360  tablet 3  . celecoxib (CELEBREX) 100 MG capsule Take 100 mg by mouth 2 (two) times daily as needed for moderate pain.    . chlorpheniramine-HYDROcodone (TUSSIONEX) 10-8 MG/5ML SUER TAKE 5 ML BY MOUTH EVERY 12 HOURS AS NEEDED FOR COUGH 115 mL 0  . colchicine 0.6 MG tablet Take 1 tablet TID until GI upset or flare subsides, then for maintenance, take 1 tablet daily. 90 tablet 3  . digoxin 62.5 MCG TABS Take 0.0625 mg by mouth daily. 90 tablet 3  . ENTRESTO 24-26 MG TAKE 1 TABLET BY MOUTH TWICE A DAY 60 tablet 3  . ibuprofen (ADVIL,MOTRIN) 200 MG tablet Take 200 mg by mouth daily as needed for headache.    . rosuvastatin (CRESTOR) 10 MG tablet TAKE 1 TABLET BY MOUTH EVERY DAY 90 tablet 1  . spironolactone (ALDACTONE) 25 MG tablet Take 1 tablet (25 mg total) by mouth at bedtime. 30 tablet 3  . varenicline (CHANTIX CONTINUING MONTH PAK) 1 MG tablet Take 1 tablet (1 mg total) by mouth 2 (two) times daily. 60 tablet 6  . varenicline (CHANTIX STARTING MONTH PAK) 0.5 MG X 11 & 1 MG X 42 tablet Take one 0.5 mg tablet by mouth once daily for 3 days, then increase to one 0.5 mg tablet twice daily for 4 days, then increase to one 1 mg tablet twice daily. 53 tablet 0  . furosemide (LASIX) 20 MG tablet Take 1 tablet (20 mg total) by mouth daily for 2 days, THEN 1 tablet (20 mg total) once a week. 30 tablet 11  . tiotropium (SPIRIVA HANDIHALER) 18 MCG inhalation capsule Place 1 capsule (18 mcg total) into inhaler and inhale daily. 30 capsule 12   No current facility-administered medications for this encounter.    BP 100/62   Pulse 65   Wt 49.4 kg (109 lb)   SpO2 96%   BMI 20.60 kg/m  General: NAD Neck: No JVD, no thyromegaly or thyroid nodule.  Lungs: Occasional wheezes. CV: Nondisplaced PMI.  Heart regular S1/S2, no S3/S4, 2/6 HSM LLSB/apex.  No peripheral edema.  No carotid bruit.  Unable to palpate pedal pulses.  Abdomen: Soft, nontender, no hepatosplenomegaly, no distention.  Skin: Intact without  lesions or rashes.  Neurologic: Alert and oriented x 3.  Psych: Normal affect. Extremities: No clubbing or cyanosis.  HEENT: Normal.   Assessment/Plan: 1.  PAD: She had PCI to occluded right CFA in 11/18, but 7/19 peripheral arterial dopplers showed significant in-stent restenosis in the right CFA. She has occluded left CFA also that was not intervened on. Mild claudication, no pedal ulcers.  - Needs to stop smoking.  - Continue statin.  - Follows with Dr. Fletcher Anon.  2. Smoking: I strongly encouraged her to quit.  She is working on it.  Did not tolerate Chantix well.  3. CAD: Nonobstructive on 10/18 cath. Rare atypical chest pain.  - She is on ASA 81 and statin.  4. Hyperlipidemia: Good lipids in 7/19.  Continue Crestor.   5. COPD: Severe by 12/18 PFTs. COPD plays a significant role in her dyspnea.  - I will give her a prescription for Spiriva. She needs to follow up with pulmonary.  6. Chronic systolic CHF: Nonischemic cardiomyopathy.  RHC/LHC in 10/18 showed no nonobstructive coronary disease but CI was very low at 1.33.  Her low cardiac output was out of proportion to her symptoms.  [name] CRT-D device.  CPX suggested moderate-severe functional limitation, but probably more due to COPD than CHF.  Most recent echo in 7/19 with EF 25-30%.  On exam, she is not volume overloaded and Heartlogic score is 8. NYHA class III symptoms, likely due mostly to COPD.  She has not tolerated further medication titration due to orthostatic symptoms.  - Continue Entresto 24/26 bid.  - Continue Lasix 20 mg once weekly.  BMET today.  - Continue Coreg 6.25 mg bid.  - Continue spironolactone 25 mg daily, take in evening as this helps to limit orthostatic symptoms.  - Continue digoxin, check level today.  - LVAD would be a difficult proposition for her given severity of lung disease.   7. Mitral regurgitation: Only mild on most recent echo though her murmur is prominent.   Followup in 3 months.    Loralie Champagne 03/28/2018

## 2018-04-01 ENCOUNTER — Other Ambulatory Visit: Payer: Self-pay | Admitting: Internal Medicine

## 2018-04-01 NOTE — Telephone Encounter (Signed)
Done erx 

## 2018-04-05 ENCOUNTER — Ambulatory Visit: Payer: Self-pay | Admitting: Internal Medicine

## 2018-04-05 NOTE — Telephone Encounter (Signed)
Routing to dr john, please advise, I will call patient back, thanks 

## 2018-04-05 NOTE — Telephone Encounter (Signed)
Unfortunately needs OV despite her hesitation.  She can likely take immodium otc prn prior to coming if she is concerned about an accident while here

## 2018-04-05 NOTE — Telephone Encounter (Signed)
I returned pt's call.   She has been having diarrhea since Friday.   "I've gone 20 times with diarrhea since Friday".   "This morning I've had 4 episodes of diarrhea".   She is drinking Pedialyte and a lot of water.  "I'm a heart pt and ended up in the ER due to dehydration so I'm drinking plenty of fluids and Pedialyte.    She is also eating ok.  She is requesting not to come into the office because of the diarrhea.   "I don't think I can come in due to the diarrhea".     See triage notes.  I let the pt know someone would be in touch with her.   She was agreeable to this plan.  I have sent these triage notes to Dr. Gwynn Burly nurse pool high priority for further disposition.  Reason for Disposition . [1] SEVERE diarrhea (e.g., 7 or more times / day more than normal) AND [2] age > 60 years  Answer Assessment - Initial Assessment Questions 1. DIARRHEA SEVERITY: "How bad is the diarrhea?" "How many extra stools have you had in the past 24 hours than normal?"    - NO DIARRHEA (SCALE 0)   - MILD (SCALE 1-3): Few loose or mushy BMs; increase of 1-3 stools over normal daily number of stools; mild increase in ostomy output.   -  MODERATE (SCALE 4-7): Increase of 4-6 stools daily over normal; moderate increase in ostomy output. * SEVERE (SCALE 8-10; OR 'WORST POSSIBLE'): Increase of 7 or more stools daily over normal; moderate increase in ostomy output; incontinence.     I've been 4 times this morning.   Since Friday been with diarrhea 20 times.   I'm drinking Pedialite, water.    I've used Pepto Bismal did not help.   Yesterday evening I felt a little better. 2. ONSET: "When did the diarrhea begin?"      Friday.    3. BM CONSISTENCY: "How loose or watery is the diarrhea?"      This morning it soft and now it's watery.    4. VOMITING: "Are you also vomiting?" If so, ask: "How many times in the past 24 hours?"      No vomiting    All diarrhea.     5. ABDOMINAL PAIN: "Are you having any abdominal pain?"  If yes: "What does it feel like?" (e.g., crampy, dull, intermittent, constant)      No cramping. 6. ABDOMINAL PAIN SEVERITY: If present, ask: "How bad is the pain?"  (e.g., Scale 1-10; mild, moderate, or severe)   - MILD (1-3): doesn't interfere with normal activities, abdomen soft and not tender to touch    - MODERATE (4-7): interferes with normal activities or awakens from sleep, tender to touch    - SEVERE (8-10): excruciating pain, doubled over, unable to do any normal activities       No pain 7. ORAL INTAKE: If vomiting, "Have you been able to drink liquids?" "How much fluids have you had in the past 24 hours?"     Yesterday day I bacon and toast.   I'm keeping my food down.    8. HYDRATION: "Any signs of dehydration?" (e.g., dry mouth [not just dry lips], too weak to stand, dizziness, new weight loss) "When did you last urinate?"     Denies dizziness.   I'm drinking plenty of fluids.   9. EXPOSURE: "Have you traveled to a foreign country recently?" "Have you been exposed to anyone  with diarrhea?" "Could you have eaten any food that was spoiled?"     No.    10. ANTIBIOTIC USE: "Are you taking antibiotics now or have you taken antibiotics in the past 2 months?"       No 11. OTHER SYMPTOMS: "Do you have any other symptoms?" (e.g., fever, blood in stool)       No.   No blood 12. PREGNANCY: "Is there any chance you are pregnant?" "When was your last menstrual period?"       N/A due to age  Protocols used: DIARRHEA-A-AH

## 2018-04-06 NOTE — Telephone Encounter (Signed)
Patient advised of dr Gwynn Burly note/instructions---she was given option of appts today with dr Jenny Reichmann, I do have openings right now, however, patient states she has almost stopped with diarrhea, has really lessened, she has stayed well hydrated, esp with usage of pedialyte--she will call back if she thinks she needs to be seen at later date or if symptoms worsen

## 2018-04-12 ENCOUNTER — Other Ambulatory Visit (HOSPITAL_COMMUNITY): Payer: Self-pay | Admitting: Cardiology

## 2018-04-14 ENCOUNTER — Other Ambulatory Visit: Payer: Self-pay

## 2018-05-11 ENCOUNTER — Telehealth: Payer: Self-pay | Admitting: Cardiovascular Disease

## 2018-05-11 NOTE — Telephone Encounter (Signed)
Virtual Visit Pre-Appointment Phone Call  "(Name), I am calling you today to discuss your upcoming appointment. We are currently trying to limit exposure to the virus that causes COVID-19 by seeing patients at home rather than in the office."  1. "What is the BEST phone number to call the day of the visit?" - include this in appointment notes  2. Do you have or have access to (through a family member/friend) a smartphone with video capability that we can use for your visit?" a. If yes - list this number in appt notes as cell (if different from BEST phone #) and list the appointment type as a VIDEO visit in appointment notes b. If no - list the appointment type as a PHONE visit in appointment notes  3. Confirm consent - "In the setting of the current Covid19 crisis, you are scheduled for a (phone or video) visit with your provider on (date) at (time).  Just as we do with many in-office visits, in order for you to participate in this visit, we must obtain consent.  If you'd like, I can send this to your mychart (if signed up) or email for you to review.  Otherwise, I can obtain your verbal consent now.  All virtual visits are billed to your insurance company just like a normal visit would be.  By agreeing to a virtual visit, we'd like you to understand that the technology does not allow for your provider to perform an examination, and thus may limit your provider's ability to fully assess your condition. If your provider identifies any concerns that need to be evaluated in person, we will make arrangements to do so.  Finally, though the technology is pretty good, we cannot assure that it will always work on either your or our end, and in the setting of a video visit, we may have to convert it to a phone-only visit.  In either situation, we cannot ensure that we have a secure connection.  Are you willing to proceed?" STAFF: Did the patient verbally acknowledge consent to telehealth visit? Document  YES/NO here: Yes   4. Advise patient to be prepared - "Two hours prior to your appointment, go ahead and check your blood pressure, pulse, oxygen saturation, and your weight (if you have the equipment to check those) and write them all down. When your visit starts, your provider will ask you for this information. If you have an Apple Watch or Kardia device, please plan to have heart rate information ready on the day of your appointment. Please have a pen and paper handy nearby the day of the visit as well."  5. Give patient instructions for MyChart download to smartphone OR Doximity/Doxy.me as below if video visit (depending on what platform provider is using)  6. Inform patient they will receive a phone call 15 minutes prior to their appointment time (may be from unknown caller ID) so they should be prepared to answer    TELEPHONE CALL NOTE  ANDEE [name] has been deemed a candidate for a follow-up tele-health visit to limit community exposure during the Covid-19 pandemic. I spoke with the patient via phone to ensure availability of phone/video source, confirm preferred email & phone number, and discuss instructions and expectations.  I reminded Kimberly [name] to be prepared with any vital sign and/or heart rhythm information that could potentially be obtained via home monitoring, at the time of her visit. I reminded Kimberly [name] to expect a phone call prior  to her visit.  Kimberly [name] 05/11/2018 3:04 PM   INSTRUCTIONS FOR DOWNLOADING THE MYCHART APP TO SMARTPHONE  - The patient must first make sure to have activated MyChart and know their login information - If Apple, go to [employer] and type in MyChart in the search bar and download the app. If Android, ask patient to go to [employer] and type in [place] in the search bar and download the app. The app is free but as with any other app downloads, their phone may require them to verify saved payment information or Apple/Android  password.  - The patient will need to then log into the app with their MyChart username and password, and select  as their healthcare provider to link the account. When it is time for your visit, go to the MyChart app, find appointments, and click Begin Video Visit. Be sure to Select Allow for your device to access the Microphone and Camera for your visit. You will then be connected, and your provider will be with you shortly.  **If they have any issues connecting, or need assistance please contact MyChart service desk [phone])**  **If using a computer, in order to ensure the best quality for their visit they will need to use either of the following Internet Browsers: [employer], or Google Chrome**  IF USING DOXIMITY or DOXY.ME - The patient will receive a link just prior to their visit by text.     FULL LENGTH CONSENT FOR TELE-HEALTH VISIT   I hereby voluntarily request, consent and authorize [facility] and its employed or contracted physicians, physician assistants, nurse practitioners or other licensed health care professionals (the Practitioner), to provide me with telemedicine health care services (the Services") as deemed necessary by the treating Practitioner. I acknowledge and consent to receive the Services by the Practitioner via telemedicine. I understand that the telemedicine visit will involve communicating with the Practitioner through live audiovisual communication technology and the disclosure of certain medical information by electronic transmission. I acknowledge that I have been given the opportunity to request an in-person assessment or other available alternative prior to the telemedicine visit and am voluntarily participating in the telemedicine visit.  I understand that I have the right to withhold or withdraw my consent to the use of telemedicine in the course of my care at any time, without affecting my right to future care or treatment,  and that the Practitioner or I may terminate the telemedicine visit at any time. I understand that I have the right to inspect all information obtained and/or recorded in the course of the telemedicine visit and may receive copies of available information for a reasonable fee.  I understand that some of the potential risks of receiving the Services via telemedicine include:   Delay or interruption in medical evaluation due to technological equipment failure or disruption;  Information transmitted may not be sufficient (e.g. poor resolution of images) to allow for appropriate medical decision making by the Practitioner; and/or   In rare instances, security protocols could fail, causing a breach of personal health information.  Furthermore, I acknowledge that it is my responsibility to provide information about my medical history, conditions and care that is complete and accurate to the best of my ability. I acknowledge that Practitioner's advice, recommendations, and/or decision may be based on factors not within their control, such as incomplete or inaccurate data provided by me or distortions of diagnostic images or specimens that may result from electronic transmissions. I  understand that the practice of medicine is not an exact science and that Practitioner makes no warranties or guarantees regarding treatment outcomes. I acknowledge that I will receive a copy of this consent concurrently upon execution via email to the email address I last provided but may also request a printed copy by calling the office of [facility].    I understand that my insurance will be billed for this visit.   I have read or had this consent read to me.  I understand the contents of this consent, which adequately explains the benefits and risks of the Services being provided via telemedicine.   I have been provided ample opportunity to ask questions regarding this consent and the Services and have had my questions  answered to my satisfaction.  I give my informed consent for the services to be provided through the use of telemedicine in my medical care  By participating in this telemedicine visit I agree to the above.

## 2018-05-12 ENCOUNTER — Encounter: Payer: Self-pay | Admitting: Cardiovascular Disease

## 2018-05-12 ENCOUNTER — Other Ambulatory Visit: Payer: Self-pay

## 2018-05-12 ENCOUNTER — Telehealth (INDEPENDENT_AMBULATORY_CARE_PROVIDER_SITE_OTHER): Payer: Medicare Other | Admitting: Cardiovascular Disease

## 2018-05-12 VITALS — BP 97/47 | HR 65 | Ht 61.0 in | Wt 106.6 lb

## 2018-05-12 DIAGNOSIS — I739 Peripheral vascular disease, unspecified: Secondary | ICD-10-CM

## 2018-05-12 NOTE — Addendum Note (Signed)
Addended by: Lamar Laundry on: 05/12/2018 11:02 AM   Modules accepted: Orders

## 2018-05-12 NOTE — Progress Notes (Signed)
Virtual Visit via Video Note   This visit type was conducted due to national recommendations for restrictions regarding the COVID-19 Pandemic (e.g. social distancing) in an effort to limit this patient's exposure and mitigate transmission in our community.  Due to her co-morbid illnesses, this patient is at least at moderate risk for complications without adequate follow up.  This format is felt to be most appropriate for this patient at this time.  All issues noted in this document were discussed and addressed.  A limited physical exam was performed with this format.  Please refer to the patient's chart for her consent to telehealth for [facility].   Evaluation Performed:  Follow-up visit  Date:  05/12/2018   ID:  Kimberly [name], Newborn 09-09-1945, MRN [id number]  Patient Location: Home Provider Location: Office  PCP:  Biagio Borg, MD  Cardiologist:  No primary care provider on file.  Electrophysiologist:  None   Chief Complaint: Follow-up regarding peripheral arterial disease  History of Present Illness:    Kimberly [name] is a 73 y.o. female who was evaluated today via a web visit. She is followed by me for peripheral arterial disease and followed closely at the heart failure clinic.  She has history of congenital heart disease status post VSD repair with multiple prior catheterizations via bilateral femoral arteries when she was a child, SVT status post ablation, hyperlipidemia, COPD, tobacco use, transient atrial fibrillation and chronic systolic heart failure. Patient was seen in 2018 for peripheral arterial disease with nonhealing wound on the right lateral leg above the ankle. She underwent recent noninvasive vascular evaluation which showed a right ABI of 0.62 and left of 0.72. I proceeded with an angiogram in November, 2018 which showed moderate left common iliac artery stenosis, short occlusion of right common femoral artery with no significant infrainguinal disease and short  occlusion of the left common femoral artery with no significant infrainguinal disease.  I performed successful drug-coated balloon angioplasty and self-expanding stent placement to the right common femoral artery without complications.  She had no recurrent wounds since then. Most recent lower extremity arterial Doppler in 2019 showed slight decrease in ABI on the right with evidence of severe restenosis in the proximal portion of the right common femoral artery stent.  He has been doing well with no recurrent ulceration or claudication.  No chest pain.  She describes stable exertional dyspnea.  The patient does not have symptoms concerning for COVID-19 infection (fever, chills, cough, or new shortness of breath).    Past Medical History:  Diagnosis Date  . AICD (automatic cardioverter/defibrillator) present 03/17/2017  . Anxiety   . Atrial tachycardia ([place])   . Bursitis of shoulder, right, adhesive   . CHF (congestive heart failure) ([place])   . Chronic bronchitis ([place])    "1-2 times/yr" (01/23/2014)  . COPD (chronic obstructive pulmonary disease) ([place])   . CVD (cerebrovascular disease)   . Dyslipidemia   . Dysrhythmia   . Frequency of urination   . GERD (gastroesophageal reflux disease)   . Heart murmur   . History of stomach ulcers   . HTN (hypertension) 02/22/2011  . Migraines    "stopped many years ago" (06/14/2014)  . Osteoporosis 08/19/2016  . Pericarditis   . Pneumonia "10 times" (06/14/2014)  . 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 Silent myocardial infarction ([place]) "late 1990's"  . Stress incontinence    "was suppose to have been tacked up years ago but I didn't  do it"  . Syncope, near    Associated with atrial tachycardia-event recorder 1/16  . Thoracic outlet syndrome   . VSD (ventricular septal defect)    Past Surgical History:  Procedure Laterality Date  . ABDOMINAL AORTOGRAM W/LOWER EXTREMITY N/A 12/17/2016   Procedure:  ABDOMINAL AORTOGRAM W/LOWER EXTREMITY;  Surgeon: Wellington Hampshire, MD;  Location: [facility];  Service: Cardiovascular;  Laterality: N/A;  . BIV ICD INSERTION CRT-D N/A 03/17/2017   Procedure: BIV ICD INSERTION CRT-D;  Surgeon: Evans Lance, MD;  Location: [facility];  Service: Cardiovascular;  Laterality: N/A;  . CARDIAC CATHETERIZATION  "quite a few"  . [place]  . CHOLECYSTECTOMY OPEN  1970's  . CORONARY ANGIOGRAM  2000   No significant CAD  . ELECTROPHYSIOLOGIC STUDY N/A 06/14/2014   Procedure: A-Flutter/A-Tach/SVT Ablation;  Surgeon: Evans Lance, MD;  Location: [facility];  Service: Cardiovascular;  Laterality: N/A;  . INSERTION OF ICD  03/17/2017   BIV  . MYRINGOTOMY WITH TUBE PLACEMENT Right 2015  . PERIPHERAL VASCULAR INTERVENTION  12/17/2016   Procedure: PERIPHERAL VASCULAR INTERVENTION;  Surgeon: Wellington Hampshire, MD;  Location: [facility];  Service: Cardiovascular;;  Right common femoral PTA and Stent  . RIGHT/LEFT HEART CATH AND CORONARY ANGIOGRAPHY Bilateral 11/06/2016   Procedure: RIGHT/LEFT HEART CATH AND CORONARY ANGIOGRAPHY;  Surgeon: Wellington Hampshire, MD;  Location: [facility];  Service: Cardiovascular;  Laterality: Bilateral;  . SVT ABLATION  06/14/2014  . TUBAL LIGATION  1972  . VSD REPAIR  1958; 1967     Current Meds  Medication Sig  . albuterol (VENTOLIN HFA) 108 (90 Base) MCG/ACT inhaler Inhale 2 puffs into the lungs every 6 (six) hours as needed for wheezing or shortness of breath.  . ALPRAZolam (XANAX) 0.25 MG tablet TAKE 1 TABLET BY MOUTH TWICE A DAY AS NEEDED FOR ANXIETY (Patient taking differently: Take 0.25 mg by mouth at bedtime as needed for sleep. )  . aspirin 81 MG tablet Take 1 tablet (81 mg total) by mouth daily.  . carvedilol (COREG) 3.125 MG tablet Take 2 tablets (6.25 mg total) by mouth 2 (two) times daily.  . celecoxib (CELEBREX) 100 MG capsule Take 100 mg by mouth 2 (two) times daily as  needed for moderate pain.  . chlorpheniramine-HYDROcodone (TUSSIONEX) 10-8 MG/5ML SUER TAKE 5 ML BY MOUTH EVERY 12 HOURS AS NEEDED FOR COUGH  . colchicine 0.6 MG tablet Take 1 tablet TID until GI upset or flare subsides, then for maintenance, take 1 tablet daily.  . digoxin (LANOXIN) 0.125 MG tablet Take 0.5 tablets (0.0625 mg total) by mouth daily.  Marland Kitchen ENTRESTO 24-26 MG TAKE 1 TABLET BY MOUTH TWICE A DAY  . furosemide (LASIX) 20 MG tablet Take 1 tablet (20 mg total) by mouth daily for 2 days, THEN 1 tablet (20 mg total) once a week.  Marland Kitchen ibuprofen (ADVIL,MOTRIN) 200 MG tablet Take 200 mg by mouth daily as needed for headache.  . rosuvastatin (CRESTOR) 10 MG tablet TAKE 1 TABLET BY MOUTH EVERY DAY  . spironolactone (ALDACTONE) 25 MG tablet Take 1 tablet (25 mg total) by mouth at bedtime.  Marland Kitchen tiotropium (SPIRIVA HANDIHALER) 18 MCG inhalation capsule Place 1 capsule (18 mcg total) into inhaler and inhale daily.     Allergies:   Mupirocin and Codeine   Social History   Tobacco Use  . Smoking status: Current Every Day Smoker    Packs/day: 0.33  Years: 35.00    Pack years: 11.55    Types: Cigarettes  . Smokeless tobacco: Never Used  . Tobacco comment: Smoked 1971-present , up to 1/2 ppd. Intermittent smoking cessation with preganancy up to 1/2-1 year @ a time  Substance Use Topics  . Alcohol use: Yes    Alcohol/week: 1.0 standard drinks    Types: 1 Glasses of wine per week    Comment: 06/14/2014 "glass of wine maybe once/month"  . Drug use: No     Family Hx: The patient's family history includes Alcohol abuse in an other family member; Arthritis in some other family members; Cancer in her father, mother, and another family member; Hypertension in her unknown relative and another family member; Stroke in her unknown relative and another family member; Throat cancer in her unknown relative. There is no history of Breast cancer.  ROS:   Please see the history of present illness.     All  other systems reviewed and are negative.   Prior CV studies:   The following studies were reviewed today:  I reviewed most recent vascular studies with her from last year  Labs/Other Tests and Data Reviewed:    EKG:  No ECG reviewed.  Recent Labs: 12/08/2017: ALT 12; Hemoglobin 13.9; Platelets 273 12/23/2017: B Natriuretic Peptide 185.0 01/29/2018: BUN 18; Creatinine, Ser 1.04; Potassium 4.3; Sodium 136   Recent Lipid Panel Lab Results  Component Value Date/Time   CHOL 162 07/28/2017 11:25 AM   TRIG 74 07/28/2017 11:25 AM   HDL 86 07/28/2017 11:25 AM   CHOLHDL 1.9 07/28/2017 11:25 AM   LDLCALC 61 07/28/2017 11:25 AM   LDLDIRECT 112.6 03/16/2013 10:57 AM    Wt Readings from Last 3 Encounters:  05/12/18 106 lb 9 oz (48.3 kg)  03/26/18 109 lb (49.4 kg)  01/29/18 106 lb 9.6 oz (48.4 kg)     Objective:    Vital Signs:  BP (!) 97/47   Pulse 65   Ht 5\' 1"  (1.549 m)   Wt 106 lb 9 oz (48.3 kg)   BMI 20.13 kg/m    VITAL SIGNS:  reviewed GEN:  no acute distress EYES:  sclerae anicteric, EOMI - Extraocular Movements Intact RESPIRATORY:  normal respiratory effort, symmetric expansion CARDIOVASCULAR:  no peripheral edema NEURO:  alert and oriented x 3, no obvious focal deficit PSYCH:  normal affect  ASSESSMENT & PLAN:    1.  Peripheral arterial disease .  Status post successful endovascular revascularization of the right common femoral artery.  No recurrent ulceration or claudication at the present time.  She is known to have restenosis in the common femoral artery stent and I will be following up on this in 3 months from now with vascular studies.  2.  Chronic systolic heart failure: Due to nonischemic cardiomyopathy.  She appears to be euvolemic and currently on optimal medical therapy followed at the heart failure clinic.  3.  Hyperlipidemia: Continue treatment with rosuvastatin with a target LDL of less than 70.  4.  Tobacco use: She has not been able to quit smoking  and I discussed with her the importance of smoking cessation.    COVID-19 Education: The signs and symptoms of COVID-19 were discussed with the patient and how to seek care for testing (follow up with PCP or arrange E-visit).  The importance of social distancing was discussed today.  Time:   Today, I have spent 22 minutes with the patient with telehealth technology discussing the above problems.  Medication Adjustments/Labs and Tests Ordered: Current medicines are reviewed at length with the patient today.  Concerns regarding medicines are outlined above.   Tests Ordered: No orders of the defined types were placed in this encounter.   Medication Changes: No orders of the defined types were placed in this encounter.   Disposition:  Follow up in 6 month(s)  Signed, Kathlyn Sacramento, MD  05/12/2018 10:30 AM    [place]

## 2018-05-12 NOTE — Patient Instructions (Signed)
Medication Instructions:  Continue same medications If you need a refill on your cardiac medications before your next appointment, please call your pharmacy.   Lab work: None If you have labs (blood work) drawn today and your tests are completely normal, you will receive your results only by: Marland Kitchen MyChart Message (if you have MyChart) OR . A paper copy in the mail If you have any lab test that is abnormal or we need to change your treatment, we will call you to review the results.  Testing/Procedures: Lower extremity arterial duplex and ABI to be done in 3 months  Follow-Up: At [facility], you and your health needs are our priority.  As part of our continuing mission to provide you with exceptional heart care, we have created designated Provider Care Teams.  These Care Teams include your primary Cardiologist (physician) and Advanced Practice Providers (APPs -  Physician Assistants and Nurse Practitioners) who all work together to provide you with the care you need, when you need it. You will need a follow up appointment in 6 months.  Please call our office 2 months in advance to schedule this appointment.

## 2018-05-24 ENCOUNTER — Other Ambulatory Visit: Payer: Self-pay | Admitting: Internal Medicine

## 2018-05-24 NOTE — Telephone Encounter (Signed)
Done erx 

## 2018-05-24 NOTE — Telephone Encounter (Signed)
[place] Controlled Database Checked Last filled: 03/22/18 # 60 LOV w/you: 11/06/17 Next appt w/you: None

## 2018-06-09 ENCOUNTER — Other Ambulatory Visit (HOSPITAL_COMMUNITY): Payer: Self-pay

## 2018-06-09 MED ORDER — SPIRONOLACTONE 25 MG PO TABS: 25.0000 mg | ORAL_TABLET | Freq: Every day | ORAL | 3 refills | Status: DC

## 2018-06-16 ENCOUNTER — Ambulatory Visit (INDEPENDENT_AMBULATORY_CARE_PROVIDER_SITE_OTHER): Payer: Medicare Other | Admitting: *Deleted

## 2018-06-16 DIAGNOSIS — I420 Dilated cardiomyopathy: Secondary | ICD-10-CM | POA: Diagnosis not present

## 2018-06-16 LAB — CUP PACEART REMOTE DEVICE CHECK
Date Time Interrogation Session: 20200527204627
Implantable Lead Implant Date: 20190226
Implantable Lead Implant Date: 20190226
Implantable Lead Implant Date: 20190226
Implantable Lead Location: 753858
Implantable Lead Location: 753859
Implantable Lead Location: 753860
Implantable Lead Model: 292
Implantable Lead Model: 4671
Implantable Lead Model: 7740
Implantable Lead Serial Number: 444536
Implantable Lead Serial Number: 726363
Implantable Lead Serial Number: 805938
Implantable Pulse Generator Implant Date: 20190226
Pulse Gen Serial Number: 204176

## 2018-06-24 ENCOUNTER — Telehealth (HOSPITAL_COMMUNITY): Payer: Self-pay | Admitting: Cardiology

## 2018-06-24 ENCOUNTER — Encounter: Payer: Self-pay | Admitting: Cardiology

## 2018-06-24 NOTE — Progress Notes (Signed)
Remote ICD transmission.   

## 2018-06-24 NOTE — Telephone Encounter (Signed)
Called and left message for patient that we are cancelling 06/28/2018 appt with Dr. Aundra Dubin and she has been added to his waitlist.  Advised pt if she is having any problems at this time, to call main clinic number and choose option 2 to leave message for the Nurse.  Otherwise we will contact her once we are scheduling patients in-person with Dr. Aundra Dubin

## 2018-06-28 ENCOUNTER — Encounter (HOSPITAL_COMMUNITY): Payer: Medicare Other | Admitting: Cardiology

## 2018-07-11 ENCOUNTER — Encounter: Payer: Self-pay | Admitting: Internal Medicine

## 2018-07-12 ENCOUNTER — Telehealth: Payer: Self-pay | Admitting: *Deleted

## 2018-07-12 DIAGNOSIS — Z20822 Contact with and (suspected) exposure to covid-19: Secondary | ICD-10-CM

## 2018-07-12 NOTE — Telephone Encounter (Signed)
Spoke with patient.   Per her request, scheduled her at [facility] building for testing tomorrow 07/13/2018 at 10 am.  Reviewed testing protocol with patient, she expressed understanding.

## 2018-07-12 NOTE — Telephone Encounter (Signed)
Staff to refer pt to [facility] testing pool

## 2018-07-12 NOTE — Telephone Encounter (Signed)
-----   Message from Cresenciano Lick, [place] sent at 07/12/2018  1:42 PM EDT ----- Regarding: COVID19 test Patient needs testing per Dr. Jenny Reichmann for potential exposure and fever. Thanks!

## 2018-07-13 ENCOUNTER — Other Ambulatory Visit (HOSPITAL_COMMUNITY): Payer: Self-pay | Admitting: Cardiology

## 2018-07-13 ENCOUNTER — Other Ambulatory Visit: Payer: Medicare Other

## 2018-07-13 DIAGNOSIS — Z20822 Contact with and (suspected) exposure to covid-19: Secondary | ICD-10-CM

## 2018-07-13 DIAGNOSIS — R6889 Other general symptoms and signs: Secondary | ICD-10-CM | POA: Diagnosis not present

## 2018-07-16 ENCOUNTER — Telehealth (HOSPITAL_COMMUNITY): Payer: Self-pay | Admitting: Licensed Clinical Social Worker

## 2018-07-16 MED ORDER — ENTRESTO 24-26 MG PO TABS: 1.0000 | ORAL_TABLET | Freq: Two times a day (BID) | ORAL | 3 refills | Status: DC

## 2018-07-16 NOTE — Telephone Encounter (Signed)
Received [employer] form off fax.  Prescriber form to be completed and signed when MD in office.

## 2018-07-16 NOTE — Addendum Note (Signed)
Addended by: Valeda Malm on: 07/16/2018 04:09 PM   Modules accepted: Orders

## 2018-07-16 NOTE — Telephone Encounter (Signed)
CSW had received call from pt regarding concerns with Delene Loll cost as her PAN foundation grant is about to end.  CSW explained that PAN foundation is not currently open so we are unable to apply for a 2nd grant at this time.  CSW explained [employer] as only other available option for assistance and emailed pt a copy.  Patient completed her portion and is mailing to the clinic for physician portion to be completed.  CSW will continue to follow and assist as needed  Jorge Ny, [facility] Desk#: [phone] Cell#: [phone]

## 2018-07-17 LAB — NOVEL CORONAVIRUS, NAA: SARS-CoV-2, NAA: NOT DETECTED

## 2018-07-19 ENCOUNTER — Telehealth (HOSPITAL_COMMUNITY): Payer: Self-pay

## 2018-07-19 NOTE — Telephone Encounter (Signed)
Pt assistance forms and prescription faxed to [employer]. Confirmation received.

## 2018-07-19 NOTE — Telephone Encounter (Signed)
-----   Message from Jorge Ny, [place] sent at 07/16/2018 12:03 PM EDT ----- Heads up that this patient is going to be faxing in her portion of the [employer] application today to the MD side fax machine- we need to complete physician portion and get MD script and send it in :)

## 2018-07-22 ENCOUNTER — Telehealth (HOSPITAL_COMMUNITY): Payer: Self-pay | Admitting: Pharmacy Technician

## 2018-07-22 ENCOUNTER — Telehealth (HOSPITAL_COMMUNITY): Payer: Self-pay | Admitting: Licensed Clinical Social Worker

## 2018-07-22 NOTE — Telephone Encounter (Signed)
Advanced Heart Failure Patient Advocate Encounter  Received notification that application for [employer] Patient [place] (NPAF) has been approved through the remainder of the calendar year.   ID: [id number]  Jodelle Gross (CPhT) [place] Patient Advocate  07/22/2018 9:15 AM

## 2018-07-22 NOTE — Telephone Encounter (Signed)
CSW informed that pt has been approved to receive Entresto through [employer].  CSW called pt and informed of approval.  CSW provided pt with [employer] number and suggested she call them to confirm initial shipment if she doesn't hear from them by Monday.  CSW will continue to follow and assist as needed  Jorge Ny, [facility] Desk#: [phone] Cell#: [phone]

## 2018-08-09 ENCOUNTER — Other Ambulatory Visit: Payer: Self-pay | Admitting: Internal Medicine

## 2018-09-14 ENCOUNTER — Telehealth (HOSPITAL_COMMUNITY): Payer: Self-pay | Admitting: Cardiology

## 2018-09-14 NOTE — Telephone Encounter (Signed)
Patient called to request routine follow up with Dr Aundra Dubin and to have medication list reviewed fir diarrhea side effects. Patient has been having diarrhea x 2-3 months and was told by her local pharmacist that is could be coming from her medications.  Follow up for Dr Aundra Dubin given and will forward message to pharmacy team for further/detailed review of medications

## 2018-09-15 ENCOUNTER — Encounter: Payer: Medicare Other | Admitting: *Deleted

## 2018-09-16 ENCOUNTER — Other Ambulatory Visit: Payer: Self-pay

## 2018-09-16 MED ORDER — ROSUVASTATIN CALCIUM 10 MG PO TABS: 10.0000 mg | ORAL_TABLET | Freq: Every day | ORAL | 1 refills | Status: DC

## 2018-09-17 ENCOUNTER — Encounter: Payer: Self-pay | Admitting: Internal Medicine

## 2018-09-17 MED ORDER — HYDROCOD POLST-CPM POLST ER 10-8 MG/5ML PO SUER: ORAL | 0 refills | Status: DC

## 2018-09-17 NOTE — Telephone Encounter (Signed)
tussinex done erx

## 2018-09-20 ENCOUNTER — Ambulatory Visit (INDEPENDENT_AMBULATORY_CARE_PROVIDER_SITE_OTHER): Payer: Medicare Other | Admitting: *Deleted

## 2018-09-20 DIAGNOSIS — I420 Dilated cardiomyopathy: Secondary | ICD-10-CM

## 2018-09-20 NOTE — Telephone Encounter (Signed)
Pt did not answer - will try to call again

## 2018-09-21 NOTE — Telephone Encounter (Signed)
Called patient regarding her concerns about her medications causing GI upset/diarrhea. Patient did not answer. Left voicemail and will try again later this week.

## 2018-09-23 NOTE — Telephone Encounter (Signed)
Spoke with patient regarding her concerns that some of her medications could be causing her GI upset/diarrhea. Colchicine could be causing GI upset; however, she has not taken this medication in months. She is also on digoxin, which could cause GI upset at supratherapeutic levels. However, digoxin level checked on 03/26/2018 was normal at 0.6 ng/mL, so unlikely to be the cause. She does take NSAIDS (celebrex), but makes sure to take those with food. Her GI upset/diarrhea is not temporally related to medication intake. I informed Kimberly [name] that I do not think her medications are the cause (or at least the sole cause) of her GI upset. She will follow up with her PCP regarding alternate causes.   Audry Riles, PharmD, BCPS [facility] Failure [facility] Pharmacist [phone]

## 2018-09-27 LAB — CUP PACEART REMOTE DEVICE CHECK
Battery Remaining Longevity: 120 mo
Brady Statistic RA Percent Paced: 50 %
Brady Statistic RV Percent Paced: 97 %
Date Time Interrogation Session: 20200907212647
HighPow Impedance: 72 Ohm
Implantable Lead Implant Date: 20190226
Implantable Lead Implant Date: 20190226
Implantable Lead Implant Date: 20190226
Implantable Lead Location: 753858
Implantable Lead Location: 753859
Implantable Lead Location: 753860
Implantable Lead Model: 292
Implantable Lead Model: 4671
Implantable Lead Model: 7740
Implantable Lead Serial Number: 444536
Implantable Lead Serial Number: 726363
Implantable Lead Serial Number: 805938
Implantable Pulse Generator Implant Date: 20190226
Lead Channel Impedance Value: 451 Ohm
Lead Channel Impedance Value: 556 Ohm
Lead Channel Impedance Value: 853 Ohm
Lead Channel Sensing Intrinsic Amplitude: 2.5 mV
Lead Channel Setting Pacing Amplitude: 2 V
Lead Channel Setting Pacing Amplitude: 2.5 V
Lead Channel Setting Pacing Amplitude: 2.6 V
Lead Channel Setting Pacing Pulse Width: 0.4 ms
Lead Channel Setting Pacing Pulse Width: 0.4 ms
Lead Channel Setting Sensing Sensitivity: 0.5 mV
Lead Channel Setting Sensing Sensitivity: 1 mV
Pulse Gen Serial Number: 204176

## 2018-10-05 ENCOUNTER — Telehealth (HOSPITAL_COMMUNITY): Payer: Self-pay | Admitting: Pharmacy Technician

## 2018-10-05 NOTE — Telephone Encounter (Signed)
Received notification that PAN foundation grant would expire on 10/09. The fund is currently closed. Added patient to the PAN wait list. Patient has current [employer] help with Entresto. Called her to make sure that was going ok, she needed me to give her the phone number again so she could call for her refill.  Phone Number: [place], CPhT

## 2018-10-06 ENCOUNTER — Other Ambulatory Visit (HOSPITAL_COMMUNITY): Payer: Self-pay

## 2018-10-06 ENCOUNTER — Encounter: Payer: Self-pay | Admitting: Cardiology

## 2018-10-06 MED ORDER — SPIRONOLACTONE 25 MG PO TABS: 25.0000 mg | ORAL_TABLET | Freq: Every day | ORAL | 3 refills | Status: DC

## 2018-10-06 NOTE — Progress Notes (Signed)
Remote ICD transmission.   

## 2018-10-13 ENCOUNTER — Other Ambulatory Visit (HOSPITAL_COMMUNITY): Payer: Self-pay | Admitting: Cardiology

## 2018-10-20 ENCOUNTER — Encounter: Payer: Self-pay | Admitting: Internal Medicine

## 2018-10-20 DIAGNOSIS — Z1211 Encounter for screening for malignant neoplasm of colon: Secondary | ICD-10-CM | POA: Diagnosis not present

## 2018-10-20 LAB — IFOBT (OCCULT BLOOD): IFOBT: NEGATIVE

## 2018-10-23 ENCOUNTER — Other Ambulatory Visit: Payer: Self-pay | Admitting: Internal Medicine

## 2018-10-25 NOTE — Telephone Encounter (Signed)
Done erx  Please ask pt to make ROV for yearly visit for further refills

## 2018-10-29 ENCOUNTER — Other Ambulatory Visit: Payer: Self-pay | Admitting: Internal Medicine

## 2018-11-01 ENCOUNTER — Other Ambulatory Visit: Payer: Self-pay

## 2018-11-01 ENCOUNTER — Ambulatory Visit (HOSPITAL_COMMUNITY)
Admission: RE | Admit: 2018-11-01 | Discharge: 2018-11-01 | Disposition: A | Discharge status: Home / Self Care | Payer: Medicare Other | Source: Ambulatory Visit | Attending: Internal Medicine | Admitting: Internal Medicine

## 2018-11-01 ENCOUNTER — Encounter (HOSPITAL_COMMUNITY): Payer: Self-pay | Admitting: Internal Medicine

## 2018-11-01 VITALS — BP 110/60 | HR 62 | Wt 111.0 lb

## 2018-11-01 DIAGNOSIS — Z7982 Long term (current) use of aspirin: Secondary | ICD-10-CM | POA: Diagnosis not present

## 2018-11-01 DIAGNOSIS — F17200 Nicotine dependence, unspecified, uncomplicated: Secondary | ICD-10-CM

## 2018-11-01 DIAGNOSIS — I739 Peripheral vascular disease, unspecified: Secondary | ICD-10-CM | POA: Diagnosis not present

## 2018-11-01 DIAGNOSIS — I428 Other cardiomyopathies: Secondary | ICD-10-CM | POA: Diagnosis not present

## 2018-11-01 DIAGNOSIS — I5022 Chronic systolic (congestive) heart failure: Secondary | ICD-10-CM | POA: Insufficient documentation

## 2018-11-01 DIAGNOSIS — J449 Chronic obstructive pulmonary disease, unspecified: Secondary | ICD-10-CM | POA: Insufficient documentation

## 2018-11-01 DIAGNOSIS — I340 Nonrheumatic mitral (valve) insufficiency: Secondary | ICD-10-CM | POA: Diagnosis not present

## 2018-11-01 DIAGNOSIS — F419 Anxiety disorder, unspecified: Secondary | ICD-10-CM | POA: Diagnosis not present

## 2018-11-01 DIAGNOSIS — I2510 Atherosclerotic heart disease of native coronary artery without angina pectoris: Secondary | ICD-10-CM

## 2018-11-01 DIAGNOSIS — Z79899 Other long term (current) drug therapy: Secondary | ICD-10-CM | POA: Insufficient documentation

## 2018-11-01 DIAGNOSIS — Z8774 Personal history of (corrected) congenital malformations of heart and circulatory system: Secondary | ICD-10-CM | POA: Insufficient documentation

## 2018-11-01 DIAGNOSIS — Z87891 Personal history of nicotine dependence: Secondary | ICD-10-CM | POA: Diagnosis not present

## 2018-11-01 DIAGNOSIS — I480 Paroxysmal atrial fibrillation: Secondary | ICD-10-CM | POA: Insufficient documentation

## 2018-11-01 DIAGNOSIS — E785 Hyperlipidemia, unspecified: Secondary | ICD-10-CM | POA: Insufficient documentation

## 2018-11-01 DIAGNOSIS — Z8249 Family history of ischemic heart disease and other diseases of the circulatory system: Secondary | ICD-10-CM | POA: Insufficient documentation

## 2018-11-01 LAB — CBC
HCT: 38.7 % (ref 36.0–46.0)
Hemoglobin: 12.4 g/dL (ref 12.0–15.0)
MCH: 30.3 pg (ref 26.0–34.0)
MCHC: 32 g/dL (ref 30.0–36.0)
MCV: 94.6 fL (ref 80.0–100.0)
Platelets: 236 10*3/uL (ref 150–400)
RBC: 4.09 MIL/uL (ref 3.87–5.11)
RDW: 13.4 % (ref 11.5–15.5)
WBC: 6.1 10*3/uL (ref 4.0–10.5)
nRBC: 0 % (ref 0.0–0.2)

## 2018-11-01 LAB — BASIC METABOLIC PANEL
Anion gap: 10 (ref 5–15)
BUN: 14 mg/dL (ref 8–23)
CO2: 26 mmol/L (ref 22–32)
Calcium: 8.8 mg/dL — ABNORMAL LOW (ref 8.9–10.3)
Chloride: 102 mmol/L (ref 98–111)
Creatinine, Ser: 0.95 mg/dL (ref 0.44–1.00)
GFR calc Af Amer: >60 mL/min (ref >60)
GFR calc non Af Amer: >60 mL/min (ref >60)
Glucose, Bld: 126 mg/dL — ABNORMAL HIGH (ref 70–99)
Potassium: 4.4 mmol/L (ref 3.5–5.1)
Sodium: 138 mmol/L (ref 135–145)

## 2018-11-01 LAB — BRAIN NATRIURETIC PEPTIDE: B Natriuretic Peptide: 331.3 pg/mL — ABNORMAL HIGH (ref 0.0–100.0)

## 2018-11-01 LAB — DIGOXIN LEVEL: Digoxin Level: 0.4 ng/mL — ABNORMAL LOW (ref 0.8–2.0)

## 2018-11-01 NOTE — Progress Notes (Signed)
ADVANCED HF CLINIC NOTE  PCP: Dr. Jenny Reichmann Cardiology: Dr. Harrington Challenger HF Cardiology: Dr. Aundra Dubin Pulmonary: Dr Melvyn Novas    HPI:  73 y.o.with history of VSD repair as a child, COPD/active smoking, PAD, and chronic systolic CHF was referred by Dr. Harrington Challenger for evaluation of CHF.   Patient has a long history of cardiomyopathy.  She had VSD repaired as a child and imaging has not showed residual VSD.  Cardiac MRI in 11/09 showed EF 36%.  Echo in 12/15 showed EF 40-45%.  Echo in 3/18 showed EF down to 15-20% with moderate RV systolic dysfunction.  RHC/LHC in 10/18 showed nonobstructive coronary but was concerning for low output (CI 1.33).  She had a right lower leg slowly healing ulcer.  She had peripheral arteriogram with bilateral occluded CFAs. She had PCI to right CFA.    CPX in 1/19 showed moderate to severe functional limitation, combination of HF and lung disease, probably more related to the lung disease.  PFTs in 12/18 showed severe COPD.   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    7/19 peripheral arterial dopplers showed significant in-stent restenosis right SFA stent.  She returns today for HF follow up. Says she is doing pretty well. Taking lasix only as needed. Usually about once a week on Mondays. Remains on spiro. Says she has had diarrhea for 5 months with 2-5 BMs per day. Occasional mild CP no change. Has mild exertional dyspnea. Says her dyspnea is worse if she gets irritated. Says she stopped smoking yesterday. Was smoking 6-7 cigs/day. No edema, orthopnea or PND. No ICD firings. Does get dizzy when she stands up.    [name] device interrogation: Heart Logic Score 3. no AF/AT. No VT/VF. Active 1.5 hours/day. 96% BiV pacing. Personally reviewed.   Labs (7/18): LDL 61 Labs (11/18): K 4.2, creatinine 0.7, hgb 13.7 Labs (1/19): K 4.3, creatinine 1.07 Labs (2/19): K 4.5, creatinine 1.14 Labs (7/19): K 4.4, creatinine 1.04, LDL 61, digoxin 1.2 Labs (9/19): digoxin 0.7, K  4.8, creatinine 0.93  PMH:  1. VSD: s/p repair at [facility] as child.  From description, sounds like muscular VSD.  2. Atrial tachycardia s/p ablation.  3. Hyperlipidemia 4. COPD: Active smoker.  - PFTs (12/18): Severe obstructive lung disease.  5. Atrial fibrillation: Paroxysmal.   Noted only transiently.   6. PAD: ABIs 11/18 with 0.62 on right, 0.72 on left.  - Angiogram 11/18 showed totally occluded bilateral CFAs.  Patient had stent to right CFA.  ABIs in 12/18 were normal on right. - Peripheral arterial dopplers (7/19) with significant in-stent restenosis right CFA.   7. Chronic systolic CHF: Nonischemic cardiomyopathy.   - cMRI (11/09): EF 36%, VSD patch in anterior ventricular septum - Echo (12/15): EF 40-45% - Echo (3/18): EF 15-20%, moderate LV dilation, moderate MR, moderate RV dilation with moderately decreased systolic function.  - LHC/RHC (10/18): 3+ MR, EF < 25%, minimal nonobstructive CAD; PA 41/21, LVEDP 18, CI 1.33.  - CPX (1/19): peak VO2 13.4, VE/VCO2 slope 43, RER 1.07.  Mod-severe functional limitation due to combination of HF and lung disease, probably more related to lung disease.  - [name] CRT-D device.  - Echo (7/19): EF 25-30%, mild LV dilation, mild MR, normal RV size and systolic function.  8. PVCs: Zio patch 9/19 with 6% PVCs.   SH: Active smoker, lives in [place], married.  FH: Mother with PAD, sister died at birth from congenital heart disease.   Review of systems complete and  found to be negative unless listed in HPI.   Current Outpatient Medications  Medication Sig Dispense Refill   albuterol (VENTOLIN HFA) 108 (90 Base) MCG/ACT inhaler INHALE 2 PUFFS INTO THE LUNGS EVERY 6 (SIX) HOURS AS NEEDED FOR WHEEZING OR SHORTNESS OF BREATH 8.5 g 5   ALPRAZolam (XANAX) 0.25 MG tablet TAKE 1 TABLET BY MOUTH TWICE A DAY AS NEEDED FOR ANXIETY 60 tablet 0   aspirin 81 MG tablet Take 1 tablet (81 mg total) by mouth daily. 100 tablet 99   carvedilol  (COREG) 3.125 MG tablet Take 2 tablets (6.25 mg total) by mouth 2 (two) times daily. 360 tablet 3   celecoxib (CELEBREX) 100 MG capsule TAKE 1 CAPSULE BY MOUTH TWO TIMES DAILY AS NEEDED 60 capsule 2   chlorpheniramine-HYDROcodone (TUSSIONEX) 10-8 MG/5ML SUER TAKE 5 ML BY MOUTH EVERY 12 HOURS AS NEEDED FOR COUGH 115 mL 0   colchicine 0.6 MG tablet Take 1 tablet TID until GI upset or flare subsides, then for maintenance, take 1 tablet daily. 90 tablet 3   digoxin (LANOXIN) 0.125 MG tablet TAKE 1/2 TABLET BY MOUTH DAILY 45 tablet 0   furosemide (LASIX) 20 MG tablet Take 1 tablet (20 mg total) by mouth daily for 2 days, THEN 1 tablet (20 mg total) once a week. 30 tablet 11   ibuprofen (ADVIL,MOTRIN) 200 MG tablet Take 200 mg by mouth daily as needed for headache.     rosuvastatin (CRESTOR) 10 MG tablet Take 1 tablet (10 mg total) by mouth daily. 90 tablet 1   sacubitril-valsartan (ENTRESTO) 24-26 MG Take 1 tablet by mouth 2 (two) times daily. 180 tablet 3   spironolactone (ALDACTONE) 25 MG tablet Take 1 tablet (25 mg total) by mouth at bedtime. 30 tablet 3   tiotropium (SPIRIVA HANDIHALER) 18 MCG inhalation capsule Place 1 capsule (18 mcg total) into inhaler and inhale daily. 30 capsule 12   No current facility-administered medications for this encounter.     BP 110/60    Pulse 62    Wt 50.3 kg (111 lb)    SpO2 97%    BMI 20.97 kg/m   . Wt Readings from Last 3 Encounters:  05/12/18 48.3 kg (106 lb 9 oz)  03/26/18 49.4 kg (109 lb)  01/29/18 48.4 kg (106 lb 9.6 oz)   Physical exam:  General:  Walked into clinic. No resp difficulty HEENT: normal Neck: supple. no JVD. Carotids 2+ bilat; no bruits. No lymphadenopathy or thryomegaly appreciated. Cor: PMI nondisplaced. Regular rate & rhythm. No rubs, gallops or murmurs. Lungs: clear with decreased BS throughout  Abdomen: soft, nontender, nondistended. No hepatosplenomegaly. No bruits or masses. Good bowel sounds. Extremities: no  cyanosis, clubbing, rash, edema Neuro: alert & orientedx3, cranial nerves grossly intact. moves all 4 extremities w/o difficulty. Affect pleasant   Assessment/Plan:  1. Chronic systolic CHF: Nonischemic cardiomyopathy.  RHC/LHC in 10/18 showed no nonobstructive coronary disease but CI was very low at 1.33.  Her low cardiac output was out of proportion to her symptoms.  [name] CRT-D device.  CPX suggested moderate-severe functional limitation, but probably more due to COPD than CHF.  Echo in 7/19 with EF 25-30%.   Heart Logic score 3. NYHA III. BiV pacing 96%.  - Echo 10/19 EF 35-40% - Volume status stable. Continue lasix 20 mg every Monday. She can take an extra 20 mg PRN. - Continue Entresto 24/26 bid.  - Continue Coreg 3.125 mg bid.  - Continue spironolactone 25 mg daily. - Continue  digoxin. Check level today - Will not titrate meds with soft BP and episodes of dizziness.  -  LVAD would be a difficult proposition for her given severity of lung disease.    2.  PAD: She had PCI to occluded right CFA in 11/18, but 7/19 peripheral arterial dopplers showed significant in-stent restenosis in the right CFA. She has occluded left CFA also that was not intervened on. Mild claudication, no pedal ulcers.  - Discussed need for complete smoking cessation  - Continue statin.  - Follows with Dr. Fletcher Anon.   3. CAD:  - Nonobstructive on 10/18 cath.  - Has some occasional CP with emotional stress but this is unchanged. No exertional angina  - Continue  ASA 81 and statin.   4. Hyperlipidemia: Good lipids in 7/19.  Continue Crestor.  No change.   5. COPD: Severe by 12/18 PFTs. COPD plays a significant role in her dyspnea. Previously walked in hallway and O2 did not drop.   6. Smoking: - Discussed need for smoking cessation. Could not tolerate chantix. Now wearing nicotine patch  7. Mitral regurgitation: Only trivial on most recent echo.  Glori Bickers, MD  12:51 PM

## 2018-11-01 NOTE — Patient Instructions (Signed)
Labs today We will only contact you if something comes back abnormal or we need to make some changes. Otherwise no news is good news!  Your physician recommends that you schedule a follow-up appointment in: 6 months with Dr Aundra Dubin.  You will get a call to schedule this appointment  At the [facility], you and your health needs are our priority. As part of our continuing mission to provide you with exceptional heart care, we have created designated Provider Care Teams. These Care Teams include your primary Cardiologist (physician) and Advanced Practice Providers (APPs- Physician Assistants and Nurse Practitioners) who all work together to provide you with the care you need, when you need it.   You may see any of the following providers on your designated Care Team at your next follow up: Marland Kitchen Dr Glori Bickers . Dr Loralie Champagne . Darrick Grinder, NP . Lyda Jester, PA   Please be sure to bring in all your medications bottles to every appointment.

## 2018-11-04 ENCOUNTER — Other Ambulatory Visit (HOSPITAL_COMMUNITY): Payer: Self-pay | Admitting: Cardiology

## 2018-11-09 ENCOUNTER — Other Ambulatory Visit: Payer: Self-pay

## 2018-11-09 ENCOUNTER — Ambulatory Visit (INDEPENDENT_AMBULATORY_CARE_PROVIDER_SITE_OTHER): Payer: Medicare Other

## 2018-11-09 DIAGNOSIS — I739 Peripheral vascular disease, unspecified: Secondary | ICD-10-CM

## 2018-11-11 ENCOUNTER — Encounter: Payer: Self-pay | Admitting: Cardiovascular Disease

## 2018-11-11 ENCOUNTER — Other Ambulatory Visit: Payer: Self-pay

## 2018-11-11 ENCOUNTER — Ambulatory Visit (INDEPENDENT_AMBULATORY_CARE_PROVIDER_SITE_OTHER): Payer: Medicare Other | Admitting: Cardiovascular Disease

## 2018-11-11 VITALS — BP 100/60 | HR 63 | Temp 97.3°F | Ht 61.0 in | Wt 111.1 lb

## 2018-11-11 DIAGNOSIS — I10 Essential (primary) hypertension: Secondary | ICD-10-CM

## 2018-11-11 DIAGNOSIS — I739 Peripheral vascular disease, unspecified: Secondary | ICD-10-CM | POA: Diagnosis not present

## 2018-11-11 DIAGNOSIS — Z720 Tobacco use: Secondary | ICD-10-CM

## 2018-11-11 DIAGNOSIS — I5022 Chronic systolic (congestive) heart failure: Secondary | ICD-10-CM | POA: Diagnosis not present

## 2018-11-11 NOTE — Progress Notes (Signed)
Cardiology Office Note   Date:  11/11/2018   ID:  [name], Kimberly Dec 03, 1945, MRN [id number]  PCP:  Biagio Borg, MD  Cardiologist: Dr. Harrington Challenger  Chief Complaint  Patient presents with  . other    6 month follow up and discuss results of LE arterial.  Meds reviewed by the pt. verbally. Pt. c/o shortness of breath with over exertion and LE edema.       History of Present Illness: Kimberly [name] is a 73 y.o. female who is here today for a follow-up visit regarding peripheral arterial disease.   She is followed by me for peripheral arterial disease and followed closely at the heart failure clinic.  She has history of congenital heart disease status post VSD repair with multiple prior catheterizations via bilateral femoral arteries when she was a child, SVT status post ablation, hyperlipidemia, COPD, tobacco use, transient atrial fibrillation and chronic systolic heart failure. Patient was seen in 2018 for peripheral arterial disease with nonhealing wound on the right lateral leg above the ankle. She underwent recent noninvasive vascular evaluation which showed a right ABI of 0.62 and left of 0.72. Angiography in 2018 showed moderate left common iliac artery stenosis, short occlusion of right common femoral artery with no significant infrainguinal disease and short occlusion of the left common femoral artery with no significant infrainguinal disease.  I performed successful drug-coated balloon angioplasty and self-expanding stent placement to the right common femoral artery without complications.  She had no recurrent wounds since then. She developed severe restenosis in the right common femoral artery but remained asymptomatic and thus has been monitored closely.  She is trying to quit smoking and currently using a nicotine patch.  No chest pain.  Shortness of breath is stable.  She denies leg claudication and has no lower extremity wounds.  Past Medical History:  Diagnosis Date  . AICD  (automatic cardioverter/defibrillator) present 03/17/2017  . Anxiety   . Atrial tachycardia ([place])   . Bursitis of shoulder, right, adhesive   . CHF (congestive heart failure) ([place])   . Chronic bronchitis ([place])    "1-2 times/yr" (01/23/2014)  . COPD (chronic obstructive pulmonary disease) ([place])   . CVD (cerebrovascular disease)   . Dyslipidemia   . Dysrhythmia   . Frequency of urination   . GERD (gastroesophageal reflux disease)   . Heart murmur   . History of stomach ulcers   . HTN (hypertension) 02/22/2011  . Migraines    "stopped many years ago" (06/14/2014)  . Osteoporosis 08/19/2016  . Pericarditis   . Pneumonia "10 times" (06/14/2014)  . 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 Silent myocardial infarction ([place]) "late 1990's"  . Stress incontinence    "was suppose to have been tacked up years ago but I didn't do it"  . Syncope, near    Associated with atrial tachycardia-event recorder 1/16  . Thoracic outlet syndrome   . VSD (ventricular septal defect)     Past Surgical History:  Procedure Laterality Date  . ABDOMINAL AORTOGRAM W/LOWER EXTREMITY N/A 12/17/2016   Procedure: ABDOMINAL AORTOGRAM W/LOWER EXTREMITY;  Surgeon: Wellington Hampshire, MD;  Location: [place] [facility];  Service: Cardiovascular;  Laterality: N/A;  . BIV ICD INSERTION CRT-D N/A 03/17/2017   Procedure: BIV ICD INSERTION CRT-D;  Surgeon: Evans Lance, MD;  Location: [place] [facility];  Service: Cardiovascular;  Laterality: N/A;  . CARDIAC CATHETERIZATION  "quite a few"  . CESAREAN SECTION  [place]  1970's  . CORONARY ANGIOGRAM  2000   No significant CAD  . ELECTROPHYSIOLOGIC STUDY N/A 06/14/2014   Procedure: A-Flutter/A-Tach/SVT Ablation;  Surgeon: Evans Lance, MD;  Location: [place] [facility];  Service: Cardiovascular;  Laterality: N/A;  . INSERTION OF ICD  03/17/2017   BIV  . MYRINGOTOMY WITH TUBE PLACEMENT Right 2015  . PERIPHERAL  VASCULAR INTERVENTION  12/17/2016   Procedure: PERIPHERAL VASCULAR INTERVENTION;  Surgeon: Wellington Hampshire, MD;  Location: [place] [facility];  Service: Cardiovascular;;  Right common femoral PTA and Stent  . RIGHT/LEFT HEART CATH AND CORONARY ANGIOGRAPHY Bilateral 11/06/2016   Procedure: RIGHT/LEFT HEART CATH AND CORONARY ANGIOGRAPHY;  Surgeon: Wellington Hampshire, MD;  Location: [place] [facility];  Service: Cardiovascular;  Laterality: Bilateral;  . SVT ABLATION  06/14/2014  . TUBAL LIGATION  1972  . VSD REPAIR  1958; 1967     Current Outpatient Medications  Medication Sig Dispense Refill  . albuterol (VENTOLIN HFA) 108 (90 Base) MCG/ACT inhaler INHALE 2 PUFFS INTO THE LUNGS EVERY 6 (SIX) HOURS AS NEEDED FOR WHEEZING OR SHORTNESS OF BREATH 8.5 g 5  . ALPRAZolam (XANAX) 0.25 MG tablet TAKE 1 TABLET BY MOUTH TWICE A DAY AS NEEDED FOR ANXIETY 60 tablet 0  . aspirin 81 MG tablet Take 1 tablet (81 mg total) by mouth daily. 100 tablet 99  . carvedilol (COREG) 3.125 MG tablet TAKE TWO (2) TABLETS TWICE A DAY 360 tablet 3  . celecoxib (CELEBREX) 100 MG capsule TAKE 1 CAPSULE BY MOUTH TWO TIMES DAILY AS NEEDED 60 capsule 2  . chlorpheniramine-HYDROcodone (TUSSIONEX) 10-8 MG/5ML SUER TAKE 5 ML BY MOUTH EVERY 12 HOURS AS NEEDED FOR COUGH 115 mL 0  . colchicine 0.6 MG tablet Take 1 tablet TID until GI upset or flare subsides, then for maintenance, take 1 tablet daily. 90 tablet 3  . digoxin (LANOXIN) 0.125 MG tablet TAKE 1/2 TABLET BY MOUTH DAILY 45 tablet 0  . furosemide (LASIX) 20 MG tablet Take 1 tablet (20 mg total) by mouth daily for 2 days, THEN 1 tablet (20 mg total) once a week. 30 tablet 11  . ibuprofen (ADVIL,MOTRIN) 200 MG tablet Take 200 mg by mouth daily as needed for headache.    . rosuvastatin (CRESTOR) 10 MG tablet Take 1 tablet (10 mg total) by mouth daily. 90 tablet 1  . sacubitril-valsartan (ENTRESTO) 24-26 MG Take 1 tablet by mouth 2 (two) times daily. 180 tablet 3  .  spironolactone (ALDACTONE) 25 MG tablet Take 1 tablet (25 mg total) by mouth at bedtime. 30 tablet 3  . tiotropium (SPIRIVA HANDIHALER) 18 MCG inhalation capsule Place 1 capsule (18 mcg total) into inhaler and inhale daily. 30 capsule 12   No current facility-administered medications for this visit.     Allergies:   Mupirocin and Codeine    Social History:  The patient  reports that she has been smoking cigarettes. She has a 11.55 pack-year smoking history. She has never used smokeless tobacco. She reports current alcohol use of about 1.0 standard drinks of alcohol per week. She reports that she does not use drugs.   Family History:  The patient's family history includes Alcohol abuse in an other family member; Arthritis in some other family members; Cancer in her father, mother, and another family member; Hypertension in her unknown relative and another family member; Stroke in her unknown relative and another family member; Throat cancer in her unknown relative.  ROS:  Please see the history of present illness.   Otherwise, review of systems are positive for none.   All other systems are reviewed and negative.    PHYSICAL EXAM: VS:  BP 100/60 (BP Location: Left Arm, Patient Position: Sitting, Cuff Size: Normal)   Pulse 63   Temp (!) 97.3 F (36.3 C)   Ht 5\' 1"  (1.549 m)   Wt 111 lb 2 oz (50.4 kg)   BMI 21.00 kg/m  , BMI Body mass index is 21 kg/m. GEN: Well nourished, well developed, in no acute distress  HEENT: normal  Neck: no JVD, carotid bruits, or masses Cardiac: RRR; no  rubs, or gallops,no edema.  3 out of 6 holosystolic murmur at the left sternal border and apex. Respiratory:  clear to auscultation bilaterally, normal work of breathing GI: soft, nontender, nondistended, + BS MS: no deformity or atrophy  Skin: warm and dry, no rash Neuro:  Strength and sensation are intact Psych: euthymic mood, full affect Vascular: Radial pulse is absent bilaterally.     EKG:  EKG  is ordered today. The ekg ordered today demonstrates sinus rhythm with ventricular paced rhythm.   Recent Labs: 12/08/2017: ALT 12 11/01/2018: B Natriuretic Peptide 331.3; BUN 14; Creatinine, Ser 0.95; Hemoglobin 12.4; Platelets 236; Potassium 4.4; Sodium 138    Lipid Panel    Component Value Date/Time   CHOL 162 07/28/2017 1125   TRIG 74 07/28/2017 1125   HDL 86 07/28/2017 1125   CHOLHDL 1.9 07/28/2017 1125   VLDL 15 07/28/2017 1125   LDLCALC 61 07/28/2017 1125   LDLDIRECT 112.6 03/16/2013 1057      Wt Readings from Last 3 Encounters:  11/11/18 111 lb 2 oz (50.4 kg)  11/01/18 111 lb (50.3 kg)  05/12/18 106 lb 9 oz (48.3 kg)       No flowsheet data found.    ASSESSMENT AND PLAN:  1.  Peripheral arterial disease .  Status post endovascular revascularization of the right common femoral artery with known restenosis.  In spite of that, she has no symptoms and thus we will continue to monitor for now.  2.  Chronic systolic heart failure: Due to nonischemic cardiomyopathy.  She appears to be euvolemic and currently on optimal medical therapy followed at the heart failure clinic.  3.  Hyperlipidemia: Continue treatment with rosuvastatin with a target LDL of less than 70.  4.  Tobacco use: She is using a nicotine patch to help her quit.  She has not smoked in 2 weeks.    Disposition:   FU with me in 6 month  Signed,  Kathlyn Sacramento, MD  11/11/2018 11:56 AM    [place]

## 2018-11-11 NOTE — Patient Instructions (Signed)
Medication Instructions:  Your physician recommends that you continue on your current medications as directed. Please refer to the Current Medication list given to you today.  *If you need a refill on your cardiac medications before your next appointment, please call your pharmacy*  Lab Work: None ordered If you have labs (blood work) drawn today and your tests are completely normal, you will receive your results only by: . MyChart Message (if you have MyChart) OR . A paper copy in the mail If you have any lab test that is abnormal or we need to change your treatment, we will call you to review the results.  Testing/Procedures: None ordered  Follow-Up: At [facility], you and your health needs are our priority.  As part of our continuing mission to provide you with exceptional heart care, we have created designated Provider Care Teams.  These Care Teams include your primary Cardiologist (physician) and Advanced Practice Providers (APPs -  Physician Assistants and Nurse Practitioners) who all work together to provide you with the care you need, when you need it.  Your next appointment:   6 month(s)  The format for your next appointment:   In Person  Provider:    You may see Dr. Arida or one of the following Advanced Practice Providers on your designated Care Team:    Christopher Berge, NP  Ryan Dunn, PA-C  Jacquelyn Visser, PA-C   Other Instructions N/A  

## 2018-11-13 NOTE — Progress Notes (Addendum)
[facility] [place], [place] [address] Phone: [phone] Subjective:   I Kimberly [name] am serving as a Education administrator for Dr. Hulan Saas.   CC: Left shoulder pain  [id number]   08/18/2016 Patient does have arthritic changes of the knee. Likely contributing to most small Baker cyst. Patient declined aspiration. Home exercises given today, discussed icing regimen, patient will try conservative therapy with topical anti-inflammatories. Follow-up again in 4-6 weeks. You got to be getting 11/15/2018 Kimberly [name] is a 73 y.o. female coming in with complaint of left shoulder pain. Pain radiates to the deltoid muscle. Has a history of injections in the shoulder. DDD in the neck.   Onset- Chronic  Location - AC joint, posterior Character- Sharp  Aggravating factors- flexion, sleeping on it Reliving factors-  Therapies tried- flexion  Severity-  8/10 at its worse      Past Medical History:  Diagnosis Date  . AICD (automatic cardioverter/defibrillator) present 03/17/2017  . Anxiety   . Atrial tachycardia ([place])   . Bursitis of shoulder, right, adhesive   . CHF (congestive heart failure) ([place])   . Chronic bronchitis ([place])    "1-2 times/yr" (01/23/2014)  . COPD (chronic obstructive pulmonary disease) ([place])   . CVD (cerebrovascular disease)   . Dyslipidemia   . Dysrhythmia   . Frequency of urination   . GERD (gastroesophageal reflux disease)   . Heart murmur   . History of stomach ulcers   . HTN (hypertension) 02/22/2011  . Migraines    "stopped many years ago" (06/14/2014)  . Osteoporosis 08/19/2016  . Pericarditis   . Pneumonia "10 times" (06/14/2014)  . 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 Silent myocardial infarction ([place]) "late 1990's"  . Stress incontinence    "was suppose to have been tacked up years ago but I didn't do it"  . Syncope, near    Associated with atrial tachycardia-event recorder  1/16  . Thoracic outlet syndrome   . VSD (ventricular septal defect)    Past Surgical History:  Procedure Laterality Date  . ABDOMINAL AORTOGRAM W/LOWER EXTREMITY N/A 12/17/2016   Procedure: ABDOMINAL AORTOGRAM W/LOWER EXTREMITY;  Surgeon: Wellington Hampshire, MD;  Location: [facility];  Service: Cardiovascular;  Laterality: N/A;  . BIV ICD INSERTION CRT-D N/A 03/17/2017   Procedure: BIV ICD INSERTION CRT-D;  Surgeon: Evans Lance, MD;  Location: [facility];  Service: Cardiovascular;  Laterality: N/A;  . CARDIAC CATHETERIZATION  "quite a few"  . [place]  . CHOLECYSTECTOMY OPEN  1970's  . CORONARY ANGIOGRAM  2000   No significant CAD  . ELECTROPHYSIOLOGIC STUDY N/A 06/14/2014   Procedure: A-Flutter/A-Tach/SVT Ablation;  Surgeon: Evans Lance, MD;  Location: [facility];  Service: Cardiovascular;  Laterality: N/A;  . INSERTION OF ICD  03/17/2017   BIV  . MYRINGOTOMY WITH TUBE PLACEMENT Right 2015  . PERIPHERAL VASCULAR INTERVENTION  12/17/2016   Procedure: PERIPHERAL VASCULAR INTERVENTION;  Surgeon: Wellington Hampshire, MD;  Location: [facility];  Service: Cardiovascular;;  Right common femoral PTA and Stent  . RIGHT/LEFT HEART CATH AND CORONARY ANGIOGRAPHY Bilateral 11/06/2016   Procedure: RIGHT/LEFT HEART CATH AND CORONARY ANGIOGRAPHY;  Surgeon: Wellington Hampshire, MD;  Location: [facility];  Service: Cardiovascular;  Laterality: Bilateral;  . SVT ABLATION  06/14/2014  . TUBAL LIGATION  1972  . VSD REPAIR  1958; 50   Social History  Socioeconomic History  . Marital status: Married    Spouse name: Not on file  . Number of children: Not on file  . Years of education: 55  . Highest education level: Not on file  Occupational History  . Occupation: retired  Scientific laboratory technician  . Financial resource strain: Not on file  . Food insecurity    Worry: Not on file    Inability: Not on file  . Transportation needs    Medical: Not on  file    Non-medical: Not on file  Tobacco Use  . Smoking status: Current Every Day Smoker    Packs/day: 0.33    Years: 35.00    Pack years: 11.55    Types: Cigarettes  . Smokeless tobacco: Never Used  . Tobacco comment: Smoked 1971-present , up to 1/2 ppd. Intermittent smoking cessation with preganancy up to 1/2-1 year @ a time  Substance and Sexual Activity  . Alcohol use: Yes    Alcohol/week: 1.0 standard drinks    Types: 1 Glasses of wine per week    Comment: 06/14/2014 "glass of wine maybe once/month"  . Drug use: No  . Sexual activity: Never  Lifestyle  . Physical activity    Days per week: Not on file    Minutes per session: Not on file  . Stress: Not on file  Relationships  . Social Herbalist on phone: Not on file    Gets together: Not on file    Attends religious service: Not on file    Active member of club or organization: Not on file    Attends meetings of clubs or organizations: Not on file    Relationship status: Not on file  Other Topics Concern  . Not on file  Social History Narrative   Pt lives with husband.   Allergies  Allergen Reactions  . Mupirocin Other (See Comments)    Burning, pain, swelling and sob  . Codeine Nausea Only   Family History  Problem Relation Age of Onset  . Cancer Mother   . Cancer Father   . Throat cancer Unknown   . Hypertension Unknown   . Stroke Unknown   . Alcohol abuse Other   . Arthritis Other   . Cancer Other        lung cancer  . Hypertension Other   . Arthritis Other   . Stroke Other   . Breast cancer Neg Hx      Current Outpatient Medications (Cardiovascular):  .  carvedilol (COREG) 3.125 MG tablet, TAKE TWO (2) TABLETS TWICE A DAY .  digoxin (LANOXIN) 0.125 MG tablet, TAKE 1/2 TABLET BY MOUTH DAILY .  rosuvastatin (CRESTOR) 10 MG tablet, Take 1 tablet (10 mg total) by mouth daily. .  sacubitril-valsartan (ENTRESTO) 24-26 MG, Take 1 tablet by mouth 2 (two) times daily. Marland Kitchen  spironolactone  (ALDACTONE) 25 MG tablet, Take 1 tablet (25 mg total) by mouth at bedtime. .  furosemide (LASIX) 20 MG tablet, Take 1 tablet (20 mg total) by mouth daily for 2 days, THEN 1 tablet (20 mg total) once a week.  Current Outpatient Medications (Respiratory):  .  albuterol (VENTOLIN HFA) 108 (90 Base) MCG/ACT inhaler, INHALE 2 PUFFS INTO THE LUNGS EVERY 6 (SIX) HOURS AS NEEDED FOR WHEEZING OR SHORTNESS OF BREATH .  chlorpheniramine-HYDROcodone (TUSSIONEX) 10-8 MG/5ML SUER, TAKE 5 ML BY MOUTH EVERY 12 HOURS AS NEEDED FOR COUGH .  tiotropium (SPIRIVA HANDIHALER) 18 MCG inhalation capsule, Place 1 capsule (18 mcg  total) into inhaler and inhale daily.  Current Outpatient Medications (Analgesics):  .  aspirin 81 MG tablet, Take 1 tablet (81 mg total) by mouth daily. .  celecoxib (CELEBREX) 100 MG capsule, TAKE 1 CAPSULE BY MOUTH TWO TIMES DAILY AS NEEDED .  colchicine 0.6 MG tablet, Take 1 tablet TID until GI upset or flare subsides, then for maintenance, take 1 tablet daily. Marland Kitchen  ibuprofen (ADVIL,MOTRIN) 200 MG tablet, Take 200 mg by mouth daily as needed for headache.   Current Outpatient Medications (Other):  Marland Kitchen  ALPRAZolam (XANAX) 0.25 MG tablet, TAKE 1 TABLET BY MOUTH TWICE A DAY AS NEEDED FOR ANXIETY    Past medical history, social, surgical and family history all reviewed in electronic medical record.  No pertanent information unless stated regarding to the chief complaint.   Review of Systems:  No headache, visual changes, nausea, vomiting, diarrhea, constipation, dizziness, abdominal pain, skin rash, fevers, chills, night sweats, weight loss, swollen lymph nodes, body aches, joint swelling,  chest pain, shortness of breath, mood changes.  Positive muscle aches  Objective  Blood pressure 90/70, pulse (!) 55, height 5\' 1"  (1.549 m), weight 113 lb (51.3 kg), SpO2 96 %.   General: No apparent distress alert and oriented x3 mood and affect normal, dressed appropriately.  HEENT: Pupils equal,  extraocular movements intact  Respiratory: Patient's speak in full sentences and does not appear short of breath  Cardiovascular: No lower extremity edema, non tender, no erythema  Skin: Warm dry intact with no signs of infection or rash on extremities or on axial skeleton.  Abdomen: Soft nontender  Neuro: Cranial nerves II through XII are intact, neurovascularly intact in all extremities with 2+ DTRs and 2+ pulses.  Lymph: No lymphadenopathy of posterior or anterior cervical chain or axillae bilaterally.  Gait normal with good balance and coordination.  MSK: Significant arthritic changes of multiple joints Left shoulder exam shows significant atrophy noted on the musculature.  Patient has limited range of motion, significant weakness noted of the rotator cuff compared to the contralateral side with 3 out of 5 strength.  The patient does have crepitus and loss of range of motion in all planes.  Limited musculoskeletal ultrasound was performed and interpreted Lyndal Pulley  Limited ultrasound of patient's shoulder shows the patient does have moderate to severe arthritic changes.  The rotator cuff tear and worsening atrophy of the supraspinatus noted with calcific changes.  Patient does have moderate to severe acromioclavicular arthritis as well.  Impression: Rotator cuff arthropathy  Procedure: Real-time Ultrasound Guided Injection of left glenohumeral joint Device: GE Logiq E  Ultrasound guided injection is preferred based studies that show increased duration, increased effect, greater accuracy, decreased procedural pain, increased response rate with ultrasound guided versus blind injection.  Verbal informed consent obtained.  Time-out conducted.  Noted no overlying erythema, induration, or other signs of local infection.  Skin prepped in a sterile fashion.  Local anesthesia: Topical Ethyl chloride.  With sterile technique and under real time ultrasound guidance:  Joint visualized.  21g 2  inch needle inserted posterior approach. Pictures taken for needle placement. Patient did have injection of 2 cc of 0.5% Marcaine, and 1cc of Kenalog 40 mg/dL. Completed without difficulty  Pain immediately resolved suggesting accurate placement of the medication.  Advised to call if fevers/chills, erythema, induration, drainage, or persistent bleeding.  Images permanently stored and available for review in the ultrasound unit.  Impression: Technically successful ultrasound guided injection.  Procedure: Real-time Ultrasound Guided Injection of  left acromioclavicular joint Device: GE Logiq Q7 Ultrasound guided injection is preferred based studies that show increased duration, increased effect, greater accuracy, decreased procedural pain, increased response rate, and decreased cost with ultrasound guided versus blind injection.  Verbal informed consent obtained.  Time-out conducted.  Noted no overlying erythema, induration, or other signs of local infection.  Skin prepped in a sterile fashion.  Local anesthesia: Topical Ethyl chloride.  With sterile technique and under real time ultrasound guidance: With a 25-gauge half inch needle injected with 0.5 cc of 0.5% Marcaine and 0.5 cc of 40 mg/mL Completed without difficulty  Pain immediately resolved suggesting accurate placement of the medication.  Advised to call if fevers/chills, erythema, induration, drainage, or persistent bleeding.  Images permanently stored and available for review in the ultrasound unit.  Impression: Technically successful ultrasound guided injection.   [id number]; 15 additional minutes spent for Therapeutic exercises as stated in above notes.  This included exercises focusing on stretching, strengthening, with significant focus on eccentric aspects.   Long term goals include an improvement in range of motion, strength, endurance as well as avoiding reinjury. Patient's frequency would include in 1-2 times a day, 3-5 times a week  for a duration of 6-12 weeks. Shoulder Exercises that included:  Basic scapular stabilization to include adduction and depression of scapula Scaption, focusing on proper movement and good control Internal and External rotation utilizing a theraband, with elbow tucked at side entire time Rows with theraband  Which was givne   Proper technique shown and discussed handout in great detail with ATC.  All questions were discussed and answered.     Impression and Recommendations:     This case required medical decision making of moderate complexity. The above documentation has been reviewed and is accurate and complete Lyndal Pulley, DO       Note: This dictation was prepared with Dragon dictation along with smaller phrase technology. Any transcriptional errors that result from this process are unintentional.

## 2018-11-15 ENCOUNTER — Ambulatory Visit (INDEPENDENT_AMBULATORY_CARE_PROVIDER_SITE_OTHER): Payer: Medicare Other | Admitting: Family Medicine

## 2018-11-15 ENCOUNTER — Encounter: Payer: Self-pay | Admitting: Family Medicine

## 2018-11-15 ENCOUNTER — Ambulatory Visit: Payer: Self-pay

## 2018-11-15 ENCOUNTER — Other Ambulatory Visit: Payer: Self-pay

## 2018-11-15 ENCOUNTER — Ambulatory Visit (INDEPENDENT_AMBULATORY_CARE_PROVIDER_SITE_OTHER)
Admission: RE | Admit: 2018-11-15 | Discharge: 2018-11-15 | Disposition: A | Discharge status: Home / Self Care | Payer: Medicare Other | Source: Ambulatory Visit | Attending: Family Medicine | Admitting: Family Medicine

## 2018-11-15 VITALS — BP 90/70 | HR 55 | Ht 61.0 in | Wt 113.0 lb

## 2018-11-15 DIAGNOSIS — M25512 Pain in left shoulder: Secondary | ICD-10-CM | POA: Diagnosis not present

## 2018-11-15 DIAGNOSIS — M19012 Primary osteoarthritis, left shoulder: Secondary | ICD-10-CM | POA: Diagnosis not present

## 2018-11-15 DIAGNOSIS — M75102 Unspecified rotator cuff tear or rupture of left shoulder, not specified as traumatic: Secondary | ICD-10-CM | POA: Diagnosis not present

## 2018-11-15 DIAGNOSIS — G8929 Other chronic pain: Secondary | ICD-10-CM

## 2018-11-15 DIAGNOSIS — M19019 Primary osteoarthritis, unspecified shoulder: Secondary | ICD-10-CM | POA: Insufficient documentation

## 2018-11-15 DIAGNOSIS — M542 Cervicalgia: Secondary | ICD-10-CM | POA: Diagnosis not present

## 2018-11-15 DIAGNOSIS — M12812 Other specific arthropathies, not elsewhere classified, left shoulder: Secondary | ICD-10-CM

## 2018-11-15 NOTE — Assessment & Plan Note (Signed)
Patient given injection today, tolerated the procedure well, discussed icing regimen, discussed home exercises and work with Product/process development scientist, patient will try to increase activity as tolerated.  Follow-up with me again in 4 to 8 weeks

## 2018-11-15 NOTE — Assessment & Plan Note (Signed)
History of degenerative disc disease.  We discussed the possibility of x-rays but will hold at this moment.

## 2018-11-15 NOTE — Patient Instructions (Addendum)
Good to se you Injections today Xray downstairs Exercise 3 times a week Pennsaid small amount over the most painful spot twice a day See me again in 4-8 weeks

## 2018-11-15 NOTE — Assessment & Plan Note (Signed)
Acromioclavicular arthropathy.  Discussed which activities to avoid.  Topical anti-inflammatories given.  Follow-up again in 4 to 8 weeks

## 2018-11-20 IMAGING — DX DG CHEST 2V
2 series · 2 of 2 positions shown · non-contrast
Comparison: 04/10/2016

CLINICAL DATA: COPD exacerbation.  Recent pneumonia

EXAM:
CHEST  2 VIEW

[chest pa]
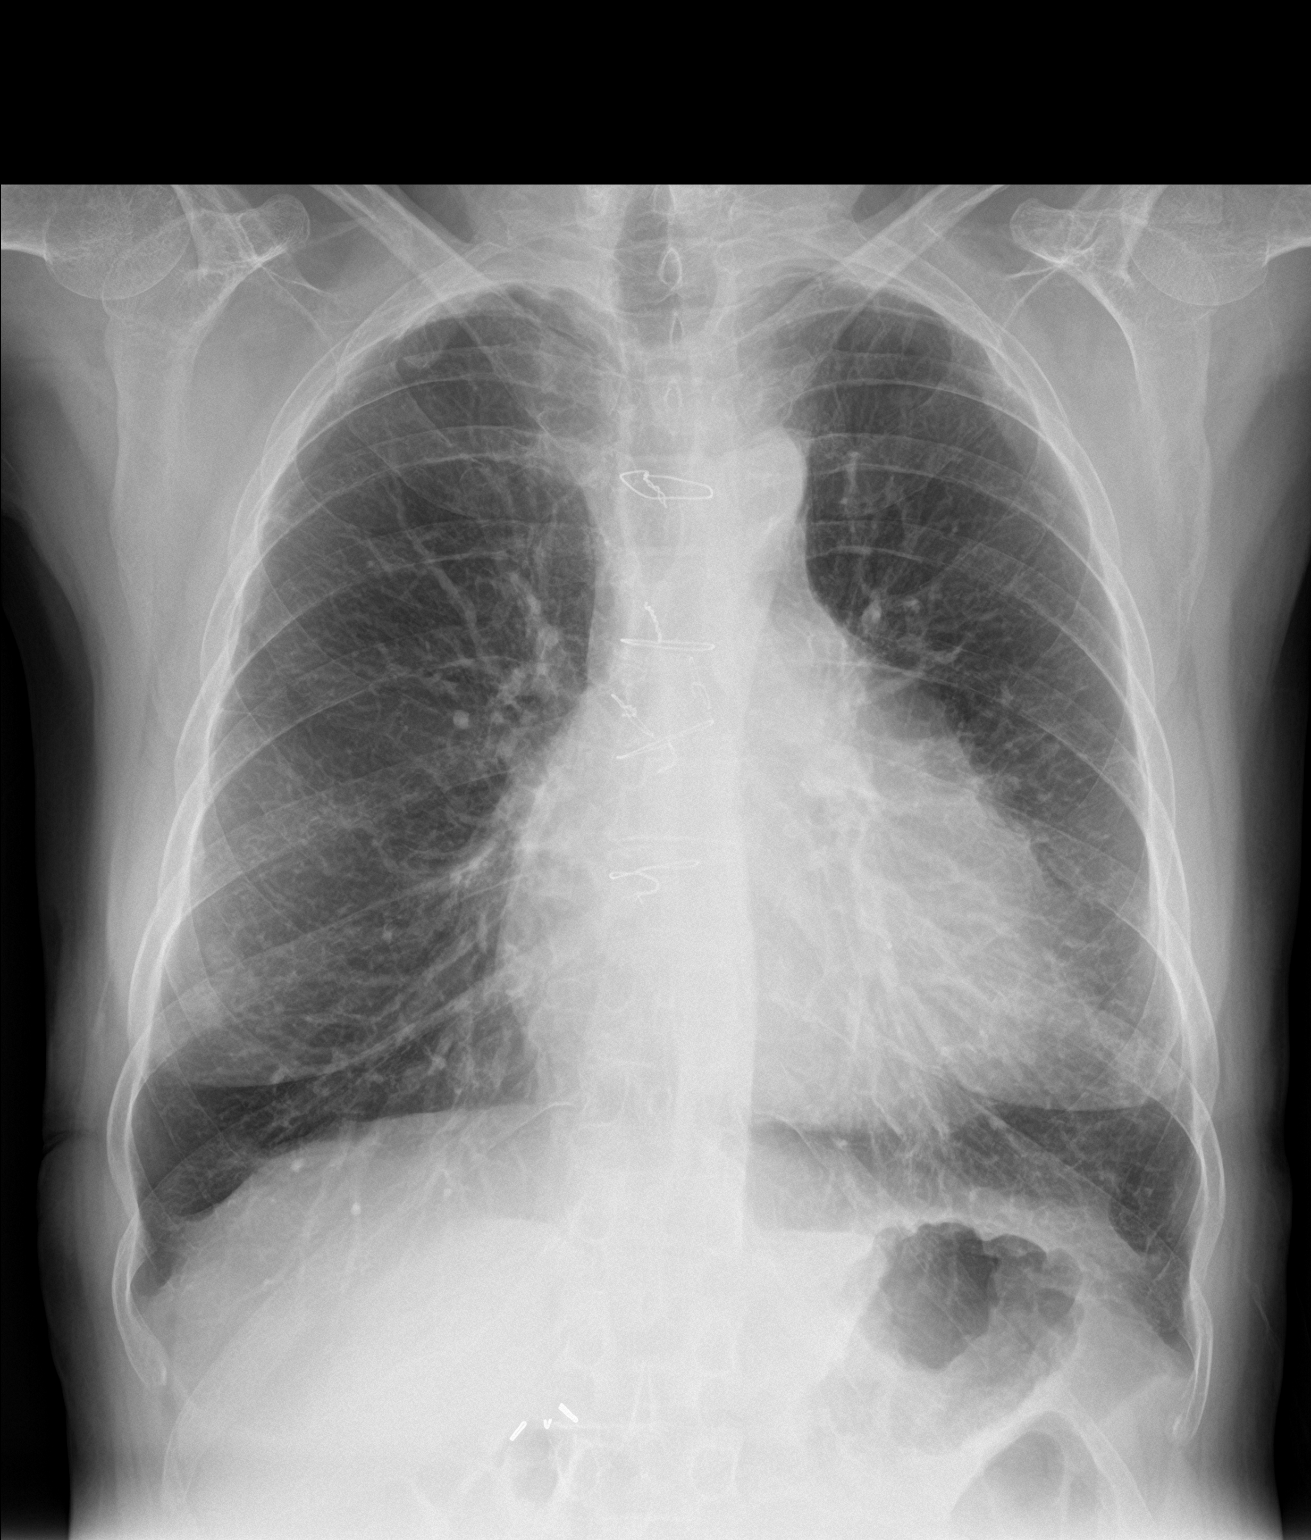

[chest lat]
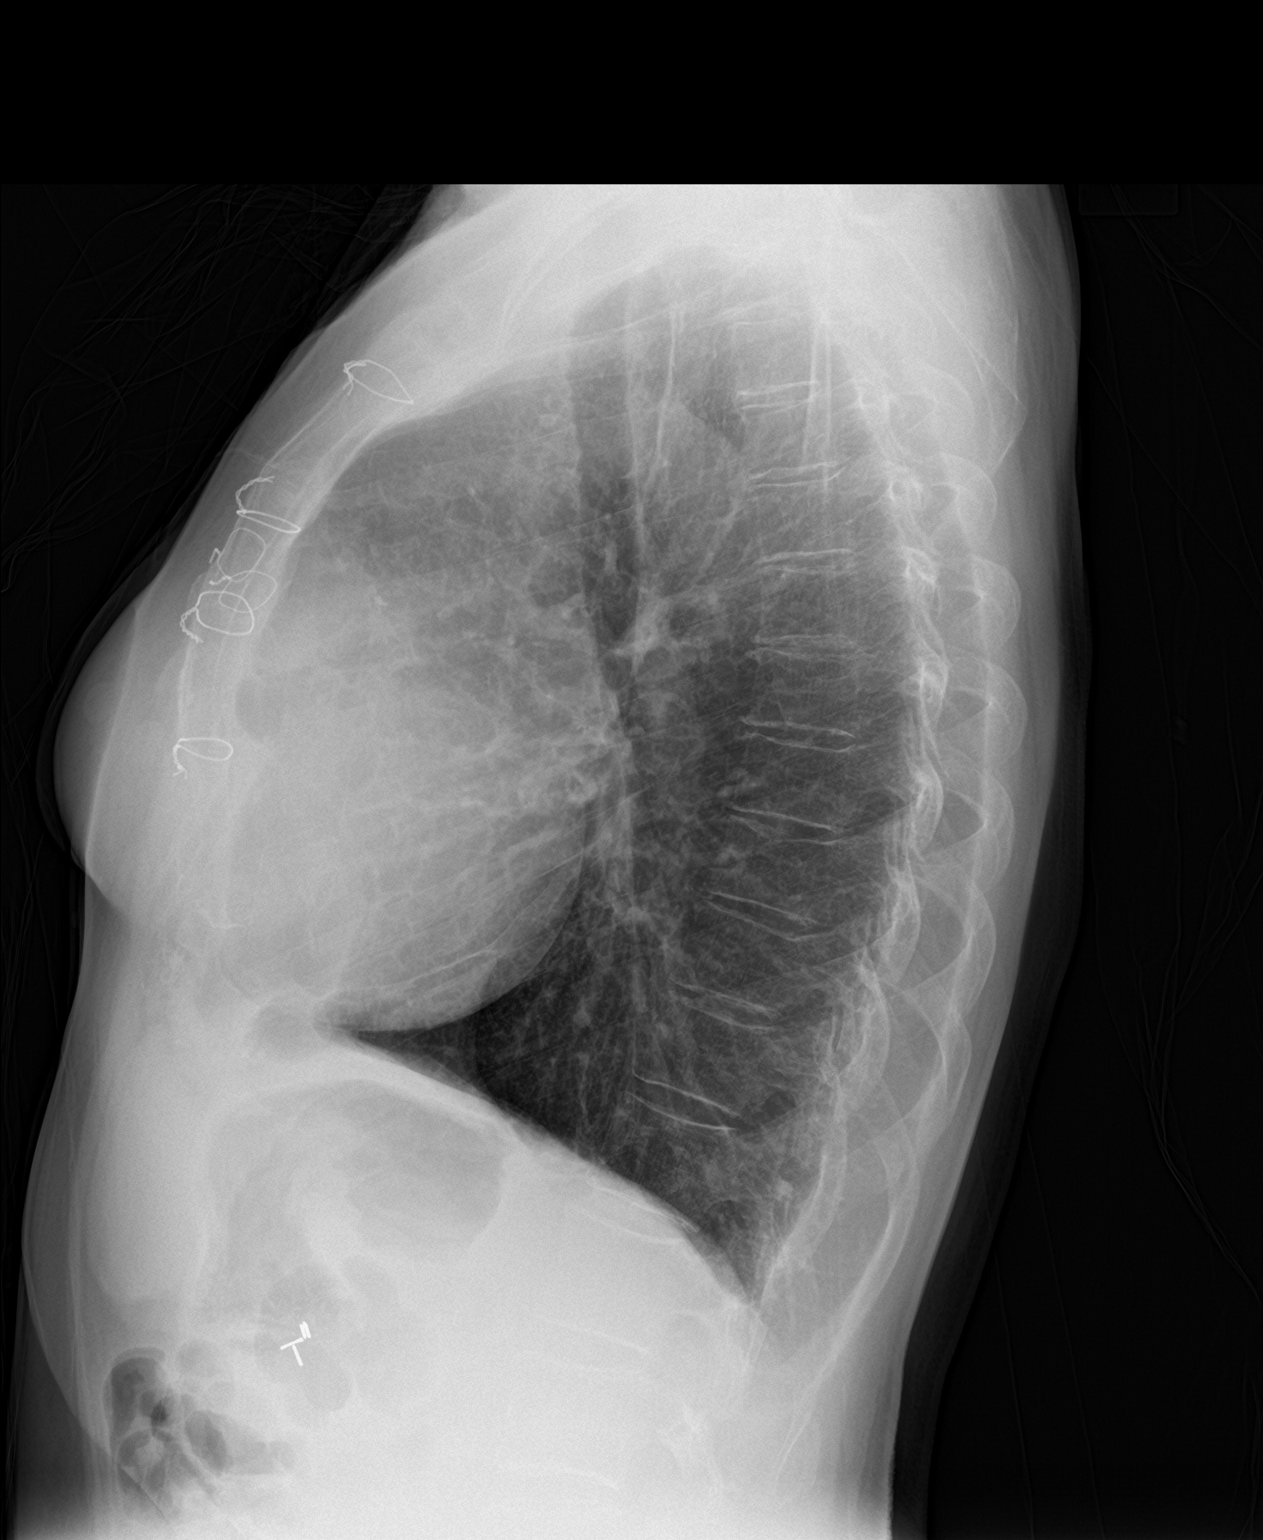

[2 of 2 positions shown; findings below may reference images not displayed]

FINDINGS: Clearing of lingular infiltrate since the prior study.

COPD with hyperinflation of the lungs. Prior median sternotomy.
Cardiac enlargement without heart failure. Negative for mass or
effusion. Apical scarring bilaterally.
IMPRESSION: Interval clearing of lingular pneumonia.

## 2018-11-25 ENCOUNTER — Telehealth (HOSPITAL_COMMUNITY): Payer: Self-pay | Admitting: Pharmacy Technician

## 2018-11-25 NOTE — Telephone Encounter (Signed)
Renewed patient grant for [employer].  Patient ID: [id number] RxBin ID: [id number] PCN: [id number] Group: [id number] Eligibility Start Date: 10/30/2018  Eligibility End Date: 10/29/2019 Amount: $1,000.00  Will call patient and inform her of approval and see which pharmacy she would like to use her grant at.  Charlann Boxer, CPhT

## 2018-11-30 ENCOUNTER — Telehealth (HOSPITAL_COMMUNITY): Payer: Self-pay | Admitting: Pharmacy Technician

## 2018-11-30 NOTE — Telephone Encounter (Signed)
Spoke to patient, she is aware that moving forward she is to get her Kimberly [name] from her local pharmacy and we are done using [employer] at this time. I reminded her that she needs to use her grant every 120 days to keep it active, even if she has some on hand leftover.   We will seek [employer] again in the future, as needed.  Charlann Boxer, CPhT

## 2018-11-30 NOTE — Telephone Encounter (Signed)
Have been unable to reach patient, called her pharmacy and had them go ahead and run a claim with her grant to keep it active.  Charlann Boxer, CPhT

## 2018-12-02 ENCOUNTER — Telehealth: Payer: Self-pay | Admitting: Internal Medicine

## 2018-12-02 ENCOUNTER — Encounter: Payer: Self-pay | Admitting: Internal Medicine

## 2018-12-02 MED ORDER — ALPRAZOLAM 0.25 MG PO TABS: ORAL_TABLET | ORAL | 0 refills | Status: DC

## 2018-12-02 NOTE — Telephone Encounter (Signed)
Patient called to let Dr. Jenny Reichmann know that she had her flu shot at [facility] on [address] in [place] in September.  She would like her chart to be marked.    Also, states that she did Cologuard test.  States it has been sent off for a month now.  Would like to know if Dr. Jenny Reichmann has those results.

## 2018-12-02 NOTE — Telephone Encounter (Signed)
Done erx  Please call pt - due for ROV please for further refills

## 2018-12-02 NOTE — Telephone Encounter (Signed)
Sorry, I dont recall those results; perhaps they have arrived but in the scanning process?

## 2018-12-02 NOTE — Telephone Encounter (Signed)
I have added her flu shot to her chart.   However, I do not see where a Cologuard has been resulted in EPIC. I also checked the Cologuard order website and do not see her entered. Do you recall any information on this?

## 2018-12-02 NOTE — Telephone Encounter (Signed)
Spoke to patient, she stated that she received it through [facility] and [employer] who had ordered it for her. She stated that she would try to find out some information for [place] over the next week to see if she could get those results to [place] so we can add it to her chart.

## 2018-12-20 ENCOUNTER — Ambulatory Visit (INDEPENDENT_AMBULATORY_CARE_PROVIDER_SITE_OTHER): Payer: Medicare Other | Admitting: *Deleted

## 2018-12-20 DIAGNOSIS — I5022 Chronic systolic (congestive) heart failure: Secondary | ICD-10-CM

## 2018-12-20 LAB — CUP PACEART REMOTE DEVICE CHECK
Date Time Interrogation Session: 20201130085759
Implantable Lead Implant Date: 20190226
Implantable Lead Implant Date: 20190226
Implantable Lead Implant Date: 20190226
Implantable Lead Location: 753858
Implantable Lead Location: 753859
Implantable Lead Location: 753860
Implantable Lead Model: 292
Implantable Lead Model: 4671
Implantable Lead Model: 7740
Implantable Lead Serial Number: 444536
Implantable Lead Serial Number: 726363
Implantable Lead Serial Number: 805938
Implantable Pulse Generator Implant Date: 20190226
Pulse Gen Serial Number: 204176

## 2018-12-28 ENCOUNTER — Encounter: Payer: Self-pay | Admitting: Internal Medicine

## 2018-12-28 ENCOUNTER — Other Ambulatory Visit (INDEPENDENT_AMBULATORY_CARE_PROVIDER_SITE_OTHER): Payer: Medicare Other

## 2018-12-28 ENCOUNTER — Other Ambulatory Visit: Payer: Self-pay

## 2018-12-28 ENCOUNTER — Encounter: Payer: Self-pay | Admitting: Family Medicine

## 2018-12-28 ENCOUNTER — Ambulatory Visit (INDEPENDENT_AMBULATORY_CARE_PROVIDER_SITE_OTHER): Payer: Medicare Other | Admitting: Family Medicine

## 2018-12-28 ENCOUNTER — Ambulatory Visit (INDEPENDENT_AMBULATORY_CARE_PROVIDER_SITE_OTHER): Payer: Medicare Other | Admitting: Internal Medicine

## 2018-12-28 VITALS — BP 90/70 | HR 61 | Ht 61.0 in | Wt 111.0 lb

## 2018-12-28 DIAGNOSIS — E538 Deficiency of other specified B group vitamins: Secondary | ICD-10-CM

## 2018-12-28 DIAGNOSIS — M12812 Other specific arthropathies, not elsewhere classified, left shoulder: Secondary | ICD-10-CM | POA: Diagnosis not present

## 2018-12-28 DIAGNOSIS — R739 Hyperglycemia, unspecified: Secondary | ICD-10-CM | POA: Diagnosis not present

## 2018-12-28 DIAGNOSIS — E611 Iron deficiency: Secondary | ICD-10-CM | POA: Diagnosis not present

## 2018-12-28 DIAGNOSIS — M75102 Unspecified rotator cuff tear or rupture of left shoulder, not specified as traumatic: Secondary | ICD-10-CM | POA: Diagnosis not present

## 2018-12-28 DIAGNOSIS — E559 Vitamin D deficiency, unspecified: Secondary | ICD-10-CM

## 2018-12-28 DIAGNOSIS — Z0000 Encounter for general adult medical examination without abnormal findings: Secondary | ICD-10-CM | POA: Diagnosis not present

## 2018-12-28 LAB — URINALYSIS, ROUTINE W REFLEX MICROSCOPIC
Bilirubin Urine: NEGATIVE
Ketones, ur: NEGATIVE
Leukocytes,Ua: NEGATIVE
Nitrite: NEGATIVE
Specific Gravity, Urine: 1.02 (ref 1.000–1.030)
Total Protein, Urine: NEGATIVE
Urine Glucose: NEGATIVE
Urobilinogen, UA: 0.2 (ref 0.0–1.0)
pH: 6 (ref 5.0–8.0)

## 2018-12-28 LAB — BASIC METABOLIC PANEL
BUN: 25 mg/dL — ABNORMAL HIGH (ref 6–23)
CO2: 29 mEq/L (ref 19–32)
Calcium: 9.7 mg/dL (ref 8.4–10.5)
Chloride: 100 mEq/L (ref 96–112)
Creatinine, Ser: 1.01 mg/dL (ref 0.40–1.20)
GFR: 53.9 mL/min — ABNORMAL LOW (ref >60.00)
Glucose, Bld: 81 mg/dL (ref 70–99)
Potassium: 4.7 mEq/L (ref 3.5–5.1)
Sodium: 135 mEq/L (ref 135–145)

## 2018-12-28 LAB — CBC WITH DIFFERENTIAL/PLATELET
Basophils Absolute: 0.1 10*3/uL (ref 0.0–0.1)
Basophils Relative: 1.3 % (ref 0.0–3.0)
Eosinophils Absolute: 0.1 10*3/uL (ref 0.0–0.7)
Eosinophils Relative: 1.6 % (ref 0.0–5.0)
HCT: 41.3 % (ref 36.0–46.0)
Hemoglobin: 13.9 g/dL (ref 12.0–15.0)
Lymphocytes Relative: 33.9 % (ref 12.0–46.0)
Lymphs Abs: 2.3 10*3/uL (ref 0.7–4.0)
MCHC: 33.7 g/dL (ref 30.0–36.0)
MCV: 92.1 fl (ref 78.0–100.0)
Monocytes Absolute: 0.5 10*3/uL (ref 0.1–1.0)
Monocytes Relative: 8 % (ref 3.0–12.0)
Neutro Abs: 3.8 10*3/uL (ref 1.4–7.7)
Neutrophils Relative %: 55.2 % (ref 43.0–77.0)
Platelets: 258 10*3/uL (ref 150.0–400.0)
RBC: 4.49 Mil/uL (ref 3.87–5.11)
RDW: 14.6 % (ref 11.5–15.5)
WBC: 6.8 10*3/uL (ref 4.0–10.5)

## 2018-12-28 LAB — LIPID PANEL
Cholesterol: 162 mg/dL (ref 0–200)
HDL: 87.3 mg/dL (ref >39.00)
LDL Cholesterol: 63 mg/dL (ref 0–99)
NonHDL: 74.24
Total CHOL/HDL Ratio: 2
Triglycerides: 54 mg/dL (ref 0.0–149.0)
VLDL: 10.8 mg/dL (ref 0.0–40.0)

## 2018-12-28 LAB — IBC PANEL
Iron: 93 ug/dL (ref 42–145)
Saturation Ratios: 28.9 % (ref 20.0–50.0)
Transferrin: 230 mg/dL (ref 212.0–360.0)

## 2018-12-28 LAB — HEPATIC FUNCTION PANEL
ALT: 13 U/L (ref 0–35)
AST: 16 U/L (ref 0–37)
Albumin: 4.5 g/dL (ref 3.5–5.2)
Alkaline Phosphatase: 69 U/L (ref 39–117)
Bilirubin, Direct: 0.2 mg/dL (ref 0.0–0.3)
Total Bilirubin: 0.8 mg/dL (ref 0.2–1.2)
Total Protein: 7.3 g/dL (ref 6.0–8.3)

## 2018-12-28 LAB — VITAMIN B12: Vitamin B-12: 202 pg/mL — ABNORMAL LOW (ref 211–911)

## 2018-12-28 LAB — VITAMIN D 25 HYDROXY (VIT D DEFICIENCY, FRACTURES): VITD: 16.28 ng/mL — ABNORMAL LOW (ref 30.00–100.00)

## 2018-12-28 LAB — TSH: TSH: 0.97 u[IU]/mL (ref 0.35–4.50)

## 2018-12-28 LAB — HEMOGLOBIN A1C: Hgb A1c MFr Bld: 6.2 % (ref 4.6–6.5)

## 2018-12-28 MED ORDER — ALPRAZOLAM 0.25 MG PO TABS: ORAL_TABLET | ORAL | 5 refills | Status: DC

## 2018-12-28 NOTE — Assessment & Plan Note (Signed)
stable overall by history and exam, recent data reviewed with pt, and pt to continue medical treatment as before,  to f/u any worsening symptoms or concerns  

## 2018-12-28 NOTE — Progress Notes (Signed)
Subjective:    Patient ID: Kimberly [name], female    DOB: [birth year], 73 y.o.   MRN: [id number]  HPI  Here for wellness and f/u;  Overall doing ok;  Pt denies Chest pain, worsening SOB, DOE, wheezing, orthopnea, PND, worsening LE edema, palpitations, dizziness or syncope.  Pt denies neurological change such as new headache, facial or extremity weakness.  Pt denies polydipsia, polyuria, or low sugar symptoms. Pt states overall good compliance with treatment and medications, good tolerability, and has been trying to follow appropriate diet.  Pt denies worsening depressive symptoms, suicidal ideation or panic. No fever, night sweats, wt loss, loss of appetite, or other constitutional symptoms.  Pt states good ability with ADL's, has low fall risk, home safety reviewed and adequate, no other significant changes in hearing or vision, and only occasionally active with exercise  Saw Dr Tamala Julian for left shoulder. .  Quit smoking with the nicotine patches, now on the 7s.   No other new compoaints Past Medical History:  Diagnosis Date  . AICD (automatic cardioverter/defibrillator) present 03/17/2017  . Anxiety   . Atrial tachycardia ([place])   . Bursitis of shoulder, right, adhesive   . CHF (congestive heart failure) ([place])   . Chronic bronchitis ([place])    "1-2 times/yr" (01/23/2014)  . COPD (chronic obstructive pulmonary disease) ([place])   . CVD (cerebrovascular disease)   . Dyslipidemia   . Dysrhythmia   . Frequency of urination   . GERD (gastroesophageal reflux disease)   . Heart murmur   . History of stomach ulcers   . HTN (hypertension) 02/22/2011  . Migraines    "stopped many years ago" (06/14/2014)  . Osteoporosis 08/19/2016  . Pericarditis   . Pneumonia "10 times" (06/14/2014)  . 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 Silent myocardial infarction ([place]) "late 1990's"  . Stress incontinence    "was suppose to have been tacked up years ago but I didn't do it"  .  Syncope, near    Associated with atrial tachycardia-event recorder 1/16  . Thoracic outlet syndrome   . VSD (ventricular septal defect)    Past Surgical History:  Procedure Laterality Date  . ABDOMINAL AORTOGRAM W/LOWER EXTREMITY N/A 12/17/2016   Procedure: ABDOMINAL AORTOGRAM W/LOWER EXTREMITY;  Surgeon: Wellington Hampshire, MD;  Location: [facility];  Service: Cardiovascular;  Laterality: N/A;  . BIV ICD INSERTION CRT-D N/A 03/17/2017   Procedure: BIV ICD INSERTION CRT-D;  Surgeon: Evans Lance, MD;  Location: [facility];  Service: Cardiovascular;  Laterality: N/A;  . CARDIAC CATHETERIZATION  "quite a few"  . [place]  . CHOLECYSTECTOMY OPEN  1970's  . CORONARY ANGIOGRAM  2000   No significant CAD  . ELECTROPHYSIOLOGIC STUDY N/A 06/14/2014   Procedure: A-Flutter/A-Tach/SVT Ablation;  Surgeon: Evans Lance, MD;  Location: [facility];  Service: Cardiovascular;  Laterality: N/A;  . INSERTION OF ICD  03/17/2017   BIV  . MYRINGOTOMY WITH TUBE PLACEMENT Right 2015  . PERIPHERAL VASCULAR INTERVENTION  12/17/2016   Procedure: PERIPHERAL VASCULAR INTERVENTION;  Surgeon: Wellington Hampshire, MD;  Location: [facility];  Service: Cardiovascular;;  Right common femoral PTA and Stent  . RIGHT/LEFT HEART CATH AND CORONARY ANGIOGRAPHY Bilateral 11/06/2016   Procedure: RIGHT/LEFT HEART CATH AND CORONARY ANGIOGRAPHY;  Surgeon: Wellington Hampshire, MD;  Location: [facility];  Service: Cardiovascular;  Laterality: Bilateral;  . SVT ABLATION  06/14/2014  . TUBAL  LIGATION  1972  . VSD REPAIR  1958; 1967    reports that she has been smoking cigarettes. She has a 11.55 pack-year smoking history. She has never used smokeless tobacco. She reports current alcohol use of about 1.0 standard drinks of alcohol per week. She reports that she does not use drugs. family history includes Alcohol abuse in an other family member; Arthritis in some other family members;  Cancer in her father, mother, and another family member; Hypertension in her unknown relative and another family member; Stroke in her unknown relative and another family member; Throat cancer in her unknown relative. Allergies  Allergen Reactions  . Mupirocin Other (See Comments)    Burning, pain, swelling and sob  . Codeine Nausea Only   Current Outpatient Medications on File Prior to Visit  Medication Sig Dispense Refill  . albuterol (VENTOLIN HFA) 108 (90 Base) MCG/ACT inhaler INHALE 2 PUFFS INTO THE LUNGS EVERY 6 (SIX) HOURS AS NEEDED FOR WHEEZING OR SHORTNESS OF BREATH 8.5 g 5  . aspirin 81 MG tablet Take 1 tablet (81 mg total) by mouth daily. 100 tablet 99  . carvedilol (COREG) 3.125 MG tablet TAKE TWO (2) TABLETS TWICE A DAY 360 tablet 3  . celecoxib (CELEBREX) 100 MG capsule TAKE 1 CAPSULE BY MOUTH TWO TIMES DAILY AS NEEDED 60 capsule 2  . chlorpheniramine-HYDROcodone (TUSSIONEX) 10-8 MG/5ML SUER TAKE 5 ML BY MOUTH EVERY 12 HOURS AS NEEDED FOR COUGH 115 mL 0  . colchicine 0.6 MG tablet Take 1 tablet TID until GI upset or flare subsides, then for maintenance, take 1 tablet daily. 90 tablet 3  . digoxin (LANOXIN) 0.125 MG tablet TAKE 1/2 TABLET BY MOUTH DAILY 45 tablet 0  . ibuprofen (ADVIL,MOTRIN) 200 MG tablet Take 200 mg by mouth daily as needed for headache.    . rosuvastatin (CRESTOR) 10 MG tablet Take 1 tablet (10 mg total) by mouth daily. 90 tablet 1  . sacubitril-valsartan (ENTRESTO) 24-26 MG Take 1 tablet by mouth 2 (two) times daily. 180 tablet 3  . spironolactone (ALDACTONE) 25 MG tablet Take 1 tablet (25 mg total) by mouth at bedtime. 30 tablet 3  . tiotropium (SPIRIVA HANDIHALER) 18 MCG inhalation capsule Place 1 capsule (18 mcg total) into inhaler and inhale daily. 30 capsule 12  . furosemide (LASIX) 20 MG tablet Take 1 tablet (20 mg total) by mouth daily for 2 days, THEN 1 tablet (20 mg total) once a week. 30 tablet 11   No current facility-administered medications on  file prior to visit.    Review of Systems Constitutional: Negative for other unusual diaphoresis, sweats, appetite or weight changes HENT: Negative for other worsening hearing loss, ear pain, facial swelling, mouth sores or neck stiffness.   Eyes: Negative for other worsening pain, redness or other visual disturbance.  Respiratory: Negative for other stridor or swelling Cardiovascular: Negative for other palpitations or other chest pain  Gastrointestinal: Negative for worsening diarrhea or loose stools, blood in stool, distention or other pain Genitourinary: Negative for hematuria, flank pain or other change in urine volume.  Musculoskeletal: Negative for myalgias or other joint swelling.  Skin: Negative for other color change, or other wound or worsening drainage.  Neurological: Negative for other syncope or numbness. Hematological: Negative for other adenopathy or swelling Psychiatric/Behavioral: Negative for hallucinations, other worsening agitation, SI, self-injury, or new decreased concentration All otherwise neg per pt     Objective:   Physical Exam BP 90/70   Pulse 61   Ht 5'  1" (1.549 m)   Wt 111 lb (50.3 kg)   SpO2 98%   BMI 20.97 kg/m  VS noted,  Constitutional: Pt is oriented to person, place, and time. Appears well-developed and well-nourished, in no significant distress and comfortable Head: Normocephalic and atraumatic  Eyes: Conjunctivae and EOM are normal. Pupils are equal, round, and reactive to light Right Ear: External ear normal without discharge Left Ear: External ear normal without discharge Nose: Nose without discharge or deformity Mouth/Throat: Oropharynx is without other ulcerations and moist  Neck: Normal range of motion. Neck supple. No JVD present. No tracheal deviation present or significant neck LA or mass Cardiovascular: Normal rate, regular rhythm, normal heart sounds and intact distal pulses.   Pulmonary/Chest: WOB normal and breath sounds without  rales or wheezing  Abdominal: Soft. Bowel sounds are normal. NT. No HSM  Musculoskeletal: Normal range of motion. Exhibits no edema Lymphadenopathy: Has no other cervical adenopathy.  Neurological: Pt is alert and oriented to person, place, and time. Pt has normal reflexes. No cranial nerve deficit. Motor grossly intact, Gait intact Skin: Skin is warm and dry. No rash noted or new ulcerations Psychiatric:  Has normal mood and affect. Behavior is normal without agitation All otherwise neg per pt Lab Results  Component Value Date   WBC 6.8 12/28/2018   HGB 13.9 12/28/2018   HCT 41.3 12/28/2018   PLT 258.0 12/28/2018   GLUCOSE 81 12/28/2018   CHOL 162 12/28/2018   TRIG 54.0 12/28/2018   HDL 87.30 12/28/2018   LDLDIRECT 112.6 03/16/2013   LDLCALC 63 12/28/2018   ALT 13 12/28/2018   AST 16 12/28/2018   NA 135 12/28/2018   K 4.7 12/28/2018   CL 100 12/28/2018   CREATININE 1.01 12/28/2018   BUN 25 (H) 12/28/2018   CO2 29 12/28/2018   TSH 0.97 12/28/2018   INR 1.02 12/15/2016   HGBA1C 6.2 12/28/2018          Assessment & Plan:

## 2018-12-28 NOTE — Progress Notes (Signed)
[facility] [address], [place] [address] Phone: [phone] Subjective:   I Kimberly [name] am serving as a Education administrator for Dr. Hulan Saas.   CC: Left shoulder pain follow-up  [id number]   11/15/2018 Patient given injection today, tolerated the procedure well, discussed icing regimen, discussed home exercises and work with Product/process development scientist, patient will try to increase activity as tolerated.  Follow-up with me again in 4 to 8 weeks  Acromioclavicular arthropathy.  Discussed which activities to avoid.  Topical anti-inflammatories given.  Follow-up again in 4 to 8 weeks  12/28/2018 Kimberly [name] is a 73 y.o. female coming in with complaint of left shoulder pain. Patient states she is a lot better. Completing exercises. Still using [place] when she feels pain.   Overall feels about 80% better.    Past Medical History:  Diagnosis Date  . AICD (automatic cardioverter/defibrillator) present 03/17/2017  . Anxiety   . Atrial tachycardia ([place])   . Bursitis of shoulder, right, adhesive   . CHF (congestive heart failure) ([place])   . Chronic bronchitis ([place])    "1-2 times/yr" (01/23/2014)  . COPD (chronic obstructive pulmonary disease) ([place])   . CVD (cerebrovascular disease)   . Dyslipidemia   . Dysrhythmia   . Frequency of urination   . GERD (gastroesophageal reflux disease)   . Heart murmur   . History of stomach ulcers   . HTN (hypertension) 02/22/2011  . Migraines    "stopped many years ago" (06/14/2014)  . Osteoporosis 08/19/2016  . Pericarditis   . Pneumonia "10 times" (06/14/2014)  . 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 Silent myocardial infarction ([place]) "late 1990's"  . Stress incontinence    "was suppose to have been tacked up years ago but I didn't do it"  . Syncope, near    Associated with atrial tachycardia-event recorder 1/16  . Thoracic outlet syndrome   . VSD (ventricular septal defect)    Past Surgical History:  Procedure Laterality Date  . ABDOMINAL AORTOGRAM W/LOWER EXTREMITY N/A 12/17/2016   Procedure: ABDOMINAL AORTOGRAM W/LOWER EXTREMITY;  Surgeon: Wellington Hampshire, MD;  Location: [facility];  Service: Cardiovascular;  Laterality: N/A;  . BIV ICD INSERTION CRT-D N/A 03/17/2017   Procedure: BIV ICD INSERTION CRT-D;  Surgeon: Evans Lance, MD;  Location: [facility];  Service: Cardiovascular;  Laterality: N/A;  . CARDIAC CATHETERIZATION  "quite a few"  . [place]  . CHOLECYSTECTOMY OPEN  1970's  . CORONARY ANGIOGRAM  2000   No significant CAD  . ELECTROPHYSIOLOGIC STUDY N/A 06/14/2014   Procedure: A-Flutter/A-Tach/SVT Ablation;  Surgeon: Evans Lance, MD;  Location: [facility];  Service: Cardiovascular;  Laterality: N/A;  . INSERTION OF ICD  03/17/2017   BIV  . MYRINGOTOMY WITH TUBE PLACEMENT Right 2015  . PERIPHERAL VASCULAR INTERVENTION  12/17/2016   Procedure: PERIPHERAL VASCULAR INTERVENTION;  Surgeon: Wellington Hampshire, MD;  Location: [facility];  Service: Cardiovascular;;  Right common femoral PTA and Stent  . RIGHT/LEFT HEART CATH AND CORONARY ANGIOGRAPHY Bilateral 11/06/2016   Procedure: RIGHT/LEFT HEART CATH AND CORONARY ANGIOGRAPHY;  Surgeon: Wellington Hampshire, MD;  Location: [facility];  Service: Cardiovascular;  Laterality: Bilateral;  . SVT ABLATION  06/14/2014  . TUBAL LIGATION  1972  . VSD REPAIR  1958; 73   Social History   Socioeconomic History  . Marital status: Married    Spouse  name: Not on file  . Number of children: Not on file  . Years of education: 30  . Highest education level: Not on file  Occupational History  . Occupation: retired  Scientific laboratory technician  . Financial resource strain: Not on file  . Food insecurity    Worry: Not on file    Inability: Not on file  . Transportation needs    Medical: Not on file    Non-medical: Not on file  Tobacco Use  . Smoking status: Current  Every Day Smoker    Packs/day: 0.33    Years: 35.00    Pack years: 11.55    Types: Cigarettes  . Smokeless tobacco: Never Used  . Tobacco comment: Smoked 1971-present , up to 1/2 ppd. Intermittent smoking cessation with preganancy up to 1/2-1 year @ a time  Substance and Sexual Activity  . Alcohol use: Yes    Alcohol/week: 1.0 standard drinks    Types: 1 Glasses of wine per week    Comment: 06/14/2014 "glass of wine maybe once/month"  . Drug use: No  . Sexual activity: Never  Lifestyle  . Physical activity    Days per week: Not on file    Minutes per session: Not on file  . Stress: Not on file  Relationships  . Social Herbalist on phone: Not on file    Gets together: Not on file    Attends religious service: Not on file    Active member of club or organization: Not on file    Attends meetings of clubs or organizations: Not on file    Relationship status: Not on file  Other Topics Concern  . Not on file  Social History Narrative   Pt lives with husband.   Allergies  Allergen Reactions  . Mupirocin Other (See Comments)    Burning, pain, swelling and sob  . Codeine Nausea Only   Family History  Problem Relation Age of Onset  . Cancer Mother   . Cancer Father   . Throat cancer Unknown   . Hypertension Unknown   . Stroke Unknown   . Alcohol abuse Other   . Arthritis Other   . Cancer Other        lung cancer  . Hypertension Other   . Arthritis Other   . Stroke Other   . Breast cancer Neg Hx      Current Outpatient Medications (Cardiovascular):  .  carvedilol (COREG) 3.125 MG tablet, TAKE TWO (2) TABLETS TWICE A DAY .  digoxin (LANOXIN) 0.125 MG tablet, TAKE 1/2 TABLET BY MOUTH DAILY .  rosuvastatin (CRESTOR) 10 MG tablet, Take 1 tablet (10 mg total) by mouth daily. .  sacubitril-valsartan (ENTRESTO) 24-26 MG, Take 1 tablet by mouth 2 (two) times daily. Marland Kitchen  spironolactone (ALDACTONE) 25 MG tablet, Take 1 tablet (25 mg total) by mouth at bedtime. .   furosemide (LASIX) 20 MG tablet, Take 1 tablet (20 mg total) by mouth daily for 2 days, THEN 1 tablet (20 mg total) once a week.  Current Outpatient Medications (Respiratory):  .  albuterol (VENTOLIN HFA) 108 (90 Base) MCG/ACT inhaler, INHALE 2 PUFFS INTO THE LUNGS EVERY 6 (SIX) HOURS AS NEEDED FOR WHEEZING OR SHORTNESS OF BREATH .  chlorpheniramine-HYDROcodone (TUSSIONEX) 10-8 MG/5ML SUER, TAKE 5 ML BY MOUTH EVERY 12 HOURS AS NEEDED FOR COUGH .  tiotropium (SPIRIVA HANDIHALER) 18 MCG inhalation capsule, Place 1 capsule (18 mcg total) into inhaler and inhale daily.  Current Outpatient Medications (Analgesics):  .  aspirin 81 MG tablet, Take 1 tablet (81 mg total) by mouth daily. .  celecoxib (CELEBREX) 100 MG capsule, TAKE 1 CAPSULE BY MOUTH TWO TIMES DAILY AS NEEDED .  colchicine 0.6 MG tablet, Take 1 tablet TID until GI upset or flare subsides, then for maintenance, take 1 tablet daily. Marland Kitchen  ibuprofen (ADVIL,MOTRIN) 200 MG tablet, Take 200 mg by mouth daily as needed for headache.   Current Outpatient Medications (Other):  Marland Kitchen  ALPRAZolam (XANAX) 0.25 MG tablet, 1 tab by mouth twice per day as needed    Past medical history, social, surgical and family history all reviewed in electronic medical record.  No pertanent information unless stated regarding to the chief complaint.   Review of Systems:  No headache, visual changes, nausea, vomiting, diarrhea, constipation, dizziness, abdominal pain, skin rash, fevers, chills, night sweats, weight loss, swollen lymph nodes, body aches, joint swelling, muscle aches, chest pain, shortness of breath, mood changes.   Objective  Blood pressure 90/70, pulse 61, height 5\' 1"  (1.549 m), weight 111 lb (50.3 kg), SpO2 98 %.    General: No apparent distress alert and oriented x3 mood and affect normal, dressed appropriately.  HEENT: Pupils equal, extraocular movements intact  Respiratory: Patient's speak in full sentences and does not appear short of breath   Cardiovascular: No lower extremity edema, non tender, no erythema  Skin: Warm dry intact with no signs of infection or rash on extremities or on axial skeleton.  Abdomen: Soft nontender  Neuro: Cranial nerves II through XII are intact, neurovascularly intact in all extremities with 2+ DTRs and 2+ pulses.  Lymph: No lymphadenopathy of posterior or anterior cervical chain or axillae bilaterally.  Gait normal with good balance and coordination.  MSK: Left shoulder exam shows the patient does have improvement in range of motion.  Still has some mild limited in forward flexion of 5 degrees and external rotation of 5 degrees.  Mild positive impingement but rotator cuff strength 5 out of 5.      Impression and Recommendations:    . The above documentation has been reviewed and is accurate and complete Lyndal Pulley, DO       Note: This dictation was prepared with Dragon dictation along with smaller phrase technology. Any transcriptional errors that result from this process are unintentional.

## 2018-12-28 NOTE — Assessment & Plan Note (Signed)
Patient is doing significantly better.  Rotator cuff strength actually appears to be improved.  Still think, patient will have underlying arthritis and will contribute and give her pain on a regular basis.  Patient though as long as does well with conservative therapy can see me again on a as needed.  Encouraged her to continue the exercises on a fairly regular basis and topical anti-inflammatories as needed

## 2018-12-28 NOTE — Assessment & Plan Note (Signed)

## 2018-12-28 NOTE — Patient Instructions (Signed)

## 2018-12-28 NOTE — Patient Instructions (Signed)
  [address], [address] [place], [place] [address] Phone [phone]

## 2018-12-29 ENCOUNTER — Telehealth: Payer: Self-pay | Admitting: Internal Medicine

## 2018-12-29 ENCOUNTER — Encounter: Payer: Self-pay | Admitting: Internal Medicine

## 2018-12-29 MED ORDER — VITAMIN D (ERGOCALCIFEROL) 1.25 MG (50000 UNIT) PO CAPS: 50000.0000 [IU] | ORAL_CAPSULE | ORAL | 0 refills | Status: DC

## 2018-12-29 NOTE — Telephone Encounter (Signed)
none

## 2018-12-31 ENCOUNTER — Telehealth: Payer: Self-pay

## 2018-12-31 NOTE — Telephone Encounter (Signed)
No need since we only need a shot once per month, not weekly

## 2018-12-31 NOTE — Telephone Encounter (Signed)
Copied from [place] [id number]. Topic: General - Other >> Dec 31, 2018 10:50 AM Reyne Dumas L wrote: Reason for CRM:   Pt states that Dr. Jenny Reichmann told her she would need a B12 shot every week for the next 6 months.  Pt states that she lives in [place] and doesn't want to drive to [place] everyday.  Pt has contacted [employer] and they have told her that with PCP orders they will pay for a nurse to come to her home to administer that injection.  Pt wants to get that set up. Pt can be reached at [phone]

## 2019-01-03 NOTE — Telephone Encounter (Signed)
Called pt, LVM.   

## 2019-01-11 ENCOUNTER — Other Ambulatory Visit (HOSPITAL_COMMUNITY): Payer: Self-pay | Admitting: Cardiology

## 2019-01-12 NOTE — Progress Notes (Signed)
ICD remote 

## 2019-01-23 ENCOUNTER — Encounter: Payer: Self-pay | Admitting: Internal Medicine

## 2019-01-24 ENCOUNTER — Encounter: Payer: Self-pay | Admitting: Internal Medicine

## 2019-01-28 ENCOUNTER — Ambulatory Visit: Payer: Medicare Other

## 2019-02-01 ENCOUNTER — Encounter: Payer: Self-pay | Admitting: Family Medicine

## 2019-02-02 ENCOUNTER — Ambulatory Visit: Payer: Medicare Other

## 2019-02-02 ENCOUNTER — Other Ambulatory Visit (HOSPITAL_COMMUNITY): Payer: Self-pay | Admitting: Cardiology

## 2019-02-03 ENCOUNTER — Ambulatory Visit (INDEPENDENT_AMBULATORY_CARE_PROVIDER_SITE_OTHER): Payer: Medicare Other

## 2019-02-03 ENCOUNTER — Telehealth: Payer: Self-pay

## 2019-02-03 ENCOUNTER — Other Ambulatory Visit: Payer: Self-pay

## 2019-02-03 DIAGNOSIS — E538 Deficiency of other specified B group vitamins: Secondary | ICD-10-CM | POA: Diagnosis not present

## 2019-02-03 MED ORDER — CYANOCOBALAMIN 1000 MCG/ML IJ SOLN
1000.0000 ug | Freq: Once | INTRAMUSCULAR | Status: AC
  Administered 2019-02-03: 1000 ug via INTRAMUSCULAR

## 2019-02-03 NOTE — Telephone Encounter (Signed)
Routing to [place] office---patient needs to start b12 injections monthly for 6 months (then follow up with pcp, Dr. Jenny Reichmann at [place] [facility])---patient started first b12 shot out of 6 today.  She lives much closer to [place] office---patient is requesting to continue b12 injections at [facility] office until her follow up with dr Jenny Reichmann in 6 months----are you ok with patient scheduling b12 injections at [place] office---please advise, I will call patient back, thanks

## 2019-02-03 NOTE — Progress Notes (Signed)
Medical screening examination/treatment/procedure(s) were performed by non-physician practitioner and as supervising physician I was immediately available for consultation/collaboration. I agree with above. James John, MD   

## 2019-02-04 NOTE — Telephone Encounter (Addendum)
Jonelle Sidle and I discussed via phone inability to perform injections here at our office at [facility] due to safety and care tracking concerns.  We are sorry for any inconvenience.    Jonelle Sidle to contact patient and make her aware.   Thanks.

## 2019-02-07 NOTE — Telephone Encounter (Signed)
Pt contacted and is already aware and is schedule for nurse visit at [facility].

## 2019-02-23 ENCOUNTER — Encounter: Payer: Self-pay | Admitting: Internal Medicine

## 2019-02-24 ENCOUNTER — Other Ambulatory Visit: Payer: Self-pay

## 2019-02-24 ENCOUNTER — Encounter: Payer: Self-pay | Admitting: Internal Medicine

## 2019-02-24 ENCOUNTER — Ambulatory Visit: Payer: Medicare Other | Admitting: Internal Medicine

## 2019-02-24 VITALS — BP 114/62 | HR 69 | Ht 61.0 in | Wt 110.0 lb

## 2019-02-24 DIAGNOSIS — I5022 Chronic systolic (congestive) heart failure: Secondary | ICD-10-CM

## 2019-02-24 DIAGNOSIS — Z95810 Presence of automatic (implantable) cardiac defibrillator: Secondary | ICD-10-CM | POA: Diagnosis not present

## 2019-02-24 DIAGNOSIS — J449 Chronic obstructive pulmonary disease, unspecified: Secondary | ICD-10-CM

## 2019-02-24 NOTE — Progress Notes (Signed)
HPI [name] returns today for followup of CHF, HTN, syncope, s/p Biv ICD insertion. She has done well since her last visit. After her ICD insertion, she had a prolonged period of healing. She denies fever or chills. She is still smoking. She has fairly significant COPD and desaturates with exertion. Her CHF symptoms are controlled. She denies chest pain or sob.  Allergies  Allergen Reactions  . Mupirocin Other (See Comments)    Burning, pain, swelling and sob  . Codeine Nausea Only     Current Outpatient Medications  Medication Sig Dispense Refill  . albuterol (VENTOLIN HFA) 108 (90 Base) MCG/ACT inhaler INHALE 2 PUFFS INTO THE LUNGS EVERY 6 (SIX) HOURS AS NEEDED FOR WHEEZING OR SHORTNESS OF BREATH 8.5 g 5  . ALPRAZolam (XANAX) 0.25 MG tablet 1 tab by mouth twice per day as needed 60 tablet 5  . aspirin 81 MG tablet Take 1 tablet (81 mg total) by mouth daily. 100 tablet 99  . carvedilol (COREG) 3.125 MG tablet TAKE TWO (2) TABLETS TWICE A DAY 360 tablet 3  . chlorpheniramine-HYDROcodone (TUSSIONEX) 10-8 MG/5ML SUER TAKE 5 ML BY MOUTH EVERY 12 HOURS AS NEEDED FOR COUGH 115 mL 0  . colchicine 0.6 MG tablet Take 1 tablet TID until GI upset or flare subsides, then for maintenance, take 1 tablet daily. 90 tablet 3  . digoxin (LANOXIN) 0.125 MG tablet TAKE ONE-HALF TABLET BY MOUTH DAILY 45 tablet 0  . ibuprofen (ADVIL,MOTRIN) 200 MG tablet Take 200 mg by mouth daily as needed for headache.    . rosuvastatin (CRESTOR) 10 MG tablet Take 1 tablet (10 mg total) by mouth daily. 90 tablet 1  . sacubitril-valsartan (ENTRESTO) 24-26 MG Take 1 tablet by mouth 2 (two) times daily. 180 tablet 3  . spironolactone (ALDACTONE) 25 MG tablet TAKE ONE TABLET BY MOUTH AT BEDTIME 30 tablet 3  . tiotropium (SPIRIVA HANDIHALER) 18 MCG inhalation capsule Place 1 capsule (18 mcg total) into inhaler and inhale daily. 30 capsule 12  . Vitamin D, Ergocalciferol, (DRISDOL) 1.25 MG (50000 UT) CAPS capsule Take 1  capsule (50,000 Units total) by mouth every 7 (seven) days. 12 capsule 0  . furosemide (LASIX) 20 MG tablet Take 1 tablet (20 mg total) by mouth daily for 2 days, THEN 1 tablet (20 mg total) once a week. 30 tablet 11   No current facility-administered medications for this visit.     Past Medical History:  Diagnosis Date  . AICD (automatic cardioverter/defibrillator) present 03/17/2017  . Anxiety   . Atrial tachycardia ([place])   . Bursitis of shoulder, right, adhesive   . CHF (congestive heart failure) ([place])   . Chronic bronchitis ([place])    "1-2 times/yr" (01/23/2014)  . COPD (chronic obstructive pulmonary disease) ([place])   . CVD (cerebrovascular disease)   . Dyslipidemia   . Dysrhythmia   . Frequency of urination   . GERD (gastroesophageal reflux disease)   . Heart murmur   . History of stomach ulcers   . HTN (hypertension) 02/22/2011  . Migraines    "stopped many years ago" (06/14/2014)  . Osteoporosis 08/19/2016  . Pericarditis   . Pneumonia "10 times" (06/14/2014)  . 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 Silent myocardial infarction ([place]) "late 1990's"  . Stress incontinence    "was suppose to have been tacked up years ago but I didn't do it"  . Syncope, near    Associated with atrial tachycardia-event recorder  1/16  . Thoracic outlet syndrome   . VSD (ventricular septal defect)     ROS:   All systems reviewed and negative except as noted in the HPI.   Past Surgical History:  Procedure Laterality Date  . ABDOMINAL AORTOGRAM W/LOWER EXTREMITY N/A 12/17/2016   Procedure: ABDOMINAL AORTOGRAM W/LOWER EXTREMITY;  Surgeon: Wellington Hampshire, MD;  Location: [facility];  Service: Cardiovascular;  Laterality: N/A;  . BIV ICD INSERTION CRT-D N/A 03/17/2017   Procedure: BIV ICD INSERTION CRT-D;  Surgeon: Evans Lance, MD;  Location: [facility];  Service: Cardiovascular;  Laterality: N/A;  . CARDIAC CATHETERIZATION  "quite a  few"  . [place]  . CHOLECYSTECTOMY OPEN  1970's  . CORONARY ANGIOGRAM  2000   No significant CAD  . ELECTROPHYSIOLOGIC STUDY N/A 06/14/2014   Procedure: A-Flutter/A-Tach/SVT Ablation;  Surgeon: Evans Lance, MD;  Location: [facility];  Service: Cardiovascular;  Laterality: N/A;  . INSERTION OF ICD  03/17/2017   BIV  . MYRINGOTOMY WITH TUBE PLACEMENT Right 2015  . PERIPHERAL VASCULAR INTERVENTION  12/17/2016   Procedure: PERIPHERAL VASCULAR INTERVENTION;  Surgeon: Wellington Hampshire, MD;  Location: [facility];  Service: Cardiovascular;;  Right common femoral PTA and Stent  . RIGHT/LEFT HEART CATH AND CORONARY ANGIOGRAPHY Bilateral 11/06/2016   Procedure: RIGHT/LEFT HEART CATH AND CORONARY ANGIOGRAPHY;  Surgeon: Wellington Hampshire, MD;  Location: [facility];  Service: Cardiovascular;  Laterality: Bilateral;  . SVT ABLATION  06/14/2014  . TUBAL LIGATION  1972  . VSD REPAIR  1958; 59     Family History  Problem Relation Age of Onset  . Cancer Mother   . Cancer Father   . Throat cancer Unknown   . Hypertension Unknown   . Stroke Unknown   . Alcohol abuse Other   . Arthritis Other   . Cancer Other        lung cancer  . Hypertension Other   . Arthritis Other   . Stroke Other   . Breast cancer Neg Hx      Social History   Socioeconomic History  . Marital status: Married    Spouse name: Not on file  . Number of children: Not on file  . Years of education: 40  . Highest education level: Not on file  Occupational History  . Occupation: retired  Tobacco Use  . Smoking status: Current Every Day Smoker    Packs/day: 0.33    Years: 35.00    Pack years: 11.55    Types: Cigarettes  . Smokeless tobacco: Never Used  . Tobacco comment: Smoked 1971-present , up to 1/2 ppd. Intermittent smoking cessation with preganancy up to 1/2-1 year @ a time  Substance and Sexual Activity  . Alcohol use: Yes    Alcohol/week: 1.0 standard drinks     Types: 1 Glasses of wine per week    Comment: 06/14/2014 "glass of wine maybe once/month"  . Drug use: No  . Sexual activity: Never  Other Topics Concern  . Not on file  Social History Narrative   Pt lives with husband.   Social Determinants of Health   Financial Resource Strain:   . Difficulty of Paying Living Expenses: Not on file  Food Insecurity:   . Worried About Charity fundraiser in the Last Year: Not on file  . Ran Out of Food in the Last Year: Not on file  Transportation Needs:   .  Lack of Transportation (Medical): Not on file  . Lack of Transportation (Non-Medical): Not on file  Physical Activity:   . Days of Exercise per Week: Not on file  . Minutes of Exercise per Session: Not on file  Stress:   . Feeling of Stress : Not on file  Social Connections:   . Frequency of Communication with Friends and Family: Not on file  . Frequency of Social Gatherings with Friends and Family: Not on file  . Attends Religious Services: Not on file  . Active Member of Clubs or Organizations: Not on file  . Attends Archivist Meetings: Not on file  . Marital Status: Not on file  Intimate Partner Violence:   . Fear of Current or Ex-Partner: Not on file  . Emotionally Abused: Not on file  . Physically Abused: Not on file  . Sexually Abused: Not on file     BP 114/62   Pulse 69   Ht 5\' 1"  (1.549 m)   Wt 110 lb (49.9 kg)   SpO2 92%   BMI 20.78 kg/m   Physical Exam:  Well appearing NAD HEENT: Unremarkable Neck:  No JVD, no thyromegally Lymphatics:  No adenopathy Back:  No CVA tenderness Lungs:  Clear with no wheezes HEART:  Regular rate rhythm, no murmurs, no rubs, no clicks Abd:  soft, positive bowel sounds, no organomegally, no rebound, no guarding Ext:  2 plus pulses, no edema, no cyanosis, no clubbing Skin:  No rashes no nodules Neuro:  CN II through XII intact, motor grossly intact  EKG - NSR with AV pacing  DEVICE  Normal device function.  See PaceArt  for details.   Assess/Plan: 1. Chronic systolic heart failure - her symptoms are class 2. She will continue her current meds. 2. ICD - her [place] ICD is working normally. We will recheck in several months.  3. Chest pain - she has non-exertional non cardiac pain which is mild. 4. Tobacco abuse - she has stopped smoking and is using nicotine patches. Continue.  Latese Dufault,M.D.

## 2019-02-24 NOTE — Patient Instructions (Signed)

## 2019-02-25 ENCOUNTER — Other Ambulatory Visit: Payer: Self-pay | Admitting: Internal Medicine

## 2019-02-25 ENCOUNTER — Encounter: Payer: Self-pay | Admitting: Internal Medicine

## 2019-02-25 NOTE — Telephone Encounter (Signed)
pls advise if ok to refill../lmb 

## 2019-02-25 NOTE — Telephone Encounter (Signed)
Done erx 

## 2019-03-01 NOTE — Telephone Encounter (Signed)
Key: [id number]

## 2019-03-07 ENCOUNTER — Ambulatory Visit: Payer: Medicare Other

## 2019-03-08 ENCOUNTER — Telehealth: Payer: Self-pay

## 2019-03-08 MED ORDER — HYDROCODONE-HOMATROPINE 5-1.5 MG/5ML PO SYRP: 5.0000 mL | ORAL_SOLUTION | Freq: Four times a day (QID) | ORAL | 0 refills | Status: AC | PRN

## 2019-03-08 NOTE — Telephone Encounter (Signed)
Ok to try hycodan instead

## 2019-03-08 NOTE — Telephone Encounter (Signed)
Medication sent to pharmacy  

## 2019-03-08 NOTE — Telephone Encounter (Signed)
Medication Hydrocodone Polistirex\Chlorpheniramine Polistirex is not covered under her insurance.  Can this medication be changed to something else?  Please advise

## 2019-03-11 ENCOUNTER — Ambulatory Visit: Payer: Medicare Other

## 2019-03-13 ENCOUNTER — Ambulatory Visit: Payer: Medicare Other | Attending: Internal Medicine

## 2019-03-13 DIAGNOSIS — Z23 Encounter for immunization: Secondary | ICD-10-CM | POA: Insufficient documentation

## 2019-03-13 NOTE — Progress Notes (Signed)
   Covid-19 Vaccination Clinic  Name:  Kimberly [name]    MRN: [id number] DOB: Jul 15, 1945  03/13/2019  Kimberly [name] was observed post Covid-19 immunization for 15 minutes without incidence. She was provided with Vaccine Information Sheet and instruction to access the V-Safe system.   Kimberly [name] was instructed to call 911 with any severe reactions post vaccine: Marland Kitchen Difficulty breathing  . Swelling of your face and throat  . A fast heartbeat  . A bad rash all over your body  . Dizziness and weakness    Immunizations Administered    Name Date Dose VIS Date Route   Pfizer COVID-19 Vaccine 03/13/2019  8:47 AM 0.3 mL 12/31/2018 Intramuscular   Manufacturer: [place]   Lot: [id number]   [place]: [id number]

## 2019-03-16 ENCOUNTER — Other Ambulatory Visit: Payer: Self-pay | Admitting: Cardiovascular Disease

## 2019-03-21 ENCOUNTER — Ambulatory Visit (INDEPENDENT_AMBULATORY_CARE_PROVIDER_SITE_OTHER): Payer: Medicare Other | Admitting: *Deleted

## 2019-03-21 ENCOUNTER — Other Ambulatory Visit: Payer: Self-pay

## 2019-03-21 DIAGNOSIS — E538 Deficiency of other specified B group vitamins: Secondary | ICD-10-CM

## 2019-03-21 DIAGNOSIS — I5022 Chronic systolic (congestive) heart failure: Secondary | ICD-10-CM

## 2019-03-21 LAB — CUP PACEART REMOTE DEVICE CHECK
Battery Remaining Longevity: 108 mo
Battery Remaining Percentage: 100 %
Brady Statistic RA Percent Paced: 52 %
Brady Statistic RV Percent Paced: 96 %
Date Time Interrogation Session: 20210301042400
HighPow Impedance: 73 Ohm
Implantable Lead Implant Date: 20190226
Implantable Lead Implant Date: 20190226
Implantable Lead Implant Date: 20190226
Implantable Lead Location: 753858
Implantable Lead Location: 753859
Implantable Lead Location: 753860
Implantable Lead Model: 292
Implantable Lead Model: 4671
Implantable Lead Model: 7740
Implantable Lead Serial Number: 444536
Implantable Lead Serial Number: 726363
Implantable Lead Serial Number: 805938
Implantable Pulse Generator Implant Date: 20190226
Lead Channel Impedance Value: 448 Ohm
Lead Channel Impedance Value: 552 Ohm
Lead Channel Impedance Value: 776 Ohm
Lead Channel Setting Pacing Amplitude: 2 V
Lead Channel Setting Pacing Amplitude: 2.5 V
Lead Channel Setting Pacing Amplitude: 2.6 V
Lead Channel Setting Pacing Pulse Width: 0.4 ms
Lead Channel Setting Pacing Pulse Width: 0.4 ms
Lead Channel Setting Sensing Sensitivity: 0.5 mV
Lead Channel Setting Sensing Sensitivity: 1 mV
Pulse Gen Serial Number: 204176

## 2019-03-21 MED ORDER — CYANOCOBALAMIN 1000 MCG/ML IJ SOLN
1000.0000 ug | Freq: Once | INTRAMUSCULAR | Status: AC
  Administered 2019-03-21: 1000 ug via INTRAMUSCULAR

## 2019-03-21 NOTE — Progress Notes (Signed)
ICD Remote  

## 2019-03-21 NOTE — Progress Notes (Addendum)
Pls cosign for B12 since PCP is out of the office this am../lmb   b12 Injection given.   Binnie Rail, MD

## 2019-04-05 ENCOUNTER — Ambulatory Visit: Payer: Medicare Other | Attending: Internal Medicine

## 2019-04-05 DIAGNOSIS — Z23 Encounter for immunization: Secondary | ICD-10-CM

## 2019-04-05 NOTE — Progress Notes (Signed)
   Covid-19 Vaccination Clinic  Name:  Kimberly [name]    MRN: [id number] DOB: 10/12/1945  04/05/2019  [name] was observed post Covid-19 immunization for 15 minutes without incident. She was provided with Vaccine Information Sheet and instruction to access the V-Safe system.   [name] was instructed to call 911 with any severe reactions post vaccine: Marland Kitchen Difficulty breathing  . Swelling of face and throat  . A fast heartbeat  . A bad rash all over body  . Dizziness and weakness   Immunizations Administered    Name Date Dose VIS Date Route   Pfizer COVID-19 Vaccine 04/05/2019  8:31 AM 0.3 mL 12/31/2018 Intramuscular   Manufacturer: [place]   Lot: [id number]   [place]: [id number]

## 2019-04-12 ENCOUNTER — Other Ambulatory Visit (HOSPITAL_COMMUNITY): Payer: Self-pay | Admitting: Cardiology

## 2019-04-13 ENCOUNTER — Ambulatory Visit: Payer: Medicare Other

## 2019-04-25 ENCOUNTER — Ambulatory Visit (INDEPENDENT_AMBULATORY_CARE_PROVIDER_SITE_OTHER): Payer: Medicare Other

## 2019-04-25 ENCOUNTER — Other Ambulatory Visit: Payer: Self-pay

## 2019-04-25 ENCOUNTER — Telehealth: Payer: Self-pay

## 2019-04-25 DIAGNOSIS — E538 Deficiency of other specified B group vitamins: Secondary | ICD-10-CM

## 2019-04-25 MED ORDER — CYANOCOBALAMIN 1000 MCG/ML IJ SOLN
1000.0000 ug | Freq: Once | INTRAMUSCULAR | Status: AC
  Administered 2019-04-25: 1000 ug via INTRAMUSCULAR

## 2019-04-25 NOTE — Progress Notes (Signed)
Medical screening examination/treatment/procedure(s) were performed by non-physician practitioner and as supervising physician I was immediately available for consultation/collaboration. I agree with above. Villa Burgin, MD   

## 2019-04-25 NOTE — Telephone Encounter (Signed)
Ok to just see her at next appt

## 2019-04-27 NOTE — Telephone Encounter (Signed)
Sent patient my-chart message today to schedule appointment in June for 6 month follow up.

## 2019-04-29 NOTE — Telephone Encounter (Signed)
Last read by [name] at 3:57 PM on 04/27/2019

## 2019-05-05 ENCOUNTER — Other Ambulatory Visit (HOSPITAL_COMMUNITY): Payer: Self-pay | Admitting: Cardiology

## 2019-05-05 ENCOUNTER — Encounter: Payer: Self-pay | Admitting: Internal Medicine

## 2019-05-05 MED ORDER — HYDROCOD POLST-CPM POLST ER 10-8 MG/5ML PO SUER: ORAL | 0 refills | Status: DC

## 2019-05-06 ENCOUNTER — Other Ambulatory Visit: Payer: Self-pay | Admitting: Internal Medicine

## 2019-05-06 NOTE — Telephone Encounter (Signed)
New message:   1.Medication Requested: chlorpheniramine-HYDROcodone ([place]) 10-8 MG/5ML SUER 2. Pharmacy (Name, Street, [place]): [place], [place] 3. On Med List: Yes  4. Last Visit with PCP:   5. Next visit date with PCP:  Pt states what was sent in for her is red and she can't take the red one. She states she needs the [place] looking one sent in like before. Agent: Please be advised that RX refills may take up to 3 business days. We ask that you follow-up with your pharmacy.

## 2019-05-06 NOTE — Telephone Encounter (Signed)
Med refill declined

## 2019-05-06 NOTE — Telephone Encounter (Signed)
Notified the pt informed her MD refilled same script that was previously rx. Not sure if the pharmacy had change manufacturer. She will have to contact pharmacy to see.Marland KitchenJohny [name]

## 2019-05-10 ENCOUNTER — Ambulatory Visit (HOSPITAL_COMMUNITY)
Admission: RE | Admit: 2019-05-10 | Discharge: 2019-05-10 | Disposition: A | Discharge status: Home / Self Care | Payer: Medicare Other | Source: Ambulatory Visit | Attending: Cardiology | Admitting: Cardiology

## 2019-05-10 ENCOUNTER — Encounter (HOSPITAL_COMMUNITY): Payer: Self-pay | Admitting: Cardiology

## 2019-05-10 ENCOUNTER — Other Ambulatory Visit: Payer: Self-pay

## 2019-05-10 VITALS — BP 90/42 | HR 64 | Wt 109.6 lb

## 2019-05-10 DIAGNOSIS — R0789 Other chest pain: Secondary | ICD-10-CM | POA: Diagnosis not present

## 2019-05-10 DIAGNOSIS — I340 Nonrheumatic mitral (valve) insufficiency: Secondary | ICD-10-CM | POA: Insufficient documentation

## 2019-05-10 DIAGNOSIS — Z87891 Personal history of nicotine dependence: Secondary | ICD-10-CM | POA: Diagnosis not present

## 2019-05-10 DIAGNOSIS — I2510 Atherosclerotic heart disease of native coronary artery without angina pectoris: Secondary | ICD-10-CM | POA: Diagnosis not present

## 2019-05-10 DIAGNOSIS — Z7901 Long term (current) use of anticoagulants: Secondary | ICD-10-CM | POA: Diagnosis not present

## 2019-05-10 DIAGNOSIS — R011 Cardiac murmur, unspecified: Secondary | ICD-10-CM | POA: Diagnosis not present

## 2019-05-10 DIAGNOSIS — I480 Paroxysmal atrial fibrillation: Secondary | ICD-10-CM | POA: Diagnosis not present

## 2019-05-10 DIAGNOSIS — Z7982 Long term (current) use of aspirin: Secondary | ICD-10-CM | POA: Insufficient documentation

## 2019-05-10 DIAGNOSIS — I5022 Chronic systolic (congestive) heart failure: Secondary | ICD-10-CM

## 2019-05-10 DIAGNOSIS — J449 Chronic obstructive pulmonary disease, unspecified: Secondary | ICD-10-CM | POA: Diagnosis not present

## 2019-05-10 DIAGNOSIS — Z79899 Other long term (current) drug therapy: Secondary | ICD-10-CM | POA: Diagnosis not present

## 2019-05-10 DIAGNOSIS — I428 Other cardiomyopathies: Secondary | ICD-10-CM | POA: Insufficient documentation

## 2019-05-10 DIAGNOSIS — E785 Hyperlipidemia, unspecified: Secondary | ICD-10-CM | POA: Insufficient documentation

## 2019-05-10 LAB — BASIC METABOLIC PANEL
Anion gap: 8 (ref 5–15)
BUN: 14 mg/dL (ref 8–23)
CO2: 29 mmol/L (ref 22–32)
Calcium: 9.5 mg/dL (ref 8.9–10.3)
Chloride: 103 mmol/L (ref 98–111)
Creatinine, Ser: 0.98 mg/dL (ref 0.44–1.00)
GFR calc Af Amer: >60 mL/min (ref >60)
GFR calc non Af Amer: 58 mL/min — ABNORMAL LOW (ref >60)
Glucose, Bld: 111 mg/dL — ABNORMAL HIGH (ref 70–99)
Potassium: 3.9 mmol/L (ref 3.5–5.1)
Sodium: 140 mmol/L (ref 135–145)

## 2019-05-10 LAB — DIGOXIN LEVEL: Digoxin Level: 0.5 ng/mL — ABNORMAL LOW (ref 0.8–2.0)

## 2019-05-10 MED ORDER — CARVEDILOL 3.125 MG PO TABS: 3.1250 mg | ORAL_TABLET | Freq: Two times a day (BID) | ORAL | 3 refills | Status: DC

## 2019-05-10 NOTE — Patient Instructions (Addendum)
DECREASE Coreg to 3.125mg  (1 tab) twice a day    Please make an appointment with Dr Melvyn Novas.     Labs today We will only contact you if something comes back abnormal or we need to make some changes. Otherwise no news is good news!   Your physician has recommended that you have a cardiopulmonary stress test (CPX). CPX testing is a non-invasive measurement of heart and lung function. It replaces a traditional treadmill stress test. This type of test provides a tremendous amount of information that relates not only to your present condition but also for future outcomes. This test combines measurements of you ventilation, respiratory gas exchange in the lungs, electrocardiogram (EKG), blood pressure and physical response before, during, and following an exercise protocol.    Your physician has requested that you have an echocardiogram. Echocardiography is a painless test that uses sound waves to create images of your heart. It provides your doctor with information about the size and shape of your heart and how well your heart's chambers and valves are working. This procedure takes approximately one hour. There are no restrictions for this procedure.   Your physician recommends that you schedule a follow-up appointment in: 3 months with Dr Aundra Dubin    Please call office at [phone] option [phone] if you have any questions or concerns.    At the [facility], you and your health needs are our priority. As part of our continuing mission to provide you with exceptional heart care, we have created designated Provider Care Teams. These Care Teams include your primary Cardiologist (physician) and Advanced Practice Providers (APPs- Physician Assistants and Nurse Practitioners) who all work together to provide you with the care you need, when you need it.   You may see any of the following providers on your designated Care Team at your next follow up: Marland Kitchen Dr Glori Bickers . Dr Loralie Champagne . Darrick Grinder, NP . Lyda Jester, PA . Audry Riles, PharmD   Please be sure to bring in all your medications bottles to every appointment.

## 2019-05-10 NOTE — Progress Notes (Signed)
PCP: Dr. Jenny Reichmann Cardiology: Dr. Harrington Challenger HF Cardiology: Dr. Aundra Dubin  74 y.o.with history of VSD repair as a child, COPD/active smoking, PAD, and chronic systolic CHF was referred by Dr. Harrington Challenger for evaluation of CHF.   Patient has a long history of cardiomyopathy.  She had VSD repaired as a child and imaging has not showed residual VSD.  Cardiac MRI in 11/09 showed EF 36%.  Echo in 12/15 showed EF 40-45%.  Echo in 3/18 showed EF down to 15-20% with moderate RV systolic dysfunction.  RHC/LHC in 10/18 showed nonobstructive coronary but was concerning for low output (CI 1.33).  She had a right lower leg slowly healing ulcer.  She had peripheral arteriogram with bilateral occluded CFAs. She had PCI to right CFA.    CPX in 1/19 showed moderate to severe functional limitation, combination of HF and lung disease, probably more related to the lung disease.  PFTs in 12/18 showed severe COPD.   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    7/19 peripheral arterial dopplers showed significant in-stent restenosis right SFA stent.  She returns for followup of CHF.  She is no longer smoking.  Weight is down 2 lbs.  She continues to have generalized fatigue.  She has occasional tightness in her chest.  This is not common and tends to be related to emotional stress rather than exertion. She is generally ok walking on flat ground but gets short of breath with inclines and stairs.  She is short of breath with moderate housework like vacuuming. No claudication, no pedal ulcerations.   [name] device interrogation: Heartlogic score 7.  No AT/AF.  No VT.    Labs (7/18): LDL 61 Labs (11/18): K 4.2, creatinine 0.7, hgb 13.7 Labs (1/19): K 4.3, creatinine 1.07 Labs (2/19): K 4.5, creatinine 1.14 Labs (7/19): K 4.4, creatinine 1.04, LDL 61, digoxin 1.2 Labs (9/19): digoxin 0.7, K 4.8, creatinine 0.93 Labs (1/20): K 4.3, creatinine 1.04 Labs (12/20): LDL 63, HDL 87, K 4.7, creatinine 1.01  PMH: 1. VSD: s/p  repair at [facility] as child.  From description, sounds like muscular VSD.  2. Atrial tachycardia s/p ablation.  3. Hyperlipidemia 4. COPD: Active smoker.  - PFTs (12/18): Severe obstructive lung disease.  5. Atrial fibrillation: Paroxysmal.   Noted only transiently.   6. PAD: ABIs 11/18 with 0.62 on right, 0.72 on left.  - Angiogram 11/18 showed totally occluded bilateral CFAs.  Patient had stent to right CFA.  ABIs in 12/18 were normal on right. - Peripheral arterial dopplers (7/19) with significant in-stent restenosis right CFA.   - ABIs (10/20): ABI 0.8 right, 0.74 left 7. Chronic systolic CHF: Nonischemic cardiomyopathy.   - cMRI (11/09): EF 36%, VSD patch in anterior ventricular septum - Echo (12/15): EF 40-45% - Echo (3/18): EF 15-20%, moderate LV dilation, moderate MR, moderate RV dilation with moderately decreased systolic function.  - LHC/RHC (10/18): 3+ MR, EF < 25%, minimal nonobstructive CAD; PA 41/21, LVEDP 18, CI 1.33.  - CPX (1/19): peak VO2 13.4, VE/VCO2 slope 43, RER 1.07.  Mod-severe functional limitation due to combination of HF and lung disease, probably more related to lung disease.  - [name] CRT-D device.  - Echo (7/19): EF 25-30%, mild LV dilation, mild MR, normal RV size and systolic function.  8. PVCs: Zio patch 9/19 with 6% PVCs.   SH: Prior smoker, lives in [place], married.  FH: Mother with PAD, sister died at birth from congenital heart disease.   ROS: All systems  reviewed and negative except as per HPI.   Current Outpatient Medications  Medication Sig Dispense Refill  . albuterol (VENTOLIN HFA) 108 (90 Base) MCG/ACT inhaler INHALE 2 PUFFS INTO THE LUNGS EVERY 6 (SIX) HOURS AS NEEDED FOR WHEEZING OR SHORTNESS OF BREATH 8.5 g 5  . ALPRAZolam (XANAX) 0.25 MG tablet 1 tab by mouth twice per day as needed 60 tablet 5  . aspirin 81 MG tablet Take 1 tablet (81 mg total) by mouth daily. 100 tablet 99  . carvedilol (COREG) 3.125 MG tablet Take 1 tablet  (3.125 mg total) by mouth 2 (two) times daily with a meal. 180 tablet 3  . chlorpheniramine-HYDROcodone (TUSSIONEX) 10-8 MG/5ML SUER TAKE 1 TEASPOONFUL (5ML) BY MOUTH EVERY 12 HOURS AS NEEDED FOR COUGH 115 mL 0  . colchicine 0.6 MG tablet Take 1 tablet TID until GI upset or flare subsides, then for maintenance, take 1 tablet daily. 90 tablet 3  . Cyanocobalamin (B-12 COMPLIANCE INJECTION IJ) Inject as directed. Injection every 30 days    . diclofenac Sodium (VOLTAREN) 1 % GEL Voltaren 1 % topical gel  Apply 4 g every 12 hours by topical route as needed.    . digoxin (LANOXIN) 0.125 MG tablet TAKE 1/2 TABLET BY MOUTH DAILY 45 tablet 0  . ENTRESTO 24-26 MG TAKE 1 TABLET BY MOUTH TWICE A DAY 180 tablet 3  . furosemide (LASIX) 20 MG tablet Take 1 tablet (20 mg total) by mouth daily for 2 days, THEN 1 tablet (20 mg total) once a week. 30 tablet 11  . ibuprofen (ADVIL,MOTRIN) 200 MG tablet Take 200 mg by mouth daily as needed for headache.    . rosuvastatin (CRESTOR) 10 MG tablet TAKE 1 TABLET BY MOUTH DAILY 90 tablet 3  . spironolactone (ALDACTONE) 25 MG tablet TAKE ONE TABLET BY MOUTH AT BEDTIME 30 tablet 3   No current facility-administered medications for this encounter.   BP (!) 90/42   Pulse 64   Wt 49.7 kg (109 lb 9.6 oz)   SpO2 100%   BMI 20.71 kg/m  General: NAD Neck: No JVD, no thyromegaly or thyroid nodule.  Lungs: Rhonchi bilaterally CV: Nondisplaced PMI.  Heart regular S1/S2, no S3/S4, 2/6 HSM apex.  No peripheral edema.  No carotid bruit.  Unable to palpate pedal pulses.  Abdomen: Soft, nontender, no hepatosplenomegaly, no distention.  Skin: Intact without lesions or rashes.  Neurologic: Alert and oriented x 3.  Psych: Normal affect. Extremities: No clubbing or cyanosis.  HEENT: Normal.   Assessment/Plan: 1.  PAD: She had PCI to occluded right CFA in 11/18, but 7/19 peripheral arterial dopplers showed significant in-stent restenosis in the right CFA. She has occluded left CFA  also that was not intervened on. She denies claudication or pedal ulcerations. She has quit smoking.  - Medical management in absence significant symptoms.  - Continue statin.  - Follows with Dr. Fletcher Anon.  2. CAD: Nonobstructive on 10/18 cath. Rare atypical chest pain.  - She is on ASA 81 and statin.  3. Hyperlipidemia: Good lipids in 12/20.  Continue Crestor.   4. COPD: Severe by 12/18 PFTs. COPD plays a significant role in her dyspnea, rhonchi on lung exam today. She has quit smoking.  - She should followup with her pulmonologist.  6. Chronic systolic CHF: Nonischemic cardiomyopathy.  RHC/LHC in 10/18 showed no nonobstructive coronary disease but CI was very low at 1.33.  Her low cardiac output was out of proportion to her symptoms.  [name] CRT-D device.  CPX suggested moderate-severe functional limitation, but probably more due to COPD than CHF.  Most recent echo in 7/19 with EF 25-30%.  On exam, she is not volume overloaded and Heartlogic score is 7. NYHA class III symptoms, likely due mostly to COPD.  She has not tolerated further medication titration due to orthostatic symptoms. Significant fatigue.  - Continue Entresto 24/26 bid. BMET today.  - She does not need a diuretic.  - With significant fatigue, I will cut back Coreg to 3.125 mg daily to see if this helps any.   - Continue spironolactone 25 mg daily, take in evening as this helps to limit orthostatic symptoms.  - Continue digoxin, check level today.  - Repeat echo to reassess LV function and MR.  - CPX to assess functional capacity and help show the relative contribution to her symptoms from COPD and CHF.  - LVAD would be a difficult proposition for her given severity of lung disease.   7. Mitral regurgitation: Only mild on most recent echo though her murmur is prominent.   Followup in 3 months.   Loralie Champagne 05/10/2019

## 2019-05-18 ENCOUNTER — Other Ambulatory Visit: Payer: Self-pay

## 2019-05-18 ENCOUNTER — Other Ambulatory Visit: Payer: Self-pay | Admitting: Internal Medicine

## 2019-05-18 ENCOUNTER — Ambulatory Visit (HOSPITAL_COMMUNITY)
Admission: RE | Admit: 2019-05-18 | Discharge: 2019-05-18 | Disposition: A | Discharge status: Home / Self Care | Payer: Medicare Other | Source: Ambulatory Visit | Attending: Internal Medicine | Admitting: Internal Medicine

## 2019-05-18 DIAGNOSIS — I5022 Chronic systolic (congestive) heart failure: Secondary | ICD-10-CM

## 2019-05-18 DIAGNOSIS — I509 Heart failure, unspecified: Secondary | ICD-10-CM | POA: Diagnosis not present

## 2019-05-18 DIAGNOSIS — Z95810 Presence of automatic (implantable) cardiac defibrillator: Secondary | ICD-10-CM | POA: Insufficient documentation

## 2019-05-18 DIAGNOSIS — I517 Cardiomegaly: Secondary | ICD-10-CM | POA: Diagnosis not present

## 2019-05-18 NOTE — Progress Notes (Signed)
  Echocardiogram 2D Echocardiogram has been performed.  Kimberly [name] 05/18/2019, 9:29 AM

## 2019-05-23 ENCOUNTER — Other Ambulatory Visit
Admission: RE | Admit: 2019-05-23 | Discharge: 2019-05-23 | Disposition: A | Discharge status: Home / Self Care | Payer: Medicare Other | Source: Ambulatory Visit | Attending: Cardiology | Admitting: Cardiology

## 2019-05-23 DIAGNOSIS — Z01812 Encounter for preprocedural laboratory examination: Secondary | ICD-10-CM | POA: Diagnosis not present

## 2019-05-23 DIAGNOSIS — Z20822 Contact with and (suspected) exposure to covid-19: Secondary | ICD-10-CM | POA: Insufficient documentation

## 2019-05-23 LAB — SARS CORONAVIRUS 2 (TAT 6-24 HRS): SARS Coronavirus 2: NEGATIVE

## 2019-05-26 ENCOUNTER — Other Ambulatory Visit (HOSPITAL_COMMUNITY): Payer: Self-pay | Admitting: *Deleted

## 2019-05-26 ENCOUNTER — Other Ambulatory Visit: Payer: Self-pay

## 2019-05-26 ENCOUNTER — Ambulatory Visit (HOSPITAL_COMMUNITY): Payer: Medicare Other | Attending: Internal Medicine

## 2019-05-26 ENCOUNTER — Encounter (HOSPITAL_COMMUNITY): Payer: Self-pay | Admitting: *Deleted

## 2019-05-26 DIAGNOSIS — I5022 Chronic systolic (congestive) heart failure: Secondary | ICD-10-CM

## 2019-05-30 ENCOUNTER — Other Ambulatory Visit: Payer: Self-pay

## 2019-05-30 ENCOUNTER — Encounter: Payer: Self-pay | Admitting: Internal Medicine

## 2019-05-30 ENCOUNTER — Ambulatory Visit (INDEPENDENT_AMBULATORY_CARE_PROVIDER_SITE_OTHER): Payer: Medicare Other | Admitting: Internal Medicine

## 2019-05-30 ENCOUNTER — Ambulatory Visit (INDEPENDENT_AMBULATORY_CARE_PROVIDER_SITE_OTHER): Payer: Medicare Other

## 2019-05-30 VITALS — BP 110/58 | HR 60 | Temp 98.2°F | Ht 61.0 in | Wt 108.4 lb

## 2019-05-30 DIAGNOSIS — J449 Chronic obstructive pulmonary disease, unspecified: Secondary | ICD-10-CM

## 2019-05-30 DIAGNOSIS — E538 Deficiency of other specified B group vitamins: Secondary | ICD-10-CM

## 2019-05-30 DIAGNOSIS — R079 Chest pain, unspecified: Secondary | ICD-10-CM

## 2019-05-30 DIAGNOSIS — E559 Vitamin D deficiency, unspecified: Secondary | ICD-10-CM

## 2019-05-30 DIAGNOSIS — R739 Hyperglycemia, unspecified: Secondary | ICD-10-CM | POA: Diagnosis not present

## 2019-05-30 DIAGNOSIS — Z0001 Encounter for general adult medical examination with abnormal findings: Secondary | ICD-10-CM | POA: Diagnosis not present

## 2019-05-30 DIAGNOSIS — F17200 Nicotine dependence, unspecified, uncomplicated: Secondary | ICD-10-CM

## 2019-05-30 MED ORDER — CYANOCOBALAMIN 1000 MCG/ML IJ SOLN
1000.0000 ug | Freq: Once | INTRAMUSCULAR | Status: AC
  Administered 2019-05-30: 1000 ug via INTRAMUSCULAR

## 2019-05-30 NOTE — Patient Instructions (Addendum)
Please ask about the LDCT lung cancer screening program at your next visit with Dr Melvyn Novas - I will refer  You had the B12 shot today  OK to change the B12 shots to the pills - I will send the prescription, but this is also OTC as well  Please continue all other medications as before, and refills have been done if requested.  Please have the pharmacy call with any other refills you may need.  Please continue your efforts at being more active, low cholesterol diet, and weight control.  You are otherwise up to date with prevention measures today.  Please keep your appointments with your specialists as you may have planned  You will be contacted regarding the referral for: Dr Melvyn Novas  Please go to the XRAY Department in the first floor for the x-ray testing  Please go to the LAB at the blood drawing area for the tests to be done  You will be contacted by phone if any changes need to be made immediately.  Otherwise, you will receive a letter about your results with an explanation, but please check with MyChart first.  Please remember to sign up for MyChart if you have not done so, as this will be important to you in the future with finding out test results, communicating by private email, and scheduling acute appointments online when needed.  Please make an Appointment to return in 6 months, or sooner if needed

## 2019-05-30 NOTE — Assessment & Plan Note (Signed)
For LDCT monitoring lung ca prevention, urged to quit

## 2019-05-30 NOTE — Assessment & Plan Note (Signed)

## 2019-05-30 NOTE — Assessment & Plan Note (Signed)
stable overall by history and exam, recent data reviewed with pt, and pt to continue medical treatment as before,  to f/u any worsening symptoms or concerns  

## 2019-05-30 NOTE — Progress Notes (Signed)
Subjective:    Patient ID: Kimberly [name], female    DOB: 03-Sep-1945, 74 y.o.   MRN: [id number]  HPI  Here for wellness and f/u;  Overall doing ok;  Pt denies Chest pain, worsening SOB, DOE, wheezing, orthopnea, PND, worsening LE edema, palpitations, dizziness or syncope.  Pt denies neurological change such as new headache, facial or extremity weakness.  Pt denies polydipsia, polyuria, or low sugar symptoms. Pt states overall good compliance with treatment and medications, good tolerability, and has been trying to follow appropriate diet.  Pt denies worsening depressive symptoms, suicidal ideation or panic. No fever, night sweats, wt loss, loss of appetite, or other constitutional symptoms.  Pt states good ability with ADL's, has low fall risk, home safety reviewed and adequate, no other significant changes in hearing or vision, and only occasionally active with exercise. Due for b12 shot. Also Pt continues to have recurring right and left periscapular painwithout change in severity, bowel or bladder change, fever, wt loss,  worsening LE pain/numbness/weakness, gait change or falls. Has been getting IM shots for b12 Past Medical History:  Diagnosis Date  . AICD (automatic cardioverter/defibrillator) present 03/17/2017  . Anxiety   . Atrial tachycardia ([place])   . Bursitis of shoulder, right, adhesive   . CHF (congestive heart failure) ([place])   . Chronic bronchitis ([place])    "1-2 times/yr" (01/23/2014)  . COPD (chronic obstructive pulmonary disease) ([place])   . CVD (cerebrovascular disease)   . Dyslipidemia   . Dysrhythmia   . Frequency of urination   . GERD (gastroesophageal reflux disease)   . Heart murmur   . History of stomach ulcers   . HTN (hypertension) 02/22/2011  . Migraines    "stopped many years ago" (06/14/2014)  . Osteoporosis 08/19/2016  . Pericarditis   . Pneumonia "10 times" (06/14/2014)  . 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 Silent myocardial  infarction ([place]) "late 1990's"  . Stress incontinence    "was suppose to have been tacked up years ago but I didn't do it"  . Syncope, near    Associated with atrial tachycardia-event recorder 1/16  . Thoracic outlet syndrome   . VSD (ventricular septal defect)    Past Surgical History:  Procedure Laterality Date  . ABDOMINAL AORTOGRAM W/LOWER EXTREMITY N/A 12/17/2016   Procedure: ABDOMINAL AORTOGRAM W/LOWER EXTREMITY;  Surgeon: Wellington Hampshire, MD;  Location: [facility];  Service: Cardiovascular;  Laterality: N/A;  . BIV ICD INSERTION CRT-D N/A 03/17/2017   Procedure: BIV ICD INSERTION CRT-D;  Surgeon: Evans Lance, MD;  Location: [facility];  Service: Cardiovascular;  Laterality: N/A;  . CARDIAC CATHETERIZATION  "quite a few"  . [place]  . CHOLECYSTECTOMY OPEN  1970's  . CORONARY ANGIOGRAM  2000   No significant CAD  . ELECTROPHYSIOLOGIC STUDY N/A 06/14/2014   Procedure: A-Flutter/A-Tach/SVT Ablation;  Surgeon: Evans Lance, MD;  Location: [facility];  Service: Cardiovascular;  Laterality: N/A;  . INSERTION OF ICD  03/17/2017   BIV  . MYRINGOTOMY WITH TUBE PLACEMENT Right 2015  . PERIPHERAL VASCULAR INTERVENTION  12/17/2016   Procedure: PERIPHERAL VASCULAR INTERVENTION;  Surgeon: Wellington Hampshire, MD;  Location: [facility];  Service: Cardiovascular;;  Right common femoral PTA and Stent  . RIGHT/LEFT HEART CATH AND CORONARY ANGIOGRAPHY Bilateral 11/06/2016   Procedure: RIGHT/LEFT HEART CATH AND CORONARY ANGIOGRAPHY;  Surgeon: Wellington Hampshire, MD;  Location: [facility];  Service: Cardiovascular;  Laterality: Bilateral;  . SVT ABLATION  06/14/2014  . TUBAL LIGATION  1972  . VSD REPAIR  1958; 1967    reports that she has been smoking cigarettes. She has a 11.55 pack-year smoking history. She has never used smokeless tobacco. She reports current alcohol use of about 1.0 standard drinks of alcohol per week. She reports that  she does not use drugs. family history includes Alcohol abuse in an other family member; Arthritis in some other family members; Cancer in her father, mother, and another family member; Hypertension in some other family members; Stroke in some other family members; Throat cancer in an other family member. Allergies  Allergen Reactions  . Mupirocin Other (See Comments)    Burning, pain, swelling and sob  . Codeine Nausea Only   Current Outpatient Medications on File Prior to Visit  Medication Sig Dispense Refill  . albuterol (VENTOLIN HFA) 108 (90 Base) MCG/ACT inhaler INHALE 2 PUFFS INTO THE LUNGS EVERY 6 (SIX) HOURS AS NEEDED FOR WHEEZING OR SHORTNESS OF BREATH 8.5 g 5  . ALPRAZolam (XANAX) 0.25 MG tablet 1 tab by mouth twice per day as needed 60 tablet 5  . aspirin 81 MG tablet Take 1 tablet (81 mg total) by mouth daily. 100 tablet 99  . carvedilol (COREG) 3.125 MG tablet Take 1 tablet (3.125 mg total) by mouth 2 (two) times daily with a meal. 180 tablet 3  . chlorpheniramine-HYDROcodone (TUSSIONEX) 10-8 MG/5ML SUER TAKE 1 TEASPOONFUL (5ML) BY MOUTH EVERY 12 HOURS AS NEEDED FOR COUGH 115 mL 0  . Cyanocobalamin (B-12 COMPLIANCE INJECTION IJ) Inject as directed. Injection every 30 days    . digoxin (LANOXIN) 0.125 MG tablet TAKE 1/2 TABLET BY MOUTH DAILY 45 tablet 0  . ENTRESTO 24-26 MG TAKE 1 TABLET BY MOUTH TWICE A DAY 180 tablet 3  . furosemide (LASIX) 20 MG tablet TAKE 1 TABLET BY MOUTH DAILY FOR 2 DAYS,THEN 1 TABLET ONCE A WEEK. 30 tablet 5  . ibuprofen (ADVIL,MOTRIN) 200 MG tablet Take 200 mg by mouth daily as needed for headache.    . rosuvastatin (CRESTOR) 10 MG tablet TAKE 1 TABLET BY MOUTH DAILY 90 tablet 3  . spironolactone (ALDACTONE) 25 MG tablet TAKE ONE TABLET BY MOUTH AT BEDTIME 30 tablet 3   No current facility-administered medications on file prior to visit.   Review of Systems All otherwise neg per pt     Objective:   Physical Exam BP (!) 110/58 (BP Location: Left  Arm, Patient Position: Sitting, Cuff Size: Small)   Pulse 60   Temp 98.2 F (36.8 C) (Oral)   Ht 5\' 1"  (1.549 m)   Wt 108 lb 6 oz (49.2 kg)   SpO2 98%   BMI 20.48 kg/m  VS noted,  Constitutional: Pt appears in NAD HENT: Head: NCAT.  Right Ear: External ear normal.  Left Ear: External ear normal.  Eyes: . Pupils are equal, round, and reactive to light. Conjunctivae and EOM are normal Nose: without d/c or deformity Neck: Neck supple. Gross normal ROM Cardiovascular: Normal rate and regular rhythm.   Pulmonary/Chest: Effort normal and breath sounds without rales or wheezing.  Abd:  Soft, NT, ND, + BS, no organomegaly Neurological: Pt is alert. At baseline orientation, motor grossly intact Skin: Skin is warm. No rashes, other new lesions, no LE edema Psychiatric: Pt behavior is normal without agitation  All otherwise neg per pt Lab Results  Component Value Date   WBC 6.8 12/28/2018  HGB 13.9 12/28/2018   HCT 41.3 12/28/2018   PLT 258.0 12/28/2018   GLUCOSE 111 (H) 05/10/2019   CHOL 162 12/28/2018   TRIG 54.0 12/28/2018   HDL 87.30 12/28/2018   LDLDIRECT 112.6 03/16/2013   LDLCALC 63 12/28/2018   ALT 13 12/28/2018   AST 16 12/28/2018   NA 140 05/10/2019   K 3.9 05/10/2019   CL 103 05/10/2019   CREATININE 0.98 05/10/2019   BUN 14 05/10/2019   CO2 29 05/10/2019   TSH 0.97 12/28/2018   INR 1.02 12/15/2016   HGBA1C 6.2 12/28/2018      Assessment & Plan:

## 2019-05-30 NOTE — Assessment & Plan Note (Signed)
For f/u pulmonary, stable

## 2019-05-30 NOTE — Assessment & Plan Note (Addendum)
Also for cxr \\I  spent 31 minutes in addition to time for CPX wellness examination in preparing to see the patient by review of recent labs, imaging and procedures, obtaining and reviewing separately obtained history, communicating with the patient and family or caregiver, ordering medications, tests or procedures, and documenting clinical information in the EHR including the differential Dx, treatment, and any further evaluation and other management of chest pain/back pain, copd, hyperglycemia, b12 deficiency

## 2019-05-31 ENCOUNTER — Other Ambulatory Visit: Payer: Medicare Other

## 2019-05-31 LAB — URINALYSIS, ROUTINE W REFLEX MICROSCOPIC
Bilirubin Urine: NEGATIVE
Ketones, ur: NEGATIVE
Leukocytes,Ua: NEGATIVE
Nitrite: NEGATIVE
Specific Gravity, Urine: 1.025 (ref 1.000–1.030)
Total Protein, Urine: NEGATIVE
Urine Glucose: NEGATIVE
Urobilinogen, UA: 0.2 (ref 0.0–1.0)
pH: 5 (ref 5.0–8.0)

## 2019-05-31 LAB — BASIC METABOLIC PANEL
BUN: 20 mg/dL (ref 6–23)
CO2: 31 mEq/L (ref 19–32)
Calcium: 9.3 mg/dL (ref 8.4–10.5)
Chloride: 99 mEq/L (ref 96–112)
Creatinine, Ser: 0.91 mg/dL (ref 0.40–1.20)
GFR: 60.72 mL/min (ref >60.00)
Glucose, Bld: 128 mg/dL — ABNORMAL HIGH (ref 70–99)
Potassium: 4.9 mEq/L (ref 3.5–5.1)
Sodium: 136 mEq/L (ref 135–145)

## 2019-05-31 LAB — CBC WITH DIFFERENTIAL/PLATELET
Basophils Absolute: 0.1 10*3/uL (ref 0.0–0.1)
Basophils Relative: 1.4 % (ref 0.0–3.0)
Eosinophils Absolute: 0.2 10*3/uL (ref 0.0–0.7)
Eosinophils Relative: 3.2 % (ref 0.0–5.0)
HCT: 38.6 % (ref 36.0–46.0)
Hemoglobin: 13 g/dL (ref 12.0–15.0)
Lymphocytes Relative: 34.2 % (ref 12.0–46.0)
Lymphs Abs: 2.2 10*3/uL (ref 0.7–4.0)
MCHC: 33.6 g/dL (ref 30.0–36.0)
MCV: 91.6 fl (ref 78.0–100.0)
Monocytes Absolute: 0.5 10*3/uL (ref 0.1–1.0)
Monocytes Relative: 7.6 % (ref 3.0–12.0)
Neutro Abs: 3.5 10*3/uL (ref 1.4–7.7)
Neutrophils Relative %: 53.6 % (ref 43.0–77.0)
Platelets: 229 10*3/uL (ref 150.0–400.0)
RBC: 4.22 Mil/uL (ref 3.87–5.11)
RDW: 13.6 % (ref 11.5–15.5)
WBC: 6.5 10*3/uL (ref 4.0–10.5)

## 2019-05-31 LAB — HEPATIC FUNCTION PANEL
ALT: 11 U/L (ref 0–35)
AST: 17 U/L (ref 0–37)
Albumin: 4.2 g/dL (ref 3.5–5.2)
Alkaline Phosphatase: 62 U/L (ref 39–117)
Bilirubin, Direct: 0.1 mg/dL (ref 0.0–0.3)
Total Bilirubin: 0.8 mg/dL (ref 0.2–1.2)
Total Protein: 6.9 g/dL (ref 6.0–8.3)

## 2019-05-31 LAB — LIPID PANEL
Cholesterol: 155 mg/dL (ref 0–200)
HDL: 79.8 mg/dL (ref >39.00)
LDL Cholesterol: 65 mg/dL (ref 0–99)
NonHDL: 74.98
Total CHOL/HDL Ratio: 2
Triglycerides: 52 mg/dL (ref 0.0–149.0)
VLDL: 10.4 mg/dL (ref 0.0–40.0)

## 2019-05-31 LAB — TSH: TSH: 0.99 u[IU]/mL (ref 0.35–4.50)

## 2019-05-31 LAB — VITAMIN D 25 HYDROXY (VIT D DEFICIENCY, FRACTURES): VITD: 31.48 ng/mL (ref 30.00–100.00)

## 2019-05-31 LAB — HEMOGLOBIN A1C: Hgb A1c MFr Bld: 6.1 % (ref 4.6–6.5)

## 2019-05-31 LAB — VITAMIN B12: Vitamin B-12: >1500 pg/mL — ABNORMAL HIGH (ref 211–911)

## 2019-06-02 ENCOUNTER — Other Ambulatory Visit (HOSPITAL_COMMUNITY): Payer: Self-pay | Admitting: Cardiology

## 2019-06-16 ENCOUNTER — Other Ambulatory Visit: Payer: Self-pay

## 2019-06-16 ENCOUNTER — Ambulatory Visit: Payer: Medicare Other | Admitting: Cardiovascular Disease

## 2019-06-16 ENCOUNTER — Encounter: Payer: Self-pay | Admitting: Cardiovascular Disease

## 2019-06-16 VITALS — BP 92/60 | HR 62 | Ht 61.0 in | Wt 112.0 lb

## 2019-06-16 DIAGNOSIS — I420 Dilated cardiomyopathy: Secondary | ICD-10-CM

## 2019-06-16 DIAGNOSIS — I739 Peripheral vascular disease, unspecified: Secondary | ICD-10-CM

## 2019-06-16 DIAGNOSIS — I5022 Chronic systolic (congestive) heart failure: Secondary | ICD-10-CM

## 2019-06-16 DIAGNOSIS — E785 Hyperlipidemia, unspecified: Secondary | ICD-10-CM

## 2019-06-16 NOTE — Progress Notes (Signed)
Cardiology Office Note   Date:  06/16/2019   ID:  Kimberly [name], Kimberly [name] 04/04/1945, MRN [id number]  PCP:  Biagio Borg, MD  Cardiologist: Dr. Harrington Challenger  Chief Complaint  Patient presents with  . office visit    pt states gets short winded. Meds verbally reviewed w/ pt.       History of Present Illness: Kimberly [name] is a 74 y.o. female who is here today for a follow-up visit regarding peripheral arterial disease.   She is followed by me for peripheral arterial disease and followed closely at the heart failure clinic.  She has history of congenital heart disease status post VSD repair with multiple prior catheterizations via bilateral femoral arteries when she was a child, SVT status post ablation, hyperlipidemia, COPD, tobacco use, transient atrial fibrillation and chronic systolic heart failure. Patient was seen in 2018 for peripheral arterial disease with nonhealing wound on the right lateral leg above the ankle. She underwent recent noninvasive vascular evaluation which showed a right ABI of 0.62 and left of 0.72. Angiography in 2018 showed moderate left common iliac artery stenosis, short occlusion of right common femoral artery with no significant infrainguinal disease and short occlusion of the left common femoral artery with no significant infrainguinal disease.  I performed successful drug-coated balloon angioplasty and self-expanding stent placement to the right common femoral artery without complications.  She had no recurrent wounds since then. She developed severe restenosis in the right common femoral artery but remained asymptomatic and thus has been monitored closely.  She quit smoking since last visit.  She denies any lower extremity claudication and no lower extremity ulceration.  She recently had a cardiopulmonary stress test and was limited by shortness of breath and fatigue but not by claudication.   Past Medical History:  Diagnosis Date  . AICD (automatic  cardioverter/defibrillator) present 03/17/2017  . Anxiety   . Atrial tachycardia ([place])   . Bursitis of shoulder, right, adhesive   . CHF (congestive heart failure) ([place])   . Chronic bronchitis ([place])    "1-2 times/yr" (01/23/2014)  . COPD (chronic obstructive pulmonary disease) ([place])   . CVD (cerebrovascular disease)   . Dyslipidemia   . Dysrhythmia   . Frequency of urination   . GERD (gastroesophageal reflux disease)   . Heart murmur   . History of stomach ulcers   . HTN (hypertension) 02/22/2011  . Migraines    "stopped many years ago" (06/14/2014)  . Osteoporosis 08/19/2016  . Pericarditis   . Pneumonia "10 times" (06/14/2014)  . 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 Silent myocardial infarction ([place]) "late 1990's"  . Stress incontinence    "was suppose to have been tacked up years ago but I didn't do it"  . Syncope, near    Associated with atrial tachycardia-event recorder 1/16  . Thoracic outlet syndrome   . VSD (ventricular septal defect)     Past Surgical History:  Procedure Laterality Date  . ABDOMINAL AORTOGRAM W/LOWER EXTREMITY N/A 12/17/2016   Procedure: ABDOMINAL AORTOGRAM W/LOWER EXTREMITY;  Surgeon: Wellington Hampshire, MD;  Location: [facility];  Service: Cardiovascular;  Laterality: N/A;  . BIV ICD INSERTION CRT-D N/A 03/17/2017   Procedure: BIV ICD INSERTION CRT-D;  Surgeon: Evans Lance, MD;  Location: [facility];  Service: Cardiovascular;  Laterality: N/A;  . CARDIAC CATHETERIZATION  "quite a few"  . [place]  . CHOLECYSTECTOMY OPEN  1970's  .  CORONARY ANGIOGRAM  2000   No significant CAD  . ELECTROPHYSIOLOGIC STUDY N/A 06/14/2014   Procedure: A-Flutter/A-Tach/SVT Ablation;  Surgeon: Evans Lance, MD;  Location: [facility];  Service: Cardiovascular;  Laterality: N/A;  . INSERTION OF ICD  03/17/2017   BIV  . MYRINGOTOMY WITH TUBE PLACEMENT Right 2015  . PERIPHERAL VASCULAR  INTERVENTION  12/17/2016   Procedure: PERIPHERAL VASCULAR INTERVENTION;  Surgeon: Wellington Hampshire, MD;  Location: [facility];  Service: Cardiovascular;;  Right common femoral PTA and Stent  . RIGHT/LEFT HEART CATH AND CORONARY ANGIOGRAPHY Bilateral 11/06/2016   Procedure: RIGHT/LEFT HEART CATH AND CORONARY ANGIOGRAPHY;  Surgeon: Wellington Hampshire, MD;  Location: [facility];  Service: Cardiovascular;  Laterality: Bilateral;  . SVT ABLATION  06/14/2014  . TUBAL LIGATION  1972  . VSD REPAIR  1958; 1967     Current Outpatient Medications  Medication Sig Dispense Refill  . albuterol (VENTOLIN HFA) 108 (90 Base) MCG/ACT inhaler INHALE 2 PUFFS INTO THE LUNGS EVERY 6 (SIX) HOURS AS NEEDED FOR WHEEZING OR SHORTNESS OF BREATH 8.5 g 5  . ALPRAZolam (XANAX) 0.25 MG tablet 1 tab by mouth twice per day as needed 60 tablet 5  . aspirin 81 MG tablet Take 1 tablet (81 mg total) by mouth daily. 100 tablet 99  . carvedilol (COREG) 3.125 MG tablet Take 1 tablet (3.125 mg total) by mouth 2 (two) times daily with a meal. 180 tablet 3  . chlorpheniramine-HYDROcodone (TUSSIONEX) 10-8 MG/5ML SUER TAKE 1 TEASPOONFUL (5ML) BY MOUTH EVERY 12 HOURS AS NEEDED FOR COUGH 115 mL 0  . Cyanocobalamin (B-12 COMPLIANCE INJECTION IJ) Inject as directed. Injection every 30 days    . digoxin (LANOXIN) 0.125 MG tablet TAKE 1/2 TABLET BY MOUTH DAILY 45 tablet 0  . ENTRESTO 24-26 MG TAKE 1 TABLET BY MOUTH TWICE A DAY 180 tablet 3  . furosemide (LASIX) 20 MG tablet TAKE 1 TABLET BY MOUTH DAILY FOR 2 DAYS,THEN 1 TABLET ONCE A WEEK. 30 tablet 5  . ibuprofen (ADVIL,MOTRIN) 200 MG tablet Take 200 mg by mouth daily as needed for headache.    . rosuvastatin (CRESTOR) 10 MG tablet TAKE 1 TABLET BY MOUTH DAILY 90 tablet 3  . spironolactone (ALDACTONE) 25 MG tablet TAKE ONE TABLET BY MOUTH AT BEDTIME 90 tablet 3   No current facility-administered medications for this visit.    Allergies:   Mupirocin and Codeine     Social History:  The patient  reports that she has been smoking cigarettes. She has a 11.55 pack-year smoking history. She has never used smokeless tobacco. She reports current alcohol use of about 1.0 standard drinks of alcohol per week. She reports that she does not use drugs.   Family History:  The patient's family history includes Alcohol abuse in an other family member; Arthritis in some other family members; Cancer in her father, mother, and another family member; Hypertension in some other family members; Stroke in some other family members; Throat cancer in an other family member.    ROS:  Please see the history of present illness.   Otherwise, review of systems are positive for none.   All other systems are reviewed and negative.    PHYSICAL EXAM: VS:  BP 92/60 (BP Location: Left Arm, Patient Position: Sitting, Cuff Size: Normal)   Pulse 62   Ht 5\' 1"  (1.549 m)   Wt 112 lb (50.8 kg)   BMI 21.16 kg/m  , BMI Body mass index  is 21.16 kg/m. GEN: Well nourished, well developed, in no acute distress  HEENT: normal  Neck: no JVD, carotid bruits, or masses Cardiac: RRR; no  rubs, or gallops,no edema.  3 out of 6 holosystolic murmur at the left sternal border and apex. Respiratory:  clear to auscultation bilaterally, normal work of breathing GI: soft, nontender, nondistended, + BS MS: no deformity or atrophy  Skin: warm and dry, no rash Neuro:  Strength and sensation are intact Psych: euthymic mood, full affect Vascular: Radial pulse is absent bilaterally.  Distal pedal pulses are faint but palpable on the right side and not palpable on the left.   EKG:  EKG is ordered today. The ekg ordered today demonstrates dual-chamber biventricular pacemaker.   Recent Labs: 11/01/2018: B Natriuretic Peptide 331.3 05/30/2019: ALT 11; BUN 20; Creatinine, Ser 0.91; Hemoglobin 13.0; Platelets 229.0; Potassium 4.9; Sodium 136; TSH 0.99    Lipid Panel    Component Value Date/Time   CHOL  155 05/30/2019 1458   TRIG 52.0 05/30/2019 1458   HDL 79.80 05/30/2019 1458   CHOLHDL 2 05/30/2019 1458   VLDL 10.4 05/30/2019 1458   LDLCALC 65 05/30/2019 1458   LDLDIRECT 112.6 03/16/2013 1057      Wt Readings from Last 3 Encounters:  06/16/19 112 lb (50.8 kg)  05/30/19 108 lb 6 oz (49.2 kg)  05/10/19 109 lb 9.6 oz (49.7 kg)       No flowsheet data found.    ASSESSMENT AND PLAN:  1.  Peripheral arterial disease .  Status post endovascular revascularization of the right common femoral artery with known restenosis.  In spite of that, she has no symptoms and has palpable pulses on the right.  Continue medical therapy for now and monitor for symptoms.  2.  Chronic systolic heart failure: Due to nonischemic cardiomyopathy.  She appears to be euvolemic and currently on optimal medical therapy followed at the heart failure clinic.  3.  Hyperlipidemia: Continue treatment with rosuvastatin with a target LDL of less than 70.  Most recent lipid profile showed an LDL of 65.  4.  Tobacco use: I congratulated the patient on recent smoking cessation.    Disposition:   FU with me in 12 month  Signed,  Kathlyn Sacramento, MD  06/16/2019 10:43 AM    [place]

## 2019-06-16 NOTE — Patient Instructions (Signed)
Medication Instructions:  Your physician recommends that you continue on your current medications as directed. Please refer to the Current Medication list given to you today.  *If you need a refill on your cardiac medications before your next appointment, please call your pharmacy*   Lab Work: None ordered  If you have labs (blood work) drawn today and your tests are completely normal, you will receive your results only by: Marland Kitchen MyChart Message (if you have MyChart) OR . A paper copy in the mail If you have any lab test that is abnormal or we need to change your treatment, we will call you to review the results.   Testing/Procedures: None ordered    Follow-Up: At [facility], you and your health needs are our priority.  As part of our continuing mission to provide you with exceptional heart care, we have created designated Provider Care Teams.  These Care Teams include your primary Cardiologist (physician) and Advanced Practice Providers (APPs -  Physician Assistants and Nurse Practitioners) who all work together to provide you with the care you need, when you need it.  We recommend signing up for the patient portal called "MyChart".  Sign up information is provided on this After Visit Summary.  MyChart is used to connect with patients for Virtual Visits (Telemedicine).  Patients are able to view lab/test results, encounter notes, upcoming appointments, etc.  Non-urgent messages can be sent to your provider as well.   To learn more about what you can do with MyChart, go to [email or web address].    Your next appointment:   12 month(s)  The format for your next appointment:   In Person  Provider:    You may see Dr. Fletcher Anon or one of the following Advanced Practice Providers on your designated Care Team:    Murray Hodgkins, NP  Christell Faith, PA-C  Marrianne Mood, PA-C

## 2019-06-21 ENCOUNTER — Ambulatory Visit (INDEPENDENT_AMBULATORY_CARE_PROVIDER_SITE_OTHER): Payer: Medicare Other | Admitting: *Deleted

## 2019-06-21 DIAGNOSIS — I5022 Chronic systolic (congestive) heart failure: Secondary | ICD-10-CM | POA: Diagnosis not present

## 2019-06-21 NOTE — Progress Notes (Signed)
Remote ICD transmission.   

## 2019-06-22 LAB — CUP PACEART REMOTE DEVICE CHECK
Battery Remaining Longevity: 108 mo
Battery Remaining Percentage: 100 %
Brady Statistic RA Percent Paced: 52 %
Brady Statistic RV Percent Paced: 96 %
Date Time Interrogation Session: 20210601041100
HighPow Impedance: 76 Ohm
Implantable Lead Implant Date: 20190226
Implantable Lead Implant Date: 20190226
Implantable Lead Implant Date: 20190226
Implantable Lead Location: 753858
Implantable Lead Location: 753859
Implantable Lead Location: 753860
Implantable Lead Model: 292
Implantable Lead Model: 4671
Implantable Lead Model: 7740
Implantable Lead Serial Number: 444536
Implantable Lead Serial Number: 726363
Implantable Lead Serial Number: 805938
Implantable Pulse Generator Implant Date: 20190226
Lead Channel Impedance Value: 475 Ohm
Lead Channel Impedance Value: 554 Ohm
Lead Channel Impedance Value: 834 Ohm
Lead Channel Setting Pacing Amplitude: 2 V
Lead Channel Setting Pacing Amplitude: 2.5 V
Lead Channel Setting Pacing Amplitude: 2.6 V
Lead Channel Setting Pacing Pulse Width: 0.4 ms
Lead Channel Setting Pacing Pulse Width: 0.4 ms
Lead Channel Setting Sensing Sensitivity: 0.5 mV
Lead Channel Setting Sensing Sensitivity: 1 mV
Pulse Gen Serial Number: 204176

## 2019-06-29 ENCOUNTER — Ambulatory Visit: Payer: Medicare Other | Admitting: Internal Medicine

## 2019-07-06 ENCOUNTER — Encounter (HOSPITAL_COMMUNITY): Payer: Self-pay | Admitting: Cardiology

## 2019-07-09 ENCOUNTER — Other Ambulatory Visit (HOSPITAL_COMMUNITY): Payer: Self-pay | Admitting: Cardiology

## 2019-07-11 ENCOUNTER — Other Ambulatory Visit (HOSPITAL_COMMUNITY): Payer: Self-pay | Admitting: *Deleted

## 2019-07-11 ENCOUNTER — Encounter (HOSPITAL_COMMUNITY): Payer: Self-pay

## 2019-07-11 MED ORDER — DIGOXIN 125 MCG PO TABS: 62.5000 ug | ORAL_TABLET | Freq: Every day | ORAL | 3 refills | Status: DC

## 2019-07-12 ENCOUNTER — Institutional Professional Consult (permissible substitution): Payer: Medicare Other | Admitting: Internal Medicine

## 2019-07-26 ENCOUNTER — Other Ambulatory Visit: Payer: Self-pay | Admitting: Internal Medicine

## 2019-07-26 NOTE — Telephone Encounter (Signed)
Done erx 

## 2019-08-05 ENCOUNTER — Encounter: Payer: Self-pay | Admitting: Internal Medicine

## 2019-08-05 MED ORDER — HYDROCOD POLST-CPM POLST ER 10-8 MG/5ML PO SUER: ORAL | 0 refills | Status: DC

## 2019-08-09 ENCOUNTER — Ambulatory Visit (HOSPITAL_COMMUNITY)
Admission: RE | Admit: 2019-08-09 | Discharge: 2019-08-09 | Disposition: A | Discharge status: Home / Self Care | Payer: Medicare Other | Source: Ambulatory Visit | Attending: Cardiology | Admitting: Cardiology

## 2019-08-09 ENCOUNTER — Encounter (HOSPITAL_COMMUNITY): Payer: Self-pay | Admitting: Cardiology

## 2019-08-09 ENCOUNTER — Other Ambulatory Visit: Payer: Self-pay

## 2019-08-09 VITALS — BP 96/58 | HR 76 | Wt 110.0 lb

## 2019-08-09 DIAGNOSIS — Z7984 Long term (current) use of oral hypoglycemic drugs: Secondary | ICD-10-CM | POA: Insufficient documentation

## 2019-08-09 DIAGNOSIS — J449 Chronic obstructive pulmonary disease, unspecified: Secondary | ICD-10-CM | POA: Insufficient documentation

## 2019-08-09 DIAGNOSIS — I428 Other cardiomyopathies: Secondary | ICD-10-CM | POA: Diagnosis not present

## 2019-08-09 DIAGNOSIS — E785 Hyperlipidemia, unspecified: Secondary | ICD-10-CM | POA: Diagnosis not present

## 2019-08-09 DIAGNOSIS — Z7982 Long term (current) use of aspirin: Secondary | ICD-10-CM | POA: Diagnosis not present

## 2019-08-09 DIAGNOSIS — Z7901 Long term (current) use of anticoagulants: Secondary | ICD-10-CM | POA: Insufficient documentation

## 2019-08-09 DIAGNOSIS — I2510 Atherosclerotic heart disease of native coronary artery without angina pectoris: Secondary | ICD-10-CM | POA: Diagnosis not present

## 2019-08-09 DIAGNOSIS — R0789 Other chest pain: Secondary | ICD-10-CM | POA: Insufficient documentation

## 2019-08-09 DIAGNOSIS — F17210 Nicotine dependence, cigarettes, uncomplicated: Secondary | ICD-10-CM | POA: Diagnosis not present

## 2019-08-09 DIAGNOSIS — Z79899 Other long term (current) drug therapy: Secondary | ICD-10-CM | POA: Insufficient documentation

## 2019-08-09 DIAGNOSIS — I5022 Chronic systolic (congestive) heart failure: Secondary | ICD-10-CM | POA: Diagnosis not present

## 2019-08-09 DIAGNOSIS — I480 Paroxysmal atrial fibrillation: Secondary | ICD-10-CM | POA: Diagnosis not present

## 2019-08-09 LAB — BASIC METABOLIC PANEL
Anion gap: 7 (ref 5–15)
BUN: 24 mg/dL — ABNORMAL HIGH (ref 8–23)
CO2: 28 mmol/L (ref 22–32)
Calcium: 9.3 mg/dL (ref 8.9–10.3)
Chloride: 100 mmol/L (ref 98–111)
Creatinine, Ser: 1.04 mg/dL — ABNORMAL HIGH (ref 0.44–1.00)
GFR calc Af Amer: >60 mL/min (ref >60)
GFR calc non Af Amer: 54 mL/min — ABNORMAL LOW (ref >60)
Glucose, Bld: 104 mg/dL — ABNORMAL HIGH (ref 70–99)
Potassium: 4.1 mmol/L (ref 3.5–5.1)
Sodium: 135 mmol/L (ref 135–145)

## 2019-08-09 LAB — DIGOXIN LEVEL: Digoxin Level: 0.5 ng/mL — ABNORMAL LOW (ref 0.8–2.0)

## 2019-08-09 MED ORDER — DAPAGLIFLOZIN PROPANEDIOL 10 MG PO TABS: 10.0000 mg | ORAL_TABLET | Freq: Every day | ORAL | 6 refills | Status: DC

## 2019-08-09 NOTE — Progress Notes (Signed)
PCP: Dr. Jenny Reichmann Cardiology: Dr. Harrington Challenger HF Cardiology: Dr. Aundra Dubin  74 y.o.with history of VSD repair as a child, COPD/active smoking, PAD, and chronic systolic CHF was referred by Dr. Harrington Challenger for evaluation of CHF.   Patient has a long history of cardiomyopathy.  She had VSD repaired as a child and imaging has not showed residual VSD.  Cardiac MRI in 11/09 showed EF 36%.  Echo in 12/15 showed EF 40-45%.  Echo in 3/18 showed EF down to 15-20% with moderate RV systolic dysfunction.  RHC/LHC in 10/18 showed nonobstructive coronary but was concerning for low output (CI 1.33).  She had a right lower leg slowly healing ulcer.  She had peripheral arteriogram with bilateral occluded CFAs. She had PCI to right CFA.    CPX in 1/19 showed moderate to severe functional limitation, combination of HF and lung disease, probably more related to the lung disease.  PFTs in 12/18 showed severe COPD.   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    7/19 peripheral arterial dopplers showed significant in-stent restenosis right SFA stent.  Echo in 4/21 showed EF 35-40% with mild AI.  CPX in 5/21 showed severe COPD, mild-moderate HF limitation.   She returns for followup of CHF.  She has started smoking again but only a couple of cigarettes a day.  She wants to try to quit completely again.  No claudication-type pain.  Still dyspneic with moderate exertion, no change from the past.  She gets chest tightness with emotional stress but not exertion, this is a chronic pattern.  Weight is stable.  She is not taking Lasix.  Occasional coughing.   ECG: a-BiV pacing (personally reviewed)  Labs (7/18): LDL 61 Labs (11/18): K 4.2, creatinine 0.7, hgb 13.7 Labs (1/19): K 4.3, creatinine 1.07 Labs (2/19): K 4.5, creatinine 1.14 Labs (7/19): K 4.4, creatinine 1.04, LDL 61, digoxin 1.2 Labs (9/19): digoxin 0.7, K 4.8, creatinine 0.93 Labs (1/20): K 4.3, creatinine 1.04 Labs (12/20): LDL 63, HDL 87, K 4.7, creatinine 1.01 Labs  (5/21): LDL 65, HDL 80, K 4.9, creatinine 0.91  PMH: 1. VSD: s/p repair at [facility] as child.  From description, sounds like muscular VSD.  2. Atrial tachycardia s/p ablation.  3. Hyperlipidemia 4. COPD: Active smoker.  - PFTs (12/18): Severe obstructive lung disease.  5. Atrial fibrillation: Paroxysmal.   Noted only transiently.   6. PAD: ABIs 11/18 with 0.62 on right, 0.72 on left.  - Angiogram 11/18 showed totally occluded bilateral CFAs.  Patient had stent to right CFA.  ABIs in 12/18 were normal on right. - Peripheral arterial dopplers (7/19) with significant in-stent restenosis right CFA.   - ABIs (10/20): ABI 0.8 right, 0.74 left 7. Chronic systolic CHF: Nonischemic cardiomyopathy.   - cMRI (11/09): EF 36%, VSD patch in anterior ventricular septum - Echo (12/15): EF 40-45% - Echo (3/18): EF 15-20%, moderate LV dilation, moderate MR, moderate RV dilation with moderately decreased systolic function.  - LHC/RHC (10/18): 3+ MR, EF < 25%, minimal nonobstructive CAD; PA 41/21, LVEDP 18, CI 1.33.  - CPX (1/19): peak VO2 13.4, VE/VCO2 slope 43, RER 1.07.  Mod-severe functional limitation due to combination of HF and lung disease, probably more related to lung disease.  - [name] CRT-D device.  - Echo (7/19): EF 25-30%, mild LV dilation, mild MR, normal RV size and systolic function.  - Echo (4/21): EF 35-40%, mild AI - CPX (5/21): severe COPD, mild-moderate HF limitation.  8. PVCs: Zio patch 9/19 with 6%  PVCs.   SH: Prior smoker, lives in [place], married.  FH: Mother with PAD, sister died at birth from congenital heart disease.   ROS: All systems reviewed and negative except as per HPI.   Current Outpatient Medications  Medication Sig Dispense Refill   albuterol (VENTOLIN HFA) 108 (90 Base) MCG/ACT inhaler INHALE 2 PUFFS INTO THE LUNGS EVERY 6 (SIX) HOURS AS NEEDED FOR WHEEZING OR SHORTNESS OF BREATH 8.5 g 5   ALPRAZolam (XANAX) 0.25 MG tablet TAKE 1 TABLET BY MOUTH TWICE  PER DAY AS NEEDED 60 tablet 5   aspirin 81 MG tablet Take 1 tablet (81 mg total) by mouth daily. 100 tablet 99   carvedilol (COREG) 3.125 MG tablet Take 1 tablet (3.125 mg total) by mouth 2 (two) times daily with a meal. 180 tablet 3   chlorpheniramine-HYDROcodone (TUSSIONEX) 10-8 MG/5ML SUER TAKE 1 TEASPOONFUL (5ML) BY MOUTH EVERY 12 HOURS AS NEEDED FOR COUGH 115 mL 0   Cyanocobalamin (B-12 COMPLIANCE INJECTION IJ) Inject as directed. Injection every 30 days     digoxin (LANOXIN) 0.125 MG tablet Take 0.5 tablets (62.5 mcg total) by mouth daily. 45 tablet 3   ENTRESTO 24-26 MG TAKE 1 TABLET BY MOUTH TWICE A DAY 180 tablet 3   ibuprofen (ADVIL,MOTRIN) 200 MG tablet Take 200 mg by mouth daily as needed for headache.     rosuvastatin (CRESTOR) 10 MG tablet TAKE 1 TABLET BY MOUTH DAILY 90 tablet 3   spironolactone (ALDACTONE) 25 MG tablet TAKE ONE TABLET BY MOUTH AT BEDTIME 90 tablet 3   dapagliflozin propanediol (FARXIGA) 10 MG TABS tablet Take 1 tablet (10 mg total) by mouth daily before breakfast. 30 tablet 6   furosemide (LASIX) 20 MG tablet TAKE 1 TABLET BY MOUTH DAILY FOR 2 DAYS,THEN 1 TABLET ONCE A WEEK. (Patient not taking: Reported on 08/09/2019) 30 tablet 5   No current facility-administered medications for this encounter.   BP (!) 96/58    Pulse 76    Wt 49.9 kg (110 lb)    SpO2 95%    BMI 20.78 kg/m  General: NAD Neck: No JVD, no thyromegaly or thyroid nodule.  Lungs: Mildly distant BS CV: Nondisplaced PMI.  Heart regular S1/S2, no S3/S4, no murmur.  No peripheral edema.  No carotid bruit.  Unable to palpate pulses right foot.  Abdomen: Soft, nontender, no hepatosplenomegaly, no distention.  Skin: Intact without lesions or rashes.  Neurologic: Alert and oriented x 3.  Psych: Normal affect. Extremities: No clubbing or cyanosis.  HEENT: Normal.   Assessment/Plan: 1.  PAD: She had PCI to occluded right CFA in 11/18, but 7/19 peripheral arterial dopplers showed significant  in-stent restenosis in the right CFA. She has occluded left CFA also that was not intervened on. She denies claudication or pedal ulcerations. She has started smoking again (1-2 cigs/day).  - Medical management in absence significant symptoms, saw Dr. Fletcher Anon recently.   - Continue statin.  - Needs to quit smoking => she is going to try a low dose nicotine patch.  2. CAD: Nonobstructive on 10/18 cath. Rare atypical chest pain.  - She is on ASA 81 and statin.  3. Hyperlipidemia: Good lipids in 5/21.  Continue Crestor.   4. COPD: Severe by PFTs on 5/21 CPX. COPD plays a significant role in her dyspnea. She has started back to smoking a couple cigarettes/day.  - Needs to quit smoking completely, as above, will use low dose nicotine patch.  6. Chronic systolic CHF: Nonischemic cardiomyopathy.  RHC/LHC in 10/18 showed no nonobstructive coronary disease but CI was very low at 1.33.  Her low cardiac output was out of proportion to her symptoms.  [name] CRT-D device.  CPX in 5/21 showed severe COPD but only mild-moderate HF limitation.  Most recent echo in 4/21 showed EF 35-40%.  On exam, she is not volume overloaded. NYHA class II-III symptoms, likely due mostly to COPD.   - Continue Entresto 24/26 bid. BP too soft to increase.  - Continue Coreg 3.125 mg bid.  BP too soft to increase.    - Continue spironolactone 25 mg daily, take in evening as this helps to limit orthostatic symptoms.  - Continue digoxin, check level today.  - Start Farxiga 10 mg daily.  BMET today and 10 days.   Followup in 3 months.   Loralie Champagne 08/09/2019

## 2019-08-09 NOTE — Patient Instructions (Signed)
Start Farxiga 10 mg daily  Labs done today, your results will be available in MyChart, we will contact you for abnormal readings.  Please call our office in October to schedule your follow up appointment  If you have any questions or concerns before your next appointment please send [place] a message through [place] or call our office at [phone].    TO LEAVE A MESSAGE FOR THE NURSE SELECT OPTION 2, PLEASE LEAVE A MESSAGE INCLUDING:  YOUR NAME  DATE OF BIRTH  CALL BACK NUMBER  REASON FOR CALL**this is important as we prioritize the call backs  YOU WILL RECEIVE A CALL BACK THE SAME DAY AS LONG AS YOU CALL BEFORE 4:00 PM  At the [facility], you and your health needs are our priority. As part of our continuing mission to provide you with exceptional heart care, we have created designated Provider Care Teams. These Care Teams include your primary Cardiologist (physician) and Advanced Practice Providers (APPs- Physician Assistants and Nurse Practitioners) who all work together to provide you with the care you need, when you need it.   You may see any of the following providers on your designated Care Team at your next follow up:  Dr Glori Bickers  Dr Haynes Kerns, NP  Lyda Jester, [place]  Audry Riles, PharmD   Please be sure to bring in all your medications bottles to every appointment.

## 2019-09-05 ENCOUNTER — Other Ambulatory Visit: Payer: Self-pay

## 2019-09-05 ENCOUNTER — Encounter: Payer: Self-pay | Admitting: Internal Medicine

## 2019-09-05 ENCOUNTER — Ambulatory Visit: Payer: Medicare Other | Admitting: Internal Medicine

## 2019-09-05 DIAGNOSIS — J449 Chronic obstructive pulmonary disease, unspecified: Secondary | ICD-10-CM | POA: Diagnosis not present

## 2019-09-05 DIAGNOSIS — F17210 Nicotine dependence, cigarettes, uncomplicated: Secondary | ICD-10-CM

## 2019-09-05 MED ORDER — BREZTRI AEROSPHERE 160-9-4.8 MCG/ACT IN AERO: 2.0000 | INHALATION_SPRAY | Freq: Two times a day (BID) | RESPIRATORY_TRACT | 11 refills | Status: DC

## 2019-09-05 MED ORDER — PREDNISONE 10 MG PO TABS: ORAL_TABLET | ORAL | 0 refills | Status: DC

## 2019-09-05 MED ORDER — BREZTRI AEROSPHERE 160-9-4.8 MCG/ACT IN AERO: 2.0000 | INHALATION_SPRAY | Freq: Every day | RESPIRATORY_TRACT | 0 refills | Status: DC

## 2019-09-05 NOTE — Assessment & Plan Note (Addendum)
Active smoker - PFT's  01/12/17   FEV1 0.88 (44 % ) ratio 0.51  p 16 % improvement from saba p ? prior to study with DLCO  9.68 (47%) corrects to 3.47 (78%)  for alv volume and FV curve slt atypical concave pattern  - 09/05/2019  After extensive coaching inhaler device,  effectiveness =    75% try breztri   DDX of  difficult airways management almost all start with A and  include Adherence, Ace Inhibitors, Acid Reflux, Active Sinus Disease, Alpha 1 Antitripsin deficiency, Anxiety masquerading as Airways dz,  ABPA,  Allergy(esp in young), Aspiration (esp in elderly), Adverse effects of meds,  Active smoking or vaping, A bunch of PE's (a small clot burden can't cause this syndrome unless there is already severe underlying pulm or vascular dz with poor reserve) plus two Bs  = Bronchiectasis and Beta blocker use..and one C= CHF  Adherence is always the initial "prime suspect" and is a multilayered concern that requires a "trust but verify" approach in every patient - starting with knowing how to use medications, especially inhalers, correctly, keeping up with refills and understanding the fundamental difference between maintenance and prns vs those medications only taken for a very short course and then stopped and not refilled.  - see hfa teaching  - return with all meds in hand using a trust but verify approach to confirm accurate Medication  Reconciliation The principal here is that until we are certain that the  patients are doing what we've asked, it makes no sense to ask them to do more.    Active smoking top of the rest of the usual suspects  ? Adverse drug effects > ? entresto contributing to cough but benefits clearly outweigh any side effects at this point  ? Allergy /asthma component > high dose ICS/ Prednisone 10 mg take  4 each am x 2 days,   2 each am x 2 days,  1 each am x 2 days and stop   ? Alpha one AT def > needs screening next ov   ? BB effects > probably ok for low dose coreg   ?  CHF/ cardiac asthma >  Cards following   She is Group D in terms of symptom/risk and laba/lama/ICS  therefore appropriate rx at this point >>>  breztri 2 bid/ Prednisone 10 mg take  4 each am x 2 days,   2 each am x 2 days,  1 each am x 2 days and stop.  Advised:  formulary restrictions will be an ongoing challenge for the forseable future and I would be happy to pick an alternative if the pt will first  provide me a list of them -  pt  will need to return here for training for any new device that is required eg dpi vs hfa vs respimat.    In the meantime we can always provide samples so that the patient never runs out of any needed respiratory medications.

## 2019-09-05 NOTE — Progress Notes (Addendum)
Kimberly [name], female    DOB: 05-04-1945,    MRN: [id number]   Brief patient profile:  36 yowf active smoker  GOLD III  referred to pulmonary clinic 09/05/2019 by Dr  Aundra Dubin for sob     History of Present Illness  09/05/2019  Pulmonary/ 1st office eval/Zakkary Thibault  - no maint rx  Chief Complaint  Patient presents with  . Follow-up    pt needs help to quit smoking.pt is wearing patches .pt can'y take chantix  Dyspnea:  Does [employer] s maint rx but struggles  Cough: typical for her takes 4-5 coughs with mucoid mucus and then  good to go  Sleep: sleeps bed flat/ one pillow on side  SABA use: one  5-6 x daily  No obvious day to day or daytime variability or assoc   purulent sputum or mucus plugs or hemoptysis or cp or chest tightness, subjective wheeze or overt sinus or hb symptoms.   Sleeping  without nocturnal  exacerbation  of respiratory  c/o's or need for noct saba. Also denies any obvious fluctuation of symptoms with weather or environmental changes or other aggravating or alleviating factors except as outlined above   No unusual exposure hx or h/o childhood pna/ asthma or knowledge of premature birth.  Current Allergies, Complete Past Medical History, Past Surgical History, Family History, and Social History were reviewed in [employer] record.  ROS  The following are not active complaints unless bolded Hoarseness, sore throat, dysphagia, dental problems, itching, sneezing,  nasal congestion or discharge of excess mucus or purulent secretions, ear ache,   fever, chills, sweats, unintended wt loss or wt gain, classically pleuritic or exertional cp,  orthopnea pnd or arm/hand swelling  or leg swelling, presyncope, palpitations, abdominal pain, anorexia, nausea, vomiting, diarrhea  or change in bowel habits or change in bladder habits, change in stools or change in urine, dysuria, hematuria,  rash, arthralgias, visual complaints, headache, numbness, weakness or  ataxia or problems with walking or coordination,  change in mood or  memory.            Past Medical History:  Diagnosis Date  . AICD (automatic cardioverter/defibrillator) present 03/17/2017  . Anxiety   . Atrial tachycardia ([place])   . Bursitis of shoulder, right, adhesive   . CHF (congestive heart failure) ([place])   . Chronic bronchitis ([place])    "1-2 times/yr" (01/23/2014)  . COPD (chronic obstructive pulmonary disease) ([place])   . CVD (cerebrovascular disease)   . Dyslipidemia   . Dysrhythmia   . Frequency of urination   . GERD (gastroesophageal reflux disease)   . Heart murmur   . History of stomach ulcers   . HTN (hypertension) 02/22/2011  . Migraines    "stopped many years ago" (06/14/2014)  . Osteoporosis 08/19/2016  . Pericarditis   . Pneumonia "10 times" (06/14/2014)  . 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 Silent myocardial infarction ([place]) "late 1990's"  . Stress incontinence    "was suppose to have been tacked up years ago but I didn't do it"  . Syncope, near    Associated with atrial tachycardia-event recorder 1/16  . Thoracic outlet syndrome   . VSD (ventricular septal defect)     Outpatient Medications Prior to Visit  Medication Sig Dispense Refill  . albuterol (VENTOLIN HFA) 108 (90 Base) MCG/ACT inhaler INHALE 2 PUFFS INTO THE LUNGS EVERY 6 (SIX) HOURS AS NEEDED FOR WHEEZING OR SHORTNESS OF BREATH  8.5 g 5  . ALPRAZolam (XANAX) 0.25 MG tablet TAKE 1 TABLET BY MOUTH TWICE PER DAY AS NEEDED 60 tablet 5  . aspirin 81 MG tablet Take 1 tablet (81 mg total) by mouth daily. 100 tablet 99  . carvedilol (COREG) 3.125 MG tablet Take 1 tablet (3.125 mg total) by mouth 2 (two) times daily with a meal. 180 tablet 3  . chlorpheniramine-HYDROcodone (TUSSIONEX) 10-8 MG/5ML SUER TAKE 1 TEASPOONFUL (5ML) BY MOUTH EVERY 12 HOURS AS NEEDED FOR COUGH 115 mL 0  . dapagliflozin propanediol (FARXIGA) 10 MG TABS tablet Take 1 tablet (10 mg total) by  mouth daily before breakfast. 30 tablet 6  . digoxin (LANOXIN) 0.125 MG tablet Take 0.5 tablets (62.5 mcg total) by mouth daily. 45 tablet 3  . ENTRESTO 24-26 MG TAKE 1 TABLET BY MOUTH TWICE A DAY 180 tablet 3  . furosemide (LASIX) 20 MG tablet TAKE 1 TABLET BY MOUTH DAILY FOR 2 DAYS,THEN 1 TABLET ONCE A WEEK. 30 tablet 5  . ibuprofen (ADVIL,MOTRIN) 200 MG tablet Take 200 mg by mouth daily as needed for headache.    . rosuvastatin (CRESTOR) 10 MG tablet TAKE 1 TABLET BY MOUTH DAILY 90 tablet 3  . spironolactone (ALDACTONE) 25 MG tablet TAKE ONE TABLET BY MOUTH AT BEDTIME 90 tablet 3  . Cyanocobalamin (B-12 COMPLIANCE INJECTION IJ) Inject as directed. Injection every 30 days     No facility-administered medications prior to visit.     Objective:     BP 116/72   Pulse 97   Temp (!) 97 F (36.1 C) (Oral)   Ht 5\' 1"  (1.549 m)   Wt 109 lb 6.4 oz (49.6 kg)   SpO2 98%   BMI 20.67 kg/m   SpO2: 98 %   amb wf with obvious component pseudowheezes   HEENT : pt wearing mask not removed for exam due to covid - 19 concerns.    NECK :  without JVD/Nodes/TM/ nl carotid upstrokes bilaterally   LUNGS: no acc muscle use,  Mild barrel  contour chest wall with bilateral  Distant bs s audible wheeze and  without cough on insp or exp maneuvers  and mild  Hyperresonant  to  percussion bilaterally     CV:  RRR  no s3 or murmur or increase in P2, and no edema   ABD:  soft and nontender with pos end  insp Hoover's  in the supine position. No bruits or organomegaly appreciated, bowel sounds nl  MS:   Nl gait/  ext warm without deformities, calf tenderness, cyanosis or clubbing No obvious joint restrictions   SKIN: warm and dry without lesions    NEURO:  alert, approp, nl sensorium with  no motor or cerebellar deficits apparent.        I personally reviewed images and agree with radiology impression as follows:  CXR:   05/30/19  Pa and lat  COPD without acute abnormality.  No new focal  abnormality is seen.     Assessment   COPD GOLD III / still smoking  Active smoker - PFT's  01/12/17   FEV1 0.88 (44 % ) ratio 0.51  p 16 % improvement from saba p ? prior to study with DLCO  9.68 (47%) corrects to 3.47 (78%)  for alv volume and FV curve slt atypical concave pattern  - 09/05/2019  After extensive coaching inhaler device,  effectiveness =    75% try breztri   DDX of  difficult airways management almost all start  with A and  include Adherence, Ace Inhibitors, Acid Reflux, Active Sinus Disease, Alpha 1 Antitripsin deficiency, Anxiety masquerading as Airways dz,  ABPA,  Allergy(esp in young), Aspiration (esp in elderly), Adverse effects of meds,  Active smoking or vaping, A bunch of PE's (a small clot burden can't cause this syndrome unless there is already severe underlying pulm or vascular dz with poor reserve) plus two Bs  = Bronchiectasis and Beta blocker use..and one C= CHF  Adherence is always the initial "prime suspect" and is a multilayered concern that requires a "trust but verify" approach in every patient - starting with knowing how to use medications, especially inhalers, correctly, keeping up with refills and understanding the fundamental difference between maintenance and prns vs those medications only taken for a very short course and then stopped and not refilled.  - see hfa teaching  - return with all meds in hand using a trust but verify approach to confirm accurate Medication  Reconciliation The principal here is that until we are certain that the  patients are doing what we've asked, it makes no sense to ask them to do more.    Active smoking top of the rest of the usual suspects  ? Adverse drug effects > ? entresto contributing to cough but benefits clearly outweigh any side effects at this point  ? Allergy /asthma component > high dose ICS/ Prednisone 10 mg take  4 each am x 2 days,   2 each am x 2 days,  1 each am x 2 days and stop   ? BB effects > probably  ok for low dose coreg   ? CHF/ cardiac asthma >  Cards following   She is Group D in terms of symptom/risk and laba/lama/ICS  therefore appropriate rx at this point >>>  breztri 2 bid/ Prednisone 10 mg take  4 each am x 2 days,   2 each am x 2 days,  1 each am x 2 days and stop.  Advised:  formulary restrictions will be an ongoing challenge for the forseable future and I would be happy to pick an alternative if the pt will first  provide me a list of them -  pt  will need to return here for training for any new device that is required eg dpi vs hfa vs respimat.    In the meantime we can always provide samples so that the patient never runs out of any needed respiratory medications.      Cigarette smoker Counseled re importance of smoking cessation but did not meet time criteria for separate billing      .       Each maintenance medication was reviewed in detail including emphasizing most importantly the difference between maintenance and prns and under what circumstances the prns are to be triggered using an action plan format where appropriate.  Total time for H and P, chart review, counseling, teaching device and generating customized AVS unique to this office visit / charting = 45 min            Christinia Gully, MD 09/05/2019

## 2019-09-05 NOTE — Patient Instructions (Addendum)
Plan A = Automatic = Always=    Breztri Take 2 puffs first thing in am and then another 2 puffs about 12 hours later.   Work on inhaler technique:  relax and gently blow all the way out then take a nice smooth deep breath back in, triggering the inhaler at same time you start breathing in.  Hold for up to 5 seconds if you can. Blow out thru nose. Rinse and gargle with water when done     Plan B = Backup (to supplement plan A, not to replace it) Only use your albuterol inhaler as a rescue medication to be used if you can't catch your breath by resting or doing a relaxed purse lip breathing pattern.  - The less you use it, the better it will work when you need it. - Ok to use the inhaler up to 2 puffs  every 4 hours if you must but call for appointment if use goes up over your usual need - Don't leave home without it !!  (think of it like the spare tire for your car)    Prednisone 10 mg take  4 each am x 2 days,   2 each am x 2 days,  1 each am x 2 days and stop   The key is to stop smoking completely before smoking completely stops you!    Please schedule a follow up office visit in 6 weeks, call sooner if needed  Add: needs alpha one AT screen next ov

## 2019-09-05 NOTE — Assessment & Plan Note (Addendum)
Counseled re importance of smoking cessation but did not meet time criteria for separate billing            Each maintenance medication was reviewed in detail including emphasizing most importantly the difference between maintenance and prns and under what circumstances the prns are to be triggered using an action plan format where appropriate.  Total time for H and P, chart review, counseling, teaching device and generating customized AVS unique to this office visit / charting = 45 min      

## 2019-09-07 ENCOUNTER — Telehealth (HOSPITAL_COMMUNITY): Payer: Self-pay | Admitting: Cardiology

## 2019-09-07 ENCOUNTER — Telehealth: Payer: Self-pay | Admitting: Internal Medicine

## 2019-09-07 NOTE — Telephone Encounter (Signed)
Spoke with patient, printed patient assistance packet.  Patient to call tomorrow with fax # to fax form to for her to complete.

## 2019-09-07 NOTE — Telephone Encounter (Signed)
Patient stated DM prescribed Farxiga  10 MG, and it's too expensive, she need help with cost. Thanks

## 2019-09-08 NOTE — Telephone Encounter (Signed)
Pt calling back with fax# to send paperwork.  [phone], Attn:  Salli Real.  Pt's number [phone]

## 2019-09-09 MED ORDER — BREZTRI AEROSPHERE 160-9-4.8 MCG/ACT IN AERO: 2.0000 | INHALATION_SPRAY | Freq: Two times a day (BID) | RESPIRATORY_TRACT | 11 refills | Status: DC

## 2019-09-09 NOTE — Telephone Encounter (Signed)
Patient called to f/u with previous message regarding  Farxiga medication, she took her the last pill  today, pt stated if nothing can be done, to help her with the cost she may need an alternative medication. Please advise

## 2019-09-09 NOTE — Telephone Encounter (Signed)
Patient already has heart failure grant through the [facility] for her Delene Loll. Wilder Glade is also on the the [employer] heart failure fund list. Informed patient that she can use [employer] to help her pay for the [place] as well. Once patient uses the remainder of the grant, will likely need to apply for manufacturers assistance if the foundation remains closed.

## 2019-09-09 NOTE — Telephone Encounter (Signed)
Patient portion of application faxed to [phone].  Script for [employer] out for Dr. Gustavus Bryant signature and paperwork placed in Dr. Morrison Old slot in the cabinet.

## 2019-09-14 NOTE — Telephone Encounter (Signed)
Fax received back from Gustavus Bryant for patient assistance including her portion of the application and financial form to go with application.  Placed in Dr. Gustavus Bryant box.

## 2019-09-19 ENCOUNTER — Ambulatory Visit (INDEPENDENT_AMBULATORY_CARE_PROVIDER_SITE_OTHER): Payer: Medicare Other | Admitting: *Deleted

## 2019-09-19 DIAGNOSIS — I5022 Chronic systolic (congestive) heart failure: Secondary | ICD-10-CM | POA: Diagnosis not present

## 2019-09-19 DIAGNOSIS — I420 Dilated cardiomyopathy: Secondary | ICD-10-CM

## 2019-09-19 LAB — CUP PACEART REMOTE DEVICE CHECK
Battery Remaining Longevity: 108 mo
Battery Remaining Percentage: 100 %
Brady Statistic RA Percent Paced: 52 %
Brady Statistic RV Percent Paced: 96 %
Date Time Interrogation Session: 20210830041200
HighPow Impedance: 68 Ohm
Implantable Lead Implant Date: 20190226
Implantable Lead Implant Date: 20190226
Implantable Lead Implant Date: 20190226
Implantable Lead Location: 753858
Implantable Lead Location: 753859
Implantable Lead Location: 753860
Implantable Lead Model: 292
Implantable Lead Model: 4671
Implantable Lead Model: 7740
Implantable Lead Serial Number: 444536
Implantable Lead Serial Number: 726363
Implantable Lead Serial Number: 805938
Implantable Pulse Generator Implant Date: 20190226
Lead Channel Impedance Value: 475 Ohm
Lead Channel Impedance Value: 556 Ohm
Lead Channel Impedance Value: 799 Ohm
Lead Channel Setting Pacing Amplitude: 2 V
Lead Channel Setting Pacing Amplitude: 2.5 V
Lead Channel Setting Pacing Amplitude: 2.6 V
Lead Channel Setting Pacing Pulse Width: 0.4 ms
Lead Channel Setting Pacing Pulse Width: 0.4 ms
Lead Channel Setting Sensing Sensitivity: 0.5 mV
Lead Channel Setting Sensing Sensitivity: 1 mV
Pulse Gen Serial Number: 204176

## 2019-09-21 NOTE — Progress Notes (Signed)
Remote ICD transmission.   

## 2019-09-21 NOTE — Telephone Encounter (Signed)
Do we know if this has been taken care of?

## 2019-09-22 ENCOUNTER — Telehealth: Payer: Self-pay | Admitting: Internal Medicine

## 2019-09-22 NOTE — Telephone Encounter (Signed)
Called and spoke with patient.  Patient was wanting to know if we heard any updates on [place] Patient assistance. I did let Patient know prescription had been faxed to [facility] per Magda Paganini, 09/22/19 encounter. Patient stated she has opened her last sample of Breztri from Dr Melvyn Novas.  Advise Patient while waiting for assistance, she can call office for samples, as needed. Advised Patient she would be contacted by [place] once assistance has been approved/denied. Understanding stated. Nothing further at this time.

## 2019-09-22 NOTE — Telephone Encounter (Signed)
Dr Melvyn Novas has signed the rx for the breztri and I faxed to pt assistance program this morning.

## 2019-10-13 ENCOUNTER — Telehealth (HOSPITAL_COMMUNITY): Payer: Self-pay | Admitting: *Deleted

## 2019-10-13 NOTE — Telephone Encounter (Signed)
Have her let me know if it continues to occur.  Could be anxiety but would be concerned if she has it frequently.

## 2019-10-13 NOTE — Telephone Encounter (Signed)
Pt aware and agreeable with plan.  

## 2019-10-13 NOTE — Telephone Encounter (Signed)
Pt called stating she had chest tightness for two days on and off. Pt said she has been stressed lately. Pt took a Xanax  Yesterday and the chest tightness went away.  Pt denies experiencing chest pain today but wanted to make Dr.McLean aware. Pt denies swelling and shortness of breath,

## 2019-10-17 ENCOUNTER — Telehealth (HOSPITAL_COMMUNITY): Payer: Self-pay | Admitting: Licensed Clinical Social Worker

## 2019-10-17 NOTE — Telephone Encounter (Signed)
CSW received call from pt requesting help with medication assistance.  States she has two expensive medications, entresto and farxiga, that she was using the [employer] for but has run out of funds.  Clinic pharmacist has put pt on waitlist for PAN but pt state she only has 2-3 weeks left of both medications and won't be able to afford them from the pharmacy so wanted to pursue manufacturer assistance.  CSW sent [place] and [employer] application to patient daughter in [place] office- they will plan to complete and fax back to clinic.  Will continue to follow and assist as needed  Jorge Ny, [facility] Desk#: [phone] Cell#: [phone]

## 2019-10-18 ENCOUNTER — Telehealth (HOSPITAL_COMMUNITY): Payer: Self-pay | Admitting: Licensed Clinical Social Worker

## 2019-10-18 NOTE — Telephone Encounter (Signed)
Pt daughter-in-law faxed back pt completed patient assistance forms for [place] and [employer].  CSW had MD sign and turned in for review- fax confirmations received  Will continue to follow and assist as needed  Jorge Ny, [facility] Desk#: [phone] Cell#: [phone]

## 2019-10-21 ENCOUNTER — Ambulatory Visit: Payer: Medicare Other | Admitting: Internal Medicine

## 2019-10-25 ENCOUNTER — Telehealth (HOSPITAL_COMMUNITY): Payer: Self-pay | Admitting: Pharmacy Technician

## 2019-10-25 NOTE — Telephone Encounter (Signed)
Advanced Heart Failure Patient Advocate Encounter   Patient was approved to receive Entresto from [employer]  Patient ID: [id number]  Effective dates: 10/24/19 through 01/20/20  Patient was approved to receive Farxiga from [place].  Patient ID: [id number]  Effective dates: 10/20/19 through 10/18/20  Her first shipment of [place] is scheduled to be delivered on 10/08. Patient is aware.  Charlann Boxer, CPhT

## 2019-11-02 ENCOUNTER — Encounter (HOSPITAL_COMMUNITY): Payer: Self-pay

## 2019-11-03 ENCOUNTER — Telehealth (HOSPITAL_COMMUNITY): Payer: Self-pay | Admitting: *Deleted

## 2019-11-03 NOTE — Telephone Encounter (Signed)
Pt called to report side effects since taking farxiga. Pt said she is very tired, weak, and has lost weight. Pt said her blood pressure is normal. Pt said she would continue to take the medication if Dr.McLean thinks she really needs to take it but she wanted to report some of the issues she's had since starting medication.   Routed to [place] for advice

## 2019-11-03 NOTE — Telephone Encounter (Signed)
Please have her get a BMET and BP check before we decide on continuing/stopping.

## 2019-11-03 NOTE — Telephone Encounter (Signed)
Pt aware and nurse visit scheduled.  

## 2019-11-07 ENCOUNTER — Other Ambulatory Visit: Payer: Self-pay

## 2019-11-07 ENCOUNTER — Ambulatory Visit (HOSPITAL_COMMUNITY)
Admission: RE | Admit: 2019-11-07 | Discharge: 2019-11-07 | Disposition: A | Discharge status: Home / Self Care | Payer: Medicare Other | Source: Ambulatory Visit | Attending: Internal Medicine | Admitting: Internal Medicine

## 2019-11-07 DIAGNOSIS — I5022 Chronic systolic (congestive) heart failure: Secondary | ICD-10-CM | POA: Diagnosis not present

## 2019-11-07 LAB — BASIC METABOLIC PANEL
Anion gap: 8 (ref 5–15)
BUN: 20 mg/dL (ref 8–23)
CO2: 28 mmol/L (ref 22–32)
Calcium: 9.1 mg/dL (ref 8.9–10.3)
Chloride: 100 mmol/L (ref 98–111)
Creatinine, Ser: 1.1 mg/dL — ABNORMAL HIGH (ref 0.44–1.00)
GFR, Estimated: 50 mL/min — ABNORMAL LOW (ref >60)
Glucose, Bld: 129 mg/dL — ABNORMAL HIGH (ref 70–99)
Potassium: 4.4 mmol/L (ref 3.5–5.1)
Sodium: 136 mmol/L (ref 135–145)

## 2019-11-08 ENCOUNTER — Encounter (HOSPITAL_COMMUNITY): Payer: Self-pay

## 2019-11-08 NOTE — Telephone Encounter (Signed)
Pt notified via mychat.   Dr.McLean wants you to continue farxiga but to keep a blood pressure log and call our office if your bp is low.

## 2019-11-08 NOTE — Telephone Encounter (Signed)
bmet and ekg completed yesterday to decide one continuing or stopping farxiga per Dr.McLean. pt still having the same side effects as mentioned in phone note from 10/14 below.  "Pt called to report side effects since taking farxiga. Pt said she is very tired, weak, and has lost weight. Pt said her blood pressure is normal. Pt said she would continue to take the medication if Dr.McLean thinks she really needs to take it but she wanted to report some of the issues she's had since starting medication. "  Routed to [place] for advice

## 2019-11-12 ENCOUNTER — Other Ambulatory Visit: Payer: Self-pay | Admitting: Internal Medicine

## 2019-11-12 ENCOUNTER — Encounter: Payer: Self-pay | Admitting: Internal Medicine

## 2019-11-12 NOTE — Telephone Encounter (Signed)
Please refill as per office routine med refill policy (all routine meds refilled for 3 mo or monthly per pt preference up to one year from last visit, then month to month grace period for 3 mo, then further med refills will have to be denied)  

## 2019-11-14 ENCOUNTER — Other Ambulatory Visit: Payer: Self-pay

## 2019-11-14 MED ORDER — ALBUTEROL SULFATE HFA 108 (90 BASE) MCG/ACT IN AERS: 2.0000 | INHALATION_SPRAY | Freq: Four times a day (QID) | RESPIRATORY_TRACT | 5 refills | Status: DC | PRN

## 2019-11-17 ENCOUNTER — Ambulatory Visit: Payer: Medicare Other | Admitting: Internal Medicine

## 2019-11-25 ENCOUNTER — Other Ambulatory Visit: Payer: Self-pay

## 2019-11-25 ENCOUNTER — Ambulatory Visit (HOSPITAL_COMMUNITY)
Admission: RE | Admit: 2019-11-25 | Discharge: 2019-11-25 | Disposition: A | Discharge status: Home / Self Care | Payer: Medicare Other | Source: Ambulatory Visit | Attending: Cardiology | Admitting: Cardiology

## 2019-11-25 ENCOUNTER — Telehealth (HOSPITAL_COMMUNITY): Payer: Self-pay | Admitting: *Deleted

## 2019-11-25 VITALS — BP 90/50 | HR 61 | Wt 109.0 lb

## 2019-11-25 DIAGNOSIS — R079 Chest pain, unspecified: Secondary | ICD-10-CM

## 2019-11-25 DIAGNOSIS — I480 Paroxysmal atrial fibrillation: Secondary | ICD-10-CM | POA: Diagnosis not present

## 2019-11-25 DIAGNOSIS — J449 Chronic obstructive pulmonary disease, unspecified: Secondary | ICD-10-CM | POA: Diagnosis not present

## 2019-11-25 DIAGNOSIS — I5022 Chronic systolic (congestive) heart failure: Secondary | ICD-10-CM

## 2019-11-25 DIAGNOSIS — I739 Peripheral vascular disease, unspecified: Secondary | ICD-10-CM | POA: Diagnosis not present

## 2019-11-25 DIAGNOSIS — F17210 Nicotine dependence, cigarettes, uncomplicated: Secondary | ICD-10-CM | POA: Insufficient documentation

## 2019-11-25 DIAGNOSIS — E785 Hyperlipidemia, unspecified: Secondary | ICD-10-CM | POA: Insufficient documentation

## 2019-11-25 DIAGNOSIS — Z7982 Long term (current) use of aspirin: Secondary | ICD-10-CM | POA: Insufficient documentation

## 2019-11-25 DIAGNOSIS — Z79899 Other long term (current) drug therapy: Secondary | ICD-10-CM | POA: Diagnosis not present

## 2019-11-25 DIAGNOSIS — Z95810 Presence of automatic (implantable) cardiac defibrillator: Secondary | ICD-10-CM | POA: Diagnosis not present

## 2019-11-25 DIAGNOSIS — I2510 Atherosclerotic heart disease of native coronary artery without angina pectoris: Secondary | ICD-10-CM | POA: Diagnosis not present

## 2019-11-25 DIAGNOSIS — I428 Other cardiomyopathies: Secondary | ICD-10-CM | POA: Diagnosis not present

## 2019-11-25 LAB — BASIC METABOLIC PANEL
Anion gap: 10 (ref 5–15)
BUN: 18 mg/dL (ref 8–23)
CO2: 27 mmol/L (ref 22–32)
Calcium: 9.3 mg/dL (ref 8.9–10.3)
Chloride: 101 mmol/L (ref 98–111)
Creatinine, Ser: 1.01 mg/dL — ABNORMAL HIGH (ref 0.44–1.00)
GFR, Estimated: 59 mL/min — ABNORMAL LOW (ref >60)
Glucose, Bld: 118 mg/dL — ABNORMAL HIGH (ref 70–99)
Potassium: 4.3 mmol/L (ref 3.5–5.1)
Sodium: 138 mmol/L (ref 135–145)

## 2019-11-25 LAB — DIGOXIN LEVEL: Digoxin Level: 0.3 ng/mL — ABNORMAL LOW (ref 1.0–2.0)

## 2019-11-25 NOTE — Telephone Encounter (Signed)
Patient given detailed instructions per Myocardial Perfusion Study Information Sheet for the test on 11/28/19 at 10:15. Patient notified to arrive 15 minutes early and that it is imperative to arrive on time for appointment to keep from having the test rescheduled.  If you need to cancel or reschedule your appointment, please call the office within 24 hours of your appointment. . Patient verbalized understanding.Kimberly [name]

## 2019-11-25 NOTE — Patient Instructions (Addendum)
Take Farxiga dose at night. If this does not resolve symptoms, you may stop taking it  Labs done today, your results will be available in MyChart, we will contact you for abnormal readings.  Your physician has requested that you have an ankle brachial index (ABI). During this test an ultrasound and blood pressure cuff are used to evaluate the arteries that supply the arms and legs with blood. Allow thirty minutes for this exam. There are no restrictions or special instructions. THEY WILL CONTACT YOU TO SCHEDULE.  Your physician has requested that you have a lexiscan myoview. For further information please visit [email or web address]. Please follow instruction sheet, as given.  Your physician recommends that you schedule a follow-up appointment in: 3 months  If you have any questions or concerns before your next appointment please send [place] a message through [place] or call our office at [phone].    TO LEAVE A MESSAGE FOR THE NURSE SELECT OPTION 2, PLEASE LEAVE A MESSAGE INCLUDING: . YOUR NAME . DATE OF BIRTH . CALL BACK NUMBER . REASON FOR CALL**this is important as we prioritize the call backs  YOU WILL RECEIVE A CALL BACK THE SAME DAY AS LONG AS YOU CALL BEFORE 4:00 PM

## 2019-11-27 NOTE — Progress Notes (Signed)
PCP: Dr. Jenny Reichmann Cardiology: Dr. Harrington Challenger HF Cardiology: Dr. Aundra Dubin  74 y.o.with history of VSD repair as a child, COPD/active smoking, PAD, and chronic systolic CHF was referred by Dr. Harrington Challenger for evaluation of CHF.   Patient has a long history of cardiomyopathy.  She had VSD repaired as a child and imaging has not showed residual VSD.  Cardiac MRI in 11/09 showed EF 36%.  Echo in 12/15 showed EF 40-45%.  Echo in 3/18 showed EF down to 15-20% with moderate RV systolic dysfunction.  RHC/LHC in 10/18 showed nonobstructive coronary but was concerning for low output (CI 1.33).  She had a right lower leg slowly healing ulcer.  She had peripheral arteriogram with bilateral occluded CFAs. She had PCI to right CFA.    CPX in 1/19 showed moderate to severe functional limitation, combination of HF and lung disease, probably more related to the lung disease.  PFTs in 12/18 showed severe COPD.   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    7/19 peripheral arterial dopplers showed significant in-stent restenosis right SFA stent.  Echo in 4/21 showed EF 35-40% with mild AI.  CPX in 5/21 showed severe COPD, mild-moderate HF limitation.   She returns for followup of CHF.  She is still smoking a couple of cigarettes/day. Still gets short of breath with moderate exertion.  Mildly dyspneic walking to the mailbox. Dyspnea with vacuuming.  About 2 wks ago, she had an episode of band-like tightness across her chest lasting all day long, then resolved. She did not seek medical care.  She has had a couple of other episodes of non-exertional but mild chest pain since then, but nothing prolonged.  Weight is stable.  BP remains soft but no lightheadedness.  She reports fatigue about 4-5 hours after she takes dapagliflozin.  Legs fatigue easily, but she denies claudication.    Labs (7/18): LDL 61 Labs (11/18): K 4.2, creatinine 0.7, hgb 13.7 Labs (1/19): K 4.3, creatinine 1.07 Labs (2/19): K 4.5, creatinine 1.14 Labs  (7/19): K 4.4, creatinine 1.04, LDL 61, digoxin 1.2 Labs (9/19): digoxin 0.7, K 4.8, creatinine 0.93 Labs (1/20): K 4.3, creatinine 1.04 Labs (12/20): LDL 63, HDL 87, K 4.7, creatinine 1.01 Labs (5/21): LDL 65, HDL 80, K 4.9, creatinine 0.91 Labs (10/21): K 4.4, creatinine 1.1  [name] device interrogation: HeartLogic score 0.   PMH: 1. VSD: s/p repair at [facility] as child.  From description, sounds like muscular VSD.  2. Atrial tachycardia s/p ablation.  3. Hyperlipidemia 4. COPD: Active smoker.  - PFTs (12/18): Severe obstructive lung disease.  5. Atrial fibrillation: Paroxysmal.   Noted only transiently.   6. PAD: ABIs 11/18 with 0.62 on right, 0.72 on left.  - Angiogram 11/18 showed totally occluded bilateral CFAs.  Patient had stent to right CFA.  ABIs in 12/18 were normal on right. - Peripheral arterial dopplers (7/19) with significant in-stent restenosis right CFA.   - ABIs (10/20): ABI 0.8 right, 0.74 left 7. Chronic systolic CHF: Nonischemic cardiomyopathy.   - cMRI (11/09): EF 36%, VSD patch in anterior ventricular septum - Echo (12/15): EF 40-45% - Echo (3/18): EF 15-20%, moderate LV dilation, moderate MR, moderate RV dilation with moderately decreased systolic function.  - LHC/RHC (10/18): 3+ MR, EF < 25%, minimal nonobstructive CAD; PA 41/21, LVEDP 18, CI 1.33.  - CPX (1/19): peak VO2 13.4, VE/VCO2 slope 43, RER 1.07.  Mod-severe functional limitation due to combination of HF and lung disease, probably more related to lung disease.  -  [name] CRT-D device.  - Echo (7/19): EF 25-30%, mild LV dilation, mild MR, normal RV size and systolic function.  - Echo (4/21): EF 35-40%, mild AI - CPX (5/21): severe COPD, mild-moderate HF limitation.  8. PVCs: Zio patch 9/19 with 6% PVCs.   SH: Prior smoker, lives in [place], married.  FH: Mother with PAD, sister died at birth from congenital heart disease.   ROS: All systems reviewed and negative except as per  HPI.   Current Outpatient Medications  Medication Sig Dispense Refill  . albuterol (VENTOLIN HFA) 108 (90 Base) MCG/ACT inhaler Inhale 2 puffs into the lungs every 6 (six) hours as needed for wheezing or shortness of breath. 8.5 g 5  . ALPRAZolam (XANAX) 0.25 MG tablet TAKE 1 TABLET BY MOUTH TWICE PER DAY AS NEEDED 60 tablet 5  . aspirin 81 MG tablet Take 1 tablet (81 mg total) by mouth daily. 100 tablet 99  . carvedilol (COREG) 3.125 MG tablet Take 1 tablet (3.125 mg total) by mouth 2 (two) times daily with a meal. 180 tablet 3  . chlorpheniramine-HYDROcodone (TUSSIONEX) 10-8 MG/5ML SUER TAKE 1 TEASPOONFUL (5ML) BY MOUTH EVERY 12 HOURS AS NEEDED FOR COUGH 115 mL 0  . dapagliflozin propanediol (FARXIGA) 10 MG TABS tablet Take 1 tablet (10 mg total) by mouth daily before breakfast. 30 tablet 6  . digoxin (LANOXIN) 0.125 MG tablet Take 0.5 tablets (62.5 mcg total) by mouth daily. 45 tablet 3  . ENTRESTO 24-26 MG TAKE 1 TABLET BY MOUTH TWICE A DAY 180 tablet 3  . furosemide (LASIX) 20 MG tablet Take 20 mg by mouth daily. As needed    . ibuprofen (ADVIL,MOTRIN) 200 MG tablet Take 200 mg by mouth daily as needed for headache.    . rosuvastatin (CRESTOR) 10 MG tablet TAKE 1 TABLET BY MOUTH DAILY 90 tablet 3  . spironolactone (ALDACTONE) 25 MG tablet TAKE ONE TABLET BY MOUTH AT BEDTIME 90 tablet 3   No current facility-administered medications for this encounter.   BP (!) 90/50   Pulse 61   Wt 49.4 kg (109 lb)   SpO2 98%   BMI 20.60 kg/m  General: NAD Neck: No JVD, no thyromegaly or thyroid nodule.  Lungs: Distant breath sounds.  CV: Nondisplaced PMI.  Heart regular S1/S2, no S3/S4, no murmur.  No peripheral edema.  No carotid bruit. Unable to palpate pedal pulses.  Abdomen: Soft, nontender, no hepatosplenomegaly, no distention.  Skin: Intact without lesions or rashes.  Neurologic: Alert and oriented x 3.  Psych: Normal affect. Extremities: No clubbing or cyanosis.  HEENT: Normal.    Assessment/Plan: 1.  PAD: She had PCI to occluded right CFA in 11/18, but 7/19 peripheral arterial dopplers showed significant in-stent restenosis in the right CFA. She has occluded left CFA also that was not intervened on. She denies claudication or pedal ulcerations. She has started smoking again (1-2 cigs/day).  - Medical management in absence significant symptoms.  - I will arrange for repeat ABIs.  - Continue statin.  - Needs to quit smoking => she has failed Wellbutrin in the past, recommended that she try nicotine patches.  2. CAD: Nonobstructive on 10/18 cath. She had a prolonged episode of chest pain about 2 wks ago and has had some mild atypical chest pain since then.  - She is on ASA 81 and statin.  - I will arrange for Lexiscan Cardiolite to assess for ischemia.  3. Hyperlipidemia: Good lipids in 5/21.  Continue Crestor.   4.  COPD: Severe by PFTs on 5/21 CPX. COPD plays a significant role in her dyspnea. She has started back to smoking a couple cigarettes/day.  - Needs to quit smoking completely, as above, recommend nicotine patch.  6. Chronic systolic CHF: Nonischemic cardiomyopathy.  RHC/LHC in 10/18 showed no nonobstructive coronary disease but CI was very low at 1.33.  Her low cardiac output was out of proportion to her symptoms.  [name] CRT-D device.  CPX in 5/21 showed severe COPD but only mild-moderate HF limitation.  Most recent echo in 4/21 showed EF 35-40%.  On exam and by HeartLogic score, she is not volume overloaded. NYHA class II-III symptoms, likely due mostly to COPD.   - Continue Entresto 24/26 bid. BP too soft to increase.  - Continue Coreg 3.125 mg bid.  BP too soft to increase.    - Continue spironolactone 25 mg daily, take in evening as this helps to limit orthostatic symptoms. BMET today. - Continue digoxin, check level today.  - Continue Farxiga 10 mg daily, but given symptoms of fatigue several hours after taking it, I asked her to take it at night.    Followup in 3 months.   Loralie Champagne 11/27/2019

## 2019-11-28 ENCOUNTER — Encounter (HOSPITAL_COMMUNITY): Payer: Medicare Other

## 2019-11-29 ENCOUNTER — Other Ambulatory Visit (HOSPITAL_COMMUNITY): Payer: Self-pay | Admitting: Cardiology

## 2019-11-29 NOTE — Addendum Note (Signed)
Encounter addended by: Scarlette Calico, RN on: 11/29/2019 2:08 PM  Actions taken: Order list changed, Diagnosis association updated

## 2019-12-02 ENCOUNTER — Ambulatory Visit: Payer: Medicare Other | Admitting: Internal Medicine

## 2019-12-05 ENCOUNTER — Encounter: Payer: Self-pay | Admitting: Internal Medicine

## 2019-12-05 ENCOUNTER — Ambulatory Visit (INDEPENDENT_AMBULATORY_CARE_PROVIDER_SITE_OTHER): Payer: Medicare Other | Admitting: Internal Medicine

## 2019-12-05 ENCOUNTER — Other Ambulatory Visit: Payer: Self-pay

## 2019-12-05 VITALS — BP 110/60 | HR 59 | Temp 98.0°F | Ht 61.0 in | Wt 106.0 lb

## 2019-12-05 DIAGNOSIS — R739 Hyperglycemia, unspecified: Secondary | ICD-10-CM

## 2019-12-05 DIAGNOSIS — E785 Hyperlipidemia, unspecified: Secondary | ICD-10-CM

## 2019-12-05 DIAGNOSIS — I10 Essential (primary) hypertension: Secondary | ICD-10-CM | POA: Diagnosis not present

## 2019-12-05 DIAGNOSIS — F32A Depression, unspecified: Secondary | ICD-10-CM

## 2019-12-05 DIAGNOSIS — F17210 Nicotine dependence, cigarettes, uncomplicated: Secondary | ICD-10-CM | POA: Diagnosis not present

## 2019-12-05 MED ORDER — HYDROCOD POLST-CPM POLST ER 10-8 MG/5ML PO SUER: ORAL | 0 refills | Status: DC

## 2019-12-05 NOTE — Progress Notes (Addendum)
Subjective:    Patient ID: Kimberly [name], female    DOB: 1945/01/28, 74 y.o.   MRN: [id number]  HPI  Here to f/u; overall doing ok,  Pt denies chest pain, increasing sob or doe, wheezing, orthopnea, PND, increased LE swelling, palpitations, dizziness or syncope.  Pt denies new neurological symptoms such as new headache, or facial or extremity weakness or numbness.  Pt denies polydipsia, polyuria, or low sugar episode.  Pt states overall good compliance with meds, mostly trying to follow appropriate diet, with wt overall stable,  but little exercise however.  No new complaints  Denies worsening depressive symptoms, suicidal ideation, or panic.  Restarted smoking, not willing to quit Past Medical History:  Diagnosis Date  . AICD (automatic cardioverter/defibrillator) present 03/17/2017  . Anxiety   . Atrial tachycardia ([place])   . Bursitis of shoulder, right, adhesive   . CHF (congestive heart failure) ([place])   . Chronic bronchitis ([place])    "1-2 times/yr" (01/23/2014)  . COPD (chronic obstructive pulmonary disease) ([place])   . CVD (cerebrovascular disease)   . Dyslipidemia   . Dysrhythmia   . Frequency of urination   . GERD (gastroesophageal reflux disease)   . Heart murmur   . History of stomach ulcers   . HTN (hypertension) 02/22/2011  . Migraines    "stopped many years ago" (06/14/2014)  . Osteoporosis 08/19/2016  . Pericarditis   . Pneumonia "10 times" (06/14/2014)  . 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 Silent myocardial infarction ([place]) "late 1990's"  . Stress incontinence    "was suppose to have been tacked up years ago but I didn't do it"  . Syncope, near    Associated with atrial tachycardia-event recorder 1/16  . Thoracic outlet syndrome   . VSD (ventricular septal defect)    Past Surgical History:  Procedure Laterality Date  . ABDOMINAL AORTOGRAM W/LOWER EXTREMITY N/A 12/17/2016   Procedure: ABDOMINAL AORTOGRAM W/LOWER EXTREMITY;   Surgeon: Wellington Hampshire, MD;  Location: [facility];  Service: Cardiovascular;  Laterality: N/A;  . BIV ICD INSERTION CRT-D N/A 03/17/2017   Procedure: BIV ICD INSERTION CRT-D;  Surgeon: Evans Lance, MD;  Location: [facility];  Service: Cardiovascular;  Laterality: N/A;  . CARDIAC CATHETERIZATION  "quite a few"  . [place]  . CHOLECYSTECTOMY OPEN  1970's  . CORONARY ANGIOGRAM  2000   No significant CAD  . ELECTROPHYSIOLOGIC STUDY N/A 06/14/2014   Procedure: A-Flutter/A-Tach/SVT Ablation;  Surgeon: Evans Lance, MD;  Location: [facility];  Service: Cardiovascular;  Laterality: N/A;  . INSERTION OF ICD  03/17/2017   BIV  . MYRINGOTOMY WITH TUBE PLACEMENT Right 2015  . PERIPHERAL VASCULAR INTERVENTION  12/17/2016   Procedure: PERIPHERAL VASCULAR INTERVENTION;  Surgeon: Wellington Hampshire, MD;  Location: [facility];  Service: Cardiovascular;;  Right common femoral PTA and Stent  . RIGHT/LEFT HEART CATH AND CORONARY ANGIOGRAPHY Bilateral 11/06/2016   Procedure: RIGHT/LEFT HEART CATH AND CORONARY ANGIOGRAPHY;  Surgeon: Wellington Hampshire, MD;  Location: [facility];  Service: Cardiovascular;  Laterality: Bilateral;  . SVT ABLATION  06/14/2014  . TUBAL LIGATION  1972  . VSD REPAIR  1958; 1967    reports that she has been smoking cigarettes. She has a 11.55 pack-year smoking history. She has never used smokeless tobacco. She reports current alcohol use of about 1.0 standard drink of alcohol per week. She reports that she does  not use drugs. family history includes Alcohol abuse in an other family member; Arthritis in some other family members; Cancer in her father, mother, and another family member; Hypertension in some other family members; Stroke in some other family members; Throat cancer in an other family member. Allergies  Allergen Reactions  . Mupirocin Other (See Comments)    Burning, pain, swelling and sob  . Codeine Nausea Only    Current Outpatient Medications on File Prior to Visit  Medication Sig Dispense Refill  . albuterol (VENTOLIN HFA) 108 (90 Base) MCG/ACT inhaler Inhale 2 puffs into the lungs every 6 (six) hours as needed for wheezing or shortness of breath. 8.5 g 5  . ALPRAZolam (XANAX) 0.25 MG tablet TAKE 1 TABLET BY MOUTH TWICE PER DAY AS NEEDED 60 tablet 5  . aspirin 81 MG tablet Take 1 tablet (81 mg total) by mouth daily. 100 tablet 99  . carvedilol (COREG) 3.125 MG tablet Take 1 tablet (3.125 mg total) by mouth 2 (two) times daily with a meal. 180 tablet 3  . dapagliflozin propanediol (FARXIGA) 10 MG TABS tablet Take 1 tablet (10 mg total) by mouth daily before breakfast. 30 tablet 6  . digoxin (LANOXIN) 0.125 MG tablet Take 0.5 tablets (62.5 mcg total) by mouth daily. 45 tablet 3  . ENTRESTO 24-26 MG TAKE 1 TABLET BY MOUTH TWICE A DAY 180 tablet 3  . furosemide (LASIX) 20 MG tablet Take 20 mg by mouth daily. As needed    . ibuprofen (ADVIL,MOTRIN) 200 MG tablet Take 200 mg by mouth daily as needed for headache.    . rosuvastatin (CRESTOR) 10 MG tablet TAKE 1 TABLET BY MOUTH DAILY 90 tablet 3  . spironolactone (ALDACTONE) 25 MG tablet TAKE ONE TABLET BY MOUTH AT BEDTIME 90 tablet 3   No current facility-administered medications on file prior to visit.   Review of Systems All otherwise neg per pt    Objective:   Physical Exam BP 110/60 (BP Location: Left Arm, Patient Position: Sitting, Cuff Size: Large)   Pulse (!) 59   Temp 98 F (36.7 C) (Oral)   Ht 5\' 1"  (1.549 m)   Wt 106 lb (48.1 kg)   SpO2 94%   BMI 20.03 kg/m  VS noted,  Constitutional: Pt appears in NAD HENT: Head: NCAT.  Right Ear: External ear normal.  Left Ear: External ear normal.  Eyes: . Pupils are equal, round, and reactive to light. Conjunctivae and EOM are normal Nose: without d/c or deformity Neck: Neck supple. Gross normal ROM Cardiovascular: Normal rate and regular rhythm.   Pulmonary/Chest: Effort normal and breath  sounds without rales or wheezing.  Abd:  Soft, NT, ND, + BS, no organomegaly Neurological: Pt is alert. At baseline orientation, motor grossly intact Skin: Skin is warm. No rashes, other new lesions, no LE edema Psychiatric: Pt behavior is normal without agitation  All otherwise neg per pt  Lab Results  Component Value Date   HGBA1C 6.1 05/30/2019   Lab Results  Component Value Date   WBC 6.5 05/30/2019   HGB 13.0 05/30/2019   HCT 38.6 05/30/2019   PLT 229.0 05/30/2019   GLUCOSE 118 (H) 11/25/2019   CHOL 155 05/30/2019   TRIG 52.0 05/30/2019   HDL 79.80 05/30/2019   LDLDIRECT 112.6 03/16/2013   LDLCALC 65 05/30/2019   ALT 11 05/30/2019   AST 17 05/30/2019   NA 138 11/25/2019   K 4.3 11/25/2019   CL 101 11/25/2019   CREATININE 1.01 (H)  11/25/2019   BUN 18 11/25/2019   CO2 27 11/25/2019   TSH 0.99 05/30/2019   INR 1.02 12/15/2016   HGBA1C 6.1 05/30/2019       Assessment & Plan:

## 2019-12-05 NOTE — Patient Instructions (Signed)
Please continue all other medications as before, and refills have been done if requested - the cough medicine  Please have the pharmacy call with any other refills you may need.  Please continue your efforts at being more active, low cholesterol diet, and weight control.  Please keep your appointments with your specialists as you may have planned  Please stop smoking  Please make an Appointment to return in 6 months, or sooner if needed

## 2019-12-06 ENCOUNTER — Ambulatory Visit (HOSPITAL_COMMUNITY): Payer: Medicare Other | Attending: Cardiology

## 2019-12-06 DIAGNOSIS — R079 Chest pain, unspecified: Secondary | ICD-10-CM | POA: Diagnosis not present

## 2019-12-06 LAB — MYOCARDIAL PERFUSION IMAGING
LV dias vol: 75 mL (ref 46–106)
LV sys vol: 40 mL
Peak HR: 95 {beats}/min
Rest HR: 60 {beats}/min
SDS: 2
SRS: 10
SSS: 13
TID: 1.12

## 2019-12-06 MED ORDER — TECHNETIUM TC 99M TETROFOSMIN IV KIT
32.3000 | PACK | Freq: Once | INTRAVENOUS | Status: AC | PRN
  Administered 2019-12-06: 32.3 via INTRAVENOUS
  Filled 2019-12-06: qty 33

## 2019-12-06 MED ORDER — TECHNETIUM TC 99M TETROFOSMIN IV KIT
10.4000 | PACK | Freq: Once | INTRAVENOUS | Status: AC | PRN
  Administered 2019-12-06: 10.4 via INTRAVENOUS
  Filled 2019-12-06: qty 11

## 2019-12-06 MED ORDER — REGADENOSON 0.4 MG/5ML IV SOLN
0.4000 mg | Freq: Once | INTRAVENOUS | Status: AC
  Administered 2019-12-06: 0.4 mg via INTRAVENOUS

## 2019-12-09 ENCOUNTER — Encounter (HOSPITAL_COMMUNITY): Payer: Self-pay

## 2019-12-11 ENCOUNTER — Encounter: Payer: Self-pay | Admitting: Internal Medicine

## 2019-12-11 MED ORDER — ALBUTEROL SULFATE HFA 108 (90 BASE) MCG/ACT IN AERS: 2.0000 | INHALATION_SPRAY | Freq: Four times a day (QID) | RESPIRATORY_TRACT | 5 refills | Status: DC | PRN

## 2019-12-11 NOTE — Assessment & Plan Note (Signed)
stable overall by history and exam, recent data reviewed with pt, and pt to continue medical treatment as before,  to f/u any worsening symptoms or concerns  

## 2019-12-11 NOTE — Addendum Note (Signed)
Addended by: Biagio Borg on: 12/11/2019 08:50 PM   Modules accepted: Orders

## 2019-12-11 NOTE — Assessment & Plan Note (Addendum)

## 2019-12-11 NOTE — Assessment & Plan Note (Signed)
Counseled to quit 

## 2019-12-12 ENCOUNTER — Telehealth (HOSPITAL_COMMUNITY): Payer: Self-pay

## 2019-12-12 NOTE — Telephone Encounter (Signed)
-----   Message from Larey Dresser, MD sent at 12/06/2019 11:35 AM EST ----- Fixed defect but no ischemia.  ?Prior MI.  If no further chest pain, no indication for cath at this time.  If she continues to have CP episodes, probably need to investigate as prior cath showed no obstructive coronary disease and now there is possible prior MI on Cardiolite.

## 2019-12-12 NOTE — Telephone Encounter (Signed)
Patient advised and verbalized understanding. Patient states she did have a prior MI several years ago. She also wanted you to know that she stopped taking the [place] due to having several side effects. Med list updated.

## 2019-12-14 ENCOUNTER — Ambulatory Visit: Payer: Medicare Other | Admitting: Internal Medicine

## 2019-12-19 ENCOUNTER — Ambulatory Visit (INDEPENDENT_AMBULATORY_CARE_PROVIDER_SITE_OTHER): Payer: Medicare Other

## 2019-12-19 DIAGNOSIS — I420 Dilated cardiomyopathy: Secondary | ICD-10-CM

## 2019-12-19 DIAGNOSIS — I5022 Chronic systolic (congestive) heart failure: Secondary | ICD-10-CM | POA: Diagnosis not present

## 2019-12-21 LAB — CUP PACEART REMOTE DEVICE CHECK
Battery Remaining Longevity: 102 mo
Battery Remaining Percentage: 100 %
Brady Statistic RA Percent Paced: 50 %
Brady Statistic RV Percent Paced: 96 %
Date Time Interrogation Session: 20211130111300
HighPow Impedance: 79 Ohm
Implantable Lead Implant Date: 20190226
Implantable Lead Implant Date: 20190226
Implantable Lead Implant Date: 20190226
Implantable Lead Location: 753858
Implantable Lead Location: 753859
Implantable Lead Location: 753860
Implantable Lead Model: 292
Implantable Lead Model: 4671
Implantable Lead Model: 7740
Implantable Lead Serial Number: 444536
Implantable Lead Serial Number: 726363
Implantable Lead Serial Number: 805938
Implantable Pulse Generator Implant Date: 20190226
Lead Channel Impedance Value: 453 Ohm
Lead Channel Impedance Value: 552 Ohm
Lead Channel Impedance Value: 823 Ohm
Lead Channel Setting Pacing Amplitude: 2 V
Lead Channel Setting Pacing Amplitude: 2.5 V
Lead Channel Setting Pacing Amplitude: 2.6 V
Lead Channel Setting Pacing Pulse Width: 0.4 ms
Lead Channel Setting Pacing Pulse Width: 0.4 ms
Lead Channel Setting Sensing Sensitivity: 0.5 mV
Lead Channel Setting Sensing Sensitivity: 1 mV
Pulse Gen Serial Number: 204176

## 2019-12-23 NOTE — Progress Notes (Signed)
Remote ICD transmission.   

## 2019-12-26 ENCOUNTER — Other Ambulatory Visit: Payer: Self-pay | Admitting: Cardiovascular Disease

## 2019-12-26 ENCOUNTER — Telehealth (HOSPITAL_COMMUNITY): Payer: Self-pay | Admitting: Licensed Clinical Social Worker

## 2019-12-26 DIAGNOSIS — I739 Peripheral vascular disease, unspecified: Secondary | ICD-10-CM

## 2019-12-26 NOTE — Telephone Encounter (Signed)
CSW received call from pt inquiring about next steps with [employer] application she received in the mail.  CSW explained she would need to fill out patient portion then bring or fax to our office so we can complete the physician portion and get it turned back in.  Pt expressed understanding and will try to fax in by tomorrow  Will continue to follow and assist as needed  Jorge Ny, [facility] Desk#: [phone] Cell#: [phone]

## 2020-01-09 ENCOUNTER — Ambulatory Visit (INDEPENDENT_AMBULATORY_CARE_PROVIDER_SITE_OTHER): Payer: Medicare Other

## 2020-01-09 ENCOUNTER — Other Ambulatory Visit: Payer: Self-pay

## 2020-01-09 DIAGNOSIS — I739 Peripheral vascular disease, unspecified: Secondary | ICD-10-CM | POA: Diagnosis not present

## 2020-01-10 ENCOUNTER — Telehealth (HOSPITAL_COMMUNITY): Payer: Self-pay | Admitting: Pharmacy Technician

## 2020-01-10 NOTE — Telephone Encounter (Signed)
Spoke to patient regarding re-enrollment of Entresto assistance with [employer]. The patient sent in her portion of the application to [employer] already. I will get the provider to sign and send that portion in as well.  Will follow up.

## 2020-01-11 ENCOUNTER — Telehealth (HOSPITAL_COMMUNITY): Payer: Self-pay

## 2020-01-11 MED ORDER — SPIRONOLACTONE 25 MG PO TABS
12.5000 mg | ORAL_TABLET | Freq: Every day | ORAL | 0 refills | Status: DC
Start: 2020-01-11 — End: 2020-06-13

## 2020-01-11 NOTE — Telephone Encounter (Signed)
Patient advised and verbalized understanding. Med list updated to reflect changes.  ? ?

## 2020-01-11 NOTE — Telephone Encounter (Signed)
-----   Message from Larey Dresser, MD sent at 01/10/2020  7:30 PM EST ----- Systolic BP seems to run in the 90s chronically.  If she has a lot of orthostatic symptoms, she can decrease spironolactone to 12.5 mg qhs.

## 2020-01-23 ENCOUNTER — Encounter (HOSPITAL_COMMUNITY): Payer: Self-pay

## 2020-01-23 NOTE — Progress Notes (Signed)
Medication Samples have been provided to the patient.  Drug name: Sherryll Burger       Strength: 24/26 mg        Qty: 4  LOT: [id number]  Exp.Date: 5/23 3/23  Dosing instructions: Take 1 tablet 2 x a day  The patient has been instructed regarding the correct time, dose, and frequency of taking this medication, including desired effects and most common side effects.   Smitty Cords Cledis Sohn 3:56 PM 01/23/2020

## 2020-01-23 NOTE — Telephone Encounter (Signed)
Patient called in requesting samples of Entresto while she waits for her re-enrollment to be approved. Marchelle Folks, RN is going to have those ready for her.

## 2020-01-25 ENCOUNTER — Ambulatory Visit: Payer: Medicare Other | Admitting: Internal Medicine

## 2020-01-25 ENCOUNTER — Other Ambulatory Visit: Payer: Self-pay

## 2020-01-25 ENCOUNTER — Encounter: Payer: Self-pay | Admitting: Internal Medicine

## 2020-01-25 DIAGNOSIS — J449 Chronic obstructive pulmonary disease, unspecified: Secondary | ICD-10-CM | POA: Diagnosis not present

## 2020-01-25 DIAGNOSIS — F17210 Nicotine dependence, cigarettes, uncomplicated: Secondary | ICD-10-CM | POA: Diagnosis not present

## 2020-01-25 MED ORDER — BREZTRI AEROSPHERE 160-9-4.8 MCG/ACT IN AERO: 2.0000 | INHALATION_SPRAY | Freq: Two times a day (BID) | RESPIRATORY_TRACT | 0 refills | Status: DC

## 2020-01-25 NOTE — Progress Notes (Signed)
Kimberly [name], female    DOB: 1945/11/23     MRN: [id number]   Brief patient profile:  46 yowf active smoker  GOLD III  referred to pulmonary clinic 09/05/2019 by Dr  Shirlee Latch for sob     History of Present Illness  09/05/2019  Pulmonary/ 1st office eval/Ettore Trebilcock  - no maint rx  Chief Complaint  Patient presents with  . Follow-up    pt needs help to quit smoking.pt is wearing patches .pt can'y take chantix  Dyspnea:  Does [employer] s maint rx but struggles  Cough: typical for her takes 4-5 coughs with mucoid mucus and then  good to go  Sleep: sleeps bed flat/ one pillow on side  SABA use: one  5-6 x daily Rec Plan A = Automatic = Always=    Breztri Take 2 puffs first thing in am and then another 2 puffs about 12 hours later.  Work on inhaler technique: Plan B = Backup (to supplement plan A, not to replace it) Only use your albuterol inhaler as a rescue medication Prednisone 10 mg take  4 each am x 2 days,   2 each am x 2 days,  1 each am x 2 days and stop  The key is to stop smoking completely before smoking completely stops you! Add: needs alpha one AT screen next ov   01/25/2020  f/u ov/Kimberly [name] re: needs alpha one testing/ GOLD III copd / smoking  Chief Complaint  Patient presents with  . Follow-up    Breathing is "ok"- she feels that the [place] helped but she ran out after 2 samples and copay to high. She is using her albuterol inhaler 3-4 x per wk.   Dyspnea:  Pushing cart at [facility] for an aisle or two is all she can do - better while on breztri Cough: minimal mucoid in am  Sleeping: bed is flat/ one big pillow SABA use: as above  02: none    No obvious day to day or daytime variability or assoc excess/ purulent sputum or mucus plugs or hemoptysis or cp or chest tightness, subjective wheeze or overt sinus or hb symptoms.   Sleeping as above without nocturnal  or early am exacerbation  of respiratory  c/o's or need for noct saba. Also denies any obvious fluctuation of symptoms  with weather or environmental changes or other aggravating or alleviating factors except as outlined above   No unusual exposure hx or h/o childhood pna/ asthma or knowledge of premature birth.  Current Allergies, Complete Past Medical History, Past Surgical History, Family History, and Social History were reviewed in [employer] record.  ROS  The following are not active complaints unless bolded Hoarseness, sore throat, dysphagia, dental problems, itching, sneezing,  nasal congestion or discharge of excess mucus or purulent secretions, ear ache,   fever, chills, sweats, unintended wt loss or wt gain, classically pleuritic or exertional cp,  orthopnea pnd or arm/hand swelling  or leg swelling, presyncope, palpitations, abdominal pain, anorexia, nausea, vomiting, diarrhea  or change in bowel habits or change in bladder habits, change in stools or change in urine, dysuria, hematuria,  rash, arthralgias, visual complaints, headache, numbness, weakness or ataxia or problems with walking or coordination,  change in mood or  memory.          Past Medical History:  Diagnosis Date  . AICD (automatic cardioverter/defibrillator) present 03/17/2017  . Anxiety   . Atrial tachycardia (HCC)   . Bursitis of  shoulder, right, adhesive   . CHF (congestive heart failure) ([place])   . Chronic bronchitis ([place])    "1-2 times/yr" (01/23/2014)  . COPD (chronic obstructive pulmonary disease) ([place])   . CVD (cerebrovascular disease)   . Dyslipidemia   . Dysrhythmia   . Frequency of urination   . GERD (gastroesophageal reflux disease)   . Heart murmur   . History of stomach ulcers   . HTN (hypertension) 02/22/2011  . Migraines    "stopped many years ago" (06/14/2014)  . Osteoporosis 08/19/2016  . Pericarditis   . Pneumonia "10 times" (06/14/2014)  . 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 Silent myocardial infarction ([place]) "late 1990's"  . Stress  incontinence    "was suppose to have been tacked up years ago but I didn't do it"  . Syncope, near    Associated with atrial tachycardia-event recorder 1/16  . Thoracic outlet syndrome   . VSD (ventricular septal defect)        Objective:     Wt Readings from Last 3 Encounters:  01/25/20 105 lb (47.6 kg)  12/06/19 109 lb (49.4 kg)  12/05/19 106 lb (48.1 kg)      Vital signs reviewed  01/25/2020  - Note at rest 02 sats  95% on RA   General appearance:    Chronically ill appearing elderly amb wf nad with typical smoker's rattle       HEENT : pt wearing mask not removed for exam due to covid - 19 concerns.    NECK :  without JVD/Nodes/TM/ nl carotid upstrokes bilaterally   LUNGS: no acc muscle use,  Mild barrel  contour chest wall with bilateral  Distant bs s audible wheeze and  without cough on insp or exp maneuvers  and mild  Hyperresonant  to  percussion bilaterally     CV:  RRR  no s3 or murmur or increase in P2, and no edema   ABD:  soft and nontender with pos end  insp Hoover's  in the supine position. No bruits or organomegaly appreciated, bowel sounds nl  MS:   Nl gait/  ext warm without deformities, calf tenderness, cyanosis or clubbing No obvious joint restrictions   SKIN: warm and dry without lesions    NEURO:  alert, approp, nl sensorium with  no motor or cerebellar deficits apparent.          Assessment     Outpatient Encounter Medications as of 01/25/2020  Medication Sig  . Budeson-Glycopyrrol-Formoterol (BREZTRI AEROSPHERE) 160-9-4.8 MCG/ACT AERO Inhale 2 puffs into the lungs 2 (two) times daily.  Marland Kitchen albuterol (VENTOLIN HFA) 108 (90 Base) MCG/ACT inhaler Inhale 2 puffs into the lungs every 6 (six) hours as needed for wheezing or shortness of breath.  . ALPRAZolam (XANAX) 0.25 MG tablet TAKE 1 TABLET BY MOUTH TWICE PER DAY AS NEEDED  . aspirin 81 MG tablet Take 1 tablet (81 mg total) by mouth daily.  . carvedilol (COREG) 3.125 MG tablet Take 1 tablet  (3.125 mg total) by mouth 2 (two) times daily with a meal.  . chlorpheniramine-HYDROcodone (TUSSIONEX) 10-8 MG/5ML SUER TAKE 1 TEASPOONFUL (5ML) BY MOUTH EVERY 12 HOURS AS NEEDED FOR COUGH  . digoxin (LANOXIN) 0.125 MG tablet Take 0.5 tablets (62.5 mcg total) by mouth daily.  Marland Kitchen ENTRESTO 24-26 MG TAKE 1 TABLET BY MOUTH TWICE A DAY  . furosemide (LASIX) 20 MG tablet Take 20 mg by mouth daily. As needed  . ibuprofen (ADVIL,MOTRIN) 200 MG  tablet Take 200 mg by mouth daily as needed for headache.  . rosuvastatin (CRESTOR) 10 MG tablet TAKE 1 TABLET BY MOUTH DAILY  . spironolactone (ALDACTONE) 25 MG tablet Take 0.5 tablets (12.5 mg total) by mouth at bedtime.   No facility-administered encounter medications on file as of 01/25/2020.        Marland Kitchen

## 2020-01-25 NOTE — Assessment & Plan Note (Addendum)
Active smoker - PFT's  01/12/17   FEV1 0.88 (44 % ) ratio 0.51  p 16 % improvement from saba p ? prior to study with DLCO  9.68 (47%) corrects to 3.47 (78%)  for alv volume and FV curve slt atypical concave pattern  - 09/05/2019    try breztri  - 01/25/2020  After extensive coaching inhaler device,  effectiveness =  50% (short Ti)  - Labs ordered 01/25/2020  :    alpha one AT phenotype     Group D in terms of symptom/risk and laba/lama/ICS  therefore appropriate rx at this point >>>  breztri one bid and prn saba  Re saba: I spent extra time with pt today reviewing appropriate use of albuterol for prn use on exertion with the following points: 1) saba is for relief of sob that does not improve by walking a slower pace or resting but rather if the pt does not improve after trying this first. 2) If the pt is convinced, as many are, that saba helps recover from activity faster then it's easy to tell if this is the case by re-challenging : ie stop, take the inhaler, then p 5 minutes try the exact same activity (intensity of workload) that just caused the symptoms and see if they are substantially diminished or not after saba 3) if there is an activity that reproducibly causes the symptoms, try the saba 15 min before the activity on alternate days   If in fact the saba really does help, then fine to continue to use it prn but advised may need to look closer at the maintenance regimen being used to achieve better control of airways disease with exertion.   Re formulary/ costs: Advised:  formulary restrictions will be an ongoing challenge for the forseable future and I would be happy to pick an alternative if the pt will first  provide me a list of them -  pt  will need to return here for training for any new device that is required eg dpi vs hfa vs respimat.    In the meantime we can always provide samples so that the patient never runs out of any needed respiratory medications.          Each maintenance  medication was reviewed in detail including emphasizing most importantly the difference between maintenance and prns and under what circumstances the prns are to be triggered using an action plan format where appropriate.  Total time for H and P, chart review, counseling, teaching device and generating customized AVS unique to this office visit / charting = > 30 min

## 2020-01-25 NOTE — Assessment & Plan Note (Addendum)
4-5 min discussion re active cigarette smoking in addition to office E&M  Ask about tobacco use:   ongoing Advise quitting   I took an extended  opportunity with this patient to outline the consequences of continued cigarette use  in airway disorders based on all the data we have from the multiple national lung health studies (perfomed over decades at millions of dollars in cost)  indicating that smoking cessation, not choice of inhalers or physicians, is the most important aspect of care.   Assess willingness:  N? Fully committed - has patches but "they make her shaky"  Advised to read the instructions and match the number of cigs she uses against the rating and make a one for one substitution, not to use both patches and cigs. Assist in quit attempt:  As above  Arrange follow up:   Follow up per Primary Care planned

## 2020-01-25 NOTE — Patient Instructions (Addendum)
Plan A = Automatic = Always=    Breztri Take 2 puffs first thing in am and then another 2 puffs about 12 hours later  Plan B = Backup (to supplement plan A, not to replace it) Only use your albuterol inhaler as a rescue medication to be used if you can't catch your breath by resting or doing a relaxed purse lip breathing pattern.  - The less you use it, the better it will work when you need it. - Ok to use the inhaler up to 2 puffs  every 4 hours if you must but call for appointment if use goes up over your usual need - Don't leave home without it !!  (think of it like the spare tire for your car)   Please remember to go to the lab department   for your tests - we will call you with the results when they are available.     I will call the drug rep for breztri to see what happened with your application > informed he is not able to do this due to company policy / HIPPA laws   Please schedule a follow up visit in 3 months but call sooner if needed

## 2020-01-27 ENCOUNTER — Ambulatory Visit: Payer: Medicare Other | Admitting: Cardiovascular Disease

## 2020-01-28 ENCOUNTER — Encounter: Payer: Self-pay | Admitting: Internal Medicine

## 2020-01-31 LAB — ALPHA-1 ANTITRYPSIN PHENOTYPE: A-1 Antitrypsin, Ser: 134 mg/dL (ref 83–199)

## 2020-02-01 ENCOUNTER — Encounter: Payer: Self-pay | Admitting: *Deleted

## 2020-02-13 ENCOUNTER — Encounter: Payer: Self-pay | Admitting: Internal Medicine

## 2020-02-13 ENCOUNTER — Other Ambulatory Visit: Payer: Self-pay | Admitting: Internal Medicine

## 2020-02-13 MED ORDER — ALPRAZOLAM 0.25 MG PO TABS: 0.2500 mg | ORAL_TABLET | Freq: Every evening | ORAL | 2 refills | Status: DC | PRN

## 2020-02-14 ENCOUNTER — Other Ambulatory Visit: Payer: Self-pay | Admitting: Internal Medicine

## 2020-02-14 MED ORDER — BREZTRI AEROSPHERE 160-9-4.8 MCG/ACT IN AERO: 2.0000 | INHALATION_SPRAY | Freq: Two times a day (BID) | RESPIRATORY_TRACT | 3 refills | Status: DC

## 2020-02-15 NOTE — Telephone Encounter (Signed)
Patient wondering why her medication was changed from 1 tablet by month 2 times daily as needed to now its 1 tablet at bed time. She is worried because most days she only needs 1 or 1 and a half but she doesn't want to get in a situation where she needs a refill and she cant get it because she has used too much.

## 2020-02-16 MED ORDER — ALPRAZOLAM 0.25 MG PO TABS
0.2500 mg | ORAL_TABLET | Freq: Two times a day (BID) | ORAL | 2 refills | Status: DC | PRN
Start: 2020-02-16 — End: 2020-02-21

## 2020-02-16 NOTE — Addendum Note (Signed)
Addended by: Biagio Borg on: 02/16/2020 01:26 PM   Modules accepted: Orders

## 2020-02-21 ENCOUNTER — Ambulatory Visit (INDEPENDENT_AMBULATORY_CARE_PROVIDER_SITE_OTHER): Payer: Medicare Other | Admitting: Cardiovascular Disease

## 2020-02-21 ENCOUNTER — Other Ambulatory Visit: Payer: Self-pay

## 2020-02-21 ENCOUNTER — Encounter: Payer: Self-pay | Admitting: Cardiovascular Disease

## 2020-02-21 VITALS — BP 100/60 | HR 63 | Ht 61.0 in | Wt 109.1 lb

## 2020-02-21 DIAGNOSIS — E785 Hyperlipidemia, unspecified: Secondary | ICD-10-CM | POA: Diagnosis not present

## 2020-02-21 DIAGNOSIS — Z720 Tobacco use: Secondary | ICD-10-CM | POA: Diagnosis not present

## 2020-02-21 DIAGNOSIS — I739 Peripheral vascular disease, unspecified: Secondary | ICD-10-CM

## 2020-02-21 DIAGNOSIS — I5022 Chronic systolic (congestive) heart failure: Secondary | ICD-10-CM

## 2020-02-21 NOTE — Progress Notes (Signed)
Cardiology Office Note   Date:  02/21/2020   ID:  Kimberly, [name] June 29, 1945, MRN [id number]  PCP:  Kimberly Borg, MD  Cardiologist: Dr. Harrington [name]  Chief Complaint  Patient presents with  . Follow-up    Lower extremity arterial results. Medications reviewed by the patient verbally. "doing well."       History of Present Illness: Kimberly [name] is a 75 y.o. female who is here today for a follow-up visit regarding peripheral arterial disease.   She is followed by me for peripheral arterial disease and followed closely at the heart failure clinic.  She has history of congenital heart disease status post VSD repair with multiple prior catheterizations via bilateral femoral arteries when she was a child, SVT status post ablation, hyperlipidemia, COPD, tobacco use, transient atrial fibrillation and chronic systolic heart failure. Patient was seen in 2018 for peripheral arterial disease with nonhealing wound on the right lateral leg above the ankle. She underwent recent noninvasive vascular evaluation which showed a right ABI of 0.62 and left of 0.72. Angiography in 2018 showed moderate left common iliac artery stenosis, short occlusion of right common femoral artery with no significant infrainguinal disease and short occlusion of the left common femoral artery with no significant infrainguinal disease.  I performed successful drug-coated balloon angioplasty and self-expanding stent placement to the right common femoral artery without complications.  She had no recurrent wounds since then. She developed severe restenosis in the right common femoral artery but remained asymptomatic and thus has been monitored closely.  Recent Doppler studies showed significant restenosis just proximal to the right common femoral artery stent.  Velocities were higher than last year but in spite of that, she continues to be asymptomatic.  She has no claudication or lower extremity ulceration.  No chest pain.  She  reports stable exertional dyspnea.   Past Medical History:  Diagnosis Date  . AICD (automatic cardioverter/defibrillator) present 03/17/2017  . Anxiety   . Atrial tachycardia ([place])   . Bursitis of shoulder, right, adhesive   . CHF (congestive heart failure) ([place])   . Chronic bronchitis ([place])    "1-2 times/yr" (01/23/2014)  . COPD (chronic obstructive pulmonary disease) ([place])   . CVD (cerebrovascular disease)   . Dyslipidemia   . Dysrhythmia   . Frequency of urination   . GERD (gastroesophageal reflux disease)   . Heart murmur   . History of stomach ulcers   . HTN (hypertension) 02/22/2011  . Migraines    "stopped many years ago" (06/14/2014)  . Osteoporosis 08/19/2016  . Pericarditis   . Pneumonia "10 times" (06/14/2014)  . 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 Silent myocardial infarction ([place]) "late 1990's"  . Stress incontinence    "was suppose to have been tacked up years ago but I didn't do it"  . Syncope, near    Associated with atrial tachycardia-event recorder 1/16  . Thoracic outlet syndrome   . VSD (ventricular septal defect)     Past Surgical History:  Procedure Laterality Date  . ABDOMINAL AORTOGRAM W/LOWER EXTREMITY N/A 12/17/2016   Procedure: ABDOMINAL AORTOGRAM W/LOWER EXTREMITY;  Surgeon: Kimberly Hampshire, MD;  Location: [facility];  Service: Cardiovascular;  Laterality: N/A;  . BIV ICD INSERTION CRT-D N/A 03/17/2017   Procedure: BIV ICD INSERTION CRT-D;  Surgeon: Kimberly Lance, MD;  Location: [facility];  Service: Cardiovascular;  Laterality: N/A;  . CARDIAC CATHETERIZATION  "quite a few"  .  [place]  . CHOLECYSTECTOMY OPEN  1970's  . CORONARY ANGIOGRAM  2000   No significant CAD  . ELECTROPHYSIOLOGIC STUDY N/A 06/14/2014   Procedure: A-Flutter/A-Tach/SVT Ablation;  Surgeon: Kimberly Lance, MD;  Location: [facility];  Service: Cardiovascular;  Laterality: N/A;  . INSERTION OF ICD   03/17/2017   BIV  . MYRINGOTOMY WITH TUBE PLACEMENT Right 2015  . PERIPHERAL VASCULAR INTERVENTION  12/17/2016   Procedure: PERIPHERAL VASCULAR INTERVENTION;  Surgeon: Kimberly Hampshire, MD;  Location: [facility];  Service: Cardiovascular;;  Right common femoral PTA and Stent  . RIGHT/LEFT HEART CATH AND CORONARY ANGIOGRAPHY Bilateral 11/06/2016   Procedure: RIGHT/LEFT HEART CATH AND CORONARY ANGIOGRAPHY;  Surgeon: Kimberly Hampshire, MD;  Location: [facility];  Service: Cardiovascular;  Laterality: Bilateral;  . SVT ABLATION  06/14/2014  . TUBAL LIGATION  1972  . VSD REPAIR  1958; 1967     Current Outpatient Medications  Medication Sig Dispense Refill  . albuterol (VENTOLIN HFA) 108 (90 Base) MCG/ACT inhaler Inhale 2 puffs into the lungs every 6 (six) hours as needed for wheezing or shortness of breath. 8.5 g 5  . ALPRAZolam (XANAX) 0.25 MG tablet TAKE 1 TABLET BY MOUTH TWICE PER DAY AS NEEDED 60 tablet 5  . aspirin 81 MG tablet Take 1 tablet (81 mg total) by mouth daily. 100 tablet 99  . Budeson-Glycopyrrol-Formoterol (BREZTRI AEROSPHERE) 160-9-4.8 MCG/ACT AERO Inhale 2 puffs into the lungs 2 (two) times daily. 5.9 g 0  . carvedilol (COREG) 3.125 MG tablet Take 1 tablet (3.125 mg total) by mouth 2 (two) times daily with a meal. 180 tablet 3  . digoxin (LANOXIN) 0.125 MG tablet Take 0.5 tablets (62.5 mcg total) by mouth daily. 45 tablet 3  . ENTRESTO 24-26 MG TAKE 1 TABLET BY MOUTH TWICE A DAY 180 tablet 3  . furosemide (LASIX) 20 MG tablet Take 20 mg by mouth daily as needed.    Marland Kitchen ibuprofen (ADVIL,MOTRIN) 200 MG tablet Take 200 mg by mouth daily as needed for headache.    . rosuvastatin (CRESTOR) 10 MG tablet TAKE 1 TABLET BY MOUTH DAILY 90 tablet 3  . spironolactone (ALDACTONE) 25 MG tablet Take 0.5 tablets (12.5 mg total) by mouth at bedtime. 45 tablet 0   No current facility-administered medications for this visit.    Allergies:   Mupirocin and Codeine    Social  History:  The patient  reports that she has been smoking cigarettes. She has a 11.55 pack-year smoking history. She has never used smokeless tobacco. She reports current alcohol use of about 1.0 standard drink of alcohol per week. She reports that she does not use drugs.   Family History:  The patient's family history includes Alcohol abuse in an other family member; Arthritis in some other family members; Cancer in her father, mother, and another family member; Hypertension in some other family members; Stroke in some other family members; Throat cancer in an other family member.    ROS:  Please see the history of present illness.   Otherwise, review of systems are positive for none.   All other systems are reviewed and negative.    PHYSICAL EXAM: VS:  BP 100/60 (BP Location: Left Arm, Patient Position: Sitting, Cuff Size: Normal)   Pulse 63   Ht 5\' 1"  (1.549 m)   Wt 109 lb 2 oz (49.5 kg)   BMI 20.62 kg/m  , BMI Body mass index is 20.62 kg/m. GEN:  Well nourished, well developed, in no acute distress  HEENT: normal  Neck: no JVD, carotid bruits, or masses Cardiac: RRR; no  rubs, or gallops,no edema.  3 out of 6 holosystolic murmur at the left sternal border and apex. Respiratory:  clear to auscultation bilaterally, normal work of breathing GI: soft, nontender, nondistended, + BS MS: no deformity or atrophy  Skin: warm and dry, no rash Neuro:  Strength and sensation are intact Psych: euthymic mood, full affect Vascular: Radial pulse is absent bilaterally.  Distal pedal pulses are faint but palpable on the right side and not palpable on the left.   EKG:  EKG is ordered today. The ekg ordered today demonstrates dual-chamber biventricular pacemaker.   Recent Labs: 05/30/2019: ALT 11; Hemoglobin 13.0; Platelets 229.0; TSH 0.99 11/25/2019: BUN 18; Creatinine, Ser 1.01; Potassium 4.3; Sodium 138    Lipid Panel    Component Value Date/Time   CHOL 155 05/30/2019 1458   TRIG 52.0  05/30/2019 1458   HDL 79.80 05/30/2019 1458   CHOLHDL 2 05/30/2019 1458   VLDL 10.4 05/30/2019 1458   LDLCALC 65 05/30/2019 1458   LDLDIRECT 112.6 03/16/2013 1057      Wt Readings from Last 3 Encounters:  02/21/20 109 lb 2 oz (49.5 kg)  01/25/20 105 lb (47.6 kg)  12/06/19 109 lb (49.4 kg)       No flowsheet data found.    ASSESSMENT AND PLAN:  1.  Peripheral arterial disease .  Status post endovascular revascularization of the right common femoral artery with worsening proximal restenosis.  In spite of that, she continues to be asymptomatic.  There is a possibility that the stent might occlude completely but I suspect that she has very well developed collaterals given prolonged history of common femoral artery occlusion in the past.  In addition, her left common femoral artery is occluded and she has no symptoms there.  2.  Chronic systolic heart failure: Due to nonischemic cardiomyopathy.  She appears to be euvolemic and currently on optimal medical therapy followed at the heart failure clinic.  3.  Hyperlipidemia: Continue treatment with rosuvastatin with a target LDL of less than 70.  Most recent lipid profile showed an LDL of 65.  4.  Tobacco use: She quit smoking but then relapsed and now she is quitting again.  I discussed with her the importance of complete abstinence.    Disposition:   FU with me in 6 month  Signed,  Kathlyn Sacramento, MD  02/21/2020 3:51 PM    [facility]

## 2020-02-21 NOTE — Patient Instructions (Signed)

## 2020-02-23 NOTE — Addendum Note (Signed)
Addended by: Ernie Hew D on: 02/23/2020 04:59 PM   Modules accepted: Orders

## 2020-02-27 ENCOUNTER — Encounter: Payer: Medicare Other | Admitting: Internal Medicine

## 2020-03-02 ENCOUNTER — Telehealth: Payer: Self-pay | Admitting: Internal Medicine

## 2020-03-02 ENCOUNTER — Encounter (HOSPITAL_COMMUNITY): Payer: Self-pay | Admitting: Cardiology

## 2020-03-02 ENCOUNTER — Ambulatory Visit (HOSPITAL_COMMUNITY)
Admission: RE | Admit: 2020-03-02 | Discharge: 2020-03-02 | Disposition: A | Discharge status: Home / Self Care | Payer: Medicare Other | Source: Ambulatory Visit | Attending: Cardiology | Admitting: Cardiology

## 2020-03-02 ENCOUNTER — Other Ambulatory Visit: Payer: Self-pay

## 2020-03-02 VITALS — BP 110/60 | HR 72 | Wt 107.6 lb

## 2020-03-02 DIAGNOSIS — I428 Other cardiomyopathies: Secondary | ICD-10-CM | POA: Diagnosis not present

## 2020-03-02 DIAGNOSIS — Z720 Tobacco use: Secondary | ICD-10-CM

## 2020-03-02 DIAGNOSIS — J449 Chronic obstructive pulmonary disease, unspecified: Secondary | ICD-10-CM | POA: Insufficient documentation

## 2020-03-02 DIAGNOSIS — I480 Paroxysmal atrial fibrillation: Secondary | ICD-10-CM | POA: Insufficient documentation

## 2020-03-02 DIAGNOSIS — E785 Hyperlipidemia, unspecified: Secondary | ICD-10-CM | POA: Diagnosis not present

## 2020-03-02 DIAGNOSIS — Z7982 Long term (current) use of aspirin: Secondary | ICD-10-CM | POA: Diagnosis not present

## 2020-03-02 DIAGNOSIS — F17210 Nicotine dependence, cigarettes, uncomplicated: Secondary | ICD-10-CM | POA: Diagnosis not present

## 2020-03-02 DIAGNOSIS — I5022 Chronic systolic (congestive) heart failure: Secondary | ICD-10-CM | POA: Diagnosis not present

## 2020-03-02 DIAGNOSIS — I739 Peripheral vascular disease, unspecified: Secondary | ICD-10-CM | POA: Diagnosis not present

## 2020-03-02 DIAGNOSIS — Z79899 Other long term (current) drug therapy: Secondary | ICD-10-CM | POA: Diagnosis not present

## 2020-03-02 DIAGNOSIS — I2510 Atherosclerotic heart disease of native coronary artery without angina pectoris: Secondary | ICD-10-CM | POA: Diagnosis not present

## 2020-03-02 LAB — BASIC METABOLIC PANEL
Anion gap: 10 (ref 5–15)
BUN: 15 mg/dL (ref 8–23)
CO2: 27 mmol/L (ref 22–32)
Calcium: 9.3 mg/dL (ref 8.9–10.3)
Chloride: 103 mmol/L (ref 98–111)
Creatinine, Ser: 0.87 mg/dL (ref 0.44–1.00)
GFR, Estimated: >60 mL/min (ref >60)
Glucose, Bld: 90 mg/dL (ref 70–99)
Potassium: 4.3 mmol/L (ref 3.5–5.1)
Sodium: 140 mmol/L (ref 135–145)

## 2020-03-02 LAB — LIPID PANEL
Cholesterol: 173 mg/dL (ref 0–200)
HDL: 99 mg/dL (ref >40)
LDL Cholesterol: 61 mg/dL (ref 0–99)
Total CHOL/HDL Ratio: 1.7 RATIO
Triglycerides: 64 mg/dL (ref <150)
VLDL: 13 mg/dL (ref 0–40)

## 2020-03-02 LAB — DIGOXIN LEVEL: Digoxin Level: 0.5 ng/mL — ABNORMAL LOW (ref 0.8–2.0)

## 2020-03-02 MED ORDER — CARVEDILOL 6.25 MG PO TABS: 6.2500 mg | ORAL_TABLET | Freq: Two times a day (BID) | ORAL | 3 refills | Status: DC

## 2020-03-02 MED ORDER — BREZTRI AEROSPHERE 160-9-4.8 MCG/ACT IN AERO: 2.0000 | INHALATION_SPRAY | Freq: Two times a day (BID) | RESPIRATORY_TRACT | 0 refills | Status: DC

## 2020-03-02 NOTE — Telephone Encounter (Signed)
Call returned to patient, confirmed DOB. Made aware samples have been placed up front. Given number for patient assistance program to check status of her application. Voiced understanding. Aware to call back if they need anything further.   Nothing further needed at this time.

## 2020-03-02 NOTE — Telephone Encounter (Signed)
Patient was seen in office today and stated that she was still receiving Farxiga from [facility] despite not being on the medication anymore. Called [employer] to report that the patient was no longer on the medication. They will discontinue her from the program.   Called [employer] to check the status of the patient's application. Representative stated that they are still working on processing enrollment applications. She does not show the patient's portion at this time. She was willing to go ahead and place a refill for the patient while working on the re-enrollment application. ETA on delivery of Entresto, Thursday 02/17.  Got her to re-sign the application, sent to [employer] via fax.

## 2020-03-02 NOTE — Progress Notes (Signed)
Medication Samples have been provided to the patient.  Drug name: Delene Loll       Strength: 24-26mg         Qty: 1  LOT: [id number]  Exp.Date: 12/2021  Dosing instructions: take 1 tablet po bid  The patient has been instructed regarding the correct time, dose, and frequency of taking this medication, including desired effects and most common side effects.   Kailene Steinhart R Charonda Hefter 59:09 AM 03/02/2020

## 2020-03-02 NOTE — Patient Instructions (Addendum)
Labs done today. We will contact you only if your labs are abnormal.  INCREASE Carvedilol to 6.25mg  (1 tablet) by mouth 2 times daily.  We had provided you with samples of Entresto. Please Contact [employer] so they can send you a transition refill. Their number is [phone].   No other medication changes were made. Please continue all current medications as prescribed.  Your physician recommends that you schedule a follow-up appointment in: 3 months  If you have any questions or concerns before your next appointment please send [place] a message through [place] or call our office at [phone].    TO LEAVE A MESSAGE FOR THE NURSE SELECT OPTION 2, PLEASE LEAVE A MESSAGE INCLUDING: . YOUR NAME . DATE OF BIRTH . CALL BACK NUMBER . REASON FOR CALL**this is important as we prioritize the call backs  YOU WILL RECEIVE A CALL BACK THE SAME DAY AS LONG AS YOU CALL BEFORE 4:00 PM   Do the following things EVERYDAY: 1) Weigh yourself in the morning before breakfast. Write it down and keep it in a log. 2) Take your medicines as prescribed 3) Eat low salt foods-Limit salt (sodium) to 2000 mg per day.  4) Stay as active as you can everyday 5) Limit all fluids for the day to less than 2 liters   At the [facility], you and your health needs are our priority. As part of our continuing mission to provide you with exceptional heart care, we have created designated Provider Care Teams. These Care Teams include your primary Cardiologist (physician) and Advanced Practice Providers (APPs- Physician Assistants and Nurse Practitioners) who all work together to provide you with the care you need, when you need it.   You may see any of the following providers on your designated Care Team at your next follow up: Marland Kitchen Dr Glori Bickers . Dr Loralie Champagne . Darrick Grinder, NP . Lyda Jester, PA . Audry Riles, PharmD   Please be sure to bring in all your medications bottles to every  appointment.

## 2020-03-03 NOTE — Progress Notes (Signed)
PCP: Dr. Jenny Reichmann Cardiology: Dr. Harrington Challenger HF Cardiology: Dr. Aundra Dubin  75 y.o.with history of VSD repair as a child, COPD/active smoking, PAD, and chronic systolic CHF was referred by Dr. Harrington Challenger for evaluation of CHF.   Patient has a long history of cardiomyopathy.  She had VSD repaired as a child and imaging has not showed residual VSD.  Cardiac MRI in 11/09 showed EF 36%.  Echo in 12/15 showed EF 40-45%.  Echo in 3/18 showed EF down to 15-20% with moderate RV systolic dysfunction.  RHC/LHC in 10/18 showed nonobstructive coronary but was concerning for low output (CI 1.33).  She had a right lower leg slowly healing ulcer.  She had peripheral arteriogram with bilateral occluded CFAs. She had PCI to right CFA.    CPX in 1/19 showed moderate to severe functional limitation, combination of HF and lung disease, probably more related to the lung disease.  PFTs in 12/18 showed severe COPD.   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    7/19 peripheral arterial dopplers showed significant in-stent restenosis right SFA stent.  Echo in 4/21 showed EF 35-40% with mild AI.  CPX in 5/21 showed severe COPD, mild-moderate HF limitation.  11/21 Cardiolite showed prior septal and apical infarction, no ischemia.   She returns for followup of CHF.  She is still smoking a couple of cigarettes/day. She had to stop [place], says it made her "feel sluggish."  Breathing stable, dyspnea only with long distances walking.  No chest pain.  No orthopnea/PND.  Rare using Lasix.  Weight down 2 lbs.  Rare right leg claudication.  Try to walk more for exercise.  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     Labs (7/18): LDL 61 Labs (11/18): K 4.2, creatinine 0.7, hgb 13.7 Labs (1/19): K 4.3, creatinine 1.07 Labs (2/19): K 4.5, creatinine 1.14 Labs (7/19): K 4.4, creatinine 1.04, LDL 61, digoxin 1.2 Labs (9/19): digoxin 0.7, K 4.8, creatinine 0.93 Labs (1/20): K 4.3, creatinine 1.04 Labs (12/20): LDL 63, HDL 87, K 4.7, creatinine  1.01 Labs (5/21): LDL 65, HDL 80, K 4.9, creatinine 0.91 Labs (10/21): K 4.4, creatinine 1.1 Labs (11/21): digoxin 0.3, K 4.3, creatinine 1.0  [name] device interrogation: HeartLogic score 7.   PMH: 1. VSD: s/p repair at [facility] as child.  From description, sounds like muscular VSD.  2. Atrial tachycardia s/p ablation.  3. Hyperlipidemia 4. COPD: Active smoker.  - PFTs (12/18): Severe obstructive lung disease.  5. Atrial fibrillation: Paroxysmal.   Noted only transiently.   6. PAD: ABIs 11/18 with 0.62 on right, 0.72 on left.  - Angiogram 11/18 showed totally occluded bilateral CFAs.  Patient had stent to right CFA.  ABIs in 12/18 were normal on right. - Peripheral arterial dopplers (7/19) with significant in-stent restenosis right CFA.   - ABIs (10/20): ABI 0.8 right, 0.74 left - 12/21 dopplers with R CFA stent in-stenosis.  7. Chronic systolic CHF: Nonischemic cardiomyopathy.   - cMRI (11/09): EF 36%, VSD patch in anterior ventricular septum - Echo (12/15): EF 40-45% - Echo (3/18): EF 15-20%, moderate LV dilation, moderate MR, moderate RV dilation with moderately decreased systolic function.  - LHC/RHC (10/18): 3+ MR, EF < 25%, minimal nonobstructive CAD; PA 41/21, LVEDP 18, CI 1.33.  - CPX (1/19): peak VO2 13.4, VE/VCO2 slope 43, RER 1.07.  Mod-severe functional limitation due to combination of HF and lung disease, probably more related to lung disease.  - [name] CRT-D device.  - Echo (  7/19): EF 25-30%, mild LV dilation, mild MR, normal RV size and systolic function.  - Echo (4/21): EF 35-40%, mild AI - CPX (5/21): severe COPD, mild-moderate HF limitation.  - Cardiolite (11/21): EF 47%, prior septal and apical infarction, no ischemia. 8. PVCs: Zio patch 9/19 with 6% PVCs.   SH: Prior smoker, lives in [place], married.  FH: Mother with PAD, sister died at birth from congenital heart disease.   ROS: All systems reviewed and negative except as per HPI.    Current Outpatient Medications  Medication Sig Dispense Refill  . albuterol (VENTOLIN HFA) 108 (90 Base) MCG/ACT inhaler Inhale 2 puffs into the lungs every 6 (six) hours as needed for wheezing or shortness of breath. 8.5 g 5  . ALPRAZolam (XANAX) 0.25 MG tablet TAKE 1 TABLET BY MOUTH TWICE PER DAY AS NEEDED 60 tablet 5  . aspirin 81 MG tablet Take 1 tablet (81 mg total) by mouth daily. 100 tablet 99  . Budeson-Glycopyrrol-Formoterol (BREZTRI AEROSPHERE) 160-9-4.8 MCG/ACT AERO Inhale 2 puffs into the lungs 2 (two) times daily. 5.9 g 0  . digoxin (LANOXIN) 0.125 MG tablet Take 0.5 tablets (62.5 mcg total) by mouth daily. 45 tablet 3  . ENTRESTO 24-26 MG TAKE 1 TABLET BY MOUTH TWICE A DAY 180 tablet 3  . furosemide (LASIX) 20 MG tablet Take 20 mg by mouth daily as needed.    Marland Kitchen ibuprofen (ADVIL,MOTRIN) 200 MG tablet Take 200 mg by mouth daily as needed for headache.    . rosuvastatin (CRESTOR) 10 MG tablet TAKE 1 TABLET BY MOUTH DAILY 90 tablet 3  . spironolactone (ALDACTONE) 25 MG tablet Take 0.5 tablets (12.5 mg total) by mouth at bedtime. 45 tablet 0  . carvedilol (COREG) 6.25 MG tablet Take 1 tablet (6.25 mg total) by mouth 2 (two) times daily with a meal. 180 tablet 3   No current facility-administered medications for this encounter.   BP 110/60   Pulse 72   Wt 48.8 kg (107 lb 9.6 oz)   SpO2 96%   BMI 20.33 kg/m  General: NAD Neck: No JVD, no thyromegaly or thyroid nodule.  Lungs: Decreased breath sounds.  CV: Nondisplaced PMI.  Heart regular S1/S2, no S3/S4, no murmur.  No peripheral edema.  No carotid bruit.  Unable to palpate pedal pulses.  Abdomen: Soft, nontender, no hepatosplenomegaly, no distention.  Skin: Intact without lesions or rashes.  Neurologic: Alert and oriented x 3.  Psych: Normal affect. Extremities: No clubbing or cyanosis.  HEENT: Normal.   Assessment/Plan: 1.  PAD: She had PCI to occluded right CFA in 11/18, but 7/19 peripheral arterial dopplers and again  in 12/21 showed significant in-stent restenosis in the right CFA. She has occluded left CFA also that was not intervened on. Minimal claudication and no pedal ulcerations. She is still smoking (1-2 cigs/day).  - Medical management in absence significant symptoms.  - Continue statin.  - Needs to quit smoking => she has failed Wellbutrin in the past, recommended that she try nicotine patches.  2. CAD: Nonobstructive on 10/18 cath. She has atypical chest pain at times with stress.  Cardiolite in 11/21 showed no ischemia.  - She is on ASA 81 and statin.  3. Hyperlipidemia: Good lipids in 5/21.  Continue Crestor, check lipids today.   4. COPD: Severe by PFTs on 5/21 CPX. COPD plays a significant role in her dyspnea. She has started back to smoking a couple cigarettes/day.  - Needs to quit smoking completely, as above, recommend nicotine  patch.  6. Chronic systolic CHF: Nonischemic cardiomyopathy.  RHC/LHC in 10/18 showed no nonobstructive coronary disease but CI was very low at 1.33.  Her low cardiac output was out of proportion to her symptoms.  [name] CRT-D device.  CPX in 5/21 showed severe COPD but only mild-moderate HF limitation.  Most recent echo in 4/21 showed EF 35-40%.  On exam and by HeartLogic score, she is not volume overloaded. NYHA class II symptoms, likely due mostly to COPD.   - Continue Entresto 24/26 bid. BMET today.   - I will increase Coreg to 6.25 mg bid.     - Continue spironolactone 25 mg daily, take in evening as this helps to limit orthostatic symptoms.  - Continue digoxin, check level today.  - Unable to tolerate [place].   Followup in 3 months.   Loralie Champagne 03/03/2020

## 2020-03-07 NOTE — Progress Notes (Signed)
Cardiology Office Note Date:  03/12/2020  Patient ID:  Kimberly [name], Kimberly [name] [birth year], MRN [id number] PCP:  Biagio Borg, MD  Cardiologist: Dr. Aundra Dubin Vasc: Dr. Fletcher Anon Electrophysiologist: Dr. Lovena Le     Chief Complaint: annual visit  History of Present Illness: Kimberly [name] is a 75 y.o. female with history of congential heart disease with a VSD repair (as a child), SVT (ablated 06/14/2014), HLD, COPD, smoker, PAFib (this is described as transient), chronic CHF (bi-Ve, systolic), PVD (9323 drug-coated balloon angioplasty and self-expanding stent placement to the right common femoral artery), NOD by cath 2018.  CPX in 1/19 showed moderate to severe functional limitation, combination of HF and lung disease, probably more related to the lung disease.  PFTs in 12/18 showed severe COPD.   She comes today to be seen for Dr. Lovena Le, last seen by him 02/24/19, felt symptoms 2/2 COPD which is failrly significant, CHF symptoms controlled.  Mentioned non-cardiac CP, at this visit she had stopped smoking was using nicotine patches.  She saw Dr. Fletcher Anon 02/21/20, her PVD had progressed though remained asymptomatic, no changes were made, with suspect that she had developed collateral circulation with longstanding chronically occluded L common femoral artery.  She saw Dr. Aundra Dubin 03/02/20, still smoking, no CP, rare RLE symptoms, doing OK, planning to start a part time job, coreg was titrated.  She is ding well.  She will have some days that she feels a bit lghtheaded, no  Near syncope or syncope, no falls No CP, palpitations Has some baseline SOB with an occasional wheeze. She is working on smoking planned to start nicotine patches.  Device information BSCi CRT-D, implanted 03/17/2017   Past Medical History:  Diagnosis Date  . AICD (automatic cardioverter/defibrillator) present 03/17/2017  . Anxiety   . Atrial tachycardia ([place])   . Bursitis of shoulder, right, adhesive   . CHF (congestive heart  failure) ([name])   . Chronic bronchitis ([place])    "1-2 times/yr" (01/23/2014)  . COPD (chronic obstructive pulmonary disease) ([place])   . CVD (cerebrovascular disease)   . Dyslipidemia   . Dysrhythmia   . Frequency of urination   . GERD (gastroesophageal reflux disease)   . Heart murmur   . History of stomach ulcers   . HTN (hypertension) 02/22/2011  . Migraines    "stopped many years ago" (06/14/2014)  . Osteoporosis 08/19/2016  . Pericarditis   . Pneumonia "10 times" (06/14/2014)  . 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 Silent myocardial infarction ([place]) "late 1990's"  . Stress incontinence    "was suppose to have been tacked up years ago but I didn't do it"  . Syncope, near    Associated with atrial tachycardia-event recorder 1/16  . Thoracic outlet syndrome   . VSD (ventricular septal defect)     Past Surgical History:  Procedure Laterality Date  . ABDOMINAL AORTOGRAM W/LOWER EXTREMITY N/A 12/17/2016   Procedure: ABDOMINAL AORTOGRAM W/LOWER EXTREMITY;  Surgeon: Wellington Hampshire, MD;  Location: [facility];  Service: Cardiovascular;  Laterality: N/A;  . BIV ICD INSERTION CRT-D N/A 03/17/2017   Procedure: BIV ICD INSERTION CRT-D;  Surgeon: Evans Lance, MD;  Location: [facility];  Service: Cardiovascular;  Laterality: N/A;  . CARDIAC CATHETERIZATION  "quite a few"  . [place]  . CHOLECYSTECTOMY OPEN  1970's  . CORONARY ANGIOGRAM  2000   No significant CAD  . ELECTROPHYSIOLOGIC STUDY N/A 06/14/2014   Procedure: A-Flutter/A-Tach/SVT  Ablation;  Surgeon: Evans Lance, MD;  Location: [facility];  Service: Cardiovascular;  Laterality: N/A;  . INSERTION OF ICD  03/17/2017   BIV  . MYRINGOTOMY WITH TUBE PLACEMENT Right 2015  . PERIPHERAL VASCULAR INTERVENTION  12/17/2016   Procedure: PERIPHERAL VASCULAR INTERVENTION;  Surgeon: Wellington Hampshire, MD;  Location: [facility];  Service: Cardiovascular;;   Right common femoral PTA and Stent  . RIGHT/LEFT HEART CATH AND CORONARY ANGIOGRAPHY Bilateral 11/06/2016   Procedure: RIGHT/LEFT HEART CATH AND CORONARY ANGIOGRAPHY;  Surgeon: Wellington Hampshire, MD;  Location: [facility];  Service: Cardiovascular;  Laterality: Bilateral;  . SVT ABLATION  06/14/2014  . TUBAL LIGATION  1972  . VSD REPAIR  1958; 1967    Current Outpatient Medications  Medication Sig Dispense Refill  . albuterol (VENTOLIN HFA) 108 (90 Base) MCG/ACT inhaler Inhale 2 puffs into the lungs every 6 (six) hours as needed for wheezing or shortness of breath. 8.5 g 5  . ALPRAZolam (XANAX) 0.25 MG tablet TAKE 1 TABLET BY MOUTH TWICE PER DAY AS NEEDED 60 tablet 5  . aspirin 81 MG tablet Take 1 tablet (81 mg total) by mouth daily. 100 tablet 99  . Budeson-Glycopyrrol-Formoterol (BREZTRI AEROSPHERE) 160-9-4.8 MCG/ACT AERO Inhale 2 puffs into the lungs 2 (two) times daily. 5.9 g 0  . carvedilol (COREG) 12.5 MG tablet Take 12.5 mg by mouth 2 (two) times daily with a meal.    . digoxin (LANOXIN) 0.125 MG tablet Take 0.5 tablets (62.5 mcg total) by mouth daily. 45 tablet 3  . ENTRESTO 24-26 MG TAKE 1 TABLET BY MOUTH TWICE A DAY 180 tablet 3  . furosemide (LASIX) 20 MG tablet Take 20 mg by mouth daily as needed.    Marland Kitchen ibuprofen (ADVIL,MOTRIN) 200 MG tablet Take 200 mg by mouth daily as needed for headache.    . rosuvastatin (CRESTOR) 10 MG tablet TAKE 1 TABLET BY MOUTH DAILY 90 tablet 3  . spironolactone (ALDACTONE) 25 MG tablet Take 0.5 tablets (12.5 mg total) by mouth at bedtime. 45 tablet 0   No current facility-administered medications for this visit.    Allergies:   Mupirocin and Codeine   Social History:  The patient  reports that she has been smoking cigarettes. She has a 11.55 pack-year smoking history. She has never used smokeless tobacco. She reports current alcohol use of about 1.0 standard drink of alcohol per week. She reports that she does not use drugs.   Family  History:  The patient's family history includes Alcohol abuse in an other family member; Arthritis in some other family members; Cancer in her father, mother, and another family member; Hypertension in some other family members; Stroke in some other family members; Throat cancer in an other family member.  ROS:  Please see the history of present illness.    All other systems are reviewed and otherwise negative.   PHYSICAL EXAM:  VS:  BP (!) 94/50   Pulse 67   Ht 5\' 1"  (1.549 m)   Wt 108 lb 9.6 oz (49.3 kg)   SpO2 91%   BMI 20.52 kg/m  BMI: Body mass index is 20.52 kg/m. Well nourished, well developed, in no acute distress HEENT: normocephalic, atraumatic Neck: no JVD, carotid bruits or masses Cardiac:  RRR; no significant murmurs, no rubs, or gallops Lungs:  CTA b/l,  slight end exp wheeze b/l, rhonchi or rales, no crackles Abd: soft, nontender MS: no deformity, very thin, perhaps advanced atrophy  Ext:  no edema, marked spider veins b/l, chronic looking skin changes Skin: warm and dry, no rash Neuro:  No gross deficits appreciated Psych: euthymic mood, full affect  ICD site is stable, (R sided) no tethering or discomfort   EKG:  Not done today  Device interrogation done today and reviewed by myself:  Battery and lead measurements are ok NSVT episodes (2) are brief 1:1 BP 95%, noted to have a couple PVCs while on the programmer, not persdistently  05/18/2019 TTE IMPRESSIONS  1. Left ventricular ejection fraction, by estimation, is 35 to 40%. The  left ventricle has moderately decreased function. The left ventricle  demonstrates regional wall motion abnormalities (see scoring  diagram/findings for description). There is mild  left ventricular hypertrophy. Left ventricular diastolic parameters are  consistent with Grade I diastolic dysfunction (impaired relaxation).  2. Right ventricular systolic function was not well visualized. The right  ventricular size is not well  visualized. A AICD noted is visualized. There  is normal pulmonary artery systolic pressure.  3. The mitral valve is abnormal. Mild mitral valve regurgitation.  4. Tricuspid valve regurgitation is mild to moderate.  5. The aortic valve is tricuspid. Aortic valve regurgitation is not  visualized. Mild aortic valve sclerosis is present, with no evidence of  aortic valve stenosis.  6. The inferior vena cava is normal in size with greater than 50%  respiratory variability, suggesting right atrial pressure of 3 mmHg.   Comparison(s): No significant change from prior study.    06/14/2014: EPS/ablation CONCLUSIONS:  1. Sinus rhythm upon presentation.  2. The patient had dual AV nodal physiology with easily inducible classic AV nodal reentrant tachycardia, non-sustained. 3. Inducible atrial flutter 4. Spontaneous atrial fibrillation occuring during atrial flutter 3. Successful radiofrequency modification of the slow AV nodal pathway and the atrial flutter isthmus. 4. No inducible arrhythmias following ablation.  5. No early apparent complications.    Recent Labs: 05/30/2019: ALT 11; Hemoglobin 13.0; Platelets 229.0; TSH 0.99 03/02/2020: BUN 15; Creatinine, Ser 0.87; Potassium 4.3; Sodium 140  03/02/2020: Cholesterol 173; HDL 99; LDL Cholesterol 61; Total CHOL/HDL Ratio 1.7; Triglycerides 64; VLDL 13   Estimated Creatinine Clearance: 43.5 mL/min (by C-G formula based on SCr of 0.87 mg/dL).   Wt Readings from Last 3 Encounters:  03/12/20 108 lb 9.6 oz (49.3 kg)  03/02/20 107 lb 9.6 oz (48.8 kg)  02/21/20 109 lb 2 oz (49.5 kg)     Other studies reviewed: Additional studies/records reviewed today include: summarized above  ASSESSMENT AND PLAN:  1. ICD     Intact function, no programming changes made  2. NICM 3. Chronic CHF     No exam findings or symptoms to suggest volume OL     Heart logic score 10     Just saw Dr. Aundra Dubin, meds titrated    Disposition: F/u with Dr.  McLean/Dr, Fletcher Anon as directed by them, continue remotes as usual and in clinic with EP in 1 year, sooner if needed  Current medicines are reviewed at length with the patient today.  The patient did not have any concerns regarding medicines.  Venetia Night, PA-C 03/12/2020 10:00 AM     [address] [place] [address] [phone] (office)  [phone] (fax)

## 2020-03-12 ENCOUNTER — Encounter: Payer: Self-pay | Admitting: Physician Assistant

## 2020-03-12 ENCOUNTER — Ambulatory Visit (INDEPENDENT_AMBULATORY_CARE_PROVIDER_SITE_OTHER): Payer: Medicare Other | Admitting: Physician Assistant

## 2020-03-12 ENCOUNTER — Other Ambulatory Visit: Payer: Self-pay | Admitting: Cardiovascular Disease

## 2020-03-12 ENCOUNTER — Other Ambulatory Visit: Payer: Self-pay

## 2020-03-12 VITALS — BP 94/50 | HR 67 | Ht 61.0 in | Wt 108.6 lb

## 2020-03-12 DIAGNOSIS — I428 Other cardiomyopathies: Secondary | ICD-10-CM | POA: Diagnosis not present

## 2020-03-12 DIAGNOSIS — I5022 Chronic systolic (congestive) heart failure: Secondary | ICD-10-CM | POA: Diagnosis not present

## 2020-03-12 DIAGNOSIS — Z95810 Presence of automatic (implantable) cardiac defibrillator: Secondary | ICD-10-CM | POA: Diagnosis not present

## 2020-03-12 LAB — CUP PACEART INCLINIC DEVICE CHECK
Date Time Interrogation Session: 20220221160820
HighPow Impedance: 66 Ohm
Implantable Lead Implant Date: 20190226
Implantable Lead Implant Date: 20190226
Implantable Lead Implant Date: 20190226
Implantable Lead Location: 753858
Implantable Lead Location: 753859
Implantable Lead Location: 753860
Implantable Lead Model: 292
Implantable Lead Model: 4671
Implantable Lead Model: 7740
Implantable Lead Serial Number: 444536
Implantable Lead Serial Number: 726363
Implantable Lead Serial Number: 805938
Implantable Pulse Generator Implant Date: 20190226
Lead Channel Impedance Value: 438 Ohm
Lead Channel Impedance Value: 551 Ohm
Lead Channel Impedance Value: 816 Ohm
Lead Channel Pacing Threshold Amplitude: 0.6 V
Lead Channel Pacing Threshold Amplitude: 0.7 V
Lead Channel Pacing Threshold Amplitude: 1.4 V
Lead Channel Pacing Threshold Pulse Width: 0.4 ms
Lead Channel Pacing Threshold Pulse Width: 0.4 ms
Lead Channel Pacing Threshold Pulse Width: 0.4 ms
Lead Channel Sensing Intrinsic Amplitude: 17.8 mV
Lead Channel Sensing Intrinsic Amplitude: 2.9 mV
Lead Channel Sensing Intrinsic Amplitude: 20.3 mV
Lead Channel Setting Pacing Amplitude: 2 V
Lead Channel Setting Pacing Amplitude: 2.5 V
Lead Channel Setting Pacing Amplitude: 2.6 V
Lead Channel Setting Pacing Pulse Width: 0.4 ms
Lead Channel Setting Pacing Pulse Width: 0.4 ms
Lead Channel Setting Sensing Sensitivity: 0.5 mV
Lead Channel Setting Sensing Sensitivity: 1 mV
Pulse Gen Serial Number: 204176

## 2020-03-12 NOTE — Patient Instructions (Addendum)

## 2020-03-19 ENCOUNTER — Ambulatory Visit (INDEPENDENT_AMBULATORY_CARE_PROVIDER_SITE_OTHER): Payer: Medicare Other

## 2020-03-19 DIAGNOSIS — I5022 Chronic systolic (congestive) heart failure: Secondary | ICD-10-CM

## 2020-03-19 DIAGNOSIS — I428 Other cardiomyopathies: Secondary | ICD-10-CM

## 2020-03-21 LAB — CUP PACEART REMOTE DEVICE CHECK
Battery Remaining Longevity: 90 mo
Battery Remaining Percentage: 100 %
Brady Statistic RA Percent Paced: 43 %
Brady Statistic RV Percent Paced: 95 %
Date Time Interrogation Session: 20220301112800
HighPow Impedance: 76 Ohm
Implantable Lead Implant Date: 20190226
Implantable Lead Implant Date: 20190226
Implantable Lead Implant Date: 20190226
Implantable Lead Location: 753858
Implantable Lead Location: 753859
Implantable Lead Location: 753860
Implantable Lead Model: 292
Implantable Lead Model: 4671
Implantable Lead Model: 7740
Implantable Lead Serial Number: 444536
Implantable Lead Serial Number: 726363
Implantable Lead Serial Number: 805938
Implantable Pulse Generator Implant Date: 20190226
Lead Channel Impedance Value: 448 Ohm
Lead Channel Impedance Value: 552 Ohm
Lead Channel Impedance Value: 833 Ohm
Lead Channel Setting Pacing Amplitude: 2 V
Lead Channel Setting Pacing Amplitude: 2.5 V
Lead Channel Setting Pacing Amplitude: 2.6 V
Lead Channel Setting Pacing Pulse Width: 0.4 ms
Lead Channel Setting Pacing Pulse Width: 0.4 ms
Lead Channel Setting Sensing Sensitivity: 0.5 mV
Lead Channel Setting Sensing Sensitivity: 1 mV
Pulse Gen Serial Number: 204176

## 2020-03-23 NOTE — Telephone Encounter (Signed)
Called [employer] to check the status of the patient's application. Representative stated that they did not receive the updated patients portion of the application. Will refax.

## 2020-03-26 NOTE — Telephone Encounter (Signed)
Called [employer] to check the status of the patient's application. Representative stated that they have not yet received the document. Could take 3-5 business days.

## 2020-03-26 NOTE — Progress Notes (Signed)
Remote ICD transmission.   

## 2020-04-02 NOTE — Telephone Encounter (Signed)
Called [employer] to check the status of the patient's application. Representative stated that the application is under review.  Will follow up.

## 2020-04-16 ENCOUNTER — Telehealth (HOSPITAL_COMMUNITY): Payer: Self-pay | Admitting: Pharmacy Technician

## 2020-04-16 NOTE — Telephone Encounter (Signed)
Advanced Heart Failure Patient Advocate Encounter   Patient was approved to receive Entresto from [employer]  Patient ID: [id number] Effective dates: 04/09/20 through 01/19/21  Called and left the patient message.   Charlann Boxer, CPhT

## 2020-04-24 ENCOUNTER — Ambulatory Visit: Payer: Medicare Other | Admitting: Internal Medicine

## 2020-05-01 ENCOUNTER — Ambulatory Visit: Payer: Medicare Other | Admitting: Internal Medicine

## 2020-05-08 ENCOUNTER — Other Ambulatory Visit: Payer: Self-pay

## 2020-05-08 ENCOUNTER — Encounter: Payer: Self-pay | Admitting: Internal Medicine

## 2020-05-08 ENCOUNTER — Telehealth (INDEPENDENT_AMBULATORY_CARE_PROVIDER_SITE_OTHER): Payer: Medicare Other | Admitting: Internal Medicine

## 2020-05-08 DIAGNOSIS — J441 Chronic obstructive pulmonary disease with (acute) exacerbation: Secondary | ICD-10-CM

## 2020-05-08 MED ORDER — ALPRAZOLAM 0.5 MG PO TABS: ORAL_TABLET | ORAL | 2 refills | Status: DC

## 2020-05-08 MED ORDER — DOXYCYCLINE HYCLATE 100 MG PO TABS: 100.0000 mg | ORAL_TABLET | Freq: Two times a day (BID) | ORAL | 0 refills | Status: DC

## 2020-05-08 MED ORDER — HYDROCODONE-HOMATROPINE 5-1.5 MG/5ML PO SYRP: 5.0000 mL | ORAL_SOLUTION | Freq: Four times a day (QID) | ORAL | 0 refills | Status: AC | PRN

## 2020-05-08 MED ORDER — PREDNISONE 10 MG PO TABS: ORAL_TABLET | ORAL | 0 refills | Status: DC

## 2020-05-08 NOTE — Progress Notes (Signed)
Patient ID: Kimberly [name], female   DOB: [birth year], 75 y.o.   MRN: [id number]  Cumulative time during 7-day interval 11 min, there was not an associated office visit for this concern within a 7 day period.  Verbal consent for services obtained from patient prior to services given.  Names of all persons present for services: Cathlean Cower, MD, patient  Chief complaint: sinus infection and wheezing  History, background, results pertinent:   Here with 2-3 days acute onset fever, facial pain, pressure, headache, general weakness and malaise, and greenish d/c, with mild ST and cough and wheezing, sob, but pt denies chest pain, orthopnea, PND, increased LE swelling, palpitations, dizziness or syncope. Denies worsening depressive symptoms, suicidal ideation, or panic; has ongoing anxiety, increased recently with multiple stressors   Past Medical History:  Diagnosis Date  . AICD (automatic cardioverter/defibrillator) present 03/17/2017  . Anxiety   . Atrial tachycardia ([place])   . Bursitis of shoulder, right, adhesive   . CHF (congestive heart failure) ([place])   . Chronic bronchitis ([place])    "1-2 times/yr" (01/23/2014)  . COPD (chronic obstructive pulmonary disease) ([place])   . CVD (cerebrovascular disease)   . Dyslipidemia   . Dysrhythmia   . Frequency of urination   . GERD (gastroesophageal reflux disease)   . Heart murmur   . History of stomach ulcers   . HTN (hypertension) 02/22/2011  . Migraines    "stopped many years ago" (06/14/2014)  . Osteoporosis 08/19/2016  . Pericarditis   . Pneumonia "10 times" (06/14/2014)  . 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 Silent myocardial infarction ([place]) "late 1990's"  . Stress incontinence    "was suppose to have been tacked up years ago but I didn't do it"  . Syncope, near    Associated with atrial tachycardia-event recorder 1/16  . Thoracic outlet syndrome   . VSD (ventricular septal defect)    No results found for  this or any previous visit (from the past 17 hour(s)).\ Current Outpatient Medications on File Prior to Visit  Medication Sig Dispense Refill  . albuterol (VENTOLIN HFA) 108 (90 Base) MCG/ACT inhaler Inhale 2 puffs into the lungs every 6 (six) hours as needed for wheezing or shortness of breath. 8.5 g 5  . aspirin 81 MG tablet Take 1 tablet (81 mg total) by mouth daily. 100 tablet 99  . Budeson-Glycopyrrol-Formoterol (BREZTRI AEROSPHERE) 160-9-4.8 MCG/ACT AERO Inhale 2 puffs into the lungs 2 (two) times daily. 5.9 g 0  . carvedilol (COREG) 12.5 MG tablet Take 12.5 mg by mouth 2 (two) times daily with a meal.    . digoxin (LANOXIN) 0.125 MG tablet Take 0.5 tablets (62.5 mcg total) by mouth daily. 45 tablet 3  . ENTRESTO 24-26 MG TAKE 1 TABLET BY MOUTH TWICE A DAY 180 tablet 3  . furosemide (LASIX) 20 MG tablet Take 20 mg by mouth daily as needed.    Marland Kitchen ibuprofen (ADVIL,MOTRIN) 200 MG tablet Take 200 mg by mouth daily as needed for headache.    . rosuvastatin (CRESTOR) 10 MG tablet TAKE 1 TABLET BY MOUTH DAILY 90 tablet 1  . spironolactone (ALDACTONE) 25 MG tablet Take 0.5 tablets (12.5 mg total) by mouth at bedtime. 45 tablet 0   No current facility-administered medications on file prior to visit.   Lab Results  Component Value Date   WBC 6.5 05/30/2019   HGB 13.0 05/30/2019   HCT 38.6 05/30/2019   PLT 229.0 05/30/2019   GLUCOSE 90  03/02/2020   CHOL 173 03/02/2020   TRIG 64 03/02/2020   HDL 99 03/02/2020   LDLDIRECT 112.6 03/16/2013   LDLCALC 61 03/02/2020   ALT 11 05/30/2019   AST 17 05/30/2019   NA 140 03/02/2020   K 4.3 03/02/2020   CL 103 03/02/2020   CREATININE 0.87 03/02/2020   BUN 15 03/02/2020   CO2 27 03/02/2020   TSH 0.99 05/30/2019   INR 1.02 12/15/2016   HGBA1C 6.1 05/30/2019    A/P/next steps:   1) sinusitis with copd exac - for check home covid testing, antibx, cough med prn, and predpaac asd  2)  Anxiety - for xanax .5 bid prn  Cathlean Cower MD

## 2020-05-09 DIAGNOSIS — Z20822 Contact with and (suspected) exposure to covid-19: Secondary | ICD-10-CM | POA: Diagnosis not present

## 2020-05-12 ENCOUNTER — Telehealth: Payer: Self-pay | Admitting: Student

## 2020-05-12 ENCOUNTER — Encounter: Payer: Self-pay | Admitting: Internal Medicine

## 2020-05-12 DIAGNOSIS — U071 COVID-19: Secondary | ICD-10-CM

## 2020-05-12 NOTE — Telephone Encounter (Signed)
   Patient called After Hours line just to notify us that she has COVID. She started feeling back last week and had a virtual visit with her PCP on 05/08/2020. He prescribed Doxycyline and Prednisone given history of COPD and asked patient to get tested for COVID. She went to [facility] and just found out she tested positive. She has not had a fever since Tuesday. Breathing stable - not worse than usual. No chest pain. She was wondering how long her symptoms are going to last for. I told her that is hard to say as this is a viral illness but it sounds like she is slowly improving which is good. Advised her to notify her PCP (she has already sent him a Pharmacist, community message). Also discussed that if her breathing worsens or her O2 sat starts to drop, she should be evaluated this weekend in Urgent Care or ED. Patient voiced understanding and agreed.  Darreld Mclean, PA-C 05/12/2020 10:05 AM

## 2020-05-13 ENCOUNTER — Encounter: Payer: Self-pay | Admitting: Internal Medicine

## 2020-05-13 NOTE — Patient Instructions (Signed)
Please take all new medication as prescribed 

## 2020-05-14 MED ORDER — HYDROCOD POLST-CPM POLST ER 10-8 MG/5ML PO SUER: 5.0000 mL | Freq: Two times a day (BID) | ORAL | 0 refills | Status: DC | PRN

## 2020-05-14 NOTE — Telephone Encounter (Signed)
Patient called and said that she tested positive for Covid 19. She said that HYDROcodone-homatropine (HYCODAN) 5-1.5 MG/5ML syrup does not help with her cough. She was wondering if Tussionex could be sent into her pharmacy. She was also wondering if there was anything else that she needed to be doing or taking. Please advise    [place], [place]

## 2020-05-16 ENCOUNTER — Ambulatory Visit: Payer: Medicare Other | Admitting: Internal Medicine

## 2020-05-17 ENCOUNTER — Encounter: Payer: Self-pay | Admitting: Internal Medicine

## 2020-05-31 ENCOUNTER — Encounter: Payer: Self-pay | Admitting: Internal Medicine

## 2020-06-04 ENCOUNTER — Encounter (HOSPITAL_COMMUNITY): Payer: Medicare Other | Admitting: Cardiology

## 2020-06-11 ENCOUNTER — Other Ambulatory Visit: Payer: Self-pay

## 2020-06-11 ENCOUNTER — Ambulatory Visit: Payer: Medicare Other | Admitting: Internal Medicine

## 2020-06-11 ENCOUNTER — Ambulatory Visit (INDEPENDENT_AMBULATORY_CARE_PROVIDER_SITE_OTHER): Payer: Medicare Other

## 2020-06-11 ENCOUNTER — Encounter: Payer: Self-pay | Admitting: Internal Medicine

## 2020-06-11 ENCOUNTER — Ambulatory Visit (INDEPENDENT_AMBULATORY_CARE_PROVIDER_SITE_OTHER): Payer: Medicare Other | Admitting: Internal Medicine

## 2020-06-11 VITALS — BP 110/62 | HR 60 | Temp 98.6°F | Ht 61.0 in | Wt 104.0 lb

## 2020-06-11 DIAGNOSIS — R634 Abnormal weight loss: Secondary | ICD-10-CM

## 2020-06-11 DIAGNOSIS — I5022 Chronic systolic (congestive) heart failure: Secondary | ICD-10-CM

## 2020-06-11 DIAGNOSIS — J449 Chronic obstructive pulmonary disease, unspecified: Secondary | ICD-10-CM | POA: Diagnosis not present

## 2020-06-11 DIAGNOSIS — U099 Post covid-19 condition, unspecified: Secondary | ICD-10-CM

## 2020-06-11 DIAGNOSIS — I10 Essential (primary) hypertension: Secondary | ICD-10-CM | POA: Diagnosis not present

## 2020-06-11 DIAGNOSIS — R5383 Other fatigue: Secondary | ICD-10-CM

## 2020-06-11 DIAGNOSIS — J439 Emphysema, unspecified: Secondary | ICD-10-CM | POA: Diagnosis not present

## 2020-06-11 DIAGNOSIS — E559 Vitamin D deficiency, unspecified: Secondary | ICD-10-CM | POA: Diagnosis not present

## 2020-06-11 DIAGNOSIS — E785 Hyperlipidemia, unspecified: Secondary | ICD-10-CM

## 2020-06-11 DIAGNOSIS — F17210 Nicotine dependence, cigarettes, uncomplicated: Secondary | ICD-10-CM

## 2020-06-11 DIAGNOSIS — E538 Deficiency of other specified B group vitamins: Secondary | ICD-10-CM

## 2020-06-11 DIAGNOSIS — Z0001 Encounter for general adult medical examination with abnormal findings: Secondary | ICD-10-CM | POA: Diagnosis not present

## 2020-06-11 DIAGNOSIS — R739 Hyperglycemia, unspecified: Secondary | ICD-10-CM

## 2020-06-11 DIAGNOSIS — R350 Frequency of micturition: Secondary | ICD-10-CM | POA: Diagnosis not present

## 2020-06-11 LAB — VITAMIN D 25 HYDROXY (VIT D DEFICIENCY, FRACTURES): VITD: 14.88 ng/mL — ABNORMAL LOW (ref 30.00–100.00)

## 2020-06-11 LAB — CBC WITH DIFFERENTIAL/PLATELET
Basophils Absolute: 0.1 10*3/uL (ref 0.0–0.1)
Basophils Relative: 1 % (ref 0.0–3.0)
Eosinophils Absolute: 0.1 10*3/uL (ref 0.0–0.7)
Eosinophils Relative: 1.9 % (ref 0.0–5.0)
HCT: 39 % (ref 36.0–46.0)
Hemoglobin: 13 g/dL (ref 12.0–15.0)
Lymphocytes Relative: 31 % (ref 12.0–46.0)
Lymphs Abs: 2.3 10*3/uL (ref 0.7–4.0)
MCHC: 33.4 g/dL (ref 30.0–36.0)
MCV: 90.2 fl (ref 78.0–100.0)
Monocytes Absolute: 0.7 10*3/uL (ref 0.1–1.0)
Monocytes Relative: 10 % (ref 3.0–12.0)
Neutro Abs: 4.1 10*3/uL (ref 1.4–7.7)
Neutrophils Relative %: 56.1 % (ref 43.0–77.0)
Platelets: 343 10*3/uL (ref 150.0–400.0)
RBC: 4.32 Mil/uL (ref 3.87–5.11)
RDW: 14.3 % (ref 11.5–15.5)
WBC: 7.4 10*3/uL (ref 4.0–10.5)

## 2020-06-11 LAB — URINALYSIS, ROUTINE W REFLEX MICROSCOPIC
Bilirubin Urine: NEGATIVE
Ketones, ur: NEGATIVE
Leukocytes,Ua: NEGATIVE
Nitrite: NEGATIVE
Specific Gravity, Urine: 1.025 (ref 1.000–1.030)
Total Protein, Urine: NEGATIVE
Urine Glucose: NEGATIVE
Urobilinogen, UA: 0.2 (ref 0.0–1.0)
WBC, UA: NONE SEEN (ref 0–?)
pH: 6 (ref 5.0–8.0)

## 2020-06-11 LAB — VITAMIN B12: Vitamin B-12: 300 pg/mL (ref 211–911)

## 2020-06-11 LAB — TSH: TSH: 1.09 u[IU]/mL (ref 0.35–4.50)

## 2020-06-11 NOTE — Assessment & Plan Note (Signed)
Etiology unclear, Exam otherwise benign, to check labs as documented, follow with expectant management  

## 2020-06-11 NOTE — Assessment & Plan Note (Signed)
Stable volume, cont current med tx - coreg, lanoxin, entresto, lasix

## 2020-06-11 NOTE — Assessment & Plan Note (Signed)
Also for urinary culture,  to f/u any worsening symptoms or concerns

## 2020-06-11 NOTE — Progress Notes (Signed)
Patient ID: Kimberly [name], female   DOB: 04/05/1945, 75 y.o.   MRN: [id number]         Chief Complaint:: wellness exam and Office Visit (Tired, losing voice, no energy after having COVID)  , low b12, htn, chf, fatigue, hyperglycemia, urinary freq, wt loss       HPI:  Kimberly [name] is a 75 y.o. female here for wellness exam; declines covid booster, mammogram, colonoscopy, tdap for now, o/w up to date with preventive referrals and immunizations.  Still smoking, not ready to quit.                          Also s/p covid onset symtpoms start apr 11 with doxy and prednisone, then found covid +.  Still have marked fatigue, wt loss with low appetite, has lost several lbs. Almost quit smoking but not quite yet,s till working on it.  Has been sleeping more than usual.  No currently taking B12 after several months of replacement Im last yr.  Denies urinary symptoms such as dysuria, urgency, flank pain, hematuria or n/v, fever, chills, but has had urinary frequency worsening for the past wk.   Pt denies polydipsia, polyuria, or new focal neuro s/s.    Wt Readings from Last 3 Encounters:  06/11/20 104 lb (47.2 kg)  03/12/20 108 lb 9.6 oz (49.3 kg)  03/02/20 107 lb 9.6 oz (48.8 kg)   BP Readings from Last 3 Encounters:  06/11/20 110/62  03/12/20 (!) 94/50  03/02/20 110/60   Immunization History  Administered Date(s) Administered  . Influenza, High Dose Seasonal PF 09/30/2018  . Influenza,inj,Quad PF,6+ Mos 10/17/2015  . Influenza,inj,quad, With Preservative 11/17/2016  . Influenza-Unspecified 09/20/2013, 10/13/2017, 09/30/2018, 10/13/2019, 10/13/2019  . PFIZER(Purple Top)SARS-COV-2 Vaccination 03/13/2019, 04/05/2019  . Pneumococcal Conjugate-13 02/20/2010, 11/23/2012  . Pneumococcal Polysaccharide-23 10/27/2017  . Tdap 02/20/2009   There are no preventive care reminders to display for this patient.    Past Medical History:  Diagnosis Date  . AICD (automatic cardioverter/defibrillator) present  03/17/2017  . Anxiety   . Atrial tachycardia ([place])   . Bursitis of shoulder, right, adhesive   . CHF (congestive heart failure) ([place])   . Chronic bronchitis ([place])    "1-2 times/yr" (01/23/2014)  . COPD (chronic obstructive pulmonary disease) ([place])   . CVD (cerebrovascular disease)   . Dyslipidemia   . Dysrhythmia   . Frequency of urination   . GERD (gastroesophageal reflux disease)   . Heart murmur   . History of stomach ulcers   . HTN (hypertension) 02/22/2011  . Migraines    "stopped many years ago" (06/14/2014)  . Osteoporosis 08/19/2016  . Pericarditis   . Pneumonia "10 times" (06/14/2014)  . 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 Silent myocardial infarction ([place]) "late 1990's"  . Stress incontinence    "was suppose to have been tacked up years ago but I didn't do it"  . Syncope, near    Associated with atrial tachycardia-event recorder 1/16  . Thoracic outlet syndrome   . VSD (ventricular septal defect)    Past Surgical History:  Procedure Laterality Date  . ABDOMINAL AORTOGRAM W/LOWER EXTREMITY N/A 12/17/2016   Procedure: ABDOMINAL AORTOGRAM W/LOWER EXTREMITY;  Surgeon: Wellington Hampshire, MD;  Location: [facility];  Service: Cardiovascular;  Laterality: N/A;  . BIV ICD INSERTION CRT-D N/A 03/17/2017   Procedure: BIV ICD INSERTION CRT-D;  Surgeon: Evans Lance, MD;  Location:  [facility];  Service: Cardiovascular;  Laterality: N/A;  . CARDIAC CATHETERIZATION  "quite a few"  . [place]  . CHOLECYSTECTOMY OPEN  1970's  . CORONARY ANGIOGRAM  2000   No significant CAD  . ELECTROPHYSIOLOGIC STUDY N/A 06/14/2014   Procedure: A-Flutter/A-Tach/SVT Ablation;  Surgeon: Evans Lance, MD;  Location: [facility];  Service: Cardiovascular;  Laterality: N/A;  . INSERTION OF ICD  03/17/2017   BIV  . MYRINGOTOMY WITH TUBE PLACEMENT Right 2015  . PERIPHERAL VASCULAR INTERVENTION  12/17/2016   Procedure:  PERIPHERAL VASCULAR INTERVENTION;  Surgeon: Wellington Hampshire, MD;  Location: [place] [facility];  Service: Cardiovascular;;  Right common femoral PTA and Stent  . RIGHT/LEFT HEART CATH AND CORONARY ANGIOGRAPHY Bilateral 11/06/2016   Procedure: RIGHT/LEFT HEART CATH AND CORONARY ANGIOGRAPHY;  Surgeon: Wellington Hampshire, MD;  Location: [facility];  Service: Cardiovascular;  Laterality: Bilateral;  . SVT ABLATION  06/14/2014  . TUBAL LIGATION  1972  . VSD REPAIR  1958; 1967    reports that she has been smoking cigarettes. She has a 11.55 pack-year smoking history. She has never used smokeless tobacco. She reports current alcohol use of about 1.0 standard drink of alcohol per week. She reports that she does not use drugs. family history includes Alcohol abuse in an other family member; Arthritis in some other family members; Cancer in her father, mother, and another family member; Hypertension in some other family members; Stroke in some other family members; Throat cancer in an other family member. Allergies  Allergen Reactions  . Mupirocin Other (See Comments)    Burning, pain, swelling and sob  . Codeine Nausea Only   Current Outpatient Medications on File Prior to Visit  Medication Sig Dispense Refill  . albuterol (VENTOLIN HFA) 108 (90 Base) MCG/ACT inhaler Inhale 2 puffs into the lungs every 6 (six) hours as needed for wheezing or shortness of breath. 8.5 g 5  . ALPRAZolam (XANAX) 0.5 MG tablet 1 tab by mouth twice per day as needed 60 tablet 2  . aspirin 81 MG tablet Take 1 tablet (81 mg total) by mouth daily. 100 tablet 99  . Budeson-Glycopyrrol-Formoterol (BREZTRI AEROSPHERE) 160-9-4.8 MCG/ACT AERO Inhale 2 puffs into the lungs 2 (two) times daily. 5.9 g 0  . carvedilol (COREG) 12.5 MG tablet Take 12.5 mg by mouth 2 (two) times daily with a meal.    . chlorpheniramine-HYDROcodone (TUSSIONEX PENNKINETIC ER) 10-8 MG/5ML SUER Take 5 mLs by mouth every 12 (twelve) hours as needed for  cough. 115 mL 0  . digoxin (LANOXIN) 0.125 MG tablet Take 0.5 tablets (62.5 mcg total) by mouth daily. 45 tablet 3  . ENTRESTO 24-26 MG TAKE 1 TABLET BY MOUTH TWICE A DAY 180 tablet 3  . furosemide (LASIX) 20 MG tablet Take 20 mg by mouth daily as needed.    Marland Kitchen ibuprofen (ADVIL,MOTRIN) 200 MG tablet Take 200 mg by mouth daily as needed for headache.    . rosuvastatin (CRESTOR) 10 MG tablet TAKE 1 TABLET BY MOUTH DAILY 90 tablet 1  . spironolactone (ALDACTONE) 25 MG tablet Take 0.5 tablets (12.5 mg total) by mouth at bedtime. 45 tablet 0   No current facility-administered medications on file prior to visit.        ROS:  All others reviewed and negative.  Objective        PE:  BP 110/62 (BP Location: Left Arm, Patient Position: Sitting, Cuff Size: Normal)  Pulse 60   Temp 98.6 F (37 C) (Oral)   Ht 5\' 1"  (1.549 m)   Wt 104 lb (47.2 kg)   SpO2 96%   BMI 19.65 kg/m                 Constitutional: Pt appears in NAD               HENT: Head: NCAT.                Right Ear: External ear normal.                 Left Ear: External ear normal.                Eyes: . Pupils are equal, round, and reactive to light. Conjunctivae and EOM are normal               Nose: without d/c or deformity               Neck: Neck supple. Gross normal ROM               Cardiovascular: Normal rate and regular rhythm.                 Pulmonary/Chest: Effort normal and breath sounds without rales or wheezing.                Abd:  Soft, NT, ND, + BS, no organomegaly               Neurological: Pt is alert. At baseline orientation, motor grossly intact               Skin: Skin is warm. No rashes, no other new lesions, LE edema - none               Psychiatric: Pt behavior is normal without agitation   Micro: none  Cardiac tracings I have personally interpreted today:  none  Pertinent Radiological findings (summarize): none   Lab Results  Component Value Date   WBC 6.5 05/30/2019   HGB 13.0 05/30/2019    HCT 38.6 05/30/2019   PLT 229.0 05/30/2019   GLUCOSE 90 03/02/2020   CHOL 173 03/02/2020   TRIG 64 03/02/2020   HDL 99 03/02/2020   LDLDIRECT 112.6 03/16/2013   LDLCALC 61 03/02/2020   ALT 11 05/30/2019   AST 17 05/30/2019   NA 140 03/02/2020   K 4.3 03/02/2020   CL 103 03/02/2020   CREATININE 0.87 03/02/2020   BUN 15 03/02/2020   CO2 27 03/02/2020   TSH 0.99 05/30/2019   INR 1.02 12/15/2016   HGBA1C 6.1 05/30/2019   Assessment/Plan:  LEASA KINCANNON is a 75 y.o. White or Caucasian [1] female with  has a past medical history of AICD (automatic cardioverter/defibrillator) present (03/17/2017), Anxiety, Atrial tachycardia ([place]), Bursitis of shoulder, right, adhesive, CHF (congestive heart failure) ([facility]), Chronic bronchitis ([place]), COPD (chronic obstructive pulmonary disease) ([place]), CVD (cerebrovascular disease), Dyslipidemia, Dysrhythmia, Frequency of urination, GERD (gastroesophageal reflux disease), Heart murmur, History of stomach ulcers, HTN (hypertension) (02/22/2011), Migraines, Osteoporosis (08/19/2016), Pericarditis, Pneumonia ("10 times" (06/14/2014)), 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  Encounter for well adult exam with abnormal findings Age and sex appropriate education and counseling updated with regular exercise and diet Referrals for preventative services - decines mammogram, colonoscopy Immunizations addressed - decliens tdap  and covid booster for now Smoking counseling  - counseled to quit, not yet reaady Evidence for depression or other mood disorder - none significant Most recent labs reviewed. I have personally reviewed and have noted: 1) the patient's medical and social history 2) The patient's current medications and supplements 3) The patient's height, weight, and BMI have been  recorded in the chart   Chronic systolic CHF (congestive heart failure) (H[facility]) Stable volume, cont current med tx - coreg, lanoxin, entresto, lasix  Dyslipidemia Lab Results  Component Value Date   LDLCALC 61 03/02/2020   Stable, pt to continue current statin crestor 10   HTN (hypertension) BP Readings from Last 3 Encounters:  06/11/20 110/62  03/12/20 (!) 94/50  03/02/20 110/60   Stable, pt to continue medical treatment coreg, entresto, aldactone   Cigarette smoker Counseled to quit, not yet ready  Fatigue .Etiology unclear, Exam otherwise benign, to check labs as documented, follow with expectant management  Hyperglycemia Lab Results  Component Value Date   HGBA1C 6.1 05/30/2019   Stable, pt to continue current medical treatment  - diet   Post-COVID-19 condition With fatigue, exam benign, cont to follow  B12 deficiency For f/u b12 level  Urinary frequency Also for urinary culture,  to f/u any worsening symptoms or concerns  Weight loss Etiology unclear, for cxr  Followup: Return in about 6 months (around 12/12/2020).  Cathlean Cower, MD 06/11/2020 9:18 PM [facility]

## 2020-06-11 NOTE — Patient Instructions (Signed)
Please continue all other medications as before, and refills have been done if requested.  Please have the pharmacy call with any other refills you may need.  Please continue your efforts at being more active, low cholesterol diet, and weight control.  You are otherwise up to date with prevention measures today.  Please keep your appointments with your specialists as you may have planned  Please go to the XRAY Department in the first floor for the x-ray testing  Please go to the LAB at the blood drawing area for the tests to be done  You will be contacted by phone if any changes need to be made immediately.  Otherwise, you will receive a letter about your results with an explanation, but please check with MyChart first.  Please remember to sign up for MyChart if you have not done so, as this will be important to you in the future with finding out test results, communicating by private email, and scheduling acute appointments online when needed.  Please make an Appointment to return in 6 months, or sooner if needed 

## 2020-06-11 NOTE — Assessment & Plan Note (Signed)
For f/u b12 level 

## 2020-06-11 NOTE — Assessment & Plan Note (Signed)
Etiology unclear, for cxr 

## 2020-06-11 NOTE — Assessment & Plan Note (Signed)
Lab Results  Component Value Date   LDLCALC 61 03/02/2020   Stable, pt to continue current statin crestor 10

## 2020-06-11 NOTE — Assessment & Plan Note (Signed)
BP Readings from Last 3 Encounters:  06/11/20 110/62  03/12/20 (!) 94/50  03/02/20 110/60   Stable, pt to continue medical treatment coreg, entresto, aldactone

## 2020-06-11 NOTE — Assessment & Plan Note (Signed)
Counseled to quit, not yet ready 

## 2020-06-11 NOTE — Assessment & Plan Note (Signed)
Lab Results  Component Value Date   HGBA1C 6.1 05/30/2019   Stable, pt to continue current medical treatment  - diet

## 2020-06-11 NOTE — Assessment & Plan Note (Signed)
With fatigue, exam benign, cont to follow

## 2020-06-11 NOTE — Assessment & Plan Note (Signed)
Age and sex appropriate education and counseling updated with regular exercise and diet Referrals for preventative services - decines mammogram, colonoscopy Immunizations addressed - decliens tdap and covid booster for now Smoking counseling  - counseled to quit, not yet reaady Evidence for depression or other mood disorder - none significant Most recent labs reviewed. I have personally reviewed and have noted: 1) the patient's medical and social history 2) The patient's current medications and supplements 3) The patient's height, weight, and BMI have been recorded in the chart

## 2020-06-12 ENCOUNTER — Other Ambulatory Visit: Payer: Self-pay | Admitting: Internal Medicine

## 2020-06-12 ENCOUNTER — Encounter: Payer: Self-pay | Admitting: Internal Medicine

## 2020-06-12 DIAGNOSIS — R3129 Other microscopic hematuria: Secondary | ICD-10-CM

## 2020-06-12 DIAGNOSIS — E559 Vitamin D deficiency, unspecified: Secondary | ICD-10-CM | POA: Insufficient documentation

## 2020-06-12 LAB — BASIC METABOLIC PANEL
BUN: 16 mg/dL (ref 6–23)
CO2: 28 mEq/L (ref 19–32)
Calcium: 9.5 mg/dL (ref 8.4–10.5)
Chloride: 101 mEq/L (ref 96–112)
Creatinine, Ser: 1.08 mg/dL (ref 0.40–1.20)
GFR: 51.03 mL/min — ABNORMAL LOW (ref >60.00)
Glucose, Bld: 86 mg/dL (ref 70–99)
Potassium: 4.8 mEq/L (ref 3.5–5.1)
Sodium: 138 mEq/L (ref 135–145)

## 2020-06-12 LAB — LIPID PANEL
Cholesterol: 150 mg/dL (ref 0–200)
HDL: 73.7 mg/dL (ref >39.00)
LDL Cholesterol: 65 mg/dL (ref 0–99)
NonHDL: 76.78
Total CHOL/HDL Ratio: 2
Triglycerides: 57 mg/dL (ref 0.0–149.0)
VLDL: 11.4 mg/dL (ref 0.0–40.0)

## 2020-06-12 LAB — URINE CULTURE: Result:: NO GROWTH

## 2020-06-12 LAB — HEPATIC FUNCTION PANEL
ALT: 10 U/L (ref 0–35)
AST: 15 U/L (ref 0–37)
Albumin: 4.2 g/dL (ref 3.5–5.2)
Alkaline Phosphatase: 68 U/L (ref 39–117)
Bilirubin, Direct: 0.1 mg/dL (ref 0.0–0.3)
Total Bilirubin: 0.5 mg/dL (ref 0.2–1.2)
Total Protein: 7.1 g/dL (ref 6.0–8.3)

## 2020-06-13 ENCOUNTER — Encounter: Payer: Self-pay | Admitting: Internal Medicine

## 2020-06-13 ENCOUNTER — Other Ambulatory Visit (HOSPITAL_COMMUNITY): Payer: Self-pay | Admitting: Cardiology

## 2020-06-14 ENCOUNTER — Ambulatory Visit: Payer: Medicare Other | Admitting: Cardiovascular Disease

## 2020-06-14 ENCOUNTER — Encounter: Payer: Self-pay | Admitting: Cardiovascular Disease

## 2020-06-14 ENCOUNTER — Other Ambulatory Visit: Payer: Self-pay

## 2020-06-14 VITALS — BP 104/60 | HR 60 | Ht 61.0 in | Wt 104.1 lb

## 2020-06-14 DIAGNOSIS — I5022 Chronic systolic (congestive) heart failure: Secondary | ICD-10-CM | POA: Diagnosis not present

## 2020-06-14 DIAGNOSIS — I739 Peripheral vascular disease, unspecified: Secondary | ICD-10-CM | POA: Diagnosis not present

## 2020-06-14 DIAGNOSIS — E785 Hyperlipidemia, unspecified: Secondary | ICD-10-CM | POA: Diagnosis not present

## 2020-06-14 DIAGNOSIS — Z720 Tobacco use: Secondary | ICD-10-CM

## 2020-06-14 NOTE — Patient Instructions (Signed)
Your physician recommends that you continue on your current medications as directed. Please refer to the Current Medication list given to you today.  *If you need a refill on your cardiac medications before your next appointment, please call your pharmacy*   Lab Work: None ordered If you have labs (blood work) drawn today and your tests are completely normal, you will receive your results only by: Marland Kitchen MyChart Message (if you have MyChart) OR . A paper copy in the mail If you have any lab test that is abnormal or we need to change your treatment, we will call you to review the results.   Testing/Procedures: None ordered   Follow-Up: At [facility], you and your health needs are our priority.  As part of our continuing mission to provide you with exceptional heart care, we have created designated Provider Care Teams.  These Care Teams include your primary Cardiologist (physician) and Advanced Practice Providers (APPs -  Physician Assistants and Nurse Practitioners) who all work together to provide you with the care you need, when you need it.  We recommend signing up for the patient portal called "MyChart".  Sign up information is provided on this After Visit Summary.  MyChart is used to connect with patients for Virtual Visits (Telemedicine).  Patients are able to view lab/test results, encounter notes, upcoming appointments, etc.  Non-urgent messages can be sent to your provider as well.   To learn more about what you can do with MyChart, go to [email or web address].    Your next appointment:   Your physician wants you to follow-up in: 1 year You will receive a reminder letter in the mail two months in advance. If you don't receive a letter, please call our office to schedule the follow-up appointment.   The format for your next appointment:   In Person  Provider:   You may see Kathlyn Sacramento, MD or one of the following Advanced Practice Providers on your designated Care Team:     Murray Hodgkins, NP  Christell Faith, PA-C  Marrianne Mood, PA-C  Cadence Munster, [place]  Laurann Montana, NP    Other Instructions N/A

## 2020-06-14 NOTE — Progress Notes (Signed)
Cardiology Office Note   Date:  06/14/2020   ID:  Kimberly, [name] 1945/05/24, MRN [id number]  PCP:  Biagio Borg, MD  Cardiologist: Dr. Harrington Challenger  Chief Complaint  Patient presents with  . Other    12 month f/u no complaints today. Meds reviewed verbally with pt.      History of Present Illness: Kimberly [name] is a 75 y.o. female who is here today for a follow-up visit regarding peripheral arterial disease.   She is followed by me for peripheral arterial disease and followed closely at the heart failure clinic.  She has history of congenital heart disease status post VSD repair with multiple prior catheterizations via bilateral femoral arteries when she was a child, SVT status post ablation, hyperlipidemia, COPD, tobacco use, transient atrial fibrillation and chronic systolic heart failure. Patient was seen in 2018 for peripheral arterial disease with nonhealing wound on the right lateral leg above the ankle. She underwent recent noninvasive vascular evaluation which showed a right ABI of 0.62 and left of 0.72. Angiography in 2018 showed moderate left common iliac artery stenosis, short occlusion of right common femoral artery with no significant infrainguinal disease and short occlusion of the left common femoral artery with no significant infrainguinal disease.  I performed successful drug-coated balloon angioplasty and self-expanding stent placement to the right common femoral artery without complications.  She had no recurrent wounds since then. She developed severe restenosis in the right common femoral artery but remained asymptomatic and thus has been monitored closely.   She had COVID-19 infection in April but she recovered. She has been stable from peripheral arterial disease standpoint with no claudication or lower extremity ulceration.  She quit smoking at some point but currently smokes one quarter of a pack daily.  No chest pain.  She reports her dyspnea is stable.  She is  followed at the heart failure clinic.   Past Medical History:  Diagnosis Date  . AICD (automatic cardioverter/defibrillator) present 03/17/2017  . Anxiety   . Atrial tachycardia ([place])   . Bursitis of shoulder, right, adhesive   . CHF (congestive heart failure) ([place])   . Chronic bronchitis ([place])    "1-2 times/yr" (01/23/2014)  . COPD (chronic obstructive pulmonary disease) ([place])   . CVD (cerebrovascular disease)   . Dyslipidemia   . Dysrhythmia   . Frequency of urination   . GERD (gastroesophageal reflux disease)   . Heart murmur   . History of stomach ulcers   . HTN (hypertension) 02/22/2011  . Migraines    "stopped many years ago" (06/14/2014)  . Osteoporosis 08/19/2016  . Pericarditis   . Pneumonia "10 times" (06/14/2014)  . 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 Silent myocardial infarction ([place]) "late 1990's"  . Stress incontinence    "was suppose to have been tacked up years ago but I didn't do it"  . Syncope, near    Associated with atrial tachycardia-event recorder 1/16  . Thoracic outlet syndrome   . VSD (ventricular septal defect)     Past Surgical History:  Procedure Laterality Date  . ABDOMINAL AORTOGRAM W/LOWER EXTREMITY N/A 12/17/2016   Procedure: ABDOMINAL AORTOGRAM W/LOWER EXTREMITY;  Surgeon: Wellington Hampshire, MD;  Location: [facility];  Service: Cardiovascular;  Laterality: N/A;  . BIV ICD INSERTION CRT-D N/A 03/17/2017   Procedure: BIV ICD INSERTION CRT-D;  Surgeon: Evans Lance, MD;  Location: [facility];  Service: Cardiovascular;  Laterality: N/A;  .  CARDIAC CATHETERIZATION  "quite a few"  . [place]  . CHOLECYSTECTOMY OPEN  1970's  . CORONARY ANGIOGRAM  2000   No significant CAD  . ELECTROPHYSIOLOGIC STUDY N/A 06/14/2014   Procedure: A-Flutter/A-Tach/SVT Ablation;  Surgeon: Evans Lance, MD;  Location: [facility];  Service: Cardiovascular;  Laterality: N/A;  . INSERTION OF  ICD  03/17/2017   BIV  . MYRINGOTOMY WITH TUBE PLACEMENT Right 2015  . PERIPHERAL VASCULAR INTERVENTION  12/17/2016   Procedure: PERIPHERAL VASCULAR INTERVENTION;  Surgeon: Wellington Hampshire, MD;  Location: [facility];  Service: Cardiovascular;;  Right common femoral PTA and Stent  . RIGHT/LEFT HEART CATH AND CORONARY ANGIOGRAPHY Bilateral 11/06/2016   Procedure: RIGHT/LEFT HEART CATH AND CORONARY ANGIOGRAPHY;  Surgeon: Wellington Hampshire, MD;  Location: [facility];  Service: Cardiovascular;  Laterality: Bilateral;  . SVT ABLATION  06/14/2014  . TUBAL LIGATION  1972  . VSD REPAIR  1958; 1967     Current Outpatient Medications  Medication Sig Dispense Refill  . albuterol (VENTOLIN HFA) 108 (90 Base) MCG/ACT inhaler Inhale 2 puffs into the lungs every 6 (six) hours as needed for wheezing or shortness of breath. 8.5 g 5  . ALPRAZolam (XANAX) 0.5 MG tablet 1 tab by mouth twice per day as needed 60 tablet 2  . aspirin 81 MG tablet Take 1 tablet (81 mg total) by mouth daily. 100 tablet 99  . Budeson-Glycopyrrol-Formoterol (BREZTRI AEROSPHERE) 160-9-4.8 MCG/ACT AERO Inhale 2 puffs into the lungs 2 (two) times daily. 5.9 g 0  . carvedilol (COREG) 12.5 MG tablet Take 12.5 mg by mouth 2 (two) times daily with a meal.    . chlorpheniramine-HYDROcodone (TUSSIONEX PENNKINETIC ER) 10-8 MG/5ML SUER Take 5 mLs by mouth every 12 (twelve) hours as needed for cough. 115 mL 0  . digoxin (LANOXIN) 0.125 MG tablet Take 0.5 tablets (62.5 mcg total) by mouth daily. 45 tablet 3  . ENTRESTO 24-26 MG TAKE 1 TABLET BY MOUTH TWICE A DAY 180 tablet 3  . furosemide (LASIX) 20 MG tablet Take 20 mg by mouth daily as needed.    Marland Kitchen ibuprofen (ADVIL,MOTRIN) 200 MG tablet Take 200 mg by mouth daily as needed for headache.    . rosuvastatin (CRESTOR) 10 MG tablet TAKE 1 TABLET BY MOUTH DAILY 90 tablet 1  . spironolactone (ALDACTONE) 25 MG tablet TAKE ONE TABLET BY MOUTH AT BEDTIME 90 tablet 0   No current  facility-administered medications for this visit.    Allergies:   Mupirocin and Codeine    Social History:  The patient  reports that she has been smoking cigarettes. She has a 11.55 pack-year smoking history. She has never used smokeless tobacco. She reports current alcohol use of about 1.0 standard drink of alcohol per week. She reports that she does not use drugs.   Family History:  The patient's family history includes Alcohol abuse in an other family member; Arthritis in some other family members; Cancer in her father, mother, and another family member; Hypertension in some other family members; Stroke in some other family members; Throat cancer in an other family member.    ROS:  Please see the history of present illness.   Otherwise, review of systems are positive for none.   All other systems are reviewed and negative.    PHYSICAL EXAM: VS:  BP 104/60 (BP Location: Left Arm, Patient Position: Sitting, Cuff Size: Normal)   Pulse 60   Ht 5\' 1"  (1.549  m)   Wt 104 lb 2 oz (47.2 kg)   SpO2 96%   BMI 19.67 kg/m  , BMI Body mass index is 19.67 kg/m. GEN: Well nourished, well developed, in no acute distress  HEENT: normal  Neck: no JVD, carotid bruits, or masses Cardiac: RRR; no  rubs, or gallops,no edema. 2/f 6 holosystolic murmur at the left sternal border and apex. Respiratory:  clear to auscultation bilaterally, normal work of breathing GI: soft, nontender, nondistended, + BS MS: no deformity or atrophy  Skin: warm and dry, no rash Neuro:  Strength and sensation are intact Psych: euthymic mood, full affect Vascular: Radial pulse is absent bilaterally.    EKG:  EKG is ordered today. The ekg ordered today demonstrates dual-chamber biventricular pacemaker.   Recent Labs: 06/11/2020: ALT 10; BUN 16; Creatinine, Ser 1.08; Hemoglobin 13.0; Platelets 343.0; Potassium 4.8; Sodium 138; TSH 1.09    Lipid Panel    Component Value Date/Time   CHOL 150 06/11/2020 1551   TRIG 57.0  06/11/2020 1551   HDL 73.70 06/11/2020 1551   CHOLHDL 2 06/11/2020 1551   VLDL 11.4 06/11/2020 1551   LDLCALC 65 06/11/2020 1551   LDLDIRECT 112.6 03/16/2013 1057      Wt Readings from Last 3 Encounters:  06/14/20 104 lb 2 oz (47.2 kg)  06/11/20 104 lb (47.2 kg)  03/12/20 108 lb 9.6 oz (49.3 kg)       No flowsheet data found.    ASSESSMENT AND PLAN:  1.  Peripheral arterial disease .  Status post endovascular revascularization of the right common femoral artery with worsening proximal restenosis.  In spite of that, she continues to be asymptomatic.  Continue medical therapy.  Reserve angiography for symptoms.  2.  Chronic systolic heart failure: Due to nonischemic cardiomyopathy.  She appears to be euvolemic and currently on optimal medical therapy followed at the heart failure clinic.  3.  Hyperlipidemia: Continue treatment with rosuvastatin with a target LDL of less than 70.  Most recent lipid profile showed an LDL of 65.  4.  Tobacco use: I discussed the importance of smoking cessation.    Disposition:   FU with me in 12 month  Signed,  Kathlyn Sacramento, MD  06/14/2020 10:03 AM    [place]

## 2020-06-19 ENCOUNTER — Ambulatory Visit (INDEPENDENT_AMBULATORY_CARE_PROVIDER_SITE_OTHER): Payer: Medicare Other

## 2020-06-19 DIAGNOSIS — I5022 Chronic systolic (congestive) heart failure: Secondary | ICD-10-CM

## 2020-06-21 ENCOUNTER — Encounter: Payer: Self-pay | Admitting: Internal Medicine

## 2020-06-21 LAB — CUP PACEART REMOTE DEVICE CHECK
Battery Remaining Longevity: 96 mo
Battery Remaining Percentage: 100 %
Brady Statistic RA Percent Paced: 48 %
Brady Statistic RV Percent Paced: 96 %
Date Time Interrogation Session: 20220601143500
HighPow Impedance: 73 Ohm
Implantable Lead Implant Date: 20190226
Implantable Lead Implant Date: 20190226
Implantable Lead Implant Date: 20190226
Implantable Lead Location: 753858
Implantable Lead Location: 753859
Implantable Lead Location: 753860
Implantable Lead Model: 292
Implantable Lead Model: 4671
Implantable Lead Model: 7740
Implantable Lead Serial Number: 444536
Implantable Lead Serial Number: 726363
Implantable Lead Serial Number: 805938
Implantable Pulse Generator Implant Date: 20190226
Lead Channel Impedance Value: 463 Ohm
Lead Channel Impedance Value: 556 Ohm
Lead Channel Impedance Value: 808 Ohm
Lead Channel Setting Pacing Amplitude: 2 V
Lead Channel Setting Pacing Amplitude: 2.5 V
Lead Channel Setting Pacing Amplitude: 2.6 V
Lead Channel Setting Pacing Pulse Width: 0.4 ms
Lead Channel Setting Pacing Pulse Width: 0.4 ms
Lead Channel Setting Sensing Sensitivity: 0.5 mV
Lead Channel Setting Sensing Sensitivity: 1 mV
Pulse Gen Serial Number: 204176

## 2020-06-21 MED ORDER — HYDROCOD POLST-CPM POLST ER 10-8 MG/5ML PO SUER: 5.0000 mL | Freq: Two times a day (BID) | ORAL | 0 refills | Status: DC | PRN

## 2020-06-22 ENCOUNTER — Other Ambulatory Visit: Payer: Self-pay

## 2020-06-22 ENCOUNTER — Ambulatory Visit (HOSPITAL_COMMUNITY)
Admission: RE | Admit: 2020-06-22 | Discharge: 2020-06-22 | Disposition: A | Discharge status: Home / Self Care | Payer: Medicare Other | Source: Ambulatory Visit | Attending: Cardiology | Admitting: Cardiology

## 2020-06-22 ENCOUNTER — Encounter (HOSPITAL_COMMUNITY): Payer: Self-pay | Admitting: Cardiology

## 2020-06-22 VITALS — BP 86/50 | HR 60 | Ht 61.0 in | Wt 104.2 lb

## 2020-06-22 DIAGNOSIS — J449 Chronic obstructive pulmonary disease, unspecified: Secondary | ICD-10-CM | POA: Diagnosis not present

## 2020-06-22 DIAGNOSIS — E785 Hyperlipidemia, unspecified: Secondary | ICD-10-CM | POA: Diagnosis not present

## 2020-06-22 DIAGNOSIS — F17210 Nicotine dependence, cigarettes, uncomplicated: Secondary | ICD-10-CM | POA: Diagnosis not present

## 2020-06-22 DIAGNOSIS — Z8616 Personal history of COVID-19: Secondary | ICD-10-CM | POA: Insufficient documentation

## 2020-06-22 DIAGNOSIS — Z79899 Other long term (current) drug therapy: Secondary | ICD-10-CM | POA: Insufficient documentation

## 2020-06-22 DIAGNOSIS — I739 Peripheral vascular disease, unspecified: Secondary | ICD-10-CM | POA: Diagnosis not present

## 2020-06-22 DIAGNOSIS — I5022 Chronic systolic (congestive) heart failure: Secondary | ICD-10-CM | POA: Insufficient documentation

## 2020-06-22 DIAGNOSIS — I428 Other cardiomyopathies: Secondary | ICD-10-CM | POA: Insufficient documentation

## 2020-06-22 DIAGNOSIS — I2510 Atherosclerotic heart disease of native coronary artery without angina pectoris: Secondary | ICD-10-CM | POA: Diagnosis not present

## 2020-06-22 DIAGNOSIS — Z7982 Long term (current) use of aspirin: Secondary | ICD-10-CM | POA: Diagnosis not present

## 2020-06-22 MED ORDER — CARVEDILOL 12.5 MG PO TABS: 6.2500 mg | ORAL_TABLET | Freq: Two times a day (BID) | ORAL | 0 refills | Status: DC

## 2020-06-22 NOTE — Patient Instructions (Addendum)
  Decrease Coreg to 6.25 mg ( 1/2 tablet) Twice daily  Your physician has requested that you have an echocardiogram. Echocardiography is a painless test that uses sound waves to create images of your heart. It provides your doctor with information about the size and shape of your heart and how well your heart's chambers and valves are working. This procedure takes approximately one hour. There are no restrictions for this procedure.  Your physician recommends that you schedule a follow-up appointment in: 3 months with   echocardiogram  Take your B/P daily and record  If you have any questions or concerns before your next appointment please send [place] a message through [facility] or call our office at [phone].    TO LEAVE A MESSAGE FOR THE NURSE SELECT OPTION 2, PLEASE LEAVE A MESSAGE INCLUDING: . YOUR NAME . DATE OF BIRTH . CALL BACK NUMBER . REASON FOR CALL**this is important as we prioritize the call backs  [place] AS LONG AS YOU CALL BEFORE 4:00 PM At the [facility], you and your health needs are our priority. As part of our continuing mission to provide you with exceptional heart care, we have created designated Provider Care Teams. These Care Teams include your primary Cardiologist (physician) and Advanced Practice Providers (APPs- Physician Assistants and Nurse Practitioners) who all work together to provide you with the care you need, when you need it.   You may see any of the following providers on your designated Care Team at your next follow up: Marland Kitchen Dr Glori Bickers . Dr Loralie Champagne . Dr Vickki Muff . Darrick Grinder, NP . Lyda Jester, [place] . Audry Riles, PharmD   Please be sure to bring in all your medications bottles to every appointment.

## 2020-06-22 NOTE — Progress Notes (Signed)
PCP: Dr. Jenny Reichmann Cardiology: Dr. Harrington Challenger HF Cardiology: Dr. Aundra Dubin Cardiology/Vascular: Dr Fletcher Anon  75 y.o.with history of VSD repair as a child, COPD/active smoking, PAD, and chronic systolic CHF.  Patient has a long history of cardiomyopathy.  She had VSD repaired as a child and imaging has not showed residual VSD.  Cardiac MRI in 11/09 showed EF 36%.  Echo in 12/15 showed EF 40-45%.  Echo in 3/18 showed EF down to 15-20% with moderate RV systolic dysfunction.  RHC/LHC in 10/18 showed nonobstructive coronary but was concerning for low output (CI 1.33).  She had a right lower leg slowly healing ulcer.  She had peripheral arteriogram with bilateral occluded CFAs. She had PCI to right CFA.    CPX in 1/19 showed moderate to severe functional limitation, combination of HF and lung disease, probably more related to the lung disease.  PFTs in 12/18 showed severe COPD.   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    7/19 peripheral arterial dopplers showed significant in-stent restenosis right SFA stent.  Echo in 4/21 showed EF 35-40% with mild AI.  CPX in 5/21 showed severe COPD, mild-moderate HF limitation.  11/21 Cardiolite showed prior septal and apical infarction, no ischemia.   Had COVID April 2022. She has continued to recover. Says food taste different.   Today she returns for HF follow up.Overall feeling ok but has ongoing cough. Denies/PND/Orthopnea. Appetite poor ok. No fever or chills. Weight at home trending down 100-104 pounds. Taking all medications. Smoking 2 packs per week.   [name] Heartlogic Score 5   Labs (7/18): LDL 61 Labs (11/18): K 4.2, creatinine 0.7, hgb 13.7 Labs (1/19): K 4.3, creatinine 1.07 Labs (2/19): K 4.5, creatinine 1.14 Labs (7/19): K 4.4, creatinine 1.04, LDL 61, digoxin 1.2 Labs (9/19): digoxin 0.7, K 4.8, creatinine 0.93 Labs (1/20): K 4.3, creatinine 1.04 Labs (12/20): LDL 63, HDL 87, K 4.7, creatinine 1.01 Labs (5/21): LDL 65, HDL 80, K  4.9, creatinine 0.91 Labs (10/21): K 4.4, creatinine 1.1 Labs (11/21): digoxin 0.3, K 4.3, creatinine 1.0 Labs (5/22): K 4.9 Creatinine 1.1, LDL 65  PMH: 1. VSD: s/p repair at [facility] as child.  From description, sounds like muscular VSD.  2. Atrial tachycardia s/p ablation.  3. Hyperlipidemia 4. COPD: Active smoker.  - PFTs (12/18): Severe obstructive lung disease.  5. Atrial fibrillation: Paroxysmal.   Noted only transiently.   6. PAD: ABIs 11/18 with 0.62 on right, 0.72 on left.  - Angiogram 11/18 showed totally occluded bilateral CFAs.  Patient had stent to right CFA.  ABIs in 12/18 were normal on right. - Peripheral arterial dopplers (7/19) with significant in-stent restenosis right CFA.   - ABIs (10/20): ABI 0.8 right, 0.74 left - 12/21 dopplers with R CFA stent in-stenosis.  7. Chronic systolic CHF: Nonischemic cardiomyopathy.   - cMRI (11/09): EF 36%, VSD patch in anterior ventricular septum - Echo (12/15): EF 40-45% - Echo (3/18): EF 15-20%, moderate LV dilation, moderate MR, moderate RV dilation with moderately decreased systolic function.  - LHC/RHC (10/18): 3+ MR, EF < 25%, minimal nonobstructive CAD; PA 41/21, LVEDP 18, CI 1.33.  - CPX (1/19): peak VO2 13.4, VE/VCO2 slope 43, RER 1.07.  Mod-severe functional limitation due to combination of HF and lung disease, probably more related to lung disease.  - [name] CRT-D device.  - Echo (7/19): EF 25-30%, mild LV dilation, mild MR, normal RV size and systolic function.  - Echo (4/21): EF 35-40%, mild AI - CPX (  5/21): severe COPD, mild-moderate HF limitation.  - Cardiolite (11/21): EF 47%, prior septal and apical infarction, no ischemia. 8. PVCs: Zio patch 9/19 with 6% PVCs.  9. COVID-19 (4/22)  SH: Prior smoker, lives in [place], married.  FH: Mother with PAD, sister died at birth from congenital heart disease.   ROS: All systems reviewed and negative except as per HPI.   Current Outpatient Medications   Medication Sig Dispense Refill  . albuterol (VENTOLIN HFA) 108 (90 Base) MCG/ACT inhaler Inhale 2 puffs into the lungs every 6 (six) hours as needed for wheezing or shortness of breath. 8.5 g 5  . ALPRAZolam (XANAX) 0.5 MG tablet 1 tab by mouth twice per day as needed 60 tablet 2  . aspirin 81 MG tablet Take 1 tablet (81 mg total) by mouth daily. 100 tablet 99  . Budeson-Glycopyrrol-Formoterol (BREZTRI AEROSPHERE) 160-9-4.8 MCG/ACT AERO Inhale 2 puffs into the lungs 2 (two) times daily. 5.9 g 0  . carvedilol (COREG) 12.5 MG tablet Take 12.5 mg by mouth 2 (two) times daily with a meal.    . chlorpheniramine-HYDROcodone (TUSSIONEX PENNKINETIC ER) 10-8 MG/5ML SUER Take 5 mLs by mouth every 12 (twelve) hours as needed for cough. 180 mL 0  . digoxin (LANOXIN) 0.125 MG tablet Take 0.5 tablets (62.5 mcg total) by mouth daily. 45 tablet 3  . ENTRESTO 24-26 MG TAKE 1 TABLET BY MOUTH TWICE A DAY 180 tablet 3  . furosemide (LASIX) 20 MG tablet Take 20 mg by mouth daily as needed.    Marland Kitchen ibuprofen (ADVIL,MOTRIN) 200 MG tablet Take 200 mg by mouth daily as needed for headache.    . rosuvastatin (CRESTOR) 10 MG tablet TAKE 1 TABLET BY MOUTH DAILY 90 tablet 1  . spironolactone (ALDACTONE) 25 MG tablet TAKE ONE TABLET BY MOUTH AT BEDTIME 90 tablet 0   No current facility-administered medications for this encounter.   BP (!) 86/50   Pulse 60   Ht 5\' 1"  (1.549 m)   Wt 47.3 kg (104 lb 3.2 oz)   SpO2 96%   BMI 19.69 kg/m   Wt Readings from Last 3 Encounters:  06/22/20 47.3 kg (104 lb 3.2 oz)  06/14/20 47.2 kg (104 lb 2 oz)  06/11/20 47.2 kg (104 lb)    General:  Thin. Well appearing. No resp difficulty HEENT: normal Neck: supple. no JVD. Carotids 2+ bilat; no bruits. No lymphadenopathy or thryomegaly appreciated. Cor: PMI nondisplaced. Regular rate & rhythm. No rubs, gallops or murmurs. Lungs: Coarse throughout Abdomen: soft, nontender, nondistended. No hepatosplenomegaly. No bruits or masses. Good  bowel sounds. Extremities: no cyanosis, clubbing, rash, edema Neuro: alert & orientedx3, cranial nerves grossly intact. moves all 4 extremities w/o difficulty. Affect pleasant  EKG: AV paced with occasional PVCs 66 bpm   Assessment/Plan: 1.  PAD: She had PCI to occluded right CFA in 11/18, but 7/19 peripheral arterial dopplers and again in 12/21 showed significant in-stent restenosis in the right CFA. She has occluded left CFA also that was not intervened on. Minimal claudication and no pedal ulcerations. She is still smoking (1-2 cigs/day).  - Medical management in absence significant symptoms.  - Continue statin.  -Discussed smoking cessation.  - Followed by Dr Fletcher Anon  2. CAD: Nonobstructive on 10/18 cath. She has atypical chest pain at times with stress.  Cardiolite in 11/21 showed no ischemia.  - No chest pain.  - She is on ASA 81 and statin.  3. Hyperlipidemia: Good lipids in 5/21.  Continue Crestor, check lipids  today.   4. COPD: Severe by PFTs on 5/21 CPX. COPD plays a significant role in her dyspnea. She has started back to smoking a couple cigarettes/day.  - Needs to quit smoking completely, as above, recommend nicotine patch.  6. Chronic systolic CHF: Nonischemic cardiomyopathy.  RHC/LHC in 10/18 showed no nonobstructive coronary disease but CI was very low at 1.33.  Her low cardiac output was out of proportion to her symptoms.  [name] CRT-D device.  CPX in 5/21 showed severe COPD but only mild-moderate HF limitation.  Most recent echo in 4/21 showed EF 35-40%.  Heart Logic Score 5 - NYHA II. Volume status low.  - Continue Entresto 24/26 bid. .   - Cut coreg back to 6.25 mg twice a day.      - Continue spironolactone 25 mg daily, take in evening as this helps to limit orthostatic symptoms.  - Continue digoxin - Unable to tolerate [place].   Follow up 3-4 months with Dr Martyn Malay   Amy Clegg 06/22/2020  Patient seen with NP, agree with the above note.   She is  euvolemic on exam with stable NYHA class II symptoms.  She has been having orthostatic symptoms.  - Decrease Coreg to 6.25 mg bid, continue other meds.  - Echo at followup in 3 months.   Loralie Champagne 06/24/2020

## 2020-06-26 ENCOUNTER — Ambulatory Visit: Payer: Medicare Other | Admitting: Internal Medicine

## 2020-06-28 ENCOUNTER — Ambulatory Visit: Payer: Medicare Other | Admitting: Family Medicine

## 2020-06-29 ENCOUNTER — Telehealth (HOSPITAL_COMMUNITY): Payer: Self-pay | Admitting: *Deleted

## 2020-06-29 NOTE — Telephone Encounter (Signed)
No changes right now.

## 2020-06-29 NOTE — Telephone Encounter (Signed)
Pt called to give bp readings since decrease in carvedilol.  6/4- 137/60 6/5-  145/65 6/6-  115/53 6/7-  117/55 6/8-  119/51 6/9-   98/52 (dizzy all day ate pork to make bp come up to 119/43) 6/10-  92/47 (dizzy 35-66min)  Patient asked if there were any medication she needed to make. No complaints other than dizziness when systolic bp is in the 63'J.   Routed to [place] for advice

## 2020-06-29 NOTE — Telephone Encounter (Signed)
Pt aware. Pt said if her dizziness continues she will cut the 6.25mg  carvedilol in half and call our office Monday.

## 2020-07-02 ENCOUNTER — Ambulatory Visit: Payer: Medicare Other

## 2020-07-04 ENCOUNTER — Other Ambulatory Visit (HOSPITAL_COMMUNITY): Payer: Self-pay | Admitting: Cardiology

## 2020-07-11 NOTE — Progress Notes (Signed)
Remote ICD transmission.   

## 2020-07-18 ENCOUNTER — Telehealth (HOSPITAL_COMMUNITY): Payer: Self-pay | Admitting: *Deleted

## 2020-07-18 NOTE — Telephone Encounter (Signed)
Pt called stating she cut the carvedilol in half but she experienced  dizzy spells yesterday and today not as bad or frequent. Pt said she ate and had some water and dizziness went away.  Pt said her bp is normal.

## 2020-07-27 ENCOUNTER — Other Ambulatory Visit: Payer: Self-pay

## 2020-07-27 ENCOUNTER — Ambulatory Visit: Payer: Medicare Other | Admitting: Internal Medicine

## 2020-07-27 ENCOUNTER — Encounter: Payer: Self-pay | Admitting: Internal Medicine

## 2020-07-27 DIAGNOSIS — F17210 Nicotine dependence, cigarettes, uncomplicated: Secondary | ICD-10-CM

## 2020-07-27 DIAGNOSIS — J449 Chronic obstructive pulmonary disease, unspecified: Secondary | ICD-10-CM | POA: Diagnosis not present

## 2020-07-27 NOTE — Progress Notes (Addendum)
Kimberly [name], female    DOB: 1945-11-12     MRN: [id number]   Brief patient profile:  18 yowf active smoker  GOLD III  referred to pulmonary clinic 09/05/2019 by Dr  Aundra Dubin for sob     History of Present Illness  09/05/2019  Pulmonary/ 1st office eval/Kimberly [name]  - no maint rx  Chief Complaint  Patient presents with   Follow-up    pt needs help to quit smoking.pt is wearing patches .pt can'y take chantix  Dyspnea:  Does [facility] [employer] s maint rx but struggles  Cough: typical for her takes 4-5 coughs with mucoid mucus and then  good to go  Sleep: sleeps bed flat/ one pillow on side  SABA use: one  5-6 x daily Rec Plan A = Automatic = Always=    Breztri Take 2 puffs first thing in am and then another 2 puffs about 12 hours later.  Work on inhaler technique: Plan B = Backup (to supplement plan A, not to replace it) Only use your albuterol inhaler as a rescue medication Prednisone 10 mg take  4 each am x 2 days,   2 each am x 2 days,  1 each am x 2 days and stop  The key is to stop smoking completely before smoking completely stops you! Add: needs alpha one AT screen next ov   01/25/2020  f/u ov/Kimberly [name] re: needs alpha one testing/ GOLD III copd / smoking  Chief Complaint  Patient presents with   Follow-up    Breathing is "ok"- she feels that the Gutierrez helped but she ran out after 2 samples and copay to high. She is using her albuterol inhaler 3-4 x per wk.   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  I will call the drug rep for breztri to see what happened with your application > informed he is not able to do this due to company policy / HIPPA laws  Please schedule a follow up visit in 3 months but call sooner if needed    .07/27/2020  f/u ov/Kimberly [name] re:  GOLD III copd/ last smoked last week / maint on breztri   Chief Complaint  Patient presents with   Follow-up    Breathing is overall doing well. She is using her albuterol inhaler approx 2 x per day.    Dyspnea:  better doing cart at [employer] and [employer]/ no 02, more trouble with bending over  Cough: variable / no purulent sputum Sleeping: sometimes cough noct /bed is flat / one pillow  SABA use: 3 x daily and never pre challenges or re -challenges p saba 02: none  Covid status:   had omicoron in April 2022    No obvious day to day or daytime variability or assoc excess/ purulent sputum or mucus plugs or hemoptysis or cp or chest tightness, subjective  wheeze or overt sinus or hb symptoms.     Also denies any obvious fluctuation of symptoms with weather or environmental changes or other aggravating or alleviating factors except as outlined above   No unusual exposure hx or h/o childhood pna/ asthma or knowledge of premature birth.  Current Allergies, Complete Past Medical History, Past Surgical History, Family History, and Social History were reviewed in [employer] record.  ROS  The following are not active complaints unless bolded Hoarseness, sore throat, dysphagia, dental problems, itching, sneezing,  nasal congestion or discharge of excess mucus or purulent secretions, ear ache,   fever, chills, sweats, unintended wt loss or wt gain, classically pleuritic or exertional cp,  orthopnea pnd or arm/hand swelling  or leg swelling, presyncope, palpitations, abdominal pain, anorexia, nausea, vomiting, diarrhea  or change in bowel habits or change in bladder habits, change in stools or change in urine, dysuria,  hematuria,  rash, arthralgias, visual complaints, headache, numbness, weakness or ataxia or problems with walking or coordination,  change in mood or  memory.        Current Meds  Medication Sig   albuterol (VENTOLIN HFA) 108 (90 Base) MCG/ACT inhaler Inhale 2 puffs into the lungs every 6 (six) hours as needed for wheezing or shortness of breath.   ALPRAZolam (XANAX) 0.5 MG tablet 1 tab by mouth twice per day as needed   aspirin 81 MG tablet Take 1 tablet (81 mg total) by mouth daily.   Budeson-Glycopyrrol-Formoterol (BREZTRI AEROSPHERE) 160-9-4.8 MCG/ACT AERO Inhale 2 puffs into the lungs 2 (two) times daily.   carvedilol (COREG) 12.5 MG tablet Take 0.5 tablets (6.25 mg total) by mouth 2 (two) times daily with a meal. (Patient taking differently: Take 6.25 mg by mouth daily.)   chlorpheniramine-HYDROcodone (TUSSIONEX PENNKINETIC ER) 10-8 MG/5ML SUER Take 5 mLs by mouth every 12 (twelve) hours as needed for cough.   digoxin (LANOXIN) 0.125 MG tablet TAKE 1/2 TABLET BY MOUTH DAILY   ENTRESTO 24-26 MG TAKE 1 TABLET BY MOUTH TWICE A DAY   furosemide (LASIX) 20 MG tablet Take 20 mg by mouth daily as needed.   ibuprofen (ADVIL,MOTRIN) 200 MG tablet Take 200 mg by mouth daily as needed for headache.   rosuvastatin (CRESTOR) 10 MG tablet TAKE 1 TABLET BY MOUTH DAILY   spironolactone (ALDACTONE) 25 MG tablet TAKE ONE TABLET BY MOUTH AT BEDTIME (Patient taking differently: Take 12.5 mg by mouth daily.)          Past Medical History:  Diagnosis Date   AICD (automatic cardioverter/defibrillator) present 03/17/2017   Anxiety    Atrial tachycardia (HCC)    Bursitis of shoulder, right, adhesive    CHF (congestive heart failure) ([place])    Chronic bronchitis ([place])    "1-2 times/yr" (01/23/2014)   COPD (chronic obstructive pulmonary disease) (HCC)    CVD (cerebrovascular disease)    Dyslipidemia    Dysrhythmia    Frequency of urination    GERD (gastroesophageal reflux disease)    Heart murmur     History of stomach ulcers    HTN (hypertension) 02/22/2011   Migraines    "stopped many years ago" (06/14/2014)   Osteoporosis 08/19/2016   Pericarditis    Pneumonia "10 times" (06/14/2014)   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)    Silent myocardial infarction ([place]) "late 1990's"   Stress incontinence    "was suppose to have been tacked up years ago but I didn't do it"   Syncope, near  Associated with atrial tachycardia-event recorder 1/16   Thoracic outlet syndrome    VSD (ventricular septal defect)        Objective:     07/27/2020          106  01/25/20 105 lb (47.6 kg)  12/06/19 109 lb (49.4 kg)  12/05/19 106 lb (48.1 kg)    Pleasant amb wf nad/ smoker's rattle  Vital signs reviewed  07/27/2020  - Note at rest 02 sats  98% on RA and bp 86/54 (cards directing/ aware if stands up too fast gets light headed)      HEENT : pt wearing mask not removed for exam due to covid -19 concerns.    NECK :  without JVD/Nodes/TM/ nl carotid upstrokes bilaterally   LUNGS: no acc muscle use,  Mod barrel  contour chest wall with bilateral  Distant bs s audible wheeze and  without cough on insp or exp maneuvers and mod  Hyperresonant  to  percussion bilaterally     CV:  RRR  no s3 or murmur or increase in P2, and no edema   ABD:  soft and nontender with pos mid insp Hoover's  in the supine position. No bruits or organomegaly appreciated, bowel sounds nl  MS:     ext warm without deformities, calf tenderness, cyanosis or clubbing No obvious joint restrictions   SKIN: warm and dry without lesions    NEURO:  alert, approp, nl sensorium with  no motor or cerebellar deficits apparent.          I personally reviewed images and agree with radiology impression as follows:  CXR:   06/11/20  Emphysema and chronic lung changes without definite evidence of acute cardiopulmonary disease.          Assessment      .

## 2020-07-27 NOTE — Patient Instructions (Addendum)
Only use your albuterol as a rescue medication to be used if you can't catch your breath by resting or doing a relaxed purse lip breathing pattern.  - The less you use it, the better it will work when you need it. - Ok to use up to 2 puffs  every 4 hours if you must but call for immediate appointment if use goes up over your usual need - Don't leave home without it !!  (think of it like starter fluid or  spare tire for your car)   Also ok to try albuterol 15 min before an activity (on alternating days)  that you know would make you short of breath and see if it makes any difference and if makes none then don't take albuterol after activity unless you can't catch your breath as this means it's the resting that helps, not the albuterol.  The key is to stop smoking completely before smoking completely stops you!       Please schedule a follow up visit in 6 months but call sooner if needed

## 2020-07-29 ENCOUNTER — Encounter: Payer: Self-pay | Admitting: Internal Medicine

## 2020-07-29 NOTE — Assessment & Plan Note (Signed)
?   Quit smokng 07/2020  - PFT's  01/12/17   FEV1 0.88 (44 % ) ratio 0.51  p 16 % improvement from saba p ? prior to study with DLCO  9.68 (47%) corrects to 3.47 (78%)  for alv volume and FV curve slt atypical concave pattern  - 09/05/2019   try breztri  - Labs ordered 01/25/2020  :    alpha one AT phenotype  MS  Level 134  - 07/27/2020  After extensive coaching inhaler device,  effectiveness =    75% (short ti)    Group D in terms of symptom/risk and laba/lama/ICS  therefore appropriate rx at this point >>>  breztr and approp saba  Re saba: I spent extra time with pt today reviewing appropriate use of albuterol for prn use on exertion with the following points: 1) saba is for relief of sob that does not improve by walking a slower pace or resting but rather if the pt does not improve after trying this first. 2) If the pt is convinced, as many are, that saba helps recover from activity faster then it's easy to tell if this is the case by re-challenging : ie stop, take the inhaler, then p 5 minutes try the exact same activity (intensity of workload) that just caused the symptoms and see if they are substantially diminished or not after saba 3) if there is an activity that reproducibly causes the symptoms, try the saba 15 min before the activity on alternate days   If in fact the saba really does help, then fine to continue to use it prn but advised may need to look closer at the maintenance regimen being used to achieve better control of airways disease with exertion.

## 2020-07-29 NOTE — Assessment & Plan Note (Signed)
Reinforced smoking maintaining smoking cessation at this point  Low-dose CT lung cancer screening is recommended for patients who are 92-73 years of age with a 30+ pack-year history of smoking, and who are currently smoking or quit <=15 years ago.   Lung cancer screening advised but should schedule thru pcp as part of annual health maint          Each maintenance medication was reviewed in detail including emphasizing most importantly the difference between maintenance and prns and under what circumstances the prns are to be triggered using an action plan format where appropriate.  Total time for H and P, chart review, counseling, reviewing hfa  device(s) and generating customized AVS unique to this office visit / same day charting = 31 min

## 2020-08-17 ENCOUNTER — Telehealth: Payer: Self-pay | Admitting: Internal Medicine

## 2020-08-17 NOTE — Chronic Care Management (AMB) (Signed)
  Chronic Care Management   Outreach Note  08/17/2020 Name: Kimberly [name] MRN: [id number] DOB: 27-Mar-1945  Referred by: Biagio Borg, MD Reason for referral : No chief complaint on file.   An unsuccessful telephone outreach was attempted today. The patient was referred to the pharmacist for assistance with care management and care coordination.   Follow Up Plan:   Lauretta Grill [facility] Scheduler

## 2020-08-21 ENCOUNTER — Other Ambulatory Visit: Payer: Self-pay

## 2020-08-21 ENCOUNTER — Ambulatory Visit (INDEPENDENT_AMBULATORY_CARE_PROVIDER_SITE_OTHER): Payer: Medicare Other | Admitting: Cardiovascular Disease

## 2020-08-21 VITALS — BP 100/50 | HR 64 | Ht 61.5 in | Wt 105.5 lb

## 2020-08-21 DIAGNOSIS — I739 Peripheral vascular disease, unspecified: Secondary | ICD-10-CM

## 2020-08-21 DIAGNOSIS — I5022 Chronic systolic (congestive) heart failure: Secondary | ICD-10-CM

## 2020-08-21 DIAGNOSIS — E785 Hyperlipidemia, unspecified: Secondary | ICD-10-CM

## 2020-08-21 DIAGNOSIS — Z720 Tobacco use: Secondary | ICD-10-CM

## 2020-08-21 NOTE — Progress Notes (Signed)
Cardiology Office Note   Date:  08/21/2020   ID:  Kimberly, [name] [birth year], MRN [id number]  PCP:  Biagio Borg, MD  Cardiologist: Dr. Harrington Challenger  Chief Complaint  Patient presents with   Other    6 month f/u c/o low BP and leg/calve cramping. Meds reviewed verbally with pt.      History of Present Illness: Kimberly [name] is a 75 y.o. female who is here today for a follow-up visit regarding peripheral arterial disease.   She is followed by me for peripheral arterial disease and followed closely at the heart failure clinic.  She has history of congenital heart disease status post VSD repair with multiple prior catheterizations via bilateral femoral arteries when she was a child, SVT status post ablation, hyperlipidemia, COPD, tobacco use, transient atrial fibrillation and chronic systolic heart failure. Patient was seen in 2018 for peripheral arterial disease with nonhealing wound on the right lateral leg above the ankle. She underwent recent noninvasive vascular evaluation which showed a right ABI of 0.62 and left of 0.72. Angiography in 2018 showed moderate left common iliac artery stenosis, short occlusion of right common femoral artery with no significant infrainguinal disease and short occlusion of the left common femoral artery with no significant infrainguinal disease.  I performed successful drug-coated balloon angioplasty and self-expanding stent placement to the right common femoral artery without complications.  She had no recurrent wounds since then. She developed severe restenosis in the right common femoral artery but remained asymptomatic and thus has been monitored closely.   She has been doing reasonably well with no recent chest pain.  She describes stable exertional dyspnea.  She has minimal leg claudication.   Past Medical History:  Diagnosis Date   AICD (automatic cardioverter/defibrillator) present 03/17/2017   Anxiety    Atrial tachycardia (HCC)    Bursitis of  shoulder, right, adhesive    CHF (congestive heart failure) (HCC)    Chronic bronchitis ([place])    "1-2 times/yr" (01/23/2014)   COPD (chronic obstructive pulmonary disease) (HCC)    CVD (cerebrovascular disease)    Dyslipidemia    Dysrhythmia    Frequency of urination    GERD (gastroesophageal reflux disease)    Heart murmur    History of stomach ulcers    HTN (hypertension) 02/22/2011   Migraines    "stopped many years ago" (06/14/2014)   Osteoporosis 08/19/2016   Pericarditis    Pneumonia "10 times" (06/14/2014)   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)    Silent myocardial infarction [facility]) "late 1990's"   Stress incontinence    "was suppose to have been tacked up years ago but I didn't do it"   Syncope, near    Associated with atrial tachycardia-event recorder 1/16   Thoracic outlet syndrome    VSD (ventricular septal defect)     Past Surgical History:  Procedure Laterality Date   ABDOMINAL AORTOGRAM W/LOWER EXTREMITY N/A 12/17/2016   Procedure: ABDOMINAL AORTOGRAM W/LOWER EXTREMITY;  Surgeon: Wellington Hampshire, MD;  Location: [place] [facility];  Service: Cardiovascular;  Laterality: N/A;   BIV ICD INSERTION CRT-D N/A 03/17/2017   Procedure: BIV ICD INSERTION CRT-D;  Surgeon: Evans Lance, MD;  Location: [place] [facility];  Service: Cardiovascular;  Laterality: N/A;   CARDIAC CATHETERIZATION  "quite a few"   [place]  1970's   CORONARY ANGIOGRAM  2000   No significant CAD   ELECTROPHYSIOLOGIC STUDY  N/A 06/14/2014   Procedure: A-Flutter/A-Tach/SVT Ablation;  Surgeon: Evans Lance, MD;  Location: [place] [facility];  Service: Cardiovascular;  Laterality: N/A;   INSERTION OF ICD  03/17/2017   BIV   MYRINGOTOMY WITH TUBE PLACEMENT Right 2015   PERIPHERAL VASCULAR INTERVENTION  12/17/2016   Procedure: PERIPHERAL VASCULAR INTERVENTION;  Surgeon: Wellington Hampshire, MD;  Location: [place] [facility];  Service:  Cardiovascular;;  Right common femoral PTA and Stent   RIGHT/LEFT HEART CATH AND CORONARY ANGIOGRAPHY Bilateral 11/06/2016   Procedure: RIGHT/LEFT HEART CATH AND CORONARY ANGIOGRAPHY;  Surgeon: Wellington Hampshire, MD;  Location: [place] [facility];  Service: Cardiovascular;  Laterality: Bilateral;   SVT ABLATION  06/14/2014   TUBAL LIGATION  1972   VSD REPAIR  1958; 1967     Current Outpatient Medications  Medication Sig Dispense Refill   albuterol (VENTOLIN HFA) 108 (90 Base) MCG/ACT inhaler Inhale 2 puffs into the lungs every 6 (six) hours as needed for wheezing or shortness of breath.     ALPRAZolam (XANAX) 0.5 MG tablet 1 tab by mouth twice per day as needed 60 tablet 2   aspirin 81 MG tablet Take 1 tablet (81 mg total) by mouth daily. 100 tablet 99   Budeson-Glycopyrrol-Formoterol (BREZTRI AEROSPHERE) 160-9-4.8 MCG/ACT AERO Inhale 2 puffs into the lungs 2 (two) times daily. 5.9 g 0   carvedilol (COREG) 12.5 MG tablet Take 0.5 tablets (6.25 mg total) by mouth 2 (two) times daily with a meal. (Patient taking differently: Take 6.25 mg by mouth daily.) 120 tablet 0   chlorpheniramine-HYDROcodone (TUSSIONEX PENNKINETIC ER) 10-8 MG/5ML SUER Take 5 mLs by mouth every 12 (twelve) hours as needed for cough. 180 mL 0   digoxin (LANOXIN) 0.125 MG tablet TAKE 1/2 TABLET BY MOUTH DAILY 45 tablet 3   ENTRESTO 24-26 MG TAKE 1 TABLET BY MOUTH TWICE A DAY 180 tablet 3   furosemide (LASIX) 20 MG tablet Take 20 mg by mouth daily as needed.     ibuprofen (ADVIL,MOTRIN) 200 MG tablet Take 200 mg by mouth daily as needed for headache.     rosuvastatin (CRESTOR) 10 MG tablet TAKE 1 TABLET BY MOUTH DAILY 90 tablet 1   spironolactone (ALDACTONE) 25 MG tablet TAKE ONE TABLET BY MOUTH AT BEDTIME (Patient taking differently: Take 12.5 mg by mouth daily.) 90 tablet 0   No current facility-administered medications for this visit.    Allergies:   Mupirocin and Codeine    Social History:  The patient  reports  that she has been smoking cigarettes. She has a 11.55 pack-year smoking history. She has never used smokeless tobacco. She reports previous alcohol use of about 1.0 standard drink of alcohol per week. She reports that she does not use drugs.   Family History:  The patient's family history includes Alcohol abuse in an other family member; Arthritis in some other family members; Cancer in her father, mother, and another family member; Hypertension in some other family members; Stroke in some other family members; Throat cancer in an other family member.    ROS:  Please see the history of present illness.   Otherwise, review of systems are positive for none.   All other systems are reviewed and negative.    PHYSICAL EXAM: VS:  BP (!) 100/50 (BP Location: Left Arm, Patient Position: Sitting, Cuff Size: Normal)   Pulse 64   Ht 5' 1.5" (1.562 m)   Wt 105 lb 8 oz (47.9 kg)   SpO2 98%  BMI 19.61 kg/m  , BMI Body mass index is 19.61 kg/m. GEN: Well nourished, well developed, in no acute distress  HEENT: normal  Neck: no JVD, carotid bruits, or masses Cardiac: RRR; no  rubs, or gallops,no edema. 2/f 6 holosystolic murmur at the left sternal border and apex. Respiratory:  clear to auscultation bilaterally, normal work of breathing GI: soft, nontender, nondistended, + BS MS: no deformity or atrophy  Skin: warm and dry, no rash Neuro:  Strength and sensation are intact Psych: euthymic mood, full affect Vascular: Radial pulse is absent bilaterally.    EKG:  EKG is ordered today. The ekg ordered today demonstrates dual-chamber biventricular pacemaker.   Recent Labs: 06/11/2020: ALT 10; BUN 16; Creatinine, Ser 1.08; Hemoglobin 13.0; Platelets 343.0; Potassium 4.8; Sodium 138; TSH 1.09    Lipid Panel    Component Value Date/Time   CHOL 150 06/11/2020 1551   TRIG 57.0 06/11/2020 1551   HDL 73.70 06/11/2020 1551   CHOLHDL 2 06/11/2020 1551   VLDL 11.4 06/11/2020 1551   LDLCALC 65 06/11/2020  1551   LDLDIRECT 112.6 03/16/2013 1057      Wt Readings from Last 3 Encounters:  08/21/20 105 lb 8 oz (47.9 kg)  07/27/20 106 lb (48.1 kg)  06/22/20 104 lb 3.2 oz (47.3 kg)       No flowsheet data found.    ASSESSMENT AND PLAN:  1.  Peripheral arterial disease .  Status post endovascular revascularization of the right common femoral artery with worsening proximal restenosis.  In spite of that, she continues to be asymptomatic.  Continue medical therapy.    2.  Chronic systolic heart failure: Due to nonischemic cardiomyopathy.  She appears to be euvolemic and currently on optimal medical therapy followed at the heart failure clinic.  She has been having issues with hypotension which improved with decreasing the heart failure medications.   3.  Hyperlipidemia: Continue treatment with rosuvastatin with a target LDL of less than 70.  Most recent lipid profile showed an LDL of 65.   4.  Tobacco use: I discussed the importance of smoking cessation.    Disposition:   FU with me in 6 months  Signed,  Kathlyn Sacramento, MD  08/21/2020 2:32 PM    [place]

## 2020-08-21 NOTE — Patient Instructions (Signed)
Medication Instructions:  Your physician recommends that you continue on your current medications as directed. Please refer to the Current Medication list given to you today.  *If you need a refill on your cardiac medications before your next appointment, please call your pharmacy*   Lab Work: None ordered If you have labs (blood work) drawn today and your tests are completely normal, you will receive your results only by: [place] (if you have MyChart) OR A paper copy in the mail If you have any lab test that is abnormal or we need to change your treatment, we will call you to review the results.   Testing/Procedures: None ordered   Follow-Up: At [facility], you and your health needs are our priority.  As part of our continuing mission to provide you with exceptional heart care, we have created designated Provider Care Teams.  These Care Teams include your primary Cardiologist (physician) and Advanced Practice Providers (APPs -  Physician Assistants and Nurse Practitioners) who all work together to provide you with the care you need, when you need it.  We recommend signing up for the patient portal called "MyChart".  Sign up information is provided on this After Visit Summary.  MyChart is used to connect with patients for Virtual Visits (Telemedicine).  Patients are able to view lab/test results, encounter notes, upcoming appointments, etc.  Non-urgent messages can be sent to your provider as well.   To learn more about what you can do with MyChart, go to [email or web address].    Your physician wants you to follow-up in: 6 months You will receive a reminder letter in the mail two months in advance. If you don't receive a letter, please call our office to schedule the follow-up appointment.   The format for your next appointment:   In Person  Provider:   You may see Kathlyn Sacramento, MD or one of the following Advanced Practice Providers on your designated Care Team:    Murray Hodgkins, NP Christell Faith, PA-C Marrianne Mood, PA-C Cadence Kathlen Mody, [place]   Other Instructions N/A

## 2020-09-07 ENCOUNTER — Other Ambulatory Visit: Payer: Self-pay | Admitting: Cardiovascular Disease

## 2020-09-17 ENCOUNTER — Ambulatory Visit (INDEPENDENT_AMBULATORY_CARE_PROVIDER_SITE_OTHER): Payer: Medicare Other

## 2020-09-17 DIAGNOSIS — I5022 Chronic systolic (congestive) heart failure: Secondary | ICD-10-CM | POA: Diagnosis not present

## 2020-09-17 LAB — CUP PACEART REMOTE DEVICE CHECK
Battery Remaining Longevity: 90 mo
Battery Remaining Percentage: 100 %
Brady Statistic RA Percent Paced: 43 %
Brady Statistic RV Percent Paced: 95 %
Date Time Interrogation Session: 20220829041200
HighPow Impedance: 77 Ohm
Implantable Lead Implant Date: 20190226
Implantable Lead Implant Date: 20190226
Implantable Lead Implant Date: 20190226
Implantable Lead Location: 753858
Implantable Lead Location: 753859
Implantable Lead Location: 753860
Implantable Lead Model: 292
Implantable Lead Model: 4671
Implantable Lead Model: 7740
Implantable Lead Serial Number: 444536
Implantable Lead Serial Number: 726363
Implantable Lead Serial Number: 805938
Implantable Pulse Generator Implant Date: 20190226
Lead Channel Impedance Value: 447 Ohm
Lead Channel Impedance Value: 532 Ohm
Lead Channel Impedance Value: 797 Ohm
Lead Channel Setting Pacing Amplitude: 2 V
Lead Channel Setting Pacing Amplitude: 2.5 V
Lead Channel Setting Pacing Amplitude: 2.6 V
Lead Channel Setting Pacing Pulse Width: 0.4 ms
Lead Channel Setting Pacing Pulse Width: 0.4 ms
Lead Channel Setting Sensing Sensitivity: 0.5 mV
Lead Channel Setting Sensing Sensitivity: 1 mV
Pulse Gen Serial Number: 204176

## 2020-09-26 ENCOUNTER — Ambulatory Visit (HOSPITAL_BASED_OUTPATIENT_CLINIC_OR_DEPARTMENT_OTHER)
Admission: RE | Admit: 2020-09-26 | Discharge: 2020-09-26 | Disposition: A | Discharge status: Home / Self Care | Payer: Medicare Other | Source: Ambulatory Visit | Attending: Cardiology | Admitting: Cardiology

## 2020-09-26 ENCOUNTER — Ambulatory Visit (HOSPITAL_COMMUNITY)
Admission: RE | Admit: 2020-09-26 | Discharge: 2020-09-26 | Disposition: A | Discharge status: Home / Self Care | Payer: Medicare Other | Source: Ambulatory Visit | Attending: Internal Medicine | Admitting: Internal Medicine

## 2020-09-26 ENCOUNTER — Other Ambulatory Visit: Payer: Self-pay

## 2020-09-26 ENCOUNTER — Encounter (HOSPITAL_COMMUNITY): Payer: Self-pay | Admitting: Cardiology

## 2020-09-26 VITALS — BP 100/58 | HR 61 | Wt 107.0 lb

## 2020-09-26 DIAGNOSIS — I2510 Atherosclerotic heart disease of native coronary artery without angina pectoris: Secondary | ICD-10-CM | POA: Insufficient documentation

## 2020-09-26 DIAGNOSIS — F17210 Nicotine dependence, cigarettes, uncomplicated: Secondary | ICD-10-CM | POA: Insufficient documentation

## 2020-09-26 DIAGNOSIS — I5022 Chronic systolic (congestive) heart failure: Secondary | ICD-10-CM

## 2020-09-26 DIAGNOSIS — Z7982 Long term (current) use of aspirin: Secondary | ICD-10-CM | POA: Diagnosis not present

## 2020-09-26 DIAGNOSIS — I110 Hypertensive heart disease with heart failure: Secondary | ICD-10-CM | POA: Diagnosis not present

## 2020-09-26 DIAGNOSIS — J449 Chronic obstructive pulmonary disease, unspecified: Secondary | ICD-10-CM | POA: Insufficient documentation

## 2020-09-26 DIAGNOSIS — Z95810 Presence of automatic (implantable) cardiac defibrillator: Secondary | ICD-10-CM | POA: Insufficient documentation

## 2020-09-26 DIAGNOSIS — I739 Peripheral vascular disease, unspecified: Secondary | ICD-10-CM | POA: Insufficient documentation

## 2020-09-26 DIAGNOSIS — Z79899 Other long term (current) drug therapy: Secondary | ICD-10-CM | POA: Diagnosis not present

## 2020-09-26 DIAGNOSIS — R0789 Other chest pain: Secondary | ICD-10-CM | POA: Insufficient documentation

## 2020-09-26 DIAGNOSIS — I420 Dilated cardiomyopathy: Secondary | ICD-10-CM | POA: Insufficient documentation

## 2020-09-26 DIAGNOSIS — Z7901 Long term (current) use of anticoagulants: Secondary | ICD-10-CM | POA: Insufficient documentation

## 2020-09-26 DIAGNOSIS — Z8616 Personal history of COVID-19: Secondary | ICD-10-CM | POA: Insufficient documentation

## 2020-09-26 DIAGNOSIS — E785 Hyperlipidemia, unspecified: Secondary | ICD-10-CM | POA: Insufficient documentation

## 2020-09-26 DIAGNOSIS — E782 Mixed hyperlipidemia: Secondary | ICD-10-CM

## 2020-09-26 DIAGNOSIS — Z09 Encounter for follow-up examination after completed treatment for conditions other than malignant neoplasm: Secondary | ICD-10-CM | POA: Diagnosis not present

## 2020-09-26 LAB — COMPREHENSIVE METABOLIC PANEL
ALT: 13 U/L (ref 0–44)
AST: 17 U/L (ref 15–41)
Albumin: 3.9 g/dL (ref 3.5–5.0)
Alkaline Phosphatase: 55 U/L (ref 38–126)
Anion gap: 5 (ref 5–15)
BUN: 13 mg/dL (ref 8–23)
CO2: 28 mmol/L (ref 22–32)
Calcium: 9.2 mg/dL (ref 8.9–10.3)
Chloride: 103 mmol/L (ref 98–111)
Creatinine, Ser: 0.86 mg/dL (ref 0.44–1.00)
GFR, Estimated: >60 mL/min (ref >60)
Glucose, Bld: 108 mg/dL — ABNORMAL HIGH (ref 70–99)
Potassium: 4.1 mmol/L (ref 3.5–5.1)
Sodium: 136 mmol/L (ref 135–145)
Total Bilirubin: 0.9 mg/dL (ref 0.3–1.2)
Total Protein: 6.7 g/dL (ref 6.5–8.1)

## 2020-09-26 LAB — ECHOCARDIOGRAM COMPLETE
Area-P 1/2: 3.66 cm2
Calc EF: 46 %
MV VTI: 1.92 cm2
S' Lateral: 5 cm
Single Plane A2C EF: 54.4 %
Single Plane A4C EF: 34.4 %

## 2020-09-26 LAB — DIGOXIN LEVEL: Digoxin Level: 0.7 ng/mL — ABNORMAL LOW (ref 0.8–2.0)

## 2020-09-26 MED ORDER — BISOPROLOL FUMARATE 5 MG PO TABS: 5.0000 mg | ORAL_TABLET | Freq: Every day | ORAL | 3 refills | Status: DC

## 2020-09-26 NOTE — Progress Notes (Signed)
PCP: Dr. Jenny Reichmann Cardiology: Dr. Harrington Challenger HF Cardiology: Dr. Aundra Dubin Cardiology/Vascular: Dr Fletcher Anon  75 y.o.with history of VSD repair as a child, COPD/active smoking, PAD, and chronic systolic CHF.  Patient has a long history of cardiomyopathy.  She had VSD repaired as a child and imaging has not showed residual VSD.  Cardiac MRI in 11/09 showed EF 36%.  Echo in 12/15 showed EF 40-45%.  Echo in 3/18 showed EF down to 15-20% with moderate RV systolic dysfunction.  RHC/LHC in 10/18 showed nonobstructive coronary but was concerning for low output (CI 1.33).  She had a nonhealing right lower extremity wound.  Peripheral angiogram with bilateral CFA occlusions and underwent DCB/self expanding stent to to right CFA.    CPX in 1/19 showed moderate to severe functional limitation, combination of HF and lung disease, probably more related to the lung disease.  PFTs in 12/18 showed severe COPD.   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    7/19 peripheral arterial dopplers showed significant in-stent restenosis right CFA stent.  Echo in 4/21 showed EF 35-40% with mild AI.  CPX in 5/21 showed severe COPD, mild-moderate HF limitation.  11/21 Cardiolite showed prior septal and apical infarction, no ischemia.   Had COVID April 2022. She has continued to recover.   Echo was done today and reviewed, EF 35-40% with basal-mid septal akinesis and anterior hypokinesis, no evidence for residual VSD, mildly decreased RV systolic function.   She is here today for 3 month f/u . Has been doing well from cardiac perspective. Works at the [employer] in [place] 3 days a week and gets in quite a bit of walking. May walk up to 5 miles in a day. No significant dyspnea if walking at a slower pace. Has occasional central CP that lasts 20-30 seconds when under emotional stress. Denies any exertional CP. No orthopnea, PND or LE edema. Weight up 3 lb from last visit which she attributes to increased caloric intake.    Denies claudication.  No wounds or resting pain.  Had cut back tobacco use, reports smoking more last 2 days d/t stress at home.  [name] device interrogation: Heartlogic Score 10, no AF  PMH: 1. VSD: s/p repair at [facility] as child.  From description, sounds like muscular VSD.  2. Atrial tachycardia s/p ablation.  3. Hyperlipidemia 4. COPD: Active smoker.  - PFTs (12/18): Severe obstructive lung disease.  5. Atrial fibrillation: Paroxysmal.   Noted only transiently.   6. PAD:  - Angiogram 11/18 showed totally occluded bilateral CFAs.  Patient had stent to right CFA.  ABIs in 12/18 were normal on right. - ABIs (10/20): ABI 0.8 right, 0.74 left - 12/21 dopplers with R CFA stent in-stenosis.  7. Chronic systolic CHF: Nonischemic cardiomyopathy.   - cMRI (11/09): EF 36%, VSD patch in anterior ventricular septum - Echo (12/15): EF 40-45% - Echo (3/18): EF 15-20%, moderate LV dilation, moderate MR, moderate RV dilation with moderately decreased systolic function.  - LHC/RHC (10/18): 3+ MR, EF < 25%, minimal nonobstructive CAD; PA 41/21, LVEDP 18, CI 1.33.  - CPX (1/19): peak VO2 13.4, VE/VCO2 slope 43, RER 1.07.  Mod-severe functional limitation due to combination of HF and lung disease, probably more related to lung disease.  - [name] CRT-D device.  - Echo (7/19): EF 25-30%, mild LV dilation, mild MR, normal RV size and systolic function.  - Echo (4/21): EF 35-40%, mild AI - CPX (5/21): severe COPD, mild-moderate HF limitation.  -  Cardiolite (11/21): EF 47%, prior septal and apical infarction, no ischemia. - Echo (9/22): EF 35-40% with basal-mid septal akinesis and anterior hypokinesis, no evidence for residual VSD, mildly decreased RV systolic function.  8. PVCs: Zio patch 9/19 with 6% PVCs.  9. COVID-19 (4/22)  SH: Smoker, lives in [place], married.  FH: Mother with PAD, sister died at birth from congenital heart disease.   ROS: All systems reviewed and  negative except as per HPI.   Current Outpatient Medications  Medication Sig Dispense Refill   albuterol (VENTOLIN HFA) 108 (90 Base) MCG/ACT inhaler Inhale 2 puffs into the lungs every 6 (six) hours as needed for wheezing or shortness of breath.     ALPRAZolam (XANAX) 0.5 MG tablet 1 tab by mouth twice per day as needed 60 tablet 2   aspirin 81 MG tablet Take 1 tablet (81 mg total) by mouth daily. 100 tablet 99   Budeson-Glycopyrrol-Formoterol (BREZTRI AEROSPHERE) 160-9-4.8 MCG/ACT AERO Inhale 2 puffs into the lungs 2 (two) times daily. 5.9 g 0   carvedilol (COREG) 6.25 MG tablet Take 3.125 mg by mouth 2 (two) times daily with a meal.     chlorpheniramine-HYDROcodone (TUSSIONEX PENNKINETIC ER) 10-8 MG/5ML SUER Take 5 mLs by mouth every 12 (twelve) hours as needed for cough. 180 mL 0   digoxin (LANOXIN) 0.125 MG tablet TAKE 1/2 TABLET BY MOUTH DAILY 45 tablet 3   ENTRESTO 24-26 MG TAKE 1 TABLET BY MOUTH TWICE A DAY 180 tablet 3   furosemide (LASIX) 20 MG tablet Take 20 mg by mouth daily as needed.     ibuprofen (ADVIL,MOTRIN) 200 MG tablet Take 200 mg by mouth daily as needed for headache.     rosuvastatin (CRESTOR) 10 MG tablet TAKE 1 TABLET BY MOUTH DAILY 90 tablet 1   spironolactone (ALDACTONE) 25 MG tablet Take 12.5 mg by mouth daily.     No current facility-administered medications for this encounter.   BP (!) 100/58   Pulse 61   Wt 48.5 kg (107 lb)   SpO2 98%   BMI 19.89 kg/m   Wt Readings from Last 3 Encounters:  09/26/20 48.5 kg (107 lb)  08/21/20 47.9 kg (105 lb 8 oz)  07/27/20 48.1 kg (106 lb)    General:  Thin, elderly female. Well appearing. No resp difficulty HEENT: normal Neck: supple. no JVD. Carotids 2+ bilat; no bruits. No lymphadenopathy or thryomegaly appreciated. Cor: PMI nondisplaced. Regular rate & rhythm. 2/6 systolic murmur Lungs: diminshed Abdomen: soft, nontender, nondistended. No hepatosplenomegaly. No bruits or masses. Good bowel sounds. Extremities:  no cyanosis, clubbing, rash, edema Neuro: alert & orientedx3, cranial nerves grossly intact. moves all 4 extremities w/o difficulty. Affect pleasant   Assessment/Plan: 1.  PAD: She had endovascular intervention to occluded right CFA in 11/18, but subsequent arterial showed in-stent restenosis. She has occluded left CFA also that was not intervened on. Minimal claudication and no wounds/rest pain. She is still smoking. - Medical management in absence significant symptoms.  - Continue statin.  - Discussed smoking cessation.  - Followed by Dr Fletcher Anon  2. CAD: Nonobstructive on 10/18 cath. She has atypical chest pain at times with stress.  Symptoms are very brief and do not appear to be consistent with angina.  - Cardiolite in 11/21 with no evidence of ischemia.  - She is on ASA 81 and statin.  3. Hyperlipidemia:  - LDL has been at goal. Continue crestor. 4. COPD: Severe by PFTs on 5/21 CPX. COPD plays a significant role  in her dyspnea. She has started back to smoking a couple cigarettes/day.  - Needs to quit smoking completely, as above, recommend nicotine patch.  6. Chronic systolic CHF: Nonischemic cardiomyopathy.  RHC/LHC in 10/18 with no nonobstructive coronary disease but CI was very low at 1.33.  Her low cardiac output was out of proportion to her symptoms.  [name] CRT-D device.  CPX in 5/21 showed severe COPD but only mild-moderate HF limitation.   - EF stable at 35-40% on echo today  Heart Logic Score 10 - NYHA II. Euvolemic. Takes 20 mg furosemide as needed - Continue Entresto 24/26 bid. .   - Switch coreg to bisoprolol 5 mg daily d/t underlying COPD  - Continue spironolactone 12.5 mg daily. Unable to tolerate higher doses. - Continue digoxin - Unable to tolerate Farxiga.  - CMET and dig level today  Follow up 3 months with Dr. Ronnell Freshwater, PA-C 09/26/2020  Patient seen with PA, agree with the above note.   She has episodes of atypical chest pain with  emotional upset.  Same dyspnea with moderate exertion like vacuuming.  Occasional wheezing.   Echo done today as above, EF stable 35-40%.   General: NAD Neck: No JVD, no thyromegaly or thyroid nodule.  Lungs: Distant BS and occasional rhonchi.  CV: Nondisplaced PMI.  Heart regular S1/S2, no S3/S4, no murmur.  No peripheral edema.  No carotid bruit.  Normal pedal pulses.  Abdomen: Soft, nontender, no hepatosplenomegaly, no distention.  Skin: Intact without lesions or rashes.  Neurologic: Alert and oriented x 3.  Psych: Normal affect. Extremities: No clubbing or cyanosis.  HEENT: Normal.   I think that COPD plays a large role in her dyspnea.  She is not volume overloaded on exam.  Today, given significant COPD, will stop Coreg and start bisoprolol 5 mg daily (more beta-1 selective). Encouraged her to continue to work on smoking cessation.  Other meds will remain the same.  Check BMET and digoxin level today.  Followup in 3 months.   Loralie Champagne 09/26/2020

## 2020-09-26 NOTE — Patient Instructions (Addendum)
Stop carvedilol. Try taking 1 tablet of bisoprolol daily instead. Call if you have trouble with this new medicine.   We are getting labs today.   Labs done today, your results will be available in MyChart, we will contact you for abnormal readings.  Your physician recommends that you schedule a follow-up appointment in: 3 months  If you have any questions or concerns before your next appointment please send [place] a message through [place] or call our office at [phone].    TO LEAVE A MESSAGE FOR THE NURSE SELECT OPTION 2, PLEASE LEAVE A MESSAGE INCLUDING: YOUR NAME DATE OF BIRTH CALL BACK NUMBER REASON FOR CALL**this is important as we prioritize the call backs  YOU WILL RECEIVE A CALL BACK THE SAME DAY AS LONG AS YOU CALL BEFORE 4:00 PM  At the [facility], you and your health needs are our priority. As part of our continuing mission to provide you with exceptional heart care, we have created designated Provider Care Teams. These Care Teams include your primary Cardiologist (physician) and Advanced Practice Providers (APPs- Physician Assistants and Nurse Practitioners) who all work together to provide you with the care you need, when you need it.   You may see any of the following providers on your designated Care Team at your next follow up: Dr Glori Bickers Dr Loralie Champagne Dr Patrice Paradise, NP Lyda Jester, [place] Ginnie Smart Audry Riles, PharmD   Please be sure to bring in all your medications bottles to every appointment.

## 2020-09-27 NOTE — Progress Notes (Signed)
Remote ICD transmission.   

## 2020-10-08 ENCOUNTER — Encounter: Payer: Medicare Other | Attending: Physician Assistant | Admitting: Physician Assistant

## 2020-10-08 ENCOUNTER — Other Ambulatory Visit: Payer: Self-pay

## 2020-10-08 ENCOUNTER — Other Ambulatory Visit: Payer: Self-pay | Admitting: Internal Medicine

## 2020-10-08 DIAGNOSIS — F17218 Nicotine dependence, cigarettes, with other nicotine-induced disorders: Secondary | ICD-10-CM | POA: Insufficient documentation

## 2020-10-08 DIAGNOSIS — I890 Lymphedema, not elsewhere classified: Secondary | ICD-10-CM | POA: Insufficient documentation

## 2020-10-08 DIAGNOSIS — S81811A Laceration without foreign body, right lower leg, initial encounter: Secondary | ICD-10-CM | POA: Insufficient documentation

## 2020-10-08 DIAGNOSIS — L97819 Non-pressure chronic ulcer of other part of right lower leg with unspecified severity: Secondary | ICD-10-CM | POA: Diagnosis present

## 2020-10-08 DIAGNOSIS — L97112 Non-pressure chronic ulcer of right thigh with fat layer exposed: Secondary | ICD-10-CM | POA: Diagnosis not present

## 2020-10-08 DIAGNOSIS — W541XXA Struck by dog, initial encounter: Secondary | ICD-10-CM | POA: Insufficient documentation

## 2020-10-08 DIAGNOSIS — L988 Other specified disorders of the skin and subcutaneous tissue: Secondary | ICD-10-CM | POA: Diagnosis not present

## 2020-10-08 DIAGNOSIS — C44722 Squamous cell carcinoma of skin of right lower limb, including hip: Secondary | ICD-10-CM | POA: Diagnosis not present

## 2020-10-08 DIAGNOSIS — I739 Peripheral vascular disease, unspecified: Secondary | ICD-10-CM | POA: Diagnosis not present

## 2020-10-09 NOTE — Progress Notes (Addendum)
Kimberly [name], Kimberly [name] ([id number]) Visit Report for 10/08/2020 Chief Complaint Document Details Patient Name: Kimberly [name], Kimberly [name] Date of Service: 10/08/2020 12:45 PM Medical Record Number: [id number] Patient Account Number: [id number] Date of Birth/Sex: 08-25-1945 (75 y.o. F) Treating RN: Dolan Amen Primary Care Provider: Cathlean Cower Other Clinician: Referring Provider: Referral, Self Treating Provider/Extender: Skipper Cliche in Treatment: 0 Information Obtained from: Patient Chief Complaint Right Upper Leg Ulcer Electronic Signature(s) Signed: 10/08/2020 1:54:04 PM By: Worthy Keeler PA-C Entered By: Worthy Keeler on 10/08/2020 13:54:04 Kimberly [name] ([id number]) -------------------------------------------------------------------------------- Debridement Details Patient Name: Kimberly [name] Date of Service: 10/08/2020 12:45 PM Medical Record Number: [id number] Patient Account Number: [id number] Date of Birth/Sex: 07-Dec-1945 (75 y.o. F) Treating RN: Dolan Amen Primary Care Provider: Cathlean Cower Other Clinician: Referring Provider: Referral, Self Treating Provider/Extender: Skipper Cliche in Treatment: 0 Debridement Performed for Wound #3 Right Upper Leg Assessment: Performed By: Physician Tommie Sams., PA-C Debridement Type: Debridement Level of Consciousness (Pre- Awake and Alert procedure): Pre-procedure Verification/Time Out Yes - 14:08 Taken: Start Time: 14:08 Total Area Debrided (L x W): 1 (cm) x 1 (cm) = 1 (cm) Tissue and other material Viable, Subcutaneous debrided: Level: Skin/Subcutaneous Tissue Debridement Description: Excisional Instrument: Forceps, Scissors Specimen: Tissue Culture Number of Specimens Taken: 1 Bleeding: Moderate Hemostasis Achieved: Silver Nitrate Response to Treatment: Procedure was tolerated well Level of Consciousness (Post- Awake and Alert procedure): Post Debridement Measurements of Total Wound Length: (cm) 1 Width: (cm)  1 Depth: (cm) 0.2 Volume: (cm) 0.157 Character of Wound/Ulcer Post Debridement: Stable Post Procedure Diagnosis Same as Pre-procedure Electronic Signature(s) Signed: 10/08/2020 4:52:51 PM By: Dolan Amen RN Signed: 10/08/2020 6:26:07 PM By: Worthy Keeler PA-C Entered By: Dolan Amen on 10/08/2020 14:24:08 Kimberly [name] ([id number]) -------------------------------------------------------------------------------- HPI Details Patient Name: Kimberly [name] Date of Service: 10/08/2020 12:45 PM Medical Record Number: [id number] Patient Account Number: [id number] Date of Birth/Sex: [birth year] (75 y.o. F) Treating RN: Dolan Amen Primary Care Provider: Cathlean Cower Other Clinician: Referring Provider: Referral, Self Treating Provider/Extender: Skipper Cliche in Treatment: 0 History of Present Illness HPI Description: 75 year old patient was seen by the nurse practitioner at the [facility] primary care for a wound which has been there on her right leg since August 2018. For a period of time she has been treated with doxycycline, clindamycin and Bactrim and the wound continues to have drainage and odor.on 11/20/2016 she was again put on Bactrim DS and referred to the wound center. Past medical history significant for hypertension, congestive heart failure, COPD, dyslipidemia, migraines, pericarditis, status post VSD repair, cholecystectomy tubal ligation, cesarean section, coronary angiogram,my tenotomy with tube placement on the right,dilated cardiomyopathy,tobacco abuse and has been a smoker for the last 35 years. She also has a history of right great saphenous vein ablation but the patient does not confirm this for sure. She has had a venous duplex study done in August 2018 which showed no evidence of lower extremity deep or superficial venous thrombus or incompetence bilaterally. She has a arterial duplex study pending, which is going to be done later today. 12/22/16 on evaluation  today patient appears to be doing fairly well in regard to her right lateral lower extremity wounds. She did undergo a right common femoral artery balloon angioplasty and stent placement which was performed on 12/17/16. Patient this is did appear to be a sufficient improvement according to the reviewed notes which is excellent news. Prior to this procedure patient did have a study of the  lower extremity this was a Doppler performed on 12/08/16 this revealed moderate right lower extremity arterial disease involving the common femoral artery and iliac segment. There was also left arterial obstruction involving the iliac segment. On the left there was arterial obstruction involving the iliac segment, common femoral artery, and peroneal artery. Patient states she is still having some soreness left over and residual from the procedure. However overall she appears to be doing very well. She does have a follow-up study with vascular in the next week. 01/02/2017 -- was seen by Dr. Jacqulyn Cane -- she had an abdominal aortogram with lower extremity peripheral vascular intervention, and this showed moderate left common iliac artery stenosis. Short occlusion of the right common femoral artery with no significant infrainguinal disease. Short occlusion of the left common femoral artery with no significant infrainguinal disease and a successful drug-coated balloon angioplasty stent was placed in the right common femoral artery. She had a lower extremity arterial duplex examination done which showed the resting right ABI is within normal range and no evidence of significant right lower extremity arterial disease. right ABI is 1.08 with a toe pressure being 0.54. there was biphasic flow. The left ABI was 0.77 and the toe and this was 0.38 with monophasic flow.The right toe brachial indicis were abnormal. The left resting ABI indicates moderate left lower extremity arterial disease in the left toe brachial index is  abnormal. 01/09/17 on evaluation today patient presents with continued ulcerations noted of the right lower extremity. This does seem to be doing a little bit better but still is slough covered. She has been tolerating the dressing changes fairly well but states that the debridement does tend to bother her pain wise. No fevers, chills, nausea, or vomiting noted at this time. She does tell me that the alginate dressing that she was looking to use following her last evaluation is expensive and insurance will not cover this it's actually cheaper for her to utilize the [place]. 02/06/17 on evaluation today patient's wound appears to be doing excellent at this point in time. In fact she has been tolerating the dressing changes very well there does not even appear to be any evidence or need for debridement today. She's not having any significant pain which is good news. No fevers, chills, nausea, or vomiting noted at this time. Overall she is extremely pleased with how things have progressed. 02/20/17 on evaluation today patient appears to be doing well in regard to her right lower extremity ulcer. In fact this appears to be completely healed. She still has some dry pinpoint scab like areas noted over the lower extremity but fortunately nothing significant and none of these seem to have any wound underneath when they come off it is completely healed underneath. Nonetheless we are gonna let this work itself off not try to do anything to invasive. 03/06/17 on evaluation today patient appears to be doing extremely well in regard to her right lateral lower extremity ulcer site. She appears to be completely healed and there is no evidence of residual ulceration at this time which is great news. Readmission: 10/08/2020 upon evaluation today patient appears to be doing somewhat poorly in regard to a wound on her right leg. She tells me about a month ago one of her dogs scratched her and then subsequently this developed  into a raised area that she has been concerned with. Fortunately there does not appear to be any pain per se with regard to this area. Unfortunately there is some  concern in my mind to be honest about the possibility of a cancerous lesion here. Initially the plan was to actually do a punch biopsy as I proceeded with the procedure it became apparent that that was not necessarily going to be the best way to go I ended up removing the entirety of the raised area. It was somewhat coming loose just after removing the punch anyway. . The patient does have peripheral vascular disease though she recently had a stent placed. She also has a defibrillator pacemaker at this point. She does have lymphedema and does continue to smoke. She has no desire to quit. Electronic Signature(s) Signed: 10/08/2020 6:06:26 PM By: Worthy Keeler PA-C Entered By: Worthy Keeler on 10/08/2020 18:06:26 Kimberly [name] ([id number]) Kimberly [name] ([id number]) -------------------------------------------------------------------------------- Physical Exam Details Patient Name: Kimberly [name] Date of Service: 10/08/2020 12:45 PM Medical Record Number: [id number] Patient Account Number: [id number] Date of Birth/Sex: November 20, 1945 (75 y.o. F) Treating RN: Dolan Amen Primary Care Provider: Cathlean Cower Other Clinician: Referring Provider: Referral, Self Treating Provider/Extender: Skipper Cliche in Treatment: 0 Constitutional sitting or standing blood pressure is within target range for patient.. pulse regular and within target range for patient.Marland Kitchen respirations regular, non- labored and within target range for patient.Marland Kitchen temperature within target range for patient.. Well-nourished and well-hydrated in no acute distress. Eyes conjunctiva clear no eyelid edema noted. pupils equal round and reactive to light and accommodation. Respiratory normal breathing without difficulty. Cardiovascular 1+ dorsalis pedis/posterior tibialis  pulses. no clubbing, cyanosis, significant edema, <3 sec cap refill. Musculoskeletal normal gait and posture. no significant deformity or arthritic changes, no loss or range of motion, no clubbing. Psychiatric this patient is able to make decisions and demonstrates good insight into disease process. Alert and Oriented x 3. pleasant and cooperative. Notes Upon inspection again I did obtain a punch biopsy with a 5 mm punch initially. At actually was able to get the sample and place it for pathology when subsequently there ended up being a area that was somewhat lifting up around where I did the punch anyway. The skin was very fragile and started lifting away. Subsequently in the end I was able to actually remove the entirety of the raised area that does bring in the question whether or not this may have been something such as a sebaceous cyst. Either way I am definitely feeling like that pathology is the right thing to do here and we will see what that shows. Electronic Signature(s) Signed: 10/08/2020 6:08:50 PM By: Worthy Keeler PA-C Entered By: Worthy Keeler on 10/08/2020 18:08:50 Kimberly [name] ([id number]) -------------------------------------------------------------------------------- Physician Orders Details Patient Name: Kimberly [name] Date of Service: 10/08/2020 12:45 PM Medical Record Number: [id number] Patient Account Number: [id number] Date of Birth/Sex: 1945/05/24 (75 y.o. F) Treating RN: Dolan Amen Primary Care Provider: Cathlean Cower Other Clinician: Referring Provider: Referral, Self Treating Provider/Extender: Skipper Cliche in Treatment: 0 Verbal / Phone Orders: No Diagnosis Coding ICD-10 Coding Code Description [phone] Laceration without foreign body, right lower leg, initial encounter I89.0 Lymphedema, not elsewhere classified I73.9 Peripheral vascular disease, unspecified F17.218 Nicotine dependence, cigarettes, with other nicotine-induced disorders Follow-up  Appointments o Return Appointment in 1 week. Bathing/ Shower/ Hygiene o May shower; gently cleanse wound with antibacterial soap, rinse and pat dry prior to dressing wounds Wound Treatment Wound #3 - Upper Leg Wound Laterality: Right Cleanser: [place] Ancillary Kit - 15 Day Supply (DME) (Generic) Every Other Day/30 Days Discharge Instructions: Use supplies as instructed; Kit  contains: (15) Saline Bullets; (15) 3x3 Gauze; 15 pr Gloves Cleanser: Soap and Water Every Other Day/30 Days Discharge Instructions: Gently cleanse wound with antibacterial soap, rinse and pat dry prior to dressing wounds Primary Dressing: Silvercel Small 2x2 (in/in) (DME) (Generic) Every Other Day/30 Days Discharge Instructions: Apply Silvercel Small 2x2 (in/in) as instructed Primary Dressing: Zetuvit Plus Silicone Border Dressing 4x4 (in/in) (DME) (Generic) Every Other Day/30 Days Discharge Instructions: Secure silver alginate Laboratory o Bacteria identified in Tissue by Biopsy culture (MICRO) oooo LOINC Code: 73419-3 oooo Convenience Name: Biopsy specimen culture Electronic Signature(s) Signed: 10/11/2020 11:56:14 AM By: Dolan Amen RN Signed: 10/11/2020 2:21:21 PM By: Worthy Keeler PA-C Previous Signature: 10/08/2020 4:52:51 PM Version By: Dolan Amen RN Previous Signature: 10/08/2020 6:26:07 PM Version By: Worthy Keeler PA-C Entered By: Dolan Amen on 10/11/2020 11:56:14 Kimberly [name] ([id number]) -------------------------------------------------------------------------------- Problem List Details Patient Name: Kimberly [name] Date of Service: 10/08/2020 12:45 PM Medical Record Number: [id number] Patient Account Number: [id number] Date of Birth/Sex: 1945/10/17 (75 y.o. F) Treating RN: Dolan Amen Primary Care Provider: Cathlean Cower Other Clinician: Referring Provider: Referral, Self Treating Provider/Extender: Skipper Cliche in Treatment: 0 Active Problems ICD-10 Encounter Code  Description Active Date MDM Diagnosis S81.811A Laceration without foreign body, right lower leg, initial encounter 10/08/2020 No Yes I89.0 Lymphedema, not elsewhere classified 10/08/2020 No Yes I73.9 Peripheral vascular disease, unspecified 10/08/2020 No Yes F17.218 Nicotine dependence, cigarettes, with other nicotine-induced disorders 10/08/2020 No Yes Inactive Problems Resolved Problems Electronic Signature(s) Signed: 10/08/2020 1:53:38 PM By: Worthy Keeler PA-C Entered By: Worthy Keeler on 10/08/2020 13:53:38 Kimberly [name] ([id number]) -------------------------------------------------------------------------------- Progress Note Details Patient Name: Kimberly [name] Date of Service: 10/08/2020 12:45 PM Medical Record Number: [id number] Patient Account Number: [id number] Date of Birth/Sex: 1945/12/02 (75 y.o. F) Treating RN: Dolan Amen Primary Care Provider: Cathlean Cower Other Clinician: Referring Provider: Referral, Self Treating Provider/Extender: Skipper Cliche in Treatment: 0 Subjective Chief Complaint Information obtained from Patient Right Upper Leg Ulcer History of Present Illness (HPI) 75 year old patient was seen by the nurse practitioner at the [facility] primary care for a wound which has been there on her right leg since August 2018. For a period of time she has been treated with doxycycline, clindamycin and Bactrim and the wound continues to have drainage and odor.on 11/20/2016 she was again put on Bactrim DS and referred to the wound center. Past medical history significant for hypertension, congestive heart failure, COPD, dyslipidemia, migraines, pericarditis, status post VSD repair, cholecystectomy tubal ligation, cesarean section, coronary angiogram,my tenotomy with tube placement on the right,dilated cardiomyopathy,tobacco abuse and has been a smoker for the last 35 years. She also has a history of right great saphenous vein ablation but the patient does not  confirm this for sure. She has had a venous duplex study done in August 2018 which showed no evidence of lower extremity deep or superficial venous thrombus or incompetence bilaterally. She has a arterial duplex study pending, which is going to be done later today. 12/22/16 on evaluation today patient appears to be doing fairly well in regard to her right lateral lower extremity wounds. She did undergo a right common femoral artery balloon angioplasty and stent placement which was performed on 12/17/16. Patient this is did appear to be a sufficient improvement according to the reviewed notes which is excellent news. Prior to this procedure patient did have a study of the lower extremity this was a Doppler performed on 12/08/16 this revealed moderate right lower  extremity arterial disease involving the common femoral artery and iliac segment. There was also left arterial obstruction involving the iliac segment. On the left there was arterial obstruction involving the iliac segment, common femoral artery, and peroneal artery. Patient states she is still having some soreness left over and residual from the procedure. However overall she appears to be doing very well. She does have a follow-up study with vascular in the next week. 01/02/2017 -- was seen by Dr. Jacqulyn Cane -- she had an abdominal aortogram with lower extremity peripheral vascular intervention, and this showed moderate left common iliac artery stenosis. Short occlusion of the right common femoral artery with no significant infrainguinal disease. Short occlusion of the left common femoral artery with no significant infrainguinal disease and a successful drug-coated balloon angioplasty stent was placed in the right common femoral artery. She had a lower extremity arterial duplex examination done which showed the resting right ABI is within normal range and no evidence of significant right lower extremity arterial disease. right ABI is 1.08  with a toe pressure being 0.54. there was biphasic flow. The left ABI was 0.77 and the toe and this was 0.38 with monophasic flow.The right toe brachial indicis were abnormal. The left resting ABI indicates moderate left lower extremity arterial disease in the left toe brachial index is abnormal. 01/09/17 on evaluation today patient presents with continued ulcerations noted of the right lower extremity. This does seem to be doing a little bit better but still is slough covered. She has been tolerating the dressing changes fairly well but states that the debridement does tend to bother her pain wise. No fevers, chills, nausea, or vomiting noted at this time. She does tell me that the alginate dressing that she was looking to use following her last evaluation is expensive and insurance will not cover this it's actually cheaper for her to utilize the [place]. 02/06/17 on evaluation today patient's wound appears to be doing excellent at this point in time. In fact she has been tolerating the dressing changes very well there does not even appear to be any evidence or need for debridement today. She's not having any significant pain which is good news. No fevers, chills, nausea, or vomiting noted at this time. Overall she is extremely pleased with how things have progressed. 02/20/17 on evaluation today patient appears to be doing well in regard to her right lower extremity ulcer. In fact this appears to be completely healed. She still has some dry pinpoint scab like areas noted over the lower extremity but fortunately nothing significant and none of these seem to have any wound underneath when they come off it is completely healed underneath. Nonetheless we are gonna let this work itself off not try to do anything to invasive. 03/06/17 on evaluation today patient appears to be doing extremely well in regard to her right lateral lower extremity ulcer site. She appears to be completely healed and there is no  evidence of residual ulceration at this time which is great news. Readmission: 10/08/2020 upon evaluation today patient appears to be doing somewhat poorly in regard to a wound on her right leg. She tells me about a month ago one of her dogs scratched her and then subsequently this developed into a raised area that she has been concerned with. Fortunately there does not appear to be any pain per se with regard to this area. Unfortunately there is some concern in my mind to be honest about the possibility of a cancerous lesion  here. Initially the plan was to actually do a punch biopsy as I proceeded with the procedure it became apparent that that was not necessarily going to be the best way to go I ended up removing the entirety of the raised area. It was somewhat coming loose just after removing the punch anyway. . The patient does have peripheral vascular disease though she recently had a stent placed. She also has a defibrillator pacemaker at this point. She does have lymphedema and does continue to smoke. She has no desire to quit. Kimberly [name], Kimberly [name] ([id number]) Patient History Information obtained from Patient. Allergies No Known Drug Allergies Family History Cancer - Father, Heart Disease - Siblings, Hypertension - Mother, Tuberculosis - Father, No family history of Diabetes, Hereditary Spherocytosis, Kidney Disease, Lung Disease, Seizures, Stroke, Thyroid Problems. Social History Current every day smoker, Marital Status - Married, Alcohol Use - Never, Drug Use - No History, Caffeine Use - Daily. Medical History Hematologic/Lymphatic Patient has history of Anemia - hx Respiratory Patient has history of Asthma, Chronic Obstructive Pulmonary Disease (COPD) Cardiovascular Patient has history of Congestive Heart Failure, Hypertension, Peripheral Arterial Disease Review of Systems (ROS) Constitutional Symptoms (General Health) Denies complaints or symptoms of Fatigue, Fever, Chills, Marked  Weight Change. Eyes Denies complaints or symptoms of Dry Eyes, Vision Changes, Glasses / Contacts. Ear/Nose/Mouth/Throat Denies complaints or symptoms of Difficult clearing ears, Sinusitis. Hematologic/Lymphatic Denies complaints or symptoms of Bleeding / Clotting Disorders, Human Immunodeficiency Virus. Respiratory Denies complaints or symptoms of Chronic or frequent coughs, Shortness of Breath. Cardiovascular Denies complaints or symptoms of Chest pain, LE edema. Gastrointestinal Denies complaints or symptoms of Frequent diarrhea, Nausea, Vomiting. Endocrine Denies complaints or symptoms of Hepatitis, Thyroid disease, Polydypsia (Excessive Thirst). Genitourinary Denies complaints or symptoms of Kidney failure/ Dialysis, Incontinence/dribbling. Immunological Denies complaints or symptoms of Hives, Itching. Integumentary (Skin) Complains or has symptoms of Wounds. Denies complaints or symptoms of Bleeding or bruising tendency, Breakdown, Swelling. Musculoskeletal Denies complaints or symptoms of Muscle Pain, Muscle Weakness. Neurologic Denies complaints or symptoms of Numbness/parasthesias, Focal/Weakness. Psychiatric Denies complaints or symptoms of Anxiety, Claustrophobia. Objective Constitutional sitting or standing blood pressure is within target range for patient.. pulse regular and within target range for patient.Marland Kitchen respirations regular, non- labored and within target range for patient.Marland Kitchen temperature within target range for patient.. Well-nourished and well-hydrated in no acute distress. Vitals Time Taken: 1:14 PM, Height: 61 in, Source: Stated, Weight: 106 lbs, Source: Measured, BMI: 20, Temperature: 98 F, Pulse: 59 bpm, Respiratory Rate: 16 breaths/min, Blood Pressure: 103/66 mmHg. Eyes conjunctiva clear no eyelid edema noted. pupils equal round and reactive to light and accommodation. Respiratory normal breathing without difficulty. Cardiovascular Kimberly [name], Kimberly B.  ([id number]) 1+ dorsalis pedis/posterior tibialis pulses. no clubbing, cyanosis, significant edema, Musculoskeletal normal gait and posture. no significant deformity or arthritic changes, no loss or range of motion, no clubbing. Psychiatric this patient is able to make decisions and demonstrates good insight into disease process. Alert and Oriented x 3. pleasant and cooperative. General Notes: Upon inspection again I did obtain a punch biopsy with a 5 mm punch initially. At actually was able to get the sample and place it for pathology when subsequently there ended up being a area that was somewhat lifting up around where I did the punch anyway. The skin was very fragile and started lifting away. Subsequently in the end I was able to actually remove the entirety of the raised area that does bring in the question whether or not this may have been  something such as a sebaceous cyst. Either way I am definitely feeling like that pathology is the right thing to do here and we will see what that shows. Integumentary (Hair, Skin) Wound #3 status is Open. Original cause of wound was Other Lesion. The date acquired was: 09/07/2020. The wound is located on the Right Upper Leg. The wound measures 1cm length x 1cm width x 0.1cm depth; 0.785cm^2 area and 0.079cm^3 volume. There is Fat Layer (Subcutaneous Tissue) exposed. There is no tunneling or undermining noted. There is a medium amount of sanguinous drainage noted. There is large (67-100%) red granulation within the wound bed. There is no necrotic tissue within the wound bed. Assessment Active Problems ICD-10 Laceration without foreign body, right lower leg, initial encounter Lymphedema, not elsewhere classified Peripheral vascular disease, unspecified Nicotine dependence, cigarettes, with other nicotine-induced disorders Procedures Wound #3 Pre-procedure diagnosis of Wound #3 is a Lesion located on the Right Upper Leg . There was a Excisional  Skin/Subcutaneous Tissue Debridement with a total area of 1 sq cm performed by Tommie Sams., PA-C. With the following instrument(s): Forceps, and Scissors to remove Viable tissue/material. Material removed includes Subcutaneous Tissue. 1 specimen was taken by a Tissue Culture and sent to the lab per facility protocol. A time out was conducted at 14:08, prior to the start of the procedure. A Moderate amount of bleeding was controlled with Silver Nitrate. The procedure was tolerated well. Post Debridement Measurements: 1cm length x 1cm width x 0.2cm depth; 0.157cm^3 volume. Character of Wound/Ulcer Post Debridement is stable. Post procedure Diagnosis Wound #3: Same as Pre-Procedure Plan Follow-up Appointments: Return Appointment in 1 week. Bathing/ Shower/ Hygiene: May shower; gently cleanse wound with antibacterial soap, rinse and pat dry prior to dressing wounds WOUND #3: - Upper Leg Wound Laterality: Right Cleanser: [employer]ram Ancillary Kit - 15 Day Supply (DME) (Generic) Every Other Day/30 Days Discharge Instructions: Use supplies as instructed; Kit contains: (15) Saline Bullets; (15) 3x3 Gauze; 15 pr Gloves Cleanser: Soap and Water Every Other Day/30 Days Discharge Instructions: Gently cleanse wound with antibacterial soap, rinse and pat dry prior to dressing wounds Primary Dressing: Silvercel Small 2x2 (in/in) (DME) (Generic) Every Other Day/30 Days Discharge Instructions: Apply Silvercel Small 2x2 (in/in) as instructed Primary Dressing: Zetuvit Plus Silicone Border Dressing 4x4 (in/in) (DME) (Generic) Every Other Day/30 Days Discharge Instructions: Secure silver alginate 1. Would recommend currently that based on what we are seeing we use a silver alginate dressing I think this probably can to be the best way to go right now and she is in agreement with that plan. Kimberly [name], Kimberly B. ([id number]) 2. I am also can recommend that we await the results of the pathology and make any adjustments in  care as necessary. Again my initial assessment was somewhat concerning for the possibility of a skin cancer. However after the biopsy and subsequently removal of the tissue around to send for pathology I am more leaning towards a sebaceous cyst or something along those lines we will see what pathology says. We will see patient back for reevaluation in 1 week here in the clinic. If anything worsens or changes patient will contact our office for additional recommendations. Electronic Signature(s) Signed: 10/08/2020 6:09:32 PM By: Worthy Keeler PA-C Entered By: Worthy Keeler on 10/08/2020 18:09:32 Kimberly [name] ([id number]) -------------------------------------------------------------------------------- ROS/PFSH Details Patient Name: Kimberly [name] Date of Service: 10/08/2020 12:45 PM Medical Record Number: [id number] Patient Account Number: [id number] Date of Birth/Sex: 01/02/1946 (75 y.o. F) Treating RN: Laurin Coder,  Janessa.Cage Primary Care Provider: Cathlean Cower Other Clinician: Referring Provider: Referral, Self Treating Provider/Extender: Skipper Cliche in Treatment: 0 Information Obtained From Patient Constitutional Symptoms (General Health) Complaints and Symptoms: Negative for: Fatigue; Fever; Chills; Marked Weight Change Eyes Complaints and Symptoms: Negative for: Dry Eyes; Vision Changes; Glasses / Contacts Ear/Nose/Mouth/Throat Complaints and Symptoms: Negative for: Difficult clearing ears; Sinusitis Hematologic/Lymphatic Complaints and Symptoms: Negative for: Bleeding / Clotting Disorders; Human Immunodeficiency Virus Medical History: Positive for: Anemia - hx Respiratory Complaints and Symptoms: Negative for: Chronic or frequent coughs; Shortness of Breath Medical History: Positive for: Asthma; Chronic Obstructive Pulmonary Disease (COPD) Cardiovascular Complaints and Symptoms: Negative for: Chest pain; LE edema Medical History: Positive for: Congestive Heart Failure;  Hypertension; Peripheral Arterial Disease Gastrointestinal Complaints and Symptoms: Negative for: Frequent diarrhea; Nausea; Vomiting Endocrine Complaints and Symptoms: Negative for: Hepatitis; Thyroid disease; Polydypsia (Excessive Thirst) Genitourinary Complaints and Symptoms: Negative for: Kidney failure/ Dialysis; Incontinence/dribbling Immunological TAKIYA, BELMARES B. ([id number]) Complaints and Symptoms: Negative for: Hives; Itching Integumentary (Skin) Complaints and Symptoms: Positive for: Wounds Negative for: Bleeding or bruising tendency; Breakdown; Swelling Musculoskeletal Complaints and Symptoms: Negative for: Muscle Pain; Muscle Weakness Neurologic Complaints and Symptoms: Negative for: Numbness/parasthesias; Focal/Weakness Psychiatric Complaints and Symptoms: Negative for: Anxiety; Claustrophobia Oncologic Immunizations Pneumococcal Vaccine: Received Pneumococcal Vaccination: Yes Received Pneumococcal Vaccination On or After 60th Birthday: Yes Implantable Devices Yes Family and Social History Cancer: Yes - Father; Diabetes: No; Heart Disease: Yes - Siblings; Hereditary Spherocytosis: No; Hypertension: Yes - Mother; Kidney Disease: No; Lung Disease: No; Seizures: No; Stroke: No; Thyroid Problems: No; Tuberculosis: Yes - Father; Current every day smoker; Marital Status - Married; Alcohol Use: Never; Drug Use: No History; Caffeine Use: Daily; Financial Concerns: No; Food, Clothing or Shelter Needs: No; Support System Lacking: No; Transportation Concerns: No Electronic Signature(s) Signed: 10/08/2020 4:52:51 PM By: Dolan Amen RN Signed: 10/08/2020 6:26:07 PM By: Worthy Keeler PA-C Entered By: Dolan Amen on 10/08/2020 13:10:23 Kimberly [name] ([id number]) -------------------------------------------------------------------------------- SuperBill Details Patient Name: Kimberly [name] Date of Service: 10/08/2020 Medical Record Number: [id number] Patient  Account Number: [id number] Date of Birth/Sex: [birth year] (74 y.o. F) Treating RN: Dolan Amen Primary Care Provider: Cathlean Cower Other Clinician: Referring Provider: Referral, Self Treating Provider/Extender: Skipper Cliche in Treatment: 0 Diagnosis Coding ICD-10 Codes Code Description 3201519632 Laceration without foreign body, right lower leg, initial encounter I89.0 Lymphedema, not elsewhere classified I73.9 Peripheral vascular disease, unspecified F17.218 Nicotine dependence, cigarettes, with other nicotine-induced disorders Facility Procedures CPT4 Code: 68341962 Description: 22979 - WOUND CARE VISIT-LEV 3 EST PT Modifier: Quantity: 1 CPT4 Code: 89211941 Description: 74081 - DEB SUBQ TISSUE 20 SQ CM/< Modifier: Quantity: 1 CPT4 Code: Description: ICD-10 Diagnosis Description K48.185U Laceration without foreign body, right lower leg, initial encounter Modifier: Quantity: Physician Procedures CPT4 Code: 3149702 Description: 63785 - WC PHYS LEVEL 4 - NEW PT Modifier: 25 Quantity: 1 CPT4 Code: Description: ICD-10 Diagnosis Description Y85.027X Laceration without foreign body, right lower leg, initial encounter I89.0 Lymphedema, not elsewhere classified I73.9 Peripheral vascular disease, unspecified F17.218 Nicotine dependence, cigarettes, with  other nicotine-induced disorders Modifier: Quantity: CPT4 Code: 4128786 Description: 76720 - WC PHYS SUBQ TISS 20 SQ CM Modifier: Quantity: 1 CPT4 Code: Description: ICD-10 Diagnosis Description N47.096G Laceration without foreign body, right lower leg, initial encounter Modifier: Quantity: Electronic Signature(s) Signed: 10/08/2020 6:10:56 PM By: Worthy Keeler PA-C Previous Signature: 10/08/2020 4:47:25 PM Version By: Dolan Amen RN Entered By: Worthy Keeler on 10/08/2020 18:10:55

## 2020-10-09 NOTE — Progress Notes (Signed)
Kimberly [name], Kimberly [name] ([id number]) Visit Report for 10/08/2020 Abuse/Suicide Risk Screen Details Patient Name: Kimberly [name], Kimberly [name] Date of Service: 10/08/2020 12:45 PM Medical Record Number: [id number] Patient Account Number: [id number] Date of Birth/Sex: Nov 26, 1945 (75 y.o. F) Treating RN: Dolan Amen Primary Care Marybeth Dandy: Cathlean Cower Other Clinician: Referring Ebrima Ranta: Referral, Self Treating Cynthis Purington/Extender: Skipper Cliche in Treatment: 0 Abuse/Suicide Risk Screen Items Answer ABUSE RISK SCREEN: Has anyone close to you tried to hurt or harm you recentlyo No Do you feel uncomfortable with anyone in your familyo No Has anyone forced you do things that you didnot want to doo No Electronic Signature(s) Signed: 10/08/2020 4:52:51 PM By: Dolan Amen RN Entered By: Dolan Amen on 10/08/2020 13:10:29 Kimberly [name] ([id number]) -------------------------------------------------------------------------------- Activities of Daily Living Details Patient Name: Kimberly [name] Date of Service: 10/08/2020 12:45 PM Medical Record Number: [id number] Patient Account Number: [id number] Date of Birth/Sex: Jan 13, 1946 (75 y.o. F) Treating RN: Dolan Amen Primary Care Ife Vitelli: Cathlean Cower Other Clinician: Referring Ilena Dieckman: Referral, Self Treating Latron Ribas/Extender: Skipper Cliche in Treatment: 0 Activities of Daily Living Items Answer Activities of Daily Living (Please select one for each item) Drive Automobile Completely Able Take Medications Completely Able Use Telephone Completely Able Care for Appearance Completely Able Use Toilet Completely Able Bath / Shower Completely Able Dress Self Completely Able Feed Self Completely Able Walk Completely Able Get In / Out Bed Completely Able Housework Completely Able Prepare Meals Completely Able Handle Money Completely Able Shop for Self Completely Able Electronic Signature(s) Signed: 10/08/2020 4:52:51 PM By: Dolan Amen  RN Entered By: Dolan Amen on 10/08/2020 13:10:49 Kimberly [name] ([id number]) -------------------------------------------------------------------------------- Education Screening Details Patient Name: Kimberly [name] Date of Service: 10/08/2020 12:45 PM Medical Record Number: [id number] Patient Account Number: [id number] Date of Birth/Sex: 1945-07-09 (75 y.o. F) Treating RN: Dolan Amen Primary Care Nomi Rudnicki: Cathlean Cower Other Clinician: Referring Braeson Rupe: Referral, Self Treating Jordan Caraveo/Extender: Skipper Cliche in Treatment: 0 Primary Learner Assessed: Patient Learning Preferences/Education Level/Primary Language Learning Preference: Explanation, Demonstration Highest Education Level: High School Preferred Language: English Cognitive Barrier Language Barrier: No Translator Needed: No Memory Deficit: No Emotional Barrier: No Cultural/Religious Beliefs Affecting Medical Care: No Physical Barrier Impaired Vision: No Impaired Hearing: No Decreased Hand dexterity: No Knowledge/Comprehension Knowledge Level: Medium Comprehension Level: Medium Ability to understand written instructions: Medium Ability to understand verbal instructions: Medium Motivation Anxiety Level: Calm Cooperation: Cooperative Education Importance: Acknowledges Need Interest in Health Problems: Asks Questions Perception: Coherent Willingness to Engage in Self-Management Medium Activities: Readiness to Engage in Self-Management Medium Activities: Electronic Signature(s) Signed: 10/08/2020 4:52:51 PM By: Dolan Amen RN Entered By: Dolan Amen on 10/08/2020 13:11:12 Kimberly [name] ([id number]) -------------------------------------------------------------------------------- Fall Risk Assessment Details Patient Name: Kimberly [name] Date of Service: 10/08/2020 12:45 PM Medical Record Number: [id number] Patient Account Number: [id number] Date of Birth/Sex: 1945/04/14 (75 y.o. F) Treating RN:  Dolan Amen Primary Care Yaasir Menken: Cathlean Cower Other Clinician: Referring Jarry Manon: Referral, Self Treating Pria Klosinski/Extender: Skipper Cliche in Treatment: 0 Fall Risk Assessment Items Have you had 2 or more falls in the last 12 monthso 0 No Have you had any fall that resulted in injury in the last 12 monthso 0 No FALLS RISK SCREEN History of falling - immediate or within 3 months 0 No Secondary diagnosis (Do you have 2 or more medical diagnoseso) 15 Yes Ambulatory aid None/bed rest/wheelchair/nurse 0 Yes Crutches/cane/walker 0 No Furniture 0 No Intravenous therapy Access/Saline/Heparin Lock 0 No Gait/Transferring Normal/ bed rest/ wheelchair  0 Yes Weak (short steps with or without shuffle, stooped but able to lift head while walking, may 0 No seek support from furniture) Impaired (short steps with shuffle, may have difficulty arising from chair, head down, impaired 0 No balance) Mental Status Oriented to own ability 0 Yes Electronic Signature(s) Signed: 10/08/2020 4:52:51 PM By: Dolan Amen RN Entered By: Dolan Amen on 10/08/2020 13:12:26 Kimberly [name] ([id number]) -------------------------------------------------------------------------------- Foot Assessment Details Patient Name: Kimberly [name] Date of Service: 10/08/2020 12:45 PM Medical Record Number: [id number] Patient Account Number: [id number] Date of Birth/Sex: 20-Dec-1945 (75 y.o. F) Treating RN: Dolan Amen Primary Care Almando Brawley: Cathlean Cower Other Clinician: Referring Nyhla Mountjoy: Referral, Self Treating Shalah Estelle/Extender: Skipper Cliche in Treatment: 0 Foot Assessment Items Site Locations + = Sensation present, - = Sensation absent, C = Callus, U = Ulcer R = Redness, W = Warmth, M = Maceration, PU = Pre-ulcerative lesion F = Fissure, S = Swelling, D = Dryness Assessment Right: Left: Other Deformity: No No Prior Foot Ulcer: No No Prior Amputation: No No Charcot Joint: No No Ambulatory  Status: Ambulatory Without Help Gait: Steady Electronic Signature(s) Signed: 10/08/2020 4:52:51 PM By: Dolan Amen RN Entered By: Dolan Amen on 10/08/2020 13:12:41 Kimberly [name] ([id number]) -------------------------------------------------------------------------------- Nutrition Risk Screening Details Patient Name: Kimberly [name] Date of Service: 10/08/2020 12:45 PM Medical Record Number: [id number] Patient Account Number: [id number] Date of Birth/Sex: 1945/07/02 (75 y.o. F) Treating RN: Dolan Amen Primary Care Cato Liburd: Cathlean Cower Other Clinician: Referring Traevion Poehler: Referral, Self Treating Arnita Koons/Extender: Skipper Cliche in Treatment: 0 Height (in): 61 Weight (lbs): 111.9 Body Mass Index (BMI): 21.1 Nutrition Risk Screening Items Score Screening NUTRITION RISK SCREEN: I have an illness or condition that made me change the kind and/or amount of food I eat 0 No I eat fewer than two meals per day 0 No I eat few fruits and vegetables, or milk products 0 No I have three or more drinks of beer, liquor or wine almost every day 0 No I have tooth or mouth problems that make it hard for me to eat 0 No I don't always have enough money to buy the food I need 0 No I eat alone most of the time 0 No I take three or more different prescribed or over-the-counter drugs a day 1 Yes Without wanting to, I have lost or gained 10 pounds in the last six months 0 No I am not always physically able to shop, cook and/or feed myself 0 No Nutrition Protocols Good Risk Protocol 0 No interventions needed Moderate Risk Protocol High Risk Proctocol Risk Level: Good Risk Score: 1 Electronic Signature(s) Signed: 10/08/2020 4:52:51 PM By: Dolan Amen RN Entered By: Dolan Amen on 10/08/2020 13:12:34

## 2020-10-09 NOTE — Progress Notes (Signed)
LANEA, VANKIRK ([id number]) Visit Report for 10/08/2020 Allergy List Details Patient Name: Kimberly [name] Date of Service: 10/08/2020 12:45 PM Medical Record Number: [id number] Patient Account Number: [id number] Date of Birth/Sex: 03/30/1945 (75 y.o. F) Treating RN: Dolan Amen Primary Care Bianey Tesoro: Cathlean Cower Other Clinician: Referring Carthel Castille: Referral, Self Treating Garlon Tuggle/Extender: Jeri Cos Weeks in Treatment: 0 Allergies Active Allergies No Known Drug Allergies Allergy Notes Electronic Signature(s) Signed: 10/08/2020 4:52:51 PM By: Dolan Amen RN Entered By: Dolan Amen on 10/08/2020 13:08:36 Kimberly [name] ([id number]) -------------------------------------------------------------------------------- Arrival Information Details Patient Name: Kimberly [name] Date of Service: 10/08/2020 12:45 PM Medical Record Number: [id number] Patient Account Number: [id number] Date of Birth/Sex: 06/23/1945 (75 y.o. F) Treating RN: Dolan Amen Primary Care Damieon Armendariz: Cathlean Cower Other Clinician: Referring Everlyn Farabaugh: Referral, Self Treating Sparkle Aube/Extender: Skipper Cliche in Treatment: 0 Visit Information Patient Arrived: Ambulatory Arrival Time: 13:03 Accompanied By: husband Transfer Assistance: None Patient Identification Verified: Yes Secondary Verification Process Completed: Yes History Since Last Visit Electronic Signature(s) Signed: 10/08/2020 4:52:51 PM By: Dolan Amen RN Entered By: Dolan Amen on 10/08/2020 13:13:19 Kimberly [name] ([id number]) -------------------------------------------------------------------------------- Clinic Level of Care Assessment Details Patient Name: Kimberly [name] Date of Service: 10/08/2020 12:45 PM Medical Record Number: [id number] Patient Account Number: [id number] Date of Birth/Sex: August 29, 1945 (75 y.o. F) Treating RN: Dolan Amen Primary Care Boone Gear: Cathlean Cower Other Clinician: Referring Melanni Benway: Referral,  Self Treating Adisa Vigeant/Extender: Skipper Cliche in Treatment: 0 Clinic Level of Care Assessment Items TOOL 1 Quantity Score X - Use when EandM and Procedure is performed on INITIAL visit 1 0 ASSESSMENTS - Nursing Assessment / Reassessment X - General Physical Exam (combine w/ comprehensive assessment (listed just below) when performed on new 1 20 pt. evals) X- 1 25 Comprehensive Assessment (HX, ROS, Risk Assessments, Wounds Hx, etc.) ASSESSMENTS - Wound and Skin Assessment / Reassessment '[]'  - Dermatologic / Skin Assessment (not related to wound area) 0 ASSESSMENTS - Ostomy and/or Continence Assessment and Care '[]'  - Incontinence Assessment and Management 0 '[]'  - 0 Ostomy Care Assessment and Management (repouching, etc.) PROCESS - Coordination of Care X - Simple Patient / Family Education for ongoing care 1 15 '[]'  - 0 Complex (extensive) Patient / Family Education for ongoing care X- 1 10 Staff obtains Programmer, systems, Records, Test Results / Process Orders '[]'  - 0 Staff telephones HHA, Nursing Homes / Clarify orders / etc '[]'  - 0 Routine Transfer to another Facility (non-emergent condition) '[]'  - 0 Routine Hospital Admission (non-emergent condition) X- 1 15 New Admissions / Biomedical engineer / Ordering NPWT, Apligraf, etc. '[]'  - 0 Emergency Hospital Admission (emergent condition) PROCESS - Special Needs '[]'  - Pediatric / Minor Patient Management 0 '[]'  - 0 Isolation Patient Management '[]'  - 0 Hearing / Language / Visual special needs '[]'  - 0 Assessment of Community assistance (transportation, D/C planning, etc.) '[]'  - 0 Additional assistance / Altered mentation '[]'  - 0 Support Surface(s) Assessment (bed, cushion, seat, etc.) INTERVENTIONS - Miscellaneous '[]'  - External ear exam 0 '[]'  - 0 Patient Transfer (multiple staff / Civil Service fast streamer / Similar devices) '[]'  - 0 Simple Staple / Suture removal (25 or less) '[]'  - 0 Complex Staple / Suture removal (26 or more) '[]'  -  0 Hypo/Hyperglycemic Management (do not check if billed separately) '[]'  - 0 Ankle / Brachial Index (ABI) - do not check if billed separately Has the patient been seen at the hospital within the last three years: Yes Total Score: 85 Level Of Care:  New/Established - Level 3 BOBBETTE, EAKES ([id number]) Electronic Signature(s) Signed: 10/08/2020 4:52:51 PM By: Dolan Amen RN Entered By: Dolan Amen on 10/08/2020 16:47:17 Kimberly [name] ([id number]) -------------------------------------------------------------------------------- Encounter Discharge Information Details Patient Name: Kimberly [name] Date of Service: 10/08/2020 12:45 PM Medical Record Number: [id number] Patient Account Number: [id number] Date of Birth/Sex: [birth year] (75 y.o. F) Treating RN: Dolan Amen Primary Care Nereyda Bowler: Cathlean Cower Other Clinician: Referring Kirkland Figg: Referral, Self Treating Chonita Gadea/Extender: Skipper Cliche in Treatment: 0 Encounter Discharge Information Items Post Procedure Vitals Discharge Condition: Stable Temperature (F): 98 Ambulatory Status: Ambulatory Pulse (bpm): 59 Discharge Destination: Home Respiratory Rate (breaths/min): 16 Transportation: Private Auto Blood Pressure (mmHg): 103/66 Accompanied By: husband Schedule Follow-up Appointment: Yes Clinical Summary of Care: Electronic Signature(s) Signed: 10/08/2020 4:48:48 PM By: Dolan Amen RN Entered By: Dolan Amen on 10/08/2020 16:48:48 Kimberly [name] ([id number]) -------------------------------------------------------------------------------- Lower Extremity Assessment Details Patient Name: Kimberly [name] Date of Service: 10/08/2020 12:45 PM Medical Record Number: [id number] Patient Account Number: [id number] Date of Birth/Sex: [birth year] (75 y.o. F) Treating RN: Dolan Amen Primary Care Anniece Bleiler: Cathlean Cower Other Clinician: Referring Reece Fehnel: Referral, Self Treating Juanell Saffo/Extender: Jeri Cos Weeks in  Treatment: 0 Edema Assessment Assessed: [Left: Yes] [Right: Yes] Edema: [Left: No] [Right: No] Electronic Signature(s) Signed: 10/08/2020 4:52:51 PM By: Dolan Amen RN Entered By: Dolan Amen on 10/08/2020 13:07:46 Kimberly [name] ([id number]) -------------------------------------------------------------------------------- Multi Wound Chart Details Patient Name: Kimberly [name] Date of Service: 10/08/2020 12:45 PM Medical Record Number: [id number] Patient Account Number: [id number] Date of Birth/Sex: 04-10-1945 (75 y.o. F) Treating RN: Dolan Amen Primary Care Nikitia Asbill: Cathlean Cower Other Clinician: Referring Barton Want: Referral, Self Treating Aqua Denslow/Extender: Skipper Cliche in Treatment: 0 Vital Signs Height(in): 61 Pulse(bpm): 65 Weight(lbs): 106 Blood Pressure(mmHg): 103/66 Body Mass Index(BMI): 20 Temperature(F): 98 Respiratory Rate(breaths/min): 16 Photos: [N/A:N/A] Wound Location: Right Upper Leg N/A N/A Wounding Event: Bump N/A N/A Primary Etiology: Infection - not elsewhere classified N/A N/A Date Acquired: 09/07/2020 N/A N/A Weeks of Treatment: 0 N/A N/A Wound Status: Open N/A N/A Measurements L x W x D (cm) 1x1x0.1 N/A N/A Area (cm) : 0.785 N/A N/A Volume (cm) : 0.079 N/A N/A Classification: Full Thickness Without Exposed N/A N/A Support Structures Exudate Amount: None Present N/A N/A Granulation Amount: None Present (0%) N/A N/A Necrotic Amount: None Present (0%) N/A N/A Exposed Structures: Fascia: No N/A N/A Fat Layer (Subcutaneous Tissue): No Tendon: No Muscle: No Joint: No Bone: No Epithelialization: None N/A N/A Treatment Notes Electronic Signature(s) Signed: 10/08/2020 4:52:51 PM By: Dolan Amen RN Entered By: Dolan Amen on 10/08/2020 14:04:59 Kimberly [name] ([id number]) -------------------------------------------------------------------------------- Multi-Disciplinary Care Plan Details Patient Name: Kimberly [name] Date of  Service: 10/08/2020 12:45 PM Medical Record Number: [id number] Patient Account Number: [id number] Date of Birth/Sex: 1945/03/11 (75 y.o. F) Treating RN: Dolan Amen Primary Care Zoey Bidwell: Cathlean Cower Other Clinician: Referring Tameem Pullara: Referral, Self Treating Kierstin January/Extender: Skipper Cliche in Treatment: 0 Active Inactive Orientation to the Wound Care Program Nursing Diagnoses: Knowledge deficit related to the wound healing center program Goals: Patient/caregiver will verbalize understanding of the [place] Program Date Initiated: 10/08/2020 Target Resolution Date: 10/08/2020 Goal Status: Active Interventions: Provide education on orientation to the wound center Notes: Wound/Skin Impairment Nursing Diagnoses: Impaired tissue integrity Goals: Patient/caregiver will verbalize understanding of skin care regimen Date Initiated: 10/08/2020 Target Resolution Date: 10/08/2020 Goal Status: Active Ulcer/skin breakdown will have a volume reduction of 30% by week 4 Date Initiated: 10/08/2020  Target Resolution Date: 11/07/2020 Goal Status: Active Ulcer/skin breakdown will have a volume reduction of 50% by week 8 Date Initiated: 10/08/2020 Target Resolution Date: 12/08/2020 Goal Status: Active Ulcer/skin breakdown will have a volume reduction of 80% by week 12 Date Initiated: 10/08/2020 Target Resolution Date: 01/07/2021 Goal Status: Active Ulcer/skin breakdown will heal within 14 weeks Date Initiated: 10/08/2020 Target Resolution Date: 02/07/2021 Goal Status: Active Interventions: Assess patient/caregiver ability to obtain necessary supplies Assess patient/caregiver ability to perform ulcer/skin care regimen upon admission and as needed Assess ulceration(s) every visit Provide education on ulcer and skin care Treatment Activities: Referred to DME Sharonlee Nine for dressing supplies : 10/08/2020 Notes: Electronic Signature(s) Signed: 10/08/2020 4:52:51 PM By: Dolan Amen  RN Entered By: Dolan Amen on 10/08/2020 Bally, Yareni B. ([id number]) -------------------------------------------------------------------------------- Pain Assessment Details Patient Name: Kimberly [name] Date of Service: 10/08/2020 12:45 PM Medical Record Number: [id number] Patient Account Number: [id number] Date of Birth/Sex: 1945-07-01 (75 y.o. F) Treating RN: Dolan Amen Primary Care Dalan Cowger: Cathlean Cower Other Clinician: Referring Bryten Maher: Referral, Self Treating Briya Lookabaugh/Extender: Skipper Cliche in Treatment: 0 Active Problems Location of Pain Severity and Description of Pain Patient Has Paino No Site Locations Rate the pain. Current Pain Level: 0 Pain Management and Medication Current Pain Management: Electronic Signature(s) Signed: 10/08/2020 4:52:51 PM By: Dolan Amen RN Entered By: Dolan Amen on 10/08/2020 13:13:12 Kimberly [name] ([id number]) -------------------------------------------------------------------------------- Patient/Caregiver Education Details Patient Name: Kimberly [name] Date of Service: 10/08/2020 12:45 PM Medical Record Number: [id number] Patient Account Number: [id number] Date of Birth/Gender: 03-10-1945 (75 y.o. F) Treating RN: Dolan Amen Primary Care Physician: Cathlean Cower Other Clinician: Referring Physician: Referral, Self Treating Physician/Extender: Skipper Cliche in Treatment: 0 Education Assessment Education Provided To: Patient Education Topics Provided Welcome To [facility] [place]: Methods: Explain/Verbal Responses: State content correctly Wound/Skin Impairment: Methods: Explain/Verbal Responses: State content correctly Electronic Signature(s) Signed: 10/08/2020 4:52:51 PM By: Dolan Amen RN Entered By: Dolan Amen on 10/08/2020 16:47:36 Kimberly [name] ([id number]) -------------------------------------------------------------------------------- Wound Assessment Details Patient Name:  Kimberly [name] Date of Service: 10/08/2020 12:45 PM Medical Record Number: [id number] Patient Account Number: [id number] Date of Birth/Sex: 06-22-1945 (75 y.o. F) Treating RN: Dolan Amen Primary Care Jerrico Covello: Cathlean Cower Other Clinician: Referring Takoda Janowiak: Referral, Self Treating Poppy Mcafee/Extender: Skipper Cliche in Treatment: 0 Wound Status Wound Number: 3 Primary Lesion Etiology: Wound Location: Right Upper Leg Wound Open Wounding Event: Other Lesion Status: Date Acquired: 09/07/2020 Comorbid Anemia, Asthma, Chronic Obstructive Pulmonary Disease Weeks Of Treatment: 0 History: (COPD), Congestive Heart Failure, Hypertension, Clustered Wound: No Peripheral Arterial Disease Photos Wound Measurements Length: (cm) 1 Width: (cm) 1 Depth: (cm) 0.1 Area: (cm) 0.785 Volume: (cm) 0.079 % Reduction in Area: 0% % Reduction in Volume: 0% Epithelialization: None Tunneling: No Undermining: No Wound Description Classification: Full Thickness Without Exposed Support Structu Exudate Amount: Medium Exudate Type: Sanguinous Exudate Color: red res Foul Odor After Cleansing: No Slough/Fibrino No Wound Bed Granulation Amount: Large (67-100%) Exposed Structure Granulation Quality: Red Fascia Exposed: No Necrotic Amount: None Present (0%) Fat Layer (Subcutaneous Tissue) Exposed: Yes Tendon Exposed: No Muscle Exposed: No Joint Exposed: No Bone Exposed: No Treatment Notes Wound #3 (Upper Leg) Wound Laterality: Right Cleanser [place] Ancillary Kit - 15 Day Supply Discharge Instruction: Use supplies as instructed; Kit contains: (15) Saline Bullets; (15) 3x3 Gauze; 15 pr Gloves Soap and Water [place], [place] B. ([id number]) Discharge Instruction: Gently cleanse wound with antibacterial soap, rinse and pat dry prior to dressing  wounds Peri-Wound Care Topical Primary Dressing Silvercel Small 2x2 (in/in) Discharge Instruction: Apply Silvercel Small 2x2 (in/in) as instructed Zetuvit  Plus Silicone Border Dressing 4x4 (in/in) Discharge Instruction: Secure silver alginate Secondary Dressing Secured With Compression Wrap Compression Stockings Add-Ons Electronic Signature(s) Signed: 10/08/2020 4:52:51 PM By: Dolan Amen RN Entered By: Dolan Amen on 10/08/2020 14:21:16 Kimberly [name] ([id number]) -------------------------------------------------------------------------------- [place] Details Patient Name: Kimberly [name] Date of Service: 10/08/2020 12:45 PM Medical Record Number: [id number] Patient Account Number: [id number] Date of Birth/Sex: Dec 25, 1945 (75 y.o. F) Treating RN: Dolan Amen Primary Care Ivana Nicastro: Cathlean Cower Other Clinician: Referring Adair Lemar: Referral, Self Treating Shanda Cadotte/Extender: Skipper Cliche in Treatment: 0 Vital Signs Time Taken: 13:14 Temperature (F): 98 Height (in): 61 Pulse (bpm): 59 Source: Stated Respiratory Rate (breaths/min): 16 Weight (lbs): 106 Blood Pressure (mmHg): 103/66 Source: Measured Reference Range: 80 - 120 mg / dl Body Mass Index (BMI): 20 Electronic Signature(s) Signed: 10/08/2020 4:52:51 PM By: Dolan Amen RN Entered By: Dolan Amen on 10/08/2020 13:16:58

## 2020-10-10 DIAGNOSIS — S81811A Laceration without foreign body, right lower leg, initial encounter: Secondary | ICD-10-CM | POA: Diagnosis not present

## 2020-10-10 LAB — SURGICAL PATHOLOGY

## 2020-10-14 ENCOUNTER — Encounter: Payer: Self-pay | Admitting: Internal Medicine

## 2020-10-15 MED ORDER — FUROSEMIDE 20 MG PO TABS: 20.0000 mg | ORAL_TABLET | Freq: Every day | ORAL | 0 refills | Status: DC | PRN

## 2020-10-18 ENCOUNTER — Encounter: Payer: Medicare Other | Admitting: Physician Assistant

## 2020-10-18 ENCOUNTER — Other Ambulatory Visit: Payer: Self-pay

## 2020-10-18 DIAGNOSIS — I739 Peripheral vascular disease, unspecified: Secondary | ICD-10-CM | POA: Diagnosis not present

## 2020-10-18 DIAGNOSIS — C44722 Squamous cell carcinoma of skin of right lower limb, including hip: Secondary | ICD-10-CM | POA: Diagnosis not present

## 2020-10-18 DIAGNOSIS — I890 Lymphedema, not elsewhere classified: Secondary | ICD-10-CM | POA: Diagnosis not present

## 2020-10-18 DIAGNOSIS — L97112 Non-pressure chronic ulcer of right thigh with fat layer exposed: Secondary | ICD-10-CM | POA: Diagnosis not present

## 2020-10-18 DIAGNOSIS — S81811A Laceration without foreign body, right lower leg, initial encounter: Secondary | ICD-10-CM | POA: Diagnosis not present

## 2020-10-18 DIAGNOSIS — F17218 Nicotine dependence, cigarettes, with other nicotine-induced disorders: Secondary | ICD-10-CM | POA: Diagnosis not present

## 2020-10-18 NOTE — Progress Notes (Signed)
DYMPHNA, WADLEY ([id number]) Visit Report for 10/18/2020 Arrival Information Details Patient Name: Kimberly [name], Kimberly [name] Date of Service: 10/18/2020 9:00 AM Medical Record Number: [id number] Patient Account Number: [id number] Date of Birth/Sex: 16-Nov-1945 (75 y.o. F) Treating RN: Dolan Amen Primary Care Cicero Noy: Cathlean Cower Other Clinician: Referring Analiya Porco: Cathlean Cower Treating Aima Mcwhirt/Extender: Skipper Cliche in Treatment: 1 Visit Information History Since Last Visit Pain Present Now: No Patient Arrived: Ambulatory Arrival Time: 09:13 Accompanied By: husband Transfer Assistance: None Patient Identification Verified: Yes Secondary Verification Process Completed: Yes Electronic Signature(s) Signed: 10/18/2020 4:16:02 PM By: Dolan Amen RN Entered By: Dolan Amen on 10/18/2020 09:16:36 Kimberly [name] ([id number]) -------------------------------------------------------------------------------- Clinic Level of Care Assessment Details Patient Name: Kimberly [name] Date of Service: 10/18/2020 9:00 AM Medical Record Number: [id number] Patient Account Number: [id number] Date of Birth/Sex: 12-20-1945 (75 y.o. F) Treating RN: Dolan Amen Primary Care Korban Shearer: Cathlean Cower Other Clinician: Referring Kayn Haymore: Cathlean Cower Treating Annalissa Murphey/Extender: Skipper Cliche in Treatment: 1 Clinic Level of Care Assessment Items TOOL 4 Quantity Score X - Use when only an EandM is performed on FOLLOW-UP visit 1 0 ASSESSMENTS - Nursing Assessment / Reassessment X - Reassessment of Co-morbidities (includes updates in patient status) 1 10 X- 1 5 Reassessment of Adherence to Treatment Plan ASSESSMENTS - Wound and Skin Assessment / Reassessment X - Simple Wound Assessment / Reassessment - one wound 1 5 '[]'  - 0 Complex Wound Assessment / Reassessment - multiple wounds '[]'  - 0 Dermatologic / Skin Assessment (not related to wound area) ASSESSMENTS - Focused Assessment '[]'  - Circumferential  Edema Measurements - multi extremities 0 '[]'  - 0 Nutritional Assessment / Counseling / Intervention '[]'  - 0 Lower Extremity Assessment (monofilament, tuning fork, pulses) '[]'  - 0 Peripheral Arterial Disease Assessment (using hand held doppler) ASSESSMENTS - Ostomy and/or Continence Assessment and Care '[]'  - Incontinence Assessment and Management 0 '[]'  - 0 Ostomy Care Assessment and Management (repouching, etc.) PROCESS - Coordination of Care X - Simple Patient / Family Education for ongoing care 1 15 '[]'  - 0 Complex (extensive) Patient / Family Education for ongoing care '[]'  - 0 Staff obtains Programmer, systems, Records, Test Results / Process Orders '[]'  - 0 Staff telephones HHA, Nursing Homes / Clarify orders / etc '[]'  - 0 Routine Transfer to another Facility (non-emergent condition) '[]'  - 0 Routine Hospital Admission (non-emergent condition) '[]'  - 0 New Admissions / Biomedical engineer / Ordering NPWT, Apligraf, etc. '[]'  - 0 Emergency Hospital Admission (emergent condition) X- 1 10 Simple Discharge Coordination '[]'  - 0 Complex (extensive) Discharge Coordination PROCESS - Special Needs '[]'  - Pediatric / Minor Patient Management 0 '[]'  - 0 Isolation Patient Management '[]'  - 0 Hearing / Language / Visual special needs '[]'  - 0 Assessment of Community assistance (transportation, D/C planning, etc.) '[]'  - 0 Additional assistance / Altered mentation '[]'  - 0 Support Surface(s) Assessment (bed, cushion, seat, etc.) INTERVENTIONS - Wound Cleansing / Measurement EDELIN, FRYER B. ([id number]) X- 1 5 Simple Wound Cleansing - one wound '[]'  - 0 Complex Wound Cleansing - multiple wounds X- 1 5 Wound Imaging (photographs - any number of wounds) '[]'  - 0 Wound Tracing (instead of photographs) X- 1 5 Simple Wound Measurement - one wound '[]'  - 0 Complex Wound Measurement - multiple wounds INTERVENTIONS - Wound Dressings '[]'  - Small Wound Dressing one or multiple wounds 0 X- 1 15 Medium Wound Dressing one  or multiple wounds '[]'  - 0 Large Wound Dressing one or multiple wounds '[]'  - 0 Application  of Medications - topical '[]'  - 0 Application of Medications - injection INTERVENTIONS - Miscellaneous '[]'  - External ear exam 0 '[]'  - 0 Specimen Collection (cultures, biopsies, blood, body fluids, etc.) '[]'  - 0 Specimen(s) / Culture(s) sent or taken to Lab for analysis '[]'  - 0 Patient Transfer (multiple staff / Civil Service fast streamer / Similar devices) '[]'  - 0 Simple Staple / Suture removal (25 or less) '[]'  - 0 Complex Staple / Suture removal (26 or more) '[]'  - 0 Hypo / Hyperglycemic Management (close monitor of Blood Glucose) '[]'  - 0 Ankle / Brachial Index (ABI) - do not check if billed separately X- 1 5 Vital Signs Has the patient been seen at the hospital within the last three years: Yes Total Score: 80 Level Of Care: New/Established - Level 3 Electronic Signature(s) Signed: 10/18/2020 4:16:02 PM By: Dolan Amen RN Entered By: Dolan Amen on 10/18/2020 10:06:11 Kimberly [name] ([id number]) -------------------------------------------------------------------------------- Lower Extremity Assessment Details Patient Name: Kimberly [name] Date of Service: 10/18/2020 9:00 AM Medical Record Number: [id number] Patient Account Number: [id number] Date of Birth/Sex: Feb 05, 1945 (75 y.o. F) Treating RN: Dolan Amen Primary Care Jasalyn Frysinger: Cathlean Cower Other Clinician: Referring Clydean Posas: Cathlean Cower Treating Robbie Nangle/Extender: Jeri Cos Weeks in Treatment: 1 Electronic Signature(s) Signed: 10/18/2020 4:16:02 PM By: Dolan Amen RN Entered By: Dolan Amen on 10/18/2020 09:21:18 Kimberly [name] ([id number]) -------------------------------------------------------------------------------- Multi Wound Chart Details Patient Name: Kimberly [name] Date of Service: 10/18/2020 9:00 AM Medical Record Number: [id number] Patient Account Number: [id number] Date of Birth/Sex: [birth year] (75 y.o. F) Treating RN:  Dolan Amen Primary Care Nathalya Wolanski: Cathlean Cower Other Clinician: Referring Lacrisha Bielicki: Cathlean Cower Treating Velera Lansdale/Extender: Skipper Cliche in Treatment: 1 Vital Signs Height(in): 61 Pulse(bpm): 60 Weight(lbs): 106 Blood Pressure(mmHg): 105/52 Body Mass Index(BMI): 20 Temperature(F): 98.2 Respiratory Rate(breaths/min): 18 Photos: [N/A:N/A] Wound Location: Right Upper Leg N/A N/A Wounding Event: Other Lesion N/A N/A Primary Etiology: Lesion N/A N/A Comorbid History: Anemia, Asthma, Chronic N/A N/A Obstructive Pulmonary Disease (COPD), Congestive Heart Failure, Hypertension, Peripheral Arterial Disease Date Acquired: 09/07/2020 N/A N/A Weeks of Treatment: 1 N/A N/A Wound Status: Open N/A N/A Measurements L x W x D (cm) 0.1x0.1x0.1 N/A N/A Area (cm) : 0.008 N/A N/A Volume (cm) : 0.001 N/A N/A % Reduction in Area: 99.00% N/A N/A % Reduction in Volume: 98.70% N/A N/A Classification: Full Thickness Without Exposed N/A N/A Support Structures Exudate Amount: Small N/A N/A Exudate Type: Sanguinous N/A N/A Exudate Color: red N/A N/A Granulation Amount: Large (67-100%) N/A N/A Granulation Quality: Red N/A N/A Necrotic Amount: None Present (0%) N/A N/A Exposed Structures: Fat Layer (Subcutaneous Tissue): N/A N/A Yes Fascia: No Tendon: No Muscle: No Joint: No Bone: No Epithelialization: Large (67-100%) N/A N/A Treatment Notes Electronic Signature(s) Signed: 10/18/2020 4:16:02 PM By: Dolan Amen RN Entered By: Dolan Amen on 10/18/2020 10:00:43 Kimberly [name] ([id number]) -------------------------------------------------------------------------------- Multi-Disciplinary Care Plan Details Patient Name: Kimberly [name] Date of Service: 10/18/2020 9:00 AM Medical Record Number: [id number] Patient Account Number: [id number] Date of Birth/Sex: 1945/11/10 (75 y.o. F) Treating RN: Dolan Amen Primary Care Jazmeen Axtell: Cathlean Cower Other Clinician: Referring Kayleeann Huxford:  Cathlean Cower Treating Disaya Walt/Extender: Skipper Cliche in Treatment: 1 Active Inactive Wound/Skin Impairment Nursing Diagnoses: Impaired tissue integrity Goals: Patient/caregiver will verbalize understanding of skin care regimen Date Initiated: 10/08/2020 Date Inactivated: 10/18/2020 Target Resolution Date: 10/08/2020 Goal Status: Met Ulcer/skin breakdown will have a volume reduction of 30% by week 4 Date Initiated: 10/08/2020 Date Inactivated: 10/18/2020 Target Resolution Date: 11/07/2020  Goal Status: Met Ulcer/skin breakdown will have a volume reduction of 50% by week 8 Date Initiated: 10/08/2020 Date Inactivated: 10/18/2020 Target Resolution Date: 12/08/2020 Goal Status: Met Ulcer/skin breakdown will have a volume reduction of 80% by week 12 Date Initiated: 10/08/2020 Date Inactivated: 10/18/2020 Target Resolution Date: 01/07/2021 Goal Status: Met Ulcer/skin breakdown will heal within 14 weeks Date Initiated: 10/08/2020 Target Resolution Date: 02/07/2021 Goal Status: Active Interventions: Assess patient/caregiver ability to obtain necessary supplies Assess patient/caregiver ability to perform ulcer/skin care regimen upon admission and as needed Assess ulceration(s) every visit Provide education on ulcer and skin care Treatment Activities: Referred to DME Ersel Wadleigh for dressing supplies : 10/08/2020 Notes: Electronic Signature(s) Signed: 10/18/2020 4:16:02 PM By: Dolan Amen RN Entered By: Dolan Amen on 10/18/2020 10:00:27 Kimberly [name] ([id number]) -------------------------------------------------------------------------------- Pain Assessment Details Patient Name: Kimberly [name] Date of Service: 10/18/2020 9:00 AM Medical Record Number: [id number] Patient Account Number: [id number] Date of Birth/Sex: 02/04/1945 (75 y.o. F) Treating RN: Dolan Amen Primary Care Arnella Pralle: Cathlean Cower Other Clinician: Referring Myrla Malanowski: Cathlean Cower Treating Sherline Eberwein/Extender:  Skipper Cliche in Treatment: 1 Active Problems Location of Pain Severity and Description of Pain Patient Has Paino No Site Locations Rate the pain. Current Pain Level: 0 Pain Management and Medication Current Pain Management: Electronic Signature(s) Signed: 10/18/2020 4:16:02 PM By: Dolan Amen RN Entered By: Dolan Amen on 10/18/2020 09:17:10 Kimberly [name] ([id number]) -------------------------------------------------------------------------------- Patient/Caregiver Education Details Patient Name: Kimberly [name] Date of Service: 10/18/2020 9:00 AM Medical Record Number: [id number] Patient Account Number: [id number] Date of Birth/Gender: April 11, 1945 (75 y.o. F) Treating RN: Dolan Amen Primary Care Physician: Cathlean Cower Other Clinician: Referring Physician: Cathlean Cower Treating Physician/Extender: Skipper Cliche in Treatment: 1 Education Assessment Education Provided To: Patient Education Topics Provided Wound/Skin Impairment: Methods: Explain/Verbal Responses: State content correctly Electronic Signature(s) Signed: 10/18/2020 4:16:02 PM By: Dolan Amen RN Entered By: Dolan Amen on 10/18/2020 10:06:23 Kimberly [name] ([id number]) -------------------------------------------------------------------------------- Wound Assessment Details Patient Name: Kimberly [name] Date of Service: 10/18/2020 9:00 AM Medical Record Number: [id number] Patient Account Number: [id number] Date of Birth/Sex: Apr 20, 1945 (75 y.o. F) Treating RN: Dolan Amen Primary Care Weslee Prestage: Cathlean Cower Other Clinician: Referring Darrall Strey: Cathlean Cower Treating Reiley Keisler/Extender: Jeri Cos Weeks in Treatment: 1 Wound Status Wound Number: 3 Primary Lesion Etiology: Wound Location: Right Upper Leg Wound Open Wounding Event: Other Lesion Status: Date Acquired: 09/07/2020 Comorbid Anemia, Asthma, Chronic Obstructive Pulmonary Disease Weeks Of Treatment: 1 History: (COPD),  Congestive Heart Failure, Hypertension, Clustered Wound: No Peripheral Arterial Disease Photos Wound Measurements Length: (cm) 0.1 Width: (cm) 0.1 Depth: (cm) 0.1 Area: (cm) 0.008 Volume: (cm) 0.001 % Reduction in Area: 99% % Reduction in Volume: 98.7% Epithelialization: Large (67-100%) Tunneling: No Undermining: No Wound Description Classification: Full Thickness Without Exposed Support Structu Exudate Amount: Small Exudate Type: Sanguinous Exudate Color: red res Foul Odor After Cleansing: No Slough/Fibrino No Wound Bed Granulation Amount: Large (67-100%) Exposed Structure Granulation Quality: Red Fascia Exposed: No Necrotic Amount: None Present (0%) Fat Layer (Subcutaneous Tissue) Exposed: Yes Tendon Exposed: No Muscle Exposed: No Joint Exposed: No Bone Exposed: No Electronic Signature(s) Signed: 10/18/2020 4:16:02 PM By: Dolan Amen RN Entered By: Dolan Amen on 10/18/2020 09:20:59 Kimberly [name] ([id number]) -------------------------------------------------------------------------------- Vitals Details Patient Name: Kimberly [name] Date of Service: 10/18/2020 9:00 AM Medical Record Number: [id number] Patient Account Number: [id number] Date of Birth/Sex: 08-20-1945 (75 y.o. F) Treating RN: Dolan Amen Primary Care Achaia Garlock: Cathlean Cower Other Clinician: Referring Seng Fouts:  Cathlean Cower Treating Alayla Dethlefs/Extender: Jeri Cos Weeks in Treatment: 1 Vital Signs Time Taken: 09:16 Temperature (F): 98.2 Height (in): 61 Pulse (bpm): 60 Weight (lbs): 106 Respiratory Rate (breaths/min): 18 Body Mass Index (BMI): 20 Blood Pressure (mmHg): 105/52 Reference Range: 80 - 120 mg / dl Electronic Signature(s) Signed: 10/18/2020 4:16:02 PM By: Dolan Amen RN Entered By: Dolan Amen on 10/18/2020 09:16:59

## 2020-10-18 NOTE — Progress Notes (Addendum)
DURINDA, BUZZELLI ([id number]) Visit Report for 10/18/2020 Chief Complaint Document Details Patient Name: Kimberly [name], Kimberly [name] Date of Service: 10/18/2020 9:00 AM Medical Record Number: [id number] Patient Account Number: [id number] Date of Birth/Sex: [birth year] (75 y.o. F) Treating RN: Dolan Amen Primary Care Provider: Cathlean Cower Other Clinician: Referring Provider: Cathlean Cower Treating Provider/Extender: Skipper Cliche in Treatment: 1 Information Obtained from: Patient Chief Complaint Right Upper Leg Ulcer Electronic Signature(s) Signed: 10/18/2020 9:26:18 AM By: Worthy Keeler PA-C Entered By: Worthy Keeler on 10/18/2020 09:26:18 Kimberly [name] ([id number]) -------------------------------------------------------------------------------- HPI Details Patient Name: Kimberly [name] Date of Service: 10/18/2020 9:00 AM Medical Record Number: [id number] Patient Account Number: [id number] Date of Birth/Sex: 1945-10-23 (75 y.o. F) Treating RN: Dolan Amen Primary Care Provider: Cathlean Cower Other Clinician: Referring Provider: Cathlean Cower Treating Provider/Extender: Skipper Cliche in Treatment: 1 History of Present Illness HPI Description: 75 year old patient was seen by the nurse practitioner at the [facility] primary care for a wound which has been there on her right leg since August 2018. For a period of time she has been treated with doxycycline, clindamycin and Bactrim and the wound continues to have drainage and odor.on 11/20/2016 she was again put on Bactrim DS and referred to the wound center. Past medical history significant for hypertension, congestive heart failure, COPD, dyslipidemia, migraines, pericarditis, status post VSD repair, cholecystectomy tubal ligation, cesarean section, coronary angiogram,my tenotomy with tube placement on the right,dilated cardiomyopathy,tobacco abuse and has been a smoker for the last 35 years. She also has a history of right great saphenous vein  ablation but the patient does not confirm this for sure. She has had a venous duplex study done in August 2018 which showed no evidence of lower extremity deep or superficial venous thrombus or incompetence bilaterally. She has a arterial duplex study pending, which is going to be done later today. 12/22/16 on evaluation today patient appears to be doing fairly well in regard to her right lateral lower extremity wounds. She did undergo a right common femoral artery balloon angioplasty and stent placement which was performed on 12/17/16. Patient this is did appear to be a sufficient improvement according to the reviewed notes which is excellent news. Prior to this procedure patient did have a study of the lower extremity this was a Doppler performed on 12/08/16 this revealed moderate right lower extremity arterial disease involving the common femoral artery and iliac segment. There was also left arterial obstruction involving the iliac segment. On the left there was arterial obstruction involving the iliac segment, common femoral artery, and peroneal artery. Patient states she is still having some soreness left over and residual from the procedure. However overall she appears to be doing very well. She does have a follow-up study with vascular in the next week. 01/02/2017 -- was seen by Dr. Jacqulyn Cane -- she had an abdominal aortogram with lower extremity peripheral vascular intervention, and this showed moderate left common iliac artery stenosis. Short occlusion of the right common femoral artery with no significant infrainguinal disease. Short occlusion of the left common femoral artery with no significant infrainguinal disease and a successful drug-coated balloon angioplasty stent was placed in the right common femoral artery. She had a lower extremity arterial duplex examination done which showed the resting right ABI is within normal range and no evidence of significant right lower extremity  arterial disease. right ABI is 1.08 with a toe pressure being 0.54. there was biphasic flow. The left ABI was 0.77 and the toe and  this was 0.38 with monophasic flow.The right toe brachial indicis were abnormal. The left resting ABI indicates moderate left lower extremity arterial disease in the left toe brachial index is abnormal. 01/09/17 on evaluation today patient presents with continued ulcerations noted of the right lower extremity. This does seem to be doing a little bit better but still is slough covered. She has been tolerating the dressing changes fairly well but states that the debridement does tend to bother her pain wise. No fevers, chills, nausea, or vomiting noted at this time. She does tell me that the alginate dressing that she was looking to use following her last evaluation is expensive and insurance will not cover this it's actually cheaper for her to utilize the [place]. 02/06/17 on evaluation today patient's wound appears to be doing excellent at this point in time. In fact she has been tolerating the dressing changes very well there does not even appear to be any evidence or need for debridement today. She's not having any significant pain which is good news. No fevers, chills, nausea, or vomiting noted at this time. Overall she is extremely pleased with how things have progressed. 02/20/17 on evaluation today patient appears to be doing well in regard to her right lower extremity ulcer. In fact this appears to be completely healed. She still has some dry pinpoint scab like areas noted over the lower extremity but fortunately nothing significant and none of these seem to have any wound underneath when they come off it is completely healed underneath. Nonetheless we are gonna let this work itself off not try to do anything to invasive. 03/06/17 on evaluation today patient appears to be doing extremely well in regard to her right lateral lower extremity ulcer site. She appears to  be completely healed and there is no evidence of residual ulceration at this time which is great news. Readmission: 10/08/2020 upon evaluation today patient appears to be doing somewhat poorly in regard to a wound on her right leg. She tells me about a month ago one of her dogs scratched her and then subsequently this developed into a raised area that she has been concerned with. Fortunately there does not appear to be any pain per se with regard to this area. Unfortunately there is some concern in my mind to be honest about the possibility of a cancerous lesion here. Initially the plan was to actually do a punch biopsy as I proceeded with the procedure it became apparent that that was not necessarily going to be the best way to go I ended up removing the entirety of the raised area. It was somewhat coming loose just after removing the punch anyway. . The patient does have peripheral vascular disease though she recently had a stent placed. She also has a defibrillator pacemaker at this point. She does have lymphedema and does continue to smoke. She has no desire to quit. 10/18/2020 since I last saw this patient we have actually made a referral for her to have further evaluation and treatment with the skin surgery center we also tried [facility] dermatology she really needs a Mohs surgeon. Nonetheless screens were dermatology was not taking any new patients whatsoever and at the skin surgery center in [place] this is good to be something they should be calling her to get scheduled hopefully shortly. Nonetheless she is actually doing better with the wound measuring significantly smaller despite the cancer diagnosis. Nonetheless I still think there is some work to do as far as what she is  getting need from an excision standpoint. 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 MELANEE, CORDIAL ([id number]) Electronic  Signature(s) Signed: 10/18/2020 5:18:17 PM By: Worthy Keeler PA-C Previous Signature: 10/18/2020 5:16:13 PM Version By: Worthy Keeler PA-C Entered By: Worthy Keeler on 10/18/2020 17:18:16 Kimberly [name] ([id number]) -------------------------------------------------------------------------------- Physical Exam Details Patient Name: Kimberly [name] Date of Service: 10/18/2020 9:00 AM Medical Record Number: [id number] Patient Account Number: [id number] Date of Birth/Sex: 05-May-1945 (75 y.o. F) Treating RN: Dolan Amen Primary Care Provider: Cathlean Cower Other Clinician: Referring Provider: Cathlean Cower Treating Provider/Extender: Jeri Cos Weeks in Treatment: 1 Constitutional Well-nourished and well-hydrated in no acute distress. Respiratory normal breathing without difficulty. Psychiatric this patient is able to make decisions and demonstrates good insight into disease process. Alert and Oriented x 3. pleasant and cooperative. Notes Upon inspection patient's wound bed actually showed signs of good granulation epithelization at this point. Fortunately there does not appear to be any signs of active infection which is great and overall I am extremely pleased with where we stand in fact this is significantly smaller and may even close before she gets to the skin surgeon. Electronic Signature(s) Signed: 10/18/2020 5:18:34 PM By: Worthy Keeler PA-C Entered By: Worthy Keeler on 10/18/2020 17:18:33 Kimberly [name] ([id number]) -------------------------------------------------------------------------------- Physician Orders Details Patient Name: Kimberly [name] Date of Service: 10/18/2020 9:00 AM Medical Record Number: [id number] Patient Account Number: [id number] Date of Birth/Sex: 12/08/1945 (75 y.o. F) Treating RN: Dolan Amen Primary Care Provider: Cathlean Cower Other Clinician: Referring Provider: Cathlean Cower Treating Provider/Extender: Skipper Cliche in Treatment: 1 Verbal /  Phone Orders: No Diagnosis Coding ICD-10 Coding Code Description [phone] Laceration without foreign body, right lower leg, initial encounter I89.0 Lymphedema, not elsewhere classified I73.9 Peripheral vascular disease, unspecified F17.218 Nicotine dependence, cigarettes, with other nicotine-induced disorders Follow-up Appointments o Return Appointment in 1 week. Bathing/ Shower/ Hygiene o May shower; gently cleanse wound with antibacterial soap, rinse and pat dry prior to dressing wounds Wound Treatment Wound #3 - Upper Leg Wound Laterality: Right Cleanser: Soap and Water Every Other Day/30 Days Discharge Instructions: Gently cleanse wound with antibacterial soap, rinse and pat dry prior to dressing wounds Primary Dressing: Silvercel Small 2x2 (in/in) (Generic) Every Other Day/30 Days Discharge Instructions: Apply Silvercel Small 2x2 (in/in) as instructed Primary Dressing: Zetuvit Plus Silicone Border Dressing 4x4 (in/in) (Generic) Every Other Day/30 Days Discharge Instructions: Secure silver alginate Electronic Signature(s) Signed: 10/18/2020 4:16:02 PM By: Dolan Amen RN Signed: 10/18/2020 5:23:54 PM By: Worthy Keeler PA-C Entered By: Dolan Amen on 10/18/2020 10:05:45 Kimberly [name] ([id number]) -------------------------------------------------------------------------------- Problem List Details Patient Name: Kimberly [name] Date of Service: 10/18/2020 9:00 AM Medical Record Number: [id number] Patient Account Number: [id number] Date of Birth/Sex: July 12, 1945 (75 y.o. F) Treating RN: Dolan Amen Primary Care Provider: Cathlean Cower Other Clinician: Referring Provider: Cathlean Cower Treating Provider/Extender: Skipper Cliche in Treatment: 1 Active Problems ICD-10 Encounter Code Description Active Date MDM Diagnosis C44.722 Squamous cell carcinoma of skin of right lower limb, including hip 10/18/2020 No Yes S81.811A Laceration without foreign body, right lower leg,  initial encounter 10/08/2020 No Yes I89.0 Lymphedema, not elsewhere classified 10/08/2020 No Yes I73.9 Peripheral vascular disease, unspecified 10/08/2020 No Yes F17.218 Nicotine dependence, cigarettes, with other nicotine-induced disorders 10/08/2020 No Yes Inactive Problems Resolved Problems Electronic Signature(s) Signed: 10/18/2020 5:18:06 PM By: Worthy Keeler PA-C Previous Signature: 10/18/2020 9:26:07 AM Version By:  Melburn Hake, Tameya Kuznia PA-C Entered By: Worthy Keeler on 10/18/2020 17:18:05 Kimberly [name] ([id number]) -------------------------------------------------------------------------------- Progress Note Details Patient Name: EVANIA, LYNE Date of Service: 10/18/2020 9:00 AM Medical Record Number: [id number] Patient Account Number: [id number] Date of Birth/Sex: 08/09/1945 (75 y.o. F) Treating RN: Dolan Amen Primary Care Provider: Cathlean Cower Other Clinician: Referring Provider: Cathlean Cower Treating Provider/Extender: Skipper Cliche in Treatment: 1 Subjective Chief Complaint Information obtained from Patient Right Upper Leg Ulcer History of Present Illness (HPI) 75 year old patient was seen by the nurse practitioner at the [facility] primary care for a wound which has been there on her right leg since August 2018. For a period of time she has been treated with doxycycline, clindamycin and Bactrim and the wound continues to have drainage and odor.on 11/20/2016 she was again put on Bactrim DS and referred to the wound center. Past medical history significant for hypertension, congestive heart failure, COPD, dyslipidemia, migraines, pericarditis, status post VSD repair, cholecystectomy tubal ligation, cesarean section, coronary angiogram,my tenotomy with tube placement on the right,dilated cardiomyopathy,tobacco abuse and has been a smoker for the last 35 years. She also has a history of right great saphenous vein ablation but the patient does not confirm this for sure. She has  had a venous duplex study done in August 2018 which showed no evidence of lower extremity deep or superficial venous thrombus or incompetence bilaterally. She has a arterial duplex study pending, which is going to be done later today. 12/22/16 on evaluation today patient appears to be doing fairly well in regard to her right lateral lower extremity wounds. She did undergo a right common femoral artery balloon angioplasty and stent placement which was performed on 12/17/16. Patient this is did appear to be a sufficient improvement according to the reviewed notes which is excellent news. Prior to this procedure patient did have a study of the lower extremity this was a Doppler performed on 12/08/16 this revealed moderate right lower extremity arterial disease involving the common femoral artery and iliac segment. There was also left arterial obstruction involving the iliac segment. On the left there was arterial obstruction involving the iliac segment, common femoral artery, and peroneal artery. Patient states she is still having some soreness left over and residual from the procedure. However overall she appears to be doing very well. She does have a follow-up study with vascular in the next week. 01/02/2017 -- was seen by Dr. Jacqulyn Cane -- she had an abdominal aortogram with lower extremity peripheral vascular intervention, and this showed moderate left common iliac artery stenosis. Short occlusion of the right common femoral artery with no significant infrainguinal disease. Short occlusion of the left common femoral artery with no significant infrainguinal disease and a successful drug-coated balloon angioplasty stent was placed in the right common femoral artery. She had a lower extremity arterial duplex examination done which showed the resting right ABI is within normal range and no evidence of significant right lower extremity arterial disease. right ABI is 1.08 with a toe pressure being 0.54.  there was biphasic flow. The left ABI was 0.77 and the toe and this was 0.38 with monophasic flow.The right toe brachial indicis were abnormal. The left resting ABI indicates moderate left lower extremity arterial disease in the left toe brachial index is abnormal. 01/09/17 on evaluation today patient presents with continued ulcerations noted of the right lower extremity. This does seem to be doing a little bit better but still is slough covered. She has been tolerating the dressing  changes fairly well but states that the debridement does tend to bother her pain wise. No fevers, chills, nausea, or vomiting noted at this time. She does tell me that the alginate dressing that she was looking to use following her last evaluation is expensive and insurance will not cover this it's actually cheaper for her to utilize the [place]. 02/06/17 on evaluation today patient's wound appears to be doing excellent at this point in time. In fact she has been tolerating the dressing changes very well there does not even appear to be any evidence or need for debridement today. She's not having any significant pain which is good news. No fevers, chills, nausea, or vomiting noted at this time. Overall she is extremely pleased with how things have progressed. 02/20/17 on evaluation today patient appears to be doing well in regard to her right lower extremity ulcer. In fact this appears to be completely healed. She still has some dry pinpoint scab like areas noted over the lower extremity but fortunately nothing significant and none of these seem to have any wound underneath when they come off it is completely healed underneath. Nonetheless we are gonna let this work itself off not try to do anything to invasive. 03/06/17 on evaluation today patient appears to be doing extremely well in regard to her right lateral lower extremity ulcer site. She appears to be completely healed and there is no evidence of residual ulceration at  this time which is great news. Readmission: 10/08/2020 upon evaluation today patient appears to be doing somewhat poorly in regard to a wound on her right leg. She tells me about a month ago one of her dogs scratched her and then subsequently this developed into a raised area that she has been concerned with. Fortunately there does not appear to be any pain per se with regard to this area. Unfortunately there is some concern in my mind to be honest about the possibility of a cancerous lesion here. Initially the plan was to actually do a punch biopsy as I proceeded with the procedure it became apparent that that was not necessarily going to be the best way to go I ended up removing the entirety of the raised area. It was somewhat coming loose just after removing the punch anyway. . The patient does have peripheral vascular disease though she recently had a stent placed. She also has a defibrillator pacemaker at this point. She does have lymphedema and does continue to smoke. She has no desire to quit. 10/18/2020 since I last saw this patient we have actually made a referral for her to have further evaluation and treatment with the skin surgery AMILEE, JANVIER ([id number]) center we also tried [facility] dermatology she really needs a Mohs surgeon. Nonetheless screens were dermatology was not taking any new patients whatsoever and at the skin surgery center in [place] this is good to be something they should be calling her to get scheduled hopefully shortly. Nonetheless she is actually doing better with the wound measuring significantly smaller despite the cancer diagnosis. Nonetheless I still think there is some work to do as far as what she is getting need from an excision standpoint. 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 Objective Constitutional Well-nourished and well-hydrated in no acute distress. Vitals Time Taken:  9:16 AM, Height: 61 in, Weight: 106 lbs, BMI: 20, Temperature: 98.2 F, Pulse: 60 bpm, Respiratory Rate: 18 breaths/min, Blood Pressure: 105/52 mmHg. Respiratory normal  breathing without difficulty. Psychiatric this patient is able to make decisions and demonstrates good insight into disease process. Alert and Oriented x 3. pleasant and cooperative. General Notes: Upon inspection patient's wound bed actually showed signs of good granulation epithelization at this point. Fortunately there does not appear to be any signs of active infection which is great and overall I am extremely pleased with where we stand in fact this is significantly smaller and may even close before she gets to the skin surgeon. Integumentary (Hair, Skin) Wound #3 status is Open. Original cause of wound was Other Lesion. The date acquired was: 09/07/2020. The wound has been in treatment 1 weeks. The wound is located on the Right Upper Leg. The wound measures 0.1cm length x 0.1cm width x 0.1cm depth; 0.008cm^2 area and 0.001cm^3 volume. There is Fat Layer (Subcutaneous Tissue) exposed. There is no tunneling or undermining noted. There is a small amount of sanguinous drainage noted. There is large (67-100%) red granulation within the wound bed. There is no necrotic tissue within the wound bed. Assessment Active Problems ICD-10 Squamous cell carcinoma of skin of right lower limb, including hip Laceration without foreign body, right lower leg, initial encounter Lymphedema, not elsewhere classified Peripheral vascular disease, unspecified Nicotine dependence, cigarettes, with other nicotine-induced disorders Plan Follow-up Appointments: Return Appointment in 1 week. Bathing/ Shower/ Hygiene: May shower; gently cleanse wound with antibacterial soap, rinse and pat dry prior to dressing wounds WOUND #3: - Upper Leg Wound Laterality: Right Cleanser: Soap and Water Every Other Day/30 Days Discharge Instructions: Gently  cleanse wound with antibacterial soap, rinse and pat dry prior to dressing wounds Primary Dressing: Silvercel Small 2x2 (in/in) (Generic) Every Other Day/30 Days Discharge Instructions: Apply Silvercel Small 2x2 (in/in) as instructed Primary Dressing: Zetuvit Plus Silicone Border Dressing 4x4 (in/in) (Generic) Every Other Day/30 Days Discharge Instructions: Secure silver alginate Mundo, Shilee B. ([id number]) 1. Would recommend currently that we going continue with wound care measures as before with silver cell this is doing a great job. 2. I am also going to recommend we have the patient continue with the Zetuvit border foam dressing to cover. 3. She also does still need to go to the skin surgery center for excision and this is definitely good to be necessary despite the fact this is looking better there is still risk of recurrence as we did not completely clear the edges. We will see patient back for reevaluation in 1 week here in the clinic. If anything worsens or changes patient will contact our office for additional recommendations. Electronic Signature(s) Signed: 10/18/2020 5:19:18 PM By: Worthy Keeler PA-C Entered By: Worthy Keeler on 10/18/2020 17:19:18 Kimberly [name] ([id number]) -------------------------------------------------------------------------------- SuperBill Details Patient Name: Kimberly [name] Date of Service: 10/18/2020 Medical Record Number: [id number] Patient Account Number: [id number] Date of Birth/Sex: 1945-10-09 (75 y.o. F) Treating RN: Dolan Amen Primary Care Provider: Cathlean Cower Other Clinician: Referring Provider: Cathlean Cower Treating Provider/Extender: Skipper Cliche in Treatment: 1 Diagnosis Coding ICD-10 Codes Code Description [phone] Laceration without foreign body, right lower leg, initial encounter I89.0 Lymphedema, not elsewhere classified I73.9 Peripheral vascular disease, unspecified F17.218 Nicotine dependence, cigarettes, with other  nicotine-induced disorders Facility Procedures CPT4 Code: [id number] Description: 99213 - WOUND CARE VISIT-LEV 3 EST PT Modifier: Quantity: 1 Physician Procedures CPT4 Code: 6761950 Description: 93267 - WC PHYS LEVEL 4 - EST PT Modifier: Quantity: 1 CPT4 Code: Description: ICD-10 Diagnosis Description S81.811A Laceration without foreign body, right lower leg, initial encounter I89.0 Lymphedema, not elsewhere classified  I73.9 Peripheral vascular disease, unspecified F17.218 Nicotine dependence, cigarettes, with  other nicotine-induced disorde Modifier: rs Quantity: Electronic Signature(s) Signed: 10/18/2020 5:19:45 PM By: Worthy Keeler PA-C Previous Signature: 10/18/2020 4:16:02 PM Version By: Dolan Amen RN Entered By: Worthy Keeler on 10/18/2020 17:19:45

## 2020-10-25 ENCOUNTER — Other Ambulatory Visit: Payer: Self-pay | Admitting: *Deleted

## 2020-10-25 ENCOUNTER — Encounter: Payer: Medicare Other | Attending: Physician Assistant | Admitting: Physician Assistant

## 2020-10-25 ENCOUNTER — Other Ambulatory Visit: Payer: Self-pay

## 2020-10-25 DIAGNOSIS — I739 Peripheral vascular disease, unspecified: Secondary | ICD-10-CM | POA: Insufficient documentation

## 2020-10-25 DIAGNOSIS — C44722 Squamous cell carcinoma of skin of right lower limb, including hip: Secondary | ICD-10-CM | POA: Diagnosis not present

## 2020-10-25 DIAGNOSIS — I890 Lymphedema, not elsewhere classified: Secondary | ICD-10-CM | POA: Diagnosis not present

## 2020-10-25 DIAGNOSIS — S81811A Laceration without foreign body, right lower leg, initial encounter: Secondary | ICD-10-CM | POA: Diagnosis not present

## 2020-10-25 DIAGNOSIS — F17218 Nicotine dependence, cigarettes, with other nicotine-induced disorders: Secondary | ICD-10-CM | POA: Diagnosis not present

## 2020-10-25 DIAGNOSIS — W548XXA Other contact with dog, initial encounter: Secondary | ICD-10-CM | POA: Diagnosis not present

## 2020-10-25 DIAGNOSIS — L97112 Non-pressure chronic ulcer of right thigh with fat layer exposed: Secondary | ICD-10-CM | POA: Diagnosis not present

## 2020-10-25 MED ORDER — BREZTRI AEROSPHERE 160-9-4.8 MCG/ACT IN AERO: 2.0000 | INHALATION_SPRAY | Freq: Two times a day (BID) | RESPIRATORY_TRACT | 3 refills | Status: DC

## 2020-10-25 NOTE — Progress Notes (Addendum)
Kimberly [name], Kimberly [name] ([id number]) Visit Report for 10/25/2020 Arrival Information Details Patient Name: Kimberly [name], Kimberly [name] Date of Service: 10/25/2020 8:15 AM Medical Record Number: [id number] Patient Account Number: [id number] Date of Birth/Sex: April 26, 1945 (75 y.o. F) Treating RN: Dolan Amen Primary Care Naleah Kofoed: Cathlean Cower Other Clinician: Referring Esaiah Wanless: Cathlean Cower Treating Mychele Seyller/Extender: Skipper Cliche in Treatment: 2 Visit Information History Since Last Visit Pain Present Now: No Patient Arrived: Ambulatory Arrival Time: 08:24 Accompanied By: self Transfer Assistance: None Patient Identification Verified: Yes Secondary Verification Process Completed: Yes Electronic Signature(s) Signed: 10/25/2020 1:42:44 PM By: Dolan Amen RN Entered By: Dolan Amen on 10/25/2020 08:25:05 Kimberly [name] ([id number]) -------------------------------------------------------------------------------- Clinic Level of Care Assessment Details Patient Name: Kimberly [name] Date of Service: 10/25/2020 8:15 AM Medical Record Number: [id number] Patient Account Number: [id number] Date of Birth/Sex: 05-Apr-1945 (75 y.o. F) Treating RN: Dolan Amen Primary Care Decklan Mau: Cathlean Cower Other Clinician: Referring Tristram Milian: Cathlean Cower Treating Archibald Marchetta/Extender: Skipper Cliche in Treatment: 2 Clinic Level of Care Assessment Items TOOL 4 Quantity Score X - Use when only an EandM is performed on FOLLOW-UP visit 1 0 ASSESSMENTS - Nursing Assessment / Reassessment X - Reassessment of Co-morbidities (includes updates in patient status) 1 10 X- 1 5 Reassessment of Adherence to Treatment Plan ASSESSMENTS - Wound and Skin Assessment / Reassessment []  - Simple Wound Assessment / Reassessment - one wound 0 []  - 0 Complex Wound Assessment / Reassessment - multiple wounds []  - 0 Dermatologic / Skin Assessment (not related to wound area) ASSESSMENTS - Focused Assessment []  - Circumferential Edema  Measurements - multi extremities 0 []  - 0 Nutritional Assessment / Counseling / Intervention []  - 0 Lower Extremity Assessment (monofilament, tuning fork, pulses) []  - 0 Peripheral Arterial Disease Assessment (using hand held doppler) ASSESSMENTS - Ostomy and/or Continence Assessment and Care []  - Incontinence Assessment and Management 0 []  - 0 Ostomy Care Assessment and Management (repouching, etc.) PROCESS - Coordination of Care X - Simple Patient / Family Education for ongoing care 1 15 []  - 0 Complex (extensive) Patient / Family Education for ongoing care []  - 0 Staff obtains Programmer, systems, Records, Test Results / Process Orders []  - 0 Staff telephones HHA, Nursing Homes / Clarify orders / etc []  - 0 Routine Transfer to another Facility (non-emergent condition) []  - 0 Routine Hospital Admission (non-emergent condition) []  - 0 New Admissions / Biomedical engineer / Ordering NPWT, Apligraf, etc. []  - 0 Emergency Hospital Admission (emergent condition) X- 1 10 Simple Discharge Coordination []  - 0 Complex (extensive) Discharge Coordination PROCESS - Special Needs []  - Pediatric / Minor Patient Management 0 []  - 0 Isolation Patient Management []  - 0 Hearing / Language / Visual special needs []  - 0 Assessment of Community assistance (transportation, D/C planning, etc.) []  - 0 Additional assistance / Altered mentation []  - 0 Support Surface(s) Assessment (bed, cushion, seat, etc.) INTERVENTIONS - Wound Cleansing / Measurement Kimberly [name], Kimberly B. ([id number]) []  - 0 Simple Wound Cleansing - one wound []  - 0 Complex Wound Cleansing - multiple wounds []  - 0 Wound Imaging (photographs - any number of wounds) []  - 0 Wound Tracing (instead of photographs) []  - 0 Simple Wound Measurement - one wound []  - 0 Complex Wound Measurement - multiple wounds INTERVENTIONS - Wound Dressings []  - Small Wound Dressing one or multiple wounds 0 []  - 0 Medium Wound Dressing one or  multiple wounds []  - 0 Large Wound Dressing one or multiple wounds []  - 0 Application of Medications -  topical []  - 0 Application of Medications - injection INTERVENTIONS - Miscellaneous []  - External ear exam 0 []  - 0 Specimen Collection (cultures, biopsies, blood, body fluids, etc.) []  - 0 Specimen(s) / Culture(s) sent or taken to Lab for analysis []  - 0 Patient Transfer (multiple staff / [name] Lift / Similar devices) []  - 0 Simple Staple / Suture removal (25 or less) []  - 0 Complex Staple / Suture removal (26 or more) []  - 0 Hypo / Hyperglycemic Management (close monitor of Blood Glucose) []  - 0 Ankle / Brachial Index (ABI) - do not check if billed separately X- 1 5 Vital Signs Has the patient been seen at the hospital within the last three years: Yes Total Score: 45 Level Of Care: New/Established - Level 2 Electronic Signature(s) Signed: 10/25/2020 1:42:44 PM By: Dolan Amen RN Entered By: Dolan Amen on 10/25/2020 08:46:53 Kimberly [name] ([id number]) -------------------------------------------------------------------------------- Encounter Discharge Information Details Patient Name: Kimberly [name] Date of Service: 10/25/2020 8:15 AM Medical Record Number: [id number] Patient Account Number: [id number] Date of Birth/Sex: [birth year] (75 y.o. F) Treating RN: Dolan Amen Primary Care Meshach Perry: Cathlean Cower Other Clinician: Referring Keni Wafer: Cathlean Cower Treating Elye Harmsen/Extender: Skipper Cliche in Treatment: 2 Encounter Discharge Information Items Discharge Condition: Stable Ambulatory Status: Ambulatory Discharge Destination: Home Transportation: Private Auto Accompanied By: self Schedule Follow-up Appointment: No Clinical Summary of Care: Electronic Signature(s) Signed: 10/25/2020 1:42:44 PM By: Dolan Amen RN Entered By: Dolan Amen on 10/25/2020 08:47:38 Kimberly [name]  ([id number]) -------------------------------------------------------------------------------- Lower Extremity Assessment Details Patient Name: Kimberly [name] Date of Service: 10/25/2020 8:15 AM Medical Record Number: [id number] Patient Account Number: [id number] Date of Birth/Sex: 1946-01-18 (75 y.o. F) Treating RN: Dolan Amen Primary Care Anquinette Pierro: Cathlean Cower Other Clinician: Referring Dino Borntreger: Cathlean Cower Treating Lorena Clearman/Extender: Jeri Cos Weeks in Treatment: 2 Electronic Signature(s) Signed: 10/25/2020 1:42:44 PM By: Dolan Amen RN Entered By: Dolan Amen on 10/25/2020 08:29:46 Kimberly [name] ([id number]) -------------------------------------------------------------------------------- Multi Wound Chart Details Patient Name: Kimberly [name] Date of Service: 10/25/2020 8:15 AM Medical Record Number: [id number] Patient Account Number: [id number] Date of Birth/Sex: 1945/02/10 (75 y.o. F) Treating RN: Dolan Amen Primary Care Briana Newman: Cathlean Cower Other Clinician: Referring Badr Piedra: Cathlean Cower Treating Legend Pecore/Extender: Skipper Cliche in Treatment: 2 Vital Signs Height(in): 61 Pulse(bpm): 49 Weight(lbs): 106 Blood Pressure(mmHg): 103/45 Body Mass Index(BMI): 20 Temperature(F): 98.1 Respiratory Rate(breaths/min): 16 Photos: [N/A:N/A] Wound Location: Right Upper Leg N/A N/A Wounding Event: Other Lesion N/A N/A Primary Etiology: Lesion N/A N/A Comorbid History: Anemia, Asthma, Chronic N/A N/A Obstructive Pulmonary Disease (COPD), Congestive Heart Failure, Hypertension, Peripheral Arterial Disease Date Acquired: 09/07/2020 N/A N/A Weeks of Treatment: 2 N/A N/A Wound Status: Open N/A N/A Measurements L x W x D (cm) 0x0x0 N/A N/A Area (cm) : 0 N/A N/A Volume (cm) : 0 N/A N/A % Reduction in Area: 100.00% N/A N/A % Reduction in Volume: 100.00% N/A N/A Classification: Full Thickness Without Exposed N/A N/A Support Structures Exudate Amount: None Present  N/A N/A Granulation Amount: None Present (0%) N/A N/A Necrotic Amount: None Present (0%) N/A N/A Exposed Structures: Fat Layer (Subcutaneous Tissue): N/A N/A Yes Fascia: No Tendon: No Muscle: No Joint: No Bone: No Epithelialization: Large (67-100%) N/A N/A Treatment Notes Electronic Signature(s) Signed: 10/25/2020 1:42:44 PM By: Dolan Amen RN Entered By: Dolan Amen on 10/25/2020 08:45:35 Kimberly [name] ([id number]) -------------------------------------------------------------------------------- Multi-Disciplinary Care Plan Details Patient Name: Kimberly [name] Date of Service: 10/25/2020 8:15 AM Medical Record Number: [id number] Patient Account Number: [id number] Date of Birth/Sex:  December 16, 1945 (75 y.o. F) Treating RN: Dolan Amen Primary Care Jael Kostick: Cathlean Cower Other Clinician: Referring Findley Vi: Cathlean Cower Treating Allianna Beaubien/Extender: Jeri Cos Weeks in Treatment: 2 Active Inactive Electronic Signature(s) Signed: 10/25/2020 1:42:44 PM By: Dolan Amen RN Entered By: Dolan Amen on 10/25/2020 08:45:11 Kimberly [name] ([id number]) -------------------------------------------------------------------------------- Pain Assessment Details Patient Name: Kimberly [name] Date of Service: 10/25/2020 8:15 AM Medical Record Number: [id number] Patient Account Number: [id number] Date of Birth/Sex: Aug 20, 1945 (75 y.o. F) Treating RN: Dolan Amen Primary Care Albertia Carvin: Cathlean Cower Other Clinician: Referring Jannet Calip: Cathlean Cower Treating Shenouda Genova/Extender: Skipper Cliche in Treatment: 2 Active Problems Location of Pain Severity and Description of Pain Patient Has Paino No Site Locations Pain Management and Medication Current Pain Management: Electronic Signature(s) Signed: 10/25/2020 1:42:44 PM By: Dolan Amen RN Entered By: Dolan Amen on 10/25/2020 08:27:00 Kimberly [name]  ([id number]) -------------------------------------------------------------------------------- Patient/Caregiver Education Details Patient Name: Kimberly [name] Date of Service: 10/25/2020 8:15 AM Medical Record Number: [id number] Patient Account Number: [id number] Date of Birth/Gender: [birth year] (75 y.o. F) Treating RN: Dolan Amen Primary Care Physician: Cathlean Cower Other Clinician: Referring Physician: Cathlean Cower Treating Physician/Extender: Skipper Cliche in Treatment: 2 Education Assessment Education Provided To: Patient Education Topics Provided Notes discharge instructions Electronic Signature(s) Signed: 10/25/2020 1:42:44 PM By: Dolan Amen RN Entered By: Dolan Amen on 10/25/2020 08:47:11 Kimberly [name] ([id number]) -------------------------------------------------------------------------------- Wound Assessment Details Patient Name: Kimberly [name] Date of Service: 10/25/2020 8:15 AM Medical Record Number: [id number] Patient Account Number: [id number] Date of Birth/Sex: Jun 21, 1945 (75 y.o. F) Treating RN: Dolan Amen Primary Care Bricia Taher: Cathlean Cower Other Clinician: Referring Keelen Quevedo: Cathlean Cower Treating Makensie Mulhall/Extender: Jeri Cos Weeks in Treatment: 2 Wound Status Wound Number: 3 Primary Lesion Etiology: Wound Location: Right Upper Leg Wound Open Wounding Event: Other Lesion Status: Date Acquired: 09/07/2020 Comorbid Anemia, Asthma, Chronic Obstructive Pulmonary Disease Weeks Of Treatment: 2 History: (COPD), Congestive Heart Failure, Hypertension, Clustered Wound: No Peripheral Arterial Disease Photos Wound Measurements Length: (cm) 0 Width: (cm) 0 Depth: (cm) 0 Area: (cm) 0 Volume: (cm) 0 % Reduction in Area: 100% % Reduction in Volume: 100% Epithelialization: Large (67-100%) Tunneling: No Undermining: No Wound Description Classification: Full Thickness Without Exposed Support Structure Exudate Amount: None Present s Foul Odor  After Cleansing: No Slough/Fibrino No Wound Bed Granulation Amount: None Present (0%) Exposed Structure Necrotic Amount: None Present (0%) Fascia Exposed: No Fat Layer (Subcutaneous Tissue) Exposed: Yes Tendon Exposed: No Muscle Exposed: No Joint Exposed: No Bone Exposed: No Electronic Signature(s) Signed: 10/25/2020 1:42:44 PM By: Dolan Amen RN Entered By: Dolan Amen on 10/25/2020 08:29:30 Kimberly [name] ([id number]) -------------------------------------------------------------------------------- [place] Details Patient Name: Kimberly [name] Date of Service: 10/25/2020 8:15 AM Medical Record Number: [id number] Patient Account Number: [id number] Date of Birth/Sex: 16-Jul-1945 (75 y.o. F) Treating RN: Dolan Amen Primary Care Alessa Mazur: Cathlean Cower Other Clinician: Referring Patryk Conant: Cathlean Cower Treating Annabella Elford/Extender: Skipper Cliche in Treatment: 2 Vital Signs Time Taken: 08:25 Temperature (F): 98.1 Height (in): 61 Pulse (bpm): 49 Weight (lbs): 106 Respiratory Rate (breaths/min): 16 Body Mass Index (BMI): 20 Blood Pressure (mmHg): 103/45 Reference Range: 80 - 120 mg / dl Electronic Signature(s) Signed: 10/25/2020 1:42:44 PM By: Dolan Amen RN Entered By: Dolan Amen on 10/25/2020 08:26:47

## 2020-10-25 NOTE — Progress Notes (Addendum)
Kimberly [name] ([id number]) Visit Report for 10/25/2020 Chief Complaint Document Details Patient Name: Kimberly [name], Kimberly [name] Date of Service: 10/25/2020 8:15 AM Medical Record Number: [id number] Patient Account Number: [id number] Date of Birth/Sex: September 16, 1945 (75 y.o. F) Treating RN: Dolan Amen Primary Care Provider: Cathlean Cower Other Clinician: Referring Provider: Cathlean Cower Treating Provider/Extender: Skipper Cliche in Treatment: 2 Information Obtained from: Patient Chief Complaint Right Upper Leg Ulcer Electronic Signature(s) Signed: 10/25/2020 8:18:11 AM By: Worthy Keeler PA-C Entered By: Worthy Keeler on 10/25/2020 08:18:10 Kimberly [name] ([id number]) -------------------------------------------------------------------------------- HPI Details Patient Name: Kimberly [name] Date of Service: 10/25/2020 8:15 AM Medical Record Number: [id number] Patient Account Number: [id number] Date of Birth/Sex: [birth year] (75 y.o. F) Treating RN: Dolan Amen Primary Care Provider: Cathlean Cower Other Clinician: Referring Provider: Cathlean Cower Treating Provider/Extender: Skipper Cliche in Treatment: 2 History of Present Illness HPI Description: 75 year old patient was seen by the nurse practitioner at the [facility] primary care for a wound which has been there on her right leg since August 2018. For a period of time she has been treated with doxycycline, clindamycin and Bactrim and the wound continues to have drainage and odor.on 11/20/2016 she was again put on Bactrim DS and referred to the wound center. Past medical history significant for hypertension, congestive heart failure, COPD, dyslipidemia, migraines, pericarditis, status post VSD repair, cholecystectomy tubal ligation, cesarean section, coronary angiogram,my tenotomy with tube placement on the right,dilated cardiomyopathy,tobacco abuse and has been a smoker for the last 35 years. She also has a history of right great saphenous vein  ablation but the patient does not confirm this for sure. She has had a venous duplex study done in August 2018 which showed no evidence of lower extremity deep or superficial venous thrombus or incompetence bilaterally. She has a arterial duplex study pending, which is going to be done later today. 12/22/16 on evaluation today patient appears to be doing fairly well in regard to her right lateral lower extremity wounds. She did undergo a right common femoral artery balloon angioplasty and stent placement which was performed on 12/17/16. Patient this is did appear to be a sufficient improvement according to the reviewed notes which is excellent news. Prior to this procedure patient did have a study of the lower extremity this was a Doppler performed on 12/08/16 this revealed moderate right lower extremity arterial disease involving the common femoral artery and iliac segment. There was also left arterial obstruction involving the iliac segment. On the left there was arterial obstruction involving the iliac segment, common femoral artery, and peroneal artery. Patient states she is still having some soreness left over and residual from the procedure. However overall she appears to be doing very well. She does have a follow-up study with vascular in the next week. 01/02/2017 -- was seen by Dr. Jacqulyn Cane -- she had an abdominal aortogram with lower extremity peripheral vascular intervention, and this showed moderate left common iliac artery stenosis. Short occlusion of the right common femoral artery with no significant infrainguinal disease. Short occlusion of the left common femoral artery with no significant infrainguinal disease and a successful drug-coated balloon angioplasty stent was placed in the right common femoral artery. She had a lower extremity arterial duplex examination done which showed the resting right ABI is within normal range and no evidence of significant right lower extremity  arterial disease. right ABI is 1.08 with a toe pressure being 0.54. there was biphasic flow. The left ABI was 0.77 and the toe and  this was 0.38 with monophasic flow.The right toe brachial indicis were abnormal. The left resting ABI indicates moderate left lower extremity arterial disease in the left toe brachial index is abnormal. 01/09/17 on evaluation today patient presents with continued ulcerations noted of the right lower extremity. This does seem to be doing a little bit better but still is slough covered. She has been tolerating the dressing changes fairly well but states that the debridement does tend to bother her pain wise. No fevers, chills, nausea, or vomiting noted at this time. She does tell me that the alginate dressing that she was looking to use following her last evaluation is expensive and insurance will not cover this it's actually cheaper for her to utilize the [place]. 02/06/17 on evaluation today patient's wound appears to be doing excellent at this point in time. In fact she has been tolerating the dressing changes very well there does not even appear to be any evidence or need for debridement today. She's not having any significant pain which is good news. No fevers, chills, nausea, or vomiting noted at this time. Overall she is extremely pleased with how things have progressed. 02/20/17 on evaluation today patient appears to be doing well in regard to her right lower extremity ulcer. In fact this appears to be completely healed. She still has some dry pinpoint scab like areas noted over the lower extremity but fortunately nothing significant and none of these seem to have any wound underneath when they come off it is completely healed underneath. Nonetheless we are gonna let this work itself off not try to do anything to invasive. 03/06/17 on evaluation today patient appears to be doing extremely well in regard to her right lateral lower extremity ulcer site. She appears to  be completely healed and there is no evidence of residual ulceration at this time which is great news. Readmission: 10/08/2020 upon evaluation today patient appears to be doing somewhat poorly in regard to a wound on her right leg. She tells me about a month ago one of her dogs scratched her and then subsequently this developed into a raised area that she has been concerned with. Fortunately there does not appear to be any pain per se with regard to this area. Unfortunately there is some concern in my mind to be honest about the possibility of a cancerous lesion here. Initially the plan was to actually do a punch biopsy as I proceeded with the procedure it became apparent that that was not necessarily going to be the best way to go I ended up removing the entirety of the raised area. It was somewhat coming loose just after removing the punch anyway. . The patient does have peripheral vascular disease though she recently had a stent placed. She also has a defibrillator pacemaker at this point. She does have lymphedema and does continue to smoke. She has no desire to quit. 10/18/2020 since I last saw this patient we have actually made a referral for her to have further evaluation and treatment with the skin surgery center we also tried [facility] dermatology she really needs a Mohs surgeon. Nonetheless screens were dermatology was not taking any new patients whatsoever and at the skin surgery center in [place] this is good to be something they should be calling her to get scheduled hopefully shortly. Nonetheless she is actually doing better with the wound measuring significantly smaller despite the cancer diagnosis. Nonetheless I still think there is some work to do as far as what she is  getting need from an excision standpoint. 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 Kimberly [name], Kimberly [name] ([id number]) Sorry 10/25/2020 upon  evaluation today patient presents for follow-up with regard to her right thigh ulcer. This actually appears to be completely healed. Again this was diagnosed as being positive for squamous cell carcinoma. Subsequently it still healed after we removed everything that we did. Unfortunately the margins were not clear so she still does have to go to the skin surgery center and she will be seeing them on 25 October. Electronic Signature(s) Signed: 10/25/2020 8:54:14 AM By: Worthy Keeler PA-C Entered By: Worthy Keeler on 10/25/2020 08:54:13 Kimberly [name] ([id number]) -------------------------------------------------------------------------------- Physical Exam Details Patient Name: Kimberly [name] Date of Service: 10/25/2020 8:15 AM Medical Record Number: [id number] Patient Account Number: [id number] Date of Birth/Sex: [birth year] (75 y.o. F) Treating RN: Dolan Amen Primary Care Provider: Cathlean Cower Other Clinician: Referring Provider: Cathlean Cower Treating Provider/Extender: Jeri Cos Weeks in Treatment: 2 Constitutional Well-nourished and well-hydrated in no acute distress. Respiratory normal breathing without difficulty. Psychiatric this patient is able to make decisions and demonstrates good insight into disease process. Alert and Oriented x 3. pleasant and cooperative. Notes Patient's wound today actually pretty much appears to be completely healed I did not see anything open or draining I do not think there is any further bandaging that she really needs at this point. The biggest thing is she does need to still go for the surgery in order to clear the margin so that this does not continue to recur or potentially spread. Electronic Signature(s) Signed: 10/25/2020 8:54:32 AM By: Worthy Keeler PA-C Entered By: Worthy Keeler on 10/25/2020 08:54:32 Kimberly [name] ([id number]) -------------------------------------------------------------------------------- Physician Orders  Details Patient Name: Kimberly [name] Date of Service: 10/25/2020 8:15 AM Medical Record Number: [id number] Patient Account Number: [id number] Date of Birth/Sex: [birth year] (75 y.o. F) Treating RN: Dolan Amen Primary Care Provider: Cathlean Cower Other Clinician: Referring Provider: Cathlean Cower Treating Provider/Extender: Skipper Cliche in Treatment: 2 Verbal / Phone Orders: No Diagnosis Coding ICD-10 Coding Code Description [phone] Squamous cell carcinoma of skin of right lower limb, including hip S81.811A Laceration without foreign body, right lower leg, initial encounter I89.0 Lymphedema, not elsewhere classified I73.9 Peripheral vascular disease, unspecified F17.218 Nicotine dependence, cigarettes, with other nicotine-induced disorders Discharge From [facility] Services o Discharge from [place] Treatment Complete o Transfer Care to other provider - Follow up with the skin surgery center on Oct. 25 Electronic Signature(s) Signed: 10/25/2020 1:42:44 PM By: Dolan Amen RN Signed: 10/26/2020 9:28:39 AM By: Worthy Keeler PA-C Entered By: Dolan Amen on 10/25/2020 08:46:38 Kimberly [name] ([id number]) -------------------------------------------------------------------------------- Problem List Details Patient Name: Kimberly [name] Date of Service: 10/25/2020 8:15 AM Medical Record Number: [id number] Patient Account Number: [id number] Date of Birth/Sex: [birth year] (75 y.o. F) Treating RN: Dolan Amen Primary Care Provider: Cathlean Cower Other Clinician: Referring Provider: Cathlean Cower Treating Provider/Extender: Skipper Cliche in Treatment: 2 Active Problems ICD-10 Encounter Code Description Active Date MDM Diagnosis C44.722 Squamous cell carcinoma of skin of right lower limb, including hip 10/18/2020 No Yes S81.811A Laceration without foreign body, right lower leg, initial encounter 10/08/2020 No Yes I89.0 Lymphedema, not elsewhere classified 10/08/2020 No  Yes I73.9 Peripheral vascular disease, unspecified 10/08/2020 No Yes F17.218 Nicotine dependence, cigarettes, with other nicotine-induced disorders 10/08/2020 No Yes Inactive Problems Resolved Problems Electronic Signature(s) Signed: 10/25/2020 8:18:05 AM By: Worthy Keeler PA-C  Entered By: Worthy Keeler on 10/25/2020 08:18:04 Kimberly [name] ([id number]) -------------------------------------------------------------------------------- Progress Note Details Patient Name: Kimberly [name] Date of Service: 10/25/2020 8:15 AM Medical Record Number: [id number] Patient Account Number: [id number] Date of Birth/Sex: 16-Nov-1945 (75 y.o. F) Treating RN: Dolan Amen Primary Care Provider: Cathlean Cower Other Clinician: Referring Provider: Cathlean Cower Treating Provider/Extender: Skipper Cliche in Treatment: 2 Subjective Chief Complaint Information obtained from Patient Right Upper Leg Ulcer History of Present Illness (HPI) 75 year old patient was seen by the nurse practitioner at the [facility] primary care for a wound which has been there on her right leg since August 2018. For a period of time she has been treated with doxycycline, clindamycin and Bactrim and the wound continues to have drainage and odor.on 11/20/2016 she was again put on Bactrim DS and referred to the wound center. Past medical history significant for hypertension, congestive heart failure, COPD, dyslipidemia, migraines, pericarditis, status post VSD repair, cholecystectomy tubal ligation, cesarean section, coronary angiogram,my tenotomy with tube placement on the right,dilated cardiomyopathy,tobacco abuse and has been a smoker for the last 35 years. She also has a history of right great saphenous vein ablation but the patient does not confirm this for sure. She has had a venous duplex study done in August 2018 which showed no evidence of lower extremity deep or superficial venous thrombus or incompetence bilaterally. She has a  arterial duplex study pending, which is going to be done later today. 12/22/16 on evaluation today patient appears to be doing fairly well in regard to her right lateral lower extremity wounds. She did undergo a right common femoral artery balloon angioplasty and stent placement which was performed on 12/17/16. Patient this is did appear to be a sufficient improvement according to the reviewed notes which is excellent news. Prior to this procedure patient did have a study of the lower extremity this was a Doppler performed on 12/08/16 this revealed moderate right lower extremity arterial disease involving the common femoral artery and iliac segment. There was also left arterial obstruction involving the iliac segment. On the left there was arterial obstruction involving the iliac segment, common femoral artery, and peroneal artery. Patient states she is still having some soreness left over and residual from the procedure. However overall she appears to be doing very well. She does have a follow-up study with vascular in the next week. 01/02/2017 -- was seen by Dr. Jacqulyn Cane -- she had an abdominal aortogram with lower extremity peripheral vascular intervention, and this showed moderate left common iliac artery stenosis. Short occlusion of the right common femoral artery with no significant infrainguinal disease. Short occlusion of the left common femoral artery with no significant infrainguinal disease and a successful drug-coated balloon angioplasty stent was placed in the right common femoral artery. She had a lower extremity arterial duplex examination done which showed the resting right ABI is within normal range and no evidence of significant right lower extremity arterial disease. right ABI is 1.08 with a toe pressure being 0.54. there was biphasic flow. The left ABI was 0.77 and the toe and this was 0.38 with monophasic flow.The right toe brachial indicis were abnormal. The left resting ABI  indicates moderate left lower extremity arterial disease in the left toe brachial index is abnormal. 01/09/17 on evaluation today patient presents with continued ulcerations noted of the right lower extremity. This does seem to be doing a little bit better but still is slough covered. She has been tolerating the dressing changes fairly well but  states that the debridement does tend to bother her pain wise. No fevers, chills, nausea, or vomiting noted at this time. She does tell me that the alginate dressing that she was looking to use following her last evaluation is expensive and insurance will not cover this it's actually cheaper for her to utilize the [place]. 02/06/17 on evaluation today patient's wound appears to be doing excellent at this point in time. In fact she has been tolerating the dressing changes very well there does not even appear to be any evidence or need for debridement today. She's not having any significant pain which is good news. No fevers, chills, nausea, or vomiting noted at this time. Overall she is extremely pleased with how things have progressed. 02/20/17 on evaluation today patient appears to be doing well in regard to her right lower extremity ulcer. In fact this appears to be completely healed. She still has some dry pinpoint scab like areas noted over the lower extremity but fortunately nothing significant and none of these seem to have any wound underneath when they come off it is completely healed underneath. Nonetheless we are gonna let this work itself off not try to do anything to invasive. 03/06/17 on evaluation today patient appears to be doing extremely well in regard to her right lateral lower extremity ulcer site. She appears to be completely healed and there is no evidence of residual ulceration at this time which is great news. Readmission: 10/08/2020 upon evaluation today patient appears to be doing somewhat poorly in regard to a wound on her right leg. She  tells me about a month ago one of her dogs scratched her and then subsequently this developed into a raised area that she has been concerned with. Fortunately there does not appear to be any pain per se with regard to this area. Unfortunately there is some concern in my mind to be honest about the possibility of a cancerous lesion here. Initially the plan was to actually do a punch biopsy as I proceeded with the procedure it became apparent that that was not necessarily going to be the best way to go I ended up removing the entirety of the raised area. It was somewhat coming loose just after removing the punch anyway. . The patient does have peripheral vascular disease though she recently had a stent placed. She also has a defibrillator pacemaker at this point. She does have lymphedema and does continue to smoke. She has no desire to quit. 10/18/2020 since I last saw this patient we have actually made a referral for her to have further evaluation and treatment with the skin surgery Kimberly [name], Kimberly [name] ([id number]) center we also tried [facility] dermatology she really needs a Mohs surgeon. Nonetheless screens were dermatology was not taking any new patients whatsoever and at the skin surgery center in [place] this is good to be something they should be calling her to get scheduled hopefully shortly. Nonetheless she is actually doing better with the wound measuring significantly smaller despite the cancer diagnosis. Nonetheless I still think there is some work to do as far as what she is getting need from an excision standpoint. 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 Sorry 10/25/2020 upon evaluation today patient presents for follow-up with regard to her right thigh ulcer. This actually appears to be completely healed. Again this was diagnosed as being positive for squamous cell carcinoma. Subsequently it still healed after we  removed everything  that we did. Unfortunately the margins were not clear so she still does have to go to the skin surgery center and she will be seeing them on 25 October. Objective Constitutional Well-nourished and well-hydrated in no acute distress. Vitals Time Taken: 8:25 AM, Height: 61 in, Weight: 106 lbs, BMI: 20, Temperature: 98.1 F, Pulse: 49 bpm, Respiratory Rate: 16 breaths/min, Blood Pressure: 103/45 mmHg. Respiratory normal breathing without difficulty. Psychiatric this patient is able to make decisions and demonstrates good insight into disease process. Alert and Oriented x 3. pleasant and cooperative. General Notes: Patient's wound today actually pretty much appears to be completely healed I did not see anything open or draining I do not think there is any further bandaging that she really needs at this point. The biggest thing is she does need to still go for the surgery in order to clear the margin so that this does not continue to recur or potentially spread. Integumentary (Hair, Skin) Wound #3 status is Open. Original cause of wound was Other Lesion. The date acquired was: 09/07/2020. The wound has been in treatment 2 weeks. The wound is located on the Right Upper Leg. The wound measures 0cm length x 0cm width x 0cm depth; 0cm^2 area and 0cm^3 volume. There is Fat Layer (Subcutaneous Tissue) exposed. There is no tunneling or undermining noted. There is a none present amount of drainage noted. There is no granulation within the wound bed. There is no necrotic tissue within the wound bed. Assessment Active Problems ICD-10 Squamous cell carcinoma of skin of right lower limb, including hip Laceration without foreign body, right lower leg, initial encounter Lymphedema, not elsewhere classified Peripheral vascular disease, unspecified Nicotine dependence, cigarettes, with other nicotine-induced disorders Plan Discharge From [facility] Services: Discharge from [place]  Treatment Complete Transfer Care to other provider - Follow up with the skin surgery center on Oct. 25 1. Would recommend that we going discontinue wound care services for the time being the patient is completely healed which is great news nonetheless as we stated above she still needs definitive clearing of the margins as far as the squamous cell carcinoma is concerned. 2. With regard to follow-up depending on what they are able to do or not do it they can suture this closed and it heals I think she will do fine. If Kimberly [name], Kimberly [name] ([id number]) not notes a larger wound that has to remain open I will be happy to see her back she could even be a candidate for a wound VAC depending on the depth of what I have to do. We will see what they say when she goes to the skin surgery center. We will see her back for follow-up visit as needed at this point. Electronic Signature(s) Signed: 10/25/2020 8:55:13 AM By: Worthy Keeler PA-C Entered By: Worthy Keeler on 10/25/2020 08:55:13 Kimberly [name] ([id number]) -------------------------------------------------------------------------------- SuperBill Details Patient Name: Kimberly [name] Date of Service: 10/25/2020 Medical Record Number: [id number] Patient Account Number: [id number] Date of Birth/Sex: 1945-12-21 (75 y.o. F) Treating RN: Dolan Amen Primary Care Provider: Cathlean Cower Other Clinician: Referring Provider: Cathlean Cower Treating Provider/Extender: Skipper Cliche in Treatment: 2 Diagnosis Coding ICD-10 Codes Code Description [phone] Squamous cell carcinoma of skin of right lower limb, including hip S81.811A Laceration without foreign body, right lower leg, initial encounter I89.0 Lymphedema, not elsewhere classified I73.9 Peripheral vascular disease, unspecified F17.218 Nicotine dependence, cigarettes, with other nicotine-induced disorders Facility Procedures CPT4 Code: [id number] Description: [phone] - WOUND CARE VISIT-LEV 2 EST  PT Modifier: Quantity: 1 Physician Procedures CPT4 Code: 8676195 Description: 09326 - WC PHYS LEVEL 3 - EST PT Modifier: Quantity: 1 CPT4 Code: Description: ICD-10 Diagnosis Description C44.722 Squamous cell carcinoma of skin of right lower limb, including hip S81.811A Laceration without foreign body, right lower leg, initial encounter I89.0 Lymphedema, not elsewhere classified I73.9 Peripheral  vascular disease, unspecified Modifier: Quantity: Electronic Signature(s) Signed: 10/25/2020 8:57:31 AM By: Worthy Keeler PA-C Entered By: Worthy Keeler on 10/25/2020 08:57:30

## 2020-11-12 ENCOUNTER — Encounter: Payer: Self-pay | Admitting: Internal Medicine

## 2020-11-12 ENCOUNTER — Other Ambulatory Visit: Payer: Self-pay | Admitting: Internal Medicine

## 2020-11-13 DIAGNOSIS — C44722 Squamous cell carcinoma of skin of right lower limb, including hip: Secondary | ICD-10-CM | POA: Diagnosis not present

## 2020-11-14 ENCOUNTER — Telehealth: Payer: Self-pay | Admitting: Internal Medicine

## 2020-11-14 MED ORDER — HYDROCOD POLST-CPM POLST ER 10-8 MG/5ML PO SUER: 5.0000 mL | Freq: Two times a day (BID) | ORAL | 0 refills | Status: DC | PRN

## 2020-11-14 NOTE — Telephone Encounter (Signed)
Ok to let pt know  - ok for this a this time, but this will need to be the last refill, as we normally do not continue this as a long term refilled medication

## 2020-11-14 NOTE — Telephone Encounter (Signed)
1.Medication Requested: chlorpheniramine-HYDROcodone (TUSSIONEX PENNKINETIC ER) 10-8 MG/5ML SUER  2. Pharmacy (Name, [place], [facility]):  [place], [place] Phone:  [phone]      3. On Med List: Y  4. Last Visit with PCP: 4.19.2022  5. Next visit date with PCP: n/a   Agent: Please be advised that RX refills may take up to 3 business days. We ask that you follow-up with your pharmacy.

## 2020-11-14 NOTE — Telephone Encounter (Signed)
Already addressed today

## 2020-12-05 ENCOUNTER — Encounter (HOSPITAL_COMMUNITY): Payer: Self-pay

## 2020-12-05 ENCOUNTER — Other Ambulatory Visit (HOSPITAL_COMMUNITY): Payer: Self-pay | Admitting: Cardiology

## 2020-12-17 ENCOUNTER — Ambulatory Visit (INDEPENDENT_AMBULATORY_CARE_PROVIDER_SITE_OTHER): Payer: Medicare Other

## 2020-12-17 ENCOUNTER — Encounter (HOSPITAL_COMMUNITY): Payer: Self-pay

## 2020-12-17 DIAGNOSIS — I420 Dilated cardiomyopathy: Secondary | ICD-10-CM | POA: Diagnosis not present

## 2020-12-17 DIAGNOSIS — C44722 Squamous cell carcinoma of skin of right lower limb, including hip: Secondary | ICD-10-CM | POA: Diagnosis not present

## 2020-12-17 LAB — CUP PACEART REMOTE DEVICE CHECK
Battery Remaining Longevity: 90 mo
Battery Remaining Percentage: 100 %
Brady Statistic RA Percent Paced: 49 %
Brady Statistic RV Percent Paced: 93 %
Date Time Interrogation Session: 20221128041100
HighPow Impedance: 64 Ohm
Implantable Lead Implant Date: 20190226
Implantable Lead Implant Date: 20190226
Implantable Lead Implant Date: 20190226
Implantable Lead Location: 753858
Implantable Lead Location: 753859
Implantable Lead Location: 753860
Implantable Lead Model: 292
Implantable Lead Model: 4671
Implantable Lead Model: 7740
Implantable Lead Serial Number: 444536
Implantable Lead Serial Number: 726363
Implantable Lead Serial Number: 805938
Implantable Pulse Generator Implant Date: 20190226
Lead Channel Impedance Value: 424 Ohm
Lead Channel Impedance Value: 530 Ohm
Lead Channel Impedance Value: 784 Ohm
Lead Channel Setting Pacing Amplitude: 2 V
Lead Channel Setting Pacing Amplitude: 2.5 V
Lead Channel Setting Pacing Amplitude: 2.6 V
Lead Channel Setting Pacing Pulse Width: 0.4 ms
Lead Channel Setting Pacing Pulse Width: 0.4 ms
Lead Channel Setting Sensing Sensitivity: 0.5 mV
Lead Channel Setting Sensing Sensitivity: 1 mV
Pulse Gen Serial Number: 204176

## 2020-12-20 ENCOUNTER — Other Ambulatory Visit (HOSPITAL_COMMUNITY): Payer: Self-pay | Admitting: *Deleted

## 2020-12-20 MED ORDER — ENTRESTO 24-26 MG PO TABS: 1.0000 | ORAL_TABLET | Freq: Two times a day (BID) | ORAL | 3 refills | Status: DC

## 2020-12-25 NOTE — Progress Notes (Signed)
Remote ICD transmission.   

## 2020-12-26 ENCOUNTER — Other Ambulatory Visit: Payer: Self-pay

## 2020-12-26 ENCOUNTER — Ambulatory Visit (HOSPITAL_COMMUNITY)
Admission: RE | Admit: 2020-12-26 | Discharge: 2020-12-26 | Disposition: A | Discharge status: Home / Self Care | Payer: Medicare Other | Source: Ambulatory Visit | Attending: Cardiology | Admitting: Cardiology

## 2020-12-26 ENCOUNTER — Encounter (HOSPITAL_COMMUNITY): Payer: Self-pay | Admitting: Cardiology

## 2020-12-26 ENCOUNTER — Telehealth (HOSPITAL_COMMUNITY): Payer: Self-pay | Admitting: Pharmacy Technician

## 2020-12-26 VITALS — BP 98/50 | HR 61 | Wt 107.0 lb

## 2020-12-26 DIAGNOSIS — I5022 Chronic systolic (congestive) heart failure: Secondary | ICD-10-CM | POA: Insufficient documentation

## 2020-12-26 DIAGNOSIS — Z09 Encounter for follow-up examination after completed treatment for conditions other than malignant neoplasm: Secondary | ICD-10-CM | POA: Insufficient documentation

## 2020-12-26 DIAGNOSIS — Z7901 Long term (current) use of anticoagulants: Secondary | ICD-10-CM | POA: Insufficient documentation

## 2020-12-26 DIAGNOSIS — R0789 Other chest pain: Secondary | ICD-10-CM | POA: Insufficient documentation

## 2020-12-26 DIAGNOSIS — R0600 Dyspnea, unspecified: Secondary | ICD-10-CM | POA: Insufficient documentation

## 2020-12-26 DIAGNOSIS — I428 Other cardiomyopathies: Secondary | ICD-10-CM | POA: Diagnosis not present

## 2020-12-26 DIAGNOSIS — Z8774 Personal history of (corrected) congenital malformations of heart and circulatory system: Secondary | ICD-10-CM | POA: Diagnosis not present

## 2020-12-26 DIAGNOSIS — I2510 Atherosclerotic heart disease of native coronary artery without angina pectoris: Secondary | ICD-10-CM | POA: Insufficient documentation

## 2020-12-26 DIAGNOSIS — Z7982 Long term (current) use of aspirin: Secondary | ICD-10-CM | POA: Insufficient documentation

## 2020-12-26 DIAGNOSIS — F17210 Nicotine dependence, cigarettes, uncomplicated: Secondary | ICD-10-CM | POA: Insufficient documentation

## 2020-12-26 DIAGNOSIS — Z79899 Other long term (current) drug therapy: Secondary | ICD-10-CM | POA: Insufficient documentation

## 2020-12-26 DIAGNOSIS — J449 Chronic obstructive pulmonary disease, unspecified: Secondary | ICD-10-CM | POA: Diagnosis not present

## 2020-12-26 DIAGNOSIS — Z8616 Personal history of COVID-19: Secondary | ICD-10-CM | POA: Diagnosis not present

## 2020-12-26 DIAGNOSIS — E785 Hyperlipidemia, unspecified: Secondary | ICD-10-CM | POA: Diagnosis not present

## 2020-12-26 DIAGNOSIS — I739 Peripheral vascular disease, unspecified: Secondary | ICD-10-CM | POA: Insufficient documentation

## 2020-12-26 LAB — BASIC METABOLIC PANEL
Anion gap: 7 (ref 5–15)
BUN: 19 mg/dL (ref 8–23)
CO2: 31 mmol/L (ref 22–32)
Calcium: 9.5 mg/dL (ref 8.9–10.3)
Chloride: 102 mmol/L (ref 98–111)
Creatinine, Ser: 1.17 mg/dL — ABNORMAL HIGH (ref 0.44–1.00)
GFR, Estimated: 49 mL/min — ABNORMAL LOW (ref >60)
Glucose, Bld: 106 mg/dL — ABNORMAL HIGH (ref 70–99)
Potassium: 5 mmol/L (ref 3.5–5.1)
Sodium: 140 mmol/L (ref 135–145)

## 2020-12-26 LAB — DIGOXIN LEVEL: Digoxin Level: 0.5 ng/mL — ABNORMAL LOW (ref 0.8–2.0)

## 2020-12-26 LAB — BRAIN NATRIURETIC PEPTIDE: B Natriuretic Peptide: 417.8 pg/mL — ABNORMAL HIGH (ref 0.0–100.0)

## 2020-12-26 MED ORDER — BUPROPION HCL ER (SR) 150 MG PO TB12: ORAL_TABLET | ORAL | 2 refills | Status: DC

## 2020-12-26 NOTE — Progress Notes (Signed)
Medication Samples have been provided to the patient.  Drug name: entresto       Strength: 24/26        Qty: 28  LOT: [id number]  Exp.Date: 10/2022  Dosing instructions: one tab twice a day  The patient has been instructed regarding the correct time, dose, and frequency of taking this medication, including desired effects and most common side effects.   Kerry Dory 10:40 AM 12/26/2020

## 2020-12-26 NOTE — Telephone Encounter (Signed)
Advanced Heart Failure Patient Advocate Encounter  Sent in [place] assistance renewal application.

## 2020-12-26 NOTE — Progress Notes (Signed)
PCP: Dr. Jenny Reichmann Cardiology: Dr. Harrington Challenger HF Cardiology: Dr. Aundra Dubin Cardiology/Vascular: Dr Fletcher Anon  75 y.o.with history of VSD repair as a child, COPD/active smoking, PAD, and chronic systolic CHF.  Patient has a long history of cardiomyopathy.  She had VSD repaired as a child and imaging has not showed residual VSD.  Cardiac MRI in 11/09 showed EF 36%.  Echo in 12/15 showed EF 40-45%.  Echo in 3/18 showed EF down to 15-20% with moderate RV systolic dysfunction.  RHC/LHC in 10/18 showed nonobstructive coronary but was concerning for low output (CI 1.33).  She had a nonhealing right lower extremity wound.  Peripheral angiogram with bilateral CFA occlusions and underwent DCB/self expanding stent to to right CFA.    CPX in 1/19 showed moderate to severe functional limitation, combination of HF and lung disease, probably more related to the lung disease.  PFTs in 12/18 showed severe COPD.   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    7/19 peripheral arterial dopplers showed significant in-stent restenosis right CFA stent.  Echo in 4/21 showed EF 35-40% with mild AI.  CPX in 5/21 showed severe COPD, mild-moderate HF limitation.  11/21 Cardiolite showed prior septal and apical infarction, no ischemia.   Had COVID April 2022. She has continued to recover.   Echo in 9/22 showed EF 35-40% with basal-mid septal akinesis and anterior hypokinesis, no evidence for residual VSD, mildly decreased RV systolic function.   She returns for followup of CHF. She is still smoking but trying to cut back. She had recent excision of right leg skin cancer.  Still has occasional atypical chest pain, no change to pattern (not exertional).  Weight is stable. She rarely takes Lasix.  No lightheadedness. Still with chronic cough.  She has some numbness in right foot but no definite claudication.  Still with dyspnea with heavy exertion. Works at [employer] on the weekends.    [name] device interrogation:  Heartlogic Score 6, no AF  PMH: 1. VSD: s/p repair at [facility] as child.  From description, sounds like muscular VSD.  2. Atrial tachycardia s/p ablation.  3. Hyperlipidemia 4. COPD: Active smoker.  - PFTs (12/18): Severe obstructive lung disease.  5. Atrial fibrillation: Paroxysmal.   Noted only transiently.   6. PAD:  - Angiogram 11/18 showed totally occluded bilateral CFAs.  Patient had stent to right CFA.  ABIs in 12/18 were normal on right. - ABIs (10/20): ABI 0.8 right, 0.74 left - 12/21 dopplers with R CFA stent in-stenosis.  7. Chronic systolic CHF: Nonischemic cardiomyopathy.   - cMRI (11/09): EF 36%, VSD patch in anterior ventricular septum - Echo (12/15): EF 40-45% - Echo (3/18): EF 15-20%, moderate LV dilation, moderate MR, moderate RV dilation with moderately decreased systolic function.  - LHC/RHC (10/18): 3+ MR, EF < 25%, minimal nonobstructive CAD; PA 41/21, LVEDP 18, CI 1.33.  - CPX (1/19): peak VO2 13.4, VE/VCO2 slope 43, RER 1.07.  Mod-severe functional limitation due to combination of HF and lung disease, probably more related to lung disease.  - [name] CRT-D device.  - Echo (7/19): EF 25-30%, mild LV dilation, mild MR, normal RV size and systolic function.  - Echo (4/21): EF 35-40%, mild AI - CPX (5/21): severe COPD, mild-moderate HF limitation.  - Cardiolite (11/21): EF 47%, prior septal and apical infarction, no ischemia. - Echo (9/22): EF 35-40% with basal-mid septal akinesis and anterior hypokinesis, no evidence for residual VSD, mildly decreased RV systolic function.  8. PVCs: Zio patch  9/19 with 6% PVCs.  9. COVID-19 (4/22)  SH: Smoker, lives in [place], married.  FH: Mother with PAD, sister died at birth from congenital heart disease.   ROS: All systems reviewed and negative except as per HPI.   Current Outpatient Medications  Medication Sig Dispense Refill   albuterol (VENTOLIN HFA) 108 (90 Base) MCG/ACT inhaler Inhale 2 puffs into the lungs  every 6 (six) hours as needed for wheezing or shortness of breath.     ALPRAZolam (XANAX) 0.5 MG tablet TAKE 1 TABLET BY MOUTH TWICE PER DAY AS NEEDED 60 tablet 2   aspirin 81 MG tablet Take 1 tablet (81 mg total) by mouth daily. 100 tablet 99   bisoprolol (ZEBETA) 5 MG tablet Take 1 tablet (5 mg total) by mouth daily. 90 tablet 3   Budeson-Glycopyrrol-Formoterol (BREZTRI AEROSPHERE) 160-9-4.8 MCG/ACT AERO Inhale 2 puffs into the lungs 2 (two) times daily. 32.1 g 3   buPROPion (WELLBUTRIN SR) 150 MG 12 hr tablet Take 1 tablet (150 mg total) by mouth daily for 3 days, THEN 1 tablet (150 mg total) daily. 60 tablet 2   chlorpheniramine-HYDROcodone (TUSSIONEX PENNKINETIC ER) 10-8 MG/5ML SUER Take 5 mLs by mouth every 12 (twelve) hours as needed for cough. 180 mL 0   digoxin (LANOXIN) 0.125 MG tablet TAKE 1/2 TABLET BY MOUTH DAILY 45 tablet 3   furosemide (LASIX) 20 MG tablet Take 1 tablet (20 mg total) by mouth daily as needed. 30 tablet 0   ibuprofen (ADVIL,MOTRIN) 200 MG tablet Take 200 mg by mouth daily as needed for headache.     rosuvastatin (CRESTOR) 10 MG tablet TAKE 1 TABLET BY MOUTH DAILY 90 tablet 1   sacubitril-valsartan (ENTRESTO) 24-26 MG Take 1 tablet by mouth 2 (two) times daily. 180 tablet 3   spironolactone (ALDACTONE) 25 MG tablet Take 12.5 mg by mouth at bedtime.     No current facility-administered medications for this encounter.   BP (!) 98/50   Pulse 61   Wt 48.5 kg (107 lb)   SpO2 98%   BMI 19.89 kg/m   Wt Readings from Last 3 Encounters:  12/26/20 48.5 kg (107 lb)  09/26/20 48.5 kg (107 lb)  08/21/20 47.9 kg (105 lb 8 oz)   General: NAD Neck: No JVD, no thyromegaly or thyroid nodule.  Lungs: Distant BS CV: Nondisplaced PMI.  Heart regular S1/S2, no S3/S4, no murmur.  No peripheral edema.  No carotid bruit.  Unable to palpate pedal pulses.  Abdomen: Soft, nontender, no hepatosplenomegaly, no distention.  Skin: Intact without lesions or rashes.  Neurologic: Alert  and oriented x 3.  Psych: Normal affect. Extremities: No clubbing or cyanosis.  HEENT: Normal.   Assessment/Plan: 1.  PAD: She had endovascular intervention to occluded right CFA in 11/18, but subsequent arterial showed in-stent restenosis. She has occluded left CFA also that was not intervened on. Minimal claudication and no wounds/rest pain. She is still smoking. - Medical management in absence significant symptoms.  - Continue statin.  - Discussed smoking cessation.  - Followed by Dr Fletcher Anon, has appointment in February.   2. CAD: Nonobstructive on 10/18 cath. She has atypical chest pain at times with stress.  Symptoms are very brief and do not appear to be consistent with angina.  Cardiolite in 11/21 with no evidence of ischemia.  - She is on ASA 81 and statin.  3. Hyperlipidemia: LDL has been at goal. Continue crestor. 4. COPD: Severe by PFTs on 5/21 CPX. COPD plays a significant  role in her dyspnea. She is still smoking.  - She will is willing to try Wellbutrin, I will prescribe today.  6. Chronic systolic CHF: Nonischemic cardiomyopathy.  RHC/LHC in 10/18 with no nonobstructive coronary disease but CI was very low at 1.33.  Her low cardiac output was out of proportion to her symptoms.  [name] CRT-D device.  CPX in 5/21 showed severe COPD but only mild-moderate HF limitation.   Echo with EF stable at 35-40% in 9/22.  She is not volume overloaded on exam, HeartLogic score 6. NYHA class II.  No BP room to titrate HF meds today.  - Uses Lasix prn (rarely).  - Continue Entresto 24/26 bid.   - Continue bisoprolol 5 mg daily.  - Continue spironolactone 12.5 mg daily. Unable to tolerate higher doses. - Continue digoxin, check level today.  - Unable to tolerate [place].  - BMET and dig level today.   Followup in 3 months.   Loralie Champagne 12/26/2020

## 2020-12-26 NOTE — Patient Instructions (Signed)
START Wellbutrin 150 mg, one tab twice a day for 3 days, then 150 mg one tab daily thereafter   Labs today We will only contact you if something comes back abnormal or we need to make some changes. Otherwise no news is good news!  Your physician recommends that you schedule a follow-up appointment in: 3 months   Do the following things EVERYDAY: Weigh yourself in the morning before breakfast. Write it down and keep it in a log. Take your medicines as prescribed Eat low salt foods--Limit salt (sodium) to 2000 mg per day.  Stay as active as you can everyday Limit all fluids for the day to less than 2 liters

## 2020-12-31 DIAGNOSIS — D485 Neoplasm of uncertain behavior of skin: Secondary | ICD-10-CM | POA: Diagnosis not present

## 2020-12-31 DIAGNOSIS — Z4801 Encounter for change or removal of surgical wound dressing: Secondary | ICD-10-CM | POA: Diagnosis not present

## 2020-12-31 DIAGNOSIS — Z48817 Encounter for surgical aftercare following surgery on the skin and subcutaneous tissue: Secondary | ICD-10-CM | POA: Diagnosis not present

## 2021-01-22 ENCOUNTER — Ambulatory Visit: Payer: Medicare Other | Admitting: Internal Medicine

## 2021-02-01 ENCOUNTER — Telehealth (HOSPITAL_COMMUNITY): Payer: Self-pay | Admitting: Pharmacist

## 2021-02-01 NOTE — Telephone Encounter (Signed)
Advanced Heart Failure Patient Advocate Encounter   Patient was approved to receive Entresto from [employer].   Patient ID: [id number] Effective dates: 01/20/21 through 01/19/22  Audry Riles, PharmD, BCPS, BCCP, CPP Heart Failure Clinic Pharmacist [phone]

## 2021-02-05 ENCOUNTER — Other Ambulatory Visit: Payer: Self-pay | Admitting: Cardiology

## 2021-02-07 ENCOUNTER — Ambulatory Visit: Payer: Medicare Other | Admitting: Internal Medicine

## 2021-02-22 ENCOUNTER — Ambulatory Visit: Payer: Medicare Other | Admitting: Cardiovascular Disease

## 2021-02-22 ENCOUNTER — Other Ambulatory Visit: Payer: Self-pay

## 2021-02-22 ENCOUNTER — Encounter: Payer: Self-pay | Admitting: Cardiovascular Disease

## 2021-02-22 VITALS — BP 100/78 | HR 69 | Ht 61.0 in | Wt 108.1 lb

## 2021-02-22 DIAGNOSIS — I5022 Chronic systolic (congestive) heart failure: Secondary | ICD-10-CM | POA: Diagnosis not present

## 2021-02-22 DIAGNOSIS — I739 Peripheral vascular disease, unspecified: Secondary | ICD-10-CM

## 2021-02-22 DIAGNOSIS — E785 Hyperlipidemia, unspecified: Secondary | ICD-10-CM

## 2021-02-22 DIAGNOSIS — Z720 Tobacco use: Secondary | ICD-10-CM

## 2021-02-22 NOTE — Patient Instructions (Signed)
Medication Instructions:  °Your physician recommends that you continue on your current medications as directed. Please refer to the Current Medication list given to you today. ° °*If you need a refill on your cardiac medications before your next appointment, please call your pharmacy* ° ° °Lab Work: °None ordered °If you have labs (blood work) drawn today and your tests are completely normal, you will receive your results only by: °MyChart Message (if you have MyChart) OR °A paper copy in the mail °If you have any lab test that is abnormal or we need to change your treatment, we will call you to review the results. ° ° °Testing/Procedures: °None ordered ° ° °Follow-Up: °At [facility], you and your health needs are our priority.  As part of our continuing mission to provide you with exceptional heart care, we have created designated Provider Care Teams.  These Care Teams include your primary Cardiologist (physician) and Advanced Practice Providers (APPs -  Physician Assistants and Nurse Practitioners) who all work together to provide you with the care you need, when you need it. ° °We recommend signing up for the patient portal called "MyChart".  Sign up information is provided on this After Visit Summary.  MyChart is used to connect with patients for Virtual Visits (Telemedicine).  Patients are able to view lab/test results, encounter notes, upcoming appointments, etc.  Non-urgent messages can be sent to your provider as well.   °To learn more about what you can do with MyChart, go to [email or web address].   ° °Your next appointment:   °Your physician wants you to follow-up in: 1 year You will receive a reminder letter in the mail two months in advance. If you don't receive a letter, please call our office to schedule the follow-up appointment. ° ° °The format for your next appointment:   °In Person ° °Provider:   °You may see Muhammad Arida, MD or one of the following Advanced Practice Providers on your  designated Care Team:   °Christopher Berge, NP °Ryan Dunn, PA-C °Cadence Furth, PA-C{ ° ° ° °Other Instructions °N/A ° °

## 2021-02-22 NOTE — Progress Notes (Signed)
Cardiology Office Note   Date:  02/22/2021   ID:  Kimberly [name], Kimberly [name] 1945-04-04, MRN [id number]  PCP:  Biagio Borg, MD  Cardiologist: Dr. Harrington Challenger  Chief Complaint  Patient presents with   Other    6 Month f/u no complaints today. Meds reviewed verbally with pt.      History of Present Illness: Kimberly [name] is a 76 y.o. female who is here today for a follow-up visit regarding peripheral arterial disease.   She is followed by me for peripheral arterial disease and followed closely at the heart failure clinic.  She has history of congenital heart disease status post VSD repair with multiple prior catheterizations via bilateral femoral arteries when she was a child, SVT status post ablation, hyperlipidemia, COPD, tobacco use, transient atrial fibrillation and chronic systolic heart failure. Patient was seen in 2018 for peripheral arterial disease with nonhealing wound on the right lateral leg above the ankle. She underwent recent noninvasive vascular evaluation which showed a right ABI of 0.62 and left of 0.72. Angiography in 2018 showed moderate left common iliac artery stenosis, short occlusion of right common femoral artery with no significant infrainguinal disease and short occlusion of the left common femoral artery with no significant infrainguinal disease.  I performed successful drug-coated balloon angioplasty and self-expanding stent placement to the right common femoral artery without complications.  She had no recurrent wounds since then. She developed severe restenosis in the right common femoral artery but remained asymptomatic and thus has been monitored closely.   The patient developed an ulceration on the right thigh.  Biopsy was done and showed squamous cell carcinoma and she ultimately underwent full resection with healing.  She has been doing well with no chest pain or worsening dyspnea.  She reports stable leg claudication and no lower extremity ulcerations.   Past  Medical History:  Diagnosis Date   AICD (automatic cardioverter/defibrillator) present 03/17/2017   Anxiety    Atrial tachycardia (HCC)    Bursitis of shoulder, right, adhesive    Cancer (HCC)    CHF (congestive heart failure) ([place])    Chronic bronchitis ([place])    "1-2 times/yr" (01/23/2014)   COPD (chronic obstructive pulmonary disease) (HCC)    CVD (cerebrovascular disease)    Dyslipidemia    Dysrhythmia    Frequency of urination    GERD (gastroesophageal reflux disease)    Heart murmur    History of stomach ulcers    HTN (hypertension) 02/22/2011   Migraines    "stopped many years ago" (06/14/2014)   Osteoporosis 08/19/2016   Pericarditis    Pneumonia "10 times" (06/14/2014)   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)    Silent myocardial infarction [facility]) "late 1990's"   Stress incontinence    "was suppose to have been tacked up years ago but I didn't do it"   Syncope, near    Associated with atrial tachycardia-event recorder 1/16   Thoracic outlet syndrome    VSD (ventricular septal defect)     Past Surgical History:  Procedure Laterality Date   ABDOMINAL AORTOGRAM W/LOWER EXTREMITY N/A 12/17/2016   Procedure: ABDOMINAL AORTOGRAM W/LOWER EXTREMITY;  Surgeon: Wellington Hampshire, MD;  Location: [place] [facility];  Service: Cardiovascular;  Laterality: N/A;   BIV ICD INSERTION CRT-D N/A 03/17/2017   Procedure: BIV ICD INSERTION CRT-D;  Surgeon: Evans Lance, MD;  Location: [place] [facility];  Service: Cardiovascular;  Laterality: N/A;   CARDIAC CATHETERIZATION  "quite  a few"   [place]  1970's   CORONARY ANGIOGRAM  2000   No significant CAD   ELECTROPHYSIOLOGIC STUDY N/A 06/14/2014   Procedure: A-Flutter/A-Tach/SVT Ablation;  Surgeon: Evans Lance, MD;  Location: [place] [facility];  Service: Cardiovascular;  Laterality: N/A;   INSERTION OF ICD  03/17/2017   BIV   MYRINGOTOMY WITH TUBE PLACEMENT  Right 2015   PERIPHERAL VASCULAR INTERVENTION  12/17/2016   Procedure: PERIPHERAL VASCULAR INTERVENTION;  Surgeon: Wellington Hampshire, MD;  Location: [place] [facility];  Service: Cardiovascular;;  Right common femoral PTA and Stent   RIGHT/LEFT HEART CATH AND CORONARY ANGIOGRAPHY Bilateral 11/06/2016   Procedure: RIGHT/LEFT HEART CATH AND CORONARY ANGIOGRAPHY;  Surgeon: Wellington Hampshire, MD;  Location: [place] [facility];  Service: Cardiovascular;  Laterality: Bilateral;   SVT ABLATION  06/14/2014   TUBAL LIGATION  1972   VSD REPAIR  1958; 1967     Current Outpatient Medications  Medication Sig Dispense Refill   albuterol (VENTOLIN HFA) 108 (90 Base) MCG/ACT inhaler Inhale 2 puffs into the lungs every 6 (six) hours as needed for wheezing or shortness of breath.     ALPRAZolam (XANAX) 0.5 MG tablet TAKE 1 TABLET BY MOUTH TWICE PER DAY AS NEEDED 60 tablet 2   aspirin 81 MG tablet Take 1 tablet (81 mg total) by mouth daily. 100 tablet 99   bisoprolol (ZEBETA) 5 MG tablet Take 1 tablet (5 mg total) by mouth daily. 90 tablet 3   Budeson-Glycopyrrol-Formoterol (BREZTRI AEROSPHERE) 160-9-4.8 MCG/ACT AERO Inhale 2 puffs into the lungs 2 (two) times daily. 32.1 g 3   chlorpheniramine-HYDROcodone (TUSSIONEX PENNKINETIC ER) 10-8 MG/5ML SUER Take 5 mLs by mouth every 12 (twelve) hours as needed for cough. 180 mL 0   digoxin (LANOXIN) 0.125 MG tablet TAKE 1/2 TABLET BY MOUTH DAILY 45 tablet 3   furosemide (LASIX) 20 MG tablet Take 1 tablet (20 mg total) by mouth daily as needed. 30 tablet 0   ibuprofen (ADVIL,MOTRIN) 200 MG tablet Take 200 mg by mouth daily as needed for headache.     sacubitril-valsartan (ENTRESTO) 24-26 MG Take 1 tablet by mouth 2 (two) times daily. 180 tablet 3   spironolactone (ALDACTONE) 25 MG tablet Take 12.5 mg by mouth at bedtime.     buPROPion (WELLBUTRIN SR) 150 MG 12 hr tablet Take 1 tablet (150 mg total) by mouth daily for 3 days, THEN 1 tablet (150 mg total) daily. (Patient  not taking: Reported on 02/22/2021) 60 tablet 2   rosuvastatin (CRESTOR) 10 MG tablet TAKE 1 TABLET BY MOUTH DAILY (Patient not taking: Reported on 02/22/2021) 90 tablet 1   No current facility-administered medications for this visit.    Allergies:   Mupirocin and Codeine    Social History:  The patient  reports that she has been smoking cigarettes. She has a 11.55 pack-year smoking history. She has never used smokeless tobacco. She reports that she does not currently use alcohol after a past usage of about 1.0 standard drink per week. She reports that she does not use drugs.   Family History:  The patient's family history includes Alcohol abuse in an other family member; Arthritis in some other family members; Cancer in her father, mother, and another family member; Hypertension in some other family members; Stroke in some other family members; Throat cancer in an other family member.    ROS:  Please see the history of present illness.  Otherwise, review of systems are positive for none.   All other systems are reviewed and negative.    PHYSICAL EXAM: VS:  Ht 5\' 1"  (1.549 m)    Wt 108 lb 2 oz (49 kg)    BMI 20.43 kg/m  , BMI Body mass index is 20.43 kg/m. GEN: Well nourished, well developed, in no acute distress  HEENT: normal  Neck: no JVD, carotid bruits, or masses Cardiac: RRR; no  rubs, or gallops,no edema. 2/f 6 holosystolic murmur at the left sternal border and apex. Respiratory:  clear to auscultation bilaterally, normal work of breathing GI: soft, nontender, nondistended, + BS MS: no deformity or atrophy  Skin: warm and dry, no rash Neuro:  Strength and sensation are intact Psych: euthymic mood, full affect Vascular: Radial pulse is absent bilaterally.    EKG:  EKG is ordered today. The ekg ordered today demonstrates AV dual-chamber biventricular pacemaker.   Recent Labs: 06/11/2020: Hemoglobin 13.0; Platelets 343.0; TSH 1.09 09/26/2020: ALT 13 12/26/2020: B Natriuretic  Peptide 417.8; BUN 19; Creatinine, Ser 1.17; Potassium 5.0; Sodium 140    Lipid Panel    Component Value Date/Time   CHOL 150 06/11/2020 1551   TRIG 57.0 06/11/2020 1551   HDL 73.70 06/11/2020 1551   CHOLHDL 2 06/11/2020 1551   VLDL 11.4 06/11/2020 1551   LDLCALC 65 06/11/2020 1551   LDLDIRECT 112.6 03/16/2013 1057      Wt Readings from Last 3 Encounters:  02/22/21 108 lb 2 oz (49 kg)  12/26/20 107 lb (48.5 kg)  09/26/20 107 lb (48.5 kg)       No flowsheet data found.    ASSESSMENT AND PLAN:  1.  Peripheral arterial disease .  Status post endovascular revascularization of the right common femoral artery with worsening proximal restenosis.  In spite of that, she continues to have minimal claudication. Continue medical therapy.    2.  Chronic systolic heart failure: Due to nonischemic cardiomyopathy.  She appears to be euvolemic and currently on optimal medical therapy followed at the heart failure clinic.     3.  Hyperlipidemia: Continue treatment with rosuvastatin with a target LDL of less than 70.  Most recent lipid profile showed an LDL of 65.   4.  Tobacco use: I discussed the importance of smoking cessation.    Disposition:   FU with me in 12 months  Signed,  Kathlyn Sacramento, MD  02/22/2021 10:57 AM    [place]

## 2021-03-07 ENCOUNTER — Other Ambulatory Visit: Payer: Self-pay

## 2021-03-07 ENCOUNTER — Other Ambulatory Visit: Payer: Self-pay | Admitting: Cardiovascular Disease

## 2021-03-07 ENCOUNTER — Encounter: Payer: Self-pay | Admitting: Cardiovascular Disease

## 2021-03-13 DIAGNOSIS — Z08 Encounter for follow-up examination after completed treatment for malignant neoplasm: Secondary | ICD-10-CM | POA: Diagnosis not present

## 2021-03-13 DIAGNOSIS — L814 Other melanin hyperpigmentation: Secondary | ICD-10-CM | POA: Diagnosis not present

## 2021-03-13 DIAGNOSIS — R229 Localized swelling, mass and lump, unspecified: Secondary | ICD-10-CM | POA: Diagnosis not present

## 2021-03-13 DIAGNOSIS — D485 Neoplasm of uncertain behavior of skin: Secondary | ICD-10-CM | POA: Diagnosis not present

## 2021-03-13 DIAGNOSIS — D229 Melanocytic nevi, unspecified: Secondary | ICD-10-CM | POA: Diagnosis not present

## 2021-03-13 DIAGNOSIS — C44722 Squamous cell carcinoma of skin of right lower limb, including hip: Secondary | ICD-10-CM | POA: Diagnosis not present

## 2021-03-13 DIAGNOSIS — C44729 Squamous cell carcinoma of skin of left lower limb, including hip: Secondary | ICD-10-CM | POA: Diagnosis not present

## 2021-03-13 DIAGNOSIS — L821 Other seborrheic keratosis: Secondary | ICD-10-CM | POA: Diagnosis not present

## 2021-03-13 DIAGNOSIS — Z85828 Personal history of other malignant neoplasm of skin: Secondary | ICD-10-CM | POA: Diagnosis not present

## 2021-03-13 DIAGNOSIS — L570 Actinic keratosis: Secondary | ICD-10-CM | POA: Diagnosis not present

## 2021-03-13 DIAGNOSIS — L578 Other skin changes due to chronic exposure to nonionizing radiation: Secondary | ICD-10-CM | POA: Diagnosis not present

## 2021-03-18 ENCOUNTER — Other Ambulatory Visit: Payer: Self-pay | Admitting: Internal Medicine

## 2021-03-18 ENCOUNTER — Ambulatory Visit (INDEPENDENT_AMBULATORY_CARE_PROVIDER_SITE_OTHER): Payer: Medicare Other

## 2021-03-18 DIAGNOSIS — I5022 Chronic systolic (congestive) heart failure: Secondary | ICD-10-CM | POA: Diagnosis not present

## 2021-03-18 DIAGNOSIS — I420 Dilated cardiomyopathy: Secondary | ICD-10-CM

## 2021-03-18 LAB — CUP PACEART REMOTE DEVICE CHECK
Battery Remaining Longevity: 84 mo
Battery Remaining Percentage: 98 %
Brady Statistic RA Percent Paced: 52 %
Brady Statistic RV Percent Paced: 92 %
Date Time Interrogation Session: 20230227041100
HighPow Impedance: 74 Ohm
Implantable Lead Implant Date: 20190226
Implantable Lead Implant Date: 20190226
Implantable Lead Implant Date: 20190226
Implantable Lead Location: 753858
Implantable Lead Location: 753859
Implantable Lead Location: 753860
Implantable Lead Model: 292
Implantable Lead Model: 4671
Implantable Lead Model: 7740
Implantable Lead Serial Number: 444536
Implantable Lead Serial Number: 726363
Implantable Lead Serial Number: 805938
Implantable Pulse Generator Implant Date: 20190226
Lead Channel Impedance Value: 449 Ohm
Lead Channel Impedance Value: 551 Ohm
Lead Channel Impedance Value: 876 Ohm
Lead Channel Setting Pacing Amplitude: 2 V
Lead Channel Setting Pacing Amplitude: 2.5 V
Lead Channel Setting Pacing Amplitude: 2.6 V
Lead Channel Setting Pacing Pulse Width: 0.4 ms
Lead Channel Setting Pacing Pulse Width: 0.4 ms
Lead Channel Setting Sensing Sensitivity: 0.5 mV
Lead Channel Setting Sensing Sensitivity: 1 mV
Pulse Gen Serial Number: 204176

## 2021-03-18 NOTE — Telephone Encounter (Signed)
Please refill as per office routine med refill policy (all routine meds to be refilled for 3 mo or monthly (per pt preference) up to one year from last visit, then month to month grace period for 3 mo, then further med refills will have to be denied) ? ?

## 2021-03-21 NOTE — Progress Notes (Signed)
Remote ICD transmission.   

## 2021-03-26 ENCOUNTER — Telehealth: Payer: Self-pay | Admitting: Internal Medicine

## 2021-03-26 NOTE — Telephone Encounter (Signed)
1.Medication Requested: chlorpheniramine-HYDROcodone Amanda Cockayne PENNKINETIC ER) 10-8 MG/5ML SUER ? ?2. Pharmacy (Name, [place]): [place], [place] ? ?3. On Med List: y ? ?4. Last Visit with PCP: 06-11-2020 ? ?5. Next visit date with PCP: 04-16-2021 ? ? ?Agent: Please be advised that RX refills may take up to 3 business days. We ask that you follow-up with your pharmacy.  ?

## 2021-03-27 NOTE — Telephone Encounter (Signed)
Spoke with patient and she states that she does not need prescription at the moment. Can wait until she comes in for OV 04/16/21 ?

## 2021-03-28 ENCOUNTER — Ambulatory Visit (HOSPITAL_COMMUNITY)
Admission: RE | Admit: 2021-03-28 | Discharge: 2021-03-28 | Disposition: A | Discharge status: Home / Self Care | Payer: Medicare Other | Source: Ambulatory Visit | Attending: Cardiology | Admitting: Cardiology

## 2021-03-28 ENCOUNTER — Encounter (HOSPITAL_COMMUNITY): Payer: Self-pay | Admitting: Cardiology

## 2021-03-28 ENCOUNTER — Other Ambulatory Visit: Payer: Self-pay

## 2021-03-28 VITALS — BP 100/58 | HR 64 | Wt 111.2 lb

## 2021-03-28 DIAGNOSIS — I2510 Atherosclerotic heart disease of native coronary artery without angina pectoris: Secondary | ICD-10-CM | POA: Insufficient documentation

## 2021-03-28 DIAGNOSIS — J449 Chronic obstructive pulmonary disease, unspecified: Secondary | ICD-10-CM | POA: Diagnosis not present

## 2021-03-28 DIAGNOSIS — Z79899 Other long term (current) drug therapy: Secondary | ICD-10-CM | POA: Diagnosis not present

## 2021-03-28 DIAGNOSIS — Z7982 Long term (current) use of aspirin: Secondary | ICD-10-CM | POA: Diagnosis not present

## 2021-03-28 DIAGNOSIS — I739 Peripheral vascular disease, unspecified: Secondary | ICD-10-CM

## 2021-03-28 DIAGNOSIS — I428 Other cardiomyopathies: Secondary | ICD-10-CM | POA: Diagnosis not present

## 2021-03-28 DIAGNOSIS — E785 Hyperlipidemia, unspecified: Secondary | ICD-10-CM | POA: Insufficient documentation

## 2021-03-28 DIAGNOSIS — F17200 Nicotine dependence, unspecified, uncomplicated: Secondary | ICD-10-CM | POA: Insufficient documentation

## 2021-03-28 DIAGNOSIS — I509 Heart failure, unspecified: Secondary | ICD-10-CM | POA: Diagnosis not present

## 2021-03-28 DIAGNOSIS — I5022 Chronic systolic (congestive) heart failure: Secondary | ICD-10-CM

## 2021-03-28 LAB — BASIC METABOLIC PANEL
Anion gap: 7 (ref 5–15)
BUN: 16 mg/dL (ref 8–23)
CO2: 28 mmol/L (ref 22–32)
Calcium: 9 mg/dL (ref 8.9–10.3)
Chloride: 105 mmol/L (ref 98–111)
Creatinine, Ser: 1.26 mg/dL — ABNORMAL HIGH (ref 0.44–1.00)
GFR, Estimated: 45 mL/min — ABNORMAL LOW (ref >60)
Glucose, Bld: 85 mg/dL (ref 70–99)
Potassium: 4.6 mmol/L (ref 3.5–5.1)
Sodium: 140 mmol/L (ref 135–145)

## 2021-03-28 LAB — LIPID PANEL
Cholesterol: 152 mg/dL (ref 0–200)
HDL: 86 mg/dL (ref >40)
LDL Cholesterol: 58 mg/dL (ref 0–99)
Total CHOL/HDL Ratio: 1.8 RATIO
Triglycerides: 38 mg/dL (ref <150)
VLDL: 8 mg/dL (ref 0–40)

## 2021-03-28 LAB — DIGOXIN LEVEL: Digoxin Level: 0.6 ng/mL — ABNORMAL LOW (ref 0.8–2.0)

## 2021-03-28 MED ORDER — FUROSEMIDE 20 MG PO TABS: 20.0000 mg | ORAL_TABLET | ORAL | 0 refills | Status: DC

## 2021-03-28 NOTE — Patient Instructions (Signed)
Good to see you today ? ?Take Lasix 20 mg every other day  ? ?EKG ? ?Labs done today, your results will be available in MyChart, we will contact you for abnormal readings. ? ?Follow up lab work in 10 days in [place] ? ? ?Your physician recommends that you schedule a follow-up appointment in: 2 months with app clinic ? ?If you have any questions or concerns before your next appointment please send [place] a message through [place] or call our office at [phone].   ? ?TO LEAVE A MESSAGE FOR THE NURSE SELECT OPTION 2, PLEASE LEAVE A MESSAGE INCLUDING: ?YOUR NAME ?DATE OF BIRTH ?CALL BACK NUMBER ?REASON FOR CALL**this is important as we prioritize the call backs ? ?YOU WILL RECEIVE A CALL BACK THE SAME DAY AS LONG AS YOU CALL BEFORE 4:00 PM ? ?At the [facility], you and your health needs are our priority. As part of our continuing mission to provide you with exceptional heart care, we have created designated Provider Care Teams. These Care Teams include your primary Cardiologist (physician) and Advanced Practice Providers (APPs- Physician Assistants and Nurse Practitioners) who all work together to provide you with the care you need, when you need it.  ? ?You may see any of the following providers on your designated Care Team at your next follow up: ?Dr Glori Bickers ?Dr Loralie Champagne ?Darrick Grinder, NP ?Lyda Jester, PA ?Jessica Milford,NP ?Marlyce Huge, PA ?Audry Riles, PharmD ? ? ?Please be sure to bring in all your medications bottles to every appointment.  ? ?Do the following things EVERYDAY: ?Weigh yourself in the morning before breakfast. Write it down and keep it in a log. ?Take your medicines as prescribed ?Eat low salt foods--Limit salt (sodium) to 2000 mg per day.  ?Stay as active as you can everyday ?Limit all fluids for the day to less than 2 liters ? ? ?

## 2021-03-28 NOTE — Progress Notes (Signed)
?PCP: Dr. Jenny Reichmann ?Cardiology: Dr. Harrington Challenger ?HF Cardiology: Dr. Aundra Dubin ?Cardiology/Vascular: Dr Fletcher Anon ? ?76 y.o.with history of VSD repair as a child, COPD/active smoking, PAD, and chronic systolic CHF. ? ?Patient has a long history of cardiomyopathy.  She had VSD repaired as a child and imaging has not showed residual VSD.  Cardiac MRI in 11/09 showed EF 36%.  Echo in 12/15 showed EF 40-45%.  Echo in 3/18 showed EF down to 15-20% with moderate RV systolic dysfunction.  RHC/LHC in 10/18 showed nonobstructive coronary but was concerning for low output (CI 1.33).  She had a nonhealing right lower extremity wound.  Peripheral angiogram with bilateral CFA occlusions and underwent DCB/self expanding stent to to right CFA.   ? ?CPX in 1/19 showed moderate to severe functional limitation, combination of HF and lung disease, probably more related to the lung disease.  PFTs in 12/18 showed severe COPD.  ? 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   ? ?7/19 peripheral arterial dopplers showed significant in-stent restenosis right CFA stent. ? ?Echo in 4/21 showed EF 35-40% with mild AI.  CPX in 5/21 showed severe COPD, mild-moderate HF limitation.  11/21 Cardiolite showed prior septal and apical infarction, no ischemia.  ? ?Had COVID April 2022. She has continued to recover.  ? ?Echo in 9/22 showed EF 35-40% with basal-mid septal akinesis and anterior hypokinesis, no evidence for residual VSD, mildly decreased RV systolic function.  ? ?She returns for followup of CHF. She is still smoking.  She has not used any Lasix recently. Weight up about 4 lbs.  She has been under stress recently, she will get some chest tightness with stress, no exertional chest pain. Stable dyspnea with heavy exertion.  No orthopnea/PND.     ? ?Development worker, international aid: Heartlogic Score 8, no AF ? ?Labs (12/22): BNP 418, digoxin 0.5, K 5, creatinine 1.17 ? ?PMH: ?1. VSD: s/p repair at [facility] as child.  From description, sounds like  muscular VSD.  ?2. Atrial tachycardia s/p ablation.  ?3. Hyperlipidemia ?4. COPD: Active smoker.  ?- PFTs (12/18): Severe obstructive lung disease.  ?5. Atrial fibrillation: Paroxysmal.   Noted only transiently.   ?6. PAD:  ?- Angiogram 11/18 showed totally occluded bilateral CFAs.  Patient had stent to right CFA.  ABIs in 12/18 were normal on right. ?- ABIs (10/20): ABI 0.8 right, 0.74 left ?- 12/21 dopplers with R CFA stent in-stenosis.  ?7. Chronic systolic CHF: Nonischemic cardiomyopathy.   ?- cMRI (11/09): EF 36%, VSD patch in anterior ventricular septum ?- Echo (12/15): EF 40-45% ?- Echo (3/18): EF 15-20%, moderate LV dilation, moderate MR, moderate RV dilation with moderately decreased systolic function.  ?- LHC/RHC (10/18): 3+ MR, EF < 25%, minimal nonobstructive CAD; PA 41/21, LVEDP 18, CI 1.33.  ?- CPX (1/19): peak VO2 13.4, VE/VCO2 slope 43, RER 1.07.  Mod-severe functional limitation due to combination of HF and lung disease, probably more related to lung disease.  ?- [employer] CRT-D device.  ?- Echo (7/19): EF 25-30%, mild LV dilation, mild MR, normal RV size and systolic function.  ?- Echo (4/21): EF 35-40%, mild AI ?- CPX (5/21): severe COPD, mild-moderate HF limitation.  ?- Cardiolite (11/21): EF 47%, prior septal and apical infarction, no ischemia. ?- Echo (9/22): EF 35-40% with basal-mid septal akinesis and anterior hypokinesis, no evidence for residual VSD, mildly decreased RV systolic function.  ?8. PVCs: Zio patch 9/19 with 6% PVCs.  ?9. COVID-19 (4/22) ? ?SH: Smoker, lives in [place], married. ? ?  FH: Mother with PAD, sister died at birth from congenital heart disease.  ? ?ROS: All systems reviewed and negative except as per HPI.  ? ?Current Outpatient Medications  ?Medication Sig Dispense Refill  ? albuterol (VENTOLIN HFA) 108 (90 Base) MCG/ACT inhaler INHALE 2 PUFFS INTO THE LUNGS EVERY 6 HOURS AS NEEDED FOR WHEEZING OR SHORTNESS OF BREATH 8.5 g 0  ? ALPRAZolam (XANAX) 0.5 MG tablet  TAKE 1 TABLET BY MOUTH TWICE PER DAY AS NEEDED 60 tablet 2  ? aspirin 81 MG tablet Take 1 tablet (81 mg total) by mouth daily. 100 tablet 99  ? bisoprolol (ZEBETA) 5 MG tablet Take 1 tablet (5 mg total) by mouth daily. 90 tablet 3  ? Budeson-Glycopyrrol-Formoterol (BREZTRI AEROSPHERE) 160-9-4.8 MCG/ACT AERO Inhale 2 puffs into the lungs 2 (two) times daily. 32.1 g 3  ? chlorpheniramine-HYDROcodone (TUSSIONEX PENNKINETIC ER) 10-8 MG/5ML SUER Take 5 mLs by mouth every 12 (twelve) hours as needed for cough. 180 mL 0  ? digoxin (LANOXIN) 0.125 MG tablet TAKE 1/2 TABLET BY MOUTH DAILY 45 tablet 3  ? ibuprofen (ADVIL,MOTRIN) 200 MG tablet Take 200 mg by mouth daily as needed for headache.    ? rosuvastatin (CRESTOR) 10 MG tablet TAKE 1 TABLET BY MOUTH DAILY 90 tablet 3  ? sacubitril-valsartan (ENTRESTO) 24-26 MG Take 1 tablet by mouth 2 (two) times daily. 180 tablet 3  ? spironolactone (ALDACTONE) 25 MG tablet Take 12.5 mg by mouth at bedtime.    ? furosemide (LASIX) 20 MG tablet Take 1 tablet (20 mg total) by mouth every other day. 30 tablet 0  ? ?No current facility-administered medications for this encounter.  ? ?BP (!) 100/58   Pulse 64   Wt 50.4 kg (111 lb 3.2 oz)   SpO2 95%   BMI 21.01 kg/m?   ?Wt Readings from Last 3 Encounters:  ?03/28/21 50.4 kg (111 lb 3.2 oz)  ?02/22/21 49 kg (108 lb 2 oz)  ?12/26/20 48.5 kg (107 lb)  ? ?General: NAD ?Neck: JVP 8 cm with HJR, no thyromegaly or thyroid nodule.  ?Lungs: Clear to auscultation bilaterally with normal respiratory effort. ?CV: Nondisplaced PMI.  Heart regular S1/S2, no S3/S4, 2/6 HSM LLSB.  No peripheral edema.  No carotid bruit.  Unable to palpate pedal pulses.  ?Abdomen: Soft, nontender, no hepatosplenomegaly, no distention.  ?Skin: Intact without lesions or rashes.  ?Neurologic: Alert and oriented x 3.  ?Psych: Normal affect. ?Extremities: No clubbing or cyanosis.  ?HEENT: Normal.  ? ?Assessment/Plan: ?1.  PAD: She had endovascular intervention to occluded  right CFA in 11/18, but subsequent arterial showed in-stent restenosis. She has occluded left CFA also that was not intervened on. Minimal claudication and no wounds/rest pain. She is still smoking. ?- Medical management in absence significant symptoms.  ?- Continue statin.  ?- Discussed smoking cessation.  ?- Followed by Dr Fletcher Anon.   ?2. CAD: Nonobstructive on 10/18 cath. She has atypical chest pain at times with stress.  Symptoms are very brief and do not appear to be consistent with angina.  Cardiolite in 11/21 with no evidence of ischemia.  ?- She is on ASA 81 and statin.  ?3. Hyperlipidemia: Continue crestor, check lipids today. ?4. COPD: Severe by PFTs on 5/21 CPX. COPD plays a significant role in her dyspnea. She is still smoking.  ?5. Chronic systolic CHF: Nonischemic cardiomyopathy.  RHC/LHC in 10/18 with no nonobstructive coronary disease but CI was very low at 1.33.  Her low cardiac output was out of proportion  to her symptoms.  [name] CRT-D device.  CPX in 5/21 showed severe COPD but only mild-moderate HF limitation.   Echo with EF stable at 35-40% in 9/22.  Weight up, probably mild volume overload on exam.    ?- Take Lasix 20 mg every other day. BMET today and in 10 days.  ?- Continue Entresto 24/26 bid, no BP room to increase.   ?- Continue bisoprolol 5 mg daily, no BP room to increase.  ?- Continue spironolactone 12.5 mg daily. Unable to tolerate higher doses. ?- Continue digoxin, check level today.  ?- Unable to tolerate [place].  ? ?Followup with APP in 2 months.  ? ?Kimberly [name] ?03/28/2021 ? ? ?

## 2021-04-01 ENCOUNTER — Other Ambulatory Visit: Payer: Self-pay | Admitting: Internal Medicine

## 2021-04-11 ENCOUNTER — Other Ambulatory Visit
Admission: RE | Admit: 2021-04-11 | Discharge: 2021-04-11 | Disposition: A | Discharge status: Home / Self Care | Payer: Medicare Other | Source: Ambulatory Visit | Attending: Cardiology | Admitting: Cardiology

## 2021-04-11 DIAGNOSIS — I509 Heart failure, unspecified: Secondary | ICD-10-CM | POA: Insufficient documentation

## 2021-04-11 LAB — BASIC METABOLIC PANEL
Anion gap: 8 (ref 5–15)
BUN: 24 mg/dL — ABNORMAL HIGH (ref 8–23)
CO2: 29 mmol/L (ref 22–32)
Calcium: 9.4 mg/dL (ref 8.9–10.3)
Chloride: 99 mmol/L (ref 98–111)
Creatinine, Ser: 1.12 mg/dL — ABNORMAL HIGH (ref 0.44–1.00)
GFR, Estimated: 52 mL/min — ABNORMAL LOW (ref >60)
Glucose, Bld: 136 mg/dL — ABNORMAL HIGH (ref 70–99)
Potassium: 4.2 mmol/L (ref 3.5–5.1)
Sodium: 136 mmol/L (ref 135–145)

## 2021-04-16 ENCOUNTER — Encounter: Payer: Medicare Other | Admitting: Student

## 2021-04-16 ENCOUNTER — Ambulatory Visit (INDEPENDENT_AMBULATORY_CARE_PROVIDER_SITE_OTHER): Payer: Medicare Other | Admitting: Internal Medicine

## 2021-04-16 ENCOUNTER — Encounter: Payer: Self-pay | Admitting: Internal Medicine

## 2021-04-16 VITALS — BP 88/54 | HR 60 | Temp 99.1°F | Ht 61.0 in | Wt 108.6 lb

## 2021-04-16 DIAGNOSIS — E538 Deficiency of other specified B group vitamins: Secondary | ICD-10-CM

## 2021-04-16 DIAGNOSIS — Z0001 Encounter for general adult medical examination with abnormal findings: Secondary | ICD-10-CM

## 2021-04-16 DIAGNOSIS — I10 Essential (primary) hypertension: Secondary | ICD-10-CM

## 2021-04-16 DIAGNOSIS — E785 Hyperlipidemia, unspecified: Secondary | ICD-10-CM

## 2021-04-16 DIAGNOSIS — F419 Anxiety disorder, unspecified: Secondary | ICD-10-CM

## 2021-04-16 DIAGNOSIS — R739 Hyperglycemia, unspecified: Secondary | ICD-10-CM

## 2021-04-16 DIAGNOSIS — F17210 Nicotine dependence, cigarettes, uncomplicated: Secondary | ICD-10-CM | POA: Diagnosis not present

## 2021-04-16 DIAGNOSIS — R350 Frequency of micturition: Secondary | ICD-10-CM | POA: Diagnosis not present

## 2021-04-16 DIAGNOSIS — E559 Vitamin D deficiency, unspecified: Secondary | ICD-10-CM

## 2021-04-16 LAB — CBC WITH DIFFERENTIAL/PLATELET
Basophils Absolute: 0.1 10*3/uL (ref 0.0–0.1)
Basophils Relative: 0.8 % (ref 0.0–3.0)
Eosinophils Absolute: 0.1 10*3/uL (ref 0.0–0.7)
Eosinophils Relative: 0.9 % (ref 0.0–5.0)
HCT: 40.4 % (ref 36.0–46.0)
Hemoglobin: 13.5 g/dL (ref 12.0–15.0)
Lymphocytes Relative: 27.1 % (ref 12.0–46.0)
Lymphs Abs: 1.8 10*3/uL (ref 0.7–4.0)
MCHC: 33.3 g/dL (ref 30.0–36.0)
MCV: 90.8 fl (ref 78.0–100.0)
Monocytes Absolute: 0.5 10*3/uL (ref 0.1–1.0)
Monocytes Relative: 8.3 % (ref 3.0–12.0)
Neutro Abs: 4.2 10*3/uL (ref 1.4–7.7)
Neutrophils Relative %: 62.9 % (ref 43.0–77.0)
Platelets: 223 10*3/uL (ref 150.0–400.0)
RBC: 4.46 Mil/uL (ref 3.87–5.11)
RDW: 14.8 % (ref 11.5–15.5)
WBC: 6.6 10*3/uL (ref 4.0–10.5)

## 2021-04-16 LAB — HEPATIC FUNCTION PANEL
ALT: 14 U/L (ref 0–35)
AST: 17 U/L (ref 0–37)
Albumin: 4.5 g/dL (ref 3.5–5.2)
Alkaline Phosphatase: 66 U/L (ref 39–117)
Bilirubin, Direct: 0.2 mg/dL (ref 0.0–0.3)
Total Bilirubin: 0.9 mg/dL (ref 0.2–1.2)
Total Protein: 7.4 g/dL (ref 6.0–8.3)

## 2021-04-16 LAB — URINALYSIS, ROUTINE W REFLEX MICROSCOPIC
Bilirubin Urine: NEGATIVE
Ketones, ur: NEGATIVE
Leukocytes,Ua: NEGATIVE
Nitrite: NEGATIVE
Specific Gravity, Urine: 1.01 (ref 1.000–1.030)
Total Protein, Urine: NEGATIVE
Urine Glucose: NEGATIVE
Urobilinogen, UA: 0.2 (ref 0.0–1.0)
WBC, UA: NONE SEEN (ref 0–?)
pH: 6 (ref 5.0–8.0)

## 2021-04-16 LAB — LIPID PANEL
Cholesterol: 166 mg/dL (ref 0–200)
HDL: 89.5 mg/dL (ref >39.00)
LDL Cholesterol: 62 mg/dL (ref 0–99)
NonHDL: 76.51
Total CHOL/HDL Ratio: 2
Triglycerides: 72 mg/dL (ref 0.0–149.0)
VLDL: 14.4 mg/dL (ref 0.0–40.0)

## 2021-04-16 LAB — BASIC METABOLIC PANEL
BUN: 27 mg/dL — ABNORMAL HIGH (ref 6–23)
CO2: 30 mEq/L (ref 19–32)
Calcium: 9.7 mg/dL (ref 8.4–10.5)
Chloride: 101 mEq/L (ref 96–112)
Creatinine, Ser: 1.18 mg/dL (ref 0.40–1.20)
GFR: 45.62 mL/min — ABNORMAL LOW (ref >60.00)
Glucose, Bld: 102 mg/dL — ABNORMAL HIGH (ref 70–99)
Potassium: 4.6 mEq/L (ref 3.5–5.1)
Sodium: 138 mEq/L (ref 135–145)

## 2021-04-16 LAB — VITAMIN B12: Vitamin B-12: 228 pg/mL (ref 211–911)

## 2021-04-16 LAB — HEMOGLOBIN A1C: Hgb A1c MFr Bld: 6.3 % (ref 4.6–6.5)

## 2021-04-16 LAB — VITAMIN D 25 HYDROXY (VIT D DEFICIENCY, FRACTURES): VITD: 22.71 ng/mL — ABNORMAL LOW (ref 30.00–100.00)

## 2021-04-16 LAB — TSH: TSH: 0.87 u[IU]/mL (ref 0.35–5.50)

## 2021-04-16 MED ORDER — HYDROCOD POLI-CHLORPHE POLI ER 10-8 MG/5ML PO SUER: 5.0000 mL | Freq: Two times a day (BID) | ORAL | 0 refills | Status: DC | PRN

## 2021-04-16 NOTE — Assessment & Plan Note (Signed)
Lab Results  ?Component Value Date  ? Kimberly [name] 64 04/16/2021  ? ?Stable, pt to continue current statin crestor 10 ? ?

## 2021-04-16 NOTE — Assessment & Plan Note (Signed)
Lab Results  ?Component Value Date  ? HGBA1C 6.3 04/16/2021  ? ?Stable, pt to continue current medical treatment   - diet ? ?

## 2021-04-16 NOTE — Assessment & Plan Note (Signed)
Pt still smoking, counsled to quit, pt not ready ?

## 2021-04-16 NOTE — Assessment & Plan Note (Signed)
Last vitamin D ?Lab Results  ?Component Value Date  ? VD25OH 22.71 (L) 04/16/2021  ? low, to start oral replacement ? ?

## 2021-04-16 NOTE — Assessment & Plan Note (Signed)
Etiology unclear, for ua and culture r/o infectious ?

## 2021-04-16 NOTE — Assessment & Plan Note (Signed)
Age and sex appropriate education and counseling updated with regular exercise and diet ?Referrals for preventative services - declines colonoscopy/cologuard, mammogram ?Immunizations addressed - declines shingrix, tdap, covid booster ?Smoking counseling  - none needed ?Evidence for depression or other mood disorder - chronic anxiety, as well as grief today ?Most recent labs reviewed. ?I have personally reviewed and have noted: ?1) the patient's medical and social history ?2) The patient's current medications and supplements ?3) The patient's height, weight, and BMI have been recorded in the chart ? ?

## 2021-04-16 NOTE — Assessment & Plan Note (Signed)
With grief today, declines referral counseling or med change at this time ?

## 2021-04-16 NOTE — Assessment & Plan Note (Signed)
BP Readings from Last 3 Encounters:  ?04/16/21 (!) 88/54  ?03/28/21 (!) 100/58  ?02/22/21 100/78  ? ?Low today I supsect related to transient lower po intake due to dental issue and grief, pt to continue medical treatment except to hold lasix and zebeta for 5 days, follow BP at home closely, call for persistent low bp ? ?

## 2021-04-16 NOTE — Assessment & Plan Note (Signed)
Lab Results  ?Component Value Date  ? [id number] 228 04/16/2021  ? ?Low, reminded to start oral replacement - b12 1000 mcg qd ? ?

## 2021-04-16 NOTE — Progress Notes (Signed)
Patient ID: NDIA SAMPATH, female   DOB: [birth year], 76 y.o.   MRN: [id number] ? ? ? ?     Chief Complaint:: wellness exam and Follow-up ? Grief, low BP, urinary frequency, smoker ? ?     HPI:  Kimberly [name] is a 76 y.o. female here for wellness exam; declines covid booster, tdap, shingrix; also declines mammogram, colonoscopy o/w up to date ?         ?              Also sad over favorite dog death 4 days ago with heart failure.  Still smoking, not ready to quit.  Not eating well, lost several lbs.  Not eating as well, has been taking all meds including both diuretic.  Pt denies chest pain, increased sob or doe, wheezing, orthopnea, PND, increased LE swelling, palpitations, or syncope, except has been more dizzy last several days with bending forward and standing back up. Xanax working ok for grief, declines counseling referall.  Did have a skin cancer non melanoma removed per derm, has another found on the left leg, will need that off soon. Did have temp 102 after last flu shot, does not want covid booster.  Last seen per cardiology Dr Aundra Dubin earlier this month,  has not taken lasix for 2 days.  Also mentions had recent left upper gum abscess, drained spontaneously, saw dental with 2 teeth pulled.   Pt denies polydipsia, polyuria, or new focal neuro s/s.    Pt denies fever, wt loss, night sweats, loss of appetite, or other constitutional symptoms    Denies urinary symptoms such as dysuria, urgency, flank pain, hematuria or n/v, fever, chills, though has several days of urinary frequency.  ?Wt Readings from Last 3 Encounters:  ?04/16/21 108 lb 9.6 oz (49.3 kg)  ?03/28/21 111 lb 3.2 oz (50.4 kg)  ?02/22/21 108 lb 2 oz (49 kg)  ? ?BP Readings from Last 3 Encounters:  ?04/16/21 (!) 88/54  ?03/28/21 (!) 100/58  ?02/22/21 100/78  ? ?Immunization History  ?Administered Date(s) Administered  ? Influenza Split 10/15/2020  ? Influenza, High Dose Seasonal PF 09/30/2018  ? Influenza,inj,Quad PF,6+ Mos 10/17/2015  ?  Influenza,inj,quad, With Preservative 11/17/2016  ? Influenza-Unspecified 09/20/2013, 10/13/2017, 09/30/2018, 10/13/2019, 10/13/2019  ? PFIZER(Purple Top)SARS-COV-2 Vaccination 03/13/2019, 04/05/2019  ? Pneumococcal Conjugate-13 02/20/2010, 11/23/2012  ? Pneumococcal Polysaccharide-23 10/27/2017  ? Tdap 02/20/2009  ? ?There are no preventive care reminders to display for this patient. ? ?  ? ?Past Medical History:  ?Diagnosis Date  ? AICD (automatic cardioverter/defibrillator) present 03/17/2017  ? Anxiety   ? Atrial tachycardia ([place])   ? Bursitis of shoulder, right, adhesive   ? Cancer [facility])   ? CHF (congestive heart failure) ([place])   ? Chronic bronchitis ([place])   ? "1-2 times/yr" (01/23/2014)  ? COPD (chronic obstructive pulmonary disease) ([place])   ? CVD (cerebrovascular disease)   ? Dyslipidemia   ? Dysrhythmia   ? Frequency of urination   ? GERD (gastroesophageal reflux disease)   ? Heart murmur   ? History of stomach ulcers   ? HTN (hypertension) 02/22/2011  ? Migraines   ? "stopped many years ago" (06/14/2014)  ? Osteoporosis 08/19/2016  ? Pericarditis   ? Pneumonia "10 times" (06/14/2014)  ? 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 Silent myocardial infarction [facility]) "late 1990's"  ? Stress incontinence   ? "was suppose to have been tacked up years ago but I didn't do it"  ?  Syncope, near   ? Associated with atrial tachycardia-event recorder 1/16  ? Thoracic outlet syndrome   ? VSD (ventricular septal defect)   ? ?Past Surgical History:  ?Procedure Laterality Date  ? ABDOMINAL AORTOGRAM W/LOWER EXTREMITY N/A 12/17/2016  ? Procedure: ABDOMINAL AORTOGRAM W/LOWER EXTREMITY;  Surgeon: Wellington Hampshire, MD;  Location: [place] [facility];  Service: Cardiovascular;  Laterality: N/A;  ? BIV ICD INSERTION CRT-D N/A 03/17/2017  ? Procedure: BIV ICD INSERTION CRT-D;  Surgeon: Evans Lance, MD;  Location: [place] [facility];  Service: Cardiovascular;  Laterality: N/A;  ? CARDIAC  CATHETERIZATION  "quite a few"  ? [place]  ? CHOLECYSTECTOMY OPEN  1970's  ? CORONARY ANGIOGRAM  2000  ? No significant CAD  ? ELECTROPHYSIOLOGIC STUDY N/A 06/14/2014  ? Procedure: A-Flutter/A-Tach/SVT Ablation;  Surgeon: Evans Lance, MD;  Location: [place] [facility];  Service: Cardiovascular;  Laterality: N/A;  ? INSERTION OF ICD  03/17/2017  ? BIV  ? MULTIPLE TOOTH EXTRACTIONS    ? MYRINGOTOMY WITH TUBE PLACEMENT Right 2015  ? PERIPHERAL VASCULAR INTERVENTION  12/17/2016  ? Procedure: PERIPHERAL VASCULAR INTERVENTION;  Surgeon: Wellington Hampshire, MD;  Location: [place] [facility];  Service: Cardiovascular;;  Right common femoral PTA and Stent  ? RIGHT/LEFT HEART CATH AND CORONARY ANGIOGRAPHY Bilateral 11/06/2016  ? Procedure: RIGHT/LEFT HEART CATH AND CORONARY ANGIOGRAPHY;  Surgeon: Wellington Hampshire, MD;  Location: [place] [facility];  Service: Cardiovascular;  Laterality: Bilateral;  ? SVT ABLATION  06/14/2014  ? TUBAL LIGATION  1972  ? VSD [place]; 1967  ? ? reports that she has been smoking cigarettes. She has a 11.55 pack-year smoking history. She has never used smokeless tobacco. She reports that she does not currently use alcohol after a past usage of about 1.0 standard drink per week. She reports that she does not use drugs. ?family history includes Alcohol abuse in an other family member; Arthritis in some other family members; Cancer in her father, mother, and another family member; Hypertension in some other family members; Stroke in some other family members; Throat cancer in an other family member. ?Allergies  ?Allergen Reactions  ? Mupirocin Other (See Comments)  ?  Burning, pain, swelling and sob  ? Codeine Nausea Only  ? ?Current Outpatient Medications on File Prior to Visit  ?Medication Sig Dispense Refill  ? albuterol (VENTOLIN HFA) 108 (90 Base) MCG/ACT inhaler INHALE 2 PUFFS INTO THE LUNGS EVERY 6 HOURS AS NEEDED FOR WHEEZING OR SHORTNESS OF BREATH 8.5 g 0  ?  ALPRAZolam (XANAX) 0.5 MG tablet TAKE 1 TABLET BY MOUTH TWICE PER DAY AS NEEDED 60 tablet 2  ? aspirin 81 MG tablet Take 1 tablet (81 mg total) by mouth daily. 100 tablet 99  ? bisoprolol (ZEBETA) 5 MG tablet Take 1 tablet (5 mg total) by mouth daily. 90 tablet 3  ? Budeson-Glycopyrrol-Formoterol (BREZTRI AEROSPHERE) 160-9-4.8 MCG/ACT AERO Inhale 2 puffs into the lungs 2 (two) times daily. 32.1 g 3  ? digoxin (LANOXIN) 0.125 MG tablet TAKE 1/2 TABLET BY MOUTH DAILY 45 tablet 3  ? furosemide (LASIX) 20 MG tablet Take 1 tablet (20 mg total) by mouth every other day. 30 tablet 0  ? ibuprofen (ADVIL,MOTRIN) 200 MG tablet Take 200 mg by mouth daily as needed for headache.    ? rosuvastatin (CRESTOR) 10 MG tablet TAKE 1 TABLET BY MOUTH DAILY 90 tablet 3  ? sacubitril-valsartan (ENTRESTO) 24-26 MG Take 1 tablet by  mouth 2 (two) times daily. 180 tablet 3  ? spironolactone (ALDACTONE) 25 MG tablet Take 12.5 mg by mouth at bedtime.    ? ?No current facility-administered medications on file prior to visit.  ? ?     ROS:  All others reviewed and negative. ? ?Objective  ? ?     PE:  BP (!) 88/54 (BP Location: Left Arm, Patient Position: Sitting, Cuff Size: Normal)   Pulse 60   Temp 99.1 ?F (37.3 ?C) (Oral)   Ht '5\' 1"'$  (1.549 m)   Wt 108 lb 9.6 oz (49.3 kg)   SpO2 99%   BMI 20.52 kg/m?  ? ?              Constitutional: Pt appears in NAD ?              HENT: Head: NCAT.  ?              Right Ear: External ear normal.   ?              Left Ear: External ear normal.  ?              Eyes: . Pupils are equal, round, and reactive to light. Conjunctivae and EOM are normal ?              Nose: without d/c or deformity ?              Neck: Neck supple. Gross normal ROM ?              Cardiovascular: Normal rate and regular rhythm.   ?              Pulmonary/Chest: Effort normal and breath sounds without rales or wheezing.  ?              Abd:  Soft, NT, ND, + BS, no organomegaly ?              Neurological: Pt is alert. At baseline  orientation, motor grossly intact ?              Skin: Skin is warm. No rashes, no other new lesions, LE edema - none ?              Psychiatric: Pt behavior is normal without agitation , 2+ nervous ? ?Micro: none ? ?Cardiac tracings I hav

## 2021-04-16 NOTE — Patient Instructions (Addendum)
Ok to hold the lasix and zebeta for 5 days, then check BP at home; ok to restart if not dizziness and BP improved to about 110/60 or better.   ? ?Please continue all other medications as before, and refills have been done if requested - tussionex ? ?Please have the pharmacy call with any other refills you may need. ? ?Please continue your efforts at being more active, low cholesterol diet, and weight control. ? ?You are otherwise up to date with prevention measures today. ? ?Please keep your appointments with your specialists as you may have planned ? ?Please go to the LAB at the blood drawing area for the tests to be done ? ?You will be contacted by phone if any changes need to be made immediately.  Otherwise, you will receive a letter about your results with an explanation, but please check with MyChart first. ? ?Please remember to sign up for MyChart if you have not done so, as this will be important to you in the future with finding out test results, communicating by private email, and scheduling acute appointments online when needed. ? ?Please make an Appointment to return in 6 months, or sooner if needed ? ? ? ?

## 2021-04-23 DIAGNOSIS — C44729 Squamous cell carcinoma of skin of left lower limb, including hip: Secondary | ICD-10-CM | POA: Diagnosis not present

## 2021-04-23 DIAGNOSIS — I872 Venous insufficiency (chronic) (peripheral): Secondary | ICD-10-CM | POA: Diagnosis not present

## 2021-05-01 ENCOUNTER — Encounter: Payer: Medicare Other | Admitting: Student

## 2021-05-12 ENCOUNTER — Emergency Department
Admission: EM | Admit: 2021-05-12 | Discharge: 2021-05-12 | Disposition: A | Discharge status: Home / Self Care | Payer: Medicare Other | Attending: Emergency Medicine | Admitting: Emergency Medicine

## 2021-05-12 ENCOUNTER — Emergency Department: Payer: Medicare Other

## 2021-05-12 ENCOUNTER — Other Ambulatory Visit: Payer: Self-pay

## 2021-05-12 DIAGNOSIS — I509 Heart failure, unspecified: Secondary | ICD-10-CM | POA: Diagnosis not present

## 2021-05-12 DIAGNOSIS — J439 Emphysema, unspecified: Secondary | ICD-10-CM | POA: Diagnosis not present

## 2021-05-12 DIAGNOSIS — J449 Chronic obstructive pulmonary disease, unspecified: Secondary | ICD-10-CM | POA: Diagnosis not present

## 2021-05-12 DIAGNOSIS — J40 Bronchitis, not specified as acute or chronic: Secondary | ICD-10-CM | POA: Diagnosis not present

## 2021-05-12 DIAGNOSIS — R0602 Shortness of breath: Secondary | ICD-10-CM | POA: Diagnosis not present

## 2021-05-12 DIAGNOSIS — R079 Chest pain, unspecified: Secondary | ICD-10-CM | POA: Diagnosis not present

## 2021-05-12 DIAGNOSIS — R0600 Dyspnea, unspecified: Secondary | ICD-10-CM

## 2021-05-12 LAB — DIFFERENTIAL
Abs Immature Granulocytes: 0.02 10*3/uL (ref 0.00–0.07)
Basophils Absolute: 0.1 10*3/uL (ref 0.0–0.1)
Basophils Relative: 1 %
Eosinophils Absolute: 0 10*3/uL (ref 0.0–0.5)
Eosinophils Relative: 0 %
Immature Granulocytes: 0 %
Lymphocytes Relative: 13 %
Lymphs Abs: 1.1 10*3/uL (ref 0.7–4.0)
Monocytes Absolute: 0.9 10*3/uL (ref 0.1–1.0)
Monocytes Relative: 11 %
Neutro Abs: 6.4 10*3/uL (ref 1.7–7.7)
Neutrophils Relative %: 75 %

## 2021-05-12 LAB — BASIC METABOLIC PANEL
Anion gap: 5 (ref 5–15)
BUN: 26 mg/dL — ABNORMAL HIGH (ref 8–23)
CO2: 27 mmol/L (ref 22–32)
Calcium: 8.6 mg/dL — ABNORMAL LOW (ref 8.9–10.3)
Chloride: 101 mmol/L (ref 98–111)
Creatinine, Ser: 1.37 mg/dL — ABNORMAL HIGH (ref 0.44–1.00)
GFR, Estimated: 41 mL/min — ABNORMAL LOW (ref >60)
Glucose, Bld: 171 mg/dL — ABNORMAL HIGH (ref 70–99)
Potassium: 3.6 mmol/L (ref 3.5–5.1)
Sodium: 133 mmol/L — ABNORMAL LOW (ref 135–145)

## 2021-05-12 LAB — CBC
HCT: 36.6 % (ref 36.0–46.0)
Hemoglobin: 11.7 g/dL — ABNORMAL LOW (ref 12.0–15.0)
MCH: 29.8 pg (ref 26.0–34.0)
MCHC: 32 g/dL (ref 30.0–36.0)
MCV: 93.1 fL (ref 80.0–100.0)
Platelets: 291 10*3/uL (ref 150–400)
RBC: 3.93 MIL/uL (ref 3.87–5.11)
RDW: 14.2 % (ref 11.5–15.5)
WBC: 8.5 10*3/uL (ref 4.0–10.5)
nRBC: 0 % (ref 0.0–0.2)

## 2021-05-12 LAB — HEPATIC FUNCTION PANEL
ALT: 14 U/L (ref 0–44)
AST: 20 U/L (ref 15–41)
Albumin: 3.4 g/dL — ABNORMAL LOW (ref 3.5–5.0)
Alkaline Phosphatase: 60 U/L (ref 38–126)
Bilirubin, Direct: 0.3 mg/dL — ABNORMAL HIGH (ref 0.0–0.2)
Indirect Bilirubin: 1.2 mg/dL — ABNORMAL HIGH (ref 0.3–0.9)
Total Bilirubin: 1.5 mg/dL — ABNORMAL HIGH (ref 0.3–1.2)
Total Protein: 7.1 g/dL (ref 6.5–8.1)

## 2021-05-12 LAB — BRAIN NATRIURETIC PEPTIDE: B Natriuretic Peptide: 1085.8 pg/mL — ABNORMAL HIGH (ref 0.0–100.0)

## 2021-05-12 LAB — DIGOXIN LEVEL: Digoxin Level: 0.8 ng/mL (ref 0.8–2.0)

## 2021-05-12 LAB — PROCALCITONIN: Procalcitonin: 0.27 ng/mL

## 2021-05-12 LAB — TROPONIN I (HIGH SENSITIVITY): Troponin I (High Sensitivity): 17 ng/L (ref <18)

## 2021-05-12 MED ORDER — IPRATROPIUM-ALBUTEROL 0.5-2.5 (3) MG/3ML IN SOLN
3.0000 mL | Freq: Once | RESPIRATORY_TRACT | Status: AC
  Administered 2021-05-12: 3 mL via RESPIRATORY_TRACT
  Filled 2021-05-12: qty 3

## 2021-05-12 MED ORDER — PREDNISONE 10 MG PO TABS: ORAL_TABLET | ORAL | 0 refills | Status: DC

## 2021-05-12 MED ORDER — AMOXICILLIN-POT CLAVULANATE 875-125 MG PO TABS
1.0000 | ORAL_TABLET | Freq: Once | ORAL | Status: AC
  Administered 2021-05-12: 1 via ORAL
  Filled 2021-05-12: qty 1

## 2021-05-12 MED ORDER — PREDNISONE 20 MG PO TABS
20.0000 mg | ORAL_TABLET | Freq: Once | ORAL | Status: AC
  Administered 2021-05-12: 20 mg via ORAL
  Filled 2021-05-12: qty 1

## 2021-05-12 MED ORDER — IOHEXOL 350 MG/ML SOLN
60.0000 mL | Freq: Once | INTRAVENOUS | Status: AC | PRN
  Administered 2021-05-12: 60 mL via INTRAVENOUS

## 2021-05-12 MED ORDER — AMOXICILLIN-POT CLAVULANATE 875-125 MG PO TABS: 1.0000 | ORAL_TABLET | Freq: Two times a day (BID) | ORAL | 0 refills | Status: AC

## 2021-05-12 NOTE — ED Triage Notes (Addendum)
Pt comes with c/o SOB. Pt states this started 3 days ago. Pt states some CP and has pacemaker/defib. ? ?Pt states no pain she is just short winded. Pt states no energy and her left lung hurts. Pt states fever Wednesday.  ? ?Pt has hx of COPD and CHF. Pt does take lasix. ? ?Pt has labored breathing and some accessory muscle use noted. ?

## 2021-05-12 NOTE — ED Provider Notes (Signed)
? ?[facility] ?Provider Note ? ? ? Event Date/Time  ? First MD Initiated Contact with Patient 05/12/21 1031   ?  (approximate) ? ? ?History  ? ?Shortness of Breath ? ? ?HPI ? ?Kimberly [name] is a 76 y.o. female who comes in complaining of left lung pleuritic chest pain and shortness of breath.  She reports she had a fever but that went away 2 days ago.  She is coughing but not producing anything.  She has a history of COPD and CHF. ? ?  ? ? ?Physical Exam  ? ?Triage Vital Signs: ?ED Triage Vitals  ?Enc Vitals Group  ?   BP 05/12/21 1000 (!) 99/52  ?   Pulse Rate 05/12/21 1000 (!) 58  ?   Resp 05/12/21 1000 (!) 22  ?   Temp 05/12/21 1000 98.5 ?F (36.9 ?C)  ?   Temp src --   ?   SpO2 05/12/21 1000 100 %  ?   Weight 05/12/21 1026 107 lb (48.5 kg)  ?   Height 05/12/21 1026 '5\' 1"'$  (1.549 m)  ?   Head Circumference --   ?   Peak Flow --   ?   Pain Score 05/12/21 0958 0  ?   Pain Loc --   ?   Pain Edu? --   ?   Excl. in [place]? --   ? ? ?Most recent vital signs: ?Vitals:  ? 05/12/21 1422 05/12/21 1430  ?BP: 126/64 (!) 103/52  ?Pulse: 95 60  ?Resp: (!) 36 19  ?Temp:    ?SpO2: 100% 98%  ? ? ? ?General: Awake, sitting stiffly and propping her back up against the back of the stretcher like she is tripoding.  She is not short of breath. ?CV:  Good peripheral perfusion.  Heart regular rate and rhythm no audible murmurs ?Resp:  Increased effort but no retractions some wheezing ?Abd:  No distention.  Nontender ?Extremities no edema ? ? ?ED Results / Procedures / Treatments  ? ?Labs ?(all labs ordered are listed, but only abnormal results are displayed) ?Labs Reviewed  ?BASIC METABOLIC PANEL - Abnormal; Notable for the following components:  ?    Result Value  ? Sodium 133 (*)   ? Glucose, Bld 171 (*)   ? BUN 26 (*)   ? Creatinine, Ser 1.37 (*)   ? Calcium 8.6 (*)   ? GFR, Estimated 41 (*)   ? All other components within normal limits  ?CBC - Abnormal; Notable for the following components:  ? Hemoglobin 11.7 (*)    ? All other components within normal limits  ?HEPATIC FUNCTION PANEL - Abnormal; Notable for the following components:  ? Albumin 3.4 (*)   ? Total Bilirubin 1.5 (*)   ? Bilirubin, Direct 0.3 (*)   ? Indirect Bilirubin 1.2 (*)   ? All other components within normal limits  ?BRAIN NATRIURETIC PEPTIDE - Abnormal; Notable for the following components:  ? B Natriuretic Peptide 1,085.8 (*)   ? All other components within normal limits  ?DIFFERENTIAL  ?DIGOXIN LEVEL  ?PROCALCITONIN  ?TROPONIN I (HIGH SENSITIVITY)  ?TROPONIN I (HIGH SENSITIVITY)  ? ? ? ?EKG ? ?EKG read interpreted by me shows paced rhythm with some PVCs rate of 72 ? ? ?RADIOLOGY ?Chest x-ray no focal infiltrates no sign of CHF.  Radiology thinks it is concerning for bronchitis. ? ? ?PROCEDURES: ? ?Critical Care performed:  ? ?Procedures ? ? ?MEDICATIONS ORDERED IN ED: ?Medications  ?ipratropium-albuterol (DUONEB)  0.5-2.5 (3) MG/3ML nebulizer solution 3 mL (3 mLs Nebulization Given 05/12/21 1212)  ?iohexol (OMNIPAQUE) 350 MG/ML injection 60 mL (60 mLs Intravenous Contrast Given 05/12/21 1407)  ? ? ? ?IMPRESSION / MDM / ASSESSMENT AND PLAN / ED COURSE  ?I reviewed the triage vital signs and the nursing notes. ? ?Patient walks but does not get short of breath although her O2 sats dropped.  After neb patient looks much more comfortable.  Says she feels at baseline.  D-dimer was positive but CT is negative for PE there does appear to be some changes suspicious for developing infection however.  We will give her some antibiotics and steroid burst and she will follow-up with her doctor in 2 days as planned. ?The patient is on the cardiac monitor to evaluate for evidence of arrhythmia and/or significant heart rate changes.  None have been seen. ? ?Clinical Course as of 05/12/21 1526  ?Sun May 12, 2021  ?1241 Procalcitonin: 0.27 [PM]  ?  ?Clinical Course User Index ?[PM] Nena Polio, MD  ? ? ? ?FINAL CLINICAL IMPRESSION(S) / ED DIAGNOSES  ? ?Final diagnoses:   ?Dyspnea, unspecified type  ?Bronchitis  ? ? ? ?Rx / DC Orders  ? ?ED Discharge Orders   ? ?      Ordered  ?  predniSONE (DELTASONE) 10 MG tablet       ? 05/12/21 1521  ?  amoxicillin-clavulanate (AUGMENTIN) 875-125 MG tablet  2 times daily       ? 05/12/21 1521  ? ?  ?  ? ?  ? ? ? ?Note:  This document was prepared using Dragon voice recognition software and may include unintentional dictation errors. ?  ?Nena Polio, MD ?05/12/21 1526 ? ?

## 2021-05-12 NOTE — ED Notes (Signed)
Pt ambulatory to toilet

## 2021-05-12 NOTE — ED Notes (Signed)
PT dc ppw provided. Followup and rx information given and reviewed. PT denies any questions at this time and provides verbal consent for DC. Pt assisted to lobby via wheelchair alert and oriented. ?

## 2021-05-12 NOTE — Discharge Instructions (Addendum)
I was worried about a blood clot but the CT did not show one.  There does seem to be a developing pneumonia on the right side with the CAT scan.  I will give you Augmentin 1 twice a day with food and prednisone burst.  Please follow-up with your doctors as planned.  Return for any worsening. ?

## 2021-05-12 NOTE — ED Notes (Addendum)
Pt ambulatory trial with o2 sat dropping below 90% only momentarily. PT back to bed denying any shob at this time and o2 sat over 90% in bed upon return. ?

## 2021-05-12 NOTE — ED Notes (Signed)
Pt taken to xray at this time. Xray states they will bring pt to ED 19 once exam is complete. ?

## 2021-05-14 ENCOUNTER — Encounter: Payer: Self-pay | Admitting: Internal Medicine

## 2021-05-14 ENCOUNTER — Ambulatory Visit (INDEPENDENT_AMBULATORY_CARE_PROVIDER_SITE_OTHER): Payer: Medicare Other | Admitting: Internal Medicine

## 2021-05-14 VITALS — BP 108/58 | HR 49 | Temp 97.9°F | Ht 61.0 in | Wt 107.1 lb

## 2021-05-14 DIAGNOSIS — J441 Chronic obstructive pulmonary disease with (acute) exacerbation: Secondary | ICD-10-CM | POA: Diagnosis not present

## 2021-05-14 DIAGNOSIS — F17210 Nicotine dependence, cigarettes, uncomplicated: Secondary | ICD-10-CM

## 2021-05-14 DIAGNOSIS — J988 Other specified respiratory disorders: Secondary | ICD-10-CM | POA: Diagnosis not present

## 2021-05-14 DIAGNOSIS — K7460 Unspecified cirrhosis of liver: Secondary | ICD-10-CM | POA: Diagnosis not present

## 2021-05-14 DIAGNOSIS — J9601 Acute respiratory failure with hypoxia: Secondary | ICD-10-CM

## 2021-05-14 MED ORDER — HYDROCOD POLI-CHLORPHE POLI ER 10-8 MG/5ML PO SUER: 5.0000 mL | Freq: Two times a day (BID) | ORAL | 0 refills | Status: DC | PRN

## 2021-05-14 MED ORDER — TIZANIDINE HCL 2 MG PO TABS: 2.0000 mg | ORAL_TABLET | Freq: Four times a day (QID) | ORAL | 1 refills | Status: DC | PRN

## 2021-05-14 MED ORDER — METHYLPREDNISOLONE ACETATE 80 MG/ML IJ SUSP
80.0000 mg | Freq: Once | INTRAMUSCULAR | Status: AC
  Administered 2021-05-14: 80 mg via INTRAMUSCULAR

## 2021-05-14 MED ORDER — CEFTRIAXONE SODIUM 1 G IJ SOLR
1.0000 g | Freq: Once | INTRAMUSCULAR | Status: AC
  Administered 2021-05-14: 1 g via INTRAMUSCULAR

## 2021-05-14 NOTE — Patient Instructions (Addendum)
You had the steroid shot today. And the antibiotic shot today ? ?Please take all new medication as prescribed - the muscle relaxer as needed ? ?Please continue all other medications as before, and refills have been done if requested - the cough medicine ? ?Please have the pharmacy call with any other refills you may need. ? ?Please keep your appointments with your specialists as you may have planned ? ?Please make an Appointment to return in 3 months, or sooner if needed ?

## 2021-05-14 NOTE — Progress Notes (Signed)
Patient ID: Kimberly [name], female   DOB: 10/09/1945, 76 y.o.   MRN: [id number] ? ? ? ?    Chief Complaint: follow up recent CAP, copd exacerbation ? ?     HPI:  Kimberly [name] is a 76 y.o. female here after recent episode 6 days fever, chills up to 101.9, with pleuritic CP, seen at [facility] ED apr 23 with mild desaturation and wheezing, Dx with RLL CAP but declined admission, tx with augmentin and prednisone.  Also noted on CTA chest was hepatic cirrhosis.  Today pt does not have desaturations with exertion, wheezing is mild persistnet, and c/o occasional leg muscle cramps at night.  Pt denies worsening chest pain, orthopnea, PND, increased LE swelling, palpitations, dizziness or syncope.   Pt denies polydipsia, polyuria, or new focal neuro s/s.    Pt denies new wt loss, night sweats, loss of appetite, or other constitutional symptoms  ?      ?Wt Readings from Last 3 Encounters:  ?05/14/21 107 lb 1.6 oz (48.6 kg)  ?05/12/21 107 lb (48.5 kg)  ?04/16/21 108 lb 9.6 oz (49.3 kg)  ? ?BP Readings from Last 3 Encounters:  ?05/14/21 (!) 108/58  ?05/12/21 100/77  ?04/16/21 (!) 88/54  ? ?      ?Past Medical History:  ?Diagnosis Date  ? AICD (automatic cardioverter/defibrillator) present 03/17/2017  ? Anxiety   ? Atrial tachycardia ([place])   ? Bursitis of shoulder, right, adhesive   ? Cancer [facility])   ? CHF (congestive heart failure) ([place])   ? Chronic bronchitis ([place])   ? "1-2 times/yr" (01/23/2014)  ? COPD (chronic obstructive pulmonary disease) ([place])   ? CVD (cerebrovascular disease)   ? Dyslipidemia   ? Dysrhythmia   ? Frequency of urination   ? GERD (gastroesophageal reflux disease)   ? Heart murmur   ? History of stomach ulcers   ? HTN (hypertension) 02/22/2011  ? Migraines   ? "stopped many years ago" (06/14/2014)  ? Osteoporosis 08/19/2016  ? Pericarditis   ? Pneumonia "10 times" (06/14/2014)  ? 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 Silent myocardial infarction [facility]) "late 1990's"  ? Stress  incontinence   ? "was suppose to have been tacked up years ago but I didn't do it"  ? Syncope, near   ? Associated with atrial tachycardia-event recorder 1/16  ? Thoracic outlet syndrome   ? VSD (ventricular septal defect)   ? ?Past Surgical History:  ?Procedure Laterality Date  ? ABDOMINAL AORTOGRAM W/LOWER EXTREMITY N/A 12/17/2016  ? Procedure: ABDOMINAL AORTOGRAM W/LOWER EXTREMITY;  Surgeon: Wellington Hampshire, MD;  Location: [place] CV LAB;  Service: Cardiovascular;  Laterality: N/A;  ? BIV ICD INSERTION CRT-D N/A 03/17/2017  ? Procedure: BIV ICD INSERTION CRT-D;  Surgeon: Evans Lance, MD;  Location: [place] CV LAB;  Service: Cardiovascular;  Laterality: N/A;  ? CARDIAC CATHETERIZATION  "quite a few"  ? [place]  ? CHOLECYSTECTOMY OPEN  1970's  ? CORONARY ANGIOGRAM  2000  ? No significant CAD  ? ELECTROPHYSIOLOGIC STUDY N/A 06/14/2014  ? Procedure: A-Flutter/A-Tach/SVT Ablation;  Surgeon: Evans Lance, MD;  Location: [place] CV LAB;  Service: Cardiovascular;  Laterality: N/A;  ? INSERTION OF ICD  03/17/2017  ? BIV  ? MULTIPLE TOOTH EXTRACTIONS    ? MYRINGOTOMY WITH TUBE PLACEMENT Right 2015  ? PERIPHERAL VASCULAR INTERVENTION  12/17/2016  ? Procedure: PERIPHERAL VASCULAR INTERVENTION;  Surgeon: Wellington Hampshire, MD;  Location:  [place] INVASIVE CV LAB;  Service: Cardiovascular;;  Right common femoral PTA and Stent  ? RIGHT/LEFT HEART CATH AND CORONARY ANGIOGRAPHY Bilateral 11/06/2016  ? Procedure: RIGHT/LEFT HEART CATH AND CORONARY ANGIOGRAPHY;  Surgeon: Wellington Hampshire, MD;  Location: [place] CV LAB;  Service: Cardiovascular;  Laterality: Bilateral;  ? SVT ABLATION  06/14/2014  ? TUBAL LIGATION  1972  ? VSD [place]; 1967  ? ? reports that she has been smoking cigarettes. She has a 11.55 pack-year smoking history. She has never used smokeless tobacco. She reports that she does not currently use alcohol after a past usage of about 1.0 standard drink per week. She reports  that she does not use drugs. ?family history includes Alcohol abuse in an other family member; Arthritis in some other family members; Cancer in her father, mother, and another family member; Hypertension in some other family members; Stroke in some other family members; Throat cancer in an other family member. ?Allergies  ?Allergen Reactions  ? Mupirocin Other (See Comments)  ?  Burning, pain, swelling and sob  ? Codeine Nausea Only  ? ?Current Outpatient Medications on File Prior to Visit  ?Medication Sig Dispense Refill  ? albuterol (VENTOLIN HFA) 108 (90 Base) MCG/ACT inhaler INHALE 2 PUFFS INTO THE LUNGS EVERY 6 HOURS AS NEEDED FOR WHEEZING OR SHORTNESS OF BREATH 8.5 g 0  ? ALPRAZolam (XANAX) 0.5 MG tablet TAKE 1 TABLET BY MOUTH TWICE PER DAY AS NEEDED 60 tablet 2  ? amoxicillin-clavulanate (AUGMENTIN) 875-125 MG tablet Take 1 tablet by mouth 2 (two) times daily for 10 days. 20 tablet 0  ? aspirin 81 MG tablet Take 1 tablet (81 mg total) by mouth daily. 100 tablet 99  ? bisoprolol (ZEBETA) 5 MG tablet Take 1 tablet (5 mg total) by mouth daily. 90 tablet 3  ? Budeson-Glycopyrrol-Formoterol (BREZTRI AEROSPHERE) 160-9-4.8 MCG/ACT AERO Inhale 2 puffs into the lungs 2 (two) times daily. 32.1 g 3  ? digoxin (LANOXIN) 0.125 MG tablet TAKE 1/2 TABLET BY MOUTH DAILY 45 tablet 3  ? furosemide (LASIX) 20 MG tablet Take 1 tablet (20 mg total) by mouth every other day. 30 tablet 0  ? ibuprofen (ADVIL,MOTRIN) 200 MG tablet Take 200 mg by mouth daily as needed for headache.    ? predniSONE (DELTASONE) 10 MG tablet Take 2 pills a day for 4 days and then stop.  Be sure to follow-up with your doctors as planned. 30 tablet 0  ? rosuvastatin (CRESTOR) 10 MG tablet TAKE 1 TABLET BY MOUTH DAILY 90 tablet 3  ? sacubitril-valsartan (ENTRESTO) 24-26 MG Take 1 tablet by mouth 2 (two) times daily. 180 tablet 3  ? spironolactone (ALDACTONE) 25 MG tablet Take 12.5 mg by mouth at bedtime.    ? ?No current facility-administered medications  on file prior to visit.  ? ?     ROS:  All others reviewed and negative. ? ?Objective  ? ?     PE:  BP (!) 108/58 (BP Location: Left Arm, Patient Position: Sitting, Cuff Size: Normal)   Pulse (!) 49   Temp 97.9 ?F (36.6 ?C) (Oral)   Ht '5\' 1"'$  (1.549 m)   Wt 107 lb 1.6 oz (48.6 kg)   SpO2 94%   BMI 20.24 kg/m?  ? ?              Constitutional: Pt appears in NAD ?              HENT: Head: NCAT.  ?  Right Ear: External ear normal.   ?              Left Ear: External ear normal.  ?              Eyes: . Pupils are equal, round, and reactive to light. Conjunctivae and EOM are normal ?              Nose: without d/c or deformity ?              Neck: Neck supple. Gross normal ROM ?              Cardiovascular: Normal rate and regular rhythm.   ?              Pulmonary/Chest: Effort normal and breath sounds decreased without rales but with few bialteral wheezing.  ?              Abd:  Soft, NT, ND, + BS, no organomegaly ?              Neurological: Pt is alert. At baseline orientation, motor grossly intact ?              Skin: Skin is warm. No rashes, no other new lesions, LE edema - none ?              Psychiatric: Pt behavior is normal without agitation  ? ?Micro: none ? ?Cardiac tracings I have personally interpreted today:  none ? ?Pertinent Radiological findings (summarize): none  ? ?Lab Results  ?Component Value Date  ? WBC 8.5 05/12/2021  ? HGB 11.7 (L) 05/12/2021  ? HCT 36.6 05/12/2021  ? PLT 291 05/12/2021  ? GLUCOSE 171 (H) 05/12/2021  ? CHOL 166 04/16/2021  ? TRIG 72.0 04/16/2021  ? HDL 89.50 04/16/2021  ? LDLDIRECT 112.6 03/16/2013  ? [place] 62 04/16/2021  ? ALT 14 05/12/2021  ? AST 20 05/12/2021  ? NA 133 (L) 05/12/2021  ? K 3.6 05/12/2021  ? CL 101 05/12/2021  ? CREATININE 1.37 (H) 05/12/2021  ? BUN 26 (H) 05/12/2021  ? CO2 27 05/12/2021  ? TSH 0.87 04/16/2021  ? INR 1.02 12/15/2016  ? HGBA1C 6.3 04/16/2021  ? ?Assessment/Plan:  ?Kimberly [name] is a 76 y.o. White or Caucasian [1] female with   has a past medical history of AICD (automatic cardioverter/defibrillator) present (03/17/2017), Anxiety, Atrial tachycardia ([place]), Bursitis of shoulder, right, adhesive, Cancer ([place]), CHF (congestive heart failur

## 2021-05-18 ENCOUNTER — Encounter: Payer: Self-pay | Admitting: Internal Medicine

## 2021-05-18 DIAGNOSIS — K7460 Unspecified cirrhosis of liver: Secondary | ICD-10-CM | POA: Insufficient documentation

## 2021-05-18 DIAGNOSIS — J988 Other specified respiratory disorders: Secondary | ICD-10-CM | POA: Insufficient documentation

## 2021-05-18 DIAGNOSIS — J9601 Acute respiratory failure with hypoxia: Secondary | ICD-10-CM | POA: Insufficient documentation

## 2021-05-18 NOTE — Assessment & Plan Note (Signed)
Pt counsled to quit, pt not ready °

## 2021-05-18 NOTE — Assessment & Plan Note (Signed)
Improved, today for depomedrol im 80, finished prednisone, for cough med prn ?

## 2021-05-18 NOTE — Assessment & Plan Note (Addendum)
Noted on recent CT,  to f/u any worsening symptoms or concerns, declines GI referral for now ?

## 2021-05-18 NOTE — Assessment & Plan Note (Signed)
By CAT c/w RLL CAP - improved, today for rocephin 1 gm, finish augmentin ?

## 2021-05-18 NOTE — Assessment & Plan Note (Signed)
Improved, does not seem to require hospn today ?

## 2021-05-20 NOTE — Progress Notes (Signed)
? ? ?Electrophysiology Office Note ?Date: 05/20/2021 ? ?ID:  Kimberly [name], DOB [birth year], MRN [id number] ? ?PCP: Biagio Borg, MD ?Primary Cardiologist: None ?Electrophysiologist: Cristopher Peru, MD  ? ?CC: Routine ICD follow-up ? ?Kimberly [name] is a 76 y.o. female seen today for Cristopher Peru, MD for routine electrophysiology followup.  Since last being seen in our clinic the patient reports doing OK from a cardiac perspective.  ? ?She has been struggling with PNA and being treated as an outpatient, despite recommendations for admission. She remains on Antibiotics. Bisoprolol held by PCP with low BP.   Has had some dizziness/lightheadedness and fatigue. Continues with cough. No syncope. Drinking Pedialyte and liberalize her salt intake.  ? ?Device History: ?BSCi CRT-D, implanted 03/17/2017 ? ?Past Medical History:  ?Diagnosis Date  ? AICD (automatic cardioverter/defibrillator) present 03/17/2017  ? Anxiety   ? Atrial tachycardia ([place])   ? Bursitis of shoulder, right, adhesive   ? Cancer [facility])   ? CHF (congestive heart failure) ([place])   ? Chronic bronchitis ([place])   ? "1-2 times/yr" (01/23/2014)  ? COPD (chronic obstructive pulmonary disease) ([place])   ? CVD (cerebrovascular disease)   ? Dyslipidemia   ? Dysrhythmia   ? Frequency of urination   ? GERD (gastroesophageal reflux disease)   ? Heart murmur   ? History of stomach ulcers   ? HTN (hypertension) 02/22/2011  ? Migraines   ? "stopped many years ago" (06/14/2014)  ? Osteoporosis 08/19/2016  ? Pericarditis   ? Pneumonia "10 times" (06/14/2014)  ? 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 Silent myocardial infarction [facility]) "late 1990's"  ? Stress incontinence   ? "was suppose to have been tacked up years ago but I didn't do it"  ? Syncope, near   ? Associated with atrial tachycardia-event recorder 1/16  ? Thoracic outlet syndrome   ? VSD (ventricular septal defect)   ? ?Past Surgical History:  ?Procedure Laterality Date  ? ABDOMINAL  AORTOGRAM W/LOWER EXTREMITY N/A 12/17/2016  ? Procedure: ABDOMINAL AORTOGRAM W/LOWER EXTREMITY;  Surgeon: Wellington Hampshire, MD;  Location: [place] CV LAB;  Service: Cardiovascular;  Laterality: N/A;  ? BIV ICD INSERTION CRT-D N/A 03/17/2017  ? Procedure: BIV ICD INSERTION CRT-D;  Surgeon: Evans Lance, MD;  Location: [place] CV LAB;  Service: Cardiovascular;  Laterality: N/A;  ? CARDIAC CATHETERIZATION  "quite a few"  ? [place]  ? CHOLECYSTECTOMY OPEN  1970's  ? CORONARY ANGIOGRAM  2000  ? No significant CAD  ? ELECTROPHYSIOLOGIC STUDY N/A 06/14/2014  ? Procedure: A-Flutter/A-Tach/SVT Ablation;  Surgeon: Evans Lance, MD;  Location: [place] CV LAB;  Service: Cardiovascular;  Laterality: N/A;  ? INSERTION OF ICD  03/17/2017  ? BIV  ? MULTIPLE TOOTH EXTRACTIONS    ? MYRINGOTOMY WITH TUBE PLACEMENT Right 2015  ? PERIPHERAL VASCULAR INTERVENTION  12/17/2016  ? Procedure: PERIPHERAL VASCULAR INTERVENTION;  Surgeon: Wellington Hampshire, MD;  Location: [place] CV LAB;  Service: Cardiovascular;;  Right common femoral PTA and Stent  ? RIGHT/LEFT HEART CATH AND CORONARY ANGIOGRAPHY Bilateral 11/06/2016  ? Procedure: RIGHT/LEFT HEART CATH AND CORONARY ANGIOGRAPHY;  Surgeon: Wellington Hampshire, MD;  Location: [place] CV LAB;  Service: Cardiovascular;  Laterality: Bilateral;  ? SVT ABLATION  06/14/2014  ? TUBAL LIGATION  1972  ? VSD [place]; 1967  ? ? ?Current Outpatient Medications  ?Medication Sig Dispense Refill  ? albuterol (VENTOLIN HFA) 108 (90 Base)  MCG/ACT inhaler INHALE 2 PUFFS INTO THE LUNGS EVERY 6 HOURS AS NEEDED FOR WHEEZING OR SHORTNESS OF BREATH 8.5 g 0  ? ALPRAZolam (XANAX) 0.5 MG tablet TAKE 1 TABLET BY MOUTH TWICE PER DAY AS NEEDED 60 tablet 2  ? amoxicillin-clavulanate (AUGMENTIN) 875-125 MG tablet Take 1 tablet by mouth 2 (two) times daily for 10 days. 20 tablet 0  ? aspirin 81 MG tablet Take 1 tablet (81 mg total) by mouth daily. 100 tablet 99  ? bisoprolol  (ZEBETA) 5 MG tablet Take 1 tablet (5 mg total) by mouth daily. 90 tablet 3  ? Budeson-Glycopyrrol-Formoterol (BREZTRI AEROSPHERE) 160-9-4.8 MCG/ACT AERO Inhale 2 puffs into the lungs 2 (two) times daily. 32.1 g 3  ? chlorpheniramine-HYDROcodone (TUSSIONEX PENNKINETIC ER) 10-8 MG/5ML Take 5 mLs by mouth every 12 (twelve) hours as needed for cough. 180 mL 0  ? digoxin (LANOXIN) 0.125 MG tablet TAKE 1/2 TABLET BY MOUTH DAILY 45 tablet 3  ? furosemide (LASIX) 20 MG tablet Take 1 tablet (20 mg total) by mouth every other day. 30 tablet 0  ? ibuprofen (ADVIL,MOTRIN) 200 MG tablet Take 200 mg by mouth daily as needed for headache.    ? predniSONE (DELTASONE) 10 MG tablet Take 2 pills a day for 4 days and then stop.  Be sure to follow-up with your doctors as planned. 30 tablet 0  ? rosuvastatin (CRESTOR) 10 MG tablet TAKE 1 TABLET BY MOUTH DAILY 90 tablet 3  ? sacubitril-valsartan (ENTRESTO) 24-26 MG Take 1 tablet by mouth 2 (two) times daily. 180 tablet 3  ? spironolactone (ALDACTONE) 25 MG tablet Take 12.5 mg by mouth at bedtime.    ? tiZANidine (ZANAFLEX) 2 MG tablet Take 1 tablet (2 mg total) by mouth every 6 (six) hours as needed for muscle spasms. 40 tablet 1  ? ?No current facility-administered medications for this visit.  ? ? ?Allergies:   Mupirocin and Codeine  ? ?Social History: ?Social History  ? ?Socioeconomic History  ? Marital status: Married  ?  Spouse name: Not on file  ? Number of children: Not on file  ? Years of education: 23  ? Highest education level: Not on file  ?Occupational History  ? Occupation: retired  ?Tobacco Use  ? Smoking status: Light Smoker  ?  Packs/day: 0.33  ?  Years: 35.00  ?  Pack years: 11.55  ?  Types: Cigarettes  ? Smokeless tobacco: Never  ? Tobacco comments:  ?  3-4 cigarettes daily. Smoked 1971-present.   ?Vaping Use  ? Vaping Use: Never used  ?Substance and Sexual Activity  ? Alcohol use: Not Currently  ?  Alcohol/week: 1.0 standard drink  ?  Types: 1 Glasses of wine per week   ?  Comment: 06/14/2014 "glass of wine maybe once/month"  ? Drug use: No  ? Sexual activity: Never  ?Other Topics Concern  ? Not on file  ?Social History Narrative  ? Pt lives with husband.  ? ?Social Determinants of Health  ? ?Financial Resource Strain: Not on file  ?Food Insecurity: Not on file  ?Transportation Needs: Not on file  ?Physical Activity: Not on file  ?Stress: Not on file  ?Social Connections: Not on file  ?Intimate Partner Violence: Not on file  ? ? ?Family History: ?Family History  ?Problem Relation Age of Onset  ? Cancer Mother   ? Cancer Father   ? Throat cancer Other   ? Hypertension Other   ? Stroke Other   ? Alcohol abuse Other   ?  Arthritis Other   ? Cancer Other   ?     lung cancer  ? Hypertension Other   ? Arthritis Other   ? Stroke Other   ? Breast cancer Neg Hx   ? ? ?Review of Systems: ?All other systems reviewed and are otherwise negative except as noted above. ? ? ?Physical Exam: ?There were no vitals filed for this visit.  ? ?GEN- The patient is well appearing, alert and oriented x 3 today.   ?HEENT: normocephalic, atraumatic; sclera clear, conjunctiva pink; hearing intact; oropharynx clear; neck supple, no JVP ?Lymph- no cervical lymphadenopathy ?Lungs- Clear to ausculation bilaterally, normal work of breathing.  No wheezes, rales, rhonchi ?Heart- Regular rate and rhythm, no murmurs, rubs or gallops, PMI not laterally displaced ?GI- soft, non-tender, non-distended, bowel sounds present, no hepatosplenomegaly ?Extremities- no clubbing or cyanosis. No edema; DP/PT/radial pulses 2+ bilaterally ?MS- no significant deformity or atrophy ?Skin- warm and dry, no rash or lesion; ICD pocket well healed ?Psych- euthymic mood, full affect ?Neuro- strength and sensation are intact ? ?ICD interrogation- reviewed in detail today,  See PACEART report ? ?EKG:  EKG is not ordered today. ?Personal review of EKG ordered  05/13/2021  shows BiV paced with atypical RBBB ? ?Recent Labs: ?04/16/2021: TSH  0.87 ?05/12/2021: ALT 14; B Natriuretic Peptide 1,085.8; BUN 26; Creatinine, Ser 1.37; Hemoglobin 11.7; Platelets 291; Potassium 3.6; Sodium 133  ? ?Wt Readings from Last 3 Encounters:  ?05/14/21 107 lb 1.6 oz (48.6 kg)  ?0

## 2021-05-21 ENCOUNTER — Encounter: Payer: Self-pay | Admitting: Internal Medicine

## 2021-05-23 ENCOUNTER — Ambulatory Visit (INDEPENDENT_AMBULATORY_CARE_PROVIDER_SITE_OTHER): Payer: Medicare Other | Admitting: Internal Medicine

## 2021-05-23 ENCOUNTER — Encounter: Payer: Self-pay | Admitting: Internal Medicine

## 2021-05-23 VITALS — BP 94/50 | HR 43 | Temp 97.9°F | Ht 61.0 in | Wt 106.0 lb

## 2021-05-23 DIAGNOSIS — F17210 Nicotine dependence, cigarettes, uncomplicated: Secondary | ICD-10-CM

## 2021-05-23 DIAGNOSIS — J988 Other specified respiratory disorders: Secondary | ICD-10-CM | POA: Diagnosis not present

## 2021-05-23 DIAGNOSIS — R001 Bradycardia, unspecified: Secondary | ICD-10-CM

## 2021-05-23 DIAGNOSIS — L03116 Cellulitis of left lower limb: Secondary | ICD-10-CM | POA: Diagnosis not present

## 2021-05-23 DIAGNOSIS — J9601 Acute respiratory failure with hypoxia: Secondary | ICD-10-CM

## 2021-05-23 MED ORDER — AZITHROMYCIN 250 MG PO TABS: ORAL_TABLET | ORAL | 1 refills | Status: AC

## 2021-05-23 MED ORDER — BUPROPION HCL ER (XL) 150 MG PO TB24: 150.0000 mg | ORAL_TABLET | Freq: Every day | ORAL | 3 refills | Status: DC

## 2021-05-23 NOTE — Progress Notes (Signed)
Patient ID: Kimberly [name], female   DOB: 1945/06/18, 76 y.o.   MRN: [id number] ? ? ? ?    Chief Complaint: follow up recent pulmonary symptoms, new left leg redness, low heart rate, persistent smoking ? ?     HPI:  Kimberly [name] is a 76 y.o. female here with c/o recent cough and wheezing with sob/doe now improved to baseline.  Denies fever, chills, worsening prod cough, wheezing, sob or doe.  Does have persistent fatigue and intemrittent dizziness and nted to have lower BP and HR over baseline today on BB.  Also incidentally has 2 days onset area of red, tender, swelling to mid lower leg without ulcer, red streaks or drainage.  Still smoking, unable to quit, pt willing to start wellbutrin as was not able to tolerate chantix in past.   ?      ?Wt Readings from Last 3 Encounters:  ?05/27/21 105 lb (47.6 kg)  ?05/23/21 106 lb (48.1 kg)  ?05/14/21 107 lb 1.6 oz (48.6 kg)  ? ?BP Readings from Last 3 Encounters:  ?05/27/21 (!) 78/42  ?05/23/21 (!) 94/50  ?05/14/21 (!) 108/58  ? ?      ?Past Medical History:  ?Diagnosis Date  ? AICD (automatic cardioverter/defibrillator) present 03/17/2017  ? Anxiety   ? Atrial tachycardia ([place])   ? Bursitis of shoulder, right, adhesive   ? Cancer [facility])   ? CHF (congestive heart failure) ([place])   ? Chronic bronchitis ([place])   ? "1-2 times/yr" (01/23/2014)  ? COPD (chronic obstructive pulmonary disease) ([place])   ? CVD (cerebrovascular disease)   ? Dyslipidemia   ? Dysrhythmia   ? Frequency of urination   ? GERD (gastroesophageal reflux disease)   ? Heart murmur   ? History of stomach ulcers   ? HTN (hypertension) 02/22/2011  ? Migraines   ? "stopped many years ago" (06/14/2014)  ? Osteoporosis 08/19/2016  ? Pericarditis   ? Pneumonia "10 times" (06/14/2014)  ? 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 Silent myocardial infarction [facility]) "late 1990's"  ? Stress incontinence   ? "was suppose to have been tacked up years ago but I didn't do it"  ? Syncope, near   ?  Associated with atrial tachycardia-event recorder 1/16  ? Thoracic outlet syndrome   ? VSD (ventricular septal defect)   ? ?Past Surgical History:  ?Procedure Laterality Date  ? ABDOMINAL AORTOGRAM W/LOWER EXTREMITY N/A 12/17/2016  ? Procedure: ABDOMINAL AORTOGRAM W/LOWER EXTREMITY;  Surgeon: Wellington Hampshire, MD;  Location: [place] CV [facility];  Service: Cardiovascular;  Laterality: N/A;  ? BIV ICD INSERTION CRT-D N/A 03/17/2017  ? Procedure: BIV ICD INSERTION CRT-D;  Surgeon: Evans Lance, MD;  Location: [place] [facility];  Service: Cardiovascular;  Laterality: N/A;  ? CARDIAC CATHETERIZATION  "quite a few"  ? [place]  ? CHOLECYSTECTOMY OPEN  1970's  ? CORONARY ANGIOGRAM  2000  ? No significant CAD  ? ELECTROPHYSIOLOGIC STUDY N/A 06/14/2014  ? Procedure: A-Flutter/A-Tach/SVT Ablation;  Surgeon: Evans Lance, MD;  Location: [place] CV [facility];  Service: Cardiovascular;  Laterality: N/A;  ? INSERTION OF ICD  03/17/2017  ? BIV  ? MULTIPLE TOOTH EXTRACTIONS    ? MYRINGOTOMY WITH TUBE PLACEMENT Right 2015  ? PERIPHERAL VASCULAR INTERVENTION  12/17/2016  ? Procedure: PERIPHERAL VASCULAR INTERVENTION;  Surgeon: Wellington Hampshire, MD;  Location: [place] [facility];  Service: Cardiovascular;;  Right common femoral PTA and Stent  ?  RIGHT/LEFT HEART CATH AND CORONARY ANGIOGRAPHY Bilateral 11/06/2016  ? Procedure: RIGHT/LEFT HEART CATH AND CORONARY ANGIOGRAPHY;  Surgeon: Wellington Hampshire, MD;  Location: [place] CV LAB;  Service: Cardiovascular;  Laterality: Bilateral;  ? SVT ABLATION  06/14/2014  ? TUBAL LIGATION  1972  ? VSD [place]; 1967  ? ? reports that she has been smoking cigarettes. She has a 11.55 pack-year smoking history. She has never used smokeless tobacco. She reports that she does not currently use alcohol after a past usage of about 1.0 standard drink per week. She reports that she does not use drugs. ?family history includes Alcohol abuse in an other family member; Arthritis in  some other family members; Cancer in her father, mother, and another family member; Hypertension in some other family members; Stroke in some other family members; Throat cancer in an other family member. ?Allergies  ?Allergen Reactions  ? Mupirocin Other (See Comments)  ?  Burning, pain, swelling and sob  ? Codeine Nausea Only  ? ?Current Outpatient Medications on File Prior to Visit  ?Medication Sig Dispense Refill  ? albuterol (VENTOLIN HFA) 108 (90 Base) MCG/ACT inhaler INHALE 2 PUFFS INTO THE LUNGS EVERY 6 HOURS AS NEEDED FOR WHEEZING OR SHORTNESS OF BREATH 8.5 g 0  ? ALPRAZolam (XANAX) 0.5 MG tablet TAKE 1 TABLET BY MOUTH TWICE PER DAY AS NEEDED 60 tablet 2  ? aspirin 81 MG tablet Take 1 tablet (81 mg total) by mouth daily. 100 tablet 99  ? bisoprolol (ZEBETA) 5 MG tablet Take 1 tablet (5 mg total) by mouth daily. 90 tablet 3  ? Budeson-Glycopyrrol-Formoterol (BREZTRI AEROSPHERE) 160-9-4.8 MCG/ACT AERO Inhale 2 puffs into the lungs 2 (two) times daily. 32.1 g 3  ? chlorpheniramine-HYDROcodone (TUSSIONEX PENNKINETIC ER) 10-8 MG/5ML Take 5 mLs by mouth every 12 (twelve) hours as needed for cough. 180 mL 0  ? digoxin (LANOXIN) 0.125 MG tablet TAKE 1/2 TABLET BY MOUTH DAILY 45 tablet 3  ? furosemide (LASIX) 20 MG tablet Take 1 tablet (20 mg total) by mouth every other day. 30 tablet 0  ? ibuprofen (ADVIL,MOTRIN) 200 MG tablet Take 200 mg by mouth daily as needed for headache.    ? rosuvastatin (CRESTOR) 10 MG tablet TAKE 1 TABLET BY MOUTH DAILY 90 tablet 3  ? sacubitril-valsartan (ENTRESTO) 24-26 MG Take 1 tablet by mouth 2 (two) times daily. 180 tablet 3  ? spironolactone (ALDACTONE) 25 MG tablet Take 12.5 mg by mouth at bedtime.    ? tiZANidine (ZANAFLEX) 2 MG tablet Take 1 tablet (2 mg total) by mouth every 6 (six) hours as needed for muscle spasms. 40 tablet 1  ? ?No current facility-administered medications on file prior to visit.  ? ?     ROS:  All others reviewed and negative. ? ?Objective  ? ?     PE:  BP  (!) 94/50 (BP Location: Left Arm, Patient Position: Sitting, Cuff Size: Normal)   Pulse (!) 43   Temp 97.9 ?F (36.6 ?C) (Oral)   Ht '5\' 1"'$  (1.549 m)   Wt 106 lb (48.1 kg)   SpO2 100%   BMI 20.03 kg/m?  ? ?              Constitutional: Pt appears in NAD ?              HENT: Head: NCAT.  ?              Right Ear: External ear normal.   ?  Left Ear: External ear normal.  ?              Eyes: . Pupils are equal, round, and reactive to light. Conjunctivae and EOM are normal ?              Nose: without d/c or deformity ?              Neck: Neck supple. Gross normal ROM ?              Cardiovascular: Normal rate and regular rhythm.   ?              Pulmonary/Chest: Effort normal and breath sounds decresaed without rales or wheezing.  ?              Abd:  Soft, NT, ND, + BS, no organomegaly ?              Neurological: Pt is alert. At baseline orientation, motor grossly intact ?              Skin:  LE edema - trace LLE with 2 cm area red, tender swelling mid left anterior leg approx 2 cm above the ankle ?              Psychiatric: Pt behavior is normal without agitation  ? ?Micro: none ? ?Cardiac tracings I have personally interpreted today:  none ? ?Pertinent Radiological findings (summarize): none  ? ?Lab Results  ?Component Value Date  ? WBC 8.5 05/12/2021  ? HGB 11.7 (L) 05/12/2021  ? HCT 36.6 05/12/2021  ? PLT 291 05/12/2021  ? GLUCOSE 114 (H) 05/27/2021  ? CHOL 166 04/16/2021  ? TRIG 72.0 04/16/2021  ? HDL 89.50 04/16/2021  ? LDLDIRECT 112.6 03/16/2013  ? [place] 62 04/16/2021  ? ALT 14 05/12/2021  ? AST 20 05/12/2021  ? NA 140 05/27/2021  ? K 5.4 (H) 05/27/2021  ? CL 99 05/27/2021  ? CREATININE 1.44 (H) 05/27/2021  ? BUN 28 (H) 05/27/2021  ? CO2 24 05/27/2021  ? TSH 0.87 04/16/2021  ? INR 1.02 12/15/2016  ? HGBA1C 6.3 04/16/2021  ? ?Assessment/Plan:  ?Kimberly [name] is a 76 y.o. White or Caucasian [1] female with  has a past medical history of AICD (automatic cardioverter/defibrillator) present  (03/17/2017), Anxiety, Atrial tachycardia ([place]), Bursitis of shoulder, right, adhesive, Cancer ([place]), CHF (congestive heart failure) ([place]), Chronic bronchitis ([place]), COPD (chronic obstructive pulmonary disease) ([place]

## 2021-05-23 NOTE — Patient Instructions (Signed)
Ok to hold the zebeta for now with the low BP and pulse ? ?Please take all new medication as prescribed - the wellbutrin XL 150 per day for smoking ? ?Please take all new medication as prescribed - the antibiotic for the left leg ? ?Please continue all other medications as before, and refills have been done if requested. ? ?Please have the pharmacy call with any other refills you may need. ? ?Please continue your efforts at being more active, low cholesterol diet, and weight control. ? ?You are otherwise up to date with prevention measures today. ? ?Please keep your appointments with your specialists as you may have planned - cardiology on Monday ? ?Please make an Appointment to return in 3 months, or sooner if needed ?

## 2021-05-27 ENCOUNTER — Ambulatory Visit: Payer: Medicare Other | Admitting: Student

## 2021-05-27 ENCOUNTER — Encounter: Payer: Self-pay | Admitting: Student

## 2021-05-27 VITALS — BP 78/42 | HR 91 | Ht 61.0 in | Wt 105.0 lb

## 2021-05-27 DIAGNOSIS — I2510 Atherosclerotic heart disease of native coronary artery without angina pectoris: Secondary | ICD-10-CM

## 2021-05-27 DIAGNOSIS — Z95810 Presence of automatic (implantable) cardiac defibrillator: Secondary | ICD-10-CM | POA: Diagnosis not present

## 2021-05-27 DIAGNOSIS — I5022 Chronic systolic (congestive) heart failure: Secondary | ICD-10-CM | POA: Diagnosis not present

## 2021-05-27 LAB — CUP PACEART INCLINIC DEVICE CHECK
Date Time Interrogation Session: 20230508104703
HighPow Impedance: 78 Ohm
Implantable Lead Implant Date: 20190226
Implantable Lead Implant Date: 20190226
Implantable Lead Implant Date: 20190226
Implantable Lead Location: 753858
Implantable Lead Location: 753859
Implantable Lead Location: 753860
Implantable Lead Model: 292
Implantable Lead Model: 4671
Implantable Lead Model: 7740
Implantable Lead Serial Number: 444536
Implantable Lead Serial Number: 726363
Implantable Lead Serial Number: 805938
Implantable Pulse Generator Implant Date: 20190226
Lead Channel Impedance Value: 473 Ohm
Lead Channel Impedance Value: 567 Ohm
Lead Channel Impedance Value: 752 Ohm
Lead Channel Pacing Threshold Amplitude: 0.6 V
Lead Channel Pacing Threshold Amplitude: 0.8 V
Lead Channel Pacing Threshold Amplitude: 1.3 V
Lead Channel Pacing Threshold Pulse Width: 0.4 ms
Lead Channel Pacing Threshold Pulse Width: 0.4 ms
Lead Channel Pacing Threshold Pulse Width: 0.4 ms
Lead Channel Sensing Intrinsic Amplitude: 19.8 mV
Lead Channel Sensing Intrinsic Amplitude: 23.5 mV
Lead Channel Sensing Intrinsic Amplitude: 6.8 mV
Lead Channel Setting Pacing Amplitude: 2 V
Lead Channel Setting Pacing Amplitude: 2.5 V
Lead Channel Setting Pacing Amplitude: 2.6 V
Lead Channel Setting Pacing Pulse Width: 0.4 ms
Lead Channel Setting Pacing Pulse Width: 0.4 ms
Lead Channel Setting Sensing Sensitivity: 0.5 mV
Lead Channel Setting Sensing Sensitivity: 1 mV
Pulse Gen Serial Number: 204176

## 2021-05-27 LAB — BASIC METABOLIC PANEL
BUN/Creatinine Ratio: 19 (ref 12–28)
BUN: 28 mg/dL — ABNORMAL HIGH (ref 8–27)
CO2: 24 mmol/L (ref 20–29)
Calcium: 9.4 mg/dL (ref 8.7–10.3)
Chloride: 99 mmol/L (ref 96–106)
Creatinine, Ser: 1.44 mg/dL — ABNORMAL HIGH (ref 0.57–1.00)
Glucose: 114 mg/dL — ABNORMAL HIGH (ref 70–99)
Potassium: 5.4 mmol/L — ABNORMAL HIGH (ref 3.5–5.2)
Sodium: 140 mmol/L (ref 134–144)
eGFR: 38 mL/min/{1.73_m2} — ABNORMAL LOW (ref >59)

## 2021-05-27 NOTE — Patient Instructions (Signed)
Medication Instructions:  ?Your physician has recommended you make the following change in your medication:  ? ?HOLD: Entresto until your appointment with Heart Failure Clinic ?DO NOT TAKE LASIX TOMORROW 5/9 ?HOLD: Bisoprolol until your blood pressure is above 100 ?HOLD: Digoxin the morning of your appointment with Heart Failure Clinic ? ?*If you need a refill on your cardiac medications before your next appointment, please call your pharmacy* ? ? ?Lab Work: ?TODAY: BMET ? ?If you have labs (blood work) drawn today and your tests are completely normal, you will receive your results only by: ?MyChart Message (if you have MyChart) OR ?A paper copy in the mail ?If you have any lab test that is abnormal or we need to change your treatment, we will call you to review the results. ? ? ?Follow-Up: ?At [facility], you and your health needs are our priority.  As part of our continuing mission to provide you with exceptional heart care, we have created designated Provider Care Teams.  These Care Teams include your primary Cardiologist (physician) and Advanced Practice Providers (APPs -  Physician Assistants and Nurse Practitioners) who all work together to provide you with the care you need, when you need it. ? ?Your next appointment:   ?6 month(s) ? ?The format for your next appointment:   ?In Person ? ?Provider:   ?Cristopher Peru, MD{ ?  ?

## 2021-05-28 ENCOUNTER — Encounter (HOSPITAL_COMMUNITY): Payer: Medicare Other

## 2021-05-29 ENCOUNTER — Other Ambulatory Visit (HOSPITAL_COMMUNITY): Payer: Self-pay | Admitting: Cardiology

## 2021-05-29 ENCOUNTER — Encounter: Payer: Self-pay | Admitting: Internal Medicine

## 2021-05-29 DIAGNOSIS — R001 Bradycardia, unspecified: Secondary | ICD-10-CM | POA: Insufficient documentation

## 2021-05-29 NOTE — Assessment & Plan Note (Signed)
Mild to mod, for antibx course,  to f/u any worsening symptoms or concerns 

## 2021-05-29 NOTE — Assessment & Plan Note (Signed)
Currently resolved,  to f/u any worsening symptoms or concerns ?

## 2021-05-29 NOTE — Assessment & Plan Note (Signed)
D/w pt - ok for wellbutrin XL 150 qd ?

## 2021-05-29 NOTE — Assessment & Plan Note (Signed)
Unfortunately low today, possible related to dizziness and weakness, pt advised to HOLD the zebeta until next wk when coincidetnly has planned cardiology f/u appt ?

## 2021-06-03 NOTE — Progress Notes (Signed)
?PCP: Dr. Jenny Reichmann ?Cardiology: Dr. Harrington Challenger ?HF Cardiology: Dr. Aundra Dubin ?Cardiology/Vascular: Dr Fletcher Anon ? ?76 y.o.with history of VSD repair as a child, COPD/active smoking, PAD, and chronic systolic CHF. ? ?Patient has a long history of cardiomyopathy.  She had VSD repaired as a child and imaging has not showed residual VSD.  Cardiac MRI in 11/09 showed EF 36%.  Echo in 12/15 showed EF 40-45%.  Echo in 3/18 showed EF down to 15-20% with moderate RV systolic dysfunction.  RHC/LHC in 10/18 showed nonobstructive coronary but was concerning for low output (CI 1.33).  She had a nonhealing right lower extremity wound.  Peripheral angiogram with bilateral CFA occlusions and underwent DCB/self expanding stent to to right CFA.   ? ?CPX in 1/19 showed moderate to severe functional limitation, combination of HF and lung disease, probably more related to the lung disease.  PFTs in 12/18 showed severe COPD.  ? 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   ? ?7/19 peripheral arterial dopplers showed significant in-stent restenosis right CFA stent. ? ?Echo in 4/21 showed EF 35-40% with mild AI.  CPX in 5/21 showed severe COPD, mild-moderate HF limitation.  11/21 Cardiolite showed prior septal and apical infarction, no ischemia.  ? ?Had COVID April 2022. She has continued to recover.  ? ?Echo in 9/22 showed EF 35-40% with basal-mid septal akinesis and anterior hypokinesis, no evidence for residual VSD, mildly decreased RV systolic function.  ? ?Follow up 3/23, mild volume overload and Lasix increased to every other day.  ? ?Seen in the ED 05/12/21 with PNA, given prednisone and abx. Saw PCP for follow up, bisoprolol held with low BP. Seen by EP 05/27/21, BP low (70-80s), advised to skip lasix dose the next day and hold Entresto until HF follow up. ? ?Today she returns for HF follow up with her husband. Overall feeling fair. Recovering from PNA and feels weak. She feels better but still having some SOB with activity. Denies  palpitations, CP, dizziness, edema, or PND/Orthopnea. Appetite ok. No fever or chills. Weight at home 105 pounds. Taking all medications. Having issues with pharmacy not re-filling digoxin. Smoking 1 pack every 3 days. ? ?Development worker, international aid: Heartlogic Score 0, no AF, 1/2 hrs activity (personally reviewed). ? ?Labs (12/22): BNP 418, digoxin 0.5, K 5, creatinine 1.17 ?Labs (3/23): LDL 62, HDL 89 ?Labs (5/23): K 5.4, creatinine 1.44 ? ?PMH: ?1. VSD: s/p repair at [facility] as child.  From description, sounds like muscular VSD.  ?2. Atrial tachycardia s/p ablation.  ?3. Hyperlipidemia ?4. COPD: Active smoker.  ?- PFTs (12/18): Severe obstructive lung disease.  ?5. Atrial fibrillation: Paroxysmal.   Noted only transiently.   ?6. PAD:  ?- Angiogram 11/18 showed totally occluded bilateral CFAs.  Patient had stent to right CFA.  ABIs in 12/18 were normal on right. ?- ABIs (10/20): ABI 0.8 right, 0.74 left ?- 12/21 dopplers with R CFA stent in-stenosis.  ?7. Chronic systolic CHF: Nonischemic cardiomyopathy.   ?- cMRI (11/09): EF 36%, VSD patch in anterior ventricular septum ?- Echo (12/15): EF 40-45% ?- Echo (3/18): EF 15-20%, moderate LV dilation, moderate MR, moderate RV dilation with moderately decreased systolic function.  ?- LHC/RHC (10/18): 3+ MR, EF < 25%, minimal nonobstructive CAD; PA 41/21, LVEDP 18, CI 1.33.  ?- CPX (1/19): peak VO2 13.4, VE/VCO2 slope 43, RER 1.07.  Mod-severe functional limitation due to combination of HF and lung disease, probably more related to lung disease.  ?- [employer] CRT-D device.  ?- Echo (  7/19): EF 25-30%, mild LV dilation, mild MR, normal RV size and systolic function.  ?- Echo (4/21): EF 35-40%, mild AI ?- CPX (5/21): severe COPD, mild-moderate HF limitation.  ?- Cardiolite (11/21): EF 47%, prior septal and apical infarction, no ischemia. ?- Echo (9/22): EF 35-40% with basal-mid septal akinesis and anterior hypokinesis, no evidence for residual VSD, mildly  decreased RV systolic function.  ?8. PVCs: Zio patch 9/19 with 6% PVCs.  ?9. COVID-19 (4/22) ? ?SH: Smoker, lives in [place], married. Has 2 [place]ies. ? ?FH: Mother with PAD, sister died at birth from congenital heart disease.  ? ?ROS: All systems reviewed and negative except as per HPI.  ? ?Current Outpatient Medications  ?Medication Sig Dispense Refill  ? albuterol (VENTOLIN HFA) 108 (90 Base) MCG/ACT inhaler INHALE 2 PUFFS INTO THE LUNGS EVERY 6 HOURS AS NEEDED FOR WHEEZING OR SHORTNESS OF BREATH 8.5 g 0  ? ALPRAZolam (XANAX) 0.5 MG tablet TAKE 1 TABLET BY MOUTH TWICE PER DAY AS NEEDED 60 tablet 2  ? aspirin 81 MG tablet Take 1 tablet (81 mg total) by mouth daily. 100 tablet 99  ? Budeson-Glycopyrrol-Formoterol (BREZTRI AEROSPHERE) 160-9-4.8 MCG/ACT AERO Inhale 2 puffs into the lungs 2 (two) times daily. 32.1 g 3  ? buPROPion (WELLBUTRIN XL) 150 MG 24 hr tablet Take 1 tablet (150 mg total) by mouth daily. 90 tablet 3  ? chlorpheniramine-HYDROcodone (TUSSIONEX PENNKINETIC ER) 10-8 MG/5ML Take 5 mLs by mouth every 12 (twelve) hours as needed for cough. 180 mL 0  ? furosemide (LASIX) 20 MG tablet Take 1 tablet (20 mg total) by mouth every other day. 30 tablet 0  ? ibuprofen (ADVIL,MOTRIN) 200 MG tablet Take 200 mg by mouth daily as needed for headache.    ? rosuvastatin (CRESTOR) 10 MG tablet TAKE 1 TABLET BY MOUTH DAILY 90 tablet 3  ? sacubitril-valsartan (ENTRESTO) 24-26 MG Take 1 tablet by mouth 2 (two) times daily. 180 tablet 3  ? spironolactone (ALDACTONE) 25 MG tablet Take 12.5 mg by mouth at bedtime.    ? tiZANidine (ZANAFLEX) 2 MG tablet Take 1 tablet (2 mg total) by mouth every 6 (six) hours as needed for muscle spasms. 40 tablet 1  ? bisoprolol (ZEBETA) 5 MG tablet Take 1 tablet (5 mg total) by mouth daily. (Patient not taking: Reported on 06/05/2021) 90 tablet 3  ? digoxin (LANOXIN) 0.125 MG tablet TAKE 1/2 TABLET BY MOUTH DAILY (Patient not taking: Reported on 06/05/2021) 45 tablet 3  ? ?No current  facility-administered medications for this encounter.  ? ?BP 116/62   Pulse 61   Wt 50.1 kg   SpO2 95%   BMI 20.86 kg/m?   ? ?Wt Readings from Last 3 Encounters:  ?06/05/21 50.1 kg  ?05/27/21 47.6 kg  ?05/23/21 48.1 kg  ? ?Physical Exam: ?General:  NAD. No resp difficulty ?HEENT: Normal ?Neck: Supple. No JVD. Carotids 2+ bilat; no bruits. No lymphadenopathy or thryomegaly appreciated. ?Cor: PMI nondisplaced. Regular rate & rhythm. No rubs, gallops, 2/6 HSM LLSB ?Lungs: No wheezes, diminished throughout. ?Abdomen: Soft, nontender, nondistended. No hepatosplenomegaly. No bruits or masses. Good bowel sounds. ?Extremities: No cyanosis, clubbing, rash, edema ?Neuro: Alert & oriented x 3, cranial nerves grossly intact. Moves all 4 extremities w/o difficulty. Affect pleasant. ? ?Assessment/Plan: ?1.  PAD: She had endovascular intervention to occluded right CFA in 11/18, but subsequent arterial showed in-stent restenosis. She has occluded left CFA also that was not intervened on. Minimal claudication and no wounds/rest pain. She is still smoking. ?-  Medical management in absence significant symptoms.  ?- Continue statin.  ?- Discussed smoking cessation.  ?- Followed by Dr Fletcher Anon.   ?2. CAD: Nonobstructive on 10/18 cath. She has atypical chest pain at times with stress.  Symptoms are very brief and do not appear to be consistent with angina.  Cardiolite in 11/21 with no evidence of ischemia.  ?- She is on ASA 81 and statin.  ?3. Hyperlipidemia: Continue Crestor, good lipids 3/23. ?4. COPD: Severe by PFTs on 5/21 CPX. COPD plays a significant role in her dyspnea. She is still smoking.  ?5. Chronic systolic CHF: Nonischemic cardiomyopathy.  RHC/LHC in 10/18 with no nonobstructive coronary disease but CI was very low at 1.33.  Her low cardiac output was out of proportion to her symptoms.  [name] CRT-D device.  CPX in 5/21 showed severe COPD but only mild-moderate HF limitation.   Echo with EF stable at 35-40% in  9/22.  Stable NYHA II-early III, in setting of recent PNA and COPD. She is not volume overloaded today. ?- Restart bisoprolol 2.5 mg daily. ?- Continue Lasix 20 mg every other day. BMET today. ?- Continue Entresto 24/26 bid.

## 2021-06-04 DIAGNOSIS — Z743 Need for continuous supervision: Secondary | ICD-10-CM | POA: Diagnosis not present

## 2021-06-04 DIAGNOSIS — R0603 Acute respiratory distress: Secondary | ICD-10-CM | POA: Diagnosis not present

## 2021-06-05 ENCOUNTER — Encounter (HOSPITAL_COMMUNITY): Payer: Self-pay

## 2021-06-05 ENCOUNTER — Ambulatory Visit (HOSPITAL_COMMUNITY)
Admission: RE | Admit: 2021-06-05 | Discharge: 2021-06-05 | Disposition: A | Discharge status: Home / Self Care | Payer: Medicare Other | Source: Ambulatory Visit | Attending: Family Medicine | Admitting: Family Medicine

## 2021-06-05 VITALS — BP 116/62 | HR 61 | Wt 110.4 lb

## 2021-06-05 DIAGNOSIS — I2510 Atherosclerotic heart disease of native coronary artery without angina pectoris: Secondary | ICD-10-CM | POA: Diagnosis not present

## 2021-06-05 DIAGNOSIS — J449 Chronic obstructive pulmonary disease, unspecified: Secondary | ICD-10-CM | POA: Insufficient documentation

## 2021-06-05 DIAGNOSIS — Z7982 Long term (current) use of aspirin: Secondary | ICD-10-CM | POA: Diagnosis not present

## 2021-06-05 DIAGNOSIS — R0789 Other chest pain: Secondary | ICD-10-CM | POA: Insufficient documentation

## 2021-06-05 DIAGNOSIS — Z79899 Other long term (current) drug therapy: Secondary | ICD-10-CM | POA: Diagnosis not present

## 2021-06-05 DIAGNOSIS — E785 Hyperlipidemia, unspecified: Secondary | ICD-10-CM | POA: Diagnosis not present

## 2021-06-05 DIAGNOSIS — I5022 Chronic systolic (congestive) heart failure: Secondary | ICD-10-CM | POA: Insufficient documentation

## 2021-06-05 DIAGNOSIS — Z8774 Personal history of (corrected) congenital malformations of heart and circulatory system: Secondary | ICD-10-CM | POA: Insufficient documentation

## 2021-06-05 DIAGNOSIS — I428 Other cardiomyopathies: Secondary | ICD-10-CM | POA: Diagnosis not present

## 2021-06-05 DIAGNOSIS — F17200 Nicotine dependence, unspecified, uncomplicated: Secondary | ICD-10-CM | POA: Diagnosis not present

## 2021-06-05 DIAGNOSIS — I739 Peripheral vascular disease, unspecified: Secondary | ICD-10-CM | POA: Diagnosis not present

## 2021-06-05 LAB — DIGOXIN LEVEL: Digoxin Level: 0.5 ng/mL — ABNORMAL LOW (ref 0.8–2.0)

## 2021-06-05 LAB — BASIC METABOLIC PANEL
Anion gap: 9 (ref 5–15)
BUN: 17 mg/dL (ref 8–23)
CO2: 26 mmol/L (ref 22–32)
Calcium: 9 mg/dL (ref 8.9–10.3)
Chloride: 103 mmol/L (ref 98–111)
Creatinine, Ser: 1.23 mg/dL — ABNORMAL HIGH (ref 0.44–1.00)
GFR, Estimated: 46 mL/min — ABNORMAL LOW (ref >60)
Glucose, Bld: 122 mg/dL — ABNORMAL HIGH (ref 70–99)
Potassium: 3.7 mmol/L (ref 3.5–5.1)
Sodium: 138 mmol/L (ref 135–145)

## 2021-06-05 MED ORDER — BISOPROLOL FUMARATE 5 MG PO TABS: 2.5000 mg | ORAL_TABLET | Freq: Every day | ORAL | 3 refills | Status: DC

## 2021-06-05 MED ORDER — DIGOXIN 125 MCG PO TABS: 62.5000 ug | ORAL_TABLET | Freq: Every day | ORAL | 3 refills | Status: DC

## 2021-06-05 NOTE — Progress Notes (Signed)
Pt was seen in clinic today. Pt stated that she is out of her digoxin medication pt takes 1/2 tablet daily. And stated that she has 1 pill left in her bottled as of 06/05/2021. Called pt pharmacy at [phone] spoke with Estill Bamberg who stated that pt came into pharmacy Saturday stating she only had a 1/2 pill left to take her Dose Sunday 06/02/2021. Estill Bamberg stated that she gave her 3 pills which would get her to Saturday 06/08/21 and she would be able to refill her medication and insurance would pay. Estill Bamberg stated that she had this conversation with pt 4 times.  ?

## 2021-06-05 NOTE — Patient Instructions (Signed)
Medication Changes: ? ?Restart Bisoprolol at 2.5 mg (1/2 tab) Daily ? ?Lab Work: ? ?Labs done today, your results will be available in MyChart, we will contact you for abnormal readings. ? ? ?Testing/Procedures: ? ?None ? ?Referrals: ? ?None ? ?Special Instructions // Education: ? ?Your physician has requested that you regularly monitor and record your blood pressure readings at home. Please use the same machine at the same time of day to check your readings and record them to bring to your follow-up visit. We haven given you a prescription to get a BP cuff at home. ? ?Do the following things EVERYDAY: ?Weigh yourself in the morning before breakfast. Write it down and keep it in a log. ?Take your medicines as prescribed ?Eat low salt foods--Limit salt (sodium) to 2000 mg per day.  ?Stay as active as you can everyday ?Limit all fluids for the day to less than 2 liters ? ? ?Follow-Up in: 2-3 months ? ?At the [facility], you and your health needs are our priority. We have a designated team specialized in the treatment of Heart Failure. This Care Team includes your primary Heart Failure Specialized Cardiologist (physician), Advanced Practice Providers (APPs- Physician Assistants and Nurse Practitioners), and Pharmacist who all work together to provide you with the care you need, when you need it.  ? ?You may see any of the following providers on your designated Care Team at your next follow up: ? ?Dr Glori Bickers ?Dr Loralie Champagne ?Darrick Grinder, NP ?Lyda Jester, PA ?Jessica Milford,NP ?Marlyce Huge, PA ?Audry Riles, PharmD ? ? ?Please be sure to bring in all your medications bottles to every appointment.  ? ?Need to Contact us: ? ?If you have any questions or concerns before your next appointment please send [place] a message through [place] or call our office at [phone].   ? ?TO LEAVE A MESSAGE FOR THE NURSE SELECT OPTION 2, PLEASE LEAVE A MESSAGE INCLUDING: ?YOUR NAME ?DATE OF BIRTH ?CALL  BACK NUMBER ?REASON FOR CALL**this is important as we prioritize the call backs ? ?YOU WILL RECEIVE A CALL BACK THE SAME DAY AS LONG AS YOU CALL BEFORE 4:00 PM ? ? ?

## 2021-06-06 ENCOUNTER — Telehealth (HOSPITAL_COMMUNITY): Payer: Self-pay | Admitting: *Deleted

## 2021-06-06 NOTE — Telephone Encounter (Signed)
Patient left vm stating she forgot to mention her entresto during her office visit yesterday. Pt was told by EP to hold her entresto due to low bp until she saw HF clinic. Pt asked if she should restart entresto.

## 2021-06-07 NOTE — Telephone Encounter (Signed)
Spoke with pt she is aware and agreeable with plan.  

## 2021-06-12 NOTE — Progress Notes (Signed)
Advanced Heart Failure Clinic Note   PCP: Dr. Jenny Reichmann Cardiology: Dr. Harrington Challenger HF Cardiology: Dr. Aundra Dubin Cardiology/Vascular: Dr Fletcher Anon  HPI:  76 y.o.with history of VSD repair as a child, COPD/active smoking, PAD, and chronic systolic CHF.   Patient has a long history of cardiomyopathy.  She had VSD repaired as a child and imaging has not showed residual VSD.  Cardiac MRI in 11/2007 showed EF 36%.  Echo in 12/2013 showed EF 40-45%.  Echo in 03/2016 showed EF down to 15-20% with moderate RV systolic dysfunction.  RHC/LHC in 10/2016 showed nonobstructive coronary but was concerning for low output (CI 1.33).  She had a nonhealing right lower extremity wound.  Peripheral angiogram with bilateral CFA occlusions and underwent DCB/self expanding stent to to right CFA.     CPX in 01/2017 showed moderate to severe functional limitation, combination of HF and lung disease, probably more related to the lung disease.  PFTs in 12/2016 showed severe COPD.    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     07/2017 peripheral arterial dopplers showed significant in-stent restenosis right CFA stent.   Echo in 04/2019 showed EF 35-40% with mild AI.  CPX in 05/2019 showed severe COPD, mild-moderate HF limitation.  11/2019 Cardiolite showed prior septal and apical infarction, no ischemia.    Had COVID April 2022. She has continued to recover.    Echo in 09/2020 showed EF 35-40% with basal-mid septal akinesis and anterior hypokinesis, no evidence for residual VSD, mildly decreased RV systolic function.    Follow up 03/2021, mild volume overload and Lasix increased to every other day.    Seen in the ED 05/12/21 with PNA, given prednisone and abx. Saw PCP for follow up, bisoprolol held with low BP. Seen by EP 05/27/21, BP low (70-80s), advised to skip lasix dose the next day and hold Entresto until HF follow up.   Returned to HF Clinic for follow up with her husband 06/05/21. Overall was feeling fair. Was still  recovering from PNA and felt weak. She noted that she feels better but still having some SOB with activity. Denied palpitations, CP, dizziness, edema, or PND/Orthopnea. Appetite was ok. No fever or chills. Weight at home was 105 pounds. Reported taking all medications. Stated she was having issues with pharmacy not re-filling digoxin. Smoking 1 pack every 3 days.  Today she returns to HF clinic for pharmacist medication titration. At last visit with APP, bisoprolol 2.5 mg daily was restarted. She was referred to pharmacy clinic to assess whether it was appropriate to resume Entresto. Overall she is feeling well today. Dizziness has improved over the last few weeks. When she does get dizzy, she can tell her BP is dropping.  SBP at home has been 115-120, although she notes she stopped taking her BP as frequently at home. BP in clinic 112/76. She feels like her SOB has increased since Entresto was stopped. She gets out of breath quickly when walking short distances. Also notes that she has been very stressed lately (her husband lost his big toe) and will get short of breath/atypical CP when she is very emotional or stressed. Weight has been stable at 107 lbs at home. She has been taking Lasix PRN only, not every other day as scheduled. She did need 2 tablets of Lasix last week because she ate pizza and pork. No LEE, PND or orthopnea. Taking all medications as prescribed and tolerating all medications.     HF Medications: Bisoprolol 2.5 mg  daily Spironolactone 12.5 mg daily Digoxin 0.0625 mg daily Lasix 20 mg every other day - patient taking PRN   Has the patient been experiencing any side effects to the medications prescribed?  no  Does the patient have any problems obtaining medications due to transportation or finances?   [employer] Medicare. Has [employer] PAP for [employer].   Understanding of regimen: good Understanding of indications: good Potential of compliance: good Patient understands to avoid  NSAIDs. Patient understands to avoid decongestants.    Pertinent Lab Values: 06/05/21: Serum creatinine 1.23, BUN 17, Potassium 3.7, Sodium 138  Vital Signs: Weight: 108.8 lbs (last clinic weight: 110.4 lbs) Blood pressure: 112/76  Heart rate: 60   Assessment/Plan: 1.  PAD: She had endovascular intervention to occluded right CFA in 11/2016, but subsequent arterial showed in-stent restenosis. She has occluded left CFA also that was not intervened on. Minimal claudication and no wounds/rest pain. She is still smoking. - Medical management in absence of significant symptoms.  - Continue statin.  - Discussed smoking cessation.  - Followed by Dr Fletcher Anon.   2. CAD: Nonobstructive on 10/2016 cath. She has atypical chest pain at times with stress.  Symptoms are very brief and do not appear to be consistent with angina.  Cardiolite in 11/2019 with no evidence of ischemia.  - She is on ASA 81 and statin.  3. Hyperlipidemia: Continue Crestor, good lipids 03/2021. 4. COPD: Severe by PFTs on 05/2019 CPX. COPD plays a significant role in her dyspnea. She is still smoking.  5. Chronic systolic CHF: Nonischemic cardiomyopathy.  RHC/LHC in 10/2016 with no nonobstructive coronary disease but CI was very low at 1.33.  Her low cardiac output was out of proportion to her symptoms.  [name] CRT-D device.  CPX in 05/2019 showed severe COPD but only mild-moderate HF limitation.   Echo with EF stable at 35-40% in 09/2020.   -Stable NYHA II-early III, in setting of recent PNA and COPD. She is not volume overloaded today. - Continue Lasix 20 mg PRN only - Continue bisoprolol 2.5 mg daily. - Restart Entresto - take 1/2 tablet of the 24/26 mg strength twice daily for 1 week. If SBP stable and >110, increase to 1 tablet BID.  - Continue spironolactone 12.5 mg daily. Unable to tolerate higher doses. - Continue digoxin 0.0625 mg daily. - Unable to tolerate [place].     Follow up 2 months with Dr.  Aundra Dubin.   Audry Riles, PharmD, BCPS, BCCP, CPP Heart Failure Clinic Pharmacist [phone]

## 2021-06-18 ENCOUNTER — Ambulatory Visit (INDEPENDENT_AMBULATORY_CARE_PROVIDER_SITE_OTHER): Payer: Medicare Other

## 2021-06-18 ENCOUNTER — Other Ambulatory Visit (HOSPITAL_COMMUNITY): Payer: Self-pay | Admitting: Cardiology

## 2021-06-18 DIAGNOSIS — I5022 Chronic systolic (congestive) heart failure: Secondary | ICD-10-CM | POA: Diagnosis not present

## 2021-06-18 DIAGNOSIS — I420 Dilated cardiomyopathy: Secondary | ICD-10-CM

## 2021-06-18 LAB — CUP PACEART REMOTE DEVICE CHECK
Battery Remaining Longevity: 84 mo
Battery Remaining Percentage: 93 %
Brady Statistic RA Percent Paced: 46 %
Brady Statistic RV Percent Paced: 94 %
Date Time Interrogation Session: 20230529041100
HighPow Impedance: 72 Ohm
Implantable Lead Implant Date: 20190226
Implantable Lead Implant Date: 20190226
Implantable Lead Implant Date: 20190226
Implantable Lead Location: 753858
Implantable Lead Location: 753859
Implantable Lead Location: 753860
Implantable Lead Model: 292
Implantable Lead Model: 4671
Implantable Lead Model: 7740
Implantable Lead Serial Number: 444536
Implantable Lead Serial Number: 726363
Implantable Lead Serial Number: 805938
Implantable Pulse Generator Implant Date: 20190226
Lead Channel Impedance Value: 448 Ohm
Lead Channel Impedance Value: 525 Ohm
Lead Channel Impedance Value: 806 Ohm
Lead Channel Setting Pacing Amplitude: 2 V
Lead Channel Setting Pacing Amplitude: 2.5 V
Lead Channel Setting Pacing Amplitude: 2.6 V
Lead Channel Setting Pacing Pulse Width: 0.4 ms
Lead Channel Setting Pacing Pulse Width: 0.4 ms
Lead Channel Setting Sensing Sensitivity: 0.5 mV
Lead Channel Setting Sensing Sensitivity: 1 mV
Pulse Gen Serial Number: 204176

## 2021-06-18 MED ORDER — FUROSEMIDE 20 MG PO TABS: 20.0000 mg | ORAL_TABLET | ORAL | 1 refills | Status: DC

## 2021-06-20 DEATH — deceased

## 2021-06-27 ENCOUNTER — Ambulatory Visit (HOSPITAL_COMMUNITY)
Admission: RE | Admit: 2021-06-27 | Discharge: 2021-06-27 | Disposition: A | Discharge status: Home / Self Care | Payer: Medicare Other | Source: Ambulatory Visit | Attending: Cardiology | Admitting: Cardiology

## 2021-06-27 VITALS — BP 112/76 | HR 60 | Wt 108.8 lb

## 2021-06-27 DIAGNOSIS — I739 Peripheral vascular disease, unspecified: Secondary | ICD-10-CM | POA: Insufficient documentation

## 2021-06-27 DIAGNOSIS — I428 Other cardiomyopathies: Secondary | ICD-10-CM | POA: Insufficient documentation

## 2021-06-27 DIAGNOSIS — Z716 Tobacco abuse counseling: Secondary | ICD-10-CM | POA: Diagnosis not present

## 2021-06-27 DIAGNOSIS — Z8616 Personal history of COVID-19: Secondary | ICD-10-CM | POA: Insufficient documentation

## 2021-06-27 DIAGNOSIS — R0789 Other chest pain: Secondary | ICD-10-CM | POA: Insufficient documentation

## 2021-06-27 DIAGNOSIS — Z7982 Long term (current) use of aspirin: Secondary | ICD-10-CM | POA: Insufficient documentation

## 2021-06-27 DIAGNOSIS — J449 Chronic obstructive pulmonary disease, unspecified: Secondary | ICD-10-CM | POA: Insufficient documentation

## 2021-06-27 DIAGNOSIS — I5022 Chronic systolic (congestive) heart failure: Secondary | ICD-10-CM | POA: Insufficient documentation

## 2021-06-27 DIAGNOSIS — I2510 Atherosclerotic heart disease of native coronary artery without angina pectoris: Secondary | ICD-10-CM | POA: Diagnosis not present

## 2021-06-27 DIAGNOSIS — F17210 Nicotine dependence, cigarettes, uncomplicated: Secondary | ICD-10-CM | POA: Diagnosis not present

## 2021-06-27 DIAGNOSIS — Z95810 Presence of automatic (implantable) cardiac defibrillator: Secondary | ICD-10-CM | POA: Insufficient documentation

## 2021-06-27 DIAGNOSIS — E785 Hyperlipidemia, unspecified: Secondary | ICD-10-CM | POA: Insufficient documentation

## 2021-06-27 DIAGNOSIS — Z8774 Personal history of (corrected) congenital malformations of heart and circulatory system: Secondary | ICD-10-CM | POA: Diagnosis not present

## 2021-06-27 DIAGNOSIS — Z79899 Other long term (current) drug therapy: Secondary | ICD-10-CM | POA: Diagnosis not present

## 2021-06-27 MED ORDER — FUROSEMIDE 20 MG PO TABS
20.0000 mg | ORAL_TABLET | Freq: Every day | ORAL | 1 refills | Status: DC | PRN
Start: 2021-06-27 — End: 2022-12-22

## 2021-06-27 NOTE — Patient Instructions (Addendum)
It was a pleasure seeing you today!  MEDICATIONS: -We are changing your medications today -Restart Entresto. Take Entresto 1/2 tablet twice daily for 1 week. If the top number of your blood pressure is >110, you can increase to 1 tablet twice daily.  -Call if you have questions about your medications.   NEXT APPOINTMENT: Return to clinic in 2 months with Dr. Aundra Dubin.  In general, to take care of your heart failure: -Limit your fluid intake to 2 Liters (half-gallon) per day.   -Limit your salt intake to ideally 2-3 grams (2000-3000 mg) per day. -Weigh yourself daily and record, and bring that "weight diary" to your next appointment.  (Weight gain of 2-3 pounds in 1 day typically means fluid weight.) -The medications for your heart are to help your heart and help you live longer.   -Please contact us before stopping any of your heart medications.  Call the clinic at [phone] with questions or to reschedule future appointments.

## 2021-06-28 NOTE — Progress Notes (Signed)
Remote ICD transmission.   

## 2021-07-09 ENCOUNTER — Other Ambulatory Visit: Payer: Self-pay | Admitting: Internal Medicine

## 2021-07-09 NOTE — Telephone Encounter (Signed)
Please refill as per office routine med refill policy (all routine meds to be refilled for 3 mo or monthly (per pt preference) up to one year from last visit, then month to month grace period for 3 mo, then further med refills will have to be denied) ? ?

## 2021-07-28 ENCOUNTER — Telehealth: Payer: Self-pay | Admitting: Internal Medicine

## 2021-07-28 NOTE — Telephone Encounter (Signed)
I was called by the patient because she has been having multiple days of progressive shortness of breath, low-grade fevers, and coughing with some sputum production.  Vital signs overall.  Stable.  Satting well on blood pressures been normal.  Her shortness of breath has gotten worse over the last day.  I recommended that she go to her local emergency department for evaluation and she stated understanding.  They can contact on-call provider if there is any concern for cardiology etiology.  Billey Chang, MD Cardiology

## 2021-07-29 ENCOUNTER — Encounter: Payer: Self-pay | Admitting: Internal Medicine

## 2021-07-29 ENCOUNTER — Ambulatory Visit (INDEPENDENT_AMBULATORY_CARE_PROVIDER_SITE_OTHER): Payer: Medicare Other

## 2021-07-29 ENCOUNTER — Ambulatory Visit (INDEPENDENT_AMBULATORY_CARE_PROVIDER_SITE_OTHER): Payer: Medicare Other | Admitting: Internal Medicine

## 2021-07-29 VITALS — BP 132/68 | HR 59 | Temp 98.2°F | Resp 18 | Ht 61.0 in | Wt 108.8 lb

## 2021-07-29 DIAGNOSIS — R051 Acute cough: Secondary | ICD-10-CM

## 2021-07-29 DIAGNOSIS — R059 Cough, unspecified: Secondary | ICD-10-CM | POA: Diagnosis not present

## 2021-07-29 DIAGNOSIS — J441 Chronic obstructive pulmonary disease with (acute) exacerbation: Secondary | ICD-10-CM

## 2021-07-29 DIAGNOSIS — R509 Fever, unspecified: Secondary | ICD-10-CM | POA: Diagnosis not present

## 2021-07-29 MED ORDER — DOXYCYCLINE HYCLATE 100 MG PO TABS: 100.0000 mg | ORAL_TABLET | Freq: Two times a day (BID) | ORAL | 0 refills | Status: DC

## 2021-07-29 MED ORDER — PREDNISONE 20 MG PO TABS: 40.0000 mg | ORAL_TABLET | Freq: Every day | ORAL | 0 refills | Status: DC

## 2021-07-29 NOTE — Progress Notes (Signed)
   Subjective:   Patient ID: Kimberly [name], female    DOB: 10-14-1945, 76 y.o.   MRN: [id number]  HPI The patient is a 76 YO female coming in for sick visit.   Review of Systems  Constitutional:  Positive for activity change, appetite change and chills. Negative for fatigue, fever and unexpected weight change.  HENT:  Positive for congestion, postnasal drip, rhinorrhea and sinus pressure. Negative for ear discharge, ear pain, sinus pain, sneezing, sore throat, tinnitus, trouble swallowing and voice change.   Eyes: Negative.   Respiratory:  Positive for cough. Negative for chest tightness, shortness of breath and wheezing.   Cardiovascular: Negative.   Gastrointestinal: Negative.   Musculoskeletal:  Positive for myalgias.  Neurological: Negative.     Objective:  Physical Exam Constitutional:      Appearance: She is well-developed.  HENT:     Head: Normocephalic and atraumatic.     Comments: Oropharynx with redness and clear drainage, nose with swollen turbinates, TMs normal bilaterally.  Neck:     Thyroid: No thyromegaly.  Cardiovascular:     Rate and Rhythm: Normal rate and regular rhythm.  Pulmonary:     Effort: Pulmonary effort is normal. No respiratory distress.     Breath sounds: Wheezing and rhonchi present. No rales.  Abdominal:     Palpations: Abdomen is soft.  Musculoskeletal:        General: Tenderness present.     Cervical back: Normal range of motion.  Lymphadenopathy:     Cervical: No cervical adenopathy.  Skin:    General: Skin is warm and dry.  Neurological:     Mental Status: She is alert and oriented to person, place, and time.     Vitals:   07/29/21 1450  BP: 132/68  Pulse: (!) 59  Resp: 18  Temp: 98.2 F (36.8 C)  TempSrc: Oral  SpO2: 93%  Weight: 108 lb 12.8 oz (49.4 kg)  Height: '5\' 1"'$  (1.549 m)    Assessment & Plan:

## 2021-07-29 NOTE — Patient Instructions (Signed)
Doxycycline to take 1 pill twice a day for 1 week   Predinsone to take 2 pills daily for 5 days.

## 2021-07-29 NOTE — Assessment & Plan Note (Signed)
Suspect exacerbation. Checking CXR. She did not have CXR after recent infection for clearance. Counseled her that this needs to be done and if infection today needs repeat CXR 4-6 weeks. Rx doxycycline and prednisone for flare today. She has stopped breztri and advised her to resume this again. Using albuterol to help as well and can continue. Advised to stop smoking.

## 2021-07-30 ENCOUNTER — Ambulatory Visit: Payer: Medicare Other | Admitting: Family Medicine

## 2021-08-06 ENCOUNTER — Telehealth: Payer: Self-pay | Admitting: Nurse Practitioner

## 2021-08-06 NOTE — Telephone Encounter (Signed)
Spoke with the pt and notified of response per MW. She verbalized understanding. Nothing further needed. Will keep planned appt with APP on 08/08/21.

## 2021-08-06 NOTE — Telephone Encounter (Signed)
Spoke with the pt  She states that she had pna about a month ago  She was treated and feeling better now  She did use her friend's albuterol neb when she was sick and this helped at the time  She is asking if we can prescribe neb machine and albuterol 2.5 mg sol  She is scheduled with APP here 08/08/21  I did confirm that she is using her breztri- she has been only doing 1 puff bid  I advised take 2 puffs every 12 hours and rinse mouth  She is using her albuterol inhaler about 3 x per day and this does help some  Please advise on neb, thanks!

## 2021-08-06 NOTE — Telephone Encounter (Signed)
Let her use the breztri  at Take 2 puffs first thing in am and then another 2 puffs about 12 hours later.    Let Kimberly [name] decide what she needs on 7/20 but bring all present at that ov to regroup

## 2021-08-08 ENCOUNTER — Ambulatory Visit: Payer: Medicare Other | Admitting: Nurse Practitioner

## 2021-08-08 ENCOUNTER — Encounter: Payer: Self-pay | Admitting: Nurse Practitioner

## 2021-08-08 VITALS — BP 102/64 | HR 61 | Temp 98.8°F | Ht 61.0 in | Wt 106.2 lb

## 2021-08-08 DIAGNOSIS — J441 Chronic obstructive pulmonary disease with (acute) exacerbation: Secondary | ICD-10-CM | POA: Diagnosis not present

## 2021-08-08 DIAGNOSIS — F17210 Nicotine dependence, cigarettes, uncomplicated: Secondary | ICD-10-CM

## 2021-08-08 MED ORDER — ALBUTEROL SULFATE (2.5 MG/3ML) 0.083% IN NEBU: 2.5000 mg | INHALATION_SOLUTION | Freq: Four times a day (QID) | RESPIRATORY_TRACT | 5 refills | Status: DC | PRN

## 2021-08-08 MED ORDER — PREDNISONE 10 MG PO TABS: ORAL_TABLET | ORAL | 0 refills | Status: DC

## 2021-08-08 NOTE — Patient Instructions (Addendum)
Continue Albuterol inhaler 2 puffs or 3 mL neb every 6 hours as needed for shortness of breath or wheezing. Notify if symptoms persist despite rescue inhaler/neb use. Continue Breztri 2 puffs Twice daily. Brush tongue and rinse mouth afterwards  Mucinex 600 mg Twice daily for congestion/cough Prednisone taper. 4 tabs for 2 days, then 3 tabs for 2 days, 2 tabs for 2 days, then 1 tab for 2 days, then stop. Take in AM with food   Order sent to medical supply company for nebulizer machine. Should receive in the next day or two.   Keep working towards your goal of quitting smoking. Use Wellbutrin 1 tab daily as prescribed by Dr. Jenny Reichmann. Set a quit date for a month from now and stick to it!   Follow up in one month with Dr. Melvyn Novas or Alanson Aly. If symptoms do not improve or worsen, please contact office for sooner follow up or seek emergency care.

## 2021-08-08 NOTE — Progress Notes (Signed)
$'@Patient'x$  ID: Kimberly [name], female    DOB: 02-20-1945, 76 y.o.   MRN: [id number]  Chief Complaint  Patient presents with   Acute Visit    Pt states last week she had a low grade fever and cough. Called PCP and was seen and given antibiotics and steroids. She states she now feels better.     Referring provider: Biagio Borg, MD  HPI: 76 year old female, active smoker followed for COPD.  She is a patient of Dr. Gustavus Bryant and last seen in office 07/27/2020.  Past medical history significant for PVD, dilated cardiomyopathy with ICD, SVT, CHF, VSD status postrepair x2, hypertension, allergic rhinitis, GERD, cirrhosis, anxiety, depression, HLD.  TEST/EVENTS:  01/12/2017 PFTs: FVC 55, FEV1 37, ratio 54, TLC 93, DLCOunc 47. 05/26/2019 cardiopulmonary stress test: Submaximal, moderate HF limitation but more significant pulmonary limitation 09/26/2020 echocardiogram: EF 35-40 percent.  No definite residual VSD.  G1 DD.  RV function mildly reduced.  Normal PASP.  Trivial MR. 05/12/2021 CTA chest: No evidence of PE.  Mild cardiomegaly.  Atherosclerosis present.  There are scattered tree-in-bud opacities within the right lower lobe, likely infection.  Moderate centrilobular emphysema is present.  Nodular hepatic contour, consistent with cirrhosis. 07/29/2021 CXR 2 view: Both lungs are clear.  Lungs are hyperinflated with mild biapical pleural thickening which is unchanged.  No acute process.  07/27/2020: OV with Dr. Melvyn Novas.  Breathing overall doing well.  Using albuterol around 2 times a day.  Sometimes has a nocturnal cough.  Maintained on Breztri.  Reeducated on albuterol as rescue medicine.  Recommended lung cancer screening CTs to set up through PCP.  Encourage smoking cessation.  08/08/2021: Today - acute Patient presents today for acute visit.  She was recently treated by her PCP for AECOPD with prednisone burst and empiric doxycycline course, which she has completed.  She reported some low grade fevers but chest  x-ray was clear.  She had apparently stopped her Judithann Sauger and was advised to resume use.  Today, she reports that she is feeling better from last week. She is still a little more short of breath when compared to her baseline. Went to [employer] yesterday and had to stop a few times to catch her breath. Her cough is persistent and congested but less productive and sputum is no longer purulent. She has noticed an occasional wheeze. She was using her rescue inhaler multiple times a day last week. Has only had to use it 1-2 times over the past few days. She denies any recurrent fevers, chills, hemoptysis, orthopnea, PND, leg swelling. She is back on [place], feels like it is helping. She is still smoking but is down to 1-4 cigarettes a day. Dr. Jenny Reichmann had started her on Wellbutrin, which is helping her quit.   Allergies  Allergen Reactions   Mupirocin Other (See Comments)    Burning, pain, swelling and sob   Codeine Nausea Only    Immunization History  Administered Date(s) Administered   Influenza Split 10/15/2020   Influenza, High Dose Seasonal PF 09/30/2018   Influenza,inj,Quad PF,6+ Mos 10/17/2015   Influenza,inj,quad, With Preservative 11/17/2016   Influenza-Unspecified 09/20/2013, 10/13/2017, 09/30/2018, 10/13/2019, 10/13/2019   PFIZER(Purple Top)SARS-COV-2 Vaccination 03/13/2019, 04/05/2019   Pneumococcal Conjugate-13 02/20/2010, 11/23/2012   Pneumococcal Polysaccharide-23 10/27/2017   Tdap 02/20/2009    Past Medical History:  Diagnosis Date   AICD (automatic cardioverter/defibrillator) present 03/17/2017   Anxiety    Atrial tachycardia (HCC)    Bursitis of shoulder, right, adhesive    Cancer (  [place])    CHF (congestive heart failure) ([place])    Chronic bronchitis ([place])    "1-2 times/yr" (01/23/2014)   COPD (chronic obstructive pulmonary disease) (HCC)    CVD (cerebrovascular disease)    Dyslipidemia    Dysrhythmia    Frequency of urination    GERD (gastroesophageal reflux disease)     Heart murmur    History of stomach ulcers    HTN (hypertension) 02/22/2011   Migraines    "stopped many years ago" (06/14/2014)   Osteoporosis 08/19/2016   Pericarditis    Pneumonia "10 times" (06/14/2014)   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)    Silent myocardial infarction ([place]) "late 1990's"   Stress incontinence    "was suppose to have been tacked up years ago but I didn't do it"   Syncope, near    Associated with atrial tachycardia-event recorder 1/16   Thoracic outlet syndrome    VSD (ventricular septal defect)     Tobacco History: Social History   Tobacco Use  Smoking Status Every Day   Packs/day: 0.50   Years: 35.00   Total pack years: 17.50   Types: Cigarettes  Smokeless Tobacco Never  Tobacco Comments   3-4 cigarettes daily. Smoked 1971-present.  Verified 08/08/21 LW   Ready to quit: Not Answered Counseling given: Not Answered Tobacco comments: 3-4 cigarettes daily. Smoked 1971-present.  Verified 08/08/21 LW   Outpatient Medications Prior to Visit  Medication Sig Dispense Refill   albuterol (VENTOLIN HFA) 108 (90 Base) MCG/ACT inhaler INHALE 2 PUFFS INTO THE LUNGS EVERY 6 HOURS AS NEEDED FOR WHEEZING OR SHORTNESS OF BREATH 8.5 g 0   ALPRAZolam (XANAX) 0.5 MG tablet TAKE 1 TABLET BY MOUTH TWICE PER DAY AS NEEDED 60 tablet 2   aspirin 81 MG tablet Take 1 tablet (81 mg total) by mouth daily. 100 tablet 99   bisoprolol (ZEBETA) 5 MG tablet Take 0.5 tablets (2.5 mg total) by mouth daily. 45 tablet 3   Budeson-Glycopyrrol-Formoterol (BREZTRI AEROSPHERE) 160-9-4.8 MCG/ACT AERO Inhale 2 puffs into the lungs 2 (two) times daily. 32.1 g 3   buPROPion (WELLBUTRIN XL) 150 MG 24 hr tablet Take 1 tablet (150 mg total) by mouth daily. 90 tablet 3   chlorpheniramine-HYDROcodone (TUSSIONEX PENNKINETIC ER) 10-8 MG/5ML Take 5 mLs by mouth every 12 (twelve) hours as needed for cough. 180 mL 0   digoxin (LANOXIN) 0.125 MG tablet Take 0.5 tablets  (62.5 mcg total) by mouth daily. 45 tablet 3   doxycycline (VIBRA-TABS) 100 MG tablet Take 1 tablet (100 mg total) by mouth 2 (two) times daily. 14 tablet 0   furosemide (LASIX) 20 MG tablet Take 1 tablet (20 mg total) by mouth daily as needed. 90 tablet 1   ibuprofen (ADVIL,MOTRIN) 200 MG tablet Take 200 mg by mouth daily as needed for headache.     rosuvastatin (CRESTOR) 10 MG tablet TAKE 1 TABLET BY MOUTH DAILY 90 tablet 3   sacubitril-valsartan (ENTRESTO) 24-26 MG Take 1 tablet by mouth 2 (two) times daily. 180 tablet 3   spironolactone (ALDACTONE) 25 MG tablet Take 12.5 mg by mouth at bedtime.     tiZANidine (ZANAFLEX) 2 MG tablet Take 1 tablet (2 mg total) by mouth every 6 (six) hours as needed for muscle spasms. 40 tablet 1   predniSONE (DELTASONE) 20 MG tablet Take 2 tablets (40 mg total) by mouth daily with breakfast. 10 tablet 0   No facility-administered medications prior to visit.     Review of  Systems:   Constitutional: No weight loss or gain, night sweats, fevers, chills, fatigue, or lassitude. HEENT: No headaches, difficulty swallowing, tooth/dental problems, or sore throat. No sneezing, itching, ear ache, nasal congestion, or post nasal drip CV:  No chest pain, orthopnea, PND, swelling in lower extremities, anasarca, dizziness, palpitations, syncope Resp: +shortness of breath with exertion; congested, productive cough (improved); occasional wheeze. No hemoptysis.  No chest wall deformity GI:  No heartburn, indigestion, abdominal pain, nausea, vomiting, diarrhea, change in bowel habits, loss of appetite, bloody stools.  Skin: No rash, lesions, ulcerations MSK:  No joint pain or swelling.  No decreased range of motion.  No back pain. Neuro: No dizziness or lightheadedness.  Psych: No depression or anxiety. Mood stable.     Physical Exam:  BP 102/64 (BP Location: Left Arm, Patient Position: Sitting, Cuff Size: Normal)   Pulse 61   Temp 98.8 F (37.1 C) (Oral)   Ht '5\' 1"'$   (1.549 m)   Wt 106 lb 3.2 oz (48.2 kg)   SpO2 96%   BMI 20.07 kg/m   GEN: Pleasant, interactive, well-appearing; in no acute distress. HEENT:  Normocephalic and atraumatic. PERRLA. Sclera white. Nasal turbinates pink, moist and patent bilaterally. No rhinorrhea present. Oropharynx pink and moist, without exudate or edema. No lesions, ulcerations, or postnasal drip.  NECK:  Supple w/ fair ROM. No JVD present. Normal carotid impulses w/o bruits. Thyroid symmetrical with no goiter or nodules palpated. No lymphadenopathy.   CV: RRR, no m/r/g, no peripheral edema. Pulses intact, +2 bilaterally. No cyanosis, pallor or clubbing. PULMONARY:  Unlabored, regular breathing. Diminished bilaterally A&P w/o wheezes/rales/rhonchi. No accessory muscle use. No dullness to percussion. GI: BS present and normoactive. Soft, non-tender to palpation. No organomegaly or masses detected. No CVA tenderness. MSK: No erythema, warmth or tenderness. Cap refil <2 sec all extrem. No deformities or joint swelling noted.  Neuro: A/Ox3. No focal deficits noted.   Skin: Warm, no lesions or rashe Psych: Normal affect and behavior. Judgement and thought content appropriate.     Lab Results:  CBC    Component Value Date/Time   WBC 8.5 05/12/2021 1026   RBC 3.93 05/12/2021 1026   HGB 11.7 (L) 05/12/2021 1026   HGB 13.9 10/06/2016 1418   HCT 36.6 05/12/2021 1026   HCT 42.1 10/06/2016 1418   PLT 291 05/12/2021 1026   PLT 273 10/06/2016 1418   MCV 93.1 05/12/2021 1026   MCV 87 10/06/2016 1418   MCH 29.8 05/12/2021 1026   MCHC 32.0 05/12/2021 1026   RDW 14.2 05/12/2021 1026   RDW 13.8 10/06/2016 1418   LYMPHSABS 1.1 05/12/2021 1026   LYMPHSABS 1.7 01/25/2013 0908   MONOABS 0.9 05/12/2021 1026   EOSABS 0.0 05/12/2021 1026   EOSABS 0.0 01/25/2013 0908   BASOSABS 0.1 05/12/2021 1026   BASOSABS 0.1 01/25/2013 0908    BMET    Component Value Date/Time   NA 138 06/05/2021 1533   NA 140 05/27/2021 1103   K 3.7  06/05/2021 1533   CL 103 06/05/2021 1533   CO2 26 06/05/2021 1533   GLUCOSE 122 (H) 06/05/2021 1533   BUN 17 06/05/2021 1533   BUN 28 (H) 05/27/2021 1103   CREATININE 1.23 (H) 06/05/2021 1533   CREATININE 0.81 11/02/2015 1251   CALCIUM 9.0 06/05/2021 1533   GFRNONAA 46 (L) 06/05/2021 1533   GFRAA >60 08/09/2019 1038    BNP    Component Value Date/Time   BNP 1,085.8 (H) 05/12/2021 1026  Imaging:  DG Chest 2 View  Result Date: 07/30/2021 CLINICAL DATA:  Cough and fever for 3 days. EXAM: CHEST - 2 VIEW COMPARISON:  May 12, 2021 FINDINGS: The heart size and mediastinal contours are stable. Cardiac pacemaker is unchanged. Both lungs are clear. Lungs are hyperinflated. Mild biapical pleural thickening noted unchanged. The visualized skeletal structures are unremarkable. IMPRESSION: No active cardiopulmonary disease. COPD. Electronically Signed   By: Abelardo Diesel M.D.   On: 07/30/2021 10:56         Latest Ref Rng & Units 01/12/2017    9:38 AM  PFT Results  FVC-Pre L 1.46   FVC-Predicted Pre % 55   FVC-Post L 1.64   FVC-Predicted Post % 61   Pre FEV1/FVC % % 52   Post FEV1/FCV % % 54   FEV1-Pre L 0.76   FEV1-Predicted Pre % 37   FEV1-Post L 0.88   DLCO uncorrected ml/min/mmHg 9.68   DLCO UNC% % 47   DLVA Predicted % 78   TLC L 4.30   TLC % Predicted % 93   RV % Predicted % 134     No results found for: "NITRICOXIDE"      Assessment & Plan:   COPD with acute exacerbation (HCC) Slowly resolving AECOPD.  She has improved since being treated with empiric doxycycline and prednisone; residual increased DOE and congested cough. Cough has cleared up and is less productive with clear sputum. Given this and previous CXR was clear without evidence superimposed infection, we will hold off on any further antibiotic therapy.  Recommended we treat her with prednisone taper.  Advised that she use albuterol neb twice daily until symptoms improve.  New machine sent today.   Continue triple therapy with Breztri and as needed albuterol.   Patient Instructions  Continue Albuterol inhaler 2 puffs or 3 mL neb every 6 hours as needed for shortness of breath or wheezing. Notify if symptoms persist despite rescue inhaler/neb use. Continue Breztri 2 puffs Twice daily. Brush tongue and rinse mouth afterwards  Mucinex 600 mg Twice daily for congestion/cough Prednisone taper. 4 tabs for 2 days, then 3 tabs for 2 days, 2 tabs for 2 days, then 1 tab for 2 days, then stop. Take in AM with food   Order sent to medical supply company for nebulizer machine. Should receive in the next day or two.   Keep working towards your goal of quitting smoking. Use Wellbutrin 1 tab daily as prescribed by Dr. Jenny Reichmann. Set a quit date for a month from now and stick to it!   Follow up in one month with Dr. Melvyn Novas or Alanson Aly. If symptoms do not improve or worsen, please contact office for sooner follow up or seek emergency care.     Cigarette smoker Current every day smoker. She is wanting to quit. Using Wellbutrin prescribed by PCP. We reviewed methods to helping her quit and staying smoke free. CT chest from April without any suspicious nodules. She will need referral to lung cancer screening program at follow up.   The patient's current tobacco use: 1/4 ppd max The patient was advised to quit and impact of smoking on their health.  I assessed the patient's willingness to attempt to quit. I provided methods and skills for cessation. We reviewed medication management of smoking session drugs if appropriate. Resources to help quit smoking were provided. A smoking cessation quit date was set: September 08, 2021 Follow-up was arranged in our clinic.  The amount of time spent  counseling patient was 4 mins    I spent 32 minutes of dedicated to the care of this patient on the date of this encounter to include pre-visit review of records, face-to-face time with the patient discussing conditions  above, post visit ordering of testing, clinical documentation with the electronic health record, making appropriate referrals as documented, and communicating necessary findings to members of the patients care team.  Clayton Bibles, NP 08/08/2021  Pt aware and understands NP's role.

## 2021-08-08 NOTE — Assessment & Plan Note (Addendum)
Current every day smoker. She is wanting to quit. Using Wellbutrin prescribed by PCP. We reviewed methods to helping her quit and staying smoke free. CT chest from April without any suspicious nodules. She will need referral to lung cancer screening program at follow up.   The patient's current tobacco use: 1/4 ppd max The patient was advised to quit and impact of smoking on their health.  I assessed the patient's willingness to attempt to quit. I provided methods and skills for cessation. We reviewed medication management of smoking session drugs if appropriate. Resources to help quit smoking were provided. A smoking cessation quit date was set: September 08, 2021 Follow-up was arranged in our clinic.  The amount of time spent counseling patient was 4 mins

## 2021-08-08 NOTE — Assessment & Plan Note (Signed)
Slowly resolving AECOPD.  She has improved since being treated with empiric doxycycline and prednisone; residual increased DOE and congested cough. Cough has cleared up and is less productive with clear sputum. Given this and previous CXR was clear without evidence superimposed infection, we will hold off on any further antibiotic therapy.  Recommended we treat her with prednisone taper.  Advised that she use albuterol neb twice daily until symptoms improve.  New machine sent today.  Continue triple therapy with Breztri and as needed albuterol.   Patient Instructions  Continue Albuterol inhaler 2 puffs or 3 mL neb every 6 hours as needed for shortness of breath or wheezing. Notify if symptoms persist despite rescue inhaler/neb use. Continue Breztri 2 puffs Twice daily. Brush tongue and rinse mouth afterwards  Mucinex 600 mg Twice daily for congestion/cough Prednisone taper. 4 tabs for 2 days, then 3 tabs for 2 days, 2 tabs for 2 days, then 1 tab for 2 days, then stop. Take in AM with food   Order sent to medical supply company for nebulizer machine. Should receive in the next day or two.   Keep working towards your goal of quitting smoking. Use Wellbutrin 1 tab daily as prescribed by Dr. Jenny Reichmann. Set a quit date for a month from now and stick to it!   Follow up in one month with Dr. Melvyn Novas or Alanson Aly. If symptoms do not improve or worsen, please contact office for sooner follow up or seek emergency care.

## 2021-08-25 ENCOUNTER — Other Ambulatory Visit: Payer: Self-pay | Admitting: Internal Medicine

## 2021-08-26 MED ORDER — ALBUTEROL SULFATE HFA 108 (90 BASE) MCG/ACT IN AERS: 2.0000 | INHALATION_SPRAY | Freq: Four times a day (QID) | RESPIRATORY_TRACT | 0 refills | Status: DC | PRN

## 2021-08-27 ENCOUNTER — Ambulatory Visit: Payer: Self-pay | Admitting: Licensed Clinical Social Worker

## 2021-08-27 NOTE — Patient Outreach (Signed)
  Care Coordination   Initial Visit Note   08/27/2021 Name: DARA BEIDLEMAN MRN: [id number] DOB: 23-Oct-1945  MARIYAM REMINGTON is a 76 y.o. year old female who sees Biagio Borg, MD for primary care. I spoke with  [name] by phone today  What matters to the patients health and wellness today?  Would be interested in talking with RN or LCSW in future about caregiver stress management     Goals Addressed               This Visit's Progress     patient is interested in talking more with RN or LCSW about managing caregiver stress issues (pt-stated)        Care Coordination Interventions:   Depression screen reviewed  Active listening / Reflection utilized  Emotional Support Provided  Informed client of services of Care Coordination program        SDOH assessments and interventions completed:  Yes  SDOH Interventions Today    Flowsheet Row Most Recent Value  SDOH Interventions   Stress Interventions Other (Comment)  [caregiver stress issues]  Depression Interventions/Treatment  Medication        Care Coordination Interventions Activated:  Yes  Care Coordination Interventions:  Yes, provided  0 Follow up plan: Follow up call scheduled for LCSW and client on 09/25/21 at 2:00 PM     Encounter Outcome:  Pt. Visit Completed

## 2021-08-27 NOTE — Patient Instructions (Signed)
Visit Information  Thank you for taking time to visit with me today. Please don't hesitate to contact me if I can be of assistance to you before our next scheduled telephone appointment.  Following are the goals we discussed today:   Our next appointment is by telephone on 09/25/21 at 2:00 PM   Please call the care guide team at [phone] if you need to cancel or reschedule your appointment.   If you are experiencing a Mental Health or [place] or need someone to talk to, please go to [facility] [place] [phone])   Following is a copy of your full plan of care:   Care Coordination Interventions:  Depression screen reviewed  Active listening / Reflection utilized  Emotional Support Provided  Informed client of services of Care Coordination program   Ms. Gatling was given information about Care Management services by the embedded care coordination team including:  Care Management services include personalized support from designated clinical staff supervised by her physician, including individualized plan of care and coordination with other care providers 24/7 contact phone numbers for assistance for urgent and routine care needs. The patient may stop CCM services at any time (effective at the end of the month) by phone call to the office staff.  Patient agreed to services and verbal consent obtained.   Norva Riffle.Kirkland Figg MSW, [place] representative [facility] Care Management [phone]

## 2021-09-03 ENCOUNTER — Encounter: Payer: Self-pay | Admitting: Internal Medicine

## 2021-09-04 MED ORDER — ALPRAZOLAM 0.5 MG PO TABS
ORAL_TABLET | ORAL | 2 refills | Status: DC
Start: 2021-09-04 — End: 2022-03-06

## 2021-09-06 ENCOUNTER — Encounter (HOSPITAL_COMMUNITY): Payer: Self-pay | Admitting: Cardiology

## 2021-09-06 ENCOUNTER — Ambulatory Visit (HOSPITAL_COMMUNITY)
Admission: RE | Admit: 2021-09-06 | Discharge: 2021-09-06 | Disposition: A | Discharge status: Home / Self Care | Payer: Medicare Other | Source: Ambulatory Visit | Attending: Cardiology | Admitting: Cardiology

## 2021-09-06 VITALS — BP 90/50 | HR 58 | Wt 108.8 lb

## 2021-09-06 DIAGNOSIS — I429 Cardiomyopathy, unspecified: Secondary | ICD-10-CM | POA: Insufficient documentation

## 2021-09-06 DIAGNOSIS — I5022 Chronic systolic (congestive) heart failure: Secondary | ICD-10-CM | POA: Insufficient documentation

## 2021-09-06 DIAGNOSIS — I739 Peripheral vascular disease, unspecified: Secondary | ICD-10-CM | POA: Insufficient documentation

## 2021-09-06 DIAGNOSIS — Z8774 Personal history of (corrected) congenital malformations of heart and circulatory system: Secondary | ICD-10-CM | POA: Diagnosis not present

## 2021-09-06 DIAGNOSIS — Z79899 Other long term (current) drug therapy: Secondary | ICD-10-CM | POA: Insufficient documentation

## 2021-09-06 DIAGNOSIS — I2510 Atherosclerotic heart disease of native coronary artery without angina pectoris: Secondary | ICD-10-CM | POA: Diagnosis not present

## 2021-09-06 DIAGNOSIS — J449 Chronic obstructive pulmonary disease, unspecified: Secondary | ICD-10-CM | POA: Diagnosis not present

## 2021-09-06 DIAGNOSIS — I110 Hypertensive heart disease with heart failure: Secondary | ICD-10-CM | POA: Insufficient documentation

## 2021-09-06 DIAGNOSIS — I428 Other cardiomyopathies: Secondary | ICD-10-CM | POA: Diagnosis not present

## 2021-09-06 DIAGNOSIS — R0789 Other chest pain: Secondary | ICD-10-CM | POA: Diagnosis not present

## 2021-09-06 DIAGNOSIS — F17200 Nicotine dependence, unspecified, uncomplicated: Secondary | ICD-10-CM | POA: Insufficient documentation

## 2021-09-06 DIAGNOSIS — E785 Hyperlipidemia, unspecified: Secondary | ICD-10-CM | POA: Diagnosis not present

## 2021-09-06 DIAGNOSIS — Z7982 Long term (current) use of aspirin: Secondary | ICD-10-CM | POA: Diagnosis not present

## 2021-09-06 LAB — LIPID PANEL
Cholesterol: 169 mg/dL (ref 0–200)
HDL: 91 mg/dL (ref >40)
LDL Cholesterol: 68 mg/dL (ref 0–99)
Total CHOL/HDL Ratio: 1.9 RATIO
Triglycerides: 49 mg/dL (ref <150)
VLDL: 10 mg/dL (ref 0–40)

## 2021-09-06 LAB — BASIC METABOLIC PANEL
Anion gap: 7 (ref 5–15)
BUN: 12 mg/dL (ref 8–23)
CO2: 26 mmol/L (ref 22–32)
Calcium: 9.1 mg/dL (ref 8.9–10.3)
Chloride: 106 mmol/L (ref 98–111)
Creatinine, Ser: 0.91 mg/dL (ref 0.44–1.00)
GFR, Estimated: >60 mL/min (ref >60)
Glucose, Bld: 84 mg/dL (ref 70–99)
Potassium: 4.4 mmol/L (ref 3.5–5.1)
Sodium: 139 mmol/L (ref 135–145)

## 2021-09-06 LAB — BRAIN NATRIURETIC PEPTIDE: B Natriuretic Peptide: 222.4 pg/mL — ABNORMAL HIGH (ref 0.0–100.0)

## 2021-09-06 LAB — DIGOXIN LEVEL: Digoxin Level: 0.7 ng/mL — ABNORMAL LOW (ref 0.8–2.0)

## 2021-09-06 NOTE — Patient Instructions (Addendum)
Try the '14mg'$  nicotine patch   Take lasix 20 mg daily for 2 days, then back to as needed.  Take spironolactone and Bisoprolol nightly   Labs done today, your results will be available in MyChart, we will contact you for abnormal readings.  Your physician has requested that you have an echocardiogram. Echocardiography is a painless test that uses sound waves to create images of your heart. It provides your doctor with information about the size and shape of your heart and how well your heart's chambers and valves are working. This procedure takes approximately one hour. There are no restrictions for this procedure.  Your physician recommends that you schedule a follow-up appointment in: 3 months   If you have any questions or concerns before your next appointment please send [place] a message through [place] or call our office at [phone].    TO LEAVE A MESSAGE FOR THE NURSE SELECT OPTION 2, PLEASE LEAVE A MESSAGE INCLUDING: YOUR NAME DATE OF BIRTH CALL BACK NUMBER REASON FOR CALL**this is important as we prioritize the call backs  YOU WILL RECEIVE A CALL BACK THE SAME DAY AS LONG AS YOU CALL BEFORE 4:00 PM  At the [facility], you and your health needs are our priority. As part of our continuing mission to provide you with exceptional heart care, we have created designated Provider Care Teams. These Care Teams include your primary Cardiologist (physician) and Advanced Practice Providers (APPs- Physician Assistants and Nurse Practitioners) who all work together to provide you with the care you need, when you need it.   You may see any of the following providers on your designated Care Team at your next follow up: Dr Glori Bickers Dr Haynes Kerns, NP Lyda Jester, [place] [facility] [place], [place] Audry Riles, PharmD   Please be sure to bring in all your medications bottles to every appointment.

## 2021-09-08 NOTE — Progress Notes (Signed)
PCP: Dr. Jenny Reichmann Cardiology: Dr. Harrington Challenger HF Cardiology: Dr. Aundra Dubin Cardiology/Vascular: Dr Fletcher Anon  76 y.o.with history of VSD repair as a child, COPD/active smoking, PAD, and chronic systolic CHF.  Patient has a long history of cardiomyopathy.  She had VSD repaired as a child and imaging has not showed residual VSD.  Cardiac MRI in 11/09 showed EF 36%.  Echo in 12/15 showed EF 40-45%.  Echo in 3/18 showed EF down to 15-20% with moderate RV systolic dysfunction.  RHC/LHC in 10/18 showed nonobstructive coronary but was concerning for low output (CI 1.33).  She had a nonhealing right lower extremity wound.  Peripheral angiogram with bilateral CFA occlusions and underwent DCB/self expanding stent to to right CFA.    CPX in 1/19 showed moderate to severe functional limitation, combination of HF and lung disease, probably more related to the lung disease.  PFTs in 12/18 showed severe COPD.   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    7/19 peripheral arterial dopplers showed significant in-stent restenosis right CFA stent.  Echo in 4/21 showed EF 35-40% with mild AI.  CPX in 5/21 showed severe COPD, mild-moderate HF limitation.  11/21 Cardiolite showed prior septal and apical infarction, no ischemia.   Had COVID April 2022. She has continued to recover.   Echo in 9/22 showed EF 35-40% with basal-mid septal akinesis and anterior hypokinesis, no evidence for residual VSD, mildly decreased RV systolic function.   She returns for followup of CHF. She is still smoking.  Weight down 1 lb.  Occasional lightheadedness with standing, no falls.  She has unchanged dyspnea with moderate exertion.  No problems walking into the office this morning.  Occasional atypical chest pain.  No orthopnea/PND. Under a lot of stress and anxious about her husband's health.     Labs (12/22): BNP 418, digoxin 0.5, K 5, creatinine 1.17 Labs (8/23): BNP 222, digoxin 0.7, LDL 68, K 4.4, creatinine 0.91  PMH: 1. VSD: s/p  repair at [facility] as child.  From description, sounds like muscular VSD.  2. Atrial tachycardia s/p ablation.  3. Hyperlipidemia 4. COPD: Active smoker.  - PFTs (12/18): Severe obstructive lung disease.  5. Atrial fibrillation: Paroxysmal.   Noted only transiently.   6. PAD:  - Angiogram 11/18 showed totally occluded bilateral CFAs.  Patient had stent to right CFA.  ABIs in 12/18 were normal on right. - ABIs (10/20): ABI 0.8 right, 0.74 left - 12/21 dopplers with R CFA stent in-stenosis.  7. Chronic systolic CHF: Nonischemic cardiomyopathy.   - cMRI (11/09): EF 36%, VSD patch in anterior ventricular septum - Echo (12/15): EF 40-45% - Echo (3/18): EF 15-20%, moderate LV dilation, moderate MR, moderate RV dilation with moderately decreased systolic function.  - LHC/RHC (10/18): 3+ MR, EF < 25%, minimal nonobstructive CAD; PA 41/21, LVEDP 18, CI 1.33.  - CPX (1/19): peak VO2 13.4, VE/VCO2 slope 43, RER 1.07.  Mod-severe functional limitation due to combination of HF and lung disease, probably more related to lung disease.  - [name] CRT-D device.  - Echo (7/19): EF 25-30%, mild LV dilation, mild MR, normal RV size and systolic function.  - Echo (4/21): EF 35-40%, mild AI - CPX (5/21): severe COPD, mild-moderate HF limitation.  - Cardiolite (11/21): EF 47%, prior septal and apical infarction, no ischemia. - Echo (9/22): EF 35-40% with basal-mid septal akinesis and anterior hypokinesis, no evidence for residual VSD, mildly decreased RV systolic function.  8. PVCs: Zio patch 9/19 with 6% PVCs.  9.  COVID-19 (4/22)  SH: Smoker, lives in [place], married.  FH: Mother with PAD, sister died at birth from congenital heart disease.   ROS: All systems reviewed and negative except as per HPI.   Current Outpatient Medications  Medication Sig Dispense Refill   albuterol (PROVENTIL) (2.5 MG/3ML) 0.083% nebulizer solution Take 3 mLs (2.5 mg total) by nebulization every 6 (six) hours as needed  for wheezing or shortness of breath. 75 mL 5   albuterol (VENTOLIN HFA) 108 (90 Base) MCG/ACT inhaler Inhale 2 puffs into the lungs every 6 (six) hours as needed for wheezing or shortness of breath. 8.5 g 0   ALPRAZolam (XANAX) 0.5 MG tablet 1 tab by mouth twice per day as needed 60 tablet 2   aspirin 81 MG tablet Take 1 tablet (81 mg total) by mouth daily. 100 tablet 99   bisoprolol (ZEBETA) 5 MG tablet Take 0.5 tablets (2.5 mg total) by mouth daily. 45 tablet 3   Budeson-Glycopyrrol-Formoterol (BREZTRI AEROSPHERE) 160-9-4.8 MCG/ACT AERO Inhale 2 puffs into the lungs 2 (two) times daily. 32.1 g 3   chlorpheniramine-HYDROcodone (TUSSIONEX PENNKINETIC ER) 10-8 MG/5ML Take 5 mLs by mouth every 12 (twelve) hours as needed for cough. 180 mL 0   digoxin (LANOXIN) 0.125 MG tablet Take 0.5 tablets (62.5 mcg total) by mouth daily. 45 tablet 3   furosemide (LASIX) 20 MG tablet Take 1 tablet (20 mg total) by mouth daily as needed. 90 tablet 1   ibuprofen (ADVIL,MOTRIN) 200 MG tablet Take 200 mg by mouth daily as needed for headache.     rosuvastatin (CRESTOR) 10 MG tablet TAKE 1 TABLET BY MOUTH DAILY 90 tablet 3   sacubitril-valsartan (ENTRESTO) 24-26 MG Take 0.5 tablets by mouth 2 (two) times daily.     spironolactone (ALDACTONE) 25 MG tablet Take 12.5 mg by mouth at bedtime.     tiZANidine (ZANAFLEX) 2 MG tablet Take 1 tablet (2 mg total) by mouth every 6 (six) hours as needed for muscle spasms. 40 tablet 1   No current facility-administered medications for this encounter.   BP (!) 90/50   Pulse (!) 58   Wt 49.4 kg (108 lb 12.8 oz)   SpO2 95%   BMI 20.56 kg/m   Wt Readings from Last 3 Encounters:  09/06/21 49.4 kg (108 lb 12.8 oz)  08/08/21 48.2 kg (106 lb 3.2 oz)  07/29/21 49.4 kg (108 lb 12.8 oz)   General: NAD Neck: No JVD, no thyromegaly or thyroid nodule.  Lungs: Distant BS CV: Nondisplaced PMI.  Heart sounds somewhat distant, regular S1/S2, no S3/S4, no murmur.  No peripheral edema.  No  carotid bruit.  Difficult to palpate pedal pulses.  Abdomen: Soft, nontender, no hepatosplenomegaly, no distention.  Skin: Intact without lesions or rashes.  Neurologic: Alert and oriented x 3.  Psych: Normal affect. Extremities: No clubbing or cyanosis.  HEENT: Normal.   Assessment/Plan: 1.  PAD: She had endovascular intervention to occluded right CFA in 11/18, but subsequent arterial showed in-stent restenosis. She has occluded left CFA also that was not intervened on. Minimal claudication and no wounds/rest pain. She is still smoking. - Medical management in absence significant symptoms.  - Continue statin.  - Discussed smoking cessation, she is under a lot stress with husband's illness and not ready to quit.  - Followed by Dr Fletcher Anon.   2. CAD: Nonobstructive on 10/18 cath. She has atypical chest pain at times with stress.  Symptoms are very brief and do not appear to be consistent  with angina.  Cardiolite in 11/21 with no evidence of ischemia.  - She is on ASA 81 and statin.  3. Hyperlipidemia: Continue crestor, good lipids today.  4. COPD: Severe by PFTs on 5/21 CPX. COPD plays a significant role in her dyspnea. She is still smoking, not ready to quit (see above).   5. Chronic systolic CHF: Nonischemic cardiomyopathy.  RHC/LHC in 10/18 with no nonobstructive coronary disease but CI was very low at 1.33.  Her low cardiac output was out of proportion to her symptoms.  [name] CRT-D device.  CPX in 5/21 showed severe COPD but only mild-moderate HF limitation.   Echo with EF stable at 35-40% in 9/22.  Weight stable, not volume overloaded on exam. NYHA class III symptoms likely more to do with COPD than CHF.     - Takes Lasix prn.   - Continue Entresto 24/26 bid, no BP room to increase.   - Continue bisoprolol 5 mg daily, no BP room to increase. Take qhs.  - Continue spironolactone 12.5 mg daily. Unable to tolerate higher doses.Take qhs.  - Continue digoxin, level ok today.  - Unable  to tolerate Farxiga.  - Echo at followup in 3 months.   Followup 3 months with echo.   Loralie Champagne 09/08/2021

## 2021-09-09 ENCOUNTER — Ambulatory Visit: Payer: Medicare Other | Admitting: Nurse Practitioner

## 2021-09-16 ENCOUNTER — Ambulatory Visit (INDEPENDENT_AMBULATORY_CARE_PROVIDER_SITE_OTHER): Payer: Medicare Other

## 2021-09-16 ENCOUNTER — Encounter: Payer: Self-pay | Admitting: Nurse Practitioner

## 2021-09-16 ENCOUNTER — Ambulatory Visit: Payer: Medicare Other | Admitting: Nurse Practitioner

## 2021-09-16 VITALS — BP 100/62 | HR 59 | Temp 97.7°F | Ht 61.0 in | Wt 110.2 lb

## 2021-09-16 DIAGNOSIS — F17210 Nicotine dependence, cigarettes, uncomplicated: Secondary | ICD-10-CM | POA: Diagnosis not present

## 2021-09-16 DIAGNOSIS — J449 Chronic obstructive pulmonary disease, unspecified: Secondary | ICD-10-CM

## 2021-09-16 DIAGNOSIS — I5022 Chronic systolic (congestive) heart failure: Secondary | ICD-10-CM

## 2021-09-16 MED ORDER — NICOTINE 14 MG/24HR TD PT24: 14.0000 mg | MEDICATED_PATCH | Freq: Every day | TRANSDERMAL | 0 refills | Status: AC

## 2021-09-16 MED ORDER — NICOTINE 7 MG/24HR TD PT24: 7.0000 mg | MEDICATED_PATCH | Freq: Every day | TRANSDERMAL | 0 refills | Status: DC

## 2021-09-16 MED ORDER — ALBUTEROL SULFATE HFA 108 (90 BASE) MCG/ACT IN AERS: 2.0000 | INHALATION_SPRAY | Freq: Four times a day (QID) | RESPIRATORY_TRACT | 3 refills | Status: DC | PRN

## 2021-09-16 NOTE — Patient Instructions (Addendum)
Continue Albuterol inhaler 2 puffs or 3 mL neb every 6 hours as needed for shortness of breath or wheezing. Notify if symptoms persist despite rescue inhaler/neb use. Continue Breztri 2 puffs Twice daily. Brush tongue and rinse mouth afterwards Continue Mucinex 600 mg Twice daily for congestion/cough  Nicotine patches - apply 14 mg patch daily for 6 weeks then decrease to 7 mg patch for 2 weeks.  Goal quit date: 10/17/2021   Follow up in three months with Dr. Melvyn Novas or Alanson Aly. If symptoms do not improve or worsen, please contact office for sooner follow up or seek emergency care.

## 2021-09-16 NOTE — Progress Notes (Signed)
$'@Patient'P$  ID: Kimberly [name], female    DOB: 08/27/1945, 76 y.o.   MRN: [id number]  Chief Complaint  Patient presents with   Follow-up    She is having some shortness of breath for a few days. She was about stopped smoking.     Referring provider: Biagio Borg, MD  HPI: 76 year old female, active smoker followed for COPD.  She is a patient of Dr. Gustavus Bryant and last seen in office 08/08/2021 by [facility] NP.  Past medical history significant for PVD, dilated cardiomyopathy with ICD, SVT, CHF, VSD status postrepair x2, hypertension, allergic rhinitis, GERD, cirrhosis, anxiety, depression, HLD.  TEST/EVENTS:  01/12/2017 PFTs: FVC 55, FEV1 37, ratio 54, TLC 93, DLCOunc 47. 05/26/2019 cardiopulmonary stress test: Submaximal, moderate HF limitation but more significant pulmonary limitation 09/26/2020 echocardiogram: EF 35-40 percent.  No definite residual VSD.  G1 DD.  RV function mildly reduced.  Normal PASP.  Trivial MR. 05/12/2021 CTA chest: No evidence of PE.  Mild cardiomegaly.  Atherosclerosis present.  There are scattered tree-in-bud opacities within the right lower lobe, likely infection.  Moderate centrilobular emphysema is present.  Nodular hepatic contour, consistent with cirrhosis. 07/29/2021 CXR 2 view: Both lungs are clear.  Lungs are hyperinflated with mild biapical pleural thickening which is unchanged.  No acute process.  07/27/2020: OV with Dr. Melvyn Novas.  Breathing overall doing well.  Using albuterol around 2 times a day.  Sometimes has a nocturnal cough.  Maintained on Breztri.  Reeducated on albuterol as rescue medicine.  Recommended lung cancer screening CTs to set up through PCP.  Encourage smoking cessation.  08/08/2021: OV with Kris No NP for acute visit.  She was recently treated by her PCP for AECOPD with prednisone burst and empiric doxycycline course, which she has completed.  She reported some low grade fevers but chest x-ray was clear.  She had apparently stopped her Judithann Sauger and was advised to  resume use.  Today, she reports that she is feeling better from last week. She is still a little more short of breath when compared to her baseline. Went to [employer] yesterday and had to stop a few times to catch her breath. Her cough is persistent and congested but less productive and sputum is no longer purulent. She has noticed an occasional wheeze. She was using her rescue inhaler multiple times a day last week. Has only had to use it 1-2 times over the past few days. She denies any recurrent fevers, chills, hemoptysis, orthopnea, PND, leg swelling. She is back on [place], feels like it is helping. She is still smoking but is down to 1-4 cigarettes a day. Dr. Jenny Reichmann had started her on Wellbutrin, which is helping her quit. Extended treatment for slow to resolve AECOPD with prednisone taper. Rx sent for her to have nebulizer available.   09/16/2021: Today - follow up Patient presents today for follow-up after being treated for persistent AECOPD.  She reports that her breathing has been doing better since last time she was here.  Feels like she recovered well.  Still has some occasional shortness of breath with exertion, but this is back to her baseline.  She also has a daily, congested cough which is normal for her.  This had gotten a little bit better when she had cut back on smoking.  Unfortunately, her husband had some health issues and difficulties recovering after a recent back surgery.  This is caused a significant amount of stress on her so she had started smoking more again.  She is currently back down to close to 7-8 cigarettes a day, maybe less on some days. She wants to quit. Doesn't feel like the wellbutrin helped so she has stopped this. She denies any increased wheezing, hemoptysis, fevers, anorexia, weight loss, leg swelling, orthopnea. She continues on [place] Twice daily. She uses her nebulizer twice a day, prior to her [place]. Uses her rescue inhaler 1-2 times a week.   Allergies  Allergen  Reactions   Mupirocin Shortness Of Breath and Other (See Comments)    Burning, pain, swelling and sob   Codeine Nausea Only    Immunization History  Administered Date(s) Administered   Influenza Split 10/15/2020   Influenza, High Dose Seasonal PF 09/30/2018   Influenza,inj,Quad PF,6+ Mos 10/17/2015   Influenza,inj,quad, With Preservative 11/17/2016   Influenza-Unspecified 09/20/2013, 10/13/2017, 09/30/2018, 10/13/2019, 10/13/2019   PFIZER(Purple Top)SARS-COV-2 Vaccination 03/13/2019, 04/05/2019   Pneumococcal Conjugate-13 02/20/2010, 11/23/2012   Pneumococcal Polysaccharide-23 10/27/2017   Tdap 02/20/2009    Past Medical History:  Diagnosis Date   AICD (automatic cardioverter/defibrillator) present 03/17/2017   Anxiety    Atrial tachycardia (HCC)    Bursitis of shoulder, right, adhesive    Cancer ([place])    CHF (congestive heart failure) ([place])    Chronic bronchitis ([place])    "1-2 times/yr" (01/23/2014)   COPD (chronic obstructive pulmonary disease) (HCC)    CVD (cerebrovascular disease)    Dyslipidemia    Dysrhythmia    Frequency of urination    GERD (gastroesophageal reflux disease)    Heart murmur    History of stomach ulcers    HTN (hypertension) 02/22/2011   Migraines    "stopped many years ago" (06/14/2014)   Osteoporosis 08/19/2016   Pericarditis    Pneumonia "10 times" (06/14/2014)   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)    Silent myocardial infarction ([place]) "late 1990's"   Stress incontinence    "was suppose to have been tacked up years ago but I didn't do it"   Syncope, near    Associated with atrial tachycardia-event recorder 1/16   Thoracic outlet syndrome    VSD (ventricular septal defect)     Tobacco History: Social History   Tobacco Use  Smoking Status Every Day   Packs/day: 0.50   Years: 35.00   Total pack years: 17.50   Types: Cigarettes  Smokeless Tobacco Never  Tobacco Comments   3-4 cigarettes daily.  Trying to quit. HS   Ready to quit: Not Answered Counseling given: Not Answered Tobacco comments: 3-4 cigarettes daily. Trying to quit. HS   Outpatient Medications Prior to Visit  Medication Sig Dispense Refill   albuterol (PROVENTIL) (2.5 MG/3ML) 0.083% nebulizer solution Take 3 mLs (2.5 mg total) by nebulization every 6 (six) hours as needed for wheezing or shortness of breath. 75 mL 5   ALPRAZolam (XANAX) 0.5 MG tablet 1 tab by mouth twice per day as needed 60 tablet 2   aspirin 81 MG tablet Take 1 tablet (81 mg total) by mouth daily. 100 tablet 99   bisoprolol (ZEBETA) 5 MG tablet Take 0.5 tablets (2.5 mg total) by mouth daily. 45 tablet 3   Budeson-Glycopyrrol-Formoterol (BREZTRI AEROSPHERE) 160-9-4.8 MCG/ACT AERO Inhale 2 puffs into the lungs 2 (two) times daily. 32.1 g 3   digoxin (LANOXIN) 0.125 MG tablet Take 0.5 tablets (62.5 mcg total) by mouth daily. 45 tablet 3   furosemide (LASIX) 20 MG tablet Take 1 tablet (20 mg total) by mouth daily as needed. 90 tablet 1  ibuprofen (ADVIL,MOTRIN) 200 MG tablet Take 200 mg by mouth daily as needed for headache.     rosuvastatin (CRESTOR) 10 MG tablet TAKE 1 TABLET BY MOUTH DAILY 90 tablet 3   sacubitril-valsartan (ENTRESTO) 24-26 MG Take 0.5 tablets by mouth 2 (two) times daily.     spironolactone (ALDACTONE) 25 MG tablet Take 12.5 mg by mouth at bedtime.     tiZANidine (ZANAFLEX) 2 MG tablet Take 1 tablet (2 mg total) by mouth every 6 (six) hours as needed for muscle spasms. 40 tablet 1   albuterol (VENTOLIN HFA) 108 (90 Base) MCG/ACT inhaler Inhale 2 puffs into the lungs every 6 (six) hours as needed for wheezing or shortness of breath. 8.5 g 0   chlorpheniramine-HYDROcodone (TUSSIONEX PENNKINETIC ER) 10-8 MG/5ML Take 5 mLs by mouth every 12 (twelve) hours as needed for cough. (Patient not taking: Reported on 09/16/2021) 180 mL 0   No facility-administered medications prior to visit.     Review of Systems:   Constitutional: No weight  loss or gain, night sweats, fevers, chills, fatigue, or lassitude. HEENT: No headaches, difficulty swallowing, tooth/dental problems, or sore throat. No sneezing, itching, ear ache, nasal congestion, or post nasal drip CV:  No chest pain, orthopnea, PND, swelling in lower extremities, anasarca, dizziness, palpitations, syncope Resp: +shortness of breath with exertion (baseline); congested cough (baseline); occasional wheeze. No hemoptysis.  No chest wall deformity GI:  No heartburn, indigestion, abdominal pain, nausea, vomiting, diarrhea, change in bowel habits, loss of appetite, bloody stools.  Skin: No rash, lesions, ulcerations MSK:  No joint pain or swelling.  No decreased range of motion.  No back pain. Neuro: No dizziness or lightheadedness.  Psych: No depression or anxiety. Mood stable.     Physical Exam:  BP 100/62 (BP Location: Left Arm, Cuff Size: Normal)   Pulse (!) 59   Temp 97.7 F (36.5 C)   Ht '5\' 1"'$  (1.549 m)   Wt 110 lb 3.2 oz (50 kg)   SpO2 97%   BMI 20.82 kg/m   GEN: Pleasant, interactive, well-appearing; in no acute distress. HEENT:  Normocephalic and atraumatic. PERRLA. Sclera white. Nasal turbinates pink, moist and patent bilaterally. No rhinorrhea present. Oropharynx pink and moist, without exudate or edema. No lesions, ulcerations, or postnasal drip.  NECK:  Supple w/ fair ROM. No JVD present. Normal carotid impulses w/o bruits. Thyroid symmetrical with no goiter or nodules palpated. No lymphadenopathy.   CV: RRR, no m/r/g, no peripheral edema. Pulses intact, +2 bilaterally. No cyanosis, pallor or clubbing. PULMONARY:  Unlabored, regular breathing. Diminished bilaterally A&P w/o wheezes/rales/rhonchi. No accessory muscle use. No dullness to percussion. GI: BS present and normoactive. Soft, non-tender to palpation. No organomegaly or masses detected. No CVA tenderness. MSK: No erythema, warmth or tenderness. Cap refil <2 sec all extrem. No deformities or joint  swelling noted.  Neuro: A/Ox3. No focal deficits noted.   Skin: Warm, no lesions or rashe Psych: Normal affect and behavior. Judgement and thought content appropriate.     Lab Results:  CBC    Component Value Date/Time   WBC 8.5 05/12/2021 1026   RBC 3.93 05/12/2021 1026   HGB 11.7 (L) 05/12/2021 1026   HGB 13.9 10/06/2016 1418   HCT 36.6 05/12/2021 1026   HCT 42.1 10/06/2016 1418   PLT 291 05/12/2021 1026   PLT 273 10/06/2016 1418   MCV 93.1 05/12/2021 1026   MCV 87 10/06/2016 1418   MCH 29.8 05/12/2021 1026   MCHC 32.0 05/12/2021 1026  RDW 14.2 05/12/2021 1026   RDW 13.8 10/06/2016 1418   LYMPHSABS 1.1 05/12/2021 1026   LYMPHSABS 1.7 01/25/2013 0908   MONOABS 0.9 05/12/2021 1026   EOSABS 0.0 05/12/2021 1026   EOSABS 0.0 01/25/2013 0908   BASOSABS 0.1 05/12/2021 1026   BASOSABS 0.1 01/25/2013 0908    BMET    Component Value Date/Time   NA 139 09/06/2021 1102   NA 140 05/27/2021 1103   K 4.4 09/06/2021 1102   CL 106 09/06/2021 1102   CO2 26 09/06/2021 1102   GLUCOSE 84 09/06/2021 1102   BUN 12 09/06/2021 1102   BUN 28 (H) 05/27/2021 1103   CREATININE 0.91 09/06/2021 1102   CREATININE 0.81 11/02/2015 1251   CALCIUM 9.1 09/06/2021 1102   GFRNONAA >60 09/06/2021 1102   GFRAA >60 08/09/2019 1038    BNP    Component Value Date/Time   BNP 222.4 (H) 09/06/2021 1102     Imaging:  No results found.       Latest Ref Rng & Units 01/12/2017    9:38 AM  PFT Results  FVC-Pre L 1.46   FVC-Predicted Pre % 55   FVC-Post L 1.64   FVC-Predicted Post % 61   Pre FEV1/FVC % % 52   Post FEV1/FCV % % 54   FEV1-Pre L 0.76   FEV1-Predicted Pre % 37   FEV1-Post L 0.88   DLCO uncorrected ml/min/mmHg 9.68   DLCO UNC% % 47   DLVA Predicted % 78   TLC L 4.30   TLC % Predicted % 93   RV % Predicted % 134     No results found for: "NITRICOXIDE"      Assessment & Plan:   COPD, severe (HCC) Severe COPD with recent exacerbation. She is clinically improved  and seems to have recovered well. No changes to her regimen today. Encouraged her to remain active. She understands that her cough and breathing are made worse by her smoking and would like to quit; struggling with life stressors.   Patient Instructions  Continue Albuterol inhaler 2 puffs or 3 mL neb every 6 hours as needed for shortness of breath or wheezing. Notify if symptoms persist despite rescue inhaler/neb use. Continue Breztri 2 puffs Twice daily. Brush tongue and rinse mouth afterwards Continue Mucinex 600 mg Twice daily for congestion/cough  Nicotine patches - apply 14 mg patch daily for 6 weeks then decrease to 7 mg patch for 2 weeks.  Goal quit date: 10/17/2021   Follow up in three months with Dr. Melvyn Novas or Alanson Aly. If symptoms do not improve or worsen, please contact office for sooner follow up or seek emergency care.     Cigarette smoker She was down to only 1-2 cigarettes a day. Unfortunately, started smoking more again. She is actively trying to cut back but having difficulties quitting. Previously tried wellbutrin without much success. She is agreeable to trying nicotine patches - rx sent today.  The patient was advised to quit and impact of smoking on their health.  I assessed the patient's willingness to attempt to quit. I provided methods and skills for cessation. We reviewed medication management of smoking session drugs if appropriate. Resources to help quit smoking were provided. A smoking cessation quit date was set: 10/17/2021 Follow-up was arranged in our clinic.  The amount of time spent counseling patient was 4 mins    I spent 32 minutes of dedicated to the care of this patient on the date of this encounter to include  pre-visit review of records, face-to-face time with the patient discussing conditions above, post visit ordering of testing, clinical documentation with the electronic health record, making appropriate referrals as documented, and communicating  necessary findings to members of the patients care team.  Clayton Bibles, NP 09/16/2021  Pt aware and understands NP's role.

## 2021-09-16 NOTE — Assessment & Plan Note (Signed)
Severe COPD with recent exacerbation. She is clinically improved and seems to have recovered well. No changes to her regimen today. Encouraged her to remain active. She understands that her cough and breathing are made worse by her smoking and would like to quit; struggling with life stressors.   Patient Instructions  Continue Albuterol inhaler 2 puffs or 3 mL neb every 6 hours as needed for shortness of breath or wheezing. Notify if symptoms persist despite rescue inhaler/neb use. Continue Breztri 2 puffs Twice daily. Brush tongue and rinse mouth afterwards Continue Mucinex 600 mg Twice daily for congestion/cough  Nicotine patches - apply 14 mg patch daily for 6 weeks then decrease to 7 mg patch for 2 weeks.  Goal quit date: 10/17/2021   Follow up in three months with Dr. Melvyn Novas or Alanson Aly. If symptoms do not improve or worsen, please contact office for sooner follow up or seek emergency care.

## 2021-09-16 NOTE — Assessment & Plan Note (Signed)
She was down to only 1-2 cigarettes a day. Unfortunately, started smoking more again. She is actively trying to cut back but having difficulties quitting. Previously tried wellbutrin without much success. She is agreeable to trying nicotine patches - rx sent today.  The patient was advised to quit and impact of smoking on their health.  I assessed the patient's willingness to attempt to quit. I provided methods and skills for cessation. We reviewed medication management of smoking session drugs if appropriate. Resources to help quit smoking were provided. A smoking cessation quit date was set: 10/17/2021 Follow-up was arranged in our clinic.  The amount of time spent counseling patient was 4 mins

## 2021-09-17 ENCOUNTER — Encounter: Payer: Self-pay | Admitting: Cardiology

## 2021-09-18 ENCOUNTER — Telehealth: Payer: Self-pay | Admitting: Nurse Practitioner

## 2021-09-18 LAB — CUP PACEART REMOTE DEVICE CHECK
Battery Remaining Longevity: 78 mo
Battery Remaining Percentage: 87 %
Brady Statistic RA Percent Paced: 41 %
Brady Statistic RV Percent Paced: 85 %
Date Time Interrogation Session: 20230829150300
HighPow Impedance: 70 Ohm
Implantable Lead Implant Date: 20190226
Implantable Lead Implant Date: 20190226
Implantable Lead Implant Date: 20190226
Implantable Lead Location: 753858
Implantable Lead Location: 753859
Implantable Lead Location: 753860
Implantable Lead Model: 292
Implantable Lead Model: 4671
Implantable Lead Model: 7740
Implantable Lead Serial Number: 444536
Implantable Lead Serial Number: 726363
Implantable Lead Serial Number: 805938
Implantable Pulse Generator Implant Date: 20190226
Lead Channel Impedance Value: 486 Ohm
Lead Channel Impedance Value: 517 Ohm
Lead Channel Impedance Value: 781 Ohm
Lead Channel Setting Pacing Amplitude: 2 V
Lead Channel Setting Pacing Amplitude: 2.5 V
Lead Channel Setting Pacing Amplitude: 2.6 V
Lead Channel Setting Pacing Pulse Width: 0.4 ms
Lead Channel Setting Pacing Pulse Width: 0.4 ms
Lead Channel Setting Sensing Sensitivity: 0.5 mV
Lead Channel Setting Sensing Sensitivity: 1 mV
Pulse Gen Serial Number: 204176

## 2021-09-19 MED ORDER — BREZTRI AEROSPHERE 160-9-4.8 MCG/ACT IN AERO: 2.0000 | INHALATION_SPRAY | Freq: Two times a day (BID) | RESPIRATORY_TRACT | 3 refills | Status: DC

## 2021-09-19 NOTE — Telephone Encounter (Signed)
Rx for pt's breztri has been sent electronically to [facility]. Called and spoke with pt letting her know that this had been done and she verbalized understanding.   While speaking with pt, she said that last time she received meds from [facility] for the [facility], they sent her a lot of medication and she said she has some left but it expired in July. Pt wants to know if it would be okay for her to still use that or what she should do. Katie, please advise.

## 2021-09-19 NOTE — Telephone Encounter (Signed)
If it is expired, would not recommend that she use it. Thanks.

## 2021-09-20 NOTE — Telephone Encounter (Signed)
Called and spoke with pt letting her know the info per KC and she verbalized understanding. Nothing further needed. 

## 2021-10-03 ENCOUNTER — Telehealth: Payer: Self-pay

## 2021-10-03 NOTE — Telephone Encounter (Signed)
Pt called back after consulting [employer] and Joey with [employer].    Per Joey, the Iphone 15 is not contraindicated to use.  Pt are recommended to use the telephone away from their heart, and store it in a purse or back pocket.  The telephone should not be kept in a breast pocket near the heart.    Pt made aware of these facts / shared.  Pt stated she called [employer] and received the same information.    No follow up required at this time.

## 2021-10-03 NOTE — Telephone Encounter (Signed)
Pt called stating that Dr. Lovena Le advised her not to get an Iphone 15. She wants to know why. I told her I will have his nurse give her a call back.

## 2021-10-07 ENCOUNTER — Encounter: Payer: Medicare Other | Attending: Physician Assistant | Admitting: Physician Assistant

## 2021-10-07 DIAGNOSIS — I110 Hypertensive heart disease with heart failure: Secondary | ICD-10-CM | POA: Diagnosis not present

## 2021-10-07 DIAGNOSIS — J449 Chronic obstructive pulmonary disease, unspecified: Secondary | ICD-10-CM | POA: Insufficient documentation

## 2021-10-07 DIAGNOSIS — I420 Dilated cardiomyopathy: Secondary | ICD-10-CM | POA: Insufficient documentation

## 2021-10-07 DIAGNOSIS — F17218 Nicotine dependence, cigarettes, with other nicotine-induced disorders: Secondary | ICD-10-CM | POA: Insufficient documentation

## 2021-10-07 DIAGNOSIS — I890 Lymphedema, not elsewhere classified: Secondary | ICD-10-CM | POA: Insufficient documentation

## 2021-10-07 DIAGNOSIS — L97822 Non-pressure chronic ulcer of other part of left lower leg with fat layer exposed: Secondary | ICD-10-CM | POA: Diagnosis not present

## 2021-10-07 DIAGNOSIS — I739 Peripheral vascular disease, unspecified: Secondary | ICD-10-CM | POA: Diagnosis not present

## 2021-10-07 DIAGNOSIS — E785 Hyperlipidemia, unspecified: Secondary | ICD-10-CM | POA: Diagnosis not present

## 2021-10-07 DIAGNOSIS — I509 Heart failure, unspecified: Secondary | ICD-10-CM | POA: Diagnosis not present

## 2021-10-07 DIAGNOSIS — T8131XA Disruption of external operation (surgical) wound, not elsewhere classified, initial encounter: Secondary | ICD-10-CM | POA: Diagnosis not present

## 2021-10-07 NOTE — Progress Notes (Signed)
Kimberly [name] ([id number]) Visit Report for 10/07/2021 Chief Complaint Document Details Patient Name: Kimberly [name] Date of Service: 10/07/2021 8:45 AM Medical Record Number: [id number] Patient Account Number: [id number] Date of Birth/Sex: 02/22/1945 (76 y.o. F) Treating RN: [name] Primary Care Provider: Cathlean Cower Other Clinician: Referring Provider: Self, Referral Treating Provider/Extender: Skipper Cliche in Treatment: 0 Information Obtained from: Patient Chief Complaint Left LE surgical ulcer Electronic Signature(s) Signed: 10/07/2021 9:29:59 AM By: Worthy Keeler PA-C Entered By: Worthy Keeler on 10/07/2021 09:29:59 Kimberly [name] ([id number]) -------------------------------------------------------------------------------- Debridement Details Patient Name: Kimberly [name] Date of Service: 10/07/2021 8:45 AM Medical Record Number: [id number] Patient Account Number: [id number] Date of Birth/Sex: 04-24-1945 (76 y.o. F) Treating RN: [name] Primary Care Provider: Cathlean Cower Other Clinician: Referring Provider: Self, Referral Treating Provider/Extender: Skipper Cliche in Treatment: 0 Debridement Performed for Wound #4 Left,Lateral Lower Leg Assessment: Performed By: Physician Tommie Sams., PA-C Debridement Type: Chemical/Enzymatic/Mechanical Agent Used: saline and gauze Level of Consciousness (Pre- Awake and Alert procedure): Pre-procedure Verification/Time Out Yes - 09:37 Taken: Start Time: 09:37 Pain Control: Lidocaine 4% Topical Solution Instrument: Other : saline gauze Bleeding: Moderate Hemostasis Achieved: Pressure End Time: 09:38 Procedural Pain: 0 Post Procedural Pain: 0 Response to Treatment: Procedure was tolerated well Level of Consciousness (Post- Awake and Alert procedure): Post Debridement Measurements of Total Wound Length: (cm) 0.7 Width: (cm) 3 Depth: (cm) 0.3 Volume: (cm) 0.495 Character of Wound/Ulcer Post Debridement:  Improved Post Procedure Diagnosis Same as Pre-procedure Electronic Signature(s) Signed: 10/07/2021 2:31:54 PM By: [name] RN Signed: 10/07/2021 4:49:05 PM By: Worthy Keeler PA-C Entered By: [name] on 10/07/2021 09:37:48 Kimberly [name] ([id number]) -------------------------------------------------------------------------------- HPI Details Patient Name: Kimberly [name] Date of Service: 10/07/2021 8:45 AM Medical Record Number: [id number] Patient Account Number: [id number] Date of Birth/Sex: Jan 14, 1946 (76 y.o. F) Treating RN: [name] Primary Care Provider: Cathlean Cower Other Clinician: Referring Provider: Self, Referral Treating Provider/Extender: Skipper Cliche in Treatment: 0 History of Present Illness HPI Description: 76 year old patient was seen by the nurse practitioner at the [facility] primary care for a wound which has been there on her right leg since August 2018. For a period of time she has been treated with doxycycline, clindamycin and Bactrim and the wound continues to have drainage and odor.on 11/20/2016 she was again put on Bactrim DS and referred to the wound center. Past medical history significant for hypertension, congestive heart failure, COPD, dyslipidemia, migraines, pericarditis, status post VSD repair, cholecystectomy tubal ligation, cesarean section, coronary angiogram,my tenotomy with tube placement on the right,dilated cardiomyopathy,tobacco abuse and has been a smoker for the last 35 years. She also has a history of right great saphenous vein ablation but the patient does not confirm this for sure. She has had a venous duplex study done in August 2018 which showed no evidence of lower extremity deep or superficial venous thrombus or incompetence bilaterally. She has a arterial duplex study pending, which is going to be done later today. 12/22/16 on evaluation today patient appears to be doing fairly well in regard to her right lateral lower extremity  wounds. She did undergo a right common femoral artery balloon angioplasty and stent placement which was performed on 12/17/16. Patient this is did appear to be a sufficient improvement according to the reviewed notes which is excellent news. Prior to this procedure patient did have a study of the lower extremity this was a Doppler performed on 12/08/16 this revealed moderate right lower extremity  arterial disease involving the common femoral artery and iliac segment. There was also left arterial obstruction involving the iliac segment. On the left there was arterial obstruction involving the iliac segment, common femoral artery, and peroneal artery. Patient states she is still having some soreness left over and residual from the procedure. However overall she appears to be doing very well. She does have a follow-up study with vascular in the next week. 01/02/2017 -- was seen by Dr. Jacqulyn Cane -- she had an abdominal aortogram with lower extremity peripheral vascular intervention, and this showed moderate left common iliac artery stenosis. Short occlusion of the right common femoral artery with no significant infrainguinal disease. Short occlusion of the left common femoral artery with no significant infrainguinal disease and a successful drug-coated balloon angioplasty stent was placed in the right common femoral artery. She had a lower extremity arterial duplex examination done which showed the resting right ABI is within normal range and no evidence of significant right lower extremity arterial disease. right ABI is 1.08 with a toe pressure being 0.54. there was biphasic flow. The left ABI was 0.77 and the toe and this was 0.38 with monophasic flow.The right toe brachial indicis were abnormal. The left resting ABI indicates moderate left lower extremity arterial disease in the left toe brachial index is abnormal. 01/09/17 on evaluation today patient presents with continued ulcerations noted of  the right lower extremity. This does seem to be doing a little bit better but still is slough covered. She has been tolerating the dressing changes fairly well but states that the debridement does tend to bother her pain wise. No fevers, chills, nausea, or vomiting noted at this time. She does tell me that the alginate dressing that she was looking to use following her last evaluation is expensive and insurance will not cover this it's actually cheaper for her to utilize the [place]. 02/06/17 on evaluation today patient's wound appears to be doing excellent at this point in time. In fact she has been tolerating the dressing changes very well there does not even appear to be any evidence or need for debridement today. She's not having any significant pain which is good news. No fevers, chills, nausea, or vomiting noted at this time. Overall she is extremely pleased with how things have progressed. 02/20/17 on evaluation today patient appears to be doing well in regard to her right lower extremity ulcer. In fact this appears to be completely healed. She still has some dry pinpoint scab like areas noted over the lower extremity but fortunately nothing significant and none of these seem to have any wound underneath when they come off it is completely healed underneath. Nonetheless we are gonna let this work itself off not try to do anything to invasive. 03/06/17 on evaluation today patient appears to be doing extremely well in regard to her right lateral lower extremity ulcer site. She appears to be completely healed and there is no evidence of residual ulceration at this time which is great news. Readmission: 10/08/2020 upon evaluation today patient appears to be doing somewhat poorly in regard to a wound on her right leg. She tells me about a month ago one of her dogs scratched her and then subsequently this developed into a raised area that she has been concerned with. Fortunately there does not appear to  be any pain per se with regard to this area. Unfortunately there is some concern in my mind to be honest about the possibility of a cancerous lesion here.  Initially the plan was to actually do a punch biopsy as I proceeded with the procedure it became apparent that that was not necessarily going to be the best way to go I ended up removing the entirety of the raised area. It was somewhat coming loose just after removing the punch anyway. . The patient does have peripheral vascular disease though she recently had a stent placed. She also has a defibrillator pacemaker at this point. She does have lymphedema and does continue to smoke. She has no desire to quit. 10/18/2020 since I last saw this patient we have actually made a referral for her to have further evaluation and treatment with the skin surgery center we also tried [facility] dermatology she really needs a Mohs surgeon. Nonetheless screens were dermatology was not taking any new patients whatsoever and at the skin surgery center in [place] this is good to be something they should be calling her to get scheduled hopefully shortly. Nonetheless she is actually doing better with the wound measuring significantly smaller despite the cancer diagnosis. Nonetheless I still think there is some work to do as far as what she is getting need from an excision standpoint. 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 YULEIMY, KRETZ ([id number]) 10/25/2020 upon evaluation today patient presents for follow-up with regard to her right thigh ulcer. This actually appears to be completely healed. Again this was diagnosed as being positive for squamous cell carcinoma. Subsequently it still healed after we removed everything that we did. Unfortunately the margins were not clear so she still does have to go to the skin surgery center and she will be seeing them on 25  October. Readmission: 10-07-2021 upon evaluation today patient appears to be doing poorly in regard to the wound that that as a result of a surgical resection of a skin cancer performed by Dr. Garth Schlatter. This was performed on 04-03-2021 and has been very slow to heal. Fortunately there does not appear to be any signs of active infection at this time which is great news and overall very pleased in that regard there is some necrotic tissue however on the surface of the wound. With that being said the patient has not had any recent debridements. She also has not had any recent arterial studies her ABI today was 0.53 on the left although in cleaning the wound actually appears she has pretty good blood flow at this point. Patient's past medical history really has not changed significantly she still is a current every day smoker. Electronic Signature(s) Signed: 10/07/2021 10:13:54 AM By: Worthy Keeler PA-C Entered By: Worthy Keeler on 10/07/2021 10:13:53 Kimberly [name] ([id number]) -------------------------------------------------------------------------------- Physical Exam Details Patient Name: Kimberly [name] Date of Service: 10/07/2021 8:45 AM Medical Record Number: [id number] Patient Account Number: [id number] Date of Birth/Sex: 1945-10-11 (76 y.o. F) Treating RN: [name] Primary Care Provider: Cathlean Cower Other Clinician: Referring Provider: Self, Referral Treating Provider/Extender: Skipper Cliche in Treatment: 0 Constitutional sitting or standing blood pressure is within target range for patient.. pulse regular and within target range for patient.Marland Kitchen respirations regular, non- labored and within target range for patient.Marland Kitchen temperature within target range for patient.. Well-nourished and well-hydrated in no acute distress. Eyes conjunctiva clear no eyelid edema noted. pupils equal round and reactive to light and accommodation. Ears, Nose, Mouth, and Throat no gross abnormality of ear  auricles or external auditory canals. normal hearing noted during conversation. mucus membranes moist.  Respiratory normal breathing without difficulty. Cardiovascular Absent posterior tibial and dorsalis pedis pulses bilateral lower extremities. Musculoskeletal normal gait and posture. no significant deformity or arthritic changes, no loss or range of motion, no clubbing. Notes Upon inspection patient's wound bed actually showed signs of necrotic tissue however I was able to use saline and gauze as well as a sterile Q-tip to remove this I avoided sharp debridement due to the ABI findings here in the clinic today. She does have a follow-up with Dr. Mariea Clonts her vascular specialist coming up shortly and to be honest I think that she probably needs to have a repeat arterial study with ABI and TBI on this left leg she has a stent on the right leg already. This was discussed with the patient today as well otherwise I was able to clean this up sufficiently enough in order to see good blood flow and a healthy surface I think this is actually doing quite well. Electronic Signature(s) Signed: 10/07/2021 10:14:40 AM By: Worthy Keeler PA-C Entered By: Worthy Keeler on 10/07/2021 10:14:40 Kimberly [name] ([id number]) -------------------------------------------------------------------------------- Physician Orders Details Patient Name: Kimberly [name] Date of Service: 10/07/2021 8:45 AM Medical Record Number: [id number] Patient Account Number: [id number] Date of Birth/Sex: 1946/01/14 (76 y.o. F) Treating RN: [name] Primary Care Provider: Cathlean Cower Other Clinician: Referring Provider: Self, Referral Treating Provider/Extender: Skipper Cliche in Treatment: 0 Verbal / Phone Orders: No Diagnosis Coding ICD-10 Coding Code Description T81.31XA Disruption of external operation (surgical) wound, not elsewhere classified, initial encounter L97.822 Non-pressure chronic ulcer of other part of left  lower leg with fat layer exposed I73.9 Peripheral vascular disease, unspecified I89.0 Lymphedema, not elsewhere classified F17.218 Nicotine dependence, cigarettes, with other nicotine-induced disorders Follow-up Appointments o Return Appointment in 1 week. Bathing/ Shower/ Hygiene o Wash wounds with antibacterial soap and water. Anesthetic (Use 'Patient Medications' Section for Anesthetic Order Entry) o Lidocaine applied to wound bed Edema Control - Lymphedema / Segmental Compressive Device / Other o Elevate, Exercise Daily and Avoid Standing for Long Periods of Time. o Elevate legs to the level of the heart and pump ankles as often as possible o Elevate leg(s) parallel to the floor when sitting. Wound Treatment Wound #4 - Lower Leg Wound Laterality: Left, Lateral Cleanser: [place] Ancillary Kit - 15 Day Supply (DME) (Generic) 1 x Per Day/30 Days Discharge Instructions: Use supplies as instructed; Kit contains: (15) Saline Bullets; (15) 3x3 Gauze; 15 pr Gloves Cleanser: Soap and Water 1 x Per Day/30 Days Discharge Instructions: Gently cleanse wound with antibacterial soap, rinse and pat dry prior to dressing wounds Primary Dressing: Medihoney - Gel, 1.5 (oz), tube 1 x Per Day/30 Days Secondary Dressing: ABD Pad 5x9 (in/in) (DME) (Generic) 1 x Per Day/30 Days Discharge Instructions: Cover with ABD pad Secured With: Stretch Net Dressing, Latex-free, Size 5, Small-Head / Shoulder / Thigh (DME) (Generic) 1 x Per Day/30 Days Electronic Signature(s) Signed: 10/07/2021 2:31:54 PM By: [name] RN Signed: 10/07/2021 4:49:05 PM By: Worthy Keeler PA-C Entered By: [name] on 10/07/2021 09:38:52 Kimberly [name] ([id number]) -------------------------------------------------------------------------------- Problem List Details Patient Name: Kimberly [name] Date of Service: 10/07/2021 8:45 AM Medical Record Number: [id number] Patient Account Number: [id number] Date of Birth/Sex:  [birth year] (76 y.o. F) Treating RN: [name] Primary Care Provider: Cathlean Cower Other Clinician: Referring Provider: Self, Referral Treating Provider/Extender: Skipper Cliche in Treatment: 0 Active Problems ICD-10 Encounter Code Description Active Date MDM Diagnosis T81.31XA Disruption of external operation (surgical) wound, not  elsewhere 10/07/2021 No Yes classified, initial encounter L97.822 Non-pressure chronic ulcer of other part of left lower leg with fat layer 10/07/2021 No Yes exposed I73.9 Peripheral vascular disease, unspecified 10/07/2021 No Yes I89.0 Lymphedema, not elsewhere classified 10/07/2021 No Yes F17.218 Nicotine dependence, cigarettes, with other nicotine-induced disorders 10/07/2021 No Yes Inactive Problems Resolved Problems Electronic Signature(s) Signed: 10/07/2021 9:29:45 AM By: Worthy Keeler PA-C Entered By: Worthy Keeler on 10/07/2021 09:29:45 Kimberly [name] ([id number]) -------------------------------------------------------------------------------- Progress Note Details Patient Name: Kimberly [name] Date of Service: 10/07/2021 8:45 AM Medical Record Number: [id number] Patient Account Number: [id number] Date of Birth/Sex: 21-Jul-1945 (76 y.o. F) Treating RN: [name] Primary Care Provider: Cathlean Cower Other Clinician: Referring Provider: Self, Referral Treating Provider/Extender: Skipper Cliche in Treatment: 0 Subjective Chief Complaint Information obtained from Patient Left LE surgical ulcer History of Present Illness (HPI) 76 year old patient was seen by the nurse practitioner at the [facility] primary care for a wound which has been there on her right leg since August 2018. For a period of time she has been treated with doxycycline, clindamycin and Bactrim and the wound continues to have drainage and odor.on 11/20/2016 she was again put on Bactrim DS and referred to the wound center. Past medical history significant for hypertension,  congestive heart failure, COPD, dyslipidemia, migraines, pericarditis, status post VSD repair, cholecystectomy tubal ligation, cesarean section, coronary angiogram,my tenotomy with tube placement on the right,dilated cardiomyopathy,tobacco abuse and has been a smoker for the last 35 years. She also has a history of right great saphenous vein ablation but the patient does not confirm this for sure. She has had a venous duplex study done in August 2018 which showed no evidence of lower extremity deep or superficial venous thrombus or incompetence bilaterally. She has a arterial duplex study pending, which is going to be done later today. 12/22/16 on evaluation today patient appears to be doing fairly well in regard to her right lateral lower extremity wounds. She did undergo a right common femoral artery balloon angioplasty and stent placement which was performed on 12/17/16. Patient this is did appear to be a sufficient improvement according to the reviewed notes which is excellent news. Prior to this procedure patient did have a study of the lower extremity this was a Doppler performed on 12/08/16 this revealed moderate right lower extremity arterial disease involving the common femoral artery and iliac segment. There was also left arterial obstruction involving the iliac segment. On the left there was arterial obstruction involving the iliac segment, common femoral artery, and peroneal artery. Patient states she is still having some soreness left over and residual from the procedure. However overall she appears to be doing very well. She does have a follow-up study with vascular in the next week. 01/02/2017 -- was seen by Dr. Jacqulyn Cane -- she had an abdominal aortogram with lower extremity peripheral vascular intervention, and this showed moderate left common iliac artery stenosis. Short occlusion of the right common femoral artery with no significant infrainguinal disease. Short occlusion of  the left common femoral artery with no significant infrainguinal disease and a successful drug-coated balloon angioplasty stent was placed in the right common femoral artery. She had a lower extremity arterial duplex examination done which showed the resting right ABI is within normal range and no evidence of significant right lower extremity arterial disease. right ABI is 1.08 with a toe pressure being 0.54. there was biphasic flow. The left ABI was 0.77 and the toe and this was 0.38 with  monophasic flow.The right toe brachial indicis were abnormal. The left resting ABI indicates moderate left lower extremity arterial disease in the left toe brachial index is abnormal. 01/09/17 on evaluation today patient presents with continued ulcerations noted of the right lower extremity. This does seem to be doing a little bit better but still is slough covered. She has been tolerating the dressing changes fairly well but states that the debridement does tend to bother her pain wise. No fevers, chills, nausea, or vomiting noted at this time. She does tell me that the alginate dressing that she was looking to use following her last evaluation is expensive and insurance will not cover this it's actually cheaper for her to utilize the [place]. 02/06/17 on evaluation today patient's wound appears to be doing excellent at this point in time. In fact she has been tolerating the dressing changes very well there does not even appear to be any evidence or need for debridement today. She's not having any significant pain which is good news. No fevers, chills, nausea, or vomiting noted at this time. Overall she is extremely pleased with how things have progressed. 02/20/17 on evaluation today patient appears to be doing well in regard to her right lower extremity ulcer. In fact this appears to be completely healed. She still has some dry pinpoint scab like areas noted over the lower extremity but fortunately nothing significant  and none of these seem to have any wound underneath when they come off it is completely healed underneath. Nonetheless we are gonna let this work itself off not try to do anything to invasive. 03/06/17 on evaluation today patient appears to be doing extremely well in regard to her right lateral lower extremity ulcer site. She appears to be completely healed and there is no evidence of residual ulceration at this time which is great news. Readmission: 10/08/2020 upon evaluation today patient appears to be doing somewhat poorly in regard to a wound on her right leg. She tells me about a month ago one of her dogs scratched her and then subsequently this developed into a raised area that she has been concerned with. Fortunately there does not appear to be any pain per se with regard to this area. Unfortunately there is some concern in my mind to be honest about the possibility of a cancerous lesion here. Initially the plan was to actually do a punch biopsy as I proceeded with the procedure it became apparent that that was not necessarily going to be the best way to go I ended up removing the entirety of the raised area. It was somewhat coming loose just after removing the punch anyway. . The patient does have peripheral vascular disease though she recently had a stent placed. She also has a defibrillator pacemaker at this point. She does have lymphedema and does continue to smoke. She has no desire to quit. 10/18/2020 since I last saw this patient we have actually made a referral for her to have further evaluation and treatment with the skin surgery [facility], RIDDELL ([id number]) center we also tried [facility] dermatology she really needs a Mohs surgeon. Nonetheless screens were dermatology was not taking any new patients whatsoever and at the skin surgery center in [place] this is good to be something they should be calling her to get scheduled hopefully shortly. Nonetheless she is actually doing  better with the wound measuring significantly smaller despite the cancer diagnosis. Nonetheless I still think there is some work to do as far as what she  is getting need from an excision standpoint. 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 10/25/2020 upon evaluation today patient presents for follow-up with regard to her right thigh ulcer. This actually appears to be completely healed. Again this was diagnosed as being positive for squamous cell carcinoma. Subsequently it still healed after we removed everything that we did. Unfortunately the margins were not clear so she still does have to go to the skin surgery center and she will be seeing them on 25 October. Readmission: 10-07-2021 upon evaluation today patient appears to be doing poorly in regard to the wound that that as a result of a surgical resection of a skin cancer performed by Dr. Garth Schlatter. This was performed on 04-03-2021 and has been very slow to heal. Fortunately there does not appear to be any signs of active infection at this time which is great news and overall very pleased in that regard there is some necrotic tissue however on the surface of the wound. With that being said the patient has not had any recent debridements. She also has not had any recent arterial studies her ABI today was 0.53 on the left although in cleaning the wound actually appears she has pretty good blood flow at this point. Patient's past medical history really has not changed significantly she still is a current every day smoker. Patient History Information obtained from Patient. Allergies tape, permeable adhesive, codeine Family History Cancer - Father, Heart Disease - Siblings, Hypertension - Mother, Tuberculosis - Father, No family history of Diabetes, Hereditary Spherocytosis, Kidney Disease, Lung Disease, Seizures, Stroke, Thyroid Problems. Social History Current every day  smoker, Marital Status - Married, Alcohol Use - Never, Drug Use - No History, Caffeine Use - Daily. Medical History Hematologic/Lymphatic Patient has history of Anemia - hx Respiratory Patient has history of Asthma, Chronic Obstructive Pulmonary Disease (COPD) Cardiovascular Patient has history of Congestive Heart Failure, Hypertension, Myocardial Infarction, Peripheral Arterial Disease Objective Constitutional sitting or standing blood pressure is within target range for patient.. pulse regular and within target range for patient.Marland Kitchen respirations regular, non- labored and within target range for patient.Marland Kitchen temperature within target range for patient.. Well-nourished and well-hydrated in no acute distress. Vitals Time Taken: 8:50 AM, Height: 61 in, Source: Stated, Weight: 106 lbs, Source: Stated, BMI: 20, Temperature: 98.1 F, Pulse: 60 bpm, Respiratory Rate: 16 breaths/min, Blood Pressure: 113/61 mmHg. Eyes conjunctiva clear no eyelid edema noted. pupils equal round and reactive to light and accommodation. Ears, Nose, Mouth, and Throat no gross abnormality of ear auricles or external auditory canals. normal hearing noted during conversation. mucus membranes moist. Respiratory normal breathing without difficulty. Cardiovascular Absent posterior tibial and dorsalis pedis pulses bilateral lower extremities. Musculoskeletal normal gait and posture. no significant deformity or arthritic changes, no loss or range of motion, no clubbing. SAMINA, WEEKES ([id number]) General Notes: Upon inspection patient's wound bed actually showed signs of necrotic tissue however I was able to use saline and gauze as well as a sterile Q-tip to remove this I avoided sharp debridement due to the ABI findings here in the clinic today. She does have a follow-up with Dr. Mariea Clonts her vascular specialist coming up shortly and to be honest I think that she probably needs to have a repeat arterial study with ABI and TBI on  this left leg she has a stent on the right leg already. This was discussed with the patient today as well otherwise I was able  to clean this up sufficiently enough in order to see good blood flow and a healthy surface I think this is actually doing quite well. Integumentary (Hair, Skin) Wound #4 status is Open. Original cause of wound was Surgical Injury. The date acquired was: 04/03/2021. The wound is located on the Left,Lateral Lower Leg. The wound measures 0.7cm length x 3cm width x 0.3cm depth; 1.649cm^2 area and 0.495cm^3 volume. There is no tunneling or undermining noted. There is a medium amount of serosanguineous drainage noted. There is no granulation within the wound bed. There is a large (67-100%) amount of necrotic tissue within the wound bed including Adherent Slough. Assessment Active Problems ICD-10 Disruption of external operation (surgical) wound, not elsewhere classified, initial encounter Non-pressure chronic ulcer of other part of left lower leg with fat layer exposed Peripheral vascular disease, unspecified Lymphedema, not elsewhere classified Nicotine dependence, cigarettes, with other nicotine-induced disorders Procedures Wound #4 Pre-procedure diagnosis of Wound #4 is a Dehisced Wound located on the Left,Lateral Lower Leg . There was a Chemical/Enzymatic/Mechanical debridement performed by Tommie Sams., PA-C. With the following instrument(s): saline gauze after achieving pain control using Lidocaine 4% Topical Solution. Other agent used was saline and gauze. A time out was conducted at 09:37, prior to the start of the procedure. A Moderate amount of bleeding was controlled with Pressure. The procedure was tolerated well with a pain level of 0 throughout and a pain level of 0 following the procedure. Post Debridement Measurements: 0.7cm length x 3cm width x 0.3cm depth; 0.495cm^3 volume. Character of Wound/Ulcer Post Debridement is improved. Post procedure Diagnosis  Wound #4: Same as Pre-Procedure Plan Follow-up Appointments: Return Appointment in 1 week. Bathing/ Shower/ Hygiene: Wash wounds with antibacterial soap and water. Anesthetic (Use 'Patient Medications' Section for Anesthetic Order Entry): Lidocaine applied to wound bed Edema Control - Lymphedema / Segmental Compressive Device / Other: Elevate, Exercise Daily and Avoid Standing for Long Periods of Time. Elevate legs to the level of the heart and pump ankles as often as possible Elevate leg(s) parallel to the floor when sitting. WOUND #4: - Lower Leg Wound Laterality: Left, Lateral Cleanser: [place]ram Ancillary Kit - 15 Day Supply (DME) (Generic) 1 x Per Day/30 Days Discharge Instructions: Use supplies as instructed; Kit contains: (15) Saline Bullets; (15) 3x3 Gauze; 15 pr Gloves Cleanser: Soap and Water 1 x Per Day/30 Days Discharge Instructions: Gently cleanse wound with antibacterial soap, rinse and pat dry prior to dressing wounds Primary Dressing: Medihoney - Gel, 1.5 (oz), tube 1 x Per Day/30 Days Secondary Dressing: ABD Pad 5x9 (in/in) (DME) (Generic) 1 x Per Day/30 Days Discharge Instructions: Cover with ABD pad Secured With: Stretch Net Dressing, Latex-free, Size 5, Small-Head / Shoulder / Thigh (DME) (Generic) 1 x Per Day/30 Days 1. I am good recommend currently that we go ahead and initiate a continuation of treatment with the Medihoney which she will probably be the best way to go I think that this should do quite well for her. Olney, Quinette B. ([id number]) 2. I am also can recommend that we have the patient continue with the ABD pads to cover as well as stretch net to hold in place this will prevent any skin from being in touch with an adhesive which has been bothering her skin. 3. I am also going to suggest the patient should continue to monitor for any signs of worsening or infection if anything changes she should let me know. We will see patient back for reevaluation in 1 week  here in the clinic. If anything worsens or changes patient will contact our office for additional recommendations. Electronic Signature(s) Signed: 10/07/2021 10:15:23 AM By: Worthy Keeler PA-C Entered By: Worthy Keeler on 10/07/2021 10:15:23 Kimberly [name] ([id number]) -------------------------------------------------------------------------------- ROS/PFSH Details Patient Name: Kimberly [name] Date of Service: 10/07/2021 8:45 AM Medical Record Number: [id number] Patient Account Number: [id number] Date of Birth/Sex: 03-29-1945 (76 y.o. F) Treating RN: [name] Primary Care Provider: Cathlean Cower Other Clinician: Referring Provider: Self, Referral Treating Provider/Extender: Skipper Cliche in Treatment: 0 Information Obtained From Patient Hematologic/Lymphatic Medical History: Positive for: Anemia - hx Respiratory Medical History: Positive for: Asthma; Chronic Obstructive Pulmonary Disease (COPD) Cardiovascular Medical History: Positive for: Congestive Heart Failure; Hypertension; Myocardial Infarction; Peripheral Arterial Disease Immunizations Pneumococcal Vaccine: Received Pneumococcal Vaccination: Yes Received Pneumococcal Vaccination On or After 60th Birthday: Yes Implantable Devices Yes Family and Social History Cancer: Yes - Father; Diabetes: No; Heart Disease: Yes - Siblings; Hereditary Spherocytosis: No; Hypertension: Yes - Mother; Kidney Disease: No; Lung Disease: No; Seizures: No; Stroke: No; Thyroid Problems: No; Tuberculosis: Yes - Father; Current every day smoker; Marital Status - Married; Alcohol Use: Never; Drug Use: No History; Caffeine Use: Daily; Financial Concerns: No; Food, Clothing or Shelter Needs: No; Support System Lacking: No; Transportation Concerns: No Electronic Signature(s) Signed: 10/07/2021 2:31:54 PM By: [name] RN Signed: 10/07/2021 4:49:05 PM By: Worthy Keeler PA-C Entered By: [name] on 10/07/2021 08:56:40 Kimberly [name]  ([id number]) -------------------------------------------------------------------------------- SuperBill Details Patient Name: Kimberly [name] Date of Service: 10/07/2021 Medical Record Number: [id number] Patient Account Number: [id number] Date of Birth/Sex: 1945-08-12 (76 y.o. F) Treating RN: [name] Primary Care Provider: Cathlean Cower Other Clinician: Referring Provider: Self, Referral Treating Provider/Extender: Skipper Cliche in Treatment: 0 Diagnosis Coding ICD-10 Codes Code Description T81.31XA Disruption of external operation (surgical) wound, not elsewhere classified, initial encounter L97.822 Non-pressure chronic ulcer of other part of left lower leg with fat layer exposed I73.9 Peripheral vascular disease, unspecified I89.0 Lymphedema, not elsewhere classified F17.218 Nicotine dependence, cigarettes, with other nicotine-induced disorders Physician Procedures CPT4 Code: 8346219 Description: 99213 - WC PHYS LEVEL 3 - EST PT Modifier: Quantity: 1 CPT4 Code: Description: ICD-10 Diagnosis Description T81.31XA Disruption of external operation (surgical) wound, not elsewhere classifi L97.822 Non-pressure chronic ulcer of other part of left lower leg with fat layer I73.9 Peripheral vascular disease, unspecified  I89.0 Lymphedema, not elsewhere classified Modifier: ed, initial encounter exposed Quantity: Electronic Signature(s) Signed: 10/07/2021 10:16:13 AM By: Worthy Keeler PA-C Entered By: Worthy Keeler on 10/07/2021 10:16:13

## 2021-10-07 NOTE — Progress Notes (Signed)
TYMIRA, HORKEY ([id number]) Visit Report for 10/07/2021 Abuse Risk Screen Details Patient Name: Kimberly [name], Kimberly [name] Date of Service: 10/07/2021 8:45 AM Medical Record Number: [id number] Patient Account Number: [id number] Date of Birth/Sex: Sep 08, 1945 (76 y.o. F) Treating RN: [name] Primary Care Mionna Advincula: Cathlean Cower Other Clinician: Referring Bobi Daudelin: Self, Referral Treating Pablo Stauffer/Extender: Skipper Cliche in Treatment: 0 Abuse Risk Screen Items Answer ABUSE RISK SCREEN: Has anyone close to you tried to hurt or harm you recentlyo No Do you feel uncomfortable with anyone in your familyo No Has anyone forced you do things that you didnot want to doo No Electronic Signature(s) Signed: 10/07/2021 2:31:54 PM By: [name] RN Entered By: [name] on 10/07/2021 08:56:51 Kimberly [name] ([id number]) -------------------------------------------------------------------------------- Activities of Daily Living Details Patient Name: Kimberly [name] Date of Service: 10/07/2021 8:45 AM Medical Record Number: [id number] Patient Account Number: [id number] Date of Birth/Sex: 1945-12-30 (76 y.o. F) Treating RN: [name] Primary Care Okie Jansson: Cathlean Cower Other Clinician: Referring Florella Mcneese: Self, Referral Treating Akili Cuda/Extender: Skipper Cliche in Treatment: 0 Activities of Daily Living Items Answer Activities of Daily Living (Please select one for each item) Drive Automobile Completely Able Take Medications Completely Able Use Telephone Completely Able Care for Appearance Completely Able Use Toilet Completely Able Bath / Shower Completely Able Dress Self Completely Able Feed Self Completely Able Walk Completely Able Get In / Out Bed Completely Able Housework Completely Able Prepare Meals Completely Able Handle Money Completely Able Shop for Self Completely Able Electronic Signature(s) Signed: 10/07/2021 2:31:54 PM By: [name] RN Entered By: [name] on  10/07/2021 08:57:11 Kimberly [name] ([id number]) -------------------------------------------------------------------------------- Education Screening Details Patient Name: Kimberly [name] Date of Service: 10/07/2021 8:45 AM Medical Record Number: [id number] Patient Account Number: [id number] Date of Birth/Sex: 11-22-1945 (76 y.o. F) Treating RN: [name] Primary Care Ferdinand Revoir: Cathlean Cower Other Clinician: Referring Rio Kidane: Self, Referral Treating Beverly Suriano/Extender: Skipper Cliche in Treatment: 0 Primary Learner Assessed: Patient Learning Preferences/Education Level/Primary Language Learning Preference: Explanation Highest Education Level: High School Preferred Language: English Cognitive Barrier Language Barrier: No Translator Needed: No Memory Deficit: No Emotional Barrier: No Cultural/Religious Beliefs Affecting Medical Care: No Physical Barrier Impaired Vision: Yes Glasses Impaired Hearing: No Decreased Hand dexterity: No Knowledge/Comprehension Knowledge Level: Medium Comprehension Level: High Ability to understand written instructions: High Ability to understand verbal instructions: High Motivation Anxiety Level: Anxious Cooperation: Cooperative Education Importance: Acknowledges Need Interest in Health Problems: Asks Questions Perception: Coherent Willingness to Engage in Self-Management High Activities: Readiness to Engage in Self-Management High Activities: Electronic Signature(s) Signed: 10/07/2021 2:31:54 PM By: [name] RN Entered By: [name] on 10/07/2021 08:57:36 Kimberly [name] ([id number]) -------------------------------------------------------------------------------- Fall Risk Assessment Details Patient Name: Kimberly [name] Date of Service: 10/07/2021 8:45 AM Medical Record Number: [id number] Patient Account Number: [id number] Date of Birth/Sex: 10-31-1945 (76 y.o. F) Treating RN: [name] Primary Care Zyhir Cappella: Cathlean Cower Other  Clinician: Referring Tanairy Payeur: Self, Referral Treating Iyana Topor/Extender: Skipper Cliche in Treatment: 0 Fall Risk Assessment Items Have you had 2 or more falls in the last 12 monthso 0 No Have you had any fall that resulted in injury in the last 12 monthso 0 No FALLS RISK SCREEN History of falling - immediate or within 3 months 0 No Secondary diagnosis (Do you have 2 or more medical diagnoseso) 0 No Ambulatory aid None/bed rest/wheelchair/nurse 0 No Crutches/cane/walker 0 No Furniture 0 No Intravenous therapy Access/Saline/Heparin Lock 0 No Gait/Transferring Normal/ bed rest/ wheelchair 0 No Weak (short steps with  or without shuffle, stooped but able to lift head while walking, may 0 No seek support from furniture) Impaired (short steps with shuffle, may have difficulty arising from chair, head down, impaired 0 No balance) Mental Status Oriented to own ability 0 No Electronic Signature(s) Signed: 10/07/2021 2:31:54 PM By: [name] RN Entered By: [name] on 10/07/2021 08:57:55 Kimberly [name] ([id number]) -------------------------------------------------------------------------------- Foot Assessment Details Patient Name: Kimberly [name] Date of Service: 10/07/2021 8:45 AM Medical Record Number: [id number] Patient Account Number: [id number] Date of Birth/Sex: 01/18/1946 (76 y.o. F) Treating RN: [name] Primary Care Rei Contee: Cathlean Cower Other Clinician: Referring Yancey Pedley: Self, Referral Treating Romonia Yanik/Extender: Skipper Cliche in Treatment: 0 Foot Assessment Items Site Locations + = Sensation present, - = Sensation absent, C = Callus, U = Ulcer R = Redness, W = Warmth, M = Maceration, PU = Pre-ulcerative lesion F = Fissure, S = Swelling, D = Dryness Assessment Right: Left: Other Deformity: No No Prior Foot Ulcer: No No Prior Amputation: No No Charcot Joint: No No Ambulatory Status: Ambulatory Without Help Gait: Steady Electronic  Signature(s) Signed: 10/07/2021 2:31:54 PM By: [name] RN Entered By: [name] on 10/07/2021 09:12:36 Kimberly [name] ([id number]) -------------------------------------------------------------------------------- Nutrition Risk Screening Details Patient Name: Kimberly [name] Date of Service: 10/07/2021 8:45 AM Medical Record Number: [id number] Patient Account Number: [id number] Date of Birth/Sex: 1945/12/06 (76 y.o. F) Treating RN: [name] Primary Care Nayra Coury: Cathlean Cower Other Clinician: Referring Kenyada Dosch: Self, Referral Treating Deoni Cosey/Extender: Skipper Cliche in Treatment: 0 Height (in): 61 Weight (lbs): 106 Body Mass Index (BMI): 20 Nutrition Risk Screening Items Score Screening NUTRITION RISK SCREEN: I have an illness or condition that made me change the kind and/or amount of food I eat 0 No I eat fewer than two meals per day 0 No I eat few fruits and vegetables, or milk products 0 No I have three or more drinks of beer, liquor or wine almost every day 0 No I have tooth or mouth problems that make it hard for me to eat 0 No I don't always have enough money to buy the food I need 0 No I eat alone most of the time 0 No I take three or more different prescribed or over-the-counter drugs a day 1 Yes Without wanting to, I have lost or gained 10 pounds in the last six months 0 No I am not always physically able to shop, cook and/or feed myself 0 No Nutrition Protocols Good Risk Protocol 0 No interventions needed Moderate Risk Protocol High Risk Proctocol Risk Level: Good Risk Score: 1 Electronic Signature(s) Signed: 10/07/2021 2:31:54 PM By: [name] RN Entered By: [name] on 10/07/2021 08:58:02

## 2021-10-07 NOTE — Progress Notes (Signed)
NYOMIE, EHRLICH ([id number]) Visit Report for 10/07/2021 Allergy List Details Patient Name: Kimberly [name], Kimberly [name] Date of Service: 10/07/2021 8:45 AM Medical Record Number: [id number] Patient Account Number: [id number] Date of Birth/Sex: January 17, 1946 (76 y.o. F) Treating RN: [name] Primary Care Pierina Schuknecht: Cathlean Cower Other Clinician: Referring Taitum Menton: Self, Referral Treating Oree Mirelez/Extender: Skipper Cliche in Treatment: 0 Allergies Active Allergies tape, permeable adhesive codeine Allergy Notes Electronic Signature(s) Signed: 10/07/2021 2:31:54 PM By: [name] RN Entered By: [name] on 10/07/2021 08:54:34 Kimberly [name] ([id number]) -------------------------------------------------------------------------------- Arrival Information Details Patient Name: Kimberly [name] Date of Service: 10/07/2021 8:45 AM Medical Record Number: [id number] Patient Account Number: [id number] Date of Birth/Sex: March 26, 1945 (76 y.o. F) Treating RN: [name] Primary Care Cristiana Yochim: Cathlean Cower Other Clinician: Referring Kilan Banfill: Self, Referral Treating Thai Burgueno/Extender: Skipper Cliche in Treatment: 0 Visit Information Patient Arrived: Ambulatory Arrival Time: 08:43 Accompanied By: husband Transfer Assistance: None Patient Identification Verified: Yes Secondary Verification Process Completed: Yes Patient Requires Transmission-Based Precautions: No Patient Has Alerts: No History Since Last Visit All ordered tests and consults were completed: No Added or deleted any medications: No Any new allergies or adverse reactions: No Had a fall or experienced change in activities of daily living that may affect risk of falls: No Signs or symptoms of abuse/neglect since last visito No Hospitalized since last visit: No Implantable device outside of the clinic excluding cellular tissue based products placed in the center since last visit: No Has Dressing in Place as Prescribed: Yes Electronic  Signature(s) Signed: 10/07/2021 2:31:54 PM By: [name] RN Entered By: [name] on 10/07/2021 08:48:56 Kimberly [name] ([id number]) -------------------------------------------------------------------------------- Clinic Level of Care Assessment Details Patient Name: Kimberly [name] Date of Service: 10/07/2021 8:45 AM Medical Record Number: [id number] Patient Account Number: [id number] Date of Birth/Sex: August 09, 1945 (76 y.o. F) Treating RN: [name] Primary Care Grae Leathers: Cathlean Cower Other Clinician: Referring Torianne Laflam: Self, Referral Treating Lil Lepage/Extender: Skipper Cliche in Treatment: 0 Clinic Level of Care Assessment Items TOOL 1 Quantity Score '[]'  - Use when EandM and Procedure is performed on INITIAL visit 0 ASSESSMENTS - Nursing Assessment / Reassessment '[]'  - General Physical Exam (combine w/ comprehensive assessment (listed just below) when performed on new 0 pt. evals) '[]'  - 0 Comprehensive Assessment (HX, ROS, Risk Assessments, Wounds Hx, etc.) ASSESSMENTS - Wound and Skin Assessment / Reassessment '[]'  - Dermatologic / Skin Assessment (not related to wound area) 0 ASSESSMENTS - Ostomy and/or Continence Assessment and Care '[]'  - Incontinence Assessment and Management 0 '[]'  - 0 Ostomy Care Assessment and Management (repouching, etc.) PROCESS - Coordination of Care '[]'  - Simple Patient / Family Education for ongoing care 0 '[]'  - 0 Complex (extensive) Patient / Family Education for ongoing care '[]'  - 0 Staff obtains Programmer, systems, Records, Test Results / Process Orders '[]'  - 0 Staff telephones HHA, Nursing Homes / Clarify orders / etc '[]'  - 0 Routine Transfer to another Facility (non-emergent condition) '[]'  - 0 Routine Hospital Admission (non-emergent condition) '[]'  - 0 New Admissions / Biomedical engineer / Ordering NPWT, Apligraf, etc. '[]'  - 0 Emergency Hospital Admission (emergent condition) PROCESS - Special Needs '[]'  - Pediatric / Minor Patient Management 0 '[]'  -  0 Isolation Patient Management '[]'  - 0 Hearing / Language / Visual special needs '[]'  - 0 Assessment of Community assistance (transportation, D/C planning, etc.) '[]'  - 0 Additional assistance / Altered mentation '[]'  - 0 Support Surface(s) Assessment (bed, cushion, seat, etc.) INTERVENTIONS - Miscellaneous '[]'  - External ear exam 0 '[]'  -  0 Patient Transfer (multiple staff / Civil Service fast streamer / Similar devices) '[]'  - 0 Simple Staple / Suture removal (25 or less) '[]'  - 0 Complex Staple / Suture removal (26 or more) '[]'  - 0 Hypo/Hyperglycemic Management (do not check if billed separately) '[]'  - 0 Ankle / Brachial Index (ABI) - do not check if billed separately Has the patient been seen at the hospital within the last three years: Yes Total Score: 0 Level Of Care: ____ Kimberly [name] ([id number]) Electronic Signature(s) Signed: 10/07/2021 2:31:54 PM By: [name] RN Entered By: [name] on 10/07/2021 09:38:01 Kimberly [name] ([id number]) -------------------------------------------------------------------------------- Encounter Discharge Information Details Patient Name: Kimberly [name] Date of Service: 10/07/2021 8:45 AM Medical Record Number: [id number] Patient Account Number: [id number] Date of Birth/Sex: May 22, 1945 (76 y.o. F) Treating RN: [name] Primary Care Hanan Moen: Cathlean Cower Other Clinician: Referring Tarry Fountain: Self, Referral Treating Shana Younge/Extender: Skipper Cliche in Treatment: 0 Encounter Discharge Information Items Post Procedure Vitals Discharge Condition: Stable Temperature (F): 98.1 Ambulatory Status: Ambulatory Pulse (bpm): 60 Discharge Destination: Home Respiratory Rate (breaths/min): 16 Transportation: Private Auto Blood Pressure (mmHg): 113/61 Accompanied By: husband Schedule Follow-up Appointment: Yes Clinical Summary of Care: Electronic Signature(s) Signed: 10/07/2021 2:31:54 PM By: [name] RN Entered By: [name] on 10/07/2021  09:39:37 Kimberly [name] ([id number]) -------------------------------------------------------------------------------- Lower Extremity Assessment Details Patient Name: Kimberly [name] Date of Service: 10/07/2021 8:45 AM Medical Record Number: [id number] Patient Account Number: [id number] Date of Birth/Sex: 1945/08/10 (76 y.o. F) Treating RN: [name] Primary Care Donni Oglesby: Cathlean Cower Other Clinician: Referring Bosco Paparella: Self, Referral Treating Sherry Rogus/Extender: Skipper Cliche in Treatment: 0 Edema Assessment Assessed: [Left: No] [Right: No] Edema: [Left: Ye] [Right: s] Calf Left: Right: Point of Measurement: 30 cm From Medial Instep 29 cm Ankle Left: Right: Point of Measurement: 10 cm From Medial Instep 20 cm Knee To Floor Left: Right: From Medial Instep 40 cm Vascular Assessment Pulses: Dorsalis Pedis Palpable: [Left:Yes] Doppler Audible: [Left:Yes] Blood Pressure: Brachial: [Left:113] Ankle: [Left:Dorsalis Pedis: 60 0.53] Electronic Signature(s) Signed: 10/07/2021 2:31:54 PM By: [name] RN Entered By: [name] on 10/07/2021 09:17:18 Kimberly [name] ([id number]) -------------------------------------------------------------------------------- Multi Wound Chart Details Patient Name: Kimberly [name] Date of Service: 10/07/2021 8:45 AM Medical Record Number: [id number] Patient Account Number: [id number] Date of Birth/Sex: May 24, 1945 (76 y.o. F) Treating RN: [name] Primary Care Marcile Fuquay: Cathlean Cower Other Clinician: Referring Anjenette Gerbino: Self, Referral Treating Seema Blum/Extender: Skipper Cliche in Treatment: 0 Vital Signs Height(in): 61 Pulse(bpm): 60 Weight(lbs): 106 Blood Pressure(mmHg): 113/61 Body Mass Index(BMI): 20 Temperature(F): 98.1 Respiratory Rate(breaths/min): 16 Photos: [N/A:N/A] Wound Location: Left, Lateral Lower Leg N/A N/A Wounding Event: Surgical Injury N/A N/A Primary Etiology: Dehisced Wound N/A N/A Comorbid History: Anemia,  Asthma, Chronic N/A N/A Obstructive Pulmonary Disease (COPD), Congestive Heart Failure, Hypertension, Myocardial Infarction, Peripheral Arterial Disease Date Acquired: 04/03/2021 N/A N/A Weeks of Treatment: 0 N/A N/A Wound Status: Open N/A N/A Wound Recurrence: No N/A N/A Measurements L x W x D (cm) 0.7x3x0.3 N/A N/A Area (cm) : 1.649 N/A N/A Volume (cm) : 0.495 N/A N/A Classification: Full Thickness Without Exposed N/A N/A Support Structures Exudate Amount: Medium N/A N/A Exudate Type: Serosanguineous N/A N/A Exudate Color: red, brown N/A N/A Granulation Amount: None Present (0%) N/A N/A Necrotic Amount: Large (67-100%) N/A N/A Exposed Structures: Fascia: No N/A N/A Fat Layer (Subcutaneous Tissue): No Tendon: No Muscle: No Joint: No Bone: No Epithelialization: None N/A N/A Treatment Notes Electronic Signature(s) Signed: 10/07/2021 2:31:54 PM By: [name] RN  Entered By: [name] on 10/07/2021 09:31:27 Kimberly [name] ([id number]) -------------------------------------------------------------------------------- Multi-Disciplinary Care Plan Details Patient Name: Kimberly [name] Date of Service: 10/07/2021 8:45 AM Medical Record Number: [id number] Patient Account Number: [id number] Date of Birth/Sex: 1945/08/18 (76 y.o. F) Treating RN: [name] Primary Care Elliet Goodnow: Cathlean Cower Other Clinician: Referring Floretta Petro: Self, Referral Treating Anshu Wehner/Extender: Skipper Cliche in Treatment: 0 Active Inactive Wound/Skin Impairment Nursing Diagnoses: Knowledge deficit related to ulceration/compromised skin integrity Goals: Patient/caregiver will verbalize understanding of skin care regimen Date Initiated: 10/07/2021 Target Resolution Date: 11/06/2021 Goal Status: Active Ulcer/skin breakdown will have a volume reduction of 30% by week 4 Date Initiated: 10/07/2021 Target Resolution Date: 11/06/2021 Goal Status: Active Ulcer/skin breakdown will have a volume  reduction of 50% by week 8 Date Initiated: 10/07/2021 Target Resolution Date: 12/07/2021 Goal Status: Active Ulcer/skin breakdown will have a volume reduction of 80% by week 12 Date Initiated: 10/07/2021 Target Resolution Date: 01/06/2022 Goal Status: Active Ulcer/skin breakdown will heal within 14 weeks Date Initiated: 10/07/2021 Target Resolution Date: 02/06/2022 Goal Status: Active Interventions: Assess patient/caregiver ability to obtain necessary supplies Assess patient/caregiver ability to perform ulcer/skin care regimen upon admission and as needed Assess ulceration(s) every visit Notes: Electronic Signature(s) Signed: 10/07/2021 2:31:54 PM By: [name] RN Entered By: [name] on 10/07/2021 [place], Kimberly B. ([id number]) -------------------------------------------------------------------------------- Pain Assessment Details Patient Name: Kimberly [name] Date of Service: 10/07/2021 8:45 AM Medical Record Number: [id number] Patient Account Number: [id number] Date of Birth/Sex: 09/02/1945 (76 y.o. F) Treating RN: [name] Primary Care Cariann Kinnamon: Cathlean Cower Other Clinician: Referring Shaw Dobek: Self, Referral Treating Ertha Nabor/Extender: Skipper Cliche in Treatment: 0 Active Problems Location of Pain Severity and Description of Pain Patient Has Paino Yes Site Locations With Dressing Change: Yes Duration of the Pain. Constant / Intermittento Intermittent How Long Does it Lasto Hours: Minutes: 15 Rate the pain. Current Pain Level: 2 Worst Pain Level: 4 Least Pain Level: 0 Tolerable Pain Level: 5 Character of Pain Describe the Pain: Shooting Pain Management and Medication Current Pain Management: Medication: Yes Cold Application: No Rest: Yes Massage: No Activity: No T.E.N.S.: No Heat Application: No Leg drop or elevation: No Is the Current Pain Management Adequate: Inadequate How does your wound impact your activities of daily livingo Sleep:  No Bathing: No Appetite: No Relationship With Others: No Bladder Continence: No Emotions: No Bowel Continence: No Work: No Toileting: No Drive: No Dressing: No Hobbies: No Electronic Signature(s) Signed: 10/07/2021 2:31:54 PM By: [name] RN Entered By: [name] on 10/07/2021 08:52:54 Kimberly [name] ([id number]) -------------------------------------------------------------------------------- Patient/Caregiver Education Details Patient Name: Kimberly [name] Date of Service: 10/07/2021 8:45 AM Medical Record Number: [id number] Patient Account Number: [id number] Date of Birth/Gender: 1945/09/14 (76 y.o. F) Treating RN: [name] Primary Care Physician: Cathlean Cower Other Clinician: Referring Physician: Self, Referral Treating Physician/Extender: Skipper Cliche in Treatment: 0 Education Assessment Education Provided To: Patient Education Topics Provided Wound/Skin Impairment: Methods: Explain/Verbal Responses: State content correctly Electronic Signature(s) Signed: 10/07/2021 2:31:54 PM By: [name] RN Entered By: [name] on 10/07/2021 09:38:15 Kimberly [name] ([id number]) -------------------------------------------------------------------------------- Wound Assessment Details Patient Name: Kimberly [name] Date of Service: 10/07/2021 8:45 AM Medical Record Number: [id number] Patient Account Number: [id number] Date of Birth/Sex: [birth year] (76 y.o. F) Treating RN: [name] Primary Care Xuan Mateus: Cathlean Cower Other Clinician: Referring Makyah Lavigne: Self, Referral Treating Maddix Heinz/Extender: Skipper Cliche in Treatment: 0 Wound Status Wound Number: 4 Primary Dehisced Wound Etiology: Wound Location: Left, Lateral Lower Leg Wound Open Wounding  Event: Surgical Injury Status: Date Acquired: 04/03/2021 Comorbid Anemia, Asthma, Chronic Obstructive Pulmonary Disease Weeks Of Treatment: 0 History: (COPD), Congestive Heart Failure, Hypertension, Clustered  Wound: No Myocardial Infarction, Peripheral Arterial Disease Photos Wound Measurements Length: (cm) 0.7 Width: (cm) 3 Depth: (cm) 0.3 Area: (cm) 1.649 Volume: (cm) 0.495 % Reduction in Area: % Reduction in Volume: Epithelialization: None Tunneling: No Undermining: No Wound Description Classification: Full Thickness Without Exposed Support Structu Exudate Amount: Medium Exudate Type: Serosanguineous Exudate Color: red, brown res Foul Odor After Cleansing: No Slough/Fibrino Yes Wound Bed Granulation Amount: None Present (0%) Exposed Structure Necrotic Amount: Large (67-100%) Fascia Exposed: No Necrotic Quality: Adherent Slough Fat Layer (Subcutaneous Tissue) Exposed: No Tendon Exposed: No Muscle Exposed: No Joint Exposed: No Bone Exposed: No Treatment Notes Wound #4 (Lower Leg) Wound Laterality: Left, Lateral Cleanser [place] Ancillary Kit - 15 Day Supply Discharge Instruction: Use supplies as instructed; Kit contains: (15) Saline Bullets; (15) 3x3 Gauze; 15 pr Gloves Soap and [address] Kimberly [name], Kimberly [name] ([id number]) Discharge Instruction: Gently cleanse wound with antibacterial soap, rinse and pat dry prior to dressing wounds Peri-Wound Care Topical Primary Dressing Medihoney - Gel, 1.5 (oz), tube Secondary Dressing ABD Pad 5x9 (in/in) Discharge Instruction: Cover with ABD pad Secured With Stretch Net Dressing, Latex-free, Size 5, Small-Head / Shoulder / Thigh Compression Wrap Compression Stockings Add-Ons Electronic Signature(s) Signed: 10/07/2021 2:31:54 PM By: [name] RN Entered By: [name] on 10/07/2021 09:15:28 Kimberly [name] ([id number]) -------------------------------------------------------------------------------- Vitals Details Patient Name: Kimberly [name] Date of Service: 10/07/2021 8:45 AM Medical Record Number: [id number] Patient Account Number: [id number] Date of Birth/Sex: 06-16-1945 (76 y.o. F) Treating RN: [name] Primary Care  Daisi Kentner: Cathlean Cower Other Clinician: Referring Bentzion Dauria: Self, Referral Treating Dandre Sisler/Extender: Skipper Cliche in Treatment: 0 Vital Signs Time Taken: 08:50 Temperature (F): 98.1 Height (in): 61 Pulse (bpm): 60 Source: Stated Respiratory Rate (breaths/min): 16 Weight (lbs): 106 Blood Pressure (mmHg): 113/61 Source: Stated Reference Range: 80 - 120 mg / dl Body Mass Index (BMI): 20 Electronic Signature(s) Signed: 10/07/2021 2:31:54 PM By: [name] RN Entered By: [name] on 10/07/2021 08:53:29

## 2021-10-08 DIAGNOSIS — T8131XA Disruption of external operation (surgical) wound, not elsewhere classified, initial encounter: Secondary | ICD-10-CM | POA: Diagnosis not present

## 2021-10-08 DIAGNOSIS — L97822 Non-pressure chronic ulcer of other part of left lower leg with fat layer exposed: Secondary | ICD-10-CM | POA: Diagnosis not present

## 2021-10-11 NOTE — Progress Notes (Signed)
Remote ICD transmission.   

## 2021-10-21 ENCOUNTER — Encounter: Payer: Medicare Other | Attending: Physician Assistant | Admitting: Physician Assistant

## 2021-10-21 DIAGNOSIS — I890 Lymphedema, not elsewhere classified: Secondary | ICD-10-CM | POA: Insufficient documentation

## 2021-10-21 DIAGNOSIS — I110 Hypertensive heart disease with heart failure: Secondary | ICD-10-CM | POA: Diagnosis not present

## 2021-10-21 DIAGNOSIS — F17218 Nicotine dependence, cigarettes, with other nicotine-induced disorders: Secondary | ICD-10-CM | POA: Diagnosis not present

## 2021-10-21 DIAGNOSIS — J449 Chronic obstructive pulmonary disease, unspecified: Secondary | ICD-10-CM | POA: Diagnosis not present

## 2021-10-21 DIAGNOSIS — E785 Hyperlipidemia, unspecified: Secondary | ICD-10-CM | POA: Insufficient documentation

## 2021-10-21 DIAGNOSIS — L97822 Non-pressure chronic ulcer of other part of left lower leg with fat layer exposed: Secondary | ICD-10-CM | POA: Diagnosis not present

## 2021-10-21 DIAGNOSIS — I509 Heart failure, unspecified: Secondary | ICD-10-CM | POA: Diagnosis not present

## 2021-10-21 DIAGNOSIS — Z9049 Acquired absence of other specified parts of digestive tract: Secondary | ICD-10-CM | POA: Insufficient documentation

## 2021-10-21 DIAGNOSIS — X58XXXA Exposure to other specified factors, initial encounter: Secondary | ICD-10-CM | POA: Insufficient documentation

## 2021-10-21 DIAGNOSIS — F17210 Nicotine dependence, cigarettes, uncomplicated: Secondary | ICD-10-CM | POA: Insufficient documentation

## 2021-10-21 DIAGNOSIS — I420 Dilated cardiomyopathy: Secondary | ICD-10-CM | POA: Insufficient documentation

## 2021-10-21 DIAGNOSIS — T8131XA Disruption of external operation (surgical) wound, not elsewhere classified, initial encounter: Secondary | ICD-10-CM | POA: Diagnosis not present

## 2021-10-21 DIAGNOSIS — Z8774 Personal history of (corrected) congenital malformations of heart and circulatory system: Secondary | ICD-10-CM | POA: Insufficient documentation

## 2021-10-21 DIAGNOSIS — I739 Peripheral vascular disease, unspecified: Secondary | ICD-10-CM | POA: Insufficient documentation

## 2021-10-21 NOTE — Progress Notes (Addendum)
SHANYLA, MARCONI ([id number]) Visit Report for 10/21/2021 Arrival Information Details Patient Name: Kimberly [name], Kimberly [name] Date of Service: 10/21/2021 10:15 AM Medical Record Number: [id number] Patient Account Number: [id number] Date of Birth/Sex: 1945-02-27 (76 y.o. F) Treating RN: [name] Primary Care Lynann Demetrius: Cathlean Cower Other Clinician: Referring Oswell Say: Cathlean Cower Treating Renly Roots/Extender: Skipper Cliche in Treatment: 2 Visit Information History Since Last Visit All ordered tests and consults were completed: No Patient Arrived: Ambulatory Added or deleted any medications: No Arrival Time: 10:09 Any new allergies or adverse reactions: No Accompanied By: self Had a fall or experienced change in No Transfer Assistance: None activities of daily living that may affect Patient Identification Verified: Yes risk of falls: Secondary Verification Process Completed: Yes Signs or symptoms of abuse/neglect since last visito No Patient Requires Transmission-Based Precautions: No Hospitalized since last visit: No Patient Has Alerts: No Implantable device outside of the clinic excluding No cellular tissue based products placed in the center since last visit: Has Dressing in Place as Prescribed: Yes Pain Present Now: No Electronic Signature(s) Signed: 10/23/2021 11:46:40 AM By: [name] RN Entered By: [name] on 10/21/2021 10:13:58 [name] ([id number]) -------------------------------------------------------------------------------- Clinic Level of Care Assessment Details Patient Name: [name] Date of Service: 10/21/2021 10:15 AM Medical Record Number: [id number] Patient Account Number: [id number] Date of Birth/Sex: 10/16/1945 (76 y.o. F) Treating RN: [name] Primary Care Audra Bellard: Cathlean Cower Other Clinician: Referring Fanta Wimberley: Cathlean Cower Treating Tonae Livolsi/Extender: Skipper Cliche in Treatment: 2 Clinic Level of Care Assessment Items TOOL 4 Quantity  Score X - Use when only an EandM is performed on FOLLOW-UP visit 1 0 ASSESSMENTS - Nursing Assessment / Reassessment X - Reassessment of Co-morbidities (includes updates in patient status) 1 10 X- 1 5 Reassessment of Adherence to Treatment Plan ASSESSMENTS - Wound and Skin Assessment / Reassessment X - Simple Wound Assessment / Reassessment - one wound 1 5 _0  - 0 Complex Wound Assessment / Reassessment - multiple wounds _1  - 0 Dermatologic / Skin Assessment (not related to wound area) ASSESSMENTS - Focused Assessment _2  - Circumferential Edema Measurements - multi extremities 0 _3  - 0 Nutritional Assessment / Counseling / Intervention _4  - 0 Lower Extremity Assessment (monofilament, tuning fork, pulses) _5  - 0 Peripheral Arterial Disease Assessment (using hand held doppler) ASSESSMENTS - Ostomy and/or Continence Assessment and Care _6  - Incontinence Assessment and Management 0 _7  - 0 Ostomy Care Assessment and Management (repouching, etc.) PROCESS - Coordination of Care X - Simple Patient / Family Education for ongoing care 1 15 _8  - 0 Complex (extensive) Patient / Family Education for ongoing care _9  - 0 Staff obtains Programmer, systems, Records, Test Results / Process Orders _10  - 0 Staff telephones HHA, Nursing Homes / Clarify orders / etc _11  - 0 Routine Transfer to another Facility (non-emergent condition) _12  - 0 Routine Hospital Admission (non-emergent condition) _13  - 0 New Admissions / Biomedical engineer / Ordering NPWT, Apligraf, etc. _14  - 0 Emergency Hospital Admission (emergent condition) X- 1 10 Simple Discharge Coordination _15  - 0 Complex (extensive) Discharge Coordination PROCESS - Special Needs _16  - Pediatric / Minor Patient Management 0 _17  - 0 Isolation Patient Management _18  - 0 Hearing / Language / Visual special needs _19  - 0 Assessment of Community assistance (transportation, D/C planning, etc.) _20  - 0 Additional assistance / Altered mentation _21  -  0 Support Surface(s) Assessment (bed, cushion, seat, etc.) INTERVENTIONS - Wound Cleansing / Measurement Brucato, Mosella B. ([id number]) X- 1 5 Simple Wound Cleansing -  one wound _0  - 0 Complex Wound Cleansing - multiple wounds X- 1 5 Wound Imaging (photographs - any number of wounds) _1  - 0 Wound Tracing (instead of photographs) X- 1 5 Simple Wound Measurement - one wound _2  - 0 Complex Wound Measurement - multiple wounds INTERVENTIONS - Wound Dressings X - Small Wound Dressing one or multiple wounds 1 10 _3  - 0 Medium Wound Dressing one or multiple wounds _4  - 0 Large Wound Dressing one or multiple wounds <YNWGNFAOZHYQMVHQ>_4<\/ONGEXBMWUXLKGMWN>_0  - 0 Application of Medications - topical <UVOZDGUYQIHKVQQV>_9<\/DGLOVFIEPPIRJJOA>_4  - 0 Application of Medications - injection INTERVENTIONS - Miscellaneous _7  - External ear exam 0 _8  - 0 Specimen Collection (cultures, biopsies, blood, body fluids, etc.) _9  - 0 Specimen(s) / Culture(s) sent or taken to Lab for analysis _10  - 0 Patient Transfer (multiple staff / Civil Service fast streamer / Similar devices) _11  - 0 Simple Staple / Suture removal (25 or less) _12  - 0 Complex Staple / Suture removal (26 or more) _13  - 0 Hypo / Hyperglycemic Management (close monitor of Blood Glucose) _14  - 0 Ankle / Brachial Index (ABI) - do not check if billed separately X- 1 5 Vital Signs Has the patient been seen at the hospital within the last three years: Yes Total Score: 75 Level Of Care: New/Established - Level 2 Electronic Signature(s) Signed: 10/23/2021 11:46:40 AM By: [name] RN Entered By: [name] on 10/21/2021 10:29:46 [name] ([id number]) -------------------------------------------------------------------------------- Encounter Discharge Information Details Patient Name: [name] Date of Service: 10/21/2021 10:15 AM Medical Record Number: [id number] Patient Account Number: [id number] Date of Birth/Sex: 11/29/1945 (76 y.o. F) Treating RN: [name] Primary Care Timohty Renbarger: Cathlean Cower Other  Clinician: Referring Skyah Hannon: Cathlean Cower Treating Jerika Wales/Extender: Skipper Cliche in Treatment: 2 Encounter Discharge Information Items Discharge Condition: Stable Ambulatory Status: Ambulatory Discharge Destination: Home Transportation: Private Auto Accompanied By: self Schedule Follow-up Appointment: Yes Clinical Summary of Care: Electronic Signature(s) Signed: 10/23/2021 11:46:40 AM By: [name] RN Entered By: [name] on 10/21/2021 10:30:44 [name] ([id number]) -------------------------------------------------------------------------------- Lower Extremity Assessment Details Patient Name: [name] Date of Service: 10/21/2021 10:15 AM Medical Record Number: [id number] Patient Account Number: [id number] Date of Birth/Sex: 31-Aug-1945 (76 y.o. F) Treating RN: [name] Primary Care Keni Elison: Cathlean Cower Other Clinician: Referring Toney Lizaola: Cathlean Cower Treating Allenmichael Mcpartlin/Extender: Skipper Cliche in Treatment: 2 Edema Assessment Assessed: [Left: No] [Right: No] Edema: [Left: Ye] [Right: s] Calf Left: Right: Point of Measurement: 30 cm From Medial Instep 29 cm Ankle Left: Right: Point of Measurement: 10 cm From Medial Instep 20 cm Knee To Floor Left: Right: From Medial Instep 40 cm Vascular Assessment Pulses: Dorsalis Pedis Palpable: [Left:Yes] Electronic Signature(s) Signed: 10/23/2021 11:46:40 AM By: [name] RN Entered By: [name] on 10/21/2021 10:22:18 [name] ([id number]) -------------------------------------------------------------------------------- Multi Wound Chart Details Patient Name: [name] Date of Service: 10/21/2021 10:15 AM Medical Record Number: [id number] Patient Account Number: [id number] Date of Birth/Sex: 10-23-1945 (76 y.o. F) Treating RN: [name] Primary Care Niala Stcharles: Cathlean Cower Other Clinician: Referring Angelisse Riso: Cathlean Cower Treating Donzel Romack/Extender: Skipper Cliche in Treatment:  2 Vital Signs Height(in): 87 Pulse(bpm): 80 Weight(lbs): 106 Blood Pressure(mmHg): 107/52 Body Mass Index(BMI): 20 Temperature(F): 98.1 Respiratory Rate(breaths/min): 18 Photos: [N/A:N/A] Wound Location: Left, Lateral Lower Leg N/A N/A Wounding Event: Surgical Injury N/A N/A Primary Etiology: Dehisced Wound N/A N/A Comorbid History: Anemia, Asthma, Chronic N/A N/A Obstructive Pulmonary Disease (COPD), Congestive Heart Failure, Hypertension, Myocardial Infarction, Peripheral Arterial Disease Date Acquired: 04/03/2021 N/A N/A Suella [place]  of Treatment: 2 N/A N/A Wound Status: Open N/A N/A Wound Recurrence: No N/A N/A Measurements L x W x D (cm) 0.6x0.2x0.1 N/A N/A Area (cm) : 0.094 N/A N/A Volume (cm) : 0.009 N/A N/A % Reduction in Area: 94.30% N/A N/A % Reduction in Volume: 98.20% N/A N/A Classification: Full Thickness Without Exposed N/A N/A Support Structures Exudate Amount: Medium N/A N/A Exudate Type: Serosanguineous N/A N/A Exudate Color: red, brown N/A N/A Granulation Amount: Medium (34-66%) N/A N/A Necrotic Amount: Medium (34-66%) N/A N/A Exposed Structures: Fat Layer (Subcutaneous Tissue): N/A N/A Yes Fascia: No Tendon: No Muscle: No Joint: No Bone: No Epithelialization: None N/A N/A Treatment Notes Electronic Signature(s) Signed: 10/23/2021 11:46:40 AM By: [name] RN Entered By: [name] on 10/21/2021 10:22:29 [name] ([id number]) -------------------------------------------------------------------------------- Multi-Disciplinary Care Plan Details Patient Name: [name] Date of Service: 10/21/2021 10:15 AM Medical Record Number: [id number] Patient Account Number: [id number] Date of Birth/Sex: 1945-12-04 (76 y.o. F) Treating RN: [name] Primary Care Yamen Castrogiovanni: Cathlean Cower Other Clinician: Referring Tonnie Stillman: Cathlean Cower Treating Liat Mayol/Extender: Skipper Cliche in Treatment: 2 Active Inactive Wound/Skin Impairment Nursing  Diagnoses: Knowledge deficit related to ulceration/compromised skin integrity Goals: Patient/caregiver will verbalize understanding of skin care regimen Date Initiated: 10/07/2021 Target Resolution Date: 11/06/2021 Goal Status: Active Ulcer/skin breakdown will have a volume reduction of 30% by week 4 Date Initiated: 10/07/2021 Target Resolution Date: 11/06/2021 Goal Status: Active Ulcer/skin breakdown will have a volume reduction of 50% by week 8 Date Initiated: 10/07/2021 Target Resolution Date: 12/07/2021 Goal Status: Active Ulcer/skin breakdown will have a volume reduction of 80% by week 12 Date Initiated: 10/07/2021 Target Resolution Date: 01/06/2022 Goal Status: Active Ulcer/skin breakdown will heal within 14 weeks Date Initiated: 10/07/2021 Target Resolution Date: 02/06/2022 Goal Status: Active Interventions: Assess patient/caregiver ability to obtain necessary supplies Assess patient/caregiver ability to perform ulcer/skin care regimen upon admission and as needed Assess ulceration(s) every visit Notes: Electronic Signature(s) Signed: 10/23/2021 11:46:40 AM By: [name] RN Entered By: [name] on 10/21/2021 10:22:22 [name] ([id number]) -------------------------------------------------------------------------------- Pain Assessment Details Patient Name: [name] Date of Service: 10/21/2021 10:15 AM Medical Record Number: [id number] Patient Account Number: [id number] Date of Birth/Sex: 1945/09/22 (76 y.o. F) Treating RN: [name] Primary Care Legend Pecore: Cathlean Cower Other Clinician: Referring Herby Amick: Cathlean Cower Treating Kenidy Crossland/Extender: Skipper Cliche in Treatment: 2 Active Problems Location of Pain Severity and Description of Pain Patient Has Paino No Site Locations Pain Management and Medication Current Pain Management: Electronic Signature(s) Signed: 10/23/2021 11:46:40 AM By: [name] RN Entered By: [name] on 10/21/2021  10:14:50 [name] ([id number]) -------------------------------------------------------------------------------- Patient/Caregiver Education Details Patient Name: [name] Date of Service: 10/21/2021 10:15 AM Medical Record Number: [id number] Patient Account Number: [id number] Date of Birth/Gender: 1945/11/02 (76 y.o. F) Treating RN: [name] Primary Care Physician: Cathlean Cower Other Clinician: Referring Physician: Cathlean Cower Treating Physician/Extender: Skipper Cliche in Treatment: 2 Education Assessment Education Provided To: Patient Education Topics Provided Wound/Skin Impairment: Methods: Explain/Verbal Responses: State content correctly Electronic Signature(s) Signed: 10/23/2021 11:46:40 AM By: [name] RN Entered By: [name] on 10/21/2021 10:30:00 [name] ([id number]) -------------------------------------------------------------------------------- Wound Assessment Details Patient Name: [name] Date of Service: 10/21/2021 10:15 AM Medical Record Number: [id number] Patient Account Number: [id number] Date of Birth/Sex: 1945/07/27 (76 y.o. F) Treating RN: [name] Primary Care Leary Mcnulty: Cathlean Cower Other Clinician: Referring Rhea Kaelin: Cathlean Cower Treating Leibish Mcgregor/Extender: Skipper Cliche in Treatment: 2 Wound Status Wound Number: 4 Primary Dehisced  Wound Etiology: Wound Location: Left, Lateral Lower Leg Wound Open Wounding Event: Surgical Injury Status: Date Acquired: 04/03/2021 Comorbid Anemia, Asthma, Chronic Obstructive Pulmonary Disease Weeks Of Treatment: 2 History: (COPD), Congestive Heart Failure, Hypertension, Clustered Wound: No Myocardial Infarction, Peripheral Arterial Disease Photos Wound Measurements Length: (cm) 0.6 Width: (cm) 0.2 Depth: (cm) 0.1 Area: (cm) 0.094 Volume: (cm) 0.009 % Reduction in Area: 94.3% % Reduction in Volume: 98.2% Epithelialization: None Tunneling: No Undermining: No Wound  Description Classification: Full Thickness Without Exposed Support Structures Exudate Amount: Medium Exudate Type: Serosanguineous Exudate Color: red, brown Foul Odor After Cleansing: No Slough/Fibrino Yes Wound Bed Granulation Amount: Medium (34-66%) Exposed Structure Necrotic Amount: Medium (34-66%) Fascia Exposed: No Necrotic Quality: Adherent Slough Fat Layer (Subcutaneous Tissue) Exposed: Yes Tendon Exposed: No Muscle Exposed: No Joint Exposed: No Bone Exposed: No Treatment Notes Wound #4 (Lower Leg) Wound Laterality: Left, Lateral Cleanser [place] Ancillary Kit - 15 Day Supply Discharge Instruction: Use supplies as instructed; Kit contains: (15) Saline Bullets; (15) 3x3 Gauze; 15 pr Gloves Soap and [address] DEVORY, MCKINZIE ([id number]) Discharge Instruction: Gently cleanse wound with antibacterial soap, rinse and pat dry prior to dressing wounds Peri-Wound Care Topical Primary Dressing Medihoney - Gel, 1.5 (oz), tube Secondary Dressing ABD Pad 5x9 (in/in) Discharge Instruction: Cover with ABD pad Secured With Stretch Net Dressing, Latex-free, Size 5, Small-Head / Shoulder / Thigh Compression Wrap Compression Stockings Add-Ons Electronic Signature(s) Signed: 10/23/2021 11:46:40 AM By: [name] RN Entered By: [name] on 10/21/2021 10:20:52 [name] ([id number]) -------------------------------------------------------------------------------- Vitals Details Patient Name: [name] Date of Service: 10/21/2021 10:15 AM Medical Record Number: [id number] Patient Account Number: [id number] Date of Birth/Sex: 02-20-1945 (76 y.o. F) Treating RN: [name] Primary Care Ellan Tess: Cathlean Cower Other Clinician: Referring Bianco Cange: Cathlean Cower Treating Brinkley Peet/Extender: Skipper Cliche in Treatment: 2 Vital Signs Time Taken: 10:14 Temperature (F): 98.1 Height (in): 61 Pulse (bpm): 64 Weight (lbs): 106 Respiratory Rate (breaths/min): 18 Body Mass Index  (BMI): 20 Blood Pressure (mmHg): 107/52 Reference Range: 80 - 120 mg / dl Electronic Signature(s) Signed: 10/23/2021 11:46:40 AM By: [name] RN Entered By: [name] on 10/21/2021 10:14:45

## 2021-10-21 NOTE — Progress Notes (Signed)
Kimberly [name], Kimberly [name] ([id number]) Visit Report for 10/21/2021 Chief Complaint Document Details Patient Name: Kimberly [name], Kimberly [name] Date of Service: 10/21/2021 10:15 AM Medical Record Number: [id number] Patient Account Number: [id number] Date of Birth/Sex: 07-Apr-1945 (76 y.o. F) Treating RN: [name] Primary Care Provider: Cathlean Cower Other Clinician: Referring Provider: Cathlean Cower Treating Provider/Extender: Skipper Cliche in Treatment: 2 Information Obtained from: Patient Chief Complaint Left LE surgical ulcer Electronic Signature(s) Signed: 10/21/2021 10:10:14 AM By: Worthy Keeler PA-C Entered By: Worthy Keeler on 10/21/2021 10:10:14 Kimberly [name] ([id number]) -------------------------------------------------------------------------------- Problem List Details Patient Name: Kimberly [name] Date of Service: 10/21/2021 10:15 AM Medical Record Number: [id number] Patient Account Number: [id number] Date of Birth/Sex: [birth year] (76 y.o. F) Treating RN: [name] Primary Care Provider: Cathlean Cower Other Clinician: Referring Provider: Cathlean Cower Treating Provider/Extender: Skipper Cliche in Treatment: 2 Active Problems ICD-10 Encounter Code Description Active Date MDM Diagnosis T81.31XA Disruption of external operation (surgical) wound, not elsewhere 10/07/2021 No Yes classified, initial encounter L97.822 Non-pressure chronic ulcer of other part of left lower leg with fat layer 10/07/2021 No Yes exposed I73.9 Peripheral vascular disease, unspecified 10/07/2021 No Yes I89.0 Lymphedema, not elsewhere classified 10/07/2021 No Yes F17.218 Nicotine dependence, cigarettes, with other nicotine-induced disorders 10/07/2021 No Yes Inactive Problems Resolved Problems Electronic Signature(s) Signed: 10/21/2021 10:10:12 AM By: Worthy Keeler PA-C Entered By: Worthy Keeler on 10/21/2021 10:10:11

## 2021-10-31 ENCOUNTER — Ambulatory Visit (INDEPENDENT_AMBULATORY_CARE_PROVIDER_SITE_OTHER): Payer: Medicare Other | Admitting: Internal Medicine

## 2021-10-31 ENCOUNTER — Ambulatory Visit (INDEPENDENT_AMBULATORY_CARE_PROVIDER_SITE_OTHER): Payer: Medicare Other

## 2021-10-31 ENCOUNTER — Encounter: Payer: Self-pay | Admitting: Internal Medicine

## 2021-10-31 VITALS — BP 120/72 | HR 70 | Temp 98.5°F | Ht 61.0 in | Wt 112.0 lb

## 2021-10-31 DIAGNOSIS — J449 Chronic obstructive pulmonary disease, unspecified: Secondary | ICD-10-CM | POA: Insufficient documentation

## 2021-10-31 DIAGNOSIS — R053 Chronic cough: Secondary | ICD-10-CM | POA: Diagnosis not present

## 2021-10-31 DIAGNOSIS — I10 Essential (primary) hypertension: Secondary | ICD-10-CM

## 2021-10-31 DIAGNOSIS — R509 Fever, unspecified: Secondary | ICD-10-CM | POA: Diagnosis not present

## 2021-10-31 DIAGNOSIS — F17210 Nicotine dependence, cigarettes, uncomplicated: Secondary | ICD-10-CM | POA: Diagnosis not present

## 2021-10-31 LAB — CBC WITH DIFFERENTIAL/PLATELET
Basophils Absolute: 0 10*3/uL (ref 0.0–0.1)
Basophils Relative: 0.6 % (ref 0.0–3.0)
Eosinophils Absolute: 0.1 10*3/uL (ref 0.0–0.7)
Eosinophils Relative: 1.2 % (ref 0.0–5.0)
HCT: 35.1 % — ABNORMAL LOW (ref 36.0–46.0)
Hemoglobin: 11.8 g/dL — ABNORMAL LOW (ref 12.0–15.0)
Lymphocytes Relative: 12.8 % (ref 12.0–46.0)
Lymphs Abs: 0.9 10*3/uL (ref 0.7–4.0)
MCHC: 33.5 g/dL (ref 30.0–36.0)
MCV: 91.4 fl (ref 78.0–100.0)
Monocytes Absolute: 0.6 10*3/uL (ref 0.1–1.0)
Monocytes Relative: 8.4 % (ref 3.0–12.0)
Neutro Abs: 5.7 10*3/uL (ref 1.4–7.7)
Neutrophils Relative %: 77 % (ref 43.0–77.0)
Platelets: 251 10*3/uL (ref 150.0–400.0)
RBC: 3.85 Mil/uL — ABNORMAL LOW (ref 3.87–5.11)
RDW: 14.5 % (ref 11.5–15.5)
WBC: 7.4 10*3/uL (ref 4.0–10.5)

## 2021-10-31 LAB — BASIC METABOLIC PANEL
BUN: 13 mg/dL (ref 6–23)
CO2: 30 mEq/L (ref 19–32)
Calcium: 9.2 mg/dL (ref 8.4–10.5)
Chloride: 101 mEq/L (ref 96–112)
Creatinine, Ser: 0.96 mg/dL (ref 0.40–1.20)
GFR: 58.21 mL/min — ABNORMAL LOW (ref >60.00)
Glucose, Bld: 135 mg/dL — ABNORMAL HIGH (ref 70–99)
Potassium: 3.7 mEq/L (ref 3.5–5.1)
Sodium: 138 mEq/L (ref 135–145)

## 2021-10-31 LAB — URINALYSIS, ROUTINE W REFLEX MICROSCOPIC
Bilirubin Urine: NEGATIVE
Ketones, ur: NEGATIVE
Leukocytes,Ua: NEGATIVE
Nitrite: NEGATIVE
Specific Gravity, Urine: <=1.005 — AB (ref 1.000–1.030)
Total Protein, Urine: NEGATIVE
Urine Glucose: NEGATIVE
Urobilinogen, UA: 0.2 (ref 0.0–1.0)
WBC, UA: NONE SEEN (ref 0–?)
pH: 6 (ref 5.0–8.0)

## 2021-10-31 LAB — HEPATIC FUNCTION PANEL
ALT: 7 U/L (ref 0–35)
AST: 12 U/L (ref 0–37)
Albumin: 4 g/dL (ref 3.5–5.2)
Alkaline Phosphatase: 63 U/L (ref 39–117)
Bilirubin, Direct: 0.2 mg/dL (ref 0.0–0.3)
Total Bilirubin: 0.7 mg/dL (ref 0.2–1.2)
Total Protein: 7 g/dL (ref 6.0–8.3)

## 2021-10-31 NOTE — Assessment & Plan Note (Signed)
BP Readings from Last 3 Encounters:  10/31/21 120/72  09/16/21 100/62  09/06/21 (!) 90/50   Stable, pt to continue medical treatment zebeta 5 mg qd, entresto 24-26 mg qd

## 2021-10-31 NOTE — Assessment & Plan Note (Signed)
O/w stable, cont current inhaler asd

## 2021-10-31 NOTE — Assessment & Plan Note (Signed)
Etiology unclear, exam is non specific but possibly ?UTI related, for labs including ua and cx, cxr, cbc et al, with tx pending results

## 2021-10-31 NOTE — Patient Instructions (Addendum)
Your COVID testing was done today  Please continue all other medications as before, and refills have been done if requested.  Please have the pharmacy call with any other refills you may need.  Please keep your appointments with your specialists as you may have planned - Dr Aundra Dubin, and Vascular in [place], and Pulmonary in November  Please go to the XRAY Department in the first floor for the x-ray testing  Please go to the LAB at the blood drawing area for the tests to be done  You will be contacted by phone if any changes need to be made immediately.  Otherwise, you will receive a letter about your results with an explanation, but please check with MyChart first.  Please remember to sign up for MyChart if you have not done so, as this will be important to you in the future with finding out test results, communicating by private email, and scheduling acute appointments online when needed.  Please make an Appointment to return in 4 months, or sooner if needed

## 2021-10-31 NOTE — Assessment & Plan Note (Signed)
Pt counsled to quit, pt not ready °

## 2021-10-31 NOTE — Progress Notes (Signed)
Patient ID: Kimberly [name], female   DOB: 1945-10-21, 76 y.o.   MRN: [id number]        Chief Complaint: follow up low grade fever       HPI:  Kimberly [name] is a 76 y.o. female here with c/o 3 days onset persistent low grade temp up to 100.9, denes URI symptoms, copd with cough seems no change, does have left lower leg below the knee wound but this conts to heal without redness or red streaks or drainage; Does have some urinary frequency but Denies urinary symptoms such as dysuria, frequency, urgency, flank pain, hematuria or n/v, fever, chills.  Still smoking, not ready to quit.  Pt has been active recently visiting multiple providers to accompany her husband , no known sick contacts       Wt Readings from Last 3 Encounters:  10/31/21 112 lb (50.8 kg)  09/16/21 110 lb 3.2 oz (50 kg)  09/06/21 108 lb 12.8 oz (49.4 kg)   BP Readings from Last 3 Encounters:  10/31/21 120/72  09/16/21 100/62  09/06/21 (!) 90/50         Past Medical History:  Diagnosis Date   AICD (automatic cardioverter/defibrillator) present 03/17/2017   Anxiety    Atrial tachycardia    Bursitis of shoulder, right, adhesive    Cancer (HCC)    CHF (congestive heart failure) ([place])    Chronic bronchitis ([place])    "1-2 times/yr" (01/23/2014)   COPD (chronic obstructive pulmonary disease) (HCC)    CVD (cerebrovascular disease)    Dyslipidemia    Dysrhythmia    Frequency of urination    GERD (gastroesophageal reflux disease)    Heart murmur    History of stomach ulcers    HTN (hypertension) 02/22/2011   Migraines    "stopped many years ago" (06/14/2014)   Osteoporosis 08/19/2016   Pericarditis    Pneumonia "10 times" (06/14/2014)   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)    Silent myocardial infarction ([place]) "late 1990's"   Stress incontinence    "was suppose to have been tacked up years ago but I didn't do it"   Syncope, near    Associated with atrial tachycardia-event recorder 1/16    Thoracic outlet syndrome    VSD (ventricular septal defect)    Past Surgical History:  Procedure Laterality Date   ABDOMINAL AORTOGRAM W/LOWER EXTREMITY N/A 12/17/2016   Procedure: ABDOMINAL AORTOGRAM W/LOWER EXTREMITY;  Surgeon: Wellington Hampshire, MD;  Location: [facility];  Service: Cardiovascular;  Laterality: N/A;   BIV ICD INSERTION CRT-D N/A 03/17/2017   Procedure: BIV ICD INSERTION CRT-D;  Surgeon: Evans Lance, MD;  Location: [facility];  Service: Cardiovascular;  Laterality: N/A;   CARDIAC CATHETERIZATION  "quite a few"   [place]  1970's   CORONARY ANGIOGRAM  2000   No significant CAD   ELECTROPHYSIOLOGIC STUDY N/A 06/14/2014   Procedure: A-Flutter/A-Tach/SVT Ablation;  Surgeon: Evans Lance, MD;  Location: [facility];  Service: Cardiovascular;  Laterality: N/A;   INSERTION OF ICD  03/17/2017   BIV   MULTIPLE TOOTH EXTRACTIONS     MYRINGOTOMY WITH TUBE PLACEMENT Right 2015   PERIPHERAL VASCULAR INTERVENTION  12/17/2016   Procedure: PERIPHERAL VASCULAR INTERVENTION;  Surgeon: Wellington Hampshire, MD;  Location: [facility];  Service: Cardiovascular;;  Right common femoral PTA and Stent   RIGHT/LEFT HEART CATH AND CORONARY ANGIOGRAPHY Bilateral 11/06/2016  Procedure: RIGHT/LEFT HEART CATH AND CORONARY ANGIOGRAPHY;  Surgeon: Wellington Hampshire, MD;  Location: [facility];  Service: Cardiovascular;  Laterality: Bilateral;   SVT ABLATION  06/14/2014   TUBAL LIGATION  1972   VSD REPAIR  1958; 1967    reports that she has been smoking cigarettes. She has a 17.50 pack-year smoking history. She has never used smokeless tobacco. She reports that she does not currently use alcohol after a past usage of about 1.0 standard drink of alcohol per week. She reports that she does not use drugs. family history includes Alcohol abuse in an other family member; Arthritis in some other family members; Cancer in her  father, mother, and another family member; Hypertension in some other family members; Stroke in some other family members; Throat cancer in an other family member. Allergies  Allergen Reactions   Mupirocin Shortness Of Breath and Other (See Comments)    Burning, pain, swelling and sob   Codeine Nausea Only   Current Outpatient Medications on File Prior to Visit  Medication Sig Dispense Refill   albuterol (PROVENTIL) (2.5 MG/3ML) 0.083% nebulizer solution Take 3 mLs (2.5 mg total) by nebulization every 6 (six) hours as needed for wheezing or shortness of breath. 75 mL 5   albuterol (VENTOLIN HFA) 108 (90 Base) MCG/ACT inhaler Inhale 2 puffs into the lungs every 6 (six) hours as needed for wheezing or shortness of breath. 8.5 g 3   ALPRAZolam (XANAX) 0.5 MG tablet 1 tab by mouth twice per day as needed 60 tablet 2   aspirin 81 MG tablet Take 1 tablet (81 mg total) by mouth daily. 100 tablet 99   bisoprolol (ZEBETA) 5 MG tablet Take 0.5 tablets (2.5 mg total) by mouth daily. 45 tablet 3   Budeson-Glycopyrrol-Formoterol (BREZTRI AEROSPHERE) 160-9-4.8 MCG/ACT AERO Inhale 2 puffs into the lungs 2 (two) times daily. 32.1 g 3   digoxin (LANOXIN) 0.125 MG tablet Take 0.5 tablets (62.5 mcg total) by mouth daily. 45 tablet 3   furosemide (LASIX) 20 MG tablet Take 1 tablet (20 mg total) by mouth daily as needed. 90 tablet 1   ibuprofen (ADVIL,MOTRIN) 200 MG tablet Take 200 mg by mouth daily as needed for headache.     nicotine (NICODERM CQ - DOSED IN MG/24 HR) 7 mg/24hr patch Place 1 patch (7 mg total) onto the skin daily. 14 patch 0   rosuvastatin (CRESTOR) 10 MG tablet TAKE 1 TABLET BY MOUTH DAILY 90 tablet 3   sacubitril-valsartan (ENTRESTO) 24-26 MG Take 0.5 tablets by mouth 2 (two) times daily.     spironolactone (ALDACTONE) 25 MG tablet Take 12.5 mg by mouth at bedtime.     tiZANidine (ZANAFLEX) 2 MG tablet Take 1 tablet (2 mg total) by mouth every 6 (six) hours as needed for muscle spasms. 40 tablet  1   No current facility-administered medications on file prior to visit.        ROS:  All others reviewed and negative.  Objective        PE:  BP 120/72 (BP Location: Left Arm, Patient Position: Sitting, Cuff Size: Normal)   Pulse 70   Temp 98.5 F (36.9 C) (Oral)   Ht '5\' 1"'$  (1.549 m)   Wt 112 lb (50.8 kg)   SpO2 96%   BMI 21.16 kg/m                 Constitutional: Pt appears in NAD  HENT: Head: NCAT.                Right Ear: External ear normal.                 Left Ear: External ear normal.                Eyes: . Pupils are equal, round, and reactive to light. Conjunctivae and EOM are normal               Nose: without d/c or deformity               Neck: Neck supple. Gross normal ROM               Cardiovascular: Normal rate and regular rhythm.                 Pulmonary/Chest: Effort normal and breath sounds without rales or wheezing.                Abd:  Soft, NT, ND, + BS, no organomegaly               Neurological: Pt is alert. At baseline orientation, motor grossly intact               Skin: Skin is warm. No rashes, no other new lesions, LE edema - none               Psychiatric: Pt behavior is normal without agitation   Micro: none  Cardiac tracings I have personally interpreted today:  none  Pertinent Radiological findings (summarize): none   Lab Results  Component Value Date   WBC 7.4 10/31/2021   HGB 11.8 (L) 10/31/2021   HCT 35.1 (L) 10/31/2021   PLT 251.0 10/31/2021   GLUCOSE 135 (H) 10/31/2021   CHOL 169 09/06/2021   TRIG 49 09/06/2021   HDL 91 09/06/2021   LDLDIRECT 112.6 03/16/2013   LDLCALC 68 09/06/2021   ALT 7 10/31/2021   AST 12 10/31/2021   NA 138 10/31/2021   K 3.7 10/31/2021   CL 101 10/31/2021   CREATININE 0.96 10/31/2021   BUN 13 10/31/2021   CO2 30 10/31/2021   TSH 0.87 04/16/2021   INR 1.02 12/15/2016   HGBA1C 6.3 04/16/2021   Assessment/Plan:  Kimberly [name] is a 76 y.o. White or Caucasian [1] female with  has a  past medical history of AICD (automatic cardioverter/defibrillator) present (03/17/2017), Anxiety, Atrial tachycardia, Bursitis of shoulder, right, adhesive, Cancer ([place]), CHF (congestive heart failure) (HCC), Chronic bronchitis ([place]), COPD (chronic obstructive pulmonary disease) ([place]), CVD (cerebrovascular disease), Dyslipidemia, Dysrhythmia, Frequency of urination, GERD (gastroesophageal reflux disease), Heart murmur, History of stomach ulcers, HTN (hypertension) (02/22/2011), Migraines, Osteoporosis (08/19/2016), Pericarditis, Pneumonia ("10 times" (06/14/2014)), 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), Silent myocardial infarction ([place]) ("late 1990's"), Stress incontinence, Syncope, near, Thoracic outlet syndrome, and VSD (ventricular septal defect).  Fever Etiology unclear, exam is non specific but possibly ?UTI related, for labs including ua and cx, cxr, cbc et al, with tx pending results  COPD (chronic obstructive pulmonary disease) ([place]) O/w stable, cont current inhaler asd  Cigarette smoker Pt counsled to quit, pt not ready  HTN (hypertension) BP Readings from Last 3 Encounters:  10/31/21 120/72  09/16/21 100/62  09/06/21 (!) 90/50   Stable, pt to continue medical treatment zebeta 5 mg qd, entresto 24-26 mg qd  Followup: Return in about 4 months (around 03/03/2022).  Cathlean Cower, MD 10/31/2021 11:46  [facility]

## 2021-11-01 LAB — URINE CULTURE: Result:: NO GROWTH

## 2021-11-04 ENCOUNTER — Encounter: Payer: Medicare Other | Admitting: Physician Assistant

## 2021-11-04 DIAGNOSIS — T8131XA Disruption of external operation (surgical) wound, not elsewhere classified, initial encounter: Secondary | ICD-10-CM | POA: Diagnosis not present

## 2021-11-04 DIAGNOSIS — F17218 Nicotine dependence, cigarettes, with other nicotine-induced disorders: Secondary | ICD-10-CM | POA: Diagnosis not present

## 2021-11-04 DIAGNOSIS — I110 Hypertensive heart disease with heart failure: Secondary | ICD-10-CM | POA: Diagnosis not present

## 2021-11-04 DIAGNOSIS — J449 Chronic obstructive pulmonary disease, unspecified: Secondary | ICD-10-CM | POA: Diagnosis not present

## 2021-11-04 DIAGNOSIS — Z8774 Personal history of (corrected) congenital malformations of heart and circulatory system: Secondary | ICD-10-CM | POA: Diagnosis not present

## 2021-11-04 DIAGNOSIS — I739 Peripheral vascular disease, unspecified: Secondary | ICD-10-CM | POA: Diagnosis not present

## 2021-11-04 DIAGNOSIS — I420 Dilated cardiomyopathy: Secondary | ICD-10-CM | POA: Diagnosis not present

## 2021-11-04 DIAGNOSIS — Z9049 Acquired absence of other specified parts of digestive tract: Secondary | ICD-10-CM | POA: Diagnosis not present

## 2021-11-04 DIAGNOSIS — L97822 Non-pressure chronic ulcer of other part of left lower leg with fat layer exposed: Secondary | ICD-10-CM | POA: Diagnosis not present

## 2021-11-04 DIAGNOSIS — I890 Lymphedema, not elsewhere classified: Secondary | ICD-10-CM | POA: Diagnosis not present

## 2021-11-04 DIAGNOSIS — F17210 Nicotine dependence, cigarettes, uncomplicated: Secondary | ICD-10-CM | POA: Diagnosis not present

## 2021-11-04 DIAGNOSIS — I509 Heart failure, unspecified: Secondary | ICD-10-CM | POA: Diagnosis not present

## 2021-11-04 DIAGNOSIS — E785 Hyperlipidemia, unspecified: Secondary | ICD-10-CM | POA: Diagnosis not present

## 2021-11-04 NOTE — Progress Notes (Signed)
Kimberly [name] [name] ([id number]) [id number] Page 1 of 8 Visit Report for 11/04/2021 Chief Complaint Document Details Patient Name: Date of Service: Kimberly [name], [place] 11/04/2021 10:15 A M Medical Record Number: [id number] Patient Account Number: [id number] Date of Birth/Sex: Treating RN: 1945/05/23 (76 y.o. Kimberly [name] Primary Care Provider: Cathlean Cower Other Clinician: Referring Provider: Treating Provider/Extender: [name] in Treatment: 4 Information Obtained from: Patient Chief Complaint Left LE surgical ulcer Electronic Signature(s) Signed: 11/04/2021 10:42:55 AM By: Worthy Keeler PA-C Entered By: Worthy Keeler on 11/04/2021 10:42:55 -------------------------------------------------------------------------------- HPI Details Patient Name: Date of Service: Kimberly [name], Kimberly [name]. 11/04/2021 10:15 A M Medical Record Number: [id number] Patient Account Number: [id number] Date of Birth/Sex: Treating RN: [birth year] (76 y.o. Kimberly [name] Primary Care Provider: Cathlean Cower Other Clinician: Referring Provider: Treating Provider/Extender: [name] in Treatment: 4 History of Present Illness HPI Description: 76 year old patient was seen by the nurse practitioner at the [facility] primary care for a wound which has been there on her right leg since August 2018. For a period of time she has been treated with doxycycline, clindamycin and Bactrim and the wound continues to have drainage and odor.on 11/20/2016 she was again put on Bactrim DS and referred to the wound center. Past medical history significant for hypertension, congestive heart failure, COPD, dyslipidemia, migraines, pericarditis, status post VSD repair, cholecystectomy tubal ligation, cesarean section, coronary angiogram,my tenotomy with tube placement on the right,dilated cardiomyopathy,tobacco abuse and has been a smoker for the last 35 years. She also has a history  of right great saphenous vein ablation but the patient does not confirm this for sure. She has had a venous duplex study done in August 2018 which showed no evidence of lower extremity deep or superficial venous thrombus or incompetence bilaterally. She has a arterial duplex study pending, which is going to be done later today. 12/22/16 on evaluation today patient appears to be doing fairly well in regard to her right lateral lower extremity wounds. She did undergo a right common femoral artery balloon angioplasty and stent placement which was performed on 12/17/16. Patient this is did appear to be a sufficient improvement according to the reviewed notes which is excellent news. Prior to this procedure patient did have a study of the lower extremity this was a Doppler performed on 12/08/16 this revealed moderate right lower extremity arterial disease involving the common femoral artery and iliac segment. There was also left arterial obstruction involving the iliac segment. On the left there was arterial obstruction involving the iliac segment, common femoral artery, and peroneal artery. Patient states she is still having some soreness left over and residual from the procedure. However overall she appears to be doing very well. She does have a follow-up Kimberly, [name] ([id number]) [id number] Page 2 of 8 study with vascular in the next week. 01/02/2017 -- was seen by Dr. Jacqulyn Cane -- she had an abdominal aortogram with lower extremity peripheral vascular intervention, and this showed moderate left common iliac artery stenosis. Short occlusion of the right common femoral artery with no significant infrainguinal disease. Short occlusion of the left common femoral artery with no significant infrainguinal disease and a successful drug-coated balloon angioplasty stent was placed in the right common femoral artery. She had a lower extremity arterial duplex examination done  which showed the resting right ABI is within normal range and no evidence of significant right lower extremity arterial disease. right ABI is 1.08 with a toe  pressure being 0.54. there was biphasic flow. The left ABI was 0.77 and the toe and this was 0.38 with monophasic flow.The right toe brachial indicis were abnormal. The left resting ABI indicates moderate left lower extremity arterial disease in the left toe brachial index is abnormal. 01/09/17 on evaluation today patient presents with continued ulcerations noted of the right lower extremity. This does seem to be doing a little bit better but still is slough covered. She has been tolerating the dressing changes fairly well but states that the debridement does tend to bother her pain wise. No fevers, chills, nausea, or vomiting noted at this time. She does tell me that the alginate dressing that she was looking to use following her last evaluation is expensive and insurance will not cover this it's actually cheaper for her to utilize the [place]. 02/06/17 on evaluation today patient's wound appears to be doing excellent at this point in time. In fact she has been tolerating the dressing changes very well there does not even appear to be any evidence or need for debridement today. She's not having any significant pain which is good news. No fevers, chills, nausea, or vomiting noted at this time. Overall she is extremely pleased with how things have progressed. 02/20/17 on evaluation today patient appears to be doing well in regard to her right lower extremity ulcer. In fact this appears to be completely healed. She still has some dry pinpoint scab like areas noted over the lower extremity but fortunately nothing significant and none of these seem to have any wound underneath when they come off it is completely healed underneath. Nonetheless we are gonna let this work itself off not try to do anything to invasive. 03/06/17 on evaluation today patient  appears to be doing extremely well in regard to her right lateral lower extremity ulcer site. She appears to be completely healed and there is no evidence of residual ulceration at this time which is great news. Readmission: 10/08/2020 upon evaluation today patient appears to be doing somewhat poorly in regard to a wound on her right leg. She tells me about a month ago one of her dogs scratched her and then subsequently this developed into a raised area that she has been concerned with. Fortunately there does not appear to be any pain per se with regard to this area. Unfortunately there is some concern in my mind to be honest about the possibility of a cancerous lesion here. Initially the plan was to actually do a punch biopsy as I proceeded with the procedure it became apparent that that was not necessarily going to be the best way to go I ended up removing the entirety of the raised area. It was somewhat coming loose just after removing the punch anyway. . The patient does have peripheral vascular disease though she recently had a stent placed. She also has a defibrillator pacemaker at this point. She does have lymphedema and does continue to smoke. She has no desire to quit. 10/18/2020 since I last saw this patient we have actually made a referral for her to have further evaluation and treatment with the skin surgery center we also tried [facility] dermatology she really needs a Mohs surgeon. Nonetheless screens were dermatology was not taking any new patients whatsoever and at the skin surgery center in [place] this is good to be something they should be calling her to get scheduled hopefully shortly. Nonetheless she is actually doing better with the wound measuring significantly smaller despite the cancer diagnosis.  Nonetheless I still think there is some work to do as far as what she is getting need from an excision standpoint. 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 10/25/2020 upon evaluation today patient presents for follow-up with regard to her right thigh ulcer. This actually appears to be completely healed. Again this was diagnosed as being positive for squamous cell carcinoma. Subsequently it still healed after we removed everything that we did. Unfortunately the margins were not clear so she still does have to go to the skin surgery center and she will be seeing them on 25 October. Readmission: 10-07-2021 upon evaluation today patient appears to be doing poorly in regard to the wound that that as a result of a surgical resection of a skin cancer performed by Dr. Garth Schlatter. This was performed on 04-03-2021 and has been very slow to heal. Fortunately there does not appear to be any signs of active infection at this time which is great news and overall very pleased in that regard there is some necrotic tissue however on the surface of the wound. With that being said the patient has not had any recent debridements. She also has not had any recent arterial studies her ABI today was 0.53 on the left although in cleaning the wound actually appears she has pretty good blood flow at this point. Patient's past medical history really has not changed significantly she still is a current every day smoker. 10-21-2021 upon evaluation today patient appears to be doing well currently in regard to her wound in fact this is significantly improved compared to last time I saw her. Fortunately there does not appear to be any evidence of active infection locally or systemically at this time. No fevers, chills, nausea, vomiting, or diarrhea. 11-04-2021 upon evaluation today patient appears to be doing well currently in regard to her wound although it does look like she may have been scrubbing this a little bit too briskly. I think that is led to this breaking down a little bit unfortunately although is not really an open area too  severely it definitely is still open nonetheless. Electronic Signature(s) Signed: 11/04/2021 10:52:00 AM By: Worthy Keeler PA-C Entered By: Worthy Keeler on 11/04/2021 10:52:00 Physical Exam Details -------------------------------------------------------------------------------- Kimberly [name] ([id number]) [id number] Page 3 of 8 Patient Name: Date of Service: Kimberly [name], [place] 11/04/2021 10:15 A M Medical Record Number: [id number] Patient Account Number: [id number] Date of Birth/Sex: Treating RN: 1945-12-06 (76 y.o. Kimberly [name] Primary Care Provider: Cathlean Cower Other Clinician: Referring Provider: Treating Provider/Extender: [name] in Treatment: 4 Constitutional Well-nourished and well-hydrated in no acute distress. Respiratory normal breathing without difficulty. Psychiatric this patient is able to make decisions and demonstrates good insight into disease process. Alert and Oriented x 3. pleasant and cooperative. Notes Upon inspection patient's wound bed actually showed signs of fairly good healing with good granulation and epithelization at this point. Fortunately I do not see any signs of active infection locally or systemically at this time. Electronic Signature(s) Signed: 11/04/2021 10:52:34 AM By: Worthy Keeler PA-C Entered By: Worthy Keeler on 11/04/2021 10:52:33 -------------------------------------------------------------------------------- Physician Orders Details Patient Name: Date of Service: Kimberly [name], Kimberly [name]. 11/04/2021 10:15 A M Medical Record Number: [id number] Patient Account Number: [id number] Date of Birth/Sex: Treating RN: 1945-07-15 (76 y.o. Kimberly [name] Primary Care Provider: Cathlean Cower Other Clinician: Referring Provider: Treating Provider/Extender: Theotis Burrow  Weeks in Treatment: 4 Verbal / Phone Orders: No Diagnosis Coding ICD-10 Coding Code Description T81.31XA Disruption  of external operation (surgical) wound, not elsewhere classified, initial encounter [phone] Non-pressure chronic ulcer of other part of left lower leg with fat layer exposed I73.9 Peripheral vascular disease, unspecified I89.0 Lymphedema, not elsewhere classified F17.218 Nicotine dependence, cigarettes, with other nicotine-induced disorders Follow-up Appointments Return Appointment in 2 weeks. Bathing/ [employer] wounds with antibacterial soap and water. Anesthetic (Use 'Patient Medications' Section for Anesthetic Order Entry) Lidocaine applied to wound bed Edema Control - Lymphedema / Segmental Compressive Device / Other Elevate, Exercise Daily and A void Standing for Long Periods of Time. Elevate legs to the level of the heart and pump ankles as often as possible Elevate leg(s) parallel to the floor when sitting. Wound Treatment Kimberly [name], Kimberly [name] ([id number]) [id number] Page 4 of 8 Wound #4 - Lower Leg Wound Laterality: Left, Lateral Cleanser: [place] Ancillary Kit - 15 Day Supply (Generic) 3 x Per Week/30 Days Discharge Instructions: Use supplies as instructed; Kit contains: (15) Saline Bullets; (15) 3x3 Gauze; 15 pr Gloves Cleanser: Soap and Water 3 x Per Week/30 Days Discharge Instructions: Gently cleanse wound with antibacterial soap, rinse and pat dry prior to dressing wounds Prim Dressing: Xeroform 4x4-HBD (in/in) 3 x Per Week/30 Days ary Discharge Instructions: Apply Xeroform 4x4-HBD (in/in) as directed Secondary Dressing: ABD Pad 5x9 (in/in) (Generic) 3 x Per Week/30 Days Discharge Instructions: Cover with ABD pad Secured With: Stretch Net Dressing, Latex-free, Size 5, Small-Head / Shoulder / Thigh (Generic) 3 x Per Week/30 Days Electronic Signature(s) Signed: 11/04/2021 2:02:33 PM By: Worthy Keeler PA-C Signed: 11/06/2021 8:53:50 AM By: [name] RN Entered By: [name] on 11/04/2021  10:51:48 -------------------------------------------------------------------------------- Problem List Details Patient Name: Date of Service: Kimberly [name], Kimberly [name]. 11/04/2021 10:15 A M Medical Record Number: [id number] Patient Account Number: [id number] Date of Birth/Sex: Treating RN: [birth year] (76 y.o. Kimberly [name] Primary Care Provider: Cathlean Cower Other Clinician: Referring Provider: Treating Provider/Extender: [name] in Treatment: 4 Active Problems ICD-10 Encounter Code Description Active Date MDM Diagnosis T81.31XA Disruption of external operation (surgical) wound, not elsewhere classified, 10/07/2021 No Yes initial encounter L97.822 Non-pressure chronic ulcer of other part of left lower leg with fat layer exposed9/18/2023 No Yes I73.9 Peripheral vascular disease, unspecified 10/07/2021 No Yes I89.0 Lymphedema, not elsewhere classified 10/07/2021 No Yes F17.218 Nicotine dependence, cigarettes, with other nicotine-induced disorders 10/07/2021 No Yes Inactive Problems Resolved Problems Kimberly [name], Kimberly [name] ([id number]) [id number] Page 5 of 8 Electronic Signature(s) Signed: 11/04/2021 7:42:49 AM By: Worthy Keeler PA-C Entered By: Worthy Keeler on 11/04/2021 10:42:49 -------------------------------------------------------------------------------- Progress Note Details Patient Name: Date of Service: Kimberly [name], Kimberly [name]. 11/04/2021 10:15 A M Medical Record Number: [id number] Patient Account Number: [id number] Date of Birth/Sex: Treating RN: 1945-12-05 (76 y.o. Kimberly [name] Primary Care Provider: Cathlean Cower Other Clinician: Referring Provider: Treating Provider/Extender: [name] in Treatment: 4 Subjective Chief Complaint Information obtained from Patient Left LE surgical ulcer History of Present Illness (HPI) 76 year old patient was seen by the nurse practitioner at the [facility] primary care for a wound which  has been there on her right leg since August 2018. For a period of time she has been treated with doxycycline, clindamycin and Bactrim and the wound continues to have drainage and odor.on 11/20/2016 she was again put on Bactrim DS and referred to the wound center. Past medical history significant for hypertension, congestive heart failure, COPD,  dyslipidemia, migraines, pericarditis, status post VSD repair, cholecystectomy tubal ligation, cesarean section, coronary angiogram,my tenotomy with tube placement on the right,dilated cardiomyopathy,tobacco abuse and has been a smoker for the last 35 years. She also has a history of right great saphenous vein ablation but the patient does not confirm this for sure. She has had a venous duplex study done in August 2018 which showed no evidence of lower extremity deep or superficial venous thrombus or incompetence bilaterally. She has a arterial duplex study pending, which is going to be done later today. 12/22/16 on evaluation today patient appears to be doing fairly well in regard to her right lateral lower extremity wounds. She did undergo a right common femoral artery balloon angioplasty and stent placement which was performed on 12/17/16. Patient this is did appear to be a sufficient improvement according to the reviewed notes which is excellent news. Prior to this procedure patient did have a study of the lower extremity this was a Doppler performed on 12/08/16 this revealed moderate right lower extremity arterial disease involving the common femoral artery and iliac segment. There was also left arterial obstruction involving the iliac segment. On the left there was arterial obstruction involving the iliac segment, common femoral artery, and peroneal artery. Patient states she is still having some soreness left over and residual from the procedure. However overall she appears to be doing very well. She does have a follow-up study with vascular in the next  week. 01/02/2017 -- was seen by Dr. Jacqulyn Cane -- she had an abdominal aortogram with lower extremity peripheral vascular intervention, and this showed moderate left common iliac artery stenosis. Short occlusion of the right common femoral artery with no significant infrainguinal disease. Short occlusion of the left common femoral artery with no significant infrainguinal disease and a successful drug-coated balloon angioplasty stent was placed in the right common femoral artery. She had a lower extremity arterial duplex examination done which showed the resting right ABI is within normal range and no evidence of significant right lower extremity arterial disease. right ABI is 1.08 with a toe pressure being 0.54. there was biphasic flow. The left ABI was 0.77 and the toe and this was 0.38 with monophasic flow.The right toe brachial indicis were abnormal. The left resting ABI indicates moderate left lower extremity arterial disease in the left toe brachial index is abnormal. 01/09/17 on evaluation today patient presents with continued ulcerations noted of the right lower extremity. This does seem to be doing a little bit better but still is slough covered. She has been tolerating the dressing changes fairly well but states that the debridement does tend to bother her pain wise. No fevers, chills, nausea, or vomiting noted at this time. She does tell me that the alginate dressing that she was looking to use following her last evaluation is expensive and insurance will not cover this it's actually cheaper for her to utilize the [place]. 02/06/17 on evaluation today patient's wound appears to be doing excellent at this point in time. In fact she has been tolerating the dressing changes very well there does not even appear to be any evidence or need for debridement today. She's not having any significant pain which is good news. No fevers, chills, nausea, or vomiting noted at this time. Overall she is  extremely pleased with how things have progressed. 02/20/17 on evaluation today patient appears to be doing well in regard to her right lower extremity ulcer. In fact this appears to be completely healed. She still  has some dry pinpoint scab like areas noted over the lower extremity but fortunately nothing significant and none of these seem to have any wound underneath when they come off it is completely healed underneath. Nonetheless we are gonna let this work itself off not try to do anything to invasive. 03/06/17 on evaluation today patient appears to be doing extremely well in regard to her right lateral lower extremity ulcer site. She appears to be completely healed and there is no evidence of residual ulceration at this time which is great news. Readmission: 10/08/2020 upon evaluation today patient appears to be doing somewhat poorly in regard to a wound on her right leg. She tells me about a month ago one of her dogs scratched her and then subsequently this developed into a raised area that she has been concerned with. Fortunately there does not appear to be any Kimberly [name], Kimberly [name] ([id number]) [id number] Page 6 of 8 pain per se with regard to this area. Unfortunately there is some concern in my mind to be honest about the possibility of a cancerous lesion here. Initially the plan was to actually do a punch biopsy as I proceeded with the procedure it became apparent that that was not necessarily going to be the best way to go I ended up removing the entirety of the raised area. It was somewhat coming loose just after removing the punch anyway. . The patient does have peripheral vascular disease though she recently had a stent placed. She also has a defibrillator pacemaker at this point. She does have lymphedema and does continue to smoke. She has no desire to quit. 10/18/2020 since I last saw this patient we have actually made a referral for her to have further evaluation and  treatment with the skin surgery center we also tried [facility] dermatology she really needs a Mohs surgeon. Nonetheless screens were dermatology was not taking any new patients whatsoever and at the skin surgery center in [place] this is good to be something they should be calling her to get scheduled hopefully shortly. Nonetheless she is actually doing better with the wound measuring significantly smaller despite the cancer diagnosis. Nonetheless I still think there is some work to do as far as what she is getting need from an excision standpoint. 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 10/25/2020 upon evaluation today patient presents for follow-up with regard to her right thigh ulcer. This actually appears to be completely healed. Again this was diagnosed as being positive for squamous cell carcinoma. Subsequently it still healed after we removed everything that we did. Unfortunately the margins were not clear so she still does have to go to the skin surgery center and she will be seeing them on 25 October. Readmission: 10-07-2021 upon evaluation today patient appears to be doing poorly in regard to the wound that that as a result of a surgical resection of a skin cancer performed by Dr. Garth Schlatter. This was performed on 04-03-2021 and has been very slow to heal. Fortunately there does not appear to be any signs of active infection at this time which is great news and overall very pleased in that regard there is some necrotic tissue however on the surface of the wound. With that being said the patient has not had any recent debridements. She also has not had any recent arterial studies her ABI today was 0.53 on the left although in cleaning the wound actually appears she has pretty  good blood flow at this point. Patient's past medical history really has not changed significantly she still is a current every day  smoker. 10-21-2021 upon evaluation today patient appears to be doing well currently in regard to her wound in fact this is significantly improved compared to last time I saw her. Fortunately there does not appear to be any evidence of active infection locally or systemically at this time. No fevers, chills, nausea, vomiting, or diarrhea. 11-04-2021 upon evaluation today patient appears to be doing well currently in regard to her wound although it does look like she may have been scrubbing this a little bit too briskly. I think that is led to this breaking down a little bit unfortunately although is not really an open area too severely it definitely is still open nonetheless. Objective Constitutional Well-nourished and well-hydrated in no acute distress. Vitals Time Taken: 10:16 AM, Height: 61 in, Weight: 106 lbs, BMI: 20, Temperature: 98.4 F, Pulse: 75 bpm, Respiratory Rate: 18 breaths/min, Blood Pressure: 96/63 mmHg. Respiratory normal breathing without difficulty. Psychiatric this patient is able to make decisions and demonstrates good insight into disease process. Alert and Oriented x 3. pleasant and cooperative. General Notes: Upon inspection patient's wound bed actually showed signs of fairly good healing with good granulation and epithelization at this point. Fortunately I do not see any signs of active infection locally or systemically at this time. Integumentary (Hair, Skin) Wound #4 status is Open. Original cause of wound was Surgical Injury. The date acquired was: 04/03/2021. The wound has been in treatment 4 weeks. The wound is located on the Left,Lateral Lower Leg. The wound measures 2cm length x 1cm width x 0.1cm depth; 1.571cm^2 area and 0.157cm^3 volume. There is Fat Layer (Subcutaneous Tissue) exposed. There is no tunneling or undermining noted. There is a medium amount of serosanguineous drainage noted. There is medium (34-66%) granulation within the wound bed. There is a  medium (34-66%) amount of necrotic tissue within the wound bed including Adherent Slough. Assessment Active Problems ICD-10 Disruption of external operation (surgical) wound, not elsewhere classified, initial encounter Non-pressure chronic ulcer of other part of left lower leg with fat layer exposed Peripheral vascular disease, unspecified Lymphedema, not elsewhere classified Nicotine dependence, cigarettes, with other nicotine-induced disorders Plan Kimberly [name], Kimberly [name] ([id number]) [id number] Page 7 of 8 Follow-up Appointments: Return Appointment in 2 weeks. Bathing/ Shower/ Hygiene: Wash wounds with antibacterial soap and water. Anesthetic (Use 'Patient Medications' Section for Anesthetic Order Entry): Lidocaine applied to wound bed Edema Control - Lymphedema / Segmental Compressive Device / Other: Elevate, Exercise Daily and Avoid Standing for Long Periods of Time. Elevate legs to the level of the heart and pump ankles as often as possible Elevate leg(s) parallel to the floor when sitting. WOUND #4: - Lower Leg Wound Laterality: Left, Lateral Cleanser: Byram Ancillary Kit - 15 Day Supply (Generic) 3 x Per Week/30 Days Discharge Instructions: Use supplies as instructed; Kit contains: (15) Saline Bullets; (15) 3x3 Gauze; 15 pr Gloves Cleanser: Soap and Water 3 x Per Week/30 Days Discharge Instructions: Gently cleanse wound with antibacterial soap, rinse and pat dry prior to dressing wounds Prim Dressing: Xeroform 4x4-HBD (in/in) 3 x Per Week/30 Days ary Discharge Instructions: Apply Xeroform 4x4-HBD (in/in) as directed Secondary Dressing: ABD Pad 5x9 (in/in) (Generic) 3 x Per Week/30 Days Discharge Instructions: Cover with ABD pad Secured With: Stretch Net Dressing, Latex-free, Size 5, Small-Head / Shoulder / Thigh (Generic) 3 x Per Week/30 Days 1. I am going to recommend that we  have the patient continue to monitor for any signs of infection. I do believe that  she is doing quite well currently which is great news. 2. Also can recommend that we have the patient continue with the Xeroform gauze dressings which I think are doing well. 3. I am also going to suggest that the ABD pad to cover would probably be the best way to go. 4. I would also recommend that she use Aquaphor or AandD ointment to try to help keep the leg moisturized that would not get too dry. We will see patient back for reevaluation in 1 week here in the clinic. If anything worsens or changes patient will contact our office for additional recommendations. Electronic Signature(s) Signed: 11/04/2021 7:54:27 AM By: Worthy Keeler PA-C Entered By: Worthy Keeler on 11/04/2021 10:54:26 -------------------------------------------------------------------------------- SuperBill Details Patient Name: Date of Service: Kimberly [name], Kimberly [name]. 11/04/2021 Medical Record Number: [id number] Patient Account Number: [id number] Date of Birth/Sex: Treating RN: [birth year] (76 y.o. Kimberly [name] Primary Care Provider: Cathlean Cower Other Clinician: Referring Provider: Treating Provider/Extender: [name] in Treatment: 4 Diagnosis Coding ICD-10 Codes Code Description T81.31XA Disruption of external operation (surgical) wound, not elsewhere classified, initial encounter L97.822 Non-pressure chronic ulcer of other part of left lower leg with fat layer exposed I73.9 Peripheral vascular disease, unspecified I89.0 Lymphedema, not elsewhere classified F17.218 Nicotine dependence, cigarettes, with other nicotine-induced disorders Facility Procedures : CPT4 Code: 48303220 Description: 19924 - WOUND CARE VISIT-LEV 2 EST PT Modifier: Quantity: 1 Physician Procedures : CPT4 Code Description Modifier Kimberly [name], Kimberly [name] ([id number]) [id number] P [id number] - WC PHYS LEVEL 3 - EST PT 1 ICD-10 Diagnosis Description T81.31XA Disruption of external operation (surgical)  wound, not elsewhere  classified, initial encounter L97.822 Non-pressure chronic ulcer of other part of left lower leg with fat layer exposed I73.9 Peripheral vascular disease, unspecified I89.0 Lymphedema, not elsewhere classified Quantity: age 70 of 10 Electronic Signature(s) Signed: 11/04/2021 7:54:40 AM By: Worthy Keeler PA-C Entered By: Worthy Keeler on 11/04/2021 10:54:39

## 2021-11-06 NOTE — Progress Notes (Signed)
Kimberly [name] ([id number]) [id number] Page 1 of 9 Visit Report for 11/04/2021 Arrival Information Details Patient Name: Date of Service: Kimberly [name], [place] 11/04/2021 10:15 A M Medical Record Number: [id number] Patient Account Number: [id number] Date of Birth/Sex: Treating RN: December 25, 1945 (76 y.o. Kimberly [name] Primary Care Fancy Dunkley: Cathlean Cower Other Clinician: Referring Rahmon Heigl: Treating Barbarann Kelly/Extender: [name] in Treatment: 4 Visit Information History Since Last Visit All ordered tests and consults were completed: No Patient Arrived: Ambulatory Added or deleted any medications: No Arrival Time: 10:11 Any new allergies or adverse reactions: No Accompanied By: self Had a fall or experienced change in No Transfer Assistance: None activities of daily living that may affect Patient Identification Verified: Yes risk of falls: Secondary Verification Process Completed: Yes Signs or symptoms of abuse/neglect since last visito No Patient Requires Transmission-Based Precautions: No Hospitalized since last visit: No Patient Has Alerts: No Implantable device outside of the clinic excluding No cellular tissue based products placed in the center since last visit: Has Dressing in Place as Prescribed: Yes Pain Present Now: No Electronic Signature(s) Signed: 11/04/2021 11:54:22 AM By: [name] RN Entered By: [name] on 11/04/2021 11:54:22 -------------------------------------------------------------------------------- Clinic Level of Care Assessment Details Patient Name: Date of Service: Kimberly [name], [place] [name]. 11/04/2021 10:15 A M Medical Record Number: [id number] Patient Account Number: [id number] Date of Birth/Sex: Treating RN: 08/21/1945 (76 y.o. Kimberly [name] Primary Care Hillari Zumwalt: Cathlean Cower Other Clinician: Referring Yousuf Ager: Treating Blessin Kanno/Extender: [name] in Treatment: 4 Clinic Level of Care  Assessment Items TOOL 4 Quantity Score X- 1 0 Use when only an EandM is performed on FOLLOW-UP visit ASSESSMENTS - Nursing Assessment / Reassessment X- 1 10 Reassessment of Co-morbidities (includes updates in patient status) X- 1 5 Reassessment of Adherence to Treatment Plan Kimberly [name] ([id number]) [id number] Page 2 of 9 ASSESSMENTS - Wound and Skin A ssessment / Reassessment X - Simple Wound Assessment / Reassessment - one wound 1 5 _0  - 0 Complex Wound Assessment / Reassessment - multiple wounds _1  - 0 Dermatologic / Skin Assessment (not related to wound area) ASSESSMENTS - Focused Assessment _2  - 0 Circumferential Edema Measurements - multi extremities _3  - 0 Nutritional Assessment / Counseling / Intervention _4  - 0 Lower Extremity Assessment (monofilament, tuning fork, pulses) _5  - 0 Peripheral Arterial Disease Assessment (using hand held doppler) ASSESSMENTS - Ostomy and/or Continence Assessment and Care _6  - 0 Incontinence Assessment and Management _7  - 0 Ostomy Care Assessment and Management (repouching, etc.) PROCESS - Coordination of Care X - Simple Patient / Family Education for ongoing care 1 15 _8  - 0 Complex (extensive) Patient / Family Education for ongoing care _9  - 0 Staff obtains Programmer, systems, Records, T Results / Process Orders est _10  - 0 Staff telephones HHA, Nursing Homes / Clarify orders / etc _11  - 0 Routine Transfer to another Facility (non-emergent condition) _12  - 0 Routine Hospital Admission (non-emergent condition) _13  - 0 New Admissions / Biomedical engineer / Ordering NPWT Apligraf, etc. , _14  - 0 Emergency Hospital Admission (emergent condition) X- 1 10 Simple Discharge Coordination _15  - 0 Complex (extensive) Discharge Coordination PROCESS - Special Needs _16  - 0 Pediatric / Minor Patient Management _17  - 0 Isolation Patient Management _18  - 0 Hearing / Language / Visual special needs _19  - 0 Assessment  of Community assistance (transportation, D/C planning, etc.) _20  - 0 Additional assistance / Altered mentation _21  - 0 Support Surface(s) Assessment (bed, cushion, seat, etc.)  INTERVENTIONS - Wound Cleansing / Measurement X - Simple Wound Cleansing - one wound 1 5 _0  - 0 Complex Wound Cleansing - multiple wounds X- 1 5 Wound Imaging (photographs - any number of wounds) _1  - 0 Wound Tracing (instead of photographs) X- 1 5 Simple Wound Measurement - one wound _2  - 0 Complex Wound Measurement - multiple wounds INTERVENTIONS - Wound Dressings X - Small Wound Dressing one or multiple wounds 1 10 _3  - 0 Medium Wound Dressing one or multiple wounds _4  - 0 Large Wound Dressing one or multiple wounds <VOZDGUYQIHKVQQVZ>_5<\/GLOVFIEPPIRJJOAC>_1  - 0 Application of Medications - topical <YSAYTKZSWFUXNATF>_5<\/DDUKGURKYHCWCBJS>_2  - 0 Application of Medications - injection INTERVENTIONS - Miscellaneous _7  - 0 External ear exam Kimberly [name], Kimberly [name] ([id number]) [id number] Page 3 of 9 _8  - 0 Specimen Collection (cultures, biopsies, blood, body fluids, etc.) _9  - 0 Specimen(s) / Culture(s) sent or taken to Lab for analysis _10  - 0 Patient Transfer (multiple staff / [name] Lift / Similar devices) _11  - 0 Simple Staple / Suture removal (25 or less) _12  - 0 Complex Staple / Suture removal (26 or more) _13  - 0 Hypo / Hyperglycemic Management (close monitor of Blood Glucose) _14  - 0 Ankle / Brachial Index (ABI) - do not check if billed separately X- 1 5 Vital Signs Has the patient been seen at the hospital within the last three years: Yes Total Score: 75 Level Of Care: New/Established - Level 2 Electronic Signature(s) Signed: 11/06/2021 8:53:50 AM By: [name] RN Entered By: [name] on 11/04/2021 10:49:02 -------------------------------------------------------------------------------- Encounter Discharge Information Details Patient Name: Date of Service: Kimberly [name], [name]. 11/04/2021 10:15 A M Medical Record Number: [id number] Patient  Account Number: [id number] Date of Birth/Sex: Treating RN: June 30, 1945 (76 y.o. Kimberly [name] Primary Care Alpha Mysliwiec: Cathlean Cower Other Clinician: Referring Jaasiel Hollyfield: Treating Lourdes Manning/Extender: [name] in Treatment: 4 Encounter Discharge Information Items Discharge Condition: Stable Ambulatory Status: Ambulatory Discharge Destination: Home Transportation: Private Auto Accompanied By: self Schedule Follow-up Appointment: Yes Clinical Summary of Care: Electronic Signature(s) Signed: 11/06/2021 8:53:50 AM By: [name] RN Entered By: [name] on 11/04/2021 10:49:55 Lower Extremity Assessment Details -------------------------------------------------------------------------------- Kimberly [name] ([id number]) [id number] Page 4 of 9 Patient Name: Date of Service: Kimberly [name], [place] 11/04/2021 10:15 A M Medical Record Number: [id number] Patient Account Number: [id number] Date of Birth/Sex: Treating RN: November 23, 1945 (76 y.o. Kimberly [name] Primary Care Kyleena Scheirer: Cathlean Cower Other Clinician: Referring Zinedine Ellner: Treating Adaya Garmany/Extender: [name] in Treatment: 4 Edema Assessment Left: Right: Assessed: No No Edema: Yes Calf Left: Right: Point of Measurement: 30 cm From Medial Instep 30 cm Ankle Left: Right: Point of Measurement: 10 cm From Medial Instep 20 cm Vascular Assessment Left: Right: Pulses: Dorsalis Pedis Palpable: Yes Electronic Signature(s) Signed: 11/06/2021 8:53:50 AM By: [name] RN Entered By: [name] on 11/04/2021 10:20:21 -------------------------------------------------------------------------------- Multi Wound Chart Details Patient Name: Date of Service: Kimberly [name], [name]. 11/04/2021 10:15 A M Medical Record Number: [id number] Patient Account Number: [id number] Date of Birth/Sex: Treating RN: 06-Aug-1945 (76 y.o. Kimberly [name] Primary Care Cigi Bega: Cathlean Cower Other  Clinician: Referring Gumaro Brightbill: Treating Khamiyah Grefe/Extender: [name] in Treatment: 4 Vital Signs Height(in): 61 Pulse(bpm): 75 Weight(lbs): 106 Blood Pressure(mmHg): 96/63 Body Mass Index(BMI): 20 Temperature(F): 98.4 Respiratory Rate(breaths/min): 18 [4:Photos:] [N/A:N/A] Left, Lateral Lower Leg N/A N/A Wound Location: Surgical Injury N/A N/A Wounding Event: Kimberly [name], Kimberly [name] ([id number]) [id number] Page 5 of 9 Dehisced Wound N/A N/A Primary  Etiology: Anemia, Asthma, Chronic Obstructive N/A N/A Comorbid History: Pulmonary Disease (COPD), Congestive Heart Failure, Hypertension, Myocardial Infarction, Peripheral Arterial Disease 04/03/2021 N/A N/A Date Acquired: 4 N/A N/A Weeks of Treatment: Open N/A N/A Wound Status: No N/A N/A Wound Recurrence: 2x1x0.1 N/A N/A Measurements L x W x D (cm) 1.571 N/A N/A A (cm) : rea 0.157 N/A N/A Volume (cm) : 4.70% N/A N/A % Reduction in Area: 68.30% N/A N/A % Reduction in Volume: Full Thickness Without Exposed N/A N/A Classification: Support Structures Medium N/A N/A Exudate Amount: Serosanguineous N/A N/A Exudate Type: red, brown N/A N/A Exudate Color: Medium (34-66%) N/A N/A Granulation Amount: Medium (34-66%) N/A N/A Necrotic Amount: Fat Layer (Subcutaneous Tissue): Yes N/A N/A Exposed Structures: Fascia: No Tendon: No Muscle: No Joint: No Bone: No None N/A N/A Epithelialization: Treatment Notes Electronic Signature(s) Signed: 11/06/2021 8:53:50 AM By: [name] RN Entered By: [name] on 11/04/2021 10:21:19 -------------------------------------------------------------------------------- [place] Details Patient Name: Date of Service: Kimberly [name], [place] [name]. 11/04/2021 10:15 A M Medical Record Number: [id number] Patient Account Number: [id number] Date of Birth/Sex: Treating RN: 23-Dec-1945 (76 y.o. Kimberly [name] Primary Care Helia Haese:  Cathlean Cower Other Clinician: Referring Raiyah Speakman: Treating Wilma Michaelson/Extender: [name] in Treatment: 4 Active Inactive Wound/Skin Impairment Nursing Diagnoses: Knowledge deficit related to ulceration/compromised skin integrity Goals: Patient/caregiver will verbalize understanding of skin care regimen Date Initiated: 10/07/2021 Target Resolution Date: 11/06/2021 Goal Status: Active Ulcer/skin breakdown will have a volume reduction of 30% by week 4 Date Initiated: 10/07/2021 Target Resolution Date: 11/06/2021 Goal Status: Active Ulcer/skin breakdown will have a volume reduction of 50% by week 8 Date Initiated: 10/07/2021 Target Resolution Date: 12/07/2021 Goal Status: Active Kimberly [name], Kimberly [name] ([id number]) [id number] Page 6 of 9 Ulcer/skin breakdown will have a volume reduction of 80% by week 12 Date Initiated: 10/07/2021 Target Resolution Date: 01/06/2022 Goal Status: Active Ulcer/skin breakdown will heal within 14 weeks Date Initiated: 10/07/2021 Target Resolution Date: 02/06/2022 Goal Status: Active Interventions: Assess patient/caregiver ability to obtain necessary supplies Assess patient/caregiver ability to perform ulcer/skin care regimen upon admission and as needed Assess ulceration(s) every visit Notes: Electronic Signature(s) Signed: 11/06/2021 8:53:50 AM By: [name] RN Entered By: [name] on 11/04/2021 10:20:30 -------------------------------------------------------------------------------- Pain Assessment Details Patient Name: Date of Service: Kimberly [name], [place] [name]. 11/04/2021 10:15 A M Medical Record Number: [id number] Patient Account Number: [id number] Date of Birth/Sex: Treating RN: 1945-10-31 (76 y.o. Kimberly [name] Primary Care Wave Calzada: Cathlean Cower Other Clinician: Referring Raylan Troiani: Treating Kabella Cassidy/Extender: [name] in Treatment: 4 Active Problems Location of Pain Severity and  Description of Pain Patient Has Paino No Site Locations Pain Management and Medication Current Pain Management: Electronic Signature(s) Signed: 11/06/2021 8:53:50 AM By: [name] RN Entered By: [name] on 11/04/2021 10:16:29 Kimberly [name] ([id number]) [id number] Page 7 of 9 -------------------------------------------------------------------------------- Patient/Caregiver Education Details Patient Name: Date of Service: Kimberly [name], [place] 10/16/2023andnbsp10:15 A M Medical Record Number: [id number] Patient Account Number: [id number] Date of Birth/Gender: Treating RN: [birth year] (76 y.o. Kimberly [name] Primary Care Physician: Cathlean Cower Other Clinician: Referring Physician: Treating Physician/Extender: [name] in Treatment: 4 Education Assessment Education Provided To: Patient Education Topics Provided Wound/Skin Impairment: Methods: Explain/Verbal Responses: State content correctly Electronic Signature(s) Signed: 11/06/2021 8:53:50 AM By: [name] RN Entered By: [name] on 11/04/2021 10:49:23 -------------------------------------------------------------------------------- Wound Assessment Details Patient Name: Date of Service: Kimberly [name], [name]. 11/04/2021 10:15 A M Medical Record Number: [id number]  Patient Account Number: [id number] Date of Birth/Sex: Treating RN: [birth year] (76 y.o. Kimberly [name] Primary Care Tamalyn Wadsworth: Cathlean Cower Other Clinician: Referring Hermelinda Diegel: Treating Shon Mansouri/Extender: [name] in Treatment: 4 Wound Status Wound Number: 4 Primary Dehisced Wound Etiology: Wound Location: Left, Lateral Lower Leg Wound Open Wounding Event: Surgical Injury Status: Date Acquired: 04/03/2021 Comorbid Anemia, Asthma, Chronic Obstructive Pulmonary Disease (COPD), Weeks Of Treatment: 4 History: Congestive Heart Failure, Hypertension, Myocardial Infarction, Clustered Wound: No  Peripheral Arterial Disease Photos DESIRRE, EICKHOFF [name] ([id number]) [id number] Page 8 of 9 Wound Measurements Length: (cm) 2 Width: (cm) 1 Depth: (cm) 0.1 Area: (cm) 1.571 Volume: (cm) 0.157 % Reduction in Area: 4.7% % Reduction in Volume: 68.3% Epithelialization: None Tunneling: No Undermining: No Wound Description Classification: Full Thickness Without Exposed Support Exudate Amount: Medium Exudate Type: Serosanguineous Exudate Color: red, brown Structures Foul Odor After Cleansing: No Slough/Fibrino Yes Wound Bed Granulation Amount: Medium (34-66%) Exposed Structure Necrotic Amount: Medium (34-66%) Fascia Exposed: No Necrotic Quality: Adherent Slough Fat Layer (Subcutaneous Tissue) Exposed: Yes Tendon Exposed: No Muscle Exposed: No Joint Exposed: No Bone Exposed: No Treatment Notes Wound #4 (Lower Leg) Wound Laterality: Left, Lateral Cleanser Byram Ancillary Kit - 15 Day Supply Discharge Instruction: Use supplies as instructed; Kit contains: (15) Saline Bullets; (15) 3x3 Gauze; 15 pr Gloves Soap and Water Discharge Instruction: Gently cleanse wound with antibacterial soap, rinse and pat dry prior to dressing wounds Peri-Wound Care Topical Primary Dressing Xeroform 4x4-HBD (in/in) Discharge Instruction: Apply Xeroform 4x4-HBD (in/in) as directed Secondary Dressing ABD Pad 5x9 (in/in) Discharge Instruction: Cover with ABD pad Secured With Stretch Net Dressing, Latex-free, Size 5, Small-Head / Shoulder / Thigh Compression Wrap Compression Stockings Add-Ons Electronic Signature(s) Signed: 11/06/2021 8:53:50 AM By: [name] RN Entered By: [name] on 11/04/2021 10:19:34 Kimberly [name] ([id number]) [id number] Page 9 of 9 -------------------------------------------------------------------------------- Vitals Details Patient Name: Date of Service: Kimberly [name], [place] 11/04/2021 10:15 A M Medical Record  Number: [id number] Patient Account Number: [id number] Date of Birth/Sex: Treating RN: [birth year] (76 y.o. Kimberly [name] Primary Care Wilferd Ritson: Cathlean Cower Other Clinician: Referring Yvana Samonte: Treating Karna Abed/Extender: [name] in Treatment: 4 Vital Signs Time Taken: 10:16 Temperature (F): 98.4 Height (in): 61 Pulse (bpm): 75 Weight (lbs): 106 Respiratory Rate (breaths/min): 18 Body Mass Index (BMI): 20 Blood Pressure (mmHg): 96/63 Reference Range: 80 - 120 mg / dl Electronic Signature(s) Signed: 11/06/2021 8:53:50 AM By: [name] RN Entered By: [name] on 11/04/2021 10:16:23

## 2021-11-07 ENCOUNTER — Ambulatory Visit: Payer: Medicare Other | Admitting: Physician Assistant

## 2021-11-14 ENCOUNTER — Ambulatory Visit: Payer: Medicare Other | Admitting: Physician Assistant

## 2021-11-18 ENCOUNTER — Encounter: Payer: Medicare Other | Admitting: Physician Assistant

## 2021-11-18 DIAGNOSIS — I110 Hypertensive heart disease with heart failure: Secondary | ICD-10-CM | POA: Diagnosis not present

## 2021-11-18 DIAGNOSIS — I509 Heart failure, unspecified: Secondary | ICD-10-CM | POA: Diagnosis not present

## 2021-11-18 DIAGNOSIS — I420 Dilated cardiomyopathy: Secondary | ICD-10-CM | POA: Diagnosis not present

## 2021-11-18 DIAGNOSIS — Z9049 Acquired absence of other specified parts of digestive tract: Secondary | ICD-10-CM | POA: Diagnosis not present

## 2021-11-18 DIAGNOSIS — I890 Lymphedema, not elsewhere classified: Secondary | ICD-10-CM | POA: Diagnosis not present

## 2021-11-18 DIAGNOSIS — E785 Hyperlipidemia, unspecified: Secondary | ICD-10-CM | POA: Diagnosis not present

## 2021-11-18 DIAGNOSIS — F17218 Nicotine dependence, cigarettes, with other nicotine-induced disorders: Secondary | ICD-10-CM | POA: Diagnosis not present

## 2021-11-18 DIAGNOSIS — I739 Peripheral vascular disease, unspecified: Secondary | ICD-10-CM | POA: Diagnosis not present

## 2021-11-18 DIAGNOSIS — T8131XA Disruption of external operation (surgical) wound, not elsewhere classified, initial encounter: Secondary | ICD-10-CM | POA: Diagnosis not present

## 2021-11-18 DIAGNOSIS — J449 Chronic obstructive pulmonary disease, unspecified: Secondary | ICD-10-CM | POA: Diagnosis not present

## 2021-11-18 DIAGNOSIS — L97822 Non-pressure chronic ulcer of other part of left lower leg with fat layer exposed: Secondary | ICD-10-CM | POA: Diagnosis not present

## 2021-11-18 DIAGNOSIS — Z8774 Personal history of (corrected) congenital malformations of heart and circulatory system: Secondary | ICD-10-CM | POA: Diagnosis not present

## 2021-11-18 DIAGNOSIS — F17210 Nicotine dependence, cigarettes, uncomplicated: Secondary | ICD-10-CM | POA: Diagnosis not present

## 2021-11-18 NOTE — Progress Notes (Addendum)
VICCI, REDER [name] ([id number]) [id number] Page 1 of 8 Visit Report for 11/18/2021 Arrival Information Details Patient Name: Date of Service: Kimberly [name], [place] 11/18/2021 10:15 A M Medical Record Number: [id number] Patient Account Number: [id number] Date of Birth/Sex: Treating RN: [birth year] (76 y.o. Kimberly [name] Primary Care Kimberly [name]: Cathlean Cower Other Clinician: Referring Kimberly [name]: Treating Kimberly [name]/Extender: [name] in Treatment: 6 Visit Information History Since Last Visit All ordered tests and consults were completed: No Patient Arrived: Ambulatory Added or deleted any medications: No Arrival Time: 10:13 Any new allergies or adverse reactions: No Accompanied By: self Had a fall or experienced change in No Transfer Assistance: None activities of daily living that may affect Patient Identification Verified: Yes risk of falls: Secondary Verification Process Completed: Yes Signs or symptoms of abuse/neglect since last visito No Patient Requires Transmission-Based Precautions: No Hospitalized since last visit: No Patient Has Alerts: No Implantable device outside of the clinic excluding No cellular tissue based products placed in the center since last visit: Has Dressing in Place as Prescribed: Yes Pain Present Now: No Electronic Signature(s) Signed: 11/19/2021 9:19:06 AM By: Kimberly Coria RN Entered By: Kimberly [name] on 11/18/2021 10:18:40 -------------------------------------------------------------------------------- Clinic Level of Care Assessment Details Patient Name: Date of Service: Kimberly [name], [place] 11/18/2021 10:15 A M Medical Record Number: [id number] Patient Account Number: [id number] Date of Birth/Sex: Treating RN: [birth year] (76 y.o. Kimberly [name] Primary Care Kimberly [name]: Cathlean Cower Other Clinician: Referring Kimberly [name]: Treating Kimberly [name]/Extender: [name] in Treatment: 6 Clinic Level of Care  Assessment Items TOOL 4 Quantity Score X- 1 0 Use when only an EandM is performed on FOLLOW-UP visit ASSESSMENTS - Nursing Assessment / Reassessment X- 1 10 Reassessment of Co-morbidities (includes updates in patient status) X- 1 5 Reassessment of Adherence to Treatment Plan Kimberly, [name] ([id number]) [id number] Page 2 of 8 ASSESSMENTS - Wound and Skin A ssessment / Reassessment X - Simple Wound Assessment / Reassessment - one wound 1 5 '[]'$  - 0 Complex Wound Assessment / Reassessment - multiple wounds '[]'$  - 0 Dermatologic / Skin Assessment (not related to wound area) ASSESSMENTS - Focused Assessment '[]'$  - 0 Circumferential Edema Measurements - multi extremities '[]'$  - 0 Nutritional Assessment / Counseling / Intervention '[]'$  - 0 Lower Extremity Assessment (monofilament, tuning fork, pulses) '[]'$  - 0 Peripheral Arterial Disease Assessment (using hand held doppler) ASSESSMENTS - Ostomy and/or Continence Assessment and Care '[]'$  - 0 Incontinence Assessment and Management '[]'$  - 0 Ostomy Care Assessment and Management (repouching, etc.) PROCESS - Coordination of Care X - Simple Patient / Family Education for ongoing care 1 15 '[]'$  - 0 Complex (extensive) Patient / Family Education for ongoing care '[]'$  - 0 Staff obtains Programmer, systems, Records, T Results / Process Orders est '[]'$  - 0 Staff telephones HHA, Nursing Homes / Clarify orders / etc '[]'$  - 0 Routine Transfer to another Facility (non-emergent condition) '[]'$  - 0 Routine Hospital Admission (non-emergent condition) '[]'$  - 0 New Admissions / Biomedical engineer / Ordering NPWT Apligraf, etc. , '[]'$  - 0 Emergency Hospital Admission (emergent condition) X- 1 10 Simple Discharge Coordination '[]'$  - 0 Complex (extensive) Discharge Coordination PROCESS - Special Needs '[]'$  - 0 Pediatric / Minor Patient Management '[]'$  - 0 Isolation Patient Management '[]'$  - 0 Hearing / Language / Visual special needs '[]'$  - 0 Assessment  of Community assistance (transportation, D/C planning, etc.) '[]'$  - 0 Additional assistance / Altered mentation '[]'$  - 0 Support Surface(s) Assessment (bed, cushion, seat, etc.)  INTERVENTIONS - Wound Cleansing / Measurement X - Simple Wound Cleansing - one wound 1 5 '[]'$  - 0 Complex Wound Cleansing - multiple wounds X- 1 5 Wound Imaging (photographs - any number of wounds) '[]'$  - 0 Wound Tracing (instead of photographs) X- 1 5 Simple Wound Measurement - one wound '[]'$  - 0 Complex Wound Measurement - multiple wounds INTERVENTIONS - Wound Dressings X - Small Wound Dressing one or multiple wounds 1 10 '[]'$  - 0 Medium Wound Dressing one or multiple wounds '[]'$  - 0 Large Wound Dressing one or multiple wounds '[]'$  - 0 Application of Medications - topical '[]'$  - 0 Application of Medications - injection INTERVENTIONS - Miscellaneous '[]'$  - 0 External ear exam Kimberly, [name] ([id number]) [phone].pdf Page 3 of 8 '[]'$  - 0 Specimen Collection (cultures, biopsies, blood, body fluids, etc.) '[]'$  - 0 Specimen(s) / Culture(s) sent or taken to Lab for analysis '[]'$  - 0 Patient Transfer (multiple staff / Kimberly [name] Lift / Similar devices) '[]'$  - 0 Simple Staple / Suture removal (25 or less) '[]'$  - 0 Complex Staple / Suture removal (26 or more) '[]'$  - 0 Hypo / Hyperglycemic Management (close monitor of Blood Glucose) '[]'$  - 0 Ankle / Brachial Index (ABI) - do not check if billed separately X- 1 5 Vital Signs Has the patient been seen at the hospital within the last three years: Yes Total Score: 75 Level Of Care: New/Established - Level 2 Electronic Signature(s) Signed: 11/19/2021 9:19:06 AM By: Kimberly Coria RN Entered By: Kimberly [name] on 11/18/2021 10:27:21 -------------------------------------------------------------------------------- Encounter Discharge Information Details Patient Name: Date of Service: Kimberly [name], Kimberly [name]. 11/18/2021 10:15 A M Medical Record Number: [id number] Patient  Account Number: [id number] Date of Birth/Sex: Treating RN: 1945/03/08 (75 y.o. Kimberly [name] Primary Care Clive Parcel: Cathlean Cower Other Clinician: Referring Leily Capek: Treating Dominick Zertuche/Extender: [name] in Treatment: 6 Encounter Discharge Information Items Discharge Condition: Stable Ambulatory Status: Ambulatory Discharge Destination: Home Transportation: Private Auto Accompanied By: self Schedule Follow-up Appointment: Yes Clinical Summary of Care: Electronic Signature(s) Signed: 11/19/2021 9:19:06 AM By: Kimberly Coria RN Entered By: Kimberly [name] on 11/18/2021 10:28:02 Lower Extremity Assessment Details -------------------------------------------------------------------------------- Kimberly [name] ([id number]) [id number] Page 4 of 8 Patient Name: Date of Service: Kimberly [name], [place] 11/18/2021 10:15 A M Medical Record Number: [id number] Patient Account Number: [id number] Date of Birth/Sex: Treating RN: Sep 22, 1945 (76 y.o. Kimberly [name] Primary Care Onyx Schirmer: Cathlean Cower Other Clinician: Referring Ameli Sangiovanni: Treating Marlissa Emerick/Extender: [name] in Treatment: 6 Edema Assessment Left: Right: Assessed: No No Edema: Yes Calf Left: Right: Point of Measurement: 30 cm From Medial Instep 28 cm Ankle Left: Right: Point of Measurement: 10 cm From Medial Instep 20 cm Vascular Assessment Left: Right: Pulses: Dorsalis Pedis Palpable: Yes Electronic Signature(s) Signed: 11/19/2021 9:19:06 AM By: Kimberly Coria RN Entered By: Kimberly [name] on 11/18/2021 10:25:44 -------------------------------------------------------------------------------- Multi Wound Chart Details Patient Name: Date of Service: Kimberly [name], Kimberly [name]. 11/18/2021 10:15 A M Medical Record Number: [id number] Patient Account Number: [id number] Date of Birth/Sex: Treating RN: 29-Jan-1945 (76 y.o. Kimberly [name] Primary Care Jaquese Irving: Cathlean Cower Other  Clinician: Referring Jessenya Berdan: Treating Stepheny Canal/Extender: [name] in Treatment: 6 Vital Signs Height(in): 61 Pulse(bpm): 62 Weight(lbs): 106 Blood Pressure(mmHg): 105/64 Body Mass Index(BMI): 20 Temperature(F): 98.1 Respiratory Rate(breaths/min): 18 [4:Photos:] [N/A:N/A] Left, Lateral Lower Leg N/A N/A Wound Location: Surgical Injury N/A N/A Wounding Event: ANALEI, WHINERY ([id number]) [phone].pdf Page 5 of 8 Dehisced Wound N/A N/A Primary  Etiology: Anemia, Asthma, Chronic Obstructive N/A N/A Comorbid History: Pulmonary Disease (COPD), Congestive Heart Failure, Hypertension, Myocardial Infarction, Peripheral Arterial Disease 04/03/2021 N/A N/A Date Acquired: 6 N/A N/A Weeks of Treatment: Open N/A N/A Wound Status: No N/A N/A Wound Recurrence: 0x0x0 N/A N/A Measurements L x W x D (cm) 0 N/A N/A A (cm) : rea 0 N/A N/A Volume (cm) : 100.00% N/A N/A % Reduction in Area: 100.00% N/A N/A % Reduction in Volume: Full Thickness Without Exposed N/A N/A Classification: Support Structures None Present N/A N/A Exudate Amount: None Present (0%) N/A N/A Granulation Amount: None Present (0%) N/A N/A Necrotic Amount: Fascia: No N/A N/A Exposed Structures: Fat Layer (Subcutaneous Tissue): No Tendon: No Muscle: No Joint: No Bone: No None N/A N/A Epithelialization: Treatment Notes Wound #4 (Lower Leg) Wound Laterality: Left, Lateral Cleanser Peri-Wound Care Topical Primary Dressing Secondary Dressing Secured With Compression Wrap Compression Stockings Add-Ons Electronic Signature(s) Signed: 11/18/2021 10:34:56 AM By: Kimberly Coria RN Entered By: Kimberly [name] on 11/18/2021 10:34:56 -------------------------------------------------------------------------------- Multi-Disciplinary Care Plan Details Patient Name: Date of Service: Kimberly [name], [place] [name]. 11/18/2021 10:15 A M Medical Record Number: [id number] Patient  Account Number: [id number] Date of Birth/Sex: Treating RN: 05-02-1945 (76 y.o. Kimberly [name] Primary Care Rachele Lamaster: Cathlean Cower Other Clinician: Referring Taleeyah Bora: Treating Mahiya Kercheval/Extender: [name] in Treatment: [address] [place], [place] [name] ([id number]) [id number] Page 6 of 8 Electronic Signature(s) Signed: 11/18/2021 10:34:07 AM By: Kimberly Coria RN Entered By: Kimberly [name] on 11/18/2021 10:34:07 -------------------------------------------------------------------------------- Pain Assessment Details Patient Name: Date of Service: Kimberly [name], [place] 11/18/2021 10:15 A M Medical Record Number: [id number] Patient Account Number: [id number] Date of Birth/Sex: Treating RN: 1945/03/19 (76 y.o. Kimberly [name] Primary Care Kamylah Manzo: Cathlean Cower Other Clinician: Referring Hudson Majkowski: Treating Damonique Brunelle/Extender: [name] in Treatment: 6 Active Problems Location of Pain Severity and Description of Pain Patient Has Paino No Site Locations Pain Management and Medication Current Pain Management: Electronic Signature(s) Signed: 11/19/2021 9:19:06 AM By: Kimberly Coria RN Entered By: Kimberly [name] on 11/18/2021 10:19:03 Patient/Caregiver Education Details -------------------------------------------------------------------------------- Kimberly [name] ([id number]) [id number] Page 7 of 8 Patient Name: Date of Service: Kimberly [name], [place] 10/30/2023andnbsp10:15 A M Medical Record Number: [id number] Patient Account Number: [id number] Date of Birth/Gender: Treating RN: [birth year] (77 y.o. Kimberly [name] Primary Care Physician: Cathlean Cower Other Clinician: Referring Physician: Treating Physician/Extender: [name] in Treatment: 6 Education Assessment Education Provided To: Patient Education Topics Provided Wound/Skin Impairment: Methods: Explain/Verbal Responses: State content  correctly Electronic Signature(s) Signed: 11/19/2021 9:19:06 AM By: Kimberly Coria RN Entered By: Kimberly [name] on 11/18/2021 10:27:35 -------------------------------------------------------------------------------- Wound Assessment Details Patient Name: Date of Service: Kimberly [name], Kimberly [name]. 11/18/2021 10:15 A M Medical Record Number: [id number] Patient Account Number: [id number] Date of Birth/Sex: Treating RN: May 20, 1945 (76 y.o. Kimberly [name] Primary Care Leylah Tarnow: Cathlean Cower Other Clinician: Referring Ahliya Glatt: Treating Alando Colleran/Extender: [name] in Treatment: 6 Wound Status Wound Number: 4 Primary Dehisced Wound Etiology: Wound Location: Left, Lateral Lower Leg Wound Open Wounding Event: Surgical Injury Status: Date Acquired: 04/03/2021 Comorbid Anemia, Asthma, Chronic Obstructive Pulmonary Disease (COPD), Weeks Of Treatment: 6 History: Congestive Heart Failure, Hypertension, Myocardial Infarction, Clustered Wound: No Peripheral Arterial Disease Photos Wound Measurements Length: (cm) Width: (cm) Depth: (cm) Area: (cm) Kimberly [name], Kimberly [name] ([id number]) Volume: (cm) 0 % Reduction in Area: 100% 0 % Reduction in Volume: 100% 0 Epithelialization: None 0 Tunneling: No [id number] Page 8 of 8  0 Undermining: No Wound Description Classification: Full Thickness Without Exposed Support Structures Exudate Amount: None Present Foul Odor After Cleansing: No Slough/Fibrino No Wound Bed Granulation Amount: None Present (0%) Exposed Structure Necrotic Amount: None Present (0%) Fascia Exposed: No Fat Layer (Subcutaneous Tissue) Exposed: No Tendon Exposed: No Muscle Exposed: No Joint Exposed: No Bone Exposed: No Electronic Signature(s) Signed: 11/19/2021 9:19:06 AM By: Kimberly Coria RN Entered By: Kimberly [name] on 11/18/2021 10:22:29 -------------------------------------------------------------------------------- [place] Details Patient  Name: Date of Service: Kimberly [name], Kimberly [name]. 11/18/2021 10:15 A M Medical Record Number: [id number] Patient Account Number: [id number] Date of Birth/Sex: Treating RN: 07-23-1945 (76 y.o. Kimberly [name] Primary Care Anders Hohmann: Cathlean Cower Other Clinician: Referring Aidah Forquer: Treating Kayvion Arneson/Extender: [name] in Treatment: 6 Vital Signs Time Taken: 10:18 Temperature (F): 98.1 Height (in): 61 Pulse (bpm): 62 Weight (lbs): 106 Respiratory Rate (breaths/min): 18 Body Mass Index (BMI): 20 Blood Pressure (mmHg): 105/64 Reference Range: 80 - 120 mg / dl Electronic Signature(s) Signed: 11/19/2021 9:19:06 AM By: Kimberly Coria RN Entered By: Kimberly [name] on 11/18/2021 10:18:56

## 2021-11-18 NOTE — Progress Notes (Signed)
Kimberly, DEMERITT [name] ([id number]) [id number] Page 1 of 7 Visit Report for 11/18/2021 Chief Complaint Document Details Patient Name: Date of Service: Kimberly [name], [place] 11/18/2021 10:15 A M Medical Record Number: [id number] Patient Account Number: [id number] Date of Birth/Sex: Treating RN: Aug 01, 1945 (76 y.o. [name] Primary Care Provider: Cathlean Cower Other Clinician: Referring Provider: Treating Provider/Extender: [name] in Treatment: 6 Information Obtained from: Patient Chief Complaint Left LE surgical ulcer Electronic Signature(s) Signed: 11/18/2021 10:05:35 AM By: Worthy Keeler PA-C Entered By: Worthy Keeler on 11/18/2021 10:05:35 -------------------------------------------------------------------------------- HPI Details Patient Name: Date of Service: Kimberly [name], [place] [name]. 11/18/2021 10:15 A M Medical Record Number: [id number] Patient Account Number: [id number] Date of Birth/Sex: Treating RN: 11/13/1945 (76 y.o. [name] Primary Care Provider: Cathlean Cower Other Clinician: Referring Provider: Treating Provider/Extender: [name] in Treatment: 6 History of Present Illness HPI Description: 76 year old patient was seen by the nurse practitioner at the [facility] primary care for a wound which has been there on her right leg since August 2018. For a period of time she has been treated with doxycycline, clindamycin and Bactrim and the wound continues to have drainage and odor.on 11/20/2016 she was again put on Bactrim DS and referred to the wound center. Past medical history significant for hypertension, congestive heart failure, COPD, dyslipidemia, migraines, pericarditis, status post VSD repair, cholecystectomy tubal ligation, cesarean section, coronary angiogram,my tenotomy with tube placement on the right,dilated cardiomyopathy,tobacco abuse and has been a smoker for the last 35 years. She also has a history  of right great saphenous vein ablation but the patient does not confirm this for sure. She has had a venous duplex study done in August 2018 which showed no evidence of lower extremity deep or superficial venous thrombus or incompetence bilaterally. She has a arterial duplex study pending, which is going to be done later today. 12/22/16 on evaluation today patient appears to be doing fairly well in regard to her right lateral lower extremity wounds. She did undergo a right common femoral artery balloon angioplasty and stent placement which was performed on 12/17/16. Patient this is did appear to be a sufficient improvement according to the reviewed notes which is excellent news. Prior to this procedure patient did have a study of the lower extremity this was a Doppler performed on 12/08/16 this revealed moderate right lower extremity arterial disease involving the common femoral artery and iliac segment. There was also left arterial obstruction involving the iliac segment. On the left there was arterial obstruction involving the iliac segment, common femoral artery, and peroneal artery. Patient states she is still having some soreness left over and residual from the procedure. However overall she appears to be doing very well. She does have a follow-up [name], [name] ([id number]) [id number] Page 2 of 7 study with vascular in the next week. 01/02/2017 -- was seen by Dr. Jacqulyn Cane -- she had an abdominal aortogram with lower extremity peripheral vascular intervention, and this showed moderate left common iliac artery stenosis. Short occlusion of the right common femoral artery with no significant infrainguinal disease. Short occlusion of the left common femoral artery with no significant infrainguinal disease and a successful drug-coated balloon angioplasty stent was placed in the right common femoral artery. She had a lower extremity arterial duplex examination done  which showed the resting right ABI is within normal range and no evidence of significant right lower extremity arterial disease. right ABI is 1.08 with a toe  pressure being 0.54. there was biphasic flow. The left ABI was 0.77 and the toe and this was 0.38 with monophasic flow.The right toe brachial indicis were abnormal. The left resting ABI indicates moderate left lower extremity arterial disease in the left toe brachial index is abnormal. 01/09/17 on evaluation today patient presents with continued ulcerations noted of the right lower extremity. This does seem to be doing a little bit better but still is slough covered. She has been tolerating the dressing changes fairly well but states that the debridement does tend to bother her pain wise. No fevers, chills, nausea, or vomiting noted at this time. She does tell me that the alginate dressing that she was looking to use following her last evaluation is expensive and insurance will not cover this it's actually cheaper for her to utilize the [place]. 02/06/17 on evaluation today patient's wound appears to be doing excellent at this point in time. In fact she has been tolerating the dressing changes very well there does not even appear to be any evidence or need for debridement today. She's not having any significant pain which is good news. No fevers, chills, nausea, or vomiting noted at this time. Overall she is extremely pleased with how things have progressed. 02/20/17 on evaluation today patient appears to be doing well in regard to her right lower extremity ulcer. In fact this appears to be completely healed. She still has some dry pinpoint scab like areas noted over the lower extremity but fortunately nothing significant and none of these seem to have any wound underneath when they come off it is completely healed underneath. Nonetheless we are gonna let this work itself off not try to do anything to invasive. 03/06/17 on evaluation today patient  appears to be doing extremely well in regard to her right lateral lower extremity ulcer site. She appears to be completely healed and there is no evidence of residual ulceration at this time which is great news. Readmission: 10/08/2020 upon evaluation today patient appears to be doing somewhat poorly in regard to a wound on her right leg. She tells me about a month ago one of her dogs scratched her and then subsequently this developed into a raised area that she has been concerned with. Fortunately there does not appear to be any pain per se with regard to this area. Unfortunately there is some concern in my mind to be honest about the possibility of a cancerous lesion here. Initially the plan was to actually do a punch biopsy as I proceeded with the procedure it became apparent that that was not necessarily going to be the best way to go I ended up removing the entirety of the raised area. It was somewhat coming loose just after removing the punch anyway. . The patient does have peripheral vascular disease though she recently had a stent placed. She also has a defibrillator pacemaker at this point. She does have lymphedema and does continue to smoke. She has no desire to quit. 10/18/2020 since I last saw this patient we have actually made a referral for her to have further evaluation and treatment with the skin surgery center we also tried [facility] dermatology she really needs a Mohs surgeon. Nonetheless screens were dermatology was not taking any new patients whatsoever and at the skin surgery center in [place] this is good to be something they should be calling her to get scheduled hopefully shortly. Nonetheless she is actually doing better with the wound measuring significantly smaller despite the cancer diagnosis.  Nonetheless I still think there is some work to do as far as what she is getting need from an excision standpoint. 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 10/25/2020 upon evaluation today patient presents for follow-up with regard to her right thigh ulcer. This actually appears to be completely healed. Again this was diagnosed as being positive for squamous cell carcinoma. Subsequently it still healed after we removed everything that we did. Unfortunately the margins were not clear so she still does have to go to the skin surgery center and she will be seeing them on 25 October. Readmission: 10-07-2021 upon evaluation today patient appears to be doing poorly in regard to the wound that that as a result of a surgical resection of a skin cancer performed by Dr. Garth Schlatter. This was performed on 04-03-2021 and has been very slow to heal. Fortunately there does not appear to be any signs of active infection at this time which is great news and overall very pleased in that regard there is some necrotic tissue however on the surface of the wound. With that being said the patient has not had any recent debridements. She also has not had any recent arterial studies her ABI today was 0.53 on the left although in cleaning the wound actually appears she has pretty good blood flow at this point. Patient's past medical history really has not changed significantly she still is a current every day smoker. 10-21-2021 upon evaluation today patient appears to be doing well currently in regard to her wound in fact this is significantly improved compared to last time I saw her. Fortunately there does not appear to be any evidence of active infection locally or systemically at this time. No fevers, chills, nausea, vomiting, or diarrhea. 11-04-2021 upon evaluation today patient appears to be doing well currently in regard to her wound although it does look like she may have been scrubbing this a little bit too briskly. I think that is led to this breaking down a little bit unfortunately although is not really an open area too  severely it definitely is still open nonetheless. 11-18-2021 upon evaluation today patient appears to be doing well currently in regard to her wounds. In fact everything appears to be completely healed based on what I am seeing. Fortunately I do not see any evidence of active infection locally or systemically at this time which is great news. No fevers, chills, nausea, vomiting, or diarrhea. Electronic Signature(s) Signed: 11/18/2021 10:36:20 AM By: Worthy Keeler PA-C Entered By: Worthy Keeler on 11/18/2021 10:36:20 Gustavus Bryant [name] ([id number]) [id number] Page 3 of 7 -------------------------------------------------------------------------------- Physical Exam Details Patient Name: Date of Service: Kimberly [name], [place] 11/18/2021 10:15 A M Medical Record Number: [id number] Patient Account Number: [id number] Date of Birth/Sex: Treating RN: 27-May-1945 (76 y.o. [name] Primary Care Provider: Cathlean Cower Other Clinician: Referring Provider: Treating Provider/Extender: [name] in Treatment: 6 Constitutional Well-nourished and well-hydrated in no acute distress. Respiratory normal breathing without difficulty. Psychiatric this patient is able to make decisions and demonstrates good insight into disease process. Alert and Oriented x 3. pleasant and cooperative. Notes Upon inspection patient's wound bed actually showed signs of complete epithelization I am actually very pleased with where we stand and I think that she is making excellent progress. Fortunately there is no sign of active infection locally or systemically at this time. Electronic Signature(s) Signed: 11/18/2021 10:37:58 AM By:  Melburn Hake, Gabryela Kimbrell PA-C Entered By: Worthy Keeler on 11/18/2021 10:37:58 -------------------------------------------------------------------------------- Physician Orders Details Patient Name: Date of Service: Kimberly [name], [place] 11/18/2021 10:15 A  M Medical Record Number: [id number] Patient Account Number: [id number] Date of Birth/Sex: Treating RN: 1945-02-23 (76 y.o. [name] Primary Care Provider: Cathlean Cower Other Clinician: Referring Provider: Treating Provider/Extender: [name] in Treatment: 6 Verbal / Phone Orders: No Diagnosis Coding ICD-10 Coding Code Description T81.31XA Disruption of external operation (surgical) wound, not elsewhere classified, initial encounter L97.822 Non-pressure chronic ulcer of other part of left lower leg with fat layer exposed I73.9 Peripheral vascular disease, unspecified I89.0 Lymphedema, not elsewhere classified F17.218 Nicotine dependence, cigarettes, with other nicotine-induced disorders Discharge From [place] ([id number]) [id number] Page 4 of 7 Discharge from [place] [facility] Complete - AandD ointment daily times 1 week . tubi grip size c single Electronic Signature(s) Signed: 11/18/2021 4:57:47 PM By: Worthy Keeler PA-C Signed: 11/19/2021 9:19:06 AM By: [name] RN Entered By: [name] on 11/18/2021 10:26:53 -------------------------------------------------------------------------------- Problem List Details Patient Name: Date of Service: Kimberly [name], [place] [name]. 11/18/2021 10:15 A M Medical Record Number: [id number] Patient Account Number: [id number] Date of Birth/Sex: Treating RN: [birth year] (76 y.o. [name] Primary Care Provider: Cathlean Cower Other Clinician: Referring Provider: Treating Provider/Extender: [name] in Treatment: 6 Active Problems ICD-10 Encounter Code Description Active Date MDM Diagnosis T81.31XA Disruption of external operation (surgical) wound, not elsewhere classified, 10/07/2021 No Yes initial encounter L97.822 Non-pressure chronic ulcer of other part of left lower leg with fat layer exposed9/18/2023 No Yes I73.9 Peripheral vascular  disease, unspecified 10/07/2021 No Yes I89.0 Lymphedema, not elsewhere classified 10/07/2021 No Yes F17.218 Nicotine dependence, cigarettes, with other nicotine-induced disorders 10/07/2021 No Yes Inactive Problems Resolved Problems Electronic Signature(s) Signed: 11/18/2021 10:05:27 AM By: Worthy Keeler PA-C Entered By: Worthy Keeler on 11/18/2021 10:05:27 Gustavus Bryant [name] ([id number]) [id number] Page 5 of 7 -------------------------------------------------------------------------------- Progress Note Details Patient Name: Date of Service: Kimberly [name], [place] 11/18/2021 10:15 A M Medical Record Number: [id number] Patient Account Number: [id number] Date of Birth/Sex: Treating RN: May 04, 1945 (76 y.o. [name] Primary Care Provider: Cathlean Cower Other Clinician: Referring Provider: Treating Provider/Extender: [name] in Treatment: 6 Subjective Chief Complaint Information obtained from Patient Left LE surgical ulcer History of Present Illness (HPI) 76 year old patient was seen by the nurse practitioner at the [facility] primary care for a wound which has been there on her right leg since August 2018. For a period of time she has been treated with doxycycline, clindamycin and Bactrim and the wound continues to have drainage and odor.on 11/20/2016 she was again put on Bactrim DS and referred to the wound center. Past medical history significant for hypertension, congestive heart failure, COPD, dyslipidemia, migraines, pericarditis, status post VSD repair, cholecystectomy tubal ligation, cesarean section, coronary angiogram,my tenotomy with tube placement on the right,dilated cardiomyopathy,tobacco abuse and has been a smoker for the last 35 years. She also has a history of right great saphenous vein ablation but the patient does not confirm this for sure. She has had a venous duplex study done in August 2018 which showed no evidence of lower  extremity deep or superficial venous thrombus or incompetence bilaterally. She has a arterial duplex study pending, which is going to be done later today. 12/22/16 on evaluation today patient appears to be doing fairly well in regard to her right lateral  lower extremity wounds. She did undergo a right common femoral artery balloon angioplasty and stent placement which was performed on 12/17/16. Patient this is did appear to be a sufficient improvement according to the reviewed notes which is excellent news. Prior to this procedure patient did have a study of the lower extremity this was a Doppler performed on 12/08/16 this revealed moderate right lower extremity arterial disease involving the common femoral artery and iliac segment. There was also left arterial obstruction involving the iliac segment. On the left there was arterial obstruction involving the iliac segment, common femoral artery, and peroneal artery. Patient states she is still having some soreness left over and residual from the procedure. However overall she appears to be doing very well. She does have a follow-up study with vascular in the next week. 01/02/2017 -- was seen by Dr. Jacqulyn Cane -- she had an abdominal aortogram with lower extremity peripheral vascular intervention, and this showed moderate left common iliac artery stenosis. Short occlusion of the right common femoral artery with no significant infrainguinal disease. Short occlusion of the left common femoral artery with no significant infrainguinal disease and a successful drug-coated balloon angioplasty stent was placed in the right common femoral artery. She had a lower extremity arterial duplex examination done which showed the resting right ABI is within normal range and no evidence of significant right lower extremity arterial disease. right ABI is 1.08 with a toe pressure being 0.54. there was biphasic flow. The left ABI was 0.77 and the toe and this was 0.38  with monophasic flow.The right toe brachial indicis were abnormal. The left resting ABI indicates moderate left lower extremity arterial disease in the left toe brachial index is abnormal. 01/09/17 on evaluation today patient presents with continued ulcerations noted of the right lower extremity. This does seem to be doing a little bit better but still is slough covered. She has been tolerating the dressing changes fairly well but states that the debridement does tend to bother her pain wise. No fevers, chills, nausea, or vomiting noted at this time. She does tell me that the alginate dressing that she was looking to use following her last evaluation is expensive and insurance will not cover this it's actually cheaper for her to utilize the [place]. 02/06/17 on evaluation today patient's wound appears to be doing excellent at this point in time. In fact she has been tolerating the dressing changes very well there does not even appear to be any evidence or need for debridement today. She's not having any significant pain which is good news. No fevers, chills, nausea, or vomiting noted at this time. Overall she is extremely pleased with how things have progressed. 02/20/17 on evaluation today patient appears to be doing well in regard to her right lower extremity ulcer. In fact this appears to be completely healed. She still has some dry pinpoint scab like areas noted over the lower extremity but fortunately nothing significant and none of these seem to have any wound underneath when they come off it is completely healed underneath. Nonetheless we are gonna let this work itself off not try to do anything to invasive. 03/06/17 on evaluation today patient appears to be doing extremely well in regard to her right lateral lower extremity ulcer site. She appears to be completely healed and there is no evidence of residual ulceration at this time which is great news. Readmission: 10/08/2020 upon evaluation today  patient appears to be doing somewhat poorly in regard to a wound on  her right leg. She tells me about a month ago one of her dogs scratched her and then subsequently this developed into a raised area that she has been concerned with. Fortunately there does not appear to be any pain per se with regard to this area. Unfortunately there is some concern in my mind to be honest about the possibility of a cancerous lesion here. Initially the plan was to actually do a punch biopsy as I proceeded with the procedure it became apparent that that was not necessarily going to be the best way to go I ended up removing the entirety of the raised area. It was somewhat coming loose just after removing the punch anyway. . The patient does have peripheral vascular disease though she recently had a stent placed. She also has a defibrillator pacemaker at this point. She does have lymphedema and does continue to smoke. She has no desire to quit. 10/18/2020 since I last saw this patient we have actually made a referral for her to have further evaluation and treatment with the skin surgery center we also tried [facility] dermatology she really needs a Mohs surgeon. Nonetheless screens were dermatology was not taking any new patients whatsoever and at [facility] ([id number]) [id number] Page 6 of 7 the skin surgery center in [place] this is good to be something they should be calling her to get scheduled hopefully shortly. Nonetheless she is actually doing better with the wound measuring significantly smaller despite the cancer diagnosis. Nonetheless I still think there is some work to do as far as what she is getting need from an excision standpoint. 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 10/25/2020 upon evaluation today patient presents for follow-up with regard to her right thigh ulcer. This actually appears to be  completely healed. Again this was diagnosed as being positive for squamous cell carcinoma. Subsequently it still healed after we removed everything that we did. Unfortunately the margins were not clear so she still does have to go to the skin surgery center and she will be seeing them on 25 October. Readmission: 10-07-2021 upon evaluation today patient appears to be doing poorly in regard to the wound that that as a result of a surgical resection of a skin cancer performed by Dr. Garth Schlatter. This was performed on 04-03-2021 and has been very slow to heal. Fortunately there does not appear to be any signs of active infection at this time which is great news and overall very pleased in that regard there is some necrotic tissue however on the surface of the wound. With that being said the patient has not had any recent debridements. She also has not had any recent arterial studies her ABI today was 0.53 on the left although in cleaning the wound actually appears she has pretty good blood flow at this point. Patient's past medical history really has not changed significantly she still is a current every day smoker. 10-21-2021 upon evaluation today patient appears to be doing well currently in regard to her wound in fact this is significantly improved compared to last time I saw her. Fortunately there does not appear to be any evidence of active infection locally or systemically at this time. No fevers, chills, nausea, vomiting, or diarrhea. 11-04-2021 upon evaluation today patient appears to be doing well currently in regard to her wound although it does look like she may have been scrubbing this a little bit too briskly. I think  that is led to this breaking down a little bit unfortunately although is not really an open area too severely it definitely is still open nonetheless. 11-18-2021 upon evaluation today patient appears to be doing well currently in regard to her wounds. In fact everything appears to be  completely healed based on what I am seeing. Fortunately I do not see any evidence of active infection locally or systemically at this time which is great news. No fevers, chills, nausea, vomiting, or diarrhea. Objective Constitutional Well-nourished and well-hydrated in no acute distress. Vitals Time Taken: 10:18 AM, Height: 61 in, Weight: 106 lbs, BMI: 20, Temperature: 98.1 F, Pulse: 62 bpm, Respiratory Rate: 18 breaths/min, Blood Pressure: 105/64 mmHg. Respiratory normal breathing without difficulty. Psychiatric this patient is able to make decisions and demonstrates good insight into disease process. Alert and Oriented x 3. pleasant and cooperative. General Notes: Upon inspection patient's wound bed actually showed signs of complete epithelization I am actually very pleased with where we stand and I think that she is making excellent progress. Fortunately there is no sign of active infection locally or systemically at this time. Integumentary (Hair, Skin) Wound #4 status is Open. Original cause of wound was Surgical Injury. The date acquired was: 04/03/2021. The wound has been in treatment 6 weeks. The wound is located on the Left,Lateral Lower Leg. The wound measures 0cm length x 0cm width x 0cm depth; 0cm^2 area and 0cm^3 volume. There is no tunneling or undermining noted. There is a none present amount of drainage noted. There is no granulation within the wound bed. There is no necrotic tissue within the wound bed. Assessment Active Problems ICD-10 Disruption of external operation (surgical) wound, not elsewhere classified, initial encounter Non-pressure chronic ulcer of other part of left lower leg with fat layer exposed Peripheral vascular disease, unspecified Lymphedema, not elsewhere classified Nicotine dependence, cigarettes, with other nicotine-induced disorders Plan Discharge From [facility] Services: Discharge from [facility] Complete - AandD ointment daily  times 1 week . tubi grip size c single TANASHIA, CIESLA [name] ([id number]) [id number] Page 7 of 7 1. Based on what I am seeing I do believe the patient is ready for discharge today. Fortunately there does not appear to be any signs of active infection at this time which is great news. No fevers, chills, nausea, vomiting, or diarrhea. 2. I am also can recommend that we have her use some AandD ointment over the area that is healed in order to protect and moisturize the region. 3 also is going to suggest that she use Tubigrip size C over the next month in order to allow this to toughen up and if she remains closed by that time I think she will need to wear this going forward. We will see her back for follow-up visit as needed. Electronic Signature(s) Signed: 11/18/2021 10:38:53 AM By: Worthy Keeler PA-C Entered By: Worthy Keeler on 11/18/2021 10:38:52 -------------------------------------------------------------------------------- SuperBill Details Patient Name: Date of Service: Kimberly [name], [place] 11/18/2021 Medical Record Number: [id number] Patient Account Number: [id number] Date of Birth/Sex: Treating RN: [birth year] (76 y.o. [name] Primary Care Provider: Cathlean Cower Other Clinician: Referring Provider: Treating Provider/Extender: [name] in Treatment: 6 Diagnosis Coding ICD-10 Codes Code Description T81.31XA Disruption of external operation (surgical) wound, not elsewhere classified, initial encounter L97.822 Non-pressure chronic ulcer of other part of left lower leg with fat layer exposed I73.9 Peripheral vascular disease, unspecified I89.0 Lymphedema, not elsewhere classified F17.218 Nicotine dependence, cigarettes, with  other nicotine-induced disorders Facility Procedures : CPT4 Code: [id number] Description: [id number] - WOUND CARE VISIT-LEV 2 EST PT Modifier: Quantity: 1 Physician Procedures : CPT4 Code Description Modifier [id number]  [id number] - WC PHYS LEVEL 3 - EST PT ICD-10 Diagnosis Description T81.31XA Disruption of external operation (surgical) wound, not elsewhere classified, initial encounter L97.822 Non-pressure chronic ulcer of other  part of left lower leg with fat layer exposed I73.9 Peripheral vascular disease, unspecified I89.0 Lymphedema, not elsewhere classified Quantity: 1 Electronic Signature(s) Signed: 11/18/2021 10:39:18 AM By: Worthy Keeler PA-C Entered By: Worthy Keeler on 11/18/2021 10:39:18

## 2021-12-02 ENCOUNTER — Ambulatory Visit (INDEPENDENT_AMBULATORY_CARE_PROVIDER_SITE_OTHER): Payer: Medicare Other

## 2021-12-02 VITALS — Ht 61.0 in

## 2021-12-02 DIAGNOSIS — Z0000 Encounter for general adult medical examination without abnormal findings: Secondary | ICD-10-CM | POA: Diagnosis not present

## 2021-12-02 NOTE — Patient Instructions (Addendum)
Kimberly [name] , Thank you for taking time to come for your Medicare Wellness Visit. I appreciate your ongoing commitment to your health goals. Please review the following plan we discussed and let me know if I can assist you in the future.   These are the goals we discussed:  Goals       patient is interested in talking more with RN or LCSW about managing caregiver stress issues (pt-stated)      Care Coordination Interventions:   Depression screen reviewed  Active listening / Reflection utilized  Emotional Support Provided  Informed client of services of Care Coordination program         This is a list of the screening recommended for you and due dates:  Health Maintenance  Topic Date Due   Colon Cancer Screening  Never done   Zoster (Shingles) Vaccine (1 of 2) Never done   Mammogram  02/22/2017   Tetanus Vaccine  02/21/2019   COVID-19 Vaccine (3 - Pfizer series) 05/31/2019   Medicare Annual Wellness Visit  12/03/2022   Pneumonia Vaccine  Completed   Flu Shot  Completed   DEXA scan (bone density measurement)  Completed   Hepatitis C Screening: USPSTF Recommendation to screen - Ages 26-79 yo.  Completed   HPV Vaccine  Aged Out    Advanced directives: NO  Conditions/risks identified: YES  Next appointment: Follow up in one year for your annual wellness visit.   Preventive Care 28 Years and Older, Female Preventive care refers to lifestyle choices and visits with your health care provider that can promote health and wellness. What does preventive care include? A yearly physical exam. This is also called an annual well check. Dental exams once or twice a year. Routine eye exams. Ask your health care provider how often you should have your eyes checked. Personal lifestyle choices, including: Daily care of your teeth and gums. Regular physical activity. Eating a healthy diet. Avoiding tobacco and drug use. Limiting alcohol use. Practicing safe sex. Taking low-dose aspirin  every day. Taking vitamin and mineral supplements as recommended by your health care provider. What happens during an annual well check? The services and screenings done by your health care provider during your annual well check will depend on your age, overall health, lifestyle risk factors, and family history of disease. Counseling  Your health care provider may ask you questions about your: Alcohol use. Tobacco use. Drug use. Emotional well-being. Home and relationship well-being. Sexual activity. Eating habits. History of falls. Memory and ability to understand (cognition). Work and work Statistician. Reproductive health. Screening  You may have the following tests or measurements: Height, weight, and BMI. Blood pressure. Lipid and cholesterol levels. These may be checked every 5 years, or more frequently if you are over 68 years old. Skin check. Lung cancer screening. You may have this screening every year starting at age 47 if you have a 30-pack-year history of smoking and currently smoke or have quit within the past 15 years. Fecal occult blood test (FOBT) of the stool. You may have this test every year starting at age 87. Flexible sigmoidoscopy or colonoscopy. You may have a sigmoidoscopy every 5 years or a colonoscopy every 10 years starting at age 3. Hepatitis C blood test. Hepatitis B blood test. Sexually transmitted disease (STD) testing. Diabetes screening. This is done by checking your blood sugar (glucose) after you have not eaten for a while (fasting). You may have this done every 1-3 years. Bone density scan. This  is done to screen for osteoporosis. You may have this done starting at age 49. Mammogram. This may be done every 1-2 years. Talk to your health care provider about how often you should have regular mammograms. Talk with your health care provider about your test results, treatment options, and if necessary, the need for more tests. Vaccines  Your health  care provider may recommend certain vaccines, such as: Influenza vaccine. This is recommended every year. Tetanus, diphtheria, and acellular pertussis (Tdap, Td) vaccine. You may need a Td booster every 10 years. Zoster vaccine. You may need this after age 19. Pneumococcal 13-valent conjugate (PCV13) vaccine. One dose is recommended after age 76. Pneumococcal polysaccharide (PPSV23) vaccine. One dose is recommended after age 31. Talk to your health care provider about which screenings and vaccines you need and how often you need them. This information is not intended to replace advice given to you by your health care provider. Make sure you discuss any questions you have with your health care provider. Document Released: 02/02/2015 Document Revised: 09/26/2015 Document Reviewed: 11/07/2014 Elsevier Interactive Patient Education  2017 [place] Prevention in the Home Falls can cause injuries. They can happen to people of all ages. There are many things you can do to make your home safe and to help prevent falls. What can I do on the outside of my home? Regularly fix the edges of walkways and driveways and fix any cracks. Remove anything that might make you trip as you walk through a door, such as a raised step or threshold. Trim any bushes or trees on the path to your home. Use bright outdoor lighting. Clear any walking paths of anything that might make someone trip, such as rocks or tools. Regularly check to see if handrails are loose or broken. Make sure that both sides of any steps have handrails. Any raised decks and porches should have guardrails on the edges. Have any leaves, snow, or ice cleared regularly. Use sand or salt on walking paths during winter. Clean up any spills in your garage right away. This includes oil or grease spills. What can I do in the bathroom? Use night lights. Install grab bars by the toilet and in the tub and shower. Do not use towel bars as grab  bars. Use non-skid mats or decals in the tub or shower. If you need to sit down in the shower, use a plastic, non-slip stool. Keep the floor dry. Clean up any water that spills on the floor as soon as it happens. Remove soap buildup in the tub or shower regularly. Attach bath mats securely with double-sided non-slip rug tape. Do not have throw rugs and other things on the floor that can make you trip. What can I do in the bedroom? Use night lights. Make sure that you have a light by your bed that is easy to reach. Do not use any sheets or blankets that are too big for your bed. They should not hang down onto the floor. Have a firm chair that has side arms. You can use this for support while you get dressed. Do not have throw rugs and other things on the floor that can make you trip. What can I do in the kitchen? Clean up any spills right away. Avoid walking on wet floors. Keep items that you use a lot in easy-to-reach places. If you need to reach something above you, use a strong step stool that has a grab bar. Keep electrical cords out  of the way. Do not use floor polish or wax that makes floors slippery. If you must use wax, use non-skid floor wax. Do not have throw rugs and other things on the floor that can make you trip. What can I do with my stairs? Do not leave any items on the stairs. Make sure that there are handrails on both sides of the stairs and use them. Fix handrails that are broken or loose. Make sure that handrails are as long as the stairways. Check any carpeting to make sure that it is firmly attached to the stairs. Fix any carpet that is loose or worn. Avoid having throw rugs at the top or bottom of the stairs. If you do have throw rugs, attach them to the floor with carpet tape. Make sure that you have a light switch at the top of the stairs and the bottom of the stairs. If you do not have them, ask someone to add them for you. What else can I do to help prevent  falls? Wear shoes that: Do not have high heels. Have rubber bottoms. Are comfortable and fit you well. Are closed at the toe. Do not wear sandals. If you use a stepladder: Make sure that it is fully opened. Do not climb a closed stepladder. Make sure that both sides of the stepladder are locked into place. Ask someone to hold it for you, if possible. Clearly mark and make sure that you can see: Any grab bars or handrails. First and last steps. Where the edge of each step is. Use tools that help you move around (mobility aids) if they are needed. These include: Canes. Walkers. Scooters. Crutches. Turn on the lights when you go into a dark area. Replace any light bulbs as soon as they burn out. Set up your furniture so you have a clear path. Avoid moving your furniture around. If any of your floors are uneven, fix them. If there are any pets around you, be aware of where they are. Review your medicines with your doctor. Some medicines can make you feel dizzy. This can increase your chance of falling. Ask your doctor what other things that you can do to help prevent falls. This information is not intended to replace advice given to you by your health care provider. Make sure you discuss any questions you have with your health care provider. Document Released: 11/02/2008 Document Revised: 06/14/2015 Document Reviewed: 02/10/2014 Elsevier Interactive Patient Education  2017 [employer].

## 2021-12-02 NOTE — Progress Notes (Signed)
Virtual Visit via Telephone Note  I connected with  Kimberly [name] on 12/02/21 at  9:00 AM EST by telephone and verified that I am speaking with the correct person using two identifiers.  Location: Patient: HOME Provider: [facility] participating in the virtual visit: patient/Nurse Health Advisor   I discussed the limitations, risks, security and privacy concerns of performing an evaluation and management service by telephone and the availability of in person appointments. The patient expressed understanding and agreed to proceed.  Interactive audio and video telecommunications were attempted between this nurse and patient, however failed, due to patient having technical difficulties OR patient did not have access to video capability.  We continued and completed visit with audio only.  Some vital signs may be absent or patient reported.   Kimberly Flow, LPN  Subjective:   Kimberly [name] is a 76 y.o. female who presents for an Initial Medicare Annual Wellness Visit.  Review of Systems    Cardiac Risk Factors include: advanced age (>33mn, >>89women);dyslipidemia;hypertension;family history of premature cardiovascular disease;smoking/ tobacco exposure     Objective:    Today's Vitals   12/02/21 0909  Height: '5\' 1"'$  (1.549 m)  PainSc: 0-No pain   Body mass index is 21.16 kg/m.     12/02/2021    9:04 AM 05/12/2021    9:59 AM 12/08/2017   12:14 PM 03/17/2017    5:58 PM 03/17/2017   10:20 AM 12/17/2016    6:51 AM 06/14/2014   11:04 AM  Advanced Directives  Does Patient Have a Medical Advance Directive? No No No No No No No  Would patient like information on creating a medical advance directive? No - Patient declined  No - Patient declined No - Patient declined  No - Patient declined Yes - Educational materials given    Current Medications (verified) Outpatient Encounter Medications as of 12/02/2021  Medication Sig   albuterol (PROVENTIL) (2.5 MG/3ML)  0.083% nebulizer solution Take 3 mLs (2.5 mg total) by nebulization every 6 (six) hours as needed for wheezing or shortness of breath.   albuterol (VENTOLIN HFA) 108 (90 Base) MCG/ACT inhaler Inhale 2 puffs into the lungs every 6 (six) hours as needed for wheezing or shortness of breath.   ALPRAZolam (XANAX) 0.5 MG tablet 1 tab by mouth twice per day as needed   aspirin 81 MG tablet Take 1 tablet (81 mg total) by mouth daily.   bisoprolol (ZEBETA) 5 MG tablet Take 0.5 tablets (2.5 mg total) by mouth daily.   Budeson-Glycopyrrol-Formoterol (BREZTRI AEROSPHERE) 160-9-4.8 MCG/ACT AERO Inhale 2 puffs into the lungs 2 (two) times daily.   digoxin (LANOXIN) 0.125 MG tablet Take 0.5 tablets (62.5 mcg total) by mouth daily.   furosemide (LASIX) 20 MG tablet Take 1 tablet (20 mg total) by mouth daily as needed.   ibuprofen (ADVIL,MOTRIN) 200 MG tablet Take 200 mg by mouth daily as needed for headache.   nicotine (NICODERM CQ - DOSED IN MG/24 HR) 7 mg/24hr patch Place 1 patch (7 mg total) onto the skin daily.   rosuvastatin (CRESTOR) 10 MG tablet TAKE 1 TABLET BY MOUTH DAILY   sacubitril-valsartan (ENTRESTO) 24-26 MG Take 0.5 tablets by mouth 2 (two) times daily.   spironolactone (ALDACTONE) 25 MG tablet Take 12.5 mg by mouth at bedtime.   tiZANidine (ZANAFLEX) 2 MG tablet Take 1 tablet (2 mg total) by mouth every 6 (six) hours as needed for muscle spasms.   No facility-administered encounter medications on file as  of 12/02/2021.    Allergies (verified) Mupirocin and Codeine   History: Past Medical History:  Diagnosis Date   AICD (automatic cardioverter/defibrillator) present 03/17/2017   Anxiety    Atrial tachycardia    Bursitis of shoulder, right, adhesive    Cancer (HCC)    CHF (congestive heart failure) ([place])    Chronic bronchitis ([place])    "1-2 times/yr" (01/23/2014)   COPD (chronic obstructive pulmonary disease) (HCC)    CVD (cerebrovascular disease)    Dyslipidemia    Dysrhythmia     Frequency of urination    GERD (gastroesophageal reflux disease)    Heart murmur    History of stomach ulcers    HTN (hypertension) 02/22/2011   Migraines    "stopped many years ago" (06/14/2014)   Osteoporosis 08/19/2016   Pericarditis    Pneumonia "10 times" (06/14/2014)   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)    Silent myocardial infarction [facility]) "late 1990's"   Stress incontinence    "was suppose to have been tacked up years ago but I didn't do it"   Syncope, near    Associated with atrial tachycardia-event recorder 1/16   Thoracic outlet syndrome    VSD (ventricular septal defect)    Past Surgical History:  Procedure Laterality Date   ABDOMINAL AORTOGRAM W/LOWER EXTREMITY N/A 12/17/2016   Procedure: ABDOMINAL AORTOGRAM W/LOWER EXTREMITY;  Surgeon: Kimberly Hampshire, MD;  Location: [facility];  Service: Cardiovascular;  Laterality: N/A;   BIV ICD INSERTION CRT-D N/A 03/17/2017   Procedure: BIV ICD INSERTION CRT-D;  Surgeon: Kimberly Lance, MD;  Location: [facility];  Service: Cardiovascular;  Laterality: N/A;   CARDIAC CATHETERIZATION  "quite a few"   [place]  1970's   CORONARY ANGIOGRAM  2000   No significant CAD   ELECTROPHYSIOLOGIC STUDY N/A 06/14/2014   Procedure: A-Flutter/A-Tach/SVT Ablation;  Surgeon: Kimberly Lance, MD;  Location: [facility];  Service: Cardiovascular;  Laterality: N/A;   INSERTION OF ICD  03/17/2017   BIV   MULTIPLE TOOTH EXTRACTIONS     MYRINGOTOMY WITH TUBE PLACEMENT Right 2015   PERIPHERAL VASCULAR INTERVENTION  12/17/2016   Procedure: PERIPHERAL VASCULAR INTERVENTION;  Surgeon: Kimberly Hampshire, MD;  Location: [facility];  Service: Cardiovascular;;  Right common femoral PTA and Stent   RIGHT/LEFT HEART CATH AND CORONARY ANGIOGRAPHY Bilateral 11/06/2016   Procedure: RIGHT/LEFT HEART CATH AND CORONARY ANGIOGRAPHY;  Surgeon: Kimberly Hampshire,  MD;  Location: [facility];  Service: Cardiovascular;  Laterality: Bilateral;   SVT ABLATION  06/14/2014   TUBAL LIGATION  1972   VSD REPAIR  1958; 1967   Family History  Problem Relation Age of Onset   Cancer Mother    Cancer Father    Throat cancer Other    Hypertension Other    Stroke Other    Alcohol abuse Other    Arthritis Other    Cancer Other        lung cancer   Hypertension Other    Arthritis Other    Stroke Other    Breast cancer Neg Hx    Social History   Socioeconomic History   Marital status: Married    Spouse name: Not on file   Number of children: Not on file   Years of education: 12   Highest education level: Not on file  Occupational History   Occupation: retired  Tobacco Use  Smoking status: Every Day    Packs/day: 0.50    Years: 35.00    Total pack years: 17.50    Types: Cigarettes   Smokeless tobacco: Never   Tobacco comments:    3-4 cigarettes daily. Trying to quit. HS  Vaping Use   Vaping Use: Never used  Substance and Sexual Activity   Alcohol use: Not Currently    Alcohol/week: 1.0 standard drink of alcohol    Types: 1 Glasses of wine per week    Comment: 06/14/2014 "glass of wine maybe once/month"   Drug use: No   Sexual activity: Never  Other Topics Concern   Not on file  Social History Narrative   Pt lives with husband.   Social Determinants of Health   Financial Resource Strain: Low Risk  (12/02/2021)   Overall Financial Resource Strain (CARDIA)    Difficulty of Paying Living Expenses: Not hard at all  Food Insecurity: No Food Insecurity (12/02/2021)   Hunger Vital Sign    Worried About Running Out of Food in the Last Year: Never true    Ran Out of Food in the Last Year: Never true  Transportation Needs: No Transportation Needs (12/02/2021)   PRAPARE - Hydrologist (Medical): No    Lack of Transportation (Non-Medical): No  Physical Activity: Sufficiently Active (12/02/2021)   Exercise  Vital Sign    Days of Exercise per Week: 7 days    Minutes of Exercise per Session: 30 min  Stress: No Stress Concern Present (12/02/2021)   [place]    Feeling of Stress : Not at all  Social Connections: [place] (12/02/2021)   Social Connection and Isolation Panel [NHANES]    Frequency of Communication with Friends and Family: More than three times a week    Frequency of Social Gatherings with Friends and Family: More than three times a week    Attends Religious Services: More than 4 times per year    Active Member of [employer] or Organizations: Yes    Attends Music therapist: More than 4 times per year    Marital Status: Married    Tobacco Counseling Ready to quit: Not Answered Counseling given: Not Answered Tobacco comments: 3-4 cigarettes daily. Trying to quit. HS   Clinical Intake:  Pre-visit preparation completed: Yes  Pain : No/denies pain Pain Score: 0-No pain     BMI - recorded: 21.17 (10/31/2021) Nutritional Status: BMI of 19-24  Normal Nutritional Risks: None Diabetes: No  How often do you need to have someone help you when you read instructions, pamphlets, or other written materials from your doctor or pharmacy?: 1 - Never What is the last grade level you completed in school?: HSG  Diabetic? NO  Interpreter Needed?: No  Information entered by :: Lisette Abu, LPN.   Activities of Daily Living    12/02/2021    9:13 AM  In your present state of health, do you have any difficulty performing the following activities:  Hearing? 1  Comment right ear  Vision? 0  Difficulty concentrating or making decisions? 0  Walking or climbing stairs? 0  Dressing or bathing? 0  Doing errands, shopping? 0  Preparing Food and eating ? N  Using the Toilet? N  In the past six months, have you accidently leaked urine? Y  Comment mini pad  Do you have problems with loss of  bowel control? N  Managing your Medications? N  Managing your Finances? N  Housekeeping or managing your Housekeeping? N    Patient Care Team: Biagio Borg, MD as PCP - General (Internal Medicine) Kimberly Lance, MD as PCP - Electrophysiology (Cardiology) Davis Gourd Karen Chafe, MD (Unknown Physician Specialty) [place] Kimberly Norva Riffle, LCSW as [place] Management (Licensed Clinical Social Worker) [place], [place] as Set designer (Optometry)  Indicate any recent [place] you may have received from other than [facility] providers in the past year (date may be approximate).     Assessment:   This is a routine wellness examination for [facility].  Hearing/Vision screen Hearing Screening - Comments:: Right ear decreased hearing; no hearing aids. Vision Screening - Comments:: Wears rx glasses - up to date with routine eye exams with [facility].   Dietary issues and exercise activities discussed: Current Exercise Habits: Home exercise routine, Type of exercise: walking, Time (Minutes): 30, Frequency (Times/Week): 7, Weekly Exercise (Minutes/Week): 210, Intensity: Moderate, Exercise limited by: respiratory conditions(s)   Goals Addressed   None   Depression Screen    12/02/2021    9:06 AM 08/27/2021   10:14 AM 05/23/2021   11:00 AM 05/14/2021    1:18 PM 04/16/2021    2:05 PM 04/16/2021    1:20 PM 06/11/2020    3:40 PM  PHQ 2/9 Scores  PHQ - 2 Score '2 3 3 5 1 4 1  '$ PHQ- 9 Score '4 9 9 11  11     '$ Fall Risk    12/02/2021    9:04 AM 05/23/2021   10:59 AM 05/14/2021    1:18 PM 04/16/2021    2:05 PM 04/16/2021    1:20 PM  [place] in the past year? 0 0 0  0  Number falls in past yr: 0 0 0 0 0  Injury with Fall? 0 0 0 0 0  Risk for fall due to : No Fall Risks      Follow up Falls prevention discussed        FALL RISK PREVENTION PERTAINING TO THE HOME:  Any stairs in or around the home? No  If so, are there any without handrails? No  Home free  of loose throw rugs in walkways, pet beds, electrical cords, etc? Yes  Adequate lighting in your home to reduce risk of falls? Yes   ASSISTIVE DEVICES UTILIZED TO PREVENT FALLS:  Life alert? No  Use of a cane, walker or w/c? No  Grab bars in the bathroom? Yes  Shower chair or bench in shower? No  Elevated toilet seat or a handicapped toilet? No   TIMED UP AND GO:  Was the test performed? No . PHONE VISIT  Cognitive Function:        12/02/2021    9:07 AM  6CIT Screen  What Year? 0 points  What month? 0 points  What time? 0 points  Count back from 20 0 points  Months in reverse 0 points  Repeat phrase 0 points  Total Score 0 points    Immunizations Immunization History  Administered Date(s) Administered   Influenza Split 10/15/2020   Influenza, High Dose Seasonal PF 09/30/2018   Influenza,inj,Quad PF,6+ Mos 10/17/2015   Influenza,inj,quad, With Preservative 11/17/2016   Influenza-Unspecified 09/20/2013, 10/13/2017, 09/30/2018, 10/13/2019, 10/13/2019   PFIZER(Purple Top)SARS-COV-2 Vaccination 03/13/2019, 04/05/2019   Pneumococcal Conjugate-13 02/20/2010, 11/23/2012   Pneumococcal Polysaccharide-23 10/27/2017   Tdap 02/20/2009    TDAP status: Due, Education has been provided regarding the importance of  this vaccine. Advised may receive this vaccine at local pharmacy or Health Dept. Aware to provide a copy of the vaccination record if obtained from local pharmacy or Health Dept. Verbalized acceptance and understanding.  Flu Vaccine status: Up to date  Pneumococcal vaccine status: Up to date  Covid-19 vaccine status: Completed vaccines  Qualifies for Shingles Vaccine? Yes   Zostavax completed No   Shingrix Completed?: No.    Education has been provided regarding the importance of this vaccine. Patient has been advised to call insurance company to determine out of pocket expense if they have not yet received this vaccine. Advised may also receive vaccine at local  pharmacy or Health Dept. Verbalized acceptance and understanding.  Screening Tests Health Maintenance  Topic Date Due   COLONOSCOPY (Pts 45-16yr Insurance coverage will need to be confirmed)  Never done   Zoster Vaccines- Shingrix (1 of 2) Never done   MAMMOGRAM  02/22/2017   TETANUS/TDAP  02/21/2019   COVID-19 Vaccine (3 - Pfizer series) 05/31/2019   Medicare Annual Wellness (AWV)  12/03/2022   Pneumonia Vaccine 76 Years old  Completed   DEXA SCAN  Completed   Hepatitis C Screening  Completed   HPV VACCINES  Aged Out   INFLUENZA VACCINE  Discontinued    Health Maintenance  Health Maintenance Due  Topic Date Due   COLONOSCOPY (Pts 45-468yrInsurance coverage will need to be confirmed)  Never done   Zoster Vaccines- Shingrix (1 of 2) Never done   MAMMOGRAM  02/22/2017   TETANUS/TDAP  02/21/2019   COVID-19 Vaccine (3 - Pfizer series) 05/31/2019    Colorectal cancer screening: Type of screening: FOBT/FIT. Completed 10/20/2018. Repeat every 1 years (PATIENT DECLINED TESTING AT THIS TIME)  Mammogram status: Completed 02/23/2015. Repeat every year (PATIENT DECLINED TESTING AT THIS TIME)  Bone Density status: Completed 08/18/2016. Results reflect: Bone density results: OSTEOPOROSIS. Repeat every 2 years. (PATIENT DECLINED TESTING AT THIS TIME)  Lung Cancer Screening: (Low Dose CT Chest recommended if Age 74-80ears, 30 pack-year currently smoking OR have quit w/in 15years.) does qualify.   Lung Cancer Screening Referral: NO (PATIENT DECLINED TESTING AT THIS TIME)  Additional Screening:  Hepatitis C Screening: does qualify; Completed 07/24/2016  Vision Screening: Recommended annual ophthalmology exams for early detection of glaucoma and other disorders of the eye. Is the patient up to date with their annual eye exam?  Yes  Who is the provider or what is the name of the office in which the patient attends annual eye exams? [facility] If pt is not established with a provider,  would they like to be referred to a provider to establish care? No .   Dental Screening: Recommended annual dental exams for proper oral hygiene  Community Resource Referral / Chronic Care Management: CRR required this visit?  No   CCM required this visit?  No      Plan:     I have personally reviewed and noted the following in the patient's chart:   Medical and social history Use of alcohol, tobacco or illicit drugs  Current medications and supplements including opioid prescriptions. Patient is not currently taking opioid prescriptions. Functional ability and status Nutritional status Physical activity Advanced directives List of other physicians Hospitalizations, surgeries, and ER visits in previous 12 months Vitals Screenings to include cognitive, depression, and falls Referrals and appointments  In addition, I have reviewed and discussed with patient certain preventive protocols, quality metrics, and best practice recommendations. A written personalized care plan for  preventive services as well as general preventive health recommendations were provided to patient.     Kimberly Flow, LPN   06/13/9100   Nurse Notes: N/A

## 2021-12-04 ENCOUNTER — Encounter (HOSPITAL_COMMUNITY): Payer: Self-pay | Admitting: Cardiology

## 2021-12-04 ENCOUNTER — Ambulatory Visit (HOSPITAL_COMMUNITY)
Admission: RE | Admit: 2021-12-04 | Discharge: 2021-12-04 | Disposition: A | Discharge status: Home / Self Care | Payer: Medicare Other | Source: Ambulatory Visit | Attending: Cardiology | Admitting: Cardiology

## 2021-12-04 ENCOUNTER — Ambulatory Visit (HOSPITAL_BASED_OUTPATIENT_CLINIC_OR_DEPARTMENT_OTHER)
Admission: RE | Admit: 2021-12-04 | Discharge: 2021-12-04 | Disposition: A | Discharge status: Home / Self Care | Payer: Medicare Other | Source: Ambulatory Visit | Attending: Cardiology | Admitting: Cardiology

## 2021-12-04 VITALS — BP 104/60 | HR 63 | Wt 110.0 lb

## 2021-12-04 DIAGNOSIS — I110 Hypertensive heart disease with heart failure: Secondary | ICD-10-CM | POA: Insufficient documentation

## 2021-12-04 DIAGNOSIS — E785 Hyperlipidemia, unspecified: Secondary | ICD-10-CM | POA: Diagnosis not present

## 2021-12-04 DIAGNOSIS — R0789 Other chest pain: Secondary | ICD-10-CM | POA: Insufficient documentation

## 2021-12-04 DIAGNOSIS — Z79899 Other long term (current) drug therapy: Secondary | ICD-10-CM | POA: Diagnosis not present

## 2021-12-04 DIAGNOSIS — I739 Peripheral vascular disease, unspecified: Secondary | ICD-10-CM | POA: Diagnosis not present

## 2021-12-04 DIAGNOSIS — I5022 Chronic systolic (congestive) heart failure: Secondary | ICD-10-CM

## 2021-12-04 DIAGNOSIS — I428 Other cardiomyopathies: Secondary | ICD-10-CM | POA: Diagnosis not present

## 2021-12-04 DIAGNOSIS — I2510 Atherosclerotic heart disease of native coronary artery without angina pectoris: Secondary | ICD-10-CM | POA: Insufficient documentation

## 2021-12-04 DIAGNOSIS — J449 Chronic obstructive pulmonary disease, unspecified: Secondary | ICD-10-CM | POA: Diagnosis not present

## 2021-12-04 DIAGNOSIS — F17200 Nicotine dependence, unspecified, uncomplicated: Secondary | ICD-10-CM | POA: Insufficient documentation

## 2021-12-04 DIAGNOSIS — Z7982 Long term (current) use of aspirin: Secondary | ICD-10-CM | POA: Diagnosis not present

## 2021-12-04 LAB — BASIC METABOLIC PANEL
Anion gap: 9 (ref 5–15)
BUN: 16 mg/dL (ref 8–23)
CO2: 27 mmol/L (ref 22–32)
Calcium: 9 mg/dL (ref 8.9–10.3)
Chloride: 103 mmol/L (ref 98–111)
Creatinine, Ser: 0.99 mg/dL (ref 0.44–1.00)
GFR, Estimated: 60 mL/min — ABNORMAL LOW (ref >60)
Glucose, Bld: 94 mg/dL (ref 70–99)
Potassium: 4.3 mmol/L (ref 3.5–5.1)
Sodium: 139 mmol/L (ref 135–145)

## 2021-12-04 LAB — ECHOCARDIOGRAM COMPLETE
Area-P 1/2: 2.66 cm2
Calc EF: 42.2 %
S' Lateral: 4.9 cm
Single Plane A2C EF: 43.9 %
Single Plane A4C EF: 41 %

## 2021-12-04 LAB — BRAIN NATRIURETIC PEPTIDE: B Natriuretic Peptide: 315.9 pg/mL — ABNORMAL HIGH (ref 0.0–100.0)

## 2021-12-04 LAB — DIGOXIN LEVEL: Digoxin Level: 0.6 ng/mL — ABNORMAL LOW (ref 0.8–2.0)

## 2021-12-04 NOTE — Progress Notes (Signed)
PCP: Dr. Jenny Reichmann Cardiology: Dr. Harrington Challenger HF Cardiology: Dr. Aundra Dubin Cardiology/Vascular: Dr Fletcher Anon  76 y.o.with history of VSD repair as a child, COPD/active smoking, PAD, and chronic systolic CHF.  Patient has a long history of cardiomyopathy.  She had VSD repaired as a child and imaging has not showed residual VSD.  Cardiac MRI in 11/09 showed EF 36%.  Echo in 12/15 showed EF 40-45%.  Echo in 3/18 showed EF down to 15-20% with moderate RV systolic dysfunction.  RHC/LHC in 10/18 showed nonobstructive coronary but was concerning for low output (CI 1.33).  She had a nonhealing right lower extremity wound.  Peripheral angiogram with bilateral CFA occlusions and underwent DCB/self expanding stent to to right CFA.    CPX in 1/19 showed moderate to severe functional limitation, combination of HF and lung disease, probably more related to the lung disease.  PFTs in 12/18 showed severe COPD.   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    7/19 peripheral arterial dopplers showed significant in-stent restenosis right CFA stent.  Echo in 4/21 showed EF 35-40% with mild AI.  CPX in 5/21 showed severe COPD, mild-moderate HF limitation.  11/21 Cardiolite showed prior septal and apical infarction, no ischemia.   Had COVID April 2022. She has continued to recover.   Echo in 9/22 showed EF 35-40% with basal-mid septal akinesis and anterior hypokinesis, no evidence for residual VSD, mildly decreased RV systolic function.  Echo was done today and reviewed, EF 40% with basal to mid anteroseptal and inferoseptal akinesis, mild RV dysfunction, PASP 33 mmHg.   She returns for followup of CHF. She is still smoking.  Says she cannot stop now due to stress.  She has been caring for her husband who has had a number of complications post-back surgery.  She reports fatigue and poor energy. 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  This is chronic.  She has occasional atypical chest pain, no  change to pattern.  Weight is stable.  She had a left leg ulcer after a skin cancer removal, this is getting better.    Labs (12/22): BNP 418, digoxin 0.5, K 5, creatinine 1.17 Labs (8/23): BNP 222, digoxin 0.7, LDL 68, K 4.4, creatinine 0.91 Labs (10/23): K 3.7, creatinine 0.96  [name] CRT-D device interrogation: Heartlogic 0, no AF/VT  PMH: 1. VSD: s/p repair at [facility] as child.  From description, sounds like muscular VSD.  2. Atrial tachycardia s/p ablation.  3. Hyperlipidemia 4. COPD: Active smoker.  - PFTs (12/18): Severe obstructive lung disease.  5. Atrial fibrillation: Paroxysmal.   Noted only transiently.   6. PAD:  - Angiogram 11/18 showed totally occluded bilateral CFAs.  Patient had stent to right CFA.  ABIs in 12/18 were normal on right. - ABIs (10/20): ABI 0.8 right, 0.74 left - 12/21 dopplers with R CFA stent in-stenosis.  7. Chronic systolic CHF: Nonischemic cardiomyopathy.   - cMRI (11/09): EF 36%, VSD patch in anterior ventricular septum - Echo (12/15): EF 40-45% - Echo (3/18): EF 15-20%, moderate LV dilation, moderate MR, moderate RV dilation with moderately decreased systolic function.  - LHC/RHC (10/18): 3+ MR, EF < 25%, minimal nonobstructive CAD; PA 41/21, LVEDP 18, CI 1.33.  - CPX (1/19): peak VO2 13.4, VE/VCO2 slope 43, RER 1.07.  Mod-severe functional limitation due to combination of HF and lung disease, probably more related to lung disease.  - [name] CRT-D device.  - Echo (7/19): EF 25-30%, mild LV dilation,  mild MR, normal RV size and systolic function.  - Echo (4/21): EF 35-40%, mild AI - CPX (5/21): severe COPD, mild-moderate HF limitation.  - Cardiolite (11/21): EF 47%, prior septal and apical infarction, no ischemia. - Echo (9/22): EF 35-40% with basal-mid septal akinesis and anterior hypokinesis, no evidence for residual VSD, mildly decreased RV systolic function.  - Echo (11/23): EF 40% with basal to mid anteroseptal and  inferoseptal akinesis, mild RV dysfunction, PASP 33 mmHg.  8. PVCs: Zio patch 9/19 with 6% PVCs.  9. COVID-19 (4/22)  SH: Smoker, lives in [place], married.  FH: Mother with PAD, sister died at birth from congenital heart disease.   ROS: All systems reviewed and negative except as per HPI.   Current Outpatient Medications  Medication Sig Dispense Refill   albuterol (PROVENTIL) (2.5 MG/3ML) 0.083% nebulizer solution Take 3 mLs (2.5 mg total) by nebulization every 6 (six) hours as needed for wheezing or shortness of breath. 75 mL 5   albuterol (VENTOLIN HFA) 108 (90 Base) MCG/ACT inhaler Inhale 2 puffs into the lungs every 6 (six) hours as needed for wheezing or shortness of breath. 8.5 g 3   ALPRAZolam (XANAX) 0.5 MG tablet 1 tab by mouth twice per day as needed 60 tablet 2   aspirin 81 MG tablet Take 1 tablet (81 mg total) by mouth daily. 100 tablet 99   bisoprolol (ZEBETA) 5 MG tablet Take 0.5 tablets (2.5 mg total) by mouth daily. 45 tablet 3   Budeson-Glycopyrrol-Formoterol (BREZTRI AEROSPHERE) 160-9-4.8 MCG/ACT AERO Inhale 2 puffs into the lungs 2 (two) times daily. 32.1 g 3   digoxin (LANOXIN) 0.125 MG tablet Take 0.5 tablets (62.5 mcg total) by mouth daily. 45 tablet 3   furosemide (LASIX) 20 MG tablet Take 1 tablet (20 mg total) by mouth daily as needed. 90 tablet 1   ibuprofen (ADVIL,MOTRIN) 200 MG tablet Take 200 mg by mouth daily as needed for headache.     rosuvastatin (CRESTOR) 10 MG tablet TAKE 1 TABLET BY MOUTH DAILY 90 tablet 3   sacubitril-valsartan (ENTRESTO) 24-26 MG Take 0.5 tablets by mouth 2 (two) times daily.     spironolactone (ALDACTONE) 25 MG tablet Take 12.5 mg by mouth at bedtime.     tiZANidine (ZANAFLEX) 2 MG tablet Take 1 tablet (2 mg total) by mouth every 6 (six) hours as needed for muscle spasms. 40 tablet 1   No current facility-administered medications for this encounter.   BP 104/60   Pulse 63   Wt 49.9 kg (110 lb)   SpO2 99%   BMI 20.78 kg/m    Wt Readings from Last 3 Encounters:  12/04/21 49.9 kg (110 lb)  10/31/21 50.8 kg (112 lb)  09/16/21 50 kg (110 lb 3.2 oz)   General: NAD Neck: No JVD, no thyromegaly or thyroid nodule.  Lungs: Distant BS CV: Nondisplaced PMI.  Heart regular S1/S2, no S3/S4, no murmur.  No peripheral edema.  No carotid bruit.  Unable to palpate pedal pulses.  Abdomen: Soft, nontender, no hepatosplenomegaly, no distention.  Skin: Intact without lesions or rashes.  Neurologic: Alert and oriented x 3.  Psych: Normal affect. Extremities: No clubbing or cyanosis.  HEENT: Normal.   Assessment/Plan: 1.  PAD: She had endovascular intervention to occluded right CFA in 11/18, but subsequent arterial showed in-stent restenosis. She has occluded left CFA also that was not intervened on. Minimal claudication but she had a slow-healing wound on her left lower leg. She is still smoking. - I will  arrange for peripheral arterial doppler evaluation.  - Continue statin.  - Discussed smoking cessation, she is under a lot stress with husband's illness and not ready to quit.  - Followed by Dr Fletcher Anon.   2. CAD: Nonobstructive on 10/18 cath. She has atypical chest pain at times with stress.  Symptoms are very brief and do not appear to be consistent with angina.  Cardiolite in 11/21 with no evidence of ischemia.  - She is on ASA 81 and statin.  3. Hyperlipidemia: Continue crestor, good lipids in 8/23.  4. COPD: Severe by PFTs on 5/21 CPX. COPD plays a significant role in her dyspnea. She is still smoking, not ready to quit (see above).   5. Chronic systolic CHF: Nonischemic cardiomyopathy.  RHC/LHC in 10/18 with no nonobstructive coronary disease but CI was very low at 1.33.  Her low cardiac output was out of proportion to her symptoms.  [name] CRT-D device.  CPX in 5/21 showed severe COPD but only mild-moderate HF limitation.   Echo with EF stable at 35-40% in 9/22 and EF 40% on today's echo.  Weight stable, not volume  overloaded on exam or by Heartlogic. NYHA class III symptoms likely more to do with COPD than CHF.     - Takes Lasix prn.   - Increase Entresto to 24/26 bid, she has been cutting the tabs in half.  BMET/BNP today and BMET in 10 days.  - Continue bisoprolol 2.5 mg daily.  - Continue spironolactone 12.5 mg daily. Unable to tolerate higher doses.Take qhs.  - Continue digoxin, check level today.  - Unable to tolerate [place].   Followup 3 months with APP  Loralie Champagne 12/04/2021

## 2021-12-04 NOTE — Patient Instructions (Signed)
INCREASE Entresto back to 1 tablet Twice daily  Labs done today, your results will be available in MyChart, we will contact you for abnormal readings.  Repeat blood work in 10 days at [place] provider has ordered Dopplers for you at [facility]. You will be called to have this test arranged.  Your physician recommends that you schedule a follow-up appointment in: 3 months  If you have any questions or concerns before your next appointment please send Kimberly [name] a message through [place] or call our office at [phone].    TO LEAVE A MESSAGE FOR THE NURSE SELECT OPTION 2, PLEASE LEAVE A MESSAGE INCLUDING: YOUR NAME DATE OF BIRTH CALL BACK NUMBER REASON FOR CALL**this is important as we prioritize the call backs  YOU WILL RECEIVE A CALL BACK THE SAME DAY AS LONG AS YOU CALL BEFORE 4:00 PM  At the [facility], you and your health needs are our priority. As part of our continuing mission to provide you with exceptional heart care, we have created designated Provider Care Teams. These Care Teams include your primary Cardiologist (physician) and Advanced Practice Providers (APPs- Physician Assistants and Nurse Practitioners) who all work together to provide you with the care you need, when you need it.   You may see any of the following providers on your designated Care Team at your next follow up: Dr Glori Bickers Dr Loralie Champagne Dr. Roxana Hires, NP Lyda Jester, [place] [facility] [place], [place] Forestine Na, NP Audry Riles, PharmD   Please be sure to bring in all your medications bottles to every appointment.

## 2021-12-13 ENCOUNTER — Emergency Department
Admission: EM | Admit: 2021-12-13 | Discharge: 2021-12-13 | Disposition: A | Discharge status: Home / Self Care | Payer: Medicare Other

## 2021-12-14 DIAGNOSIS — L03116 Cellulitis of left lower limb: Secondary | ICD-10-CM | POA: Diagnosis not present

## 2021-12-16 ENCOUNTER — Ambulatory Visit (INDEPENDENT_AMBULATORY_CARE_PROVIDER_SITE_OTHER): Payer: Medicare Other

## 2021-12-16 DIAGNOSIS — I5022 Chronic systolic (congestive) heart failure: Secondary | ICD-10-CM | POA: Diagnosis not present

## 2021-12-17 ENCOUNTER — Ambulatory Visit: Payer: Medicare Other | Admitting: Nurse Practitioner

## 2021-12-17 LAB — CUP PACEART REMOTE DEVICE CHECK
Battery Remaining Longevity: 78 mo
Battery Remaining Percentage: 88 %
Brady Statistic RA Percent Paced: 43 %
Brady Statistic RV Percent Paced: 88 %
Date Time Interrogation Session: 20231127041000
HighPow Impedance: 78 Ohm
Implantable Lead Connection Status: 753985
Implantable Lead Connection Status: 753985
Implantable Lead Connection Status: 753985
Implantable Lead Implant Date: 20190226
Implantable Lead Implant Date: 20190226
Implantable Lead Implant Date: 20190226
Implantable Lead Location: 753858
Implantable Lead Location: 753859
Implantable Lead Location: 753860
Implantable Lead Model: 292
Implantable Lead Model: 4671
Implantable Lead Model: 7740
Implantable Lead Serial Number: 444536
Implantable Lead Serial Number: 726363
Implantable Lead Serial Number: 805938
Implantable Pulse Generator Implant Date: 20190226
Lead Channel Impedance Value: 468 Ohm
Lead Channel Impedance Value: 544 Ohm
Lead Channel Impedance Value: 842 Ohm
Lead Channel Setting Pacing Amplitude: 2 V
Lead Channel Setting Pacing Amplitude: 2.5 V
Lead Channel Setting Pacing Amplitude: 2.6 V
Lead Channel Setting Pacing Pulse Width: 0.4 ms
Lead Channel Setting Pacing Pulse Width: 0.4 ms
Lead Channel Setting Sensing Sensitivity: 0.5 mV
Lead Channel Setting Sensing Sensitivity: 1 mV
Pulse Gen Serial Number: 204176
Zone Setting Status: 755011

## 2021-12-18 ENCOUNTER — Other Ambulatory Visit: Payer: Self-pay | Admitting: Cardiology

## 2021-12-18 DIAGNOSIS — I739 Peripheral vascular disease, unspecified: Secondary | ICD-10-CM

## 2021-12-23 ENCOUNTER — Encounter: Payer: Self-pay | Admitting: Dermatology

## 2021-12-23 ENCOUNTER — Ambulatory Visit: Payer: Medicare Other | Admitting: Dermatology

## 2021-12-23 VITALS — BP 91/48 | HR 61

## 2021-12-23 DIAGNOSIS — I872 Venous insufficiency (chronic) (peripheral): Secondary | ICD-10-CM | POA: Diagnosis not present

## 2021-12-23 DIAGNOSIS — Z85828 Personal history of other malignant neoplasm of skin: Secondary | ICD-10-CM | POA: Diagnosis not present

## 2021-12-23 MED ORDER — TRIAMCINOLONE ACETONIDE 0.1 % EX CREA: 1.0000 | TOPICAL_CREAM | Freq: Two times a day (BID) | CUTANEOUS | 1 refills | Status: DC

## 2021-12-23 NOTE — Progress Notes (Signed)
   New Patient Visit  Subjective  Kimberly [name] is a 76 y.o. female who presents for the following: Cellulitis (L lower leg, Doxycycline '100mg'$  bid on 2nd week, pt recently had surgery for [facility] of the L lower leg at the [facility], painful, itchy).  Patient accompanied by husband  The following portions of the chart were reviewed this encounter and updated as appropriate:       Review of Systems:  No other skin or systemic complaints except as noted in HPI or Assessment and Plan.  Objective  Well appearing patient in no apparent distress; mood and affect are within normal limits.  A focused examination was performed including left lower leg. Relevant physical exam findings are noted in the Assessment and Plan.  Left Lower Leg - Anterior 2+ pitting edema, diffuse erythema           Assessment & Plan  Venous stasis dermatitis of left lower extremity Left Lower Leg - Anterior  Vs Cellulitis  Finish Doxycycline '100mg'$  1 po bid for a total of 2 wks Start TMC 0.1% cr bid aa left lower leg Recommend pt starting her Furosemide '20mg'$  1 po qd as directed Start Compression socks qAM  Stasis in the legs causes chronic leg swelling, which may result in itchy or painful rashes, skin discoloration, skin texture changes, and sometimes ulceration.  Recommend daily graduated compression hose/stockings- easiest to put on first thing in morning, remove at bedtime.  Elevate legs as much as possible. Avoid salt/sodium rich foods.   triamcinolone cream (KENALOG) 0.1 % - Left Lower Leg - Anterior Apply 1 Application topically 2 (two) times daily. Qd to bid aa left lower leg until clear, avoid face, groin, axilla  History of Basal Cell Carcinoma of the Skin - No evidence of recurrence today L lower leg - Recommend regular full body skin exams - Recommend daily broad spectrum sunscreen SPF 30+ to sun-exposed areas, reapply every 2 hours as needed.  - Call if any new or changing lesions  are noted between office visits   Return in about 1 week (around 12/30/2021) for recheck leg Stasis derm vs Cellulitis.  I, Othelia Pulling, RMA, am acting as scribe for Brendolyn Patty, MD .  Documentation: I have reviewed the above documentation for accuracy and completeness, and I agree with the above.  Brendolyn Patty MD

## 2021-12-23 NOTE — Patient Instructions (Addendum)
Finish Doxycycline '100mg'$  1 pill twice a day for a total of 2 wks Start Triamcinolone 0.1% cream twice a day aa left lower leg, avoid using on face, in groin, or under arms Recommend starting Furosemide '20mg'$  1 pill a day as directed Start Compression socks daily   Stasis Dermatitis Stasis dermatitis is a long-term (chronic) skin condition that happens when veins can no longer pump blood back to the heart (poor circulation). This condition causes a red or brown scaly rash or sores (ulcers) from the pooling of blood (stasis). This condition usually affects the lower legs. It may affect one leg or both legs. Without treatment, severe stasis dermatitis can lead to other skin conditions and infections. What are the causes? This condition is caused by poor circulation. What increases the risk? You are more likely to develop this condition if: You are not very active. You stand for long periods of time. You have veins that have become enlarged and twisted (varicose veins). You have leg veins that are not strong enough to send blood back to the heart (venous insufficiency). You have had a blood clot. You have been pregnant many times. You have had vein surgery. You are obese. You have heart or kidney failure. You are 81 years of age or older. You have had injuries to your legs in the past. What are the signs or symptoms? Common early symptoms of this condition include: Itchiness in one or both of your legs. Swelling in your ankle or leg. This might get better overnight but be worse again during the day. Skin that looks thin on your ankle and leg. Red or brown marks that develop slowly. Skin that is dry, cracked, or easily irritated. Red, swollen skin that is sore or has a burning feeling. An achy or heavy feeling after you walk or stand for long periods of time. Pain. Later and more severe symptoms of this condition include: Skin that looks shiny. Small, open sores (ulcers). These are  often red or purple and leak fluid. Skin that feels hard. Severe itching. A change in the shape or color of your lower legs. Severe pain. Difficulty walking. How is this diagnosed? This condition may be diagnosed based on: Your symptoms and medical history. A physical exam. You may also have tests, including: Blood tests. Imaging tests to check blood flow (Doppler ultrasound). Allergy tests. You may need to see a health care provider who specializes in skin diseases (dermatologist). How is this treated? This condition may be treated with: Compression stockings or an elastic wrap to improve circulation. Medicines, such as: Corticosteroid creams and ointments. Non-corticosteroid medicines applied to the skin (topical). Medicine to reduce swelling in the legs (diuretics). Antibiotics. Medicine to relieve itching (antihistamines). A bandage (dressing). A wrap that contains zinc and gelatin (Unna boot). Follow these instructions at home: Skin care Moisturize your skin as told by your health care provider. Do not use moisturizers with fragrance. This can irritate your skin. Apply a cool, wet cloth (cool compress) to the affected areas. Do not scratch your skin. Do not rub your skin dry after a bath or shower. Gently pat your skin dry. Do not use scented soaps, detergents, or perfumes. Medicines Take or use over-the-counter and prescription medicines only as told by your health care provider. If you were prescribed an antibiotic medicine, take or use it as told by your health care provider. Do not stop taking or using the antibiotic even if your condition improves. Activity Walk as told by your  health care provider. Walking increases blood flow. Do calf and ankle exercises throughout the day as told by your health care provider. This will help increase blood flow. Raise (elevate) your legs above the level of your heart when you are sitting or lying down. Lifestyle Work with your  health care provider to lose weight, if needed. Do not cross your legs when you sit. Do not stand or sit in one position for long periods of time. Wear comfortable, loose-fitting clothing. Circulation in your legs will be worse if you wear tight pants, belts, and waistbands. Do not use any products that contain nicotine or tobacco, such as cigarettes, e-cigarettes, and chewing tobacco. If you need help quitting, ask your health care provider. General instructions If you were asked to use one of the following to help with your condition, follow instructions from your health care provider on how to: Remove and change any dressing. Wear compression stockings. These stockings help to prevent blood clots and reduce swelling in your legs. Wear the [employer]. Keep all follow-up visits as told by your health care provider. This is important. Contact a health care provider if: Your condition does not improve with treatment. Your condition gets worse. You have signs of infection in the affected area. Watch for: Swelling. Tenderness. Redness. Soreness. Warmth. You have a fever. Get help right away if: You notice red streaks coming from the affected area. Your bone or joint underneath the affected area becomes painful after the skin has healed. The affected area turns darker. You feel a deep pain in your leg or groin. You are short of breath. Summary Stasis dermatitis is a long-term (chronic) skin condition that happens when veins can no longer pump blood back to the heart (poor circulation). Wear compression stockings as told by your health care provider. These stockings help to prevent blood clots and reduce swelling in your legs. Follow instructions from your health care provider about activity, medicines, and lifestyle. Contact a health care provider if you have a fever or have signs of infection in the affected area. Keep all follow-up visits as told by your health care provider. This is  important. This information is not intended to replace advice given to you by your health care provider. Make sure you discuss any questions you have with your health care provider. Document Revised: 03/19/2020 Document Reviewed: 03/19/2020 Elsevier Patient Education  [place].    Due to recent changes in healthcare laws, you may see results of your pathology and/or laboratory studies on MyChart before the doctors have had a chance to review them. We understand that in some cases there may be results that are confusing or concerning to you. Please understand that not all results are received at the same time and often the doctors may need to interpret multiple results in order to provide you with the best plan of care or course of treatment. Therefore, we ask that you please give [place] 2 business days to thoroughly review all your results before contacting the office for clarification. Should we see a critical lab result, you will be contacted sooner.   If You Need Anything After Your Visit  If you have any questions or concerns for your doctor, please call our main line at [phone] and press option 4 to reach your doctor's medical assistant. If no one answers, please leave a voicemail as directed and we will return your call as soon as possible. Messages left after 4 pm will be answered the following  business day.   You may also send [place] a message via [place]. We typically respond to MyChart messages within 1-2 business days.  For prescription refills, please ask your pharmacy to contact our office. Our fax number is [phone].  If you have an urgent issue when the clinic is closed that cannot wait until the next business day, you can page your doctor at the number below.    Please note that while we do our best to be available for urgent issues outside of office hours, we are not available 24/7.   If you have an urgent issue and are unable to reach [place], you may choose to seek  medical care at your doctor's office, retail clinic, urgent care center, or emergency room.  If you have a medical emergency, please immediately call 911 or go to the emergency department.  Pager Numbers  - Dr. Nehemiah Massed: [phone]  - Dr. Laurence Ferrari: [phone]  - Dr. Nicole Kindred: [phone]  In the event of inclement weather, please call our main line at [phone] for an update on the status of any delays or closures.  Dermatology Medication Tips: Please keep the boxes that topical medications come in in order to help keep track of the instructions about where and how to use these. Pharmacies typically print the medication instructions only on the boxes and not directly on the medication tubes.   If your medication is too expensive, please contact our office at [phone] option 4 or send [place] a message through [place].   We are unable to tell what your co-pay for medications will be in advance as this is different depending on your insurance coverage. However, we may be able to find a substitute medication at lower cost or fill out paperwork to get insurance to cover a needed medication.   If a prior authorization is required to get your medication covered by your insurance company, please allow [place] 1-2 business days to complete this process.  Drug prices often vary depending on where the prescription is filled and some pharmacies may offer cheaper prices.  The website www.goodrx.com contains coupons for medications through different pharmacies. The prices here do not account for what the cost may be with help from insurance (it may be cheaper with your insurance), but the website can give you the price if you did not use any insurance.  - You can print the associated coupon and take it with your prescription to the pharmacy.  - You may also stop by our office during regular business hours and pick up a GoodRx coupon card.  - If you need your prescription sent electronically to a  different pharmacy, notify our office through [facility] or by phone at [phone] option 4.     Si Usted Necesita Algo Despus de Su Visita  Tambin puede enviarnos un mensaje a travs de Pharmacist, community. Por lo general respondemos a los mensajes de MyChart en el transcurso de 1 a 2 das hbiles.  Para renovar recetas, por favor pida a su farmacia que se ponga en contacto con nuestra oficina. Harland Dingwall de fax es [place] [phone].  Si tiene un asunto urgente cuando la clnica est cerrada y que no puede esperar hasta el siguiente da hbil, puede llamar/localizar a su doctor(a) al nmero que aparece a continuacin.   Por favor, tenga en cuenta que aunque hacemos todo lo posible para estar disponibles para asuntos urgentes fuera del horario de [place], no estamos disponibles las 24 horas del da, los 7 das  de la semana.   Si tiene un problema urgente y no puede comunicarse con nosotros, puede optar por buscar atencin mdica  en el consultorio de su doctor(a), en una clnica privada, en un centro de atencin urgente o en una sala de emergencias.  Si tiene Engineering geologist, por favor llame inmediatamente al 911 o vaya a la sala de emergencias.  Nmeros de bper  - Dr. Nehemiah Massed: [phone]  - Dra. Moye: [phone]  - Dra. Nicole Kindred: [phone]  En caso de inclemencias del [place], por favor llame a Johnsie Kindred principal al [phone] para una actualizacin sobre el [place] de cualquier retraso o cierre.  Consejos para la medicacin en dermatologa: Por favor, guarde las cajas en las que vienen los medicamentos de uso tpico para ayudarle a seguir las instrucciones sobre dnde y cmo usarlos. Las farmacias generalmente imprimen las instrucciones del medicamento slo en las cajas y no directamente en los tubos del [place].   Si su medicamento es muy caro, por favor, pngase en contacto con Zigmund Daniel llamando al [phone] y presione la opcin 4 o envenos un  mensaje a travs de Pharmacist, community.   No podemos decirle cul ser su copago por los medicamentos por adelantado ya que esto es diferente dependiendo de la cobertura de su seguro. Sin embargo, es posible que podamos encontrar un medicamento sustituto a Electrical engineer un formulario para que el seguro cubra el medicamento que se considera necesario.   Si se requiere una autorizacin previa para que su compaa de seguros [place] su medicamento, por favor permtanos de 1 a 2 das hbiles para completar este proceso.  Los precios de los medicamentos varan con frecuencia dependiendo del Environmental consultant de dnde se surte la receta y alguna farmacias pueden ofrecer precios ms baratos.  El sitio web www.goodrx.com tiene cupones para medicamentos de Airline pilot. Los precios aqu no tienen en cuenta lo que podra costar con la ayuda del seguro (puede ser ms barato con su seguro), pero el sitio web puede darle el precio si no utiliz Research scientist (physical sciences).  - Puede imprimir el cupn correspondiente y llevarlo con su receta a la farmacia.  - Tambin puede pasar por nuestra oficina durante el horario de atencin regular y Charity fundraiser una tarjeta de cupones de GoodRx.  - Si necesita que su receta se enve electrnicamente a una farmacia diferente, informe a nuestra oficina a travs de MyChart de [place] o por telfono llamando al [phone] y presione la opcin 4.

## 2021-12-30 ENCOUNTER — Ambulatory Visit: Payer: Medicare Other | Admitting: Dermatology

## 2022-01-06 ENCOUNTER — Ambulatory Visit: Payer: Medicare Other | Admitting: Dermatology

## 2022-01-06 VITALS — BP 96/62

## 2022-01-06 DIAGNOSIS — I872 Venous insufficiency (chronic) (peripheral): Secondary | ICD-10-CM

## 2022-01-06 NOTE — Progress Notes (Signed)
   Follow-Up Visit   Subjective  Kimberly [name] is a 76 y.o. female who presents for the following: Follow-up.  Patient presents for 1 week follow-up Stasis Dermatitis vs Cellulitis of the left lower leg. She is much improved after 2 weeks of doxycycline, triaminolone 0.1% cream, and continued Furosemide. She is wearing compression daily.   The following portions of the chart were reviewed this encounter and updated as appropriate:       Review of Systems:  No other skin or systemic complaints except as noted in HPI or Assessment and Plan.  Objective  Well appearing patient in no apparent distress; mood and affect are within normal limits.  A focused examination was performed including left lower leg. Relevant physical exam findings are noted in the Assessment and Plan.  Left Lower Leg Decreased edema and erythema of the left lower leg    Assessment & Plan  Venous stasis dermatitis of left lower extremity Left Lower Leg  Chronic and persistent condition with duration or expected duration over one year. Condition is symptomatic/ bothersome to patient. Much improved but not currently at goal.   Continue compression sock daily.  Apply in AM Decrease triamcinolone 0.1% cream to QHS for no more than 3 weeks. Continue CeraVe daily.  Continue Furosemide 20 mg daily as prescribed.   Stasis in the legs causes chronic leg swelling, which may result in itchy or painful rashes, skin discoloration, skin texture changes, and sometimes ulceration.  Recommend daily graduated compression hose/stockings- easiest to put on first thing in morning, remove at bedtime.  Elevate legs as much as possible. Avoid salt/sodium rich foods.   Related Medications triamcinolone cream (KENALOG) 0.1 % Apply 1 Application topically 2 (two) times daily. Qd to bid aa left lower leg until clear, avoid face, groin, axilla   Return as scheduled, for TBSE.  IJamesetta Orleans, CMA, am acting as scribe for Brendolyn Patty, MD .  Documentation: I have reviewed the above documentation for accuracy and completeness, and I agree with the above.  Brendolyn Patty MD

## 2022-01-06 NOTE — Patient Instructions (Addendum)
Continue compression sock daily. Continue CeraVe every morning.  Decrease triamcinolone 0.1% cream to left lower leg nightly for no more than 3 more weeks.  Topical steroids (such as triamcinolone, fluocinolone, fluocinonide, mometasone, clobetasol, halobetasol, betamethasone, hydrocortisone) can cause thinning and lightening of the skin if they are used for too long in the same area. Your physician has selected the right strength medicine for your problem and area affected on the body. Please use your medication only as directed by your physician to prevent side effects.   Due to recent changes in healthcare laws, you may see results of your pathology and/or laboratory studies on MyChart before the doctors have had a chance to review them. We understand that in some cases there may be results that are confusing or concerning to you. Please understand that not all results are received at the same time and often the doctors may need to interpret multiple results in order to provide you with the best plan of care or course of treatment. Therefore, we ask that you please give [place] 2 business days to thoroughly review all your results before contacting the office for clarification. Should we see a critical lab result, you will be contacted sooner.   If You Need Anything After Your Visit  If you have any questions or concerns for your doctor, please call our main line at [phone] and press option 4 to reach your doctor's medical assistant. If no one answers, please leave a voicemail as directed and we will return your call as soon as possible. Messages left after 4 pm will be answered the following business day.   You may also send [place] a message via [place]. We typically respond to MyChart messages within 1-2 business days.  For prescription refills, please ask your pharmacy to contact our office. Our fax number is [phone].  If you have an urgent issue when the clinic is closed that cannot wait  until the next business day, you can page your doctor at the number below.    Please note that while we do our best to be available for urgent issues outside of office hours, we are not available 24/7.   If you have an urgent issue and are unable to reach [place], you may choose to seek medical care at your doctor's office, retail clinic, urgent care center, or emergency room.  If you have a medical emergency, please immediately call 911 or go to the emergency department.  Pager Numbers  - Dr. Nehemiah Massed: [phone]  - Dr. Laurence Ferrari: [phone]  - Dr. Nicole Kindred: [phone]  In the event of inclement weather, please call our main line at [phone] for an update on the status of any delays or closures.  Dermatology Medication Tips: Please keep the boxes that topical medications come in in order to help keep track of the instructions about where and how to use these. Pharmacies typically print the medication instructions only on the boxes and not directly on the medication tubes.   If your medication is too expensive, please contact our office at [phone] option 4 or send [place] a message through [place].   We are unable to tell what your co-pay for medications will be in advance as this is different depending on your insurance coverage. However, we may be able to find a substitute medication at lower cost or fill out paperwork to get insurance to cover a needed medication.   If a prior authorization is required to get your medication covered by your insurance company,  please allow [place] 1-2 business days to complete this process.  Drug prices often vary depending on where the prescription is filled and some pharmacies may offer cheaper prices.  The website www.goodrx.com contains coupons for medications through different pharmacies. The prices here do not account for what the cost may be with help from insurance (it may be cheaper with your insurance), but the website can give you the price if you  did not use any insurance.  - You can print the associated coupon and take it with your prescription to the pharmacy.  - You may also stop by our office during regular business hours and pick up a GoodRx coupon card.  - If you need your prescription sent electronically to a different pharmacy, notify our office through [facility] or by phone at [phone] option 4.     Si Usted Necesita Algo Despus de Su Visita  Tambin puede enviarnos un mensaje a travs de Pharmacist, community. Por lo general respondemos a los mensajes de MyChart en el transcurso de 1 a 2 das hbiles.  Para renovar recetas, por favor pida a su farmacia que se ponga en contacto con nuestra oficina. Harland Dingwall de fax es [place] [phone].  Si tiene un asunto urgente cuando la clnica est cerrada y que no puede esperar hasta el siguiente da hbil, puede llamar/localizar a su doctor(a) al nmero que aparece a continuacin.   Por favor, tenga en cuenta que aunque hacemos todo lo posible para estar disponibles para asuntos urgentes fuera del horario de [place], no estamos disponibles las 24 horas del da, los 7 das de la [place].   Si tiene un problema urgente y no puede comunicarse con nosotros, puede optar por buscar atencin mdica  en el consultorio de su doctor(a), en una clnica privada, en un centro de atencin urgente o en una sala de emergencias.  Si tiene Engineering geologist, por favor llame inmediatamente al 911 o vaya a la sala de emergencias.  Nmeros de bper  - Dr. Nehemiah Massed: [phone]  - Dra. Moye: [phone]  - Dra. Nicole Kindred: [phone]  En caso de inclemencias del [place], por favor llame a Johnsie Kindred principal al [phone] para una actualizacin sobre el [place] de cualquier retraso o cierre.  Consejos para la medicacin en dermatologa: Por favor, guarde las cajas en las que vienen los medicamentos de uso tpico para ayudarle a seguir las instrucciones sobre dnde y cmo usarlos. Las  farmacias generalmente imprimen las instrucciones del medicamento slo en las cajas y no directamente en los tubos del [place].   Si su medicamento es muy caro, por favor, pngase en contacto con Zigmund Daniel llamando al [phone] y presione la opcin 4 o envenos un mensaje a travs de Pharmacist, community.   No podemos decirle cul ser su copago por los medicamentos por adelantado ya que esto es diferente dependiendo de la cobertura de su seguro. Sin embargo, es posible que podamos encontrar un medicamento sustituto a Electrical engineer un formulario para que el seguro cubra el medicamento que se considera necesario.   Si se requiere una autorizacin previa para que su compaa de seguros [place] su medicamento, por favor permtanos de 1 a 2 das hbiles para completar este proceso.  Los precios de los medicamentos varan con frecuencia dependiendo del Environmental consultant de dnde se surte la receta y alguna farmacias pueden ofrecer precios ms baratos.  El sitio web www.goodrx.com tiene cupones para medicamentos de Airline pilot. Los precios aqu no tienen en cuenta lo  que podra costar con la ayuda del seguro (puede ser ms barato con su seguro), pero el sitio web puede darle el precio si no Field seismologist.  - Puede imprimir el cupn correspondiente y llevarlo con su receta a la farmacia.  - Tambin puede pasar por nuestra oficina durante el horario de atencin regular y Charity fundraiser una tarjeta de cupones de GoodRx.  - Si necesita que su receta se enve electrnicamente a una farmacia diferente, informe a nuestra oficina a travs de MyChart de [place] o por telfono llamando al [phone] y presione la opcin 4.

## 2022-01-23 NOTE — Progress Notes (Signed)
Remote ICD transmission.   

## 2022-01-27 ENCOUNTER — Ambulatory Visit: Payer: Self-pay | Admitting: Licensed Clinical Social Worker

## 2022-01-27 NOTE — Patient Outreach (Signed)
  Care Coordination   01/27/2022 Name: Kimberly [name] MRN: [id number] DOB: [birth year]   Care Coordination Outreach Attempts:  An unsuccessful telephone outreach was attempted today to offer the patient information about available care coordination services as a benefit of their health plan.   Follow Up Plan:  Additional outreach attempts will be made to offer the patient care coordination information and services.   Encounter Outcome:  No Answer   Care Coordination Interventions:  No, not indicated    Norva Riffle.Taralynn Quiett MSW, [place] representative [facility] Care Management [phone]

## 2022-01-29 ENCOUNTER — Telehealth (HOSPITAL_COMMUNITY): Payer: Self-pay

## 2022-01-29 ENCOUNTER — Other Ambulatory Visit (HOSPITAL_COMMUNITY): Payer: Self-pay

## 2022-01-29 NOTE — Telephone Encounter (Signed)
Advanced Heart Failure Patient Advocate Encounter  Returned patient call; she is sending patient portion of Entresto renewal to [employer] and needs provider portion submitted as well.  MD/RX form faxed to [employer] on 01/29/22.  Clista Bernhardt, CPhT Rx Patient Advocate Phone: [phone]

## 2022-01-30 NOTE — Telephone Encounter (Signed)
Advanced Heart Failure Patient Advocate Encounter  Patient was approved to receive Entresto from [employer] Effective 01/30/22 to 01/20/23

## 2022-02-03 ENCOUNTER — Ambulatory Visit: Payer: Medicare Other | Admitting: Dermatology

## 2022-02-03 DIAGNOSIS — L820 Inflamed seborrheic keratosis: Secondary | ICD-10-CM | POA: Diagnosis not present

## 2022-02-03 DIAGNOSIS — D692 Other nonthrombocytopenic purpura: Secondary | ICD-10-CM

## 2022-02-03 DIAGNOSIS — D229 Melanocytic nevi, unspecified: Secondary | ICD-10-CM | POA: Diagnosis not present

## 2022-02-03 DIAGNOSIS — Z85828 Personal history of other malignant neoplasm of skin: Secondary | ICD-10-CM | POA: Diagnosis not present

## 2022-02-03 DIAGNOSIS — I8393 Asymptomatic varicose veins of bilateral lower extremities: Secondary | ICD-10-CM

## 2022-02-03 DIAGNOSIS — L821 Other seborrheic keratosis: Secondary | ICD-10-CM

## 2022-02-03 DIAGNOSIS — L814 Other melanin hyperpigmentation: Secondary | ICD-10-CM

## 2022-02-03 DIAGNOSIS — L570 Actinic keratosis: Secondary | ICD-10-CM

## 2022-02-03 DIAGNOSIS — L578 Other skin changes due to chronic exposure to nonionizing radiation: Secondary | ICD-10-CM

## 2022-02-03 DIAGNOSIS — Z1283 Encounter for screening for malignant neoplasm of skin: Secondary | ICD-10-CM | POA: Diagnosis not present

## 2022-02-03 NOTE — Progress Notes (Signed)
Follow-Up Visit   Subjective  Kimberly [name] is a 77 y.o. female who presents for the following: Other (History of BCC of right leg treated at [place] - The patient presents for Total-Body Skin Exam (TBSE) for skin cancer screening and mole check.  The patient has spots, moles and lesions to be evaluated, some may be new or changing and the patient has concerns that these could be cancer./).  Accompanied by husband  The following portions of the chart were reviewed this encounter and updated as appropriate:   Tobacco  Allergies  Meds  Problems  Med Hx  Surg Hx  Fam Hx     Review of Systems:  No other skin or systemic complaints except as noted in HPI or Assessment and Plan.  Objective  Well appearing patient in no apparent distress; mood and affect are within normal limits.  A full examination was performed including scalp, head, eyes, ears, nose, lips, neck, chest, axillae, abdomen, back, buttocks, bilateral upper extremities, bilateral lower extremities, hands, feet, fingers, toes, fingernails, and toenails. All findings within normal limits unless otherwise noted below.  Right Upper Cutaneous Lip Erythematous thin papules/macules with gritty scale.   Left Forearm Erythematous stuck-on, waxy papule or plaque  Legs Spider legs   Assessment & Plan   Purpura - Chronic; persistent and recurrent.  Treatable, but not curable. - Violaceous macules and patches - Benign - Related to trauma, age, sun damage and/or use of blood thinners, chronic use of topical and/or oral steroids - Observe - Can use OTC arnica containing moisturizer such as Dermend Bruise Formula if desired - Call for worsening or other concerns  History of Basal Cell Carcinoma of the Skin - No evidence of recurrence today - Recommend regular full body skin exams - Recommend daily broad spectrum sunscreen SPF 30+ to sun-exposed areas, reapply every 2 hours as needed.  - Call if any new or  changing lesions are noted between office visits  Lentigines - Scattered tan macules - Due to sun exposure - Benign-appearing, observe - Recommend daily broad spectrum sunscreen SPF 30+ to sun-exposed areas, reapply every 2 hours as needed. - Call for any changes  Seborrheic Keratoses - Stuck-on, waxy, tan-brown papules and/or plaques  - Benign-appearing - Discussed benign etiology and prognosis. - Observe - Call for any changes  Melanocytic Nevi - Tan-brown and/or pink-flesh-colored symmetric macules and papules - Benign appearing on exam today - Observation - Call clinic for new or changing moles - Recommend daily use of broad spectrum spf 30+ sunscreen to sun-exposed areas.   Hemangiomas - Red papules - Discussed benign nature - Observe - Call for any changes  Actinic Damage - Chronic condition, secondary to cumulative UV/sun exposure - diffuse scaly erythematous macules with underlying dyspigmentation - Recommend daily broad spectrum sunscreen SPF 30+ to sun-exposed areas, reapply every 2 hours as needed.  - Staying in the shade or wearing long sleeves, sun glasses (UVA+UVB protection) and wide brim hats (4-inch brim around the entire circumference of the hat) are also recommended for sun protection.  - Call for new or changing lesions.  Skin cancer screening performed today.  AK (actinic keratosis) Right Upper Cutaneous Lip Destruction of lesion - Right Upper Cutaneous Lip Complexity: simple   Destruction method: cryotherapy   Informed consent: discussed and consent obtained   Timeout:  patient name, date of birth, surgical site, and procedure verified Lesion destroyed using liquid nitrogen: Yes   Region frozen until ice ball extended  beyond lesion: Yes   Outcome: patient tolerated procedure well with no complications   Post-procedure details: wound care instructions given    Inflamed seborrheic keratosis Left Forearm Symptomatic, irritating, patient would  like treated. Destruction of lesion - Left Forearm Complexity: simple   Destruction method: cryotherapy   Informed consent: discussed and consent obtained   Timeout:  patient name, date of birth, surgical site, and procedure verified Lesion destroyed using liquid nitrogen: Yes   Region frozen until ice ball extended beyond lesion: Yes   Outcome: patient tolerated procedure well with no complications   Post-procedure details: wound care instructions given    Spider veins of both lower extremities Legs Benign-appearing.  Observation.  Call clinic for new or changing lesions.  Recommend daily use of broad spectrum spf 30+ sunscreen to sun-exposed areas.   Return in about 6 months (around 08/04/2022) for AK follow up.  I, Ashok Cordia, CMA, am acting as scribe for Sarina Ser, MD . Documentation: I have reviewed the above documentation for accuracy and completeness, and I agree with the above.  Sarina Ser, MD

## 2022-02-03 NOTE — Patient Instructions (Signed)
Cryotherapy Aftercare  Wash gently with soap and water everyday.   Apply Vaseline and Band-Aid daily until healed.     Due to recent changes in healthcare laws, you may see results of your pathology and/or laboratory studies on MyChart before the doctors have had a chance to review them. We understand that in some cases there may be results that are confusing or concerning to you. Please understand that not all results are received at the same time and often the doctors may need to interpret multiple results in order to provide you with the best plan of care or course of treatment. Therefore, we ask that you please give us 2 business days to thoroughly review all your results before contacting the office for clarification. Should we see a critical lab result, you will be contacted sooner.   If You Need Anything After Your Visit  If you have any questions or concerns for your doctor, please call our main line at [phone] and press option 4 to reach your doctor's medical assistant. If no one answers, please leave a voicemail as directed and we will return your call as soon as possible. Messages left after 4 pm will be answered the following business day.   You may also send us a message via MyChart. We typically respond to MyChart messages within 1-2 business days.  For prescription refills, please ask your pharmacy to contact our office. Our fax number is [phone].  If you have an urgent issue when the clinic is closed that cannot wait until the next business day, you can page your doctor at the number below.    Please note that while we do our best to be available for urgent issues outside of office hours, we are not available 24/7.   If you have an urgent issue and are unable to reach us, you may choose to seek medical care at your doctor's office, retail clinic, urgent care center, or emergency room.  If you have a medical emergency, please immediately call 911 or go to the  emergency department.  Pager Numbers  - Dr. Kowalski: [phone]  - Dr. Moye: [phone]  - Dr. Stewart: [phone]  In the event of inclement weather, please call our main line at [phone] for an update on the status of any delays or closures.  Dermatology Medication Tips: Please keep the boxes that topical medications come in in order to help keep track of the instructions about where and how to use these. Pharmacies typically print the medication instructions only on the boxes and not directly on the medication tubes.   If your medication is too expensive, please contact our office at [phone] option 4 or send us a message through MyChart.   We are unable to tell what your co-pay for medications will be in advance as this is different depending on your insurance coverage. However, we may be able to find a substitute medication at lower cost or fill out paperwork to get insurance to cover a needed medication.   If a prior authorization is required to get your medication covered by your insurance company, please allow us 1-2 business days to complete this process.  Drug prices often vary depending on where the prescription is filled and some pharmacies may offer cheaper prices.  The website www.goodrx.com contains coupons for medications through different pharmacies. The prices here do not account for what the cost may be with help from insurance (it may be cheaper with your insurance), but the website can   give you the price if you did not use any insurance.  - You can print the associated coupon and take it with your prescription to the pharmacy.  - You may also stop by our office during regular business hours and pick up a GoodRx coupon card.  - If you need your prescription sent electronically to a different pharmacy, notify our office through [facility] MyChart or by phone at [phone] option 4.     Si Usted Necesita Algo Despus de Su Visita  Tambin puede  enviarnos un mensaje a travs de MyChart. Por lo general respondemos a los mensajes de MyChart en el transcurso de 1 a 2 das hbiles.  Para renovar recetas, por favor pida a su farmacia que se ponga en contacto con nuestra oficina. Nuestro nmero de fax es el [phone].  Si tiene un asunto urgente cuando la clnica est cerrada y que no puede esperar hasta el siguiente da hbil, puede llamar/localizar a su doctor(a) al nmero que aparece a continuacin.   Por favor, tenga en cuenta que aunque hacemos todo lo posible para estar disponibles para asuntos urgentes fuera del horario de oficina, no estamos disponibles las 24 horas del da, los 7 das de la semana.   Si tiene un problema urgente y no puede comunicarse con nosotros, puede optar por buscar atencin mdica  en el consultorio de su doctor(a), en una clnica privada, en un centro de atencin urgente o en una sala de emergencias.  Si tiene una emergencia mdica, por favor llame inmediatamente al 911 o vaya a la sala de emergencias.  Nmeros de bper  - Dr. Kowalski: [phone]  - Dra. Moye: [phone]  - Dra. Stewart: [phone]  En caso de inclemencias del tiempo, por favor llame a nuestra lnea principal al [phone] para una actualizacin sobre el estado de cualquier retraso o cierre.  Consejos para la medicacin en dermatologa: Por favor, guarde las cajas en las que vienen los medicamentos de uso tpico para ayudarle a seguir las instrucciones sobre dnde y cmo usarlos. Las farmacias generalmente imprimen las instrucciones del medicamento slo en las cajas y no directamente en los tubos del medicamento.   Si su medicamento es muy caro, por favor, pngase en contacto con nuestra oficina llamando al [phone] y presione la opcin 4 o envenos un mensaje a travs de MyChart.   No podemos decirle cul ser su copago por los medicamentos por adelantado ya que esto es diferente dependiendo de la cobertura de su seguro.  Sin embargo, es posible que podamos encontrar un medicamento sustituto a menor costo o llenar un formulario para que el seguro cubra el medicamento que se considera necesario.   Si se requiere una autorizacin previa para que su compaa de seguros cubra su medicamento, por favor permtanos de 1 a 2 das hbiles para completar este proceso.  Los precios de los medicamentos varan con frecuencia dependiendo del lugar de dnde se surte la receta y alguna farmacias pueden ofrecer precios ms baratos.  El sitio web www.goodrx.com tiene cupones para medicamentos de diferentes farmacias. Los precios aqu no tienen en cuenta lo que podra costar con la ayuda del seguro (puede ser ms barato con su seguro), pero el sitio web puede darle el precio si no utiliz ningn seguro.  - Puede imprimir el cupn correspondiente y llevarlo con su receta a la farmacia.  - Tambin puede pasar por nuestra oficina durante el horario de atencin regular y recoger una tarjeta de cupones de GoodRx.  -   Si necesita que su receta se enve electrnicamente a una farmacia diferente, informe a nuestra oficina a travs de MyChart de [facility] o por telfono llamando al [phone] y presione la opcin 4.  

## 2022-02-06 ENCOUNTER — Ambulatory Visit: Payer: Medicare Other | Admitting: Dermatology

## 2022-02-12 ENCOUNTER — Encounter: Payer: Self-pay | Admitting: Dermatology

## 2022-02-14 ENCOUNTER — Ambulatory Visit: Payer: Medicare Other | Attending: Cardiology

## 2022-02-14 DIAGNOSIS — I739 Peripheral vascular disease, unspecified: Secondary | ICD-10-CM | POA: Diagnosis not present

## 2022-02-14 LAB — VAS US ABI WITH/WO TBI
Left ABI: 0.68
Right ABI: 1.01

## 2022-02-24 ENCOUNTER — Telehealth: Payer: Self-pay | Admitting: Cardiovascular Disease

## 2022-02-24 ENCOUNTER — Other Ambulatory Visit: Payer: Self-pay | Admitting: Cardiovascular Disease

## 2022-02-24 ENCOUNTER — Other Ambulatory Visit: Payer: Self-pay

## 2022-02-24 MED ORDER — ROSUVASTATIN CALCIUM 10 MG PO TABS: 10.0000 mg | ORAL_TABLET | Freq: Every day | ORAL | 0 refills | Status: DC

## 2022-02-24 NOTE — Telephone Encounter (Signed)
Requested Prescriptions   Signed Prescriptions Disp Refills   rosuvastatin (CRESTOR) 10 MG tablet 90 tablet 0    Sig: Take 1 tablet (10 mg total) by mouth daily.    Authorizing Provider: Kathlyn Sacramento A    Ordering User: Janan Ridge

## 2022-02-24 NOTE — Telephone Encounter (Signed)
*  STAT* If patient is at the pharmacy, call can be transferred to refill team.   1. Which medications need to be refilled? (please list name of each medication and dose if known) rosuvastatin (CRESTOR) 10 MG tablet   2. Which pharmacy/location (including street and city if local pharmacy) is medication to be sent to? [place], [place]   3. Do they need a 30 day or 90 day supply? [place]

## 2022-02-26 ENCOUNTER — Encounter: Payer: Self-pay | Admitting: Internal Medicine

## 2022-02-27 ENCOUNTER — Ambulatory Visit: Payer: Medicare Other | Attending: Cardiovascular Disease | Admitting: Cardiovascular Disease

## 2022-02-27 ENCOUNTER — Encounter: Payer: Self-pay | Admitting: Cardiovascular Disease

## 2022-02-27 VITALS — BP 100/60 | HR 72 | Temp 97.2°F | Ht 61.0 in | Wt 109.0 lb

## 2022-02-27 DIAGNOSIS — E785 Hyperlipidemia, unspecified: Secondary | ICD-10-CM

## 2022-02-27 DIAGNOSIS — Z720 Tobacco use: Secondary | ICD-10-CM | POA: Diagnosis not present

## 2022-02-27 DIAGNOSIS — I5022 Chronic systolic (congestive) heart failure: Secondary | ICD-10-CM

## 2022-02-27 DIAGNOSIS — I739 Peripheral vascular disease, unspecified: Secondary | ICD-10-CM

## 2022-02-27 MED ORDER — HYDROCODONE BIT-HOMATROP MBR 5-1.5 MG/5ML PO SOLN: 5.0000 mL | Freq: Four times a day (QID) | ORAL | 0 refills | Status: DC | PRN

## 2022-02-27 MED ORDER — METHYLPREDNISOLONE 4 MG PO TBPK: ORAL_TABLET | ORAL | 0 refills | Status: DC

## 2022-02-27 NOTE — Patient Instructions (Signed)
Medication Instructions:  Medrol 4 mg tablets:  Day 1: Take 2 tablets at breakfast, 1 at lunch, 1 at supper and 2 at bedtime Day 2: Take 1 tablet at breakfast, 1 at lunch, 1 at supper, and 2 at bedtime Day 3: Take 1 at breakfast, 1 at lunch, 1 at supper and 1 at bedtime Day 4: Take 1 at breakfast, lunch and bedtime Day 5: Take 1 at breakfast and bedtime Day 6: Take 1 at breakfast   *If you need a refill on your cardiac medications before your next appointment, please call your pharmacy*   Lab Work: None ordered If you have labs (blood work) drawn today and your tests are completely normal, you will receive your results only by: MyChart Message (if you have MyChart) OR A paper copy in the mail If you have any lab test that is abnormal or we need to change your treatment, we will call you to review the results.   Testing/Procedures: None ordered   Follow-Up: At [facility], you and your health needs are our priority.  As part of our continuing mission to provide you with exceptional heart care, we have created designated Provider Care Teams.  These Care Teams include your primary Cardiologist (physician) and Advanced Practice Providers (APPs -  Physician Assistants and Nurse Practitioners) who all work together to provide you with the care you need, when you need it.  We recommend signing up for the patient portal called "MyChart".  Sign up information is provided on this After Visit Summary.  MyChart is used to connect with patients for Virtual Visits (Telemedicine).  Patients are able to view lab/test results, encounter notes, upcoming appointments, etc.  Non-urgent messages can be sent to your provider as well.   To learn more about what you can do with MyChart, go to [email or web address].    Your next appointment:   12 month(s)  Provider:   You may see Dr. Fletcher Anon or one of the following Advanced Practice Providers on your designated Care Team:   Murray Hodgkins,  NP Christell Faith, PA-C Cadence Kathlen Mody, PA-C Gerrie Nordmann, NP

## 2022-02-27 NOTE — Progress Notes (Signed)
Cardiology Office Note   Date:  02/27/2022   ID:  Kimberly [name], Kimberly [name] 1945/11/21, MRN [id number]  PCP:  Biagio Borg, MD  Cardiologist: Dr. Harrington Challenger  Chief Complaint  Patient presents with   12 month follow up     Patient c/o T-99.5 yesterday, coughing, congestion, shortness of breath and lungs are sore. Medications reviewed by the patient verbally.       History of Present Illness: Kimberly [name] is a 77 y.o. female who is here today for a follow-up visit regarding peripheral arterial disease.   She is followed by me for peripheral arterial disease and followed closely at the heart failure clinic.  She has history of congenital heart disease status post VSD repair with multiple prior catheterizations via bilateral femoral arteries when she was a child, SVT status post ablation, hyperlipidemia, COPD, tobacco use, transient atrial fibrillation and chronic systolic heart failure. Patient was seen in 2018 for peripheral arterial disease with nonhealing wound on the right lateral leg above the ankle. She underwent recent noninvasive vascular evaluation which showed a right ABI of 0.62 and left of 0.72. Angiography in 2018 showed moderate left common iliac artery stenosis, short occlusion of right common femoral artery with no significant infrainguinal disease and short occlusion of the left common femoral artery with no significant infrainguinal disease.  I performed successful drug-coated balloon angioplasty and self-expanding stent placement to the right common femoral artery without complications.  She had no recurrent wounds since then. She developed severe restenosis in the right common femoral artery but remained asymptomatic and thus has been monitored closely.   She had recent Doppler studies that showed normal ABI on the right side and moderately reduced on the left at 0.68.  The left common femoral artery is known to be chronically occluded with collaterals.  Right common femoral artery  stent was patent with stable restenosis.  She had recent cellulitis to the left lower extremity but that has healed.  She is dealing with low-grade fever and productive cough.  Past Medical History:  Diagnosis Date   AICD (automatic cardioverter/defibrillator) present 03/17/2017   Anxiety    Atrial tachycardia    Basal cell carcinoma    R ant thigh, L pretibia, both txted at [facility]   Bursitis of shoulder, right, adhesive    Cancer ([place])    CHF (congestive heart failure) ([place])    Chronic bronchitis ([place])    "1-2 times/yr" (01/23/2014)   COPD (chronic obstructive pulmonary disease) ([facility])    CVD (cerebrovascular disease)    Dyslipidemia    Dysrhythmia    Frequency of urination    GERD (gastroesophageal reflux disease)    Heart murmur    History of stomach ulcers    HTN (hypertension) 02/22/2011   Migraines    "stopped many years ago" (06/14/2014)   Osteoporosis 08/19/2016   Pericarditis    Pneumonia "10 times" (06/14/2014)   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)    Silent myocardial infarction [facility]) "late 1990's"   Stress incontinence    "was suppose to have been tacked up years ago but I didn't do it"   Syncope, near    Associated with atrial tachycardia-event recorder 1/16   Thoracic outlet syndrome    VSD (ventricular septal defect)     Past Surgical History:  Procedure Laterality Date   ABDOMINAL AORTOGRAM W/LOWER EXTREMITY N/A 12/17/2016   Procedure: ABDOMINAL AORTOGRAM W/LOWER EXTREMITY;  Surgeon: Wellington Hampshire, MD;  Location: [place] [facility];  Service: Cardiovascular;  Laterality: N/A;   BIV ICD INSERTION CRT-D N/A 03/17/2017   Procedure: BIV ICD INSERTION CRT-D;  Surgeon: Evans Lance, MD;  Location: [place] [facility];  Service: Cardiovascular;  Laterality: N/A;   CARDIAC CATHETERIZATION  "quite a few"   [place]  1970's   CORONARY ANGIOGRAM  2000   No significant CAD    ELECTROPHYSIOLOGIC STUDY N/A 06/14/2014   Procedure: A-Flutter/A-Tach/SVT Ablation;  Surgeon: Evans Lance, MD;  Location: [place] [facility];  Service: Cardiovascular;  Laterality: N/A;   INSERTION OF ICD  03/17/2017   BIV   MULTIPLE TOOTH EXTRACTIONS     MYRINGOTOMY WITH TUBE PLACEMENT Right 2015   PERIPHERAL VASCULAR INTERVENTION  12/17/2016   Procedure: PERIPHERAL VASCULAR INTERVENTION;  Surgeon: Wellington Hampshire, MD;  Location: [place] [facility];  Service: Cardiovascular;;  Right common femoral PTA and Stent   RIGHT/LEFT HEART CATH AND CORONARY ANGIOGRAPHY Bilateral 11/06/2016   Procedure: RIGHT/LEFT HEART CATH AND CORONARY ANGIOGRAPHY;  Surgeon: Wellington Hampshire, MD;  Location: [place] [facility];  Service: Cardiovascular;  Laterality: Bilateral;   SVT ABLATION  06/14/2014   TUBAL LIGATION  1972   VSD REPAIR  1958; 1967     Current Outpatient Medications  Medication Sig Dispense Refill   albuterol (PROVENTIL) (2.5 MG/3ML) 0.083% nebulizer solution Take 3 mLs (2.5 mg total) by nebulization every 6 (six) hours as needed for wheezing or shortness of breath. 75 mL 5   albuterol (VENTOLIN HFA) 108 (90 Base) MCG/ACT inhaler Inhale 2 puffs into the lungs every 6 (six) hours as needed for wheezing or shortness of breath. 8.5 g 3   ALPRAZolam (XANAX) 0.5 MG tablet 1 tab by mouth twice per day as needed 60 tablet 2   aspirin 81 MG tablet Take 1 tablet (81 mg total) by mouth daily. 100 tablet 99   bisoprolol (ZEBETA) 5 MG tablet Take 0.5 tablets (2.5 mg total) by mouth daily. 45 tablet 3   Budeson-Glycopyrrol-Formoterol (BREZTRI AEROSPHERE) 160-9-4.8 MCG/ACT AERO Inhale 2 puffs into the lungs 2 (two) times daily. 32.1 g 3   digoxin (LANOXIN) 0.125 MG tablet Take 0.5 tablets (62.5 mcg total) by mouth daily. 45 tablet 3   furosemide (LASIX) 20 MG tablet Take 1 tablet (20 mg total) by mouth daily as needed. 90 tablet 1   ibuprofen (ADVIL,MOTRIN) 200 MG tablet Take 200 mg by mouth daily as  needed for headache.     rosuvastatin (CRESTOR) 10 MG tablet Take 1 tablet (10 mg total) by mouth daily. 90 tablet 0   sacubitril-valsartan (ENTRESTO) 24-26 MG Take 0.5 tablets by mouth 2 (two) times daily.     spironolactone (ALDACTONE) 25 MG tablet Take 12.5 mg by mouth at bedtime.     tiZANidine (ZANAFLEX) 2 MG tablet Take 1 tablet (2 mg total) by mouth every 6 (six) hours as needed for muscle spasms. 40 tablet 1   triamcinolone cream (KENALOG) 0.1 % Apply 1 Application topically 2 (two) times daily. Qd to bid aa left lower leg until clear, avoid face, groin, axilla 80 g 1   No current facility-administered medications for this visit.    Allergies:   Mupirocin and Codeine    Social History:  The patient  reports that she has been smoking cigarettes. She has a 17.50 pack-year smoking history. She has never used smokeless tobacco. She reports that she does not currently use alcohol after  a past usage of about 1.0 standard drink of alcohol per week. She reports that she does not use drugs.   Family History:  The patient's family history includes Alcohol abuse in an other family member; Arthritis in some other family members; Cancer in her father, mother, and another family member; Hypertension in some other family members; Stroke in some other family members; Throat cancer in an other family member.    ROS:  Please see the history of present illness.   Otherwise, review of systems are positive for none.   All other systems are reviewed and negative.    PHYSICAL EXAM: VS:  BP 100/60 (BP Location: Left Arm, Patient Position: Sitting, Cuff Size: Normal)   Pulse 72   Temp (!) 97.2 F (36.2 C)   Ht '5\' 1"'$  (1.549 m)   Wt 109 lb (49.4 kg)   BMI 20.60 kg/m  , BMI Body mass index is 20.6 kg/m. GEN: Well nourished, well developed, in no acute distress  HEENT: normal  Neck: no JVD, carotid bruits, or masses Cardiac: RRR; no  rubs, or gallops,no edema. 2/f 6 holosystolic murmur at the left  sternal border and apex. Respiratory:  clear to auscultation bilaterally, normal work of breathing GI: soft, nontender, nondistended, + BS MS: no deformity or atrophy  Skin: warm and dry, no rash Neuro:  Strength and sensation are intact Psych: euthymic mood, full affect Vascular: Radial pulse is absent bilaterally.    EKG:  EKG is ordered today. The ekg ordered today demonstrates ventricular paced rhythm.   Recent Labs: 04/16/2021: TSH 0.87 10/31/2021: ALT 7; Hemoglobin 11.8; Platelets 251.0 12/04/2021: B Natriuretic Peptide 315.9; BUN 16; Creatinine, Ser 0.99; Potassium 4.3; Sodium 139    Lipid Panel    Component Value Date/Time   CHOL 169 09/06/2021 1102   TRIG 49 09/06/2021 1102   HDL 91 09/06/2021 1102   CHOLHDL 1.9 09/06/2021 1102   VLDL 10 09/06/2021 1102   LDLCALC 68 09/06/2021 1102   LDLDIRECT 112.6 03/16/2013 1057      Wt Readings from Last 3 Encounters:  02/27/22 109 lb (49.4 kg)  12/04/21 110 lb (49.9 kg)  10/31/21 112 lb (50.8 kg)           No data to display            ASSESSMENT AND PLAN:  1.  Peripheral arterial disease .  Status post endovascular revascularization of the right common femoral artery with stable moderate restenosis.  She has no symptoms on the right side.  She is known to have chronically occluded left common femoral artery with collaterals.  She does not have lifestyle limiting claudication in the left and recent ulceration and cellulitis healed.  Thus, recommend continuing medical therapy for now.  Angiography will be considered if she has worsening symptoms on the left.  2.  Chronic systolic heart failure: Due to nonischemic cardiomyopathy.  She appears to be euvolemic and currently on optimal medical therapy followed at the heart failure clinic.     3.  Hyperlipidemia: Continue treatment with rosuvastatin with a target LDL of less than 70.  Most recent lipid profile showed an LDL of 68.   4.  Tobacco use: I discussed the  importance of smoking cessation.  5.  Acute bronchitis: I prescribed a Medrol Dosepak.  I asked her to follow-up with her primary care physician within 1 to 2 weeks.    Disposition:   FU with me in 6 months  Signed,  Kathlyn Sacramento,  MD  02/27/2022 8:30 AM    [facility]

## 2022-02-27 NOTE — Telephone Encounter (Signed)
Pt requesting refill on the hydrocodone cough syrup. Has made appt for 03/06/22.Marland KitchenJohny Chess

## 2022-03-02 ENCOUNTER — Emergency Department
Admission: EM | Admit: 2022-03-02 | Discharge: 2022-03-02 | Disposition: A | Discharge status: Home / Self Care | Payer: Medicare Other | Attending: Emergency Medicine | Admitting: Emergency Medicine

## 2022-03-02 ENCOUNTER — Other Ambulatory Visit: Payer: Self-pay

## 2022-03-02 ENCOUNTER — Emergency Department: Payer: Medicare Other

## 2022-03-02 DIAGNOSIS — B029 Zoster without complications: Secondary | ICD-10-CM | POA: Diagnosis not present

## 2022-03-02 DIAGNOSIS — R052 Subacute cough: Secondary | ICD-10-CM

## 2022-03-02 DIAGNOSIS — R059 Cough, unspecified: Secondary | ICD-10-CM | POA: Diagnosis not present

## 2022-03-02 DIAGNOSIS — Z7951 Long term (current) use of inhaled steroids: Secondary | ICD-10-CM | POA: Insufficient documentation

## 2022-03-02 DIAGNOSIS — Z7982 Long term (current) use of aspirin: Secondary | ICD-10-CM | POA: Diagnosis not present

## 2022-03-02 DIAGNOSIS — I509 Heart failure, unspecified: Secondary | ICD-10-CM | POA: Diagnosis not present

## 2022-03-02 DIAGNOSIS — J449 Chronic obstructive pulmonary disease, unspecified: Secondary | ICD-10-CM | POA: Insufficient documentation

## 2022-03-02 DIAGNOSIS — J40 Bronchitis, not specified as acute or chronic: Secondary | ICD-10-CM | POA: Diagnosis not present

## 2022-03-02 DIAGNOSIS — R21 Rash and other nonspecific skin eruption: Secondary | ICD-10-CM

## 2022-03-02 MED ORDER — DOXYCYCLINE MONOHYDRATE 100 MG PO TABS: 100.0000 mg | ORAL_TABLET | Freq: Two times a day (BID) | ORAL | 0 refills | Status: AC

## 2022-03-02 MED ORDER — ACYCLOVIR 800 MG PO TABS: 800.0000 mg | ORAL_TABLET | Freq: Every day | ORAL | 0 refills | Status: AC

## 2022-03-02 NOTE — ED Provider Notes (Signed)
[facility] Provider Note   CSN: [id number] Arrival date & time: 03/02/22  0741     History  Chief Complaint  Patient presents with   Rash    Kimberly [name] is a 77 y.o. female.  With history of COPD, cardiomyopathy, CHF presents to the emergency department for evaluation of rash and a cough.  Patient said a cough for greater than a week.  She was seen by cardiologist and diagnosed with bronchitis and placed on a prednisone taper.  During the same time she developed a rash along her right thoracic spine along the right ribs going along the right abdomen.  Rash is pruritic, erythematous and minimally painful.  Rash does not cross the midline.  No other rash throughout her body.  Patient has been taking prednisone and rashes been improving.  She is concerned about her cough, she states her cough is productive.  She has COPD.  She denies any chest pain or shortness of breath.  She has been without fevers.  No facial swelling  HPI     Home Medications Prior to Admission medications   Medication Sig Start Date End Date Taking? Authorizing Provider  acyclovir (ZOVIRAX) 800 MG tablet Take 1 tablet (800 mg total) by mouth 5 (five) times daily for 7 days. 03/02/22 03/09/22 Yes Duanne Guess, PA-C  doxycycline (ADOXA) 100 MG tablet Take 1 tablet (100 mg total) by mouth 2 (two) times daily for 7 days. 03/02/22 03/09/22 Yes Duanne Guess, PA-C  albuterol (PROVENTIL) (2.5 MG/3ML) 0.083% nebulizer solution Take 3 mLs (2.5 mg total) by nebulization every 6 (six) hours as needed for wheezing or shortness of breath. 08/08/21   Cobb, Karie Schwalbe, NP  albuterol (VENTOLIN HFA) 108 (90 Base) MCG/ACT inhaler Inhale 2 puffs into the lungs every 6 (six) hours as needed for wheezing or shortness of breath. 09/16/21   Cobb, Karie Schwalbe, NP  ALPRAZolam Duanne Moron) 0.5 MG tablet 1 tab by mouth twice per day as needed 09/04/21   Biagio Borg, MD  aspirin 81 MG tablet Take 1  tablet (81 mg total) by mouth daily. 07/24/16   Biagio Borg, MD  bisoprolol (ZEBETA) 5 MG tablet Take 0.5 tablets (2.5 mg total) by mouth daily. 06/05/21   Milford, Maricela Bo, FNP  Budeson-Glycopyrrol-Formoterol (BREZTRI AEROSPHERE) 160-9-4.8 MCG/ACT AERO Inhale 2 puffs into the lungs 2 (two) times daily. 09/19/21   Cobb, Karie Schwalbe, NP  digoxin (LANOXIN) 0.125 MG tablet Take 0.5 tablets (62.5 mcg total) by mouth daily. 06/05/21   [place]ford, Maricela Bo, FNP  furosemide (LASIX) 20 MG tablet Take 1 tablet (20 mg total) by mouth daily as needed. 06/27/21   Larey Dresser, MD  HYDROcodone bit-homatropine (HYCODAN) 5-1.5 MG/5ML syrup Take 5 mLs by mouth every 6 (six) hours as needed for up to 10 days. 02/27/22 03/09/22  Biagio Borg, MD  ibuprofen (ADVIL,MOTRIN) 200 MG tablet Take 200 mg by mouth daily as needed for headache.    [provider]  methylPREDNISolone (MEDROL DOSEPAK) 4 MG TBPK tablet Day 1: Take 2 tablets at breakfast, 1 at lunch, 1 at supper and 2 at bedtime Day 2: Take 1 tablet at breakfast, 1 at lunch, 1 at supper, and 2 at bedtime Day 3: Take 1 at breakfast, 1 at lunch, 1 at supper and 1 at bedtime Day 4: Take 1 at breakfast, lunch and bedtime Day 5: Take 1 at breakfast and bedtime Day 6: Take 1 at breakfast  02/27/22   Wellington Hampshire, MD  rosuvastatin (CRESTOR) 10 MG tablet Take 1 tablet (10 mg total) by mouth daily. 02/24/22   Wellington Hampshire, MD  sacubitril-valsartan (ENTRESTO) 24-26 MG Take 0.5 tablets by mouth 2 (two) times daily.    [provider]  spironolactone (ALDACTONE) 25 MG tablet Take 12.5 mg by mouth at bedtime.    [provider]  tiZANidine (ZANAFLEX) 2 MG tablet Take 1 tablet (2 mg total) by mouth every 6 (six) hours as needed for muscle spasms. 05/14/21   Biagio Borg, MD  triamcinolone cream (KENALOG) 0.1 % Apply 1 Application topically 2 (two) times daily. Qd to bid aa left lower leg until clear, avoid face, groin, axilla 12/23/21   Brendolyn Patty, MD       Allergies    Mupirocin and Codeine    Review of Systems   Review of Systems  Physical Exam Updated Vital Signs BP 108/67 (BP Location: Left Arm)   Pulse 63   Temp 97.9 F (36.6 C) (Oral)   Resp 18   Ht 5' 1"$  (1.549 m)   Wt 49 kg   SpO2 94%   BMI 20.41 kg/m  Physical Exam Constitutional:      General: She is not in acute distress.    Appearance: She is well-developed.  HENT:     Head: Normocephalic and atraumatic.     Jaw: No trismus.     Right Ear: External ear normal.     Left Ear: External ear normal.     Nose: Nose normal.     Mouth/Throat:     Mouth: No oral lesions.     Dentition: Dental caries and dental abscesses present.     Pharynx: Oropharynx is clear. Uvula midline. No oropharyngeal exudate, posterior oropharyngeal erythema or uvula swelling.  Eyes:     Conjunctiva/sclera: Conjunctivae normal.     Pupils: Pupils are equal, round, and reactive to light.  Cardiovascular:     Rate and Rhythm: Normal rate.     Heart sounds: No murmur heard.    No friction rub. No gallop.  Pulmonary:     Effort: Pulmonary effort is normal. No respiratory distress.     Breath sounds: Normal breath sounds. No wheezing or rales.  Abdominal:     General: Abdomen is flat. Bowel sounds are normal. There is no distension.     Tenderness: There is no abdominal tenderness. There is no guarding.     Comments: Right lower thoracic region on the right side there is a erythematous papular rash that is dry appearing that is running along the thoracic dermatomal distribution along the abdomen.  Rash does not cross the midline.  No signs of cellulitis.  Rashes minimally tender to palpation.  Musculoskeletal:        General: Normal range of motion.     Cervical back: Normal range of motion and neck supple.  Skin:    General: Skin is warm and dry.     Findings: No rash.     Comments: No other rash except for what seen on the right flank  Neurological:     General: No focal deficit  present.     Mental Status: She is alert and oriented to person, place, and time. Mental status is at baseline.     Cranial Nerves: No cranial nerve deficit.     Motor: No weakness.     Gait: Gait normal.  Psychiatric:  Mood and Affect: Mood normal.        Behavior: Behavior normal.        Thought Content: Thought content normal.     ED Results / Procedures / Treatments   Labs (all labs ordered are listed, but only abnormal results are displayed) Labs Reviewed - No data to display  EKG None  Radiology DG Chest 2 View  Result Date: 03/02/2022 CLINICAL DATA:  Cough EXAM: CHEST - 2 VIEW COMPARISON:  Prior chest x-ray 10/31/2021 FINDINGS: Stable right subclavian approach biventricular cardiac rhythm maintenance device with leads projecting over the right atrium, right ventricle and overlying the left heart. Cardiac and mediastinal contours are unchanged. Patient is status post median sternotomy with evidence of prior multivessel CABG. Atherosclerotic calcifications present in the transverse aorta. Significant pulmonary hyperinflation with chronic bronchitic changes is similar compared to prior. No focal airspace infiltrate, pulmonary edema, pleural effusion or pneumothorax. No suspicious mass or nodule. No acute osseous abnormality. IMPRESSION: 1. No acute cardiopulmonary process. 2. Similar appearance of hyperinflation and chronic bronchitic changes suggesting underlying COPD. Electronically Signed   By: Jacqulynn Cadet M.D.   On: 03/02/2022 08:30    Procedures Procedures    Medications Ordered in ED Medications - No data to display  ED Course/ Medical Decision Making/ A&P                             Medical Decision Making Amount and/or Complexity of Data Reviewed Radiology: ordered.   77 year old female with cough consistent with bronchitis.  She does have COPD with slight increase in sputum production.  Will place on antibiotic, doxycycline.  She also has rash  consistent with shingles and due to being over the age of 57 with continued vesicular lesions, will place on acyclovir.  She will continue out her prednisone taper.  Chest x-ray obtained showing no signs of pneumonia.  Her vital signs are stable, she is afebrile with normal pulse and O2 sats.  Patient appears well in no distress. Final Clinical Impression(s) / ED Diagnoses Final diagnoses:  Rash  Subacute cough  Herpes zoster without complication  Bronchitis    Rx / DC Orders ED Discharge Orders          Ordered    doxycycline (ADOXA) 100 MG tablet  2 times daily        03/02/22 0858    acyclovir (ZOVIRAX) 800 MG tablet  5 times daily        03/02/22 0858              Duanne Guess, PA-C 03/02/22 0900    Carrie Mew, MD 03/03/22 1843

## 2022-03-02 NOTE — Discharge Instructions (Signed)
Please continue with prednisone.  You may take antibiotic and antiviral medication as prescribed for 1 week.  Return to the ER for any fevers worsening symptoms, cough, shortness of breath or any urgent changes in your health

## 2022-03-02 NOTE — ED Triage Notes (Signed)
Pt reports has a rash on her right side and is concerned she has shingles. Pt states it does not hurt but it does feel funny. Pt states when her shirt hit it the other day it felt funny also

## 2022-03-03 ENCOUNTER — Telehealth: Payer: Self-pay

## 2022-03-03 MED ORDER — GABAPENTIN 300 MG PO CAPS: 300.0000 mg | ORAL_CAPSULE | ORAL | 0 refills | Status: DC

## 2022-03-03 NOTE — Telephone Encounter (Signed)
Advised pt we could send in Gabapentin 349m 1-3 po qd.  Advised pt to start out 1 po qpm, if that was not helping she could take 1 po bid, and if that still was not helping she could take 1 po tid.  Advised pt may make drowsy and do not drive a vehicle while taking the gabapentin./sh

## 2022-03-03 NOTE — Telephone Encounter (Signed)
Patient called stating she went to ER yesterday and she has Shingles.  They gave her Acyclovir 852m 5x/day for 7 days, they started her on Doxycycline 1031m1 po bid for 7 days and they told her to finish her Prednisone taper.  Pt is stating she is in a lot of pain and would like for you to prescribe something for the pain./sh

## 2022-03-04 ENCOUNTER — Encounter: Payer: Self-pay | Admitting: Licensed Clinical Social Worker

## 2022-03-06 ENCOUNTER — Telehealth (INDEPENDENT_AMBULATORY_CARE_PROVIDER_SITE_OTHER): Payer: Medicare Other | Admitting: Internal Medicine

## 2022-03-06 ENCOUNTER — Encounter (HOSPITAL_COMMUNITY): Payer: Medicare Other

## 2022-03-06 ENCOUNTER — Encounter: Payer: Self-pay | Admitting: Internal Medicine

## 2022-03-06 DIAGNOSIS — B029 Zoster without complications: Secondary | ICD-10-CM | POA: Diagnosis not present

## 2022-03-06 DIAGNOSIS — J441 Chronic obstructive pulmonary disease with (acute) exacerbation: Secondary | ICD-10-CM | POA: Diagnosis not present

## 2022-03-06 DIAGNOSIS — F419 Anxiety disorder, unspecified: Secondary | ICD-10-CM

## 2022-03-06 MED ORDER — ALPRAZOLAM 0.5 MG PO TABS: ORAL_TABLET | ORAL | 2 refills | Status: DC

## 2022-03-06 MED ORDER — HYDROCOD POLI-CHLORPHE POLI ER 10-8 MG/5ML PO SUER: 5.0000 mL | Freq: Two times a day (BID) | ORAL | 0 refills | Status: DC | PRN

## 2022-03-06 MED ORDER — HYDROCODONE-ACETAMINOPHEN 5-325 MG PO TABS: 1.0000 | ORAL_TABLET | Freq: Four times a day (QID) | ORAL | 0 refills | Status: DC | PRN

## 2022-03-06 NOTE — Assessment & Plan Note (Signed)
Some better with acyclovir and gabapentin for pain, will add hydrocodone 5 325 prn as well, limited rx

## 2022-03-06 NOTE — Progress Notes (Signed)
Patient ID: Kimberly [name], female   DOB: [birth year], 77 y.o.   MRN: [id number]    Virtual Visit via Video Note  I connected with Kimberly [name] on 03/06/22 at  1:20 PM EST by a video enabled telemedicine application and verified that I am speaking with the correct person using two identifiers.  Location of all participants today Patient: at home Provider: at office   I discussed the limitations of evaluation and management by telemedicine and the availability of in person appointments. The patient expressed understanding and agreed to proceed.  History of Present Illness: Here after recent hx beginning about 2 wks ago with prednisone course added per vascular with wheezing on exam that day, later seen at ED feb 11 with addition of doxycycline, and overall improved today except cough still keeps her up at night.  Also found to have shingles outbreak at ED, tx with acyclovi course, derm added gabapentin by phone on follow up.  Pt denies chest pain, increased sob or doe, wheezing, orthopnea, PND, increased LE swelling, palpitations, dizziness or syncope.   Pt denies polydipsia, polyuria, or new focal neuro s/s.   Denies worsening depressive symptoms, suicidal ideation, or panic; has ongoing anxiety worse recently, asks for xanax refill.  Past Medical History:  Diagnosis Date   AICD (automatic cardioverter/defibrillator) present 03/17/2017   Anxiety    Atrial tachycardia    Basal cell carcinoma    R ant thigh, L pretibia, both txted at [facility]   Bursitis of shoulder, right, adhesive    Cancer ([place])    CHF (congestive heart failure) ([place])    Chronic bronchitis ([place])    "1-2 times/yr" (01/23/2014)   COPD (chronic obstructive pulmonary disease) ([facility])    CVD (cerebrovascular disease)    Dyslipidemia    Dysrhythmia    Frequency of urination    GERD (gastroesophageal reflux disease)    Heart murmur    History of stomach ulcers    HTN (hypertension) 02/22/2011   Migraines    "stopped  many years ago" (06/14/2014)   Osteoporosis 08/19/2016   Pericarditis    Pneumonia "10 times" (06/14/2014)   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)    Silent myocardial infarction [facility]) "late 1990's"   Stress incontinence    "was suppose to have been tacked up years ago but I didn't do it"   Syncope, near    Associated with atrial tachycardia-event recorder 1/16   Thoracic outlet syndrome    VSD (ventricular septal defect)    Past Surgical History:  Procedure Laterality Date   ABDOMINAL AORTOGRAM W/LOWER EXTREMITY N/A 12/17/2016   Procedure: ABDOMINAL AORTOGRAM W/LOWER EXTREMITY;  Surgeon: Wellington Hampshire, MD;  Location: [place] [facility];  Service: Cardiovascular;  Laterality: N/A;   BIV ICD INSERTION CRT-D N/A 03/17/2017   Procedure: BIV ICD INSERTION CRT-D;  Surgeon: Evans Lance, MD;  Location: [place] [facility];  Service: Cardiovascular;  Laterality: N/A;   CARDIAC CATHETERIZATION  "quite a few"   [place]  1970's   CORONARY ANGIOGRAM  2000   No significant CAD   ELECTROPHYSIOLOGIC STUDY N/A 06/14/2014   Procedure: A-Flutter/A-Tach/SVT Ablation;  Surgeon: Evans Lance, MD;  Location: [place] [facility];  Service: Cardiovascular;  Laterality: N/A;   INSERTION OF ICD  03/17/2017   BIV   MULTIPLE TOOTH EXTRACTIONS     MYRINGOTOMY WITH TUBE PLACEMENT Right 2015   PERIPHERAL VASCULAR INTERVENTION  12/17/2016  Procedure: PERIPHERAL VASCULAR INTERVENTION;  Surgeon: Wellington Hampshire, MD;  Location: [place] [facility];  Service: Cardiovascular;;  Right common femoral PTA and Stent   RIGHT/LEFT HEART CATH AND CORONARY ANGIOGRAPHY Bilateral 11/06/2016   Procedure: RIGHT/LEFT HEART CATH AND CORONARY ANGIOGRAPHY;  Surgeon: Wellington Hampshire, MD;  Location: [place] [facility];  Service: Cardiovascular;  Laterality: Bilateral;   SVT ABLATION  06/14/2014   TUBAL LIGATION  1972   VSD REPAIR  1958; 1967     reports that she has been smoking cigarettes. She has a 17.50 pack-year smoking history. She has never used smokeless tobacco. She reports that she does not currently use alcohol after a past usage of about 1.0 standard drink of alcohol per week. She reports that she does not use drugs. family history includes Alcohol abuse in an other family member; Arthritis in some other family members; Cancer in her father, mother, and another family member; Hypertension in some other family members; Stroke in some other family members; Throat cancer in an other family member. Allergies  Allergen Reactions   Mupirocin Shortness Of Breath and Other (See Comments)    Burning, pain, swelling and sob   Codeine Nausea Only   Current Outpatient Medications on File Prior to Visit  Medication Sig Dispense Refill   gabapentin (NEURONTIN) 300 MG capsule Take 1 capsule (300 mg total) by mouth as directed. Take 1 po up to tid, start out taking 1 po qpm, if not improving may increase to 1 po bid, if still not improving increase to 1 po tid, may make drowsy, do not drive while taking this medication 90 capsule 0   acyclovir (ZOVIRAX) 800 MG tablet Take 1 tablet (800 mg total) by mouth 5 (five) times daily for 7 days. 35 tablet 0   albuterol (PROVENTIL) (2.5 MG/3ML) 0.083% nebulizer solution Take 3 mLs (2.5 mg total) by nebulization every 6 (six) hours as needed for wheezing or shortness of breath. 75 mL 5   albuterol (VENTOLIN HFA) 108 (90 Base) MCG/ACT inhaler Inhale 2 puffs into the lungs every 6 (six) hours as needed for wheezing or shortness of breath. 8.5 g 3   aspirin 81 MG tablet Take 1 tablet (81 mg total) by mouth daily. 100 tablet 99   bisoprolol (ZEBETA) 5 MG tablet Take 0.5 tablets (2.5 mg total) by mouth daily. 45 tablet 3   Budeson-Glycopyrrol-Formoterol (BREZTRI AEROSPHERE) 160-9-4.8 MCG/ACT AERO Inhale 2 puffs into the lungs 2 (two) times daily. 32.1 g 3   digoxin (LANOXIN) 0.125 MG tablet Take 0.5 tablets  (62.5 mcg total) by mouth daily. 45 tablet 3   doxycycline (ADOXA) 100 MG tablet Take 1 tablet (100 mg total) by mouth 2 (two) times daily for 7 days. 14 tablet 0   furosemide (LASIX) 20 MG tablet Take 1 tablet (20 mg total) by mouth daily as needed. 90 tablet 1   ibuprofen (ADVIL,MOTRIN) 200 MG tablet Take 200 mg by mouth daily as needed for headache.     methylPREDNISolone (MEDROL DOSEPAK) 4 MG TBPK tablet Day 1: Take 2 tablets at breakfast, 1 at lunch, 1 at supper and 2 at bedtime Day 2: Take 1 tablet at breakfast, 1 at lunch, 1 at supper, and 2 at bedtime Day 3: Take 1 at breakfast, 1 at lunch, 1 at supper and 1 at bedtime Day 4: Take 1 at breakfast, lunch and bedtime Day 5: Take 1 at breakfast and bedtime Day 6: Take 1 at breakfast 21 tablet 0  rosuvastatin (CRESTOR) 10 MG tablet Take 1 tablet (10 mg total) by mouth daily. 90 tablet 0   sacubitril-valsartan (ENTRESTO) 24-26 MG Take 0.5 tablets by mouth 2 (two) times daily.     spironolactone (ALDACTONE) 25 MG tablet Take 12.5 mg by mouth at bedtime.     tiZANidine (ZANAFLEX) 2 MG tablet Take 1 tablet (2 mg total) by mouth every 6 (six) hours as needed for muscle spasms. 40 tablet 1   triamcinolone cream (KENALOG) 0.1 % Apply 1 Application topically 2 (two) times daily. Qd to bid aa left lower leg until clear, avoid face, groin, axilla 80 g 1   No current facility-administered medications on file prior to visit.    Observations/Objective: Alert, NAD, appropriate mood and affect, resps normal, cn 2-12 intact, moves all 4s, no visible rash or swelling Lab Results  Component Value Date   WBC 7.4 10/31/2021   HGB 11.8 (L) 10/31/2021   HCT 35.1 (L) 10/31/2021   PLT 251.0 10/31/2021   GLUCOSE 94 12/04/2021   CHOL 169 09/06/2021   TRIG 49 09/06/2021   HDL 91 09/06/2021   LDLDIRECT 112.6 03/16/2013   LDLCALC 68 09/06/2021   ALT 7 10/31/2021   AST 12 10/31/2021   NA 139 12/04/2021   K 4.3 12/04/2021   CL 103 12/04/2021   CREATININE 0.99  12/04/2021   BUN 16 12/04/2021   CO2 27 12/04/2021   TSH 0.87 04/16/2021   INR 1.02 12/15/2016   HGBA1C 6.3 04/16/2021   Assessment and Plan: See notes  Follow Up Instructions: See notes   I discussed the assessment and treatment plan with the patient. The patient was provided an opportunity to ask questions and all were answered. The patient agreed with the plan and demonstrated an understanding of the instructions.   The patient was advised to call back or seek an in-person evaluation if the symptoms worsen or if the condition fails to improve as anticipated.  Cathlean Cower, MD

## 2022-03-06 NOTE — Assessment & Plan Note (Signed)
Ok for xanax refill with recent situational worsening

## 2022-03-06 NOTE — Patient Instructions (Signed)
Please take all new medication as prescribed 

## 2022-03-06 NOTE — Assessment & Plan Note (Signed)
Overall clinically improved, to add tussionex for cough,  to f/u any worsening symptoms or concerns

## 2022-03-10 NOTE — Progress Notes (Incomplete)
PCP: Dr. Jenny Reichmann Cardiology: Dr. Harrington Challenger HF Cardiology: Dr. Aundra Dubin Cardiology/Vascular: Dr Fletcher Anon  77 y.o.with history of VSD repair as a child, COPD/active smoking, PAD, and chronic systolic CHF.  Patient has a long history of cardiomyopathy.  She had VSD repaired as a child and imaging has not showed residual VSD.  Cardiac MRI in 11/09 showed EF 36%.  Echo in 12/15 showed EF 40-45%.  Echo in 3/18 showed EF down to 15-20% with moderate RV systolic dysfunction.  RHC/LHC in 10/18 showed nonobstructive coronary but was concerning for low output (CI 1.33).  She had a nonhealing right lower extremity wound.  Peripheral angiogram with bilateral CFA occlusions and underwent DCB/self expanding stent to to right CFA.    CPX in 1/19 showed moderate to severe functional limitation, combination of HF and lung disease, probably more related to the lung disease.  PFTs in 12/18 showed severe COPD.   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    7/19 peripheral arterial dopplers showed significant in-stent restenosis right CFA stent.  Echo in 4/21 showed EF 35-40% with mild AI.  CPX in 5/21 showed severe COPD, mild-moderate HF limitation.  11/21 Cardiolite showed prior septal and apical infarction, no ischemia.   Had COVID April 2022. She has continued to recover.   Echo in 9/22 showed EF 35-40% with basal-mid septal akinesis and anterior hypokinesis, no evidence for residual VSD, mildly decreased RV systolic function.  Echo was done today and reviewed, EF 40% with basal to mid anteroseptal and inferoseptal akinesis, mild RV dysfunction, PASP 33 mmHg.   She returns for followup of CHF. She is still smoking.  Says she cannot stop now due to stress.  She has been caring for her husband who has had a number of complications post-back surgery.  She reports fatigue and poor energy. 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  This is chronic.  She has occasional atypical chest pain, no  change to pattern.  Weight is stable.  She had a left leg ulcer after a skin cancer removal, this is getting better.    Labs (12/22): BNP 418, digoxin 0.5, K 5, creatinine 1.17 Labs (8/23): BNP 222, digoxin 0.7, LDL 68, K 4.4, creatinine 0.91 Labs (10/23): K 3.7, creatinine 0.96  [name] CRT-D device interrogation: Heartlogic 0, no AF/VT  PMH: 1. VSD: s/p repair at [facility] as child.  From description, sounds like muscular VSD.  2. Atrial tachycardia s/p ablation.  3. Hyperlipidemia 4. COPD: Active smoker.  - PFTs (12/18): Severe obstructive lung disease.  5. Atrial fibrillation: Paroxysmal.   Noted only transiently.   6. PAD:  - Angiogram 11/18 showed totally occluded bilateral CFAs.  Patient had stent to right CFA.  ABIs in 12/18 were normal on right. - ABIs (10/20): ABI 0.8 right, 0.74 left - 12/21 dopplers with R CFA stent in-stenosis.  7. Chronic systolic CHF: Nonischemic cardiomyopathy.   - cMRI (11/09): EF 36%, VSD patch in anterior ventricular septum - Echo (12/15): EF 40-45% - Echo (3/18): EF 15-20%, moderate LV dilation, moderate MR, moderate RV dilation with moderately decreased systolic function.  - LHC/RHC (10/18): 3+ MR, EF < 25%, minimal nonobstructive CAD; PA 41/21, LVEDP 18, CI 1.33.  - CPX (1/19): peak VO2 13.4, VE/VCO2 slope 43, RER 1.07.  Mod-severe functional limitation due to combination of HF and lung disease, probably more related to lung disease.  - [name] CRT-D device.  - Echo (7/19): EF 25-30%, mild LV dilation,  mild MR, normal RV size and systolic function.  - Echo (4/21): EF 35-40%, mild AI - CPX (5/21): severe COPD, mild-moderate HF limitation.  - Cardiolite (11/21): EF 47%, prior septal and apical infarction, no ischemia. - Echo (9/22): EF 35-40% with basal-mid septal akinesis and anterior hypokinesis, no evidence for residual VSD, mildly decreased RV systolic function.  - Echo (11/23): EF 40% with basal to mid anteroseptal and  inferoseptal akinesis, mild RV dysfunction, PASP 33 mmHg.  8. PVCs: Zio patch 9/19 with 6% PVCs.  9. COVID-19 (4/22)  SH: Smoker, lives in [place], married.  FH: Mother with PAD, sister died at birth from congenital heart disease.   ROS: All systems reviewed and negative except as per HPI.   Current Outpatient Medications  Medication Sig Dispense Refill   gabapentin (NEURONTIN) 300 MG capsule Take 1 capsule (300 mg total) by mouth as directed. Take 1 po up to tid, start out taking 1 po qpm, if not improving may increase to 1 po bid, if still not improving increase to 1 po tid, may make drowsy, do not drive while taking this medication 90 capsule 0   albuterol (PROVENTIL) (2.5 MG/3ML) 0.083% nebulizer solution Take 3 mLs (2.5 mg total) by nebulization every 6 (six) hours as needed for wheezing or shortness of breath. 75 mL 5   albuterol (VENTOLIN HFA) 108 (90 Base) MCG/ACT inhaler Inhale 2 puffs into the lungs every 6 (six) hours as needed for wheezing or shortness of breath. 8.5 g 3   ALPRAZolam (XANAX) 0.5 MG tablet 1 tab by mouth twice per day as needed 60 tablet 2   aspirin 81 MG tablet Take 1 tablet (81 mg total) by mouth daily. 100 tablet 99   bisoprolol (ZEBETA) 5 MG tablet Take 0.5 tablets (2.5 mg total) by mouth daily. 45 tablet 3   Budeson-Glycopyrrol-Formoterol (BREZTRI AEROSPHERE) 160-9-4.8 MCG/ACT AERO Inhale 2 puffs into the lungs 2 (two) times daily. 32.1 g 3   chlorpheniramine-HYDROcodone (TUSSIONEX) 10-8 MG/5ML Take 5 mLs by mouth every 12 (twelve) hours as needed for cough. 115 mL 0   digoxin (LANOXIN) 0.125 MG tablet Take 0.5 tablets (62.5 mcg total) by mouth daily. 45 tablet 3   furosemide (LASIX) 20 MG tablet Take 1 tablet (20 mg total) by mouth daily as needed. 90 tablet 1   HYDROcodone-acetaminophen (NORCO/VICODIN) 5-325 MG tablet Take 1 tablet by mouth every 6 (six) hours as needed for moderate pain. 30 tablet 0   ibuprofen (ADVIL,MOTRIN) 200 MG tablet Take 200 mg by  mouth daily as needed for headache.     methylPREDNISolone (MEDROL DOSEPAK) 4 MG TBPK tablet Day 1: Take 2 tablets at breakfast, 1 at lunch, 1 at supper and 2 at bedtime Day 2: Take 1 tablet at breakfast, 1 at lunch, 1 at supper, and 2 at bedtime Day 3: Take 1 at breakfast, 1 at lunch, 1 at supper and 1 at bedtime Day 4: Take 1 at breakfast, lunch and bedtime Day 5: Take 1 at breakfast and bedtime Day 6: Take 1 at breakfast 21 tablet 0   rosuvastatin (CRESTOR) 10 MG tablet Take 1 tablet (10 mg total) by mouth daily. 90 tablet 0   sacubitril-valsartan (ENTRESTO) 24-26 MG Take 0.5 tablets by mouth 2 (two) times daily.     spironolactone (ALDACTONE) 25 MG tablet Take 12.5 mg by mouth at bedtime.     tiZANidine (ZANAFLEX) 2 MG tablet Take 1 tablet (2 mg total) by mouth every 6 (six) hours as needed for  muscle spasms. 40 tablet 1   triamcinolone cream (KENALOG) 0.1 % Apply 1 Application topically 2 (two) times daily. Qd to bid aa left lower leg until clear, avoid face, groin, axilla 80 g 1   No current facility-administered medications for this visit.   There were no vitals taken for this visit.  Wt Readings from Last 3 Encounters:  03/02/22 49 kg (108 lb 0.4 oz)  02/27/22 49.4 kg (109 lb)  12/04/21 49.9 kg (110 lb)   General: NAD Neck: No JVD, no thyromegaly or thyroid nodule.  Lungs: Distant BS CV: Nondisplaced PMI.  Heart regular S1/S2, no S3/S4, no murmur.  No peripheral edema.  No carotid bruit.  Unable to palpate pedal pulses.  Abdomen: Soft, nontender, no hepatosplenomegaly, no distention.  Skin: Intact without lesions or rashes.  Neurologic: Alert and oriented x 3.  Psych: Normal affect. Extremities: No clubbing or cyanosis.  HEENT: Normal.   Assessment/Plan: 1.  PAD: She had endovascular intervention to occluded right CFA in 11/18, but subsequent arterial showed in-stent restenosis. She has occluded left CFA also that was not intervened on. Minimal claudication but she had a  slow-healing wound on her left lower leg. She is still smoking. - I will arrange for peripheral arterial doppler evaluation.  - Continue statin.  - Discussed smoking cessation, she is under a lot stress with husband's illness and not ready to quit.  - Followed by Dr Fletcher Anon.   2. CAD: Nonobstructive on 10/18 cath. She has atypical chest pain at times with stress.  Symptoms are very brief and do not appear to be consistent with angina.  Cardiolite in 11/21 with no evidence of ischemia.  - She is on ASA 81 and statin.  3. Hyperlipidemia: Continue crestor, good lipids in 8/23.  4. COPD: Severe by PFTs on 5/21 CPX. COPD plays a significant role in her dyspnea. She is still smoking, not ready to quit (see above).   5. Chronic systolic CHF: Nonischemic cardiomyopathy.  RHC/LHC in 10/18 with no nonobstructive coronary disease but CI was very low at 1.33.  Her low cardiac output was out of proportion to her symptoms.  [name] CRT-D device.  CPX in 5/21 showed severe COPD but only mild-moderate HF limitation.   Echo with EF stable at 35-40% in 9/22 and EF 40% on today's echo.  Weight stable, not volume overloaded on exam or by Heartlogic. NYHA class III symptoms likely more to do with COPD than CHF.     - Takes Lasix prn.   - Increase Entresto to 24/26 bid, she has been cutting the tabs in half.  BMET/BNP today and BMET in 10 days.  - Continue bisoprolol 2.5 mg daily.  - Continue spironolactone 12.5 mg daily. Unable to tolerate higher doses.Take qhs.  - Continue digoxin, check level today.  - Unable to tolerate [place].   Followup 3 months with APP  [place] 03/10/2022

## 2022-03-11 ENCOUNTER — Encounter (HOSPITAL_COMMUNITY): Payer: Medicare Other

## 2022-03-19 ENCOUNTER — Ambulatory Visit: Payer: Medicare Other

## 2022-03-19 DIAGNOSIS — I420 Dilated cardiomyopathy: Secondary | ICD-10-CM | POA: Diagnosis not present

## 2022-03-20 ENCOUNTER — Telehealth: Payer: Self-pay

## 2022-03-20 LAB — CUP PACEART REMOTE DEVICE CHECK
Battery Remaining Longevity: 78 mo
Battery Remaining Percentage: 86 %
Brady Statistic RA Percent Paced: 42 %
Brady Statistic RV Percent Paced: 89 %
Date Time Interrogation Session: 20240226041200
HighPow Impedance: 79 Ohm
Implantable Lead Connection Status: 753985
Implantable Lead Connection Status: 753985
Implantable Lead Connection Status: 753985
Implantable Lead Implant Date: 20190226
Implantable Lead Implant Date: 20190226
Implantable Lead Implant Date: 20190226
Implantable Lead Location: 753858
Implantable Lead Location: 753859
Implantable Lead Location: 753860
Implantable Lead Model: 292
Implantable Lead Model: 4671
Implantable Lead Model: 7740
Implantable Lead Serial Number: 444536
Implantable Lead Serial Number: 726363
Implantable Lead Serial Number: 805938
Implantable Pulse Generator Implant Date: 20190226
Lead Channel Impedance Value: 450 Ohm
Lead Channel Impedance Value: 548 Ohm
Lead Channel Impedance Value: 844 Ohm
Lead Channel Setting Pacing Amplitude: 2 V
Lead Channel Setting Pacing Amplitude: 2.5 V
Lead Channel Setting Pacing Amplitude: 2.6 V
Lead Channel Setting Pacing Pulse Width: 0.4 ms
Lead Channel Setting Pacing Pulse Width: 0.4 ms
Lead Channel Setting Sensing Sensitivity: 0.5 mV
Lead Channel Setting Sensing Sensitivity: 1 mV
Pulse Gen Serial Number: 204176
Zone Setting Status: 755011

## 2022-03-20 MED ORDER — MOMETASONE FUROATE 0.1 % EX CREA: 1.0000 | TOPICAL_CREAM | Freq: Every day | CUTANEOUS | 0 refills | Status: DC | PRN

## 2022-03-20 NOTE — Telephone Encounter (Signed)
Patient advised of information per Dr. Nehemiah Massed and RX sent in. aw

## 2022-03-20 NOTE — Telephone Encounter (Signed)
Patient went to the ER at the beginning of the month and was diagnoses with shingles. Patient called the office 03/03/22 and was prescribed Gabapentin due to pain. Patient left voicemail on nurse line that areas are drying up and coming off but she still has some stinging and pain with the dry patches. She is applying Cerave moisturizer but having no relief. Patient is asking for any suggestions of something else she could apply.

## 2022-03-26 ENCOUNTER — Telehealth: Payer: Self-pay | Admitting: *Deleted

## 2022-03-26 NOTE — Progress Notes (Signed)
  Care Coordination Note  03/26/2022 Name: [name] MRN: [id number] DOB: 1945/02/07  Kimberly [name] is a 77 y.o. year old female who is a primary care patient of Biagio Borg, MD and is actively engaged with the care management team. I reached out to Kimberly [name] by phone today to assist with re-scheduling a follow up visit with the Licensed Clinical Social Worker  Follow up plan: Unsuccessful telephone outreach attempt made. A HIPAA compliant phone message was left for the patient providing contact information and requesting a return call.   Julian Hy, [place] Direct Dial: [phone]

## 2022-04-01 ENCOUNTER — Encounter (HOSPITAL_COMMUNITY): Payer: Medicare Other

## 2022-04-04 ENCOUNTER — Telehealth: Payer: Self-pay | Admitting: Internal Medicine

## 2022-04-04 MED ORDER — HYDROCODONE-ACETAMINOPHEN 5-325 MG PO TABS: 1.0000 | ORAL_TABLET | Freq: Four times a day (QID) | ORAL | 0 refills | Status: DC | PRN

## 2022-04-04 NOTE — Telephone Encounter (Signed)
Patient has not completed her hydrocodone rx yet but she is still in a lot of pain and wants to know if you can call her in another rx of this because she is still in a lot of pain from the shingles.  Please advise  Patient's number:  [phone]

## 2022-04-04 NOTE — Telephone Encounter (Signed)
Please advise 

## 2022-04-04 NOTE — Telephone Encounter (Signed)
Done erx 

## 2022-04-08 NOTE — Progress Notes (Signed)
  Care Coordination Note  04/08/2022 Name: [name] MRN: [id number] DOB: 08/12/1945  Kimberly [name] is a 77 y.o. year old female who is a primary care patient of Biagio Borg, MD and is actively engaged with the care management team. I reached out to Kimberly [name] by phone today to assist with re-scheduling a follow up visit with the Licensed Clinical Social Worker  Follow up plan: We have been unable to make contact with the patient for follow up.   Julian Hy, [place] Direct Dial: [phone]

## 2022-04-16 ENCOUNTER — Encounter: Payer: Self-pay | Admitting: Internal Medicine

## 2022-04-17 ENCOUNTER — Telehealth (INDEPENDENT_AMBULATORY_CARE_PROVIDER_SITE_OTHER): Payer: Medicare Other | Admitting: Internal Medicine

## 2022-04-17 DIAGNOSIS — R739 Hyperglycemia, unspecified: Secondary | ICD-10-CM

## 2022-04-17 DIAGNOSIS — I10 Essential (primary) hypertension: Secondary | ICD-10-CM | POA: Diagnosis not present

## 2022-04-17 DIAGNOSIS — J441 Chronic obstructive pulmonary disease with (acute) exacerbation: Secondary | ICD-10-CM

## 2022-04-17 MED ORDER — LEVOFLOXACIN 500 MG PO TABS: 500.0000 mg | ORAL_TABLET | Freq: Every day | ORAL | 0 refills | Status: AC

## 2022-04-17 MED ORDER — PREDNISONE 10 MG PO TABS: ORAL_TABLET | ORAL | 0 refills | Status: DC

## 2022-04-17 NOTE — Progress Notes (Signed)
Patient ID: Kimberly [name], female   DOB: 1945-07-11, 77 y.o.   MRN: [id number]  Virtual Visit via Video Note  I connected with [name] on Apr 19, 2022 at  3:20 PM EDT by a video enabled telemedicine application and verified that I am speaking with the correct person using two identifiers.  Location of all participants today Patient: at home Provider: at office   I discussed the limitations of evaluation and management by telemedicine and the availability of in person appointments. The patient expressed understanding and agreed to proceed.  History of Present Illness: Here with acute onset mild to mod 5 days ST, HA, general weakness and malaise, with prod cough greenish sputum, but Pt denies chest pain, increased sob or doe, wheezing, orthopnea, PND, increased LE swelling, palpitations, dizziness or syncope. Except for onset wheezing, sob x 2 days despite increased nebulizer use.   Pt denies polydipsia, polyuria, or new focal neuro s/s.    Past Medical History:  Diagnosis Date   AICD (automatic cardioverter/defibrillator) present 03/17/2017   Anxiety    Atrial tachycardia    Basal cell carcinoma    R ant thigh, L pretibia, both txted at [facility]   Bursitis of shoulder, right, adhesive    Cancer ([place])    CHF (congestive heart failure) ([place])    Chronic bronchitis ([place])    "1-2 times/yr" (01/23/2014)   COPD (chronic obstructive pulmonary disease) ([facility])    CVD (cerebrovascular disease)    Dyslipidemia    Dysrhythmia    Frequency of urination    GERD (gastroesophageal reflux disease)    Heart murmur    History of stomach ulcers    HTN (hypertension) 02/22/2011   Migraines    "stopped many years ago" (06/14/2014)   Osteoporosis 08/19/2016   Pericarditis    Pneumonia "10 times" (06/14/2014)   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)    Silent myocardial infarction [facility]) "late 1990's"   Stress incontinence    "was suppose to have been  tacked up years ago but I didn't do it"   Syncope, near    Associated with atrial tachycardia-event recorder 1/16   Thoracic outlet syndrome    VSD (ventricular septal defect)    Past Surgical History:  Procedure Laterality Date   ABDOMINAL AORTOGRAM W/LOWER EXTREMITY N/A 12/17/2016   Procedure: ABDOMINAL AORTOGRAM W/LOWER EXTREMITY;  Surgeon: Wellington Hampshire, MD;  Location: [facility];  Service: Cardiovascular;  Laterality: N/A;   BIV ICD INSERTION CRT-D N/A 03/17/2017   Procedure: BIV ICD INSERTION CRT-D;  Surgeon: Evans Lance, MD;  Location: [place] [facility];  Service: Cardiovascular;  Laterality: N/A;   CARDIAC CATHETERIZATION  "quite a few"   [place]  1970's   CORONARY ANGIOGRAM  2000   No significant CAD   ELECTROPHYSIOLOGIC STUDY N/A 06/14/2014   Procedure: A-Flutter/A-Tach/SVT Ablation;  Surgeon: Evans Lance, MD;  Location: [place] [facility];  Service: Cardiovascular;  Laterality: N/A;   INSERTION OF ICD  03/17/2017   BIV   MULTIPLE TOOTH EXTRACTIONS     MYRINGOTOMY WITH TUBE PLACEMENT Right 2015   PERIPHERAL VASCULAR INTERVENTION  12/17/2016   Procedure: PERIPHERAL VASCULAR INTERVENTION;  Surgeon: Wellington Hampshire, MD;  Location: [place] [facility];  Service: Cardiovascular;;  Right common femoral PTA and Stent   RIGHT/LEFT HEART CATH AND CORONARY ANGIOGRAPHY Bilateral 11/06/2016   Procedure: RIGHT/LEFT HEART CATH AND CORONARY ANGIOGRAPHY;  Surgeon:  Wellington Hampshire, MD;  Location: [place] [facility];  Service: Cardiovascular;  Laterality: Bilateral;   SVT ABLATION  06/14/2014   TUBAL LIGATION  1972   VSD REPAIR  1958; 1967    reports that she has been smoking cigarettes. She has a 17.50 pack-year smoking history. She has never used smokeless tobacco. She reports that she does not currently use alcohol after a past usage of about 1.0 standard drink of alcohol per week. She reports that she does not use  drugs. family history includes Alcohol abuse in an other family member; Arthritis in some other family members; Cancer in her father, mother, and another family member; Hypertension in some other family members; Stroke in some other family members; Throat cancer in an other family member. Allergies  Allergen Reactions   Mupirocin Shortness Of Breath and Other (See Comments)    Burning, pain, swelling and sob   Codeine Nausea Only   Current Outpatient Medications on File Prior to Visit  Medication Sig Dispense Refill   mometasone (ELOCON) 0.1 % cream Apply 1 Application topically daily as needed (Rash). 45 g 0   albuterol (PROVENTIL) (2.5 MG/3ML) 0.083% nebulizer solution Take 3 mLs (2.5 mg total) by nebulization every 6 (six) hours as needed for wheezing or shortness of breath. 75 mL 5   albuterol (VENTOLIN HFA) 108 (90 Base) MCG/ACT inhaler Inhale 2 puffs into the lungs every 6 (six) hours as needed for wheezing or shortness of breath. 8.5 g 3   ALPRAZolam (XANAX) 0.5 MG tablet 1 tab by mouth twice per day as needed 60 tablet 2   aspirin 81 MG tablet Take 1 tablet (81 mg total) by mouth daily. 100 tablet 99   bisoprolol (ZEBETA) 5 MG tablet Take 0.5 tablets (2.5 mg total) by mouth daily. 45 tablet 3   Budeson-Glycopyrrol-Formoterol (BREZTRI AEROSPHERE) 160-9-4.8 MCG/ACT AERO Inhale 2 puffs into the lungs 2 (two) times daily. 32.1 g 3   chlorpheniramine-HYDROcodone (TUSSIONEX) 10-8 MG/5ML Take 5 mLs by mouth every 12 (twelve) hours as needed for cough. 115 mL 0   digoxin (LANOXIN) 0.125 MG tablet Take 0.5 tablets (62.5 mcg total) by mouth daily. 45 tablet 3   furosemide (LASIX) 20 MG tablet Take 1 tablet (20 mg total) by mouth daily as needed. 90 tablet 1   gabapentin (NEURONTIN) 300 MG capsule Take 1 capsule (300 mg total) by mouth as directed. Take 1 po up to tid, start out taking 1 po qpm, if not improving may increase to 1 po bid, if still not improving increase to 1 po tid, may make drowsy,  do not drive while taking this medication 90 capsule 0   HYDROcodone-acetaminophen (NORCO/VICODIN) 5-325 MG tablet Take 1 tablet by mouth every 6 (six) hours as needed for moderate pain. 30 tablet 0   ibuprofen (ADVIL,MOTRIN) 200 MG tablet Take 200 mg by mouth daily as needed for headache.     methylPREDNISolone (MEDROL DOSEPAK) 4 MG TBPK tablet Day 1: Take 2 tablets at breakfast, 1 at lunch, 1 at supper and 2 at bedtime Day 2: Take 1 tablet at breakfast, 1 at lunch, 1 at supper, and 2 at bedtime Day 3: Take 1 at breakfast, 1 at lunch, 1 at supper and 1 at bedtime Day 4: Take 1 at breakfast, lunch and bedtime Day 5: Take 1 at breakfast and bedtime Day 6: Take 1 at breakfast 21 tablet 0   rosuvastatin (CRESTOR) 10 MG tablet Take 1 tablet (10 mg total) by mouth daily.  90 tablet 0   sacubitril-valsartan (ENTRESTO) 24-26 MG Take 0.5 tablets by mouth 2 (two) times daily.     spironolactone (ALDACTONE) 25 MG tablet Take 12.5 mg by mouth at bedtime.     tiZANidine (ZANAFLEX) 2 MG tablet Take 1 tablet (2 mg total) by mouth every 6 (six) hours as needed for muscle spasms. 40 tablet 1   triamcinolone cream (KENALOG) 0.1 % Apply 1 Application topically 2 (two) times daily. Qd to bid aa left lower leg until clear, avoid face, groin, axilla 80 g 1   No current facility-administered medications on file prior to visit.   Observations/Objective: Alert, NAD, appropriate mood and affect, resps sightly increased with mild breathlessness on speaking, no accessory muscle use,, cn 2-12 intact, moves all 4s, no visible rash or swelling Lab Results  Component Value Date   WBC 7.4 10/31/2021   HGB 11.8 (L) 10/31/2021   HCT 35.1 (L) 10/31/2021   PLT 251.0 10/31/2021   GLUCOSE 94 12/04/2021   CHOL 169 09/06/2021   TRIG 49 09/06/2021   HDL 91 09/06/2021   LDLDIRECT 112.6 03/16/2013   LDLCALC 68 09/06/2021   ALT 7 10/31/2021   AST 12 10/31/2021   NA 139 12/04/2021   K 4.3 12/04/2021   CL 103 12/04/2021    CREATININE 0.99 12/04/2021   BUN 16 12/04/2021   CO2 27 12/04/2021   TSH 0.87 04/16/2021   INR 1.02 12/15/2016   HGBA1C 6.3 04/16/2021   Assessment and Plan: See notes  Follow Up Instructions: See notes   I discussed the assessment and treatment plan with the patient. The patient was provided an opportunity to ask questions and all were answered. The patient agreed with the plan and demonstrated an understanding of the instructions.   The patient was advised to call back or seek an in-person evaluation if the symptoms worsen or if the condition fails to improve as anticipated.   Cathlean Cower, MD

## 2022-04-17 NOTE — Telephone Encounter (Signed)
Ok for phone visit

## 2022-04-17 NOTE — Telephone Encounter (Signed)
Needs OV.  

## 2022-04-17 NOTE — Telephone Encounter (Signed)
OV offered but patient declined and is requesting virtual

## 2022-04-19 ENCOUNTER — Encounter: Payer: Self-pay | Admitting: Internal Medicine

## 2022-04-19 NOTE — Assessment & Plan Note (Signed)
BP Readings from Last 3 Encounters:  03/02/22 108/67  02/27/22 100/60  01/06/22 96/62   Stable, pt to continue medical treatment zebeta 2.5 qd, entresto 24-26 qd

## 2022-04-19 NOTE — Patient Instructions (Signed)
Please take all new medication as prescribed 

## 2022-04-19 NOTE — Assessment & Plan Note (Signed)
Lab Results  Component Value Date   HGBA1C 6.3 04/16/2021   Stable, pt to continue current medical treatment  - diet, wt control

## 2022-04-19 NOTE — Assessment & Plan Note (Addendum)
Mild to mod, for antibx course levaquin 500 qd, medrol taper, has cough med prn and nebs at home to continue as well,,  to f/u any worsening symptoms or concerns

## 2022-04-21 ENCOUNTER — Telehealth: Payer: Self-pay | Admitting: Internal Medicine

## 2022-04-21 ENCOUNTER — Encounter: Payer: Self-pay | Admitting: Internal Medicine

## 2022-04-21 DIAGNOSIS — J441 Chronic obstructive pulmonary disease with (acute) exacerbation: Secondary | ICD-10-CM

## 2022-04-21 NOTE — Telephone Encounter (Signed)
Please consider OV in person, or UC or ED if not improving, as there is little else that would be better than this tx, and other issues may be present

## 2022-04-21 NOTE — Telephone Encounter (Signed)
Patient was seen 04/17/22 and was prescribed  levofloxacin (LEVAQUIN) 500 MG tablet and predniSONE (DELTASONE) 10 MG tablet . She said they're not helping and she would like to know what else she can do. She would like a call back at [phone].

## 2022-04-21 NOTE — Progress Notes (Signed)
Remote ICD transmission.   

## 2022-04-22 NOTE — Telephone Encounter (Signed)
Spoken with pt and she said she feels a little better today but wants to give it a couple more days and will let us know how she feels.

## 2022-04-22 NOTE — Telephone Encounter (Signed)
PT calls today following up on the last message sent. She stated their was an error in the location she suggested. She would like this sent to the [facility] in [place]. She stated she wouldn't be able to make it out to [facility] in time as their x-ray department closes at 2:00 and she won't have transportation until past that point.

## 2022-04-22 NOTE — Telephone Encounter (Signed)
Sorry, this is not an option for me to do (ordering the cxr there).  She would need to go in person for an evaluation at that facility

## 2022-04-23 NOTE — Telephone Encounter (Signed)
Called pt gave her MD response. Pt states she will go ahead to the hospital for evaluation. She states she feel like she did yesterday. Inform pt will let MD know.Marland KitchenJohny Chess

## 2022-04-24 ENCOUNTER — Emergency Department: Payer: Medicare Other

## 2022-04-24 ENCOUNTER — Encounter: Payer: Self-pay | Admitting: Intensive Care

## 2022-04-24 ENCOUNTER — Other Ambulatory Visit: Payer: Self-pay

## 2022-04-24 ENCOUNTER — Emergency Department
Admission: EM | Admit: 2022-04-24 | Discharge: 2022-04-24 | Disposition: A | Discharge status: Home / Self Care | Payer: Medicare Other | Attending: Emergency Medicine | Admitting: Emergency Medicine

## 2022-04-24 DIAGNOSIS — R079 Chest pain, unspecified: Secondary | ICD-10-CM | POA: Diagnosis not present

## 2022-04-24 DIAGNOSIS — F17210 Nicotine dependence, cigarettes, uncomplicated: Secondary | ICD-10-CM | POA: Insufficient documentation

## 2022-04-24 DIAGNOSIS — F439 Reaction to severe stress, unspecified: Secondary | ICD-10-CM | POA: Insufficient documentation

## 2022-04-24 DIAGNOSIS — R0602 Shortness of breath: Secondary | ICD-10-CM | POA: Diagnosis not present

## 2022-04-24 DIAGNOSIS — J441 Chronic obstructive pulmonary disease with (acute) exacerbation: Secondary | ICD-10-CM

## 2022-04-24 LAB — BASIC METABOLIC PANEL
Anion gap: 8 (ref 5–15)
BUN: 25 mg/dL — ABNORMAL HIGH (ref 8–23)
CO2: 30 mmol/L (ref 22–32)
Calcium: 9.2 mg/dL (ref 8.9–10.3)
Chloride: 100 mmol/L (ref 98–111)
Creatinine, Ser: 1.14 mg/dL — ABNORMAL HIGH (ref 0.44–1.00)
GFR, Estimated: 50 mL/min — ABNORMAL LOW (ref >60)
Glucose, Bld: 118 mg/dL — ABNORMAL HIGH (ref 70–99)
Potassium: 4.2 mmol/L (ref 3.5–5.1)
Sodium: 138 mmol/L (ref 135–145)

## 2022-04-24 LAB — CBC
HCT: 44.9 % (ref 36.0–46.0)
Hemoglobin: 14.1 g/dL (ref 12.0–15.0)
MCH: 29.6 pg (ref 26.0–34.0)
MCHC: 31.4 g/dL (ref 30.0–36.0)
MCV: 94.3 fL (ref 80.0–100.0)
Platelets: 308 10*3/uL (ref 150–400)
RBC: 4.76 MIL/uL (ref 3.87–5.11)
RDW: 15.1 % (ref 11.5–15.5)
WBC: 10.7 10*3/uL — ABNORMAL HIGH (ref 4.0–10.5)
nRBC: 0 % (ref 0.0–0.2)

## 2022-04-24 LAB — TROPONIN I (HIGH SENSITIVITY): Troponin I (High Sensitivity): 12 ng/L (ref <18)

## 2022-04-24 MED ORDER — MAGNESIUM SULFATE 2 GM/50ML IV SOLN
2.0000 g | Freq: Once | INTRAVENOUS | Status: AC
  Administered 2022-04-24: 2 g via INTRAVENOUS
  Filled 2022-04-24: qty 50

## 2022-04-24 MED ORDER — PREDNISONE 50 MG PO TABS: 50.0000 mg | ORAL_TABLET | Freq: Every day | ORAL | 0 refills | Status: DC

## 2022-04-24 MED ORDER — IPRATROPIUM-ALBUTEROL 0.5-2.5 (3) MG/3ML IN SOLN
3.0000 mL | Freq: Once | RESPIRATORY_TRACT | Status: AC
  Administered 2022-04-24: 3 mL via RESPIRATORY_TRACT
  Filled 2022-04-24: qty 3

## 2022-04-24 MED ORDER — METHYLPREDNISOLONE SODIUM SUCC 125 MG IJ SOLR
125.0000 mg | Freq: Once | INTRAMUSCULAR | Status: AC
  Administered 2022-04-24: 125 mg via INTRAVENOUS
  Filled 2022-04-24: qty 2

## 2022-04-24 NOTE — ED Triage Notes (Signed)
Patient c/o chest pressure and SOB.  Reports she was sick with respiratory symptoms and now experiencing cp and sob. Has been taking prednisone and antibiotics for a few days with no relief.   Hx COPD, CHF, hypertension

## 2022-04-24 NOTE — ED Notes (Signed)
EDP Kinner to bedside to reassess and update pt.

## 2022-04-24 NOTE — ED Provider Notes (Signed)
[facility] Provider Note    Event Date/Time   First MD Initiated Contact with Patient 04/24/22 1143     (approximate)   History   Shortness of Breath and Chest Pain   HPI  Kimberly [name] is a 77 y.o. female with history of COPD who presents with shortness of breath and chest tightness.  Patient reports she has been treated for a COPD exacerbation over the last several days and has been on prednisone taper and has used occasional nebulizers at home.  She is not on home oxygen.  She does report increased stress and continues to smoke cigarettes.     Physical Exam   Triage Vital Signs: ED Triage Vitals  Enc Vitals Group     BP 04/24/22 1052 104/87     Pulse Rate 04/24/22 1052 66     Resp 04/24/22 1052 (!) 22     Temp 04/24/22 1052 98.3 F (36.8 C)     Temp Source 04/24/22 1052 Oral     SpO2 04/24/22 1052 96 %     Weight 04/24/22 1053 46.7 kg (103 lb)     Height 04/24/22 1053 1.549 m (5\' 1" )     Head Circumference --      Peak Flow --      Pain Score 04/24/22 1053 6     Pain Loc --      Pain Edu? --      Excl. in [place]? --     Most recent vital signs: Vitals:   04/24/22 1325 04/24/22 1335  BP:  (!) 102/50  Pulse:  61  Resp: 18 18  Temp:    SpO2: 98% 98%     General: Awake, no distress.  CV:  Good peripheral perfusion.  Resp:  Normal effort.  Moderate airflow, scattered wheezing Abd:  No distention.  Other:     ED Results / Procedures / Treatments   Labs (all labs ordered are listed, but only abnormal results are displayed) Labs Reviewed  BASIC METABOLIC PANEL - Abnormal; Notable for the following components:      Result Value   Glucose, Bld 118 (*)    BUN 25 (*)    Creatinine, Ser 1.14 (*)    GFR, Estimated 50 (*)    All other components within normal limits  CBC - Abnormal; Notable for the following components:   WBC 10.7 (*)    All other components within normal limits  TROPONIN I (HIGH SENSITIVITY)     EKG  ED ECG  REPORT I, Lavonia Drafts, the attending physician, personally viewed and interpreted this ECG.  Date: 04/24/2022  Rhythm: AV paced rhythm QRS Axis: normal Intervals: normal ST/T Wave abnormalities: normal Narrative Interpretation: Paced rhythm    RADIOLOGY Chest x-ray viewed interpreted by me, no acute abnormality    PROCEDURES:  Critical Care performed:   Procedures   MEDICATIONS ORDERED IN ED: Medications  methylPREDNISolone sodium succinate (SOLU-MEDROL) 125 mg/2 mL injection 125 mg (125 mg Intravenous Given 04/24/22 1236)  magnesium sulfate IVPB 2 g 50 mL (0 g Intravenous Stopped 04/24/22 1311)  ipratropium-albuterol (DUONEB) 0.5-2.5 (3) MG/3ML nebulizer solution 3 mL (3 mLs Nebulization Given 04/24/22 1243)  ipratropium-albuterol (DUONEB) 0.5-2.5 (3) MG/3ML nebulizer solution 3 mL (3 mLs Nebulization Given 04/24/22 1243)  ipratropium-albuterol (DUONEB) 0.5-2.5 (3) MG/3ML nebulizer solution 3 mL (3 mLs Nebulization Given 04/24/22 1333)     IMPRESSION / MDM / [place] / ED COURSE  I reviewed the triage vital  signs and the nursing notes. Patient's presentation is most consistent with exacerbation of chronic illness.  Patient presents with increased ornis of breath chest tightness as above.  Differential includes pneumonia, COPD exacerbation, edema  Overall exam is most consistent with COPD exacerbation.  Chest x-ray without evidence of pneumonia, patient is afebrile, lab work is reassuring  Will treat with IV Solu-Medrol, DuoNebs, magnesium and reevaluate  Patient feeling much better after treatment, will give additional DuoNeb she would like to be discharged home and would prefer to avoid admission although I did offer that as a possibility but she feels significantly improved and notes that she can return if any worsening  Will give prednisone burst, continue nebs at home, strict return precautions        FINAL CLINICAL IMPRESSION(S) / ED DIAGNOSES   Final  diagnoses:  COPD exacerbation     Rx / DC Orders   ED Discharge Orders          Ordered    predniSONE (DELTASONE) 50 MG tablet  Daily with breakfast        04/24/22 1328             Note:  This document was prepared using Dragon voice recognition software and may include unintentional dictation errors.   Lavonia Drafts, MD 04/24/22 1340

## 2022-04-24 NOTE — ED Notes (Signed)
See triage note. Pt in with SOB and inc WOB upon exertion; pt's skin dry, resp reg/unlabored currently, pt calmly sitting on stretcher; reports long cardiac hx.

## 2022-04-25 ENCOUNTER — Telehealth: Payer: Self-pay | Admitting: Internal Medicine

## 2022-04-25 NOTE — Telephone Encounter (Signed)
That is a rather high dose; if she cannot take the 50 mg, at least take HALF the tablets for longer and just use them up eventually

## 2022-04-25 NOTE — Telephone Encounter (Signed)
Patient called and said she went to [facility] and they prescribed predniSONE (DELTASONE) 50 MG tablet . She said it is making her feel very jittery and she would like advise about what to do. Best call back is [phone].

## 2022-04-27 ENCOUNTER — Encounter: Payer: Self-pay | Admitting: Internal Medicine

## 2022-04-28 ENCOUNTER — Telehealth: Payer: Self-pay

## 2022-04-28 NOTE — Telephone Encounter (Signed)
     Patient  visit on 04/24/2022  at [facility] was for shortness of breath, chest pain.  Have you been able to follow up with your primary care physician? Yes  The patient was or was not able to obtain any needed medicine or equipment. Patient was able to obtain medication.  Are there diet recommendations that you are having difficulty following? No  Patient expresses understanding of discharge instructions and education provided has no other needs at this time. Patient stated she was not given a copy of discharge instructions but was able to access them on MyChart.   Kimberly [name] [facility]  [facility] Population Health Community Resource Care Guide   [email or web address]  ?? [phone]   Website: [email or web address]  [email or web address]

## 2022-04-28 NOTE — Telephone Encounter (Signed)
Spoke w/ pt this am. See previous msg../lmb

## 2022-04-28 NOTE — Telephone Encounter (Signed)
Notified pt w/MD response.../lmb 

## 2022-04-30 ENCOUNTER — Telehealth: Payer: Self-pay

## 2022-04-30 NOTE — Telephone Encounter (Signed)
Patient called in stating she has been taking prednisone and it makes her heart race. She has reached out to MD Jonny Ruiz and he told her to break them in half making it 25mg . The patient states it still does the same thing so she is deciding not to take it. Patient just wants to make GT aware

## 2022-05-05 ENCOUNTER — Ambulatory Visit (HOSPITAL_BASED_OUTPATIENT_CLINIC_OR_DEPARTMENT_OTHER): Payer: Medicare Other | Admitting: Cardiology

## 2022-05-05 ENCOUNTER — Other Ambulatory Visit (HOSPITAL_COMMUNITY): Payer: Self-pay

## 2022-05-05 ENCOUNTER — Encounter: Payer: Medicare Other | Admitting: Cardiology

## 2022-05-05 ENCOUNTER — Other Ambulatory Visit
Admission: RE | Admit: 2022-05-05 | Discharge: 2022-05-05 | Disposition: A | Discharge status: Home / Self Care | Payer: Medicare Other | Source: Ambulatory Visit | Attending: Cardiology | Admitting: Cardiology

## 2022-05-05 ENCOUNTER — Encounter: Payer: Self-pay | Admitting: Cardiology

## 2022-05-05 VITALS — BP 110/47 | HR 59 | Resp 18 | Wt 103.2 lb

## 2022-05-05 DIAGNOSIS — I5022 Chronic systolic (congestive) heart failure: Secondary | ICD-10-CM | POA: Insufficient documentation

## 2022-05-05 LAB — BRAIN NATRIURETIC PEPTIDE: B Natriuretic Peptide: 363 pg/mL — ABNORMAL HIGH (ref 0.0–100.0)

## 2022-05-05 LAB — BASIC METABOLIC PANEL
Anion gap: 7 (ref 5–15)
BUN: 20 mg/dL (ref 8–23)
CO2: 30 mmol/L (ref 22–32)
Calcium: 8.5 mg/dL — ABNORMAL LOW (ref 8.9–10.3)
Chloride: 102 mmol/L (ref 98–111)
Creatinine, Ser: 1.03 mg/dL — ABNORMAL HIGH (ref 0.44–1.00)
GFR, Estimated: 57 mL/min — ABNORMAL LOW (ref >60)
Glucose, Bld: 84 mg/dL (ref 70–99)
Potassium: 4.2 mmol/L (ref 3.5–5.1)
Sodium: 139 mmol/L (ref 135–145)

## 2022-05-05 LAB — LIPID PANEL
Cholesterol: 173 mg/dL (ref 0–200)
HDL: 89 mg/dL (ref >40)
LDL Cholesterol: 66 mg/dL (ref 0–99)
Total CHOL/HDL Ratio: 1.9 RATIO
Triglycerides: 92 mg/dL (ref <150)
VLDL: 18 mg/dL (ref 0–40)

## 2022-05-05 LAB — DIGOXIN LEVEL: Digoxin Level: 0.4 ng/mL — ABNORMAL LOW (ref 0.8–2.0)

## 2022-05-05 MED ORDER — FLUTICASONE PROPIONATE HFA 110 MCG/ACT IN AERO: 1.0000 | INHALATION_SPRAY | Freq: Two times a day (BID) | RESPIRATORY_TRACT | 12 refills | Status: DC

## 2022-05-05 MED ORDER — DOXYCYCLINE HYCLATE 100 MG PO CAPS: ORAL_CAPSULE | ORAL | 0 refills | Status: DC

## 2022-05-05 MED ORDER — ARNUITY ELLIPTA 100 MCG/ACT IN AEPB: 100.0000 ug | INHALATION_SPRAY | Freq: Two times a day (BID) | RESPIRATORY_TRACT | 3 refills | Status: DC

## 2022-05-05 MED ORDER — SPIRONOLACTONE 25 MG PO TABS: 25.0000 mg | ORAL_TABLET | Freq: Every evening | ORAL | 3 refills | Status: DC

## 2022-05-05 NOTE — Progress Notes (Signed)
PCP: Dr. Jonny Ruiz Cardiology: Dr. Tenny Craw HF Cardiology: Dr. Shirlee Latch Cardiology/Vascular: Dr Kirke Corin  77 y.o.with history of VSD repair as a child, COPD/active smoking, PAD, and chronic systolic CHF.  Patient has a long history of cardiomyopathy.  She had VSD repaired as a child and imaging has not showed residual VSD.  Cardiac MRI in 11/09 showed EF 36%.  Echo in 12/15 showed EF 40-45%.  Echo in 3/18 showed EF down to 15-20% with moderate RV systolic dysfunction.  RHC/LHC in 10/18 showed nonobstructive coronary but was concerning for low output (CI 1.33).  She had a nonhealing right lower extremity wound.  Peripheral angiogram with bilateral CFA occlusions and underwent DCB/self expanding stent to to right CFA.    CPX in 1/19 showed moderate to severe functional limitation, combination of HF and lung disease, probably more related to the lung disease.  PFTs in 12/18 showed severe COPD.   She had a Environmental [employer] CRT-D device placed.    7/19 peripheral arterial dopplers showed significant in-stent restenosis right CFA stent.  Echo in 4/21 showed EF 35-40% with mild AI.  CPX in 5/21 showed severe COPD, mild-moderate HF limitation.  11/21 Cardiolite showed prior septal and apical infarction, no ischemia.   Had COVID April 2022. She has continued to recover.   Echo in 9/22 showed EF 35-40% with basal-mid septal akinesis and anterior hypokinesis, no evidence for residual VSD, mildly decreased RV systolic function.  Echo in 11/23 showed EF 40% with basal to mid anteroseptal and inferoseptal akinesis, mild RV dysfunction, PASP 33 mmHg.   She returns for followup of CHF. She has finally quit smoking. She was seen in the ER 04/24/22 with COPD exacerbation and started on prednisone. She stopped the prednisone due to tachycardia/palpitations after starting it.  She had stopped Breztri a few months prior for the same reason.  She still has some wheezing and cough though it has improved a bit.  She is short of  breath walking long distances.  No orthopnea/PND.  No chest pain.  Weight down 7 lbs.    Labs (12/22): BNP 418, digoxin 0.5, K 5, creatinine 1.17 Labs (8/23): BNP 222, digoxin 0.7, LDL 68, K 4.4, creatinine 3.73 Labs (10/23): K 3.7, creatinine 0.96 Labs (4/24): hgb 14.1, K 4.2, creatinine 1.14, digoxin level 0.6  [name] CRT-D device interrogation: HeartLogic 1, no AF/VT  ECG (personally reviewed): A-BiV paced  PMH: 1. VSD: s/p repair at [facility] as child.  From description, sounds like muscular VSD.  2. Atrial tachycardia s/p ablation.  3. Hyperlipidemia 4. COPD: Quit smoking 2024 - PFTs (12/18): Severe obstructive lung disease.  5. Atrial fibrillation: Paroxysmal.   Noted only transiently.   6. PAD:  - Angiogram 11/18 showed totally occluded bilateral CFAs.  Patient had stent to right CFA.  ABIs in 12/18 were normal on right. - ABIs (10/20): ABI 0.8 right, 0.74 left - 12/21 dopplers with R CFA stent in-stenosis.  - ABIs (1/24): normal on right, 0.68 on left with occluded left CFA.  7. Chronic systolic CHF: Nonischemic cardiomyopathy.   - cMRI (11/09): EF 36%, VSD patch in anterior ventricular septum - Echo (12/15): EF 40-45% - Echo (3/18): EF 15-20%, moderate LV dilation, moderate MR, moderate RV dilation with moderately decreased systolic function.  - LHC/RHC (10/18): 3+ MR, EF < 25%, minimal nonobstructive CAD; PA 41/21, LVEDP 18, CI 1.33.  - CPX (1/19): peak VO2 13.4, VE/VCO2 slope 43, RER 1.07.  Mod-severe functional limitation due to combination of HF and lung  disease, probably more related to lung disease.  - [name] CRT-D device.  - Echo (7/19): EF 25-30%, mild LV dilation, mild MR, normal RV size and systolic function.  - Echo (4/21): EF 35-40%, mild AI - CPX (5/21): severe COPD, mild-moderate HF limitation.  - Cardiolite (11/21): EF 47%, prior septal and apical infarction, no ischemia. - Echo (9/22): EF 35-40% with basal-mid septal akinesis and anterior  hypokinesis, no evidence for residual VSD, mildly decreased RV systolic function.  - Echo (11/23): EF 40% with basal to mid anteroseptal and inferoseptal akinesis, mild RV dysfunction, PASP 33 mmHg.  8. PVCs: Zio patch 9/19 with 6% PVCs.  9. COVID-19 (4/22)  SH: Smoker, lives in [place], married.  FH: Mother with PAD, sister died at birth from congenital heart disease.   ROS: All systems reviewed and negative except as per HPI.   Current Outpatient Medications  Medication Sig Dispense Refill   albuterol (PROVENTIL) (2.5 MG/3ML) 0.083% nebulizer solution Take 3 mLs (2.5 mg total) by nebulization every 6 (six) hours as needed for wheezing or shortness of breath. 75 mL 5   albuterol (VENTOLIN HFA) 108 (90 Base) MCG/ACT inhaler Inhale 2 puffs into the lungs every 6 (six) hours as needed for wheezing or shortness of breath. 8.5 g 3   ALPRAZolam (XANAX) 0.5 MG tablet 1 tab by mouth twice per day as needed 60 tablet 2   aspirin 81 MG tablet Take 1 tablet (81 mg total) by mouth daily. 100 tablet 99   bisoprolol (ZEBETA) 5 MG tablet Take 0.5 tablets (2.5 mg total) by mouth daily. 45 tablet 3   chlorpheniramine-HYDROcodone (TUSSIONEX) 10-8 MG/5ML Take 5 mLs by mouth every 12 (twelve) hours as needed for cough. 115 mL 0   digoxin (LANOXIN) 0.125 MG tablet Take 0.5 tablets (62.5 mcg total) by mouth daily. 45 tablet 3   doxycycline (VIBRAMYCIN) 100 MG capsule Take 1 tablet by mouth twice daily for 7 days. 14 capsule 0   Fluticasone Furoate (ARNUITY ELLIPTA) 100 MCG/ACT AEPB Inhale 100 mcg into the lungs 2 (two) times daily. 30 each 3   furosemide (LASIX) 20 MG tablet Take 1 tablet (20 mg total) by mouth daily as needed. 90 tablet 1   HYDROcodone-acetaminophen (NORCO/VICODIN) 5-325 MG tablet Take 1 tablet by mouth every 6 (six) hours as needed for moderate pain. 30 tablet 0   ibuprofen (ADVIL,MOTRIN) 200 MG tablet Take 200 mg by mouth daily as needed for headache.     mometasone (ELOCON) 0.1 % cream  Apply 1 Application topically daily as needed (Rash). 45 g 0   rosuvastatin (CRESTOR) 10 MG tablet Take 1 tablet (10 mg total) by mouth daily. 90 tablet 0   sacubitril-valsartan (ENTRESTO) 24-26 MG Take 1 tablet by mouth 2 (two) times daily.     tiZANidine (ZANAFLEX) 2 MG tablet Take 1 tablet (2 mg total) by mouth every 6 (six) hours as needed for muscle spasms. 40 tablet 1   spironolactone (ALDACTONE) 25 MG tablet Take 1 tablet (25 mg total) by mouth at bedtime. 30 tablet 3   No current facility-administered medications for this visit.   BP (!) 110/47 (BP Location: Left Arm, Patient Position: Sitting, Cuff Size: Normal)   Pulse (!) 59   Resp 18   Wt 103 lb 4 oz (46.8 kg)   SpO2 100%   BMI 19.51 kg/m   Wt Readings from Last 3 Encounters:  05/05/22 103 lb 4 oz (46.8 kg)  04/24/22 103 lb (46.7 kg)  03/02/22  108 lb 0.4 oz (49 kg)   General: NAD Neck: No JVD, no thyromegaly or thyroid nodule.  Lungs: Occasional rhonchi and wheezes, distant BS.  CV: Nondisplaced PMI.  Heart regular S1/S2, no S3/S4, 1/6 SEM RUSB  No peripheral edema.  No carotid bruit.  Unable to palpate pedal pulses.  Abdomen: Soft, nontender, no hepatosplenomegaly, no distention.  Skin: Intact without lesions or rashes.  Neurologic: Alert and oriented x 3.  Psych: Normal affect. Extremities: No clubbing or cyanosis.  HEENT: Normal.   Assessment/Plan: 1.  PAD: She had endovascular intervention to occluded right CFA in 11/18, but subsequent arterial showed in-stent restenosis. She has occluded left CFA also that was not intervened on. Minimal claudication.  She has finally quit smoking.  Recently saw Dr. Kirke Corin with plan for medical management unless claudication worsens.  - Continue statin.  - Congratulated on smoking cessation.   2. CAD: Nonobstructive on 10/18 cath. She has atypical chest pain at times with stress.  Symptoms are very brief and do not appear to be consistent with angina.  Cardiolite in 11/21 with no  evidence of ischemia.  - She is on ASA 81 and statin.  3. Hyperlipidemia: Continue crestor, good lipids in 8/23.  4. COPD: Severe by PFTs on 5/21 CPX. COPD plays a significant role in her dyspnea. She has finally quit smoking.  Seen in ER recently with COPD exacerbation, still with wheezing/coughing.  She does not want oral prednisone as it causes palpitations.  She is off her inhaled steroid.  - I will give her inhaled fluticasone and ask her to followup with pulmonary for further adjustment of inhaler regimen.  - I will give her a course of doxycycline 100 mg bid x 7 days given prolonged respiratory symptoms.  5. Chronic systolic CHF: Nonischemic cardiomyopathy.  RHC/LHC in 10/18 with no nonobstructive coronary disease but CI was very low at 1.33.  Her low cardiac output was out of proportion to her symptoms.  [name] CRT-D device.  CPX in 5/21 showed severe COPD but only mild-moderate HF limitation.   Echo with EF stable at 35-40% in 9/22 and EF 40% on 11/23 echo.  Weight lower, not volume overloaded on exam or by Heartlogic. NYHA class III symptoms likely more to do with AECOPD than CHF.     - Takes Lasix prn.   - Continue Entresto 24/26 bid.  - Continue bisoprolol 2.5 mg daily.  - Increase spironolactone to 25 mg qhs.  BMET/BNP today and BMET in 10 days.  - Continue digoxin, check level today.  - Unable to tolerate [place].   Followup 3 months   Kimberly [name] 05/05/2022

## 2022-05-05 NOTE — Patient Instructions (Addendum)
Take doxycycline 100mg  twice daily for 7 days  Use Arnuity inhaler twice a day.  INCREASE Spironolactone to 25mg  daily  Routine lab work today. Will notify you of abnormal results  Repeat labs in 10 days (bmet)  Follow up in 3 months ( We will call you to schedule that appointment)  Do the following things EVERYDAY: Weigh yourself in the morning before breakfast. Write it down and keep it in a log. Take your medicines as prescribed Eat low salt foods--Limit salt (sodium) to 2000 mg per day.  Stay as active as you can everyday Limit all fluids for the day to less than 2 liters

## 2022-05-06 ENCOUNTER — Other Ambulatory Visit: Payer: Self-pay

## 2022-05-06 ENCOUNTER — Other Ambulatory Visit (HOSPITAL_COMMUNITY): Payer: Self-pay

## 2022-05-06 MED ORDER — ARNUITY ELLIPTA 100 MCG/ACT IN AEPB: 100.0000 ug | INHALATION_SPRAY | Freq: Every day | RESPIRATORY_TRACT | 3 refills | Status: DC

## 2022-05-07 ENCOUNTER — Other Ambulatory Visit (HOSPITAL_COMMUNITY): Payer: Self-pay

## 2022-05-22 ENCOUNTER — Other Ambulatory Visit (HOSPITAL_COMMUNITY): Payer: Self-pay | Admitting: Family Medicine

## 2022-05-27 ENCOUNTER — Ambulatory Visit: Payer: Medicare Other | Admitting: Cardiovascular Disease

## 2022-06-02 ENCOUNTER — Other Ambulatory Visit: Payer: Self-pay | Admitting: Cardiovascular Disease

## 2022-06-05 ENCOUNTER — Telehealth: Payer: Self-pay | Admitting: Internal Medicine

## 2022-06-05 DIAGNOSIS — J449 Chronic obstructive pulmonary disease, unspecified: Secondary | ICD-10-CM

## 2022-06-05 DIAGNOSIS — J441 Chronic obstructive pulmonary disease with (acute) exacerbation: Secondary | ICD-10-CM

## 2022-06-05 MED ORDER — ALBUTEROL SULFATE HFA 108 (90 BASE) MCG/ACT IN AERS
2.0000 | INHALATION_SPRAY | Freq: Four times a day (QID) | RESPIRATORY_TRACT | 0 refills | Status: DC | PRN
Start: 2022-06-05 — End: 2022-06-27

## 2022-06-05 MED ORDER — ALBUTEROL SULFATE (2.5 MG/3ML) 0.083% IN NEBU
2.5000 mg | INHALATION_SOLUTION | Freq: Four times a day (QID) | RESPIRATORY_TRACT | 0 refills | Status: DC | PRN
Start: 2022-06-05 — End: 2022-06-27

## 2022-06-05 NOTE — Telephone Encounter (Signed)
Spoke with the pt and notified both albuterol neb sol and inhaler have been sent to her preferred pharm  Nothing further needed

## 2022-06-05 NOTE — Telephone Encounter (Signed)
PT calling for Albuterol Sulfate for nebulizer refill. Pharm is [facility] in [place]  Also need Albuterol inhaler called in as well.  Her # [phone]

## 2022-06-18 ENCOUNTER — Ambulatory Visit (INDEPENDENT_AMBULATORY_CARE_PROVIDER_SITE_OTHER): Payer: Medicare Other

## 2022-06-18 DIAGNOSIS — I420 Dilated cardiomyopathy: Secondary | ICD-10-CM | POA: Diagnosis not present

## 2022-06-18 LAB — CUP PACEART REMOTE DEVICE CHECK
Battery Remaining Longevity: 72 mo
Battery Remaining Percentage: 80 %
Brady Statistic RA Percent Paced: 45 %
Brady Statistic RV Percent Paced: 89 %
Date Time Interrogation Session: 20240529041100
HighPow Impedance: 69 Ohm
Implantable Lead Connection Status: 753985
Implantable Lead Connection Status: 753985
Implantable Lead Connection Status: 753985
Implantable Lead Implant Date: 20190226
Implantable Lead Implant Date: 20190226
Implantable Lead Implant Date: 20190226
Implantable Lead Location: 753858
Implantable Lead Location: 753859
Implantable Lead Location: 753860
Implantable Lead Model: 292
Implantable Lead Model: 4671
Implantable Lead Model: 7740
Implantable Lead Serial Number: 444536
Implantable Lead Serial Number: 726363
Implantable Lead Serial Number: 805938
Implantable Pulse Generator Implant Date: 20190226
Lead Channel Impedance Value: 416 Ohm
Lead Channel Impedance Value: 533 Ohm
Lead Channel Impedance Value: 790 Ohm
Lead Channel Setting Pacing Amplitude: 2 V
Lead Channel Setting Pacing Amplitude: 2.5 V
Lead Channel Setting Pacing Amplitude: 2.6 V
Lead Channel Setting Pacing Pulse Width: 0.4 ms
Lead Channel Setting Pacing Pulse Width: 0.4 ms
Lead Channel Setting Sensing Sensitivity: 0.5 mV
Lead Channel Setting Sensing Sensitivity: 1 mV
Pulse Gen Serial Number: 204176
Zone Setting Status: 755011

## 2022-06-19 ENCOUNTER — Telehealth: Payer: Self-pay

## 2022-06-19 NOTE — Patient Outreach (Signed)
  Care Coordination   Initial Visit Note   06/19/2022 Name: Kimberly [name] MRN: [id number] DOB: 06-14-1945  Kimberly [name] is a 77 y.o. year old female who sees Corwin Levins, MD for primary care. I spoke with  Kimberly [name] by phone today.  What matters to the patients health and wellness today?  Ms. Macapagal reports she does not have time to talk at this time. But request RNCM call her next week, Monday.   Goals Addressed             This Visit's Progress    Care Coordination Activities       Interventions Today    Flowsheet Row Most Recent Value  General Interventions   General Interventions Discussed/Reviewed General Interventions Discussed  [briefly discussed care coordination services. schedule to call patient next week to discuss further.]            SDOH assessments and interventions completed:  No  Care Coordination Interventions:  Yes, provided   Follow up plan: Follow up call scheduled for 06/23/22    Encounter Outcome:  Pt. Visit Completed   Kathyrn Sheriff, RN, MSN, BSN, CCM [facility] Care Coordinator [phone]

## 2022-06-20 ENCOUNTER — Ambulatory Visit: Payer: Medicare Other | Admitting: Internal Medicine

## 2022-06-23 ENCOUNTER — Ambulatory Visit: Payer: Self-pay

## 2022-06-23 NOTE — Patient Outreach (Signed)
  Care Coordination   06/23/2022 Name: MAECIE KILZER MRN: [id number] DOB: [birth year]   Care Coordination Outreach Attempts:  A second unsuccessful outreach was attempted today to offer the patient with information about available care coordination services.  Follow Up Plan:  Additional outreach attempts will be made to offer the patient care coordination information and services.   Encounter Outcome:  No Answer   Care Coordination Interventions:  No, not indicated    Kathyrn Sheriff, RN, MSN, BSN, CCM [facility] Care Coordinator [phone]

## 2022-06-24 ENCOUNTER — Ambulatory Visit: Payer: Medicare Other | Admitting: Internal Medicine

## 2022-06-25 ENCOUNTER — Ambulatory Visit: Payer: Medicare Other | Admitting: Nurse Practitioner

## 2022-06-25 ENCOUNTER — Encounter: Payer: Self-pay | Admitting: Nurse Practitioner

## 2022-06-25 VITALS — BP 94/62 | HR 62 | Ht 61.0 in | Wt 105.6 lb

## 2022-06-25 DIAGNOSIS — J449 Chronic obstructive pulmonary disease, unspecified: Secondary | ICD-10-CM | POA: Diagnosis not present

## 2022-06-25 DIAGNOSIS — J441 Chronic obstructive pulmonary disease with (acute) exacerbation: Secondary | ICD-10-CM | POA: Diagnosis not present

## 2022-06-25 DIAGNOSIS — F17210 Nicotine dependence, cigarettes, uncomplicated: Secondary | ICD-10-CM

## 2022-06-25 DIAGNOSIS — I5022 Chronic systolic (congestive) heart failure: Secondary | ICD-10-CM

## 2022-06-25 MED ORDER — PREDNISONE 10 MG PO TABS
ORAL_TABLET | ORAL | 0 refills | Status: DC
Start: 2022-06-25 — End: 2022-07-09

## 2022-06-25 MED ORDER — STIOLTO RESPIMAT 2.5-2.5 MCG/ACT IN AERS
2.0000 | INHALATION_SPRAY | Freq: Every day | RESPIRATORY_TRACT | 5 refills | Status: DC
Start: 2022-06-25 — End: 2022-12-19

## 2022-06-25 NOTE — Assessment & Plan Note (Signed)
Quit approx 1 month ago. Counseled to remain smoke free. We will refer her to the lung cancer screening program for yearly low dose CT chest.

## 2022-06-25 NOTE — Patient Instructions (Addendum)
Stop Arnuity. Start Stiolto 2 puffs daily. Continue Albuterol inhaler 2 puffs or 3 mL neb every 6 hours as needed for shortness of breath or wheezing. Notify if symptoms persist despite rescue inhaler/neb use. Use nebulizer at least twice a day until symptoms improve. This may cause some more jitteriness so just be aware of that while on the prednisone  Prednisone taper. 3 tabs for 3 days, then 2 tabs for 3 days, then 1 tab for 3 days, then stop. Take in AM with food.  Mucinex DM over the counter Twice daily as needed for cough/congestion  Referral to lung cancer screening program   Follow up in 2 weeks to see how new inhaler is going with Kimberly Lloyde Ludlam,NP then PFTs in 6-8 weeks with follow up with Dr. Sherene [name] or [facility] afterwards. If symptoms do not improve or worsen, please contact office for sooner follow up or seek emergency care.

## 2022-06-25 NOTE — Assessment & Plan Note (Signed)
Euvolemic on exam. Contributing factor to dyspnea, which we discussed today. Monitor weights at home. Follow up with Dr. Shirlee Latch as scheduled.

## 2022-06-25 NOTE — Progress Notes (Signed)
@Patient  ID: Kimberly [name], female    DOB: [birth year], 77 y.o.   MRN: [id number]  Chief Complaint  Patient presents with   Follow-up    Pt states she is doing better still short winded.     Referring provider: Corwin Levins, MD  HPI: 77 year old female, active smoker followed for severe COPD.  She is a patient of Dr. Thurston Hole and last seen in office 08/08/2021 by [facility] NP.  Past medical history significant for PVD, dilated cardiomyopathy with ICD, SVT, CHF, VSD status postrepair x2, hypertension, allergic rhinitis, GERD, cirrhosis, anxiety, depression, HLD.  TEST/EVENTS:  01/12/2017 PFTs: FVC 55, FEV1 37, ratio 54, TLC 93, DLCOunc 47. 05/26/2019 cardiopulmonary stress test: Submaximal, moderate HF limitation but more significant pulmonary limitation 09/26/2020 echocardiogram: EF 35-40 percent.  No definite residual VSD.  G1 DD.  RV function mildly reduced.  Normal PASP.  Trivial MR. 05/12/2021 CTA chest: No evidence of PE.  Mild cardiomegaly.  Atherosclerosis present.  There are scattered tree-in-bud opacities within the right lower lobe, likely infection.  Moderate centrilobular emphysema is present.  Nodular hepatic contour, consistent with cirrhosis. 07/29/2021 CXR 2 view: Both lungs are clear.  Lungs are hyperinflated with mild biapical pleural thickening which is unchanged.  No acute process. 04/24/2022 CXR: hyperinflation. No acute process.   07/27/2020: OV with Dr. Sherene Sires.  Breathing overall doing well.  Using albuterol around 2 times a day.  Sometimes has a nocturnal cough.  Maintained on Breztri.  Reeducated on albuterol as rescue medicine.  Recommended lung cancer screening CTs to set up through PCP.  Encourage smoking cessation.  08/08/2021: OV with Margreat Widener NP for acute visit.  She was recently treated by her PCP for AECOPD with prednisone burst and empiric doxycycline course, which she has completed.  She reported some low grade fevers but chest x-ray was clear.  She had apparently stopped her  Markus Daft and was advised to resume use.  Today, she reports that she is feeling better from last week. She is still a little more short of breath when compared to her baseline. Went to [employer] yesterday and had to stop a few times to catch her breath. Her cough is persistent and congested but less productive and sputum is no longer purulent. She has noticed an occasional wheeze. She was using her rescue inhaler multiple times a day last week. Has only had to use it 1-2 times over the past few days. She denies any recurrent fevers, chills, hemoptysis, orthopnea, PND, leg swelling. She is back on [place], feels like it is helping. She is still smoking but is down to 1-4 cigarettes a day. Dr. Jonny Ruiz had started her on Wellbutrin, which is helping her quit. Extended treatment for slow to resolve AECOPD with prednisone taper. Rx sent for her to have nebulizer available.   09/16/2021: OV with Emony Dormer NP for follow-up after being treated for persistent AECOPD.  She reports that her breathing has been doing better since last time she was here.  Feels like she recovered well.  Still has some occasional shortness of breath with exertion, but this is back to her baseline.  She also has a daily, congested cough which is normal for her.  This had gotten a little bit better when she had cut back on smoking.  Unfortunately, her husband had some health issues and difficulties recovering after a recent back surgery.  This is caused a significant amount of stress on her so she had started smoking more again.  She is  currently back down to close to 7-8 cigarettes a day, maybe less on some days. She wants to quit. Doesn't feel like the wellbutrin helped so she has stopped this. She denies any increased wheezing, hemoptysis, fevers, anorexia, weight loss, leg swelling, orthopnea. She continues on [place] Twice daily. She uses her nebulizer twice a day, prior to her [place]. Uses her rescue inhaler 1-2 times a week.   06/25/2022: Today -  follow up Patient presents today for overdue follow up. She has been having some more trouble with her breathing over the past few months. She was seen in the ED February 2024 for bronchitis and shingles. She was treated with doxycycline and acyclovir. She felt somewhat better but then had worsening DOE and productive cough towards the end of March. She was seen by her PCP 3/28 and treated with levaquin course and prednisone. She failed to improve and went to the ED on 04/24/2022. Imaging without superimposed infection. She was treated with high dosed steroids but was unable to tolerate these so she stopped them after a few days. She was still struggling with a cough when she saw her cardiologist on 4/15 so she was treated with an additional course of doxycycline. She tells me she has been feeling better since then. She still has a cough throughout the day but her phlegm is minimal and clear. She is noticing some more wheezing. Breathing feels like it is getting back to her baseline but still gets more short winded with more exerting activities. She feels like she's had a decline over the last year or so. She denies any fevers, chills, hemoptysis, leg swelling, orthopnea, palpitations. She is only on [place] right now. She does not like it because of the dry powder and having to always clear her mouth out afterwards. She's not sure it does much for her. She feels the albuterol inhaler and neb help more so she's using these a few times a day. She quit smoking over a month ago. Still struggling with cravings but focused on remaining smoke free.   Allergies  Allergen Reactions   Mupirocin Shortness Of Breath and Other (See Comments)    Burning, pain, swelling and sob   Codeine Nausea Only    Immunization History  Administered Date(s) Administered   Fluad Quad(high Dose 65+) 11/27/2021   Influenza Split 10/15/2020   Influenza, High Dose Seasonal PF 09/30/2018   Influenza,inj,Quad PF,6+ Mos 10/17/2015    Influenza,inj,quad, With Preservative 11/17/2016   Influenza-Unspecified 09/20/2013, 10/13/2017, 09/30/2018, 10/13/2019, 10/13/2019   PFIZER(Purple Top)SARS-COV-2 Vaccination 03/13/2019, 04/05/2019   Pneumococcal Conjugate-13 02/20/2010, 11/23/2012   Pneumococcal Polysaccharide-23 10/27/2017   Tdap 02/20/2009    Past Medical History:  Diagnosis Date   AICD (automatic cardioverter/defibrillator) present 03/17/2017   Anxiety    Atrial tachycardia    Basal cell carcinoma    R ant thigh, L pretibia, both txted at [facility]   Bursitis of shoulder, right, adhesive    Cancer (HCC)    CHF (congestive heart failure) (HCC)    Chronic bronchitis (HCC)    "1-2 times/yr" (01/23/2014)   COPD (chronic obstructive pulmonary disease) (HCC)    CVD (cerebrovascular disease)    Dyslipidemia    Dysrhythmia    Frequency of urination    GERD (gastroesophageal reflux disease)    Heart murmur    History of stomach ulcers    HTN (hypertension) 02/22/2011   Migraines    "stopped many years ago" (06/14/2014)   Osteoporosis 08/19/2016   Pericarditis  Pneumonia "10 times" (06/14/2014)   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)    Silent myocardial infarction [facility]) "late 1990's"   Stress incontinence    "was suppose to have been tacked up years ago but I didn't do it"   Syncope, near    Associated with atrial tachycardia-event recorder 1/16   Thoracic outlet syndrome    VSD (ventricular septal defect)     Tobacco History: Social History   Tobacco Use  Smoking Status Former   Packs/day: 0.50   Years: 35.00   Additional pack years: 0.00   Total pack years: 17.50   Types: Cigarettes  Smokeless Tobacco Never  Tobacco Comments   3-4 cigarettes daily. Trying to quit. HS   Counseling given: Not Answered Tobacco comments: 3-4 cigarettes daily. Trying to quit. HS   Outpatient Medications Prior to Visit  Medication Sig Dispense Refill   albuterol  (PROVENTIL) (2.5 MG/3ML) 0.083% nebulizer solution Take 3 mLs (2.5 mg total) by nebulization every 6 (six) hours as needed for wheezing or shortness of breath. 75 mL 0   albuterol (VENTOLIN HFA) 108 (90 Base) MCG/ACT inhaler Inhale 2 puffs into the lungs every 6 (six) hours as needed for wheezing or shortness of breath. 8.5 g 0   ALPRAZolam (XANAX) 0.5 MG tablet 1 tab by mouth twice per day as needed 60 tablet 2   aspirin 81 MG tablet Take 1 tablet (81 mg total) by mouth daily. 100 tablet 99   bisoprolol (ZEBETA) 5 MG tablet TAKE 1/2 TABLET (2.5 MG TOTAL) BY MOUTH DAILY 45 tablet 3   chlorpheniramine-HYDROcodone (TUSSIONEX) 10-8 MG/5ML Take 5 mLs by mouth every 12 (twelve) hours as needed for cough. 115 mL 0   digoxin (LANOXIN) 0.125 MG tablet Take 0.5 tablets (62.5 mcg total) by mouth daily. 45 tablet 3   furosemide (LASIX) 20 MG tablet Take 1 tablet (20 mg total) by mouth daily as needed. 90 tablet 1   HYDROcodone-acetaminophen (NORCO/VICODIN) 5-325 MG tablet Take 1 tablet by mouth every 6 (six) hours as needed for moderate pain. 30 tablet 0   ibuprofen (ADVIL,MOTRIN) 200 MG tablet Take 200 mg by mouth daily as needed for headache.     rosuvastatin (CRESTOR) 10 MG tablet TAKE 1 TABLET BY MOUTH DAILY 90 tablet 2   sacubitril-valsartan (ENTRESTO) 24-26 MG Take 1 tablet by mouth 2 (two) times daily.     spironolactone (ALDACTONE) 25 MG tablet Take 1 tablet (25 mg total) by mouth at bedtime. 30 tablet 3   tiZANidine (ZANAFLEX) 2 MG tablet Take 1 tablet (2 mg total) by mouth every 6 (six) hours as needed for muscle spasms. 40 tablet 1   Fluticasone Furoate (ARNUITY ELLIPTA) 100 MCG/ACT AEPB Inhale 100 mcg into the lungs daily at 6 (six) AM. 30 each 3   doxycycline (VIBRAMYCIN) 100 MG capsule Take 1 tablet by mouth twice daily for 7 days. (Patient not taking: Reported on 06/25/2022) 14 capsule 0   mometasone (ELOCON) 0.1 % cream Apply 1 Application topically daily as needed (Rash). (Patient not taking:  Reported on 06/25/2022) 45 g 0   No facility-administered medications prior to visit.     Review of Systems:   Constitutional: No weight loss or gain, night sweats, fevers, chills, fatigue, or lassitude. HEENT: No headaches, difficulty swallowing, tooth/dental problems, or sore throat. No sneezing, itching, ear ache, nasal congestion, or post nasal drip CV:  No chest pain, orthopnea, PND, swelling in lower extremities, anasarca, dizziness, palpitations, syncope Resp: +shortness of  breath with exertion; congested, minimally productive cough (baseline); wheeze. No hemoptysis.  No chest wall deformity GI:  No heartburn, indigestion, abdominal pain, nausea, vomiting, diarrhea, change in bowel habits, loss of appetite, bloody stools.  Skin: No rash, lesions, ulcerations MSK:  No joint pain or swelling.   Neuro: No dizziness or lightheadedness.  Psych: No depression or anxiety. Mood stable.     Physical Exam:  BP 94/62   Pulse 62   Ht 5\' 1"  (1.549 m)   Wt 105 lb 9.6 oz (47.9 kg)   SpO2 96%   BMI 19.95 kg/m   GEN: Pleasant, interactive, well-appearing; in no acute distress. HEENT:  Normocephalic and atraumatic. PERRLA. Sclera white. Nasal turbinates pink, moist and patent bilaterally. No rhinorrhea present. Oropharynx pink and moist, without exudate or edema. No lesions, ulcerations, or postnasal drip.  NECK:  Supple w/ fair ROM. No JVD present. Normal carotid impulses w/o bruits. Thyroid symmetrical with no goiter or nodules palpated. No lymphadenopathy.   CV: RRR, no m/r/g, no peripheral edema. Pulses intact, +2 bilaterally. No cyanosis, pallor or clubbing. PULMONARY:  Unlabored, regular breathing. Scattered expiratory wheezes bilaterally A&P. No accessory muscle use. No dullness to percussion. GI: BS present and normoactive. Soft, non-tender to palpation. No organomegaly or masses detected.  MSK: No erythema, warmth or tenderness. Cap refil <2 sec all extrem. No deformities or joint  swelling noted.  Neuro: A/Ox3. No focal deficits noted.   Skin: Warm, no lesions or rashe Psych: Normal affect and behavior. Judgement and thought content appropriate.     Lab Results:  CBC    Component Value Date/Time   WBC 10.7 (H) 04/24/2022 1055   RBC 4.76 04/24/2022 1055   HGB 14.1 04/24/2022 1055   HGB 13.9 10/06/2016 1418   HCT 44.9 04/24/2022 1055   HCT 42.1 10/06/2016 1418   PLT 308 04/24/2022 1055   PLT 273 10/06/2016 1418   MCV 94.3 04/24/2022 1055   MCV 87 10/06/2016 1418   MCH 29.6 04/24/2022 1055   MCHC 31.4 04/24/2022 1055   RDW 15.1 04/24/2022 1055   RDW 13.8 10/06/2016 1418   LYMPHSABS 0.9 10/31/2021 0924   LYMPHSABS 1.7 01/25/2013 0908   MONOABS 0.6 10/31/2021 0924   EOSABS 0.1 10/31/2021 0924   EOSABS 0.0 01/25/2013 0908   BASOSABS 0.0 10/31/2021 0924   BASOSABS 0.1 01/25/2013 0908    BMET    Component Value Date/Time   NA 139 05/05/2022 1418   NA 140 05/27/2021 1103   K 4.2 05/05/2022 1418   CL 102 05/05/2022 1418   CO2 30 05/05/2022 1418   GLUCOSE 84 05/05/2022 1418   BUN 20 05/05/2022 1418   BUN 28 (H) 05/27/2021 1103   CREATININE 1.03 (H) 05/05/2022 1418   CREATININE 0.81 11/02/2015 1251   CALCIUM 8.5 (L) 05/05/2022 1418   GFRNONAA 57 (L) 05/05/2022 1418   GFRAA >60 08/09/2019 1038    BNP    Component Value Date/Time   BNP 363.0 (H) 05/05/2022 1418     Imaging:  CUP PACEART REMOTE DEVICE CHECK  Result Date: 06/18/2022 Scheduled remote reviewed. Normal device function.  No events in review period. HF diagnostics normal. Next remote 91 days. Thompson Grayer, RN, CVRS        Latest Ref Rng & Units 01/12/2017    9:38 AM  PFT Results  FVC-Pre L 1.46   FVC-Predicted Pre % 55   FVC-Post L 1.64   FVC-Predicted Post % 61   Pre FEV1/FVC % % 52  Post FEV1/FCV % % 54   FEV1-Pre L 0.76   FEV1-Predicted Pre % 37   FEV1-Post L 0.88   DLCO uncorrected ml/min/mmHg 9.68   DLCO UNC% % 47   DLVA Predicted % 78   TLC L 4.30    TLC % Predicted % 93   RV % Predicted % 134     No results found for: "NITRICOXIDE"      Assessment & Plan:   COPD with acute exacerbation (HCC) Slow to resolve AECOPD with recurrent exacerbations over the last few months. I believe part of this is due to improper maintenance regimen. She is currently only on ICS, which she does not use consistently. Seems to receive more benefit from SABA. Stopped Breztri before due to palpitations but tolerates albuterol well. She does not have elevated peripheral eosinophils on previous testing. Recommend discontinuing ICS and switching her to LABA/LAMA therapy. We will monitor her on this and could consider adding back in ICS in the future but doesn't seem to provide her with much benefit. Samples of Stiolto provided - teachback performed. We will treat her with a prednisone taper to address current residual symptoms. Her infectious symptoms have improved and previous imaging without acute process. If no improvement or worsening, would recommend repeat imaging. Close follow up. Action plan in place. Given decline over the last year, will obtain repeat PFTs for further evaluation of lung function.   Patient Instructions  Stop Arnuity. Start Stiolto 2 puffs daily. Continue Albuterol inhaler 2 puffs or 3 mL neb every 6 hours as needed for shortness of breath or wheezing. Notify if symptoms persist despite rescue inhaler/neb use. Use nebulizer at least twice a day until symptoms improve. This may cause some more jitteriness so just be aware of that while on the prednisone  Prednisone taper. 3 tabs for 3 days, then 2 tabs for 3 days, then 1 tab for 3 days, then stop. Take in AM with food.  Mucinex DM over the counter Twice daily as needed for cough/congestion  Referral to lung cancer screening program   Follow up in 2 weeks to see how new inhaler is going with Katie Roslin Norwood,NP then PFTs in 6-8 weeks with follow up with Dr. Sherene Sires or [facility] afterwards. If symptoms do  not improve or worsen, please contact office for sooner follow up or seek emergency care.    Chronic systolic CHF (congestive heart failure) (HCC) Euvolemic on exam. Contributing factor to dyspnea, which we discussed today. Monitor weights at home. Follow up with Dr. Shirlee Latch as scheduled.   Cigarette smoker Quit approx 1 month ago. Counseled to remain smoke free. We will refer her to the lung cancer screening program for yearly low dose CT chest.     I spent 42 minutes of dedicated to the care of this patient on the date of this encounter to include pre-visit review of records, face-to-face time with the patient discussing conditions above, post visit ordering of testing, clinical documentation with the electronic health record, making appropriate referrals as documented, and communicating necessary findings to members of the patients care team.  Noemi Chapel, NP 06/25/2022  Pt aware and understands NP's role.

## 2022-06-25 NOTE — Assessment & Plan Note (Addendum)
Slow to resolve AECOPD with recurrent exacerbations over the last few months. I believe part of this is due to improper maintenance regimen. She is currently only on ICS, which she does not use consistently. Seems to receive more benefit from SABA. Stopped Breztri before due to palpitations but tolerates albuterol well. She does not have elevated peripheral eosinophils on previous testing. Recommend discontinuing ICS and switching her to LABA/LAMA therapy. We will monitor her on this and could consider adding back in ICS in the future but doesn't seem to provide her with much benefit. Samples of Stiolto provided - teachback performed. We will treat her with a prednisone taper to address current residual symptoms. Her infectious symptoms have improved and previous imaging without acute process. If no improvement or worsening, would recommend repeat imaging. Close follow up. Action plan in place. Given decline over the last year, will obtain repeat PFTs for further evaluation of lung function.   Patient Instructions  Stop Arnuity. Start Stiolto 2 puffs daily. Continue Albuterol inhaler 2 puffs or 3 mL neb every 6 hours as needed for shortness of breath or wheezing. Notify if symptoms persist despite rescue inhaler/neb use. Use nebulizer at least twice a day until symptoms improve. This may cause some more jitteriness so just be aware of that while on the prednisone  Prednisone taper. 3 tabs for 3 days, then 2 tabs for 3 days, then 1 tab for 3 days, then stop. Take in AM with food.  Mucinex DM over the counter Twice daily as needed for cough/congestion  Referral to lung cancer screening program   Follow up in 2 weeks to see how new inhaler is going with Katie Teyah Rossy,NP then PFTs in 6-8 weeks with follow up with Dr. Sherene Sires or [facility] afterwards. If symptoms do not improve or worsen, please contact office for sooner follow up or seek emergency care.

## 2022-06-26 ENCOUNTER — Other Ambulatory Visit: Payer: Self-pay | Admitting: Internal Medicine

## 2022-06-26 DIAGNOSIS — J441 Chronic obstructive pulmonary disease with (acute) exacerbation: Secondary | ICD-10-CM

## 2022-06-26 DIAGNOSIS — J449 Chronic obstructive pulmonary disease, unspecified: Secondary | ICD-10-CM

## 2022-07-09 ENCOUNTER — Ambulatory Visit: Payer: Medicare Other | Admitting: Nurse Practitioner

## 2022-07-09 ENCOUNTER — Encounter: Payer: Self-pay | Admitting: Nurse Practitioner

## 2022-07-09 VITALS — BP 94/60 | HR 60 | Ht 61.0 in | Wt 107.8 lb

## 2022-07-09 DIAGNOSIS — F17210 Nicotine dependence, cigarettes, uncomplicated: Secondary | ICD-10-CM | POA: Diagnosis not present

## 2022-07-09 DIAGNOSIS — J449 Chronic obstructive pulmonary disease, unspecified: Secondary | ICD-10-CM | POA: Diagnosis not present

## 2022-07-09 MED ORDER — STIOLTO RESPIMAT 2.5-2.5 MCG/ACT IN AERS
2.0000 | INHALATION_SPRAY | Freq: Every day | RESPIRATORY_TRACT | 11 refills | Status: DC
Start: 2022-07-09 — End: 2022-12-19

## 2022-07-09 MED ORDER — PREDNISONE 5 MG PO TABS: 5.0000 mg | ORAL_TABLET | Freq: Every day | ORAL | 5 refills | Status: DC

## 2022-07-09 NOTE — Assessment & Plan Note (Signed)
Counseled to remain smoke free. Referral to lung cancer screening program placed at 6/5.

## 2022-07-09 NOTE — Patient Instructions (Addendum)
Continue Stiolto 2 puffs daily. Continue Albuterol inhaler 2 puffs or 3 mL neb every 6 hours as needed for shortness of breath or wheezing. Notify if symptoms persist despite rescue inhaler/neb use.    Prednisone 5 mg daily. Take in AM with food   Follow up 6 weeks with repeat PFTs and Dr. Sherene Sires or Florentina Addison Peighton Mehra,NP afterwards. If symptoms do not improve or worsen, please contact office for sooner follow up or seek emergency care.

## 2022-07-09 NOTE — Progress Notes (Signed)
@Patient  ID: Kimberly [name], female    DOB: October 21, 1945, 77 y.o.   MRN: [id number]  Chief Complaint  Patient presents with   Follow-up    Pts states she is feeling better. Still using her inhaler and neb.    Referring provider: Corwin Levins, MD  HPI: 77 year old female, active smoker followed for severe COPD.  She is a patient of Dr. Thurston Hole and last seen in office 06/25/2022 by [facility] NP.  Past medical history significant for PVD, dilated cardiomyopathy with ICD, SVT, CHF, VSD status postrepair x2, hypertension, allergic rhinitis, GERD, cirrhosis, anxiety, depression, HLD.  TEST/EVENTS:  01/12/2017 PFTs: FVC 55, FEV1 37, ratio 54, TLC 93, DLCOunc 47. 05/26/2019 cardiopulmonary stress test: Submaximal, moderate HF limitation but more significant pulmonary limitation 09/26/2020 echocardiogram: EF 35-40 percent.  No definite residual VSD.  G1 DD.  RV function mildly reduced.  Normal PASP.  Trivial MR. 05/12/2021 CTA chest: No evidence of PE.  Mild cardiomegaly.  Atherosclerosis present.  There are scattered tree-in-bud opacities within the right lower lobe, likely infection.  Moderate centrilobular emphysema is present.  Nodular hepatic contour, consistent with cirrhosis. 07/29/2021 CXR 2 view: Both lungs are clear.  Lungs are hyperinflated with mild biapical pleural thickening which is unchanged.  No acute process. 04/24/2022 CXR: hyperinflation. No acute process.   07/27/2020: OV with Dr. Sherene Sires.  Breathing overall doing well.  Using albuterol around 2 times a day.  Sometimes has a nocturnal cough.  Maintained on Breztri.  Reeducated on albuterol as rescue medicine.  Recommended lung cancer screening CTs to set up through PCP.  Encourage smoking cessation.  08/08/2021: OV with Telesforo Brosnahan NP for acute visit.  She was recently treated by her PCP for AECOPD with prednisone burst and empiric doxycycline course, which she has completed.  She reported some low grade fevers but chest x-ray was clear.  She had apparently  stopped her Markus Daft and was advised to resume use.  Today, she reports that she is feeling better from last week. She is still a little more short of breath when compared to her baseline. Went to [employer] yesterday and had to stop a few times to catch her breath. Her cough is persistent and congested but less productive and sputum is no longer purulent. She has noticed an occasional wheeze. She was using her rescue inhaler multiple times a day last week. Has only had to use it 1-2 times over the past few days. She denies any recurrent fevers, chills, hemoptysis, orthopnea, PND, leg swelling. She is back on [place], feels like it is helping. She is still smoking but is down to 1-4 cigarettes a day. Dr. Jonny Ruiz had started her on Wellbutrin, which is helping her quit. Extended treatment for slow to resolve AECOPD with prednisone taper. Rx sent for her to have nebulizer available.   09/16/2021: OV with Lisseth Brazeau NP for follow-up after being treated for persistent AECOPD.  She reports that her breathing has been doing better since last time she was here.  Feels like she recovered well.  Still has some occasional shortness of breath with exertion, but this is back to her baseline.  She also has a daily, congested cough which is normal for her.  This had gotten a little bit better when she had cut back on smoking.  Unfortunately, her husband had some health issues and difficulties recovering after a recent back surgery.  This is caused a significant amount of stress on her so she had started smoking more again.  She is currently back down to close to 7-8 cigarettes a day, maybe less on some days. She wants to quit. Doesn't feel like the wellbutrin helped so she has stopped this. She denies any increased wheezing, hemoptysis, fevers, anorexia, weight loss, leg swelling, orthopnea. She continues on Portia Twice daily. She uses her nebulizer twice a day, prior to her [place]. Uses her rescue inhaler 1-2 times a week.   06/25/2022:  OV with Quint Chestnut NP for overdue follow up. She has been having some more trouble with her breathing over the past few months. She was seen in the ED February 2024 for bronchitis and shingles. She was treated with doxycycline and acyclovir. She felt somewhat better but then had worsening DOE and productive cough towards the end of March. She was seen by her PCP 3/28 and treated with levaquin course and prednisone. She failed to improve and went to the ED on 04/24/2022. Imaging without superimposed infection. She was treated with high dosed steroids but was unable to tolerate these so she stopped them after a few days. She was still struggling with a cough when she saw her cardiologist on 4/15 so she was treated with an additional course of doxycycline. She tells me she has been feeling better since then. She still has a cough throughout the day but her phlegm is minimal and clear. She is noticing some more wheezing. Breathing feels like it is getting back to her baseline but still gets more short winded with more exerting activities. She feels like she's had a decline over the last year or so. She denies any fevers, chills, hemoptysis, leg swelling, orthopnea, palpitations. She is only on Arunity right now. She does not like it because of the dry powder and having to always clear her mouth out afterwards. She's not sure it does much for her. She feels the albuterol inhaler and neb help more so she's using these a few times a day. She quit smoking over a month ago. Still struggling with cravings but focused on remaining smoke free.   07/09/2022: Today - follow up Patient presents today for follow up. She is feeling much better. She completed the steroids two days ago. She does feel like her breathing has been a little more short since coming off of it. She finds the steroids give her much more stamina and she's able to do things easier without having to sit to rest to catch her breath as well. She hasn't noticed any  wheezing. Her cough is better, minimal clear phlegm. Denies any fevers, chills, hemoptysis, leg swelling, orthopnea. She would like to see about staying on prednisone since she has had so much trouble over the last year. Remains smoke free. Using [employer] - this is $47, which is affordable but she would like to shop around to see if she can get it cheaper.   Allergies  Allergen Reactions   Mupirocin Shortness Of Breath and Other (See Comments)    Burning, pain, swelling and sob   Codeine Nausea Only    Immunization History  Administered Date(s) Administered   Fluad Quad(high Dose 65+) 11/27/2021   Influenza Split 10/15/2020   Influenza, High Dose Seasonal PF 09/30/2018   Influenza,inj,Quad PF,6+ Mos 10/17/2015   Influenza,inj,quad, With Preservative 11/17/2016   Influenza-Unspecified 09/20/2013, 10/13/2017, 09/30/2018, 10/13/2019, 10/13/2019   PFIZER(Purple Top)SARS-COV-2 Vaccination 03/13/2019, 04/05/2019   Pneumococcal Conjugate-13 02/20/2010, 11/23/2012   Pneumococcal Polysaccharide-23 10/27/2017   Tdap 02/20/2009    Past Medical History:  Diagnosis Date   AICD (  automatic cardioverter/defibrillator) present 03/17/2017   Anxiety    Atrial tachycardia    Basal cell carcinoma    R ant thigh, L pretibia, both txted at [facility]   Bursitis of shoulder, right, adhesive    Cancer (HCC)    CHF (congestive heart failure) (HCC)    Chronic bronchitis (HCC)    "1-2 times/yr" (01/23/2014)   COPD (chronic obstructive pulmonary disease) (HCC)    CVD (cerebrovascular disease)    Dyslipidemia    Dysrhythmia    Frequency of urination    GERD (gastroesophageal reflux disease)    Heart murmur    History of stomach ulcers    HTN (hypertension) 02/22/2011   Migraines    "stopped many years ago" (06/14/2014)   Osteoporosis 08/19/2016   Pericarditis    Pneumonia "10 times" (06/14/2014)   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)    Silent myocardial  infarction (HCC) "late 1990's"   Stress incontinence    "was suppose to have been tacked up years ago but I didn't do it"   Syncope, near    Associated with atrial tachycardia-event recorder 1/16   Thoracic outlet syndrome    VSD (ventricular septal defect)     Tobacco History: Social History   Tobacco Use  Smoking Status Former   Packs/day: 0.50   Years: 35.00   Additional pack years: 0.00   Total pack years: 17.50   Types: Cigarettes  Smokeless Tobacco Never  Tobacco Comments   3-4 cigarettes daily. Trying to quit. HS   Counseling given: Not Answered Tobacco comments: 3-4 cigarettes daily. Trying to quit. HS   Outpatient Medications Prior to Visit  Medication Sig Dispense Refill   albuterol (PROVENTIL) (2.5 MG/3ML) 0.083% nebulizer solution TAKE 3 MLS (1 VIAL) BY NEBULIZATION EVERY 6 HOURS AS NEEDED FOR WHEEZING OR SHORTNESS OF BREATH 90 mL 5   albuterol (VENTOLIN HFA) 108 (90 Base) MCG/ACT inhaler INHALE 2 PUFFS INTO THE LUNGS EVERY 6 HOURS AS NEEDED FOR WHEEZING OR SHORTNESS OF BREATH 8.5 g 5   ALPRAZolam (XANAX) 0.5 MG tablet 1 tab by mouth twice per day as needed 60 tablet 2   aspirin 81 MG tablet Take 1 tablet (81 mg total) by mouth daily. 100 tablet 99   bisoprolol (ZEBETA) 5 MG tablet TAKE 1/2 TABLET (2.5 MG TOTAL) BY MOUTH DAILY 45 tablet 3   chlorpheniramine-HYDROcodone (TUSSIONEX) 10-8 MG/5ML Take 5 mLs by mouth every 12 (twelve) hours as needed for cough. 115 mL 0   digoxin (LANOXIN) 0.125 MG tablet Take 0.5 tablets (62.5 mcg total) by mouth daily. 45 tablet 3   furosemide (LASIX) 20 MG tablet Take 1 tablet (20 mg total) by mouth daily as needed. 90 tablet 1   HYDROcodone-acetaminophen (NORCO/VICODIN) 5-325 MG tablet Take 1 tablet by mouth every 6 (six) hours as needed for moderate pain. 30 tablet 0   ibuprofen (ADVIL,MOTRIN) 200 MG tablet Take 200 mg by mouth daily as needed for headache.     rosuvastatin (CRESTOR) 10 MG tablet TAKE 1 TABLET BY MOUTH DAILY 90  tablet 2   sacubitril-valsartan (ENTRESTO) 24-26 MG Take 1 tablet by mouth 2 (two) times daily.     spironolactone (ALDACTONE) 25 MG tablet Take 1 tablet (25 mg total) by mouth at bedtime. 30 tablet 3   Tiotropium Bromide-Olodaterol (STIOLTO RESPIMAT) 2.5-2.5 MCG/ACT AERS Inhale 2 puffs into the lungs daily. 4 g 5   tiZANidine (ZANAFLEX) 2 MG tablet Take 1 tablet (2 mg total) by mouth  every 6 (six) hours as needed for muscle spasms. 40 tablet 1   doxycycline (VIBRAMYCIN) 100 MG capsule Take 1 tablet by mouth twice daily for 7 days. (Patient not taking: Reported on 06/25/2022) 14 capsule 0   mometasone (ELOCON) 0.1 % cream Apply 1 Application topically daily as needed (Rash). (Patient not taking: Reported on 06/25/2022) 45 g 0   predniSONE (DELTASONE) 10 MG tablet 3 tabs for 3 days, 2 tabs for 3 days, then 1 tab for 3 days, then stop (Patient not taking: Reported on 07/09/2022) 18 tablet 0   No facility-administered medications prior to visit.     Review of Systems:   Constitutional: No weight loss or gain, night sweats, fevers, chills, fatigue, or lassitude. HEENT: No headaches, difficulty swallowing, tooth/dental problems, or sore throat. No sneezing, itching, ear ache, nasal congestion, or post nasal drip CV:  No chest pain, orthopnea, PND, swelling in lower extremities, anasarca, dizziness, palpitations, syncope Resp: +shortness of breath with exertion; minimally productive cough (baseline). No wheeze. No hemoptysis.  No chest wall deformity GI:  No heartburn, indigestion, abdominal pain, nausea, vomiting, diarrhea, change in bowel habits, loss of appetite, bloody stools.  Skin: No rash, lesions, ulcerations MSK:  No joint pain or swelling.   Neuro: No dizziness or lightheadedness.  Psych: No depression or anxiety. Mood stable.     Physical Exam:  BP 94/60   Pulse 60   Ht 5\' 1"  (1.549 m)   Wt 107 lb 12.8 oz (48.9 kg)   SpO2 96%   BMI 20.37 kg/m   GEN: Pleasant, interactive,  well-appearing; in no acute distress. HEENT:  Normocephalic and atraumatic. PERRLA. Sclera white. Nasal turbinates pink, moist and patent bilaterally. No rhinorrhea present. Oropharynx pink and moist, without exudate or edema. No lesions, ulcerations, or postnasal drip.  NECK:  Supple w/ fair ROM. No JVD present. Normal carotid impulses w/o bruits. Thyroid symmetrical with no goiter or nodules palpated. No lymphadenopathy.   CV: RRR, no m/r/g, no peripheral edema. Pulses intact, +2 bilaterally. No cyanosis, pallor or clubbing. PULMONARY:  Unlabored, regular breathing. Diminished bilaterally A&P w/o wheezes/rales/rhonchi. No accessory muscle use. No dullness to percussion. GI: BS present and normoactive. Soft, non-tender to palpation. No organomegaly or masses detected.  MSK: No erythema, warmth or tenderness. Cap refil <2 sec all extrem. No deformities or joint swelling noted.  Neuro: A/Ox3. No focal deficits noted.   Skin: Warm, no lesions or rashe Psych: Normal affect and behavior. Judgement and thought content appropriate.     Lab Results:  CBC    Component Value Date/Time   WBC 10.7 (H) 04/24/2022 1055   RBC 4.76 04/24/2022 1055   HGB 14.1 04/24/2022 1055   HGB 13.9 10/06/2016 1418   HCT 44.9 04/24/2022 1055   HCT 42.1 10/06/2016 1418   PLT 308 04/24/2022 1055   PLT 273 10/06/2016 1418   MCV 94.3 04/24/2022 1055   MCV 87 10/06/2016 1418   MCH 29.6 04/24/2022 1055   MCHC 31.4 04/24/2022 1055   RDW 15.1 04/24/2022 1055   RDW 13.8 10/06/2016 1418   LYMPHSABS 0.9 10/31/2021 0924   LYMPHSABS 1.7 01/25/2013 0908   MONOABS 0.6 10/31/2021 0924   EOSABS 0.1 10/31/2021 0924   EOSABS 0.0 01/25/2013 0908   BASOSABS 0.0 10/31/2021 0924   BASOSABS 0.1 01/25/2013 0908    BMET    Component Value Date/Time   NA 139 05/05/2022 1418   NA 140 05/27/2021 1103   K 4.2 05/05/2022 1418   CL  102 05/05/2022 1418   CO2 30 05/05/2022 1418   GLUCOSE 84 05/05/2022 1418   BUN 20 05/05/2022  1418   BUN 28 (H) 05/27/2021 1103   CREATININE 1.03 (H) 05/05/2022 1418   CREATININE 0.81 11/02/2015 1251   CALCIUM 8.5 (L) 05/05/2022 1418   GFRNONAA 57 (L) 05/05/2022 1418   GFRAA >60 08/09/2019 1038    BNP    Component Value Date/Time   BNP 363.0 (H) 05/05/2022 1418     Imaging:  CUP PACEART REMOTE DEVICE CHECK  Result Date: 06/18/2022 Scheduled remote reviewed. Normal device function.  No events in review period. HF diagnostics normal. Next remote 91 days. Thompson Grayer, RN, CVRS        Latest Ref Rng & Units 01/12/2017    9:38 AM  PFT Results  FVC-Pre L 1.46   FVC-Predicted Pre % 55   FVC-Post L 1.64   FVC-Predicted Post % 61   Pre FEV1/FVC % % 52   Post FEV1/FCV % % 54   FEV1-Pre L 0.76   FEV1-Predicted Pre % 37   FEV1-Post L 0.88   DLCO uncorrected ml/min/mmHg 9.68   DLCO UNC% % 47   DLVA Predicted % 78   TLC L 4.30   TLC % Predicted % 93   RV % Predicted % 134     No results found for: "NITRICOXIDE"      Assessment & Plan:   COPD, severe (HCC) Severe COPD, prone to recurrent exacerbations with numerous steroid/abx courses over the last 6-12 months. She completed steroids on 6/17 with improvement. She is starting to have a slight increase in dyspnea but overall clinically improved. We discussed the use of daily low dose oral steroids and associated side effects. Shared decision to restart steroids at 5 mg daily; could consider increase to 10 mg if not optimally controlled. She will continue LABA/LAMA therapy. She did not tolerate Breztri or Arunity in the past so currently avoiding ICS. Action plan in place.  Patient Instructions  Continue Stiolto 2 puffs daily. Continue Albuterol inhaler 2 puffs or 3 mL neb every 6 hours as needed for shortness of breath or wheezing. Notify if symptoms persist despite rescue inhaler/neb use.    Prednisone 5 mg daily. Take in AM with food   Follow up 6 weeks with repeat PFTs and Dr. Sherene Sires or Florentina Addison Hartley Wyke,NP  afterwards. If symptoms do not improve or worsen, please contact office for sooner follow up or seek emergency care.    Cigarette smoker Counseled to remain smoke free. Referral to lung cancer screening program placed at 6/5.   I spent 42 minutes of dedicated to the care of this patient on the date of this encounter to include pre-visit review of records, face-to-face time with the patient discussing conditions above, post visit ordering of testing, clinical documentation with the electronic health record, making appropriate referrals as documented, and communicating necessary findings to members of the patients care team.  Noemi Chapel, NP 07/09/2022  Pt aware and understands NP's role.

## 2022-07-09 NOTE — Assessment & Plan Note (Signed)
Severe COPD, prone to recurrent exacerbations with numerous steroid/abx courses over the last 6-12 months. She completed steroids on 6/17 with improvement. She is starting to have a slight increase in dyspnea but overall clinically improved. We discussed the use of daily low dose oral steroids and associated side effects. Shared decision to restart steroids at 5 mg daily; could consider increase to 10 mg if not optimally controlled. She will continue LABA/LAMA therapy. She did not tolerate Breztri or Arunity in the past so currently avoiding ICS. Action plan in place.  Patient Instructions  Continue Stiolto 2 puffs daily. Continue Albuterol inhaler 2 puffs or 3 mL neb every 6 hours as needed for shortness of breath or wheezing. Notify if symptoms persist despite rescue inhaler/neb use.    Prednisone 5 mg daily. Take in AM with food   Follow up 6 weeks with repeat PFTs and Dr. Sherene Sires or Florentina Addison Tericka Devincenzi,NP afterwards. If symptoms do not improve or worsen, please contact office for sooner follow up or seek emergency care.

## 2022-07-10 NOTE — Progress Notes (Signed)
Remote ICD transmission.   

## 2022-07-29 DIAGNOSIS — H35033 Hypertensive retinopathy, bilateral: Secondary | ICD-10-CM | POA: Diagnosis not present

## 2022-07-29 DIAGNOSIS — H1045 Other chronic allergic conjunctivitis: Secondary | ICD-10-CM | POA: Diagnosis not present

## 2022-07-29 DIAGNOSIS — H2513 Age-related nuclear cataract, bilateral: Secondary | ICD-10-CM | POA: Diagnosis not present

## 2022-07-30 ENCOUNTER — Encounter: Payer: Self-pay | Admitting: Internal Medicine

## 2022-07-30 ENCOUNTER — Ambulatory Visit (INDEPENDENT_AMBULATORY_CARE_PROVIDER_SITE_OTHER): Payer: Medicare Other | Admitting: Internal Medicine

## 2022-07-30 VITALS — BP 120/76 | HR 62 | Temp 98.3°F | Ht 61.0 in | Wt 106.0 lb

## 2022-07-30 DIAGNOSIS — J441 Chronic obstructive pulmonary disease with (acute) exacerbation: Secondary | ICD-10-CM

## 2022-07-30 DIAGNOSIS — F419 Anxiety disorder, unspecified: Secondary | ICD-10-CM

## 2022-07-30 DIAGNOSIS — I5022 Chronic systolic (congestive) heart failure: Secondary | ICD-10-CM

## 2022-07-30 DIAGNOSIS — E538 Deficiency of other specified B group vitamins: Secondary | ICD-10-CM | POA: Diagnosis not present

## 2022-07-30 DIAGNOSIS — E785 Hyperlipidemia, unspecified: Secondary | ICD-10-CM

## 2022-07-30 DIAGNOSIS — R739 Hyperglycemia, unspecified: Secondary | ICD-10-CM | POA: Diagnosis not present

## 2022-07-30 DIAGNOSIS — E559 Vitamin D deficiency, unspecified: Secondary | ICD-10-CM | POA: Diagnosis not present

## 2022-07-30 DIAGNOSIS — Z0000 Encounter for general adult medical examination without abnormal findings: Secondary | ICD-10-CM

## 2022-07-30 DIAGNOSIS — Z0001 Encounter for general adult medical examination with abnormal findings: Secondary | ICD-10-CM

## 2022-07-30 LAB — CBC WITH DIFFERENTIAL/PLATELET
Basophils Absolute: 0.1 10*3/uL (ref 0.0–0.1)
Basophils Relative: 0.6 % (ref 0.0–3.0)
Eosinophils Absolute: 0.2 10*3/uL (ref 0.0–0.7)
Eosinophils Relative: 1.9 % (ref 0.0–5.0)
HCT: 42 % (ref 36.0–46.0)
Hemoglobin: 13.6 g/dL (ref 12.0–15.0)
Lymphocytes Relative: 14.4 % (ref 12.0–46.0)
Lymphs Abs: 1.2 10*3/uL (ref 0.7–4.0)
MCHC: 32.3 g/dL (ref 30.0–36.0)
MCV: 93.2 fl (ref 78.0–100.0)
Monocytes Absolute: 0.5 10*3/uL (ref 0.1–1.0)
Monocytes Relative: 6.1 % (ref 3.0–12.0)
Neutro Abs: 6.5 10*3/uL (ref 1.4–7.7)
Neutrophils Relative %: 77 % (ref 43.0–77.0)
Platelets: 262 10*3/uL (ref 150.0–400.0)
RBC: 4.51 Mil/uL (ref 3.87–5.11)
RDW: 15.7 % — ABNORMAL HIGH (ref 11.5–15.5)
WBC: 8.5 10*3/uL (ref 4.0–10.5)

## 2022-07-30 LAB — BASIC METABOLIC PANEL
BUN: 20 mg/dL (ref 6–23)
CO2: 30 mEq/L (ref 19–32)
Calcium: 9.7 mg/dL (ref 8.4–10.5)
Chloride: 102 mEq/L (ref 96–112)
Creatinine, Ser: 1.05 mg/dL (ref 0.40–1.20)
GFR: 52 mL/min — ABNORMAL LOW (ref >60.00)
Glucose, Bld: 102 mg/dL — ABNORMAL HIGH (ref 70–99)
Potassium: 4.9 mEq/L (ref 3.5–5.1)
Sodium: 140 mEq/L (ref 135–145)

## 2022-07-30 LAB — HEPATIC FUNCTION PANEL
ALT: 14 U/L (ref 0–35)
AST: 17 U/L (ref 0–37)
Albumin: 4.5 g/dL (ref 3.5–5.2)
Alkaline Phosphatase: 59 U/L (ref 39–117)
Bilirubin, Direct: 0.2 mg/dL (ref 0.0–0.3)
Total Bilirubin: 0.9 mg/dL (ref 0.2–1.2)
Total Protein: 7.1 g/dL (ref 6.0–8.3)

## 2022-07-30 LAB — MICROALBUMIN / CREATININE URINE RATIO
Creatinine,U: 103.7 mg/dL
Microalb Creat Ratio: 4.3 mg/g (ref 0.0–30.0)
Microalb, Ur: 4.5 mg/dL — ABNORMAL HIGH (ref 0.0–1.9)

## 2022-07-30 LAB — LIPID PANEL
Cholesterol: 180 mg/dL (ref 0–200)
HDL: 99.7 mg/dL (ref >39.00)
LDL Cholesterol: 68 mg/dL (ref 0–99)
NonHDL: 80.11
Total CHOL/HDL Ratio: 2
Triglycerides: 62 mg/dL (ref 0.0–149.0)
VLDL: 12.4 mg/dL (ref 0.0–40.0)

## 2022-07-30 LAB — TSH: TSH: 0.86 u[IU]/mL (ref 0.35–5.50)

## 2022-07-30 LAB — VITAMIN B12: Vitamin B-12: 163 pg/mL — ABNORMAL LOW (ref 211–911)

## 2022-07-30 LAB — HEMOGLOBIN A1C: Hgb A1c MFr Bld: 5.8 % (ref 4.6–6.5)

## 2022-07-30 LAB — VITAMIN D 25 HYDROXY (VIT D DEFICIENCY, FRACTURES): VITD: 22.88 ng/mL — ABNORMAL LOW (ref 30.00–100.00)

## 2022-07-30 MED ORDER — ALPRAZOLAM 0.5 MG PO TABS: ORAL_TABLET | ORAL | 5 refills | Status: DC

## 2022-07-30 NOTE — Progress Notes (Unsigned)
Patient ID: Kimberly [name], female   DOB: 1945/07/18, 77 y.o.   MRN: [id number]         Chief Complaint:: wellness exam and anxiety, copd, low vit d, chf       HPI:  Kimberly [name] is a 77 y.o. female here for wellness exam; declines colonoscopy, covid booster, tdap, shingris for now, o/w up to date                        Also Pt denies chest pain, wheezing, orthopnea, PND, increased LE swelling, palpitations, dizziness or syncope, but overall has worsening sob doe, but controlled currently with addition of prednisone 5 mg.  Denies worsening depressive symptoms, suicidal ideation, or panic; has ongoing anxiety, asks for xanax refill.  Delcines SSRI for now.   Pt denies polydipsia, polyuria, or new focal neuro s/s.    Wt Readings from Last 3 Encounters:  07/30/22 106 lb (48.1 kg)  07/09/22 107 lb 12.8 oz (48.9 kg)  06/25/22 105 lb 9.6 oz (47.9 kg)   BP Readings from Last 3 Encounters:  07/30/22 120/76  07/09/22 94/60  06/25/22 94/62   Immunization History  Administered Date(s) Administered   Fluad Quad(high Dose 65+) 11/27/2021   Influenza Split 10/15/2020   Influenza, High Dose Seasonal PF 09/30/2018   Influenza,inj,Quad PF,6+ Mos 10/17/2015   Influenza,inj,quad, With Preservative 11/17/2016   Influenza-Unspecified 09/20/2013, 10/13/2017, 09/30/2018, 10/13/2019, 10/13/2019   PFIZER(Purple Top)SARS-COV-2 Vaccination 03/13/2019, 04/05/2019   Pneumococcal Conjugate-13 02/20/2010, 11/23/2012   Pneumococcal Polysaccharide-23 10/27/2017   Tdap 02/20/2009   Health Maintenance Due  Topic Date Due   Zoster Vaccines- Shingrix (1 of 2) Never done   Colonoscopy  Never done   DTaP/Tdap/Td (2 - Td or Tdap) 02/21/2019   COVID-19 Vaccine (3 - Pfizer risk series) 05/03/2019      Past Medical History:  Diagnosis Date   AICD (automatic cardioverter/defibrillator) present 03/17/2017   Anxiety    Atrial tachycardia    Basal cell carcinoma    R ant thigh, L pretibia, [facility]   Bursitis of shoulder, right, adhesive    Cancer (HCC)    CHF (congestive heart failure) (HCC)    Chronic bronchitis (HCC)    "1-2 times/yr" (01/23/2014)   COPD (chronic obstructive pulmonary disease) (HCC)    CVD (cerebrovascular disease)    Dyslipidemia    Dysrhythmia    Frequency of urination    GERD (gastroesophageal reflux disease)    Heart murmur    History of stomach ulcers    HTN (hypertension) 02/22/2011   Migraines    "stopped many years ago" (06/14/2014)   Osteoporosis 08/19/2016   Pericarditis    Pneumonia "10 times" (06/14/2014)   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)    Silent myocardial infarction (HCC) "late 1990's"   Stress incontinence    "was suppose to have been tacked up years ago but I didn't do it"   Syncope, near    Associated with atrial tachycardia-event recorder 1/16   Thoracic outlet syndrome    VSD (ventricular septal defect)    Past Surgical History:  Procedure Laterality Date   ABDOMINAL AORTOGRAM W/LOWER EXTREMITY N/A 12/17/2016   Procedure: ABDOMINAL AORTOGRAM W/LOWER EXTREMITY;  Surgeon: Iran Ouch, MD;  Location: [facility];  Service: Cardiovascular;  Laterality: N/A;   BIV ICD INSERTION CRT-D N/A 03/17/2017   Procedure: BIV ICD INSERTION CRT-D;  Surgeon: Marinus Maw, MD;  Location: [facility];  Service: Cardiovascular;  Laterality: N/A;   CARDIAC CATHETERIZATION  "quite a few"   CESAREAN SECTION  1972   CHOLECYSTECTOMY OPEN  1970's   CORONARY ANGIOGRAM  2000   No significant CAD   ELECTROPHYSIOLOGIC STUDY N/A 06/14/2014   Procedure: A-Flutter/A-Tach/SVT Ablation;  Surgeon: Marinus Maw, MD;  Location: [facility];  Service: Cardiovascular;  Laterality: N/A;   INSERTION OF ICD  03/17/2017   BIV   MULTIPLE TOOTH EXTRACTIONS     MYRINGOTOMY WITH TUBE PLACEMENT Right 2015   PERIPHERAL VASCULAR INTERVENTION  12/17/2016   Procedure: PERIPHERAL VASCULAR INTERVENTION;   Surgeon: Iran Ouch, MD;  Location: [facility];  Service: Cardiovascular;;  Right common femoral PTA and Stent   RIGHT/LEFT HEART CATH AND CORONARY ANGIOGRAPHY Bilateral 11/06/2016   Procedure: RIGHT/LEFT HEART CATH AND CORONARY ANGIOGRAPHY;  Surgeon: Iran Ouch, MD;  Location: [facility] INVASIVE CV LAB;  Service: Cardiovascular;  Laterality: Bilateral;   SVT ABLATION  06/14/2014   TUBAL LIGATION  1972   VSD REPAIR  1958; 1967    reports that she has quit smoking. Her smoking use included cigarettes. She has a 17.5 pack-year smoking history. She has never used smokeless tobacco. She reports that she does not currently use alcohol after a past usage of about 1.0 standard drink of alcohol per week. She reports that she does not use drugs. family history includes Alcohol abuse in an other family member; Arthritis in some other family members; Cancer in her father, mother, and another family member; Hypertension in some other family members; Stroke in some other family members; Throat cancer in an other family member. Allergies  Allergen Reactions   Mupirocin Shortness Of Breath and Other (See Comments)    Burning, pain, swelling and sob   Codeine Nausea Only   Current Outpatient Medications on File Prior to Visit  Medication Sig Dispense Refill   albuterol (PROVENTIL) (2.5 MG/3ML) 0.083% nebulizer solution TAKE 3 MLS (1 VIAL) BY NEBULIZATION EVERY 6 HOURS AS NEEDED FOR WHEEZING OR SHORTNESS OF BREATH 90 mL 5   albuterol (VENTOLIN HFA) 108 (90 Base) MCG/ACT inhaler INHALE 2 PUFFS INTO THE LUNGS EVERY 6 HOURS AS NEEDED FOR WHEEZING OR SHORTNESS OF BREATH 8.5 g 5   aspirin 81 MG tablet Take 1 tablet (81 mg total) by mouth daily. 100 tablet 99   bisoprolol (ZEBETA) 5 MG tablet TAKE 1/2 TABLET (2.5 MG TOTAL) BY MOUTH DAILY 45 tablet 3   chlorpheniramine-HYDROcodone (TUSSIONEX) 10-8 MG/5ML Take 5 mLs by mouth every 12 (twelve) hours as needed for cough. 115 mL 0   digoxin (LANOXIN)  0.125 MG tablet Take 0.5 tablets (62.5 mcg total) by mouth daily. 45 tablet 3   doxycycline (VIBRAMYCIN) 100 MG capsule Take 1 tablet by mouth twice daily for 7 days. 14 capsule 0   furosemide (LASIX) 20 MG tablet Take 1 tablet (20 mg total) by mouth daily as needed. 90 tablet 1   HYDROcodone-acetaminophen (NORCO/VICODIN) 5-325 MG tablet Take 1 tablet by mouth every 6 (six) hours as needed for moderate pain. 30 tablet 0   ibuprofen (ADVIL,MOTRIN) 200 MG tablet Take 200 mg by mouth daily as needed for headache.     mometasone (ELOCON) 0.1 % cream Apply 1 Application topically daily as needed (Rash). 45 g 0   predniSONE (DELTASONE) 5 MG tablet Take 1 tablet (5 mg total) by mouth daily with breakfast. 30 tablet 5   rosuvastatin (CRESTOR) 10 MG tablet  TAKE 1 TABLET BY MOUTH DAILY 90 tablet 2   sacubitril-valsartan (ENTRESTO) 24-26 MG Take 1 tablet by mouth 2 (two) times daily.     spironolactone (ALDACTONE) 25 MG tablet Take 1 tablet (25 mg total) by mouth at bedtime. 30 tablet 3   Tiotropium Bromide-Olodaterol (STIOLTO RESPIMAT) 2.5-2.5 MCG/ACT AERS Inhale 2 puffs into the lungs daily. 4 g 5   Tiotropium Bromide-Olodaterol (STIOLTO RESPIMAT) 2.5-2.5 MCG/ACT AERS Inhale 2 puffs into the lungs daily. 4 g 11   tiZANidine (ZANAFLEX) 2 MG tablet Take 1 tablet (2 mg total) by mouth every 6 (six) hours as needed for muscle spasms. 40 tablet 1   No current facility-administered medications on file prior to visit.        ROS:  All others reviewed and negative.  Objective        PE:  BP 120/76 (BP Location: Right Arm, Patient Position: Sitting, Cuff Size: Normal)   Pulse 62   Temp 98.3 F (36.8 C) (Oral)   Ht 5\' 1"  (1.549 m)   Wt 106 lb (48.1 kg)   SpO2 98%   BMI 20.03 kg/m                 Constitutional: Pt appears in NAD               HENT: Head: NCAT.                Right Ear: External ear normal.                 Left Ear: External ear normal.                Eyes: . Pupils are equal, round,  and reactive to light. Conjunctivae and EOM are normal               Nose: without d/c or deformity               Neck: Neck supple. Gross normal ROM               Cardiovascular: Normal rate and regular rhythm.                 Pulmonary/Chest: Effort normal and breath sounds decreased without rales or wheezing.                Abd:  Soft, NT, ND, + BS, no organomegaly               Neurological: Pt is alert. At baseline orientation, motor grossly intact               Skin: Skin is warm. No rashes, no other new lesions, LE edema - none               Psychiatric: Pt behavior is normal without agitation   Micro: none  Cardiac tracings I have personally interpreted today:  none  Pertinent Radiological findings (summarize): none   Lab Results  Component Value Date   WBC 8.5 07/30/2022   HGB 13.6 07/30/2022   HCT 42.0 07/30/2022   PLT 262.0 07/30/2022   GLUCOSE 102 (H) 07/30/2022   CHOL 180 07/30/2022   TRIG 62.0 07/30/2022   HDL 99.70 07/30/2022   LDLDIRECT 112.6 03/16/2013   LDLCALC 68 07/30/2022   ALT 14 07/30/2022   AST 17 07/30/2022   NA 140 07/30/2022   K 4.9 07/30/2022   CL 102 07/30/2022   CREATININE 1.05 07/30/2022   BUN 20  07/30/2022   CO2 30 07/30/2022   TSH 0.86 07/30/2022   INR 1.02 12/15/2016   HGBA1C 5.8 07/30/2022   MICROALBUR 4.5 (H) 07/30/2022   Assessment/Plan:  ROZENA FIERRO is a 77 y.o. White or Caucasian [1] female with  has a past medical history of AICD (automatic cardioverter/defibrillator) present (03/17/2017), Anxiety, Atrial tachycardia, Basal cell carcinoma, Bursitis of shoulder, right, adhesive, Cancer (HCC), CHF (congestive heart failure) (HCC), Chronic bronchitis (HCC), COPD (chronic obstructive pulmonary disease) (HCC), CVD (cerebrovascular disease), Dyslipidemia, Dysrhythmia, Frequency of urination, GERD (gastroesophageal reflux disease), Heart murmur, History of stomach ulcers, HTN (hypertension) (02/22/2011), Migraines, Osteoporosis  (08/19/2016), Pericarditis, Pneumonia ("10 times" (06/14/2014)), 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), Silent myocardial infarction (HCC) ("late 1990's"), Stress incontinence, Syncope, near, Thoracic outlet syndrome, and VSD (ventricular septal defect).  Chronic systolic CHF (congestive heart failure) (HCC) Stable volume, cont current med tx  Encounter for well adult exam with abnormal findings Age and sex appropriate education and counseling updated with regular exercise and diet Referrals for preventative services - none needed Immunizations addressed - none needed Smoking counseling  - none needed Evidence for depression or other mood disorder - stable anxiety Most recent labs reviewed. I have personally reviewed and have noted: 1) the patient's medical and social history 2) The patient's current medications and supplements 3) The patient's height, weight, and BMI have been recorded in the chart   COPD with acute exacerbation (HCC) Controlled with inhaler and prednisone 5 mg daily  Anxiety Stable, continue xanax prn  B12 deficiency Lab Results  Component Value Date   VITAMINB12 163 (L) 07/30/2022   Low to start oral replacement - b12 1000 mcg qd   Dyslipidemia Lab Results  Component Value Date   LDLCALC 68 07/30/2022   Stable, pt to continue current statin crestor 10 qd   Hyperglycemia Lab Results  Component Value Date   HGBA1C 5.8 07/30/2022   Stable, pt to continue current medical treatment  - diet, wt control   Vitamin D deficiency Last vitamin D Lab Results  Component Value Date   VD25OH 22.88 (L) 07/30/2022   Low, to start oral replacement  Followup: Return in about 6 months (around 01/30/2023).  Oliver Barre, MD 07/31/2022 9:24 PM [facility]

## 2022-07-30 NOTE — Patient Instructions (Signed)
Please continue all other medications as before, and refills have been done if requested.  Please have the pharmacy call with any other refills you may need.  Please continue your efforts at being more active, low cholesterol diet, and weight control.  You are otherwise up to date with prevention measures today.  Please keep your appointments with your specialists as you may have planned  Please go to the LAB at the blood drawing area for the tests to be done  You will be contacted by phone if any changes need to be made immediately.  Otherwise, you will receive a letter about your results with an explanation, but please check with MyChart first.  Please make an Appointment to return in 6 months, or sooner if needed 

## 2022-07-31 ENCOUNTER — Encounter: Payer: Self-pay | Admitting: Internal Medicine

## 2022-07-31 NOTE — Assessment & Plan Note (Signed)
Age and sex appropriate education and counseling updated with regular exercise and diet Referrals for preventative services - none needed Immunizations addressed - none needed Smoking counseling  - none needed Evidence for depression or other mood disorder - stable anxiety Most recent labs reviewed. I have personally reviewed and have noted: 1) the patient's medical and social history 2) The patient's current medications and supplements 3) The patient's height, weight, and BMI have been recorded in the chart

## 2022-07-31 NOTE — Assessment & Plan Note (Signed)
Lab Results  Component Value Date   LDLCALC 68 07/30/2022   Stable, pt to continue current statin crestor 10 qd

## 2022-07-31 NOTE — Assessment & Plan Note (Signed)
Lab Results  Component Value Date   HGBA1C 5.8 07/30/2022   Stable, pt to continue current medical treatment  - diet, wt control

## 2022-07-31 NOTE — Assessment & Plan Note (Signed)
Last vitamin D Lab Results  Component Value Date   VD25OH 22.88 (L) 07/30/2022   Low, to start oral replacement

## 2022-07-31 NOTE — Assessment & Plan Note (Signed)
Lab Results  Component Value Date   VITAMINB12 163 (L) 07/30/2022   Low to start oral replacement - b12 1000 mcg qd

## 2022-07-31 NOTE — Assessment & Plan Note (Signed)
Controlled with inhaler and prednisone 5 mg daily

## 2022-07-31 NOTE — Assessment & Plan Note (Signed)
Stable, continue xanax prn

## 2022-07-31 NOTE — Assessment & Plan Note (Signed)
Stable volume, cont current med tx 

## 2022-08-01 ENCOUNTER — Ambulatory Visit: Payer: Medicare Other | Attending: Cardiology | Admitting: Cardiology

## 2022-08-01 DIAGNOSIS — I5022 Chronic systolic (congestive) heart failure: Secondary | ICD-10-CM | POA: Diagnosis not present

## 2022-08-01 LAB — CUP PACEART REMOTE DEVICE CHECK
Battery Remaining Longevity: 72 mo
Battery Remaining Percentage: 80 %
Brady Statistic RA Percent Paced: 48 %
Brady Statistic RV Percent Paced: 90 %
Date Time Interrogation Session: 20240712095100
HighPow Impedance: 64 Ohm
Implantable Lead Connection Status: 753985
Implantable Lead Connection Status: 753985
Implantable Lead Connection Status: 753985
Implantable Lead Implant Date: 20190226
Implantable Lead Implant Date: 20190226
Implantable Lead Implant Date: 20190226
Implantable Lead Location: 753858
Implantable Lead Location: 753859
Implantable Lead Location: 753860
Implantable Lead Model: 292
Implantable Lead Model: 4671
Implantable Lead Model: 7740
Implantable Lead Serial Number: 444536
Implantable Lead Serial Number: 726363
Implantable Lead Serial Number: 805938
Implantable Pulse Generator Implant Date: 20190226
Lead Channel Impedance Value: 424 Ohm
Lead Channel Impedance Value: 533 Ohm
Lead Channel Impedance Value: 728 Ohm
Lead Channel Setting Pacing Amplitude: 2 V
Lead Channel Setting Pacing Amplitude: 2.5 V
Lead Channel Setting Pacing Amplitude: 2.6 V
Lead Channel Setting Pacing Pulse Width: 0.4 ms
Lead Channel Setting Pacing Pulse Width: 0.4 ms
Lead Channel Setting Sensing Sensitivity: 0.5 mV
Lead Channel Setting Sensing Sensitivity: 1 mV
Pulse Gen Serial Number: 204176
Zone Setting Status: 755011

## 2022-08-01 NOTE — Patient Instructions (Signed)
   Lab Work:  Labs done today, your results will be available in MyChart, we will contact you for abnormal readings. You may have lab work done today or in a week.  Testing/Procedures:  Your physician has requested that you have an echocardiogram. Echocardiography is a painless test that uses sound waves to create images of your heart. It provides your doctor with information about the size and shape of your heart and how well your heart's chambers and valves are working. This procedure takes approximately one hour. There are no restrictions for this procedure. Please do NOT wear cologne, perfume, aftershave, or lotions (deodorant is allowed). Please arrive 15 minutes prior to your appointment time.   Special Instructions // Education:  Do the following things EVERYDAY: Weigh yourself in the morning before breakfast. Write it down and keep it in a log. Take your medicines as prescribed Eat low salt foods--Limit salt (sodium) to 2000 mg per day.  Stay as active as you can everyday Limit all fluids for the day to less than 2 liters   Follow-Up in: follow up in 4 months after ECHO.    If you have any questions or concerns before your next appointment please send [place] a message through [place] or call our office at [phone] Monday-Friday 8 am-5 pm.   If you have an urgent need after hours on the weekend please call your Primary Cardiologist or the [facility] in [facility] at [phone].

## 2022-08-03 NOTE — Progress Notes (Signed)
PCP: Dr. Jonny Ruiz Cardiology: Dr. Tenny Craw HF Cardiology: Dr. Shirlee Latch Cardiology/Vascular: Dr Kirke Corin  77 y.o.with history of VSD repair as a child, COPD/active smoking, PAD, and chronic systolic CHF.  Patient has a long history of cardiomyopathy.  She had VSD repaired as a child and imaging has not showed residual VSD.  Cardiac MRI in 11/09 showed EF 36%.  Echo in 12/15 showed EF 40-45%.  Echo in 3/18 showed EF down to 15-20% with moderate RV systolic dysfunction.  RHC/LHC in 10/18 showed nonobstructive coronary but was concerning for low output (CI 1.33).  She had a nonhealing right lower extremity wound.  Peripheral angiogram with bilateral CFA occlusions and underwent DCB/self expanding stent to to right CFA.    CPX in 1/19 showed moderate to severe functional limitation, combination of HF and lung disease, probably more related to the lung disease.  PFTs in 12/18 showed severe COPD.   She had a [employer] CRT-D device placed.    7/19 peripheral arterial dopplers showed significant in-stent restenosis right CFA stent.  Echo in 4/21 showed EF 35-40% with mild AI.  CPX in 5/21 showed severe COPD, mild-moderate HF limitation.  11/21 Cardiolite showed prior septal and apical infarction, no ischemia.   Had COVID April 2022. She has continued to recover.   Echo in 9/22 showed EF 35-40% with basal-mid septal akinesis and anterior hypokinesis, no evidence for residual VSD, mildly decreased RV systolic function.  Echo in 11/23 showed EF 40% with basal to mid anteroseptal and inferoseptal akinesis, mild RV dysfunction, PASP 33 mmHg.   She returns for followup of CHF. She is staying off cigarettes. Weight up 4 lbs.  No palpitations.  She is breathing better, no recent wheezing/COPD flare.  Rarely taking Lasix (2x/month). Lightheaded if she bends over then stands up.  2 weeks ago, she had an episode of right-sided chest pain after drinking coffee.  She pain was very severe but only lasted a matter of  seconds and has not recurred.   Labs (12/22): BNP 418, digoxin 0.5, K 5, creatinine 1.17 Labs (8/23): BNP 222, digoxin 0.7, LDL 68, K 4.4, creatinine 1.61 Labs (10/23): K 3.7, creatinine 0.96 Labs (4/24): hgb 14.1, K 4.2, creatinine 1.14, digoxin level 0.6 Labs (7/24): K 4.9, creatinine 1.05, hgb 13.6, LDL 68  [name] CRT-D device interrogation: [facility]ic 0, no AF, 1 episode NSVT, 90% BiV pacing (?lower due to PVCs)  ECG (personally reviewed): A-BiV paced, PVC  PMH: 1. VSD: s/p repair at [facility] as child.  From description, sounds like muscular VSD.  2. Atrial tachycardia s/p ablation.  3. Hyperlipidemia 4. COPD: Quit smoking 2024 - PFTs (12/18): Severe obstructive lung disease.  5. Atrial fibrillation: Paroxysmal.   Noted only transiently.   6. PAD:  - Angiogram 11/18 showed totally occluded bilateral CFAs.  Patient had stent to right CFA.  ABIs in 12/18 were normal on right. - ABIs (10/20): ABI 0.8 right, 0.74 left - 12/21 dopplers with R CFA stent in-stenosis.  - ABIs (1/24): normal on right, 0.68 on left with occluded left CFA.  7. Chronic systolic CHF: Nonischemic cardiomyopathy.   - cMRI (11/09): EF 36%, VSD patch in anterior ventricular septum - Echo (12/15): EF 40-45% - Echo (3/18): EF 15-20%, moderate LV dilation, moderate MR, moderate RV dilation with moderately decreased systolic function.  - LHC/RHC (10/18): 3+ MR, EF < 25%, minimal nonobstructive CAD; PA 41/21, LVEDP 18, CI 1.33.  - CPX (1/19): peak VO2 13.4, VE/VCO2 slope 43, RER 1.07.  Mod-severe  functional limitation due to combination of HF and lung disease, probably more related to lung disease.  - [name] CRT-D device.  - Echo (7/19): EF 25-30%, mild LV dilation, mild MR, normal RV size and systolic function.  - Echo (4/21): EF 35-40%, mild AI - CPX (5/21): severe COPD, mild-moderate HF limitation.  - Cardiolite (11/21): EF 47%, prior septal and apical infarction, no ischemia. - Echo (9/22): EF  35-40% with basal-mid septal akinesis and anterior hypokinesis, no evidence for residual VSD, mildly decreased RV systolic function.  - Echo (11/23): EF 40% with basal to mid anteroseptal and inferoseptal akinesis, mild RV dysfunction, PASP 33 mmHg.  8. PVCs: Zio patch 9/19 with 6% PVCs.  9. COVID-19 (4/22)  SH: Smoker, lives in [place], married.  FH: Mother with PAD, sister died at birth from congenital heart disease.   ROS: All systems reviewed and negative except as per HPI.   Current Outpatient Medications  Medication Sig Dispense Refill   albuterol (PROVENTIL) (2.5 MG/3ML) 0.083% nebulizer solution TAKE 3 MLS (1 VIAL) BY NEBULIZATION EVERY 6 HOURS AS NEEDED FOR WHEEZING OR SHORTNESS OF BREATH 90 mL 5   albuterol (VENTOLIN HFA) 108 (90 Base) MCG/ACT inhaler INHALE 2 PUFFS INTO THE LUNGS EVERY 6 HOURS AS NEEDED FOR WHEEZING OR SHORTNESS OF BREATH 8.5 g 5   ALPRAZolam (XANAX) 0.5 MG tablet 1 tab by mouth twice per day as needed 60 tablet 5   aspirin 81 MG tablet Take 1 tablet (81 mg total) by mouth daily. 100 tablet 99   bisoprolol (ZEBETA) 5 MG tablet TAKE 1/2 TABLET (2.5 MG TOTAL) BY MOUTH DAILY 45 tablet 3   chlorpheniramine-HYDROcodone (TUSSIONEX) 10-8 MG/5ML Take 5 mLs by mouth every 12 (twelve) hours as needed for cough. 115 mL 0   digoxin (LANOXIN) 0.125 MG tablet Take 0.5 tablets (62.5 mcg total) by mouth daily. 45 tablet 3   doxycycline (VIBRAMYCIN) 100 MG capsule Take 1 tablet by mouth twice daily for 7 days. 14 capsule 0   furosemide (LASIX) 20 MG tablet Take 1 tablet (20 mg total) by mouth daily as needed. 90 tablet 1   HYDROcodone-acetaminophen (NORCO/VICODIN) 5-325 MG tablet Take 1 tablet by mouth every 6 (six) hours as needed for moderate pain. 30 tablet 0   ibuprofen (ADVIL,MOTRIN) 200 MG tablet Take 200 mg by mouth daily as needed for headache.     mometasone (ELOCON) 0.1 % cream Apply 1 Application topically daily as needed (Rash). 45 g 0   predniSONE (DELTASONE) 5 MG  tablet Take 1 tablet (5 mg total) by mouth daily with breakfast. 30 tablet 5   rosuvastatin (CRESTOR) 10 MG tablet TAKE 1 TABLET BY MOUTH DAILY 90 tablet 2   sacubitril-valsartan (ENTRESTO) 24-26 MG Take 1 tablet by mouth 2 (two) times daily.     spironolactone (ALDACTONE) 25 MG tablet Take 1 tablet (25 mg total) by mouth at bedtime. 30 tablet 3   Tiotropium Bromide-Olodaterol (STIOLTO RESPIMAT) 2.5-2.5 MCG/ACT AERS Inhale 2 puffs into the lungs daily. 4 g 5   Tiotropium Bromide-Olodaterol (STIOLTO RESPIMAT) 2.5-2.5 MCG/ACT AERS Inhale 2 puffs into the lungs daily. 4 g 11   tiZANidine (ZANAFLEX) 2 MG tablet Take 1 tablet (2 mg total) by mouth every 6 (six) hours as needed for muscle spasms. 40 tablet 1   No current facility-administered medications for this visit.   There were no vitals taken for this visit.  Wt Readings from Last 3 Encounters:  07/30/22 106 lb (48.1 kg)  07/09/22 107  lb 12.8 oz (48.9 kg)  06/25/22 105 lb 9.6 oz (47.9 kg)   General: NAD Neck: No JVD, no thyromegaly or thyroid nodule.  Lungs: Occasional rhonchi CV: Nondisplaced PMI.  Heart regular S1/S2, no S3/S4, no murmur.  No peripheral edema.  No carotid bruit.  Normal pedal pulses.  Abdomen: Soft, nontender, no hepatosplenomegaly, no distention.  Skin: Intact without lesions or rashes.  Neurologic: Alert and oriented x 3.  Psych: Normal affect. Extremities: No clubbing or cyanosis.  HEENT: Normal.   Assessment/Plan: 1.  PAD: She had endovascular intervention to occluded right CFA in 11/18, but subsequent arterial showed in-stent restenosis. She has occluded left CFA also that was not intervened on. Minimal claudication.  She has finally quit smoking.  Follows with Dr. Kirke Corin for PV with plan for medical management unless claudication worsens.  - Continue statin.  2. CAD: Nonobstructive on 10/18 cath. Cardiolite in 11/21 with no evidence of ischemia. She has occasional atypical chest pain.  - She is on ASA 81 and  statin.  3. Hyperlipidemia: Continue crestor, good lipids in 7/24  4. COPD: Severe by PFTs on 5/21 CPX. COPD plays a significant role in her dyspnea. She has finally quit smoking.  Follows with pulmonary.  5. Chronic systolic CHF: Nonischemic cardiomyopathy.  RHC/LHC in 10/18 with no nonobstructive coronary disease but CI was very low at 1.33.  Her low cardiac output was out of proportion to her symptoms.  [name] CRT-D device.  CPX in 5/21 showed severe COPD but only mild-moderate HF limitation.   Echo with EF stable at 35-40% in 9/22 and EF 40% on 11/23 echo.  Not volume overloaded on exam or by Heart[facility]. NYHA class II symptoms, probably in large part due to COPD.     - Takes Lasix prn.   - Continue Entresto 24/26 bid.  - Continue bisoprolol 2.5 mg daily.  - Continue spironolactone 25 mg daily.  - Continue digoxin, check level today.  - Unable to tolerate [place].  - I will arrange for echo.   Followup 4 months   [name] 08/03/2022

## 2022-08-06 ENCOUNTER — Ambulatory Visit: Payer: Medicare Other | Admitting: Dermatology

## 2022-08-18 ENCOUNTER — Telehealth: Payer: Self-pay | Admitting: Nurse Practitioner

## 2022-08-18 ENCOUNTER — Other Ambulatory Visit: Payer: Self-pay

## 2022-08-18 ENCOUNTER — Ambulatory Visit: Payer: Medicare Other

## 2022-08-18 DIAGNOSIS — F17210 Nicotine dependence, cigarettes, uncomplicated: Secondary | ICD-10-CM

## 2022-08-18 LAB — PULMONARY FUNCTION TEST
DL/VA % pred: 61 %
DL/VA: 2.6 ml/min/mmHg/L
DLCO cor % pred: 51 %
DLCO cor: 8.81 ml/min/mmHg
DLCO unc % pred: 51 %
DLCO unc: 8.86 ml/min/mmHg
FEF 25-75 Post: 0.32 L/sec
FEF 25-75 Pre: 0.28 L/sec
FEF2575-%Change-Post: 13 %
FEF2575-%Pred-Post: 21 %
FEF2575-%Pred-Pre: 19 %
FEV1-%Change-Post: 3 %
FEV1-%Pred-Post: 36 %
FEV1-%Pred-Pre: 35 %
FEV1-Post: 0.67 L
FEV1-Pre: 0.65 L
FEV1FVC-%Change-Post: -3 %
FEV1FVC-%Pred-Pre: 63 %
FEV6-%Change-Post: 6 %
FEV6-%Pred-Post: 60 %
FEV6-%Pred-Pre: 56 %
FEV6-Post: 1.41 L
FEV6-Pre: 1.32 L
FEV6FVC-%Change-Post: 0 %
FEV6FVC-%Pred-Post: 101 %
FEV6FVC-%Pred-Pre: 102 %
FVC-%Change-Post: 7 %
FVC-%Pred-Post: 59 %
FVC-%Pred-Pre: 55 %
FVC-Post: 1.46 L
FVC-Pre: 1.36 L
Post FEV1/FVC ratio: 46 %
Post FEV6/FVC ratio: 96 %
Pre FEV1/FVC ratio: 48 %
Pre FEV6/FVC Ratio: 97 %
RV % pred: 280 %
RV: 6 L
TLC % pred: 163 %
TLC: 7.54 L

## 2022-08-18 NOTE — Patient Instructions (Signed)
Full PFT performed today. °

## 2022-08-18 NOTE — Progress Notes (Signed)
Full PFT performed today. °

## 2022-08-18 NOTE — Telephone Encounter (Signed)
Pt would like to know if she can be weened off of the 5 mil of predinsone 5mg 

## 2022-08-19 ENCOUNTER — Other Ambulatory Visit (HOSPITAL_COMMUNITY): Payer: Self-pay | Admitting: Family Medicine

## 2022-08-19 NOTE — Progress Notes (Signed)
PFTs relatively stable when compared to 5 years ago. She still has severe COPD with a moderate diffusing defect. Thanks.

## 2022-08-20 NOTE — Telephone Encounter (Signed)
Recommend she remain on it until she is seen back at her follow up. If she has been stable at this point without any exacerbations, can discuss weaning her off of it. Thanks!

## 2022-08-20 NOTE — Telephone Encounter (Signed)
Patient was supposed to have a f/u with you tomorrow but was canceled by mistake. I have her scheduled to see you 9/4-soonest apt.  She states she had one exacerbation last week due to the humidity, but over doing pretty well for the most part

## 2022-08-20 NOTE — Telephone Encounter (Signed)
Kimberly [name]  Will patient be on prednisone long term? Or will she eventually be weaned off? Patient would like to know please advise

## 2022-08-21 ENCOUNTER — Ambulatory Visit: Payer: Medicare Other | Admitting: Nurse Practitioner

## 2022-08-21 NOTE — Telephone Encounter (Signed)
Would recommend she stay on the prednisone then. Thanks!

## 2022-08-22 NOTE — Telephone Encounter (Signed)
Patient is aware to remain on prednisone until next apt. Closing encounter nfn

## 2022-08-25 ENCOUNTER — Ambulatory Visit: Payer: Medicare Other | Admitting: Dermatology

## 2022-08-26 ENCOUNTER — Other Ambulatory Visit
Admission: RE | Admit: 2022-08-26 | Discharge: 2022-08-26 | Disposition: A | Discharge status: Home / Self Care | Payer: Medicare Other | Attending: Cardiology | Admitting: Cardiology

## 2022-08-26 DIAGNOSIS — I5022 Chronic systolic (congestive) heart failure: Secondary | ICD-10-CM | POA: Insufficient documentation

## 2022-08-26 LAB — DIGOXIN LEVEL: Digoxin Level: 0.9 ng/mL (ref 0.8–2.0)

## 2022-08-26 LAB — BRAIN NATRIURETIC PEPTIDE: B Natriuretic Peptide: 347.5 pg/mL — ABNORMAL HIGH (ref 0.0–100.0)

## 2022-09-02 ENCOUNTER — Ambulatory Visit: Payer: Medicare Other

## 2022-09-09 NOTE — Progress Notes (Signed)
This encounter was created in error - please disregard. Called patient for visit and she informed me that she will have to reschedule due to her husband just coming back home from the hospital.  Patient said that she call office at some point to reschedule visit.

## 2022-09-15 LAB — CUP PACEART REMOTE DEVICE CHECK
Battery Remaining Longevity: 66 mo
Battery Remaining Percentage: 77 %
Brady Statistic RA Percent Paced: 49 %
Brady Statistic RV Percent Paced: 90 %
Date Time Interrogation Session: 20240826041100
HighPow Impedance: 76 Ohm
Implantable Lead Connection Status: 753985
Implantable Lead Connection Status: 753985
Implantable Lead Connection Status: 753985
Implantable Lead Implant Date: 20190226
Implantable Lead Implant Date: 20190226
Implantable Lead Implant Date: 20190226
Implantable Lead Location: 753858
Implantable Lead Location: 753859
Implantable Lead Location: 753860
Implantable Lead Model: 292
Implantable Lead Model: 4671
Implantable Lead Model: 7740
Implantable Lead Serial Number: 444536
Implantable Lead Serial Number: 726363
Implantable Lead Serial Number: 805938
Implantable Pulse Generator Implant Date: 20190226
Lead Channel Impedance Value: 438 Ohm
Lead Channel Impedance Value: 538 Ohm
Lead Channel Impedance Value: 767 Ohm
Lead Channel Setting Pacing Amplitude: 2 V
Lead Channel Setting Pacing Amplitude: 2.5 V
Lead Channel Setting Pacing Amplitude: 2.6 V
Lead Channel Setting Pacing Pulse Width: 0.4 ms
Lead Channel Setting Pacing Pulse Width: 0.4 ms
Lead Channel Setting Sensing Sensitivity: 0.5 mV
Lead Channel Setting Sensing Sensitivity: 1 mV
Pulse Gen Serial Number: 204176
Zone Setting Status: 755011

## 2022-09-17 ENCOUNTER — Other Ambulatory Visit: Payer: Self-pay

## 2022-09-17 ENCOUNTER — Ambulatory Visit (INDEPENDENT_AMBULATORY_CARE_PROVIDER_SITE_OTHER): Payer: Medicare Other

## 2022-09-17 DIAGNOSIS — I420 Dilated cardiomyopathy: Secondary | ICD-10-CM

## 2022-09-17 MED ORDER — TIZANIDINE HCL 2 MG PO TABS: 2.0000 mg | ORAL_TABLET | Freq: Four times a day (QID) | ORAL | 1 refills | Status: DC | PRN

## 2022-09-18 ENCOUNTER — Other Ambulatory Visit: Payer: Self-pay | Admitting: Cardiology

## 2022-09-24 ENCOUNTER — Ambulatory Visit: Payer: Medicare Other | Admitting: Nurse Practitioner

## 2022-09-24 ENCOUNTER — Encounter: Payer: Self-pay | Admitting: Nurse Practitioner

## 2022-09-24 VITALS — BP 90/60 | HR 58 | Ht 61.0 in | Wt 107.6 lb

## 2022-09-24 DIAGNOSIS — J449 Chronic obstructive pulmonary disease, unspecified: Secondary | ICD-10-CM

## 2022-09-24 DIAGNOSIS — Z87891 Personal history of nicotine dependence: Secondary | ICD-10-CM

## 2022-09-24 NOTE — Patient Instructions (Addendum)
Continue Stiolto 2 puffs daily. Continue Albuterol inhaler 2 puffs or 3 mL neb every 6 hours as needed for shortness of breath or wheezing. Notify if symptoms persist despite rescue inhaler/neb use.  Continue Prednisone 5 mg daily. Take in AM with food    Follow up 3 months with Dr. Sherene Sires or Katie Corita Allinson,NP. If symptoms do not improve or worsen, please contact office for sooner follow up or seek emergency care.

## 2022-09-24 NOTE — Progress Notes (Signed)
@Patient  ID: Kimberly [name], female    DOB: 18-Jul-1945, 77 y.o.   MRN: [id number]  Chief Complaint  Patient presents with   Follow-up    Pt is here for COPD F/U visit.     Referring provider: Corwin Levins, MD  HPI: 77 year old female, active smoker followed for severe COPD.  She is a patient of Dr. Thurston Hole and last seen in office 07/09/2022 by Allison Quarry NP.  Past medical history significant for PVD, dilated cardiomyopathy with ICD, SVT, CHF, VSD status postrepair x2, hypertension, allergic rhinitis, GERD, cirrhosis, anxiety, depression, HLD.  TEST/EVENTS:  01/12/2017 PFTs: FVC 55, FEV1 37, ratio 54, TLC 93, DLCOunc 47. 05/26/2019 cardiopulmonary stress test: Submaximal, moderate HF limitation but more significant pulmonary limitation 09/26/2020 echocardiogram: EF 35-40 percent.  No definite residual VSD.  G1 DD.  RV function mildly reduced.  Normal PASP.  Trivial MR. 05/12/2021 CTA chest: No evidence of PE.  Mild cardiomegaly.  Atherosclerosis present.  There are scattered tree-in-bud opacities within the right lower lobe, likely infection.  Moderate centrilobular emphysema is present.  Nodular hepatic contour, consistent with cirrhosis. 07/29/2021 CXR 2 view: Both lungs are clear.  Lungs are hyperinflated with mild biapical pleural thickening which is unchanged.  No acute process. 04/24/2022 CXR: hyperinflation. No acute process.  08/18/2022 PFT: FVC 59, FEV1 35, ratio 46, TLC 163, DLCO 51. Severe obstructive disease without reversibility. Moderate diffusing defect   07/27/2020: OV with Dr. Sherene Sires.  Breathing overall doing well.  Using albuterol around 2 times a day.  Sometimes has a nocturnal cough.  Maintained on Breztri.  Reeducated on albuterol as rescue medicine.  Recommended lung cancer screening CTs to set up through PCP.  Encourage smoking cessation.  08/08/2021: OV with Alithia Zavaleta NP for acute visit.  She was recently treated by her PCP for AECOPD with prednisone burst and empiric doxycycline course, which  she has completed.  She reported some low grade fevers but chest x-ray was clear.  She had apparently stopped her Markus Daft and was advised to resume use.  Today, she reports that she is feeling better from last week. She is still a little more short of breath when compared to her baseline. Went to [employer] yesterday and had to stop a few times to catch her breath. Her cough is persistent and congested but less productive and sputum is no longer purulent. She has noticed an occasional wheeze. She was using her rescue inhaler multiple times a day last week. Has only had to use it 1-2 times over the past few days. She denies any recurrent fevers, chills, hemoptysis, orthopnea, PND, leg swelling. She is back on [place], feels like it is helping. She is still smoking but is down to 1-4 cigarettes a day. Dr. Jonny Ruiz had started her on Wellbutrin, which is helping her quit. Extended treatment for slow to resolve AECOPD with prednisone taper. Rx sent for her to have nebulizer available.   09/16/2021: OV with Bresha Hosack NP for follow-up after being treated for persistent AECOPD.  She reports that her breathing has been doing better since last time she was here.  Feels like she recovered well.  Still has some occasional shortness of breath with exertion, but this is back to her baseline.  She also has a daily, congested cough which is normal for her.  This had gotten a little bit better when she had cut back on smoking.  Unfortunately, her husband had some health issues and difficulties recovering after a recent back surgery.  This  is caused a significant amount of stress on her so she had started smoking more again.  She is currently back down to close to 7-8 cigarettes a day, maybe less on some days. She wants to quit. Doesn't feel like the wellbutrin helped so she has stopped this. She denies any increased wheezing, hemoptysis, fevers, anorexia, weight loss, leg swelling, orthopnea. She continues on [place] [employer] daily. She uses her  nebulizer twice a day, prior to her [place]. Uses her rescue inhaler 1-2 times a week.   06/25/2022: OV with Vela Render NP for overdue follow up. She has been having some more trouble with her breathing over the past few months. She was seen in the ED February 2024 for bronchitis and shingles. She was treated with doxycycline and acyclovir. She felt somewhat better but then had worsening DOE and productive cough towards the end of March. She was seen by her PCP 3/28 and treated with levaquin course and prednisone. She failed to improve and went to the ED on 04/24/2022. Imaging without superimposed infection. She was treated with high dosed steroids but was unable to tolerate these so she stopped them after a few days. She was still struggling with a cough when she saw her cardiologist on 4/15 so she was treated with an additional course of doxycycline. She tells me she has been feeling better since then. She still has a cough throughout the day but her phlegm is minimal and clear. She is noticing some more wheezing. Breathing feels like it is getting back to her baseline but still gets more short winded with more exerting activities. She feels like she's had a decline over the last year or so. She denies any fevers, chills, hemoptysis, leg swelling, orthopnea, palpitations. She is only on Arunity right now. She does not like it because of the dry powder and having to always clear her mouth out afterwards. She's not sure it does much for her. She feels the albuterol inhaler and neb help more so she's using these a few times a day. She quit smoking over a month ago. Still struggling with cravings but focused on remaining smoke free.   07/09/2022: OV with Hosam Mcfetridge NP for follow up. She is feeling much better. She completed the steroids two days ago. She does feel like her breathing has been a little more short since coming off of it. She finds the steroids give her much more stamina and she's able to do things easier without  having to sit to rest to catch her breath as well. She hasn't noticed any wheezing. Her cough is better, minimal clear phlegm. Denies any fevers, chills, hemoptysis, leg swelling, orthopnea. She would like to see about staying on prednisone since she has had so much trouble over the last year. Remains smoke free. Using [employer] - this is $47, which is affordable but she would like to shop around to see if she can get it cheaper.   09/24/2022: Today - follow up Patient presents today for follow up. She was started on low dose daily steroids, 5 mg prednisone, after our last visit. She has been doing well on this. Has not had any flare ups since this time. She occasionally has trouble when she gets stressed out and feels like her breathing becomes more difficult but usually resolves once she calms down. She's not having any current difficulties. She has an occasional cough with clear phlegm. Not much wheezing. No fevers, chills, night sweats, hemoptysis, weight loss, anorexia. She is using [employer]  daily. Using albuterol a few times a week.   Allergies  Allergen Reactions   Mupirocin Shortness Of Breath and Other (See Comments)    Burning, pain, swelling and sob   Codeine Nausea Only    Immunization History  Administered Date(s) Administered   Fluad Quad(high Dose 65+) 11/27/2021   Influenza Split 10/15/2020   Influenza, High Dose Seasonal PF 09/30/2018   Influenza,inj,Quad PF,6+ Mos 10/17/2015   Influenza,inj,quad, With Preservative 11/17/2016   Influenza-Unspecified 09/20/2013, 10/13/2017, 09/30/2018, 10/13/2019, 10/13/2019   PFIZER(Purple Top)SARS-COV-2 Vaccination 03/13/2019, 04/05/2019   Pneumococcal Conjugate-13 02/20/2010, 11/23/2012   Pneumococcal Polysaccharide-23 10/27/2017   Tdap 02/20/2009    Past Medical History:  Diagnosis Date   AICD (automatic cardioverter/defibrillator) present 03/17/2017   Anxiety    Atrial tachycardia    Basal cell carcinoma    R ant thigh, L pretibia,  both txted at [facility]   Bursitis of shoulder, right, adhesive    Cancer (HCC)    CHF (congestive heart failure) (HCC)    Chronic bronchitis (HCC)    "1-2 times/yr" (01/23/2014)   COPD (chronic obstructive pulmonary disease) (HCC)    CVD (cerebrovascular disease)    Dyslipidemia    Dysrhythmia    Frequency of urination    GERD (gastroesophageal reflux disease)    Heart murmur    History of stomach ulcers    HTN (hypertension) 02/22/2011   Migraines    "stopped many years ago" (06/14/2014)   Osteoporosis 08/19/2016   Pericarditis    Pneumonia "10 times" (06/14/2014)   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)    Silent myocardial infarction (HCC) "late 1990's"   Stress incontinence    "was suppose to have been tacked up years ago but I didn't do it"   Syncope, near    Associated with atrial tachycardia-event recorder 1/16   Thoracic outlet syndrome    VSD (ventricular septal defect)     Tobacco History: Social History   Tobacco Use  Smoking Status Former   Current packs/day: 0.50   Average packs/day: 0.5 packs/day for 35.0 years (17.5 ttl pk-yrs)   Types: Cigarettes  Smokeless Tobacco Never  Tobacco Comments   3-4 cigarettes daily. Trying to quit. HS   Counseling given: Not Answered Tobacco comments: 3-4 cigarettes daily. Trying to quit. HS   Outpatient Medications Prior to Visit  Medication Sig Dispense Refill   albuterol (PROVENTIL) (2.5 MG/3ML) 0.083% nebulizer solution TAKE 3 MLS (1 VIAL) BY NEBULIZATION EVERY 6 HOURS AS NEEDED FOR WHEEZING OR SHORTNESS OF BREATH 90 mL 5   albuterol (VENTOLIN HFA) 108 (90 Base) MCG/ACT inhaler INHALE 2 PUFFS INTO THE LUNGS EVERY 6 HOURS AS NEEDED FOR WHEEZING OR SHORTNESS OF BREATH 8.5 g 5   ALPRAZolam (XANAX) 0.5 MG tablet 1 tab by mouth twice per day as needed 60 tablet 5   aspirin 81 MG tablet Take 1 tablet (81 mg total) by mouth daily. 100 tablet 99   bisoprolol (ZEBETA) 5 MG tablet  TAKE 1/2 TABLET (2.5 MG TOTAL) BY MOUTH DAILY 45 tablet 3   chlorpheniramine-HYDROcodone (TUSSIONEX) 10-8 MG/5ML Take 5 mLs by mouth every 12 (twelve) hours as needed for cough. 115 mL 0   digoxin (LANOXIN) 0.125 MG tablet TAKE 1/2 TABLET (62.5 MCG TOTAL) BY MOUTH DAILY 45 tablet 0   furosemide (LASIX) 20 MG tablet Take 1 tablet (20 mg total) by mouth daily as needed. 90 tablet 1   HYDROcodone-acetaminophen (NORCO/VICODIN) 5-325 MG tablet Take 1 tablet by mouth every  6 (six) hours as needed for moderate pain. 30 tablet 0   ibuprofen (ADVIL,MOTRIN) 200 MG tablet Take 200 mg by mouth daily as needed for headache.     mometasone (ELOCON) 0.1 % cream Apply 1 Application topically daily as needed (Rash). 45 g 0   predniSONE (DELTASONE) 5 MG tablet Take 1 tablet (5 mg total) by mouth daily with breakfast. 30 tablet 5   rosuvastatin (CRESTOR) 10 MG tablet TAKE 1 TABLET BY MOUTH DAILY 90 tablet 2   sacubitril-valsartan (ENTRESTO) 24-26 MG Take 1 tablet by mouth 2 (two) times daily.     spironolactone (ALDACTONE) 25 MG tablet TAKE 1 TABLET BY MOUTH AT BEDTIME 30 tablet 3   Tiotropium Bromide-Olodaterol (STIOLTO RESPIMAT) 2.5-2.5 MCG/ACT AERS Inhale 2 puffs into the lungs daily. 4 g 5   Tiotropium Bromide-Olodaterol (STIOLTO RESPIMAT) 2.5-2.5 MCG/ACT AERS Inhale 2 puffs into the lungs daily. 4 g 11   tiZANidine (ZANAFLEX) 2 MG tablet Take 1 tablet (2 mg total) by mouth every 6 (six) hours as needed for muscle spasms. 40 tablet 1   doxycycline (VIBRAMYCIN) 100 MG capsule Take 1 tablet by mouth twice daily for 7 days. 14 capsule 0   No facility-administered medications prior to visit.     Review of Systems:   Constitutional: No weight loss or gain, night sweats, fevers, chills, fatigue, or lassitude. HEENT: No headaches, difficulty swallowing, tooth/dental problems, or sore throat. No sneezing, itching, ear ache, nasal congestion, or post nasal drip CV:  No chest pain, orthopnea, PND, swelling in lower  extremities, anasarca, dizziness, palpitations, syncope Resp: +shortness of breath with exertion; minimally productive cough (baseline). No wheeze. No hemoptysis.  No chest wall deformity GI:  No heartburn, indigestion, abdominal pain, nausea, vomiting, diarrhea, change in bowel habits, loss of appetite, bloody stools.  Skin: No rash, lesions, ulcerations MSK:  No joint pain or swelling.   Neuro: No dizziness or lightheadedness.  Psych: No depression or anxiety. Mood stable.     Physical Exam:  BP 90/60 (BP Location: Left Arm, Cuff Size: Normal)   Pulse (!) 58   Ht 5\' 1"  (1.549 m)   Wt 107 lb 9.6 oz (48.8 kg)   SpO2 96%   BMI 20.33 kg/m   GEN: Pleasant, interactive, well-appearing; in no acute distress. HEENT:  Normocephalic and atraumatic. PERRLA. Sclera white. Nasal turbinates pink, moist and patent bilaterally. No rhinorrhea present. Oropharynx pink and moist, without exudate or edema. No lesions, ulcerations, or postnasal drip.  NECK:  Supple w/ fair ROM. No JVD present. Normal carotid impulses w/o bruits. Thyroid symmetrical with no goiter or nodules palpated. No lymphadenopathy.   CV: RRR, no m/r/g, no peripheral edema. Pulses intact, +2 bilaterally. No cyanosis, pallor or clubbing. PULMONARY:  Unlabored, regular breathing. Diminished bilaterally A&P w/o wheezes/rales/rhonchi. No accessory muscle use. No dullness to percussion. GI: BS present and normoactive. Soft, non-tender to palpation. No organomegaly or masses detected.  MSK: No erythema, warmth or tenderness. Cap refil <2 sec all extrem. No deformities or joint swelling noted.  Neuro: A/Ox3. No focal deficits noted.   Skin: Warm, no lesions or rashe Psych: Normal affect and behavior. Judgement and thought content appropriate.     Lab Results:  CBC    Component Value Date/Time   WBC 8.5 07/30/2022 1113   RBC 4.51 07/30/2022 1113   HGB 13.6 07/30/2022 1113   HGB 13.9 10/06/2016 1418   HCT 42.0 07/30/2022 1113    HCT 42.1 10/06/2016 1418   PLT 262.0 07/30/2022  1113   PLT 273 10/06/2016 1418   MCV 93.2 07/30/2022 1113   MCV 87 10/06/2016 1418   MCH 29.6 04/24/2022 1055   MCHC 32.3 07/30/2022 1113   RDW 15.7 (H) 07/30/2022 1113   RDW 13.8 10/06/2016 1418   LYMPHSABS 1.2 07/30/2022 1113   LYMPHSABS 1.7 01/25/2013 0908   MONOABS 0.5 07/30/2022 1113   EOSABS 0.2 07/30/2022 1113   EOSABS 0.0 01/25/2013 0908   BASOSABS 0.1 07/30/2022 1113   BASOSABS 0.1 01/25/2013 0908    BMET    Component Value Date/Time   NA 140 07/30/2022 1113   NA 140 05/27/2021 1103   K 4.9 07/30/2022 1113   CL 102 07/30/2022 1113   CO2 30 07/30/2022 1113   GLUCOSE 102 (H) 07/30/2022 1113   BUN 20 07/30/2022 1113   BUN 28 (H) 05/27/2021 1103   CREATININE 1.05 07/30/2022 1113   CREATININE 0.81 11/02/2015 1251   CALCIUM 9.7 07/30/2022 1113   GFRNONAA 57 (L) 05/05/2022 1418   GFRAA >60 08/09/2019 1038    BNP    Component Value Date/Time   BNP 347.5 (H) 08/26/2022 0755     Imaging:  CUP PACEART REMOTE DEVICE CHECK  Result Date: 09/15/2022 Scheduled remote reviewed. Normal device function.  2 NSVT, no therapy Next remote 91 days. LA, CVRS   Administration History     None          Latest Ref Rng & Units 08/18/2022    7:59 AM 01/12/2017    9:38 AM  PFT Results  FVC-Pre L 1.36  1.46   FVC-Predicted Pre % 55  55   FVC-Post L 1.46  1.64   FVC-Predicted Post % 59  61   Pre FEV1/FVC % % 48  52   Post FEV1/FCV % % 46  54   FEV1-Pre L 0.65  0.76   FEV1-Predicted Pre % 35  37   FEV1-Post L 0.67  0.88   DLCO uncorrected ml/min/mmHg 8.86  9.68   DLCO UNC% % 51  47   DLCO corrected ml/min/mmHg 8.81    DLCO COR %Predicted % 51    DLVA Predicted % 61  78   TLC L 7.54  4.30   TLC % Predicted % 163  93   RV % Predicted % 280  134     No results found for: "NITRICOXIDE"      Assessment & Plan:   COPD, severe (HCC) Severe COPD, prone to recurrent exacerbations with numerous steroid/abx courses  over the last 12 months. Low dose daily steroids were started June 2024. She has not had an exacerbation since. She will continue LABA/LAMA therapy. She did not tolerate Breztri or Arunity in the past so currently avoiding ICS. Action plan in place. Graded exercises encouraged. Counseled to remain smoke free.   Patient Instructions  Continue Stiolto 2 puffs daily. Continue Albuterol inhaler 2 puffs or 3 mL neb every 6 hours as needed for shortness of breath or wheezing. Notify if symptoms persist despite rescue inhaler/neb use.  Continue Prednisone 5 mg daily. Take in AM with food    Follow up 3 months with Dr. Sherene Sires or Katie Daxton Nydam,NP. If symptoms do not improve or worsen, please contact office for sooner follow up or seek emergency care.    Former cigarette smoker Former heavy smoker. Counseled to remain smoke free. Will follow up on referral to lung cancer screening program that was placed 6/5.    I spent 28 minutes of dedicated to the  care of this patient on the date of this encounter to include pre-visit review of records, face-to-face time with the patient discussing conditions above, post visit ordering of testing, clinical documentation with the electronic health record, making appropriate referrals as documented, and communicating necessary findings to members of the patients care team.  Noemi Chapel, NP 09/24/2022  Pt aware and understands NP's role.

## 2022-09-24 NOTE — Assessment & Plan Note (Signed)
Severe COPD, prone to recurrent exacerbations with numerous steroid/abx courses over the last 12 months. Low dose daily steroids were started June 2024. She has not had an exacerbation since. She will continue LABA/LAMA therapy. She did not tolerate Breztri or Arunity in the past so currently avoiding ICS. Action plan in place. Graded exercises encouraged. Counseled to remain smoke free.   Patient Instructions  Continue Stiolto 2 puffs daily. Continue Albuterol inhaler 2 puffs or 3 mL neb every 6 hours as needed for shortness of breath or wheezing. Notify if symptoms persist despite rescue inhaler/neb use.  Continue Prednisone 5 mg daily. Take in AM with food    Follow up 3 months with Dr. Sherene Sires or Katie Suriah Peragine,NP. If symptoms do not improve or worsen, please contact office for sooner follow up or seek emergency care.

## 2022-09-24 NOTE — Assessment & Plan Note (Signed)
Former heavy smoker. Counseled to remain smoke free. Will follow up on referral to lung cancer screening program that was placed 6/5.

## 2022-09-26 NOTE — Progress Notes (Signed)
Remote ICD transmission.   

## 2022-10-13 ENCOUNTER — Other Ambulatory Visit: Payer: Self-pay | Admitting: *Deleted

## 2022-10-13 DIAGNOSIS — Z87891 Personal history of nicotine dependence: Secondary | ICD-10-CM

## 2022-10-13 DIAGNOSIS — Z122 Encounter for screening for malignant neoplasm of respiratory organs: Secondary | ICD-10-CM

## 2022-10-17 ENCOUNTER — Telehealth: Payer: Self-pay

## 2022-10-17 NOTE — Telephone Encounter (Signed)
Spoke with patient to remind her of her ECHOCARDIOGRAM on 9/30, and f/u appt with Dr. Shirlee Latch on 10/1. Pt aware, agreeable, and verbalized understanding

## 2022-10-20 ENCOUNTER — Ambulatory Visit
Admission: RE | Admit: 2022-10-20 | Discharge: 2022-10-20 | Disposition: A | Discharge status: Home / Self Care | Payer: Medicare Other | Source: Ambulatory Visit | Attending: Cardiology | Admitting: Cardiology

## 2022-10-20 DIAGNOSIS — I5022 Chronic systolic (congestive) heart failure: Secondary | ICD-10-CM | POA: Diagnosis not present

## 2022-10-20 DIAGNOSIS — Z95810 Presence of automatic (implantable) cardiac defibrillator: Secondary | ICD-10-CM | POA: Diagnosis not present

## 2022-10-20 DIAGNOSIS — J449 Chronic obstructive pulmonary disease, unspecified: Secondary | ICD-10-CM | POA: Diagnosis not present

## 2022-10-20 DIAGNOSIS — I3489 Other nonrheumatic mitral valve disorders: Secondary | ICD-10-CM | POA: Insufficient documentation

## 2022-10-20 DIAGNOSIS — I358 Other nonrheumatic aortic valve disorders: Secondary | ICD-10-CM | POA: Diagnosis not present

## 2022-10-20 LAB — ECHOCARDIOGRAM COMPLETE
AR max vel: 1.99 cm2
AV Area VTI: 1.81 cm2
AV Area mean vel: 1.86 cm2
AV Mean grad: 4.3 mm[Hg]
AV Peak grad: 7.5 mm[Hg]
Ao pk vel: 1.37 m/s
Area-P 1/2: 1.87 cm2
Calc EF: 32.6 %
Est EF: 40
MV VTI: 1.01 cm2
S' Lateral: 3.7 cm
Single Plane A2C EF: 49.9 %
Single Plane A4C EF: 3 %

## 2022-10-20 NOTE — Progress Notes (Signed)
*  PRELIMINARY RESULTS* Echocardiogram 2D Echocardiogram has been performed.  Cristela Blue 10/20/2022, 11:37 AM

## 2022-10-21 ENCOUNTER — Ambulatory Visit: Payer: Medicare Other | Attending: Cardiology | Admitting: Cardiology

## 2022-10-21 VITALS — BP 95/40 | HR 58 | Wt 112.0 lb

## 2022-10-21 DIAGNOSIS — Z7952 Long term (current) use of systemic steroids: Secondary | ICD-10-CM | POA: Insufficient documentation

## 2022-10-21 DIAGNOSIS — I428 Other cardiomyopathies: Secondary | ICD-10-CM | POA: Diagnosis not present

## 2022-10-21 DIAGNOSIS — I2510 Atherosclerotic heart disease of native coronary artery without angina pectoris: Secondary | ICD-10-CM | POA: Insufficient documentation

## 2022-10-21 DIAGNOSIS — Z79899 Other long term (current) drug therapy: Secondary | ICD-10-CM | POA: Insufficient documentation

## 2022-10-21 DIAGNOSIS — I739 Peripheral vascular disease, unspecified: Secondary | ICD-10-CM | POA: Diagnosis not present

## 2022-10-21 DIAGNOSIS — I5022 Chronic systolic (congestive) heart failure: Secondary | ICD-10-CM | POA: Insufficient documentation

## 2022-10-21 DIAGNOSIS — R0609 Other forms of dyspnea: Secondary | ICD-10-CM | POA: Diagnosis not present

## 2022-10-21 DIAGNOSIS — E785 Hyperlipidemia, unspecified: Secondary | ICD-10-CM | POA: Diagnosis not present

## 2022-10-21 DIAGNOSIS — Z87891 Personal history of nicotine dependence: Secondary | ICD-10-CM | POA: Insufficient documentation

## 2022-10-21 DIAGNOSIS — Z7982 Long term (current) use of aspirin: Secondary | ICD-10-CM | POA: Diagnosis not present

## 2022-10-21 DIAGNOSIS — J449 Chronic obstructive pulmonary disease, unspecified: Secondary | ICD-10-CM | POA: Insufficient documentation

## 2022-10-21 NOTE — Patient Instructions (Signed)
  Lab Work:  Labs done today, your results will be available in MyChart, we will contact you for abnormal readings.  Special Instructions // Education:  Do the following things EVERYDAY: Weigh yourself in the morning before breakfast. Write it down and keep it in a log. Take your medicines as prescribed Eat low salt foods--Limit salt (sodium) to 2000 mg per day.  Stay as active as you can everyday Limit all fluids for the day to less than 2 liters   Follow-Up in: Please call in December to schedule your January appointment with Dr. Shirlee Latch.     If you have any questions or concerns before your next appointment please send [place] a message through [place] or call our office at [phone] Monday-Friday 8 am-5 pm.   If you have an urgent need after hours on the weekend please call your Primary Cardiologist or the [facility] in [place] at [phone].

## 2022-10-21 NOTE — Progress Notes (Signed)
PCP: Dr. Jonny Ruiz Cardiology: Dr. Tenny Craw HF Cardiology: Dr. Shirlee Latch  77 y.o.with history of VSD repair as a child, COPD/active smoking, PAD, and chronic systolic CHF.  Patient has a long history of cardiomyopathy.  She had VSD repaired as a child and imaging has not showed residual VSD.  Cardiac MRI in 11/09 showed EF 36%.  Echo in 12/15 showed EF 40-45%.  Echo in 3/18 showed EF down to 15-20% with moderate RV systolic dysfunction.  RHC/LHC in 10/18 showed nonobstructive coronary but was concerning for low output (CI 1.33).  She had a nonhealing right lower extremity wound.  Peripheral angiogram with bilateral CFA occlusions and underwent DCB/self expanding stent to to right CFA.    CPX in 1/19 showed moderate to severe functional limitation, combination of HF and lung disease, probably more related to the lung disease.  PFTs in 12/18 showed severe COPD.   She had a [employer] CRT-D device placed.    7/19 peripheral arterial dopplers showed significant in-stent restenosis right CFA stent.  Echo in 4/21 showed EF 35-40% with mild AI.  CPX in 5/21 showed severe COPD, mild-moderate HF limitation.  11/21 Cardiolite showed prior septal and apical infarction, no ischemia.   Had COVID April 2022. She has continued to recover.   Echo in 9/22 showed EF 35-40% with basal-mid septal akinesis and anterior hypokinesis, no evidence for residual VSD, mildly decreased RV systolic function.  Echo in 11/23 showed EF 40% with basal to mid anteroseptal and inferoseptal akinesis, mild RV dysfunction, PASP 33 mmHg.  Echo in 9/24 showed EF 40%, basal-mid inferoseptal and anteroseptal akinesis, normal RV.   She returns for followup of CHF. She is staying off cigarettes. Weight up 6 lbs, eating more since she quit smoking.  Stable breathing, no recent COPD flare.  She has been on chronic prednisone.  Dyspnea after walking about 100 yards.  No chest pain.  No orthopnea/PND.  She is having loose stool several times/day,  blames this on spironolactone. No significant claudication.   Labs (12/22): BNP 418, digoxin 0.5, K 5, creatinine 1.17 Labs (8/23): BNP 222, digoxin 0.7, LDL 68, K 4.4, creatinine 4.09 Labs (10/23): K 3.7, creatinine 0.96 Labs (4/24): hgb 14.1, K 4.2, creatinine 1.14, digoxin level 0.6 Labs (7/24): K 4.9, creatinine 1.05, hgb 13.6, LDL 68 Labs (8/24): digoxin 0.9, BNP 347  ECG (personally reviewed): A-BiV paced  PMH: 1. VSD: s/p repair at [facility] as child.  From description, sounds like muscular VSD.  2. Atrial tachycardia s/p ablation.  3. Hyperlipidemia 4. COPD: Quit smoking 2024 - PFTs (12/18): Severe obstructive lung disease.  5. Atrial fibrillation: Paroxysmal.   Noted only transiently.   6. PAD:  - Angiogram 11/18 showed totally occluded bilateral CFAs.  Patient had stent to right CFA.  ABIs in 12/18 were normal on right. - ABIs (10/20): ABI 0.8 right, 0.74 left - 12/21 dopplers with R CFA stent in-stenosis.  - ABIs (1/24): normal on right, 0.68 on left with occluded left CFA.  7. Chronic systolic CHF: Nonischemic cardiomyopathy.   - cMRI (11/09): EF 36%, VSD patch in anterior ventricular septum - Echo (12/15): EF 40-45% - Echo (3/18): EF 15-20%, moderate LV dilation, moderate MR, moderate RV dilation with moderately decreased systolic function.  - LHC/RHC (10/18): 3+ MR, EF < 25%, minimal nonobstructive CAD; PA 41/21, LVEDP 18, CI 1.33.  - CPX (1/19): peak VO2 13.4, VE/VCO2 slope 43, RER 1.07.  Mod-severe functional limitation due to combination of HF and lung disease, probably more  related to lung disease.  - [name] CRT-D device.  - Echo (7/19): EF 25-30%, mild LV dilation, mild MR, normal RV size and systolic function.  - Echo (4/21): EF 35-40%, mild AI - CPX (5/21): severe COPD, mild-moderate HF limitation.  - Cardiolite (11/21): EF 47%, prior septal and apical infarction, no ischemia. - Echo (9/22): EF 35-40% with basal-mid septal akinesis and anterior  hypokinesis, no evidence for residual VSD, mildly decreased RV systolic function.  - Echo (11/23): EF 40% with basal to mid anteroseptal and inferoseptal akinesis, mild RV dysfunction, PASP 33 mmHg.  - Echo (9/24): EF 40%, basal-mid inferoseptal and anteroseptal akinesis, normal RV.  8. PVCs: Zio patch 9/19 with 6% PVCs.  9. COVID-19 (4/22)  SH: Smoker, lives in [place], married.  FH: Mother with PAD, sister died at birth from congenital heart disease.   ROS: All systems reviewed and negative except as per HPI.   Current Outpatient Medications  Medication Sig Dispense Refill   albuterol (PROVENTIL) (2.5 MG/3ML) 0.083% nebulizer solution TAKE 3 MLS (1 VIAL) BY NEBULIZATION EVERY 6 HOURS AS NEEDED FOR WHEEZING OR SHORTNESS OF BREATH 90 mL 5   albuterol (VENTOLIN HFA) 108 (90 Base) MCG/ACT inhaler INHALE 2 PUFFS INTO THE LUNGS EVERY 6 HOURS AS NEEDED FOR WHEEZING OR SHORTNESS OF BREATH 8.5 g 5   ALPRAZolam (XANAX) 0.5 MG tablet 1 tab by mouth twice per day as needed 60 tablet 5   aspirin 81 MG tablet Take 1 tablet (81 mg total) by mouth daily. 100 tablet 99   bisoprolol (ZEBETA) 5 MG tablet TAKE 1/2 TABLET (2.5 MG TOTAL) BY MOUTH DAILY 45 tablet 3   chlorpheniramine-HYDROcodone (TUSSIONEX) 10-8 MG/5ML Take 5 mLs by mouth every 12 (twelve) hours as needed for cough. 115 mL 0   digoxin (LANOXIN) 0.125 MG tablet TAKE 1/2 TABLET (62.5 MCG TOTAL) BY MOUTH DAILY 45 tablet 0   furosemide (LASIX) 20 MG tablet Take 1 tablet (20 mg total) by mouth daily as needed. 90 tablet 1   HYDROcodone-acetaminophen (NORCO/VICODIN) 5-325 MG tablet Take 1 tablet by mouth every 6 (six) hours as needed for moderate pain. 30 tablet 0   ibuprofen (ADVIL,MOTRIN) 200 MG tablet Take 200 mg by mouth daily as needed for headache.     mometasone (ELOCON) 0.1 % cream Apply 1 Application topically daily as needed (Rash). 45 g 0   predniSONE (DELTASONE) 5 MG tablet Take 1 tablet (5 mg total) by mouth daily with breakfast. 30  tablet 5   rosuvastatin (CRESTOR) 10 MG tablet TAKE 1 TABLET BY MOUTH DAILY 90 tablet 2   sacubitril-valsartan (ENTRESTO) 24-26 MG Take 1 tablet by mouth 2 (two) times daily.     spironolactone (ALDACTONE) 25 MG tablet TAKE 1 TABLET BY MOUTH AT BEDTIME 30 tablet 3   Tiotropium Bromide-Olodaterol (STIOLTO RESPIMAT) 2.5-2.5 MCG/ACT AERS Inhale 2 puffs into the lungs daily. 4 g 5   Tiotropium Bromide-Olodaterol (STIOLTO RESPIMAT) 2.5-2.5 MCG/ACT AERS Inhale 2 puffs into the lungs daily. 4 g 11   tiZANidine (ZANAFLEX) 2 MG tablet Take 1 tablet (2 mg total) by mouth every 6 (six) hours as needed for muscle spasms. 40 tablet 1   No current facility-administered medications for this visit.   BP (!) 95/40   Pulse (!) 58   Wt 112 lb (50.8 kg)   SpO2 97%   BMI 21.16 kg/m   Wt Readings from Last 3 Encounters:  10/21/22 112 lb (50.8 kg)  09/24/22 107 lb 9.6 oz (48.8 kg)  07/30/22 106 lb (48.1 kg)   General: NAD Neck: No JVD, no thyromegaly or thyroid nodule.  Lungs: Occasional rhonchi. CV: Nondisplaced PMI.  Heart regular S1/S2, no S3/S4, no murmur.  No peripheral edema.  No carotid bruit.  Normal pedal pulses.  Abdomen: Soft, nontender, no hepatosplenomegaly, no distention.  Skin: Intact without lesions or rashes.  Neurologic: Alert and oriented x 3.  Psych: Normal affect. Extremities: No clubbing or cyanosis.  HEENT: Normal.   Assessment/Plan: 1.  PAD: She had endovascular intervention to occluded right CFA in 11/18, but subsequent arterial showed in-stent restenosis. She has occluded left CFA also that was not intervened on. Minimal claudication.  She has quit smoking.  Follows with Dr. Kirke Corin for PV with plan for medical management unless claudication worsens.  - Continue statin.  2. CAD: Nonobstructive on 10/18 cath. Cardiolite in 11/21 with no evidence of ischemia. No recent chest pain.  - She is on ASA 81 and statin.  3. Hyperlipidemia: Continue crestor, good lipids in 7/24  4. COPD:  Severe by PFTs on 5/21 CPX. COPD plays a significant role in her dyspnea. She has finally quit smoking.  Follows with pulmonary.  She is now on chronic prednisone.  5. Chronic systolic CHF: Nonischemic cardiomyopathy.  RHC/LHC in 10/18 with no nonobstructive coronary disease but CI was very low at 1.33.  Her low cardiac output was out of proportion to her symptoms.  [name] CRT-D device.  CPX in 5/21 showed severe COPD but only mild-moderate HF limitation.   Echo with EF stable at 35-40% in 9/22 and EF 40% on 11/23 echo.  Echo in 9/24 was stable with EF 40%, basal-mid inferoseptal and anteroseptal akinesis, normal RV. Not volume overloaded on exam. NYHA class II symptoms, probably in large part due to COPD.     - Takes Lasix prn.   - Continue Entresto 24/26 bid. No BP room to increase.  - Continue bisoprolol 2.5 mg daily. No BP room to increase.  - Continue spironolactone 25 mg daily. She thinks this may be causing loose stool.  Will have her try taking regular Imodium.  - Continue digoxin, check level today.  - Unable to tolerate [place].   Followup 3 months   [name] 10/21/2022

## 2022-10-22 ENCOUNTER — Encounter: Payer: Self-pay | Admitting: Adult Health

## 2022-10-22 ENCOUNTER — Ambulatory Visit (INDEPENDENT_AMBULATORY_CARE_PROVIDER_SITE_OTHER): Payer: Medicare Other | Admitting: Adult Health

## 2022-10-22 DIAGNOSIS — Z87891 Personal history of nicotine dependence: Secondary | ICD-10-CM | POA: Diagnosis not present

## 2022-10-22 LAB — BASIC METABOLIC PANEL
BUN/Creatinine Ratio: 15 (ref 12–28)
BUN: 19 mg/dL (ref 8–27)
CO2: 27 mmol/L (ref 20–29)
Calcium: 9 mg/dL (ref 8.7–10.3)
Chloride: 102 mmol/L (ref 96–106)
Creatinine, Ser: 1.24 mg/dL — ABNORMAL HIGH (ref 0.57–1.00)
Glucose: 116 mg/dL — ABNORMAL HIGH (ref 70–99)
Potassium: 4.7 mmol/L (ref 3.5–5.2)
Sodium: 143 mmol/L (ref 134–144)
eGFR: 45 mL/min/{1.73_m2} — ABNORMAL LOW (ref >59)

## 2022-10-22 LAB — BRAIN NATRIURETIC PEPTIDE: BNP: 287.4 pg/mL — ABNORMAL HIGH (ref 0.0–100.0)

## 2022-10-22 LAB — DIGOXIN LEVEL: Digoxin, Serum: 0.5 ng/mL (ref 0.5–0.9)

## 2022-10-22 NOTE — Patient Instructions (Signed)

## 2022-10-22 NOTE — Progress Notes (Signed)
  Virtual Visit via Telephone Note  I connected with Kimberly [name] , 10/22/22 8:59 AM by a telemedicine application and verified that I am speaking with the correct person using two identifiers.  Location: Patient: home Provider: home   I discussed the limitations of evaluation and management by telemedicine and the availability of in person appointments. The patient expressed understanding and agreed to proceed.   Shared Decision Making Visit Lung Cancer Screening Program [phone])   Eligibility: 77 y.o. Pack Years Smoking History Calculation =15 yrs (# packs/per year x # years smoked) 39yrs x 1/2ppd Recent History of coughing up blood  no Unexplained weight loss? no ( >Than 15 pounds within the last 6 months ) Prior History Lung / other cancer no (Diagnosis within the last 5 years already requiring surveillance chest CT Scans). Smoking Status Former Smoker Former Smokers: Years since quit: < 1 year  Quit Date: 2024  Visit Components: Discussion included one or more decision making aids. YES Discussion included risk/benefits of screening. YES Discussion included potential follow up diagnostic testing for abnormal scans. YES Discussion included meaning and risk of over diagnosis. YES Discussion included meaning and risk of False Positives. YES Discussion included meaning of total radiation exposure. YES  Counseling Included: Importance of adherence to annual lung cancer LDCT screening. YES Impact of comorbidities on ability to participate in the program. YES Ability and willingness to under diagnostic treatment. YES  Smoking Cessation Counseling: Former Smokers:  Discussed the importance of maintaining cigarette abstinence. yes Diagnosis Code: Personal History of Nicotine Dependence. U04.540 Information about tobacco cessation classes and interventions provided to patient. Yes Patient provided with "ticket" for LDCT Scan. yes Written Order for Lung Cancer Screening with  LDCT placed in Epic. Yes (CT Chest Lung Cancer Screening Low Dose W/O CM) [id number]  - scheduled 10/7   Z12.2-Screening of respiratory organs Z87.891-Personal history of nicotine dependence   Danford Bad 10/22/22

## 2022-10-23 ENCOUNTER — Encounter: Payer: Self-pay | Admitting: Internal Medicine

## 2022-10-23 DIAGNOSIS — N1832 Chronic kidney disease, stage 3b: Secondary | ICD-10-CM

## 2022-10-24 NOTE — Telephone Encounter (Signed)
I didn't have anything down about that, that we meant to do that at the last visit here in July 2024, either at the visit or the lab follow up  We may have mentioned a referral as something we could do, but did not actually work on getting this done  Just to clarify - does she mean a kidney doctor (nephrology), or Urology (a surgical kidney doctor)?

## 2022-10-27 ENCOUNTER — Ambulatory Visit
Admission: RE | Admit: 2022-10-27 | Discharge: 2022-10-27 | Disposition: A | Discharge status: Home / Self Care | Payer: Medicare HMO | Source: Ambulatory Visit | Attending: Acute Care | Admitting: Acute Care

## 2022-10-27 DIAGNOSIS — Z87891 Personal history of nicotine dependence: Secondary | ICD-10-CM

## 2022-10-27 DIAGNOSIS — Z122 Encounter for screening for malignant neoplasm of respiratory organs: Secondary | ICD-10-CM

## 2022-11-12 ENCOUNTER — Other Ambulatory Visit: Payer: Self-pay | Admitting: Acute Care

## 2022-11-12 DIAGNOSIS — Z87891 Personal history of nicotine dependence: Secondary | ICD-10-CM

## 2022-11-12 DIAGNOSIS — Z122 Encounter for screening for malignant neoplasm of respiratory organs: Secondary | ICD-10-CM

## 2022-11-14 ENCOUNTER — Telehealth: Payer: Self-pay | Admitting: Acute Care

## 2022-11-14 NOTE — Telephone Encounter (Signed)
This patient had her scan 10/7. It was read 10/23. Can you call her with her results? Scan was read as a LR 1, no concern of lung cancer.12 month follow up. There was notation of emphysema, cirrhosis, aortic atherosclerosis and possible pulmonary hypertension. She is on statin therapy, she is followed by the heart failure clinic and hs regular echoes and care.  Cirrhosis was also noted on her 04/2022 CTA Chest . Please make sure she gets referral to GI to evaluate the cirrhosis if this has not already been evaluated.  12 month follow up and fax results to PCP.  Thanks so much

## 2022-11-17 ENCOUNTER — Telehealth: Payer: Self-pay | Admitting: Internal Medicine

## 2022-11-17 ENCOUNTER — Telehealth: Payer: Self-pay | Admitting: Acute Care

## 2022-11-17 DIAGNOSIS — J441 Chronic obstructive pulmonary disease with (acute) exacerbation: Secondary | ICD-10-CM

## 2022-11-17 DIAGNOSIS — J449 Chronic obstructive pulmonary disease, unspecified: Secondary | ICD-10-CM

## 2022-11-17 NOTE — Telephone Encounter (Signed)
Ok noted, thanks

## 2022-11-17 NOTE — Telephone Encounter (Signed)
Patient states needs refill for Albuterol solution and Albuterol inhaler. Pharmacy is [facility]. Patient phone number is [phone].

## 2022-11-17 NOTE — Telephone Encounter (Signed)
Called and spoke to Kimberly [name]. Patient questioned the cirrhosis noted on her scan. Discussed non-alcoholic cirrhosis, Kimberly [name] states she's "never been a big drinker", advised Kimberly [name] she will need to follow up with her PCP regarding this finding, results have been sent to Kimberly [name]'s PCP. Also spoke with Kimberly [name] regarding PAH and aortic atherosclerosis. Patient will follow up with Dr. Sherene Sires on 11/27 and will discuss the [facility]. Patient states she is taking her statin regularly and has routine lab work with PCP regarding her cholesterol.    Will forward to Kimberly [name]'s PCP and Dr. Sherene Sires as Lorain Childes. Impression is below.    IMPRESSION: 1. Lung-RADS 1, negative. Continue annual screening with low-dose chest CT without contrast in 12 months. 2. Cirrhosis. 3.  Aortic atherosclerosis (ICD10-I70.0). 4. Enlarged pulmonic trunk, indicative of pulmonary arterial hypertension. 5.  Emphysema (ICD10-J43.9).     Electronically Signed   By: Leanna Battles M.D.   On: 11/12/2022 09:21

## 2022-11-17 NOTE — Telephone Encounter (Signed)
Patient states has questions about CT scan. Patient phone number is [phone].

## 2022-11-17 NOTE — Telephone Encounter (Signed)
Called and left detailed message stating that her results have been published in MyChart along with her letter and to call our office if she has any questions. Annual scan has already been ordered for 2025.

## 2022-11-21 MED ORDER — ALBUTEROL SULFATE (2.5 MG/3ML) 0.083% IN NEBU: 2.5000 mg | INHALATION_SOLUTION | Freq: Four times a day (QID) | RESPIRATORY_TRACT | 5 refills | Status: DC | PRN

## 2022-11-21 MED ORDER — ALBUTEROL SULFATE HFA 108 (90 BASE) MCG/ACT IN AERS: 2.0000 | INHALATION_SPRAY | Freq: Four times a day (QID) | RESPIRATORY_TRACT | 5 refills | Status: DC | PRN

## 2022-11-21 NOTE — Telephone Encounter (Signed)
Refills submitted.  

## 2022-11-22 ENCOUNTER — Other Ambulatory Visit (HOSPITAL_COMMUNITY): Payer: Self-pay | Admitting: Family Medicine

## 2022-12-05 ENCOUNTER — Telehealth: Payer: Self-pay | Admitting: Pharmacist

## 2022-12-08 NOTE — Telephone Encounter (Signed)
Patient is approved for [employer] patient assistance through the end of 2025.

## 2022-12-12 ENCOUNTER — Telehealth: Payer: Self-pay | Admitting: Student

## 2022-12-12 NOTE — Telephone Encounter (Signed)
I called and spoke with patient, I let her know that it looked like we did not stop the prednisone.  She stated she stopped it because of some side effects she was having.  She is now having more sob and wants to restart it.  I advised her it was fine to restart the prednisone and to let us know if she has more side effects and we could look into doing something else for the sob.  She verbalized understanding.  Nothing further needed.

## 2022-12-12 NOTE — Telephone Encounter (Signed)
PT was Rx'd Pred and she had issues with it (irritability, sleep interruptions, fractiousness) Now she would like to start it back up due to SOB. Is that OK? Please call to advise.

## 2022-12-17 ENCOUNTER — Ambulatory Visit: Payer: Medicare Other | Admitting: Internal Medicine

## 2022-12-17 ENCOUNTER — Ambulatory Visit (INDEPENDENT_AMBULATORY_CARE_PROVIDER_SITE_OTHER): Payer: Medicare HMO

## 2022-12-17 DIAGNOSIS — I420 Dilated cardiomyopathy: Secondary | ICD-10-CM

## 2022-12-19 ENCOUNTER — Inpatient Hospital Stay (HOSPITAL_COMMUNITY)
Admission: EM | Admit: 2022-12-19 | Discharge: 2022-12-22 | DRG: 193 | Disposition: A | Discharge status: Home / Self Care | Payer: Medicare HMO | Attending: Family Medicine | Admitting: Family Medicine

## 2022-12-19 ENCOUNTER — Encounter (HOSPITAL_COMMUNITY): Payer: Self-pay

## 2022-12-19 ENCOUNTER — Other Ambulatory Visit: Payer: Self-pay

## 2022-12-19 ENCOUNTER — Emergency Department (HOSPITAL_COMMUNITY): Payer: Medicare HMO

## 2022-12-19 DIAGNOSIS — Z1152 Encounter for screening for COVID-19: Secondary | ICD-10-CM

## 2022-12-19 DIAGNOSIS — Z885 Allergy status to narcotic agent status: Secondary | ICD-10-CM

## 2022-12-19 DIAGNOSIS — J441 Chronic obstructive pulmonary disease with (acute) exacerbation: Secondary | ICD-10-CM | POA: Diagnosis present

## 2022-12-19 DIAGNOSIS — I110 Hypertensive heart disease with heart failure: Secondary | ICD-10-CM | POA: Diagnosis present

## 2022-12-19 DIAGNOSIS — K7460 Unspecified cirrhosis of liver: Secondary | ICD-10-CM | POA: Diagnosis present

## 2022-12-19 DIAGNOSIS — F419 Anxiety disorder, unspecified: Secondary | ICD-10-CM | POA: Diagnosis present

## 2022-12-19 DIAGNOSIS — J189 Pneumonia, unspecified organism: Principal | ICD-10-CM | POA: Diagnosis present

## 2022-12-19 DIAGNOSIS — Z95810 Presence of automatic (implantable) cardiac defibrillator: Secondary | ICD-10-CM

## 2022-12-19 DIAGNOSIS — J439 Emphysema, unspecified: Secondary | ICD-10-CM | POA: Diagnosis present

## 2022-12-19 DIAGNOSIS — J440 Chronic obstructive pulmonary disease with acute lower respiratory infection: Principal | ICD-10-CM | POA: Diagnosis present

## 2022-12-19 DIAGNOSIS — I5023 Acute on chronic systolic (congestive) heart failure: Secondary | ICD-10-CM | POA: Diagnosis present

## 2022-12-19 DIAGNOSIS — Z8249 Family history of ischemic heart disease and other diseases of the circulatory system: Secondary | ICD-10-CM

## 2022-12-19 DIAGNOSIS — Z85828 Personal history of other malignant neoplasm of skin: Secondary | ICD-10-CM

## 2022-12-19 DIAGNOSIS — R0602 Shortness of breath: Secondary | ICD-10-CM | POA: Diagnosis not present

## 2022-12-19 DIAGNOSIS — Z883 Allergy status to other anti-infective agents status: Secondary | ICD-10-CM

## 2022-12-19 DIAGNOSIS — I739 Peripheral vascular disease, unspecified: Secondary | ICD-10-CM | POA: Diagnosis present

## 2022-12-19 DIAGNOSIS — Z79899 Other long term (current) drug therapy: Secondary | ICD-10-CM

## 2022-12-19 DIAGNOSIS — E785 Hyperlipidemia, unspecified: Secondary | ICD-10-CM | POA: Diagnosis present

## 2022-12-19 DIAGNOSIS — Z87891 Personal history of nicotine dependence: Secondary | ICD-10-CM

## 2022-12-19 DIAGNOSIS — Z7982 Long term (current) use of aspirin: Secondary | ICD-10-CM

## 2022-12-19 DIAGNOSIS — Z7952 Long term (current) use of systemic steroids: Secondary | ICD-10-CM

## 2022-12-19 DIAGNOSIS — Z66 Do not resuscitate: Secondary | ICD-10-CM | POA: Diagnosis present

## 2022-12-19 DIAGNOSIS — I2510 Atherosclerotic heart disease of native coronary artery without angina pectoris: Secondary | ICD-10-CM | POA: Diagnosis present

## 2022-12-19 DIAGNOSIS — K219 Gastro-esophageal reflux disease without esophagitis: Secondary | ICD-10-CM | POA: Diagnosis present

## 2022-12-19 DIAGNOSIS — Z823 Family history of stroke: Secondary | ICD-10-CM

## 2022-12-19 DIAGNOSIS — E875 Hyperkalemia: Secondary | ICD-10-CM | POA: Diagnosis present

## 2022-12-19 LAB — RESP PANEL BY RT-PCR (RSV, FLU A&B, COVID)  RVPGX2
Influenza A by PCR: NEGATIVE
Influenza B by PCR: NEGATIVE
Resp Syncytial Virus by PCR: NEGATIVE
SARS Coronavirus 2 by RT PCR: NEGATIVE

## 2022-12-19 LAB — CUP PACEART REMOTE DEVICE CHECK
Battery Remaining Longevity: 66 mo
Battery Remaining Percentage: 74 %
Brady Statistic RA Percent Paced: 52 %
Brady Statistic RV Percent Paced: 91 %
Date Time Interrogation Session: 20241128041100
HighPow Impedance: 78 Ohm
Lead Channel Impedance Value: 425 Ohm
Lead Channel Impedance Value: 545 Ohm
Lead Channel Impedance Value: 745 Ohm
Lead Channel Setting Pacing Amplitude: 2 V
Lead Channel Setting Pacing Amplitude: 2.5 V
Lead Channel Setting Pacing Amplitude: 2.6 V
Lead Channel Setting Pacing Pulse Width: 0.4 ms
Lead Channel Setting Pacing Pulse Width: 0.4 ms
Lead Channel Setting Sensing Sensitivity: 0.5 mV
Lead Channel Setting Sensing Sensitivity: 1 mV
Pulse Gen Serial Number: 204176
Zone Setting Status: 755011

## 2022-12-19 LAB — I-STAT VENOUS BLOOD GAS, ED
Acid-Base Excess: 2 mmol/L (ref 0.0–2.0)
Bicarbonate: 28.4 mmol/L — ABNORMAL HIGH (ref 20.0–28.0)
Calcium, Ion: 1.16 mmol/L (ref 1.15–1.40)
HCT: 37 % (ref 36.0–46.0)
Hemoglobin: 12.6 g/dL (ref 12.0–15.0)
O2 Saturation: 58 %
Potassium: 4.3 mmol/L (ref 3.5–5.1)
Sodium: 138 mmol/L (ref 135–145)
TCO2: 30 mmol/L (ref 22–32)
pCO2, Ven: 51.1 mm[Hg] (ref 44–60)
pH, Ven: 7.354 (ref 7.25–7.43)
pO2, Ven: 32 mm[Hg] (ref 32–45)

## 2022-12-19 LAB — BASIC METABOLIC PANEL
Anion gap: 9 (ref 5–15)
BUN: 23 mg/dL (ref 8–23)
CO2: 26 mmol/L (ref 22–32)
Calcium: 9 mg/dL (ref 8.9–10.3)
Chloride: 102 mmol/L (ref 98–111)
Creatinine, Ser: 1.19 mg/dL — ABNORMAL HIGH (ref 0.44–1.00)
GFR, Estimated: 48 mL/min — ABNORMAL LOW (ref >60)
Glucose, Bld: 132 mg/dL — ABNORMAL HIGH (ref 70–99)
Potassium: 4.4 mmol/L (ref 3.5–5.1)
Sodium: 137 mmol/L (ref 135–145)

## 2022-12-19 LAB — CBC WITH DIFFERENTIAL/PLATELET
Abs Immature Granulocytes: 0.02 10*3/uL (ref 0.00–0.07)
Basophils Absolute: 0.1 10*3/uL (ref 0.0–0.1)
Basophils Relative: 1 %
Eosinophils Absolute: 0.3 10*3/uL (ref 0.0–0.5)
Eosinophils Relative: 3 %
HCT: 38.8 % (ref 36.0–46.0)
Hemoglobin: 12.7 g/dL (ref 12.0–15.0)
Immature Granulocytes: 0 %
Lymphocytes Relative: 11 %
Lymphs Abs: 0.9 10*3/uL (ref 0.7–4.0)
MCH: 31.1 pg (ref 26.0–34.0)
MCHC: 32.7 g/dL (ref 30.0–36.0)
MCV: 94.9 fL (ref 80.0–100.0)
Monocytes Absolute: 0.7 10*3/uL (ref 0.1–1.0)
Monocytes Relative: 8 %
Neutro Abs: 6.4 10*3/uL (ref 1.7–7.7)
Neutrophils Relative %: 77 %
Platelets: 267 10*3/uL (ref 150–400)
RBC: 4.09 MIL/uL (ref 3.87–5.11)
RDW: 13.1 % (ref 11.5–15.5)
WBC: 8.3 10*3/uL (ref 4.0–10.5)
nRBC: 0 % (ref 0.0–0.2)

## 2022-12-19 LAB — BRAIN NATRIURETIC PEPTIDE: B Natriuretic Peptide: 472.4 pg/mL — ABNORMAL HIGH (ref 0.0–100.0)

## 2022-12-19 MED ORDER — IPRATROPIUM-ALBUTEROL 0.5-2.5 (3) MG/3ML IN SOLN
3.0000 mL | Freq: Four times a day (QID) | RESPIRATORY_TRACT | Status: DC
  Administered 2022-12-20 – 2022-12-22 (×10): 3 mL via RESPIRATORY_TRACT
  Filled 2022-12-19 (×10): qty 3

## 2022-12-19 MED ORDER — ACETAMINOPHEN 650 MG RE SUPP: 650.0000 mg | Freq: Four times a day (QID) | RECTAL | Status: DC | PRN

## 2022-12-19 MED ORDER — METHYLPREDNISOLONE SODIUM SUCC 125 MG IJ SOLR
125.0000 mg | Freq: Once | INTRAMUSCULAR | Status: AC
  Administered 2022-12-19: 125 mg via INTRAVENOUS
  Filled 2022-12-19: qty 2

## 2022-12-19 MED ORDER — ALPRAZOLAM 0.5 MG PO TABS
0.5000 mg | ORAL_TABLET | Freq: Every day | ORAL | Status: DC
  Administered 2022-12-19 – 2022-12-21 (×3): 0.5 mg via ORAL
  Filled 2022-12-19 (×3): qty 1

## 2022-12-19 MED ORDER — BENZONATATE 100 MG PO CAPS
200.0000 mg | ORAL_CAPSULE | Freq: Three times a day (TID) | ORAL | Status: DC | PRN
  Administered 2022-12-19: 200 mg via ORAL
  Filled 2022-12-19: qty 2

## 2022-12-19 MED ORDER — SENNOSIDES-DOCUSATE SODIUM 8.6-50 MG PO TABS: 1.0000 | ORAL_TABLET | Freq: Every evening | ORAL | Status: DC | PRN

## 2022-12-19 MED ORDER — IPRATROPIUM-ALBUTEROL 0.5-2.5 (3) MG/3ML IN SOLN
3.0000 mL | Freq: Four times a day (QID) | RESPIRATORY_TRACT | Status: DC | PRN
  Administered 2022-12-19: 3 mL via RESPIRATORY_TRACT
  Filled 2022-12-19: qty 3

## 2022-12-19 MED ORDER — SPIRONOLACTONE 25 MG PO TABS
25.0000 mg | ORAL_TABLET | Freq: Every day | ORAL | Status: DC
  Administered 2022-12-19: 25 mg via ORAL
  Filled 2022-12-19: qty 1

## 2022-12-19 MED ORDER — SODIUM CHLORIDE 0.9 % IV SOLN: 1.0000 g | INTRAVENOUS | Status: DC

## 2022-12-19 MED ORDER — SODIUM CHLORIDE 0.9 % IV SOLN
1.0000 g | Freq: Once | INTRAVENOUS | Status: AC
  Administered 2022-12-19: 1 g via INTRAVENOUS
  Filled 2022-12-19: qty 10

## 2022-12-19 MED ORDER — ACETAMINOPHEN 325 MG PO TABS: 650.0000 mg | ORAL_TABLET | Freq: Four times a day (QID) | ORAL | Status: DC | PRN

## 2022-12-19 MED ORDER — DIGOXIN 0.0625 MG HALF TABLET
0.0625 mg | ORAL_TABLET | Freq: Every day | ORAL | Status: DC
  Administered 2022-12-21 – 2022-12-22 (×2): 0.0625 mg via ORAL
  Filled 2022-12-19 (×3): qty 1

## 2022-12-19 MED ORDER — ONDANSETRON HCL 4 MG/2ML IJ SOLN: 4.0000 mg | Freq: Four times a day (QID) | INTRAMUSCULAR | Status: DC | PRN

## 2022-12-19 MED ORDER — ROSUVASTATIN CALCIUM 5 MG PO TABS
10.0000 mg | ORAL_TABLET | Freq: Every day | ORAL | Status: DC
  Administered 2022-12-20 – 2022-12-22 (×3): 10 mg via ORAL
  Filled 2022-12-19 (×3): qty 2

## 2022-12-19 MED ORDER — IPRATROPIUM-ALBUTEROL 0.5-2.5 (3) MG/3ML IN SOLN
3.0000 mL | RESPIRATORY_TRACT | Status: DC | PRN
  Administered 2022-12-21: 3 mL via RESPIRATORY_TRACT
  Filled 2022-12-19: qty 3

## 2022-12-19 MED ORDER — ALBUTEROL SULFATE (2.5 MG/3ML) 0.083% IN NEBU: 10.0000 mg/h | INHALATION_SOLUTION | RESPIRATORY_TRACT | Status: AC

## 2022-12-19 MED ORDER — PREDNISONE 10 MG PO TABS
40.0000 mg | ORAL_TABLET | Freq: Every day | ORAL | Status: DC
  Administered 2022-12-20 – 2022-12-22 (×3): 40 mg via ORAL
  Filled 2022-12-19 (×3): qty 4

## 2022-12-19 MED ORDER — SODIUM CHLORIDE 0.9 % IV SOLN
2.0000 g | INTRAVENOUS | Status: DC
  Administered 2022-12-20 – 2022-12-21 (×2): 2 g via INTRAVENOUS
  Filled 2022-12-19 (×2): qty 20

## 2022-12-19 MED ORDER — BISOPROLOL FUMARATE 5 MG PO TABS
2.5000 mg | ORAL_TABLET | Freq: Every day | ORAL | Status: DC
  Administered 2022-12-20 – 2022-12-22 (×3): 2.5 mg via ORAL
  Filled 2022-12-19 (×3): qty 0.5

## 2022-12-19 MED ORDER — ASPIRIN 81 MG PO CHEW
81.0000 mg | CHEWABLE_TABLET | Freq: Every day | ORAL | Status: DC
  Administered 2022-12-20 – 2022-12-22 (×3): 81 mg via ORAL
  Filled 2022-12-19 (×3): qty 1

## 2022-12-19 MED ORDER — MAGNESIUM SULFATE 2 GM/50ML IV SOLN
2.0000 g | Freq: Once | INTRAVENOUS | Status: AC
  Administered 2022-12-19: 2 g via INTRAVENOUS
  Filled 2022-12-19: qty 50

## 2022-12-19 MED ORDER — FLUTICASONE FUROATE-VILANTEROL 200-25 MCG/ACT IN AEPB
1.0000 | INHALATION_SPRAY | Freq: Every day | RESPIRATORY_TRACT | Status: DC
  Administered 2022-12-20 – 2022-12-22 (×3): 1 via RESPIRATORY_TRACT
  Filled 2022-12-19: qty 28

## 2022-12-19 MED ORDER — ONDANSETRON HCL 4 MG PO TABS: 4.0000 mg | ORAL_TABLET | Freq: Four times a day (QID) | ORAL | Status: DC | PRN

## 2022-12-19 MED ORDER — SODIUM CHLORIDE 0.9 % IV SOLN
500.0000 mg | Freq: Once | INTRAVENOUS | Status: AC
  Administered 2022-12-19: 500 mg via INTRAVENOUS
  Filled 2022-12-19: qty 5

## 2022-12-19 MED ORDER — AZITHROMYCIN 500 MG PO TABS
500.0000 mg | ORAL_TABLET | Freq: Every day | ORAL | Status: DC
  Administered 2022-12-20 – 2022-12-22 (×3): 500 mg via ORAL
  Filled 2022-12-19 (×3): qty 1

## 2022-12-19 MED ORDER — ENOXAPARIN SODIUM 30 MG/0.3ML IJ SOSY
30.0000 mg | PREFILLED_SYRINGE | INTRAMUSCULAR | Status: DC
Start: 2022-12-19 — End: 2022-12-22
  Filled 2022-12-19 (×2): qty 0.3

## 2022-12-19 MED ORDER — ALBUTEROL SULFATE (2.5 MG/3ML) 0.083% IN NEBU
INHALATION_SOLUTION | RESPIRATORY_TRACT | Status: AC
  Administered 2022-12-19: 10 mg via RESPIRATORY_TRACT
  Filled 2022-12-19: qty 12

## 2022-12-19 MED ORDER — DM-GUAIFENESIN ER 30-600 MG PO TB12
1.0000 | ORAL_TABLET | Freq: Two times a day (BID) | ORAL | Status: DC
  Administered 2022-12-19: 1 via ORAL
  Filled 2022-12-19: qty 1

## 2022-12-19 MED ORDER — IPRATROPIUM-ALBUTEROL 0.5-2.5 (3) MG/3ML IN SOLN
3.0000 mL | Freq: Once | RESPIRATORY_TRACT | Status: AC
  Administered 2022-12-19: 3 mL via RESPIRATORY_TRACT
  Filled 2022-12-19: qty 3

## 2022-12-19 MED ORDER — SACUBITRIL-VALSARTAN 24-26 MG PO TABS
1.0000 | ORAL_TABLET | Freq: Two times a day (BID) | ORAL | Status: DC
  Filled 2022-12-19: qty 1

## 2022-12-19 NOTE — H&P (Signed)
History and Physical    Patient: Kimberly [name]:[id number] DOB: 1945-04-28 DOA: 12/19/2022 DOS: the patient was seen and examined on 12/19/2022 PCP: Corwin Levins, MD  Patient coming from: Home  Chief Complaint:  Chief Complaint  Patient presents with   Shortness of Breath   HPI: Kimberly [name] is a 77 y.o. female with medical history significant of COPD, anxiety, CHF, HLD, HTN, GERD, CAD and PAD who presented to to the ED for evaluation of shortness of breath.  Patient reports that 3 days ago, she started sneezing and coughing.  She thought this was her allergies however last night she started having worsening productive cough with white sputum as well as difficulty breathing.  She endorses associated chills, wheezing and nasal drainage but denies any fevers, nausea, vomiting, headache, dizziness, abdominal pain or leg swelling.  ED course: Soft BP with SBP in 100s, tachypneic with RR in the 20s, low-grade fever of 99.2, HR in the 50s-60s and SpO2 93-95% on room air. Labs show WBC 8.3, Hgb 12.7, platelet 267, K+ 4.4, creatinine 1.19, BNP 472, normal VBG, COVID and flu test. CXR showed new RUL opacity concerning for early pneumonia CT chest heterogeneous opacity in RUL concerning for focal pneumonitis Patient received IV Rocephin and azithromycin, DuoNebs x 1. TRH consulted for admission  Review of Systems: As mentioned in the history of present illness. All other systems reviewed and are negative. Past Medical History:  Diagnosis Date   AICD (automatic cardioverter/defibrillator) present 03/17/2017   Anxiety    Atrial tachycardia (HCC)    Basal cell carcinoma    R ant thigh, L pretibia, both txted at [facility]   Bursitis of shoulder, right, adhesive    Cancer (HCC)    CHF (congestive Kimberly failure) (HCC)    Chronic bronchitis (HCC)    "1-2 times/yr" (01/23/2014)   COPD (chronic obstructive pulmonary disease) (HCC)    CVD (cerebrovascular disease)    Dyslipidemia     Dysrhythmia    Frequency of urination    GERD (gastroesophageal reflux disease)    Kimberly murmur    History of stomach ulcers    HTN (hypertension) 02/22/2011   Migraines    "stopped many years ago" (06/14/2014)   Osteoporosis 08/19/2016   Pericarditis    Pneumonia "10 times" (06/14/2014)   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)    Silent myocardial infarction (HCC) "late 1990's"   Stress incontinence    "was suppose to have been tacked up years ago but I didn't do it"   Syncope, near    Associated with atrial tachycardia-event recorder 1/16   Thoracic outlet syndrome    VSD (ventricular septal defect)    Past Surgical History:  Procedure Laterality Date   ABDOMINAL AORTOGRAM W/LOWER EXTREMITY N/A 12/17/2016   Procedure: ABDOMINAL AORTOGRAM W/LOWER EXTREMITY;  Surgeon: Iran Ouch, MD;  Location: [facility] INVASIVE CV LAB;  Service: Cardiovascular;  Laterality: N/A;   BIV ICD INSERTION CRT-D N/A 03/17/2017   Procedure: BIV ICD INSERTION CRT-D;  Surgeon: Marinus Maw, MD;  Location: [facility] INVASIVE CV LAB;  Service: Cardiovascular;  Laterality: N/A;   CARDIAC CATHETERIZATION  "quite a few"   CESAREAN SECTION  1972   CHOLECYSTECTOMY OPEN  1970's   CORONARY ANGIOGRAM  2000   No significant CAD   ELECTROPHYSIOLOGIC STUDY N/A 06/14/2014   Procedure: A-Flutter/A-Tach/SVT Ablation;  Surgeon: Marinus Maw, MD;  Location: [facility] INVASIVE CV LAB;  Service: Cardiovascular;  Laterality: N/A;  INSERTION OF ICD  03/17/2017   BIV   MULTIPLE TOOTH EXTRACTIONS     MYRINGOTOMY WITH TUBE PLACEMENT Right 2015   PERIPHERAL VASCULAR INTERVENTION  12/17/2016   Procedure: PERIPHERAL VASCULAR INTERVENTION;  Surgeon: Iran Ouch, MD;  Location: [facility] INVASIVE CV LAB;  Service: Cardiovascular;;  Right common femoral PTA and Stent   RIGHT/LEFT Kimberly CATH AND CORONARY ANGIOGRAPHY Bilateral 11/06/2016   Procedure: RIGHT/LEFT Kimberly CATH AND CORONARY ANGIOGRAPHY;  Surgeon:  Iran Ouch, MD;  Location: [facility] INVASIVE CV LAB;  Service: Cardiovascular;  Laterality: Bilateral;   SVT ABLATION  06/14/2014   TUBAL LIGATION  1972   VSD REPAIR  1958; 1967   Social History:  reports that she has quit smoking. Her smoking use included cigarettes. She has a 17.5 pack-year smoking history. She has never used smokeless tobacco. She reports that she does not currently use alcohol after a past usage of about 1.0 standard drink of alcohol per week. She reports that she does not use drugs.  Allergies  Allergen Reactions   Mupirocin Shortness Of Breath and Other (See Comments)    Burning, pain, swelling and sob   Codeine Nausea Only    Family History  Problem Relation Age of Onset   Cancer Mother    Cancer Father    Throat cancer Other    Hypertension Other    Stroke Other    Alcohol abuse Other    Arthritis Other    Cancer Other        lung cancer   Hypertension Other    Arthritis Other    Stroke Other    Breast cancer Neg Hx     Prior to Admission medications   Medication Sig Start Date End Date Taking? Authorizing Provider  albuterol (PROVENTIL) (2.5 MG/3ML) 0.083% nebulizer solution Take 3 mLs (2.5 mg total) by nebulization every 6 (six) hours as needed for wheezing or shortness of breath. TAKE 3 MLS (1 VIAL) BY NEBULIZATION EVERY 6 HOURS AS NEEDED FOR WHEEZING OR SHORTNESS OF BREATH 11/21/22  Yes Nyoka Cowden, MD  albuterol (VENTOLIN HFA) 108 (90 Base) MCG/ACT inhaler Inhale 2 puffs into the lungs every 6 (six) hours as needed for wheezing or shortness of breath. 11/21/22  Yes Nyoka Cowden, MD  ALPRAZolam Prudy Feeler) 0.5 MG tablet 1 tab by mouth twice per day as needed 07/30/22  Yes Corwin Levins, MD  aspirin 81 MG tablet Take 1 tablet (81 mg total) by mouth daily. 07/24/16  Yes Corwin Levins, MD  bisoprolol (ZEBETA) 5 MG tablet TAKE 1/2 TABLET (2.5 MG TOTAL) BY MOUTH DAILY Patient taking differently: Take 2.5 mg by mouth daily. 05/22/22  Yes Milford, Anderson Malta,  FNP  digoxin (LANOXIN) 0.125 MG tablet TAKE 1/2 TABLET (62.5 MCG TOTAL) BY MOUTH DAILY Patient taking differently: Take 0.0625 mg by mouth daily. 11/24/22  Yes Milford, Anderson Malta, FNP  furosemide (LASIX) 20 MG tablet Take 1 tablet (20 mg total) by mouth daily as needed. 06/27/21  Yes Laurey Morale, MD  predniSONE (DELTASONE) 5 MG tablet Take 1 tablet (5 mg total) by mouth daily with breakfast. 07/09/22  Yes Cobb, Ruby Cola, NP  rosuvastatin (CRESTOR) 10 MG tablet TAKE 1 TABLET BY MOUTH DAILY 06/02/22  Yes Arida, Chelsea Aus, MD  sacubitril-valsartan (ENTRESTO) 24-26 MG Take 1 tablet by mouth 2 (two) times daily.   Yes [provider]  spironolactone (ALDACTONE) 25 MG tablet TAKE 1 TABLET BY MOUTH AT BEDTIME 09/18/22  Yes Shirlee Latch,  Eliot Ford, MD  tiZANidine (ZANAFLEX) 2 MG tablet Take 1 tablet (2 mg total) by mouth every 6 (six) hours as needed for muscle spasms. 09/17/22  Yes Corwin Levins, MD    Physical Exam: Vitals:   12/19/22 1600 12/19/22 1713 12/19/22 1926 12/19/22 1929  BP: (!) 133/106  (!) 89/77 96/70  Pulse: 61  92 72  Resp: (!) 36  18 20  Temp: 97.6 F (36.4 C)  97.9 F (36.6 C) 98.4 F (36.9 C)  TempSrc: Oral  Oral Oral  SpO2: 98% 91% 90% 97%  Weight:      Height:       General: Pleasant, well-appearing elderly woman laying in bed. No acute distress. HEENT: Justin/AT. Anicteric sclera CV: RRR. II/VI systolic murmur. No LE edema Pulmonary: Lungs CTAB. Tachypneic. Mildly increased work of breathing but no accessory muscle use. Moderate wheezing in the upper lungs.  No rales.  Decreased air movement throughout. Abdominal: Soft, nontender, nondistended. Normal bowel sounds. Extremities: Palpable radial and DP pulses. Normal ROM. Skin: Warm and dry. No obvious rash or lesions. Neuro: A&Ox3. Moves all extremities. Normal sensation to light touch. No focal deficit. Psych: Normal mood and affect  Data Reviewed:  Labs show WBC 8.3, Hgb 12.7, platelet 267, K+ 4.4, creatinine  1.19, BNP 472, normal VBG, COVID and flu test. CXR showed new RUL opacity concerning for early pneumonia CT chest heterogeneous opacity in RUL concerning for focal pneumonitis EKG shows sinus rhythm with RBBB  Assessment and Plan: Kimberly [name] is a 77 y.o. female with medical history significant of COPD, anxiety, CHF, HLD, HTN, GERD, CAD, and PAD who presented to to the ED for evaluation of shortness of breath and admitted for pneumonia and COPD exacerbation  # Community-acquired pneumonia Pt with history of COPD here for evaluation of 3 days productive cough, chills and 1 day shortness of breath. She has low-grade fever but no leukocytosis. Imaging concerning for early pneumonia.  -Continue IV Rocephin -Change azithromycin to p.o. -Mucinex DM twice daily -Follow-up blood culture, sputum culture, procalcitonin -CBC, fever curve  # COPD exacerbation Patient presented with 3 days of productive cough and new onset of shortness of breath and wheezing last night.  She is tachypneic on arrival found to have moderate wheezing on lung exam.  Received DuoNeb and IV mag in the ED. -IV Solu-Medrol x 1 -Prednisone 40 mg daily starting tomorrow for 4 days then transition to home prednisone 5 mg daily -Start Breo Ellipta 1 puff daily -DuoNebs every 4 hours as needed for SOB or wheezing -Incentive spirometer  # HFrEF Last TTE in 9/24 showed EF 40%, anteroseptal akinesis, G1DD but no valvular abnormalities. On as needed Lasix at home. BNP elevated to 472 however patient looks euvolemic on exam. -Continue digoxin, bisoprolol, spironolactone and Entresto -Supplemental O2 as needed  # HLD # PAD # CAD -Rosuvastatin 10 mg daily -Aspirin 81 mg daily  # Anxiety -Alprazolam 0.5 mg daily at bedtime   Advance Care Planning:   Code Status: Limited: Do not attempt resuscitation (DNR) -DNR-LIMITED -Do Not Intubate/DNI    Consults: None  Family Communication: Discussed admission with sister at  bedside  Severity of Illness: The appropriate patient status for this patient is OBSERVATION. Observation status is judged to be reasonable and necessary in order to provide the required intensity of service to ensure the patient's safety. The patient's presenting symptoms, physical exam findings, and initial radiographic and laboratory data in the context of their medical condition is felt  to place them at decreased risk for further clinical deterioration. Furthermore, it is anticipated that the patient will be medically stable for discharge from the hospital within 2 midnights of admission.   Author: Steffanie Rainwater, MD 12/19/2022 7:34 PM  For on call review [email or web address].

## 2022-12-19 NOTE — Progress Notes (Signed)
Patient was started on a 1hr CAT due to increased RR, expiratory and inspiratory wheezes and diminished BS.

## 2022-12-19 NOTE — Progress Notes (Addendum)
[facility] night cross cover note:   I was notified by RN of the patient's request for cough suppressant.  I subsequently ordered prn Tessalon Perles.   Update: The patient also request that her breathing treatments to be scheduled.  I subsequently added duo nebulizer treatments every 6 hours on a scheduled basis.    Newton Pigg, DO Hospitalist

## 2022-12-19 NOTE — Plan of Care (Signed)
Problem: Education: Goal: Knowledge of General Education information will improve Description: Including pain rating scale, medication(s)/side effects and non-pharmacologic comfort measures Outcome: Progressing Pt understands she was admitted into the hospitalfor SOB and nonproductive cough. She has an admitting diagnosis of CAPNA.  Pt will be in IV abx per MD's orders.    Problem: Clinical Measurements: Goal: Will remain free from infection Outcome: Progressing S/Sx of infection monitored and assessed q-shift.  Pt has remained afebrile thus far.  She will be in IV abx per MD's orders.    Problem: Clinical Measurements: Goal: Respiratory complications will improve Outcome: Progressing Respiratory status monitored and assessed q-shift.  Pt is on 2L of O2 via nasal cannula with PO2 at 91-98% and respiration rate of 20-36 breaths per minute.  Pt has not endorsed c/o SOB or DOE.    Problem: Clinical Measurements: Goal: Cardiovascular complication will be avoided Outcome: Progressing Pt VS are abnormal d/t her respiratory status and BP.  MDs are aware.    Problem: Activity: Goal: Risk for activity intolerance will decrease Outcome: Progressing Pt is independent of all her ADLs.  She does not needs the assistance of RN staff to help her OOB.       Problem: Nutrition: Goal: Adequate nutrition will be maintained Outcome: Progressing Pt is on a heart healthy diet per MD's orders and can tolerate it w/o s/sx of abdominal pain/ distention or n/v.     Problem: Safety: Goal: Ability to remain free from injury will improve Outcome: Progressing Pt has remained free from falls thus far.  Instructed pt to utilize RN call light for assistance.  Hourly rounds performed. Bed in lowest position, locked with two upper side rails engaged.  Belongings and call light within reach.    Problem: Skin Integrity: Goal: Risk for impaired skin integrity will decrease Outcome: Progressing Skin integrity  monitored and assessed q-shift.  Instructed pt to q2 hours to prevent further skin impairment.  Tubes and drains assessed for device related pressures sores.  Pt is continent of both bowel and bladder.     Problem: Pain Managment: Goal: General experience of comfort will improve Outcome: Progressing Pt has denied pain thus far.

## 2022-12-19 NOTE — ED Provider Notes (Signed)
[place] EMERGENCY DEPARTMENT AT [facility] Provider Note   CSN: [id number] Arrival date & time: 12/19/22  7829     History  Chief Complaint  Patient presents with   Shortness of Breath    Kimberly [name] is a 77 y.o. female.  She has a history of COPD CHF cardiomyopathy AICD VSD repair.  She is not usually on oxygen.  She said she had a head cold starting 3 days ago with nonproductive cough, sneezing.  No fever.  Today she woke up and felt like she could not breathe.  She tried a breathing treatment and a Xanax with a little bit of improvement.  She said the last time she felt like this was about a year ago.  She denies any chest pain abdominal pain vomiting diarrhea.  She has had a nonproductive cough although has had some light green discharge when she blows her nose.  The history is provided by the patient and the spouse.  Shortness of Breath Severity:  Severe Onset quality:  Sudden Duration:  2 hours Timing:  Constant Progression:  Improving Chronicity:  Recurrent Context: URI   Relieved by:  Nothing Worsened by:  Activity and coughing Ineffective treatments: nebs. Associated symptoms: cough and wheezing   Associated symptoms: no abdominal pain, no chest pain, no fever, no hemoptysis, no sputum production and no vomiting        Home Medications Prior to Admission medications   Medication Sig Start Date End Date Taking? Authorizing Provider  albuterol (PROVENTIL) (2.5 MG/3ML) 0.083% nebulizer solution Take 3 mLs (2.5 mg total) by nebulization every 6 (six) hours as needed for wheezing or shortness of breath. TAKE 3 MLS (1 VIAL) BY NEBULIZATION EVERY 6 HOURS AS NEEDED FOR WHEEZING OR SHORTNESS OF BREATH 11/21/22   Nyoka Cowden, MD  albuterol (VENTOLIN HFA) 108 (90 Base) MCG/ACT inhaler Inhale 2 puffs into the lungs every 6 (six) hours as needed for wheezing or shortness of breath. 11/21/22   Nyoka Cowden, MD  ALPRAZolam Prudy Feeler) 0.5 MG tablet 1 tab by mouth  twice per day as needed 07/30/22   Corwin Levins, MD  aspirin 81 MG tablet Take 1 tablet (81 mg total) by mouth daily. 07/24/16   Corwin Levins, MD  bisoprolol (ZEBETA) 5 MG tablet TAKE 1/2 TABLET (2.5 MG TOTAL) BY MOUTH DAILY 05/22/22   Milford, Anderson Malta, FNP  chlorpheniramine-HYDROcodone (TUSSIONEX) 10-8 MG/5ML Take 5 mLs by mouth every 12 (twelve) hours as needed for cough. 03/06/22   Corwin Levins, MD  digoxin (LANOXIN) 0.125 MG tablet TAKE 1/2 TABLET (62.5 MCG TOTAL) BY MOUTH DAILY 11/24/22   Milford, Anderson Malta, FNP  furosemide (LASIX) 20 MG tablet Take 1 tablet (20 mg total) by mouth daily as needed. 06/27/21   Laurey Morale, MD  HYDROcodone-acetaminophen (NORCO/VICODIN) 5-325 MG tablet Take 1 tablet by mouth every 6 (six) hours as needed for moderate pain. 04/04/22   Corwin Levins, MD  ibuprofen (ADVIL,MOTRIN) 200 MG tablet Take 200 mg by mouth daily as needed for headache.    [provider]  mometasone (ELOCON) 0.1 % cream Apply 1 Application topically daily as needed (Rash). 03/20/22   Deirdre Evener, MD  predniSONE (DELTASONE) 5 MG tablet Take 1 tablet (5 mg total) by mouth daily with breakfast. 07/09/22   Allison Quarry, Ruby Cola, NP  rosuvastatin (CRESTOR) 10 MG tablet TAKE 1 TABLET BY MOUTH DAILY 06/02/22   Iran Ouch, MD  sacubitril-valsartan [facility]) 24-26  MG Take 1 tablet by mouth 2 (two) times daily.    [provider]  spironolactone (ALDACTONE) 25 MG tablet TAKE 1 TABLET BY MOUTH AT BEDTIME 09/18/22   Laurey Morale, MD  Tiotropium Bromide-Olodaterol (STIOLTO RESPIMAT) 2.5-2.5 MCG/ACT AERS Inhale 2 puffs into the lungs daily. 06/25/22   Cobb, Ruby Cola, NP  Tiotropium Bromide-Olodaterol (STIOLTO RESPIMAT) 2.5-2.5 MCG/ACT AERS Inhale 2 puffs into the lungs daily. 07/09/22   Cobb, Ruby Cola, NP  tiZANidine (ZANAFLEX) 2 MG tablet Take 1 tablet (2 mg total) by mouth every 6 (six) hours as needed for muscle spasms. 09/17/22   Corwin Levins, MD      Allergies     Mupirocin and Codeine    Review of Systems   Review of Systems  Constitutional:  Negative for fever.  Respiratory:  Positive for cough, shortness of breath and wheezing. Negative for hemoptysis and sputum production.   Cardiovascular:  Negative for chest pain.  Gastrointestinal:  Negative for abdominal pain and vomiting.    Physical Exam Updated Vital Signs BP (!) 98/53   Pulse 72   Temp 99.2 F (37.3 C) (Oral)   Resp (!) 28   Ht 5\' 1"  (1.549 m)   Wt 48.5 kg   SpO2 97%   BMI 20.22 kg/m  Physical Exam Vitals and nursing note reviewed.  Constitutional:      General: She is not in acute distress.    Appearance: She is well-developed.  HENT:     Head: Normocephalic and atraumatic.  Eyes:     Conjunctiva/sclera: Conjunctivae normal.  Cardiovascular:     Rate and Rhythm: Normal rate and regular rhythm.     Heart sounds: Murmur heard.  Pulmonary:     Effort: Tachypnea and accessory muscle usage present. No respiratory distress.     Breath sounds: Normal breath sounds.  Abdominal:     Palpations: Abdomen is soft.     Tenderness: There is no abdominal tenderness.  Musculoskeletal:        General: No swelling.     Cervical back: Neck supple.     Right lower leg: No tenderness. No edema.     Left lower leg: No tenderness. No edema.  Skin:    General: Skin is warm and dry.     Capillary Refill: Capillary refill takes less than 2 seconds.  Neurological:     General: No focal deficit present.     Mental Status: She is alert.     ED Results / Procedures / Treatments   Labs (all labs ordered are listed, but only abnormal results are displayed) Labs Reviewed  BASIC METABOLIC PANEL - Abnormal; Notable for the following components:      Result Value   Glucose, Bld 132 (*)    Creatinine, Ser 1.19 (*)    GFR, Estimated 48 (*)    All other components within normal limits  BRAIN NATRIURETIC PEPTIDE - Abnormal; Notable for the following components:   B Natriuretic Peptide  472.4 (*)    All other components within normal limits  I-STAT VENOUS BLOOD GAS, ED - Abnormal; Notable for the following components:   Bicarbonate 28.4 (*)    All other components within normal limits  RESP PANEL BY RT-PCR (RSV, FLU A&B, COVID)  [id number]  CULTURE, BLOOD (ROUTINE X 2)  CULTURE, BLOOD (ROUTINE X 2)  CBC WITH DIFFERENTIAL/PLATELET    EKG EKG Interpretation Date/Time:  Friday December 19 2022 09:22:39 EST Ventricular Rate:  69 PR Interval:  128 QRS Duration:  159 QT Interval:  414 QTC Calculation: 447 R Axis:   43  Text Interpretation: Sinus rhythm Right bundle branch block Poor data quality Confirmed by Meridee Score [phone]) on 12/19/2022 9:24:03 AM  Radiology CUP PACEART REMOTE DEVICE CHECK  Result Date: 12/19/2022 Scheduled remote reviewed. Normal device function.  Within the monitoring period, HF diagnostics have been normal.  2 AHR detections lasting less than 5 seconds. Next remote 91 days. - CS, CVRS  CT Chest Wo Contrast  Result Date: 12/19/2022 CLINICAL DATA:  Pneumonia, complication suspected, xray done. Shortness of breath. EXAM: CT CHEST WITHOUT CONTRAST TECHNIQUE: Multidetector CT imaging of the chest was performed following the standard protocol without IV contrast. RADIATION DOSE REDUCTION: This exam was performed according to the departmental dose-optimization program which includes automated exposure control, adjustment of the mA and/or kV according to patient size and/or use of iterative reconstruction technique. COMPARISON:  CT scan chest from 10/27/2022 FINDINGS: Cardiovascular: Normal cardiac size. No pericardial effusion. No aortic aneurysm. There are coronary artery calcifications, in keeping with coronary artery disease. There are also moderate peripheral atherosclerotic vascular calcifications of thoracic aorta and its major branches. Mediastinum/Nodes: Visualized thyroid gland appears grossly unremarkable. No solid / cystic mediastinal masses.  The esophagus is nondistended precluding optimal assessment. No axillary, mediastinal or hilar lymphadenopathy by size criteria. Lungs/Pleura: The central tracheo-bronchial tree is patent. There are occlusive and nonocclusive filling defects in the bilateral lower lobe bronchial tree, likely due to mucus/secretions or aspiration There are patchy areas of linear, plate-like atelectasis and/or scarring throughout bilateral lungs. Mild upper lobe predominant centrilobular emphysema noted. There is new, heterogeneous opacity in the right upper lobe, abutting the minor fissure (, series 6, image 45), with associated ground-glass opacities in the right upper lobe, concerning for focal pneumonitis. No mass or consolidation. No pleural effusion or pneumothorax. No suspicious lung nodules. Upper Abdomen: Surgically absent gallbladder. Remaining visualized upper abdominal viscera within normal limits. Musculoskeletal: . A dual lead cardiac pacemaker / defibrillator is noted with its battery pack overlying the right chest wall and the leads terminating in the right atrium and apex of right ventricle. Visualized soft tissues of the chest wall are otherwise grossly unremarkable. No suspicious osseous lesions. There are mild multilevel degenerative changes in the visualized spine. IMPRESSION: *There is a new, heterogeneous opacity in the right upper lobe abutting the minor fissure with associated ground-glass opacities in the right upper lobe, concerning for focal pneumonitis follow-up to clearing is recommended. *Multiple other nonacute observations, as described above. Aortic Atherosclerosis (ICD10-I70.0) and Emphysema (ICD10-J43.9). Electronically Signed   By: Jules Schick M.D.   On: 12/19/2022 12:54   DG Chest Port 1 View  Result Date: 12/19/2022 CLINICAL DATA:  Shortness of breath.  History of COPD. EXAM: PORTABLE CHEST 1 VIEW COMPARISON:  Chest radiographs 04/24/2022 and CT 10/27/2022 FINDINGS: A right subclavian  approach ICD remains in place. The cardiomediastinal silhouette is unchanged normal heart size. Sternal wires are again noted. The lungs remain hyperinflated. Asymmetric interstitial type opacities in the right upper lobe are new from the prior radiographs. No sizable pleural effusion or pneumothorax is identified. No acute osseous abnormality is seen. IMPRESSION: New mild right upper lobe opacity which may reflect early pneumonia. Electronically Signed   By: Sebastian Ache M.D.   On: 12/19/2022 10:16    Procedures Procedures    Medications Ordered in ED Medications  enoxaparin (LOVENOX) injection 30 mg (30 mg Subcutaneous Not Given 12/19/22 1610)  acetaminophen (TYLENOL) tablet  650 mg (has no administration in time range)    Or  acetaminophen (TYLENOL) suppository 650 mg (has no administration in time range)  senna-docusate (Senokot-S) tablet 1 tablet (has no administration in time range)  ondansetron (ZOFRAN) tablet 4 mg (has no administration in time range)    Or  ondansetron (ZOFRAN) injection 4 mg (has no administration in time range)  predniSONE (DELTASONE) tablet 40 mg (has no administration in time range)  ALPRAZolam (XANAX) tablet 0.5 mg (has no administration in time range)  aspirin chewable tablet 81 mg (has no administration in time range)  bisoprolol (ZEBETA) tablet 2.5 mg (has no administration in time range)  digoxin (LANOXIN) tablet 0.0625 mg (has no administration in time range)  rosuvastatin (CRESTOR) tablet 10 mg (has no administration in time range)  sacubitril-valsartan (ENTRESTO) 24-26 mg per tablet (has no administration in time range)  spironolactone (ALDACTONE) tablet 25 mg (has no administration in time range)  fluticasone furoate-vilanterol (BREO ELLIPTA) 200-25 MCG/ACT 1 puff (1 puff Inhalation Not Given 12/19/22 1731)  ipratropium-albuterol (DUONEB) 0.5-2.5 (3) MG/3ML nebulizer solution 3 mL (has no administration in time range)  albuterol (PROVENTIL) (2.5 MG/3ML)  0.083% nebulizer solution (10 mg Nebulization Given 12/19/22 1712)  dextromethorphan-guaiFENesin (MUCINEX DM) 30-600 MG per 12 hr tablet 1 tablet (has no administration in time range)  ipratropium-albuterol (DUONEB) 0.5-2.5 (3) MG/3ML nebulizer solution 3 mL (3 mLs Nebulization Given 12/19/22 1015)  magnesium sulfate IVPB 2 g 50 mL (0 g Intravenous Stopped 12/19/22 1115)  cefTRIAXone (ROCEPHIN) 1 g in sodium chloride 0.9 % 100 mL IVPB (0 g Intravenous Stopped 12/19/22 1455)  azithromycin (ZITHROMAX) 500 mg in sodium chloride 0.9 % 250 mL IVPB (500 mg Intravenous New Bag/Given 12/19/22 1609)  methylPREDNISolone sodium succinate (SOLU-MEDROL) 125 mg/2 mL injection 125 mg (125 mg Intravenous Given 12/19/22 1610)    ED Course/ Medical Decision Making/ A&P Clinical Course as of 12/19/22 1847  Fri Dec 19, 2022  1010 Chest x-ray interpreted by me as no clear infiltrate.  Awaiting radiology reading. [MB]  1025 Radiology is calling a new right upper lobe opacity that may be a new infiltrate [MB]  1132 Updated patient that she is getting a chest CT.  She is resting comfortably. [MB]  1304 Patient's CAT scan showing focal pneumonitis.  I reviewed with patient.  She said she thought she was going to die this morning so she would rather be admitted to the hospital for IV antibiotics until her breathing is improved enough.  I think that is reasonable at this time.  Have paged hospitalist for admission [MB]  1331 Discussed with [facility] hospitalist Dr. Kirke Corin who will evaluate patient for admission [MB]    Clinical Course User Index [MB] Terrilee Files, MD                                 Medical Decision Making Amount and/or Complexity of Data Reviewed Labs: ordered. Radiology: ordered.  Risk Prescription drug management. Decision regarding hospitalization.   This patient complains of head cold shortness of breath; this involves an extensive number of treatment Options and is a complaint that  carries with it a high risk of complications and morbidity. The differential includes COPD, CHF, COVID, flu, pneumonia, pneumothorax, PE  I ordered, reviewed and interpreted labs, which included CBC with normal white count, chemistries stable, VBG without significant acidosis, BNP mildly elevated, blood culture sent, COVID and flu negative I ordered medication  IV steroids IV antibiotics, IV magnesium and reviewed PMP when indicated. I ordered imaging studies which included chest x-ray, CT chest and I independently    visualized and interpreted imaging which showed right upper lobe pneumonitis  Additional history obtained from patient's husband Previous records obtained and reviewed in epic including recent discharge summaries, cardiology and pulmonary notes I consulted [facility] hospitalist Dr. Kirke Corin and discussed lab and imaging findings and discussed disposition.  Cardiac monitoring reviewed, sinus rhythm Social determinants considered, no significant barriers Critical Interventions: None  After the interventions stated above, I reevaluated the patient and found patient still have increased work of breathing Admission and further testing considered, she will be admitted to the hospital for further treatment of pneumonia/COPD.  She is in agreement with plan for admission.         Final Clinical Impression(s) / ED Diagnoses Final diagnoses:  Community acquired pneumonia of right lung, unspecified part of lung  COPD exacerbation (HCC)    Rx / DC Orders ED Discharge Orders     None         Terrilee Files, MD 12/19/22 1851

## 2022-12-19 NOTE — Progress Notes (Signed)
Pt admitted from the ED for worsening SOB and nonproductive cough.  She was transferred from the ED via gurney accompanied by a transporter.  Assisted pt into her room.  Reviewed POC and made pt aware of her surroundings.  Instructed pt to utilize RN call light for assistance.  Bed locked, in lowest position with two upper side rails engaged.  Belongings and call light within reach.

## 2022-12-19 NOTE — ED Triage Notes (Signed)
Pt c.o worsening SOB this morning, hx of COPD. Used her breathing tx PTA. Productive cough. Denies chest pain or fever. Pt tachypneic.

## 2022-12-19 NOTE — ED Notes (Signed)
ED TO INPATIENT HANDOFF REPORT  ED Nurse Name and Phone #: Angelica Chessman, 88  S Name/Age/Gender Kimberly [name] 77 y.o. female Room/Bed: 001C/001C  Code Status   Code Status: Limited: Do not attempt resuscitation (DNR) -DNR-LIMITED -Do Not Intubate/DNI   Home/SNF/Other Home Patient oriented to: self, place, time, and situation Is this baseline? Yes   Triage Complete: Triage complete  Chief Complaint Community acquired pneumonia [J18.9]  Triage Note Pt c.o worsening SOB this morning, hx of COPD. Used her breathing tx PTA. Productive cough. Denies chest pain or fever. Pt tachypneic.    Allergies Allergies  Allergen Reactions   Mupirocin Shortness Of Breath and Other (See Comments)    Burning, pain, swelling and sob   Codeine Nausea Only    Level of Care/Admitting Diagnosis ED Disposition     ED Disposition  Admit   Condition  --   Comment  Hospital Area: [facility] [[id number]]  Level of Care: Med-Surg [16]  May place patient in observation at [facility] or [facility] if equivalent level of care is available:: No  Covid Evaluation: Confirmed COVID Negative  Diagnosis: Community acquired pneumonia [782956]  Admitting Physician: Steffanie Rainwater [[id number]]  Attending Physician: Steffanie Rainwater [[id number]]          B Medical/Surgery History Past Medical History:  Diagnosis Date   AICD (automatic cardioverter/defibrillator) present 03/17/2017   Anxiety    Atrial tachycardia (HCC)    Basal cell carcinoma    R ant thigh, L pretibia, both txted at [facility]   Bursitis of shoulder, right, adhesive    Cancer (HCC)    CHF (congestive heart failure) (HCC)    Chronic bronchitis (HCC)    "1-2 times/yr" (01/23/2014)   COPD (chronic obstructive pulmonary disease) (HCC)    CVD (cerebrovascular disease)    Dyslipidemia    Dysrhythmia    Frequency of urination    GERD (gastroesophageal reflux disease)    Heart murmur    History of stomach  ulcers    HTN (hypertension) 02/22/2011   Migraines    "stopped many years ago" (06/14/2014)   Osteoporosis 08/19/2016   Pericarditis    Pneumonia "10 times" (06/14/2014)   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)    Silent myocardial infarction (HCC) "late 1990's"   Stress incontinence    "was suppose to have been tacked up years ago but I didn't do it"   Syncope, near    Associated with atrial tachycardia-event recorder 1/16   Thoracic outlet syndrome    VSD (ventricular septal defect)    Past Surgical History:  Procedure Laterality Date   ABDOMINAL AORTOGRAM W/LOWER EXTREMITY N/A 12/17/2016   Procedure: ABDOMINAL AORTOGRAM W/LOWER EXTREMITY;  Surgeon: Iran Ouch, MD;  Location: [facility] INVASIVE CV LAB;  Service: Cardiovascular;  Laterality: N/A;   BIV ICD INSERTION CRT-D N/A 03/17/2017   Procedure: BIV ICD INSERTION CRT-D;  Surgeon: Marinus Maw, MD;  Location: [facility] INVASIVE CV LAB;  Service: Cardiovascular;  Laterality: N/A;   CARDIAC CATHETERIZATION  "quite a few"   CESAREAN SECTION  1972   CHOLECYSTECTOMY OPEN  1970's   CORONARY ANGIOGRAM  2000   No significant CAD   ELECTROPHYSIOLOGIC STUDY N/A 06/14/2014   Procedure: A-Flutter/A-Tach/SVT Ablation;  Surgeon: Marinus Maw, MD;  Location: [facility] INVASIVE CV LAB;  Service: Cardiovascular;  Laterality: N/A;   INSERTION OF ICD  03/17/2017   BIV   MULTIPLE TOOTH EXTRACTIONS     MYRINGOTOMY WITH  TUBE PLACEMENT Right 2015   PERIPHERAL VASCULAR INTERVENTION  12/17/2016   Procedure: PERIPHERAL VASCULAR INTERVENTION;  Surgeon: Iran Ouch, MD;  Location: [facility] INVASIVE CV LAB;  Service: Cardiovascular;;  Right common femoral PTA and Stent   RIGHT/LEFT HEART CATH AND CORONARY ANGIOGRAPHY Bilateral 11/06/2016   Procedure: RIGHT/LEFT HEART CATH AND CORONARY ANGIOGRAPHY;  Surgeon: Iran Ouch, MD;  Location: [facility] INVASIVE CV LAB;  Service: Cardiovascular;  Laterality: Bilateral;   SVT ABLATION   06/14/2014   TUBAL LIGATION  1972   VSD REPAIR  1958; 1967     A IV Location/Drains/Wounds Patient Lines/Drains/Airways Status     Active Line/Drains/Airways     Name Placement date Placement time Site Days   Peripheral IV 12/19/22 20 G 1" Right Antecubital 12/19/22  1009  Antecubital  less than 1            Intake/Output Last 24 hours No intake or output data in the 24 hours ending 12/19/22 1528  Labs/Imaging Results for orders placed or performed during the hospital encounter of 12/19/22 (from the past 48 hour(s))  Resp panel by RT-PCR (RSV, Flu A&B, Covid) Anterior Nasal Swab     Status: None   Collection Time: 12/19/22  9:59 AM   Specimen: Anterior Nasal Swab  Result Value Ref Range   SARS Coronavirus 2 by RT PCR NEGATIVE NEGATIVE   Influenza A by PCR NEGATIVE NEGATIVE   Influenza B by PCR NEGATIVE NEGATIVE    Comment: (NOTE) The Xpert Xpress SARS-CoV-2/FLU/RSV plus assay is intended as an aid in the diagnosis of influenza from Nasopharyngeal swab specimens and should not be used as a sole basis for treatment. Nasal washings and aspirates are unacceptable for Xpert Xpress SARS-CoV-2/FLU/RSV testing.  Fact Sheet for Patients: BloggerCourse.com  Fact Sheet for Healthcare Providers: SeriousBroker.it  This test is not yet approved or cleared by the [place] FDA and has been authorized for detection and/or diagnosis of SARS-CoV-2 by FDA under an Emergency Use Authorization (EUA). This EUA will remain in effect (meaning this test can be used) for the duration of the COVID-19 declaration under Section 564(b)(1) of the Act, 21 U.S.C. section 360bbb-3(b)(1), unless the authorization is terminated or revoked.     Resp Syncytial Virus by PCR NEGATIVE NEGATIVE    Comment: (NOTE) Fact Sheet for Patients: BloggerCourse.com  Fact Sheet for Healthcare  Providers: SeriousBroker.it  This test is not yet approved or cleared by the [place] FDA and has been authorized for detection and/or diagnosis of SARS-CoV-2 by FDA under an Emergency Use Authorization (EUA). This EUA will remain in effect (meaning this test can be used) for the duration of the COVID-19 declaration under Section 564(b)(1) of the Act, 21 U.S.C. section 360bbb-3(b)(1), unless the authorization is terminated or revoked.  Performed at [facility], [address] [place], [place] [address]   Basic metabolic panel     Status: Abnormal   Collection Time: 12/19/22 10:00 AM  Result Value Ref Range   Sodium 137 135 - 145 mmol/L   Potassium 4.4 3.5 - 5.1 mmol/L   Chloride 102 98 - 111 mmol/L   CO2 26 22 - 32 mmol/L   Glucose, Bld 132 (H) 70 - 99 mg/dL    Comment: Glucose reference range applies only to samples taken after fasting for at least 8 hours.   BUN 23 8 - 23 mg/dL   Creatinine, Ser 4.03 (H) 0.44 - 1.00 mg/dL   Calcium 9.0 8.9 - 47.4 mg/dL  GFR, Estimated 48 (L) >60 mL/min    Comment: (NOTE) Calculated using the CKD-EPI Creatinine Equation (2021)    Anion gap 9 5 - 15    Comment: Performed at [facility], [address] [place], [place] [address]  CBC with Differential/Platelet     Status: None   Collection Time: 12/19/22 10:00 AM  Result Value Ref Range   WBC 8.3 4.0 - 10.5 K/uL   RBC 4.09 3.87 - 5.11 MIL/uL   Hemoglobin 12.7 12.0 - 15.0 g/dL   HCT 91.4 78.2 - 95.6 %   MCV 94.9 80.0 - 100.0 fL   MCH 31.1 26.0 - 34.0 pg   MCHC 32.7 30.0 - 36.0 g/dL   RDW 21.3 08.6 - 57.8 %   Platelets 267 150 - 400 K/uL   nRBC 0.0 0.0 - 0.2 %   Neutrophils Relative % 77 %   Neutro Abs 6.4 1.7 - 7.7 K/uL   Lymphocytes Relative 11 %   Lymphs Abs 0.9 0.7 - 4.0 K/uL   Monocytes Relative 8 %   Monocytes Absolute 0.7 0.1 - 1.0 K/uL   Eosinophils Relative 3 %   Eosinophils Absolute 0.3 0.0 - 0.5 K/uL   Basophils Relative 1 %    Basophils Absolute 0.1 0.0 - 0.1 K/uL   Immature Granulocytes 0 %   Abs Immature Granulocytes 0.02 0.00 - 0.07 K/uL    Comment: Performed at [facility], [address], [place], [place] [address]  Brain natriuretic peptide     Status: Abnormal   Collection Time: 12/19/22 10:00 AM  Result Value Ref Range   B Natriuretic Peptide 472.4 (H) 0.0 - 100.0 pg/mL    Comment: Performed at [facility], [address], [place], [place] [address]  I-Stat venous blood gas, ED     Status: Abnormal   Collection Time: 12/19/22 10:10 AM  Result Value Ref Range   pH, Ven 7.354 7.25 - 7.43   pCO2, Ven 51.1 44 - 60 mmHg   pO2, Ven 32 32 - 45 mmHg   Bicarbonate 28.4 (H) 20.0 - 28.0 mmol/L   TCO2 30 22 - 32 mmol/L   O2 Saturation 58 %   Acid-Base Excess 2.0 0.0 - 2.0 mmol/L   Sodium 138 135 - 145 mmol/L   Potassium 4.3 3.5 - 5.1 mmol/L   Calcium, Ion 1.16 1.15 - 1.40 mmol/L   HCT 37.0 36.0 - 46.0 %   Hemoglobin 12.6 12.0 - 15.0 g/dL   Sample type VENOUS    Comment NOTIFIED PHYSICIAN    CUP PACEART REMOTE DEVICE CHECK  Result Date: 12/19/2022 Scheduled remote reviewed. Normal device function.  Within the monitoring period, HF diagnostics have been normal.  2 AHR detections lasting less than 5 seconds. Next remote 91 days. - CS, CVRS  CT Chest Wo Contrast  Result Date: 12/19/2022 CLINICAL DATA:  Pneumonia, complication suspected, xray done. Shortness of breath. EXAM: CT CHEST WITHOUT CONTRAST TECHNIQUE: Multidetector CT imaging of the chest was performed following the standard protocol without IV contrast. RADIATION DOSE REDUCTION: This exam was performed according to the departmental dose-optimization program which includes automated exposure control, adjustment of the mA and/or kV according to patient size and/or use of iterative reconstruction technique. COMPARISON:  CT scan chest from 10/27/2022 FINDINGS: Cardiovascular: Normal cardiac size. No pericardial effusion. No aortic  aneurysm. There are coronary artery calcifications, in keeping with coronary artery disease. There are also moderate peripheral atherosclerotic vascular calcifications of thoracic aorta  and its major branches. Mediastinum/Nodes: Visualized thyroid gland appears grossly unremarkable. No solid / cystic mediastinal masses. The esophagus is nondistended precluding optimal assessment. No axillary, mediastinal or hilar lymphadenopathy by size criteria. Lungs/Pleura: The central tracheo-bronchial tree is patent. There are occlusive and nonocclusive filling defects in the bilateral lower lobe bronchial tree, likely due to mucus/secretions or aspiration There are patchy areas of linear, plate-like atelectasis and/or scarring throughout bilateral lungs. Mild upper lobe predominant centrilobular emphysema noted. There is new, heterogeneous opacity in the right upper lobe, abutting the minor fissure (, series 6, image 45), with associated ground-glass opacities in the right upper lobe, concerning for focal pneumonitis. No mass or consolidation. No pleural effusion or pneumothorax. No suspicious lung nodules. Upper Abdomen: Surgically absent gallbladder. Remaining visualized upper abdominal viscera within normal limits. Musculoskeletal: . A dual lead cardiac pacemaker / defibrillator is noted with its battery pack overlying the right chest wall and the leads terminating in the right atrium and apex of right ventricle. Visualized soft tissues of the chest wall are otherwise grossly unremarkable. No suspicious osseous lesions. There are mild multilevel degenerative changes in the visualized spine. IMPRESSION: *There is a new, heterogeneous opacity in the right upper lobe abutting the minor fissure with associated ground-glass opacities in the right upper lobe, concerning for focal pneumonitis follow-up to clearing is recommended. *Multiple other nonacute observations, as described above. Aortic Atherosclerosis (ICD10-I70.0) and  Emphysema (ICD10-J43.9). Electronically Signed   By: Jules Schick M.D.   On: 12/19/2022 12:54   DG Chest Port 1 View  Result Date: 12/19/2022 CLINICAL DATA:  Shortness of breath.  History of COPD. EXAM: PORTABLE CHEST 1 VIEW COMPARISON:  Chest radiographs 04/24/2022 and CT 10/27/2022 FINDINGS: A right subclavian approach ICD remains in place. The cardiomediastinal silhouette is unchanged normal heart size. Sternal wires are again noted. The lungs remain hyperinflated. Asymmetric interstitial type opacities in the right upper lobe are new from the prior radiographs. No sizable pleural effusion or pneumothorax is identified. No acute osseous abnormality is seen. IMPRESSION: New mild right upper lobe opacity which may reflect early pneumonia. Electronically Signed   By: Sebastian Ache M.D.   On: 12/19/2022 10:16    Pending Labs Unresulted Labs (From admission, onward)     Start     Ordered   12/19/22 1306  Culture, blood (routine x 2)  BLOOD CULTURE X 2,   R (with STAT occurrences)      12/19/22 1305            Vitals/Pain Today's Vitals   12/19/22 1000 12/19/22 1045 12/19/22 1115 12/19/22 1145  BP: (!) 101/51 (!) 106/50 (!) 100/48 (!) 101/45  Pulse: 65 65 (!) 59 61  Resp: (!) 28 (!) 22 20 (!) 22  Temp:      TempSrc:      SpO2: 93% 93% 91% 94%  Weight:      Height:      PainSc:        Isolation Precautions No active isolations  Medications Medications  azithromycin (ZITHROMAX) 500 mg in sodium chloride 0.9 % 250 mL IVPB (has no administration in time range)  enoxaparin (LOVENOX) injection 30 mg (has no administration in time range)  acetaminophen (TYLENOL) tablet 650 mg (has no administration in time range)    Or  acetaminophen (TYLENOL) suppository 650 mg (has no administration in time range)  senna-docusate (Senokot-S) tablet 1 tablet (has no administration in time range)  ondansetron (ZOFRAN) tablet 4 mg (has no administration in time  range)    Or  ondansetron (ZOFRAN)  injection 4 mg (has no administration in time range)  ipratropium-albuterol (DUONEB) 0.5-2.5 (3) MG/3ML nebulizer solution 3 mL (has no administration in time range)  predniSONE (DELTASONE) tablet 40 mg (has no administration in time range)  methylPREDNISolone sodium succinate (SOLU-MEDROL) 125 mg/2 mL injection 125 mg (has no administration in time range)  ALPRAZolam (XANAX) tablet 0.5 mg (has no administration in time range)  aspirin chewable tablet 81 mg (has no administration in time range)  bisoprolol (ZEBETA) tablet 2.5 mg (has no administration in time range)  digoxin (LANOXIN) tablet 0.0625 mg (has no administration in time range)  rosuvastatin (CRESTOR) tablet 10 mg (has no administration in time range)  sacubitril-valsartan (ENTRESTO) 24-26 mg per tablet (has no administration in time range)  spironolactone (ALDACTONE) tablet 25 mg (has no administration in time range)  ipratropium-albuterol (DUONEB) 0.5-2.5 (3) MG/3ML nebulizer solution 3 mL (3 mLs Nebulization Given 12/19/22 1015)  magnesium sulfate IVPB 2 g 50 mL (0 g Intravenous Stopped 12/19/22 1115)  cefTRIAXone (ROCEPHIN) 1 g in sodium chloride 0.9 % 100 mL IVPB (0 g Intravenous Stopped 12/19/22 1455)    Mobility walks     Focused Assessments Pulmonary Assessment Handoff:  Lung sounds:   O2 Device: Room Air      R Recommendations: See Admitting Provider Note  Report given to:   Additional Notes: PT is AOX4, walky talky, increased shortness of breath on exertion, family has been at bedside

## 2022-12-19 NOTE — Progress Notes (Signed)
TRH night cross cover note:   I was notified by RN, that this patient, who has been full code during previous hospitalizations, but elected to be DNR at the time of today's admission.  However, patient now having uncertainty as to whether or not she wishes to remain DNR.   Given her uncertainty of preference of code status, I've changed her to full code for now, pending further code status clarification/discussions.     Newton Pigg, DO Hospitalist

## 2022-12-20 DIAGNOSIS — J189 Pneumonia, unspecified organism: Secondary | ICD-10-CM | POA: Diagnosis not present

## 2022-12-20 LAB — EXPECTORATED SPUTUM ASSESSMENT W GRAM STAIN, RFLX TO RESP C

## 2022-12-20 LAB — GLUCOSE, CAPILLARY: Glucose-Capillary: 132 mg/dL — ABNORMAL HIGH (ref 70–99)

## 2022-12-20 LAB — BASIC METABOLIC PANEL
Anion gap: 7 (ref 5–15)
Anion gap: 10 (ref 5–15)
BUN: 21 mg/dL (ref 8–23)
BUN: 23 mg/dL (ref 8–23)
CO2: 26 mmol/L (ref 22–32)
CO2: 25 mmol/L (ref 22–32)
Calcium: 9 mg/dL (ref 8.9–10.3)
Calcium: 9.1 mg/dL (ref 8.9–10.3)
Chloride: 106 mmol/L (ref 98–111)
Chloride: 101 mmol/L (ref 98–111)
Creatinine, Ser: 1.04 mg/dL — ABNORMAL HIGH (ref 0.44–1.00)
Creatinine, Ser: 1.09 mg/dL — ABNORMAL HIGH (ref 0.44–1.00)
GFR, Estimated: 56 mL/min — ABNORMAL LOW (ref >60)
GFR, Estimated: 53 mL/min — ABNORMAL LOW (ref >60)
Glucose, Bld: 164 mg/dL — ABNORMAL HIGH (ref 70–99)
Glucose, Bld: 166 mg/dL — ABNORMAL HIGH (ref 70–99)
Potassium: 5.6 mmol/L — ABNORMAL HIGH (ref 3.5–5.1)
Potassium: 5.2 mmol/L — ABNORMAL HIGH (ref 3.5–5.1)
Sodium: 139 mmol/L (ref 135–145)
Sodium: 136 mmol/L (ref 135–145)

## 2022-12-20 LAB — MAGNESIUM: Magnesium: 2.4 mg/dL (ref 1.7–2.4)

## 2022-12-20 LAB — CBC
HCT: 38.9 % (ref 36.0–46.0)
Hemoglobin: 12.5 g/dL (ref 12.0–15.0)
MCH: 30.9 pg (ref 26.0–34.0)
MCHC: 32.1 g/dL (ref 30.0–36.0)
MCV: 96.3 fL (ref 80.0–100.0)
Platelets: 246 10*3/uL (ref 150–400)
RBC: 4.04 MIL/uL (ref 3.87–5.11)
RDW: 13 % (ref 11.5–15.5)
WBC: 4.8 10*3/uL (ref 4.0–10.5)
nRBC: 0 % (ref 0.0–0.2)

## 2022-12-20 LAB — PHOSPHORUS: Phosphorus: 4.6 mg/dL (ref 2.5–4.6)

## 2022-12-20 LAB — PROCALCITONIN: Procalcitonin: <0.1 ng/mL

## 2022-12-20 MED ORDER — HYDROCODONE BIT-HOMATROP MBR 5-1.5 MG/5ML PO SOLN
5.0000 mL | Freq: Three times a day (TID) | ORAL | Status: DC | PRN
  Administered 2022-12-20 – 2022-12-22 (×3): 5 mL via ORAL
  Filled 2022-12-20 (×3): qty 5

## 2022-12-20 MED ORDER — SODIUM ZIRCONIUM CYCLOSILICATE 10 G PO PACK
10.0000 g | PACK | Freq: Once | ORAL | Status: AC
  Administered 2022-12-20: 10 g via ORAL
  Filled 2022-12-20: qty 1

## 2022-12-20 MED ORDER — FUROSEMIDE 40 MG PO TABS
40.0000 mg | ORAL_TABLET | Freq: Every day | ORAL | Status: DC
  Administered 2022-12-21 – 2022-12-22 (×2): 40 mg via ORAL
  Filled 2022-12-20 (×2): qty 1

## 2022-12-20 MED ORDER — SALINE SPRAY 0.65 % NA SOLN
1.0000 | NASAL | Status: DC | PRN
  Filled 2022-12-20: qty 44

## 2022-12-20 NOTE — Care Management Obs Status (Signed)
MEDICARE OBSERVATION STATUS NOTIFICATION   Patient Details  Name: Kimberly [name] MRN: [id number] Date of Birth: [birth year]   Medicare Observation Status Notification Given:  Yes    Lawerance Sabal, RN 12/20/2022, 3:12 PM

## 2022-12-20 NOTE — Plan of Care (Signed)
  Problem: Health Behavior/Discharge Planning: Goal: Ability to manage health-related needs will improve Outcome: Progressing   Problem: Education: Goal: Knowledge of General Education information will improve Description: Including pain rating scale, medication(s)/side effects and non-pharmacologic comfort measures Outcome: Not Progressing

## 2022-12-20 NOTE — Progress Notes (Signed)
TRH night cross cover note:   I was notified by RN of the patient's elevated potassium level this morning of 5.6, up from yesterday's value of 4.4.  1.04 compared to 1.19 yesterday.  Magnesium 2.4.  For now, I have held current orders for spironolactone and Entresto and have ordered Lokelma 10 g p.o. x 1 dose now, as well as a repeat BMP to occur at 10 AM this morning.     Newton Pigg, DO Hospitalist

## 2022-12-20 NOTE — Progress Notes (Signed)
TRH night cross cover note:   I was notified by RN the patient is experiencing cough refractory to existing orders for dextromethorphan as well as prn Jerilynn Som, with the patient requesting Hycodan instead as her anti-tussive.   I subsequently d\c'ed her scheduled dextromethorphan and  prn Tessalon Perles and ordered prn Hycodan.      Newton Pigg, DO Hospitalist

## 2022-12-20 NOTE — Progress Notes (Signed)
PROGRESS NOTE    Kimberly [name]  WUJ:[id number] DOB: [birth year] DOA: 12/19/2022 PCP: Corwin Levins, MD  Chief Complaint  Patient presents with   Shortness of [facility] Course:  Kimberly [name] is 77 y.o. female with COPD, anxiety, CHF, HLD, hypertension, GERD, CAD, PAD, who presented to the ED with dyspnea and productive cough.  Chest x-ray on arrival concerning for a right upper lung opacity, CT consistent with focal pneumonitis.  Patient was admitted for treatment of community-acquired pneumonia and COPD exacerbation.  Subjective: Evaluation today patient is still dyspneic.  Her husband is at bedside.  She is anxious to get home but understands that she is deconditioned compared to her baseline.    Objective: Vitals:   12/20/22 0018 12/20/22 0258 12/20/22 0457 12/20/22 0758  BP: (!) 113/49 110/60 (!) 126/45 (!) 106/46  Pulse: (!) 59  63 61  Resp: 18 (!) 22 19 (!) 32  Temp: 97.9 F (36.6 C)  97.8 F (36.6 C) 97.8 F (36.6 C)  TempSrc: Oral  Oral Oral  SpO2: 98% 92% 100% 97%  Weight:      Height:        Intake/Output Summary (Last 24 hours) at 12/20/2022 0908 Last data filed at 12/20/2022 0758 Gross per 24 hour  Intake 720 ml  Output --  Net 720 ml   Filed Weights   12/19/22 0921  Weight: 48.5 kg    Examination: General exam: Appears calm and comfortable, NAD Respiratory system: Tachypnea, shallow respirations, poor aeration bilaterally, diffuse end expiratory wheezing. 2L O2 via [place]. Cardiovascular system: S1 & S2 heard, RRR.  Gastrointestinal system: Abdomen is nondistended, soft and nontender.  Neuro: Alert and oriented. No focal neurological deficits. Extremities: Symmetric, expected ROM Skin: No rashes, lesions Psychiatry: Demonstrates appropriate judgement and insight. Mood & affect appropriate for situation.   Assessment & Plan:  Principal Problem:   Community acquired pneumonia Active Problems:   COPD exacerbation (HCC)  Community acquired  pneumonia - Superimposed on history of COPD. - Flu/COVID/RSV negative. - Continue with Rocephin and azithromycin for now - Symptomatic support - Continue oxygen, wean as tolerated - Follow-up blood culture, sputum culture - Follow CBC and fever curve  COPD exacerbation - Status post IV Solu-Medrol x 1 - Continue with prednisone 40 daily for now, initiate taper as tolerated -- She is on chronic prednisone therapy outpatient - Continue Breo Ellipta daily - Scheduled DuoNebs - Continue to wean oxygen as tolerated - Patient is not on baseline O2 - Incentive spirometry  Hyperkalemia - Suspect hyperkalemia is due to patient's Entresto and spironolactone with the absence of Lasix which she has not been taking recently. - Hold Entresto and spironolactone for now - [employer] today. - BMP in AM  Heart failure with reduced EF - Has extensive history of cardiomyopathy, VSD repair as a child, - Last TTE 9/24: EF 40%, anterior septal akinesis, grade 1 diastolic dysfunction, no valvular abnormalities - Patient takes Lasix as needed at home. - BNP 472 on arrival, patient clinically euvolemic - Noted AICD and pacemaker - Blood pressure and heart rate are low.  Proceed with home doses digoxin, bisoprolol, as blood pressure.pulse tolerates.  Too low this morning for meds  HLD Peripheral arterial disease CAD - Continue home dose statin and aspirin  Anxiety - Continue daily alprazolam at bedtime, home med.  Cirrhosis - Incidentally seen on outpatient imaging -- Has been referred to GI for their workup outpatient  Tobacco abuse - Quit  earlier this year - Follows with pulmonology for lung cancer screening        DVT prophylaxis: heparin Code Status: Full Family Communication: Husband at bedside and present for discuss Disposition:  Status is: Observation The patient remains OBS appropriate and will d/c before 2 midnights.    Consultants:  none Antimicrobials:   Anti-infectives (From admission, onward)    Start     Dose/Rate Route Frequency Ordered Stop   12/20/22 1300  cefTRIAXone (ROCEPHIN) 1 g in sodium chloride 0.9 % 100 mL IVPB  Status:  Discontinued        1 g 200 mL/hr over 30 Minutes Intravenous Every 24 hours 12/19/22 2006 12/19/22 2007   12/20/22 1300  azithromycin (ZITHROMAX) tablet 500 mg        500 mg Oral Daily 12/19/22 2006 12/24/22 0959   12/20/22 1300  cefTRIAXone (ROCEPHIN) 2 g in sodium chloride 0.9 % 100 mL IVPB        2 g 200 mL/hr over 30 Minutes Intravenous Every 24 hours 12/19/22 2007     12/19/22 1315  cefTRIAXone (ROCEPHIN) 1 g in sodium chloride 0.9 % 100 mL IVPB        1 g 200 mL/hr over 30 Minutes Intravenous  Once 12/19/22 1305 12/19/22 1455   12/19/22 1315  azithromycin (ZITHROMAX) 500 mg in sodium chloride 0.9 % 250 mL IVPB        500 mg 250 mL/hr over 60 Minutes Intravenous  Once 12/19/22 1305 12/19/22 1709       Data Reviewed: I have personally reviewed following labs and imaging studies CBC: Recent Labs  Lab 12/19/22 1000 12/19/22 1010 12/20/22 0358  WBC 8.3  --  4.8  NEUTROABS 6.4  --   --   HGB 12.7 12.6 12.5  HCT 38.8 37.0 38.9  MCV 94.9  --  96.3  PLT 267  --  246   Basic Metabolic Panel: Recent Labs  Lab 12/19/22 1000 12/19/22 1010 12/20/22 0358  NA 137 138 139  K 4.4 4.3 5.6*  CL 102  --  106  CO2 26  --  26  GLUCOSE 132*  --  164*  BUN 23  --  21  CREATININE 1.19*  --  1.04*  CALCIUM 9.0  --  9.0  MG  --   --  2.4  PHOS  --   --  4.6   GFR: Estimated Creatinine Clearance: 35.3 mL/min (A) (by C-G formula based on SCr of 1.04 mg/dL (H)). Liver Function Tests: No results for input(s): "AST", "ALT", "ALKPHOS", "BILITOT", "PROT", "ALBUMIN" in the last 168 hours. CBG: No results for input(s): "GLUCAP" in the last 168 hours.  Recent Results (from the past 240 hour(s))  Resp panel by RT-PCR (RSV, Flu A&B, Covid) Anterior Nasal Swab     Status: None   Collection Time: 12/19/22   9:59 AM   Specimen: Anterior Nasal Swab  Result Value Ref Range Status   SARS Coronavirus 2 by RT PCR NEGATIVE NEGATIVE Final   Influenza A by PCR NEGATIVE NEGATIVE Final   Influenza B by PCR NEGATIVE NEGATIVE Final    Comment: (NOTE) The Xpert Xpress SARS-CoV-2/FLU/RSV plus assay is intended as an aid in the diagnosis of influenza from Nasopharyngeal swab specimens and should not be used as a sole basis for treatment. Nasal washings and aspirates are unacceptable for Xpert Xpress SARS-CoV-2/FLU/RSV testing.  Fact Sheet for Patients: BloggerCourse.com  Fact Sheet for Healthcare Providers: SeriousBroker.it  This test is not  yet approved or cleared by the [place] and has been authorized for detection and/or diagnosis of SARS-CoV-2 by FDA under an Emergency Use Authorization (EUA). This EUA will remain in effect (meaning this test can be used) for the duration of the COVID-19 declaration under Section 564(b)(1) of the Act, 21 U.S.C. section 360bbb-3(b)(1), unless the authorization is terminated or revoked.     Resp Syncytial Virus by PCR NEGATIVE NEGATIVE Final    Comment: (NOTE) Fact Sheet for Patients: BloggerCourse.com  Fact Sheet for Healthcare Providers: SeriousBroker.it  This test is not yet approved or cleared by the [place] FDA and has been authorized for detection and/or diagnosis of SARS-CoV-2 by FDA under an Emergency Use Authorization (EUA). This EUA will remain in effect (meaning this test can be used) for the duration of the COVID-19 declaration under Section 564(b)(1) of the Act, 21 U.S.C. section 360bbb-3(b)(1), unless the authorization is terminated or revoked.  Performed at [facility], [address] [place], [place] [address]   Culture, blood (routine x 2)     Status: None (Preliminary result)   Collection Time: 12/19/22  2:10 PM    Specimen: BLOOD  Result Value Ref Range Status   Specimen Description BLOOD LEFT ANTECUBITAL  Final   Special Requests   Final    BOTTLES DRAWN AEROBIC AND ANAEROBIC Blood Culture adequate volume   Culture   Final    NO GROWTH < 24 HOURS Performed at [facility], [address], [place], [place] [address]    Report Status PENDING  Incomplete  Culture, blood (routine x 2)     Status: None (Preliminary result)   Collection Time: 12/19/22  4:48 PM   Specimen: BLOOD  Result Value Ref Range Status   Specimen Description BLOOD BLOOD RIGHT HAND  Final   Special Requests   Final    BOTTLES DRAWN AEROBIC AND ANAEROBIC Blood Culture adequate volume   Culture   Final    NO GROWTH < 24 HOURS Performed at [facility], [address] [place], [place] [address]    Report Status PENDING  Incomplete  Expectorated Sputum Assessment w Gram Stain, Rflx to Resp Cult     Status: None   Collection Time: 12/19/22  8:06 PM   Specimen: Expectorated Sputum  Result Value Ref Range Status   Specimen Description EXPECTORATED SPUTUM  Final   Special Requests NONE  Final   Sputum evaluation   Final    THIS SPECIMEN IS ACCEPTABLE FOR SPUTUM CULTURE Performed at [facility], [address], [place], [place] [address]    Report Status 12/20/2022 FINAL  Final  Culture, Respiratory w Gram Stain     Status: None (Preliminary result)   Collection Time: 12/19/22  8:06 PM  Result Value Ref Range Status   Specimen Description EXPECTORATED SPUTUM  Final   Special Requests NONE Reflexed from [id number]  Final   Gram Stain   Final    RARE WBC PRESENT, PREDOMINANTLY PMN FEW GRAM POSITIVE COCCI Performed at [facility], [address] [place], [place] [address]    Culture PENDING  Incomplete   Report Status PENDING  Incomplete     Radiology Studies: CT Chest Wo Contrast  Result Date: 12/19/2022 CLINICAL DATA:  Pneumonia, complication suspected, xray done. Shortness of breath. EXAM: CT  CHEST WITHOUT CONTRAST TECHNIQUE: Multidetector CT imaging of the chest was performed following the standard protocol without IV contrast. RADIATION DOSE REDUCTION: This  exam was performed according to the departmental dose-optimization program which includes automated exposure control, adjustment of the mA and/or kV according to patient size and/or use of iterative reconstruction technique. COMPARISON:  CT scan chest from 10/27/2022 FINDINGS: Cardiovascular: Normal cardiac size. No pericardial effusion. No aortic aneurysm. There are coronary artery calcifications, in keeping with coronary artery disease. There are also moderate peripheral atherosclerotic vascular calcifications of thoracic aorta and its major branches. Mediastinum/Nodes: Visualized thyroid gland appears grossly unremarkable. No solid / cystic mediastinal masses. The esophagus is nondistended precluding optimal assessment. No axillary, mediastinal or hilar lymphadenopathy by size criteria. Lungs/Pleura: The central tracheo-bronchial tree is patent. There are occlusive and nonocclusive filling defects in the bilateral lower lobe bronchial tree, likely due to mucus/secretions or aspiration There are patchy areas of linear, plate-like atelectasis and/or scarring throughout bilateral lungs. Mild upper lobe predominant centrilobular emphysema noted. There is new, heterogeneous opacity in the right upper lobe, abutting the minor fissure (, series 6, image 45), with associated ground-glass opacities in the right upper lobe, concerning for focal pneumonitis. No mass or consolidation. No pleural effusion or pneumothorax. No suspicious lung nodules. Upper Abdomen: Surgically absent gallbladder. Remaining visualized upper abdominal viscera within normal limits. Musculoskeletal: . A dual lead cardiac pacemaker / defibrillator is noted with its battery pack overlying the right chest wall and the leads terminating in the right atrium and apex of right ventricle.  Visualized soft tissues of the chest wall are otherwise grossly unremarkable. No suspicious osseous lesions. There are mild multilevel degenerative changes in the visualized spine. IMPRESSION: *There is a new, heterogeneous opacity in the right upper lobe abutting the minor fissure with associated ground-glass opacities in the right upper lobe, concerning for focal pneumonitis follow-up to clearing is recommended. *Multiple other nonacute observations, as described above. Aortic Atherosclerosis (ICD10-I70.0) and Emphysema (ICD10-J43.9). Electronically Signed   By: Jules Schick M.D.   On: 12/19/2022 12:54   DG Chest Port 1 View  Result Date: 12/19/2022 CLINICAL DATA:  Shortness of breath.  History of COPD. EXAM: PORTABLE CHEST 1 VIEW COMPARISON:  Chest radiographs 04/24/2022 and CT 10/27/2022 FINDINGS: A right subclavian approach ICD remains in place. The cardiomediastinal silhouette is unchanged normal heart size. Sternal wires are again noted. The lungs remain hyperinflated. Asymmetric interstitial type opacities in the right upper lobe are new from the prior radiographs. No sizable pleural effusion or pneumothorax is identified. No acute osseous abnormality is seen. IMPRESSION: New mild right upper lobe opacity which may reflect early pneumonia. Electronically Signed   By: Sebastian Ache M.D.   On: 12/19/2022 10:16    Scheduled Meds:  ALPRAZolam  0.5 mg Oral QHS   aspirin  81 mg Oral Daily   azithromycin  500 mg Oral Daily   bisoprolol  2.5 mg Oral Daily   digoxin  0.0625 mg Oral Daily   enoxaparin (LOVENOX) injection  30 mg Subcutaneous Q24H   fluticasone furoate-vilanterol  1 puff Inhalation Daily   ipratropium-albuterol  3 mL Nebulization Q6H   predniSONE  40 mg Oral Q breakfast   rosuvastatin  10 mg Oral Daily   Continuous Infusions:  cefTRIAXone (ROCEPHIN)  IV       LOS: 0 days    Time spent:   Debarah Crape, DO [facility]  To contact the attending physician  between 7A-7P please use Epic Chat. To contact the covering physician during after hours 7P-7A, please review Amion.   12/20/2022, 9:08 AM

## 2022-12-20 NOTE — Plan of Care (Signed)
Problem: Education: Goal: Knowledge of General Education information will improve Description: Including pain rating scale, medication(s)/side effects and non-pharmacologic comfort measures Outcome: Progressing Pt understands she was admitted into the hospitalfor SOB and nonproductive cough. She has an admitting diagnosis of CAPNA.  Pt will be in IV abx per MD's orders.    Problem: Clinical Measurements: Goal: Will remain free from infection Outcome: Progressing S/Sx of infection monitored and assessed q-shift.  Pt has remained afebrile thus far.  She will be in IV abx per MD's orders.    Problem: Clinical Measurements: Goal: Respiratory complications will improve Outcome: Progressing Respiratory status monitored and assessed q-shift.  Pt is on 2L of O2 via nasal cannula with PO2 at 92-100% and respiration rate of 18-32 breaths per minute.  Pt has endorsed c/o SOB or DOE.    Problem: Clinical Measurements: Goal: Cardiovascular complication will be avoided Outcome: Progressing Pt VS are abnormal d/t her respiratory status and BP.  MDs are aware.    Problem: Activity: Goal: Risk for activity intolerance will decrease Outcome: Progressing Pt is independent of all her ADLs.  She does not needs the assistance of RN staff to help her OOB.       Problem: Nutrition: Goal: Adequate nutrition will be maintained Outcome: Progressing Pt is on a heart healthy diet per MD's orders and can tolerate it w/o s/sx of abdominal pain/ distention or n/v.     Problem: Safety: Goal: Ability to remain free from injury will improve Outcome: Progressing Pt has remained free from falls thus far.  Instructed pt to utilize RN call light for assistance.  Hourly rounds performed. Bed in lowest position, locked with two upper side rails engaged.  Belongings and call light within reach.    Problem: Skin Integrity: Goal: Risk for impaired skin integrity will decrease Outcome: Progressing Skin integrity monitored  and assessed q-shift.  Instructed pt to q2 hours to prevent further skin impairment.  Tubes and drains assessed for device related pressures sores.  Pt is continent of both bowel and bladder.     Problem: Pain Managment: Goal: General experience of comfort will improve Outcome: Progressing Pt has 7-9/10 headache describing it as a constant ache.  Reiterated pain scale so she could adequately rate her pain.  Pt stated her pain goal would be 0/10.  Discussed nonpharmacological methods to help reduce s/sx of pain.  Interventions given per pt's request and MD's orders.

## 2022-12-21 DIAGNOSIS — K7460 Unspecified cirrhosis of liver: Secondary | ICD-10-CM | POA: Diagnosis present

## 2022-12-21 DIAGNOSIS — Z66 Do not resuscitate: Secondary | ICD-10-CM | POA: Diagnosis present

## 2022-12-21 DIAGNOSIS — Z883 Allergy status to other anti-infective agents status: Secondary | ICD-10-CM | POA: Diagnosis not present

## 2022-12-21 DIAGNOSIS — J441 Chronic obstructive pulmonary disease with (acute) exacerbation: Secondary | ICD-10-CM | POA: Diagnosis present

## 2022-12-21 DIAGNOSIS — I739 Peripheral vascular disease, unspecified: Secondary | ICD-10-CM | POA: Diagnosis present

## 2022-12-21 DIAGNOSIS — Z823 Family history of stroke: Secondary | ICD-10-CM | POA: Diagnosis not present

## 2022-12-21 DIAGNOSIS — R0602 Shortness of breath: Secondary | ICD-10-CM | POA: Diagnosis present

## 2022-12-21 DIAGNOSIS — I110 Hypertensive heart disease with heart failure: Secondary | ICD-10-CM | POA: Diagnosis present

## 2022-12-21 DIAGNOSIS — E785 Hyperlipidemia, unspecified: Secondary | ICD-10-CM | POA: Diagnosis present

## 2022-12-21 DIAGNOSIS — Z79899 Other long term (current) drug therapy: Secondary | ICD-10-CM | POA: Diagnosis not present

## 2022-12-21 DIAGNOSIS — J439 Emphysema, unspecified: Secondary | ICD-10-CM | POA: Diagnosis present

## 2022-12-21 DIAGNOSIS — J440 Chronic obstructive pulmonary disease with acute lower respiratory infection: Secondary | ICD-10-CM | POA: Diagnosis present

## 2022-12-21 DIAGNOSIS — Z87891 Personal history of nicotine dependence: Secondary | ICD-10-CM | POA: Diagnosis not present

## 2022-12-21 DIAGNOSIS — Z7982 Long term (current) use of aspirin: Secondary | ICD-10-CM | POA: Diagnosis not present

## 2022-12-21 DIAGNOSIS — Z1152 Encounter for screening for COVID-19: Secondary | ICD-10-CM | POA: Diagnosis not present

## 2022-12-21 DIAGNOSIS — K219 Gastro-esophageal reflux disease without esophagitis: Secondary | ICD-10-CM | POA: Diagnosis present

## 2022-12-21 DIAGNOSIS — Z85828 Personal history of other malignant neoplasm of skin: Secondary | ICD-10-CM | POA: Diagnosis not present

## 2022-12-21 DIAGNOSIS — Z8249 Family history of ischemic heart disease and other diseases of the circulatory system: Secondary | ICD-10-CM | POA: Diagnosis not present

## 2022-12-21 DIAGNOSIS — J189 Pneumonia, unspecified organism: Secondary | ICD-10-CM | POA: Diagnosis present

## 2022-12-21 DIAGNOSIS — Z95810 Presence of automatic (implantable) cardiac defibrillator: Secondary | ICD-10-CM | POA: Diagnosis not present

## 2022-12-21 DIAGNOSIS — Z7952 Long term (current) use of systemic steroids: Secondary | ICD-10-CM | POA: Diagnosis not present

## 2022-12-21 DIAGNOSIS — I5023 Acute on chronic systolic (congestive) heart failure: Secondary | ICD-10-CM | POA: Diagnosis present

## 2022-12-21 DIAGNOSIS — F419 Anxiety disorder, unspecified: Secondary | ICD-10-CM | POA: Diagnosis present

## 2022-12-21 DIAGNOSIS — I2510 Atherosclerotic heart disease of native coronary artery without angina pectoris: Secondary | ICD-10-CM | POA: Diagnosis present

## 2022-12-21 DIAGNOSIS — Z885 Allergy status to narcotic agent status: Secondary | ICD-10-CM | POA: Diagnosis not present

## 2022-12-21 LAB — CBC WITH DIFFERENTIAL/PLATELET
Abs Immature Granulocytes: 0.04 10*3/uL (ref 0.00–0.07)
Basophils Absolute: 0 10*3/uL (ref 0.0–0.1)
Basophils Relative: 0 %
Eosinophils Absolute: 0 10*3/uL (ref 0.0–0.5)
Eosinophils Relative: 0 %
HCT: 36.8 % (ref 36.0–46.0)
Hemoglobin: 11.7 g/dL — ABNORMAL LOW (ref 12.0–15.0)
Immature Granulocytes: 0 %
Lymphocytes Relative: 13 %
Lymphs Abs: 1.2 10*3/uL (ref 0.7–4.0)
MCH: 30.1 pg (ref 26.0–34.0)
MCHC: 31.8 g/dL (ref 30.0–36.0)
MCV: 94.6 fL (ref 80.0–100.0)
Monocytes Absolute: 0.9 10*3/uL (ref 0.1–1.0)
Monocytes Relative: 10 %
Neutro Abs: 7 10*3/uL (ref 1.7–7.7)
Neutrophils Relative %: 77 %
Platelets: 257 10*3/uL (ref 150–400)
RBC: 3.89 MIL/uL (ref 3.87–5.11)
RDW: 13.2 % (ref 11.5–15.5)
WBC: 9.2 10*3/uL (ref 4.0–10.5)
nRBC: 0 % (ref 0.0–0.2)

## 2022-12-21 LAB — PHOSPHORUS: Phosphorus: 4 mg/dL (ref 2.5–4.6)

## 2022-12-21 LAB — COMPREHENSIVE METABOLIC PANEL
ALT: 14 U/L (ref 0–44)
AST: 17 U/L (ref 15–41)
Albumin: 3.6 g/dL (ref 3.5–5.0)
Alkaline Phosphatase: 53 U/L (ref 38–126)
Anion gap: 11 (ref 5–15)
BUN: 30 mg/dL — ABNORMAL HIGH (ref 8–23)
CO2: 26 mmol/L (ref 22–32)
Calcium: 9 mg/dL (ref 8.9–10.3)
Chloride: 101 mmol/L (ref 98–111)
Creatinine, Ser: 0.98 mg/dL (ref 0.44–1.00)
GFR, Estimated: >60 mL/min (ref >60)
Glucose, Bld: 97 mg/dL (ref 70–99)
Potassium: 5.1 mmol/L (ref 3.5–5.1)
Sodium: 138 mmol/L (ref 135–145)
Total Bilirubin: 0.7 mg/dL (ref <1.2)
Total Protein: 5.9 g/dL — ABNORMAL LOW (ref 6.5–8.1)

## 2022-12-21 LAB — MAGNESIUM: Magnesium: 2.1 mg/dL (ref 1.7–2.4)

## 2022-12-21 MED ORDER — LORAZEPAM 2 MG/ML IJ SOLN: 0.5000 mg | Freq: Once | INTRAMUSCULAR | Status: DC | PRN

## 2022-12-21 MED ORDER — LIDOCAINE 5 % EX PTCH
1.0000 | MEDICATED_PATCH | CUTANEOUS | Status: DC
  Administered 2022-12-21: 1 via TRANSDERMAL
  Filled 2022-12-21: qty 1

## 2022-12-21 MED ORDER — CEFTRIAXONE SODIUM 1 G IJ SOLR
1.0000 g | Freq: Once | INTRAMUSCULAR | Status: AC
  Administered 2022-12-21: 1 g via INTRAMUSCULAR
  Filled 2022-12-21: qty 10

## 2022-12-21 MED ORDER — LIDOCAINE HCL 1 % IJ SOLN
2.1000 mL | Freq: Once | INTRAMUSCULAR | Status: DC
  Filled 2022-12-21: qty 2.1

## 2022-12-21 MED ORDER — LIDOCAINE HCL 1 % IJ SOLN
10.0000 mL | Freq: Once | INTRAMUSCULAR | Status: AC
  Administered 2022-12-21: 10 mL
  Filled 2022-12-21: qty 10

## 2022-12-21 MED ORDER — ALPRAZOLAM 0.25 MG PO TABS
0.2500 mg | ORAL_TABLET | Freq: Once | ORAL | Status: AC | PRN
  Administered 2022-12-21: 0.25 mg via ORAL
  Filled 2022-12-21: qty 1

## 2022-12-21 NOTE — Progress Notes (Signed)
TR[facility] night cross cover note:   I was notified by RN that this patient, who is hospitalized with acute COPD exacerbation and pneumonia, is feeling anxious and short of breath after ambulating to the bathroom this morning.  Respiratory rate up slightly relative to prior, with oxygen saturation in the low 90s on 3 L nasal cannula, compared to 2 L nasal cannula throughout the night shift.  She is status post as needed breathing treatment this morning.  RN conveys that the patient had a similar episode last evening after ambulating to the bathroom, which improved with a single dose of Xanax.  I subsequently placed order for a one-time additional prn dose of Xanax.      Newton Pigg, DO Hospitalist

## 2022-12-21 NOTE — Progress Notes (Signed)
PROGRESS NOTE    Kimberly [name]  UJW:[id number] DOB: [birth year] DOA: 12/19/2022 PCP: Corwin Levins, MD  Chief Complaint  Patient presents with   Shortness of [facility] Course:  Kimberly [name] is 77 y.o. female with COPD, anxiety, CHF, HLD, hypertension, GERD, CAD, PAD, who presented to the ED with dyspnea and productive cough.  Chest x-ray on arrival concerning for a right upper lung opacity, CT consistent with focal pneumonitis.  Patient was admitted for treatment of community-acquired pneumonia and COPD exacerbation.  Subjective: Patient had some issues with anxiety overnight likely exacerbated by her dyspnea.  She received Xanax with improvement.  Still requiring oxygen this morning.  Reports feeling somewhat improved.  Still dyspneic.  Patient went to discuss her CODE STATUS this morning.  She feels confident that she would not want to be intubated.  She would like to continue with CPR.  She will maintain full code at this time.  Her husband was on the phone for this discussion.   Objective: Vitals:   12/21/22 0436 12/21/22 0642 12/21/22 0801 12/21/22 0839  BP: (!) 113/50  130/63   Pulse: 65  (!) 119 81  Resp: 18  18 (!) 24  Temp: 98.5 F (36.9 C)  98 F (36.7 C)   TempSrc: Oral  Oral   SpO2: 100% 92% 98% 96%  Weight:      Height:        Intake/Output Summary (Last 24 hours) at 12/21/2022 0942 Last data filed at 12/20/2022 1708 Gross per 24 hour  Intake 720 ml  Output --  Net 720 ml   Filed Weights   12/19/22 0921  Weight: 48.5 kg    Examination: General exam: Appears calm and comfortable, NAD Respiratory system: Tachypnea, shallow respirations, better aeration today, end expiratory wheezing appreciated bilaterally. 2L O2 via [place].  Cardiovascular system: S1 & S2 heard, RRR.  Gastrointestinal system: Abdomen is nondistended, soft and nontender.  Neuro: Alert and oriented. No focal neurological deficits. Extremities: Symmetric, expected ROM Skin: No rashes,  lesions Psychiatry: Demonstrates appropriate judgement and insight. Mood & affect appropriate for situation.   Assessment & Plan:  Principal Problem:   Community acquired pneumonia Active Problems:   COPD exacerbation (HCC)  Community acquired pneumonia - Superimposed on history of COPD. - Flu/COVID/RSV negative. - Continue with Rocephin and azithromycin for now (IV infiltrated halfway through ceftriaxone drip administration on 12/1.  IV is not replaced.  1 g IM was ordered to complete dosing) switch to p.o. tomorrow - Symptomatic support - Continue oxygen, wean as tolerated - Follow-up blood culture, sputum culture, negative to date - Follow CBC and fever curve  COPD exacerbation - Status post IV Solu-Medrol x 1 - Continue with prednisone 40 daily for now, initiate taper as tolerated -- She is on chronic prednisone therapy outpatient - Continue Breo Ellipta daily - Scheduled DuoNebs - Continue to wean oxygen as tolerated - Patient is not on baseline O2 - Incentive spirometry  Hyperkalemia - Suspect hyperkalemia is due to patient's Entresto and spironolactone with the absence of Lasix which she has not been taking recently. - Hold Entresto and spironolactone for now - Lokelma X2 11/30. - Continue daily BMP  Heart failure with reduced EF - Has extensive history of cardiomyopathy, VSD repair as a child, - Last TTE 9/24: EF 40%, anterior septal akinesis, grade 1 diastolic dysfunction, no valvular abnormalities - Patient takes Lasix as needed at home.  Will schedule daily dose for now -  BNP 472 on arrival, patient clinically euvolemic - Noted AICD and pacemaker - Blood pressure and heart rate are low.  Proceed with home doses digoxin, bisoprolol, as blood pressure.pulse tolerates.  Too low this morning for meds  HLD Peripheral arterial disease CAD - Continue home dose statin and aspirin  Anxiety - Continue daily alprazolam at bedtime, home med. -- Has needed PRN xanax  overnight.  Cirrhosis - Incidentally seen on outpatient imaging -- Has been referred to GI for their workup outpatient  Tobacco abuse - Quit earlier this year - Follows with pulmonology for lung cancer screening        DVT prophylaxis: heparin Code Status: Full Family Communication: Husband on the phone for discussion. Disposition: inpatient. She continues to require supplemental O2. Discharge when weaned.    Consultants:  none Antimicrobials:  Anti-infectives (From admission, onward)    Start     Dose/Rate Route Frequency Ordered Stop   12/20/22 1300  cefTRIAXone (ROCEPHIN) 1 g in sodium chloride 0.9 % 100 mL IVPB  Status:  Discontinued        1 g 200 mL/hr over 30 Minutes Intravenous Every 24 hours 12/19/22 2006 12/19/22 2007   12/20/22 1300  azithromycin (ZITHROMAX) tablet 500 mg        500 mg Oral Daily 12/19/22 2006 12/24/22 0959   12/20/22 1300  cefTRIAXone (ROCEPHIN) 2 g in sodium chloride 0.9 % 100 mL IVPB        2 g 200 mL/hr over 30 Minutes Intravenous Every 24 hours 12/19/22 2007     12/19/22 1315  cefTRIAXone (ROCEPHIN) 1 g in sodium chloride 0.9 % 100 mL IVPB        1 g 200 mL/hr over 30 Minutes Intravenous  Once 12/19/22 1305 12/19/22 1455   12/19/22 1315  azithromycin (ZITHROMAX) 500 mg in sodium chloride 0.9 % 250 mL IVPB        500 mg 250 mL/hr over 60 Minutes Intravenous  Once 12/19/22 1305 12/19/22 1709       Data Reviewed: I have personally reviewed following labs and imaging studies CBC: Recent Labs  Lab 12/19/22 1000 12/19/22 1010 12/20/22 0358 12/21/22 0608  WBC 8.3  --  4.8 9.2  NEUTROABS 6.4  --   --  7.0  HGB 12.7 12.6 12.5 11.7*  HCT 38.8 37.0 38.9 36.8  MCV 94.9  --  96.3 94.6  PLT 267  --  246 257   Basic Metabolic Panel: Recent Labs  Lab 12/19/22 1000 12/19/22 1010 12/20/22 0358 12/20/22 0933 12/21/22 0608  NA 137 138 139 136 138  K 4.4 4.3 5.6* 5.2* 5.1  CL 102  --  106 101 101  CO2 26  --  26 25 26   GLUCOSE 132*   --  164* 166* 97  BUN 23  --  21 23 30*  CREATININE 1.19*  --  1.04* 1.09* 0.98  CALCIUM 9.0  --  9.0 9.1 9.0  MG  --   --  2.4  --  2.1  PHOS  --   --  4.6  --  4.0   GFR: Estimated Creatinine Clearance: 37.4 mL/min (by C-G formula based on SCr of 0.98 mg/dL). Liver Function Tests: Recent Labs  Lab 12/21/22 0608  AST 17  ALT 14  ALKPHOS 53  BILITOT 0.7  PROT 5.9*  ALBUMIN 3.6   CBG: Recent Labs  Lab 12/20/22 1135  GLUCAP 132*    Recent Results (from the past 240 hour(s))  Resp  panel by RT-PCR (RSV, Flu A&B, Covid) Anterior Nasal Swab     Status: None   Collection Time: 12/19/22  9:59 AM   Specimen: Anterior Nasal Swab  Result Value Ref Range Status   SARS Coronavirus 2 by RT PCR NEGATIVE NEGATIVE Final   Influenza A by PCR NEGATIVE NEGATIVE Final   Influenza B by PCR NEGATIVE NEGATIVE Final    Comment: (NOTE) The Xpert Xpress SARS-CoV-2/FLU/RSV plus assay is intended as an aid in the diagnosis of influenza from Nasopharyngeal swab specimens and should not be used as a sole basis for treatment. Nasal washings and aspirates are unacceptable for Xpert Xpress SARS-CoV-2/FLU/RSV testing.  Fact Sheet for Patients: BloggerCourse.com  Fact Sheet for Healthcare Providers: SeriousBroker.it  This test is not yet approved or cleared by the [place] FDA and has been authorized for detection and/or diagnosis of SARS-CoV-2 by FDA under an Emergency Use Authorization (EUA). This EUA will remain in effect (meaning this test can be used) for the duration of the COVID-19 declaration under Section 564(b)(1) of the Act, 21 U.S.C. section 360bbb-3(b)(1), unless the authorization is terminated or revoked.     Resp Syncytial Virus by PCR NEGATIVE NEGATIVE Final    Comment: (NOTE) Fact Sheet for Patients: BloggerCourse.com  Fact Sheet for Healthcare  Providers: SeriousBroker.it  This test is not yet approved or cleared by the [place] FDA and has been authorized for detection and/or diagnosis of SARS-CoV-2 by FDA under an Emergency Use Authorization (EUA). This EUA will remain in effect (meaning this test can be used) for the duration of the COVID-19 declaration under Section 564(b)(1) of the Act, 21 U.S.C. section 360bbb-3(b)(1), unless the authorization is terminated or revoked.  Performed at [facility], [address] [place], [place] [address]   Culture, blood (routine x 2)     Status: None (Preliminary result)   Collection Time: 12/19/22  2:10 PM   Specimen: BLOOD  Result Value Ref Range Status   Specimen Description BLOOD LEFT ANTECUBITAL  Final   Special Requests   Final    BOTTLES DRAWN AEROBIC AND ANAEROBIC Blood Culture adequate volume   Culture   Final    NO GROWTH 2 DAYS Performed at [facility], [address] [place], [place] [address]    Report Status PENDING  Incomplete  Culture, blood (routine x 2)     Status: None (Preliminary result)   Collection Time: 12/19/22  4:48 PM   Specimen: BLOOD  Result Value Ref Range Status   Specimen Description BLOOD BLOOD RIGHT HAND  Final   Special Requests   Final    BOTTLES DRAWN AEROBIC AND ANAEROBIC Blood Culture adequate volume   Culture   Final    NO GROWTH 2 DAYS Performed at [facility], [address], [place], [place] [address]    Report Status PENDING  Incomplete  Expectorated Sputum Assessment w Gram Stain, Rflx to Resp Cult     Status: None   Collection Time: 12/19/22  8:06 PM   Specimen: Expectorated Sputum  Result Value Ref Range Status   Specimen Description EXPECTORATED SPUTUM  Final   Special Requests NONE  Final   Sputum evaluation   Final    THIS SPECIMEN IS ACCEPTABLE FOR SPUTUM CULTURE Performed at [facility], [address] [place], [place] [address]    Report Status 12/20/2022  FINAL  Final  Culture, Respiratory w Gram Stain     Status: None (Preliminary result)  Collection Time: 12/19/22  8:06 PM  Result Value Ref Range Status   Specimen Description EXPECTORATED SPUTUM  Final   Special Requests NONE Reflexed from [id number]  Final   Gram Stain   Final    RARE WBC PRESENT, PREDOMINANTLY PMN FEW GRAM POSITIVE COCCI Performed at [facility], [address], [place], [place] [address]    Culture PENDING  Incomplete   Report Status PENDING  Incomplete     Radiology Studies: CT Chest Wo Contrast  Result Date: 12/19/2022 CLINICAL DATA:  Pneumonia, complication suspected, xray done. Shortness of breath. EXAM: CT CHEST WITHOUT CONTRAST TECHNIQUE: Multidetector CT imaging of the chest was performed following the standard protocol without IV contrast. RADIATION DOSE REDUCTION: This exam was performed according to the departmental dose-optimization program which includes automated exposure control, adjustment of the mA and/or kV according to patient size and/or use of iterative reconstruction technique. COMPARISON:  CT scan chest from 10/27/2022 FINDINGS: Cardiovascular: Normal cardiac size. No pericardial effusion. No aortic aneurysm. There are coronary artery calcifications, in keeping with coronary artery disease. There are also moderate peripheral atherosclerotic vascular calcifications of thoracic aorta and its major branches. Mediastinum/Nodes: Visualized thyroid gland appears grossly unremarkable. No solid / cystic mediastinal masses. The esophagus is nondistended precluding optimal assessment. No axillary, mediastinal or hilar lymphadenopathy by size criteria. Lungs/Pleura: The central tracheo-bronchial tree is patent. There are occlusive and nonocclusive filling defects in the bilateral lower lobe bronchial tree, likely due to mucus/secretions or aspiration There are patchy areas of linear, plate-like atelectasis and/or scarring throughout bilateral lungs. Mild upper  lobe predominant centrilobular emphysema noted. There is new, heterogeneous opacity in the right upper lobe, abutting the minor fissure (, series 6, image 45), with associated ground-glass opacities in the right upper lobe, concerning for focal pneumonitis. No mass or consolidation. No pleural effusion or pneumothorax. No suspicious lung nodules. Upper Abdomen: Surgically absent gallbladder. Remaining visualized upper abdominal viscera within normal limits. Musculoskeletal: . A dual lead cardiac pacemaker / defibrillator is noted with its battery pack overlying the right chest wall and the leads terminating in the right atrium and apex of right ventricle. Visualized soft tissues of the chest wall are otherwise grossly unremarkable. No suspicious osseous lesions. There are mild multilevel degenerative changes in the visualized spine. IMPRESSION: *There is a new, heterogeneous opacity in the right upper lobe abutting the minor fissure with associated ground-glass opacities in the right upper lobe, concerning for focal pneumonitis follow-up to clearing is recommended. *Multiple other nonacute observations, as described above. Aortic Atherosclerosis (ICD10-I70.0) and Emphysema (ICD10-J43.9). Electronically Signed   By: Jules Schick M.D.   On: 12/19/2022 12:54   DG Chest Port 1 View  Result Date: 12/19/2022 CLINICAL DATA:  Shortness of breath.  History of COPD. EXAM: PORTABLE CHEST 1 VIEW COMPARISON:  Chest radiographs 04/24/2022 and CT 10/27/2022 FINDINGS: A right subclavian approach ICD remains in place. The cardiomediastinal silhouette is unchanged normal heart size. Sternal wires are again noted. The lungs remain hyperinflated. Asymmetric interstitial type opacities in the right upper lobe are new from the prior radiographs. No sizable pleural effusion or pneumothorax is identified. No acute osseous abnormality is seen. IMPRESSION: New mild right upper lobe opacity which may reflect early pneumonia.  Electronically Signed   By: Sebastian Ache M.D.   On: 12/19/2022 10:16    Scheduled Meds:  ALPRAZolam  0.5 mg Oral QHS   aspirin  81 mg Oral Daily   azithromycin  500 mg Oral Daily  bisoprolol  2.5 mg Oral Daily   digoxin  0.0625 mg Oral Daily   enoxaparin (LOVENOX) injection  30 mg Subcutaneous Q24H   fluticasone furoate-vilanterol  1 puff Inhalation Daily   furosemide  40 mg Oral Daily   ipratropium-albuterol  3 mL Nebulization Q6H   predniSONE  40 mg Oral Q breakfast   rosuvastatin  10 mg Oral Daily   Continuous Infusions:  cefTRIAXone (ROCEPHIN)  IV Stopped (12/20/22 1310)     LOS: 0 days    Time spent:   Debarah Crape, DO [facility]  To contact the attending physician between 7A-7P please use Epic Chat. To contact the covering physician during after hours 7P-7A, please review Amion.   12/21/2022, 9:42 AM

## 2022-12-21 NOTE — Plan of Care (Signed)
  Problem: Education: Goal: Knowledge of General Education information will improve Description: Including pain rating scale, medication(s)/side effects and non-pharmacologic comfort measures Outcome: Not Progressing   

## 2022-12-21 NOTE — Progress Notes (Addendum)
Patient went to bathroom and had a BM. Patient had ambulated to bathroom w/o O2, Patient called RN stating that she was very short of breath. RN went to assess patient, patient was 83% on RA. Placed patient back on O2 and admin PRN breathing treatment. O2 sats improved to 93% on 3L. Discussed w/ patient that someone should be close to patient when patient ambulates to bathroom d/t how short of breath patient gets. Patient declines, patient declines bed alarm despite education. Discussed w/ patient tha she needs to keep O2 on when she ambulates for patients safety. Patient verbalized understanding.

## 2022-12-21 NOTE — Plan of Care (Signed)
  Problem: Education: Goal: Knowledge of General Education information will improve Description Including pain rating scale, medication(s)/side effects and non-pharmacologic comfort measures Outcome: Progressing   

## 2022-12-22 DIAGNOSIS — J189 Pneumonia, unspecified organism: Secondary | ICD-10-CM | POA: Diagnosis not present

## 2022-12-22 LAB — COMPREHENSIVE METABOLIC PANEL
ALT: 16 U/L (ref 0–44)
AST: 21 U/L (ref 15–41)
Albumin: 3.9 g/dL (ref 3.5–5.0)
Alkaline Phosphatase: 56 U/L (ref 38–126)
Anion gap: 12 (ref 5–15)
BUN: 30 mg/dL — ABNORMAL HIGH (ref 8–23)
CO2: 28 mmol/L (ref 22–32)
Calcium: 9.3 mg/dL (ref 8.9–10.3)
Chloride: 100 mmol/L (ref 98–111)
Creatinine, Ser: 1.17 mg/dL — ABNORMAL HIGH (ref 0.44–1.00)
GFR, Estimated: 49 mL/min — ABNORMAL LOW (ref >60)
Glucose, Bld: 90 mg/dL (ref 70–99)
Potassium: 4.2 mmol/L (ref 3.5–5.1)
Sodium: 140 mmol/L (ref 135–145)
Total Bilirubin: 0.7 mg/dL (ref <1.2)
Total Protein: 6.6 g/dL (ref 6.5–8.1)

## 2022-12-22 LAB — CBC WITH DIFFERENTIAL/PLATELET
Abs Immature Granulocytes: 0.04 K/uL (ref 0.00–0.07)
Basophils Absolute: 0 K/uL (ref 0.0–0.1)
Basophils Relative: 0 %
Eosinophils Absolute: 0 K/uL (ref 0.0–0.5)
Eosinophils Relative: 0 %
HCT: 39.2 % (ref 36.0–46.0)
Hemoglobin: 12.5 g/dL (ref 12.0–15.0)
Immature Granulocytes: 0 %
Lymphocytes Relative: 17 %
Lymphs Abs: 1.5 K/uL (ref 0.7–4.0)
MCH: 30 pg (ref 26.0–34.0)
MCHC: 31.9 g/dL (ref 30.0–36.0)
MCV: 94.2 fL (ref 80.0–100.0)
Monocytes Absolute: 1 K/uL (ref 0.1–1.0)
Monocytes Relative: 11 %
Neutro Abs: 6.4 K/uL (ref 1.7–7.7)
Neutrophils Relative %: 72 %
Platelets: 284 K/uL (ref 150–400)
RBC: 4.16 MIL/uL (ref 3.87–5.11)
RDW: 13.1 % (ref 11.5–15.5)
WBC: 8.9 K/uL (ref 4.0–10.5)
nRBC: 0 % (ref 0.0–0.2)

## 2022-12-22 LAB — CULTURE, RESPIRATORY W GRAM STAIN: Culture: NORMAL

## 2022-12-22 LAB — PHOSPHORUS: Phosphorus: 3.7 mg/dL (ref 2.5–4.6)

## 2022-12-22 LAB — MAGNESIUM: Magnesium: 2.1 mg/dL (ref 1.7–2.4)

## 2022-12-22 MED ORDER — FLUTICASONE FUROATE-VILANTEROL 200-25 MCG/ACT IN AEPB: 1.0000 | INHALATION_SPRAY | Freq: Every day | RESPIRATORY_TRACT | 0 refills | Status: DC

## 2022-12-22 MED ORDER — FUROSEMIDE 20 MG PO TABS: 40.0000 mg | ORAL_TABLET | Freq: Every day | ORAL | 1 refills | Status: DC

## 2022-12-22 MED ORDER — AMOXICILLIN-POT CLAVULANATE 875-125 MG PO TABS: 1.0000 | ORAL_TABLET | Freq: Two times a day (BID) | ORAL | 0 refills | Status: AC

## 2022-12-22 MED ORDER — ALPRAZOLAM 0.5 MG PO TABS
0.5000 mg | ORAL_TABLET | Freq: Two times a day (BID) | ORAL | Status: DC | PRN
  Administered 2022-12-22: 0.5 mg via ORAL
  Filled 2022-12-22: qty 1

## 2022-12-22 MED ORDER — PREDNISONE 20 MG PO TABS: ORAL_TABLET | ORAL | 0 refills | Status: DC

## 2022-12-22 NOTE — Progress Notes (Signed)
   12/22/22 1341  Mobility  Activity Ambulated independently in hallway  Level of Assistance Standby assist, set-up cues, supervision of patient - no hands on  Assistive Device Other (Comment) (Handrails)  Distance Ambulated (ft) 600 ft  Activity Response Tolerated fair  Mobility Referral Yes  $Mobility charge 1 Mobility  Mobility Specialist Start Time (ACUTE ONLY) 1311  Mobility Specialist Stop Time (ACUTE ONLY) 1341  Mobility Specialist Time Calculation (min) (ACUTE ONLY) 30 min   Mobility Specialist: Progress Note  Pt agreeable to mobility session - received in bed. Required SB using handrails. Pt with SOB when talking and ambulating. Returned to bed with all needs met - call bell within reach. Husband present.   Nurse requested Mobility Specialist to perform oxygen saturation test with pt which includes removing pt from oxygen both at rest and while ambulating.  Below are the results from that testing.     Patient Saturations on Room Air at Rest = spO2 89%  Patient Saturations on Room Air while Ambulating = sp02 86% .   Patient Saturations on 1 Liters of oxygen while Ambulating = sp02 92%  At end of testing pt left in room on 1 Liters of oxygen.  Reported results to nurse.   Barnie Mort, BS Mobility Specialist Please contact via SecureChat or Rehab office at [phone].

## 2022-12-22 NOTE — Progress Notes (Signed)
Discharge instructions reviewed with pt. Copy of instructions given to pt. Pt informed her scripts were sent to her pharmacy for pick up. O2 tanks delivered to pt's room to take home and will be taken out with her upon discharge.  Pt will be d/c'd via wheelchair with belongings, with her husband that is waiting at the main entrance at this time.          Will be escorted by staff on her O2 tank.   Kimberly Hochberg,RN SWOT

## 2022-12-22 NOTE — TOC Initial Note (Signed)
Transition of Care [facility]) - Initial/Assessment Note    Patient Details  Name: Kimberly [name] MRN: [id number] Date of Birth: June 22, 1945  [facility] CM/SW Contact:    Harriet Masson, RN Phone Number: 12/22/2022, 3:16 PM  Clinical Narrative:                  Spoke to patient regarding transition needs.  Patient lives with husband and has a nebulizer machine.  Orders for home 02. Patient is agreeable to use in house provider [employer] for home 02 needs.  Notified Mitch with [employer] of home 02 order.   Address, Phone number and PCP verified.  TOC will continue to follow for needs.  Expected Discharge Plan: Home/Self Care Barriers to Discharge: Continued Medical Work up   Patient Goals and CMS Choice Patient states their goals for this hospitalization and ongoing recovery are:: return home CMS Medicare.gov Compare Post Acute Care list provided to:: Patient Choice offered to / list presented to : Patient      Expected Discharge Plan and Services     Post Acute Care Choice: Durable Medical Equipment Living arrangements for the past 2 months: Single Family Home                 DME Arranged: Oxygen DME Agency: [employer] Date DME Agency Contacted: 12/22/22 Time DME Agency Contacted: 1423              Prior Living Arrangements/Services Living arrangements for the past 2 months: Single Family Home Lives with:: Spouse Patient language and need for interpreter reviewed:: Yes Do you feel safe going back to the place where you live?: Yes      Need for Family Participation in Patient Care: Yes (Comment) Care giver support system in place?: Yes (comment) Current home services: DME (nebulizer) Criminal Activity/Legal Involvement Pertinent to Current Situation/Hospitalization: No - Comment as needed  Activities of Daily Living   ADL Screening (condition at time of admission) Independently performs ADLs?: Yes (appropriate for developmental age) Is the patient deaf  or have difficulty hearing?: No Does the patient have difficulty seeing, even when wearing glasses/contacts?: No Does the patient have difficulty concentrating, remembering, or making decisions?: No  Permission Sought/Granted                  Emotional Assessment Appearance:: Appears stated age Attitude/Demeanor/Rapport: Gracious Affect (typically observed): Accepting Orientation: : Oriented to Self, Oriented to Place, Oriented to  Time, Oriented to Situation Alcohol / Substance Use: Not Applicable Psych Involvement: No (comment)  Admission diagnosis:  Community acquired pneumonia [J18.9] COPD with acute exacerbation (HCC) [J44.1] Patient Active Problem List   Diagnosis Date Noted   COPD with acute exacerbation (HCC) 12/21/2022   Community acquired pneumonia 12/19/2022   Shingles outbreak 03/06/2022   Fever 10/31/2021   COPD (chronic obstructive pulmonary disease) (HCC) 10/31/2021   Bradycardia 05/29/2021   Acute hypoxemic respiratory failure (HCC) 05/18/2021   Respiratory infection 05/18/2021   Hepatic cirrhosis (HCC) 05/18/2021   Vitamin D deficiency 06/12/2020   Post-COVID-19 condition 06/11/2020   B12 deficiency 06/11/2020   Urinary frequency 06/11/2020   Weight loss 06/11/2020   ICD (implantable cardioverter-defibrillator) in place 02/24/2019   Hyperglycemia 12/28/2018   Left rotator cuff tear arthropathy 11/15/2018   AC joint arthropathy 11/15/2018   Orthostatic syncope    Abscess of right leg 09/24/2016   Left leg cellulitis 08/26/2016   Chronic systolic CHF (congestive heart failure) (HCC) 08/26/2016   Osteoporosis 08/19/2016  Degenerative arthritis of right knee 08/18/2016   Easy bruising 07/24/2016   Right knee pain 07/24/2016   Varicose veins of both lower extremities 07/24/2016   Laceration of right lower leg 07/24/2016   Multifocal pneumonia 05/06/2016   Constipation 04/10/2016   Allergic rhinitis 03/22/2016   Depression 01/03/2016   Acute sinus  infection 06/28/2014   SVT (supraventricular tachycardia) (HCC) 06/14/2014   Syncope and collapse 01/23/2014   VT (ventricular tachycardia) reported on Holter Monitor 01/23/2014   Hearing loss, right 06/29/2013   COPD, severe (HCC) 09/13/2012   Dilated cardiomyopathy (HCC) 02/11/2012   Volume overload 02/11/2012   Migraine 02/26/2011   Chronic neck pain 02/26/2011   Cough 02/26/2011   Encounter for well adult exam with abnormal findings 02/22/2011   Anxiety 02/22/2011   GERD (gastroesophageal reflux disease) 02/22/2011   Pericarditis 02/22/2011   Thoracic outlet syndrome 02/22/2011   HTN (hypertension) 02/22/2011   Fatigue 05/31/2009   CHEST PAIN UNSPECIFIED 05/31/2009   Dyslipidemia 11/13/2008   Former cigarette smoker 11/13/2008   PVD 11/13/2008   COPD exacerbation (HCC) 11/13/2008   VSD- s/p repair x 2 11/13/2008   PCP:  Corwin Levins, MD Pharmacy:   [facility] - [place], [place] - [address] [place] [address] Phone: [phone] Fax: [phone]  [facility] - [place], [place] - [address] [place] [address] Phone: [phone] Fax: [phone]  [id number] - [place], [place] - [address] [place] [address] Phone: [phone] Fax: [phone]     Social Determinants of Health (SDOH) Social History: SDOH Screenings   Food Insecurity: No Food Insecurity (12/19/2022)  Housing: Low Risk  (12/19/2022)  Transportation Needs: No Transportation Needs (12/19/2022)  Utilities: Not At Risk (12/19/2022)  Alcohol Screen: Low Risk  (12/02/2021)  Depression (PHQ2-9): Low Risk  (07/30/2022)  Financial Resource Strain: Low Risk  (12/02/2021)  Physical Activity: Sufficiently Active (12/02/2021)  Social Connections: Socially Integrated (12/02/2021)  Stress: No Stress Concern Present (12/02/2021)  Tobacco Use: Medium Risk (12/19/2022)   SDOH Interventions:     Readmission Risk  Interventions     No data to display

## 2022-12-22 NOTE — Discharge Summary (Signed)
Physician Discharge Summary   Patient: Kimberly [name] MRN: [id number] DOB: 04-24-1945  Admit date:     12/19/2022  Discharge date: 12/22/22  Discharge Physician: Debarah Crape   PCP: Corwin Levins, MD   Recommendations at discharge:   You are being sent home with an additional 4 days of antibiotics to complete your antibiotic course.  You are also being sent home with a steroid taper to continue to treat your COPD.  You are also being sent home on a higher dose of Lasix, 40 mg now. Please note your blood pressure was too low during this admission to tolerate your Entresto and spironolactone.  We recommend you do not take these medications until you follow-up with your primary care physician or cardiologist to discuss your ongoing blood pressure and heart failure management.  You were provided oxygen today at discharge.  Please routinely check your oxygen levels to be sure that your saturation is between 88 and 91%.  If your oxygen saturation is falling below 88, please be sure to wear your oxygen at 2 L.  If you are finding that you are using your oxygen but your oxygen saturation will not reach 88%, please present to the ER for evaluation.  Please make an appointment to follow-up with your pulmonologist, cardiologist, and primary care physician this week to update them on the care plan changes  Discharge Diagnoses: Principal Problem:   Community acquired pneumonia Active Problems:   COPD exacerbation (HCC)   COPD with acute exacerbation (HCC)  Resolved Problems:   * No resolved hospital problems. [facility] Course: Patient is a 77 year old female with COPD, anxiety, CHF, hyperlipidemia, hypertension, GERD, CAD, PAD, who presented to the ED with dyspnea and productive cough.  Chest x-ray on arrival concerning for right upper lung opacity.  CT was consistent with focal pneumonitis.  Patient was admitted for treatment of community-acquired pneumonia and COPD exacerbation.  Started on IV  antibiotics, steroids, and nebulizer treatments.  Had gradual improvement in her breathing but was unable to wean entirely to room air.  We had extensive discussions regarding her multiple comorbidities including severe emphysema, cardiomyopathy, AICD and pacemaker.  Ultimately we decided to discharge home with oxygen.  Patient will continue to attempt O2 weaning outpatient with her pulmonologist with whom she is previously well-established. However, given the severity of her emphysema she may be chronically requiring oxygen therapy. Additionally, patient's BNP was mildly elevated on arrival and she had significantly elevated potassium.  Patient reports she had not been taking her Lasix outpatient in favor of taking Entresto and spironolactone only.  Blood pressure was low to normotensive for the majority of this admission and she could not tolerate reinitiation of Entresto and spironolactone.  We have requested that she discontinue these medications at discharge and follow-up with her cardiologist to discuss further blood pressure management.  We have resumed her Lasix at a higher dose as she did have improvement with her breathing and edema with greater diuresis.   Community acquired pneumonia - Superimposed on history of COPD. - Flu/COVID/RSV negative. - Abx - Symptomatic support - Continue oxygen, wean as tolerated - Follow-up blood culture, sputum culture, negative to date  COPD exacerbation - Status post IV Solu-Medrol x 1 - Continue with prednisone 40 daily for now, initiate taper -- She is on chronic prednisone therapy outpatient - Continue Breo Ellipta daily - Scheduled DuoNebs - Continue to wean oxygen as tolerated - Incentive spirometry -- Pulm follow up   Hyperkalemia -  Suspect hyperkalemia is due to patient's Entresto and spironolactone with the absence of Lasix which she has not been taking recently. - Hold Entresto and spironolactone for now, BP cannot tolerate. Thompson Caul X2  11/30. - Continue daily BMP   Heart failure with reduced EF - Has extensive history of cardiomyopathy, VSD repair as a child, - Last TTE 9/24: EF 40%, anterior septal akinesis, grade 1 diastolic dysfunction, no valvular abnormalities - Patient takes Lasix as needed at home.  Will schedule daily dose for now - BNP 472 on arrival - Noted AICD and pacemaker - Blood pressure and heart rate are low.  Proceed with home doses digoxin, bisoprolol, as blood pressure/pulse tolerates.     HLD Peripheral arterial disease  CAD - Continue home dose statin and aspirin   Anxiety - Continue daily alprazolam at bedtime, home med. -- Has needed PRN xanax overnight.   Cirrhosis - Incidentally seen on outpatient imaging -- Has been referred to GI for their workup outpatient   Tobacco abuse - Quit earlier this year - Follows with pulmonology for lung cancer screening        Consultants: none Procedures performed: none  Disposition: Home Diet recommendation:  Discharge Diet Orders (From admission, onward)     Start     Ordered   12/22/22 0000  Diet - low sodium heart healthy        12/22/22 1633           Cardiac and Carb modified diet DISCHARGE MEDICATION: Allergies as of 12/22/2022       Reactions   Mupirocin Shortness Of Breath, Other (See Comments)   Burning, pain, swelling and sob   Codeine Nausea Only        Medication List     STOP taking these medications    Entresto 24-26 MG Generic drug: sacubitril-valsartan   spironolactone 25 MG tablet Commonly known as: ALDACTONE   tiZANidine 2 MG tablet Commonly known as: ZANAFLEX       TAKE these medications    albuterol (2.5 MG/3ML) 0.083% nebulizer solution Commonly known as: PROVENTIL Take 3 mLs (2.5 mg total) by nebulization every 6 (six) hours as needed for wheezing or shortness of breath. TAKE 3 MLS (1 VIAL) BY NEBULIZATION EVERY 6 HOURS AS NEEDED FOR WHEEZING OR SHORTNESS OF BREATH   albuterol 108 (90  Base) MCG/ACT inhaler Commonly known as: VENTOLIN HFA Inhale 2 puffs into the lungs every 6 (six) hours as needed for wheezing or shortness of breath.   ALPRAZolam 0.5 MG tablet Commonly known as: XANAX 1 tab by mouth twice per day as needed   amoxicillin-clavulanate 875-125 MG tablet Commonly known as: AUGMENTIN Take 1 tablet by mouth 2 (two) times daily for 4 days.   aspirin 81 MG tablet Take 1 tablet (81 mg total) by mouth daily.   bisoprolol 5 MG tablet Commonly known as: ZEBETA TAKE 1/2 TABLET (2.5 MG TOTAL) BY MOUTH DAILY What changed: See the new instructions.   digoxin 0.125 MG tablet Commonly known as: LANOXIN TAKE 1/2 TABLET (62.5 MCG TOTAL) BY MOUTH DAILY What changed: See the new instructions.   fluticasone furoate-vilanterol 200-25 MCG/ACT Aepb Commonly known as: BREO ELLIPTA Inhale 1 puff into the lungs daily. Start taking on: December 23, 2022   furosemide 20 MG tablet Commonly known as: LASIX Take 2 tablets (40 mg total) by mouth daily. What changed:  how much to take when to take this reasons to take this   predniSONE 20 MG tablet  Commonly known as: DELTASONE Take two tabs daily for 3 days, then one tab daily for 3 days, then half tab for 2 days and stop. Start taking on: December 23, 2022 What changed:  medication strength how much to take how to take this when to take this additional instructions   rosuvastatin 10 MG tablet Commonly known as: CRESTOR TAKE 1 TABLET BY MOUTH DAILY               Durable Medical Equipment  (From admission, onward)           Start     Ordered   12/22/22 1227  For home use only DME oxygen  Once       Question Answer Comment  Length of Need 6 Months   Mode or (Route) Nasal cannula   Liters per Minute 2   Frequency Continuous (stationary and portable oxygen unit needed)   Oxygen conserving device Yes   Oxygen delivery system Gas      12/22/22 1226            Follow-up Information     [facility]. Schedule an appointment as soon as possible for a visit.   Why: Schedule apt for portable oxygen concentrator test. Contact information: [address] [place] [address] [phone]                Discharge Exam: Filed Weights   12/19/22 0921  Weight: 48.5 kg   General exam: Appears calm and comfortable, NAD Respiratory system: no work of breathing, poor aeration bilaterally, no wheezing Cardiovascular system: S1 & S2 heard, RRR.  Gastrointestinal system: Abdomen is nondistended, soft and nontender.  Neuro: Alert and oriented. No focal neurological deficits. Extremities: Symmetric, expected ROM Skin: No rashes, lesions Psychiatry: Demonstrates appropriate judgement and insight. Mood & affect appropriate for situation.   Condition at discharge: stable  The results of significant diagnostics from this hospitalization (including imaging, microbiology, ancillary and laboratory) are listed below for reference.   Imaging Studies: CUP PACEART REMOTE DEVICE CHECK  Result Date: 12/19/2022 Scheduled remote reviewed. Normal device function.  Within the monitoring period, HF diagnostics have been normal.  2 AHR detections lasting less than 5 seconds. Next remote 91 days. - CS, CVRS  CT Chest Wo Contrast  Result Date: 12/19/2022 CLINICAL DATA:  Pneumonia, complication suspected, xray done. Shortness of breath. EXAM: CT CHEST WITHOUT CONTRAST TECHNIQUE: Multidetector CT imaging of the chest was performed following the standard protocol without IV contrast. RADIATION DOSE REDUCTION: This exam was performed according to the departmental dose-optimization program which includes automated exposure control, adjustment of the mA and/or kV according to patient size and/or use of iterative reconstruction technique. COMPARISON:  CT scan chest from 10/27/2022 FINDINGS: Cardiovascular: Normal cardiac size. No pericardial effusion. No aortic aneurysm. There are coronary  artery calcifications, in keeping with coronary artery disease. There are also moderate peripheral atherosclerotic vascular calcifications of thoracic aorta and its major branches. Mediastinum/Nodes: Visualized thyroid gland appears grossly unremarkable. No solid / cystic mediastinal masses. The esophagus is nondistended precluding optimal assessment. No axillary, mediastinal or hilar lymphadenopathy by size criteria. Lungs/Pleura: The central tracheo-bronchial tree is patent. There are occlusive and nonocclusive filling defects in the bilateral lower lobe bronchial tree, likely due to mucus/secretions or aspiration There are patchy areas of linear, plate-like atelectasis and/or scarring throughout bilateral lungs. Mild upper lobe predominant centrilobular emphysema noted. There is new, heterogeneous opacity in the right upper lobe, abutting the minor fissure (, series  6, image 45), with associated ground-glass opacities in the right upper lobe, concerning for focal pneumonitis. No mass or consolidation. No pleural effusion or pneumothorax. No suspicious lung nodules. Upper Abdomen: Surgically absent gallbladder. Remaining visualized upper abdominal viscera within normal limits. Musculoskeletal: . A dual lead cardiac pacemaker / defibrillator is noted with its battery pack overlying the right chest wall and the leads terminating in the right atrium and apex of right ventricle. Visualized soft tissues of the chest wall are otherwise grossly unremarkable. No suspicious osseous lesions. There are mild multilevel degenerative changes in the visualized spine. IMPRESSION: *There is a new, heterogeneous opacity in the right upper lobe abutting the minor fissure with associated ground-glass opacities in the right upper lobe, concerning for focal pneumonitis follow-up to clearing is recommended. *Multiple other nonacute observations, as described above. Aortic Atherosclerosis (ICD10-I70.0) and Emphysema (ICD10-J43.9).  Electronically Signed   By: Jules Schick M.D.   On: 12/19/2022 12:54   DG Chest Port 1 View  Result Date: 12/19/2022 CLINICAL DATA:  Shortness of breath.  History of COPD. EXAM: PORTABLE CHEST 1 VIEW COMPARISON:  Chest radiographs 04/24/2022 and CT 10/27/2022 FINDINGS: A right subclavian approach ICD remains in place. The cardiomediastinal silhouette is unchanged normal heart size. Sternal wires are again noted. The lungs remain hyperinflated. Asymmetric interstitial type opacities in the right upper lobe are new from the prior radiographs. No sizable pleural effusion or pneumothorax is identified. No acute osseous abnormality is seen. IMPRESSION: New mild right upper lobe opacity which may reflect early pneumonia. Electronically Signed   By: Sebastian Ache M.D.   On: 12/19/2022 10:16    Microbiology: Results for orders placed or performed during the hospital encounter of 12/19/22  Resp panel by RT-PCR (RSV, Flu A&B, Covid) Anterior Nasal Swab     Status: None   Collection Time: 12/19/22  9:59 AM   Specimen: Anterior Nasal Swab  Result Value Ref Range Status   SARS Coronavirus 2 by RT PCR NEGATIVE NEGATIVE Final   Influenza A by PCR NEGATIVE NEGATIVE Final   Influenza B by PCR NEGATIVE NEGATIVE Final    Comment: (NOTE) The Xpert Xpress SARS-CoV-2/FLU/RSV plus assay is intended as an aid in the diagnosis of influenza from Nasopharyngeal swab specimens and should not be used as a sole basis for treatment. Nasal washings and aspirates are unacceptable for Xpert Xpress SARS-CoV-2/FLU/RSV testing.  Fact Sheet for Patients: BloggerCourse.com  Fact Sheet for Healthcare Providers: SeriousBroker.it  This test is not yet approved or cleared by the [place] FDA and has been authorized for detection and/or diagnosis of SARS-CoV-2 by FDA under an Emergency Use Authorization (EUA). This EUA will remain in effect (meaning this test can be used)  for the duration of the COVID-19 declaration under Section 564(b)(1) of the Act, 21 U.S.C. section 360bbb-3(b)(1), unless the authorization is terminated or revoked.     Resp Syncytial Virus by PCR NEGATIVE NEGATIVE Final    Comment: (NOTE) Fact Sheet for Patients: BloggerCourse.com  Fact Sheet for Healthcare Providers: SeriousBroker.it  This test is not yet approved or cleared by the [place] FDA and has been authorized for detection and/or diagnosis of SARS-CoV-2 by FDA under an Emergency Use Authorization (EUA). This EUA will remain in effect (meaning this test can be used) for the duration of the COVID-19 declaration under Section 564(b)(1) of the Act, 21 U.S.C. section 360bbb-3(b)(1), unless the authorization is terminated or revoked.  Performed at [facility], [address], [place], [place] [address]  Culture, blood (routine x 2)     Status: None (Preliminary result)   Collection Time: 12/19/22  2:10 PM   Specimen: BLOOD  Result Value Ref Range Status   Specimen Description BLOOD LEFT ANTECUBITAL  Final   Special Requests   Final    BOTTLES DRAWN AEROBIC AND ANAEROBIC Blood Culture adequate volume   Culture   Final    NO GROWTH 3 DAYS Performed at [facility], [address] [place], [place] [address]    Report Status PENDING  Incomplete  Culture, blood (routine x 2)     Status: None (Preliminary result)   Collection Time: 12/19/22  4:48 PM   Specimen: BLOOD  Result Value Ref Range Status   Specimen Description BLOOD BLOOD RIGHT HAND  Final   Special Requests   Final    BOTTLES DRAWN AEROBIC AND ANAEROBIC Blood Culture adequate volume   Culture   Final    NO GROWTH 3 DAYS Performed at [facility], [address], [place], [place] [address]    Report Status PENDING  Incomplete  Expectorated Sputum Assessment w Gram Stain, Rflx to Resp Cult     Status: None   Collection Time: 12/19/22   8:06 PM   Specimen: Expectorated Sputum  Result Value Ref Range Status   Specimen Description EXPECTORATED SPUTUM  Final   Special Requests NONE  Final   Sputum evaluation   Final    THIS SPECIMEN IS ACCEPTABLE FOR SPUTUM CULTURE Performed at [facility], [address], [place], [place] [address]    Report Status 12/20/2022 FINAL  Final  Culture, Respiratory w Gram Stain     Status: None   Collection Time: 12/19/22  8:06 PM  Result Value Ref Range Status   Specimen Description EXPECTORATED SPUTUM  Final   Special Requests NONE Reflexed from [id number]  Final   Gram Stain   Final    RARE WBC PRESENT, PREDOMINANTLY PMN FEW GRAM POSITIVE COCCI    Culture   Final    FEW Normal respiratory flora-no Staph aureus or Pseudomonas seen Performed at [facility], [address], [place], [place] [address]    Report Status 12/22/2022 FINAL  Final    Labs: CBC: Recent Labs  Lab 12/19/22 1000 12/19/22 1010 12/20/22 0358 12/21/22 0608 12/22/22 0718  WBC 8.3  --  4.8 9.2 8.9  NEUTROABS 6.4  --   --  7.0 6.4  HGB 12.7 12.6 12.5 11.7* 12.5  HCT 38.8 37.0 38.9 36.8 39.2  MCV 94.9  --  96.3 94.6 94.2  PLT 267  --  246 257 284   Basic Metabolic Panel: Recent Labs  Lab 12/19/22 1000 12/19/22 1010 12/20/22 0358 12/20/22 0933 12/21/22 0608 12/22/22 0718  NA 137 138 139 136 138 140  K 4.4 4.3 5.6* 5.2* 5.1 4.2  CL 102  --  106 101 101 100  CO2 26  --  26 25 26 28   GLUCOSE 132*  --  164* 166* 97 90  BUN 23  --  21 23 30* 30*  CREATININE 1.19*  --  1.04* 1.09* 0.98 1.17*  CALCIUM 9.0  --  9.0 9.1 9.0 9.3  MG  --   --  2.4  --  2.1 2.1  PHOS  --   --  4.6  --  4.0 3.7   Liver Function Tests: Recent Labs  Lab 12/21/22 0608 12/22/22 0718  AST 17 21  ALT 14 16  ALKPHOS 53 56  BILITOT 0.7 0.7  PROT 5.9* 6.6  ALBUMIN 3.6 3.9   CBG: Recent Labs  Lab 12/20/22 1135  GLUCAP 132*    Discharge time spent: greater than 30 minutes.  Signed: Debarah Crape,  DO [facility] 12/22/2022

## 2022-12-22 NOTE — Progress Notes (Signed)
Heart Failure Navigator Progress Note  Assessed for Heart & Vascular TOC clinic readiness.  Patient does not meet criteria due to Advanced Heart Failure Team patient of Dr. McLean.   Navigator will sign off at this time.   Mahreen Schewe, BSN, RN Heart Failure Nurse Navigator Secure Chat Only   

## 2022-12-22 NOTE — Plan of Care (Signed)
  Problem: Education: Goal: Knowledge of General Education information will improve Description Including pain rating scale, medication(s)/side effects and non-pharmacologic comfort measures Outcome: Progressing   Problem: Health Behavior/Discharge Planning: Goal: Ability to manage health-related needs will improve Outcome: Progressing   

## 2022-12-22 NOTE — Progress Notes (Signed)
Patient Saturations on Room Air at Rest = spO2 89%   Patient Saturations on Room Air while Ambulating = sp02 86% .    Patient Saturations on 1 Liters of oxygen while Ambulating = sp02 92%   At end of testing pt left in room on 1 Liters of oxygen.

## 2022-12-22 NOTE — Hospital Course (Signed)
Patient is a 77 year old female with COPD, anxiety, CHF, hyperlipidemia, hypertension, GERD, CAD, PAD, who presented to the ED with dyspnea and productive cough.  Chest x-ray on arrival concerning for right upper lung opacity.  CT was consistent with focal pneumonitis.  Patient was admitted for treatment of community-acquired pneumonia and COPD exacerbation.  Started on IV antibiotics, steroids, and nebulizer treatments.  Had gradual improvement in her breathing but was unable to wean entirely to room air.  We had extensive discussions regarding her multiple comorbidities including severe emphysema, cardiomyopathy, AICD and pacemaker.  Ultimately we decided to discharge home with oxygen.  Patient will continue to attempt O2 weaning outpatient with her pulmonologist with whom she is previously well-established. However, given the severity of her emphysema she may be chronically requiring oxygen therapy. Additionally, patient's BNP was mildly elevated on arrival and she had significantly elevated potassium.  Patient reports she had not been taking her Lasix outpatient in favor of taking Entresto and spironolactone only.  Blood pressure was low to normotensive for the majority of this admission and she could not tolerate reinitiation of Entresto and spironolactone.  We have requested that she discontinue these medications at discharge and follow-up with her cardiologist to discuss further blood pressure management.  We have resumed her Lasix at a higher dose as she did have improvement with her breathing and edema with greater diuresis.   Community acquired pneumonia - Superimposed on history of COPD. - Flu/COVID/RSV negative. - Abx - Symptomatic support - Continue oxygen, wean as tolerated - Follow-up blood culture, sputum culture, negative to date  COPD exacerbation - Status post IV Solu-Medrol x 1 - Continue with prednisone 40 daily for now, initiate taper -- She is on chronic prednisone therapy  outpatient - Continue Breo Ellipta daily - Scheduled DuoNebs - Continue to wean oxygen as tolerated - Incentive spirometry -- Pulm follow up   Hyperkalemia - Suspect hyperkalemia is due to patient's Entresto and spironolactone with the absence of Lasix which she has not been taking recently. - Hold Entresto and spironolactone for now, BP cannot tolerate. Thompson Caul X2 11/30. - Continue daily BMP   Heart failure with reduced EF - Has extensive history of cardiomyopathy, VSD repair as a child, - Last TTE 9/24: EF 40%, anterior septal akinesis, grade 1 diastolic dysfunction, no valvular abnormalities - Patient takes Lasix as needed at home.  Will schedule daily dose for now - BNP 472 on arrival - Noted AICD and pacemaker - Blood pressure and heart rate are low.  Proceed with home doses digoxin, bisoprolol, as blood pressure/pulse tolerates.     HLD Peripheral arterial disease  CAD - Continue home dose statin and aspirin   Anxiety - Continue daily alprazolam at bedtime, home med. -- Has needed PRN xanax overnight.   Cirrhosis - Incidentally seen on outpatient imaging -- Has been referred to GI for their workup outpatient   Tobacco abuse - Quit earlier this year - Follows with pulmonology for lung cancer screening

## 2022-12-24 ENCOUNTER — Telehealth (HOSPITAL_COMMUNITY): Payer: Self-pay | Admitting: Cardiology

## 2022-12-24 LAB — CULTURE, BLOOD (ROUTINE X 2)
Culture: NO GROWTH
Culture: NO GROWTH
Special Requests: ADEQUATE
Special Requests: ADEQUATE

## 2022-12-24 NOTE — Telephone Encounter (Signed)
Pt hospitalized for copd/pneumonia Discharged 12/2 with instructions to stop entresto, spironolactone, and tizanidine  Pt would like to know if its safe to remain off meds  Scheduled for follow up 12/23    Please advise

## 2022-12-25 NOTE — Telephone Encounter (Signed)
Pt aware.

## 2022-12-31 ENCOUNTER — Ambulatory Visit: Payer: Medicare HMO

## 2022-12-31 ENCOUNTER — Encounter: Payer: Self-pay | Admitting: Adult Health

## 2022-12-31 ENCOUNTER — Ambulatory Visit: Payer: Medicare HMO | Admitting: Adult Health

## 2022-12-31 VITALS — BP 130/60 | HR 61 | Temp 98.5°F | Ht 61.0 in | Wt 107.2 lb

## 2022-12-31 DIAGNOSIS — F17210 Nicotine dependence, cigarettes, uncomplicated: Secondary | ICD-10-CM

## 2022-12-31 DIAGNOSIS — J9611 Chronic respiratory failure with hypoxia: Secondary | ICD-10-CM

## 2022-12-31 DIAGNOSIS — I509 Heart failure, unspecified: Secondary | ICD-10-CM

## 2022-12-31 DIAGNOSIS — J439 Emphysema, unspecified: Secondary | ICD-10-CM | POA: Diagnosis not present

## 2022-12-31 DIAGNOSIS — J189 Pneumonia, unspecified organism: Secondary | ICD-10-CM

## 2022-12-31 DIAGNOSIS — J441 Chronic obstructive pulmonary disease with (acute) exacerbation: Secondary | ICD-10-CM | POA: Diagnosis not present

## 2022-12-31 DIAGNOSIS — I5022 Chronic systolic (congestive) heart failure: Secondary | ICD-10-CM

## 2022-12-31 DIAGNOSIS — F17211 Nicotine dependence, cigarettes, in remission: Secondary | ICD-10-CM

## 2022-12-31 MED ORDER — PREDNISONE 10 MG PO TABS: 10.0000 mg | ORAL_TABLET | Freq: Every day | ORAL | 0 refills | Status: DC

## 2022-12-31 MED ORDER — PREDNISONE 5 MG PO TABS: 5.0000 mg | ORAL_TABLET | Freq: Every day | ORAL | 1 refills | Status: DC

## 2022-12-31 MED ORDER — BREZTRI AEROSPHERE 160-9-4.8 MCG/ACT IN AERO: 2.0000 | INHALATION_SPRAY | Freq: Two times a day (BID) | RESPIRATORY_TRACT | 11 refills | Status: DC

## 2022-12-31 NOTE — Progress Notes (Signed)
@Patient  ID: Kimberly [name], female    DOB: 1946/01/14, 77 y.o.   MRN: [id number]  Chief Complaint  Patient presents with   Hospitalization Follow-up   Discussed the use of AI scribe software for clinical note transcription with the patient, who gave verbal consent to proceed  Referring provider: Corwin Levins, MD  HPI: 77 year old female former smoker (quit 12/2022 )followed for severe COPD Medical history significant for peripheral vascular disease, cardiomyopathy with ICD, SVT, CHF, VSD status postrepair x 2, cirrhosis Hospitalization for community-acquired pneumonia December 2024-started on oxygen at discharge at 2 L Participates in LDCT Chest screening program.   TEST/EVENTS :  01/12/2017 PFTs: FVC 55, FEV1 37, ratio 54, TLC 93, DLCOunc 47. 05/26/2019 cardiopulmonary stress test: Submaximal, moderate HF limitation but more significant pulmonary limitation 09/26/2020 echocardiogram: EF 35-40 percent.  No definite residual VSD.  G1 DD.  RV function mildly reduced.  Normal PASP.  Trivial MR. 05/12/2021 CTA chest: No evidence of PE.  Mild cardiomegaly.  Atherosclerosis present.  There are scattered tree-in-bud opacities within the right lower lobe, likely infection.  Moderate centrilobular emphysema is present.  Nodular hepatic contour, consistent with cirrhosis. 07/29/2021 CXR 2 view: Both lungs are clear.  Lungs are hyperinflated with mild biapical pleural thickening which is unchanged.  No acute process. 04/24/2022 CXR: hyperinflation. No acute process.  08/18/2022 PFT: FVC 59, FEV1 35, ratio 46, TLC 163, DLCO 51. Severe obstructive disease without reversibility. Moderate diffusing defect   12/31/2022 Follow up : COPD, O2 RF , post hospital follow-up, pneumonia Patient returns for a posthospital follow-up.  Patient was admitted earlier this month for acute COPD exacerbation and right sided community-acquired pneumonia.  She was did with IV antibiotics, steroids and nebulized bronchodilators.   She did require oxygen at discharge at 2 L.  Prior to admission had stopped taking her Lasix.    Has been recommended follow-up with cardiology in the outpatient setting.  Was recommended to continue on Lasix.  Chest x-ray and CT chest showed right upper lobe opacity.  Has completed course antibiotics and steroids.  Since discharge is feeling better but she woke up with left lung pain and productive cough with green sputum, 2 days ago. Which was less green at the time of the visit but still associated with pain. She had run out of her prescribed medication -Stiolto and had self-discontinued prednisone, which she regretted.  She did have some mild hypotension during hospitalization, Entresto and spironolactone was held during hospitalization. The patient also reported changes to her cardiac medications due to blood pressure concerns while in the hospital. She was advised to discontinue Entresto and spironolactone, and she was taking Lasix 40mg . She had an upcoming appointment with her cardiologist.  The patient was on Stiolto inhaler but was struggling to afford the copay. She had previously been on [place], which she got through patient assistance program for free.   She remains on Oxygen 2l/m .   Has quit smoking since discharge .      Allergies  Allergen Reactions   Mupirocin Shortness Of Breath and Other (See Comments)    Burning, pain, swelling and sob   Codeine Nausea Only    Immunization History  Administered Date(s) Administered   Fluad Quad(high Dose 65+) 11/27/2021   Fluad Trivalent(High Dose 65+) 11/05/2022   Influenza Split 10/15/2020   Influenza, High Dose Seasonal PF 09/30/2018   Influenza,inj,Quad PF,6+ Mos 10/17/2015   Influenza,inj,quad, With Preservative 11/17/2016   Influenza-Unspecified 09/20/2013, 10/13/2017, 09/30/2018, 10/13/2019, 10/13/2019  PFIZER(Purple Top)SARS-COV-2 Vaccination 03/13/2019, 04/05/2019   Pneumococcal Conjugate-13 02/20/2010, 11/23/2012    Pneumococcal Polysaccharide-23 10/27/2017   Tdap 02/20/2009    Past Medical History:  Diagnosis Date   AICD (automatic cardioverter/defibrillator) present 03/17/2017   Anxiety    Atrial tachycardia (HCC)    Basal cell carcinoma    R ant thigh, L pretibia, both txted at [facility]   Bursitis of shoulder, right, adhesive    Cancer (HCC)    CHF (congestive heart failure) (HCC)    Chronic bronchitis (HCC)    "1-2 times/yr" (01/23/2014)   COPD (chronic obstructive pulmonary disease) (HCC)    CVD (cerebrovascular disease)    Dyslipidemia    Dysrhythmia    Frequency of urination    GERD (gastroesophageal reflux disease)    Heart murmur    History of stomach ulcers    HTN (hypertension) 02/22/2011   Migraines    "stopped many years ago" (06/14/2014)   Osteoporosis 08/19/2016   Pericarditis    Pneumonia "10 times" (06/14/2014)   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)    Silent myocardial infarction (HCC) "late 1990's"   Stress incontinence    "was suppose to have been tacked up years ago but I didn't do it"   Syncope, near    Associated with atrial tachycardia-event recorder 1/16   Thoracic outlet syndrome    VSD (ventricular septal defect)     Tobacco History: Social History   Tobacco Use  Smoking Status Former   Current packs/day: 0.50   Average packs/day: 0.5 packs/day for 35.0 years (17.5 ttl pk-yrs)   Types: Cigarettes  Smokeless Tobacco Never  Tobacco Comments   3-4 cigarettes daily. Trying to quit. HS   Counseling given: Not Answered Tobacco comments: 3-4 cigarettes daily. Trying to quit. HS   Outpatient Medications Prior to Visit  Medication Sig Dispense Refill   albuterol (PROVENTIL) (2.5 MG/3ML) 0.083% nebulizer solution Take 3 mLs (2.5 mg total) by nebulization every 6 (six) hours as needed for wheezing or shortness of breath. TAKE 3 MLS (1 VIAL) BY NEBULIZATION EVERY 6 HOURS AS NEEDED FOR WHEEZING OR SHORTNESS OF  BREATH 90 mL 5   albuterol (VENTOLIN HFA) 108 (90 Base) MCG/ACT inhaler Inhale 2 puffs into the lungs every 6 (six) hours as needed for wheezing or shortness of breath. 8.5 g 5   ALPRAZolam (XANAX) 0.5 MG tablet 1 tab by mouth twice per day as needed 60 tablet 5   aspirin 81 MG tablet Take 1 tablet (81 mg total) by mouth daily. 100 tablet 99   bisoprolol (ZEBETA) 5 MG tablet TAKE 1/2 TABLET (2.5 MG TOTAL) BY MOUTH DAILY (Patient taking differently: Take 2.5 mg by mouth daily.) 45 tablet 3   digoxin (LANOXIN) 0.125 MG tablet TAKE 1/2 TABLET (62.5 MCG TOTAL) BY MOUTH DAILY (Patient taking differently: Take 0.0625 mg by mouth daily.) 45 tablet 3   fluticasone furoate-vilanterol (BREO ELLIPTA) 200-25 MCG/ACT AEPB Inhale 1 puff into the lungs daily. 30 each 0   furosemide (LASIX) 20 MG tablet Take 2 tablets (40 mg total) by mouth daily. 90 tablet 1   rosuvastatin (CRESTOR) 10 MG tablet TAKE 1 TABLET BY MOUTH DAILY 90 tablet 2   predniSONE (DELTASONE) 20 MG tablet Take two tabs daily for 3 days, then one tab daily for 3 days, then half tab for 2 days and stop. (Patient not taking: Reported on 12/31/2022) 10 tablet 0   No facility-administered medications prior to visit.  Review of Systems:   Constitutional:   No  weight loss, night sweats,  Fevers, chills,+fatigue, or  lassitude.  HEENT:   No headaches,  Difficulty swallowing,  Tooth/dental problems, or  Sore throat,                No sneezing, itching, ear ache, nasal congestion, post nasal drip,   CV:  No chest pain,  Orthopnea, PND, swelling in lower extremities, anasarca, dizziness, palpitations, syncope.   GI  No heartburn, indigestion, abdominal pain, nausea, vomiting, diarrhea, change in bowel habits, loss of appetite, bloody stools.   Resp:  No chest wall deformity  Skin: no rash or lesions.  GU: no dysuria, change in color of urine, no urgency or frequency.  No flank pain, no hematuria   MS:  No joint pain or swelling.  No  decreased range of motion.  No back pain.    Physical Exam  BP 130/60 (BP Location: Left Arm, Patient Position: Sitting, Cuff Size: Normal)   Pulse 61   Temp 98.5 F (36.9 C) (Oral)   Ht 5\' 1"  (1.549 m)   Wt 107 lb 3.2 oz (48.6 kg)   SpO2 98%   BMI 20.26 kg/m   GEN: A/Ox3; pleasant , NAD, thin and frail , O2    HEENT:  [place]/AT,  EACs-clear, TMs-wnl, NOSE-clear, THROAT-clear, no lesions, no postnasal drip or exudate noted.   NECK:  Supple w/ fair ROM; no JVD; normal carotid impulses w/o bruits; no thyromegaly or nodules palpated; no lymphadenopathy.    RESP diminished at the bases  no accessory muscle use, no dullness to percussion  CARD:  RRR, no m/r/g, no peripheral edema, pulses intact, no cyanosis or clubbing.  GI:   Soft & nt; nml bowel sounds; no organomegaly or masses detected.   Musco: Warm bil, no deformities or joint swelling noted.   Neuro: alert, no focal deficits noted.    Skin: Warm, no lesions or rashes    Lab Results:  CBC   BMET    Imaging: CUP PACEART REMOTE DEVICE CHECK  Result Date: 12/19/2022 Scheduled remote reviewed. Normal device function.  Within the monitoring period, HF diagnostics have been normal.  2 AHR detections lasting less than 5 seconds. Next remote 91 days. - CS, CVRS  CT Chest Wo Contrast  Result Date: 12/19/2022 CLINICAL DATA:  Pneumonia, complication suspected, xray done. Shortness of breath. EXAM: CT CHEST WITHOUT CONTRAST TECHNIQUE: Multidetector CT imaging of the chest was performed following the standard protocol without IV contrast. RADIATION DOSE REDUCTION: This exam was performed according to the departmental dose-optimization program which includes automated exposure control, adjustment of the mA and/or kV according to patient size and/or use of iterative reconstruction technique. COMPARISON:  CT scan chest from 10/27/2022 FINDINGS: Cardiovascular: Normal cardiac size. No pericardial effusion. No aortic aneurysm. There  are coronary artery calcifications, in keeping with coronary artery disease. There are also moderate peripheral atherosclerotic vascular calcifications of thoracic aorta and its major branches. Mediastinum/Nodes: Visualized thyroid gland appears grossly unremarkable. No solid / cystic mediastinal masses. The esophagus is nondistended precluding optimal assessment. No axillary, mediastinal or hilar lymphadenopathy by size criteria. Lungs/Pleura: The central tracheo-bronchial tree is patent. There are occlusive and nonocclusive filling defects in the bilateral lower lobe bronchial tree, likely due to mucus/secretions or aspiration There are patchy areas of linear, plate-like atelectasis and/or scarring throughout bilateral lungs. Mild upper lobe predominant centrilobular emphysema noted. There is new, heterogeneous opacity in the right upper lobe, abutting the minor  fissure (, series 6, image 45), with associated ground-glass opacities in the right upper lobe, concerning for focal pneumonitis. No mass or consolidation. No pleural effusion or pneumothorax. No suspicious lung nodules. Upper Abdomen: Surgically absent gallbladder. Remaining visualized upper abdominal viscera within normal limits. Musculoskeletal: . A dual lead cardiac pacemaker / defibrillator is noted with its battery pack overlying the right chest wall and the leads terminating in the right atrium and apex of right ventricle. Visualized soft tissues of the chest wall are otherwise grossly unremarkable. No suspicious osseous lesions. There are mild multilevel degenerative changes in the visualized spine. IMPRESSION: *There is a new, heterogeneous opacity in the right upper lobe abutting the minor fissure with associated ground-glass opacities in the right upper lobe, concerning for focal pneumonitis follow-up to clearing is recommended. *Multiple other nonacute observations, as described above. Aortic Atherosclerosis (ICD10-I70.0) and Emphysema  (ICD10-J43.9). Electronically Signed   By: Jules Schick M.D.   On: 12/19/2022 12:54   DG Chest Port 1 View  Result Date: 12/19/2022 CLINICAL DATA:  Shortness of breath.  History of COPD. EXAM: PORTABLE CHEST 1 VIEW COMPARISON:  Chest radiographs 04/24/2022 and CT 10/27/2022 FINDINGS: A right subclavian approach ICD remains in place. The cardiomediastinal silhouette is unchanged normal heart size. Sternal wires are again noted. The lungs remain hyperinflated. Asymmetric interstitial type opacities in the right upper lobe are new from the prior radiographs. No sizable pleural effusion or pneumothorax is identified. No acute osseous abnormality is seen. IMPRESSION: New mild right upper lobe opacity which may reflect early pneumonia. Electronically Signed   By: Sebastian Ache M.D.   On: 12/19/2022 10:16    Administration History     None          Latest Ref Rng & Units 08/18/2022    7:59 AM 01/12/2017    9:38 AM  PFT Results  FVC-Pre L 1.36  1.46   FVC-Predicted Pre % 55  55   FVC-Post L 1.46  1.64   FVC-Predicted Post % 59  61   Pre FEV1/FVC % % 48  52   Post FEV1/FCV % % 46  54   FEV1-Pre L 0.65  0.76   FEV1-Predicted Pre % 35  37   FEV1-Post L 0.67  0.88   DLCO uncorrected ml/min/mmHg 8.86  9.68   DLCO UNC% % 51  47   DLCO corrected ml/min/mmHg 8.81    DLCO COR %Predicted % 51    DLVA Predicted % 61  78   TLC L 7.54  4.30   TLC % Predicted % 163  93   RV % Predicted % 280  134     No results found for: "NITRICOXIDE"      Assessment & Plan:  Assessment and Plan    COPD Exacerbation Severe COPD has led to a recent exacerbation, causing acute respiratory failure with hypoxemia and community-acquired pneumonia. She is experiencing left lung pain and green sputum production, suggesting possible ongoing infection or inflammation. In the setting of Severe 3 COPD. We discussed the restarting daily prednisone to manage inflammation and prevent exacerbations, as well as the  necessity of inhalers to maintain lung function. We addressed the financial burden of medications and the availability of patient assistance programs for [place]. We will order a chest x-ray today, restart prednisone at 10 mg daily for 2 weeks, then reduce to 5 mg daily, continue oxygen therapy, provide Breztri samples and apply for patient assistance, recommend Robitussin DM for mucus management, encourage the use  of an albuterol nebulizer as needed,    Community-Acquired Pneumonia She was recently hospitalized for right-sided pneumonia with infiltrates on CT chest and chest x-ray, treated with IV antibiotics, steroids, and nebulized bronchodilators. Persistent symptoms may indicate incomplete resolution, and we discussed the potential need for further antibiotics if symptoms persist. We will order a chest x-ray today to assess the current status.  Patient  Emphysema This chronic condition contributes to her reduced lung function (30-35% capacity). Recent smoking cessation is beneficial, and we discussed the importance of staying active and using prescribed medications to manage symptoms. We encourage continued smoking cessation.  Heart Failure Heart failure management included recent medication adjustments, discontinuing Entresto and spironolactone due to blood pressure concerns, and currently taking Lasix 40 mg. We emphasized the importance of following up with cardiologist Dr. Shirlee Latch to reassess the medication regimen. She will continue Lasix 40 mg and follow up with cardiologist Dr. Shirlee Latch later this month.  General Health Maintenance She received a flu shot and we discussed the importance of social interaction and precautions during the holidays, advising on hand hygiene and avoiding close contact with sick individuals. We encourage hand washing, avoiding close contact with sick individuals, and recommend wearing a mask in crowded places.  Follow-up She will follow up with a pulmonologist in  4-6 weeks and with cardiologist Dr. Shirlee Latch later this month.         I spent  42  minutes dedicated to the care of this patient on the date of this encounter to include pre-visit review of records, face-to-face time with the patient discussing conditions above, post visit ordering of testing, clinical documentation with the electronic health record, making appropriate referrals as documented, and communicating necessary findings to members of the patients care team.   Rubye Oaks, NP 12/31/2022

## 2022-12-31 NOTE — Patient Instructions (Addendum)
Chest xray today .  Stop Stiolto.  Begin Breztri 2 puffs Twice daily, rinse after use.  Breztri patient assistance paperwork.  Albuterol inhaler /neb As needed   Continue on Oxygen 2l/m with activity and At bedtime  .  Great job on not smoking  Robitussin DM Twice daily  As needed   Restart Prednisone 10mg  daily for 2 weeks and then 5 mg  Follow up with 4-6 weeks with Dr. Sherene [name] and As needed   Please contact office for sooner follow up if symptoms do not improve or worsen or seek emergency care

## 2023-01-02 NOTE — Progress Notes (Signed)
-   Acute on chronic HFrEF

## 2023-01-08 NOTE — Progress Notes (Addendum)
PCP: Dr. Jonny Ruiz Cardiology: Dr. Tenny Craw HF Cardiology: Dr. Shirlee Latch Cardiology/Vascular: Dr Kirke Corin  CC: Heart failure follow up  77 y.o. with history of VSD repair as a child, COPD/active smoking, PAD, and chronic systolic CHF.  Patient has a long history of cardiomyopathy.  She had VSD repaired as a child and imaging has not showed residual VSD.  Cardiac MRI in 11/09 showed EF 36%.  Echo in 12/15 showed EF 40-45%.  Echo in 3/18 showed EF down to 15-20% with moderate RV systolic dysfunction.  RHC/LHC in 10/18 showed nonobstructive coronary but was concerning for low output (CI 1.33).  She had a nonhealing right lower extremity wound.  Peripheral angiogram with bilateral CFA occlusions and underwent DCB/self expanding stent to to right CFA.    CPX in 1/19 showed moderate to severe functional limitation, combination of HF and lung disease, probably more related to the lung disease.  PFTs in 12/18 showed severe COPD.   She had a Environmental manager CRT-D device placed.    7/19 peripheral arterial dopplers showed significant in-stent restenosis right CFA stent.  - Echo in 4/21 showed EF 35-40% with mild AI.  CPX in 5/21 showed severe COPD, mild-moderate HF limitation. 11/21 Cardiolite showed prior septal and apical infarction, no ischemia.   Had COVID April 2022. She has continued to recover.   - Echo in 9/22 showed EF 35-40% with basal-mid septal akinesis and anterior hypokinesis, no evidence for residual VSD, mildly decreased RV systolic function.   - Echo in 11/23 showed EF 40% with basal to mid anteroseptal and inferoseptal akinesis, mild RV dysfunction, PASP 33 mmHg.  - Echo 9/24: EF 40%, GIDD, LV with RWMA, RV normal, trivial MR  Admitted earlier this month with CAP.  Treated and sent home with abx and steroids. Diuretics were increased. Entresto and spiro were held with soft BP, not restarted at discharge. D/c weight 107lbs.   Today she returns for post hospital follow up with her husband.  Overall feeling ok, upset about being on prednisone and being off of some of her HF meds since her admission. Denies palpitations, CP, edema, or PND/Orthopnea. Intt dizziness when she stands too fast. SOB when upset. Appetite too good right now with prednisone use. No fever or chills. Weight at home 106 pounds, has noticed weight gain. Taking all medications. Denies ETOH, tobacco or drug use.   [name] CRT-D device interrogation: HeartLogic 35, no AF, thoracic impedance low. 1 episodes NSVT, 91% BiV pacing (?lower due to PVCs) (Personally reviewed)    ECG (personally reviewed): A-BiV paced, PVC 69 bpm  PMH: 1. VSD: s/p repair at [facility] as child.  From description, sounds like muscular VSD.  2. Atrial tachycardia s/p ablation.  3. Hyperlipidemia 4. COPD: Quit smoking 2024 - PFTs (12/18): Severe obstructive lung disease.  5. Atrial fibrillation: Paroxysmal.   Noted only transiently.   6. PAD:  - Angiogram 11/18 showed totally occluded bilateral CFAs.  Patient had stent to right CFA.  ABIs in 12/18 were normal on right. - ABIs (10/20): ABI 0.8 right, 0.74 left - 12/21 dopplers with R CFA stent in-stenosis.  - ABIs (1/24): normal on right, 0.68 on left with occluded left CFA.  7. Chronic systolic CHF: Nonischemic cardiomyopathy.   - cMRI (11/09): EF 36%, VSD patch in anterior ventricular septum - Echo (12/15): EF 40-45% - Echo (3/18): EF 15-20%, moderate LV dilation, moderate MR, moderate RV dilation with moderately decreased systolic function.  - LHC/RHC (10/18): 3+ MR, EF < 25%, minimal  nonobstructive CAD; PA 41/21, LVEDP 18, CI 1.33.  - CPX (1/19): peak VO2 13.4, VE/VCO2 slope 43, RER 1.07.  Mod-severe functional limitation due to combination of HF and lung disease, probably more related to lung disease.  - [name] CRT-D device.  - Echo (7/19): EF 25-30%, mild LV dilation, mild MR, normal RV size and systolic function.  - Echo (4/21): EF 35-40%, mild AI - CPX (5/21):  severe COPD, mild-moderate HF limitation.  - Cardiolite (11/21): EF 47%, prior septal and apical infarction, no ischemia. - Echo (9/22): EF 35-40% with basal-mid septal akinesis and anterior hypokinesis, no evidence for residual VSD, mildly decreased RV systolic function.  - Echo (11/23): EF 40% with basal to mid anteroseptal and inferoseptal akinesis, mild RV dysfunction, PASP 33 mmHg.  -  Echo 9/24: EF 40%, GIDD, LV with RWMA, RV normal, trivial MR 8. PVCs: Zio patch 9/19 with 6% PVCs.  9. COVID-19 (4/22)  SH: Smoker, lives in [place], married.  FH: Mother with PAD, sister died at birth from congenital heart disease.   ROS: All systems reviewed and negative except as per HPI.   Current Outpatient Medications  Medication Sig Dispense Refill   losartan (COZAAR) 25 MG tablet Take 1 tablet (25 mg total) by mouth at bedtime. 90 tablet 3   spironolactone (ALDACTONE) 25 MG tablet Take 0.5 tablets (12.5 mg total) by mouth at bedtime. 45 tablet 3   albuterol (PROVENTIL) (2.5 MG/3ML) 0.083% nebulizer solution Take 3 mLs (2.5 mg total) by nebulization every 6 (six) hours as needed for wheezing or shortness of breath. TAKE 3 MLS (1 VIAL) BY NEBULIZATION EVERY 6 HOURS AS NEEDED FOR WHEEZING OR SHORTNESS OF BREATH 90 mL 5   albuterol (VENTOLIN HFA) 108 (90 Base) MCG/ACT inhaler Inhale 2 puffs into the lungs every 6 (six) hours as needed for wheezing or shortness of breath. 8.5 g 5   ALPRAZolam (XANAX) 0.5 MG tablet 1 tab by mouth twice per day as needed 60 tablet 5   aspirin 81 MG tablet Take 1 tablet (81 mg total) by mouth daily. 100 tablet 99   bisoprolol (ZEBETA) 5 MG tablet TAKE 1/2 TABLET (2.5 MG TOTAL) BY MOUTH DAILY (Patient taking differently: Take 2.5 mg by mouth daily.) 45 tablet 3   Budeson-Glycopyrrol-Formoterol (BREZTRI AEROSPHERE) 160-9-4.8 MCG/ACT AERO Inhale 2 puffs into the lungs in the morning and at bedtime. 1 each 11   digoxin (LANOXIN) 0.125 MG tablet TAKE 1/2 TABLET (62.5 MCG  TOTAL) BY MOUTH DAILY (Patient taking differently: Take 0.0625 mg by mouth daily.) 45 tablet 3   furosemide (LASIX) 20 MG tablet Take 2 tablets (40 mg total) by mouth every morning AND 1 tablet (20 mg total) every evening. 90 tablet 6   predniSONE (DELTASONE) 10 MG tablet Take 1 tablet (10 mg total) by mouth daily with breakfast. 14 tablet 0   predniSONE (DELTASONE) 5 MG tablet Take 1 tablet (5 mg total) by mouth daily with breakfast. 30 tablet 1   rosuvastatin (CRESTOR) 10 MG tablet TAKE 1 TABLET BY MOUTH DAILY 90 tablet 2   No current facility-administered medications for this encounter.   BP 136/72   Pulse 60   Ht 5\' 1"  (1.549 m)   Wt 52.9 kg (116 lb 9.6 oz)   SpO2 96%   BMI 22.03 kg/m   Wt Readings from Last 3 Encounters:  01/12/23 52.9 kg (116 lb 9.6 oz)  12/31/22 48.6 kg (107 lb 3.2 oz)  12/19/22 48.5 kg (107 lb)  Physical Exam:  General:  well appearing.  No respiratory difficulty. Walked into clinic.  HEENT: normal Neck: supple. JVD ~14 cm. Carotids 2+ bilat; no bruits. No lymphadenopathy or thyromegaly appreciated. Cor: PMI nondisplaced. Regular rate & rhythm. No rubs, gallops or murmurs. Lungs: clear Abdomen: soft, nontender, nondistended. No hepatosplenomegaly. No bruits or masses. Good bowel sounds. Extremities: no cyanosis, clubbing, rash, +2-3 BLE edema  Neuro: alert & oriented x 3, cranial nerves grossly intact. moves all 4 extremities w/o difficulty. Affect pleasant.   Assessment/Plan: 1.  PAD: She had endovascular intervention to occluded right CFA in 11/18, but subsequent arterial showed in-stent restenosis. She has occluded left CFA also that was not intervened on. Minimal claudication.  She has finally quit smoking.  Follows with Dr. Kirke Corin for PV with plan for medical management unless claudication worsens.  - Continue statin.  2. CAD: Nonobstructive on 10/18 cath. Cardiolite in 11/21 with no evidence of ischemia. She has occasional atypical chest pain.  - She  is on ASA 81 and statin.  3. Hyperlipidemia: Continue crestor, good lipids in 7/24  4. COPD: Severe by PFTs on 5/21 CPX. COPD plays a significant role in her dyspnea. She has finally quit smoking.  Follows with pulmonary.  - Started on prednisone for CAP during recent admission. Encouraged to reach out to pulmonary to see if there will be an end date for the prednisone. She previously stopped it in the past because it didn't make her feel good.  5. Chronic systolic CHF: Nonischemic cardiomyopathy.  RHC/LHC in 10/18 with no nonobstructive coronary disease but CI was very low at 1.33.  Her low cardiac output was out of proportion to her symptoms.  [name] CRT-D device.  CPX in 5/21 showed severe COPD but only mild-moderate HF limitation.   Echo with EF stable at 35-40% in 9/22 and EF 40% on 11/23 echo. Echo 9/24: EF 40%, GIDD, LV with RWMA, RV normal, trivial MR.  - Volume overloaded by exam and by Heartlogic (has been up trending since discharged). NYHA class II-IIIa symptoms, probably in large part due to COPD but also from being off meds and prednisone use.     - Increase lasix 40> 40 am/20 pm (previously on PRN)   - Start losartan 25 mg QHS, plan to transition back to Entresto at next visit once diuresed and will hold off today with intt dizziness.  - Continue bisoprolol 2.5 mg daily.  - Restart spironolactone 12.5 mg QHS.  - Continue digoxin, check level today.  - Unable to tolerate [place].   Follow-up in 2-3 weeks with APP to reassess volume and increase spiro/restart Entresto.   Alen Bleacher 01/12/2023

## 2023-01-12 ENCOUNTER — Ambulatory Visit (HOSPITAL_COMMUNITY)
Admission: RE | Admit: 2023-01-12 | Discharge: 2023-01-12 | Disposition: A | Discharge status: Home / Self Care | Payer: Medicare HMO | Source: Ambulatory Visit | Attending: Internal Medicine | Admitting: Internal Medicine

## 2023-01-12 ENCOUNTER — Encounter (HOSPITAL_COMMUNITY): Payer: Self-pay

## 2023-01-12 VITALS — BP 136/72 | HR 60 | Ht 61.0 in | Wt 116.6 lb

## 2023-01-12 DIAGNOSIS — J449 Chronic obstructive pulmonary disease, unspecified: Secondary | ICD-10-CM | POA: Insufficient documentation

## 2023-01-12 DIAGNOSIS — Z7952 Long term (current) use of systemic steroids: Secondary | ICD-10-CM | POA: Insufficient documentation

## 2023-01-12 DIAGNOSIS — I5022 Chronic systolic (congestive) heart failure: Secondary | ICD-10-CM | POA: Diagnosis not present

## 2023-01-12 DIAGNOSIS — Z79899 Other long term (current) drug therapy: Secondary | ICD-10-CM | POA: Diagnosis not present

## 2023-01-12 DIAGNOSIS — Z8774 Personal history of (corrected) congenital malformations of heart and circulatory system: Secondary | ICD-10-CM | POA: Insufficient documentation

## 2023-01-12 DIAGNOSIS — I739 Peripheral vascular disease, unspecified: Secondary | ICD-10-CM | POA: Insufficient documentation

## 2023-01-12 DIAGNOSIS — F17200 Nicotine dependence, unspecified, uncomplicated: Secondary | ICD-10-CM | POA: Insufficient documentation

## 2023-01-12 DIAGNOSIS — Z7982 Long term (current) use of aspirin: Secondary | ICD-10-CM | POA: Diagnosis not present

## 2023-01-12 DIAGNOSIS — I2510 Atherosclerotic heart disease of native coronary artery without angina pectoris: Secondary | ICD-10-CM | POA: Diagnosis not present

## 2023-01-12 DIAGNOSIS — I428 Other cardiomyopathies: Secondary | ICD-10-CM | POA: Diagnosis not present

## 2023-01-12 DIAGNOSIS — R0789 Other chest pain: Secondary | ICD-10-CM | POA: Insufficient documentation

## 2023-01-12 DIAGNOSIS — E785 Hyperlipidemia, unspecified: Secondary | ICD-10-CM | POA: Diagnosis not present

## 2023-01-12 LAB — BASIC METABOLIC PANEL
Anion gap: 9 (ref 5–15)
BUN: 20 mg/dL (ref 8–23)
CO2: 31 mmol/L (ref 22–32)
Calcium: 9.4 mg/dL (ref 8.9–10.3)
Chloride: 99 mmol/L (ref 98–111)
Creatinine, Ser: 1.09 mg/dL — ABNORMAL HIGH (ref 0.44–1.00)
GFR, Estimated: 53 mL/min — ABNORMAL LOW (ref >60)
Glucose, Bld: 120 mg/dL — ABNORMAL HIGH (ref 70–99)
Potassium: 4.7 mmol/L (ref 3.5–5.1)
Sodium: 139 mmol/L (ref 135–145)

## 2023-01-12 LAB — CBC
HCT: 34.8 % — ABNORMAL LOW (ref 36.0–46.0)
Hemoglobin: 11 g/dL — ABNORMAL LOW (ref 12.0–15.0)
MCH: 30.6 pg (ref 26.0–34.0)
MCHC: 31.6 g/dL (ref 30.0–36.0)
MCV: 96.9 fL (ref 80.0–100.0)
Platelets: 202 10*3/uL (ref 150–400)
RBC: 3.59 MIL/uL — ABNORMAL LOW (ref 3.87–5.11)
RDW: 13.8 % (ref 11.5–15.5)
WBC: 7.1 10*3/uL (ref 4.0–10.5)
nRBC: 0 % (ref 0.0–0.2)

## 2023-01-12 LAB — DIGOXIN LEVEL: Digoxin Level: 0.5 ng/mL — ABNORMAL LOW (ref 0.8–2.0)

## 2023-01-12 MED ORDER — FUROSEMIDE 20 MG PO TABS: ORAL_TABLET | ORAL | 6 refills | Status: DC

## 2023-01-12 MED ORDER — LOSARTAN POTASSIUM 25 MG PO TABS
25.0000 mg | ORAL_TABLET | Freq: Every evening | ORAL | 3 refills | Status: DC
Start: 2023-01-12 — End: 2023-02-03

## 2023-01-12 MED ORDER — SPIRONOLACTONE 25 MG PO TABS: 12.5000 mg | ORAL_TABLET | Freq: Every evening | ORAL | 3 refills | Status: DC

## 2023-01-12 NOTE — Patient Instructions (Addendum)
Thank you for coming in today  If you had labs drawn today, any labs that are abnormal the clinic will call you No news is good news  Medications: Start spironolactone 12.5 mg 1/2 tablet nightly Start Losartan 25 mg nightly  Follow up appointments: Your physician recommends that you return for lab work in:  1 week BMET as stated you would go to [facility] to get labs done  Your physician recommends that you schedule a follow-up appointment in:  2-3 weeks in clinic   Do the following things EVERYDAY: Weigh yourself in the morning before breakfast. Write it down and keep it in a log. Take your medicines as prescribed Eat low salt foods--Limit salt (sodium) to 2000 mg per day.  Stay as active as you can everyday Limit all fluids for the day to less than 2 liters   At the [facility], you and your health needs are our priority. As part of our continuing mission to provide you with exceptional heart care, we have created designated Provider Care Teams. These Care Teams include your primary Cardiologist (physician) and Advanced Practice Providers (APPs- Physician Assistants and Nurse Practitioners) who all work together to provide you with the care you need, when you need it.   You may see any of the following providers on your designated Care Team at your next follow up: Dr Arvilla Meres Dr [name] Dr. Marcos Eke, NP Robbie Lis, [place] [facility] [place], [place] Brynda Peon, NP Karle Plumber, PharmD   Please be sure to bring in all your medications bottles to every appointment.    Thank you for choosing  [facility]  If you have any questions or concerns before your next appointment please send [place] a message through [place] or call our office at [phone].    TO LEAVE A MESSAGE FOR THE NURSE SELECT OPTION 2, PLEASE LEAVE A MESSAGE INCLUDING: YOUR NAME DATE OF BIRTH CALL BACK  NUMBER REASON FOR CALL**this is important as we prioritize the call backs  YOU WILL RECEIVE A CALL BACK THE SAME DAY AS LONG AS YOU CALL BEFORE 4:00 PM

## 2023-01-19 ENCOUNTER — Other Ambulatory Visit (HOSPITAL_COMMUNITY): Payer: Self-pay

## 2023-01-19 ENCOUNTER — Other Ambulatory Visit
Admission: RE | Admit: 2023-01-19 | Discharge: 2023-01-19 | Disposition: A | Discharge status: Home / Self Care | Payer: Medicare HMO | Attending: Internal Medicine | Admitting: Internal Medicine

## 2023-01-19 DIAGNOSIS — I5022 Chronic systolic (congestive) heart failure: Secondary | ICD-10-CM | POA: Diagnosis present

## 2023-01-19 LAB — BASIC METABOLIC PANEL
Anion gap: 11 (ref 5–15)
BUN: 21 mg/dL (ref 8–23)
CO2: 31 mmol/L (ref 22–32)
Calcium: 9.5 mg/dL (ref 8.9–10.3)
Chloride: 98 mmol/L (ref 98–111)
Creatinine, Ser: 1.11 mg/dL — ABNORMAL HIGH (ref 0.44–1.00)
GFR, Estimated: 52 mL/min — ABNORMAL LOW (ref >60)
Glucose, Bld: 97 mg/dL (ref 70–99)
Potassium: 4.4 mmol/L (ref 3.5–5.1)
Sodium: 140 mmol/L (ref 135–145)

## 2023-01-20 ENCOUNTER — Encounter: Payer: Self-pay | Admitting: Cardiology

## 2023-01-20 NOTE — Progress Notes (Signed)
 This encounter was created in error - please disregard. I called patient and she informed me right away that she could not do the visit today.  Patient stated that she had just got out of the hospital and still is not feeling good.  She said  that it takes too much for her to talk right now (due to being out of breath) and that she will call office to reschedule in a couple of days. I just rescheduled patient for later in January.

## 2023-01-24 DIAGNOSIS — J441 Chronic obstructive pulmonary disease with (acute) exacerbation: Secondary | ICD-10-CM | POA: Diagnosis not present

## 2023-01-30 ENCOUNTER — Telehealth: Payer: Self-pay | Admitting: Adult Health

## 2023-01-30 MED ORDER — PREDNISONE 10 MG PO TABS: 10.0000 mg | ORAL_TABLET | Freq: Every day | ORAL | 0 refills | Status: DC

## 2023-01-30 MED ORDER — DOXYCYCLINE HYCLATE 100 MG PO TABS: 100.0000 mg | ORAL_TABLET | Freq: Two times a day (BID) | ORAL | 0 refills | Status: DC

## 2023-01-30 NOTE — Telephone Encounter (Signed)
 Spoke with the pt  She is c/o increased cough, head stopped up- light green sputum  She has not had any wheezing or increased SOB  No fever, aches   She is taking 5 mg pred daily, breztri , albuterol  inhaler/nebs  She is asking if she needs to increase pred or add abx  Please advise, thanks!  Allergies  Allergen Reactions   Mupirocin  Shortness Of Breath and Other (See Comments)    Burning, pain, swelling and sob   Codeine Nausea Only

## 2023-01-30 NOTE — Telephone Encounter (Signed)
 PT saw Ms. Parrett 12/11 and was Rx'd pred and Antibx. States she still has a head cold and some greenish phlegm. Her lungs are not hurting and wonders if she should increase her pred from 5 mg to 10 mg and restart another round of antibx if Ms. Parrett feels this is best.  Please call to advise @ [phone]  Medical [facility] in [place]

## 2023-01-30 NOTE — Telephone Encounter (Signed)
 Called and spoke with pt about meds that have been sent into pharmacy. She verbalized understanding nfn

## 2023-01-30 NOTE — Telephone Encounter (Signed)
 Sx c/w COPD Exacerbation :  Recommend doxycycline  100 mg twice daily for 1 week, take with food  Increase prednisone  to 10 mg daily for 1 week then resume 5 mg daily dosing.  If symptoms not improving will need sooner office visit  Please contact office for sooner follow up if symptoms do not improve or worsen or seek emergency care

## 2023-02-03 ENCOUNTER — Other Ambulatory Visit (HOSPITAL_COMMUNITY): Payer: Self-pay

## 2023-02-03 ENCOUNTER — Ambulatory Visit (HOSPITAL_COMMUNITY)
Admission: RE | Admit: 2023-02-03 | Discharge: 2023-02-03 | Disposition: A | Discharge status: Home / Self Care | Payer: Medicare Other | Source: Ambulatory Visit | Attending: Family Medicine | Admitting: Family Medicine

## 2023-02-03 ENCOUNTER — Encounter (HOSPITAL_COMMUNITY): Payer: Self-pay

## 2023-02-03 VITALS — BP 118/60 | HR 93 | Ht 61.0 in | Wt 111.2 lb

## 2023-02-03 DIAGNOSIS — Z87891 Personal history of nicotine dependence: Secondary | ICD-10-CM | POA: Diagnosis not present

## 2023-02-03 DIAGNOSIS — J439 Emphysema, unspecified: Secondary | ICD-10-CM

## 2023-02-03 DIAGNOSIS — E785 Hyperlipidemia, unspecified: Secondary | ICD-10-CM

## 2023-02-03 DIAGNOSIS — Z8616 Personal history of COVID-19: Secondary | ICD-10-CM | POA: Insufficient documentation

## 2023-02-03 DIAGNOSIS — I2510 Atherosclerotic heart disease of native coronary artery without angina pectoris: Secondary | ICD-10-CM

## 2023-02-03 DIAGNOSIS — J189 Pneumonia, unspecified organism: Secondary | ICD-10-CM

## 2023-02-03 DIAGNOSIS — Z8774 Personal history of (corrected) congenital malformations of heart and circulatory system: Secondary | ICD-10-CM | POA: Diagnosis not present

## 2023-02-03 DIAGNOSIS — I739 Peripheral vascular disease, unspecified: Secondary | ICD-10-CM

## 2023-02-03 DIAGNOSIS — I5022 Chronic systolic (congestive) heart failure: Secondary | ICD-10-CM

## 2023-02-03 DIAGNOSIS — Z79899 Other long term (current) drug therapy: Secondary | ICD-10-CM | POA: Insufficient documentation

## 2023-02-03 DIAGNOSIS — Z7952 Long term (current) use of systemic steroids: Secondary | ICD-10-CM | POA: Diagnosis not present

## 2023-02-03 DIAGNOSIS — Z7982 Long term (current) use of aspirin: Secondary | ICD-10-CM | POA: Insufficient documentation

## 2023-02-03 DIAGNOSIS — R0789 Other chest pain: Secondary | ICD-10-CM | POA: Diagnosis not present

## 2023-02-03 DIAGNOSIS — J449 Chronic obstructive pulmonary disease, unspecified: Secondary | ICD-10-CM | POA: Diagnosis not present

## 2023-02-03 DIAGNOSIS — I428 Other cardiomyopathies: Secondary | ICD-10-CM | POA: Diagnosis not present

## 2023-02-03 LAB — BASIC METABOLIC PANEL
Anion gap: 11 (ref 5–15)
BUN: 27 mg/dL — ABNORMAL HIGH (ref 8–23)
CO2: 27 mmol/L (ref 22–32)
Calcium: 9.6 mg/dL (ref 8.9–10.3)
Chloride: 99 mmol/L (ref 98–111)
Creatinine, Ser: 1.4 mg/dL — ABNORMAL HIGH (ref 0.44–1.00)
GFR, Estimated: 39 mL/min — ABNORMAL LOW (ref >60)
Glucose, Bld: 145 mg/dL — ABNORMAL HIGH (ref 70–99)
Potassium: 4.9 mmol/L (ref 3.5–5.1)
Sodium: 137 mmol/L (ref 135–145)

## 2023-02-03 LAB — BRAIN NATRIURETIC PEPTIDE: B Natriuretic Peptide: 312.8 pg/mL — ABNORMAL HIGH (ref 0.0–100.0)

## 2023-02-03 MED ORDER — SPIRONOLACTONE 25 MG PO TABS: 25.0000 mg | ORAL_TABLET | Freq: Every evening | ORAL | 3 refills | Status: DC

## 2023-02-03 MED ORDER — ENTRESTO 24-26 MG PO TABS: 1.0000 | ORAL_TABLET | Freq: Two times a day (BID) | ORAL | Status: DC

## 2023-02-03 MED ORDER — FUROSEMIDE 20 MG PO TABS: 40.0000 mg | ORAL_TABLET | Freq: Two times a day (BID) | ORAL | 3 refills | Status: DC

## 2023-02-03 NOTE — Patient Instructions (Signed)
 Medication Changes:  STOP LOSARTAN    START: ENTRESTO  12/13MG  (1/2) TABLET TWICE A DAY   INCREASE SPIRONOLACTONE  TO 25MG  ONCE DAILY   INCREASE LASIX  (FUROSEMIDE ) TO 40MG  TWICE DAILY   Lab Work:  PLEASE GO TO MEDICAL MALL AT [facility] TO HAVE BLOOD WORK DONE IN 1 WEEK   Follow-Up in: 3 WEEKS AS SCHEDULED   At the [facility], you and your health needs are our priority. We have a designated team specialized in the treatment of Heart Failure. This Care Team includes your primary Heart Failure Specialized Cardiologist (physician), Advanced Practice Providers (APPs- Physician Assistants and Nurse Practitioners), and Pharmacist who all work together to provide you with the care you need, when you need it.   You may see any of the following providers on your designated Care Team at your next follow up:  Dr. Toribio Fuel Dr. Ezra Shuck Dr. Ria Commander Dr. Odis Brownie Greig Mosses, NP Caffie Shed, [place] [facility] [place], [place] Beckey Coe, NP Jordan Lee, NP Tinnie Redman, PharmD   Please be sure to bring in all your medications bottles to every appointment.   Need to Contact Us :  If you have any questions or concerns before your next appointment please send us  a message through [place] or call our office at [phone].    TO LEAVE A MESSAGE FOR THE NURSE SELECT OPTION 2, PLEASE LEAVE A MESSAGE INCLUDING: YOUR NAME DATE OF BIRTH CALL BACK NUMBER REASON FOR CALL**this is important as we prioritize the call backs  YOU WILL RECEIVE A CALL BACK THE SAME DAY AS LONG AS YOU CALL BEFORE 4:00 PM

## 2023-02-03 NOTE — Progress Notes (Signed)
 PCP: Dr. Norleen Cardiology: Dr. Okey Cardiology/Vascular: Dr Darron HF Cardiology: Dr. Rolan  Chief Complaint: Heart Failure Follow-up  HPI: Kimberly [name] is a 78 y.o. female with history of VSD repair as a child, COPD/active smoking, PAD, and chronic systolic CHF.  Patient has a long history of cardiomyopathy.  She had VSD repaired as a child and imaging has not showed residual VSD.  Cardiac MRI in 11/09 showed EF 36%.  Echo in 12/15 showed EF 40-45%.  Echo in 3/18 showed EF down to 15-20% with moderate RV systolic dysfunction.  RHC/LHC in 10/18 showed nonobstructive coronary but was concerning for low output (CI 1.33).  She had a nonhealing right lower extremity wound.  Peripheral angiogram with bilateral CFA occlusions and underwent DCB/self expanding stent to to right CFA.    CPX in 1/19 showed moderate to severe functional limitation, combination of HF and lung disease, probably more related to the lung disease.  PFTs in 12/18 showed severe COPD.   She had a [employer] CRT-D device placed.    7/19 peripheral arterial dopplers showed significant in-stent restenosis right CFA stent.  Echo in 4/21 showed EF 35-40% with mild AI.  CPX in 5/21 showed severe COPD, mild-moderate HF limitation. 11/21 Cardiolite showed prior septal and apical infarction, no ischemia.   Had COVID April 2022. She has continued to recover.   Echo 9/22: EF 35-40% with basal-mid septal akinesis and anterior hypokinesis, no evidence for residual VSD, mildly decreased RV systolic function.    Echo 11/23: EF 40% with basal to mid anteroseptal and inferoseptal akinesis, mild RV dysfunction, PASP 33 mmHg.   Echo 9/24: EF 40%, GIDD, LV with RWMA, RV normal, trivial MR  Admitted 11/24 with CAP.  Treated and sent home with abx and steroids. Diuretics were increased. Entresto  and spiro were held with soft BP, not restarted at discharge. D/c weight 107lbs.   Today she returns for HF follow up with her husband. Overall  feeling fair. She has still been struggling with a cough and LLL pain and intermittent SOB. She reports that she originally had green thick sputum that is now thin and clear. She remains on Prednisone  10 mg and a round of Doxycycline  per pulmonology. She had to use PRN home oxygen  for the first time in a month last night. No CP, palpitations and dizziness improved. Weight down 5 lbs. No fever or chills. Weight at home 106 this am. Taking all medications.  [name] CRT-D device interrogation (personally reviewed): HeartLogic 21, no AF/VT, thoracic impedence improving, 91% BiV pacing   PMH: 1. VSD: s/p repair at [facility] as child.  From description, sounds like muscular VSD.  2. Atrial tachycardia s/p ablation.  3. Hyperlipidemia 4. COPD: Quit smoking 2024 - PFTs (12/18): Severe obstructive lung disease.  5. Atrial fibrillation: Paroxysmal.   Noted only transiently.   6. PAD:  - Angiogram 11/18 showed totally occluded bilateral CFAs.  Patient had stent to right CFA.  ABIs in 12/18 were normal on right. - ABIs (10/20): ABI 0.8 right, 0.74 left - 12/21 dopplers with R CFA stent in-stenosis.  - ABIs (1/24): normal on right, 0.68 on left with occluded left CFA.  7. Chronic systolic CHF: Nonischemic cardiomyopathy.   - cMRI (11/09): EF 36%, VSD patch in anterior ventricular septum - Echo (12/15): EF 40-45% - Echo (3/18): EF 15-20%, moderate LV dilation, moderate MR, moderate RV dilation with moderately decreased systolic function.  - LHC/RHC (10/18): 3+ MR, EF < 25%, minimal nonobstructive CAD; PA  41/21, LVEDP 18, CI 1.33.  - CPX (1/19): peak VO2 13.4, VE/VCO2 slope 43, RER 1.07.  Mod-severe functional limitation due to combination of HF and lung disease, probably more related to lung disease.  - [name] CRT-D device.  - Echo (7/19): EF 25-30%, mild LV dilation, mild MR, normal RV size and systolic function.  - Echo (4/21): EF 35-40%, mild AI - CPX (5/21): severe COPD, mild-moderate  HF limitation.  - Cardiolite (11/21): EF 47%, prior septal and apical infarction, no ischemia. - Echo (9/22): EF 35-40% with basal-mid septal akinesis and anterior hypokinesis, no evidence for residual VSD, mildly decreased RV systolic function.  - Echo (11/23): EF 40% with basal to mid anteroseptal and inferoseptal akinesis, mild RV dysfunction, PASP 33 mmHg.  -  Echo 9/24: EF 40%, GIDD, LV with RWMA, RV normal, trivial MR 8. PVCs: Zio patch 9/19 with 6% PVCs.  9. COVID-19 (4/22)  SH: Smoker, lives in [place], married.  FH: Mother with PAD, sister died at birth from congenital heart disease.   ROS: All systems reviewed and negative except as per HPI.   Current Outpatient Medications  Medication Sig Dispense Refill   albuterol  (PROVENTIL ) (2.5 MG/3ML) 0.083% nebulizer solution Take 3 mLs (2.5 mg total) by nebulization every 6 (six) hours as needed for wheezing or shortness of breath. TAKE 3 MLS (1 VIAL) BY NEBULIZATION EVERY 6 HOURS AS NEEDED FOR WHEEZING OR SHORTNESS OF BREATH 90 mL 5   albuterol  (VENTOLIN  HFA) 108 (90 Base) MCG/ACT inhaler Inhale 2 puffs into the lungs every 6 (six) hours as needed for wheezing or shortness of breath. 8.5 g 5   ALPRAZolam  (XANAX ) 0.5 MG tablet 1 tab by mouth twice per day as needed 60 tablet 5   aspirin  81 MG tablet Take 1 tablet (81 mg total) by mouth daily. 100 tablet 99   bisoprolol  (ZEBETA ) 5 MG tablet TAKE 1/2 TABLET (2.5 MG TOTAL) BY MOUTH DAILY (Patient taking differently: Take 2.5 mg by mouth daily.) 45 tablet 3   Budeson-Glycopyrrol-Formoterol  (BREZTRI  AEROSPHERE) 160-9-4.8 MCG/ACT AERO Inhale 2 puffs into the lungs in the morning and at bedtime. 1 each 11   digoxin  (LANOXIN ) 0.125 MG tablet TAKE 1/2 TABLET (62.5 MCG TOTAL) BY MOUTH DAILY (Patient taking differently: Take 0.0625 mg by mouth daily.) 45 tablet 3   doxycycline  (VIBRA -TABS) 100 MG tablet Take 1 tablet (100 mg total) by mouth 2 (two) times daily. 14 tablet 0   rosuvastatin  (CRESTOR )  10 MG tablet TAKE 1 TABLET BY MOUTH DAILY 90 tablet 2   sacubitril -valsartan  (ENTRESTO ) 24-26 MG Take 1 tablet by mouth 2 (two) times daily. Take (1/2) tablet twice daily     furosemide  (LASIX ) 20 MG tablet Take 2 tablets (40 mg total) by mouth 2 (two) times daily. 120 tablet 3   predniSONE  (DELTASONE ) 10 MG tablet Take 1 tablet (10 mg total) by mouth daily with breakfast. 14 tablet 0   spironolactone  (ALDACTONE ) 25 MG tablet Take 1 tablet (25 mg total) by mouth at bedtime. 30 tablet 3   No current facility-administered medications for this encounter.   BP 118/60   Pulse 93   Ht 5' 1 (1.549 m)   Wt 50.4 kg (111 lb 3.2 oz)   SpO2 93%   BMI 21.01 kg/m    Wt Readings from Last 3 Encounters:  02/03/23 50.4 kg (111 lb 3.2 oz)  01/12/23 52.9 kg (116 lb 9.6 oz)  12/31/22 48.6 kg (107 lb 3.2 oz)   Physical Exam:  General: Well appearing. No distress on RA HEENT: neck supple.   Cardiac: JVP to midneck. S1 and S2 present. No murmurs or rub. Resp: Inspiratory and expiratory wheezes noted in RLL, LLL, and LUL. Rhonchi in RUL. Abdomen: Soft, non-tender, non-distended. + BS. Extremities: Warm and dry. No rash, cyanosis.  No peripheral edema.  Neuro: Alert and oriented x3. Affect pleasant. Moves all extremities without difficulty.  Assessment/Plan: 1. Chronic systolic CHF: Nonischemic cardiomyopathy.  R/LHC in 10/18 with no nonobstructive coronary disease but CI was very low at 1.33.  Her low cardiac output was out of proportion to her symptoms.  [name] CRT-D device.  CPX in 5/21 showed severe COPD but only mild-moderate HF limitation. Echo with EF stable at 35-40% in 9/22 and EF 40% on 11/23 echo. Echo 9/24: EF 40%, GIDD, LV with RWMA, RV normal, trivial MR.  - Volume overloaded by exam and by Heartlogic, although improving since last office visit. NYHA class II-IIIa symptoms, probably in large part due to COPD.  - Increase Lasix  to 40 mg bid. BMET/BNP today.  - Stop Losartan . Start  Entresto  12/13 mg bid. Can likely increase at next visit if BP allows.  - Continue bisoprolol  2.5 mg daily.  - Increase spironolactone  to 25 mg QHS. (Prev had diarrhea with higher dose, will retrial) Repeat labs in 7 days.  - Continue digoxin  0.0625 - Unable to tolerate Farxiga  2.  PAD: She had endovascular intervention to occluded right CFA in 11/18, but subsequent arterial showed in-stent restenosis. She has occluded left CFA also that was not intervened on. Minimal claudication. She has finally quit smoking. Follows with Dr. Darron for PV with plan for medical management unless claudication worsens.  - Continue statin 3. CAD: Nonobstructive on 10/18 cath. Cardiolite in 11/21 with no evidence of ischemia. She has occasional atypical chest pain.  - She is on ASA + statin.  4. Hyperlipidemia: Continue Crestor . Good lipids in 7/24  5. COPD: Severe by PFTs on 5/21 CPX. COPD plays a significant role in her dyspnea. She has finally quit smoking. Follows with pulmonary.  - On prednisone  for CAP since last admission. Is now on 10mg  + round of doxycycline . Will need to follow volume status closely with increased Prednisone .   Follow-up in 3 weeks with Dr. Rolan (volume, titrate GDMT) at [facility]  Mersadies Petree, NP 02/03/2023

## 2023-02-04 ENCOUNTER — Ambulatory Visit: Payer: Medicare Other | Admitting: Dermatology

## 2023-02-06 ENCOUNTER — Other Ambulatory Visit: Payer: Self-pay | Admitting: Adult Health

## 2023-02-06 NOTE — Telephone Encounter (Signed)
Patient wants to know how long she should be taking the 5mg  of prednisone. Please call and advise [phone]   She also wants to know if she has been approved for the [facility].

## 2023-02-10 ENCOUNTER — Telehealth (HOSPITAL_COMMUNITY): Payer: Self-pay | Admitting: Cardiology

## 2023-02-10 ENCOUNTER — Ambulatory Visit: Payer: Medicare Other | Admitting: Primary Care

## 2023-02-10 ENCOUNTER — Ambulatory Visit (INDEPENDENT_AMBULATORY_CARE_PROVIDER_SITE_OTHER): Payer: Medicare Other

## 2023-02-10 ENCOUNTER — Telehealth: Payer: Self-pay | Admitting: Primary Care

## 2023-02-10 ENCOUNTER — Encounter: Payer: Self-pay | Admitting: Primary Care

## 2023-02-10 VITALS — BP 108/57 | HR 67 | Temp 97.7°F | Ht 61.0 in | Wt 108.0 lb

## 2023-02-10 DIAGNOSIS — J9611 Chronic respiratory failure with hypoxia: Secondary | ICD-10-CM | POA: Diagnosis not present

## 2023-02-10 DIAGNOSIS — I5022 Chronic systolic (congestive) heart failure: Secondary | ICD-10-CM | POA: Diagnosis not present

## 2023-02-10 DIAGNOSIS — J189 Pneumonia, unspecified organism: Secondary | ICD-10-CM

## 2023-02-10 DIAGNOSIS — J449 Chronic obstructive pulmonary disease, unspecified: Secondary | ICD-10-CM

## 2023-02-10 MED ORDER — BREZTRI AEROSPHERE 160-9-4.8 MCG/ACT IN AERO
2.0000 | INHALATION_SPRAY | Freq: Two times a day (BID) | RESPIRATORY_TRACT | Status: DC
Start: 2023-02-10 — End: 2023-05-25

## 2023-02-10 NOTE — Patient Instructions (Addendum)
   During your visit, we discussed your recent hospitalization for pneumonia and your ongoing symptoms, including chest tightness and cough. We reviewed your current medications and made some adjustments to help manage your COPD and heart failure.   YOUR PLAN:  -COPD EXACERBATION: COPD exacerbation means a worsening of your chronic obstructive pulmonary disease symptoms. We will continue your current inhaler and cough medication, and you can use albuterol if you experience shortness of breath or wheezing. You will continue a prednisone taper and maintain a daily dose at 10mg . We will send a sputum sample for testing and await your chest x-ray results. Depending on your insurance, we may add another medication called Ohtuvayre or Daliresp (which is a PED4 inhibitor- see below) to help manage your symptoms. Samples of Breztri provided.   Phosphodiesterase 4 (PDE4) inhibitors are a relatively new class of medicines that have been marketed to improve COPD. They have both bronchodilator and anti?inflammatory effects. Moreover, the two currently available medicines, roflumilast and cilomilast, are taken as a tablet.  -HEART FAILURE: Heart failure means your heart is not pumping blood as well as it should. You will continue taking Lasix at the current dose to help reduce fluid buildup. You will repeat lab work to monitor your potassium levels with cardiology.   -SMOKING CESSATION: You have successfully quit smoking for two months. This is a significant achievement, and we encourage you to continue staying smoke-free.  -OXYGEN THERAPY: Oxygen therapy helps you breathe better by providing extra oxygen. Continue using it as needed, and we will walk you today to qualify for portable oxyen concentrator for convenience.  Orders: Sputum culture  Rx: Prednisone taper Ohtuvayre nebulizer twice daily (specialty pharmacy)  INSTRUCTIONS: Please follow the prednisone taper schedule: 40mg  for 3 days, 30mg  for 3  days, and 20mg  for 3 days, then maintain on 10mg  daily. Continue taking Lasix 80mg  daily (40mg  twice daily). Use your Breztri inhaler, Robitussin DM, and albuterol as needed. We will send a sputum sample for testing and await your chest x-ray results. Repeat lab work will be done to monitor your potassium levels. Consider using your oxygen therapy more regularly, and we will look into a portable concentrator for you.  Follow-up: 4-8 weeks with Dr. Sherene Sires

## 2023-02-10 NOTE — Telephone Encounter (Signed)
Hi Beth - CMAs manage inhaler PAP. Will route to triage for f/u  Chesley Mires, PharmD, MPH, BCPS, CPP Clinical Pharmacist (Rheumatology and Pulmonology)

## 2023-02-10 NOTE — Progress Notes (Unsigned)
@Patient  ID: Kimberly [name], female    DOB: May 24, 1945, 78 y.o.   MRN: [id number]  Chief Complaint  Patient presents with   Follow-up    Referring provider: Corwin Levins, MD  HPI: 78 year old female, former smoker. PMH significant for HTN, VSD s/p repair, CHF, cardiomyopathy, COPD, CAP, acute respiratory failure, GERD, hepatic cirrhosis, osteoporosis, dyslipidemia, ICD placement. Patient of Dr. Sherene Sires.    02/10/2023 Patient presents today for hospital follow-up.  Patient was admitted from 12/19/2022 through 12/22/2022 for community-acquired pneumonia.  She originally presented to the emergency room with dyspnea and productive cough.  Chest x-ray on arrival concerning for right upper lung opacity.  CT was consistent with focal pneumonitis.  Patient was treated for community-acquired pneumonia and COPD exacerbation.  She was given IV antibiotics, steroids and nebulizer treatments.  Patient saw gradual improvement in her breathing but was unable to wean off oxygen.  She was discharged with home O2, higher dose of Lasix (40 mg) and 4 days of additional antibiotics (Augmentin) as well as a steroid taper..  Patient has multiple underlying comorbidities including severe COPD/emphysema, chronic systolic heart failure, VSD status post repair x 2, cardiomyopathy.  Patient is maintained on [employer], Doxycyline, prednisone, Albutero.   Discussed the use of AI scribe software for clinical note transcription with the patient, who gave verbal consent to proceed.  History of Present Illness   The patient, with a history of severe COPD, emphysema, and heart disease, was recently hospitalized due to a respiratory infection. She presented with shortness of breath and a cough, and a chest x-ray suggested pneumonia. The patient was treated with antibiotics, steroids, and nebulizer treatments, and was discharged with an increased dose of Lasix and a prescription for Augmentin.  Since the hospitalization,  the patient has been experiencing chest tightness and discomfort, particularly on the left side. She describes the discomfort as a feeling of tightness across the chest. The patient has also been producing sputum, which was initially green but has since improved to a milky color.  The patient has been managing her symptoms with Breztri inhaler, Robitussin DM, and nebulizer. She has also been self-administering 10mg  of prednisone daily, after finishing a prescribed course of the medication. The patient has been taking 80mg  of Lasix daily, divided into two doses.  The patient quit smoking two months ago. She has also been using oxygen therapy, which was prescribed during her recent hospitalization. However, she reports rarely using the oxygen due to the inconvenience of the large tank provided.  The patient has a history of frequent hospitalizations, however, this recent hospitalization was her first in the past year. The patient expressed dissatisfaction with her hospital experience, citing poor care and discomfort.      Allergies  Allergen Reactions   Mupirocin Shortness Of Breath and Other (See Comments)    Burning, pain, swelling and sob   Codeine Nausea Only    Immunization History  Administered Date(s) Administered   Fluad Quad(high Dose 65+) 11/27/2021   Fluad Trivalent(High Dose 65+) 11/05/2022   Influenza Split 10/15/2020   Influenza, High Dose Seasonal PF 09/30/2018   Influenza,inj,Quad PF,6+ Mos 10/17/2015   Influenza,inj,quad, With Preservative 11/17/2016   Influenza-Unspecified 09/20/2013, 10/13/2017, 09/30/2018, 10/13/2019, 10/13/2019   PFIZER(Purple Top)SARS-COV-2 Vaccination 03/13/2019, 04/05/2019   Pneumococcal Conjugate-13 02/20/2010, 11/23/2012   Pneumococcal Polysaccharide-23 10/27/2017   Tdap 02/20/2009    Past Medical History:  Diagnosis Date   AICD (automatic cardioverter/defibrillator) present 03/17/2017   Anxiety    Atrial  tachycardia (HCC)    Basal cell  carcinoma    R ant thigh, L pretibia, both txted at [facility]   Bursitis of shoulder, right, adhesive    Cancer (HCC)    CHF (congestive heart failure) (HCC)    Chronic bronchitis (HCC)    "1-2 times/yr" (01/23/2014)   COPD (chronic obstructive pulmonary disease) (HCC)    CVD (cerebrovascular disease)    Dyslipidemia    Dysrhythmia    Frequency of urination    GERD (gastroesophageal reflux disease)    Heart murmur    History of stomach ulcers    HTN (hypertension) 02/22/2011   Migraines    "stopped many years ago" (06/14/2014)   Osteoporosis 08/19/2016   Pericarditis    Pneumonia "10 times" (06/14/2014)   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)    Silent myocardial infarction (HCC) "late 1990's"   Stress incontinence    "was suppose to have been tacked up years ago but I didn't do it"   Syncope, near    Associated with atrial tachycardia-event recorder 1/16   Thoracic outlet syndrome    VSD (ventricular septal defect)     Tobacco History: Social History   Tobacco Use  Smoking Status Former   Current packs/day: 0.50   Average packs/day: 0.5 packs/day for 35.0 years (17.5 ttl pk-yrs)   Types: Cigarettes  Smokeless Tobacco Never  Tobacco Comments   3-4 cigarettes daily. Trying to quit. HS.  Quit smoking 4 months ago.  Sneaks cigarettes.  12/31/2022 hfb   Counseling given: Not Answered Tobacco comments: 3-4 cigarettes daily. Trying to quit. HS.  Quit smoking 4 months ago.  Sneaks cigarettes.  12/31/2022 hfb   Outpatient Medications Prior to Visit  Medication Sig Dispense Refill   albuterol (PROVENTIL) (2.5 MG/3ML) 0.083% nebulizer solution Take 3 mLs (2.5 mg total) by nebulization every 6 (six) hours as needed for wheezing or shortness of breath. TAKE 3 MLS (1 VIAL) BY NEBULIZATION EVERY 6 HOURS AS NEEDED FOR WHEEZING OR SHORTNESS OF BREATH 90 mL 5   albuterol (VENTOLIN HFA) 108 (90 Base) MCG/ACT inhaler Inhale 2 puffs into the  lungs every 6 (six) hours as needed for wheezing or shortness of breath. 8.5 g 5   ALPRAZolam (XANAX) 0.5 MG tablet 1 tab by mouth twice per day as needed 60 tablet 5   aspirin 81 MG tablet Take 1 tablet (81 mg total) by mouth daily. 100 tablet 99   bisoprolol (ZEBETA) 5 MG tablet TAKE 1/2 TABLET (2.5 MG TOTAL) BY MOUTH DAILY (Patient taking differently: Take 2.5 mg by mouth daily.) 45 tablet 3   Budeson-Glycopyrrol-Formoterol (BREZTRI AEROSPHERE) 160-9-4.8 MCG/ACT AERO Inhale 2 puffs into the lungs in the morning and at bedtime. 1 each 11   digoxin (LANOXIN) 0.125 MG tablet TAKE 1/2 TABLET (62.5 MCG TOTAL) BY MOUTH DAILY (Patient taking differently: Take 0.0625 mg by mouth daily.) 45 tablet 3   doxycycline (VIBRA-TABS) 100 MG tablet Take 1 tablet (100 mg total) by mouth 2 (two) times daily. 14 tablet 0   furosemide (LASIX) 20 MG tablet Take 2 tablets (40 mg total) by mouth 2 (two) times daily. 120 tablet 3   predniSONE (DELTASONE) 10 MG tablet Take 1 tablet (10 mg total) by mouth daily with breakfast. 14 tablet 0   rosuvastatin (CRESTOR) 10 MG tablet TAKE 1 TABLET BY MOUTH DAILY 90 tablet 2   sacubitril-valsartan (ENTRESTO) 24-26 MG Take 1 tablet by mouth 2 (two) times daily. Take (1/2) tablet twice  daily     spironolactone (ALDACTONE) 25 MG tablet Take 1 tablet (25 mg total) by mouth at bedtime. 30 tablet 3   tizanidine (ZANAFLEX) 2 MG capsule Take 2 mg by mouth as needed for muscle spasms.     No facility-administered medications prior to visit.   Review of Systems  Review of Systems  Constitutional: Negative.   Respiratory:  Positive for cough and chest tightness.   Cardiovascular: Negative.    Physical Exam  BP (!) 108/57 (BP Location: Left Arm, Patient Position: Sitting, Cuff Size: Normal)   Pulse 67   Temp 97.7 F (36.5 C) (Oral)   Ht 5\' 1"  (1.549 m)   Wt 108 lb (49 kg)   SpO2 96%   BMI 20.41 kg/m  Physical Exam Constitutional:      Appearance: Normal appearance.  HENT:      Head: Normocephalic and atraumatic.  Cardiovascular:     Rate and Rhythm: Normal rate.  Pulmonary:     Effort: Pulmonary effort is normal. No respiratory distress.     Breath sounds: Wheezing present.  Neurological:     General: No focal deficit present.     Mental Status: She is alert and oriented to person, place, and time. Mental status is at baseline.  Psychiatric:        Mood and Affect: Mood normal.        Behavior: Behavior normal.        Thought Content: Thought content normal.        Judgment: Judgment normal.      Lab Results:  CBC    Component Value Date/Time   WBC 7.1 01/12/2023 1421   RBC 3.59 (L) 01/12/2023 1421   HGB 11.0 (L) 01/12/2023 1421   HGB 13.9 10/06/2016 1418   HCT 34.8 (L) 01/12/2023 1421   HCT 42.1 10/06/2016 1418   PLT 202 01/12/2023 1421   PLT 273 10/06/2016 1418   MCV 96.9 01/12/2023 1421   MCV 87 10/06/2016 1418   MCH 30.6 01/12/2023 1421   MCHC 31.6 01/12/2023 1421   RDW 13.8 01/12/2023 1421   RDW 13.8 10/06/2016 1418   LYMPHSABS 1.5 12/22/2022 0718   LYMPHSABS 1.7 01/25/2013 0908   MONOABS 1.0 12/22/2022 0718   EOSABS 0.0 12/22/2022 0718   EOSABS 0.0 01/25/2013 0908   BASOSABS 0.0 12/22/2022 0718   BASOSABS 0.1 01/25/2013 0908    BMET    Component Value Date/Time   NA 137 02/03/2023 1602   NA 143 10/21/2022 1241   K 4.9 02/03/2023 1602   CL 99 02/03/2023 1602   CO2 27 02/03/2023 1602   GLUCOSE 145 (H) 02/03/2023 1602   BUN 27 (H) 02/03/2023 1602   BUN 19 10/21/2022 1241   CREATININE 1.40 (H) 02/03/2023 1602   CREATININE 0.81 11/02/2015 1251   CALCIUM 9.6 02/03/2023 1602   GFRNONAA 39 (L) 02/03/2023 1602   GFRAA >60 08/09/2019 1038    BNP    Component Value Date/Time   BNP 312.8 (H) 02/03/2023 1604    ProBNP    Component Value Date/Time   PROBNP 3,221 (H) 10/01/2016 1001   PROBNP 534.0 (H) 08/26/2016 1022    Imaging: No results found.   Assessment & Plan:   1. Community acquired pneumonia, unspecified  laterality (Primary) - DG Chest 2 View; Future - Respiratory or Resp and Sputum Culture; Future - Respiratory or Resp and Sputum Culture  2. Chronic systolic CHF (congestive heart failure) (HCC)  3. COPD, severe (HCC)  4.  Chronic respiratory failure with hypoxia (HCC)      COPD Exacerbation Recent hospitalization for pneumonia with ongoing symptoms of chest tightness and cough. Sputum color improved with doxycycline. Wheezing noted on exam, more prominent on the left side. -Continue Breztri inhaler, Robitussin DM for cough, and albuterol as needed for breakthrough shortness of breath or wheezing. -Continue prednisone taper (40mg  for 3 days, 30mg  for 3 days, 20mg  for 3 days) and then maintain on 10mg  daily. -Send sputum sample for respiratory culture. -Await chest x-ray results. -Consider addition of PDEF inhibitor (Ohtuvayre or Daliresp) depending on insurance coverage.  Heart Failure Recent increase in Lasix to 80mg  daily (40mg  twice daily) due to hospitalization for pneumonia. BNP levels have decreased since hospitalization. -Continue Lasix 80mg  daily (40mg  twice daily). -Repeat lab work to monitor potassium levels.  Smoking Cessation Quit smoking two months ago. -Encourage continued smoking cessation.  Oxygen Therapy New oxygen therapy initiated during recent hospitalization. -Continue supplemental oxygen 2L as needed to maintain O2 >88-90% -Consider qualification for portable concentrator at follow-up      Glenford Bayley, NP 02/10/2023

## 2023-02-10 NOTE — Telephone Encounter (Signed)
Patient applied for patient assistance for [name], have we heard back

## 2023-02-10 NOTE — Telephone Encounter (Signed)
Pt left VM with concerns with prednisone and abx    Returned call to get additional information  [facility]

## 2023-02-10 NOTE — Telephone Encounter (Signed)
Thanks, sorry about that.

## 2023-02-11 ENCOUNTER — Ambulatory Visit: Payer: Medicare Other | Admitting: Dermatology

## 2023-02-11 ENCOUNTER — Inpatient Hospital Stay: Payer: Medicare HMO | Admitting: Internal Medicine

## 2023-02-11 NOTE — Telephone Encounter (Signed)
Pt left detailed VM on triage line reporting she was seen by pulmonary for SOB  Abx stopped and increase in prednisone Nothing further needed at this time from cardiology

## 2023-02-12 ENCOUNTER — Telehealth: Payer: Self-pay | Admitting: Pharmacist

## 2023-02-12 ENCOUNTER — Other Ambulatory Visit
Admission: RE | Admit: 2023-02-12 | Discharge: 2023-02-12 | Disposition: A | Discharge status: Home / Self Care | Payer: Medicare Other | Source: Ambulatory Visit | Attending: Cardiology | Admitting: Cardiology

## 2023-02-12 DIAGNOSIS — I5022 Chronic systolic (congestive) heart failure: Secondary | ICD-10-CM | POA: Diagnosis not present

## 2023-02-12 LAB — BRAIN NATRIURETIC PEPTIDE: B Natriuretic Peptide: 263.3 pg/mL — ABNORMAL HIGH (ref 0.0–100.0)

## 2023-02-12 LAB — BASIC METABOLIC PANEL
Anion gap: 15 (ref 5–15)
BUN: 39 mg/dL — ABNORMAL HIGH (ref 8–23)
CO2: 24 mmol/L (ref 22–32)
Calcium: 9 mg/dL (ref 8.9–10.3)
Chloride: 94 mmol/L — ABNORMAL LOW (ref 98–111)
Creatinine, Ser: 1.27 mg/dL — ABNORMAL HIGH (ref 0.44–1.00)
GFR, Estimated: 44 mL/min — ABNORMAL LOW (ref >60)
Glucose, Bld: 159 mg/dL — ABNORMAL HIGH (ref 70–99)
Potassium: 3.9 mmol/L (ref 3.5–5.1)
Sodium: 133 mmol/L — ABNORMAL LOW (ref 135–145)

## 2023-02-12 NOTE — Telephone Encounter (Signed)
Received [place] new start paperwork. Submitted with clinicals (no [facility] Medicare card scanned into media tab)  Phone#: [phone] Fax#: [phone]  Chesley Mires, PharmD, MPH, BCPS, CPP Clinical Pharmacist (Rheumatology and Pulmonology)

## 2023-02-13 ENCOUNTER — Ambulatory Visit: Payer: Medicare Other | Admitting: Internal Medicine

## 2023-02-13 ENCOUNTER — Telehealth: Payer: Self-pay | Admitting: Primary Care

## 2023-02-13 LAB — RESPIRATORY CULTURE OR RESPIRATORY AND SPUTUM CULTURE
MICRO NUMBER:: 15981331
RESULT:: NORMAL
SPECIMEN QUALITY:: ADEQUATE

## 2023-02-13 NOTE — Telephone Encounter (Signed)
She was just prescribed Doxycycline on 01/30/23. CXR was negative for acute process, no residual pna. Based on image results. recent abx use and absence of purulent mucus I would not recommend abx at this time. Take mucinex 1200mg  twice daily, use nebulizer 2-3 times a day. If she has a flutter valve use that after nebulizer.

## 2023-02-13 NOTE — Telephone Encounter (Signed)
Patient states still having symptom of cough. Would like antibx Doxycycline called into pharmacy. Pharmacy is [facility]. Patient phone number is [phone].

## 2023-02-13 NOTE — Telephone Encounter (Signed)
I called and spoke with the pt. I informed pt of Beth's message and pt verbalized understanding. NFN.

## 2023-02-13 NOTE — Telephone Encounter (Signed)
I called and spoke with the pt  She c/o cough with clear sputum- @ baseline for her she says She has "pain back in my lungs"- unchanged from baseline  She states her breathing is unchanged from recent ov 02/10/23  Denies any wheezing, fevers, aches  She asks for abx "since it helped me feel better before"  Please advise, thanks!  Allergies  Allergen Reactions   Mupirocin Shortness Of Breath and Other (See Comments)    Burning, pain, swelling and sob   Codeine Nausea Only

## 2023-02-16 MED ORDER — PREDNISONE 10 MG PO TABS: 10.0000 mg | ORAL_TABLET | Freq: Every day | ORAL | 1 refills | Status: DC

## 2023-02-16 NOTE — Telephone Encounter (Signed)
I called and spoke with the pt. Pt states she needs a refill on her 10 mg prednisone. Pt also wants to know if we can order her a POC. I informed the pt that she would have to be qualified for one first and she has an upcoming appointment with Dr Sherene Sires in Feb 2025. Pt would like to know about her [place] paperwork for [employer]. Sending to Pharmacy for this. Sending in 10 Mg Prednisone for pt, vrbal Ok from [place], NP. Nfn.

## 2023-02-16 NOTE — Telephone Encounter (Signed)
SEE NOTE

## 2023-02-16 NOTE — Telephone Encounter (Signed)
Received fax from [employer] stating that pt has been successfully enrolled into the program. Per benefits investigation there is no PA required through pharmacy benefits at this time. Pt is required to fill through [facility] [phone]). This fax has been sent to the scan center for retention.

## 2023-02-16 NOTE — Telephone Encounter (Signed)
Buelah Manis received fax from [employer] and brought it to our attention, which states that the application could not be processed due to missing due to missing Medical insurance info, pharmacy insurance info, and nebulizer compressor prescription.  BW signed additional Rx portion and we ensured that every box was checked. Printed out hard copies of both Medicare cards. All information has been faxed to [employer].   Will await additional correspondence.

## 2023-02-17 NOTE — Telephone Encounter (Signed)
Pt made aware application for [name] has been submitted. I don't see application for AZ&ME patient asked if it could be mailed. It has been placed in outgoing mail.NFN

## 2023-02-19 ENCOUNTER — Ambulatory Visit: Payer: Medicare Other | Admitting: Dermatology

## 2023-02-19 NOTE — Telephone Encounter (Signed)
Mailed out another PAP application to be completed. Pt says she completed 2 forms it looks like there were 2 forms for neb assistance.Patient understands forms are being mailed.

## 2023-02-24 ENCOUNTER — Encounter: Payer: Medicare HMO | Admitting: Cardiology

## 2023-02-24 DIAGNOSIS — J441 Chronic obstructive pulmonary disease with (acute) exacerbation: Secondary | ICD-10-CM | POA: Diagnosis not present

## 2023-02-26 ENCOUNTER — Ambulatory Visit: Payer: Medicare Other | Admitting: Cardiology

## 2023-02-26 ENCOUNTER — Other Ambulatory Visit
Admission: RE | Admit: 2023-02-26 | Discharge: 2023-02-26 | Disposition: A | Discharge status: Home / Self Care | Payer: Medicare Other | Source: Ambulatory Visit | Attending: Cardiology | Admitting: Cardiology

## 2023-02-26 VITALS — BP 102/50 | HR 79 | Wt 113.0 lb

## 2023-02-26 DIAGNOSIS — I5022 Chronic systolic (congestive) heart failure: Secondary | ICD-10-CM

## 2023-02-26 LAB — BASIC METABOLIC PANEL
Anion gap: 12 (ref 5–15)
BUN: 20 mg/dL (ref 8–23)
CO2: 29 mmol/L (ref 22–32)
Calcium: 8.8 mg/dL — ABNORMAL LOW (ref 8.9–10.3)
Chloride: 95 mmol/L — ABNORMAL LOW (ref 98–111)
Creatinine, Ser: 1.02 mg/dL — ABNORMAL HIGH (ref 0.44–1.00)
GFR, Estimated: 57 mL/min — ABNORMAL LOW (ref >60)
Glucose, Bld: 156 mg/dL — ABNORMAL HIGH (ref 70–99)
Potassium: 3.7 mmol/L (ref 3.5–5.1)
Sodium: 136 mmol/L (ref 135–145)

## 2023-02-26 LAB — LIPID PANEL
Cholesterol: 200 mg/dL (ref 0–200)
HDL: 118 mg/dL (ref >40)
LDL Cholesterol: 66 mg/dL (ref 0–99)
Total CHOL/HDL Ratio: 1.7 {ratio}
Triglycerides: 80 mg/dL (ref <150)
VLDL: 16 mg/dL (ref 0–40)

## 2023-02-26 LAB — BRAIN NATRIURETIC PEPTIDE: B Natriuretic Peptide: 604.8 pg/mL — ABNORMAL HIGH (ref 0.0–100.0)

## 2023-02-26 LAB — DIGOXIN LEVEL: Digoxin Level: 0.7 ng/mL — ABNORMAL LOW (ref 0.8–2.0)

## 2023-02-26 MED ORDER — SACUBITRIL-VALSARTAN 24-26 MG PO TABS: 1.0000 | ORAL_TABLET | Freq: Two times a day (BID) | ORAL | 11 refills | Status: DC

## 2023-02-26 NOTE — Patient Instructions (Addendum)
 INCREASE LASIX  TO 40 MG TWICE DAILY  START TAKING 1 FULL TABLET OF YOUR ENTRESTO  24-26 MG TWICE DAILY   Go over to the [facility]. Go pass the gift shop and have your blood work completed.  We will only call you if the results are abnormal or if the provider would like to make medication changes.

## 2023-02-26 NOTE — Progress Notes (Signed)
 PCP: Dr. Norleen Cardiology: Dr. Okey HF Cardiology: Dr. Rolan  Chief complaint: CHF  78 y.o.with history of VSD repair as a child, COPD/active smoking, PAD, and chronic systolic CHF.  Patient has a long history of cardiomyopathy.  She had VSD repaired as a child and imaging has not showed residual VSD.  Cardiac MRI in 11/09 showed EF 36%.  Echo in 12/15 showed EF 40-45%.  Echo in 3/18 showed EF down to 15-20% with moderate RV systolic dysfunction.  RHC/LHC in 10/18 showed nonobstructive coronary but was concerning for low output (CI 1.33).  She had a nonhealing right lower extremity wound.  Peripheral angiogram with bilateral CFA occlusions and underwent DCB/self expanding stent to to right CFA.    CPX in 1/19 showed moderate to severe functional limitation, combination of HF and lung disease, probably more related to the lung disease.  PFTs in 12/18 showed severe COPD.   She had a [employer] CRT-D device placed.    7/19 peripheral arterial dopplers showed significant in-stent restenosis right CFA stent.  Echo in 4/21 showed EF 35-40% with mild AI.  CPX in 5/21 showed severe COPD, mild-moderate HF limitation.  11/21 Cardiolite showed prior septal and apical infarction, no ischemia.   Had COVID April 2022.   Echo in 9/22 showed EF 35-40% with basal-mid septal akinesis and anterior hypokinesis, no evidence for residual VSD, mildly decreased RV systolic function.  Echo in 11/23 showed EF 40% with basal to mid anteroseptal and inferoseptal akinesis, mild RV dysfunction, PASP 33 mmHg.  Echo in 9/24 showed EF 40%, basal-mid inferoseptal and anteroseptal akinesis, normal RV.   She returns for followup of CHF. She is staying off cigarettes.  She has a lot of anxiety. She continues on prednisone  for COPD/wheezing, has followup with Dr. Darlean.  She has had episodes of nonexertional chest pain, sometimes pleuritic.  Worse when she is under stress.  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 No orthopnea/PND.  No palpitations.  Weight is up 2 lbs.   Labs (12/22): BNP 418, digoxin  0.5, K 5, creatinine 1.17 Labs (8/23): BNP 222, digoxin  0.7, LDL 68, K 4.4, creatinine 9.08 Labs (10/23): K 3.7, creatinine 0.96 Labs (4/24): hgb 14.1, K 4.2, creatinine 1.14, digoxin  level 0.6 Labs (7/24): K 4.9, creatinine 1.05, hgb 13.6, LDL 68 Labs (8/24): digoxin  0.9, BNP 347 Labs (1/25): K 3.9, creatinine 1.27, BNP 263  PMH: 1. VSD: s/p repair at [facility] as child.  From description, sounds like muscular VSD.  2. Atrial tachycardia s/p ablation.  3. Hyperlipidemia 4. COPD: Quit smoking 2024 - PFTs (12/18): Severe obstructive lung disease.  5. Atrial fibrillation: Paroxysmal.   Noted only transiently.   6. PAD:  - Angiogram 11/18 showed totally occluded bilateral CFAs.  Patient had stent to right CFA.  ABIs in 12/18 were normal on right. - ABIs (10/20): ABI 0.8 right, 0.74 left - 12/21 dopplers with R CFA stent in-stenosis.  - ABIs (1/24): normal on right, 0.68 on left with occluded left CFA.  7. Chronic systolic CHF: Nonischemic cardiomyopathy.   - cMRI (11/09): EF 36%, VSD patch in anterior ventricular septum - Echo (12/15): EF 40-45% - Echo (3/18): EF 15-20%, moderate LV dilation, moderate MR, moderate RV dilation with moderately decreased systolic function.  - LHC/RHC (10/18): 3+ MR, EF < 25%, minimal nonobstructive CAD; PA 41/21, LVEDP 18, CI 1.33.  - CPX (1/19): peak VO2 13.4, VE/VCO2 slope 43, RER 1.07.  Mod-severe functional limitation due to combination of HF and lung disease, probably more related to lung disease.  - [name] CRT-D device.  - Echo (7/19): EF 25-30%, mild LV dilation, mild MR, normal RV size and systolic function.  - Echo (4/21): EF 35-40%, mild AI - CPX (5/21): severe COPD, mild-moderate HF limitation.  - Cardiolite (11/21): EF 47%, prior septal and apical infarction, no ischemia. - Echo (9/22): EF 35-40%  with basal-mid septal akinesis and anterior hypokinesis, no evidence for residual VSD, mildly decreased RV systolic function.  - Echo (11/23): EF 40% with basal to mid anteroseptal and inferoseptal akinesis, mild RV dysfunction, PASP 33 mmHg.  - Echo (9/24): EF 40%, basal-mid inferoseptal and anteroseptal akinesis, normal RV.  8. PVCs: Zio patch 9/19 with 6% PVCs.  9. COVID-19 (4/22)  SH: Smoker, lives in [place], married.  FH: Mother with PAD, sister died at birth from congenital heart disease.   ROS: All systems reviewed and negative except as per HPI.   Current Outpatient Medications  Medication Sig Dispense Refill   albuterol  (PROVENTIL ) (2.5 MG/3ML) 0.083% nebulizer solution Take 3 mLs (2.5 mg total) by nebulization every 6 (six) hours as needed for wheezing or shortness of breath. TAKE 3 MLS (1 VIAL) BY NEBULIZATION EVERY 6 HOURS AS NEEDED FOR WHEEZING OR SHORTNESS OF BREATH 90 mL 5   albuterol  (VENTOLIN  HFA) 108 (90 Base) MCG/ACT inhaler Inhale 2 puffs into the lungs every 6 (six) hours as needed for wheezing or shortness of breath. 8.5 g 5   ALPRAZolam  (XANAX ) 0.5 MG tablet 1 tab by mouth twice per day as needed 60 tablet 5   aspirin  81 MG tablet Take 1 tablet (81 mg total) by mouth daily. 100 tablet 99   bisoprolol  (ZEBETA ) 5 MG tablet TAKE 1/2 TABLET (2.5 MG TOTAL) BY MOUTH DAILY (Patient taking differently: Take 2.5 mg by mouth daily.) 45 tablet 3   Budeson-Glycopyrrol-Formoterol  (BREZTRI  AEROSPHERE) 160-9-4.8 MCG/ACT AERO Inhale 2 puffs into the lungs in the morning and at bedtime. 1 each 11   Budeson-Glycopyrrol-Formoterol  (BREZTRI  AEROSPHERE) 160-9-4.8 MCG/ACT AERO Inhale 2 puffs into the lungs in the morning and at bedtime.     digoxin  (LANOXIN ) 0.125 MG tablet TAKE 1/2 TABLET (62.5 MCG TOTAL) BY MOUTH DAILY (Patient taking differently: Take 0.0625 mg by mouth daily.) 45 tablet 3   doxycycline  (VIBRA -TABS) 100 MG tablet Take 1 tablet (100 mg total) by mouth 2 (two) times  daily. 14 tablet 0   furosemide  (LASIX ) 20 MG tablet Take 2 tablets (40 mg total) by mouth 2 (two) times daily. 120 tablet 3   predniSONE  (DELTASONE ) 10 MG tablet Take 1 tablet (10 mg total) by mouth daily with breakfast. 14 tablet 0   predniSONE  (DELTASONE ) 10 MG tablet Take 1 tablet (10 mg total) by mouth daily with breakfast. 30 tablet 1   rosuvastatin  (CRESTOR ) 10 MG tablet TAKE 1 TABLET BY MOUTH DAILY 90 tablet 2   sacubitril -valsartan  (ENTRESTO ) 24-26 MG Take 1 tablet by mouth 2 (two) times daily. 60 tablet 11   spironolactone  (ALDACTONE ) 25 MG tablet Take 1 tablet (25 mg total) by mouth at bedtime. 30 tablet 3   tizanidine  (ZANAFLEX ) 2 MG capsule Take 2 mg by mouth as needed for muscle spasms.     No current facility-administered medications for this visit.   BP (!) 102/50   Pulse 79   Wt 113 lb (51.3 kg)   SpO2 97%   BMI 21.35 kg/m   Wt Readings from Last 3  Encounters:  02/26/23 113 lb (51.3 kg)  02/10/23 108 lb (49 kg)  02/03/23 111 lb 3.2 oz (50.4 kg)   General: NAD Neck: JVP 8-9 cm with HJR, no thyromegaly or thyroid  nodule.  Lungs: Decreased BS bilaterally with wheezing.  CV: Nondisplaced PMI.  Heart regular S1/S2, no S3/S4, no murmur.  No peripheral edema.  No carotid bruit.  Difficult to palpate pedal pulses.  Abdomen: Soft, nontender, no hepatosplenomegaly, no distention.  Skin: Intact without lesions or rashes.  Neurologic: Alert and oriented x 3.  Psych: Normal affect. Extremities: No clubbing or cyanosis.  HEENT: Normal.   Assessment/Plan: 1.  PAD: She had endovascular intervention to occluded right CFA in 11/18, but subsequent arterial showed in-stent restenosis. She has occluded left CFA also that was not intervened on. Minimal claudication.  She has quit smoking.  Follows with Dr. Darron for PV with plan for medical management unless claudication worsens.  - Continue statin.  2. CAD: Nonobstructive on 10/18 cath. Cardiolite in 11/21 with no evidence of  ischemia. She has atypical chest pain, nonexertional.  This may be related to COPD/wheezing (actively wheezing on exam).  - She is on ASA 81 and statin.  - No ischemic evaluation for now as I think her symptoms are pulmonary, but consider cardiac PET if worsen or do not resolve.  3. Hyperlipidemia: Continue crestor , check lipids today.  4. COPD: Severe by PFTs on 5/21 CPX. COPD plays a significant role in her dyspnea. She has finally quit smoking.  Follows with pulmonary.  She is now on chronic prednisone .  Wheezing on exam today.  5. Chronic systolic CHF: Nonischemic cardiomyopathy.  RHC/LHC in 10/18 with no nonobstructive coronary disease but CI was very low at 1.33.  Her low cardiac output was out of proportion to her symptoms.  [name] CRT-D device.  CPX in 5/21 showed severe COPD but only mild-moderate HF limitation.   Echo with EF stable at 35-40% in 9/22 and EF 40% on 11/23 echo.  Echo in 9/24 was stable with EF 40%, basal-mid inferoseptal and anteroseptal akinesis, normal RV. NYHA class II-III, symptoms confounded by severe COPD.  She does appear mildly volume overloaded on exam today.     - Increase Lasix  to 40 mg bid, BMET/BNP today and in 10 days.  - Increase Entresto  to 24/26 bid.  - Continue bisoprolol  2.5 mg daily. No BP room to increase.  - Continue spironolactone  25 mg daily.  - Continue digoxin , check level today.  - Unable to tolerate Farxiga .   Followup 1 month.   I spent 32 minutes reviewing records, interviewing/examining patient, and managing orders.   Ezra Shuck 02/26/2023

## 2023-03-01 ENCOUNTER — Other Ambulatory Visit: Payer: Self-pay

## 2023-03-01 ENCOUNTER — Emergency Department: Payer: Medicare Other

## 2023-03-01 ENCOUNTER — Emergency Department
Admission: EM | Admit: 2023-03-01 | Discharge: 2023-03-01 | Disposition: A | Discharge status: Home / Self Care | Payer: Medicare Other | Attending: Emergency Medicine | Admitting: Emergency Medicine

## 2023-03-01 DIAGNOSIS — I509 Heart failure, unspecified: Secondary | ICD-10-CM | POA: Diagnosis not present

## 2023-03-01 DIAGNOSIS — S22060A Wedge compression fracture of T7-T8 vertebra, initial encounter for closed fracture: Secondary | ICD-10-CM

## 2023-03-01 DIAGNOSIS — M4854XA Collapsed vertebra, not elsewhere classified, thoracic region, initial encounter for fracture: Secondary | ICD-10-CM | POA: Diagnosis not present

## 2023-03-01 DIAGNOSIS — R0602 Shortness of breath: Secondary | ICD-10-CM | POA: Diagnosis not present

## 2023-03-01 DIAGNOSIS — Z95810 Presence of automatic (implantable) cardiac defibrillator: Secondary | ICD-10-CM | POA: Diagnosis not present

## 2023-03-01 DIAGNOSIS — Z950 Presence of cardiac pacemaker: Secondary | ICD-10-CM | POA: Insufficient documentation

## 2023-03-01 DIAGNOSIS — S22059A Unspecified fracture of T5-T6 vertebra, initial encounter for closed fracture: Secondary | ICD-10-CM | POA: Diagnosis not present

## 2023-03-01 DIAGNOSIS — M546 Pain in thoracic spine: Secondary | ICD-10-CM | POA: Diagnosis present

## 2023-03-01 DIAGNOSIS — M5134 Other intervertebral disc degeneration, thoracic region: Secondary | ICD-10-CM | POA: Diagnosis not present

## 2023-03-01 DIAGNOSIS — J449 Chronic obstructive pulmonary disease, unspecified: Secondary | ICD-10-CM | POA: Insufficient documentation

## 2023-03-01 DIAGNOSIS — M40204 Unspecified kyphosis, thoracic region: Secondary | ICD-10-CM | POA: Diagnosis not present

## 2023-03-01 DIAGNOSIS — R079 Chest pain, unspecified: Secondary | ICD-10-CM | POA: Diagnosis not present

## 2023-03-01 DIAGNOSIS — Z20822 Contact with and (suspected) exposure to covid-19: Secondary | ICD-10-CM | POA: Diagnosis not present

## 2023-03-01 DIAGNOSIS — R29890 Loss of height: Secondary | ICD-10-CM | POA: Diagnosis not present

## 2023-03-01 LAB — COMPREHENSIVE METABOLIC PANEL
ALT: 22 U/L (ref 0–44)
AST: 23 U/L (ref 15–41)
Albumin: 3.6 g/dL (ref 3.5–5.0)
Alkaline Phosphatase: 55 U/L (ref 38–126)
Anion gap: 13 (ref 5–15)
BUN: 23 mg/dL (ref 8–23)
CO2: 31 mmol/L (ref 22–32)
Calcium: 9 mg/dL (ref 8.9–10.3)
Chloride: 91 mmol/L — ABNORMAL LOW (ref 98–111)
Creatinine, Ser: 1.16 mg/dL — ABNORMAL HIGH (ref 0.44–1.00)
GFR, Estimated: 49 mL/min — ABNORMAL LOW (ref >60)
Glucose, Bld: 129 mg/dL — ABNORMAL HIGH (ref 70–99)
Potassium: 3.5 mmol/L (ref 3.5–5.1)
Sodium: 135 mmol/L (ref 135–145)
Total Bilirubin: 0.8 mg/dL (ref 0.0–1.2)
Total Protein: 7.1 g/dL (ref 6.5–8.1)

## 2023-03-01 LAB — CBC WITH DIFFERENTIAL/PLATELET
Abs Immature Granulocytes: 0.04 10*3/uL (ref 0.00–0.07)
Basophils Absolute: 0.1 10*3/uL (ref 0.0–0.1)
Basophils Relative: 1 %
Eosinophils Absolute: 0.1 10*3/uL (ref 0.0–0.5)
Eosinophils Relative: 1 %
HCT: 39.2 % (ref 36.0–46.0)
Hemoglobin: 12.9 g/dL (ref 12.0–15.0)
Immature Granulocytes: 0 %
Lymphocytes Relative: 20 %
Lymphs Abs: 1.9 10*3/uL (ref 0.7–4.0)
MCH: 30.4 pg (ref 26.0–34.0)
MCHC: 32.9 g/dL (ref 30.0–36.0)
MCV: 92.5 fL (ref 80.0–100.0)
Monocytes Absolute: 0.6 10*3/uL (ref 0.1–1.0)
Monocytes Relative: 7 %
Neutro Abs: 7.1 10*3/uL (ref 1.7–7.7)
Neutrophils Relative %: 71 %
Platelets: 311 10*3/uL (ref 150–400)
RBC: 4.24 MIL/uL (ref 3.87–5.11)
RDW: 14.3 % (ref 11.5–15.5)
WBC: 9.8 10*3/uL (ref 4.0–10.5)
nRBC: 0 % (ref 0.0–0.2)

## 2023-03-01 LAB — RESP PANEL BY RT-PCR (RSV, FLU A&B, COVID)  RVPGX2
Influenza A by PCR: NEGATIVE
Influenza B by PCR: NEGATIVE
Resp Syncytial Virus by PCR: NEGATIVE
SARS Coronavirus 2 by RT PCR: NEGATIVE

## 2023-03-01 LAB — TROPONIN I (HIGH SENSITIVITY)
Troponin I (High Sensitivity): 27 ng/L — ABNORMAL HIGH (ref <18)
Troponin I (High Sensitivity): 25 ng/L — ABNORMAL HIGH (ref <18)

## 2023-03-01 MED ORDER — OXYCODONE-ACETAMINOPHEN 5-325 MG PO TABS: 1.0000 | ORAL_TABLET | ORAL | 0 refills | Status: DC | PRN

## 2023-03-01 MED ORDER — OXYCODONE-ACETAMINOPHEN 5-325 MG PO TABS
2.0000 | ORAL_TABLET | Freq: Once | ORAL | Status: AC
  Administered 2023-03-01: 2 via ORAL
  Filled 2023-03-01: qty 2

## 2023-03-01 MED ORDER — LACTATED RINGERS IV BOLUS
1000.0000 mL | Freq: Once | INTRAVENOUS | Status: AC
  Administered 2023-03-01: 1000 mL via INTRAVENOUS

## 2023-03-01 NOTE — ED Notes (Signed)
 Pt to XR

## 2023-03-01 NOTE — ED Notes (Signed)
 Pt ambulated to restroom w steady gate and returned to bed without incident. Pt did appear dyspneic and started utilizing  pursed lip breathing once she returned to the bed. Sat after ambulation 95%. CB within reach, Husband at bedside.

## 2023-03-01 NOTE — ED Provider Notes (Signed)
 [facility] Provider Note   Event Date/Time   First MD Initiated Contact with Patient 03/01/23 [phone]     (approximate) History  Shortness of Breath  HPI Kimberly [name] is a 78 y.o. female with a stated past medical history of heart failure and COPD who presents complaining of worsening chest pain, thoracic back pain, and shortness of breath over the last 4 days.  Patient states that she has been unable to take a deep breath secondary to pain.  Patient denies any recent trauma, travel, or sick contacts.  Patient states she was seen 2 days ago in her cardiology clinic who felt that this was a respiratory issue rather than anything cardiac. ROS: Patient currently denies any vision changes, tinnitus, difficulty speaking, facial droop, sore throat, abdominal pain, nausea/vomiting/diarrhea, dysuria, or weakness/numbness/paresthesias in any extremity   Physical Exam  Triage Vital Signs: ED Triage Vitals  Encounter Vitals Group     BP 03/01/23 0803 (!) 131/91     Systolic BP Percentile --      Diastolic BP Percentile --      Pulse Rate 03/01/23 0803 67     Resp 03/01/23 0803 (!) 22     Temp 03/01/23 0803 98.4 F (36.9 C)     Temp Source 03/01/23 0803 Oral     SpO2 03/01/23 0802 96 %     Weight 03/01/23 0804 113 lb (51.3 kg)     Height 03/01/23 0804 5' 1 (1.549 m)     Head Circumference --      Peak Flow --      Pain Score 03/01/23 0803 10     Pain Loc --      Pain Education --      Exclude from Growth Chart --    Most recent vital signs: Vitals:   03/01/23 1215 03/01/23 1330  BP: (!) 97/55 107/86  Pulse: (!) 59 (!) 58  Resp: 14 (!) 22  Temp:    SpO2: 97% 100%   General: Awake, oriented x4. CV:  Good peripheral perfusion.  Resp:  Normal effort.  Abd:  No distention.  Other:  Elderly well-developed, well-nourished Caucasian female resting on pressure in mild distress secondary to pain ED Results / Procedures / Treatments  Labs (all labs ordered are  listed, but only abnormal results are displayed) Labs Reviewed  COMPREHENSIVE METABOLIC PANEL - Abnormal; Notable for the following components:      Result Value   Chloride 91 (*)    Glucose, Bld 129 (*)    Creatinine, Ser 1.16 (*)    GFR, Estimated 49 (*)    All other components within normal limits  TROPONIN I (HIGH SENSITIVITY) - Abnormal; Notable for the following components:   Troponin I (High Sensitivity) 27 (*)    All other components within normal limits  TROPONIN I (HIGH SENSITIVITY) - Abnormal; Notable for the following components:   Troponin I (High Sensitivity) 25 (*)    All other components within normal limits  RESP PANEL BY RT-PCR (RSV, FLU A&B, COVID)  [id number]  CBC WITH DIFFERENTIAL/PLATELET   EKG ED ECG REPORT I, Artist MARLA Kerns, the attending physician, personally viewed and interpreted this ECG. Date: 03/01/2023 EKG Time: 0804 Rate: 76 Rhythm: Atrially sensed ventricular paced rhythm with occasional PVC QRS Axis: normal Intervals: normal ST/T Wave abnormalities: normal Narrative Interpretation: Atrially sensed ventricular paced rhythm with occasional PVCs no evidence of acute ischemia RADIOLOGY ED MD interpretation: 2 view chest x-ray interpreted independently shows  development of moderate compression fracture in the mid thoracic vertebral body likely at T7 -Agree with radiology assessment Official radiology report(s): CT Thoracic Spine Wo Contrast Result Date: 03/01/2023 CLINICAL DATA:  78 year old female with chest pain and shortness of breath. Thoracic compression fracture on radiographs. EXAM: CT THORACIC SPINE WITHOUT CONTRAST TECHNIQUE: Multidetector CT images of the thoracic were obtained using the standard protocol without intravenous contrast. RADIATION DOSE REDUCTION: This exam was performed according to the departmental dose-optimization program which includes automated exposure control, adjustment of the mA and/or kV according to patient size and/or use  of iterative reconstruction technique. COMPARISON:  Chest radiographs today.  Chest CT 12/19/2022. FINDINGS: Limited cervical spine imaging: Cervicothoracic junction alignment is within normal limits. Thoracic spine segmentation: 12 pairs of ribs, hypoplastic right 1st rib which does not reach the manubrium, sternum. But full size lowest ribs. No C7 cervical ribs are designated. Correlation with radiographs is recommended prior to any operative intervention. Alignment: Exaggerated thoracic kyphosis since November related to T7 compression as below. No significant scoliosis or spondylolisthesis. Vertebrae: Diffuse osteopenia. T7 compression fracture with 50% loss of vertebral body height since November. The compressed body is partially sclerotic. No retropulsion. T7 pedicles and posterior elements appear intact. New nondisplaced fracture of the T6 spinous process also (series 5, image 36). Stable thoracic vertebral height elsewhere including mild chronic T9 endplate depression. Visible posterior ribs appear intact. No other acute osseous abnormality identified. Paraspinal and other soft tissues: Chronic emphysema, Calcified aortic atherosclerosis., cardiac pacemaker. New right lower lobe mixed solid and sub solid lung nodule is 9 mm on series 3, image 88. No pleural effusion. Aberrant right subclavian artery origin, normal variant. No evidence of pericardial effusion. Negative visible noncontrast upper abdominal viscera. Minimal paraspinal soft tissue edema or contusion at the T7 level. Disc levels: Mild for age thoracic spine degeneration. No CT evidence of thoracic spinal stenosis. IMPRESSION: 1. Osteopenia with Moderate T7 compression fracture, new since November otherwise age indeterminate. Adjacent T6 nondisplaced spinous process fracture. 50% loss of height with no retropulsion or complicating features. If specific therapy such as vertebroplasty is desired, noncontrast MRI or Nuclear Medicine Whole-body Bone  Scan would best determine acuity. 2. Right lower lobe part-solid pulmonary nodule measuring 9 mm is new since November. Emphysema (ICD10-J43.9). Per Fleischner Society Guidelines, recommend a non-contrast Chest CT at 3-6 months to confirm persistence. If unchanged and the solid component remains < 6 mm, an annual non-contrast Chest CT should be performed for 5 years. If the solid component is 6-8 mm on follow-up, recommend biopsy or resection. If the solid component is > 8 mm on follow-up, recommend PET/CT, biopsy or resection. These guidelines do not apply to immunocompromised patients and patients with cancer. Reference: Radiology. 2017; 284(1):228-43. 3.  Aortic Atherosclerosis (ICD10-I70.0). Electronically Signed   By: VEAR Hurst M.D.   On: 03/01/2023 09:53   DG Chest 2 View Result Date: 03/01/2023 CLINICAL DATA:  Shortness of breath.  Chest pain for 4 days. EXAM: CHEST - 2 VIEW COMPARISON:  02/10/2023 FINDINGS: Cardiomediastinal silhouette and pulmonary vasculature are within normal limits. Lungs are clear. Right chest wall AICD is unchanged in configuration. Median sternotomy changes again seen. Diffuse osteopenia. Interval development of compression fracture, likely of the T7 vertebral body. IMPRESSION: 1. No acute cardiopulmonary process. 2. Interval development of moderate compression fracture of midthoracic vertebral body, likely T7. Electronically Signed   By: Aliene Lloyd M.D.   On: 03/01/2023 08:36   PROCEDURES: Critical Care performed: No Procedures MEDICATIONS ORDERED  IN ED: Medications  oxyCODONE -acetaminophen  (PERCOCET/ROXICET) 5-325 MG per tablet 2 tablet (2 tablets Oral Given 03/01/23 0908)  lactated ringers  bolus 1,000 mL (1,000 mLs Intravenous New Bag/Given 03/01/23 1043)   IMPRESSION / MDM / ASSESSMENT AND PLAN / ED COURSE  I reviewed the triage vital signs and the nursing notes.                             The patient is on the cardiac monitor to evaluate for evidence of arrhythmia  and/or significant heart rate changes. Patient's presentation is most consistent with acute presentation with potential threat to life or bodily function.  This patient presents to the ED for concern of chest pain and shortness of breath as well as midthoracic back pain, this involves an extensive number of treatment options, and is a complaint that carries with it a high risk of complications and morbidity.  The differential diagnosis includes ACS, PE, pneumothorax, spinal injury Co morbidities that complicate the patient evaluation  Extensive cardiac history including pacemaker in place and chronic digoxin , COPD Additional history obtained:  Additional history obtained from husband at bedside  External records from outside source obtained and reviewed including recent cardiology office visit on 02/26/2023 Lab Tests:  I Ordered, and personally interpreted labs.  The pertinent results include: Downtrending troponin from 27-25, creatinine 1.16 Imaging Studies ordered:  I ordered imaging studies including 2 view chest x-ray and CT of the thoracic spine  I independently visualized and interpreted imaging which showed approximately 50% height loss of anterior compression fracture at T7  I agree with the radiologist interpretation Cardiac Monitoring: / EKG:  The patient was maintained on a cardiac monitor.  I personally viewed and interpreted the cardiac monitored which showed an underlying rhythm of: Ventricularly sensed atrial paced rhythm Consultations Obtained:  I requested consultation with the Dr.Codd,  and discussed lab and imaging findings as well as pertinent plan - they recommend: TLSO brace and follow-up with outpatient neurosurgery Problem List / ED Course / Critical interventions / Medication management  T7 50% height loss compression fracture  I ordered medication including oxycodone /Tylenol  for pain  Reevaluation of the patient after these medicines showed that the patient  improved  I have reviewed the patients home medicines and have made adjustments as needed Test / Admission - Considered:  Given patient's limited mobility and pain, she was offered admission for further evaluation for possible home health however she stated that she would be more comfortable going home and following up with her primary care physician for evaluation Dispo: Discharge home with neurology and PCP follow-up       FINAL CLINICAL IMPRESSION(S) / ED DIAGNOSES   Final diagnoses:  Compression fracture of T7 vertebra, initial encounter (HCC)  SOB (shortness of breath)   Rx / DC Orders   ED Discharge Orders          Ordered    oxyCODONE -acetaminophen  (PERCOCET) 5-325 MG tablet  Every 4 hours PRN        03/01/23 1415           Note:  This document was prepared using Dragon voice recognition software and may include unintentional dictation errors.   Jossie Artist POUR, MD 03/01/23 [phone]

## 2023-03-01 NOTE — ED Triage Notes (Signed)
 Pt c/o SOB and chest tightness x4 days.  Pain score 10/10.  Pt reports she was admitted for PNA and COPD in Nov/Dec and has been taking prednisone  since.    Pt reports she was seen by Cardiology x3 days ago and they feel her issue is respiratory.  Pt uses O2 PRN.

## 2023-03-01 NOTE — Progress Notes (Signed)
 Orthopedic Tech Progress Note Patient Details:  Kimberly [name] [birth year] [id number]  Patient ID: [name], female   DOB: Aug 14, 1945, 78 y.o.   MRN: [id number] TLSO ordered from [facility]. Toi Foster 03/01/2023, 12:40 PM

## 2023-03-01 NOTE — Discharge Instructions (Addendum)
 Please use ibuprofen (Motrin) up to 800 mg every 8 hours, naproxen (Naprosyn) up to 500 mg every 12 hours, and/or acetaminophen (Tylenol) up to 4 g/day for any continued pain.  Please do not use this medication regimen for longer than 7 days

## 2023-03-01 NOTE — Progress Notes (Signed)
 Was contacted by the emergency room regarding this patient.  She is apparently 78 year old female with past medical history significant for significant cardiac disease including CHF and COPD who presents now 2 days after severe coughing fit resulting in back pain and radiation to the chest with CT scan imaging of the thoracic spine demonstrating a 50% loss of height T7 compression fracture.  She per report is neurologically intact.  She has been worked up for potential cardiovascular event and this apparently has been negative at this time.  Is reassuring that her neurologic exam is intact and the compression fracture could likely have resulted from the severe coughing episode that she had several days prior in the setting of osteopenia.  AP: No acute neurosurgical intervention.  TLSO brace for comfort.  Patient may mobilize as tolerated.  Will arrange outpatient neurosurgery follow-up.  Belvie PARAS. Deatrice, MD Neurosurgery

## 2023-03-01 NOTE — ED Notes (Signed)
 Pt to CT

## 2023-03-01 NOTE — ED Notes (Signed)
 This RN called Ortho for TLSO brace

## 2023-03-01 NOTE — ED Notes (Signed)
 This RN placed pt on 2L [place] after de stat @ 89-93%

## 2023-03-02 ENCOUNTER — Encounter: Payer: Self-pay | Admitting: Internal Medicine

## 2023-03-02 ENCOUNTER — Other Ambulatory Visit: Payer: Self-pay | Admitting: Cardiovascular Disease

## 2023-03-02 ENCOUNTER — Telehealth: Payer: Self-pay | Admitting: Internal Medicine

## 2023-03-02 ENCOUNTER — Other Ambulatory Visit: Payer: Self-pay | Admitting: Internal Medicine

## 2023-03-02 ENCOUNTER — Telehealth (HOSPITAL_COMMUNITY): Payer: Self-pay | Admitting: Cardiology

## 2023-03-02 NOTE — Telephone Encounter (Signed)
 Lm x1 for patient.

## 2023-03-02 NOTE — Telephone Encounter (Signed)
 Pt states she hurt her back last week and had to go to ER this weekend. She states in the ER they diagnosed a compression fracture, placed pt in brace and referred her to neurosurgery.  Pt was inquiring about possible home health to help.  Pt will speak with PCP first and will follow up with heart failure clinic if needed. No further questions or concerns at this time.

## 2023-03-02 NOTE — Telephone Encounter (Signed)
 [facility] HF pt, mess forwarded

## 2023-03-02 NOTE — Telephone Encounter (Signed)
 Please see recent ER visit. PT fractured spine when sneezing. ER has her on Oxycodone /Percocet. Dr. Waymond Hailey has her on Pred. ER Dr. said she needs to get in touch w/Dr. Waymond Hailey to see if she can come off Pred because it slows the healing process. Pls call PT to advise @ [phone]

## 2023-03-02 NOTE — Telephone Encounter (Signed)
 Patient would like to get a home health care nurse.   Patient is Dr. Charline [name] patients.   Please call patient back at [phone].

## 2023-03-03 ENCOUNTER — Telehealth: Payer: Self-pay

## 2023-03-03 NOTE — Telephone Encounter (Signed)
The ER called Dr Madaline Brilliant about this patient when he was covering call for our department. Per Dr Madaline Brilliant:  "43F bad cardiac history and osteopenia and CHF p/w 2 days back pain after severe coughing fit with radiation to chest.  Negative cardiac workup.  CT Chest with T7 compression fracture 50% LOH, no fragments, no compression.  Neuro intact.  PLAN: TLSO for comfort, mobilize as needed Will need outpatient clinic follow-up Was d/c'd from ED"

## 2023-03-03 NOTE — Telephone Encounter (Signed)
I did not prescribe it but I would take one half daily x one week then one half every other day x one week then off and see me by week 3

## 2023-03-03 NOTE — Telephone Encounter (Signed)
I called and spoke with pt. I informed pt of Dr Thurston Hole message. Pt verbalized understanding. NFN

## 2023-03-03 NOTE — Telephone Encounter (Signed)
Spoke with the pt  She states her neurosurgeon asked that she call here and ask to d/c pred for now  She is dealing with a fracture after sneezing a few days ago  She is taking pred 10 mg daily currently  Please advise thanks  CT impression copied below as FYI  03/01/23 IMPRESSION: 1. Osteopenia with Moderate T7 compression fracture, new since November otherwise age indeterminate. Adjacent T6 nondisplaced spinous process fracture. 50% loss of height with no retropulsion or complicating features. If specific therapy such as vertebroplasty is desired, noncontrast MRI or Nuclear Medicine Whole-body Bone Scan would best determine acuity.   2. Right lower lobe part-solid pulmonary nodule measuring 9 mm is new since November. Emphysema (ICD10-J43.9). Per Fleischner Society Guidelines, recommend a non-contrast Chest CT at 3-6 months to confirm persistence. If unchanged and the solid component remains < 6 mm, an annual non-contrast Chest CT should be performed for 5 years. If the solid component is 6-8 mm on follow-up, recommend biopsy or resection. If the solid component is > 8 mm on follow-up, recommend PET/CT, biopsy or resection. These guidelines do not apply to immunocompromised patients and patients with cancer. Reference: Radiology. 2017; 284(1):228-43.   3.  Aortic Atherosclerosis (ICD10-I70.0).

## 2023-03-03 NOTE — Telephone Encounter (Signed)
Patient checking on message for medication. Patient phone number is [phone].

## 2023-03-03 NOTE — Telephone Encounter (Signed)
Scheduled with Drake Leach, PA-C on 03/18/23.

## 2023-03-06 ENCOUNTER — Telehealth (INDEPENDENT_AMBULATORY_CARE_PROVIDER_SITE_OTHER): Payer: Medicare Other | Admitting: Internal Medicine

## 2023-03-06 DIAGNOSIS — I5022 Chronic systolic (congestive) heart failure: Secondary | ICD-10-CM

## 2023-03-06 DIAGNOSIS — J441 Chronic obstructive pulmonary disease with (acute) exacerbation: Secondary | ICD-10-CM | POA: Diagnosis not present

## 2023-03-06 DIAGNOSIS — R911 Solitary pulmonary nodule: Secondary | ICD-10-CM

## 2023-03-06 DIAGNOSIS — S22060D Wedge compression fracture of T7-T8 vertebra, subsequent encounter for fracture with routine healing: Secondary | ICD-10-CM | POA: Diagnosis not present

## 2023-03-06 DIAGNOSIS — M810 Age-related osteoporosis without current pathological fracture: Secondary | ICD-10-CM

## 2023-03-06 DIAGNOSIS — K7460 Unspecified cirrhosis of liver: Secondary | ICD-10-CM

## 2023-03-06 MED ORDER — OXYCODONE-ACETAMINOPHEN 5-325 MG PO TABS: 1.0000 | ORAL_TABLET | ORAL | 0 refills | Status: DC | PRN

## 2023-03-06 NOTE — Patient Instructions (Signed)
Please continue all other medications as before, and refills done for the percocet  You will be contacted regarding the referral for: Home Health  Please have the pharmacy call with any other refills you may need  Please keep your appointments with your specialists as you may have planned - Neurosurgury in [place] as you mentioned  We need to see you back in 3  mo for bone density testing, and CT chest wo Contrast for the new 9 mm lung nodule

## 2023-03-06 NOTE — Progress Notes (Signed)
Patient ID: Kimberly [name], female   DOB: November 01, 1945, 78 y.o.   MRN: [id number]  Virtual Visit via Video Note  I connected with Kimberly [name] on 03/08/23 at  9:00 AM EST by a video enabled telemedicine application and verified that I am speaking with the correct person using two identifiers.  Location of all participants today Patient: at home Provider: at office   I discussed the limitations of evaluation and management by telemedicine and the availability of in person appointments. The patient expressed understanding and agreed to proceed.  History of Present Illness: Here to f/u after recent unfortunately sneeze and T7 compression fx feb 9, also recent copd exacerbation and weaning down prednsone overall improving but pain still mod to severe, constant,  Pt denies chest pain, increased sob or doe, wheezing, orthopnea, PND, increased LE swelling, palpitations, dizziness or syncope.   Pt denies polydipsia, polyuria, or new focal neuro s/s.    Pt denies fever, wt loss, night sweats, loss of appetite, or other constitutional symptoms    Needs HH with aide, pain control.  Also CT scan in ED with new 9 mm pulm nodule - will need f/u at 3 mo.  Also will need DXA when able to tolerate lying down.   Past Medical History:  Diagnosis Date   AICD (automatic cardioverter/defibrillator) present 03/17/2017   Anxiety    Atrial tachycardia (HCC)    Basal cell carcinoma    R ant thigh, L pretibia, both txted at [facility]   Bursitis of shoulder, right, adhesive    Cancer (HCC)    CHF (congestive heart failure) (HCC)    Chronic bronchitis (HCC)    "1-2 times/yr" (01/23/2014)   COPD (chronic obstructive pulmonary disease) (HCC)    CVD (cerebrovascular disease)    Dyslipidemia    Dysrhythmia    Frequency of urination    GERD (gastroesophageal reflux disease)    Heart murmur    History of stomach ulcers    HTN (hypertension) 02/22/2011   Migraines    "stopped many years ago" (06/14/2014)    Osteoporosis 08/19/2016   Pericarditis    Pneumonia "10 times" (06/14/2014)   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)    Silent myocardial infarction (HCC) "late 1990's"   Stress incontinence    "was suppose to have been tacked up years ago but I didn't do it"   Syncope, near    Associated with atrial tachycardia-event recorder 1/16   Thoracic outlet syndrome    VSD (ventricular septal defect)    Past Surgical History:  Procedure Laterality Date   ABDOMINAL AORTOGRAM W/LOWER EXTREMITY N/A 12/17/2016   Procedure: ABDOMINAL AORTOGRAM W/LOWER EXTREMITY;  Surgeon: Iran Ouch, MD;  Location: [facility] INVASIVE CV LAB;  Service: Cardiovascular;  Laterality: N/A;   BIV ICD INSERTION CRT-D N/A 03/17/2017   Procedure: BIV ICD INSERTION CRT-D;  Surgeon: Marinus Maw, MD;  Location: [facility] INVASIVE CV LAB;  Service: Cardiovascular;  Laterality: N/A;   CARDIAC CATHETERIZATION  "quite a few"   CESAREAN SECTION  1972   CHOLECYSTECTOMY OPEN  1970's   CORONARY ANGIOGRAM  2000   No significant CAD   ELECTROPHYSIOLOGIC STUDY N/A 06/14/2014   Procedure: A-Flutter/A-Tach/SVT Ablation;  Surgeon: Marinus Maw, MD;  Location: [facility] INVASIVE CV LAB;  Service: Cardiovascular;  Laterality: N/A;   INSERTION OF ICD  03/17/2017   BIV   MULTIPLE TOOTH EXTRACTIONS     MYRINGOTOMY WITH TUBE PLACEMENT Right 2015   PERIPHERAL VASCULAR  INTERVENTION  12/17/2016   Procedure: PERIPHERAL VASCULAR INTERVENTION;  Surgeon: Iran Ouch, MD;  Location: [facility] INVASIVE CV LAB;  Service: Cardiovascular;;  Right common femoral PTA and Stent   RIGHT/LEFT HEART CATH AND CORONARY ANGIOGRAPHY Bilateral 11/06/2016   Procedure: RIGHT/LEFT HEART CATH AND CORONARY ANGIOGRAPHY;  Surgeon: Iran Ouch, MD;  Location: [facility] INVASIVE CV LAB;  Service: Cardiovascular;  Laterality: Bilateral;   SVT ABLATION  06/14/2014   TUBAL LIGATION  1972   VSD REPAIR  1958; 1967    reports that she has quit smoking.  Her smoking use included cigarettes. She has a 17.5 pack-year smoking history. She has never used smokeless tobacco. She reports that she does not currently use alcohol after a past usage of about 1.0 standard drink of alcohol per week. She reports that she does not use drugs. family history includes Alcohol abuse in an other family member; Arthritis in some other family members; Cancer in her father, mother, and another family member; Hypertension in some other family members; Stroke in some other family members; Throat cancer in an other family member. Allergies  Allergen Reactions   Mupirocin Shortness Of Breath and Other (See Comments)    Burning, pain, swelling and sob   Codeine Nausea Only   Current Outpatient Medications on File Prior to Visit  Medication Sig Dispense Refill   albuterol (PROVENTIL) (2.5 MG/3ML) 0.083% nebulizer solution Take 3 mLs (2.5 mg total) by nebulization every 6 (six) hours as needed for wheezing or shortness of breath. TAKE 3 MLS (1 VIAL) BY NEBULIZATION EVERY 6 HOURS AS NEEDED FOR WHEEZING OR SHORTNESS OF BREATH 90 mL 5   albuterol (VENTOLIN HFA) 108 (90 Base) MCG/ACT inhaler Inhale 2 puffs into the lungs every 6 (six) hours as needed for wheezing or shortness of breath. 8.5 g 5   ALPRAZolam (XANAX) 0.5 MG tablet TAKE 1 TABLET BY MOUTH TWICE PER DAY AS NEEDED 60 tablet 5   aspirin 81 MG tablet Take 1 tablet (81 mg total) by mouth daily. 100 tablet 99   bisoprolol (ZEBETA) 5 MG tablet TAKE 1/2 TABLET (2.5 MG TOTAL) BY MOUTH DAILY (Patient taking differently: Take 2.5 mg by mouth daily.) 45 tablet 3   Budeson-Glycopyrrol-Formoterol (BREZTRI AEROSPHERE) 160-9-4.8 MCG/ACT AERO Inhale 2 puffs into the lungs in the morning and at bedtime. 1 each 11   Budeson-Glycopyrrol-Formoterol (BREZTRI AEROSPHERE) 160-9-4.8 MCG/ACT AERO Inhale 2 puffs into the lungs in the morning and at bedtime.     digoxin (LANOXIN) 0.125 MG tablet TAKE 1/2 TABLET (62.5 MCG TOTAL) BY MOUTH DAILY  (Patient taking differently: Take 0.0625 mg by mouth daily.) 45 tablet 3   furosemide (LASIX) 20 MG tablet Take 2 tablets (40 mg total) by mouth 2 (two) times daily. 120 tablet 3   predniSONE (DELTASONE) 10 MG tablet Take 1 tablet (10 mg total) by mouth daily with breakfast. 14 tablet 0   predniSONE (DELTASONE) 10 MG tablet Take 1 tablet (10 mg total) by mouth daily with breakfast. 30 tablet 1   rosuvastatin (CRESTOR) 10 MG tablet TAKE 1 TABLET BY MOUTH DAILY 30 tablet 0   sacubitril-valsartan (ENTRESTO) 24-26 MG Take 1 tablet by mouth 2 (two) times daily. 60 tablet 11   spironolactone (ALDACTONE) 25 MG tablet Take 1 tablet (25 mg total) by mouth at bedtime. 30 tablet 3   tizanidine (ZANAFLEX) 2 MG capsule Take 2 mg by mouth as needed for muscle spasms.     No current facility-administered medications on file prior to visit.  Observations/Objective: Alert, NAD, appropriate mood and affect, resps normal, cn 2-12 intact, moves all 4s, no visible rash or swelling Lab Results  Component Value Date   WBC 9.8 03/01/2023   HGB 12.9 03/01/2023   HCT 39.2 03/01/2023   PLT 311 03/01/2023   GLUCOSE 129 (H) 03/01/2023   CHOL 200 02/26/2023   TRIG 80 02/26/2023   HDL 118 02/26/2023   LDLDIRECT 112.6 03/16/2013   LDLCALC 66 02/26/2023   ALT 22 03/01/2023   AST 23 03/01/2023   NA 135 03/01/2023   K 3.5 03/01/2023   CL 91 (L) 03/01/2023   CREATININE 1.16 (H) 03/01/2023   BUN 23 03/01/2023   CO2 31 03/01/2023   TSH 0.86 07/30/2022   INR 1.02 12/15/2016   HGBA1C 5.8 07/30/2022   MICROALBUR 4.5 (H) 07/30/2022   Assessment and Plan: See notes  Follow Up Instructions: See notes   I discussed the assessment and treatment plan with the patient. The patient was provided an opportunity to ask questions and all were answered. The patient agreed with the plan and demonstrated an understanding of the instructions.   The patient was advised to call back or seek an in-person evaluation if the  symptoms worsen or if the condition fails to improve as anticipated.   Oliver Barre, MD

## 2023-03-08 ENCOUNTER — Encounter: Payer: Self-pay | Admitting: Internal Medicine

## 2023-03-08 DIAGNOSIS — R911 Solitary pulmonary nodule: Secondary | ICD-10-CM | POA: Insufficient documentation

## 2023-03-08 DIAGNOSIS — S22060D Wedge compression fracture of T7-T8 vertebra, subsequent encounter for fracture with routine healing: Secondary | ICD-10-CM | POA: Insufficient documentation

## 2023-03-08 NOTE — Assessment & Plan Note (Signed)
Recent onset, severe pain for percocet refill, also HH with PT and aide, and has f/u appt with NS in [place] Dr Ernestine Mcmurray

## 2023-03-08 NOTE — Assessment & Plan Note (Signed)
Pt for f/u ROV in 3 mo - will need f/u CT chest wo cm

## 2023-03-08 NOTE — Assessment & Plan Note (Signed)
Improving overall, continue prednisone taper.

## 2023-03-08 NOTE — Assessment & Plan Note (Signed)
Stable overall, cont current med tx 

## 2023-03-08 NOTE — Assessment & Plan Note (Signed)
Also for DXA next visit as is due

## 2023-03-09 NOTE — Progress Notes (Unsigned)
Kimberly [name], female    DOB: 09-Mar-1945     MRN: [id number]   Brief patient profile:  25 yowf active smoker  GOLD III  referred to pulmonary clinic 09/05/2019 by Dr  Shirlee Latch for sob     History of Present Illness  09/05/2019  Pulmonary/ 1st office eval/Kimberly [name]  - no maint rx  Chief Complaint  Patient presents with   Follow-up    pt needs help to quit smoking.pt is wearing patches .pt can'y take chantix  Dyspnea:  Does [employer] s maint rx but struggles  Cough: typical for her takes 4-5 coughs with mucoid mucus and then  good to go  Sleep: sleeps bed flat/ one pillow on side  SABA use: one  5-6 x daily Rec Plan A = Automatic = Always=    Breztri Take 2 puffs first thing in am and then another 2 puffs about 12 hours later.  Work on inhaler technique: Plan B = Backup (to supplement plan A, not to replace it) Only use your albuterol inhaler as a rescue medication Prednisone 10 mg take  4 each am x 2 days,   2 each am x 2 days,  1 each am x 2 days and stop  The key is to stop smoking completely before smoking completely stops you! Add: needs alpha one AT screen next ov   01/25/2020  f/u ov/Kimberly [name] re: needs alpha one testing/ GOLD III copd / smoking  Chief Complaint  Patient presents with   Follow-up    Breathing is "ok"- she feels that the [place] helped but she ran out after 2 samples and copay to high. She is using her albuterol inhaler 3-4 x per wk.   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  I will call the drug rep for breztri to see what happened with your application > informed he is not able to do this due to company policy / HIPPA laws  Please schedule a follow up visit in 3 months but call sooner if needed    .07/27/2020  f/u ov/Kimberly [name] re:  GOLD III copd/ last smoked last week / maint on breztri   Chief Complaint  Patient presents with   Follow-up    Breathing is overall doing well. She is using her albuterol inhaler approx 2 x per day.    Dyspnea:  better doing cart at [employer] and [employer]/ no 02, more trouble with bending over  Cough: variable / no purulent sputum Sleeping: sometimes cough noct /bed is flat / one pillow  SABA use: 3 x daily and never pre challenges or re -challenges p saba 02: none  Rec Only use your albuterol as a rescue medication   Also ok to try albuterol 15 min before an activity (on alternating days)  that you know would make you short of breath The key  is to stop smoking completely before smoking completely stops you!   03/11/2023  f/u ov/Kimberly [name] re: GOLD 3 copd  quit smoking Nov 2024   maint  on breztri  and pred 10 mg one half daily  Chief Complaint  Patient presents with   Follow-up    SOB with exertion.  Dyspnea:  still pushing cart at [employer] s 02  Cough: minimal slt yellow esp in am  Sleeping: bed flat/ 2 pillows s  resp cc  SABA use: 1st thing in am and 3-4 x during the day (though hfa quite poor)  02: 2lpm   No obvious day to day or daytime variability or assoc   mucus plugs or hemoptysis or cp or chest tightness, subjective wheeze or overt sinus or hb symptoms.    Also denies any obvious fluctuation of symptoms with weather or environmental changes or other aggravating or alleviating factors except as outlined above   No unusual exposure hx or h/o childhood pna/ asthma or knowledge of premature birth.  Current Allergies, Complete Past Medical History, Past Surgical History, Family History, and Social History were reviewed in  [employer] record.  ROS  The following are not active complaints unless bolded Hoarseness, sore throat, dysphagia, dental problems, itching, sneezing,  nasal congestion or discharge of excess mucus or purulent secretions, ear ache,   fever, chills, sweats, unintended wt loss or wt gain, classically pleuritic or exertional cp,  orthopnea pnd or arm/hand swelling  or leg swelling, presyncope, palpitations, abdominal pain, anorexia, nausea, vomiting, diarrhea  or change in bowel habits or change in bladder habits, change in stools or change in urine, dysuria, hematuria,  rash, arthralgias/back pain  from vertebral compression fx , visual complaints, headache, numbness, weakness or ataxia or problems with walking or coordination,  change in mood or  memory.        Current Meds  Medication Sig   albuterol (PROVENTIL) (2.5 MG/3ML) 0.083% nebulizer solution Take 3 mLs (2.5 mg total) by nebulization every 6 (six) hours as needed for wheezing or shortness of breath. TAKE 3 MLS (1 VIAL) BY NEBULIZATION EVERY 6 HOURS AS NEEDED FOR WHEEZING OR SHORTNESS OF BREATH   albuterol (VENTOLIN HFA) 108 (90 Base) MCG/ACT inhaler Inhale 2 puffs into the lungs every 6 (six) hours as needed for wheezing or shortness of breath.   ALPRAZolam (XANAX) 0.5 MG tablet TAKE 1 TABLET BY MOUTH TWICE PER DAY AS NEEDED   aspirin 81 MG tablet Take 1 tablet (81 mg total) by mouth daily.   bisoprolol (ZEBETA) 5 MG tablet TAKE 1/2 TABLET (2.5 MG TOTAL) BY MOUTH DAILY (Patient taking differently: Take 2.5 mg by mouth daily.)   Budeson-Glycopyrrol-Formoterol (BREZTRI AEROSPHERE) 160-9-4.8 MCG/ACT AERO Inhale 2 puffs into the lungs in the morning and at bedtime.   Budeson-Glycopyrrol-Formoterol (BREZTRI AEROSPHERE) 160-9-4.8 MCG/ACT AERO Inhale 2 puffs into the lungs in the morning and at bedtime.   digoxin (LANOXIN) 0.125 MG tablet TAKE 1/2 TABLET (62.5 MCG TOTAL) BY MOUTH DAILY (Patient taking differently: Take 0.0625  mg by mouth daily.)   furosemide (LASIX) 20 MG tablet Take 2 tablets (40 mg total) by mouth 2 (two) times daily.   oxyCODONE-acetaminophen (PERCOCET) 5-325 MG tablet Take 1 tablet by mouth every 4 (four) hours as needed for severe pain (pain score 7-10).   predniSONE (DELTASONE) 10 MG tablet Take 1 tablet (10 mg total) by mouth daily with breakfast.   predniSONE (DELTASONE) 10 MG tablet Take 1 tablet (10 mg total) by  mouth daily with breakfast.   rosuvastatin (CRESTOR) 10 MG tablet TAKE 1 TABLET BY MOUTH DAILY   sacubitril-valsartan (ENTRESTO) 24-26 MG Take 1 tablet by mouth 2 (two) times daily.   spironolactone (ALDACTONE) 25 MG tablet Take 1 tablet (25 mg total) by mouth at bedtime.   tizanidine (ZANAFLEX) 2 MG capsule Take 2 mg by mouth as needed for muscle spasms.             Past Medical History:  Diagnosis Date   AICD (automatic cardioverter/defibrillator) present 03/17/2017   Anxiety    Atrial tachycardia (HCC)    Bursitis of shoulder, right, adhesive    CHF (congestive heart failure) (HCC)    Chronic bronchitis (HCC)    "1-2 times/yr" (01/23/2014)   COPD (chronic obstructive pulmonary disease) (HCC)    CVD (cerebrovascular disease)    Dyslipidemia    Dysrhythmia    Frequency of urination    GERD (gastroesophageal reflux disease)    Heart murmur    History of stomach ulcers    HTN (hypertension) 02/22/2011   Migraines    "stopped many years ago" (06/14/2014)   Osteoporosis 08/19/2016   Pericarditis    Pneumonia "10 times" (06/14/2014)   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)    Silent myocardial infarction (HCC) "late 1990's"   Stress incontinence    "was suppose to have been tacked up years ago but I didn't do it"   Syncope, near    Associated with atrial tachycardia-event recorder 1/16   Thoracic outlet syndrome    VSD (ventricular septal defect)        Objective:    Wts  03/11/2023       110  07/27/2020         106  01/25/20  105 lb (47.6 kg)  12/06/19 109 lb (49.4 kg)  12/05/19 106 lb (48.1 kg)    Vital signs reviewed  03/11/2023  - Note at rest 02 sats  92% on 2lpm cont  General appearance:    amb wf nad /using rollator/ back brace    HEENT :  Oropharynx  clear   Nasal turbinates nl    NECK :  without JVD/Nodes/TM/ nl carotid upstrokes bilaterally   LUNGS: no acc muscle use,  Mod barrel  contour chest wall with bilateral  Distant bs s audible wheeze and  without cough on insp or exp maneuvers and mod  Hyperresonant  to  percussion bilaterally     CV:  RRR  no s3 or murmur or increase in P2, and no edema   ABD:  soft and nontender    MS:   Ext warm without deformities or   obvious joint restrictions , calf tenderness, cyanosis or clubbing  SKIN: warm and dry without lesions    NEURO:  alert, approp, nl sensorium with  no motor or cerebellar deficits apparent.            Assessment      .

## 2023-03-11 ENCOUNTER — Ambulatory Visit: Payer: Medicare Other | Admitting: Internal Medicine

## 2023-03-11 ENCOUNTER — Encounter: Payer: Self-pay | Admitting: Internal Medicine

## 2023-03-11 VITALS — BP 108/70 | HR 63 | Temp 98.3°F | Ht 61.0 in | Wt 110.0 lb

## 2023-03-11 DIAGNOSIS — J441 Chronic obstructive pulmonary disease with (acute) exacerbation: Secondary | ICD-10-CM

## 2023-03-11 DIAGNOSIS — J9611 Chronic respiratory failure with hypoxia: Secondary | ICD-10-CM | POA: Diagnosis not present

## 2023-03-11 MED ORDER — BUDESONIDE 0.25 MG/2ML IN SUSP: RESPIRATORY_TRACT | 12 refills | Status: DC

## 2023-03-11 NOTE — Patient Instructions (Addendum)
Add albuterol /budesonide  first in am and 12 hours later  Continue Breztri Take 2 puffs second thing in am and then another 2 puffs about 12 hours later.   Also  Ok to try albuterol 15 min before an activity (on alternating days)  that you know would usually make you short of breath and see if it makes any difference and if makes none then don't take albuterol after activity unless you can't catch your breath as this means it's the resting that helps, not the albuterol.  After a week ok to try predisone 10 mg one half every other day   Make sure you check your oxygen saturation  AT  your highest level of activity (not after you stop)   to be sure it stays over 90% and adjust  02 flow upward to maintain this level if needed but remember to turn it back to previous settings when you stop (to conserve your supply).   Please schedule a follow up visit in 3 months but call sooner if needed

## 2023-03-11 NOTE — Assessment & Plan Note (Signed)
?   Quit smokng 07/2020  - PFT's  01/12/17   FEV1 0.88 (44 % ) ratio 0.51  p 16 % improvement from saba p ? prior to study with DLCO  9.68 (47%) corrects to 3.47 (78%)  for alv volume and FV curve slt atypical concave pattern  - 09/05/2019   try breztri  - Labs ordered 01/25/2020  :    alpha one AT phenotype  MS  Level 134  - 03/11/2023  After extensive coaching inhaler device,  effectiveness =    near 0 (very sort ti) > rec change to alb/bud 0.25 mg bid as add on to breztri maint    Group D (now reclassified as E) in terms of symptom/risk and laba/lama/ICS  therefore appropriate rx at this point >>>  ideally needs triple rx per neb but unlikely to afford it under present insurance   will try 1st every other day pred and neb budesonide as above

## 2023-03-11 NOTE — Assessment & Plan Note (Signed)
02 dep as of 03/11/2023 at 2lpm hs  - .Patient walked at slow pace on 2lpm cont  walked 1/4 of lap 1 with SaO2 down to 84%, Applied 5L POC pulsed oxygen. Was unable to get patients SaO2 above 90%. Notified Dr. Sherene Sires. Per Dr. Sherene Sires, order Best Fit for patient and do not order POC.   Advised will need continuous 02 at level not avail in POC  and :  Make sure you check your oxygen saturation  AT  your highest level of activity (not after you stop)   to be sure it stays over 90% and adjust  02 flow upward to maintain this level if needed but remember to turn it back to previous settings when you stop (to conserve your supply).   Each maintenance medication was reviewed in detail including emphasizing most importantly the difference between maintenance and prns and under what circumstances the prns are to be triggered using an action plan format where appropriate.  Total time for H and P, chart review, counseling, reviewing hfa/neb/02 /pulse ox  device(s) , directly observing portions of ambulatory 02 saturation study/ and generating customized AVS unique to this office visit / same day charting = 36 min

## 2023-03-11 NOTE — Progress Notes (Unsigned)
 Referring Physician:  No referring provider defined for this encounter.  Primary Physician:  Corwin Levins, MD  History of Present Illness: [name] has a history of repair of VSD x 2, HTN, PVD, dilated cardiomyopathy, chronic systolic CHF, severe COPD, GERD, hepatic cirrhosis, osteoporosis, dyslipidemia, ICD.   She is on 2L O2 prn.   She has an implantable cardioverter-defibrillator.   ED consulted Dr. Madaline Brilliant about this patient. She had severe coughing fit  on 02/27/23 and was found to have T7 compression fracture during ED visit on 03/01/23. CT also showed fracture of spinous process T6 and chronic endplate depression at T9. She was placed in TLSO for comfort and is here for follow up.   She is wearing her TLSO brace with improvement in her pain. She is not wearing it to bed. She has intermittent mid back pain with some radiation to left side. No pain into her ribs. No pain in her arms or legs. No numbness, tingling, or weakness. Pain is worse with walking and standing. Pain is better with rest.   She feels much better then she did in the hospital.   She was given percocet refill by her PCP. She is on chronic prednisone.   She quit smoking!  No bowel or bladder issues.   Past Surgery: none  The symptoms are causing a significant impact on the patient's life.   Review of Systems:  A 10 point review of systems is negative, except for the pertinent positives and negatives detailed in the HPI.  Past Medical History: Past Medical History:  Diagnosis Date   AICD (automatic cardioverter/defibrillator) present 03/17/2017   Anxiety    Atrial tachycardia (HCC)    Basal cell carcinoma    R ant thigh, L pretibia, both txted at [facility]   Bursitis of shoulder, right, adhesive    Cancer (HCC)    CHF (congestive heart failure) (HCC)    Chronic bronchitis (HCC)    "1-2 times/yr" (01/23/2014)   COPD (chronic obstructive pulmonary disease) (HCC)    CVD (cerebrovascular  disease)    Dyslipidemia    Dysrhythmia    Frequency of urination    GERD (gastroesophageal reflux disease)    Heart murmur    History of stomach ulcers    HTN (hypertension) 02/22/2011   Migraines    "stopped many years ago" (06/14/2014)   Osteoporosis 08/19/2016   Pericarditis    Pneumonia "10 times" (06/14/2014)   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)    Silent myocardial infarction (HCC) "late 1990's"   Stress incontinence    "was suppose to have been tacked up years ago but I didn't do it"   Syncope, near    Associated with atrial tachycardia-event recorder 1/16   Thoracic outlet syndrome    VSD (ventricular septal defect)     Past Surgical History: Past Surgical History:  Procedure Laterality Date   ABDOMINAL AORTOGRAM W/LOWER EXTREMITY N/A 12/17/2016   Procedure: ABDOMINAL AORTOGRAM W/LOWER EXTREMITY;  Surgeon: Iran Ouch, MD;  Location: [facility] INVASIVE CV LAB;  Service: Cardiovascular;  Laterality: N/A;   BIV ICD INSERTION CRT-D N/A 03/17/2017   Procedure: BIV ICD INSERTION CRT-D;  Surgeon: Marinus Maw, MD;  Location: [facility] INVASIVE CV LAB;  Service: Cardiovascular;  Laterality: N/A;   CARDIAC CATHETERIZATION  "quite a few"   CESAREAN SECTION  1972   CHOLECYSTECTOMY OPEN  1970's   CORONARY ANGIOGRAM  2000   No significant CAD   ELECTROPHYSIOLOGIC  STUDY N/A 06/14/2014   Procedure: A-Flutter/A-Tach/SVT Ablation;  Surgeon: Marinus Maw, MD;  Location: [facility] INVASIVE CV LAB;  Service: Cardiovascular;  Laterality: N/A;   INSERTION OF ICD  03/17/2017   BIV   MULTIPLE TOOTH EXTRACTIONS     MYRINGOTOMY WITH TUBE PLACEMENT Right 2015   PERIPHERAL VASCULAR INTERVENTION  12/17/2016   Procedure: PERIPHERAL VASCULAR INTERVENTION;  Surgeon: Iran Ouch, MD;  Location: [facility] INVASIVE CV LAB;  Service: Cardiovascular;;  Right common femoral PTA and Stent   RIGHT/LEFT HEART CATH AND CORONARY ANGIOGRAPHY Bilateral 11/06/2016   Procedure:  RIGHT/LEFT HEART CATH AND CORONARY ANGIOGRAPHY;  Surgeon: Iran Ouch, MD;  Location: [facility] INVASIVE CV LAB;  Service: Cardiovascular;  Laterality: Bilateral;   SVT ABLATION  06/14/2014   TUBAL LIGATION  1972   VSD REPAIR  1958; 1967    Allergies: Allergies as of 03/18/2023 - Review Complete 03/11/2023  Allergen Reaction Noted   Mupirocin Shortness Of Breath and Other (See Comments) 08/26/2016   Codeine Nausea Only 11/13/2008    Medications: Outpatient Encounter Medications as of 03/18/2023  Medication Sig   albuterol (PROVENTIL) (2.5 MG/3ML) 0.083% nebulizer solution Take 3 mLs (2.5 mg total) by nebulization every 6 (six) hours as needed for wheezing or shortness of breath. TAKE 3 MLS (1 VIAL) BY NEBULIZATION EVERY 6 HOURS AS NEEDED FOR WHEEZING OR SHORTNESS OF BREATH   albuterol (VENTOLIN HFA) 108 (90 Base) MCG/ACT inhaler Inhale 2 puffs into the lungs every 6 (six) hours as needed for wheezing or shortness of breath.   ALPRAZolam (XANAX) 0.5 MG tablet TAKE 1 TABLET BY MOUTH TWICE PER DAY AS NEEDED   aspirin 81 MG tablet Take 1 tablet (81 mg total) by mouth daily.   bisoprolol (ZEBETA) 5 MG tablet TAKE 1/2 TABLET (2.5 MG TOTAL) BY MOUTH DAILY (Patient taking differently: Take 2.5 mg by mouth daily.)   Budeson-Glycopyrrol-Formoterol (BREZTRI AEROSPHERE) 160-9-4.8 MCG/ACT AERO Inhale 2 puffs into the lungs in the morning and at bedtime.   Budeson-Glycopyrrol-Formoterol (BREZTRI AEROSPHERE) 160-9-4.8 MCG/ACT AERO Inhale 2 puffs into the lungs in the morning and at bedtime.   budesonide (PULMICORT) 0.25 MG/2ML nebulizer solution One vial twice daily thru nebulizer with albuterol   digoxin (LANOXIN) 0.125 MG tablet TAKE 1/2 TABLET (62.5 MCG TOTAL) BY MOUTH DAILY (Patient taking differently: Take 0.0625 mg by mouth daily.)   furosemide (LASIX) 20 MG tablet Take 2 tablets (40 mg total) by mouth 2 (two) times daily.   oxyCODONE-acetaminophen (PERCOCET) 5-325 MG tablet Take 1 tablet by mouth  every 4 (four) hours as needed for severe pain (pain score 7-10).   predniSONE (DELTASONE) 10 MG tablet Take 1 tablet (10 mg total) by mouth daily with breakfast.   predniSONE (DELTASONE) 10 MG tablet Take 1 tablet (10 mg total) by mouth daily with breakfast.   rosuvastatin (CRESTOR) 10 MG tablet TAKE 1 TABLET BY MOUTH DAILY   sacubitril-valsartan (ENTRESTO) 24-26 MG Take 1 tablet by mouth 2 (two) times daily.   spironolactone (ALDACTONE) 25 MG tablet Take 1 tablet (25 mg total) by mouth at bedtime.   tizanidine (ZANAFLEX) 2 MG capsule Take 2 mg by mouth as needed for muscle spasms.   No facility-administered encounter medications on file as of 03/18/2023.    Social History: Social History   Tobacco Use   Smoking status: Former    Current packs/day: 0.50    Average packs/day: 0.5 packs/day for 35.0 years (17.5 ttl pk-yrs)    Types: Cigarettes   Smokeless tobacco: Never  Tobacco comments:    3-4 cigarettes daily. Trying to quit. HS.  Quit smoking 4 months ago.  Sneaks cigarettes.  12/31/2022 hfb  Vaping Use   Vaping status: Never Used  Substance Use Topics   Alcohol use: Not Currently    Alcohol/week: 1.0 standard drink of alcohol    Types: 1 Glasses of wine per week    Comment: 06/14/2014 "glass of wine maybe once/month"   Drug use: No    Family Medical History: Family History  Problem Relation Age of Onset   Cancer Mother    Cancer Father    Throat cancer Other    Hypertension Other    Stroke Other    Alcohol abuse Other    Arthritis Other    Cancer Other        lung cancer   Hypertension Other    Arthritis Other    Stroke Other    Breast cancer Neg Hx     Physical Examination: There were no vitals filed for this visit.  General: Patient is well developed, well nourished, calm, collected, and in no apparent distress. Attention to examination is appropriate.  Respiratory: Patient is breathing without any difficulty.   NEUROLOGICAL:     Awake, alert, oriented  to person, place, and time.  Speech is clear and fluent. Fund of knowledge is appropriate.   Cranial Nerves: Pupils equal round and reactive to light.  Facial tone is symmetric.    Mild tenderness over T7 region.   No abnormal lesions on exposed skin.   Strength: Side Biceps Triceps Deltoid Interossei Grip Wrist Ext. Wrist Flex.  R 5 5 5 5 5 5 5   L 5 5 5 5 5 5 5    Side Iliopsoas Quads Hamstring PF DF EHL  R 5 5 5 5 5 5   L 5 5 5 5 5 5    Reflexes are 2+ and symmetric at the biceps, brachioradialis, patella and achilles.   Hoffman's is absent.  Clonus is not present.   Bilateral upper and lower extremity sensation is intact to light touch.     Gait is normal.     Medical Decision Making  Imaging: Thoracic xrays dated 03/18/23:  Mild progression of T7 compression fracture in comparison to previous thoracic CT scan.   Report not available yet for above xrays.    CT of thoracic spine dated 03/01/23:  FINDINGS: Limited cervical spine imaging: Cervicothoracic junction alignment is within normal limits.   Thoracic spine segmentation: 12 pairs of ribs, hypoplastic right 1st rib which does not reach the manubrium, sternum. But full size lowest ribs. No C7 cervical ribs are designated. Correlation with radiographs is recommended prior to any operative intervention.   Alignment: Exaggerated thoracic kyphosis since November related to T7 compression as below. No significant scoliosis or spondylolisthesis.   Vertebrae: Diffuse osteopenia. T7 compression fracture with 50% loss of vertebral body height since November. The compressed body is partially sclerotic. No retropulsion. T7 pedicles and posterior elements appear intact. New nondisplaced fracture of the T6 spinous process also (series 5, image 36).   Stable thoracic vertebral height elsewhere including mild chronic T9 endplate depression. Visible posterior ribs appear intact. No other acute osseous abnormality identified.    Paraspinal and other soft tissues: Chronic emphysema, Calcified aortic atherosclerosis., cardiac pacemaker. New right lower lobe mixed solid and sub solid lung nodule is 9 mm on series 3, image 88. No pleural effusion.   Aberrant right subclavian artery origin, normal variant. No evidence of pericardial  effusion. Negative visible noncontrast upper abdominal viscera.   Minimal paraspinal soft tissue edema or contusion at the T7 level.   Disc levels:   Mild for age thoracic spine degeneration. No CT evidence of thoracic spinal stenosis.   IMPRESSION: 1. Osteopenia with Moderate T7 compression fracture, new since November otherwise age indeterminate. Adjacent T6 nondisplaced spinous process fracture. 50% loss of height with no retropulsion or complicating features. If specific therapy such as vertebroplasty is desired, noncontrast MRI or Nuclear Medicine Whole-body Bone Scan would best determine acuity.   2. Right lower lobe part-solid pulmonary nodule measuring 9 mm is new since November. Emphysema (ICD10-J43.9). Per Fleischner Society Guidelines, recommend a non-contrast Chest CT at 3-6 months to confirm persistence. If unchanged and the solid component remains < 6 mm, an annual non-contrast Chest CT should be performed for 5 years. If the solid component is 6-8 mm on follow-up, recommend biopsy or resection. If the solid component is > 8 mm on follow-up, recommend PET/CT, biopsy or resection. These guidelines do not apply to immunocompromised patients and patients with cancer. Reference: Radiology. 2017; 284(1):228-43.   3.  Aortic Atherosclerosis (ICD10-I70.0).     Electronically Signed   By: Odessa Fleming M.D.   On: 03/01/2023 09:53  I have personally reviewed the images and agree with the above interpretation.  Assessment and Plan: Ms. Breitenstein had severe coughing fit  on 02/27/23 and was found to have T7 compression fracture during ED visit on 03/01/23.   She has  intermittent mid back pain with some radiation to left side. No pain into her ribs. No pain in her arms or legs. No numbness, tingling, or weakness. She is wearing her TLSO brace.   Xrays from today show some mild progression of T7 fracture in comparison to previous thoracic CT.   Treatment options discussed with patient and following plan made:   - She will continue with TLSO brace when she is up and walking. Does not need to wear when sleeping. Discussed total time in brace would likely be 3 months.  - No bending, twisting, or lifting.  - Continue on prn percocet from PCP. She can get refills from him or [place], but needs to be consistent.  - PCP discussed pulmonary nodule with patient seen on CT from ED- she will have f/u in 3 months. He is also planning to get DEXA when she is able to lay flat.  - Follow up with me in 4-6 weeks with repeat xrays.   Of note, her blood pressure was 83/29 initially and then was 81/42 on recheck. She states her blood pressure runs low. She denies any symptoms. Discussed with Dr. Katrinka Blazing and we recommend she go to emergency room for evaluation. She declines. I have sent a message to her PCP Oliver Barre MD) and cardiologist [name] MD) to let them know.   I spent a total of 35 minutes in face-to-face and non-face-to-face activities related to this patient's care today including review of outside records, review of imaging, review of symptoms, physical exam, discussion of differential diagnosis, discussion of treatment options, and documentation.   Thank you for involving me in the care of this patient.   Drake Leach PA-C Dept. of Neurosurgery

## 2023-03-14 ENCOUNTER — Other Ambulatory Visit: Payer: Self-pay | Admitting: Orthopedic Surgery

## 2023-03-14 DIAGNOSIS — S22060A Wedge compression fracture of T7-T8 vertebra, initial encounter for closed fracture: Secondary | ICD-10-CM

## 2023-03-16 ENCOUNTER — Telehealth: Payer: Self-pay | Admitting: Internal Medicine

## 2023-03-16 NOTE — Telephone Encounter (Signed)
 Per Elease Hashimoto with [employer], please update order to include the following verbiage:  "Please evaluate and titrate for best fit POC or portable O2 system. RT to evaluate and titrate patient for POC or Homefill with OCD maintain sats >/=90%. if patient qualifies, dispense POC or homefill with OCD 1-5 pulse dose."

## 2023-03-18 ENCOUNTER — Telehealth: Payer: Self-pay | Admitting: Orthopedic Surgery

## 2023-03-18 ENCOUNTER — Encounter: Payer: Self-pay | Admitting: Orthopedic Surgery

## 2023-03-18 ENCOUNTER — Ambulatory Visit
Admission: RE | Admit: 2023-03-18 | Discharge: 2023-03-18 | Disposition: A | Discharge status: Home / Self Care | Payer: Medicare Other | Source: Ambulatory Visit | Attending: Orthopedic Surgery | Admitting: Orthopedic Surgery

## 2023-03-18 ENCOUNTER — Ambulatory Visit (INDEPENDENT_AMBULATORY_CARE_PROVIDER_SITE_OTHER): Payer: Medicare Other

## 2023-03-18 ENCOUNTER — Telehealth: Payer: Self-pay | Admitting: Internal Medicine

## 2023-03-18 ENCOUNTER — Ambulatory Visit: Payer: Medicare Other | Admitting: Orthopedic Surgery

## 2023-03-18 VITALS — BP 81/42 | HR 60 | Ht 61.0 in | Wt 111.0 lb

## 2023-03-18 DIAGNOSIS — S22060A Wedge compression fracture of T7-T8 vertebra, initial encounter for closed fracture: Secondary | ICD-10-CM

## 2023-03-18 DIAGNOSIS — I420 Dilated cardiomyopathy: Secondary | ICD-10-CM

## 2023-03-18 DIAGNOSIS — M4854XA Collapsed vertebra, not elsewhere classified, thoracic region, initial encounter for fracture: Secondary | ICD-10-CM | POA: Diagnosis not present

## 2023-03-18 DIAGNOSIS — M8580 Other specified disorders of bone density and structure, unspecified site: Secondary | ICD-10-CM | POA: Diagnosis not present

## 2023-03-18 NOTE — Telephone Encounter (Signed)
 Unfortunately this is simply not true, and not as simple as they were told on the phone, which may have been to get them to go away by some customer service person  RULES state pt MUST be seen in my office within 90 days for me to be able to order any kind of home health  RULES are such that pt MUST have a mobility exam here in my office which involves over 10 pages of paperwork (which normally consists of forms provided by the patient for the purpose of obtaining the appropriate mobility thing like a scooter vs motorized wheelchair or something else such as from [facility] or other provider), as well as more paperwork and coordination with a physical therapist who does a mobility exam to prove this is needed also.    So very sorry, but "just send a prescription" for a scooter, and orders for home health cannot be done.

## 2023-03-18 NOTE — Telephone Encounter (Signed)
 Spoke to patient and she will call Dr. Shirlee Latch and see what they recommend for the BP.

## 2023-03-18 NOTE — Telephone Encounter (Signed)
 Order updated with requested information.

## 2023-03-18 NOTE — Patient Instructions (Addendum)
 It was so nice to see you today. Thank you so much for coming in.    You have a broken bone in your back at T7. This has progressed slightly since your CT scan. It should still heal without surgery.   Continue to wear your TLSO brace when you are up and walking. Do not wear while sleeping. You can remove if you are sitting and watching television. You will likely be in your brace for 2 more months.   No bending, twisting, or lifting.   Your blood pressure was low. I recommend that you go the the emergency room. I have also sent your PCP (Dr. Jonny Ruiz) a message.   I want to see you back in 6 weeks. You will need xrays prior to that visit.   Please do not hesitate to call if you have any questions or concerns. You can also message me in MyChart.   Drake Leach PA-C [phone]     The physicians and staff at [facility] at [facility] are committed to providing excellent care. You may receive a survey asking for feedback about your experience at our office. We value you your feedback and appreciate you taking the time to to fill it out. The [facility] leadership team is also available to discuss your experience in person, feel free to contact us [phone].

## 2023-03-18 NOTE — Telephone Encounter (Signed)
 Copied from CRM [id number]. Topic: Clinical - Prescription Issue >> Mar 18, 2023  8:19 AM Pascal Lux wrote: Reason for CRM: Patient called stated they spoke with their provider regarding a prescription for home health and scooter bike to get around and her insurance company stated they need a prescription.

## 2023-03-18 NOTE — Telephone Encounter (Signed)
 Please call and let her know I heard back from Dr. Jonny Ruiz about her blood pressure. He wants to see what cardiology recommends.   I sent Dr. Shirlee Latch a message (when I sent it to Dr. Jonny Ruiz) and have not heard back.   I recommend she call Dr. Alford Highland office and let them know about her low blood pressure. If she has any dizziness, feels lightheaded, or just doesn't feel well then she should go to ER.   Please let her know.   Thanks!

## 2023-03-19 ENCOUNTER — Telehealth: Payer: Self-pay | Admitting: Orthopedic Surgery

## 2023-03-19 LAB — CUP PACEART REMOTE DEVICE CHECK
Battery Remaining Longevity: 66 mo
Battery Remaining Percentage: 72 %
Brady Statistic RA Percent Paced: 52 %
Brady Statistic RV Percent Paced: 91 %
Date Time Interrogation Session: 20250224041100
HighPow Impedance: 85 Ohm
Lead Channel Impedance Value: 503 Ohm
Lead Channel Impedance Value: 545 Ohm
Lead Channel Impedance Value: 756 Ohm
Lead Channel Setting Pacing Amplitude: 2 V
Lead Channel Setting Pacing Amplitude: 2.5 V
Lead Channel Setting Pacing Amplitude: 2.6 V
Lead Channel Setting Pacing Pulse Width: 0.4 ms
Lead Channel Setting Pacing Pulse Width: 0.4 ms
Lead Channel Setting Sensing Sensitivity: 0.5 mV
Lead Channel Setting Sensing Sensitivity: 1 mV
Pulse Gen Serial Number: 204176
Zone Setting Status: 755011

## 2023-03-19 NOTE — Telephone Encounter (Signed)
-----   Message from [name] sent at 03/18/2023  9:31 PM EST ----- Her BP does run low.  As long as she is asymptomatic and creatinine is stable does not need to go to the ER. ----- Message ----- From: Drake Leach, PA-C Sent: 03/18/2023  11:07 AM EST To: Laurey Morale, MD; Corwin Levins, MD  Hi Dr. Jonny Ruiz and Dr. Shirlee Latch,   I am seeing [name] today for a follow up of her T7 compression fracture. Her BP was 83/29 and then 81/42. She states her BP is always low. She does not have any symptoms and is not in distress.   I have advised her to go to the ED. She has declined. I told her I would send both of you an update.   Thank you for your help!  Drake Leach PA-C [facility]

## 2023-03-22 ENCOUNTER — Encounter: Payer: Self-pay | Admitting: Internal Medicine

## 2023-03-24 ENCOUNTER — Other Ambulatory Visit: Payer: Self-pay | Admitting: Internal Medicine

## 2023-03-24 DIAGNOSIS — I493 Ventricular premature depolarization: Secondary | ICD-10-CM | POA: Diagnosis not present

## 2023-03-24 DIAGNOSIS — K219 Gastro-esophageal reflux disease without esophagitis: Secondary | ICD-10-CM | POA: Diagnosis not present

## 2023-03-24 DIAGNOSIS — G540 Brachial plexus disorders: Secondary | ICD-10-CM | POA: Diagnosis not present

## 2023-03-24 DIAGNOSIS — I110 Hypertensive heart disease with heart failure: Secondary | ICD-10-CM | POA: Diagnosis not present

## 2023-03-24 DIAGNOSIS — J4489 Other specified chronic obstructive pulmonary disease: Secondary | ICD-10-CM | POA: Diagnosis not present

## 2023-03-24 DIAGNOSIS — Z95810 Presence of automatic (implantable) cardiac defibrillator: Secondary | ICD-10-CM | POA: Diagnosis not present

## 2023-03-24 DIAGNOSIS — I252 Old myocardial infarction: Secondary | ICD-10-CM | POA: Diagnosis not present

## 2023-03-24 DIAGNOSIS — I4719 Other supraventricular tachycardia: Secondary | ICD-10-CM | POA: Diagnosis not present

## 2023-03-24 DIAGNOSIS — R350 Frequency of micturition: Secondary | ICD-10-CM | POA: Diagnosis not present

## 2023-03-24 DIAGNOSIS — Z79891 Long term (current) use of opiate analgesic: Secondary | ICD-10-CM | POA: Diagnosis not present

## 2023-03-24 DIAGNOSIS — Z7982 Long term (current) use of aspirin: Secondary | ICD-10-CM | POA: Diagnosis not present

## 2023-03-24 DIAGNOSIS — Z85828 Personal history of other malignant neoplasm of skin: Secondary | ICD-10-CM | POA: Diagnosis not present

## 2023-03-24 DIAGNOSIS — Z9049 Acquired absence of other specified parts of digestive tract: Secondary | ICD-10-CM | POA: Diagnosis not present

## 2023-03-24 DIAGNOSIS — Z7951 Long term (current) use of inhaled steroids: Secondary | ICD-10-CM | POA: Diagnosis not present

## 2023-03-24 DIAGNOSIS — I5022 Chronic systolic (congestive) heart failure: Secondary | ICD-10-CM | POA: Diagnosis not present

## 2023-03-24 DIAGNOSIS — M8008XD Age-related osteoporosis with current pathological fracture, vertebra(e), subsequent encounter for fracture with routine healing: Secondary | ICD-10-CM | POA: Diagnosis not present

## 2023-03-24 DIAGNOSIS — Q210 Ventricular septal defect: Secondary | ICD-10-CM | POA: Diagnosis not present

## 2023-03-24 DIAGNOSIS — E785 Hyperlipidemia, unspecified: Secondary | ICD-10-CM | POA: Diagnosis not present

## 2023-03-24 DIAGNOSIS — Z8673 Personal history of transient ischemic attack (TIA), and cerebral infarction without residual deficits: Secondary | ICD-10-CM | POA: Diagnosis not present

## 2023-03-24 DIAGNOSIS — Z7952 Long term (current) use of systemic steroids: Secondary | ICD-10-CM | POA: Diagnosis not present

## 2023-03-24 DIAGNOSIS — G43909 Migraine, unspecified, not intractable, without status migrainosus: Secondary | ICD-10-CM | POA: Diagnosis not present

## 2023-03-25 ENCOUNTER — Telehealth: Payer: Self-pay | Admitting: Cardiology

## 2023-03-25 MED ORDER — OXYCODONE-ACETAMINOPHEN 5-325 MG PO TABS: 1.0000 | ORAL_TABLET | Freq: Four times a day (QID) | ORAL | 0 refills | Status: DC | PRN

## 2023-03-25 NOTE — Telephone Encounter (Signed)
 Dr. Sherene Sires - will you please sign this order since it has been updated per [employer]'s request:  Eugenia Mcalpine, Nunzio Cobbs; Kathe Becton; Randie Heinz, Clovis Riley Order has not been signed since update. Thank you

## 2023-03-25 NOTE — Telephone Encounter (Signed)
 CM sent to [employer] stating order has been updated.

## 2023-03-25 NOTE — Telephone Encounter (Signed)
 Rec'd per [employer]:  Ambrose Mantle; Darcel Smalling Received, Thank you

## 2023-03-25 NOTE — Telephone Encounter (Signed)
 Pt confirmed appt for 03/26/23

## 2023-03-26 ENCOUNTER — Ambulatory Visit: Payer: Medicare Other | Admitting: Cardiology

## 2023-03-26 ENCOUNTER — Other Ambulatory Visit
Admission: RE | Admit: 2023-03-26 | Discharge: 2023-03-26 | Disposition: A | Discharge status: Home / Self Care | Source: Ambulatory Visit | Attending: Cardiology | Admitting: Cardiology

## 2023-03-26 ENCOUNTER — Encounter: Payer: Self-pay | Admitting: Cardiology

## 2023-03-26 VITALS — BP 94/54 | HR 61 | Wt 108.2 lb

## 2023-03-26 DIAGNOSIS — F419 Anxiety disorder, unspecified: Secondary | ICD-10-CM | POA: Insufficient documentation

## 2023-03-26 DIAGNOSIS — Z87891 Personal history of nicotine dependence: Secondary | ICD-10-CM | POA: Insufficient documentation

## 2023-03-26 DIAGNOSIS — J449 Chronic obstructive pulmonary disease, unspecified: Secondary | ICD-10-CM | POA: Diagnosis not present

## 2023-03-26 DIAGNOSIS — E785 Hyperlipidemia, unspecified: Secondary | ICD-10-CM | POA: Insufficient documentation

## 2023-03-26 DIAGNOSIS — Z7952 Long term (current) use of systemic steroids: Secondary | ICD-10-CM | POA: Diagnosis not present

## 2023-03-26 DIAGNOSIS — Z8774 Personal history of (corrected) congenital malformations of heart and circulatory system: Secondary | ICD-10-CM | POA: Insufficient documentation

## 2023-03-26 DIAGNOSIS — I428 Other cardiomyopathies: Secondary | ICD-10-CM | POA: Insufficient documentation

## 2023-03-26 DIAGNOSIS — I5022 Chronic systolic (congestive) heart failure: Secondary | ICD-10-CM | POA: Diagnosis not present

## 2023-03-26 DIAGNOSIS — I2510 Atherosclerotic heart disease of native coronary artery without angina pectoris: Secondary | ICD-10-CM | POA: Insufficient documentation

## 2023-03-26 DIAGNOSIS — I739 Peripheral vascular disease, unspecified: Secondary | ICD-10-CM | POA: Diagnosis not present

## 2023-03-26 DIAGNOSIS — Z7982 Long term (current) use of aspirin: Secondary | ICD-10-CM | POA: Diagnosis not present

## 2023-03-26 DIAGNOSIS — Z79899 Other long term (current) drug therapy: Secondary | ICD-10-CM | POA: Diagnosis not present

## 2023-03-26 DIAGNOSIS — Z95810 Presence of automatic (implantable) cardiac defibrillator: Secondary | ICD-10-CM | POA: Insufficient documentation

## 2023-03-26 LAB — BASIC METABOLIC PANEL
Anion gap: 11 (ref 5–15)
BUN: 26 mg/dL — ABNORMAL HIGH (ref 8–23)
CO2: 25 mmol/L (ref 22–32)
Calcium: 9.4 mg/dL (ref 8.9–10.3)
Chloride: 98 mmol/L (ref 98–111)
Creatinine, Ser: 1.33 mg/dL — ABNORMAL HIGH (ref 0.44–1.00)
GFR, Estimated: 41 mL/min — ABNORMAL LOW (ref >60)
Glucose, Bld: 107 mg/dL — ABNORMAL HIGH (ref 70–99)
Potassium: 5.1 mmol/L (ref 3.5–5.1)
Sodium: 134 mmol/L — ABNORMAL LOW (ref 135–145)

## 2023-03-26 LAB — DIGOXIN LEVEL: Digoxin Level: 0.6 ng/mL — ABNORMAL LOW (ref 0.8–2.0)

## 2023-03-26 NOTE — Progress Notes (Signed)
 PCP: Dr. Jonny Ruiz Cardiology: Dr. Tenny Craw HF Cardiology: Dr. Shirlee Latch  Chief complaint: CHF  78 y.o.with history of VSD repair as a child, COPD/active smoking, PAD, and chronic systolic CHF.  Patient has a long history of cardiomyopathy.  She had VSD repaired as a child and imaging has not showed residual VSD.  Cardiac MRI in 11/09 showed EF 36%.  Echo in 12/15 showed EF 40-45%.  Echo in 3/18 showed EF down to 15-20% with moderate RV systolic dysfunction.  RHC/LHC in 10/18 showed nonobstructive coronary but was concerning for low output (CI 1.33).  She had a nonhealing right lower extremity wound.  Peripheral angiogram with bilateral CFA occlusions and underwent DCB/self expanding stent to to right CFA.    CPX in 1/19 showed moderate to severe functional limitation, combination of HF and lung disease, probably more related to the lung disease.  PFTs in 12/18 showed severe COPD.   She had a Environmental manager CRT-D device placed.    7/19 peripheral arterial dopplers showed significant in-stent restenosis right CFA stent.  Echo in 4/21 showed EF 35-40% with mild AI.  CPX in 5/21 showed severe COPD, mild-moderate HF limitation.  11/21 Cardiolite showed prior septal and apical infarction, no ischemia.   Had COVID April 2022.   Echo in 9/22 showed EF 35-40% with basal-mid septal akinesis and anterior hypokinesis, no evidence for residual VSD, mildly decreased RV systolic function.  Echo in 11/23 showed EF 40% with basal to mid anteroseptal and inferoseptal akinesis, mild RV dysfunction, PASP 33 mmHg.  Echo in 9/24 showed EF 40%, basal-mid inferoseptal and anteroseptal akinesis, normal RV.   She returns for followup of CHF. She is staying off cigarettes.  She has a lot of anxiety. She continues on prednisone for COPD/wheezing, sees pulmonary.  She now has a compression fracture with significant back pain.  She notes dyspnea when she "gets anxious" or moves fast.  No orthopnea/PND.  No chest pain.  Weight is  down 5 lbs on increased Lasix.   [name] device interrogation: HeartLogic 9  ECG (Personally reviewed): NSR, BiV pacing   Labs (12/22): BNP 418, digoxin 0.5, K 5, creatinine 1.17 Labs (8/23): BNP 222, digoxin 0.7, LDL 68, K 4.4, creatinine 7.32 Labs (10/23): K 3.7, creatinine 0.96 Labs (4/24): hgb 14.1, K 4.2, creatinine 1.14, digoxin level 0.6 Labs (7/24): K 4.9, creatinine 1.05, hgb 13.6, LDL 68 Labs (8/24): digoxin 0.9, BNP 347 Labs (1/25): K 3.9, creatinine 1.27, BNP 263 Labs (2/25): LDL 66  PMH: 1. VSD: s/p repair at [facility] as child.  From description, sounds like muscular VSD.  2. Atrial tachycardia s/p ablation.  3. Hyperlipidemia 4. COPD: Quit smoking 2024 - PFTs (12/18): Severe obstructive lung disease.  5. Atrial fibrillation: Paroxysmal.   Noted only transiently.   6. PAD:  - Angiogram 11/18 showed totally occluded bilateral CFAs.  Patient had stent to right CFA.  ABIs in 12/18 were normal on right. - ABIs (10/20): ABI 0.8 right, 0.74 left - 12/21 dopplers with R CFA stent in-stenosis.  - ABIs (1/24): normal on right, 0.68 on left with occluded left CFA.  7. Chronic systolic CHF: Nonischemic cardiomyopathy.   - cMRI (11/09): EF 36%, VSD patch in anterior ventricular septum - Echo (12/15): EF 40-45% - Echo (3/18): EF 15-20%, moderate LV dilation, moderate MR, moderate RV dilation with moderately decreased systolic function.  - LHC/RHC (10/18): 3+ MR, EF < 25%, minimal nonobstructive CAD; PA 41/21, LVEDP 18, CI 1.33.  - CPX (1/19): peak VO2  13.4, VE/VCO2 slope 43, RER 1.07.  Mod-severe functional limitation due to combination of HF and lung disease, probably more related to lung disease.  - [name] CRT-D device.  - Echo (7/19): EF 25-30%, mild LV dilation, mild MR, normal RV size and systolic function.  - Echo (4/21): EF 35-40%, mild AI - CPX (5/21): severe COPD, mild-moderate HF limitation.  - Cardiolite (11/21): EF 47%, prior septal and apical  infarction, no ischemia. - Echo (9/22): EF 35-40% with basal-mid septal akinesis and anterior hypokinesis, no evidence for residual VSD, mildly decreased RV systolic function.  - Echo (11/23): EF 40% with basal to mid anteroseptal and inferoseptal akinesis, mild RV dysfunction, PASP 33 mmHg.  - Echo (9/24): EF 40%, basal-mid inferoseptal and anteroseptal akinesis, normal RV.  8. PVCs: Zio patch 9/19 with 6% PVCs.  9. COVID-19 (4/22)  SH: Smoker, lives in [place], married.  FH: Mother with PAD, sister died at birth from congenital heart disease.   ROS: All systems reviewed and negative except as per HPI.   Current Outpatient Medications  Medication Sig Dispense Refill   albuterol (PROVENTIL) (2.5 MG/3ML) 0.083% nebulizer solution Take 3 mLs (2.5 mg total) by nebulization every 6 (six) hours as needed for wheezing or shortness of breath. TAKE 3 MLS (1 VIAL) BY NEBULIZATION EVERY 6 HOURS AS NEEDED FOR WHEEZING OR SHORTNESS OF BREATH 90 mL 5   albuterol (VENTOLIN HFA) 108 (90 Base) MCG/ACT inhaler Inhale 2 puffs into the lungs every 6 (six) hours as needed for wheezing or shortness of breath. 8.5 g 5   ALPRAZolam (XANAX) 0.5 MG tablet TAKE 1 TABLET BY MOUTH TWICE PER DAY AS NEEDED 60 tablet 5   aspirin 81 MG tablet Take 1 tablet (81 mg total) by mouth daily. 100 tablet 99   bisoprolol (ZEBETA) 5 MG tablet TAKE 1/2 TABLET (2.5 MG TOTAL) BY MOUTH DAILY 45 tablet 3   Budeson-Glycopyrrol-Formoterol (BREZTRI AEROSPHERE) 160-9-4.8 MCG/ACT AERO Inhale 2 puffs into the lungs in the morning and at bedtime. 1 each 11   Budeson-Glycopyrrol-Formoterol (BREZTRI AEROSPHERE) 160-9-4.8 MCG/ACT AERO Inhale 2 puffs into the lungs in the morning and at bedtime.     budesonide (PULMICORT) 0.25 MG/2ML nebulizer solution One vial twice daily thru nebulizer with albuterol 60 mL 12   digoxin (LANOXIN) 0.125 MG tablet TAKE 1/2 TABLET (62.5 MCG TOTAL) BY MOUTH DAILY 45 tablet 3   furosemide (LASIX) 20 MG tablet Take 2  tablets (40 mg total) by mouth 2 (two) times daily. 120 tablet 3   oxyCODONE-acetaminophen (PERCOCET) 5-325 MG tablet Take 1 tablet by mouth every 6 (six) hours as needed for severe pain (pain score 7-10). 30 tablet 0   predniSONE (DELTASONE) 10 MG tablet Take 1 tablet (10 mg total) by mouth daily with breakfast. (Patient taking differently: Take 10 mg by mouth every other day.) 30 tablet 1   rosuvastatin (CRESTOR) 10 MG tablet TAKE 1 TABLET BY MOUTH DAILY 30 tablet 0   sacubitril-valsartan (ENTRESTO) 24-26 MG Take 1 tablet by mouth 2 (two) times daily. 60 tablet 11   spironolactone (ALDACTONE) 25 MG tablet Take 1 tablet (25 mg total) by mouth at bedtime. 30 tablet 3   tizanidine (ZANAFLEX) 2 MG capsule Take 2 mg by mouth as needed for muscle spasms.     No current facility-administered medications for this visit.   BP (!) 94/54   Pulse 61   Wt 108 lb 3.2 oz (49.1 kg)   SpO2 95%   PF (!) 2 L/min  BMI 20.44 kg/m   Wt Readings from Last 3 Encounters:  03/26/23 108 lb 3.2 oz (49.1 kg)  03/18/23 111 lb (50.3 kg)  03/11/23 110 lb (49.9 kg)   General: NAD Neck: No JVD, no thyromegaly or thyroid nodule.  Lungs: Distant BS CV: Nondisplaced PMI.  Heart regular S1/S2, no S3/S4, no murmur.  No peripheral edema.  No carotid bruit.  Normal pedal pulses.  Abdomen: Soft, nontender, no hepatosplenomegaly, no distention.  Skin: Intact without lesions or rashes.  Neurologic: Alert and oriented x 3.  Psych: Normal affect. Extremities: No clubbing or cyanosis.  HEENT: Normal.   Assessment/Plan: 1.  PAD: She had endovascular intervention to occluded right CFA in 11/18, but subsequent arterial showed in-stent restenosis. She has occluded left CFA also that was not intervened on. Minimal claudication.  She has quit smoking.  Follows with Dr. Kirke Corin for PV with plan for medical management unless claudication worsens.  - Continue statin.  2. CAD: Nonobstructive on 10/18 cath. Cardiolite in 11/21 with no  evidence of ischemia. No recent chest pain.  - She is on ASA 81 and statin.   3. Hyperlipidemia: Continue crestor, LDL acceptable in 2/25.  4. COPD: Severe by PFTs on 5/21 CPX. COPD plays a significant role in her dyspnea. She has finally quit smoking.  Follows with pulmonary.  She is now on chronic prednisone.   5. Chronic systolic CHF: Nonischemic cardiomyopathy.  RHC/LHC in 10/18 with no nonobstructive coronary disease but CI was very low at 1.33.  Her low cardiac output was out of proportion to her symptoms.  [name] CRT-D device.  CPX in 5/21 showed severe COPD but only mild-moderate HF limitation.   Echo with EF stable at 35-40% in 9/22 and EF 40% on 11/23 echo.  Echo in 9/24 was stable with EF 40%, basal-mid inferoseptal and anteroseptal akinesis, normal RV. NYHA class II-III, symptoms confounded by severe COPD.  She is not volume overloaded by exam or HeartLogic today.  - Continue Lasix 40 mg bid. BMET/BNP today.  - Continue Entresto 24/26 bid. No BP room to increase.  - Continue bisoprolol 2.5 mg daily. No BP room to increase.  - Continue spironolactone 25 mg daily.  - Continue digoxin, check level today.  - Unable to tolerate [place].   Followup 3 months.   I spent 22 minutes reviewing records, interviewing/examining patient, and managing orders.   Kimberly [name] 03/26/2023

## 2023-03-26 NOTE — Patient Instructions (Signed)
 There has been no changes to your medications.   Go DOWN to LOWER LEVEL (LL) to have your blood work completed inside of [employer] office.  We will only call you if the results are abnormal or if the provider would like to make medication changes.  Your physician recommends that you schedule a follow-up appointment in: 3 months (June) ** PLEASE CALL THE OFFICE IN APRIL TO ARRANGE YOUR FOLLOW UP APPOINTMENT.**  At the [facility], you and your health needs are our priority. As part of our continuing mission to provide you with exceptional heart care, we have created designated Provider Care Teams. These Care Teams include your primary Cardiologist (physician) and Advanced Practice Providers (APPs- Physician Assistants and Nurse Practitioners) who all work together to provide you with the care you need, when you need it.   You may see any of the following providers on your designated Care Team at your next follow up: Dr Arvilla Meres Dr [name] Dr. Dorthula Nettles Dr. Clearnce Hasten Amy Filbert Schilder, NP Robbie Lis, [place] [facility] [place], [place] Brynda Peon, NP [place] Lee, NP Clarisa Kindred, NP Karle Plumber, PharmD Enos Fling, PharmD   Please be sure to bring in all your medications bottles to every appointment.    Thank you for choosing [facility]

## 2023-03-27 ENCOUNTER — Telehealth: Payer: Self-pay

## 2023-03-27 ENCOUNTER — Other Ambulatory Visit: Payer: Self-pay | Admitting: Cardiovascular Disease

## 2023-03-27 DIAGNOSIS — G43909 Migraine, unspecified, not intractable, without status migrainosus: Secondary | ICD-10-CM | POA: Diagnosis not present

## 2023-03-27 DIAGNOSIS — R350 Frequency of micturition: Secondary | ICD-10-CM | POA: Diagnosis not present

## 2023-03-27 DIAGNOSIS — Q210 Ventricular septal defect: Secondary | ICD-10-CM | POA: Diagnosis not present

## 2023-03-27 DIAGNOSIS — I493 Ventricular premature depolarization: Secondary | ICD-10-CM | POA: Diagnosis not present

## 2023-03-27 DIAGNOSIS — Z9049 Acquired absence of other specified parts of digestive tract: Secondary | ICD-10-CM | POA: Diagnosis not present

## 2023-03-27 DIAGNOSIS — Z85828 Personal history of other malignant neoplasm of skin: Secondary | ICD-10-CM | POA: Diagnosis not present

## 2023-03-27 DIAGNOSIS — J4489 Other specified chronic obstructive pulmonary disease: Secondary | ICD-10-CM | POA: Diagnosis not present

## 2023-03-27 DIAGNOSIS — I4719 Other supraventricular tachycardia: Secondary | ICD-10-CM | POA: Diagnosis not present

## 2023-03-27 DIAGNOSIS — Z79891 Long term (current) use of opiate analgesic: Secondary | ICD-10-CM | POA: Diagnosis not present

## 2023-03-27 DIAGNOSIS — Z8673 Personal history of transient ischemic attack (TIA), and cerebral infarction without residual deficits: Secondary | ICD-10-CM | POA: Diagnosis not present

## 2023-03-27 DIAGNOSIS — I110 Hypertensive heart disease with heart failure: Secondary | ICD-10-CM | POA: Diagnosis not present

## 2023-03-27 DIAGNOSIS — I252 Old myocardial infarction: Secondary | ICD-10-CM | POA: Diagnosis not present

## 2023-03-27 DIAGNOSIS — M8008XD Age-related osteoporosis with current pathological fracture, vertebra(e), subsequent encounter for fracture with routine healing: Secondary | ICD-10-CM | POA: Diagnosis not present

## 2023-03-27 DIAGNOSIS — I5022 Chronic systolic (congestive) heart failure: Secondary | ICD-10-CM | POA: Diagnosis not present

## 2023-03-27 DIAGNOSIS — Z95810 Presence of automatic (implantable) cardiac defibrillator: Secondary | ICD-10-CM | POA: Diagnosis not present

## 2023-03-27 DIAGNOSIS — Z7982 Long term (current) use of aspirin: Secondary | ICD-10-CM | POA: Diagnosis not present

## 2023-03-27 DIAGNOSIS — K219 Gastro-esophageal reflux disease without esophagitis: Secondary | ICD-10-CM | POA: Diagnosis not present

## 2023-03-27 DIAGNOSIS — Z7952 Long term (current) use of systemic steroids: Secondary | ICD-10-CM | POA: Diagnosis not present

## 2023-03-27 DIAGNOSIS — Z7951 Long term (current) use of inhaled steroids: Secondary | ICD-10-CM | POA: Diagnosis not present

## 2023-03-27 DIAGNOSIS — G540 Brachial plexus disorders: Secondary | ICD-10-CM | POA: Diagnosis not present

## 2023-03-27 DIAGNOSIS — E785 Hyperlipidemia, unspecified: Secondary | ICD-10-CM | POA: Diagnosis not present

## 2023-03-27 NOTE — Telephone Encounter (Signed)
 Copied from CRM [id number]. Topic: General - Other >> Mar 27, 2023 10:56 AM Almira Coaster wrote: Reason for CRM: Patient is calling to see if Dr.John would be able to place an order for an electric scooter, Patient is unable to walk and when going grocery shopping at times they don't have the electric cart available. Home health agent advised that order needed to come from Dr.John

## 2023-03-27 NOTE — Telephone Encounter (Signed)
 Copied from CRM [id number]. Topic: Clinical - Home Health Verbal Orders >> Mar 27, 2023  4:25 PM Isabell A wrote: Caller/Agency: Judeth Cornfield with [facility]  Callback Number: [phone] Service Requested: Occupational Therapy Frequency: 1xweek for 2 weeks.  Any new concerns about the patient? No, secure voicemail to leave a message.

## 2023-03-30 NOTE — Telephone Encounter (Signed)
 Ok for [employer]

## 2023-03-31 ENCOUNTER — Ambulatory Visit: Payer: Medicare Other | Admitting: Student

## 2023-03-31 NOTE — Telephone Encounter (Signed)
 Called and gave verbals.

## 2023-03-31 NOTE — Telephone Encounter (Signed)
 Called and let Pt know what was stated from Dr.John in the previous phone note and she does not want to have an mobility exam so she says forget about it.

## 2023-04-02 DIAGNOSIS — Q210 Ventricular septal defect: Secondary | ICD-10-CM | POA: Diagnosis not present

## 2023-04-02 DIAGNOSIS — I493 Ventricular premature depolarization: Secondary | ICD-10-CM | POA: Diagnosis not present

## 2023-04-02 DIAGNOSIS — Z7952 Long term (current) use of systemic steroids: Secondary | ICD-10-CM | POA: Diagnosis not present

## 2023-04-02 DIAGNOSIS — Z9049 Acquired absence of other specified parts of digestive tract: Secondary | ICD-10-CM | POA: Diagnosis not present

## 2023-04-02 DIAGNOSIS — G43909 Migraine, unspecified, not intractable, without status migrainosus: Secondary | ICD-10-CM | POA: Diagnosis not present

## 2023-04-02 DIAGNOSIS — Z8673 Personal history of transient ischemic attack (TIA), and cerebral infarction without residual deficits: Secondary | ICD-10-CM | POA: Diagnosis not present

## 2023-04-02 DIAGNOSIS — I110 Hypertensive heart disease with heart failure: Secondary | ICD-10-CM | POA: Diagnosis not present

## 2023-04-02 DIAGNOSIS — E785 Hyperlipidemia, unspecified: Secondary | ICD-10-CM | POA: Diagnosis not present

## 2023-04-02 DIAGNOSIS — I252 Old myocardial infarction: Secondary | ICD-10-CM | POA: Diagnosis not present

## 2023-04-02 DIAGNOSIS — G540 Brachial plexus disorders: Secondary | ICD-10-CM | POA: Diagnosis not present

## 2023-04-02 DIAGNOSIS — Z79891 Long term (current) use of opiate analgesic: Secondary | ICD-10-CM | POA: Diagnosis not present

## 2023-04-02 DIAGNOSIS — I4719 Other supraventricular tachycardia: Secondary | ICD-10-CM | POA: Diagnosis not present

## 2023-04-02 DIAGNOSIS — R350 Frequency of micturition: Secondary | ICD-10-CM | POA: Diagnosis not present

## 2023-04-02 DIAGNOSIS — Z85828 Personal history of other malignant neoplasm of skin: Secondary | ICD-10-CM | POA: Diagnosis not present

## 2023-04-02 DIAGNOSIS — M8008XD Age-related osteoporosis with current pathological fracture, vertebra(e), subsequent encounter for fracture with routine healing: Secondary | ICD-10-CM | POA: Diagnosis not present

## 2023-04-02 DIAGNOSIS — Z95810 Presence of automatic (implantable) cardiac defibrillator: Secondary | ICD-10-CM | POA: Diagnosis not present

## 2023-04-02 DIAGNOSIS — K219 Gastro-esophageal reflux disease without esophagitis: Secondary | ICD-10-CM | POA: Diagnosis not present

## 2023-04-02 DIAGNOSIS — Z7951 Long term (current) use of inhaled steroids: Secondary | ICD-10-CM | POA: Diagnosis not present

## 2023-04-02 DIAGNOSIS — J4489 Other specified chronic obstructive pulmonary disease: Secondary | ICD-10-CM | POA: Diagnosis not present

## 2023-04-02 DIAGNOSIS — I5022 Chronic systolic (congestive) heart failure: Secondary | ICD-10-CM | POA: Diagnosis not present

## 2023-04-02 DIAGNOSIS — Z7982 Long term (current) use of aspirin: Secondary | ICD-10-CM | POA: Diagnosis not present

## 2023-04-09 DIAGNOSIS — G540 Brachial plexus disorders: Secondary | ICD-10-CM | POA: Diagnosis not present

## 2023-04-09 DIAGNOSIS — I252 Old myocardial infarction: Secondary | ICD-10-CM | POA: Diagnosis not present

## 2023-04-09 DIAGNOSIS — I493 Ventricular premature depolarization: Secondary | ICD-10-CM | POA: Diagnosis not present

## 2023-04-09 DIAGNOSIS — Z7952 Long term (current) use of systemic steroids: Secondary | ICD-10-CM | POA: Diagnosis not present

## 2023-04-09 DIAGNOSIS — Z79891 Long term (current) use of opiate analgesic: Secondary | ICD-10-CM | POA: Diagnosis not present

## 2023-04-09 DIAGNOSIS — Z7982 Long term (current) use of aspirin: Secondary | ICD-10-CM | POA: Diagnosis not present

## 2023-04-09 DIAGNOSIS — Z95810 Presence of automatic (implantable) cardiac defibrillator: Secondary | ICD-10-CM | POA: Diagnosis not present

## 2023-04-09 DIAGNOSIS — I5022 Chronic systolic (congestive) heart failure: Secondary | ICD-10-CM | POA: Diagnosis not present

## 2023-04-09 DIAGNOSIS — Z9049 Acquired absence of other specified parts of digestive tract: Secondary | ICD-10-CM | POA: Diagnosis not present

## 2023-04-09 DIAGNOSIS — G43909 Migraine, unspecified, not intractable, without status migrainosus: Secondary | ICD-10-CM | POA: Diagnosis not present

## 2023-04-09 DIAGNOSIS — Q210 Ventricular septal defect: Secondary | ICD-10-CM | POA: Diagnosis not present

## 2023-04-09 DIAGNOSIS — E785 Hyperlipidemia, unspecified: Secondary | ICD-10-CM | POA: Diagnosis not present

## 2023-04-09 DIAGNOSIS — J4489 Other specified chronic obstructive pulmonary disease: Secondary | ICD-10-CM | POA: Diagnosis not present

## 2023-04-09 DIAGNOSIS — K219 Gastro-esophageal reflux disease without esophagitis: Secondary | ICD-10-CM | POA: Diagnosis not present

## 2023-04-09 DIAGNOSIS — Z8673 Personal history of transient ischemic attack (TIA), and cerebral infarction without residual deficits: Secondary | ICD-10-CM | POA: Diagnosis not present

## 2023-04-09 DIAGNOSIS — M8008XD Age-related osteoporosis with current pathological fracture, vertebra(e), subsequent encounter for fracture with routine healing: Secondary | ICD-10-CM | POA: Diagnosis not present

## 2023-04-09 DIAGNOSIS — R350 Frequency of micturition: Secondary | ICD-10-CM | POA: Diagnosis not present

## 2023-04-09 DIAGNOSIS — Z7951 Long term (current) use of inhaled steroids: Secondary | ICD-10-CM | POA: Diagnosis not present

## 2023-04-09 DIAGNOSIS — I110 Hypertensive heart disease with heart failure: Secondary | ICD-10-CM | POA: Diagnosis not present

## 2023-04-09 DIAGNOSIS — Z85828 Personal history of other malignant neoplasm of skin: Secondary | ICD-10-CM | POA: Diagnosis not present

## 2023-04-09 DIAGNOSIS — I4719 Other supraventricular tachycardia: Secondary | ICD-10-CM | POA: Diagnosis not present

## 2023-04-14 ENCOUNTER — Other Ambulatory Visit: Payer: Self-pay | Admitting: Internal Medicine

## 2023-04-14 DIAGNOSIS — I4719 Other supraventricular tachycardia: Secondary | ICD-10-CM | POA: Diagnosis not present

## 2023-04-14 DIAGNOSIS — R350 Frequency of micturition: Secondary | ICD-10-CM | POA: Diagnosis not present

## 2023-04-14 DIAGNOSIS — K219 Gastro-esophageal reflux disease without esophagitis: Secondary | ICD-10-CM | POA: Diagnosis not present

## 2023-04-14 DIAGNOSIS — Z7952 Long term (current) use of systemic steroids: Secondary | ICD-10-CM | POA: Diagnosis not present

## 2023-04-14 DIAGNOSIS — Z7982 Long term (current) use of aspirin: Secondary | ICD-10-CM | POA: Diagnosis not present

## 2023-04-14 DIAGNOSIS — Z85828 Personal history of other malignant neoplasm of skin: Secondary | ICD-10-CM | POA: Diagnosis not present

## 2023-04-14 DIAGNOSIS — E785 Hyperlipidemia, unspecified: Secondary | ICD-10-CM | POA: Diagnosis not present

## 2023-04-14 DIAGNOSIS — I252 Old myocardial infarction: Secondary | ICD-10-CM | POA: Diagnosis not present

## 2023-04-14 DIAGNOSIS — Z95810 Presence of automatic (implantable) cardiac defibrillator: Secondary | ICD-10-CM | POA: Diagnosis not present

## 2023-04-14 DIAGNOSIS — J4489 Other specified chronic obstructive pulmonary disease: Secondary | ICD-10-CM | POA: Diagnosis not present

## 2023-04-14 DIAGNOSIS — Z79891 Long term (current) use of opiate analgesic: Secondary | ICD-10-CM | POA: Diagnosis not present

## 2023-04-14 DIAGNOSIS — Q210 Ventricular septal defect: Secondary | ICD-10-CM | POA: Diagnosis not present

## 2023-04-14 DIAGNOSIS — G43909 Migraine, unspecified, not intractable, without status migrainosus: Secondary | ICD-10-CM | POA: Diagnosis not present

## 2023-04-14 DIAGNOSIS — Z8673 Personal history of transient ischemic attack (TIA), and cerebral infarction without residual deficits: Secondary | ICD-10-CM | POA: Diagnosis not present

## 2023-04-14 DIAGNOSIS — I5022 Chronic systolic (congestive) heart failure: Secondary | ICD-10-CM | POA: Diagnosis not present

## 2023-04-14 DIAGNOSIS — G540 Brachial plexus disorders: Secondary | ICD-10-CM | POA: Diagnosis not present

## 2023-04-14 DIAGNOSIS — I110 Hypertensive heart disease with heart failure: Secondary | ICD-10-CM | POA: Diagnosis not present

## 2023-04-14 DIAGNOSIS — I493 Ventricular premature depolarization: Secondary | ICD-10-CM | POA: Diagnosis not present

## 2023-04-14 DIAGNOSIS — Z7951 Long term (current) use of inhaled steroids: Secondary | ICD-10-CM | POA: Diagnosis not present

## 2023-04-14 DIAGNOSIS — M8008XD Age-related osteoporosis with current pathological fracture, vertebra(e), subsequent encounter for fracture with routine healing: Secondary | ICD-10-CM | POA: Diagnosis not present

## 2023-04-14 DIAGNOSIS — Z9049 Acquired absence of other specified parts of digestive tract: Secondary | ICD-10-CM | POA: Diagnosis not present

## 2023-04-15 ENCOUNTER — Other Ambulatory Visit: Payer: Self-pay | Admitting: Internal Medicine

## 2023-04-15 MED ORDER — OXYCODONE-ACETAMINOPHEN 7.5-325 MG PO TABS: 1.0000 | ORAL_TABLET | Freq: Four times a day (QID) | ORAL | 0 refills | Status: DC | PRN

## 2023-04-15 NOTE — Telephone Encounter (Signed)
 Rx for 5 to 7.5 already done on pt request this am  I cannot change now   thanks

## 2023-04-15 NOTE — Telephone Encounter (Signed)
 Rx for p

## 2023-04-16 ENCOUNTER — Ambulatory Visit: Attending: Medical | Admitting: Medical

## 2023-04-16 ENCOUNTER — Encounter: Payer: Self-pay | Admitting: Medical

## 2023-04-16 VITALS — BP 82/51 | HR 60 | Ht 61.0 in | Wt 107.4 lb

## 2023-04-16 DIAGNOSIS — I2510 Atherosclerotic heart disease of native coronary artery without angina pectoris: Secondary | ICD-10-CM | POA: Diagnosis not present

## 2023-04-16 DIAGNOSIS — Z95810 Presence of automatic (implantable) cardiac defibrillator: Secondary | ICD-10-CM

## 2023-04-16 DIAGNOSIS — I5022 Chronic systolic (congestive) heart failure: Secondary | ICD-10-CM

## 2023-04-16 DIAGNOSIS — Z720 Tobacco use: Secondary | ICD-10-CM | POA: Diagnosis not present

## 2023-04-16 DIAGNOSIS — E782 Mixed hyperlipidemia: Secondary | ICD-10-CM

## 2023-04-16 DIAGNOSIS — I428 Other cardiomyopathies: Secondary | ICD-10-CM

## 2023-04-16 DIAGNOSIS — I739 Peripheral vascular disease, unspecified: Secondary | ICD-10-CM

## 2023-04-16 MED ORDER — ROSUVASTATIN CALCIUM 10 MG PO TABS: 10.0000 mg | ORAL_TABLET | Freq: Every day | ORAL | 3 refills | Status: DC

## 2023-04-16 NOTE — Progress Notes (Signed)
 Cardiology Office Note:  .   Date:  04/16/2023  ID:  Kimberly [name], DOB 1945/03/02, MRN [id number] PCP: Corwin Levins, MD  [facility] Providers Cardiologist:  None Electrophysiologist:  Lewayne Bunting, MD {    History of Present Illness: Marland Kitchen   Kimberly [name] is a 78 y.o. female with history of VSD repair as a child, COPD, h/o tobacco use, PAD, and chronic systolic CHF who presents for follow-up for PAD.  The patient has a long history of cardiomyopathy.  She had VSD repair as a child and imaging has not showed residual VSD.  Cardiac MRI in 11/2007 showed EF of 36% echo in December 2015 showed EF of 40 to 45%.  Echo in March 2018 showed EF down to 15 to 20% with moderate RV systolic dysfunction.  Right and left heart cath in October 2018 showed nonobstructive coronary artery disease, but was concerning for low output.  She had a nonhealing right lower extremity wound.  Peripheral angiogram with bilateral CFA occlusions and underwent DCB/self-expanding stent to the right CFA.  PFTs in December 2018 showed severe COPD.  She had a Environmental manager CRT/D device placed.  July 2019 peripheral extremity Doppler showed significant in-stent restenosis of the right CFA stent.  Echo in April 2021 showed EF of 35 to 40% with mild AI.  Chest x-ray showed severe COPD, mild to moderate HF limitation.  November 2021 Cardiolite showed prior septal and apical infarction, no ischemia.  Echo in September 2022 showed EF of 35 to 40%, basal to mid septal akinesis and anterior hypokinesis, no residual VSD, mildly decreased RV SF.  Echo November 2023 showed EF of 40%, basal to mid anterior septal and inferoseptal akinesis, mild RV dysfunction.  Echo in September 2024 showed EF of 40%, basal to mid inferoseptal and anteroseptal akinesis, normal RV.  Patient was last seen by heart failure March 2025 and was stable from a cardiac perspective.  Today, she has a brace from compression fracture in T7, she has worn it for a few  months. Brace makes walking difficult and she has anxiety from it. She uses O2 at night. She denies leg pain, she has occasional right foot pain. She denies chest pain. She gets SOB when she feels anxious. BP is low, but she says this is chronic and known. She denies dizziness and lightheadedness.    Studies Reviewed: .       Echo 09/2022 1. Left ventricular ejection fraction, by estimation, is 40%. The left  ventricle has normal function. The left ventricle demonstrates regional  wall motion abnormalities with basal to mid inferoseptal and basal to mid  anteroseptal akinesis. Left  ventricular diastolic parameters are consistent with Grade I diastolic  dysfunction (impaired relaxation).   2. Right ventricular systolic function is normal. The right ventricular  size is normal. There is normal pulmonary artery systolic pressure. The  estimated right ventricular systolic pressure is 26.8 mmHg.   3. The mitral valve is abnormal with flattening of the mitral closure  plane without frank prolapse. Trivial mitral valve regurgitation. No  evidence of mitral stenosis.   4. The aortic valve is tricuspid. There is mild calcification of the  aortic valve. Aortic valve regurgitation is not visualized. No aortic  stenosis is present.   5. The inferior vena cava is normal in size with <50% respiratory  variability, suggesting right atrial pressure of 8 mmHg.   ABIs/TBIs 01/2022  Summary:  Right: 50-74% stenosis noted in the common femoral  artery stent.   Left: 50-74% stenosis noted in the iliac segment. Total occlusion noted in  the common femoral artery.     See table(s) above for measurements and observations.   Suggest follow up study in 12 months.   Summary:  Right: Resting right ankle-brachial index is within normal range. The  right toe-brachial index is normal.   Left: Resting left ankle-brachial index indicates moderate left lower  extremity arterial disease. The left toe-brachial  index is abnormal.   Echo 11/2021 1. Left ventricular ejection fraction, by estimation, is 40%. The left  ventricle has moderately decreased function. The left ventricle  demonstrates regional wall motion abnormalities with basal to mid  inferoseptal and anteroseptal akinesis. The left  ventricular internal cavity size was mildly dilated. Left ventricular  diastolic parameters are consistent with Grade I diastolic dysfunction  (impaired relaxation).   2. Right ventricular systolic function is mildly reduced. The right  ventricular size is mildly enlarged. There is normal pulmonary artery  systolic pressure. The estimated right ventricular systolic pressure is  33.0 mmHg.   3. Left atrial size was mildly dilated.   4. The mitral valve is normal in structure. Trivial mitral valve  regurgitation. No evidence of mitral stenosis.   5. The aortic valve is tricuspid. There is mild calcification of the  aortic valve. Aortic valve regurgitation is not visualized. No aortic  stenosis is present.   6. The inferior vena cava is dilated in size with >50% respiratory  variability, suggesting right atrial pressure of 8 mmHg.   Cardiac cath 10/2016  There is severe left ventricular systolic dysfunction. LV end diastolic pressure is mildly elevated. The left ventricular ejection fraction is less than 25% by visual estimate. There is moderate (3+) mitral regurgitation.   1. Minimal nonobstructive coronary artery disease. 2. Severely reduced LV systolic function with an EF of 15-20% with global hypokinesis. 3. At least moderate mitral regurgitation by left ventricular angiography. 4. Right heart catheterization showed mildly elevated filling pressures, mild pulmonary hypertension and severely reduced cardiac output. Cardiac output was 1.96 with a cardiac index 1.33.   Recommendations: The patient has nonischemic cardiomyopathy with severely reduced LV systolic function.  I'm going to add small dose  carvedilol. Consider referral to the heart failure clinic.   The patient does have evidence of peripheral arterial disease with subtotal occlusion of the right common femoral artery with collaterals. Consider further evaluation of this based on symptoms.       Physical Exam:   VS:  BP (!) 82/51   Pulse 60   Ht 5\' 1"  (1.549 m)   Wt 107 lb 6.4 oz (48.7 kg)   SpO2 95%   BMI 20.29 kg/m    Wt Readings from Last 3 Encounters:  04/16/23 107 lb 6.4 oz (48.7 kg)  03/26/23 108 lb 3.2 oz (49.1 kg)  03/18/23 111 lb (50.3 kg)    GEN: Well nourished, well developed in no acute distress NECK: No JVD; No carotid bruits CARDIAC: RRR, no murmurs, rubs, gallops RESPIRATORY:  Clear to auscultation without rales, wheezing or rhonchi  ABDOMEN: Soft, non-tender, non-distended EXTREMITIES:  No edema; No deformity   ASSESSMENT AND PLAN: .    PAD S/p endovascular intervention to occluded right CFA 11/2016, subsequent arterial showed ISR. She reports occasional right foot pain, no leg pain. ABIs in 01/2022 showed 50-74% stenosis and 50-74% stenosis, total occlusion in the common femoral artery. Pulses are distant. I will recheck ABI/TBIs.Continue ASA and statin.   CAD  Nonobstructive on CAD in 10/2016. Cardiolite in 11/2019 showed no evidence of ischemia. She denies anginal symptoms. Continue ASA, statin and BB therapy.   Chronic systolic heart failure/NICM S/p CRT-D She is followed closely by heart failure team. She is euvolemic on exam. Echo 09/2022 showed LVEF 40%, basal-mid inferoseptal and anteroseptal akinesis, normal RV. Continue lasix 40mg  BID, Entresto 24-26mg  BID, bisoprolol 2.5mg  daily, spiro 25mg  daily and digoxin. Unable to tolerate [place]. BP is low, but she says this is known and chronic. She denies symptoms. Management per AHF team.  HLD LDL 66, HDL 118, TG 80, total chol 200. Continue Crestor 10mg  daily, we will refill this.   Tobacco use/COPD She quit smoking  Dispo: Follow-up in 1  year  Signed, Tahmir Kleckner David Stall, PA-C

## 2023-04-16 NOTE — Patient Instructions (Addendum)
 Medication Instructions:  Your physician recommends that you continue on your current medications as directed. Please refer to the Current Medication list given to you today.   *If you need a refill on your cardiac medications before your next appointment, please call your pharmacy*   Lab Work: No labs ordered today    Testing/Procedures: Your physician has requested that you have an ankle brachial index (ABI). During this test an ultrasound and blood pressure cuff are used to evaluate the arteries that supply the arms and legs with blood.  Allow thirty minutes for this exam.  There are no restrictions or special instructions.  This will take place at [address] [facility] [address]) [address], [place] [address]  Please note: We ask at that you not bring children with you during ultrasound (echo/ vascular) testing. Due to room size and safety concerns, children are not allowed in the ultrasound rooms during exams. Our front office staff cannot provide observation of children in our lobby area while testing is being conducted. An adult accompanying a patient to their appointment will only be allowed in the ultrasound room at the discretion of the ultrasound technician under special circumstances. We apologize for any inconvenience.    Follow-Up: At [facility], you and your health needs are our priority.  As part of our continuing mission to provide you with exceptional heart care, we have created designated Provider Care Teams.  These Care Teams include your primary Cardiologist (physician) and Advanced Practice Providers (APPs -  Physician Assistants and Nurse Practitioners) who all work together to provide you with the care you need, when you need it.  We recommend signing up for the patient portal called "MyChart".  Sign up information is provided on this After Visit Summary.  MyChart is used to connect with patients for Virtual Visits (Telemedicine).  Patients are able to  view lab/test results, encounter notes, upcoming appointments, etc.  Non-urgent messages can be sent to your provider as well.   To learn more about what you can do with MyChart, go to [email or web address].    Your next appointment:   1 year(s)  Provider:   Terrilee Croak, PA-C

## 2023-04-17 DIAGNOSIS — Z7952 Long term (current) use of systemic steroids: Secondary | ICD-10-CM | POA: Diagnosis not present

## 2023-04-17 DIAGNOSIS — Z79891 Long term (current) use of opiate analgesic: Secondary | ICD-10-CM | POA: Diagnosis not present

## 2023-04-17 DIAGNOSIS — J4489 Other specified chronic obstructive pulmonary disease: Secondary | ICD-10-CM | POA: Diagnosis not present

## 2023-04-17 DIAGNOSIS — I110 Hypertensive heart disease with heart failure: Secondary | ICD-10-CM | POA: Diagnosis not present

## 2023-04-17 DIAGNOSIS — I5022 Chronic systolic (congestive) heart failure: Secondary | ICD-10-CM | POA: Diagnosis not present

## 2023-04-17 DIAGNOSIS — Z7982 Long term (current) use of aspirin: Secondary | ICD-10-CM | POA: Diagnosis not present

## 2023-04-17 DIAGNOSIS — I4719 Other supraventricular tachycardia: Secondary | ICD-10-CM | POA: Diagnosis not present

## 2023-04-17 DIAGNOSIS — I252 Old myocardial infarction: Secondary | ICD-10-CM | POA: Diagnosis not present

## 2023-04-17 DIAGNOSIS — Q210 Ventricular septal defect: Secondary | ICD-10-CM | POA: Diagnosis not present

## 2023-04-17 DIAGNOSIS — Z8673 Personal history of transient ischemic attack (TIA), and cerebral infarction without residual deficits: Secondary | ICD-10-CM | POA: Diagnosis not present

## 2023-04-17 DIAGNOSIS — M8008XD Age-related osteoporosis with current pathological fracture, vertebra(e), subsequent encounter for fracture with routine healing: Secondary | ICD-10-CM | POA: Diagnosis not present

## 2023-04-17 DIAGNOSIS — G43909 Migraine, unspecified, not intractable, without status migrainosus: Secondary | ICD-10-CM | POA: Diagnosis not present

## 2023-04-17 DIAGNOSIS — R350 Frequency of micturition: Secondary | ICD-10-CM | POA: Diagnosis not present

## 2023-04-17 DIAGNOSIS — K219 Gastro-esophageal reflux disease without esophagitis: Secondary | ICD-10-CM | POA: Diagnosis not present

## 2023-04-17 DIAGNOSIS — Z9049 Acquired absence of other specified parts of digestive tract: Secondary | ICD-10-CM | POA: Diagnosis not present

## 2023-04-17 DIAGNOSIS — I493 Ventricular premature depolarization: Secondary | ICD-10-CM | POA: Diagnosis not present

## 2023-04-17 DIAGNOSIS — G540 Brachial plexus disorders: Secondary | ICD-10-CM | POA: Diagnosis not present

## 2023-04-17 DIAGNOSIS — Z95810 Presence of automatic (implantable) cardiac defibrillator: Secondary | ICD-10-CM | POA: Diagnosis not present

## 2023-04-17 DIAGNOSIS — Z7951 Long term (current) use of inhaled steroids: Secondary | ICD-10-CM | POA: Diagnosis not present

## 2023-04-17 DIAGNOSIS — E785 Hyperlipidemia, unspecified: Secondary | ICD-10-CM | POA: Diagnosis not present

## 2023-04-17 DIAGNOSIS — Z85828 Personal history of other malignant neoplasm of skin: Secondary | ICD-10-CM | POA: Diagnosis not present

## 2023-04-20 NOTE — Progress Notes (Unsigned)
 Referring Physician:  Corwin Levins, MD [address] [place],  [place] [address]  Primary Physician:  Corwin Levins, MD  History of Present Illness: [name] has a history of repair of VSD x 2, HTN, PVD, dilated cardiomyopathy, chronic systolic CHF, severe COPD, GERD, hepatic cirrhosis, osteoporosis, dyslipidemia, ICD.   She is on 2L O2 prn.   She has an implantable cardioverter-defibrillator.   Last seen by me on 03/18/23 for T7 compression fracture that likely occurred around 02/27/23.   She is here for follow up and repeat xrays.   She has been wearing her TLSO brace at all times except for at night. She feels very anxious with the brace on and she has been "hyperventilating." She took 1/2 a xanax this morning and it helped.   She has minimal intermittent mid back pain that has improved. No radiation to her ribs. No arm or leg pain. No numbness, tingling, or weakness.   She was given percocet refill by her PCP and he increased it to percocet 7.5. She takes rarely (maybe one a day prn). She is on chronic prednisone.   She is not smoking.   No bowel or bladder issues.   Past Surgery: none  The symptoms are causing a significant impact on the patient's life.   Review of Systems:  A 10 point review of systems is negative, except for the pertinent positives and negatives detailed in the HPI.  Past Medical History: Past Medical History:  Diagnosis Date   AICD (automatic cardioverter/defibrillator) present 03/17/2017   Anxiety    Atrial tachycardia (HCC)    Basal cell carcinoma    R ant thigh, L pretibia, both txted at [facility]   Bursitis of shoulder, right, adhesive    Cancer (HCC)    CHF (congestive heart failure) (HCC)    Chronic bronchitis (HCC)    "1-2 times/yr" (01/23/2014)   COPD (chronic obstructive pulmonary disease) (HCC)    CVD (cerebrovascular disease)    Dyslipidemia    Dysrhythmia    Frequency of urination    GERD (gastroesophageal  reflux disease)    Heart murmur    History of stomach ulcers    HTN (hypertension) 02/22/2011   Migraines    "stopped many years ago" (06/14/2014)   Osteoporosis 08/19/2016   Pericarditis    Pneumonia "10 times" (06/14/2014)   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)    Silent myocardial infarction (HCC) "late 1990's"   Stress incontinence    "was suppose to have been tacked up years ago but I didn't do it"   Syncope, near    Associated with atrial tachycardia-event recorder 1/16   Thoracic outlet syndrome    VSD (ventricular septal defect)     Past Surgical History: Past Surgical History:  Procedure Laterality Date   ABDOMINAL AORTOGRAM W/LOWER EXTREMITY N/A 12/17/2016   Procedure: ABDOMINAL AORTOGRAM W/LOWER EXTREMITY;  Surgeon: Iran Ouch, MD;  Location: [facility] INVASIVE CV LAB;  Service: Cardiovascular;  Laterality: N/A;   BIV ICD INSERTION CRT-D N/A 03/17/2017   Procedure: BIV ICD INSERTION CRT-D;  Surgeon: Marinus Maw, MD;  Location: [facility] INVASIVE CV LAB;  Service: Cardiovascular;  Laterality: N/A;   CARDIAC CATHETERIZATION  "quite a few"   CESAREAN SECTION  1972   CHOLECYSTECTOMY OPEN  1970's   CORONARY ANGIOGRAM  2000   No significant CAD   ELECTROPHYSIOLOGIC STUDY N/A 06/14/2014   Procedure: A-Flutter/A-Tach/SVT Ablation;  Surgeon: Marinus Maw, MD;  Location: [facility] INVASIVE CV LAB;  Service: Cardiovascular;  Laterality: N/A;   INSERTION OF ICD  03/17/2017   BIV   MULTIPLE TOOTH EXTRACTIONS     MYRINGOTOMY WITH TUBE PLACEMENT Right 2015   PERIPHERAL VASCULAR INTERVENTION  12/17/2016   Procedure: PERIPHERAL VASCULAR INTERVENTION;  Surgeon: Iran Ouch, MD;  Location: [facility] INVASIVE CV LAB;  Service: Cardiovascular;;  Right common femoral PTA and Stent   RIGHT/LEFT HEART CATH AND CORONARY ANGIOGRAPHY Bilateral 11/06/2016   Procedure: RIGHT/LEFT HEART CATH AND CORONARY ANGIOGRAPHY;  Surgeon: Iran Ouch, MD;  Location: [facility]  INVASIVE CV LAB;  Service: Cardiovascular;  Laterality: Bilateral;   SVT ABLATION  06/14/2014   TUBAL LIGATION  1972   VSD REPAIR  1958; 1967    Allergies: Allergies as of 04/22/2023 - Review Complete 04/22/2023  Allergen Reaction Noted   Mupirocin Shortness Of Breath and Other (See Comments) 08/26/2016   Codeine Nausea Only 11/13/2008    Medications: Outpatient Encounter Medications as of 04/22/2023  Medication Sig   albuterol (PROVENTIL) (2.5 MG/3ML) 0.083% nebulizer solution Take 3 mLs (2.5 mg total) by nebulization every 6 (six) hours as needed for wheezing or shortness of breath. TAKE 3 MLS (1 VIAL) BY NEBULIZATION EVERY 6 HOURS AS NEEDED FOR WHEEZING OR SHORTNESS OF BREATH   albuterol (VENTOLIN HFA) 108 (90 Base) MCG/ACT inhaler Inhale 2 puffs into the lungs every 6 (six) hours as needed for wheezing or shortness of breath.   ALPRAZolam (XANAX) 0.5 MG tablet TAKE 1 TABLET BY MOUTH TWICE PER DAY AS NEEDED   aspirin 81 MG tablet Take 1 tablet (81 mg total) by mouth daily.   bisoprolol (ZEBETA) 5 MG tablet TAKE 1/2 TABLET (2.5 MG TOTAL) BY MOUTH DAILY   Budeson-Glycopyrrol-Formoterol (BREZTRI AEROSPHERE) 160-9-4.8 MCG/ACT AERO Inhale 2 puffs into the lungs in the morning and at bedtime.   Budeson-Glycopyrrol-Formoterol (BREZTRI AEROSPHERE) 160-9-4.8 MCG/ACT AERO Inhale 2 puffs into the lungs in the morning and at bedtime.   budesonide (PULMICORT) 0.25 MG/2ML nebulizer solution One vial twice daily thru nebulizer with albuterol   digoxin (LANOXIN) 0.125 MG tablet TAKE 1/2 TABLET (62.5 MCG TOTAL) BY MOUTH DAILY   furosemide (LASIX) 20 MG tablet Take 2 tablets (40 mg total) by mouth 2 (two) times daily.   oxyCODONE-acetaminophen (PERCOCET) 7.5-325 MG tablet Take 1 tablet by mouth every 6 (six) hours as needed for severe pain (pain score 7-10).   predniSONE (DELTASONE) 10 MG tablet Take 1 tablet (10 mg total) by mouth daily with breakfast. (Patient taking differently: Take 10 mg by mouth  every other day.)   rosuvastatin (CRESTOR) 10 MG tablet Take 1 tablet (10 mg total) by mouth daily.   sacubitril-valsartan (ENTRESTO) 24-26 MG Take 1 tablet by mouth 2 (two) times daily.   spironolactone (ALDACTONE) 25 MG tablet Take 1 tablet (25 mg total) by mouth at bedtime.   tizanidine (ZANAFLEX) 2 MG capsule Take 2 mg by mouth as needed for muscle spasms.   [DISCONTINUED] oxyCODONE-acetaminophen (PERCOCET) 5-325 MG tablet Take 1 tablet by mouth every 6 (six) hours as needed for severe pain (pain score 7-10).   No facility-administered encounter medications on file as of 04/22/2023.    Social History: Social History   Tobacco Use   Smoking status: Former    Current packs/day: 0.50    Average packs/day: 0.5 packs/day for 35.0 years (17.5 ttl pk-yrs)    Types: Cigarettes   Smokeless tobacco: Never   Tobacco comments:    3-4 cigarettes daily. Trying to  quit. HS.  Quit smoking 4 months ago.  Sneaks cigarettes.  12/31/2022 hfb  Vaping Use   Vaping status: Never Used  Substance Use Topics   Alcohol use: Not Currently    Alcohol/week: 1.0 standard drink of alcohol    Types: 1 Glasses of wine per week    Comment: 06/14/2014 "glass of wine maybe once/month"   Drug use: No    Family Medical History: Family History  Problem Relation Age of Onset   Cancer Mother    Cancer Father    Throat cancer Other    Hypertension Other    Stroke Other    Alcohol abuse Other    Arthritis Other    Cancer Other        lung cancer   Hypertension Other    Arthritis Other    Stroke Other    Breast cancer Neg Hx     Physical Examination: Vitals:   04/22/23 1051  BP: (!) 94/42      Awake, alert, oriented to person, place, and time.  Speech is clear and fluent. Fund of knowledge is appropriate.   Cranial Nerves: Pupils equal round and reactive to light.  Facial tone is symmetric.    No tenderness over T7 region.   No abnormal lesions on exposed skin.   Strength: Side Biceps Triceps  Deltoid Interossei Grip Wrist Ext. Wrist Flex.  R 5 5 5 5 5 5 5   L 5 5 5 5 5 5 5    Side Iliopsoas Quads Hamstring PF DF EHL  R 5 5 5 5 5 5   L 5 5 5 5 5 5    Reflexes are 2+ and symmetric at the biceps, brachioradialis, patella and achilles.   Hoffman's is absent.  Clonus is not present.   Bilateral upper and lower extremity sensation is intact to light touch.     Gait is normal.     Medical Decision Making  Imaging: Thoracic xrays dated 04/22/23:  T7 compression fracture that appears stable in comparison to previous thoracic xrays.   Report not available yet for above xrays.   Assessment and Plan: Ms. Paulsen had severe coughing fit  on 02/27/23 and was found to have T7 compression fracture during ED visit on 03/01/23.   She has minimal intermittent mid back pain that has improved. No radiation to her ribs. No arm or leg pain. No numbness, tingling, or weakness.   Xrays from today show some stable T7 compression fracture in comparison to previous thoracic xrays.   She is not doing well with brace. It is making her anxious and making her breathing worse. She has been wearing it all times except for at night.   Treatment options discussed with patient and following plan made:   - She should wear TLSO brace when up and walking. Can remove when sitting. Do not wear when sleeping.  - No bending, twisting, or lifting.  - Continue on prn percocet from PCP.  - Will review imaging with Dr. Katrinka Blazing to see if she can d/c brace early.  - Follow up with me in 4 weeks with repeat xrays.   Of note, her blood pressure was 94/42. She has documented low blood pressure. She denies any symptoms.   I spent a total of 25 minutes in face-to-face and non-face-to-face activities related to this patient's care today including review of outside records, review of imaging, review of symptoms, physical exam, discussion of differential diagnosis, discussion of treatment options, and documentation.   Thank  you  for involving me in the care of this patient.   Drake Leach PA-C Dept. of Neurosurgery

## 2023-04-21 NOTE — Progress Notes (Signed)
 Remote ICD transmission.

## 2023-04-21 NOTE — Addendum Note (Signed)
 Addended by: Elease Etienne A on: 04/21/2023 03:46 PM   Modules accepted: Orders

## 2023-04-22 ENCOUNTER — Encounter: Payer: Self-pay | Admitting: Orthopedic Surgery

## 2023-04-22 ENCOUNTER — Ambulatory Visit
Admission: RE | Admit: 2023-04-22 | Discharge: 2023-04-22 | Disposition: A | Discharge status: Home / Self Care | Source: Ambulatory Visit | Attending: Orthopedic Surgery | Admitting: Orthopedic Surgery

## 2023-04-22 ENCOUNTER — Ambulatory Visit: Payer: Medicare Other | Admitting: Orthopedic Surgery

## 2023-04-22 ENCOUNTER — Ambulatory Visit
Admission: RE | Admit: 2023-04-22 | Discharge: 2023-04-22 | Disposition: A | Discharge status: Home / Self Care | Attending: Orthopedic Surgery | Admitting: Orthopedic Surgery

## 2023-04-22 VITALS — BP 94/42 | Ht 61.0 in | Wt 107.0 lb

## 2023-04-22 DIAGNOSIS — S22060A Wedge compression fracture of T7-T8 vertebra, initial encounter for closed fracture: Secondary | ICD-10-CM | POA: Insufficient documentation

## 2023-04-22 DIAGNOSIS — S22060D Wedge compression fracture of T7-T8 vertebra, subsequent encounter for fracture with routine healing: Secondary | ICD-10-CM

## 2023-04-22 DIAGNOSIS — M4854XA Collapsed vertebra, not elsewhere classified, thoracic region, initial encounter for fracture: Secondary | ICD-10-CM | POA: Diagnosis not present

## 2023-04-22 DIAGNOSIS — M40209 Unspecified kyphosis, site unspecified: Secondary | ICD-10-CM | POA: Diagnosis not present

## 2023-04-22 NOTE — Patient Instructions (Signed)
 It was so nice to see you today. Thank you so much for coming in.    You have a broken bone in your back at T7.  Continue to wear your TLSO brace when you are up and walking. Do not wear while sleeping. You can remove if you are sitting and watching television.   No bending, twisting, or lifting.   I want to see you back in 4 weeks. You will need xrays prior to that visit.   Please do not hesitate to call if you have any questions or concerns. You can also message me in MyChart.   Drake Leach PA-C [phone]     The physicians and staff at [facility] are committed to providing excellent care. You may receive a survey asking for feedback about your experience at our office. We value you your feedback and appreciate you taking the time to to fill it out. The [facility] leadership team is also available to discuss your experience in person, feel free to contact us [phone].

## 2023-04-28 ENCOUNTER — Telehealth: Payer: Self-pay | Admitting: Internal Medicine

## 2023-04-28 DIAGNOSIS — Z8673 Personal history of transient ischemic attack (TIA), and cerebral infarction without residual deficits: Secondary | ICD-10-CM | POA: Diagnosis not present

## 2023-04-28 DIAGNOSIS — I4719 Other supraventricular tachycardia: Secondary | ICD-10-CM | POA: Diagnosis not present

## 2023-04-28 DIAGNOSIS — I110 Hypertensive heart disease with heart failure: Secondary | ICD-10-CM | POA: Diagnosis not present

## 2023-04-28 DIAGNOSIS — J4489 Other specified chronic obstructive pulmonary disease: Secondary | ICD-10-CM | POA: Diagnosis not present

## 2023-04-28 DIAGNOSIS — Q210 Ventricular septal defect: Secondary | ICD-10-CM | POA: Diagnosis not present

## 2023-04-28 DIAGNOSIS — M8008XD Age-related osteoporosis with current pathological fracture, vertebra(e), subsequent encounter for fracture with routine healing: Secondary | ICD-10-CM | POA: Diagnosis not present

## 2023-04-28 DIAGNOSIS — G43909 Migraine, unspecified, not intractable, without status migrainosus: Secondary | ICD-10-CM | POA: Diagnosis not present

## 2023-04-28 DIAGNOSIS — G540 Brachial plexus disorders: Secondary | ICD-10-CM | POA: Diagnosis not present

## 2023-04-28 DIAGNOSIS — R350 Frequency of micturition: Secondary | ICD-10-CM | POA: Diagnosis not present

## 2023-04-28 DIAGNOSIS — I252 Old myocardial infarction: Secondary | ICD-10-CM | POA: Diagnosis not present

## 2023-04-28 DIAGNOSIS — I493 Ventricular premature depolarization: Secondary | ICD-10-CM | POA: Diagnosis not present

## 2023-04-28 DIAGNOSIS — Z7952 Long term (current) use of systemic steroids: Secondary | ICD-10-CM | POA: Diagnosis not present

## 2023-04-28 DIAGNOSIS — Z7951 Long term (current) use of inhaled steroids: Secondary | ICD-10-CM | POA: Diagnosis not present

## 2023-04-28 DIAGNOSIS — Z85828 Personal history of other malignant neoplasm of skin: Secondary | ICD-10-CM | POA: Diagnosis not present

## 2023-04-28 DIAGNOSIS — K219 Gastro-esophageal reflux disease without esophagitis: Secondary | ICD-10-CM | POA: Diagnosis not present

## 2023-04-28 DIAGNOSIS — E785 Hyperlipidemia, unspecified: Secondary | ICD-10-CM | POA: Diagnosis not present

## 2023-04-28 DIAGNOSIS — Z95810 Presence of automatic (implantable) cardiac defibrillator: Secondary | ICD-10-CM | POA: Diagnosis not present

## 2023-04-28 DIAGNOSIS — Z9049 Acquired absence of other specified parts of digestive tract: Secondary | ICD-10-CM | POA: Diagnosis not present

## 2023-04-28 DIAGNOSIS — Z79891 Long term (current) use of opiate analgesic: Secondary | ICD-10-CM | POA: Diagnosis not present

## 2023-04-28 DIAGNOSIS — I5022 Chronic systolic (congestive) heart failure: Secondary | ICD-10-CM | POA: Diagnosis not present

## 2023-04-28 DIAGNOSIS — Z7982 Long term (current) use of aspirin: Secondary | ICD-10-CM | POA: Diagnosis not present

## 2023-04-28 NOTE — Telephone Encounter (Signed)
 Copied from CRM [id number]. Topic: Clinical - Home Health Verbal Orders >> Apr 28, 2023 11:39 AM Fredrich Romans wrote: Caller/Agency: Dawn/[facility] Callback Number: [phone]  Dawn called to report that Patient would like to be discharged from services.Stated that she's doing good and no longer needs services.

## 2023-05-13 ENCOUNTER — Other Ambulatory Visit: Payer: Self-pay | Admitting: Internal Medicine

## 2023-05-13 MED ORDER — OXYCODONE-ACETAMINOPHEN 7.5-325 MG PO TABS: 1.0000 | ORAL_TABLET | Freq: Four times a day (QID) | ORAL | 0 refills | Status: DC | PRN

## 2023-05-14 ENCOUNTER — Other Ambulatory Visit (HOSPITAL_COMMUNITY): Payer: Self-pay | Admitting: Family Medicine

## 2023-05-19 ENCOUNTER — Other Ambulatory Visit: Payer: Self-pay | Admitting: *Deleted

## 2023-05-19 ENCOUNTER — Ambulatory Visit: Attending: Medical

## 2023-05-19 DIAGNOSIS — I739 Peripheral vascular disease, unspecified: Secondary | ICD-10-CM

## 2023-05-20 LAB — VAS US ABI WITH/WO TBI
Left ABI: 0.54
Right ABI: 0.76

## 2023-05-21 ENCOUNTER — Other Ambulatory Visit: Payer: Self-pay

## 2023-05-21 DIAGNOSIS — I739 Peripheral vascular disease, unspecified: Secondary | ICD-10-CM

## 2023-05-22 NOTE — Progress Notes (Unsigned)
 Referring Physician:  Roslyn Coombe, MD [address] [place],  [place] [address]  Primary Physician:  Roslyn Coombe, MD  History of Present Illness: [name] has a history of repair of VSD x 2, HTN, PVD, dilated cardiomyopathy, chronic systolic CHF, severe COPD, GERD, hepatic cirrhosis, osteoporosis, dyslipidemia, ICD.   She is on 2L O2 prn.   She has an implantable cardioverter-defibrillator.   Last seen by me on 04/22/23 for T7 compression fracture that likely occurred around 02/27/23.   TLSO brace was exacerbating her pulmonary issues. She told at last visit that she could move brace as needed. No bending, twisting, or lifting.   She is here for follow up and repeat xrays.   She is doing well. Had a twinge of back pain when she bend over to pick something up last week. No arm or leg pain. No numbness, tingling, or weakness.   Anxiety has been better since stopping her TLSO brace.   She takes prn percocet (from PCP) rarely. She is on chronic prednisone .   She is not smoking.   No bowel or bladder issues.   Past Surgery: none  The symptoms are causing a significant impact on the patient's life.   Review of Systems:  A 10 point review of systems is negative, except for the pertinent positives and negatives detailed in the HPI.  Past Medical History: Past Medical History:  Diagnosis Date   AICD (automatic cardioverter/defibrillator) present 03/17/2017   Anxiety    Atrial tachycardia (HCC)    Basal cell carcinoma    R ant thigh, L pretibia, both txted at [facility]   Bursitis of shoulder, right, adhesive    Cancer (HCC)    CHF (congestive heart failure) (HCC)    Chronic bronchitis (HCC)    "1-2 times/yr" (01/23/2014)   COPD (chronic obstructive pulmonary disease) (HCC)    CVD (cerebrovascular disease)    Dyslipidemia    Dysrhythmia    Frequency of urination    GERD (gastroesophageal reflux disease)    Heart murmur    History of stomach ulcers     HTN (hypertension) 02/22/2011   Migraines    "stopped many years ago" (06/14/2014)   Osteoporosis 08/19/2016   Pericarditis    Pneumonia "10 times" (06/14/2014)   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)    Silent myocardial infarction (HCC) "late 1990's"   Stress incontinence    "was suppose to have been tacked up years ago but I didn't do it"   Syncope, near    Associated with atrial tachycardia-event recorder 1/16   Thoracic outlet syndrome    VSD (ventricular septal defect)     Past Surgical History: Past Surgical History:  Procedure Laterality Date   ABDOMINAL AORTOGRAM W/LOWER EXTREMITY N/A 12/17/2016   Procedure: ABDOMINAL AORTOGRAM W/LOWER EXTREMITY;  Surgeon: Wenona Hamilton, MD;  Location: [facility] INVASIVE CV LAB;  Service: Cardiovascular;  Laterality: N/A;   BIV ICD INSERTION CRT-D N/A 03/17/2017   Procedure: BIV ICD INSERTION CRT-D;  Surgeon: Tammie Fall, MD;  Location: [facility] INVASIVE CV LAB;  Service: Cardiovascular;  Laterality: N/A;   CARDIAC CATHETERIZATION  "quite a few"   CESAREAN SECTION  1972   CHOLECYSTECTOMY OPEN  1970's   CORONARY ANGIOGRAM  2000   No significant CAD   ELECTROPHYSIOLOGIC STUDY N/A 06/14/2014   Procedure: A-Flutter/A-Tach/SVT Ablation;  Surgeon: Tammie Fall, MD;  Location: [facility] INVASIVE CV LAB;  Service: Cardiovascular;  Laterality: N/A;  INSERTION OF ICD  03/17/2017   BIV   MULTIPLE TOOTH EXTRACTIONS     MYRINGOTOMY WITH TUBE PLACEMENT Right 2015   PERIPHERAL VASCULAR INTERVENTION  12/17/2016   Procedure: PERIPHERAL VASCULAR INTERVENTION;  Surgeon: Wenona Hamilton, MD;  Location: [facility] INVASIVE CV LAB;  Service: Cardiovascular;;  Right common femoral PTA and Stent   RIGHT/LEFT HEART CATH AND CORONARY ANGIOGRAPHY Bilateral 11/06/2016   Procedure: RIGHT/LEFT HEART CATH AND CORONARY ANGIOGRAPHY;  Surgeon: Wenona Hamilton, MD;  Location: [facility] INVASIVE CV LAB;  Service: Cardiovascular;  Laterality: Bilateral;    SVT ABLATION  06/14/2014   TUBAL LIGATION  1972   VSD REPAIR  1958; 1967    Allergies: Allergies as of 05/25/2023 - Review Complete 04/22/2023  Allergen Reaction Noted   Mupirocin  Shortness Of Breath and Other (See Comments) 08/26/2016   Codeine Nausea Only 11/13/2008    Medications: Outpatient Encounter Medications as of 05/25/2023  Medication Sig   albuterol  (PROVENTIL ) (2.5 MG/3ML) 0.083% nebulizer solution Take 3 mLs (2.5 mg total) by nebulization every 6 (six) hours as needed for wheezing or shortness of breath. TAKE 3 MLS (1 VIAL) BY NEBULIZATION EVERY 6 HOURS AS NEEDED FOR WHEEZING OR SHORTNESS OF BREATH   albuterol  (VENTOLIN  HFA) 108 (90 Base) MCG/ACT inhaler Inhale 2 puffs into the lungs every 6 (six) hours as needed for wheezing or shortness of breath.   ALPRAZolam  (XANAX ) 0.5 MG tablet TAKE 1 TABLET BY MOUTH TWICE PER DAY AS NEEDED   aspirin  81 MG tablet Take 1 tablet (81 mg total) by mouth daily.   bisoprolol  (ZEBETA ) 5 MG tablet TAKE 1/2 TABLET (2.5 MG TOTAL) BY MOUTH DAILY   Budeson-Glycopyrrol-Formoterol  (BREZTRI  AEROSPHERE) 160-9-4.8 MCG/ACT AERO Inhale 2 puffs into the lungs in the morning and at bedtime.   Budeson-Glycopyrrol-Formoterol  (BREZTRI  AEROSPHERE) 160-9-4.8 MCG/ACT AERO Inhale 2 puffs into the lungs in the morning and at bedtime.   budesonide  (PULMICORT ) 0.25 MG/2ML nebulizer solution One vial twice daily thru nebulizer with albuterol    digoxin  (LANOXIN ) 0.125 MG tablet TAKE 1/2 TABLET (62.5 MCG TOTAL) BY MOUTH DAILY   furosemide  (LASIX ) 20 MG tablet Take 2 tablets (40 mg total) by mouth 2 (two) times daily.   oxyCODONE -acetaminophen  (PERCOCET) 7.5-325 MG tablet Take 1 tablet by mouth every 6 (six) hours as needed for severe pain (pain score 7-10).   predniSONE  (DELTASONE ) 10 MG tablet Take 1 tablet (10 mg total) by mouth daily with breakfast. (Patient taking differently: Take 10 mg by mouth every other day.)   rosuvastatin  (CRESTOR ) 10 MG tablet Take 1 tablet (10  mg total) by mouth daily.   sacubitril -valsartan  (ENTRESTO ) 24-26 MG Take 1 tablet by mouth 2 (two) times daily.   spironolactone  (ALDACTONE ) 25 MG tablet Take 1 tablet (25 mg total) by mouth at bedtime.   tizanidine  (ZANAFLEX ) 2 MG capsule Take 2 mg by mouth as needed for muscle spasms.   No facility-administered encounter medications on file as of 05/25/2023.    Social History: Social History   Tobacco Use   Smoking status: Former    Current packs/day: 0.50    Average packs/day: 0.5 packs/day for 35.0 years (17.5 ttl pk-yrs)    Types: Cigarettes   Smokeless tobacco: Never   Tobacco comments:    3-4 cigarettes daily. Trying to quit. HS.  Quit smoking 4 months ago.  Sneaks cigarettes.  12/31/2022 hfb  Vaping Use   Vaping status: Never Used  Substance Use Topics   Alcohol use: Not Currently    Alcohol/week: 1.0  standard drink of alcohol    Types: 1 Glasses of wine per week    Comment: 06/14/2014 "glass of wine maybe once/month"   Drug use: No    Family Medical History: Family History  Problem Relation Age of Onset   Cancer Mother    Cancer Father    Throat cancer Other    Hypertension Other    Stroke Other    Alcohol abuse Other    Arthritis Other    Cancer Other        lung cancer   Hypertension Other    Arthritis Other    Stroke Other    Breast cancer Neg Hx     Physical Examination: There were no vitals filed for this visit.  Awake, alert, oriented to person, place, and time.  Speech is clear and fluent. Fund of knowledge is appropriate.   Cranial Nerves: Pupils equal round and reactive to light.  Facial tone is symmetric.    Minimal tenderness over T7 region.   No abnormal lesions on exposed skin.   Strength: Side Biceps Triceps Deltoid Interossei Grip Wrist Ext. Wrist Flex.  R 5 5 5 5 5 5 5   L 5 5 5 5 5 5 5    Side Iliopsoas Quads Hamstring PF DF EHL  R 5 5 5 5 5 5   L 5 5 5 5 5 5    Reflexes are 2+ and symmetric at the biceps, brachioradialis, patella  and achilles.   Hoffman's is absent.  Clonus is not present.   Bilateral upper and lower extremity sensation is intact to light touch.     Gait is normal.     Medical Decision Making  Imaging: Thoracic xrays dated 05/25/23:  T7 compression fracture that appears stable in comparison to previous thoracic xrays.   Report not available yet for above xrays.   Assessment and Plan: Ms. Diskin had severe coughing fit  on 02/27/23 and was found to have T7 compression fracture during ED visit on 03/01/23.   She is doing well. Had a twinge of back pain when she bend over to pick something up last week. No arm or leg pain. No numbness, tingling, or weakness.   Xrays from today show stable T7 compression fracture in comparison to previous thoracic xrays.   Treatment options discussed with patient and following plan made:   - She can slowly return to activity as tolerated. Would still avoid any heavy lifting.  - Continue on prn percocet from PCP.  - Follow up with me prn.   Of note, her blood pressure was 90/52. She has documented low blood pressure. She denies any symptoms.   I spent a total of 15 minutes in face-to-face and non-face-to-face activities related to this patient's care today including review of outside records, review of imaging, review of symptoms, physical exam, discussion of differential diagnosis, discussion of treatment options, and documentation.   Lucetta Russel PA-C Dept. of Neurosurgery

## 2023-05-25 ENCOUNTER — Ambulatory Visit
Admission: RE | Admit: 2023-05-25 | Discharge: 2023-05-25 | Disposition: A | Discharge status: Home / Self Care | Source: Ambulatory Visit | Attending: Orthopedic Surgery | Admitting: Orthopedic Surgery

## 2023-05-25 ENCOUNTER — Encounter: Payer: Self-pay | Admitting: Orthopedic Surgery

## 2023-05-25 ENCOUNTER — Ambulatory Visit
Admission: RE | Admit: 2023-05-25 | Discharge: 2023-05-25 | Disposition: A | Discharge status: Home / Self Care | Attending: Orthopedic Surgery | Admitting: Orthopedic Surgery

## 2023-05-25 ENCOUNTER — Ambulatory Visit: Admitting: Orthopedic Surgery

## 2023-05-25 VITALS — BP 90/52 | Ht 61.0 in | Wt 107.0 lb

## 2023-05-25 DIAGNOSIS — S22060D Wedge compression fracture of T7-T8 vertebra, subsequent encounter for fracture with routine healing: Secondary | ICD-10-CM | POA: Insufficient documentation

## 2023-05-25 DIAGNOSIS — M4854XA Collapsed vertebra, not elsewhere classified, thoracic region, initial encounter for fracture: Secondary | ICD-10-CM | POA: Diagnosis not present

## 2023-06-01 ENCOUNTER — Encounter (HOSPITAL_COMMUNITY): Payer: Self-pay

## 2023-06-02 ENCOUNTER — Telehealth: Payer: Self-pay

## 2023-06-02 ENCOUNTER — Ambulatory Visit

## 2023-06-02 MED ORDER — SPIRONOLACTONE 25 MG PO TABS: 25.0000 mg | ORAL_TABLET | Freq: Every evening | ORAL | 6 refills | Status: DC

## 2023-06-02 NOTE — Telephone Encounter (Signed)
 Pt pharmacy called and stated that they do not have the most up to date script for spiro. Verified medication dose and sent in refills.

## 2023-06-03 ENCOUNTER — Ambulatory Visit: Payer: Medicare Other | Admitting: Internal Medicine

## 2023-06-06 ENCOUNTER — Other Ambulatory Visit: Payer: Self-pay | Admitting: Primary Care

## 2023-06-08 ENCOUNTER — Ambulatory Visit: Admitting: Internal Medicine

## 2023-06-10 ENCOUNTER — Ambulatory Visit (INDEPENDENT_AMBULATORY_CARE_PROVIDER_SITE_OTHER)

## 2023-06-10 ENCOUNTER — Ambulatory Visit: Attending: Cardiovascular Disease

## 2023-06-10 DIAGNOSIS — I739 Peripheral vascular disease, unspecified: Secondary | ICD-10-CM

## 2023-06-10 DIAGNOSIS — Z95828 Presence of other vascular implants and grafts: Secondary | ICD-10-CM

## 2023-06-12 ENCOUNTER — Ambulatory Visit: Admitting: Internal Medicine

## 2023-06-12 ENCOUNTER — Ambulatory Visit: Payer: Self-pay | Admitting: Cardiovascular Disease

## 2023-06-17 ENCOUNTER — Ambulatory Visit (INDEPENDENT_AMBULATORY_CARE_PROVIDER_SITE_OTHER): Payer: Medicare Other

## 2023-06-17 DIAGNOSIS — I428 Other cardiomyopathies: Secondary | ICD-10-CM | POA: Diagnosis not present

## 2023-06-18 LAB — CUP PACEART REMOTE DEVICE CHECK
Battery Remaining Longevity: 60 mo
Battery Remaining Percentage: 67 %
Brady Statistic RA Percent Paced: 55 %
Brady Statistic RV Percent Paced: 92 %
Date Time Interrogation Session: 20250528041000
HighPow Impedance: 82 Ohm
Lead Channel Impedance Value: 544 Ohm
Lead Channel Impedance Value: 564 Ohm
Lead Channel Impedance Value: 734 Ohm
Lead Channel Setting Pacing Amplitude: 2 V
Lead Channel Setting Pacing Amplitude: 2.5 V
Lead Channel Setting Pacing Amplitude: 2.6 V
Lead Channel Setting Pacing Pulse Width: 0.4 ms
Lead Channel Setting Pacing Pulse Width: 0.4 ms
Lead Channel Setting Sensing Sensitivity: 0.5 mV
Lead Channel Setting Sensing Sensitivity: 1 mV
Pulse Gen Serial Number: 204176
Zone Setting Status: 755011

## 2023-06-19 ENCOUNTER — Ambulatory Visit (INDEPENDENT_AMBULATORY_CARE_PROVIDER_SITE_OTHER): Admitting: Internal Medicine

## 2023-06-19 ENCOUNTER — Encounter: Payer: Self-pay | Admitting: Internal Medicine

## 2023-06-19 VITALS — BP 110/62 | HR 60 | Temp 98.9°F | Ht 61.0 in | Wt 110.0 lb

## 2023-06-19 DIAGNOSIS — E785 Hyperlipidemia, unspecified: Secondary | ICD-10-CM

## 2023-06-19 DIAGNOSIS — R739 Hyperglycemia, unspecified: Secondary | ICD-10-CM | POA: Diagnosis not present

## 2023-06-19 DIAGNOSIS — S22060D Wedge compression fracture of T7-T8 vertebra, subsequent encounter for fracture with routine healing: Secondary | ICD-10-CM

## 2023-06-19 DIAGNOSIS — I10 Essential (primary) hypertension: Secondary | ICD-10-CM | POA: Diagnosis not present

## 2023-06-19 DIAGNOSIS — E559 Vitamin D deficiency, unspecified: Secondary | ICD-10-CM | POA: Diagnosis not present

## 2023-06-19 DIAGNOSIS — M810 Age-related osteoporosis without current pathological fracture: Secondary | ICD-10-CM | POA: Diagnosis not present

## 2023-06-19 DIAGNOSIS — E538 Deficiency of other specified B group vitamins: Secondary | ICD-10-CM | POA: Diagnosis not present

## 2023-06-19 DIAGNOSIS — J449 Chronic obstructive pulmonary disease, unspecified: Secondary | ICD-10-CM | POA: Diagnosis not present

## 2023-06-19 MED ORDER — OXYCODONE-ACETAMINOPHEN 7.5-325 MG PO TABS: 1.0000 | ORAL_TABLET | Freq: Four times a day (QID) | ORAL | 0 refills | Status: DC | PRN

## 2023-06-19 NOTE — Patient Instructions (Addendum)
 Please continue all other medications as before, and refills have been done for oxycodone   We will ask Iona Manis RN in the office to check on you starting Prolia  Please have the pharmacy call with any other refills you may need.  Please continue your efforts at being more active, low cholesterol diet, and weight control.  Please keep your appointments with your specialists as you may have planned  Please make an Appointment to return in 3 months, or sooner if needed, also with Lab Appointment for testing done 3-5 days before at the FIRST FLOOR Lab (so this is for TWO appointments - please see the scheduling desk as you leave)

## 2023-06-19 NOTE — Progress Notes (Signed)
 Patient ID: Kimberly [name], female   DOB: 28-Jan-1945, 77 y.o.   MRN: [id number]        Chief Complaint: follow up recent compression fracture feb 9       HPI:  Kimberly [name] is a 78 y.o. female here with above, struggling ever since, followed per NS, could not tolerate torso brace with sleeping at night, no kyphosis done to date, Percocet 7.5 getting her by at about one per day.  Remains on chronic prednisone ,   Has hx of severe osteoporosis, last DXA with worst T score -4.2 lumbar spine in 2018.  No current tx.  Has not tried prolia.  This episode occurred with a sneeze.  Pt denies chest pain, increased sob or doe, wheezing, orthopnea, PND, increased LE swelling, palpitations, dizziness or syncope.   Pt denies polydipsia, polyuria, or new focal neuro s/s.    Pt denies fever, wt loss, night sweats, loss of appetite, or other constitutional symptoms         Wt Readings from Last 3 Encounters:  06/19/23 110 lb (49.9 kg)  05/25/23 107 lb (48.5 kg)  04/22/23 107 lb (48.5 kg)   BP Readings from Last 3 Encounters:  06/19/23 110/62  05/25/23 (!) 90/52  04/22/23 (!) 94/42         Past Medical History:  Diagnosis Date   AICD (automatic cardioverter/defibrillator) present 03/17/2017   Anxiety    Atrial tachycardia (HCC)    Basal cell carcinoma    R ant thigh, L pretibia, both txted at [facility]   Bursitis of shoulder, right, adhesive    Cancer (HCC)    CHF (congestive heart failure) (HCC)    Chronic bronchitis (HCC)    "1-2 times/yr" (01/23/2014)   COPD (chronic obstructive pulmonary disease) (HCC)    CVD (cerebrovascular disease)    Dyslipidemia    Dysrhythmia    Frequency of urination    GERD (gastroesophageal reflux disease)    Heart murmur    History of stomach ulcers    HTN (hypertension) 02/22/2011   Migraines    "stopped many years ago" (06/14/2014)   Osteoporosis 08/19/2016   Pericarditis    Pneumonia "10 times" (06/14/2014)   Right ventricular outflow tract premature  ventricular contractions (PVCs)    Silent myocardial infarction (HCC) "late 1990's"   Stress incontinence    "was suppose to have been tacked up years ago but I didn't do it"   Syncope, near    Associated with atrial tachycardia-event recorder 1/16   Thoracic outlet syndrome    VSD (ventricular septal defect)    Past Surgical History:  Procedure Laterality Date   ABDOMINAL AORTOGRAM W/LOWER EXTREMITY N/A 12/17/2016   Procedure: ABDOMINAL AORTOGRAM W/LOWER EXTREMITY;  Surgeon: Wenona Hamilton, MD;  [facility] INVAS[facility] CV LAB;  Service: Cardiovascular;  Laterality: N/A;   BIV ICD INSERTION CRT-D N/A 03/17/2017   Procedure: BIV ICD INSERTION CRT-D;  Surgeon: Tammie Fall, MD;  Location: [facility] INVASIVE CV LAB;  Service: Cardiovascular;  Laterality: N/A;   CARDIAC CATHETERIZATION  "quite a few"   CESAREAN SECTION  1972   CHOLECYSTECTOMY OPEN  1970's   CORONARY ANGIOGRAM  2000   No significant CAD   ELECTROPHYSIOLOGIC STUDY N/A 06/14/2014   Procedure: A-Flutter/A-Tach/SVT Ablation;  Surgeon: Tammie Fall, MD;  Location: [facility] INVASIVE CV LAB;  Service: Cardiovascular;  Laterality: N/A;   INSERTION OF ICD  03/17/2017   BIV   MULTIPLE TOOTH EXTRACTIONS     MYRINGOTOMY  WITH TUBE PLACEMENT Right 2015   PERIPHERAL VASCULAR INTERVENTION  12/17/2016   Procedure: PERIPHERAL VASCULAR INTERVENTION;  Surgeon: Wenona Hamilton, MD;  Location: [facility] INVASIVE CV LAB;  Service: Cardiovascular;;  Right common femoral PTA and Stent   RIGHT/LEFT HEART CATH AND CORONARY ANGIOGRAPHY Bilateral 11/06/2016   Procedure: RIGHT/LEFT HEART CATH AND CORONARY ANGIOGRAPHY;  Surgeon: Wenona Hamilton, MD;  Location: [facility] INVASIVE CV LAB;  Service: Cardiovascular;  Laterality: Bilateral;   SVT ABLATION  06/14/2014   TUBAL LIGATION  1972   VSD REPAIR  1958; 1967    reports that she has quit smoking. Her smoking use included cigarettes. She has a 17.5 pack-year smoking history. She has never used smokeless tobacco. She  reports that she does not currently use alcohol after a past usage of about 1.0 standard drink of alcohol per week. She reports that she does not use drugs. family history includes Alcohol abuse in an other family member; Arthritis in some other family members; Cancer in her father, mother, and another family member; Hypertension in some other family members; Stroke in some other family members; Throat cancer in an other family member. Allergies  Allergen Reactions   Mupirocin  Shortness Of Breath and Other (See Comments)    Burning, pain, swelling and sob   Codeine Nausea Only   Current Outpatient Medications on File Prior to Visit  Medication Sig Dispense Refill   albuterol  (PROVENTIL ) (2.5 MG/3ML) 0.083% nebulizer solution Take 3 mLs (2.5 mg total) by nebulization every 6 (six) hours as needed for wheezing or shortness of breath. TAKE 3 MLS (1 VIAL) BY NEBULIZATION EVERY 6 HOURS AS NEEDED FOR WHEEZING OR SHORTNESS OF BREATH 90 mL 5   albuterol  (VENTOLIN  HFA) 108 (90 Base) MCG/ACT inhaler Inhale 2 puffs into the lungs every 6 (six) hours as needed for wheezing or shortness of breath. 8.5 g 5   ALPRAZolam  (XANAX ) 0.5 MG tablet TAKE 1 TABLET BY MOUTH TWICE PER DAY AS NEEDED 60 tablet 5   aspirin  81 MG tablet Take 1 tablet (81 mg total) by mouth daily. 100 tablet 99   bisoprolol  (ZEBETA ) 5 MG tablet TAKE 1/2 TABLET (2.5 MG TOTAL) BY MOUTH DAILY 45 tablet 1   Budeson-Glycopyrrol-Formoterol  (BREZTRI  AEROSPHERE) 160-9-4.8 MCG/ACT AERO Inhale 2 puffs into the lungs in the morning and at bedtime. 1 each 11   budesonide  (PULMICORT ) 0.25 MG/2ML nebulizer solution One vial twice daily thru nebulizer with albuterol  60 mL 12   digoxin  (LANOXIN ) 0.125 MG tablet TAKE 1/2 TABLET (62.5 MCG TOTAL) BY MOUTH DAILY 45 tablet 3   furosemide  (LASIX ) 20 MG tablet Take 2 tablets (40 mg total) by mouth 2 (two) times daily. 120 tablet 3   predniSONE  (DELTASONE ) 10 MG tablet TAKE 1 TABLET BY MOUTH DAILY WITH BREAKFAST 30  tablet 1   rosuvastatin  (CRESTOR ) 10 MG tablet Take 1 tablet (10 mg total) by mouth daily. 90 tablet 3   sacubitril -valsartan  (ENTRESTO ) 24-26 MG Take 1 tablet by mouth 2 (two) times daily. 60 tablet 11   spironolactone  (ALDACTONE ) 25 MG tablet Take 1 tablet (25 mg total) by mouth at bedtime. 30 tablet 6   tizanidine  (ZANAFLEX ) 2 MG capsule Take 2 mg by mouth as needed for muscle spasms.     No current facility-administered medications on file prior to visit.        ROS:  All others reviewed and negative.  Objective        PE:  BP 110/62 (BP Location: Left Arm, Patient Position:  Sitting, Cuff Size: Normal)   Pulse 60   Temp 98.9 F (37.2 C) (Oral)   Ht 5\' 1"  (1.549 m)   Wt 110 lb (49.9 kg)   SpO2 98%   BMI 20.78 kg/m                 Constitutional: Pt appears in NAD               HENT: Head: NCAT.                Right Ear: External ear normal.                 Left Ear: External ear normal.                Eyes: . Pupils are equal, round, and reactive to light. Conjunctivae and EOM are normal               Nose: without d/c or deformity               Neck: Neck supple. Gross normal ROM               Cardiovascular: Normal rate and regular rhythm.                 Pulmonary/Chest: Effort normal and breath sounds without rales or wheezing.                Abd:  Soft, NT, ND, + BS, no organomegaly               Neurological: Pt is alert. At baseline orientation, motor grossly intact               Skin: Skin is warm. No rashes, no other new lesions, LE edema - none               Psychiatric: Pt behavior is normal without agitation   Micro: none  Cardiac tracings I have personally interpreted today:  none  Pertinent Radiological findings (summarize): none   Lab Results  Component Value Date   WBC 9.8 03/01/2023   HGB 12.9 03/01/2023   HCT 39.2 03/01/2023   PLT 311 03/01/2023   GLUCOSE 107 (H) 03/26/2023   CHOL 200 02/26/2023   TRIG 80 02/26/2023   HDL 118 02/26/2023    LDLDIRECT 112.6 03/16/2013   LDLCALC 66 02/26/2023   ALT 22 03/01/2023   AST 23 03/01/2023   NA 134 (L) 03/26/2023   K 5.1 03/26/2023   CL 98 03/26/2023   CREATININE 1.33 (H) 03/26/2023   BUN 26 (H) 03/26/2023   CO2 25 03/26/2023   TSH 0.86 07/30/2022   INR 1.02 12/15/2016   HGBA1C 5.8 07/30/2022   MICROALBUR 4.5 (H) 07/30/2022   Assessment/Plan:  Kimberly [name] is a 78 y.o. White or Caucasian [1] female with  has a past medical history of AICD (automatic cardioverter/defibrillator) present (03/17/2017), Anxiety, Atrial tachycardia (HCC), Basal cell carcinoma, Bursitis of shoulder, right, adhesive, Cancer (HCC), CHF (congestive heart failure) (HCC), Chronic bronchitis (HCC), COPD (chronic obstructive pulmonary disease) (HCC), CVD (cerebrovascular disease), Dyslipidemia, Dysrhythmia, Frequency of urination, GERD (gastroesophageal reflux disease), Heart murmur, History of stomach ulcers, HTN (hypertension) (02/22/2011), Migraines, Osteoporosis (08/19/2016), Pericarditis, Pneumonia ("10 times" (06/14/2014)), 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), Silent myocardial infarction (HCC) ("late 1990's"), Stress incontinence, Syncope, near, Thoracic outlet syndrome, and VSD (ventricular septal defect).  Dyslipidemia Lab Results  Component Value Date   LDLCALC 66 02/26/2023  Stable, pt to continue current statin crestor  10 qd   HTN (hypertension) BP Readings from Last 3 Encounters:  06/19/23 110/62  05/25/23 (!) 90/52  04/22/23 (!) 94/42   Stable, pt to continue medical treatment zebeta  5 mg every day,    Hyperglycemia Lab Results  Component Value Date   HGBA1C 5.8 07/30/2022   Stable, pt to continue current medical treatment  - diet, wt control   B12 deficiency Lab Results  Component Value Date   VITAMINB12 163 (L) 07/30/2022   Low, to start oral replacement - b12 1000 mcg qd   Vitamin D  deficiency Last vitamin D  Lab Results  Component  Value Date   VD25OH 22.88 (L) 07/30/2022   Low, to start oral replacement   COPD (chronic obstructive pulmonary disease) (HCC) Stable, cont inhaler prn  Osteoporosis Severe, for prolia start - will ask office to arrange  Wedge compression fracture of T7 vertebra with routine healing Ok for refill percocet 7.5 mg prn, will need pain management if unable to wean  Followup: Return in about 3 months (around 09/19/2023).  Rosalia Colonel, MD 06/21/2023 10:22 AM [facility]

## 2023-06-21 ENCOUNTER — Encounter: Payer: Self-pay | Admitting: Internal Medicine

## 2023-06-21 NOTE — Assessment & Plan Note (Signed)
Lab Results  Component Value Date   HGBA1C 5.8 07/30/2022   Stable, pt to continue current medical treatment  - diet, wt control

## 2023-06-21 NOTE — Assessment & Plan Note (Signed)
Last vitamin D Lab Results  Component Value Date   VD25OH 22.88 (L) 07/30/2022   Low, to start oral replacement

## 2023-06-21 NOTE — Assessment & Plan Note (Signed)
 Lab Results  Component Value Date   LDLCALC 66 02/26/2023   Stable, pt to continue current statin crestor  10 qd

## 2023-06-21 NOTE — Assessment & Plan Note (Signed)
 Severe, for prolia start - will ask office to arrange

## 2023-06-21 NOTE — Assessment & Plan Note (Signed)
 BP Readings from Last 3 Encounters:  06/19/23 110/62  05/25/23 (!) 90/52  04/22/23 (!) 94/42   Stable, pt to continue medical treatment zebeta  5 mg every day,

## 2023-06-21 NOTE — Assessment & Plan Note (Signed)
 Lab Results  Component Value Date   VITAMINB12 163 (L) 07/30/2022   Low, to start oral replacement - b12 1000 mcg qd

## 2023-06-21 NOTE — Assessment & Plan Note (Signed)
 Stable , cont inhaler prn

## 2023-06-21 NOTE — Assessment & Plan Note (Signed)
 Ok for refill percocet 7.5 mg prn, will need pain management if unable to wean

## 2023-06-24 ENCOUNTER — Ambulatory Visit

## 2023-06-25 ENCOUNTER — Ambulatory Visit: Payer: Self-pay | Admitting: Internal Medicine

## 2023-06-26 ENCOUNTER — Telehealth: Payer: Self-pay | Admitting: Orthopedic Surgery

## 2023-06-26 NOTE — Telephone Encounter (Signed)
 Left message for patient to call our office back.   Media Information  Document Information  AMB Correspondence  NEUROSURGERY ANSWERING SERVICE MESSAGE  06/25/2023 08:16  Attached To:  Kimberly [name]  Source Information  Default, Provider, MD  Document History

## 2023-06-29 ENCOUNTER — Ambulatory Visit: Payer: Medicare Other | Admitting: Internal Medicine

## 2023-07-03 ENCOUNTER — Ambulatory Visit: Attending: Cardiovascular Disease | Admitting: Cardiovascular Disease

## 2023-07-03 VITALS — BP 82/40 | HR 60 | Ht 61.0 in | Wt 112.8 lb

## 2023-07-03 DIAGNOSIS — E785 Hyperlipidemia, unspecified: Secondary | ICD-10-CM

## 2023-07-03 DIAGNOSIS — I5022 Chronic systolic (congestive) heart failure: Secondary | ICD-10-CM | POA: Diagnosis not present

## 2023-07-03 DIAGNOSIS — Z720 Tobacco use: Secondary | ICD-10-CM

## 2023-07-03 DIAGNOSIS — I739 Peripheral vascular disease, unspecified: Secondary | ICD-10-CM

## 2023-07-03 NOTE — Progress Notes (Signed)
 Cardiology Office Note   Date:  07/03/2023   ID:  Andriea, Hasegawa [birth year], MRN [id number]  PCP:  Roslyn Coombe, MD  Cardiologist: Dr. Avanell Bob  No chief complaint on file.     History of Present Illness: ERINA HAMME is a 78 y.o. female who is here today for a follow-up visit regarding peripheral arterial disease.   She is followed by me for peripheral arterial disease and followed closely at the heart failure clinic.  She has history of congenital heart disease status post VSD repair with multiple prior catheterizations via bilateral femoral arteries when she was a child, SVT status post ablation, hyperlipidemia, COPD, tobacco use, transient atrial fibrillation and chronic systolic heart failure. Patient was seen in 2018 for peripheral arterial disease with nonhealing wound on the right lateral leg above the ankle. She underwent recent noninvasive vascular evaluation which showed a right ABI of 0.62 and left of 0.72. Angiography in 2018 showed moderate left common iliac artery stenosis, short occlusion of right common femoral artery with no significant infrainguinal disease and short occlusion of the left common femoral artery with no significant infrainguinal disease.  I performed successful drug-coated balloon angioplasty and self-expanding stent placement to the right common femoral artery without complications.  She had no recurrent wounds since then. She developed severe restenosis in the right common femoral artery but remained asymptomatic and thus has been monitored closely.   She was hospitalized in November with pneumonia and COPD exacerbation.  She quit smoking after that hospitalization.  She had recent lower extremity arterial Doppler which showed a drop in ABI on the right side with evidence of right external iliac artery stenosis.  In spite of that, she denies claudication and has no lower extremity ulceration.  No chest pain.  Shortness of breath is stable.  Past  Medical History:  Diagnosis Date   AICD (automatic cardioverter/defibrillator) present 03/17/2017   Anxiety    Atrial tachycardia (HCC)    Basal cell carcinoma    R ant thigh, L pretibia, both txted at [facility]   Bursitis of shoulder, right, adhesive    Cancer (HCC)    CHF (congestive heart failure) (HCC)    Chronic bronchitis (HCC)    1-2 times/yr (01/23/2014)   COPD (chronic obstructive pulmonary disease) (HCC)    CVD (cerebrovascular disease)    Dyslipidemia    Dysrhythmia    Frequency of urination    GERD (gastroesophageal reflux disease)    Heart murmur    History of stomach ulcers    HTN (hypertension) 02/22/2011   Migraines    stopped many years ago (06/14/2014)   Osteoporosis 08/19/2016   Pericarditis    Pneumonia 10 times (06/14/2014)   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)    Silent myocardial infarction [facility]) late 1990's   Stress incontinence    was suppose to have been tacked up years ago but I didn't do it   Syncope, near    Associated with atrial tachycardia-event recorder 1/16   Thoracic outlet syndrome    VSD (ventricular septal defect)     Past Surgical History:  Procedure Laterality Date   ABDOMINAL AORTOGRAM W/LOWER EXTREMITY N/A 12/17/2016   Procedure: ABDOMINAL AORTOGRAM W/LOWER EXTREMITY;  Surgeon: Wenona Hamilton, MD;  Location: [facility] INVASIVE CV LAB;  Service: Cardiovascular;  Laterality: N/A;   BIV ICD INSERTION CRT-D N/A 03/17/2017   Procedure: BIV ICD INSERTION CRT-D;  Surgeon: Tammie Fall, MD;  Location:  [facility] INVASIVE CV LAB;  Service: Cardiovascular;  Laterality: N/A;   CARDIAC CATHETERIZATION  quite a few   CESAREAN SECTION  1972   CHOLECYSTECTOMY OPEN  1970's   CORONARY ANGIOGRAM  2000   No significant CAD   ELECTROPHYSIOLOGIC STUDY N/A 06/14/2014   Procedure: A-Flutter/A-Tach/SVT Ablation;  Surgeon: Tammie Fall, MD;  Location: [facility] INVASIVE CV LAB;  Service: Cardiovascular;   Laterality: N/A;   INSERTION OF ICD  03/17/2017   BIV   MULTIPLE TOOTH EXTRACTIONS     MYRINGOTOMY WITH TUBE PLACEMENT Right 2015   PERIPHERAL VASCULAR INTERVENTION  12/17/2016   Procedure: PERIPHERAL VASCULAR INTERVENTION;  Surgeon: Wenona Hamilton, MD;  Location: [facility] INVASIVE CV LAB;  Service: Cardiovascular;;  Right common femoral PTA and Stent   RIGHT/LEFT HEART CATH AND CORONARY ANGIOGRAPHY Bilateral 11/06/2016   Procedure: RIGHT/LEFT HEART CATH AND CORONARY ANGIOGRAPHY;  Surgeon: Wenona Hamilton, MD;  Location: [facility] INVASIVE CV LAB;  Service: Cardiovascular;  Laterality: Bilateral;   SVT ABLATION  06/14/2014   TUBAL LIGATION  1972   VSD REPAIR  1958; 1967     Current Outpatient Medications  Medication Sig Dispense Refill   albuterol  (PROVENTIL ) (2.5 MG/3ML) 0.083% nebulizer solution Take 3 mLs (2.5 mg total) by nebulization every 6 (six) hours as needed for wheezing or shortness of breath. TAKE 3 MLS (1 VIAL) BY NEBULIZATION EVERY 6 HOURS AS NEEDED FOR WHEEZING OR SHORTNESS OF BREATH 90 mL 5   albuterol  (VENTOLIN  HFA) 108 (90 Base) MCG/ACT inhaler Inhale 2 puffs into the lungs every 6 (six) hours as needed for wheezing or shortness of breath. 8.5 g 5   ALPRAZolam  (XANAX ) 0.5 MG tablet TAKE 1 TABLET BY MOUTH TWICE PER DAY AS NEEDED 60 tablet 5   aspirin  81 MG tablet Take 1 tablet (81 mg total) by mouth daily. 100 tablet 99   bisoprolol  (ZEBETA ) 5 MG tablet TAKE 1/2 TABLET (2.5 MG TOTAL) BY MOUTH DAILY 45 tablet 1   Budeson-Glycopyrrol-Formoterol  (BREZTRI  AEROSPHERE) 160-9-4.8 MCG/ACT AERO Inhale 2 puffs into the lungs in the morning and at bedtime. 1 each 11   budesonide  (PULMICORT ) 0.25 MG/2ML nebulizer solution One vial twice daily thru nebulizer with albuterol  60 mL 12   digoxin  (LANOXIN ) 0.125 MG tablet TAKE 1/2 TABLET (62.5 MCG TOTAL) BY MOUTH DAILY 45 tablet 3   furosemide  (LASIX ) 20 MG tablet Take 2 tablets (40 mg total) by mouth 2 (two) times daily. 120 tablet 3    oxyCODONE -acetaminophen  (PERCOCET) 7.5-325 MG tablet Take 1 tablet by mouth every 6 (six) hours as needed for severe pain (pain score 7-10). 30 tablet 0   predniSONE  (DELTASONE ) 10 MG tablet TAKE 1 TABLET BY MOUTH DAILY WITH BREAKFAST 30 tablet 1   rosuvastatin  (CRESTOR ) 10 MG tablet Take 1 tablet (10 mg total) by mouth daily. 90 tablet 3   sacubitril -valsartan  (ENTRESTO ) 24-26 MG Take 1 tablet by mouth 2 (two) times daily. 60 tablet 11   spironolactone  (ALDACTONE ) 25 MG tablet Take 1 tablet (25 mg total) by mouth at bedtime. 30 tablet 6   tizanidine  (ZANAFLEX ) 2 MG capsule Take 2 mg by mouth as needed for muscle spasms.     No current facility-administered medications for this visit.    Allergies:   Mupirocin  and Codeine    Social History:  The patient  reports that she has quit smoking. Her smoking use included cigarettes. She has a 17.5 pack-year smoking history. She has never used smokeless tobacco. She reports that she does not currently  use alcohol after a past usage of about 1.0 standard drink of alcohol per week. She reports that she does not use drugs.   Family History:  The patient's family history includes Alcohol abuse in an other family member; Arthritis in some other family members; Cancer in her father, mother, and another family member; Hypertension in some other family members; Stroke in some other family members; Throat cancer in an other family member.    ROS:  Please see the history of present illness.   Otherwise, review of systems are positive for none.   All other systems are reviewed and negative.    PHYSICAL EXAM: VS:  BP (!) 82/40 (BP Location: Left Arm, Patient Position: Sitting, Cuff Size: Normal)   Pulse 60   Ht 5' 1 (1.549 m)   Wt 112 lb 12.8 oz (51.2 kg)   SpO2 100%   BMI 21.31 kg/m  , BMI Body mass index is 21.31 kg/m. GEN: Well nourished, well developed, in no acute distress  HEENT: normal  Neck: no JVD, carotid bruits, or masses Cardiac: RRR; no   rubs, or gallops,no edema. 2/f 6 holosystolic murmur at the left sternal border and apex. Respiratory:  clear to auscultation bilaterally, normal work of breathing GI: soft, nontender, nondistended, + BS MS: no deformity or atrophy  Skin: warm and dry, no rash Neuro:  Strength and sensation are intact Psych: euthymic mood, full affect Vascular: Radial pulse is absent bilaterally.    EKG:  EKG is not ordered today.    Recent Labs: 07/30/2022: TSH 0.86 12/22/2022: Magnesium  2.1 02/26/2023: B Natriuretic Peptide 604.8 03/01/2023: ALT 22; Hemoglobin 12.9; Platelets 311 03/26/2023: BUN 26; Creatinine, Ser 1.33; Potassium 5.1; Sodium 134    Lipid Panel    Component Value Date/Time   CHOL 200 02/26/2023 1440   TRIG 80 02/26/2023 1440   HDL 118 02/26/2023 1440   CHOLHDL 1.7 02/26/2023 1440   VLDL 16 02/26/2023 1440   LDLCALC 66 02/26/2023 1440   LDLDIRECT 112.6 03/16/2013 1057      Wt Readings from Last 3 Encounters:  07/03/23 112 lb 12.8 oz (51.2 kg)  06/19/23 110 lb (49.9 kg)  05/25/23 107 lb (48.5 kg)           No data to display            ASSESSMENT AND PLAN:  1.  Peripheral arterial disease .  Status post endovascular revascularization of the right common femoral artery .  There is no evidence of severe stenosis in the right external iliac artery.  However, she is asymptomatic.  Thus, no indication for revascularization.  I discussed with her the importance of regular walking.  I asked her to report any new symptoms.     2.  Chronic systolic heart failure: Due to nonischemic cardiomyopathy.  She appears to be euvolemic and currently on optimal medical therapy followed at the heart failure clinic.  She is mildly hypotensive but is completely asymptomatic.   3.  Hyperlipidemia: Continue treatment with rosuvastatin  with a target LDL of less than 70.  Most recent lipid profile showed an LDL of 66.   4.  Tobacco use: I congratulated her on smoking  cessation.     Disposition:   FU with me in 6 months  Signed,  Antionette Kirks, MD  07/03/2023 10:26 AM    [facility]

## 2023-07-03 NOTE — Patient Instructions (Signed)
 Medication Instructions:  No changes *If you need a refill on your cardiac medications before your next appointment, please call your pharmacy*  Lab Work: None ordered If you have labs (blood work) drawn today and your tests are completely normal, you will receive your results only by: MyChart Message (if you have MyChart) OR A paper copy in the mail If you have any lab test that is abnormal or we need to change your treatment, we will call you to review the results.  Testing/Procedures: None ordered  Follow-Up: At [facility], you and your health needs are our priority.  As part of our continuing mission to provide you with exceptional heart care, our providers are all part of one team.  This team includes your primary Cardiologist (physician) and Advanced Practice Providers or APPs (Physician Assistants and Nurse Practitioners) who all work together to provide you with the care you need, when you need it.  Your next appointment:   6 month(s)  Provider:   You may see Dr. Alvenia Aus or one of the following Advanced Practice Providers on your designated Care Team:   Laneta Pintos, NP Gildardo Labrador, PA-C Varney Gentleman, PA-C Cadence [place], PA-C Ronald Cockayne, NP Morey Ar, NP    We recommend signing up for the patient portal called MyChart.  Sign up information is provided on this After Visit Summary.  MyChart is used to connect with patients for Virtual Visits (Telemedicine).  Patients are able to view lab/test results, encounter notes, upcoming appointments, etc.  Non-urgent messages can be sent to your provider as well.   To learn more about what you can do with MyChart, go to [email or web address].

## 2023-07-03 NOTE — Progress Notes (Deleted)
 Referring Physician:  Roslyn Coombe, MD [address] [place],  [place] [address]  Primary Physician:  Roslyn Coombe, MD  History of Present Illness: [name] has a history of repair of VSD x 2, HTN, PVD, dilated cardiomyopathy, chronic systolic CHF, severe COPD, GERD, hepatic cirrhosis, osteoporosis, dyslipidemia, ICD.   She is on 2L O2 prn.   She has an implantable cardioverter-defibrillator.   Last seen by me on 05/25/23 for follow up of T7 compression fracture. She was doing well and was released.   She is here with new complaints of neck pain.        Last seen by me on 04/22/23 for T7 compression fracture that likely occurred around 02/27/23.   TLSO brace was exacerbating her pulmonary issues. She told at last visit that she could move brace as needed. No bending, twisting, or lifting.   She is here for follow up and repeat xrays.   She is doing well. Had a twinge of back pain when she bend over to pick something up last week. No arm or leg pain. No numbness, tingling, or weakness.   Anxiety has been better since stopping her TLSO brace.   She takes prn percocet (from PCP) rarely. She is on chronic prednisone .       She is not smoking.   No bowel or bladder issues.   Past Surgery: none  The symptoms are causing a significant impact on the patient's life.   Review of Systems:  A 10 point review of systems is negative, except for the pertinent positives and negatives detailed in the HPI.  Past Medical History: Past Medical History:  Diagnosis Date   AICD (automatic cardioverter/defibrillator) present 03/17/2017   Anxiety    Atrial tachycardia (HCC)    Basal cell carcinoma    R ant thigh, L pretibia, both txted at [facility]   Bursitis of shoulder, right, adhesive    Cancer (HCC)    CHF (congestive heart failure) (HCC)    Chronic bronchitis (HCC)    1-2 times/yr (01/23/2014)   COPD (chronic obstructive pulmonary disease) (HCC)    CVD  (cerebrovascular disease)    Dyslipidemia    Dysrhythmia    Frequency of urination    GERD (gastroesophageal reflux disease)    Heart murmur    History of stomach ulcers    HTN (hypertension) 02/22/2011   Migraines    stopped many years ago (06/14/2014)   Osteoporosis 08/19/2016   Pericarditis    Pneumonia 10 times (06/14/2014)   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)    Silent myocardial infarction [facility]) late 1990's   Stress incontinence    was suppose to have been tacked up years ago but I didn't do it   Syncope, near    Associated with atrial tachycardia-event recorder 1/16   Thoracic outlet syndrome    VSD (ventricular septal defect)     Past Surgical History: Past Surgical History:  Procedure Laterality Date   ABDOMINAL AORTOGRAM W/LOWER EXTREMITY N/A 12/17/2016   Procedure: ABDOMINAL AORTOGRAM W/LOWER EXTREMITY;  Surgeon: Wenona Hamilton, MD;  Location: [facility] INVASIVE CV LAB;  Service: Cardiovascular;  Laterality: N/A;   BIV ICD INSERTION CRT-D N/A 03/17/2017   Procedure: BIV ICD INSERTION CRT-D;  Surgeon: Tammie Fall, MD;  Location: [facility] INVASIVE CV LAB;  Service: Cardiovascular;  Laterality: N/A;   CARDIAC CATHETERIZATION  quite a few   CESAREAN SECTION  1972   CHOLECYSTECTOMY OPEN  1970's   CORONARY ANGIOGRAM  2000   No significant CAD   ELECTROPHYSIOLOGIC STUDY N/A 06/14/2014   Procedure: A-Flutter/A-Tach/SVT Ablation;  Surgeon: Tammie Fall, MD;  Location: [facility] INVASIVE CV LAB;  Service: Cardiovascular;  Laterality: N/A;   INSERTION OF ICD  03/17/2017   BIV   MULTIPLE TOOTH EXTRACTIONS     MYRINGOTOMY WITH TUBE PLACEMENT Right 2015   PERIPHERAL VASCULAR INTERVENTION  12/17/2016   Procedure: PERIPHERAL VASCULAR INTERVENTION;  Surgeon: Wenona Hamilton, MD;  Location: [facility] INVASIVE CV LAB;  Service: Cardiovascular;;  Right common femoral PTA and Stent   RIGHT/LEFT HEART CATH AND CORONARY ANGIOGRAPHY Bilateral 11/06/2016    Procedure: RIGHT/LEFT HEART CATH AND CORONARY ANGIOGRAPHY;  Surgeon: Wenona Hamilton, MD;  Location: [facility] INVASIVE CV LAB;  Service: Cardiovascular;  Laterality: Bilateral;   SVT ABLATION  06/14/2014   TUBAL LIGATION  1972   VSD REPAIR  1958; 1967    Allergies: Allergies as of 07/07/2023 - Review Complete 07/03/2023  Allergen Reaction Noted   Mupirocin  Shortness Of Breath and Other (See Comments) 08/26/2016   Codeine Nausea Only 11/13/2008    Medications: Outpatient Encounter Medications as of 07/07/2023  Medication Sig   albuterol  (PROVENTIL ) (2.5 MG/3ML) 0.083% nebulizer solution Take 3 mLs (2.5 mg total) by nebulization every 6 (six) hours as needed for wheezing or shortness of breath. TAKE 3 MLS (1 VIAL) BY NEBULIZATION EVERY 6 HOURS AS NEEDED FOR WHEEZING OR SHORTNESS OF BREATH   albuterol  (VENTOLIN  HFA) 108 (90 Base) MCG/ACT inhaler Inhale 2 puffs into the lungs every 6 (six) hours as needed for wheezing or shortness of breath.   ALPRAZolam  (XANAX ) 0.5 MG tablet TAKE 1 TABLET BY MOUTH TWICE PER DAY AS NEEDED   aspirin  81 MG tablet Take 1 tablet (81 mg total) by mouth daily.   bisoprolol  (ZEBETA ) 5 MG tablet TAKE 1/2 TABLET (2.5 MG TOTAL) BY MOUTH DAILY   Budeson-Glycopyrrol-Formoterol  (BREZTRI  AEROSPHERE) 160-9-4.8 MCG/ACT AERO Inhale 2 puffs into the lungs in the morning and at bedtime.   budesonide  (PULMICORT ) 0.25 MG/2ML nebulizer solution One vial twice daily thru nebulizer with albuterol    digoxin  (LANOXIN ) 0.125 MG tablet TAKE 1/2 TABLET (62.5 MCG TOTAL) BY MOUTH DAILY   furosemide  (LASIX ) 20 MG tablet Take 2 tablets (40 mg total) by mouth 2 (two) times daily.   oxyCODONE -acetaminophen  (PERCOCET) 7.5-325 MG tablet Take 1 tablet by mouth every 6 (six) hours as needed for severe pain (pain score 7-10).   predniSONE  (DELTASONE ) 10 MG tablet TAKE 1 TABLET BY MOUTH DAILY WITH BREAKFAST   rosuvastatin  (CRESTOR ) 10 MG tablet Take 1 tablet (10 mg total) by mouth daily.    sacubitril -valsartan  (ENTRESTO ) 24-26 MG Take 1 tablet by mouth 2 (two) times daily.   spironolactone  (ALDACTONE ) 25 MG tablet Take 1 tablet (25 mg total) by mouth at bedtime.   tizanidine  (ZANAFLEX ) 2 MG capsule Take 2 mg by mouth as needed for muscle spasms.   No facility-administered encounter medications on file as of 07/07/2023.    Social History: Social History   Tobacco Use   Smoking status: Former    Current packs/day: 0.50    Average packs/day: 0.5 packs/day for 35.0 years (17.5 ttl pk-yrs)    Types: Cigarettes   Smokeless tobacco: Never   Tobacco comments:    3-4 cigarettes daily. Trying to quit. HS.  Quit smoking 4 months ago.  Sneaks cigarettes.  12/31/2022 hfb  Vaping Use   Vaping status: Never Used  Substance Use Topics  Alcohol use: Not Currently    Alcohol/week: 1.0 standard drink of alcohol    Types: 1 Glasses of wine per week    Comment: 06/14/2014 glass of wine maybe once/month   Drug use: No    Family Medical History: Family History  Problem Relation Age of Onset   Cancer Mother    Cancer Father    Throat cancer Other    Hypertension Other    Stroke Other    Alcohol abuse Other    Arthritis Other    Cancer Other        lung cancer   Hypertension Other    Arthritis Other    Stroke Other    Breast cancer Neg Hx     Physical Examination: There were no vitals filed for this visit.  Awake, alert, oriented to person, place, and time.  Speech is clear and fluent. Fund of knowledge is appropriate.   Cranial Nerves: Pupils equal round and reactive to light.  Facial tone is symmetric.    Minimal tenderness over T7 region. ***  No abnormal lesions on exposed skin.   Strength: Side Biceps Triceps Deltoid Interossei Grip Wrist Ext. Wrist Flex.  R 5 5 5 5 5 5 5   L 5 5 5 5 5 5 5    Side Iliopsoas Quads Hamstring PF DF EHL  R 5 5 5 5 5 5   L 5 5 5 5 5 5    Reflexes are 2+ and symmetric at the biceps, brachioradialis, patella and achilles.    Hoffman's is absent.  Clonus is not present.   Bilateral upper and lower extremity sensation is intact to light touch.     Gait is normal.     Medical Decision Making  Imaging: none  Assessment and Plan: Ms. Criger had severe coughing fit  on 02/27/23 and was found to have T7 compression fracture during ED visit on 03/01/23.   She is doing well. Had a twinge of back pain when she bend over to pick something up last week. No arm or leg pain. No numbness, tingling, or weakness.   Xrays from today show stable T7 compression fracture in comparison to previous thoracic xrays.   Treatment options discussed with patient and following plan made:   - She can slowly return to activity as tolerated. Would still avoid any heavy lifting.  - Continue on prn percocet from PCP.  - Follow up with me prn.   Of note, her blood pressure was 90/52. She has documented low blood pressure. She denies any symptoms.   I spent a total of 15 minutes in face-to-face and non-face-to-face activities related to this patient's care today including review of outside records, review of imaging, review of symptoms, physical exam, discussion of differential diagnosis, discussion of treatment options, and documentation.   Lucetta Russel PA-C Dept. of Neurosurgery

## 2023-07-07 ENCOUNTER — Ambulatory Visit: Admitting: Orthopedic Surgery

## 2023-07-16 ENCOUNTER — Other Ambulatory Visit: Payer: Self-pay

## 2023-07-16 ENCOUNTER — Telehealth: Payer: Self-pay

## 2023-07-16 ENCOUNTER — Other Ambulatory Visit (HOSPITAL_COMMUNITY): Payer: Self-pay

## 2023-07-16 DIAGNOSIS — M810 Age-related osteoporosis without current pathological fracture: Secondary | ICD-10-CM

## 2023-07-16 MED ORDER — DENOSUMAB 60 MG/ML ~~LOC~~ SOSY: 60.0000 mg | PREFILLED_SYRINGE | Freq: Once | SUBCUTANEOUS | Status: DC

## 2023-07-16 NOTE — Telephone Encounter (Signed)
 Prolia VOB initiated via [email or web address]  Next Prolia inj DUE: NEW START   PHARMACY BENEFIT: $466.40

## 2023-07-16 NOTE — Telephone Encounter (Signed)
 SABRA

## 2023-07-17 NOTE — Telephone Encounter (Signed)
 Pt ready for scheduling for PROLIA  on or after : 07/17/23  Option# 2- Med Obtained from pharmacy:  Pharmacy benefit: Copay $466.40 (Paid to pharmacy) Admin Fee: $20 (Pay at clinic)  Prior Auth: N/A PA# Expiration Date:   # of doses approved:   If patient wants fill through the pharmacy benefit please send prescription to: [facility], and include estimated need by date in rx notes. Pharmacy will ship medication directly to the office.  Patient NOT eligible for Prolia  Copay Card. Copay Card can make patient's cost as little as $25. Link to apply: [email or web address]://www.amgensupportplus.[email or web address]/copay  ** This summary of benefits is an estimation of the patient's out-of-pocket cost. Exact cost may very based on individual plan coverage.

## 2023-07-17 NOTE — Telephone Encounter (Signed)
 Prior Authorization form/request asks a question that requires your assistance. Please see the question below and advise accordingly. The PA will not be submitted until the necessary information is received.       Patient has only failed oral bisphosphate. Insurance requires failure to BOTH an oral and IV bisphosphate for medical coverage.  Patient can get Prolia  through pharmacy benefit.

## 2023-07-21 NOTE — Telephone Encounter (Signed)
 I think this means the patient qualifies for Prolia  - ok to start please    thanks

## 2023-07-28 MED ORDER — ALENDRONATE SODIUM 70 MG PO TABS: 70.0000 mg | ORAL_TABLET | ORAL | 3 refills | Status: DC

## 2023-07-28 NOTE — Telephone Encounter (Signed)
 Patient must try and fail BOTH and oral and IV bisphosphate before insurance will cover Prolia .   She can try prolia  but will have to pay $486 ($66 to pharmacy and $20 to office) for each injection in 2025.   Please advise if patient can try an oral medication, and which one, before advancing to Prolia  injections.

## 2023-07-28 NOTE — Addendum Note (Signed)
 Addended by: [name] on: 07/28/2023 10:39 AM   Modules accepted: Orders

## 2023-07-28 NOTE — Telephone Encounter (Signed)
 Ok to let pt know [employer] will not pay for Prolia  unless we try a medication called Fosamax  70 mg weekly =   I have sent this erx

## 2023-07-29 NOTE — Telephone Encounter (Signed)
 Called and spoke with patient. Relayed to them information below and they expressed understanding. Won't be able to pick up fosamax  until Friday but will inform us  on how it is tolerated

## 2023-08-10 ENCOUNTER — Telehealth: Payer: Self-pay | Admitting: Internal Medicine

## 2023-08-10 NOTE — Telephone Encounter (Signed)
 PT called to ask questions about Breztri  pt assist application. I advised her and she will fax Attn: Dr. Darlean for him to complete and submit. I told her to keep a copy.

## 2023-08-10 NOTE — Addendum Note (Signed)
 Addended by: Meliton Samad A on: 08/10/2023 02:47 PM   Modules accepted: Orders

## 2023-08-10 NOTE — Progress Notes (Signed)
 Remote ICD transmission.

## 2023-08-11 ENCOUNTER — Encounter: Payer: Self-pay | Admitting: Internal Medicine

## 2023-08-12 MED ORDER — OXYCODONE-ACETAMINOPHEN 7.5-325 MG PO TABS: 1.0000 | ORAL_TABLET | Freq: Four times a day (QID) | ORAL | 0 refills | Status: DC | PRN

## 2023-08-17 ENCOUNTER — Telehealth: Payer: Self-pay | Admitting: Internal Medicine

## 2023-08-17 NOTE — Telephone Encounter (Signed)
 LVM for patient to call and discuss the [place] medication assistance application that we received via fax.  We did not receive the entire application--we are miss the section for the provider to complete.

## 2023-08-20 ENCOUNTER — Encounter: Payer: Self-pay | Admitting: Internal Medicine

## 2023-08-21 ENCOUNTER — Encounter: Payer: Self-pay | Admitting: Internal Medicine

## 2023-08-21 NOTE — Telephone Encounter (Signed)
 Ok for daughter in Social worker as new pt -

## 2023-09-01 ENCOUNTER — Telehealth: Payer: Self-pay | Admitting: Cardiology

## 2023-09-01 ENCOUNTER — Telehealth: Payer: Self-pay | Admitting: Internal Medicine

## 2023-09-01 NOTE — Telephone Encounter (Signed)
 PT came in for Breztri  samples she said someone told her they would have ready. Triage is going to bring them up. States we gave her the wrong ppwk to complete for the PT assistance so she was needing those samples.NFN

## 2023-09-01 NOTE — Telephone Encounter (Signed)
 Called to confirm/remind patient of their appointment at the [facility] on 09/02/23.   Appointment:   [x] Confirmed  [] Left mess   [] No answer/No voice mail  [] VM Full/unable to leave message  [] Phone not in service  Patient reminded to bring all medications and/or complete list.  Confirmed patient has transportation. Gave directions, instructed to utilize valet parking.

## 2023-09-02 ENCOUNTER — Encounter: Admitting: Cardiology

## 2023-09-07 ENCOUNTER — Telehealth: Payer: Self-pay | Admitting: Cardiology

## 2023-09-07 NOTE — Telephone Encounter (Signed)
 Called to confirm/remind patient of their appointment at the [facility] on 09/08/23.   Appointment:   [] Confirmed  [x] Left mess   [] No answer/No voice mail  [] VM Full/unable to leave message  [] Phone not in service  Patient reminded to bring all medications and/or complete list.  Confirmed patient has transportation. Gave directions, instructed to utilize valet parking.

## 2023-09-08 ENCOUNTER — Other Ambulatory Visit
Admission: RE | Admit: 2023-09-08 | Discharge: 2023-09-08 | Disposition: A | Discharge status: Home / Self Care | Source: Ambulatory Visit | Attending: Cardiology | Admitting: Cardiology

## 2023-09-08 ENCOUNTER — Ambulatory Visit: Admitting: Cardiology

## 2023-09-08 ENCOUNTER — Encounter: Payer: Self-pay | Admitting: Cardiology

## 2023-09-08 VITALS — BP 101/45 | HR 60 | Wt 110.0 lb

## 2023-09-08 DIAGNOSIS — I5022 Chronic systolic (congestive) heart failure: Secondary | ICD-10-CM | POA: Insufficient documentation

## 2023-09-08 LAB — BASIC METABOLIC PANEL WITH GFR
Anion gap: 9 (ref 5–15)
BUN: 32 mg/dL — ABNORMAL HIGH (ref 8–23)
CO2: 27 mmol/L (ref 22–32)
Calcium: 9.6 mg/dL (ref 8.9–10.3)
Chloride: 103 mmol/L (ref 98–111)
Creatinine, Ser: 1.22 mg/dL — ABNORMAL HIGH (ref 0.44–1.00)
GFR, Estimated: 46 mL/min — ABNORMAL LOW (ref >60)
Glucose, Bld: 108 mg/dL — ABNORMAL HIGH (ref 70–99)
Potassium: 4.4 mmol/L (ref 3.5–5.1)
Sodium: 139 mmol/L (ref 135–145)

## 2023-09-08 LAB — DIGOXIN LEVEL: Digoxin Level: 1.1 ng/mL (ref 0.8–2.0)

## 2023-09-08 LAB — BRAIN NATRIURETIC PEPTIDE: B Natriuretic Peptide: 336.6 pg/mL — ABNORMAL HIGH (ref 0.0–100.0)

## 2023-09-08 NOTE — Patient Instructions (Signed)
 Medication Changes:  No medication changes today!  Lab Work:  Go over to the [facility]. Go pass the gift shop and have your blood work completed.  We will only call you if the results are abnormal or if the provider would like to make medication changes.  No news is good news.   Testing/Procedures:  Your physician has requested that you have an echocardiogram. Echocardiography is a painless test that uses sound waves to create images of your heart. It provides your doctor with information about the size and shape of your heart and how well your heart's chambers and valves are working. This procedure takes approximately one hour. There are no restrictions for this procedure. Please do NOT wear cologne, perfume, aftershave, or lotions (deodorant is allowed). Please arrive 15 minutes prior to your appointment time.  Please note: We ask at that you not bring children with you during ultrasound (echo/ vascular) testing. Due to room size and safety concerns, children are not allowed in the ultrasound rooms during exams. Our front office staff cannot provide observation of children in our lobby area while testing is being conducted. An adult accompanying a patient to their appointment will only be allowed in the ultrasound room at the discretion of the ultrasound technician under special circumstances. We apologize for any inconvenience.   Follow-Up in: Please follow up with the [facility] in 3 months with Dr. Rolan. We do not currently have that schedule. Please give us  a call in October in order to schedule your appointment for November.    Thank you for choosing [facility].    At the [facility], you and your health needs are our priority. We have a designated team specialized in the treatment of Heart Failure. This Care Team includes your primary Heart Failure Specialized Cardiologist (physician), Advanced Practice  Providers (APPs- Physician Assistants and Nurse Practitioners), and Pharmacist who all work together to provide you with the care you need, when you need it.   You may see any of the following providers on your designated Care Team at your next follow up:  Dr. Toribio Fuel Dr. Ezra Rolan Dr. Ria Commander Dr. Morene Brownie Ellouise Class, FNP Jaun Bash, RPH-CPP  Please be sure to bring in all your medications bottles to every appointment.   Need to Contact Us :  If you have any questions or concerns before your next appointment please send us  a message through [place] or call our office at [phone].    TO LEAVE A MESSAGE FOR THE NURSE SELECT OPTION 2, PLEASE LEAVE A MESSAGE INCLUDING: YOUR NAME DATE OF BIRTH CALL BACK NUMBER REASON FOR CALL**this is important as we prioritize the call backs  YOU WILL RECEIVE A CALL BACK THE SAME DAY AS LONG AS YOU CALL BEFORE 4:00 PM

## 2023-09-09 ENCOUNTER — Ambulatory Visit (HOSPITAL_COMMUNITY): Payer: Self-pay | Admitting: Cardiology

## 2023-09-09 DIAGNOSIS — I5022 Chronic systolic (congestive) heart failure: Secondary | ICD-10-CM

## 2023-09-09 NOTE — Progress Notes (Signed)
 PCP: Dr. Norleen Cardiology: Dr. Okey HF Cardiology: Dr. Rolan  Chief complaint: CHF  78 y.o.with history of VSD repair as a child, COPD/active smoking, PAD, and chronic systolic CHF.  Patient has a long history of cardiomyopathy.  She had VSD repaired as a child and imaging has not showed residual VSD.  Cardiac MRI in 11/09 showed EF 36%.  Echo in 12/15 showed EF 40-45%.  Echo in 3/18 showed EF down to 15-20% with moderate RV systolic dysfunction.  RHC/LHC in 10/18 showed nonobstructive coronary but was concerning for low output (CI 1.33).  She had a nonhealing right lower extremity wound.  Peripheral angiogram with bilateral CFA occlusions and underwent DCB/self expanding stent to to right CFA.    CPX in 1/19 showed moderate to severe functional limitation, combination of HF and lung disease, probably more related to the lung disease.  PFTs in 12/18 showed severe COPD.   She had a Environmental manager CRT-D device placed.    7/19 peripheral arterial dopplers showed significant in-stent restenosis right CFA stent.  Echo in 4/21 showed EF 35-40% with mild AI.  CPX in 5/21 showed severe COPD, mild-moderate HF limitation.  11/21 Cardiolite showed prior septal and apical infarction, no ischemia.   Had COVID April 2022.   Echo in 9/22 showed EF 35-40% with basal-mid septal akinesis and anterior hypokinesis, no evidence for residual VSD, mildly decreased RV systolic function.  Echo in 11/23 showed EF 40% with basal to mid anteroseptal and inferoseptal akinesis, mild RV dysfunction, PASP 33 mmHg.  Echo in 9/24 showed EF 40%, basal-mid inferoseptal and anteroseptal akinesis, normal RV.   Aortoiliac dopplers in 5/25 showed right iliac disease.  No claudication, so managed medically.   She returns for followup of CHF. She is staying off cigarettes.  She stopped prednisone  (taking for COPD) and her breathing seems worse.  She is coughing more, short of breath walking up the stairs into the house. More  short of breath in the heat.  No chest pain.  No orthopnea/PND.  Weight stable. She denies claudication-type symptoms.   [name] device interrogation: HeartLogic 9, no AF, 92% BiV pacing, 57% A-pacing  Labs (4/24): hgb 14.1, K 4.2, creatinine 1.14, digoxin  level 0.6 Labs (7/24): K 4.9, creatinine 1.05, hgb 13.6, LDL 68 Labs (8/24): digoxin  0.9, BNP 347 Labs (1/25): K 3.9, creatinine 1.27, BNP 263 Labs (2/25): LDL 66 Labs (3/25): K 5.1, creatinine 1.33, digoxin  level 0.6  PMH: 1. VSD: s/p repair at [facility] as child.  From description, sounds like muscular VSD.  2. Atrial tachycardia s/p ablation.  3. Hyperlipidemia 4. COPD: Quit smoking 2024 - PFTs (12/18): Severe obstructive lung disease.  5. Atrial fibrillation: Paroxysmal.   Noted only transiently.   6. PAD:  - Angiogram 11/18 showed totally occluded bilateral CFAs.  Patient had stent to right CFA.  ABIs in 12/18 were normal on right. - ABIs (10/20): ABI 0.8 right, 0.74 left - 12/21 dopplers with R CFA stent in-stenosis.  - ABIs (1/24): normal on right, 0.68 on left with occluded left CFA.  - Aortoiliac dopplers (5/25): Iliac disease on right.  7. Chronic systolic CHF: Nonischemic cardiomyopathy.   - cMRI (11/09): EF 36%, VSD patch in anterior ventricular septum - Echo (12/15): EF 40-45% - Echo (3/18): EF 15-20%, moderate LV dilation, moderate MR, moderate RV dilation with moderately decreased systolic function.  - LHC/RHC (10/18): 3+ MR, EF < 25%, minimal nonobstructive CAD; PA 41/21, LVEDP 18, CI 1.33.  - CPX (1/19): peak VO2  13.4, VE/VCO2 slope 43, RER 1.07.  Mod-severe functional limitation due to combination of HF and lung disease, probably more related to lung disease.  - [name] CRT-D device.  - Echo (7/19): EF 25-30%, mild LV dilation, mild MR, normal RV size and systolic function.  - Echo (4/21): EF 35-40%, mild AI - CPX (5/21): severe COPD, mild-moderate HF limitation.  - Cardiolite (11/21): EF 47%,  prior septal and apical infarction, no ischemia. - Echo (9/22): EF 35-40% with basal-mid septal akinesis and anterior hypokinesis, no evidence for residual VSD, mildly decreased RV systolic function.  - Echo (11/23): EF 40% with basal to mid anteroseptal and inferoseptal akinesis, mild RV dysfunction, PASP 33 mmHg.  - Echo (9/24): EF 40%, basal-mid inferoseptal and anteroseptal akinesis, normal RV.  8. PVCs: Zio patch 9/19 with 6% PVCs.  9. COVID-19 (4/22)  SH: Smoker, lives in [place], married.  FH: Mother with PAD, sister died at birth from congenital heart disease.   ROS: All systems reviewed and negative except as per HPI.   Current Outpatient Medications  Medication Sig Dispense Refill   albuterol  (PROVENTIL ) (2.5 MG/3ML) 0.083% nebulizer solution Take 3 mLs (2.5 mg total) by nebulization every 6 (six) hours as needed for wheezing or shortness of breath. TAKE 3 MLS (1 VIAL) BY NEBULIZATION EVERY 6 HOURS AS NEEDED FOR WHEEZING OR SHORTNESS OF BREATH 90 mL 5   albuterol  (VENTOLIN  HFA) 108 (90 Base) MCG/ACT inhaler Inhale 2 puffs into the lungs every 6 (six) hours as needed for wheezing or shortness of breath. 8.5 g 5   alendronate  (FOSAMAX ) 70 MG tablet Take 1 tablet (70 mg total) by mouth every 7 (seven) days. Take with a full glass of water on an empty stomach. 12 tablet 3   ALPRAZolam  (XANAX ) 0.5 MG tablet TAKE 1 TABLET BY MOUTH TWICE PER DAY AS NEEDED 60 tablet 5   aspirin  81 MG tablet Take 1 tablet (81 mg total) by mouth daily. 100 tablet 99   bisoprolol  (ZEBETA ) 5 MG tablet TAKE 1/2 TABLET (2.5 MG TOTAL) BY MOUTH DAILY 45 tablet 1   Budeson-Glycopyrrol-Formoterol  (BREZTRI  AEROSPHERE) 160-9-4.8 MCG/ACT AERO Inhale 2 puffs into the lungs in the morning and at bedtime. 1 each 11   budesonide  (PULMICORT ) 0.25 MG/2ML nebulizer solution One vial twice daily thru nebulizer with albuterol  60 mL 12   digoxin  (LANOXIN ) 0.125 MG tablet TAKE 1/2 TABLET (62.5 MCG TOTAL) BY MOUTH DAILY 45 tablet  3   furosemide  (LASIX ) 20 MG tablet Take 2 tablets (40 mg total) by mouth 2 (two) times daily. (Patient taking differently: Take 20 mg by mouth 2 (two) times daily.) 120 tablet 3   oxyCODONE -acetaminophen  (PERCOCET) 7.5-325 MG tablet Take 1 tablet by mouth every 6 (six) hours as needed for severe pain (pain score 7-10). 30 tablet 0   rosuvastatin  (CRESTOR ) 10 MG tablet Take 1 tablet (10 mg total) by mouth daily. 90 tablet 3   sacubitril -valsartan  (ENTRESTO ) 24-26 MG Take 1 tablet by mouth 2 (two) times daily. 60 tablet 11   spironolactone  (ALDACTONE ) 25 MG tablet Take 1 tablet (25 mg total) by mouth at bedtime. 30 tablet 6   tizanidine  (ZANAFLEX ) 2 MG capsule Take 2 mg by mouth as needed for muscle spasms.     predniSONE  (DELTASONE ) 10 MG tablet TAKE 1 TABLET BY MOUTH DAILY WITH BREAKFAST (Patient not taking: Reported on 09/08/2023) 30 tablet 1   No current facility-administered medications for this visit.   BP (!) 101/45 (BP Location: Left Arm, Patient  Position: Sitting, Cuff Size: Normal)   Pulse 60   Wt 110 lb (49.9 kg)   SpO2 99%   BMI 20.78 kg/m   Wt Readings from Last 3 Encounters:  09/08/23 110 lb (49.9 kg)  07/03/23 112 lb 12.8 oz (51.2 kg)  06/19/23 110 lb (49.9 kg)  General: NAD Neck: No JVD, no thyromegaly or thyroid  nodule.  Lungs: Distant BS CV: Nondisplaced PMI.  Heart regular S1/S2, no S3/S4, no murmur.  No peripheral edema.  No carotid bruit.  Unable to palpate pedal pulses.  Abdomen: Soft, nontender, no hepatosplenomegaly, no distention.  Skin: Intact without lesions or rashes.  Neurologic: Alert and oriented x 3.  Psych: Normal affect. Extremities: No clubbing or cyanosis.  HEENT: Normal.   Assessment/Plan: 1.  PAD: She had endovascular intervention to occluded right CFA in 11/18, but subsequent arterial showed in-stent restenosis. She has occluded left CFA also that was not intervened on. Aorto-iliac dopplers in 5/25 showed right iliac disease.  Still with minimal  claudication, no pedal ulcers.  She has quit smoking.  Follows with Dr. Darron for PV with plan for medical management unless claudication worsens.  - Continue statin.  2. CAD: Nonobstructive on 10/18 cath. Cardiolite in 11/21 with no evidence of ischemia. No recent chest pain.  - She is on ASA 81 and statin.   3. Hyperlipidemia: Continue crestor , LDL acceptable in 2/25.  4. COPD: Severe by PFTs on 5/21 CPX. COPD plays a significant role in her dyspnea. She has finally quit smoking.  Follows with pulmonary.  She stopped prednisone  but respiratory symptoms have worsened since that time.  She is not volume overloaded by exam or HeartLogic.  - I think she is probably going to need to go back on prednisone  5 mg daily given worsening symptoms when she stopped it.  Asked her to discuss this with pulmonary.  5. Chronic systolic CHF: Nonischemic cardiomyopathy.  RHC/LHC in 10/18 with no nonobstructive coronary disease but CI was very low at 1.33.  Her low cardiac output was out of proportion to her symptoms.  [name] CRT-D device.  CPX in 5/21 showed severe COPD but only mild-moderate HF limitation.   Echo with EF stable at 35-40% in 9/22 and EF 40% on 11/23 echo.  Echo in 9/24 was stable with EF 40%, basal-mid inferoseptal and anteroseptal akinesis, normal RV. NYHA class III, symptoms confounded by severe COPD, worse since stopping prednisone .  She is not volume overloaded by exam or HeartLogic today.   - Continue Lasix  40 mg bid. BMET/BNP today.  - Continue Entresto  24/26 bid. No BP room to increase.  - Continue bisoprolol  2.5 mg daily. No BP room to increase.  - Continue spironolactone  25 mg daily.  - Continue digoxin , check level today.  - Unable to tolerate Farxiga .  - Repeat echo in 9/25.   Followup 3 months.   I spent 31 minutes reviewing records, interviewing/examining patient, and managing orders.   Ezra Shuck 09/09/2023

## 2023-09-09 NOTE — Telephone Encounter (Signed)
 Pt with pt about getting blood work BEFORE taking am digoxin . Pt agreeable. States she will get blood work done either 09/10/23 or 09/11/23. No further questions at this time.

## 2023-09-09 NOTE — Telephone Encounter (Signed)
-----   Message from Ezra Shuck sent at 09/09/2023 10:58 AM EDT ----- Digoxin  level is a little high.  Please have her return for digoxin  level, draw as trough in the morning before she takes am digoxin .  ----- Message ----- From: Rebecka, Lab In [employer] Sent: 09/08/2023   5:00 PM EDT To: Ezra GORMAN Shuck, MD

## 2023-09-10 ENCOUNTER — Other Ambulatory Visit
Admission: RE | Admit: 2023-09-10 | Discharge: 2023-09-10 | Disposition: A | Discharge status: Home / Self Care | Attending: Cardiology | Admitting: Cardiology

## 2023-09-10 ENCOUNTER — Ambulatory Visit (HOSPITAL_COMMUNITY): Payer: Self-pay | Admitting: Cardiology

## 2023-09-10 DIAGNOSIS — I5022 Chronic systolic (congestive) heart failure: Secondary | ICD-10-CM | POA: Diagnosis not present

## 2023-09-10 LAB — DIGOXIN LEVEL: Digoxin Level: 0.7 ng/mL — ABNORMAL LOW (ref 0.8–2.0)

## 2023-09-14 ENCOUNTER — Ambulatory Visit: Admitting: Dermatology

## 2023-09-14 DIAGNOSIS — L578 Other skin changes due to chronic exposure to nonionizing radiation: Secondary | ICD-10-CM | POA: Diagnosis not present

## 2023-09-14 DIAGNOSIS — Z4800 Encounter for change or removal of nonsurgical wound dressing: Secondary | ICD-10-CM

## 2023-09-14 DIAGNOSIS — D692 Other nonthrombocytopenic purpura: Secondary | ICD-10-CM

## 2023-09-14 DIAGNOSIS — I781 Nevus, non-neoplastic: Secondary | ICD-10-CM | POA: Diagnosis not present

## 2023-09-14 DIAGNOSIS — W908XXA Exposure to other nonionizing radiation, initial encounter: Secondary | ICD-10-CM

## 2023-09-14 DIAGNOSIS — Z7189 Other specified counseling: Secondary | ICD-10-CM

## 2023-09-14 DIAGNOSIS — S80811A Abrasion, right lower leg, initial encounter: Secondary | ICD-10-CM

## 2023-09-14 DIAGNOSIS — Z79899 Other long term (current) drug therapy: Secondary | ICD-10-CM

## 2023-09-14 DIAGNOSIS — S81839A Puncture wound without foreign body, unspecified lower leg, initial encounter: Secondary | ICD-10-CM

## 2023-09-14 MED ORDER — DOXYCYCLINE MONOHYDRATE 100 MG PO CAPS: 100.0000 mg | ORAL_CAPSULE | Freq: Two times a day (BID) | ORAL | 0 refills | Status: DC

## 2023-09-14 NOTE — Progress Notes (Unsigned)
   Follow-Up Visit   Subjective  Kimberly [name] is a 78 y.o. female who presents for the following: Patient reports she was injured at right lower leg 3 weeks ago from her dog. Wound has been tender, red and swollen since and will not go away.   Has used otc creams but not helping.  The following portions of the chart were reviewed this encounter and updated as appropriate: medications, allergies, medical history  Review of Systems:  No other skin or systemic complaints except as noted in HPI or Assessment and Plan.  Objective  Well appearing patient in no apparent distress; mood and affect are within normal limits.  A focused examination was performed of the following areas: Right lower leg   Relevant exam findings are noted in the Assessment and Plan.       Assessment & Plan   Wound at right lower leg from previous trauma from dog scratch  Exam: 1.5cm crusted ulcer at right lower leg See photos Treatment Plan: History of trauma at right lower leg 3 weeks ago from dog No infection observed at appointment today Area cleaned with Purycn, applied vaseline a non stick gauze and wrapped with coban. Start Doxycycline  100 mg capsule - take 1 by mouth bid with food and drink for 1 week. Recommend cleaning area gently daily and apply otc cream such as honey topicals daily until healed.  Doxycycline  should be taken with food to prevent nausea. Do not lay down for 30 minutes after taking. Be cautious with sun exposure and use good sun protection while on this medication. Pregnant women should not take this medication.   Purpura - Chronic; persistent and recurrent.  Treatable, but not curable. - Violaceous macules and patches - Benign - Related to trauma, age, sun damage and/or use of blood thinners, chronic use of topical and/or oral steroids - Observe - Can use OTC arnica containing moisturizer such as Dermend Bruise Formula if desired - Call for worsening or other  concerns  Varicose Veins/Spider Veins - Dilated blue, purple or red veins at the lower extremities - Reassured - Smaller vessels can be treated by sclerotherapy (a procedure to inject a medicine into the veins to make them disappear) if desired, but the treatment is not covered by insurance. Larger vessels may be covered if symptomatic and we would refer to vascular surgeon if treatment desired.  ACTINIC DAMAGE - chronic, secondary to cumulative UV radiation exposure/sun exposure over time - diffuse scaly erythematous macules with underlying dyspigmentation - Recommend daily broad spectrum sunscreen SPF 30+ to sun-exposed areas, reapply every 2 hours as needed.  - Recommend staying in the shade or wearing long sleeves, sun glasses (UVA+UVB protection) and wide brim hats (4-inch brim around the entire circumference of the hat). - Call for new or changing lesions.  ENCOUNTER FOR CHANGE OR REMOVAL OF NONSURGICAL WOUND DRESSING   Related Medications doxycycline  (MONODOX ) 100 MG capsule Take 1 capsule (100 mg total) by mouth 2 (two) times daily for 7 days. Take with food and drink  Return if symptoms worsen or fail to improve.  IEleanor Blush, CMA, am acting as scribe for Alm Rhyme, MD.   Documentation: I have reviewed the above documentation for accuracy and completeness, and I agree with the above.  Alm Rhyme, MD

## 2023-09-14 NOTE — Patient Instructions (Addendum)
 For wound at right lower leg  Start doxycycline  100 mg capsule - take 1 pill by mouth twice daily for 7 days take with food and drink  Doxycycline  should be taken with food to prevent nausea. Do not lay down for 30 minutes after taking. Be cautious with sun exposure and use good sun protection while on this medication. Pregnant women should not take this medication.    Clean area gently when washing area daily and cover with otc ointment such as honey products and wrap.    Due to recent changes in healthcare laws, you may see results of your pathology and/or laboratory studies on MyChart before the doctors have had a chance to review them. We understand that in some cases there may be results that are confusing or concerning to you. Please understand that not all results are received at the same time and often the doctors may need to interpret multiple results in order to provide you with the best plan of care or course of treatment. Therefore, we ask that you please give us  2 business days to thoroughly review all your results before contacting the office for clarification. Should we see a critical lab result, you will be contacted sooner.   If You Need Anything After Your Visit  If you have any questions or concerns for your doctor, please call our main line at [phone] and press option 4 to reach your doctor's medical assistant. If no one answers, please leave a voicemail as directed and we will return your call as soon as possible. Messages left after 4 pm will be answered the following business day.   You may also send us  a message via MyChart. We typically respond to MyChart messages within 1-2 business days.  For prescription refills, please ask your pharmacy to contact our office. Our fax number is [phone].  If you have an urgent issue when the clinic is closed that cannot wait until the next business day, you can page your doctor at the number below.    Please note that  while we do our best to be available for urgent issues outside of office hours, we are not available 24/7.   If you have an urgent issue and are unable to reach us , you may choose to seek medical care at your doctor's office, retail clinic, urgent care center, or emergency room.  If you have a medical emergency, please immediately call 911 or go to the emergency department.  Pager Numbers  - Dr. Hester: [phone]  - Dr. Jackquline: [phone]  - Dr. Claudene: [phone]   - Dr. Raymund: [phone]  In the event of inclement weather, please call our main line at [phone] for an update on the status of any delays or closures.  Dermatology Medication Tips: Please keep the boxes that topical medications come in in order to help keep track of the instructions about where and how to use these. Pharmacies typically print the medication instructions only on the boxes and not directly on the medication tubes.   If your medication is too expensive, please contact our office at [phone] option 4 or send us  a message through MyChart.   We are unable to tell what your co-pay for medications will be in advance as this is different depending on your insurance coverage. However, we may be able to find a substitute medication at lower cost or fill out paperwork to get insurance to cover a needed medication.   If a prior authorization is required to  get your medication covered by your insurance company, please allow us  1-2 business days to complete this process.  Drug prices often vary depending on where the prescription is filled and some pharmacies may offer cheaper prices.  The website www.goodrx.com contains coupons for medications through different pharmacies. The prices here do not account for what the cost may be with help from insurance (it may be cheaper with your insurance), but the website can give you the price if you did not use any insurance.  - You can print the associated  coupon and take it with your prescription to the pharmacy.  - You may also stop by our office during regular business hours and pick up a GoodRx coupon card.  - If you need your prescription sent electronically to a different pharmacy, notify our office through [facility] or by phone at [phone] option 4.     Si Usted Necesita Algo Despus de Su Visita  Tambin puede enviarnos un mensaje a travs de Clinical cytogeneticist. Por lo general respondemos a los mensajes de MyChart en el transcurso de 1 a 2 das hbiles.  Para renovar recetas, por favor pida a su farmacia que se ponga en contacto con nuestra oficina. Randi lakes de fax es [place] [phone].  Si tiene un asunto urgente cuando la clnica est cerrada y que no puede esperar hasta el siguiente da hbil, puede llamar/localizar a su doctor(a) al nmero que aparece a continuacin.   Por favor, tenga en cuenta que aunque hacemos todo lo posible para estar disponibles para asuntos urgentes fuera del horario de [place], no estamos disponibles las 24 horas del da, los 7 [address]  [address] de la [place].   Si tiene un problema urgente y no puede comunicarse con nosotros, puede optar por buscar atencin mdica  en el consultorio de su doctor(a), en una clnica privada, en un centro de atencin urgente o en una sala de emergencias.  Si tiene Engineer, drilling, por favor llame inmediatamente al 911 o vaya a la sala de emergencias.  Nmeros de bper  - Dr. Hester: [phone]  - Dra. Jackquline: [phone]  - Dr. Claudene: [phone]  - Dra. Kitts: [phone]  En caso de inclemencias del [place], por favor llame a nuestra lnea principal al [phone] para una actualizacin sobre el estado de cualquier retraso o cierre.  Consejos para la medicacin en dermatologa: Por favor, guarde las cajas en las que vienen los medicamentos de uso tpico para ayudarle a seguir las instrucciones sobre dnde y cmo usarlos. Las farmacias generalmente imprimen  las instrucciones del medicamento slo en las cajas y no directamente en los tubos del [place].   Si su medicamento es muy caro, por favor, pngase en contacto con landry rieger llamando al [phone] y presione la opcin 4 o envenos un mensaje a travs de Clinical cytogeneticist.   No podemos decirle cul ser su copago por los medicamentos por adelantado ya que esto es diferente dependiendo de la cobertura de su seguro. Sin embargo, es posible que podamos encontrar un medicamento sustituto a Audiological scientist un formulario para que el seguro cubra el medicamento que se considera necesario.   Si se requiere una autorizacin previa para que su compaa de seguros malta su medicamento, por favor permtanos de 1 a 2 das hbiles para completar este proceso.  Los precios de los medicamentos varan con frecuencia dependiendo del Environmental consultant de dnde se surte la receta y alguna farmacias pueden ofrecer precios ms baratos.  El sitio web www.goodrx.com tiene cupones  para medicamentos de Health and safety inspector. Los precios aqu no tienen en cuenta lo que podra costar con la ayuda del seguro (puede ser ms barato con su seguro), pero el sitio web puede darle el precio si no utiliz Tourist information centre manager.  - Puede imprimir el cupn correspondiente y llevarlo con su receta a la farmacia.  - Tambin puede pasar por nuestra oficina durante el horario de atencin regular y Education officer, museum una tarjeta de cupones de GoodRx.  - Si necesita que su receta se enve electrnicamente a una farmacia diferente, informe a nuestra oficina a travs de MyChart de [place] o por telfono llamando al [phone] y presione la opcin 4.

## 2023-09-15 ENCOUNTER — Encounter: Payer: Self-pay | Admitting: Dermatology

## 2023-09-16 ENCOUNTER — Telehealth: Payer: Self-pay

## 2023-09-16 ENCOUNTER — Ambulatory Visit (INDEPENDENT_AMBULATORY_CARE_PROVIDER_SITE_OTHER): Payer: Medicare Other

## 2023-09-16 DIAGNOSIS — I428 Other cardiomyopathies: Secondary | ICD-10-CM | POA: Diagnosis not present

## 2023-09-16 NOTE — Telephone Encounter (Signed)
 Patient called and left voicemail re: wound care on leg and being unable to find medihoney anywhere locally. Patient stated she was told it was on backorder at several places, but did place an order at her drugstore. Advised her that she could also order through [facility] and ship to her house if more convenient for her. Patient also advised to avoid using raw honey on wound when cleaning it and wrapping. Patient was also wanting to discuss options for hair loss/thinning and what to use. Patient advised she would need an an appointment to be evaluated, scheduled appointment for patient. Patient voiced understanding to all.

## 2023-09-17 ENCOUNTER — Ambulatory Visit: Payer: Self-pay | Admitting: Internal Medicine

## 2023-09-17 LAB — CUP PACEART REMOTE DEVICE CHECK
Battery Remaining Longevity: 54 mo
Battery Remaining Percentage: 63 %
Brady Statistic RA Percent Paced: 58 %
Brady Statistic RV Percent Paced: 92 %
Date Time Interrogation Session: 20250827041000
HighPow Impedance: 76 Ohm
Lead Channel Impedance Value: 468 Ohm
Lead Channel Impedance Value: 533 Ohm
Lead Channel Impedance Value: 721 Ohm
Lead Channel Setting Pacing Amplitude: 2 V
Lead Channel Setting Pacing Amplitude: 2.5 V
Lead Channel Setting Pacing Amplitude: 2.6 V
Lead Channel Setting Pacing Pulse Width: 0.4 ms
Lead Channel Setting Pacing Pulse Width: 0.4 ms
Lead Channel Setting Sensing Sensitivity: 0.5 mV
Lead Channel Setting Sensing Sensitivity: 1 mV
Pulse Gen Serial Number: 204176
Zone Setting Status: 755011

## 2023-09-21 NOTE — Progress Notes (Unsigned)
 Kimberly [name], female    DOB: 1945/12/14     MRN: [id number]   Brief patient profile:  39 yowf active smoker  GOLD III  referred to pulmonary clinic 09/05/2019 by Dr  Rolan for sob     History of Present Illness  09/05/2019  Pulmonary/ 1st office eval/[name]  - no maint rx  Chief Complaint  Patient presents with   Follow-up    pt needs help to quit smoking.pt is wearing patches .pt can'y take chantix   Dyspnea:  Does [employer]/ [facility] s maint rx but struggles  Cough: typical for her takes 4-5 coughs with mucoid mucus and then  good to go  Sleep: sleeps bed flat/ one pillow on side  SABA use: one  5-6 x daily Rec Plan A = Automatic = Always=    Breztri  Take 2 puffs first thing in am and then another 2 puffs about 12 hours later.  Work on inhaler technique: Plan B = Backup (to supplement plan A, not to replace it) Only use your albuterol  inhaler as a rescue medication Prednisone  10 mg take  4 each am x 2 days,   2 each am x 2 days,  1 each am x 2 days and stop  The key is to stop smoking completely before smoking completely stops you! Add: needs alpha one AT screen next ov   01/25/2020  f/u ov/[name] re: needs alpha one testing/ GOLD III copd / smoking  Chief Complaint  Patient presents with   Follow-up    Breathing is ok- she feels that the Breztri  helped but she ran out after 2 samples and copay to high. She is using her albuterol  inhaler 3-4 x per wk.   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  I will call the drug rep for breztri  to see what happened with your application > informed he is not able to do this due to company policy / HIPPA laws  Please schedule a follow up visit in 3 months but call sooner if needed    .07/27/2020  f/u ov/[name] re:  GOLD III copd/ last smoked last week / maint on breztri    Chief Complaint  Patient presents with   Follow-up    Breathing is overall doing well. She is using her albuterol  inhaler approx 2 x per day.    Dyspnea:  better doing cart at [facility] and [employer]/ no 02, more trouble with bending over  Cough: variable / no purulent sputum Sleeping: sometimes cough noct /bed is flat / one pillow  SABA use: 3 x daily and never pre challenges or re -challenges p saba 02: none  Rec Only use your albuterol  as a rescue medication   Also ok to try albuterol  15 min before an activity (on alternating days)  that you know would make you short of breath The key  is to stop smoking completely before smoking completely stops you!   03/11/2023  f/u ov/[name] re: GOLD 3 copd  quit smoking Nov 2024   maint  on breztri   and pred 10 mg one half daily  Chief Complaint  Patient presents with   Follow-up    SOB with exertion.  Dyspnea:  still pushing cart at [employer] [facility] 02  Cough: minimal slt yellow esp in am  Sleeping: bed flat/ 2 pillows s  resp cc  SABA use: 1st thing in am and 3-4 x during the day (though hfa quite poor)  02: 2lpm Rec     09/22/2023  f/u ov/[name] re: GOLD 3 copd    maint on ***  No chief complaint on file.   Dyspnea:  *** Cough: *** Sleeping: *** resp cc  SABA use: *** 02: ***  Lung cancer screening :  ***    No obvious day to day or daytime variability or assoc excess/ purulent sputum or mucus plugs or hemoptysis or cp or chest tightness, subjective wheeze or overt sinus or hb symptoms.    Also denies any obvious fluctuation of symptoms with weather or environmental changes or other aggravating or alleviating factors except  as outlined above   No unusual exposure hx or h/o childhood pna/ asthma or knowledge of premature birth.  Current Allergies, Complete Past Medical History, Past Surgical History, Family History, and Social History were reviewed in [employer] record.  ROS  The following are not active complaints unless bolded Hoarseness, sore throat, dysphagia, dental problems, itching, sneezing,  nasal congestion or discharge of excess mucus or purulent secretions, ear ache,   fever, chills, sweats, unintended wt loss or wt gain, classically pleuritic or exertional cp,  orthopnea pnd or arm/hand swelling  or leg swelling, presyncope, palpitations, abdominal pain, anorexia, nausea, vomiting, diarrhea  or change in bowel habits or change in bladder habits, change in stools or change in urine, dysuria, hematuria,  rash, arthralgias, visual complaints, headache, numbness, weakness or ataxia or problems with walking or coordination,  change in mood or  memory.        No outpatient medications have been marked as taking for the 09/22/23 encounter (Appointment) with Darlean Ozell NOVAK, MD.               Past Medical History:  Diagnosis Date   AICD (automatic cardioverter/defibrillator) present 03/17/2017   Anxiety    Atrial tachycardia (HCC)    Bursitis of shoulder, right, adhesive    CHF (congestive heart failure) (HCC)    Chronic bronchitis (HCC)    1-2 times/yr (01/23/2014)   COPD (chronic obstructive pulmonary disease) (HCC)    CVD (cerebrovascular disease)    Dyslipidemia    Dysrhythmia    Frequency of urination    GERD (gastroesophageal reflux disease)    Heart murmur    History of stomach ulcers    HTN (hypertension) 02/22/2011   Migraines    stopped many years ago (06/14/2014)   Osteoporosis 08/19/2016   Pericarditis    Pneumonia 10 times (06/14/2014)   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)    Silent myocardial infarction [facility]) late  1990's   Stress incontinence    was suppose to have been tacked up years ago but I didn't do it   Syncope, near    Associated with atrial tachycardia-event recorder 1/16   Thoracic outlet syndrome    VSD (ventricular septal defect)  Objective:    Wts  09/22/2023         ***  03/11/2023       110  07/27/2020         106  01/25/20 105 lb (47.6 kg)  12/06/19 109 lb (49.4 kg)  12/05/19 106 lb (48.1 kg)    Vital signs reviewed  09/22/2023  - Note at rest 02 sats  ***% on ***   General appearance:    ***   using rollator/ back brace      Mod barr ***           Assessment      .

## 2023-09-22 ENCOUNTER — Encounter: Payer: Self-pay | Admitting: Internal Medicine

## 2023-09-22 ENCOUNTER — Ambulatory Visit (INDEPENDENT_AMBULATORY_CARE_PROVIDER_SITE_OTHER): Admitting: Internal Medicine

## 2023-09-22 VITALS — BP 92/54 | HR 60 | Temp 97.9°F | Ht 61.0 in | Wt 108.6 lb

## 2023-09-22 DIAGNOSIS — J9611 Chronic respiratory failure with hypoxia: Secondary | ICD-10-CM | POA: Diagnosis not present

## 2023-09-22 DIAGNOSIS — Z87891 Personal history of nicotine dependence: Secondary | ICD-10-CM | POA: Diagnosis not present

## 2023-09-22 DIAGNOSIS — J441 Chronic obstructive pulmonary disease with (acute) exacerbation: Secondary | ICD-10-CM | POA: Diagnosis not present

## 2023-09-22 MED ORDER — PREDNISONE 10 MG PO TABS: ORAL_TABLET | ORAL | 1 refills | Status: DC

## 2023-09-22 MED ORDER — BREZTRI AEROSPHERE 160-9-4.8 MCG/ACT IN AERO: 2.0000 | INHALATION_SPRAY | Freq: Two times a day (BID) | RESPIRATORY_TRACT | Status: DC

## 2023-09-22 NOTE — Assessment & Plan Note (Addendum)
 Quit smoking 11/2022  - Low-dose CT lung cancer screening is recommended for patients who are 85-76 years of age with a 20+ pack-year history of smoking and who are currently smoking or quit <=15 years ago. No coughing up blood  No unintentional weight loss of > 15 pounds in the last 6 months - pt is eligible for scanning yearly until age 31 > due  q October emphasized.

## 2023-09-22 NOTE — Patient Instructions (Addendum)
 For cough/ congestion > mucinex  or mucinex  dm  up to maximum of  1200 mg every 12 hours and use the flutter valve as much as you can    Plan A = Automatic = Always=    Breztri  Take 2 puffs first thing in am and then another 2 puffs about 12 hours later  - prednisone  5 mg daily is your floor   Work on inhaler technique:  relax and gently blow all the way out then take a nice smooth full deep breath back in, triggering the inhaler at same time you start breathing in.  Hold breath in for at least  5 seconds if you can. Blow out Breztri   thru nose. Rinse and gargle with water when done.  If mouth or throat bother you at all,  try brushing teeth/gums/tongue with arm and hammer toothpaste/ make a slurry and gargle and spit out.   >>>  Remember how golfers warm up by taking practice swings - do this with an empty inhaler    Plan B = Backup (to supplement plan A, not to replace it) Use your albuterol  inhaler as a rescue medication to be used if you can't catch your breath by resting or slowing your pace  or doing a relaxed purse lip breathing pattern.  - The less you use it, the better it will work when you need it. - Ok to use the inhaler up to 2 puffs  every 4 hours if you must but call for appointment if use goes up over your usual need - Don't leave home without it !!  (think of it like the spare tire or starter fluid for your car)   Plan C = Crisis (instead of Plan B but only if Plan B stops working) - only use your albuterol -budesonide  nebulizer if you first try Plan B and it fails to help > ok to use the nebulizer up to every 4 hours but if start needing it regularly call for immediate appointment  Plan D = prednisone  10 mg if ABC not working  Use 2 daily until better then 1 daily x 5 days and then one-half daily   Continue with 2lpm at bedtime but no need for  daytime as long as 02 sats over 90%    Please schedule a follow up visit in 3 months but call sooner if needed to see Tammy or Izetta

## 2023-09-22 NOTE — Assessment & Plan Note (Addendum)
?   Quit smokng 07/2020  - PFT's  01/12/17   FEV1 0.88 (44 % ) ratio 0.51  p 16 % improvement from saba p ? prior to study with DLCO  9.68 (47%) corrects to 3.47 (78%)  for alv volume and FV curve slt atypical concave pattern  - 09/05/2019   try breztri   - Labs ordered 01/25/2020  :    alpha one AT phenotype  MS  Level 134  - 03/11/2023  After extensive coaching inhaler device,  effectiveness =    near 0 (very sort ti) > rec change to alb/bud 0.25 mg bid as add on to breztri  maint  - 09/22/2023  After extensive coaching inhaler device,  effectiveness =  25% with fha/added flutter valve  - 09/22/2023 added prednisone  as plan D = 10  mgx 2 until bettter, then x1 x 5 days then 1/2 daily maint  and use bud/alb as plan C

## 2023-09-22 NOTE — Addendum Note (Signed)
 Addended by: Jerardo Costabile M on: 09/22/2023 12:52 PM   Modules accepted: Orders

## 2023-09-22 NOTE — Assessment & Plan Note (Addendum)
 02 dep as of 03/11/2023 at 2lpm hs  - .Patient walked at slow pace on 2lpm cont  walked 1/4 of lap 1 with SaO2 down to 84%, Applied 5L POC pulsed oxygen . Was unable to get patients SaO2 above 90%. Notified Dr. Darlean. Per Dr. Darlean, order Best Fit for patient and do not order POC.  - 09/22/2023   Walked on RA   x  3  lap(s) =  approx 750   ft  @ mod pace, stopped due to sob  with lowest 02 sats 94%    Has improved off cigs so ok to use 2lpm hs and prn daytime  Each maintenance medication was reviewed in detail including emphasizing most importantly the difference between maintenance and prns and under what circumstances the prns are to be triggered using an action plan format where appropriate.  Total time for H and P, chart review, counseling, reviewing hfa/neb  device(s) , directly observing portions of ambulatory 02 saturation study/ and generating customized AVS unique to this office visit / same day charting = 35 min

## 2023-09-25 NOTE — Progress Notes (Signed)
 Referring Physician:  [name], MD [address] [place],  [place] [address]  Primary Physician:  [name], MD  History of Present Illness: [name] has a history of repair of VSD x 2, HTN, PVD, dilated cardiomyopathy, chronic systolic CHF, severe COPD, GERD, hepatic cirrhosis, osteoporosis, dyslipidemia, ICD.   She is on 2L O2 prn.   She has an implantable cardioverter-defibrillator.   Last seen by me on 05/25/23 for T7 compression fracture that likely occurred around 02/27/23. She was doing well and was released to follow up prn.   2 week history of mid back pain that is constant. She has numbness, tingling, and weakness in her back. Pain is worse with bending and walking. Pain is better with sitting and medications. No radiation of pain into ribs or down her legs.   Also with chronic neck pain x years. Pain is constant and radiates up into her head. Diagnosed with DDD in the past. No arm pain. No numbness, tingling, or weakness.   She takes prn percocet (from PCP) rarely- she is out of prescription he gave her in July. She is on chronic prednisone - is down to 5mg .   She is not smoking.   No bowel or bladder issues.   Past Surgery: none  The symptoms are causing a significant impact on the patient's life.   Review of Systems:  A 10 point review of systems is negative, except for the pertinent positives and negatives detailed in the HPI.  Past Medical History: Past Medical History:  Diagnosis Date   AICD (automatic cardioverter/defibrillator) present 03/17/2017   Anxiety    Atrial tachycardia (HCC)    Basal cell carcinoma    R ant thigh, L pretibia, both txted at [facility]   Bursitis of shoulder, right, adhesive    Cancer (HCC)    CHF (congestive heart failure) (HCC)    Chronic bronchitis (HCC)    1-2 times/yr (01/23/2014)   COPD (chronic obstructive pulmonary disease) (HCC)    CVD (cerebrovascular disease)    Dyslipidemia    Dysrhythmia     Frequency of urination    GERD (gastroesophageal reflux disease)    Heart murmur    History of stomach ulcers    HTN (hypertension) 02/22/2011   Migraines    stopped many years ago (06/14/2014)   Osteoporosis 08/19/2016   Pericarditis    Pneumonia 10 times (06/14/2014)   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)    Silent myocardial infarction [facility]) late 1990's   Stress incontinence    was suppose to have been tacked up years ago but I didn't do it   Syncope, near    Associated with atrial tachycardia-event recorder 1/16   Thoracic outlet syndrome    VSD (ventricular septal defect)     Past Surgical History: Past Surgical History:  Procedure Laterality Date   ABDOMINAL AORTOGRAM W/LOWER EXTREMITY N/A 12/17/2016   Procedure: ABDOMINAL AORTOGRAM W/LOWER EXTREMITY;  Surgeon: Darron Deatrice LABOR, MD;  Location: [facility] INVASIVE CV LAB;  Service: Cardiovascular;  Laterality: N/A;   BIV ICD INSERTION CRT-D N/A 03/17/2017   Procedure: BIV ICD INSERTION CRT-D;  Surgeon: Waddell Danelle ORN, MD;  Location: [facility] INVASIVE CV LAB;  Service: Cardiovascular;  Laterality: N/A;   CARDIAC CATHETERIZATION  quite a few   CESAREAN SECTION  1972   CHOLECYSTECTOMY OPEN  1970's   CORONARY ANGIOGRAM  2000   No significant CAD   ELECTROPHYSIOLOGIC STUDY N/A 06/14/2014  Procedure: A-Flutter/A-Tach/SVT Ablation;  Surgeon: Danelle LELON Birmingham, MD;  Location: [facility] INVASIVE CV LAB;  Service: Cardiovascular;  Laterality: N/A;   INSERTION OF ICD  03/17/2017   BIV   MULTIPLE TOOTH EXTRACTIONS     MYRINGOTOMY WITH TUBE PLACEMENT Right 2015   PERIPHERAL VASCULAR INTERVENTION  12/17/2016   Procedure: PERIPHERAL VASCULAR INTERVENTION;  Surgeon: Darron Deatrice LABOR, MD;  Location: [facility] INVASIVE CV LAB;  Service: Cardiovascular;;  Right common femoral PTA and Stent   RIGHT/LEFT HEART CATH AND CORONARY ANGIOGRAPHY Bilateral 11/06/2016   Procedure: RIGHT/LEFT HEART CATH AND CORONARY ANGIOGRAPHY;   Surgeon: Darron Deatrice LABOR, MD;  Location: [facility] INVASIVE CV LAB;  Service: Cardiovascular;  Laterality: Bilateral;   SVT ABLATION  06/14/2014   TUBAL LIGATION  1972   VSD REPAIR  1958; 1967    Allergies: Allergies as of 09/29/2023 - Review Complete 09/29/2023  Allergen Reaction Noted   Mupirocin  Shortness Of Breath and Other (See Comments) 08/26/2016   Codeine Nausea Only 11/13/2008    Medications: Outpatient Encounter Medications as of 09/29/2023  Medication Sig   albuterol  (PROVENTIL ) (2.5 MG/3ML) 0.083% nebulizer solution Take 3 mLs (2.5 mg total) by nebulization every 6 (six) hours as needed for wheezing or shortness of breath. TAKE 3 MLS (1 VIAL) BY NEBULIZATION EVERY 6 HOURS AS NEEDED FOR WHEEZING OR SHORTNESS OF BREATH   albuterol  (VENTOLIN  HFA) 108 (90 Base) MCG/ACT inhaler Inhale 2 puffs into the lungs every 6 (six) hours as needed for wheezing or shortness of breath.   alendronate  (FOSAMAX ) 70 MG tablet Take 1 tablet (70 mg total) by mouth every 7 (seven) days. Take with a full glass of water on an empty stomach.   ALPRAZolam  (XANAX ) 0.5 MG tablet TAKE 1 TABLET BY MOUTH TWICE PER DAY AS NEEDED   aspirin  81 MG tablet Take 1 tablet (81 mg total) by mouth daily.   bisoprolol  (ZEBETA ) 5 MG tablet TAKE 1/2 TABLET (2.5 MG TOTAL) BY MOUTH DAILY   Budeson-Glycopyrrol-Formoterol  (BREZTRI  AEROSPHERE) 160-9-4.8 MCG/ACT AERO Inhale 2 puffs into the lungs in the morning and at bedtime.   budesonide  (PULMICORT ) 0.25 MG/2ML nebulizer solution One vial twice daily thru nebulizer with albuterol    budesonide -glycopyrrolate-formoterol  (BREZTRI  AEROSPHERE) 160-9-4.8 MCG/ACT AERO inhaler Inhale 2 puffs into the lungs in the morning and at bedtime.   digoxin  (LANOXIN ) 0.125 MG tablet TAKE 1/2 TABLET (62.5 MCG TOTAL) BY MOUTH DAILY   furosemide  (LASIX ) 20 MG tablet Take 2 tablets (40 mg total) by mouth 2 (two) times daily. (Patient taking differently: Take 20 mg by mouth 2 (two) times daily.)    rosuvastatin  (CRESTOR ) 10 MG tablet Take 1 tablet (10 mg total) by mouth daily.   sacubitril -valsartan  (ENTRESTO ) 24-26 MG Take 1 tablet by mouth 2 (two) times daily.   spironolactone  (ALDACTONE ) 25 MG tablet Take 1 tablet (25 mg total) by mouth at bedtime.   tizanidine  (ZANAFLEX ) 2 MG capsule Take 2 mg by mouth as needed for muscle spasms.   [DISCONTINUED] oxyCODONE -acetaminophen  (PERCOCET) 7.5-325 MG tablet Take 1 tablet by mouth every 6 (six) hours as needed for severe pain (pain score 7-10).   oxyCODONE -acetaminophen  (PERCOCET) 7.5-325 MG tablet Take 1 tablet by mouth every 8 (eight) hours as needed for severe pain (pain score 7-10).   predniSONE  (DELTASONE ) 10 MG tablet 2 daily until breathing better then 1 x 5 days then one half daily thereafter   No facility-administered encounter medications on file as of 09/29/2023.    Social History: Social History   Tobacco Use  Smoking status: Former    Current packs/day: 0.50    Average packs/day: 0.5 packs/day for 35.0 years (17.5 ttl pk-yrs)    Types: Cigarettes   Smokeless tobacco: Never   Tobacco comments:    3-4 cigarettes daily. Trying to quit. HS.  Quit smoking 4 months ago.  Sneaks cigarettes.  12/31/2022 hfb  Vaping Use   Vaping status: Never Used  Substance Use Topics   Alcohol use: Not Currently    Alcohol/week: 1.0 standard drink of alcohol    Types: 1 Glasses of wine per week    Comment: 06/14/2014 glass of wine maybe once/month   Drug use: No    Family Medical History: Family History  Problem Relation Age of Onset   Cancer Mother    Cancer Father    Throat cancer Other    Hypertension Other    Stroke Other    Alcohol abuse Other    Arthritis Other    Cancer Other        lung cancer   Hypertension Other    Arthritis Other    Stroke Other    Breast cancer Neg Hx     Physical Examination: Vitals:   09/29/23 1505  BP: (!) 96/52    Awake, alert, oriented to person, place, and time.  Speech is clear and  fluent. Fund of knowledge is appropriate.   Cranial Nerves: Pupils equal round and reactive to light.  Facial tone is symmetric.    She has tenderness in T6-T7 region.   No abnormal lesions on exposed skin.   Strength: Side Biceps Triceps Deltoid Interossei Grip Wrist Ext. Wrist Flex.  R 5 5 5 5 5 5 5   L 5 5 5 5 5 5 5    Side Iliopsoas Quads Hamstring PF DF EHL  R 5 5 5 5 5 5   L 5 5 5 5 5 5    Reflexes are 2+ and symmetric at the biceps, brachioradialis, patella and achilles.   Hoffman's is absent.  Clonus is not present.   Bilateral upper and lower extremity sensation is intact to light touch.     Gait is normal.     Medical Decision Making  Imaging: None  Assessment and Plan: Ms. Dieguez has history of T7 compression fracture found on 03/01/23. She was doing well at her last visit.    She has been doing more cleaning lately and now has a 2 week history of mid thoracic pain that is constant. No radicular pain into ribs. No arm or leg pain.   Symptoms/exam are suspicious for thoracic compression fracture. She is on chronic prednisone .   Treatment options discussed with patient and following plan made:   - Thoracic xrays ordered. Will get Wednesday or Thursday.  - Small prescription of percocet sent to pharmacy. She takes rarely. PMP reviewed and is appropriate.  - Will review her xrays once done and message her with results.   Of note, her blood pressure was 96/52. She has documented low blood pressure. She denies any symptoms.   I spent a total of 25 minutes in face-to-face and non-face-to-face activities related to this patient's care today including review of outside records, review of imaging, review of symptoms, physical exam, discussion of differential diagnosis, discussion of treatment options, and documentation.   Glade Boys PA-C Dept. of Neurosurgery

## 2023-09-28 ENCOUNTER — Ambulatory Visit

## 2023-09-29 ENCOUNTER — Ambulatory Visit (INDEPENDENT_AMBULATORY_CARE_PROVIDER_SITE_OTHER): Admitting: Orthopedic Surgery

## 2023-09-29 ENCOUNTER — Encounter: Payer: Self-pay | Admitting: Orthopedic Surgery

## 2023-09-29 VITALS — BP 96/52 | Ht 61.0 in | Wt 108.0 lb

## 2023-09-29 DIAGNOSIS — M546 Pain in thoracic spine: Secondary | ICD-10-CM

## 2023-09-29 MED ORDER — OXYCODONE-ACETAMINOPHEN 7.5-325 MG PO TABS: 1.0000 | ORAL_TABLET | Freq: Three times a day (TID) | ORAL | 0 refills | Status: DC | PRN

## 2023-09-30 ENCOUNTER — Other Ambulatory Visit: Payer: Self-pay | Admitting: Internal Medicine

## 2023-10-01 ENCOUNTER — Ambulatory Visit
Admission: RE | Admit: 2023-10-01 | Discharge: 2023-10-01 | Disposition: A | Discharge status: Home / Self Care | Source: Ambulatory Visit | Attending: Orthopedic Surgery | Admitting: Orthopedic Surgery

## 2023-10-01 ENCOUNTER — Ambulatory Visit
Admission: RE | Admit: 2023-10-01 | Discharge: 2023-10-01 | Disposition: A | Discharge status: Home / Self Care | Attending: Orthopedic Surgery | Admitting: Orthopedic Surgery

## 2023-10-01 DIAGNOSIS — M546 Pain in thoracic spine: Secondary | ICD-10-CM | POA: Diagnosis not present

## 2023-10-01 DIAGNOSIS — M549 Dorsalgia, unspecified: Secondary | ICD-10-CM | POA: Diagnosis not present

## 2023-10-01 DIAGNOSIS — M4854XA Collapsed vertebra, not elsewhere classified, thoracic region, initial encounter for fracture: Secondary | ICD-10-CM | POA: Diagnosis not present

## 2023-10-02 ENCOUNTER — Encounter: Payer: Self-pay | Admitting: Orthopedic Surgery

## 2023-10-02 NOTE — Telephone Encounter (Signed)
 Thoracic xrays dated 10/01/23:  Previous T7 compression fracture seen with no other acute compression fractures noted.   Report not yet available for above xrays.

## 2023-10-05 ENCOUNTER — Encounter: Payer: Self-pay | Admitting: Orthopedic Surgery

## 2023-10-05 NOTE — Telephone Encounter (Signed)
 Thoracic xrays dated 10/01/23:  FINDINGS: Stable compression fracture involving the T7 vertebral body without retropulsion or canal compromise. The other thoracic vertebral bodies are maintained. No new fracture or bone lesion. No significant degenerative changes. No abnormal paraspinal soft tissue swelling.   The visualized lungs are grossly clear. Stable pacer wires and AICD.   IMPRESSION: 1. Stable T7 compression fracture. 2. No new fracture or bone lesion.     Electronically Signed   By: MYRTIS Stammer M.D.   On: 10/05/2023 11:40  I have personally reviewed the images and agree with the above interpretation.

## 2023-10-05 NOTE — Progress Notes (Signed)
Remote ICD Transmission.

## 2023-10-12 ENCOUNTER — Ambulatory Visit: Admitting: Physician Assistant

## 2023-10-14 ENCOUNTER — Telehealth: Payer: Self-pay | Admitting: *Deleted

## 2023-10-14 NOTE — Telephone Encounter (Signed)
 Received patient assistance paperwork for patient for Breztri .  Form signed by Madelin Stank NP and faxed to [employer] on 9/11/205.  Received confirmation fax that it was sent successfully.  Copy made and original sent to scan.

## 2023-10-16 NOTE — Telephone Encounter (Signed)
 Copied from CRM [id number]. Topic: Clinical - Medication Question >> Oct 15, 2023  2:39 PM Rozanna G wrote: Reason for CRM: Pt called to check on her paperwork for assistance with her Breztri  advised her of the message on 09/24. Stated she has 2 samples left and will be out. She wants to know if we have any more samples.  Called; Patient stated she already signed and faxed paperwork on 9/15 in [place]  ( awaiting approval). She said she will give our office a call for breztri  samples, when she's out. NFN

## 2023-10-28 ENCOUNTER — Other Ambulatory Visit

## 2023-11-02 ENCOUNTER — Other Ambulatory Visit: Payer: Self-pay | Admitting: *Deleted

## 2023-11-02 MED ORDER — BREZTRI AEROSPHERE 160-9-4.8 MCG/ACT IN AERO: 2.0000 | INHALATION_SPRAY | Freq: Two times a day (BID) | RESPIRATORY_TRACT | 0 refills | Status: DC

## 2023-11-02 MED ORDER — BREZTRI AEROSPHERE 160-9-4.8 MCG/ACT IN AERO: 2.0000 | INHALATION_SPRAY | Freq: Two times a day (BID) | RESPIRATORY_TRACT | 4 refills | Status: AC

## 2023-11-02 NOTE — Telephone Encounter (Signed)
 The patient called into the [facility] office asking to pick up Breztri  samples here.  I have placed 2 samples at the front desk. The patient is aware.  Nothing further needed.

## 2023-11-02 NOTE — Addendum Note (Signed)
 Addended by: VICCI EVALENE DEL on: 11/02/2023 02:12 PM   Modules accepted: Orders

## 2023-11-02 NOTE — Telephone Encounter (Signed)
 New prescription [phone]) to [employer] for Breztri .

## 2023-11-03 ENCOUNTER — Telehealth: Payer: Self-pay

## 2023-11-03 NOTE — Telephone Encounter (Signed)
 The patient came and picked up the samples yesterday (10/13).  Nothing further needed.

## 2023-11-03 NOTE — Telephone Encounter (Signed)
 Copied from CRM [id number]. Topic: Clinical - Medication Question >> Oct 30, 2023  8:53 AM Devaughn RAMAN wrote: Reason for CRM: Patient called to inquire if she can pick up samples of budesonide -glycopyrrolate-formoterol  (BREZTRI  AEROSPHERE) 160-9-4.8 MCG/ACT AERO inhaler while she waits for her medication to come in the mail as she is low and she is requesting them from the [place] office, patient is a pt of Dr.Wert. Contacted [facility] spoke with Luke and she advised to send a message and clinical staff will f/u with patient, advised patient was thankful and verbalized understanding. >> Nov 02, 2023  8:22 AM Essie A wrote: Patient is calling again to see if she can pick up samples of budesonide -glycopyrrolate-formoterol  (BREZTRI  AEROSPHERE) 160-9-4.8 MCG/ACT AERO inhaler while she waits for her medication to come in the mail as she is low.  She would like to pick up the samples from the [place] office because it's closer to her.  She is a patient of Dr. Darlean.  Please advise.

## 2023-11-05 ENCOUNTER — Other Ambulatory Visit: Payer: Self-pay | Admitting: Acute Care

## 2023-11-05 DIAGNOSIS — Z122 Encounter for screening for malignant neoplasm of respiratory organs: Secondary | ICD-10-CM

## 2023-11-05 DIAGNOSIS — Z87891 Personal history of nicotine dependence: Secondary | ICD-10-CM

## 2023-11-11 ENCOUNTER — Other Ambulatory Visit (HOSPITAL_COMMUNITY): Payer: Self-pay | Admitting: Family Medicine

## 2023-11-12 ENCOUNTER — Encounter: Admitting: Internal Medicine

## 2023-11-16 ENCOUNTER — Ambulatory Visit
Admission: RE | Admit: 2023-11-16 | Discharge: 2023-11-16 | Disposition: A | Discharge status: Home / Self Care | Source: Ambulatory Visit | Attending: Acute Care | Admitting: Acute Care

## 2023-11-16 DIAGNOSIS — Z87891 Personal history of nicotine dependence: Secondary | ICD-10-CM | POA: Diagnosis present

## 2023-11-16 DIAGNOSIS — Z122 Encounter for screening for malignant neoplasm of respiratory organs: Secondary | ICD-10-CM | POA: Insufficient documentation

## 2023-11-19 ENCOUNTER — Telehealth (HOSPITAL_BASED_OUTPATIENT_CLINIC_OR_DEPARTMENT_OTHER): Payer: Self-pay

## 2023-11-19 ENCOUNTER — Telehealth: Payer: Self-pay | Admitting: *Deleted

## 2023-11-19 NOTE — Telephone Encounter (Signed)
 I called and spoke with patient regarding the application for patient assistance for Breztri .  She states she has filled it out several times and the first time she was not given all the paperwork she needed to fill out.  She states she finally received the corrected paperwork from the [place] office and faxed it back to the [place] office.  I provided her with the phone number for [employer] so she could call and check the status of her application [phone]).  She said she had talked to the staff at the [facility] office about getting samples of Breztri  as she is about to run out.  I told her if she cannot get it there to call us  back and we can put some up front for her, but she will have to let us  know.  She verbalized understanding.

## 2023-11-19 NOTE — Telephone Encounter (Unsigned)
 Copied from CRM #[id number]. 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 Patient states for patient assistance was incorrect from [facility] and [place] office gave the correct forms, patient filled it out and is waiting for a response. Patient does not want to go to [facility] and asking to pick up from the [place] location to pick the samples. Per [employer], send crm to [facility] clinic for Dr. Darlean to approve for [facility] to provide samples. Patient states just spoke with [facility] and advised to contact [facility] for samples. Please advise and call back.

## 2023-11-19 NOTE — Telephone Encounter (Signed)
 Samples of Breztri  placed up front for patient to pick up on Monday 11/3.  Nothing further needed.

## 2023-11-19 NOTE — Telephone Encounter (Unsigned)
 Copied from CRM #[id number]. Topic: Clinical - Prescription Issue >> Nov 19, 2023 10:38 AM Ismael A wrote: Reason for CRM: patient states she would like to come and pick up some more samples for Breztri , she is waiting on [place] [employer] [place] [employer] company to give her a confirmation that they have this RX ready - she states she only has 2 days of the samples she has now - she wants to pick this up at the office in [place]

## 2023-11-20 ENCOUNTER — Telehealth: Payer: Self-pay

## 2023-11-20 DIAGNOSIS — R911 Solitary pulmonary nodule: Secondary | ICD-10-CM

## 2023-11-20 DIAGNOSIS — Z87891 Personal history of nicotine dependence: Secondary | ICD-10-CM

## 2023-11-20 NOTE — Telephone Encounter (Signed)
 Called patient to review recent Lung CT results. LVM.     IMPRESSION: 1. Lung-RADS 3, probably benign findings. Short-term follow-up in 6 months is recommended with repeat low-dose chest CT without contrast (please use the following order, CT CHEST LCS NODULE FOLLOW-UP W/O CM). New 5.3 mm solid posterior left upper lobe nodule. 2. Three-vessel coronary atherosclerosis. 3. Cirrhosis. 4. Aberrant right subclavian artery. 5. Aortic Atherosclerosis (ICD10-I70.0) and Emphysema (ICD10-J43.9).

## 2023-11-20 NOTE — Telephone Encounter (Signed)
 Call Report also received from Tiffany, report below.

## 2023-11-20 NOTE — Telephone Encounter (Signed)
samples

## 2023-11-20 NOTE — Telephone Encounter (Signed)
 Duplicate. Pt has picked up samples from out clinic 10/13 and 10/30. NFN. See other telephone encounters.

## 2023-11-20 NOTE — Telephone Encounter (Signed)
 Copied from CRM #[id number]. Topic: Clinical - Medication Question >> Nov 19, 2023 10:28 AM Devaughn RAMAN wrote: Reason for CRM: Pt called in regards to Breztri  medication, pt would like a follow up callback regarding this. >> Nov 19, 2023  2:56 PM Lavanda D wrote: Patient calling back for [facility] regarding the Breztri  samples. She would like the samples to be left for her - She is requesting permissions from [place] to allow [place] office to give her samples.   See phone notes

## 2023-11-20 NOTE — Telephone Encounter (Signed)
 Pt is picked up samples from our clinic yesterday 10/30. NFN.

## 2023-11-23 NOTE — Addendum Note (Signed)
 Addended by: ANITRA AQUAS D on: 11/23/2023 03:28 PM   Modules accepted: Orders

## 2023-11-23 NOTE — Telephone Encounter (Signed)
 LVM to call office to review recent CT results.

## 2023-11-23 NOTE — Telephone Encounter (Signed)
 Spoke with patient and reviewed lung screening CT results. One new small nodule noted . Recommendation is to repeat CT in 6 months. Pt verbalized understanding. Results/ plans faxed to PCP. Order placed for 6 month nodule follow up CT.

## 2023-11-25 ENCOUNTER — Other Ambulatory Visit (HOSPITAL_COMMUNITY): Payer: Self-pay | Admitting: Family Medicine

## 2023-11-26 IMAGING — CR DG CHEST 2V
2 series · 2 of 2 positions shown · non-contrast
Comparison: Chest CT 06/11/2020.

CLINICAL DATA: 76-year-old female with history of shortness of
breath. Productive cough. Green sputum production.

EXAM:
CHEST - 2 VIEW

[chest pa]
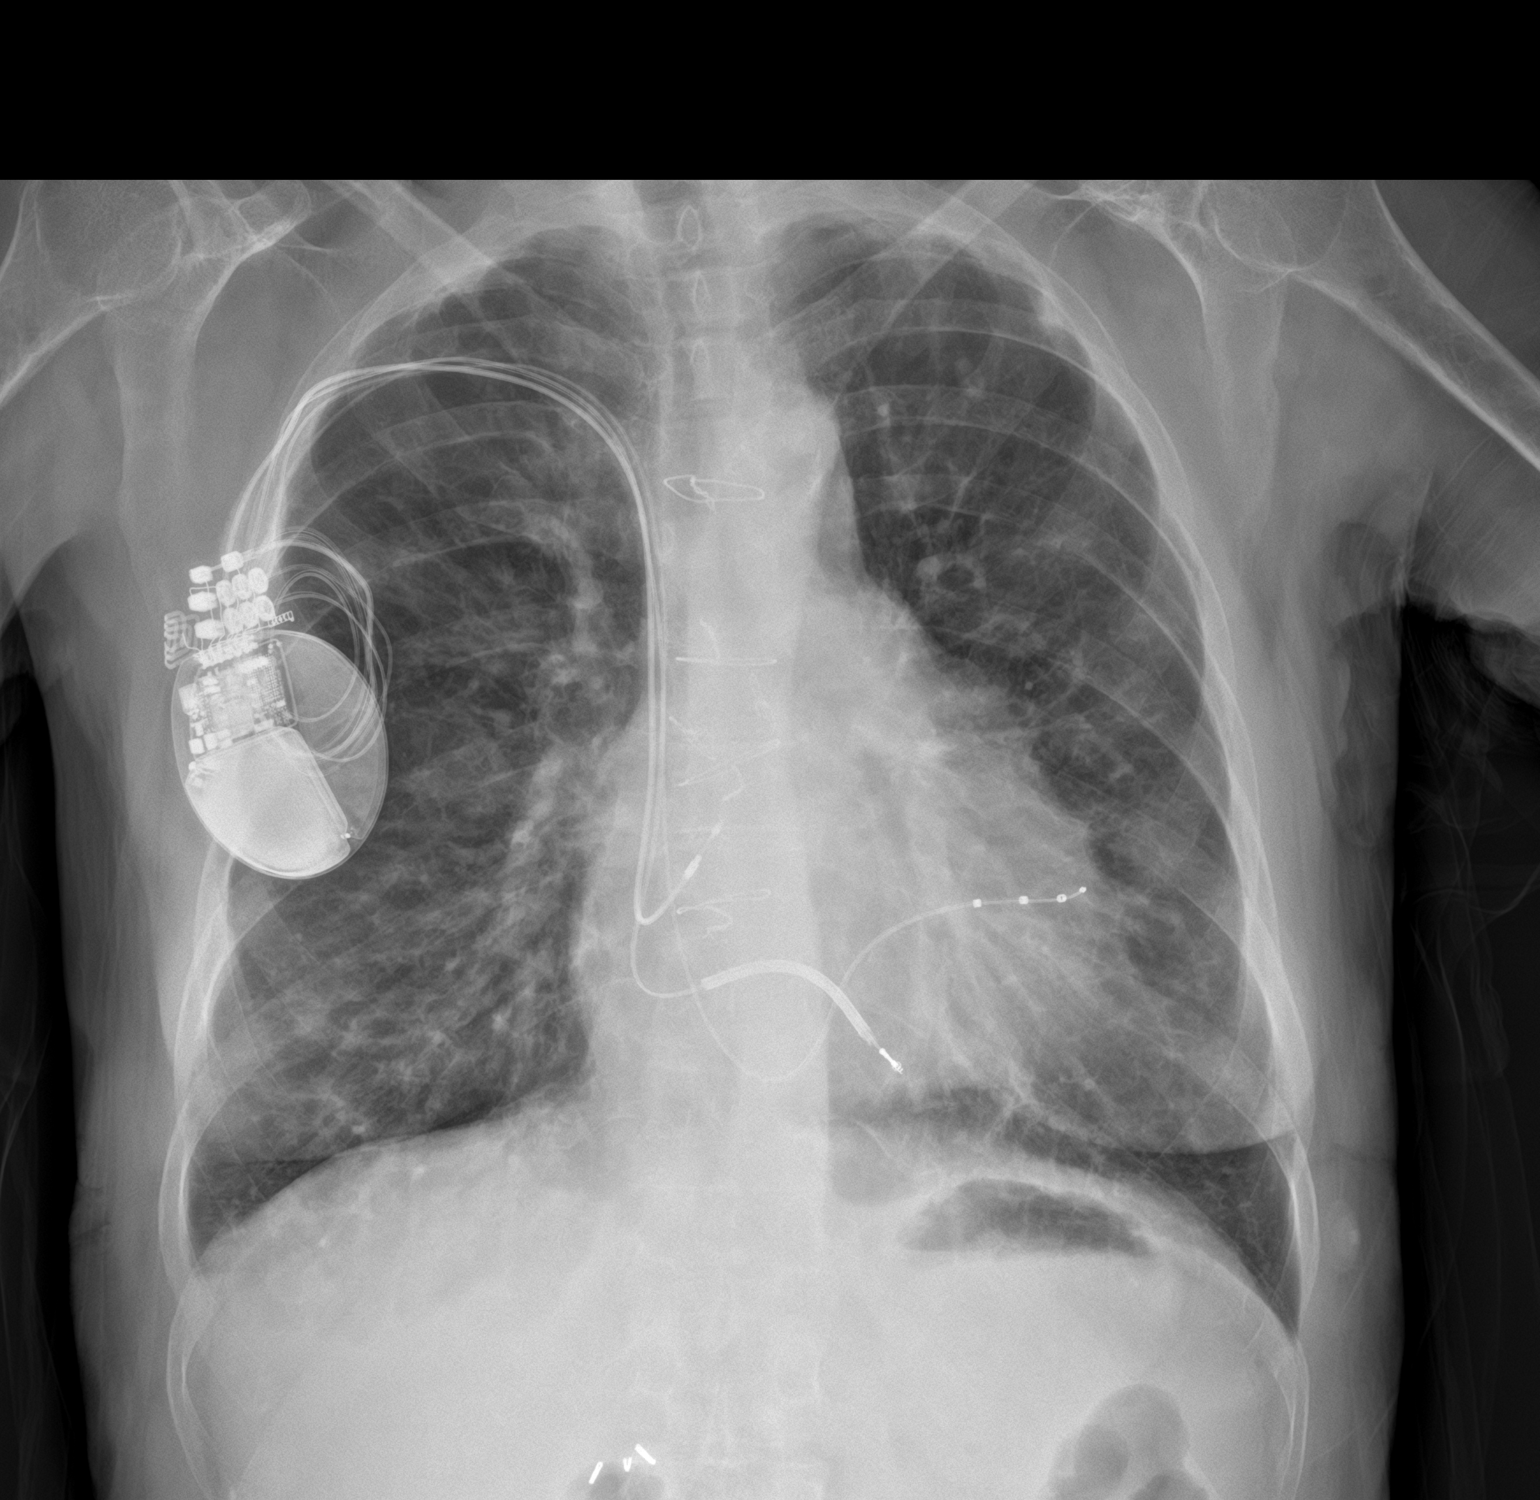

[chest lat]
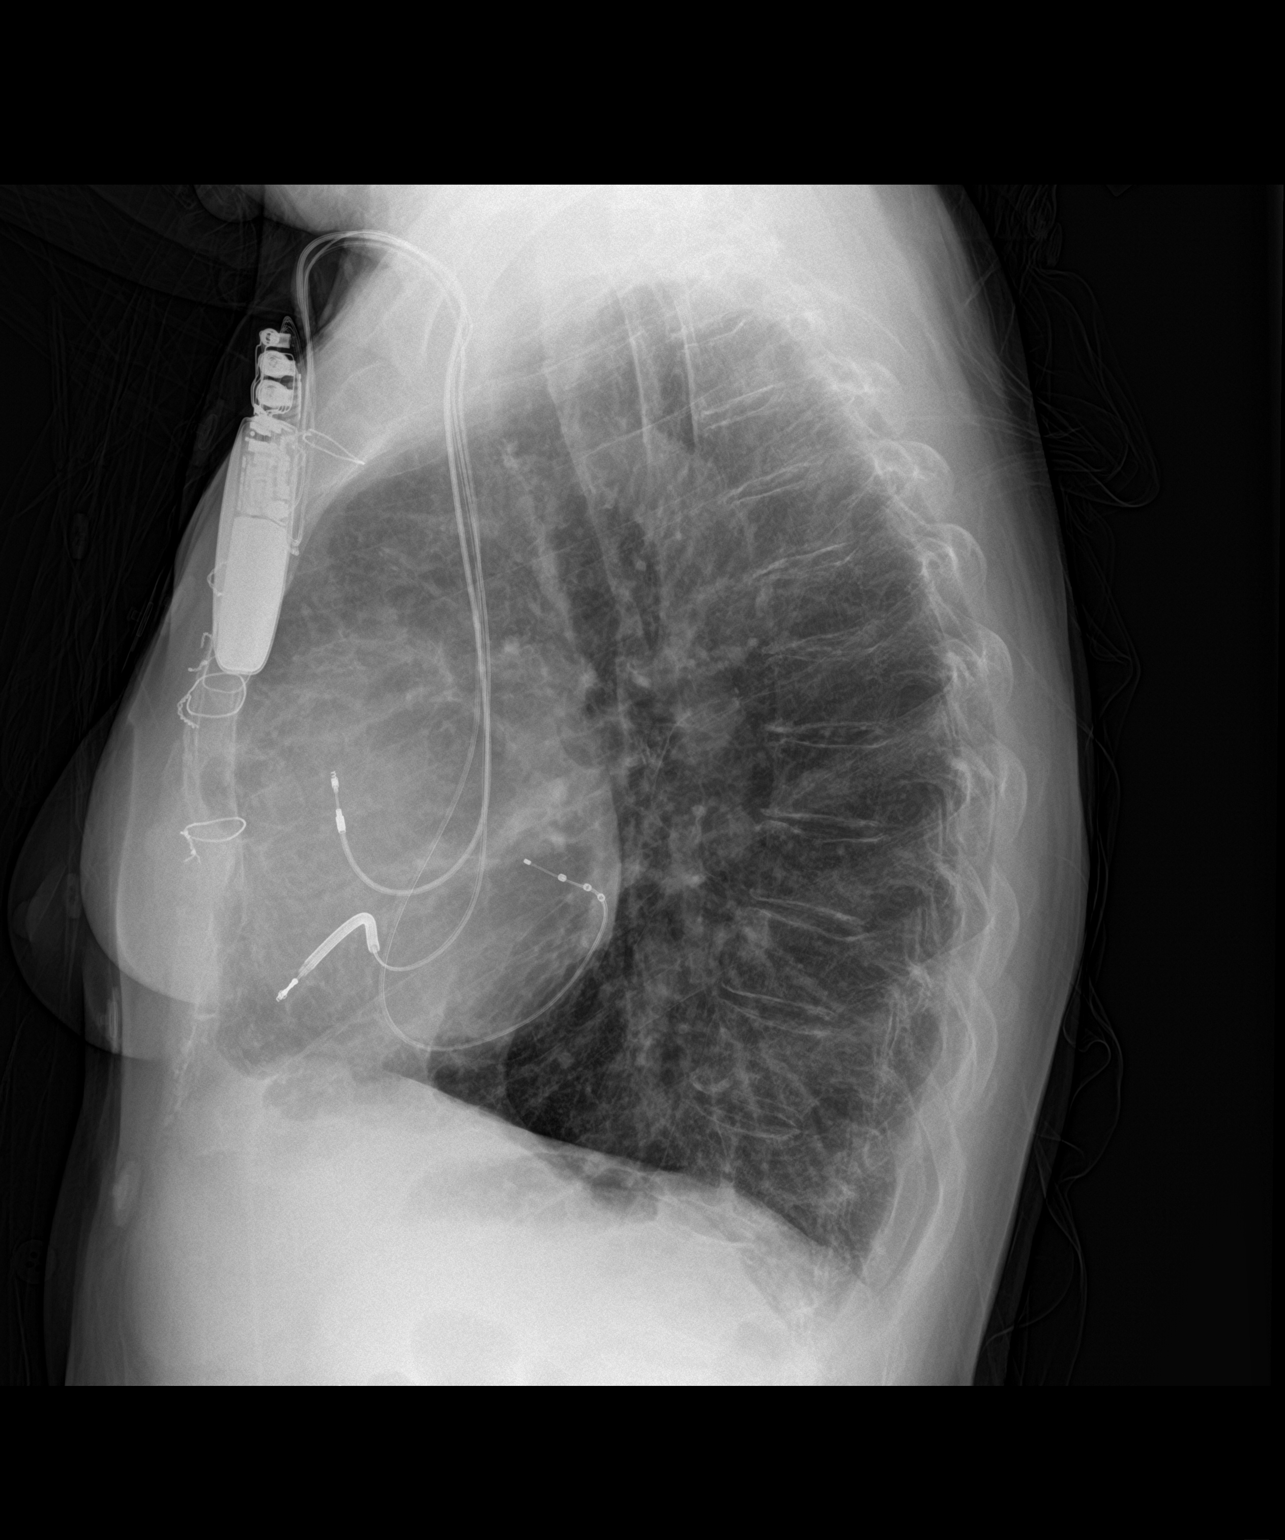

[2 of 2 positions shown; findings below may reference images not displayed]

FINDINGS: Lungs are hyperexpanded with emphysematous changes. Increased
interstitial markings and worsened diffuse peribronchial cuffing
when compared to prior examinations, concerning for an acute
bronchitis. No consolidative airspace disease. No pleural effusions.
No pneumothorax. No evidence of pulmonary edema. Heart size is
normal. Upper mediastinal contours are within normal limits.
Atherosclerotic calcifications are noted in the thoracic aorta.
Status post median sternotomy. Right-sided biventricular
pacemaker/AICD with lead tips projecting over the expected location
of the right atrium, right ventricle and lateral wall the left
ventricle via the coronary sinus and coronary veins.
IMPRESSION: 1. The appearance the chest is concerning for acute bronchitis.
2. Emphysema.
3. Aortic atherosclerosis.
4. Postoperative changes and support apparatus, as above.

## 2023-11-27 ENCOUNTER — Telehealth: Payer: Self-pay | Admitting: Orthopedic Surgery

## 2023-11-27 DIAGNOSIS — M546 Pain in thoracic spine: Secondary | ICD-10-CM

## 2023-11-27 MED ORDER — OXYCODONE-ACETAMINOPHEN 7.5-325 MG PO TABS: 1.0000 | ORAL_TABLET | Freq: Two times a day (BID) | ORAL | 0 refills | Status: DC | PRN

## 2023-11-27 NOTE — Telephone Encounter (Signed)
 Patient advised, patient states defibrillator is compatible with MRI and states she has had MRI scan done in the past since having defibrillator, she is ok with [facility] I explained to her the reason. She is aware of medication been sent to the pharmacy.

## 2023-11-27 NOTE — Telephone Encounter (Signed)
 Patient is calling to request something to help with her pain. She rates her pain at a 10/10 when standing. She is scheduled for a follow up on 12/30/2023.  [facility] [place]

## 2023-11-27 NOTE — Telephone Encounter (Signed)
 Pt called in to clarify which pharmacy it was sent to. Provided the information, pt will call pharmacy

## 2023-11-27 NOTE — Telephone Encounter (Signed)
 Spoke with patient. Pain still the same as it was during her last visit, not better. Pain is in the thoracic area-burning, stabbing pain and numbness in that area-some days better than others. Bending and walking makes it worse. She tries to stoop and not bend as was advised but sometimes catches herself bending. Laying down and sitting can make pain feel better. When she has severe stabbing pain flare up she would take oxyCODONE -acetaminophen  (PERCOCET) 7.5-325 MG tablet 1/2 tablet-she is out of this medication. She takes daily Prednisone  for her COPD, she has tried Tylenol  extra strength 1 to 3 tablets daily on the days she would take it and this does not help the pain. No leg or rib pain. No new weakness or numbness issues. She is not sure what else to do to help her pain and wanted to get more medication for the pain to take as needed. She is willing to proceed with MRI scan if needed for her back.

## 2023-11-27 NOTE — Addendum Note (Signed)
 Addended byBETHA HILMA HASTINGS on: 11/27/2023 11:52 AM   Modules accepted: Orders

## 2023-11-27 NOTE — Telephone Encounter (Signed)
 Recommend she have thoracic MRI done if pain is still severe.   Does she know if her defibrillator is MRI compatible? If she does not know, radiology will research it. MRI will need to be done at [facility] in [place].   Let me know.   I can send a few percocet to her pharmacy. PMP reviewed and is appropriate.

## 2023-11-27 NOTE — Telephone Encounter (Signed)
 Thoracic MRI ordered at [facility] in [place] since she has cardiac defbrillator.

## 2023-12-02 ENCOUNTER — Other Ambulatory Visit

## 2023-12-03 ENCOUNTER — Ambulatory Visit: Attending: Cardiology

## 2023-12-03 DIAGNOSIS — I5022 Chronic systolic (congestive) heart failure: Secondary | ICD-10-CM

## 2023-12-03 LAB — ECHOCARDIOGRAM COMPLETE
AR max vel: 1.85 cm2
AV Area VTI: 1.4 cm2
AV Area mean vel: 1.57 cm2
AV Mean grad: 6 mmHg
AV Peak grad: 11.3 mmHg
Ao pk vel: 1.68 m/s
Area-P 1/2: 3.08 cm2
S' Lateral: 4 cm

## 2023-12-07 ENCOUNTER — Encounter: Payer: Self-pay | Admitting: Internal Medicine

## 2023-12-07 ENCOUNTER — Ambulatory Visit: Admitting: Internal Medicine

## 2023-12-07 ENCOUNTER — Other Ambulatory Visit: Payer: Self-pay | Admitting: Internal Medicine

## 2023-12-07 ENCOUNTER — Ambulatory Visit: Payer: Self-pay | Admitting: Internal Medicine

## 2023-12-07 ENCOUNTER — Telehealth: Payer: Self-pay | Admitting: *Deleted

## 2023-12-07 VITALS — BP 100/62 | HR 67 | Temp 97.8°F | Ht 61.0 in | Wt 106.4 lb

## 2023-12-07 DIAGNOSIS — Z0001 Encounter for general adult medical examination with abnormal findings: Secondary | ICD-10-CM

## 2023-12-07 DIAGNOSIS — I10 Essential (primary) hypertension: Secondary | ICD-10-CM

## 2023-12-07 DIAGNOSIS — S22060D Wedge compression fracture of T7-T8 vertebra, subsequent encounter for fracture with routine healing: Secondary | ICD-10-CM

## 2023-12-07 DIAGNOSIS — J449 Chronic obstructive pulmonary disease, unspecified: Secondary | ICD-10-CM

## 2023-12-07 DIAGNOSIS — E538 Deficiency of other specified B group vitamins: Secondary | ICD-10-CM

## 2023-12-07 DIAGNOSIS — R739 Hyperglycemia, unspecified: Secondary | ICD-10-CM | POA: Diagnosis not present

## 2023-12-07 DIAGNOSIS — E785 Hyperlipidemia, unspecified: Secondary | ICD-10-CM | POA: Diagnosis not present

## 2023-12-07 DIAGNOSIS — Z0000 Encounter for general adult medical examination without abnormal findings: Secondary | ICD-10-CM

## 2023-12-07 DIAGNOSIS — I5022 Chronic systolic (congestive) heart failure: Secondary | ICD-10-CM

## 2023-12-07 DIAGNOSIS — E559 Vitamin D deficiency, unspecified: Secondary | ICD-10-CM | POA: Diagnosis not present

## 2023-12-07 DIAGNOSIS — N1832 Chronic kidney disease, stage 3b: Secondary | ICD-10-CM

## 2023-12-07 LAB — URINALYSIS, ROUTINE W REFLEX MICROSCOPIC
Hgb urine dipstick: NEGATIVE
Ketones, ur: NEGATIVE
Leukocytes,Ua: NEGATIVE
Nitrite: NEGATIVE
Specific Gravity, Urine: 1.025 (ref 1.000–1.030)
Total Protein, Urine: NEGATIVE
Urine Glucose: NEGATIVE
Urobilinogen, UA: 0.2 (ref 0.0–1.0)
pH: 5.5 (ref 5.0–8.0)

## 2023-12-07 LAB — CBC WITH DIFFERENTIAL/PLATELET
Basophils Absolute: 0.1 K/uL (ref 0.0–0.1)
Basophils Relative: 1.1 % (ref 0.0–3.0)
Eosinophils Absolute: 0.2 K/uL (ref 0.0–0.7)
Eosinophils Relative: 2.7 % (ref 0.0–5.0)
HCT: 35 % — ABNORMAL LOW (ref 36.0–46.0)
Hemoglobin: 11.7 g/dL — ABNORMAL LOW (ref 12.0–15.0)
Lymphocytes Relative: 20.7 % (ref 12.0–46.0)
Lymphs Abs: 1.2 K/uL (ref 0.7–4.0)
MCHC: 33.5 g/dL (ref 30.0–36.0)
MCV: 92.6 fl (ref 78.0–100.0)
Monocytes Absolute: 0.4 K/uL (ref 0.1–1.0)
Monocytes Relative: 7.6 % (ref 3.0–12.0)
Neutro Abs: 4 K/uL (ref 1.4–7.7)
Neutrophils Relative %: 67.9 % (ref 43.0–77.0)
Platelets: 242 K/uL (ref 150.0–400.0)
RBC: 3.78 Mil/uL — ABNORMAL LOW (ref 3.87–5.11)
RDW: 14.6 % (ref 11.5–15.5)
WBC: 5.9 K/uL (ref 4.0–10.5)

## 2023-12-07 LAB — BASIC METABOLIC PANEL WITH GFR
BUN: 21 mg/dL (ref 6–23)
CO2: 27 meq/L (ref 19–32)
Calcium: 9 mg/dL (ref 8.4–10.5)
Chloride: 103 meq/L (ref 96–112)
Creatinine, Ser: 1.66 mg/dL — ABNORMAL HIGH (ref 0.40–1.20)
GFR: 29.73 mL/min — ABNORMAL LOW (ref >60.00)
Glucose, Bld: 135 mg/dL — ABNORMAL HIGH (ref 70–99)
Potassium: 4.6 meq/L (ref 3.5–5.1)
Sodium: 139 meq/L (ref 135–145)

## 2023-12-07 LAB — HEPATIC FUNCTION PANEL
ALT: 12 U/L (ref 0–35)
AST: 17 U/L (ref 0–37)
Albumin: 4.3 g/dL (ref 3.5–5.2)
Alkaline Phosphatase: 64 U/L (ref 39–117)
Bilirubin, Direct: 0.2 mg/dL (ref 0.0–0.3)
Total Bilirubin: 0.9 mg/dL (ref 0.2–1.2)
Total Protein: 6.6 g/dL (ref 6.0–8.3)

## 2023-12-07 LAB — LIPID PANEL
Cholesterol: 142 mg/dL (ref 0–200)
HDL: 79.9 mg/dL (ref >39.00)
LDL Cholesterol: 51 mg/dL (ref 0–99)
NonHDL: 62.47
Total CHOL/HDL Ratio: 2
Triglycerides: 56 mg/dL (ref 0.0–149.0)
VLDL: 11.2 mg/dL (ref 0.0–40.0)

## 2023-12-07 LAB — VITAMIN D 25 HYDROXY (VIT D DEFICIENCY, FRACTURES): VITD: 12.1 ng/mL — ABNORMAL LOW (ref 30.00–100.00)

## 2023-12-07 LAB — VITAMIN B12: Vitamin B-12: 161 pg/mL — ABNORMAL LOW (ref 211–911)

## 2023-12-07 LAB — HEMOGLOBIN A1C: Hgb A1c MFr Bld: 6.1 % (ref 4.6–6.5)

## 2023-12-07 LAB — TSH: TSH: 0.91 u[IU]/mL (ref 0.35–5.50)

## 2023-12-07 NOTE — Telephone Encounter (Signed)
 Patient assistance forms for [employer] completed and signed.  Forms faxed to [phone].  Received confirmation fax that it was sent successfully.  Copy made and placed in Tammy cabinet in B pod.  Original sent to scan.  Nothing further needed.

## 2023-12-07 NOTE — Assessment & Plan Note (Signed)
Volume stable, cont current med tx

## 2023-12-07 NOTE — Patient Instructions (Signed)
 Please continue all other medications as before, including the xanax  now down to bedtime  Please have the pharmacy call with any other refills you may need.  Please continue your efforts at being more active, low cholesterol diet, and weight control.  You are otherwise up to date with prevention measures today.  Please keep your appointments with your specialists as you may have planned - the Neurosurgury for the back pain  Please go to the LAB at the blood drawing area for the tests to be done  You will be contacted by phone if any changes need to be made immediately.  Otherwise, you will receive a letter about your results with an explanation, but please check with MyChart first.  Please make an Appointment to return in 6 months, or sooner if needed

## 2023-12-07 NOTE — Assessment & Plan Note (Signed)
Last vitamin D Lab Results  Component Value Date   VD25OH 22.88 (L) 07/30/2022   Low, to start oral replacement

## 2023-12-07 NOTE — Assessment & Plan Note (Signed)
 Age and sex appropriate education and counseling updated with regular exercise and diet Referrals for preventative services - none needed Immunizations addressed - for tdap and shingrix at pharmacy Smoking counseling  - none needed Evidence for depression or other mood disorder - chronic anxiety, improved it seems now xanax  weaned down to at bedtime prn only Most recent labs reviewed. I have personally reviewed and have noted: 1) the patient's medical and social history 2) The patient's current medications and supplements 3) The patient's height, weight, and BMI have been recorded in the chart

## 2023-12-07 NOTE — Assessment & Plan Note (Signed)
Lab Results  Component Value Date   HGBA1C 5.8 07/30/2022   Stable, pt to continue current medical treatment  - diet, wt control

## 2023-12-07 NOTE — Assessment & Plan Note (Signed)
 Stable today, cont inhaler asd

## 2023-12-07 NOTE — Progress Notes (Signed)
 Patient ID: Kimberly [name], female   DOB: 1945/07/12, 78 y.o.   MRN: [id number]         Chief Complaint:: wellness exam and compression fx T7, copd, chf, low vit d, hyperglycemia, htn, hld, low b12       HPI:  Kimberly [name] is a 78 y.o. female here for wellness exam; for tdap and shingrix at pharmacy, o/w up to date                        Also pt with severe cough episode then T7 compression fx followed per surgury, has declines kyphoplasty, has had limited percocet for pain, pt asks for fill here, but is not drug seeking.  Has known osteoporosis followed by GYN with DXA there.  Pt denies chest pain, increased sob or doe, wheezing, orthopnea, PND, increased LE swelling, palpitations, dizziness or syncope.   Pt denies polydipsia, polyuria, or new focal neuro s/s.    Pt denies fever, wt loss, night sweats, loss of appetite, or other constitutional symptoms     Wt Readings from Last 3 Encounters:  12/07/23 106 lb 6 oz (48.3 kg)  09/29/23 108 lb (49 kg)  09/22/23 108 lb 9.6 oz (49.3 kg)   BP Readings from Last 3 Encounters:  12/07/23 100/62  09/29/23 (!) 96/52  09/22/23 (!) 92/54   Immunization History  Administered Date(s) Administered   Fluad Quad(high Dose 65+) 11/27/2021   Fluad Trivalent(High Dose 65+) 11/05/2022   INFLUENZA, HIGH DOSE SEASONAL PF 09/30/2018   Influenza Split 10/15/2020   Influenza,inj,Quad PF,6+ Mos 10/17/2015   Influenza,inj,quad, With Preservative 11/17/2016   Influenza-Unspecified 09/20/2013, 10/13/2017, 09/30/2018, 10/13/2019, 10/13/2019   PFIZER(Purple Top)SARS-COV-2 Vaccination 03/13/2019, 04/05/2019   Pneumococcal Conjugate-13 02/20/2010, 11/23/2012   Pneumococcal Polysaccharide-23 10/27/2017   Tdap 02/20/2009   Health Maintenance Due  Topic Date Due   Zoster Vaccines- Shingrix (1 of 2) Never done   DTaP/Tdap/Td (2 - Td or Tdap) 02/21/2019   Medicare Annual Wellness (AWV)  12/03/2022      Past Medical History:  Diagnosis Date   AICD (automatic  cardioverter/defibrillator) present 03/17/2017   Anxiety    Atrial tachycardia    Basal cell carcinoma    R ant thigh, L pretibia, both txted at [facility]   Bursitis of shoulder, right, adhesive    Cancer (HCC)    CHF (congestive heart failure) (HCC)    Chronic bronchitis (HCC)    1-2 times/yr (01/23/2014)   COPD (chronic obstructive pulmonary disease) (HCC)    CVD (cerebrovascular disease)    Dyslipidemia    Dysrhythmia    Frequency of urination    GERD (gastroesophageal reflux disease)    Heart murmur    History of stomach ulcers    HTN (hypertension) 02/22/2011   Migraines    stopped many years ago (06/14/2014)   Osteoporosis 08/19/2016   Pericarditis    Pneumonia 10 times (06/14/2014)   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)    Silent myocardial infarction [facility]) late 1990's   Stress incontinence    was suppose to have been tacked up years ago but I didn't do it   Syncope, near    Associated with atrial tachycardia-event recorder 1/16   Thoracic outlet syndrome    VSD (ventricular septal defect)    Past Surgical History:  Procedure Laterality Date   ABDOMINAL AORTOGRAM W/LOWER EXTREMITY N/A 12/17/2016   Procedure: ABDOMINAL AORTOGRAM W/LOWER EXTREMITY;  Surgeon: Darron Deatrice LABOR,  MD;  Location: [facility] INVASIVE CV LAB;  Service: Cardiovascular;  Laterality: N/A;   BIV ICD INSERTION CRT-D N/A 03/17/2017   Procedure: BIV ICD INSERTION CRT-D;  Surgeon: Waddell Danelle ORN, MD;  Location: [facility] INVASIVE CV LAB;  Service: Cardiovascular;  Laterality: N/A;   CARDIAC CATHETERIZATION  quite a few   CESAREAN SECTION  1972   CHOLECYSTECTOMY OPEN  1970's   CORONARY ANGIOGRAM  2000   No significant CAD   ELECTROPHYSIOLOGIC STUDY N/A 06/14/2014   Procedure: A-Flutter/A-Tach/SVT Ablation;  Surgeon: Danelle ORN Waddell, MD;  Location: [facility] INVASIVE CV LAB;  Service: Cardiovascular;  Laterality: N/A;   INSERTION OF ICD  03/17/2017   BIV   MULTIPLE  TOOTH EXTRACTIONS     MYRINGOTOMY WITH TUBE PLACEMENT Right 2015   PERIPHERAL VASCULAR INTERVENTION  12/17/2016   Procedure: PERIPHERAL VASCULAR INTERVENTION;  Surgeon: Darron Deatrice LABOR, MD;  Location: [facility] INVASIVE CV LAB;  Service: Cardiovascular;;  Right common femoral PTA and Stent   RIGHT/LEFT HEART CATH AND CORONARY ANGIOGRAPHY Bilateral 11/06/2016   Procedure: RIGHT/LEFT HEART CATH AND CORONARY ANGIOGRAPHY;  Surgeon: Darron Deatrice LABOR, MD;  Location: [facility] INVASIVE CV LAB;  Service: Cardiovascular;  Laterality: Bilateral;   SVT ABLATION  06/14/2014   TUBAL LIGATION  1972   VSD REPAIR  1958; 1967    reports that she has quit smoking. Her smoking use included cigarettes. She has a 17.5 pack-year smoking history. She has never used smokeless tobacco. She reports that she does not currently use alcohol after a past usage of about 1.0 standard drink of alcohol per week. She reports that she does not use drugs. family history includes Alcohol abuse in an other family member; Arthritis in some other family members; Cancer in her father, mother, and another family member; Hypertension in some other family members; Stroke in some other family members; Throat cancer in an other family member. Allergies  Allergen Reactions   Mupirocin  Shortness Of Breath and Other (See Comments)    Burning, pain, swelling and sob   Codeine Nausea Only   Current Outpatient Medications on File Prior to Visit  Medication Sig Dispense Refill   albuterol  (PROVENTIL ) (2.5 MG/3ML) 0.083% nebulizer solution Take 3 mLs (2.5 mg total) by nebulization every 6 (six) hours as needed for wheezing or shortness of breath. TAKE 3 MLS (1 VIAL) BY NEBULIZATION EVERY 6 HOURS AS NEEDED FOR WHEEZING OR SHORTNESS OF BREATH 90 mL 5   albuterol  (VENTOLIN  HFA) 108 (90 Base) MCG/ACT inhaler Inhale 2 puffs into the lungs every 6 (six) hours as needed for wheezing or shortness of breath. 8.5 g 5   alendronate  (FOSAMAX ) 70 MG tablet Take 1 tablet  (70 mg total) by mouth every 7 (seven) days. Take with a full glass of water on an empty stomach. 12 tablet 3   ALPRAZolam  (XANAX ) 0.5 MG tablet TAKE 1 TABLET BY MOUTH TWICE PER DAY AS NEEDED 60 tablet 5   aspirin  81 MG tablet Take 1 tablet (81 mg total) by mouth daily. 100 tablet 99   bisoprolol  (ZEBETA ) 5 MG tablet TAKE 1/2 TABLET (2.5 MG TOTAL) BY MOUTH DAILY 45 tablet 1   Budeson-Glycopyrrol-Formoterol  (BREZTRI  AEROSPHERE) 160-9-4.8 MCG/ACT AERO Inhale 2 puffs into the lungs in the morning and at bedtime. 1 each 11   budesonide  (PULMICORT ) 0.25 MG/2ML nebulizer solution One vial twice daily thru nebulizer with albuterol  60 mL 12   budesonide -glycopyrrolate-formoterol  (BREZTRI  AEROSPHERE) 160-9-4.8 MCG/ACT AERO inhaler Inhale 2 puffs into the lungs in the morning and at  bedtime. 11.8 g 0   budesonide -glycopyrrolate-formoterol  (BREZTRI  AEROSPHERE) 160-9-4.8 MCG/ACT AERO inhaler Inhale 2 puffs into the lungs in the morning and at bedtime. 3 each 4   digoxin  (LANOXIN ) 0.125 MG tablet Take 0.5 tablets (0.0625 mg total) by mouth daily. PLEASE SCHEDULE APPOINTMENT FOR MORE REFILLS [phone] OPTION 2 45 tablet 0   furosemide  (LASIX ) 20 MG tablet Take 2 tablets (40 mg total) by mouth 2 (two) times daily. (Patient taking differently: Take 20 mg by mouth 2 (two) times daily.) 120 tablet 3   oxyCODONE -acetaminophen  (PERCOCET) 7.5-325 MG tablet Take 1 tablet by mouth every 12 (twelve) hours as needed for severe pain (pain score 7-10). 10 tablet 0   predniSONE  (DELTASONE ) 10 MG tablet 2 daily until breathing better then 1 x 5 days then one half daily thereafter 100 tablet 1   rosuvastatin  (CRESTOR ) 10 MG tablet Take 1 tablet (10 mg total) by mouth daily. 90 tablet 3   sacubitril -valsartan  (ENTRESTO ) 24-26 MG Take 1 tablet by mouth 2 (two) times daily. 60 tablet 11   spironolactone  (ALDACTONE ) 25 MG tablet Take 1 tablet (25 mg total) by mouth at bedtime. 30 tablet 6   tizanidine  (ZANAFLEX ) 2 MG capsule Take 2  mg by mouth as needed for muscle spasms.     No current facility-administered medications on file prior to visit.        ROS:  All others reviewed and negative.  Objective        PE:  BP 100/62   Pulse 67   Temp 97.8 F (36.6 C) (Temporal)   Ht 5' 1 (1.549 m)   Wt 106 lb 6 oz (48.3 kg)   SpO2 92%   BMI 20.10 kg/m                 Constitutional: Pt appears in NAD               HENT: Head: NCAT.                Right Ear: External ear normal.                 Left Ear: External ear normal.                Eyes: . Pupils are equal, round, and reactive to light. Conjunctivae and EOM are normal               Nose: without d/c or deformity               Neck: Neck supple. Gross normal ROM               Cardiovascular: Normal rate and regular rhythm.                 Pulmonary/Chest: Effort normal and breath sounds without rales or wheezing.                Abd:  Soft, NT, ND, + BS, no organomegaly               Neurological: Pt is alert. At baseline orientation, motor grossly intact               Skin: Skin is warm. No rashes, no other new lesions, LE edema - none               Psychiatric: Pt behavior is normal without agitation   Micro: none  Cardiac tracings I have personally interpreted today:  none  Pertinent Radiological findings (summarize): none   Lab Results  Component Value Date   WBC 9.8 03/01/2023   HGB 12.9 03/01/2023   HCT 39.2 03/01/2023   PLT 311 03/01/2023   GLUCOSE 108 (H) 09/08/2023   CHOL 200 02/26/2023   TRIG 80 02/26/2023   HDL 118 02/26/2023   LDLDIRECT 112.6 03/16/2013   LDLCALC 66 02/26/2023   ALT 22 03/01/2023   AST 23 03/01/2023   NA 139 09/08/2023   K 4.4 09/08/2023   CL 103 09/08/2023   CREATININE 1.22 (H) 09/08/2023   BUN 32 (H) 09/08/2023   CO2 27 09/08/2023   TSH 0.86 07/30/2022   INR 1.02 12/15/2016   HGBA1C 5.8 07/30/2022   Assessment/Plan:  Kimberly [name] is a 78 y.o. White or Caucasian [1] female with  has a past medical history  of AICD (automatic cardioverter/defibrillator) present (03/17/2017), Anxiety, Atrial tachycardia, Basal cell carcinoma, Bursitis of shoulder, right, adhesive, Cancer (HCC), CHF (congestive heart failure) (HCC), Chronic bronchitis (HCC), COPD (chronic obstructive pulmonary disease) (HCC), CVD (cerebrovascular disease), Dyslipidemia, Dysrhythmia, Frequency of urination, GERD (gastroesophageal reflux disease), Heart murmur, History of stomach ulcers, HTN (hypertension) (02/22/2011), Migraines, Osteoporosis (08/19/2016), Pericarditis, Pneumonia (10 times (06/14/2014)), 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), Silent myocardial infarction (HCC) (late 1990's), Stress incontinence, Syncope, near, Thoracic outlet syndrome, and VSD (ventricular septal defect).  Encounter for well adult exam with abnormal findings Age and sex appropriate education and counseling updated with regular exercise and diet Referrals for preventative services - none needed Immunizations addressed - for tdap and shingrix at pharmacy Smoking counseling  - none needed Evidence for depression or other mood disorder - chronic anxiety, improved it seems now xanax  weaned down to at bedtime prn only Most recent labs reviewed. I have personally reviewed and have noted: 1) the patient's medical and social history 2) The patient's current medications and supplements 3) The patient's height, weight, and BMI have been recorded in the chart   COPD (chronic obstructive pulmonary disease) (HCC) Stable today, cont inhaler asd  Chronic systolic CHF (congestive heart failure) (HCC) Volume stable, cont current med tx  Wedge compression fracture of T7 vertebra with routine healing Pt has persistent pain, but is directed to her surgical provider who has previously rx percocet prn; cont fosamax  70 mg weekly as pt prefers for now  Vitamin D  deficiency Last vitamin D  Lab Results  Component Value Date   VD25OH  22.88 (L) 07/30/2022   Low, to start oral replacement   Hyperglycemia Lab Results  Component Value Date   HGBA1C 5.8 07/30/2022   Stable, pt to continue current medical treatment  - diet, wt control   HTN (hypertension) BP Readings from Last 3 Encounters:  12/07/23 100/62  09/29/23 (!) 96/52  09/22/23 (!) 92/54   Stable, pt to continue medical treatment zebeta  5 mg qd   Dyslipidemia Lab Results  Component Value Date   LDLCALC 66 02/26/2023   Stable, pt to continue current statin crestor  10 mg qd   B12 deficiency Lab Results  Component Value Date   VITAMINB12 163 (L) 07/30/2022   Low, to start oral replacement - b12 1000 mcg every day, f/u lab today  Followup: Return in about 6 months (around 06/05/2024).  Lynwood Rush, MD 12/07/2023 12:48 PM [facility]

## 2023-12-07 NOTE — Assessment & Plan Note (Signed)
 BP Readings from Last 3 Encounters:  12/07/23 100/62  09/29/23 (!) 96/52  09/22/23 (!) 92/54   Stable, pt to continue medical treatment zebeta  5 mg qd

## 2023-12-07 NOTE — Assessment & Plan Note (Addendum)
 Pt has persistent pain, but is directed to her surgical provider who has previously rx percocet prn; cont fosamax  70 mg weekly as pt prefers for now

## 2023-12-07 NOTE — Assessment & Plan Note (Signed)
 Lab Results  Component Value Date   VITAMINB12 163 (L) 07/30/2022   Low, to start oral replacement - b12 1000 mcg every day, f/u lab today

## 2023-12-07 NOTE — Assessment & Plan Note (Signed)
 Lab Results  Component Value Date   LDLCALC 66 02/26/2023   Stable, pt to continue current statin crestor  10 mg qd

## 2023-12-07 NOTE — Telephone Encounter (Signed)
 Received patient assistance form from patient.  Placed at [employer] NP's desk for signature.

## 2023-12-08 ENCOUNTER — Other Ambulatory Visit: Payer: Self-pay | Admitting: Internal Medicine

## 2023-12-08 DIAGNOSIS — J449 Chronic obstructive pulmonary disease, unspecified: Secondary | ICD-10-CM

## 2023-12-14 ENCOUNTER — Ambulatory Visit: Admitting: Internal Medicine

## 2023-12-14 ENCOUNTER — Telehealth: Payer: Self-pay

## 2023-12-14 DIAGNOSIS — J449 Chronic obstructive pulmonary disease, unspecified: Secondary | ICD-10-CM

## 2023-12-14 NOTE — Telephone Encounter (Signed)
 Copied from CRM #[id number]. Topic: Clinical - Order For Equipment >> Dec 14, 2023  8:46 AM Kimberly [name] wrote: Reason for CRM: Reason for CRM: Pt called in regards to oxygen . Pt stated [employer] sent her a letter and advised her oxygen  is up for renewal and she needs a new prescription for the oxygen . Pt stated the letter advised if she does not comply they will charge her $3.990.00 for the oxygen . Please f/u with pt regarding this.   Pt states [employer] needs an order stating pt is to continue o2 at bedtime or they are going to take her oxygen .  I have placed a new order per pt request.  Per lov Continue with 2lpm at bedtime but no need for daytime as long as 02 sats over 90%

## 2023-12-16 ENCOUNTER — Ambulatory Visit: Payer: Medicare Other

## 2023-12-16 DIAGNOSIS — I428 Other cardiomyopathies: Secondary | ICD-10-CM

## 2023-12-18 LAB — CUP PACEART REMOTE DEVICE CHECK
Battery Remaining Longevity: 54 mo
Battery Remaining Percentage: 62 %
Brady Statistic RA Percent Paced: 59 %
Brady Statistic RV Percent Paced: 93 %
Date Time Interrogation Session: 20251126041100
HighPow Impedance: 80 Ohm
Lead Channel Impedance Value: 468 Ohm
Lead Channel Impedance Value: 538 Ohm
Lead Channel Impedance Value: 744 Ohm
Lead Channel Setting Pacing Amplitude: 2 V
Lead Channel Setting Pacing Amplitude: 2.5 V
Lead Channel Setting Pacing Amplitude: 2.6 V
Lead Channel Setting Pacing Pulse Width: 0.4 ms
Lead Channel Setting Pacing Pulse Width: 0.4 ms
Lead Channel Setting Sensing Sensitivity: 0.5 mV
Lead Channel Setting Sensing Sensitivity: 1 mV
Pulse Gen Serial Number: 204176
Zone Setting Status: 755011

## 2023-12-21 NOTE — Progress Notes (Signed)
 Remote ICD Transmission

## 2023-12-22 ENCOUNTER — Telehealth: Payer: Self-pay

## 2023-12-22 NOTE — Telephone Encounter (Signed)
 Received fax from [employer] requesting updated Breztri  enrollment forms. Called and spoke to pt and advised that she has a portion to fill out. Pt expressed her frustration, as she states that she has filled out these forms multiple times. After finding the original forms, it appears that these were not the updated forms that [employer] requires.   Pt would like to fill out forms at upcoming appt on 01/04/24 with Tammy Parrett. Forms were placed in the black file folder in B Pod by Heather's desk.

## 2023-12-24 ENCOUNTER — Telehealth (HOSPITAL_COMMUNITY): Payer: Self-pay | Admitting: Cardiology

## 2023-12-24 ENCOUNTER — Ambulatory Visit: Payer: Self-pay | Admitting: Internal Medicine

## 2023-12-24 NOTE — Telephone Encounter (Signed)
 Were meds ordered for PRN or scheduled?  They can cause sedation and low blood pressure. Would avoid taking with her xanax  and oxycodone .

## 2023-12-24 NOTE — Telephone Encounter (Signed)
 Patient called to report she was seen by Dr.Traviserel At [facility] for spinal injury 12/3.  Was given RX for tramadol  and tizanidine  for pain relief. Package insert reports medication can cause SOB and low b/p, with cardiac history would like to confirm on to start meds above.   Please advise

## 2023-12-24 NOTE — Telephone Encounter (Signed)
 Patient aware

## 2023-12-29 ENCOUNTER — Telehealth: Payer: Self-pay

## 2023-12-29 ENCOUNTER — Ambulatory Visit (HOSPITAL_COMMUNITY)

## 2023-12-29 NOTE — Telephone Encounter (Signed)
 Copied from CRM [id number]. Topic: Clinical - Order For Equipment >> Dec 28, 2023  4:03 PM Isabell A wrote: Reason for CRM: Patient states [employer] is going to charge her almost $4k for her oxygen  if her provider does not call in a prescription for a refill. Requesting a call back from the nurse.   Callback number: [phone]    Spoke with patient has an appointment to try and re qualify for her O2 since LOV she never dropped when walked    -NFN

## 2023-12-30 ENCOUNTER — Ambulatory Visit: Admitting: Orthopedic Surgery

## 2024-01-04 ENCOUNTER — Ambulatory Visit: Admitting: Adult Health

## 2024-01-07 ENCOUNTER — Ambulatory Visit: Admitting: Adult Health

## 2024-01-07 ENCOUNTER — Encounter: Payer: Self-pay | Admitting: Adult Health

## 2024-01-07 VITALS — BP 106/52 | HR 59 | Temp 97.7°F | Ht 61.0 in | Wt 106.8 lb

## 2024-01-07 DIAGNOSIS — R911 Solitary pulmonary nodule: Secondary | ICD-10-CM

## 2024-01-07 DIAGNOSIS — J9611 Chronic respiratory failure with hypoxia: Secondary | ICD-10-CM

## 2024-01-07 DIAGNOSIS — E440 Moderate protein-calorie malnutrition: Secondary | ICD-10-CM | POA: Diagnosis not present

## 2024-01-07 DIAGNOSIS — Z9981 Dependence on supplemental oxygen: Secondary | ICD-10-CM

## 2024-01-07 DIAGNOSIS — J441 Chronic obstructive pulmonary disease with (acute) exacerbation: Secondary | ICD-10-CM | POA: Diagnosis not present

## 2024-01-07 MED ORDER — BREZTRI AEROSPHERE 160-9-4.8 MCG/ACT IN AERO: 2.0000 | INHALATION_SPRAY | Freq: Two times a day (BID) | RESPIRATORY_TRACT | Status: DC

## 2024-01-07 NOTE — Patient Instructions (Addendum)
 Breztri  2 puffs Twice daily, rinse after use.  Completed the Breztri  paperwork today Albuterol  inhaler /neb As needed   Continue on Oxygen  2l/m with activity and At bedtime  .  Robitussin DM Twice daily  As needed   Begin Prednisone  10mg  daily for 1 week then Prednisone  5 mg daily .  Follow up with Dr. Darlean or Marquavious Nazar NP  in 3-4 months and As needed

## 2024-01-07 NOTE — Telephone Encounter (Signed)
 Forms were filled out at patient's office visit on 01/07/24. Both the patient and Madelin Stank, NP signed.   Forms were faxed back to [facility] and [place] at [phone].  Awaiting fax confirmation.

## 2024-01-07 NOTE — Telephone Encounter (Signed)
 Fax confirmation received. NFN

## 2024-01-07 NOTE — Progress Notes (Signed)
 @Patient  ID: Kimberly [name], female    DOB: [birth year], 78 y.o.   MRN: [id number]  Chief Complaint  Patient presents with   Medical Management of Chronic Issues    COPD f/u    Referring provider: [name], MD  HPI: 78 year old female former smoker quit in 2024 followed for severe COPD Medical history significant for peripheral vascular disease, cardiomyopathy with ICD, SVT, congestive heart failure, VSD status post repair, cirrhosis Hospitalization community-acquired pneumonia December 2024 started on oxygen  2 L at discharge Participates in the lung cancer CT chest screening program    TEST/EVENTS : Reviewed 01/07/2024  01/12/2017 PFTs: FVC 55, FEV1 37, ratio 54, TLC 93, DLCOunc 47. 05/26/2019 cardiopulmonary stress test: Submaximal, moderate HF limitation but more significant pulmonary limitation 09/26/2020 echocardiogram: EF 35-40 percent.  No definite residual VSD.  G1 DD.  RV function mildly reduced.  Normal PASP.  Trivial MR. 05/12/2021 CTA chest: No evidence of PE.  Mild cardiomegaly.  Atherosclerosis present.  There are scattered tree-in-bud opacities within the right lower lobe, likely infection.  Moderate centrilobular emphysema is present.  Nodular hepatic contour, consistent with cirrhosis. 07/29/2021 CXR 2 view: Both lungs are clear.  Lungs are hyperinflated with mild biapical pleural thickening which is unchanged.  No acute process. 04/24/2022 CXR: hyperinflation. No acute process.  08/18/2022 PFT: FVC 59, FEV1 35, ratio 46, TLC 163, DLCO 51. Severe obstructive disease without reversibility. Moderate diffusing defect  Labs ordered 01/25/2020  :    alpha one AT phenotype  MS  Level 134  LDCT November 16, 2023 showed moderate emphysema, 5.3 mm left upper lobe nodule-Lung RADS 3 (6 mon follow up )   Discussed the use of AI scribe software for clinical note transcription with the patient, who gave verbal consent to proceed.  History of Present Illness Kimberly [name] is a 78 year old  female with COPD and emphysema who presents for a three-month follow-up. She is accompanied by her husband, Vinie.  She experienced a severe dyspneic episode yesterday while exposed to cold, windy weather, describing it as terrifying and requiring assistance to return to her car. She used her home oxygen  concentrator upon returning home but did not utilize a breathing treatment during the episode.+ She was able to get back to baseline but frightened her.   She has a history of a compression fracture in her back from sneezing while two months ago.  she was prescribed Percocet for pain, which she found helpful. No longer taking, recommended to change to Tramadol . She is hesitant to take tramadol  and a muscle relaxant due to concerns about side effects and interactions with her other medications.  She is on chronic steroids, currently taking prednisone  5mg  daily.  She uses Breztri , taking two puffs twice daily, and ensures to rinse her mouth and gargle afterward. She mentions the high cost of Breztri  and her need for samples to avoid running out. Assistance paperwork filled out today.   She experienced a brief episode of coughing up green sputum about nine days ago but has not had any further episodes. No current fever or daily green sputum production.  She has a history of osteoporosis with two compression fractures in her back. She takes vitamins D and B12.  She expresses feelings of depression and frustration due to her health limitations and the inability to perform tasks she used to do. She feels guilty about her husband having to do most of the housework. She reports decreased appetite and not feeling hungry  at times.      Allergies[1]  Immunization History  Administered Date(s) Administered   Fluad Quad(high Dose 65+) 11/27/2021   Fluad Trivalent(High Dose 65+) 11/05/2022   INFLUENZA, HIGH DOSE SEASONAL PF 09/30/2018   Influenza Split 10/15/2020   Influenza,inj,Quad PF,6+ Mos  10/17/2015   Influenza,inj,quad, With Preservative 11/17/2016   Influenza-Unspecified 09/20/2013, 10/13/2017, 09/30/2018, 10/13/2019, 10/13/2019   PFIZER(Purple Top)SARS-COV-2 Vaccination 03/13/2019, 04/05/2019   Pneumococcal Conjugate-13 02/20/2010, 11/23/2012   Pneumococcal Polysaccharide-23 10/27/2017   Tdap 02/20/2009    Past Medical History:  Diagnosis Date   AICD (automatic cardioverter/defibrillator) present 03/17/2017   Anxiety    Atrial tachycardia    Basal cell carcinoma    R ant thigh, L pretibia, both txted at [facility]   Bursitis of shoulder, right, adhesive    Cancer (HCC)    CHF (congestive heart failure) (HCC)    Chronic bronchitis (HCC)    1-2 times/yr (01/23/2014)   COPD (chronic obstructive pulmonary disease) (HCC)    CVD (cerebrovascular disease)    Dyslipidemia    Dysrhythmia    Frequency of urination    GERD (gastroesophageal reflux disease)    Heart murmur    History of stomach ulcers    HTN (hypertension) 02/22/2011   Migraines    stopped many years ago (06/14/2014)   Osteoporosis 08/19/2016   Pericarditis    Pneumonia 10 times (06/14/2014)   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)    Silent myocardial infarction [facility]) late 1990's   Stress incontinence    was suppose to have been tacked up years ago but I didn't do it   Syncope, near    Associated with atrial tachycardia-event recorder 1/16   Thoracic outlet syndrome    VSD (ventricular septal defect)     Tobacco History: Tobacco Use History[2] Counseling given: Not Answered Tobacco comments: Not smoking as of 01/07/24   Outpatient Medications Prior to Visit  Medication Sig Dispense Refill   albuterol  (PROVENTIL ) (2.5 MG/3ML) 0.083% nebulizer solution Take 3 mLs (2.5 mg total) by nebulization every 6 (six) hours as needed for wheezing or shortness of breath. TAKE 3 MLS (1 VIAL) BY NEBULIZATION EVERY 6 HOURS AS NEEDED FOR WHEEZING OR  SHORTNESS OF BREATH 90 mL 5   albuterol  (VENTOLIN  HFA) 108 (90 Base) MCG/ACT inhaler INHALE 2 PUFFS INTO THE LUNGS EVERY 6 HOURS AS NEEDED FOR WHEEZING OR SHORTNESS OF BREATH 8.5 g 5   ALPRAZolam  (XANAX ) 0.5 MG tablet TAKE 1 TABLET BY MOUTH TWICE PER DAY AS NEEDED 60 tablet 5   aspirin  81 MG tablet Take 1 tablet (81 mg total) by mouth daily. 100 tablet 99   bisoprolol  (ZEBETA ) 5 MG tablet TAKE 1/2 TABLET (2.5 MG TOTAL) BY MOUTH DAILY 45 tablet 1   Budeson-Glycopyrrol-Formoterol  (BREZTRI  AEROSPHERE) 160-9-4.8 MCG/ACT AERO Inhale 2 puffs into the lungs in the morning and at bedtime. 1 each 11   digoxin  (LANOXIN ) 0.125 MG tablet Take 0.5 tablets (0.0625 mg total) by mouth daily. PLEASE SCHEDULE APPOINTMENT FOR MORE REFILLS [phone] OPTION 2 45 tablet 0   furosemide  (LASIX ) 20 MG tablet Take 2 tablets (40 mg total) by mouth 2 (two) times daily. (Patient taking differently: Take 20 mg by mouth daily.) 120 tablet 3   predniSONE  (DELTASONE ) 10 MG tablet 2 daily until breathing better then 1 x 5 days then one half daily thereafter 100 tablet 1   rosuvastatin  (CRESTOR ) 10 MG tablet Take 1 tablet (10 mg total) by mouth daily. 90 tablet 3  spironolactone  (ALDACTONE ) 25 MG tablet Take 1 tablet (25 mg total) by mouth at bedtime. 30 tablet 6   tizanidine  (ZANAFLEX ) 2 MG capsule Take 2 mg by mouth as needed for muscle spasms.     alendronate  (FOSAMAX ) 70 MG tablet Take 1 tablet (70 mg total) by mouth every 7 (seven) days. Take with a full glass of water on an empty stomach. (Patient not taking: Reported on 01/07/2024) 12 tablet 3   bisoprolol -hydrochlorothiazide (ZIAC) 2.5-6.25 MG tablet Take 1 tablet by mouth daily. (Patient not taking: Reported on 01/07/2024)     budesonide  (PULMICORT ) 0.25 MG/2ML nebulizer solution One vial twice daily thru nebulizer with albuterol  (Patient not taking: Reported on 01/07/2024) 60 mL 12   budesonide -glycopyrrolate-formoterol  (BREZTRI  AEROSPHERE) 160-9-4.8 MCG/ACT AERO inhaler  Inhale 2 puffs into the lungs in the morning and at bedtime. (Patient not taking: Reported on 01/07/2024) 11.8 g 0   budesonide -glycopyrrolate-formoterol  (BREZTRI  AEROSPHERE) 160-9-4.8 MCG/ACT AERO inhaler Inhale 2 puffs into the lungs in the morning and at bedtime. (Patient not taking: Reported on 01/07/2024) 3 each 4   oxyCODONE -acetaminophen  (PERCOCET) 7.5-325 MG tablet Take 1 tablet by mouth every 12 (twelve) hours as needed for severe pain (pain score 7-10). (Patient not taking: Reported on 01/07/2024) 10 tablet 0   sacubitril -valsartan  (ENTRESTO ) 24-26 MG Take 1 tablet by mouth 2 (two) times daily. (Patient not taking: Reported on 01/07/2024) 60 tablet 11   traMADol  (ULTRAM ) 50 MG tablet Take 50 mg by mouth every 6 (six) hours as needed. (Patient not taking: Reported on 01/07/2024)     No facility-administered medications prior to visit.     Review of Systems:   Constitutional:   No  weight loss, night sweats,  Fevers, chills,+ fatigue, or  lassitude.  HEENT:   No headaches,  Difficulty swallowing,  Tooth/dental problems, or  Sore throat,                No sneezing, itching, ear ache, nasal congestion, post nasal drip,   CV:  No chest pain,  Orthopnea, PND, swelling in lower extremities, anasarca, dizziness, palpitations, syncope.   GI  No heartburn, indigestion, abdominal pain, nausea, vomiting, diarrhea, change in bowel habits, loss of appetite, bloody stools.   Resp:   No chest wall deformity  Skin: no rash or lesions.  GU: no dysuria, change in color of urine, no urgency or frequency.  No flank pain, no hematuria   MS:  No joint pain or swelling.  No decreased range of motion.  No back pain.    Physical Exam  BP (!) 106/52   Pulse (!) 59   Temp 97.7 F (36.5 C)   Ht 5' 1 (1.549 m) Comment: Per pt  Wt 106 lb 12.8 oz (48.4 kg)   SpO2 97% Comment: RA  BMI 20.18 kg/m   GEN: A/Ox3; pleasant , NAD, frail elderly   HEENT:  [place]/AT,  EACs-clear, TMs-wnl, NOSE-clear,  THROAT-clear, no lesions, no postnasal drip or exudate noted.   NECK:  Supple w/ fair ROM; no JVD; normal carotid impulses w/o bruits; no thyromegaly or nodules palpated; no lymphadenopathy.    RESP few trace rhonchi  no accessory muscle use, no dullness to percussion  CARD:  RRR, no m/r/g, no peripheral edema, pulses intact, no cyanosis or clubbing.  GI:   Soft & nt; nml bowel sounds; no organomegaly or masses detected.   Musco: Warm bil, no deformities or joint swelling noted.   Neuro: alert, no focal deficits noted.    Skin:  Warm, no lesions or rashes    Lab Results:Reviewed 01/07/2024   CBC    Component Value Date/Time   WBC 5.9 12/07/2023 1118   RBC 3.78 (L) 12/07/2023 1118   HGB 11.7 (L) 12/07/2023 1118   HGB 13.9 10/06/2016 1418   HCT 35.0 (L) 12/07/2023 1118   HCT 42.1 10/06/2016 1418   PLT 242.0 12/07/2023 1118   PLT 273 10/06/2016 1418   MCV 92.6 12/07/2023 1118   MCV 87 10/06/2016 1418   MCH 30.4 03/01/2023 0830   MCHC 33.5 12/07/2023 1118   RDW 14.6 12/07/2023 1118   RDW 13.8 10/06/2016 1418   LYMPHSABS 1.2 12/07/2023 1118   LYMPHSABS 1.7 01/25/2013 0908   MONOABS 0.4 12/07/2023 1118   EOSABS 0.2 12/07/2023 1118   EOSABS 0.0 01/25/2013 0908   BASOSABS 0.1 12/07/2023 1118   BASOSABS 0.1 01/25/2013 0908    BMET    Component Value Date/Time   NA 139 12/07/2023 1118   NA 143 10/21/2022 1241   K 4.6 12/07/2023 1118   CL 103 12/07/2023 1118   CO2 27 12/07/2023 1118   GLUCOSE 135 (H) 12/07/2023 1118   BUN 21 12/07/2023 1118   BUN 19 10/21/2022 1241   CREATININE 1.66 (H) 12/07/2023 1118   CREATININE 0.81 11/02/2015 1251   CALCIUM  9.0 12/07/2023 1118   GFRNONAA 46 (L) 09/08/2023 1636   GFRAA >60 08/09/2019 1038    BNP    Component Value Date/Time   BNP 336.6 (H) 09/08/2023 1636    ProBNP Imaging: CUP PACEART REMOTE DEVICE CHECK Result Date: 12/18/2023 ICD Scheduled remote reviewed. Normal device function.  Presenting rhythm: AS-BIV paced,  PACs. Next remote transmission per protocol. - CS, CVRS   Administration History     None          Latest Ref Rng & Units 08/18/2022    7:59 AM 01/12/2017    9:38 AM  PFT Results  FVC-Pre L 1.36  1.46   FVC-Predicted Pre % 55  55   FVC-Post L 1.46  1.64   FVC-Predicted Post % 59  61   Pre FEV1/FVC % % 48  52   Post FEV1/FCV % % 46  54   FEV1-Pre L 0.65  0.76   FEV1-Predicted Pre % 35  37   FEV1-Post L 0.67  0.88   DLCO uncorrected ml/min/mmHg 8.86  9.68   DLCO UNC% % 51  47   DLCO corrected ml/min/mmHg 8.81    DLCO COR %Predicted % 51    DLVA Predicted % 61  78   TLC L 7.54  4.30   TLC % Predicted % 163  93   RV % Predicted % 280  134     No results found for: NITRICOXIDE      No data to display              Assessment & Plan:   Assessment and Plan Assessment & Plan Chronic obstructive pulmonary disease with emphysema  -mild exacerbation Recent exacerbation likely due to cold weather exposure. She reports difficulty breathing and back pain from a compression fracture. No recent green sputum production, indicating no current infection.  Breztri  is effective but expensive,. Increase prednisone  to 10 mg daily for one week, then reduce to 5 mg daily. Use nebulizer with albuterol  once daily for the next three days and As needed  Continue Breztri  two puffs twice daily, morning and night, with mouth rinsing after use. Use supplemental oxygen  as needed, especially if oxygen   saturation drops below 90%. Support back during breathing exercises to prevent muscle clamping.    Pulmonary nodule, left upper lobe  -new finding  A small nodule in the left upper lobe was identified on a CT scan in October. It is not currently concerning but requires monitoring. Repeat CT scan in six months to monitor the nodule part of lung cancer screening program.  Chronic respiratory failure on oxygen .  Appears stable.  Continue on oxygen  2 L with activity and at bedtime.  Continue to keep  oxygen  to saturations greater than 88 to 90%  Protein calorie malnutrition.  BMI is at 20.  Weight is stable.  Continue with high-protein diet.  Compression fracture with back pain.-Appears stable.  Continue follow-up with primary care.  Use caution with pain medications.  Osteoporosis continue follow-up with primary care.  Chronic steroid use.         Madelin Stank, NP 01/07/2024       [1]  Allergies Allergen Reactions   Mupirocin  Shortness Of Breath and Other (See Comments)    Burning, pain, swelling and sob   Codeine Nausea Only  [2]  Social History Tobacco Use  Smoking Status Former   Current packs/day: 0.50   Average packs/day: 0.5 packs/day for 35.0 years (17.5 ttl pk-yrs)   Types: Cigarettes  Smokeless Tobacco Never  Tobacco Comments   Not smoking as of 01/07/24

## 2024-01-09 ENCOUNTER — Telehealth: Payer: Self-pay

## 2024-01-09 MED ORDER — DOXYCYCLINE HYCLATE 100 MG PO TABS: 100.0000 mg | ORAL_TABLET | Freq: Two times a day (BID) | ORAL | 0 refills | Status: AC

## 2024-01-09 NOTE — Telephone Encounter (Signed)
 I called 2 times to the patient phone number however it goes to voicemail. Unable to reach out.  Marny Patch, MD

## 2024-01-09 NOTE — Telephone Encounter (Signed)
 I called patient today. She reported to have shortness of breath with exertion, some green mucus that is difficult to expectorate. I recommend the following: -doxycycline  for 7 days, 100 mg bid. -She is on prednisone  for 5 days already -use nebulizer to help to break the mucus up to 3-4 times a day. -I recommend to check her oxgen if it is <88% use her O2 at home. She reported her numbers has been 91-94%. -If she has worsening of shortness of breath, fever, more mucus production, I highly recommend her to go to the ER for evaluation. She may need IV therapy. She understand and agreed with the plan.  Marny Patch, MD

## 2024-01-10 ENCOUNTER — Inpatient Hospital Stay

## 2024-01-10 ENCOUNTER — Other Ambulatory Visit: Payer: Self-pay

## 2024-01-10 ENCOUNTER — Inpatient Hospital Stay
Admission: EM | Admit: 2024-01-10 | Discharge: 2024-01-12 | DRG: 193 | Disposition: A | Discharge status: Home / Self Care | Attending: Internal Medicine | Admitting: Internal Medicine

## 2024-01-10 ENCOUNTER — Emergency Department

## 2024-01-10 DIAGNOSIS — Z7951 Long term (current) use of inhaled steroids: Secondary | ICD-10-CM | POA: Diagnosis not present

## 2024-01-10 DIAGNOSIS — Z888 Allergy status to other drugs, medicaments and biological substances status: Secondary | ICD-10-CM | POA: Diagnosis not present

## 2024-01-10 DIAGNOSIS — I5022 Chronic systolic (congestive) heart failure: Secondary | ICD-10-CM | POA: Diagnosis present

## 2024-01-10 DIAGNOSIS — Z801 Family history of malignant neoplasm of trachea, bronchus and lung: Secondary | ICD-10-CM | POA: Diagnosis not present

## 2024-01-10 DIAGNOSIS — Z95810 Presence of automatic (implantable) cardiac defibrillator: Secondary | ICD-10-CM

## 2024-01-10 DIAGNOSIS — J440 Chronic obstructive pulmonary disease with acute lower respiratory infection: Secondary | ICD-10-CM | POA: Diagnosis present

## 2024-01-10 DIAGNOSIS — E785 Hyperlipidemia, unspecified: Secondary | ICD-10-CM | POA: Diagnosis present

## 2024-01-10 DIAGNOSIS — I110 Hypertensive heart disease with heart failure: Secondary | ICD-10-CM | POA: Diagnosis present

## 2024-01-10 DIAGNOSIS — Z9049 Acquired absence of other specified parts of digestive tract: Secondary | ICD-10-CM | POA: Diagnosis not present

## 2024-01-10 DIAGNOSIS — Z7982 Long term (current) use of aspirin: Secondary | ICD-10-CM

## 2024-01-10 DIAGNOSIS — Z808 Family history of malignant neoplasm of other organs or systems: Secondary | ICD-10-CM

## 2024-01-10 DIAGNOSIS — I252 Old myocardial infarction: Secondary | ICD-10-CM

## 2024-01-10 DIAGNOSIS — J441 Chronic obstructive pulmonary disease with (acute) exacerbation: Secondary | ICD-10-CM | POA: Diagnosis present

## 2024-01-10 DIAGNOSIS — R0602 Shortness of breath: Secondary | ICD-10-CM

## 2024-01-10 DIAGNOSIS — Z7952 Long term (current) use of systemic steroids: Secondary | ICD-10-CM

## 2024-01-10 DIAGNOSIS — Z7983 Long term (current) use of bisphosphonates: Secondary | ICD-10-CM | POA: Diagnosis not present

## 2024-01-10 DIAGNOSIS — F17210 Nicotine dependence, cigarettes, uncomplicated: Secondary | ICD-10-CM | POA: Diagnosis present

## 2024-01-10 DIAGNOSIS — R0600 Dyspnea, unspecified: Secondary | ICD-10-CM | POA: Insufficient documentation

## 2024-01-10 DIAGNOSIS — Z716 Tobacco abuse counseling: Secondary | ICD-10-CM | POA: Diagnosis not present

## 2024-01-10 DIAGNOSIS — Z823 Family history of stroke: Secondary | ICD-10-CM | POA: Diagnosis not present

## 2024-01-10 DIAGNOSIS — Z85828 Personal history of other malignant neoplasm of skin: Secondary | ICD-10-CM | POA: Diagnosis not present

## 2024-01-10 DIAGNOSIS — Z885 Allergy status to narcotic agent status: Secondary | ICD-10-CM | POA: Diagnosis not present

## 2024-01-10 DIAGNOSIS — M810 Age-related osteoporosis without current pathological fracture: Secondary | ICD-10-CM | POA: Diagnosis present

## 2024-01-10 DIAGNOSIS — J9601 Acute respiratory failure with hypoxia: Secondary | ICD-10-CM | POA: Diagnosis present

## 2024-01-10 DIAGNOSIS — Z9981 Dependence on supplemental oxygen: Secondary | ICD-10-CM

## 2024-01-10 DIAGNOSIS — I5020 Unspecified systolic (congestive) heart failure: Secondary | ICD-10-CM | POA: Diagnosis present

## 2024-01-10 DIAGNOSIS — Z8711 Personal history of peptic ulcer disease: Secondary | ICD-10-CM | POA: Diagnosis not present

## 2024-01-10 DIAGNOSIS — K219 Gastro-esophageal reflux disease without esophagitis: Secondary | ICD-10-CM | POA: Diagnosis present

## 2024-01-10 DIAGNOSIS — I10 Essential (primary) hypertension: Secondary | ICD-10-CM | POA: Diagnosis present

## 2024-01-10 DIAGNOSIS — Z8249 Family history of ischemic heart disease and other diseases of the circulatory system: Secondary | ICD-10-CM

## 2024-01-10 DIAGNOSIS — Z79899 Other long term (current) drug therapy: Secondary | ICD-10-CM | POA: Diagnosis not present

## 2024-01-10 DIAGNOSIS — J189 Pneumonia, unspecified organism: Secondary | ICD-10-CM | POA: Diagnosis present

## 2024-01-10 LAB — CBC WITH DIFFERENTIAL/PLATELET
Abs Immature Granulocytes: 0.04 K/uL (ref 0.00–0.07)
Basophils Absolute: 0 K/uL (ref 0.0–0.1)
Basophils Relative: 0 %
Eosinophils Absolute: 0.1 K/uL (ref 0.0–0.5)
Eosinophils Relative: 1 %
HCT: 31.5 % — ABNORMAL LOW (ref 36.0–46.0)
Hemoglobin: 10.1 g/dL — ABNORMAL LOW (ref 12.0–15.0)
Immature Granulocytes: 0 %
Lymphocytes Relative: 11 %
Lymphs Abs: 1.1 K/uL (ref 0.7–4.0)
MCH: 30.7 pg (ref 26.0–34.0)
MCHC: 32.1 g/dL (ref 30.0–36.0)
MCV: 95.7 fL (ref 80.0–100.0)
Monocytes Absolute: 0.8 K/uL (ref 0.1–1.0)
Monocytes Relative: 8 %
Neutro Abs: 8.7 K/uL — ABNORMAL HIGH (ref 1.7–7.7)
Neutrophils Relative %: 80 %
Platelets: 219 K/uL (ref 150–400)
RBC: 3.29 MIL/uL — ABNORMAL LOW (ref 3.87–5.11)
RDW: 13.7 % (ref 11.5–15.5)
WBC: 10.9 K/uL — ABNORMAL HIGH (ref 4.0–10.5)
nRBC: 0 % (ref 0.0–0.2)

## 2024-01-10 LAB — BASIC METABOLIC PANEL WITH GFR
Anion gap: 10 (ref 5–15)
BUN: 24 mg/dL — ABNORMAL HIGH (ref 8–23)
CO2: 28 mmol/L (ref 22–32)
Calcium: 9.2 mg/dL (ref 8.9–10.3)
Chloride: 102 mmol/L (ref 98–111)
Creatinine, Ser: 1.35 mg/dL — ABNORMAL HIGH (ref 0.44–1.00)
GFR, Estimated: 41 mL/min — ABNORMAL LOW
Glucose, Bld: 127 mg/dL — ABNORMAL HIGH (ref 70–99)
Potassium: 4.5 mmol/L (ref 3.5–5.1)
Sodium: 140 mmol/L (ref 135–145)

## 2024-01-10 LAB — CBC
HCT: 30.5 % — ABNORMAL LOW (ref 36.0–46.0)
Hemoglobin: 9.9 g/dL — ABNORMAL LOW (ref 12.0–15.0)
MCH: 30.7 pg (ref 26.0–34.0)
MCHC: 32.5 g/dL (ref 30.0–36.0)
MCV: 94.7 fL (ref 80.0–100.0)
Platelets: 229 K/uL (ref 150–400)
RBC: 3.22 MIL/uL — ABNORMAL LOW (ref 3.87–5.11)
RDW: 13.7 % (ref 11.5–15.5)
WBC: 9.6 K/uL (ref 4.0–10.5)
nRBC: 0 % (ref 0.0–0.2)

## 2024-01-10 LAB — TROPONIN T, HIGH SENSITIVITY
Troponin T High Sensitivity: 57 ng/L — ABNORMAL HIGH (ref 0–19)
Troponin T High Sensitivity: 52 ng/L — ABNORMAL HIGH (ref 0–19)

## 2024-01-10 LAB — DIGOXIN LEVEL: Digoxin Level: 0.9 ng/mL (ref 0.8–2.0)

## 2024-01-10 LAB — CREATININE, SERUM
Creatinine, Ser: 1.26 mg/dL — ABNORMAL HIGH (ref 0.44–1.00)
GFR, Estimated: 44 mL/min — ABNORMAL LOW

## 2024-01-10 LAB — STREP PNEUMONIAE URINARY ANTIGEN: Strep Pneumo Urinary Antigen: NEGATIVE

## 2024-01-10 LAB — PRO BRAIN NATRIURETIC PEPTIDE: Pro Brain Natriuretic Peptide: 5345 pg/mL — ABNORMAL HIGH

## 2024-01-10 LAB — PROCALCITONIN: Procalcitonin: 0.16 ng/mL

## 2024-01-10 LAB — MRSA NEXT GEN BY PCR, NASAL: MRSA by PCR Next Gen: NOT DETECTED

## 2024-01-10 MED ORDER — POLYETHYLENE GLYCOL 3350 17 G PO PACK: 17.0000 g | PACK | Freq: Every day | ORAL | Status: DC | PRN

## 2024-01-10 MED ORDER — ACETAMINOPHEN 325 MG PO TABS: 650.0000 mg | ORAL_TABLET | Freq: Four times a day (QID) | ORAL | Status: DC | PRN

## 2024-01-10 MED ORDER — PROCHLORPERAZINE EDISYLATE 10 MG/2ML IJ SOLN: 5.0000 mg | Freq: Four times a day (QID) | INTRAMUSCULAR | Status: DC | PRN

## 2024-01-10 MED ORDER — ASPIRIN 81 MG PO TBEC
81.0000 mg | DELAYED_RELEASE_TABLET | Freq: Every day | ORAL | Status: DC
  Administered 2024-01-10 – 2024-01-12 (×3): 81 mg via ORAL
  Filled 2024-01-10 (×3): qty 1

## 2024-01-10 MED ORDER — BISOPROLOL FUMARATE 5 MG PO TABS
5.0000 mg | ORAL_TABLET | Freq: Every day | ORAL | Status: DC
  Administered 2024-01-10 – 2024-01-12 (×3): 5 mg via ORAL
  Filled 2024-01-10 (×3): qty 1

## 2024-01-10 MED ORDER — ACETAMINOPHEN 500 MG PO TABS
500.0000 mg | ORAL_TABLET | Freq: Four times a day (QID) | ORAL | Status: DC | PRN
  Administered 2024-01-10: 500 mg via ORAL
  Filled 2024-01-10: qty 1

## 2024-01-10 MED ORDER — FENTANYL CITRATE (PF) 50 MCG/ML IJ SOSY: 12.5000 ug | PREFILLED_SYRINGE | INTRAMUSCULAR | Status: DC | PRN

## 2024-01-10 MED ORDER — METHYLPREDNISOLONE SODIUM SUCC 40 MG IJ SOLR
40.0000 mg | Freq: Two times a day (BID) | INTRAMUSCULAR | Status: DC
  Administered 2024-01-10 – 2024-01-11 (×4): 40 mg via INTRAVENOUS
  Filled 2024-01-10 (×4): qty 1

## 2024-01-10 MED ORDER — FUROSEMIDE 40 MG PO TABS
40.0000 mg | ORAL_TABLET | Freq: Two times a day (BID) | ORAL | Status: DC
  Administered 2024-01-10 – 2024-01-12 (×4): 40 mg via ORAL
  Filled 2024-01-10 (×4): qty 1

## 2024-01-10 MED ORDER — ENOXAPARIN SODIUM 30 MG/0.3ML IJ SOSY
30.0000 mg | PREFILLED_SYRINGE | INTRAMUSCULAR | Status: DC
  Filled 2024-01-10: qty 0.3

## 2024-01-10 MED ORDER — ACETAMINOPHEN 650 MG RE SUPP: 650.0000 mg | Freq: Four times a day (QID) | RECTAL | Status: DC | PRN

## 2024-01-10 MED ORDER — SODIUM CHLORIDE 0.9 % IV SOLN: 500.0000 mg | INTRAVENOUS | Status: DC

## 2024-01-10 MED ORDER — IPRATROPIUM-ALBUTEROL 0.5-2.5 (3) MG/3ML IN SOLN
3.0000 mL | Freq: Once | RESPIRATORY_TRACT | Status: AC
  Administered 2024-01-10: 3 mL via RESPIRATORY_TRACT
  Filled 2024-01-10: qty 3

## 2024-01-10 MED ORDER — IPRATROPIUM-ALBUTEROL 0.5-2.5 (3) MG/3ML IN SOLN
3.0000 mL | Freq: Once | RESPIRATORY_TRACT | Status: AC
  Administered 2024-01-10: 3 mL via RESPIRATORY_TRACT
  Filled 2024-01-10: qty 6

## 2024-01-10 MED ORDER — DIGOXIN 125 MCG PO TABS
0.0625 mg | ORAL_TABLET | Freq: Every day | ORAL | Status: DC
  Administered 2024-01-10 – 2024-01-12 (×3): 0.0625 mg via ORAL
  Filled 2024-01-10 (×3): qty 0.5

## 2024-01-10 MED ORDER — SODIUM CHLORIDE 0.9 % IV SOLN
100.0000 mg | Freq: Two times a day (BID) | INTRAVENOUS | Status: DC
  Administered 2024-01-10 – 2024-01-11 (×3): 100 mg via INTRAVENOUS
  Filled 2024-01-10 (×3): qty 100

## 2024-01-10 MED ORDER — SODIUM CHLORIDE 0.9 % IV SOLN
1.0000 g | Freq: Two times a day (BID) | INTRAVENOUS | Status: DC
  Administered 2024-01-10 – 2024-01-11 (×4): 1 g via INTRAVENOUS
  Filled 2024-01-10 (×5): qty 10

## 2024-01-10 MED ORDER — OXYCODONE HCL 5 MG PO TABS
5.0000 mg | ORAL_TABLET | ORAL | Status: DC | PRN
  Administered 2024-01-10 – 2024-01-12 (×5): 5 mg via ORAL
  Filled 2024-01-10 (×5): qty 1

## 2024-01-10 MED ORDER — MELATONIN 5 MG PO TABS
5.0000 mg | ORAL_TABLET | Freq: Every evening | ORAL | Status: DC | PRN
  Administered 2024-01-10: 5 mg via ORAL
  Filled 2024-01-10: qty 1

## 2024-01-10 MED ORDER — PREDNISONE 20 MG PO TABS
60.0000 mg | ORAL_TABLET | Freq: Once | ORAL | Status: AC
  Administered 2024-01-10: 60 mg via ORAL
  Filled 2024-01-10: qty 3

## 2024-01-10 MED ORDER — IPRATROPIUM-ALBUTEROL 0.5-2.5 (3) MG/3ML IN SOLN
3.0000 mL | Freq: Two times a day (BID) | RESPIRATORY_TRACT | Status: DC
  Administered 2024-01-11 – 2024-01-12 (×2): 3 mL via RESPIRATORY_TRACT
  Filled 2024-01-10 (×3): qty 3

## 2024-01-10 MED ORDER — FUROSEMIDE 10 MG/ML IJ SOLN
40.0000 mg | Freq: Once | INTRAMUSCULAR | Status: AC
  Administered 2024-01-10: 40 mg via INTRAVENOUS
  Filled 2024-01-10: qty 4

## 2024-01-10 MED ORDER — IPRATROPIUM-ALBUTEROL 0.5-2.5 (3) MG/3ML IN SOLN
3.0000 mL | Freq: Four times a day (QID) | RESPIRATORY_TRACT | Status: DC | PRN
  Administered 2024-01-11: 3 mL via RESPIRATORY_TRACT
  Filled 2024-01-10: qty 3

## 2024-01-10 MED ORDER — ROSUVASTATIN CALCIUM 10 MG PO TABS
10.0000 mg | ORAL_TABLET | Freq: Every day | ORAL | Status: DC
  Administered 2024-01-10 – 2024-01-12 (×3): 10 mg via ORAL
  Filled 2024-01-10 (×3): qty 1

## 2024-01-10 MED ORDER — SPIRONOLACTONE 25 MG PO TABS
25.0000 mg | ORAL_TABLET | Freq: Every day | ORAL | Status: DC
  Administered 2024-01-10 – 2024-01-12 (×3): 25 mg via ORAL
  Filled 2024-01-10 (×3): qty 1

## 2024-01-10 MED ORDER — IPRATROPIUM-ALBUTEROL 0.5-2.5 (3) MG/3ML IN SOLN
3.0000 mL | Freq: Four times a day (QID) | RESPIRATORY_TRACT | Status: DC
  Administered 2024-01-10 (×2): 3 mL via RESPIRATORY_TRACT
  Filled 2024-01-10 (×2): qty 3

## 2024-01-10 NOTE — Progress Notes (Signed)
" °   01/10/24 2000  Spiritual Encounters  Type of Visit Initial  Care provided to: Patient  Referral source Chaplain assessment  OnCall Visit Yes   Chaplain assessed patient might need support while a lot of activity in room for roommate. Chaplain provided compassionate presence and reflective listening as patient spoke of life stressors. Patient shared about her 107 year marriage and what a wonderful husband she has. Chaplain is available for follow up as needed. "

## 2024-01-10 NOTE — Progress Notes (Signed)
 Anticoagulation monitoring(Lovenox ):  78 yo female ordered Lovenox  40 mg Q24h    There were no vitals filed for this visit. BMI 20.2    Lab Results  Component Value Date   CREATININE 1.35 (H) 01/10/2024   CREATININE 1.66 (H) 12/07/2023   CREATININE 1.22 (H) 09/08/2023   Estimated Creatinine Clearance: 26.8 mL/min (A) (by C-G formula based on SCr of 1.35 mg/dL (H)). Hemoglobin & Hematocrit     Component Value Date/Time   HGB 10.1 (L) 01/10/2024 0511   HGB 13.9 10/06/2016 1418   HCT 31.5 (L) 01/10/2024 0511   HCT 42.1 10/06/2016 1418     Per Protocol for Patient with estCrcl < 30 ml/min and BMI < 30, will transition to Lovenox  30 mg Q24h.

## 2024-01-10 NOTE — H&P (Signed)
 "  History and Physical    Kimberly [name] DOB: September 17, 1945 DOA: 01/10/2024  DOS: the patient was seen and examined on 01/10/2024  PCP: [name], MD   Patient coming from: Home  I have personally briefly reviewed patient's old medical records in [facility]  Chief Complaint: Cough and shortness of breath  HPI: Kimberly [name] is a pleasant 78 y.o. female with medical history significant for COPD on home oxygen  2 L at night on chronic active intermittent smoking, HFrEF last EF was 45% to 50%, HTN, HLD, GERD who came into ED complaining of increasing shortness of breath and cough for 2 days.  Patient had a progressive shortness of breath and cough with productive greenish sputum no blood.  Patient was 90% on oxygen , breathing 20 times on arrival to ED.  Patient called primary care provider Dr. Adrien who advised patient to take doxycycline , prednisone  and nebulization.  But patient did not feel better and came to the ED for evaluation.  Patient denies any fever, chills, chest pain, palpitations, leg swelling.  ED Course: Upon arrival to the ED, patient is found to be tachypneic at 24, hypoxic at 90%, white count 10.9, hemoglobin 10.1 hematocrit 31.5, BUN 24, creatinine 1.35, proBNP was 5345, troponin 57, chest x-ray showed mild congestive changes no infiltrate.  Patient was given Lasix  40 mg x 1 nebulization, prednisone  60 mg by mouth.  Hospitalist service was consulted for evaluation for admission for COPD exacerbation.  Review of Systems:  ROS  All other systems negative except as noted in the HPI.  Past Medical History:  Diagnosis Date   AICD (automatic cardioverter/defibrillator) present 03/17/2017   Anxiety    Atrial tachycardia    Basal cell carcinoma    R ant thigh, L pretibia, both txted at [facility]   Bursitis of shoulder, right, adhesive    Cancer (HCC)    CHF (congestive heart failure) (HCC)    Chronic bronchitis (HCC)    1-2 times/yr  (01/23/2014)   COPD (chronic obstructive pulmonary disease) (HCC)    CVD (cerebrovascular disease)    Dyslipidemia    Dysrhythmia    Frequency of urination    GERD (gastroesophageal reflux disease)    Heart murmur    History of stomach ulcers    HTN (hypertension) 02/22/2011   Migraines    stopped many years ago (06/14/2014)   Osteoporosis 08/19/2016   Pericarditis    Pneumonia 10 times (06/14/2014)   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)    Silent myocardial infarction [facility]) late 1990's   Stress incontinence    was suppose to have been tacked up years ago but I didn't do it   Syncope, near    Associated with atrial tachycardia-event recorder 1/16   Thoracic outlet syndrome    VSD (ventricular septal defect)     Past Surgical History:  Procedure Laterality Date   ABDOMINAL AORTOGRAM W/LOWER EXTREMITY N/A 12/17/2016   Procedure: ABDOMINAL AORTOGRAM W/LOWER EXTREMITY;  Surgeon: Darron Deatrice LABOR, MD;  Location: [facility] INVASIVE CV LAB;  Service: Cardiovascular;  Laterality: N/A;   BIV ICD INSERTION CRT-D N/A 03/17/2017   Procedure: BIV ICD INSERTION CRT-D;  Surgeon: Waddell Danelle ORN, MD;  Location: [facility] INVASIVE CV LAB;  Service: Cardiovascular;  Laterality: N/A;   CARDIAC CATHETERIZATION  quite a few   CESAREAN SECTION  1972   CHOLECYSTECTOMY OPEN  1970's   CORONARY ANGIOGRAM  2000   No significant CAD  ELECTROPHYSIOLOGIC STUDY N/A 06/14/2014   Procedure: A-Flutter/A-Tach/SVT Ablation;  Surgeon: Danelle LELON [place], MD;  Location: [facility] INVASIVE CV LAB;  Service: Cardiovascular;  Laterality: N/A;   INSERTION OF ICD  03/17/2017   BIV   MULTIPLE TOOTH EXTRACTIONS     MYRINGOTOMY WITH TUBE PLACEMENT Right 2015   PERIPHERAL VASCULAR INTERVENTION  12/17/2016   Procedure: PERIPHERAL VASCULAR INTERVENTION;  Surgeon: Darron Deatrice LABOR, MD;  Location: [facility] INVASIVE CV LAB;  Service: Cardiovascular;;  Right common femoral PTA and Stent   RIGHT/LEFT HEART CATH  AND CORONARY ANGIOGRAPHY Bilateral 11/06/2016   Procedure: RIGHT/LEFT HEART CATH AND CORONARY ANGIOGRAPHY;  Surgeon: Darron Deatrice LABOR, MD;  Location: [facility] INVASIVE CV LAB;  Service: Cardiovascular;  Laterality: Bilateral;   SVT ABLATION  06/14/2014   TUBAL LIGATION  1972   VSD REPAIR  1958; 1967     reports that she has quit smoking. Her smoking use included cigarettes. She has a 17.5 pack-year smoking history. She has never used smokeless tobacco. She reports that she does not currently use alcohol after a past usage of about 1.0 standard drink of alcohol per week. She reports that she does not use drugs.  Allergies[1]  Family History  Problem Relation Age of Onset   Cancer Mother    Cancer Father    Throat cancer Other    Hypertension Other    Stroke Other    Alcohol abuse Other    Arthritis Other    Cancer Other        lung cancer   Hypertension Other    Arthritis Other    Stroke Other    Breast cancer Neg Hx     Prior to Admission medications  Medication Sig Start Date End Date Taking? Authorizing Provider  albuterol  (PROVENTIL ) (2.5 MG/3ML) 0.083% nebulizer solution Take 3 mLs (2.5 mg total) by nebulization every 6 (six) hours as needed for wheezing or shortness of breath. TAKE 3 MLS (1 VIAL) BY NEBULIZATION EVERY 6 HOURS AS NEEDED FOR WHEEZING OR SHORTNESS OF BREATH 11/21/22  Yes Darlean Ozell NOVAK, MD  albuterol  (VENTOLIN  HFA) 108 (90 Base) MCG/ACT inhaler INHALE 2 PUFFS INTO THE LUNGS EVERY 6 HOURS AS NEEDED FOR WHEEZING OR SHORTNESS OF BREATH 12/08/23  Yes Darlean Ozell NOVAK, MD  ALPRAZolam  (XANAX ) 0.5 MG tablet TAKE 1 TABLET BY MOUTH TWICE PER DAY AS NEEDED Patient taking differently: Take 0.5 mg by mouth at bedtime. TAKE 1 TABLET BY MOUTH TWICE PER DAY AS NEEDED 10/01/23  Yes Norleen Lynwood LELON, MD  aspirin  81 MG tablet Take 1 tablet (81 mg total) by mouth daily. 07/24/16  Yes Norleen Lynwood LELON, MD  bisoprolol  (ZEBETA ) 5 MG tablet TAKE 1/2 TABLET (2.5 MG TOTAL) BY MOUTH DAILY 11/11/23   Yes Sparrow Bush, Perkins, FNP  bisoprolol -hydrochlorothiazide (ZIAC) 2.5-6.25 MG tablet Take 1 tablet by mouth daily.   Yes [provider]  Budeson-Glycopyrrol-Formoterol  (BREZTRI  AEROSPHERE) 160-9-4.8 MCG/ACT AERO Inhale 2 puffs into the lungs in the morning and at bedtime. 12/31/22  Yes Parrett, Madelin RAMAN, NP  budesonide  (PULMICORT ) 0.25 MG/2ML nebulizer solution One vial twice daily thru nebulizer with albuterol  03/11/23  Yes Wert, Michael B, MD  budesonide -glycopyrrolate-formoterol  (BREZTRI  AEROSPHERE) 160-9-4.8 MCG/ACT AERO inhaler Inhale 2 puffs into the lungs in the morning and at bedtime. 11/02/23  Yes Hope Almarie LELON, NP  digoxin  (LANOXIN ) 0.125 MG tablet Take 0.5 tablets (0.0625 mg total) by mouth daily. PLEASE SCHEDULE APPOINTMENT FOR MORE REFILLS [phone] OPTION 2 11/25/23  Yes Poynette, Harlene HERO, FNP  doxycycline  (VIBRA -TABS) 100 MG tablet Take 1 tablet (100 mg total) by mouth 2 (two) times daily for 7 days. 01/09/24 01/16/24 Yes Adrien Winfred Berke, MD  furosemide  (LASIX ) 20 MG tablet Take 2 tablets (40 mg total) by mouth 2 (two) times daily. Patient taking differently: Take 20 mg by mouth daily. 02/03/23  Yes Lee, Jordan, NP  predniSONE  (DELTASONE ) 10 MG tablet 2 daily until breathing better then 1 x 5 days then one half daily thereafter 09/22/23  Yes Darlean Ozell NOVAK, MD  rosuvastatin  (CRESTOR ) 10 MG tablet Take 1 tablet (10 mg total) by mouth daily. 04/16/23  Yes Furth, Cadence H, PA-C  spironolactone  (ALDACTONE ) 25 MG tablet Take 1 tablet (25 mg total) by mouth at bedtime. 06/02/23  Yes [name], MD  tizanidine  (ZANAFLEX ) 2 MG capsule Take 2 mg by mouth as needed for muscle spasms.   Yes [provider]  traMADol  (ULTRAM ) 50 MG tablet Take 50 mg by mouth every 6 (six) hours as needed. 12/23/23  Yes [provider]  alendronate  (FOSAMAX ) 70 MG tablet Take 1 tablet (70 mg total) by mouth every 7 (seven) days. Take with a full glass of water on an empty  stomach. Patient not taking: Reported on 01/10/2024 07/28/23   [name], MD  oxyCODONE -acetaminophen  (PERCOCET) 7.5-325 MG tablet Take 1 tablet by mouth every 12 (twelve) hours as needed for severe pain (pain score 7-10). Patient not taking: No sig reported 11/27/23   Hilma Hastings, PA-C  sacubitril -valsartan  (ENTRESTO ) 24-26 MG Take 1 tablet by mouth 2 (two) times daily. Patient not taking: Reported on 01/10/2024 02/26/23   [name], MD    Physical Exam: Vitals:   01/10/24 0630 01/10/24 0645 01/10/24 0700 01/10/24 0715  BP: (!) 127/56  123/63   Pulse: 66 62 76 80  Resp:      Temp:      TempSrc:      SpO2: 100% 100% 99% 94%    Physical Exam   Constitutional: Alert, awake, calm, comfortable HEENT: Neck supple Respiratory: Bilateral decreased air entry at the bases, wheezing, fine basal rales present Cardiovascular: Regular rate and rhythm, no murmurs / rubs / gallops. No extremity edema. 2+ pedal pulses. No carotid bruits.  Abdomen: Soft, no tenderness, Bowel sounds positive.  Musculoskeletal: no clubbing / cyanosis. Good ROM, no contractures. Normal muscle tone.  Skin: no rashes, lesions, ulcers. Neurologic: CN 2-12 grossly intact. Sensation intact, No focal deficit identified Psychiatric: Alert and oriented x 3. Normal mood.    Labs on Admission: I have personally reviewed following labs and imaging studies  CBC: Recent Labs  Lab 01/10/24 0511  WBC 10.9*  NEUTROABS 8.7*  HGB 10.1*  HCT 31.5*  MCV 95.7  PLT 219   Basic Metabolic Panel: Recent Labs  Lab 01/10/24 0511  NA 140  K 4.5  CL 102  CO2 28  GLUCOSE 127*  BUN 24*  CREATININE 1.35*  CALCIUM  9.2   GFR: Estimated Creatinine Clearance: 26.8 mL/min (A) (by C-G formula based on SCr of 1.35 mg/dL (H)). Liver Function Tests: No results for input(s): AST, ALT, ALKPHOS, BILITOT, PROT, ALBUMIN in the last 168 hours. No results for input(s): LIPASE, AMYLASE in the last 168 hours. No  results for input(s): AMMONIA in the last 168 hours. Coagulation Profile: No results for input(s): INR, PROTIME in the last 168 hours. Cardiac Enzymes: No results for input(s): CKTOTAL, CKMB, CKMBINDEX, TROPONINI, TROPONINIHS in the last 168 hours. BNP (last 3 results) Recent Labs  02/12/23 1034 02/26/23 1440 09/08/23 1636  BNP 263.3* 604.8* 336.6*   HbA1C: No results for input(s): HGBA1C in the last 72 hours. CBG: No results for input(s): GLUCAP in the last 168 hours. Lipid Profile: No results for input(s): CHOL, HDL, LDLCALC, TRIG, CHOLHDL, LDLDIRECT in the last 72 hours. Thyroid  Function Tests: No results for input(s): TSH, T4TOTAL, FREET4, T3FREE, THYROIDAB in the last 72 hours. Anemia Panel: No results for input(s): VITAMINB12, FOLATE, FERRITIN, TIBC, IRON, RETICCTPCT in the last 72 hours. Urine analysis:    Component Value Date/Time   COLORURINE YELLOW 12/07/2023 1118   APPEARANCEUR CLEAR 12/07/2023 1118   LABSPEC 1.025 12/07/2023 1118   PHURINE 5.5 12/07/2023 1118   GLUCOSEU NEGATIVE 12/07/2023 1118   HGBUR NEGATIVE 12/07/2023 1118   BILIRUBINUR SMALL (A) 12/07/2023 1118   KETONESUR NEGATIVE 12/07/2023 1118   PROTEINUR NEGATIVE 12/08/2017 1210   UROBILINOGEN 0.2 12/07/2023 1118   NITRITE NEGATIVE 12/07/2023 1118   LEUKOCYTESUR NEGATIVE 12/07/2023 1118    Radiological Exams on Admission: I have personally reviewed images DG Chest Portable 1 View Result Date: 01/10/2024 EXAM: 1 VIEW(S) XRAY OF THE CHEST 01/10/2024 05:18:00 AM COMPARISON: AP and lateral radiographs of the chest dated 03/01/2023. CLINICAL HISTORY: SOB FINDINGS: LUNGS AND PLEURA: Prominent interstitial opacities within the lungs bilaterally, which have progressed in the interim. No pleural effusion. No pneumothorax. HEART AND MEDIASTINUM: The heart is mildly enlarged. Status post sternotomy. A cardiac pacemaker is present. BONES AND SOFT TISSUES:  Status post sternotomy. IMPRESSION: 1. Progression of prominent interstitial opacities within the lungs bilaterally. 2. Mildly enlarged heart. Status post sternotomy and cardiac pacemaker. Electronically signed by: Evalene Coho MD 01/10/2024 05:20 AM EST RP Workstation: [id number]    EKG: N/A    Assessment/Plan Active Problems:   GERD (gastroesophageal reflux disease)   HTN (hypertension)   Dyslipidemia   COPD exacerbation (HCC)   HFrEF (heart failure with reduced ejection fraction) (HCC)    Assessment and Plan: 78 year old female with history of COPD on home oxygen  during the night, HFrEF, AICD, HTN, HLD, GERD who came into ED complaining of cough and shortness of breath for a week duration.  1.  Acute hypoxemic respiratory failure/COPD exacerbation - Patient was hypoxemic at home and while arriving to the emergency room - She will be admitted to hospital as inpatient for COPD exacerbation and acute hypoxic respiratory failure - Patient received prednisone  in the emergency room. - Continue oxygen  to maintain saturation - I will start her on Solu-Medrol  40 mg twice daily, nebulization, oxygen  to maintain saturation more than 90% - Patient has cough and I believe this may be related to acute bronchitis/possible pneumonia. I will give her antibiotics ceftriaxone  and doxycycline  and also sent labs for Streptococcus and Legionella antigen   2.  CHF/HFrEF - Does not appear to be in exacerbation - Patient received IV Lasix  40 mg x 1 in the emergency room - Continue home dose of Lasix  40 mg twice daily and spironolactone  - Continue home medications, aspirin , statin and digoxin   3.  HTN/HLD - Resume her home medications bisoprolol  and rosuvastatin  - Continue to monitor blood pressure  4.  GERD - Continue Protonix   5.  Chronic active smoking - Patient is stated that she has been doing intermittent smoking -Extensive counseling was done regarding smoking cessation.    DVT  prophylaxis: Lovenox  Code Status: Full Code Family Communication: None Disposition Plan: Home Consults called: None Admission status: Inpatient, Telemetry bed   Nena Rebel, MD [facility] 01/10/2024, 9:52 AM        [  1]  Allergies Allergen Reactions   Mupirocin  Shortness Of Breath and Other (See Comments)    Burning, pain, swelling and sob   Codeine Nausea Only   "

## 2024-01-10 NOTE — ED Notes (Signed)
 Pt called out, this NT went into pt room. Pt stated that she had been calling out for the last 30 mins. This NT informed the pt that it had not been 30 mins because this NT had been in the pt's room 10-15 mins prior. Pt had asked to get up and and walk and this NT informed pt that she's been having shortness of breath and that the nurse needed her to stay in the bed for now. This NT saw the lid to the meal tray and asked what the liquid was inside and the pt stated that it was urine where pt had used the meal lid to urinate. Pt wet the bed and this NT changed the sheets. Pt was advised to try to use the purewick since pt was on lasix .

## 2024-01-10 NOTE — ED Notes (Signed)
 Pt alert, NAD, calm, interactive, resps e/u, speaking in clear complete sentences, preparing food brought in by husband, O2 2L [place] remains, VSS, some increased wob.

## 2024-01-10 NOTE — ED Notes (Signed)
 This NT went into pt room to place purewick. Pt stated that she could not use the purewick. Pt using bedpan at this time.

## 2024-01-10 NOTE — ED Triage Notes (Signed)
 Pt arrives via pov ambulatory c/o increasing SOB. Had increased work of breathing when walking to check in window. Had hx of COPD. Reports increased SOB over the past week. Developed a productive green cough 2 days ago. Is on 2 L [place] at night time. Was 90% RR mid 20s on arrival. Work of breathing improved with rest. Endorses intermittent smoking.

## 2024-01-10 NOTE — ED Provider Notes (Signed)
 "  [facility] Provider Note    Event Date/Time   First MD Initiated Contact with Patient 01/10/24 [phone]     (approximate)   History   Chief Complaint Shortness of Breath   HPI  Kimberly [name] is a 78 y.o. female with past medical history of hypertension, hyperlipidemia, COPD, dilated car myopathy, and CHF who presents to the ED complaining of shortness of breath.  Patient reports that she has had increasing difficulty breathing over the past week with cough productive of green sputum.  She denies any fevers and has not had any pain in her chest, has also not noticed any pain or swelling in her legs.  She has been using her nebulizer at home with partial relief.     Physical Exam   Triage Vital Signs: ED Triage Vitals  Encounter Vitals Group     BP 01/10/24 0457 124/79     Girls Systolic BP Percentile --      Girls Diastolic BP Percentile --      Boys Systolic BP Percentile --      Boys Diastolic BP Percentile --      Pulse Rate 01/10/24 0457 68     Resp 01/10/24 0457 (!) 24     Temp 01/10/24 0457 (!) 97.5 F (36.4 C)     Temp Source 01/10/24 0457 Oral     SpO2 01/10/24 0457 95 %     Weight --      Height --      Head Circumference --      Peak Flow --      Pain Score 01/10/24 0503 0     Pain Loc --      Pain Education --      Exclude from Growth Chart --     Most recent vital signs: Vitals:   01/10/24 0700 01/10/24 0715  BP: 123/63   Pulse: 76 80  Resp:    Temp:    SpO2: 99% 94%    Constitutional: Alert and oriented. Eyes: Conjunctivae are normal. Head: Atraumatic. Nose: No congestion/rhinnorhea. Mouth/Throat: Mucous membranes are moist.  Cardiovascular: Normal rate, regular rhythm. Grossly normal heart sounds.  2+ radial pulses bilaterally. Respiratory: Tachypneic with increased respiratory effort, poor air movement noted with faint wheezing. Gastrointestinal: Soft and nontender. No distention. Musculoskeletal: No lower extremity  tenderness nor edema.  Neurologic:  Normal speech and language. No gross focal neurologic deficits are appreciated.    ED Results / Procedures / Treatments   Labs (all labs ordered are listed, but only abnormal results are displayed) Labs Reviewed  CBC WITH DIFFERENTIAL/PLATELET - Abnormal; Notable for the following components:      Result Value   WBC 10.9 (*)    RBC 3.29 (*)    Hemoglobin 10.1 (*)    HCT 31.5 (*)    Neutro Abs 8.7 (*)    All other components within normal limits  BASIC METABOLIC PANEL WITH GFR - Abnormal; Notable for the following components:   Glucose, Bld 127 (*)    BUN 24 (*)    Creatinine, Ser 1.35 (*)    GFR, Estimated 41 (*)    All other components within normal limits  PRO BRAIN NATRIURETIC PEPTIDE - Abnormal; Notable for the following components:   Pro Brain Natriuretic Peptide 5,345.0 (*)    All other components within normal limits  TROPONIN T, HIGH SENSITIVITY - Abnormal; Notable for the following components:   Troponin T High Sensitivity 57 (*)  All other components within normal limits  TROPONIN T, HIGH SENSITIVITY     RADIOLOGY Chest x-ray reviewed and interpreted by me with no infiltrate, edema, or effusion.  PROCEDURES:  Critical Care performed: No  Procedures   MEDICATIONS ORDERED IN ED: Medications  enoxaparin  (LOVENOX ) injection 30 mg (has no administration in time range)  acetaminophen  (TYLENOL ) tablet 500 mg (has no administration in time range)  prochlorperazine  (COMPAZINE ) injection 5 mg (has no administration in time range)  polyethylene glycol (MIRALAX  / GLYCOLAX ) packet 17 g (has no administration in time range)  melatonin tablet 5 mg (has no administration in time range)  ipratropium-albuterol  (DUONEB) 0.5-2.5 (3) MG/3ML nebulizer solution 3 mL (has no administration in time range)  predniSONE  (DELTASONE ) tablet 60 mg (60 mg Oral Given 01/10/24 0546)  ipratropium-albuterol  (DUONEB) 0.5-2.5 (3) MG/3ML nebulizer  solution 3 mL (3 mLs Nebulization Given 01/10/24 0552)  furosemide  (LASIX ) injection 40 mg (40 mg Intravenous Given 01/10/24 0649)  ipratropium-albuterol  (DUONEB) 0.5-2.5 (3) MG/3ML nebulizer solution 3 mL (3 mLs Nebulization Given 01/10/24 0649)     IMPRESSION / MDM / ASSESSMENT AND PLAN / ED COURSE  I reviewed the triage vital signs and the nursing notes.                              78 y.o. female with past medical history of hypertension, hyperlipidemia, COPD, dilated car myopathy, and CHF who presents to the ED with increasing difficulty breathing and productive cough for the past week.  Patient's presentation is most consistent with acute presentation with potential threat to life or bodily function.  Differential diagnosis includes, but is not limited to, ACS, PE, pneumonia, COPD exacerbation, CHF exacerbation, anemia, electrolyte abnormality, AKI.  Patient ill-appearing and in mild respiratory distress, tachypneic but maintaining oxygen  saturations on 2 L nasal cannula.  Faint wheezing noted throughout and we will treat with prednisone  and DuoNeb.  EKG without evidence of arrhythmia or ischemia, labs with stable CKD and no significant anemia, leukocytosis, or electrolyte abnormality.  Troponin slightly above prior baseline but low suspicion for ACS or PE at this time, patient may have component of CHF given elevated BNP and interstitial infiltrates.  We will diurese with IV Lasix , case discussed with hospitalist for admission.      FINAL CLINICAL IMPRESSION(S) / ED DIAGNOSES   Final diagnoses:  COPD exacerbation (HCC)  Shortness of breath     Rx / DC Orders   ED Discharge Orders     None        Note:  This document was prepared using Dragon voice recognition software and may include unintentional dictation errors.   Willo Dunnings, MD 01/10/24 [phone]  "

## 2024-01-11 DIAGNOSIS — J441 Chronic obstructive pulmonary disease with (acute) exacerbation: Secondary | ICD-10-CM | POA: Diagnosis not present

## 2024-01-11 LAB — COMPREHENSIVE METABOLIC PANEL WITH GFR
ALT: 19 U/L (ref 0–44)
AST: 21 U/L (ref 15–41)
Albumin: 3.8 g/dL (ref 3.5–5.0)
Alkaline Phosphatase: 73 U/L (ref 38–126)
Anion gap: 14 (ref 5–15)
BUN: 30 mg/dL — ABNORMAL HIGH (ref 8–23)
CO2: 27 mmol/L (ref 22–32)
Calcium: 9 mg/dL (ref 8.9–10.3)
Chloride: 100 mmol/L (ref 98–111)
Creatinine, Ser: 1.49 mg/dL — ABNORMAL HIGH (ref 0.44–1.00)
GFR, Estimated: 36 mL/min — ABNORMAL LOW
Glucose, Bld: 148 mg/dL — ABNORMAL HIGH (ref 70–99)
Potassium: 4.8 mmol/L (ref 3.5–5.1)
Sodium: 140 mmol/L (ref 135–145)
Total Bilirubin: 0.4 mg/dL (ref 0.0–1.2)
Total Protein: 6.1 g/dL — ABNORMAL LOW (ref 6.5–8.1)

## 2024-01-11 LAB — PROTIME-INR
INR: 1.1 (ref 0.8–1.2)
Prothrombin Time: 15.2 s (ref 11.4–15.2)

## 2024-01-11 LAB — CBC
HCT: 28.9 % — ABNORMAL LOW (ref 36.0–46.0)
Hemoglobin: 9.3 g/dL — ABNORMAL LOW (ref 12.0–15.0)
MCH: 30.7 pg (ref 26.0–34.0)
MCHC: 32.2 g/dL (ref 30.0–36.0)
MCV: 95.4 fL (ref 80.0–100.0)
Platelets: 217 K/uL (ref 150–400)
RBC: 3.03 MIL/uL — ABNORMAL LOW (ref 3.87–5.11)
RDW: 13.5 % (ref 11.5–15.5)
WBC: 6.4 K/uL (ref 4.0–10.5)
nRBC: 0 % (ref 0.0–0.2)

## 2024-01-11 LAB — LEGIONELLA PNEUMOPHILA SEROGP 1 UR AG

## 2024-01-11 MED ORDER — DOXYCYCLINE HYCLATE 100 MG PO TABS
100.0000 mg | ORAL_TABLET | Freq: Two times a day (BID) | ORAL | Status: DC
  Administered 2024-01-11 – 2024-01-12 (×2): 100 mg via ORAL
  Filled 2024-01-11 (×2): qty 1

## 2024-01-11 NOTE — Progress Notes (Signed)
 " Progress Note   Patient: Kimberly [name] FMW:[id number] DOB: 02/12/1945 DOA: 01/10/2024     1 DOS: the patient was seen and examined on 01/11/2024   Brief hospital course: From HPI Kimberly [name] is a pleasant 78 y.o. female with medical history significant for COPD on home oxygen  2 L at night on chronic active intermittent smoking, HFrEF last EF was 45% to 50%, HTN, HLD, GERD who came into ED complaining of increasing shortness of breath and cough for 2 days.  Patient had a progressive shortness of breath and cough with productive greenish sputum no blood.  Patient was 90% on oxygen , breathing 20 times on arrival to ED.  Patient called primary care provider Dr. Adrien who advised patient to take doxycycline , prednisone  and nebulization.  But patient did not feel better and came to the ED for evaluation.  Patient denies any fever, chills, chest pain, palpitations, leg swelling.   ED Course: Upon arrival to the ED, patient is found to be tachypneic at 24, hypoxic at 90%, white count 10.9, hemoglobin 10.1 hematocrit 31.5, BUN 24, creatinine 1.35, proBNP was 5345, troponin 57, chest x-ray showed mild congestive changes no infiltrate.  Patient was given Lasix  40 mg x 1 nebulization, prednisone  60 mg by mouth.  Hospitalist service was consulted for evaluation for admission for COPD exacerbation.  Potation met    Assessment and Plan:   1.  Acute hypoxemic respiratory failure/COPD exacerbation - Patient was hypoxemic at home and while arriving to the emergency room - She will be admitted to hospital as inpatient for COPD exacerbation and acute hypoxic respiratory failure - Patient received prednisone  in the emergency room. - Continue oxygen  to maintain saturation - Continue steroid - Continue antibiotics Follow-up on strep and Legionella tests Continue oxygen  and wean off as tolerated     2.  CHF/HFrEF Continue Lasix  - Continue home medications, aspirin , statin and digoxin    3.  HTN/HLD -  Resume her home medications bisoprolol  and rosuvastatin  - Continue to monitor blood pressure   4.  GERD - Continue Protonix    5.  Chronic active smoking - Patient is stated that she has been doing intermittent smoking -Extensive counseling was done regarding smoking cessation.       DVT prophylaxis: Lovenox  Code Status: Full Code Family Communication: None Disposition Plan: Home Consults called: None   Subjective:  Patient seen and examined at bedside this morning Admits to improvement in respiratory function Currently on 3 L of nasal oxygen  and only uses oxygen  at night at home Denies chest pain nausea vomiting  Physical Exam:  Constitutional: Alert, awake, calm, comfortable HEENT: Neck supple Respiratory: Bilateral decreased air entry at the bases, wheezing, fine basal rales present Cardiovascular: Regular rate and rhythm, no murmurs / rubs / gallops. No extremity edema. 2+ pedal pulses. No carotid bruits.  Abdomen: Soft, no tenderness, Bowel sounds positive.  Musculoskeletal: no clubbing / cyanosis. Good ROM, no contractures. Normal muscle tone.  Skin: no rashes, lesions, ulcers. Neurologic: CN 2-12 grossly intact. Sensation intact, No focal deficit identified Psychiatric: Alert and oriented x 3. Normal mood.     Vitals:   01/10/24 2057 01/10/24 2333 01/11/24 0803 01/11/24 1251  BP:  (!) 155/62 133/67 129/64  Pulse:  60 60 (!) 57  Resp:  18 16 18   Temp:  97.6 F (36.4 C) 98.3 F (36.8 C) 99.1 F (37.3 C)  TempSrc:      SpO2: 100% 100% 100% 100%    Data Reviewed: Chest x-ray  showing bilateral opacification concerning for pneumonia    Latest Ref Rng & Units 01/11/2024    4:30 AM 01/10/2024   10:48 AM 01/10/2024    5:11 AM  CBC  WBC 4.0 - 10.5 K/uL 6.4  9.6  10.9   Hemoglobin 12.0 - 15.0 g/dL 9.3  9.9  89.8   Hematocrit 36.0 - 46.0 % 28.9  30.5  31.5   Platelets 150 - 400 K/uL 217  229  219        Latest Ref Rng & Units 01/11/2024    4:30 AM 01/10/2024    10:48 AM 01/10/2024    5:11 AM  BMP  Glucose 70 - 99 mg/dL 851   872   BUN 8 - 23 mg/dL 30   24   Creatinine 9.55 - 1.00 mg/dL 8.50  8.73  8.64   Sodium 135 - 145 mmol/L 140   140   Potassium 3.5 - 5.1 mmol/L 4.8   4.5   Chloride 98 - 111 mmol/L 100   102   CO2 22 - 32 mmol/L 27   28   Calcium  8.9 - 10.3 mg/dL 9.0   9.2       Author: Drue ONEIDA Potter, MD 01/11/2024 3:20 PM  For on call review [email or web address].  "

## 2024-01-11 NOTE — TOC CM/SW Note (Signed)
 Transition of Care [facility]) CM/SW Note    Transition of Care [facility]) - Inpatient Brief Assessment   Patient Details  Name: Kimberly [name] MRN: [id number] Date of Birth: [birth year]  Transition of Care [facility]) CM/SW Contact:    Alfonso Rummer, LCSW Phone Number: 01/11/2024, 11:23 AM   Clinical Narrative:  Completed TOC chart review No toc needs identified please contact toc should needs arise   Transition of Care Asessment: Insurance and Status: Insurance coverage has been reviewed Patient has primary care physician: Yes Kimberly [name], Kimberly W) Home environment has been reviewed: single family hom e   Prior/Current Home Services: No current home services Social Drivers of Health Review: SDOH reviewed no interventions necessary Readmission risk has been reviewed: No Transition of care needs: no transition of care needs at this time

## 2024-01-11 NOTE — Plan of Care (Signed)
" °  Problem: Activity: Goal: Ability to tolerate increased activity will improve Outcome: Progressing Goal: Will verbalize the importance of balancing activity with adequate rest periods Outcome: Progressing   Problem: Education: Goal: Knowledge of disease or condition will improve Outcome: Progressing Goal: Knowledge of the prescribed therapeutic regimen will improve Outcome: Progressing Goal: Individualized Educational Video(s) Outcome: Progressing   "

## 2024-01-11 NOTE — Progress Notes (Signed)
 PHARMACIST - PHYSICIAN COMMUNICATION DR:   Dorinda CONCERNING: Antibiotic IV to Oral Route Change Policy  RECOMMENDATION: This patient is receiving Doxycyline by the intravenous route.  Based on criteria approved by the Pharmacy and Therapeutics Committee, the antibiotic(s) is/are being converted to the equivalent oral dose form(s).   DESCRIPTION: These criteria include: Patient being treated for a respiratory tract infection, urinary tract infection, cellulitis or clostridium difficile associated diarrhea if on metronidazole The patient is not neutropenic and does not exhibit a GI malabsorption state The patient is eating (either orally or via tube) and/or has been taking other orally administered medications for a least 24 hours The patient is improving clinically and has a Tmax < 100.5   Richele Strand Rodriguez-Guzman PharmD, BCPS 01/11/2024 11:54 AM

## 2024-01-12 ENCOUNTER — Other Ambulatory Visit (HOSPITAL_COMMUNITY): Payer: Self-pay

## 2024-01-12 ENCOUNTER — Other Ambulatory Visit: Payer: Self-pay

## 2024-01-12 DIAGNOSIS — J441 Chronic obstructive pulmonary disease with (acute) exacerbation: Secondary | ICD-10-CM | POA: Diagnosis not present

## 2024-01-12 MED ORDER — AMOXICILLIN-POT CLAVULANATE 500-125 MG PO TABS
1.0000 | ORAL_TABLET | Freq: Two times a day (BID) | ORAL | 0 refills | Status: DC
  Filled 2024-01-12: qty 10, 5d supply, fill #0

## 2024-01-12 NOTE — Discharge Summary (Signed)
 " Physician Discharge Summary   Patient: Kimberly [name] MRN: [id number] DOB: April 14, 1945  Admit date:     01/10/2024  Discharge date: 01/12/2024  Discharge Physician: Drue ONEIDA Potter   PCP: [name], MD   Recommendations at discharge:  Follow-up with PCP and your cardiologist  Discharge Diagnoses: Principal Problem:   COPD exacerbation (HCC) Active Problems:   GERD (gastroesophageal reflux disease)   HTN (hypertension)   Dyslipidemia   HFrEF (heart failure with reduced ejection fraction) (HCC)  Resolved Problems:   * No resolved hospital problems. [facility] Course:  From HPI Kimberly [name] is a pleasant 78 y.o. female with medical history significant for COPD on home oxygen  2 L at night on chronic active intermittent smoking, HFrEF last EF was 45% to 50%, HTN, HLD, GERD who came into ED complaining of increasing shortness of breath and cough for 2 days.  Patient had a progressive shortness of breath and cough with productive greenish sputum no blood.  Patient was 90% on oxygen , breathing 20 times on arrival to ED.  Patient called primary care provider Dr. Adrien who advised patient to take doxycycline , prednisone  and nebulization.  But patient did not feel better and came to the ED for evaluation.  Patient denies any fever, chills, chest pain, palpitations, leg swelling.   ED Course: Upon arrival to the ED, patient is found to be tachypneic at 24, hypoxic at 90%, white count 10.9, hemoglobin 10.1 hematocrit 31.5, BUN 24, creatinine 1.35, proBNP was 5345, troponin 57, chest x-ray showed mild congestive changes no infiltrate.  Patient was given Lasix  40 mg x 1 nebulization, prednisone  60 mg by mouth.  Hospitalist service was consulted for evaluation for admission for COPD exacerbation.    The hospital course as noted below:  Assessment and Plan:    1.  Acute hypoxemic respiratory failure/COPD exacerbation - Patient was hypoxemic at home and while arriving to the emergency room -  Patient received prednisone  in the emergency room. - Continue oxygen  to maintain saturation Complete antibiotic course Oxygen  requirement improved Outpatient follow-up with PCP     2.  CHF/HFrEF Continue home medications, aspirin , statin and digoxin    3.  HTN/HLD Continue home medications bisoprolol  and rosuvastatin  - Continue to monitor blood pressure   4.  GERD - Continue Protonix    5.  Chronic active smoking - Patient is stated that she has been doing intermittent smoking -Extensive counseling was done regarding smoking cessation.  Consultants: None Procedures performed: None Disposition: Home Diet recommendation:  Cardiac diet DISCHARGE MEDICATION: Allergies as of 01/12/2024       Reactions   Mupirocin  Shortness Of Breath, Other (See Comments)   Burning, pain, swelling and sob   Codeine Nausea Only        Medication List     STOP taking these medications    bisoprolol -hydrochlorothiazide 2.5-6.25 MG tablet Commonly known as: ZIAC   oxyCODONE -acetaminophen  7.5-325 MG tablet Commonly known as: PERCOCET       TAKE these medications    albuterol  (2.5 MG/3ML) 0.083% nebulizer solution Commonly known as: PROVENTIL  Take 3 mLs (2.5 mg total) by nebulization every 6 (six) hours as needed for wheezing or shortness of breath. TAKE 3 MLS (1 VIAL) BY NEBULIZATION EVERY 6 HOURS AS NEEDED FOR WHEEZING OR SHORTNESS OF BREATH   albuterol  108 (90 Base) MCG/ACT inhaler Commonly known as: VENTOLIN  HFA INHALE 2 PUFFS INTO THE LUNGS EVERY 6 HOURS AS NEEDED FOR WHEEZING OR SHORTNESS OF BREATH   alendronate   70 MG tablet Commonly known as: FOSAMAX  Take 1 tablet (70 mg total) by mouth every 7 (seven) days. Take with a full glass of water on an empty stomach.   ALPRAZolam  0.5 MG tablet Commonly known as: XANAX  TAKE 1 TABLET BY MOUTH TWICE PER DAY AS NEEDED What changed: See the new instructions.   amoxicillin -clavulanate 500-125 MG tablet Commonly known as:  Augmentin  Take 1 tablet by mouth 2 (two) times daily for 5 days.   aspirin  81 MG tablet Take 1 tablet (81 mg total) by mouth daily.   bisoprolol  5 MG tablet Commonly known as: ZEBETA  TAKE 1/2 TABLET (2.5 MG TOTAL) BY MOUTH DAILY   Breztri  Aerosphere 160-9-4.8 MCG/ACT Aero inhaler Generic drug: budesonide -glycopyrrolate-formoterol  Inhale 2 puffs into the lungs in the morning and at bedtime.   Breztri  Aerosphere 160-9-4.8 MCG/ACT Aero inhaler Generic drug: budesonide -glycopyrrolate-formoterol  Inhale 2 puffs into the lungs in the morning and at bedtime.   budesonide  0.25 MG/2ML nebulizer solution Commonly known as: Pulmicort  One vial twice daily thru nebulizer with albuterol    digoxin  0.125 MG tablet Commonly known as: LANOXIN  Take 0.5 tablets (0.0625 mg total) by mouth daily. PLEASE SCHEDULE APPOINTMENT FOR MORE REFILLS [phone] OPTION 2   doxycycline  100 MG tablet Commonly known as: VIBRA -TABS Take 1 tablet (100 mg total) by mouth 2 (two) times daily for 7 days.   furosemide  20 MG tablet Commonly known as: LASIX  Take 2 tablets (40 mg total) by mouth 2 (two) times daily. What changed:  how much to take when to take this   predniSONE  10 MG tablet Commonly known as: DELTASONE  2 daily until breathing better then 1 x 5 days then one half daily thereafter   rosuvastatin  10 MG tablet Commonly known as: CRESTOR  Take 1 tablet (10 mg total) by mouth daily.   sacubitril -valsartan  24-26 MG Commonly known as: Entresto  Take 1 tablet by mouth 2 (two) times daily.   spironolactone  25 MG tablet Commonly known as: ALDACTONE  Take 1 tablet (25 mg total) by mouth at bedtime.   tizanidine  2 MG capsule Commonly known as: ZANAFLEX  Take 2 mg by mouth as needed for muscle spasms.   traMADol  50 MG tablet Commonly known as: ULTRAM  Take 50 mg by mouth every 6 (six) hours as needed.        Discharge Exam: Constitutional: Alert, awake, calm, comfortable HEENT: Neck  supple Respiratory: Clear to auscultation bilaterally Cardiovascular: Regular rate and rhythm Abdomen: Soft, no tenderness, Bowel sounds positive.  Musculoskeletal: no clubbing / cyanosis. Good ROM, no contractures. Normal muscle tone.  Skin: no rashes, lesions, ulcers. Neurologic: CN 2-12 grossly intact. Sensation intact, No focal deficit identified Psychiatric: Alert and oriented x 3. Normal mood.    Condition at discharge: good  The results of significant diagnostics from this hospitalization (including imaging, microbiology, ancillary and laboratory) are listed below for reference.   Imaging Studies: DG Chest Portable 1 View Result Date: 01/10/2024 EXAM: 1 VIEW(S) XRAY OF THE CHEST 01/10/2024 05:18:00 AM COMPARISON: AP and lateral radiographs of the chest dated 03/01/2023. CLINICAL HISTORY: SOB FINDINGS: LUNGS AND PLEURA: Prominent interstitial opacities within the lungs bilaterally, which have progressed in the interim. No pleural effusion. No pneumothorax. HEART AND MEDIASTINUM: The heart is mildly enlarged. Status post sternotomy. A cardiac pacemaker is present. BONES AND SOFT TISSUES: Status post sternotomy. IMPRESSION: 1. Progression of prominent interstitial opacities within the lungs bilaterally. 2. Mildly enlarged heart. Status post sternotomy and cardiac pacemaker. Electronically signed by: Evalene Coho MD 01/10/2024 05:20 AM EST RP Workstation:  [id number]   CUP PACEART REMOTE DEVICE CHECK Result Date: 12/18/2023 ICD Scheduled remote reviewed. Normal device function.  Presenting rhythm: AS-BIV paced, PACs. Next remote transmission per protocol. - CS, CVRS   Microbiology: Results for orders placed or performed during the hospital encounter of 01/10/24  MRSA Next Gen by PCR, Nasal     Status: None   Collection Time: 01/10/24  3:38 PM   Specimen: Nasal Mucosa; Nasal Swab  Result Value Ref Range Status   MRSA by PCR Next Gen NOT DETECTED NOT DETECTED Final    Comment:  (NOTE) The GeneXpert MRSA Assay (FDA approved for NASAL specimens only), is one component of a comprehensive MRSA colonization surveillance program. It is not intended to diagnose MRSA infection nor to guide or monitor treatment for MRSA infections. Test performance is not FDA approved in patients less than 25 years old. Performed at [facility], [address] [place], [place] [address]    *Note: Due to a large number of results and/or encounters for the requested time period, some results have not been displayed. A complete set of results can be found in Results Review.    Labs: CBC: Recent Labs  Lab 01/10/24 0511 01/10/24 1048 01/11/24 0430  WBC 10.9* 9.6 6.4  NEUTROABS 8.7*  --   --   HGB 10.1* 9.9* 9.3*  HCT 31.5* 30.5* 28.9*  MCV 95.7 94.7 95.4  PLT 219 229 217   Basic Metabolic Panel: Recent Labs  Lab 01/10/24 0511 01/10/24 1048 01/11/24 0430  NA 140  --  140  K 4.5  --  4.8  CL 102  --  100  CO2 28  --  27  GLUCOSE 127*  --  148*  BUN 24*  --  30*  CREATININE 1.35* 1.26* 1.49*  CALCIUM  9.2  --  9.0   Liver Function Tests: Recent Labs  Lab 01/11/24 0430  AST 21  ALT 19  ALKPHOS 73  BILITOT 0.4  PROT 6.1*  ALBUMIN 3.8   CBG: No results for input(s): GLUCAP in the last 168 hours.  Discharge time spent:  37 minutes.  Signed: Drue ONEIDA Potter, MD [facility] 01/12/2024 "

## 2024-01-13 LAB — LEGIONELLA PNEUMOPHILA SEROGP 1 UR AG: L. pneumophila Serogp 1 Ur Ag: NEGATIVE

## 2024-01-15 ENCOUNTER — Telehealth: Payer: Self-pay

## 2024-01-15 NOTE — Transitions of Care (Post Inpatient/ED Visit) (Signed)
 "  01/15/2024  Name: Kimberly [name] MRN: [id number] DOB: [birth year]  Today's TOC FU Call Status: Today's TOC FU Call Status:: Successful TOC FU Call Completed TOC FU Call Complete Date: 01/15/24  Patient's Name and Date of Birth confirmed. Name, DOB  Transition Care Management Follow-up Telephone Call Date of Discharge: 01/12/24 Discharge Facility: [facility]) Type of Discharge: Inpatient Admission Primary Inpatient Discharge Diagnosis:: COPD exacerbation How have you been since you were released from the hospital?: Better Any questions or concerns?: No  Items Reviewed: Did you receive and understand the discharge instructions provided?: Yes Medications obtained,verified, and reconciled?: Yes (Medications Reviewed) Any new allergies since your discharge?: No Dietary orders reviewed?: Yes Type of Diet Ordered:: cardiac Do you have support at home?: Yes People in Home [RPT]: spouse Name of Support/Comfort Primary Source: Kimberly [name]  Medications Reviewed Today: Medications Reviewed Today     Reviewed by Josue Falconi E, RN (Registered Nurse) on 01/15/24 at 1508  Med List Status: <None>   Medication Order Taking? Sig Documenting Provider Last Dose Status Informant  albuterol  (PROVENTIL ) (2.5 MG/3ML) 0.083% nebulizer solution [id number] Yes Take 3 mLs (2.5 mg total) by nebulization every 6 (six) hours as needed for wheezing or shortness of breath. TAKE 3 MLS (1 VIAL) BY NEBULIZATION EVERY 6 HOURS AS NEEDED FOR WHEEZING OR SHORTNESS OF BREATH Wert, Michael B, MD  Active Self, Pharmacy Records  albuterol  (VENTOLIN  HFA) [phone] Base) MCG/ACT inhaler [id number] Yes INHALE 2 PUFFS INTO THE LUNGS EVERY 6 HOURS AS NEEDED FOR WHEEZING OR SHORTNESS OF [name], MD  Active Pharmacy Records, Self  alendronate  (FOSAMAX ) 70 MG tablet [id number]  Take 1 tablet (70 mg total) by mouth every 7 (seven) days. Take with a full glass of water on an empty stomach.   Patient not taking: Reported on 01/15/2024   [name], MD  Active Pharmacy Records, Self  ALPRAZolam  (XANAX ) 0.5 MG tablet [id number] Yes TAKE 1 TABLET BY MOUTH TWICE PER DAY AS NEEDED  Patient taking differently: Take 0.5 mg by mouth at bedtime. TAKE 1 TABLET BY MOUTH TWICE PER DAY AS NEEDED   [name], MD  Active Pharmacy Records, Self  amoxicillin -clavulanate (AUGMENTIN ) 500-125 MG tablet [id number] Yes Take 1 tablet by mouth 2 (two) times daily for 5 days. Dorinda Drue DASEN, MD  Active   aspirin  81 MG tablet [id number] Yes Take 1 tablet (81 mg total) by mouth daily. [name], MD  Active Self, Pharmacy Records  bisoprolol  (ZEBETA ) 5 MG tablet [id number]  TAKE 1/2 TABLET (2.5 MG TOTAL) BY MOUTH DAILY  Patient not taking: Reported on 01/15/2024   [name], FNP  Active Pharmacy Records, Self  Budeson-Glycopyrrol-Formoterol  (BREZTRI  AEROSPHERE) 160-9-4.8 MCG/ACT TERESE [id number] Yes Inhale 2 puffs into the lungs in the morning and at bedtime. [name], NP  Active Pharmacy Records, Self  budesonide  (PULMICORT ) 0.25 MG/2ML nebulizer solution [id number] Yes One vial twice daily thru nebulizer with albuterol  Wert, Michael B, MD  Active Pharmacy Records, Self  budesonide -glycopyrrolate-formoterol  (BREZTRI  AEROSPHERE) 160-9-4.8 MCG/ACT AERO inhaler [id number]  Inhale 2 puffs into the lungs in the morning and at bedtime. [name], NP  Active Pharmacy Records, Self  digoxin  (LANOXIN ) 0.125 MG tablet [id number] Yes Take 0.5 tablets (0.0625 mg total) by mouth daily. PLEASE SCHEDULE APPOINTMENT FOR MORE REFILLS [phone] OPTION 2 [name], FNP  Active Pharmacy Records, Self  doxycycline  (VIBRA -TABS) 100 MG tablet [id number] Yes Take 1 tablet (  100 mg total) by mouth 2 (two) times daily for 7 days. Adrien Guan, Tamala, MD  Active Pharmacy Records, Self  furosemide  (LASIX ) 20 MG tablet [id number] Yes Take 2 tablets (40 mg total) by mouth 2 (two) times daily.  Patient  taking differently: Take 20 mg by mouth daily.   Lee, Jordan, NP  Active Pharmacy Records, Self  predniSONE  (DELTASONE ) 10 MG tablet [id number] Yes 2 daily until breathing better then 1 x 5 days then one half daily thereafter Wert, Michael B, MD  Active Pharmacy Records, Self  rosuvastatin  (CRESTOR ) 10 MG tablet [id number] Yes Take 1 tablet (10 mg total) by mouth daily. Franchester, Cadence H, PA-C  Active Pharmacy Records, Self  sacubitril -valsartan  (ENTRESTO ) 24-26 MG [id number]  Take 1 tablet by mouth 2 (two) times daily.  Patient not taking: Reported on 01/15/2024   [name], MD  Active Pharmacy Records, Self  spironolactone  (ALDACTONE ) 25 MG tablet [id number] Yes Take 1 tablet (25 mg total) by mouth at bedtime. [name], MD  Active Pharmacy Records, Self  tizanidine  (ZANAFLEX ) 2 MG capsule [id number] Yes Take 2 mg by mouth as needed for muscle spasms. [provider]  Active Pharmacy Records, Self  traMADol  (ULTRAM ) 50 MG tablet [id number] Yes Take 50 mg by mouth every 6 (six) hours as needed. [provider]  Active Pharmacy Records, Self            Home Care and Equipment/Supplies: Were Home Health Services Ordered?: No Any new equipment or medical supplies ordered?: No  Functional Questionnaire: Do you need assistance with bathing/showering or dressing?: No Do you need assistance with meal preparation?: No Do you need assistance with eating?: No Do you have difficulty maintaining continence: No Do you need assistance with getting out of bed/getting out of a chair/moving?: No Do you have difficulty managing or taking your medications?: No  Follow up appointments reviewed: PCP Follow-up appointment confirmed?: No (patient is scheduled with new provider on 02/12/24. She states she will call new provider office to discuss. Advised patient to call current provider office and request a hospital follow up visit.) Specialist Hospital Follow-up appointment  confirmed?: NA Do you need transportation to your follow-up appointment?: No Do you understand care options if your condition(s) worsen?: Yes-patient verbalized understanding  SDOH Interventions Today    Flowsheet Row Most Recent Value  SDOH Interventions   Food Insecurity Interventions Intervention Not Indicated  Housing Interventions Intervention Not Indicated  Transportation Interventions Intervention Not Indicated  Utilities Interventions Intervention Not Indicated   Discussed and offered 30 day TOC program.  Patient declined. The patient has been provided with contact information for the care management team and has been advised to call with any health -related questions or concerns.  The patient verbalized understanding with current plan of care.  The patient is directed to their insurance card regarding availability of benefits coverage.    Arvin Seip RN, BSN, CCM [employer], Population Health Case Manager Phone: [phone]  "

## 2024-01-15 NOTE — Patient Instructions (Signed)
 Visit Information  Thank you for taking time to visit with me today. Please don't hesitate to contact me if I can be of assistance to you.  Patient instructions: Take medications as prescribed Take antibiotics until completed Wear oxygen  as directed and monitor oxygen  level daily Maintain hydration to thin secretions and support effective coughing techniques Gradually increase activity as tolerated and as advised by provider. Include pacing and rest periods Notify provider for worsening COPD symptoms: increased shortness of breath, changes in phlegm color, fever, wheezing, chest tightness Call current provider office and request a hospital follow up visit.    Patient verbalizes understanding of instructions and care plan provided today and agrees to view in MyChart. Active MyChart status and patient understanding of how to access instructions and care plan via MyChart confirmed with patient.     The patient has been provided with contact information for the care management team and has been advised to call with any health related questions or concerns.   Please call the care guide team at [phone] if you need to cancel or reschedule your appointment.   Please call the Suicide and Crisis Lifeline: 988 call the [place]  National Suicide Prevention Lifeline: 3064871751 or TTY: 8128654583 TTY [phone]) to talk to a trained counselor call 1-800-273-TALK (toll free, 24 hour hotline) if you are experiencing a Mental Health or Behavioral Health Crisis or need someone to talk to.  Arvin Seip RN, BSN, CCM [employer], Population Health Case Manager Phone: [phone]

## 2024-01-17 ENCOUNTER — Emergency Department

## 2024-01-17 ENCOUNTER — Other Ambulatory Visit: Payer: Self-pay

## 2024-01-17 ENCOUNTER — Inpatient Hospital Stay
Admission: EM | Admit: 2024-01-17 | Discharge: 2024-01-20 | DRG: 191 | Disposition: A | Discharge status: Home / Self Care | Attending: Hospitalist | Admitting: Hospitalist

## 2024-01-17 DIAGNOSIS — F32A Depression, unspecified: Secondary | ICD-10-CM | POA: Diagnosis present

## 2024-01-17 DIAGNOSIS — Z95810 Presence of automatic (implantable) cardiac defibrillator: Secondary | ICD-10-CM | POA: Diagnosis not present

## 2024-01-17 DIAGNOSIS — I10 Essential (primary) hypertension: Secondary | ICD-10-CM | POA: Diagnosis present

## 2024-01-17 DIAGNOSIS — Z85828 Personal history of other malignant neoplasm of skin: Secondary | ICD-10-CM | POA: Diagnosis not present

## 2024-01-17 DIAGNOSIS — Z888 Allergy status to other drugs, medicaments and biological substances status: Secondary | ICD-10-CM | POA: Diagnosis not present

## 2024-01-17 DIAGNOSIS — I130 Hypertensive heart and chronic kidney disease with heart failure and stage 1 through stage 4 chronic kidney disease, or unspecified chronic kidney disease: Secondary | ICD-10-CM | POA: Diagnosis present

## 2024-01-17 DIAGNOSIS — J9611 Chronic respiratory failure with hypoxia: Secondary | ICD-10-CM | POA: Diagnosis present

## 2024-01-17 DIAGNOSIS — I252 Old myocardial infarction: Secondary | ICD-10-CM

## 2024-01-17 DIAGNOSIS — Z1152 Encounter for screening for COVID-19: Secondary | ICD-10-CM | POA: Diagnosis not present

## 2024-01-17 DIAGNOSIS — Z7983 Long term (current) use of bisphosphonates: Secondary | ICD-10-CM

## 2024-01-17 DIAGNOSIS — I480 Paroxysmal atrial fibrillation: Secondary | ICD-10-CM | POA: Diagnosis present

## 2024-01-17 DIAGNOSIS — I2510 Atherosclerotic heart disease of native coronary artery without angina pectoris: Secondary | ICD-10-CM | POA: Diagnosis present

## 2024-01-17 DIAGNOSIS — Z8249 Family history of ischemic heart disease and other diseases of the circulatory system: Secondary | ICD-10-CM | POA: Diagnosis not present

## 2024-01-17 DIAGNOSIS — Z885 Allergy status to narcotic agent status: Secondary | ICD-10-CM

## 2024-01-17 DIAGNOSIS — Z7982 Long term (current) use of aspirin: Secondary | ICD-10-CM

## 2024-01-17 DIAGNOSIS — Z7952 Long term (current) use of systemic steroids: Secondary | ICD-10-CM

## 2024-01-17 DIAGNOSIS — E785 Hyperlipidemia, unspecified: Secondary | ICD-10-CM | POA: Diagnosis present

## 2024-01-17 DIAGNOSIS — J441 Chronic obstructive pulmonary disease with (acute) exacerbation: Secondary | ICD-10-CM | POA: Diagnosis present

## 2024-01-17 DIAGNOSIS — F419 Anxiety disorder, unspecified: Secondary | ICD-10-CM | POA: Diagnosis present

## 2024-01-17 DIAGNOSIS — Z87891 Personal history of nicotine dependence: Secondary | ICD-10-CM

## 2024-01-17 DIAGNOSIS — Z9981 Dependence on supplemental oxygen: Secondary | ICD-10-CM | POA: Diagnosis not present

## 2024-01-17 DIAGNOSIS — F418 Other specified anxiety disorders: Secondary | ICD-10-CM | POA: Diagnosis present

## 2024-01-17 DIAGNOSIS — G8929 Other chronic pain: Secondary | ICD-10-CM | POA: Diagnosis present

## 2024-01-17 DIAGNOSIS — N1831 Chronic kidney disease, stage 3a: Secondary | ICD-10-CM | POA: Diagnosis present

## 2024-01-17 DIAGNOSIS — Z79899 Other long term (current) drug therapy: Secondary | ICD-10-CM

## 2024-01-17 DIAGNOSIS — Z7951 Long term (current) use of inhaled steroids: Secondary | ICD-10-CM

## 2024-01-17 DIAGNOSIS — K219 Gastro-esophageal reflux disease without esophagitis: Secondary | ICD-10-CM | POA: Diagnosis present

## 2024-01-17 DIAGNOSIS — I5022 Chronic systolic (congestive) heart failure: Secondary | ICD-10-CM | POA: Diagnosis present

## 2024-01-17 DIAGNOSIS — I5023 Acute on chronic systolic (congestive) heart failure: Secondary | ICD-10-CM | POA: Diagnosis present

## 2024-01-17 LAB — CBC WITH DIFFERENTIAL/PLATELET
Abs Immature Granulocytes: 0.03 K/uL (ref 0.00–0.07)
Basophils Absolute: 0 K/uL (ref 0.0–0.1)
Basophils Relative: 0 %
Eosinophils Absolute: 0 K/uL (ref 0.0–0.5)
Eosinophils Relative: 1 %
HCT: 33.5 % — ABNORMAL LOW (ref 36.0–46.0)
Hemoglobin: 10.9 g/dL — ABNORMAL LOW (ref 12.0–15.0)
Immature Granulocytes: 0 %
Lymphocytes Relative: 22 %
Lymphs Abs: 1.6 K/uL (ref 0.7–4.0)
MCH: 30.7 pg (ref 26.0–34.0)
MCHC: 32.5 g/dL (ref 30.0–36.0)
MCV: 94.4 fL (ref 80.0–100.0)
Monocytes Absolute: 0.6 K/uL (ref 0.1–1.0)
Monocytes Relative: 9 %
Neutro Abs: 5 K/uL (ref 1.7–7.7)
Neutrophils Relative %: 68 %
Platelets: 235 K/uL (ref 150–400)
RBC: 3.55 MIL/uL — ABNORMAL LOW (ref 3.87–5.11)
RDW: 13.3 % (ref 11.5–15.5)
WBC: 7.3 K/uL (ref 4.0–10.5)
nRBC: 0 % (ref 0.0–0.2)

## 2024-01-17 LAB — BASIC METABOLIC PANEL WITH GFR
Anion gap: 10 (ref 5–15)
BUN: 22 mg/dL (ref 8–23)
CO2: 31 mmol/L (ref 22–32)
Calcium: 9.3 mg/dL (ref 8.9–10.3)
Chloride: 100 mmol/L (ref 98–111)
Creatinine, Ser: 1.07 mg/dL — ABNORMAL HIGH (ref 0.44–1.00)
GFR, Estimated: 54 mL/min — ABNORMAL LOW
Glucose, Bld: 118 mg/dL — ABNORMAL HIGH (ref 70–99)
Potassium: 4.2 mmol/L (ref 3.5–5.1)
Sodium: 140 mmol/L (ref 135–145)

## 2024-01-17 LAB — RESP PANEL BY RT-PCR (RSV, FLU A&B, COVID)  RVPGX2
Influenza A by PCR: NEGATIVE
Influenza B by PCR: NEGATIVE
Resp Syncytial Virus by PCR: NEGATIVE
SARS Coronavirus 2 by RT PCR: NEGATIVE

## 2024-01-17 LAB — PRO BRAIN NATRIURETIC PEPTIDE: Pro Brain Natriuretic Peptide: 2744 pg/mL — ABNORMAL HIGH

## 2024-01-17 MED ORDER — ENOXAPARIN SODIUM 40 MG/0.4ML IJ SOSY: 40.0000 mg | PREFILLED_SYRINGE | INTRAMUSCULAR | Status: DC

## 2024-01-17 MED ORDER — IPRATROPIUM-ALBUTEROL 0.5-2.5 (3) MG/3ML IN SOLN
3.0000 mL | RESPIRATORY_TRACT | Status: DC
  Administered 2024-01-17 – 2024-01-18 (×6): 3 mL via RESPIRATORY_TRACT
  Filled 2024-01-17: qty 3

## 2024-01-17 MED ORDER — METHYLPREDNISOLONE SODIUM SUCC 125 MG IJ SOLR
125.0000 mg | Freq: Once | INTRAMUSCULAR | Status: AC
  Administered 2024-01-17: 125 mg via INTRAVENOUS
  Filled 2024-01-17: qty 2

## 2024-01-17 MED ORDER — ALPRAZOLAM 0.5 MG PO TABS
0.5000 mg | ORAL_TABLET | Freq: Once | ORAL | Status: AC
  Administered 2024-01-17: 0.5 mg via ORAL
  Filled 2024-01-17: qty 1

## 2024-01-17 MED ORDER — METHYLPREDNISOLONE SODIUM SUCC 40 MG IJ SOLR
40.0000 mg | Freq: Every day | INTRAMUSCULAR | Status: DC
  Administered 2024-01-18 – 2024-01-19 (×2): 40 mg via INTRAVENOUS

## 2024-01-17 MED ORDER — ALBUTEROL SULFATE (2.5 MG/3ML) 0.083% IN NEBU: 2.5000 mg | INHALATION_SOLUTION | RESPIRATORY_TRACT | Status: DC | PRN

## 2024-01-17 MED ORDER — ALBUTEROL SULFATE HFA 108 (90 BASE) MCG/ACT IN AERS: 2.0000 | INHALATION_SPRAY | RESPIRATORY_TRACT | Status: DC | PRN

## 2024-01-17 MED ORDER — ONDANSETRON HCL 4 MG/2ML IJ SOLN: 4.0000 mg | Freq: Three times a day (TID) | INTRAMUSCULAR | Status: DC | PRN

## 2024-01-17 MED ORDER — DM-GUAIFENESIN ER 30-600 MG PO TB12
1.0000 | ORAL_TABLET | Freq: Two times a day (BID) | ORAL | Status: DC | PRN
  Administered 2024-01-20: 1 via ORAL

## 2024-01-17 MED ORDER — HYDRALAZINE HCL 20 MG/ML IJ SOLN: 5.0000 mg | INTRAMUSCULAR | Status: DC | PRN

## 2024-01-17 MED ORDER — FUROSEMIDE 10 MG/ML IJ SOLN
40.0000 mg | Freq: Two times a day (BID) | INTRAMUSCULAR | Status: DC
  Administered 2024-01-18 (×2): 40 mg via INTRAVENOUS
  Filled 2024-01-17: qty 4

## 2024-01-17 MED ORDER — ACETAMINOPHEN 325 MG PO TABS: 650.0000 mg | ORAL_TABLET | Freq: Four times a day (QID) | ORAL | Status: DC | PRN

## 2024-01-17 NOTE — ED Provider Notes (Signed)
 "  [facility] Provider Note    Event Date/Time   First MD Initiated Contact with Patient 01/17/24 2051     (approximate)   History   Shortness of Breath   HPI  Kimberly [name] is a 78 y.o. female with a history of COPD on 2 L home O2 at night, HFrEF, hypertension, hyperlipidemia, GERD who presents with acute onset of shortness of breath this afternoon.  The patient states that she started to feel short of breath, then got scared and anxious, and then felt like she could not breathe at all even on her oxygen .  She was just discharged in the hospital 5 days ago.  Her family members report that they think that she convince the doctor to discharge her before she was well enough, and that she has had increasing shortness of breath cough, and weakness over the last several days.  The patient denies any chest pain.  She does report mild leg swelling.  I reviewed the past medical records.  The patient was admitted to the hospitalist service from 12/21 through 12/23 due to an acute COPD exacerbation.   Physical Exam   Triage Vital Signs: ED Triage Vitals  Encounter Vitals Group     BP 01/17/24 2053 (!) 156/69     Girls Systolic BP Percentile --      Girls Diastolic BP Percentile --      Boys Systolic BP Percentile --      Boys Diastolic BP Percentile --      Pulse Rate 01/17/24 2053 72     Resp 01/17/24 2053 (!) 26     Temp 01/17/24 2053 98.3 F (36.8 C)     Temp Source 01/17/24 2053 Oral     SpO2 01/17/24 2053 99 %     Weight 01/17/24 2055 104 lb (47.2 kg)     Height 01/17/24 2055 5' 1 (1.549 m)     Head Circumference --      Peak Flow --      Pain Score 01/17/24 2054 8     Pain Loc --      Pain Education --      Exclude from Growth Chart --     Most recent vital signs: Vitals:   01/17/24 2200 01/17/24 2300  BP: (!) 147/60 131/61  Pulse: 68 69  Resp: (!) 21 (!) 22  Temp:    SpO2: 100% 98%     General: Alert and oriented, uncomfortable appearing,  no distress.  CV:  Good peripheral perfusion.  Resp:  Increased respiratory effort.  Diminished breath sounds bilaterally. Abd:  No distention.  Other:  No significant peripheral edema.   ED Results / Procedures / Treatments   Labs (all labs ordered are listed, but only abnormal results are displayed) Labs Reviewed  BASIC METABOLIC PANEL WITH GFR - Abnormal; Notable for the following components:      Result Value   Glucose, Bld 118 (*)    Creatinine, Ser 1.07 (*)    GFR, Estimated 54 (*)    All other components within normal limits  CBC WITH DIFFERENTIAL/PLATELET - Abnormal; Notable for the following components:   RBC 3.55 (*)    Hemoglobin 10.9 (*)    HCT 33.5 (*)    All other components within normal limits  PRO BRAIN NATRIURETIC PEPTIDE - Abnormal; Notable for the following components:   Pro Brain Natriuretic Peptide 2,744.0 (*)    All other components within normal limits  RESP PANEL  BY RT-PCR (RSV, FLU A&B, COVID)  [id number]     EKG  ED ECG REPORT I, [name], the attending physician, personally viewed and interpreted this ECG.  Date: 01/17/2024 EKG Time: 2058 Rate: 71 Rhythm: Paced rhythm QRS Axis: normal Intervals: normal ST/T Wave abnormalities: normal Narrative Interpretation: no evidence of acute ischemia    RADIOLOGY  Chest x-ray: I independently viewed and interpreted the images; there are chronic interstitial markings bilaterally with no focal consolidation or edema  PROCEDURES:  Critical Care performed: No  Procedures   MEDICATIONS ORDERED IN ED: Medications  ipratropium-albuterol  (DUONEB) 0.5-2.5 (3) MG/3ML nebulizer solution 3 mL (has no administration in time range)  dextromethorphan -guaiFENesin  (MUCINEX  DM) 30-600 MG per 12 hr tablet 1 tablet (has no administration in time range)  furosemide  (LASIX ) injection 40 mg (has no administration in time range)  ondansetron  (ZOFRAN ) injection 4 mg (has no administration in time range)   hydrALAZINE  (APRESOLINE ) injection 5 mg (has no administration in time range)  acetaminophen  (TYLENOL ) tablet 650 mg (has no administration in time range)  albuterol  (PROVENTIL ) (2.5 MG/3ML) 0.083% nebulizer solution 2.5 mg (has no administration in time range)  methylPREDNISolone  sodium succinate (SOLU-MEDROL ) 40 mg/mL injection 40 mg (has no administration in time range)  ALPRAZolam  (XANAX ) tablet 0.5 mg (0.5 mg Oral Given 01/17/24 2106)  methylPREDNISolone  sodium succinate (SOLU-MEDROL ) 125 mg/2 mL injection 125 mg (125 mg Intravenous Given 01/17/24 2322)     IMPRESSION / MDM / ASSESSMENT AND PLAN / ED COURSE  I reviewed the triage vital signs and the nursing notes.  78 year old female with PMH as noted above presents with acute onset of shortness of breath this afternoon, although the family reports that she has actually been increasing short of breath for the last 3 days.  On arrival the patient is very tachypneic with increased work of breathing.  Lung sounds are diminished bilaterally although O2 saturation is in the high 90s on room air.  The patient endorses anxiety and appears anxious.  Differential diagnosis includes, but is not limited to, COPD exacerbation, bronchospasm, acute bronchitis, pneumonia, CHF exacerbation, acute anxiety.  We will give a dose of the patient's anxiety medication, obtain lab workup, chest x-ray, respiratory panel, and reassess.  Patient's presentation is most consistent with acute presentation with potential threat to life or bodily function.  The patient is on the cardiac monitor to evaluate for evidence of arrhythmia and/or significant heart rate changes.   ----------------------------------------- 11:47 PM on 01/17/2024 -----------------------------------------  Chest x-ray shows no acute findings.  CBC and BMP are unremarkable.  Respiratory panel is negative.  BNP is elevated but improved from prior.  On reassessment the patient states she still  feels somewhat short of breath although is improved.  Her family members are concerned that she has acutely worsened over the last several days.  They would feel more comfortable with admission for further monitoring and treatment, which I feel is reasonable.  I consulted Dr. Hilma from the hospitalist service; based on discussion he agrees to evaluate the patient for admission.  FINAL CLINICAL IMPRESSION(S) / ED DIAGNOSES   Final diagnoses:  COPD exacerbation (HCC)     Rx / DC Orders   ED Discharge Orders     None        Note:  This document was prepared using Dragon voice recognition software and may include unintentional dictation errors.    Cassis Waylon, MD 01/17/24 2348  "

## 2024-01-17 NOTE — ED Notes (Signed)
 CCMD called to admit patient to monitor

## 2024-01-17 NOTE — ED Triage Notes (Signed)
 Patient to ED via [facility] from home. Patient was getting in the car to come to the ED due to SOB when she had an exacerbation so she called EMS. Patient has Hx of COPD and was recently discharged from hospital due to pneumonia. Patient has completed her round of antibiotics as of today. Patient states she is still taking Lasix  and Prednisone . Patient had expiratory wheezing on EMS arrival so she received a duoneb in route. Patient wears 2L [place] at bed time and PRN but has had to wear oxygen  all day today due to her SOB.

## 2024-01-17 NOTE — H&P (Signed)
 " History and Physical    Kimberly [name] FMW:[id number] DOB: 11-25-1945 DOA: 01/17/2024  Referring MD/NP/PA:   PCP: [name], MD   Patient coming from:  The patient is coming from home.     Chief Complaint: SOB  HPI: Kimberly [name] is a 78 y.o. female with medical history significant of COPD on 2 liters oxygen  at night, sCHF with EF45-50%, HTN,  HLD, CAD, AICD, stroke, gastric ulcer, depression with anxiety, CKD-3A, PAF not on anticoagulants, pericarditis, thoracic outlet syndrome, migraine, SVT, VSD (s/p repair x 2), who presents with SOB.  Patient was recently hospitalized from 12/21 - 12/23 due to COPD exacerbation.  Patient completed course of antibiotics.  She states that she continues to have SOB, which has worsened today.  She has cough with white mucus production, no chest pain, fever or chills.  She has chest tightness.  Patient is normally using 2 L oxygen  at night, but she has been using 2 L oxygen  for the whole day. No nausea, vomiting, diarrhea or abdominal pain.  No symptoms UTI.  Patient states that she has chronic back pain.  Data reviewed independently and ED Course: pt was found to have negative PCR for COVID, flu and RSV, BNP 2744, temperature normal, blood pressure 121/61, heart rate 60-70s, RR 26, oxygen  saturation 98% on 2 L oxygen .  Patient is admitted to telemetry bed as inpatient.  Chest x-ray: 1. Stable coarsened interstitial markings. 2. Right-sided cardiac pacing device. 3. Sternotomy. 4. Atherosclerotic calcifications of the aorta.   EKG: I have personally reviewed.  Paced rhythm   Review of Systems:   General: no fevers, chills, no body weight gain, has fatigue HEENT: no blurry vision, hearing changes or sore throat Respiratory: has dyspnea, coughing, wheezing CV: no chest pain, no palpitations GI: no nausea, vomiting, abdominal pain, diarrhea, constipation GU: no dysuria, burning on urination, increased urinary frequency, hematuria  Ext: has trace  leg edema Neuro: no unilateral weakness, numbness, or tingling, no vision change or hearing loss Skin: no rash, no skin tear. MSK: No muscle spasm, no deformity, no limitation of range of movement in spin. has chronic low back pain. Heme: No easy bruising.  Travel history: No recent long distant travel.   Allergy: Allergies[1]  Past Medical History:  Diagnosis Date   AICD (automatic cardioverter/defibrillator) present 03/17/2017   Anxiety    Atrial tachycardia    Basal cell carcinoma    R ant thigh, L pretibia, both txted at [facility]   Bursitis of shoulder, right, adhesive    Cancer (HCC)    CHF (congestive heart failure) (HCC)    Chronic bronchitis (HCC)    1-2 times/yr (01/23/2014)   COPD (chronic obstructive pulmonary disease) (HCC)    CVD (cerebrovascular disease)    Dyslipidemia    Dysrhythmia    Frequency of urination    GERD (gastroesophageal reflux disease)    Heart murmur    History of stomach ulcers    HTN (hypertension) 02/22/2011   Migraines    stopped many years ago (06/14/2014)   Osteoporosis 08/19/2016   Pericarditis    Pneumonia 10 times (06/14/2014)   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)    Silent myocardial infarction [facility]) late 1990's   Stress incontinence    was suppose to have been tacked up years ago but I didn't do it   Syncope, near    Associated with atrial tachycardia-event recorder 1/16   Thoracic outlet syndrome  VSD (ventricular septal defect)     Past Surgical History:  Procedure Laterality Date   ABDOMINAL AORTOGRAM W/LOWER EXTREMITY N/A 12/17/2016   Procedure: ABDOMINAL AORTOGRAM W/LOWER EXTREMITY;  Surgeon: Darron Deatrice LABOR, MD;  Location: [facility] INVASIVE CV LAB;  Service: Cardiovascular;  Laterality: N/A;   BIV ICD INSERTION CRT-D N/A 03/17/2017   Procedure: BIV ICD INSERTION CRT-D;  Surgeon: Waddell Danelle ORN, MD;  Location: [facility] INVASIVE CV LAB;  Service: Cardiovascular;   Laterality: N/A;   CARDIAC CATHETERIZATION  quite a few   CESAREAN SECTION  1972   CHOLECYSTECTOMY OPEN  1970's   CORONARY ANGIOGRAM  2000   No significant CAD   ELECTROPHYSIOLOGIC STUDY N/A 06/14/2014   Procedure: A-Flutter/A-Tach/SVT Ablation;  Surgeon: Danelle ORN Waddell, MD;  Location: [facility] INVASIVE CV LAB;  Service: Cardiovascular;  Laterality: N/A;   INSERTION OF ICD  03/17/2017   BIV   MULTIPLE TOOTH EXTRACTIONS     MYRINGOTOMY WITH TUBE PLACEMENT Right 2015   PERIPHERAL VASCULAR INTERVENTION  12/17/2016   Procedure: PERIPHERAL VASCULAR INTERVENTION;  Surgeon: Darron Deatrice LABOR, MD;  Location: [facility] INVASIVE CV LAB;  Service: Cardiovascular;;  Right common femoral PTA and Stent   RIGHT/LEFT HEART CATH AND CORONARY ANGIOGRAPHY Bilateral 11/06/2016   Procedure: RIGHT/LEFT HEART CATH AND CORONARY ANGIOGRAPHY;  Surgeon: Darron Deatrice LABOR, MD;  Location: [facility] INVASIVE CV LAB;  Service: Cardiovascular;  Laterality: Bilateral;   SVT ABLATION  06/14/2014   TUBAL LIGATION  1972   VSD REPAIR  1958; 1967    Social History:  reports that she has quit smoking. Her smoking use included cigarettes. She has a 17.5 pack-year smoking history. She has never used smokeless tobacco. She reports that she does not currently use alcohol after a past usage of about 1.0 standard drink of alcohol per week. She reports that she does not use drugs.  Family History:  Family History  Problem Relation Age of Onset   Cancer Mother    Cancer Father    Throat cancer Other    Hypertension Other    Stroke Other    Alcohol abuse Other    Arthritis Other    Cancer Other        lung cancer   Hypertension Other    Arthritis Other    Stroke Other    Breast cancer Neg Hx      Prior to Admission medications  Medication Sig Start Date End Date Taking? Authorizing Provider  albuterol  (PROVENTIL ) (2.5 MG/3ML) 0.083% nebulizer solution Take 3 mLs (2.5 mg total) by nebulization every 6 (six) hours as needed for wheezing  or shortness of breath. TAKE 3 MLS (1 VIAL) BY NEBULIZATION EVERY 6 HOURS AS NEEDED FOR WHEEZING OR SHORTNESS OF BREATH 11/21/22   Darlean Ozell NOVAK, MD  albuterol  (VENTOLIN  HFA) 108 (90 Base) MCG/ACT inhaler INHALE 2 PUFFS INTO THE LUNGS EVERY 6 HOURS AS NEEDED FOR WHEEZING OR SHORTNESS OF BREATH 12/08/23   Darlean Ozell NOVAK, MD  alendronate  (FOSAMAX ) 70 MG tablet Take 1 tablet (70 mg total) by mouth every 7 (seven) days. Take with a full glass of water on an empty stomach. Patient not taking: Reported on 01/15/2024 07/28/23   [name], MD  ALPRAZolam  (XANAX ) 0.5 MG tablet TAKE 1 TABLET BY MOUTH TWICE PER DAY AS NEEDED Patient taking differently: Take 0.5 mg by mouth at bedtime. TAKE 1 TABLET BY MOUTH TWICE PER DAY AS NEEDED 10/01/23   [name], MD  amoxicillin -clavulanate (AUGMENTIN ) 500-125 MG tablet Take 1  tablet by mouth 2 (two) times daily for 5 days. 01/12/24 01/17/24  Dorinda Drue DASEN, MD  aspirin  81 MG tablet Take 1 tablet (81 mg total) by mouth daily. 07/24/16   [name], MD  bisoprolol  (ZEBETA ) 5 MG tablet TAKE 1/2 TABLET (2.5 MG TOTAL) BY MOUTH DAILY Patient not taking: Reported on 01/15/2024 11/11/23   [name], FNP  Budeson-Glycopyrrol-Formoterol  (BREZTRI  AEROSPHERE) 160-9-4.8 MCG/ACT AERO Inhale 2 puffs into the lungs in the morning and at bedtime. 12/31/22   Parrett, Madelin RAMAN, NP  budesonide  (PULMICORT ) 0.25 MG/2ML nebulizer solution One vial twice daily thru nebulizer with albuterol  03/11/23   Wert, Michael B, MD  budesonide -glycopyrrolate-formoterol  (BREZTRI  AEROSPHERE) 160-9-4.8 MCG/ACT AERO inhaler Inhale 2 puffs into the lungs in the morning and at bedtime. 11/02/23   [name], NP  digoxin  (LANOXIN ) 0.125 MG tablet Take 0.5 tablets (0.0625 mg total) by mouth daily. PLEASE SCHEDULE APPOINTMENT FOR MORE REFILLS [phone] OPTION 2 11/25/23   [name], FNP  furosemide  (LASIX ) 20 MG tablet Take 2 tablets (40 mg total) by mouth 2 (two) times  daily. Patient taking differently: Take 20 mg by mouth daily. 02/03/23   Lee, Jordan, NP  predniSONE  (DELTASONE ) 10 MG tablet 2 daily until breathing better then 1 x 5 days then one half daily thereafter 09/22/23   Wert, Michael B, MD  rosuvastatin  (CRESTOR ) 10 MG tablet Take 1 tablet (10 mg total) by mouth daily. 04/16/23   Furth, Cadence H, PA-C  sacubitril -valsartan  (ENTRESTO ) 24-26 MG Take 1 tablet by mouth 2 (two) times daily. Patient not taking: Reported on 01/15/2024 02/26/23   [name], MD  spironolactone  (ALDACTONE ) 25 MG tablet Take 1 tablet (25 mg total) by mouth at bedtime. 06/02/23   [name], MD  tizanidine  (ZANAFLEX ) 2 MG capsule Take 2 mg by mouth as needed for muscle spasms.    [provider]  traMADol  (ULTRAM ) 50 MG tablet Take 50 mg by mouth every 6 (six) hours as needed. 12/23/23   [provider]    Physical Exam: Vitals:   01/17/24 2053 01/17/24 2055 01/17/24 2200 01/17/24 2300  BP: (!) 156/69  (!) 147/60 131/61  Pulse: 72  68 69  Resp: (!) 26  (!) 21 (!) 22  Temp: 98.3 F (36.8 C)     TempSrc: Oral     SpO2: 99%  100% 98%  Weight:  47.2 kg    Height:  5' 1 (1.549 m)     General: Has mild acute respiratory distress HEENT:       Eyes: PERRL, EOMI, no jaundice       ENT: No discharge from the ears and nose, no pharynx injection, no tonsillar enlargement.        Neck: No JVD, no bruit, no mass felt. Heme: No neck lymph node enlargement. Cardiac: S1/S2, RRR, No murmurs, No gallops or rubs. Respiratory: Has decreased air movement bilaterally, has wheezing bilaterally GI: Soft, nondistended, nontender, no rebound pain, no organomegaly, BS present. GU: No hematuria Ext: Trace leg edema bilaterally. 1+DP/PT pulse bilaterally. Musculoskeletal: No joint deformities, No joint redness or warmth, no limitation of ROM in spin. Skin: No rashes.  Neuro: Alert, oriented X3, cranial nerves II-XII grossly intact, moves all extremities  normally. Psych: Patient is not psychotic, no suicidal or hemocidal ideation.  Labs on Admission: I have personally reviewed following labs and imaging studies  CBC: Recent Labs  Lab 01/11/24 0430 01/17/24 2101  WBC 6.4 7.3  NEUTROABS  --  5.0  HGB 9.3* 10.9*  HCT 28.9* 33.5*  MCV 95.4 94.4  PLT 217 235   Basic Metabolic Panel: Recent Labs  Lab 01/11/24 0430 01/17/24 2101  NA 140 140  K 4.8 4.2  CL 100 100  CO2 27 31  GLUCOSE 148* 118*  BUN 30* 22  CREATININE 1.49* 1.07*  CALCIUM  9.0 9.3   GFR: Estimated Creatinine Clearance: 33.3 mL/min (A) (by C-G formula based on SCr of 1.07 mg/dL (H)). Liver Function Tests: Recent Labs  Lab 01/11/24 0430  AST 21  ALT 19  ALKPHOS 73  BILITOT 0.4  PROT 6.1*  ALBUMIN 3.8   No results for input(s): LIPASE, AMYLASE in the last 168 hours. No results for input(s): AMMONIA in the last 168 hours. Coagulation Profile: Recent Labs  Lab 01/11/24 0430  INR 1.1   Cardiac Enzymes: No results for input(s): CKTOTAL, CKMB, CKMBINDEX, TROPONINI in the last 168 hours. BNP (last 3 results) Recent Labs    01/10/24 0511 01/17/24 2101  PROBNP 5,345.0* 2,744.0*   HbA1C: No results for input(s): HGBA1C in the last 72 hours. CBG: No results for input(s): GLUCAP in the last 168 hours. Lipid Profile: No results for input(s): CHOL, HDL, LDLCALC, TRIG, CHOLHDL, LDLDIRECT in the last 72 hours. Thyroid  Function Tests: No results for input(s): TSH, T4TOTAL, FREET4, T3FREE, THYROIDAB in the last 72 hours. Anemia Panel: No results for input(s): VITAMINB12, FOLATE, FERRITIN, TIBC, IRON, RETICCTPCT in the last 72 hours. Urine analysis:    Component Value Date/Time   COLORURINE YELLOW 12/07/2023 1118   APPEARANCEUR CLEAR 12/07/2023 1118   LABSPEC 1.025 12/07/2023 1118   PHURINE 5.5 12/07/2023 1118   GLUCOSEU NEGATIVE 12/07/2023 1118   HGBUR NEGATIVE 12/07/2023 1118   BILIRUBINUR SMALL  (A) 12/07/2023 1118   KETONESUR NEGATIVE 12/07/2023 1118   PROTEINUR NEGATIVE 12/08/2017 1210   UROBILINOGEN 0.2 12/07/2023 1118   NITRITE NEGATIVE 12/07/2023 1118   LEUKOCYTESUR NEGATIVE 12/07/2023 1118   Sepsis Labs: @LABRCNTIP (procalcitonin:4,lacticidven:4) ) Recent Results (from the past 240 hours)  MRSA Next Gen by PCR, Nasal     Status: None   Collection Time: 01/10/24  3:38 PM   Specimen: Nasal Mucosa; Nasal Swab  Result Value Ref Range Status   MRSA by PCR Next Gen NOT DETECTED NOT DETECTED Final    Comment: (NOTE) The GeneXpert MRSA Assay (FDA approved for NASAL specimens only), is one component of a comprehensive MRSA colonization surveillance program. It is not intended to diagnose MRSA infection nor to guide or monitor treatment for MRSA infections. Test performance is not FDA approved in patients less than 76 years old. Performed at [facility], [address] [place], [place] [address]   Resp panel by RT-PCR (RSV, Flu A&B, Covid) Anterior Nasal Swab     Status: None   Collection Time: 01/17/24  9:01 PM   Specimen: Anterior Nasal Swab  Result Value Ref Range Status   SARS Coronavirus 2 by RT PCR NEGATIVE NEGATIVE Final    Comment: (NOTE) SARS-CoV-2 target nucleic acids are NOT DETECTED.  The SARS-CoV-2 RNA is generally detectable in upper respiratory specimens during the acute phase of infection. The lowest concentration of SARS-CoV-2 viral copies this assay can detect is 138 copies/mL. A negative result does not preclude SARS-Cov-2 infection and should not be used as the sole basis for treatment or other patient management decisions. A negative result may occur with  improper specimen collection/handling, submission of specimen other than nasopharyngeal swab, presence of viral mutation(s) within the areas  targeted by this assay, and inadequate number of viral copies(<138 copies/mL). A negative result must be combined with clinical observations,  patient history, and epidemiological information. The expected result is Negative.  Fact Sheet for Patients:  bloggercourse.com  Fact Sheet for Healthcare Providers:  seriousbroker.it  This test is no t yet approved or cleared by the [place]  FDA and  has been authorized for detection and/or diagnosis of SARS-CoV-2 by FDA under an Emergency Use Authorization (EUA). This EUA will remain  in effect (meaning this test can be used) for the duration of the COVID-19 declaration under Section 564(b)(1) of the Act, 21 U.S.C.section 360bbb-3(b)(1), unless the authorization is terminated  or revoked sooner.       Influenza A by PCR NEGATIVE NEGATIVE Final   Influenza B by PCR NEGATIVE NEGATIVE Final    Comment: (NOTE) The Xpert Xpress SARS-CoV-2/FLU/RSV plus assay is intended as an aid in the diagnosis of influenza from Nasopharyngeal swab specimens and should not be used as a sole basis for treatment. Nasal washings and aspirates are unacceptable for Xpert Xpress SARS-CoV-2/FLU/RSV testing.  Fact Sheet for Patients: bloggercourse.com  Fact Sheet for Healthcare Providers: seriousbroker.it  This test is not yet approved or cleared by the [place]  FDA and has been authorized for detection and/or diagnosis of SARS-CoV-2 by FDA under an Emergency Use Authorization (EUA). This EUA will remain in effect (meaning this test can be used) for the duration of the COVID-19 declaration under Section 564(b)(1) of the Act, 21 U.S.C. section 360bbb-3(b)(1), unless the authorization is terminated or revoked.     Resp Syncytial Virus by PCR NEGATIVE NEGATIVE Final    Comment: (NOTE) Fact Sheet for Patients: bloggercourse.com  Fact Sheet for Healthcare Providers: seriousbroker.it  This test is not yet approved or cleared by the [place]  [place] FDA and has been authorized for detection and/or diagnosis of SARS-CoV-2 by FDA under an Emergency Use Authorization (EUA). This EUA will remain in effect (meaning this test can be used) for the duration of the COVID-19 declaration under Section 564(b)(1) of the Act, 21 U.S.C. section 360bbb-3(b)(1), unless the authorization is terminated or revoked.  Performed at [facility], [address] [place], [place] [address]      Radiological Exams on Admission:   Assessment/Plan Principal Problem:   COPD exacerbation (HCC) Active Problems:   Chronic systolic CHF (congestive heart failure) (HCC)   CAD (coronary artery disease)   HTN (hypertension)   Dyslipidemia   PAF (paroxysmal atrial fibrillation) (HCC)   Chronic kidney disease, stage 3a (HCC)   Depression with anxiety   Assessment and Plan:  COPD exacerbation [facility]): Patient has decreased air movement bilaterally with wheezing bilaterally, indicating he still has COPD exacerbation.  No infiltrate on chest x-ray.  Has negative PCR for COVID and flu and RSV.  -will admit patient to telemetry bed  -Bronchodilators and prn Mucinex  -Solu-Medrol  80 mg IV daily after given 125 mg of Solu-Medrol  -Incentive spirometry -Follow up sputum culture  Chronic systolic CHF (congestive heart failure) (HCC): 2D echo on 12/03/2023 showed EF of 45-50%.  Patient has trace leg edema, but has elevated BNP 2744, indicating mild fluid overload. - Started IV Lasix  40 mg twice daily - Continue Entresto  and spironolactone   CAD (coronary artery disease): No chest pain -Aspirin , Crestor   HTN (hypertension) -IV hydralazine  as needed - On Entresto , spironolactone , IV Lasix   Dyslipidemia -Crestor   PAF (paroxysmal atrial fibrillation) (HCC): heart rate 60-70s.  Patient is not taking bisoprolol  currently. -Telemetry monitoring -  Continue digoxin  0.0625 mg daily - Check digoxin  level  Chronic kidney disease, stage 3a [facility]): Renal  function stable.  Recent baseline creatinine 1.1-1.3.  Her creatinine is 1.07, BUN 22, GFR 54. -Follow-up with BMP  Depression with anxiety - Continue home as needed Xanax        DVT ppx: SQ Lovenox   Code Status: Full code  Family Communication:    Yes, patient's husband and son    at bed side.   Disposition Plan:  Anticipate discharge back to previous environment  Consults called:  none  Admission status and Level of care: Telemetry: as inpt        Dispo: The patient is from: Home              Anticipated d/c is to: Home              Anticipated d/c date is: 2 days              Patient currently is not medically stable to d/c.    Severity of Illness:  The appropriate patient status for this patient is INPATIENT. Inpatient status is judged to be reasonable and necessary in order to provide the required intensity of service to ensure the patient's safety. The patient's presenting symptoms, physical exam findings, and initial radiographic and laboratory data in the context of their chronic comorbidities is felt to place them at high risk for further clinical deterioration. Furthermore, it is not anticipated that the patient will be medically stable for discharge from the hospital within 2 midnights of admission.   * I certify that at the point of admission it is my clinical judgment that the patient will require inpatient hospital care spanning beyond 2 midnights from the point of admission due to high intensity of service, high risk for further deterioration and high frequency of surveillance required.*       Date of Service 01/18/2024    Caleb Exon [facility]   If 7PM-7AM, please contact night-coverage [email or web address] 01/18/2024, 12:43 AM     [1]  Allergies Allergen Reactions   Mupirocin  Shortness Of Breath and Other (See Comments)    Burning, pain, swelling and sob   Codeine Nausea Only   "

## 2024-01-18 ENCOUNTER — Encounter: Payer: Self-pay | Admitting: Internal Medicine

## 2024-01-18 DIAGNOSIS — J441 Chronic obstructive pulmonary disease with (acute) exacerbation: Secondary | ICD-10-CM | POA: Diagnosis not present

## 2024-01-18 LAB — RESPIRATORY PANEL BY PCR

## 2024-01-18 LAB — BASIC METABOLIC PANEL WITH GFR
Anion gap: 14 (ref 5–15)
BUN: 31 mg/dL — ABNORMAL HIGH (ref 8–23)
CO2: 30 mmol/L (ref 22–32)
Calcium: 9.9 mg/dL (ref 8.9–10.3)
Chloride: 95 mmol/L — ABNORMAL LOW (ref 98–111)
Creatinine, Ser: 1.28 mg/dL — ABNORMAL HIGH (ref 0.44–1.00)
GFR, Estimated: 43 mL/min — ABNORMAL LOW
Glucose, Bld: 251 mg/dL — ABNORMAL HIGH (ref 70–99)
Potassium: 4 mmol/L (ref 3.5–5.1)
Sodium: 140 mmol/L (ref 135–145)

## 2024-01-18 LAB — EXPECTORATED SPUTUM ASSESSMENT W GRAM STAIN, RFLX TO RESP C

## 2024-01-18 LAB — CBC
HCT: 36.1 % (ref 36.0–46.0)
Hemoglobin: 11.8 g/dL — ABNORMAL LOW (ref 12.0–15.0)
MCH: 30.3 pg (ref 26.0–34.0)
MCHC: 32.7 g/dL (ref 30.0–36.0)
MCV: 92.6 fL (ref 80.0–100.0)
Platelets: 260 K/uL (ref 150–400)
RBC: 3.9 MIL/uL (ref 3.87–5.11)
RDW: 13.6 % (ref 11.5–15.5)
WBC: 10.8 K/uL — ABNORMAL HIGH (ref 4.0–10.5)
nRBC: 0 % (ref 0.0–0.2)

## 2024-01-18 LAB — DIGOXIN LEVEL: Digoxin Level: 0.9 ng/mL (ref 0.8–2.0)

## 2024-01-18 LAB — MAGNESIUM: Magnesium: 2.1 mg/dL (ref 1.7–2.4)

## 2024-01-18 MED ORDER — SPIRONOLACTONE 25 MG PO TABS
25.0000 mg | ORAL_TABLET | Freq: Every day | ORAL | Status: DC
  Administered 2024-01-18 – 2024-01-20 (×3): 25 mg via ORAL
  Filled 2024-01-18 (×3): qty 1

## 2024-01-18 MED ORDER — OXYCODONE-ACETAMINOPHEN 5-325 MG PO TABS
1.0000 | ORAL_TABLET | ORAL | Status: DC | PRN
  Administered 2024-01-18 – 2024-01-20 (×4): 1 via ORAL
  Filled 2024-01-18 (×4): qty 1

## 2024-01-18 MED ORDER — ASPIRIN 81 MG PO TBEC
81.0000 mg | DELAYED_RELEASE_TABLET | Freq: Every day | ORAL | Status: DC
  Administered 2024-01-18 – 2024-01-20 (×3): 81 mg via ORAL
  Filled 2024-01-18 (×3): qty 1

## 2024-01-18 MED ORDER — TIZANIDINE HCL 2 MG PO TABS: 2.0000 mg | ORAL_TABLET | ORAL | Status: DC | PRN

## 2024-01-18 MED ORDER — DIGOXIN 125 MCG PO TABS
0.0625 mg | ORAL_TABLET | Freq: Every day | ORAL | Status: DC
  Administered 2024-01-18 – 2024-01-20 (×3): 0.0625 mg via ORAL
  Filled 2024-01-18 (×3): qty 0.5

## 2024-01-18 MED ORDER — SACUBITRIL-VALSARTAN 24-26 MG PO TABS
1.0000 | ORAL_TABLET | Freq: Two times a day (BID) | ORAL | Status: DC
  Administered 2024-01-18 – 2024-01-20 (×5): 1 via ORAL
  Filled 2024-01-18 (×6): qty 1

## 2024-01-18 MED ORDER — ROSUVASTATIN CALCIUM 10 MG PO TABS
10.0000 mg | ORAL_TABLET | Freq: Every day | ORAL | Status: DC
  Administered 2024-01-18 – 2024-01-20 (×3): 10 mg via ORAL
  Filled 2024-01-18 (×3): qty 1

## 2024-01-18 MED ORDER — FUROSEMIDE 10 MG/ML IJ SOLN
40.0000 mg | Freq: Two times a day (BID) | INTRAMUSCULAR | Status: DC
  Administered 2024-01-18 – 2024-01-19 (×2): 40 mg via INTRAVENOUS
  Filled 2024-01-18 (×2): qty 4

## 2024-01-18 MED ORDER — ALPRAZOLAM 0.5 MG PO TABS
0.5000 mg | ORAL_TABLET | Freq: Every day | ORAL | Status: DC
  Administered 2024-01-18 – 2024-01-19 (×2): 0.5 mg via ORAL
  Filled 2024-01-18 (×2): qty 1

## 2024-01-18 NOTE — TOC Initial Note (Signed)
 Transition of Care [facility]) - Initial/Assessment Note    Patient Details  Name: Kimberly [name] MRN: [id number] Date of Birth: 1945/09/10  Transition of Care [facility]) CM/SW Contact:    Victory Jackquline RAMAN, RN Phone Number: 01/18/2024, 11:15 AM  Clinical Narrative:                 Readmission prevention screen complete. RNCM met with patient and her husband at bedside. RNCM introduced myself and my role and explained that discharge planning would be discussed. Patient has a PCP and states that she doesn't have any issues getting medications. She lives at home with her husband and is totally independent at home and usually drives herself to her doctors appointments unless its somewhere far then her husband rides with her. She uses oxygen  @ 2LPM via [place] at night at home and uses [employer] for her oxygen  supplies. Wants to get better and go home. States she completed the entire course of ABT's from last admission and was still very SOB yesterday. No TOC needs have been identified at this time. RNCM will continue to follow for discharge planning needs.         Patient Goals and CMS Choice            Expected Discharge Plan and Services                                              Prior Living Arrangements/Services                       Activities of Daily Living      Permission Sought/Granted                  Emotional Assessment              Admission diagnosis:  COPD exacerbation (HCC) [J44.1] Acute on chronic systolic CHF (congestive heart failure) (HCC) [I50.23] Patient Active Problem List   Diagnosis Date Noted   CAD (coronary artery disease) 01/17/2024   Depression with anxiety 01/17/2024   Chronic kidney disease, stage 3a (HCC) 01/17/2024   PAF (paroxysmal atrial fibrillation) (HCC) 01/17/2024   Dyspnea 01/10/2024   HFrEF (heart failure with reduced ejection fraction) (HCC) 01/10/2024   Chronic respiratory failure with hypoxia (HCC) 03/11/2023    Wedge compression fracture of T7 vertebra with routine healing 03/08/2023   Pulmonary nodule 03/08/2023   COPD with acute exacerbation (HCC) 12/21/2022   Community acquired pneumonia 12/19/2022   Shingles outbreak 03/06/2022   Fever 10/31/2021   COPD (chronic obstructive pulmonary disease) (HCC) 10/31/2021   Bradycardia 05/29/2021   Acute hypoxemic respiratory failure (HCC) 05/18/2021   Respiratory infection 05/18/2021   Hepatic cirrhosis (HCC) 05/18/2021   Vitamin D  deficiency 06/12/2020   Post-COVID-19 condition 06/11/2020   B12 deficiency 06/11/2020   Urinary frequency 06/11/2020   Weight loss 06/11/2020   ICD (implantable cardioverter-defibrillator) in place 02/24/2019   Hyperglycemia 12/28/2018   Left rotator cuff tear arthropathy 11/15/2018   AC joint arthropathy 11/15/2018   Orthostatic syncope    Abscess of right leg 09/24/2016   Left leg cellulitis 08/26/2016   Chronic systolic CHF (congestive heart failure) (HCC) 08/26/2016   Osteoporosis 08/19/2016   Degenerative arthritis of right knee 08/18/2016   Easy bruising 07/24/2016   Right knee pain 07/24/2016   Varicose veins of both lower extremities  07/24/2016   Laceration of right lower leg 07/24/2016   Multifocal pneumonia 05/06/2016   Constipation 04/10/2016   Allergic rhinitis 03/22/2016   Depression 01/03/2016   Acute sinus infection 06/28/2014   SVT (supraventricular tachycardia) 06/14/2014   Syncope and collapse 01/23/2014   VT (ventricular tachycardia) reported on Holter Monitor 01/23/2014   Hearing loss, right 06/29/2013   COPD, severe (HCC) 09/13/2012   Dilated cardiomyopathy (HCC) 02/11/2012   Volume overload 02/11/2012   Migraine 02/26/2011   Chronic neck pain 02/26/2011   Cough 02/26/2011   Encounter for well adult exam with abnormal findings 02/22/2011   Anxiety 02/22/2011   GERD (gastroesophageal reflux disease) 02/22/2011   Pericarditis 02/22/2011   Thoracic outlet syndrome 02/22/2011   HTN  (hypertension) 02/22/2011   Fatigue 05/31/2009   CHEST PAIN UNSPECIFIED 05/31/2009   Dyslipidemia 11/13/2008   Former cigarette smoker 11/13/2008   PVD 11/13/2008   COPD exacerbation (HCC) 11/13/2008   VSD- s/p repair x 2 11/13/2008   PCP:  [name], MD Pharmacy:   [facility] - [place], [place] - [address] [place] [address] Phone: [phone] Fax: [phone]  [id number] - [place], [place] - [address] [place] [address] Phone: [phone] Fax: [phone]     Social Drivers of Health (SDOH) Social History: SDOH Screenings   Food Insecurity: No Food Insecurity (01/15/2024)  Housing: Unknown (01/15/2024)  Transportation Needs: No Transportation Needs (01/15/2024)  Utilities: Not At Risk (01/15/2024)  Alcohol Screen: Low Risk (12/02/2021)  Depression (PHQ2-9): Low Risk (01/15/2024)  Financial Resource Strain: Medium Risk (06/18/2023)  Physical Activity: Inactive (06/18/2023)  Social Connections: Socially Integrated (01/11/2024)  Stress: Stress Concern Present (06/18/2023)  Tobacco Use: Medium Risk (01/07/2024)   SDOH Interventions:     Readmission Risk Interventions     No data to display

## 2024-01-18 NOTE — Evaluation (Signed)
 Occupational Therapy Evaluation Patient Details Name: Kimberly [name] MRN: [id number] DOB: 04-Jul-1945 Today's Date: 01/18/2024   History of Present Illness   Kimberly [name] is a 78 y.o. female with medical history significant of COPD on 2 liters oxygen  at night, sCHF with EF45-50%, HTN,  HLD, CAD, AICD, stroke, gastric ulcer, depression with anxiety, CKD-3A, PAF not on anticoagulants, pericarditis, thoracic outlet syndrome, migraine, SVT, VSD (s/p repair x 2), who presents with SOB.   Clinical Impressions Kimberly [name] was seen for OT evaluation this date. Prior to hospital admission, pt was IND. Pt lives with spouse. Pt currently requires SUPERVISION + RW for ADL t/f ~30 ft, SBA for ADL t/f ~30 ft no AD use. SpO2 90% on RA with activity. Educated on ECS and falls prevention. Pt would benefit from skilled OT to address noted impairments and functional limitations (see below for any additional details). Upon hospital discharge, recommend no OT follow up, may benefit from cardiopulmonary rehab.     If plan is discharge home, recommend the following:   A little help with walking and/or transfers;Help with stairs or ramp for entrance;Assistance with cooking/housework     Functional Status Assessment   Patient has had a recent decline in their functional status and demonstrates the ability to make significant improvements in function in a reasonable and predictable amount of time.     Equipment Recommendations   None recommended by OT     Recommendations for Other Services         Precautions/Restrictions   Precautions Precautions: None Recall of Precautions/Restrictions: Intact Restrictions Weight Bearing Restrictions Per Provider Order: No     Mobility Bed Mobility Overal bed mobility: Modified Independent                  Transfers Overall transfer level: Needs assistance Equipment used: None Transfers: Sit to/from Stand Sit to Stand: Supervision                   Balance Overall balance assessment: Needs assistance Sitting-balance support: No upper extremity supported, Feet supported Sitting balance-Leahy Scale: Normal     Standing balance support: No upper extremity supported, During functional activity Standing balance-Leahy Scale: Good                             ADL either performed or assessed with clinical judgement   ADL Overall ADL's : Needs assistance/impaired                                       General ADL Comments: SUPERVISION + RW for ADL t/f ~30 ft, SBA for ADL t/f ~30 ft no AD use.      Pertinent Vitals/Pain Pain Assessment Pain Assessment: No/denies pain     Extremity/Trunk Assessment Upper Extremity Assessment Upper Extremity Assessment: Overall WFL for tasks assessed   Lower Extremity Assessment Lower Extremity Assessment: Generalized weakness       Communication Communication Communication: No apparent difficulties   Cognition Arousal: Alert Behavior During Therapy: WFL for tasks assessed/performed Cognition: No apparent impairments                               Following commands: Intact       Cueing  General Comments      SpO2 90% on RA  with activity   Exercises     Shoulder Instructions      Home Living Family/patient expects to be discharged to:: Private residence Living Arrangements: Spouse/significant other Available Help at Discharge: Family Type of Home: House Home Access: Stairs to enter Secretary/administrator of Steps: 3 Entrance Stairs-Rails: None Home Layout: One level               Home Equipment: Cane - single Librarian, Academic (2 wheels)          Prior Functioning/Environment Prior Level of Function : Independent/Modified Independent;Driving                    OT Problem List: Decreased activity tolerance;Cardiopulmonary status limiting activity   OT Treatment/Interventions: Self-care/ADL  training;Therapeutic exercise;Energy conservation;DME and/or AE instruction;Therapeutic activities;Patient/family education      OT Goals(Current goals can be found in the care plan section)   Acute Rehab OT Goals Patient Stated Goal: to go home OT Goal Formulation: With patient Time For Goal Achievement: 02/01/24 Potential to Achieve Goals: Good ADL Goals Pt Will Perform Grooming: Independently;standing Pt Will Perform Lower Body Dressing: Independently;sit to/from stand Pt Will Transfer to Toilet: Independently;ambulating;regular height toilet   OT Frequency:  Min 2X/week    Co-evaluation              AM-PAC OT 6 Clicks Daily Activity     Outcome Measure Help from another person eating meals?: None Help from another person taking care of personal grooming?: None Help from another person toileting, which includes using toliet, bedpan, or urinal?: None Help from another person bathing (including washing, rinsing, drying)?: A Little Help from another person to put on and taking off regular upper body clothing?: None Help from another person to put on and taking off regular lower body clothing?: None 6 Click Score: 23   End of Session    Activity Tolerance: Patient tolerated treatment well Patient left: in bed;with call bell/phone within reach  OT Visit Diagnosis: Other abnormalities of gait and mobility (R26.89);Muscle weakness (generalized) (M62.81)                Time: 8699-8681 OT Time Calculation (min): 18 min Charges:  OT General Charges $OT Visit: 1 Visit OT Evaluation $OT Eval Low Complexity: 1 Low  Elston Slot, M.S. OTR/L  01/18/2024, 1:42 PM  ascom [phone]

## 2024-01-18 NOTE — ED Notes (Signed)
 MD [place] notified that pt refused scheduled Lovenox 

## 2024-01-18 NOTE — Progress Notes (Signed)
" °  PROGRESS NOTE    Kimberly [name]  FMW:[id number] DOB: [birth year] DOA: 01/17/2024 PCP: [name], MD  [id number]  LOS: 1 day   Brief hospital course:   Assessment & Plan: Kimberly [name] is a 78 y.o. female with medical history significant of COPD on 2 liters oxygen  at night, sCHF with EF45-50%, HTN,  HLD, CAD, AICD, stroke, gastric ulcer, depression with anxiety, CKD-3A, PAF not on anticoagulants, pericarditis, thoracic outlet syndrome, migraine, SVT, VSD (s/p repair x 2), who presents with SOB.   Patient was recently hospitalized from 12/21 - 12/23 due to COPD exacerbation.  Patient completed course of antibiotics and was on prednisone  taper.   COPD exacerbation [facility]):  Patient has decreased air movement bilaterally.  Wheezing resolved by next day. No infiltrate on chest x-ray.  Has negative PCR for COVID and flu and RSV. --received IV solumedrol 125 mg on presentation. --cont IV solumedrol 40 mg daily --cont schedule DuoNeb  Chronic hypoxemic respiratory failure On 2L O2 nightly --currently no increased O2 requirement  Chronic systolic CHF (congestive heart failure) (HCC):  2D echo on 12/03/2023 showed EF of 45-50%.  Patient has trace leg edema, but has elevated BNP 2744, indicating mild fluid overload.  Started IV Lasix  40 mg twice daily --cont IV lasix  40 mg BID for now - Continue Entresto  and spironolactone    CAD (coronary artery disease):  No chest pain -Aspirin , Crestor    HTN (hypertension) -IV hydralazine  as needed - On Entresto , spironolactone , IV Lasix    Dyslipidemia -Crestor    PAF (paroxysmal atrial fibrillation) (HCC):  heart rate 60-70s.  Patient is not taking bisoprolol  currently. - Continue digoxin  0.0625 mg daily   Chronic kidney disease, stage 3a [facility]):  Renal function stable.  Recent baseline creatinine 1.1-1.3.   --monitor while diuresing   Depression with anxiety --cont home Xanax    DVT prophylaxis: Lovenox  SQ Code Status: Full code   Family Communication: daughter updated at bedside today Level of care: Telemetry Dispo:   The patient is from: home Anticipated d/c is to: home Anticipated d/c date is: 2-3 days   Subjective and Interval History:  Pt reported dyspnea improved.   Objective: Vitals:   01/18/24 1600 01/18/24 1649 01/18/24 1725 01/18/24 1842  BP: (!) 125/57 (!) 125/57  (!) 105/55  Pulse: 87 (!) 108 76 86  Resp: 18 (!) 26 (!) 21 19  Temp:  98.2 F (36.8 C)  97.9 F (36.6 C)  TempSrc:  Oral  Oral  SpO2: 96% 94% 94% 97%  Weight:      Height:       No intake or output data in the 24 hours ending 01/18/24 1903 Filed Weights   01/17/24 2055  Weight: 47.2 kg    Examination:   Constitutional: NAD, AAOx3 HEENT: conjunctivae and lids normal, EOMI CV: No cyanosis.   RESP: normal respiratory effort, no wheezing, reduced lung sounds, on RA Neuro: II - XII grossly intact.   Psych: Normal mood and affect.  Appropriate judgement and reason   Data Reviewed: I have personally reviewed labs and imaging studies  Time spent: 50 minutes  Ellouise Haber, MD [facility] If 7PM-7AM, please contact night-coverage 01/18/2024, 7:03 PM   "

## 2024-01-18 NOTE — Progress Notes (Signed)
 Heart Failure Navigator Progress Note  Current CHF Clinic patient of Ezra Shuck, MD. Patient hospitalized on 01/17/24 @ [facility]. Hospital follow-up scheduled for 02/04/24 @ 2:00 PM.  Appointments details entered on patient's  AVS.    Navigator available for reassessment of patient.   Charmaine Pines, RN, BSN [facility] Heart Failure Navigator Secure Chat Only

## 2024-01-19 ENCOUNTER — Other Ambulatory Visit (HOSPITAL_COMMUNITY): Payer: Self-pay

## 2024-01-19 ENCOUNTER — Telehealth (HOSPITAL_COMMUNITY): Payer: Self-pay

## 2024-01-19 ENCOUNTER — Other Ambulatory Visit: Payer: Self-pay | Admitting: Internal Medicine

## 2024-01-19 DIAGNOSIS — J441 Chronic obstructive pulmonary disease with (acute) exacerbation: Secondary | ICD-10-CM

## 2024-01-19 LAB — GLUCOSE, CAPILLARY: Glucose-Capillary: 196 mg/dL — ABNORMAL HIGH (ref 70–99)

## 2024-01-19 MED ORDER — ENOXAPARIN SODIUM 30 MG/0.3ML IJ SOSY
30.0000 mg | PREFILLED_SYRINGE | INTRAMUSCULAR | Status: DC
  Filled 2024-01-19 (×2): qty 0.3

## 2024-01-19 MED ORDER — IPRATROPIUM-ALBUTEROL 0.5-2.5 (3) MG/3ML IN SOLN
3.0000 mL | Freq: Three times a day (TID) | RESPIRATORY_TRACT | Status: DC
  Administered 2024-01-19 – 2024-01-20 (×3): 3 mL via RESPIRATORY_TRACT
  Filled 2024-01-19 (×3): qty 3

## 2024-01-19 MED ORDER — PREDNISONE 20 MG PO TABS
40.0000 mg | ORAL_TABLET | Freq: Every day | ORAL | Status: DC
  Administered 2024-01-20: 40 mg via ORAL
  Filled 2024-01-19: qty 2

## 2024-01-19 NOTE — Plan of Care (Signed)

## 2024-01-19 NOTE — Telephone Encounter (Signed)
 Pharmacy Patient Advocate Encounter  Insurance verification completed.    The patient is insured through [facility]. Patient has Medicare and is not eligible for a copay card, but may be able to apply for patient assistance or Medicare RX Payment Plan (Patient Must reach out to their plan, if eligible for payment plan), if available.    Ran test claim for Jardiance 10mg  tablet and the current 30 day co-pay is $47.  Ran test claim for Farxiga  10mg  tablet and the current 30 day co-pay is $47.  This test claim was processed through [employer]- copay amounts may vary at other pharmacies due to [employer], or as the patient moves through the different stages of their insurance plan.

## 2024-01-19 NOTE — Care Management Important Message (Signed)
 Important Message  Patient Details  Name: Kimberly [name] MRN: [id number] Date of Birth: [birth year]   Important Message Given:  Yes - Medicare IM     [name] 01/19/2024, 2:06 PM

## 2024-01-19 NOTE — Evaluation (Signed)
 Physical Therapy Evaluation Patient Details Name: Kimberly [name] MRN: [id number] DOB: 1945/03/21 Today's Date: 01/19/2024  History of Present Illness  Kimberly [name] is a 78 y.o. female with medical history significant of COPD on 2 liters oxygen  at night, sCHF with EF45-50%, HTN,  HLD, CAD, AICD, stroke, gastric ulcer, depression with anxiety, CKD-3A, PAF not on anticoagulants, pericarditis, thoracic outlet syndrome, migraine, SVT, VSD (s/p repair x 2), who presents with SOB.  MD assessment includes COPD exacerbation.   Clinical Impression  Pt was pleasant and motivated to participate during the session and put forth good effort throughout. Pt required no physical assistance with below functional tasks and was generally steady with all standing activities without an AD.  Pt requested to mobilize on room air with SpO2 found to be mid 90s at rest on RA and dropped to a low of 91% while ambulating.  Pt left on RA at end of session with SpO2 96%, nursing notified.            If plan is discharge home, recommend the following: Assist for transportation;Help with stairs or ramp for entrance;A little help with walking and/or transfers   Can travel by private vehicle        Equipment Recommendations None recommended by PT  Recommendations for Other Services       Functional Status Assessment Patient has had a recent decline in their functional status and demonstrates the ability to make significant improvements in function in a reasonable and predictable amount of time.     Precautions / Restrictions Precautions Precautions: None Restrictions Weight Bearing Restrictions Per Provider Order: No      Mobility  Bed Mobility Overal bed mobility: Needs Assistance Bed Mobility: Rolling, Sidelying to Sit, Sit to Sidelying Rolling: Supervision Sidelying to sit: Supervision     Sit to sidelying: Supervision General bed mobility comments: log roll training to address back pain with pt  reporting history of vertebral compression fractures    Transfers Overall transfer level: Needs assistance Equipment used: None Transfers: Sit to/from Stand Sit to Stand: Supervision           General transfer comment: Good eccentric and concentric control from various height surfaces    Ambulation/Gait Ambulation/Gait assistance: Supervision Gait Distance (Feet): 50 Feet Assistive device: None Gait Pattern/deviations: Step-through pattern, Decreased step length - right, Decreased step length - left Gait velocity: decreased     General Gait Details: Min reduced cadence but steady without overt LOB including during start/stops and head turns  Acupuncturist Bed    Modified Rankin (Stroke Patients Only)       Balance Overall balance assessment: Needs assistance   Sitting balance-Leahy Scale: Normal     Standing balance support: No upper extremity supported, During functional activity Standing balance-Leahy Scale: Good                               Pertinent Vitals/Pain Pain Assessment Pain Assessment: 0-10 Pain Score: 8  Pain Location: back Pain Descriptors / Indicators: Sore Pain Intervention(s): Repositioned, Premedicated before session, Monitored during session    Home Living Family/patient expects to be discharged to:: Private residence Living Arrangements: Spouse/significant other Available Help at Discharge: Family Type of Home: House   Entrance Stairs-Rails: None Entrance Stairs-Number of Steps: 3   Home Layout: One level Home Equipment: [employer] -  single point;Rolling Walker (2 wheels)      Prior Function Prior Level of Function : Independent/Modified Independent;Driving             Mobility Comments: Ind amb without an AD, no fall history, 2LO2/min [place] at night ADLs Comments: Ind with ADLs     Extremity/Trunk Assessment   Upper Extremity Assessment Upper Extremity Assessment: Overall  WFL for tasks assessed    Lower Extremity Assessment Lower Extremity Assessment: Overall WFL for tasks assessed       Communication   Communication Communication: No apparent difficulties    Cognition Arousal: Alert Behavior During Therapy: WFL for tasks assessed/performed   PT - Cognitive impairments: No apparent impairments                         Following commands: Intact       Cueing Cueing Techniques: Verbal cues, Tactile cues     General Comments      Exercises Other Exercises Other Exercises: Log roll training   Assessment/Plan    PT Assessment Patient needs continued PT services  PT Problem List Decreased activity tolerance       PT Treatment Interventions DME instruction;Gait training;Stair training;Functional mobility training;Therapeutic activities;Therapeutic exercise;Balance training;Patient/family education    PT Goals (Current goals can be found in the Care Plan section)  Acute Rehab PT Goals Patient Stated Goal: Improved endurance/activity tolerance PT Goal Formulation: With patient Time For Goal Achievement: 02/01/24 Potential to Achieve Goals: Good    Frequency Min 2X/week     Co-evaluation               AM-PAC PT 6 Clicks Mobility  Outcome Measure Help needed turning from your back to your side while in a flat bed without using bedrails?: None Help needed moving from lying on your back to sitting on the side of a flat bed without using bedrails?: None Help needed moving to and from a bed to a chair (including a wheelchair)?: A Little Help needed standing up from a chair using your arms (e.g., wheelchair or bedside chair)?: A Little Help needed to walk in hospital room?: A Little Help needed climbing 3-5 steps with a railing? : A Little 6 Click Score: 20    End of Session Equipment Utilized During Treatment: Gait belt Activity Tolerance: Patient tolerated treatment well Patient left: in chair;with call bell/phone  within reach;with family/visitor present Nurse Communication: Mobility status;Other (comment) (SpO2 with amb on room air per above, pt left on RA at end of session) PT Visit Diagnosis: Difficulty in walking, not elsewhere classified (R26.2)    Time: 9090-9064 PT Time Calculation (min) (ACUTE ONLY): 26 min   Charges:   PT Evaluation $PT Eval Moderate Complexity: 1 Mod PT Treatments $Therapeutic Activity: 8-22 mins PT General Charges $$ ACUTE PT VISIT: 1 Visit    D. Glendia Bertin PT, DPT 01/19/2024, 10:38 AM

## 2024-01-19 NOTE — Progress Notes (Signed)
" °  PROGRESS NOTE    Kimberly [name]  FMW:[id number] DOB: 1945/07/01 DOA: 01/17/2024 PCP: [name], MD  [id number]/[id number]  LOS: 2 days   Brief hospital course:   Assessment & Plan: Kimberly [name] is a 78 y.o. female with medical history significant of COPD on 2 liters oxygen  at night, sCHF with EF45-50%, HTN,  HLD, CAD, AICD, stroke, gastric ulcer, depression with anxiety, CKD-3A, PAF not on anticoagulants, pericarditis, thoracic outlet syndrome, migraine, SVT, VSD (s/p repair x 2), who presents with SOB.   Patient was recently hospitalized from 12/21 - 12/23 due to COPD exacerbation.  Patient completed course of antibiotics and was on prednisone  taper.   COPD exacerbation [facility]):  Patient has decreased air movement bilaterally.  Wheezing resolved by next day. No infiltrate on chest x-ray.  Has negative PCR for COVID and flu and RSV. --received IV solumedrol 125 mg on presentation, then 40 mg daily --transition to prednisone  40 mg daily tomorrow --cont schedule DuoNeb  Chronic hypoxemic respiratory failure On 2L O2 nightly --currently no increased O2 requirement  Chronic systolic CHF (congestive heart failure) (HCC):  2D echo on 12/03/2023 showed EF of 45-50%.  Patient has trace leg edema, but has elevated BNP 2744, indicating mild fluid overload.  Started IV Lasix  40 mg twice daily --d/c IV lasix  - Continue Entresto  and spironolactone    CAD (coronary artery disease):  No chest pain -Aspirin , Crestor    HTN (hypertension) --cont Entresto  and aldactone    Dyslipidemia -Crestor    PAF (paroxysmal atrial fibrillation) (HCC):  heart rate 60-70s.  Patient is not taking bisoprolol  currently. - Continue digoxin  0.0625 mg daily   Chronic kidney disease, stage 3a [facility]):  Renal function stable.  Recent baseline creatinine 1.1-1.3.     Depression with anxiety --cont home Xanax    DVT prophylaxis: Lovenox  SQ Code Status: Full code  Family Communication:  Level of care:  Med-Surg Dispo:   The patient is from: home Anticipated d/c is to: home Anticipated d/c date is: 1-2 days   Subjective and Interval History:  Pt reported DOE, and back pain.   Objective: Vitals:   01/19/24 1259 01/19/24 1606 01/19/24 1657 01/19/24 1937  BP: 108/70 (!) 86/48 (!) 113/43 102/67  Pulse: 77 85 75 90  Resp: 17 16 19 16   Temp: 98.6 F (37 C) 98.3 F (36.8 C) 98.5 F (36.9 C) 98.4 F (36.9 C)  TempSrc:    Oral  SpO2: 99% 97% 96% 97%  Weight:      Height:        Intake/Output Summary (Last 24 hours) at 01/19/2024 2009 Last data filed at 01/19/2024 1200 Gross per 24 hour  Intake 480 ml  Output 1000 ml  Net -520 ml   Filed Weights   01/17/24 2055 01/19/24 0500  Weight: 47.2 kg 44.6 kg    Examination:   Constitutional: NAD, AAOx3 HEENT: conjunctivae and lids normal, EOMI CV: No cyanosis.   RESP: normal respiratory effort, on RA Neuro: II - XII grossly intact.   Psych: Normal mood and affect.  Appropriate judgement and reason   Data Reviewed: I have personally reviewed labs and imaging studies  Time spent: 35 minutes  Ellouise Haber, MD [facility] If 7PM-7AM, please contact night-coverage 01/19/2024, 8:09 PM   "

## 2024-01-19 NOTE — Progress Notes (Signed)
 Occupational Therapy Treatment Patient Details Name: Kimberly [name] MRN: [id number] DOB: [birth year] Today's Date: 01/19/2024   History of present illness Kimberly [name] is a 78 y.o. female with medical history significant of COPD on 2 liters oxygen  at night, sCHF with EF45-50%, HTN,  HLD, CAD, AICD, stroke, gastric ulcer, depression with anxiety, CKD-3A, PAF not on anticoagulants, pericarditis, thoracic outlet syndrome, migraine, SVT, VSD (s/p repair x 2), who presents with SOB.  MD assessment includes COPD exacerbation.   OT comments  Ms Guilbault was seen for OT treatment on this date. Upon arrival to room pt seated on toilet, agreeable to tx. Pt IND for toileting including standing clothing mgmt. Pt requires SUPERVISION + 4WW for ADL t/f ~80 ft, SUP no AD ~19 ft. Cues for x1 standing rest break due to increased WOB with poor pleth on pulse ox. Educated on ECS and DME recs including [phone] for community mobility to conserve energy. Pt making good progress toward goals, will continue to follow POC. Discharge recommendation remains appropriate.       If plan is discharge home, recommend the following:  Help with stairs or ramp for entrance   Equipment Recommendations  Other (comment) (rollator)    Recommendations for Other Services      Precautions / Restrictions Precautions Precautions: None Recall of Precautions/Restrictions: Intact Restrictions Weight Bearing Restrictions Per Provider Order: No       Mobility Bed Mobility               General bed mobility comments: not tested    Transfers Overall transfer level: Independent                       Balance Overall balance assessment: No apparent balance deficits (not formally assessed)                                         ADL either performed or assessed with clinical judgement   ADL Overall ADL's : Needs assistance/impaired                                       General ADL  Comments: SUPERVISION + 4WW for ADL t/f ~80 ft, SUP no AD ~19 ft.      Cognition Arousal: Alert Behavior During Therapy: WFL for tasks assessed/performed Cognition: No apparent impairments                               Following commands: Intact                      Pertinent Vitals/ Pain       Pain Assessment Pain Assessment: No/denies pain   Frequency  Min 2X/week        Progress Toward Goals  OT Goals(current goals can now be found in the care plan section)  Progress towards OT goals: Progressing toward goals  Acute Rehab OT Goals OT Goal Formulation: With patient Time For Goal Achievement: 02/01/24 Potential to Achieve Goals: Good ADL Goals Pt Will Perform Grooming: Independently;standing Pt Will Perform Lower Body Dressing: Independently;sit to/from stand Pt Will Transfer to Toilet: Independently;ambulating;regular height toilet  Plan      Co-evaluation  AM-PAC OT 6 Clicks Daily Activity     Outcome Measure   Help from another person eating meals?: None Help from another person taking care of personal grooming?: None Help from another person toileting, which includes using toliet, bedpan, or urinal?: None Help from another person bathing (including washing, rinsing, drying)?: A Little Help from another person to put on and taking off regular upper body clothing?: None Help from another person to put on and taking off regular lower body clothing?: None 6 Click Score: 23    End of Session    OT Visit Diagnosis: Other abnormalities of gait and mobility (R26.89);Muscle weakness (generalized) (M62.81)   Activity Tolerance Patient tolerated treatment well   Patient Left in chair;with call bell/phone within reach   Nurse Communication          Time: 8546-8497 OT Time Calculation (min): 9 min  Charges: OT General Charges $OT Visit: 1 Visit OT Treatments $Therapeutic Activity: 8-22 mins  Elston Slot,  M.S. OTR/L  01/19/2024, 3:08 PM  ascom [phone]

## 2024-01-19 NOTE — Telephone Encounter (Signed)
 Copied from CRM [id number]. Topic: Clinical - Medication Question >> Jan 19, 2024  3:01 PM Essie A wrote: Reason for CRM: [facility] called to find out if a refill request was received for albuterol  (PROVENTIL ) (2.5 MG/3ML) 0.083% nebulizer solution.  Please return the call at [phone] to let them know the request was received.  Thanks.   [place] back and spoke to a staff member. I informed them that we had received the refill request, and had just sent in the albuterol  solution moments before the call. The employee verbalized understanding, NFN.

## 2024-01-20 ENCOUNTER — Ambulatory Visit: Admitting: Internal Medicine

## 2024-01-20 ENCOUNTER — Other Ambulatory Visit: Payer: Self-pay

## 2024-01-20 MED ORDER — PREDNISONE 20 MG PO TABS
ORAL_TABLET | ORAL | 0 refills | Status: AC
  Filled 2024-01-20: qty 12, 8d supply, fill #0

## 2024-01-20 MED ORDER — FUROSEMIDE 20 MG PO TABS: 20.0000 mg | ORAL_TABLET | Freq: Every day | ORAL | Status: DC

## 2024-01-20 NOTE — Progress Notes (Signed)
 Occupational Therapy Treatment Patient Details Name: Kimberly [name] MRN: [id number] DOB: [birth year] Today's Date: 01/20/2024   History of present illness Kimberly [name] is a 78 y.o. female with medical history significant of COPD on 2 liters oxygen  at night, sCHF with EF45-50%, HTN,  HLD, CAD, AICD, stroke, gastric ulcer, depression with anxiety, CKD-3A, PAF not on anticoagulants, pericarditis, thoracic outlet syndrome, migraine, SVT, VSD (s/p repair x 2), who presents with SOB.  MD assessment includes COPD exacerbation.   OT comments  Patient seen for OT treatment on this date. Upon arrival to room patient in bathroom with spouse performing ADL, OT observed and patient demonstrating good safety awareness and needing no A from spouse. Patient able to ambulate ~200 feet without AD and on RA, O2 remained above 94%. OT provided education on DME and energy conservation techniques that she can implement at home, patient receptive to education; patient has no further OT needs, OT to sign off- patient/spouse agreeable.       If plan is discharge home, recommend the following:  Help with stairs or ramp for entrance   Equipment Recommendations  None recommended by OT    Recommendations for Other Services      Precautions / Restrictions Precautions Precautions: None Restrictions Weight Bearing Restrictions Per Provider Order: No       Mobility Bed Mobility Overal bed mobility: Modified Independent                  Transfers Overall transfer level: Independent                       Balance Overall balance assessment: No apparent balance deficits (not formally assessed)                                         ADL either performed or assessed with clinical judgement   ADL Overall ADL's : Modified independent                                       General ADL Comments: patient performed ADL with supervision from spouse who is comfortable  providing level of A; patient demonstratres toileting, LB dressing and grooming tasks without any A; OT educated on energy conservation techniques, patient receptive    Extremity/Trunk Assessment Upper Extremity Assessment Upper Extremity Assessment: Overall WFL for tasks assessed            Vision       Perception     Praxis     Communication Communication Communication: No apparent difficulties   Cognition Arousal: Alert Behavior During Therapy: WFL for tasks assessed/performed                                 Following commands: Intact        Cueing   Cueing Techniques: Verbal cues, Tactile cues  Exercises      Shoulder Instructions       General Comments patient ambulated 200 feet without AD on RA, O2 remained above 94% throughout tx    Pertinent Vitals/ Pain       Pain Assessment Pain Assessment: No/denies pain  Home Living  Prior Functioning/Environment              Frequency           Progress Toward Goals  OT Goals(current goals can now be found in the care plan section)  Progress towards OT goals: Goals met/education completed, patient discharged from OT  Acute Rehab OT Goals Patient Stated Goal: to go home OT Goal Formulation: With patient Time For Goal Achievement: 02/01/24 Potential to Achieve Goals: Good ADL Goals Pt Will Perform Grooming: Independently;standing Pt Will Perform Lower Body Dressing: Independently;sit to/from stand Pt Will Transfer to Toilet: Independently;ambulating;regular height toilet  Plan      Co-evaluation                 AM-PAC OT 6 Clicks Daily Activity     Outcome Measure   Help from another person eating meals?: None Help from another person taking care of personal grooming?: None Help from another person toileting, which includes using toliet, bedpan, or urinal?: None Help from another person bathing (including  washing, rinsing, drying)?: None Help from another person to put on and taking off regular upper body clothing?: None Help from another person to put on and taking off regular lower body clothing?: None 6 Click Score: 24    End of Session    OT Visit Diagnosis: Other abnormalities of gait and mobility (R26.89);Muscle weakness (generalized) (M62.81)   Activity Tolerance Patient tolerated treatment well   Patient Left in bed;with call bell/phone within reach;with family/visitor present   Nurse Communication Mobility status        Time: 1020-1036 OT Time Calculation (min): 16 min  Charges: OT General Charges $OT Visit: 1 Visit OT Treatments $Self Care/Home Management : 8-22 mins  Rogers Clause, OT/L MSOT, 01/20/2024

## 2024-01-20 NOTE — Plan of Care (Signed)
IV removed, discharge instructions reviewed and patient discharged to home with husband

## 2024-01-20 NOTE — TOC Transition Note (Signed)
 Transition of Care [facility]) - Discharge Note   Patient Details  Name: Kimberly [name] MRN: [id number] Date of Birth: [birth year]  Transition of [facility]) CM/SW Contact:  Corean ONEIDA Haddock, RN Phone Number: 01/20/2024, 11:50 AM   Clinical Narrative:     No TOC needs identified MD to order Cardiopulmonary  rehab at dc        Patient Goals and CMS Choice            Discharge Placement                       Discharge Plan and Services Additional resources added to the After Visit Summary for                                       Social Drivers of Health (SDOH) Interventions SDOH Screenings   Food Insecurity: No Food Insecurity (01/18/2024)  Housing: Low Risk (01/18/2024)  Transportation Needs: No Transportation Needs (01/18/2024)  Utilities: Not At Risk (01/18/2024)  Alcohol Screen: Low Risk (12/02/2021)  Depression (PHQ2-9): Low Risk (01/15/2024)  Financial Resource Strain: Medium Risk (06/18/2023)  Physical Activity: Inactive (06/18/2023)  Social Connections: Socially Integrated (01/18/2024)  Stress: Stress Concern Present (06/18/2023)  Tobacco Use: Medium Risk (01/07/2024)     Readmission Risk Interventions     No data to display

## 2024-01-20 NOTE — Discharge Summary (Signed)
 "  Physician Discharge Summary   Kimberly [name]  female DOB: 10-18-1945  FMW:[id number]  PCP: [name], MD  Admit date: 01/17/2024 Discharge date: 01/20/2024  Admitted From: home Disposition:  home Husband updated at bedside prior to discharge. CODE STATUS: Full code   Hospital Course:  For full details, please see H&P, progress notes, consult notes and ancillary notes.  Briefly,  Kimberly [name] is a 78 y.o. female with medical history significant of COPD on 2 liters oxygen  at night, sCHF with EF45-50%, HTN, CAD, AICD, stroke, gastric ulcer, depression with anxiety, CKD-3A, PAF not on anticoagulants, pericarditis, thoracic outlet syndrome, SVT, VSD (s/p repair x 2), who presented with SOB.   Patient was recently hospitalized from 12/21 - 12/23 due to COPD exacerbation.  Patient completed course of antibiotics and was on prednisone  taper PTA.   COPD exacerbation [facility]):  Patient has decreased air movement bilaterally.  Wheezing resolved by next day. No infiltrate on chest x-ray.  Has negative PCR for COVID and flu and RSV. --pt was taking prednisone  10 mg daily at baseline Rx by outpatient pulmonologist.   --received IV solumedrol 125 mg on presentation, then 40 mg daily --transitioned to prednisone  40 mg daily on the day of discharge, and discharged on a prednisone  taper down to 10 mg daily, which pt will continue until outpatient f/u with pulm Dr. Darlean. --cont home bronchodilators  Chronic hypoxemic respiratory failure On 2L O2 nightly --currently no increased O2 requirement   Chronic systolic CHF (congestive heart failure) (HCC):  2D echo on 12/03/2023 showed EF of 45-50%.  Patient has trace leg edema, but has elevated BNP 2744.   --received IV Lasix  40 mg x4 doses and discharged on home lasix . - cont home spironolactone  --pt not taking Entresto  PTA, which is resumed.   CAD (coronary artery disease):  No chest pain -Aspirin , Crestor    HTN (hypertension) - cont home  spironolactone  and lasix  --pt not taking Entresto  PTA, which is resumed.   Dyslipidemia -Crestor    PAF (paroxysmal atrial fibrillation) (HCC):  heart rate 60-70s.  Patient is not taking bisoprolol  currently. - Continue digoxin  0.0625 mg daily   Chronic kidney disease, stage 3a [facility]):  Renal function stable.  Recent baseline creatinine 1.1-1.3.     Depression with anxiety --cont home Xanax    Unless noted above, medications under STOP list are ones pt was not taking PTA.  Discharge Diagnoses:  Principal Problem:   COPD exacerbation (HCC) Active Problems:   Chronic systolic CHF (congestive heart failure) (HCC)   CAD (coronary artery disease)   HTN (hypertension)   Dyslipidemia   PAF (paroxysmal atrial fibrillation) (HCC)   Chronic kidney disease, stage 3a (HCC)   Depression with anxiety   30 Day Unplanned Readmission Risk Score    Flowsheet Row ED to Hosp-Admission (Current) from 01/17/2024 in [facility]  30 Day Unplanned Readmission Risk Score (%) 20.01 Filed at 01/20/2024 0801    This score is the patient's risk of an unplanned readmission within 30 days of being discharged (0 -100%). The score is based on dignosis, age, lab data, medications, orders, and past utilization.   Low:  0-14.9   Medium: 15-21.9   High: 22-29.9   Extreme: 30 and above         Discharge Instructions:  Allergies as of 01/20/2024       Reactions   Mupirocin  Shortness Of Breath, Other (See Comments)   Burning, pain, swelling and sob   Codeine  Nausea Only        Medication List     STOP taking these medications    alendronate  70 MG tablet Commonly known as: FOSAMAX    amoxicillin -clavulanate 500-125 MG tablet Commonly known as: Augmentin    bisoprolol  5 MG tablet Commonly known as: ZEBETA    budesonide  0.25 MG/2ML nebulizer solution Commonly known as: Pulmicort        TAKE these medications    albuterol  108 (90 Base) MCG/ACT  inhaler Commonly known as: VENTOLIN  HFA INHALE 2 PUFFS INTO THE LUNGS EVERY 6 HOURS AS NEEDED FOR WHEEZING OR SHORTNESS OF BREATH What changed: Another medication with the same name was changed. Make sure you understand how and when to take each.   albuterol  (2.5 MG/3ML) 0.083% nebulizer solution Commonly known as: PROVENTIL  USE 1 VIAL (3 ML=2.5 MG TOTAL) BY NEBULIZATOIN EVERY 6 HOURS AS NEEDED FORWHEEZING OR SHORTNESS OF BREATH What changed: See the new instructions.   ALPRAZolam  0.5 MG tablet Commonly known as: XANAX  TAKE 1 TABLET BY MOUTH TWICE PER DAY AS NEEDED What changed: See the new instructions.   aspirin  81 MG tablet Take 1 tablet (81 mg total) by mouth daily.   Breztri  Aerosphere 160-9-4.8 MCG/ACT Aero inhaler Generic drug: budesonide -glycopyrrolate-formoterol  Inhale 2 puffs into the lungs in the morning and at bedtime.   Breztri  Aerosphere 160-9-4.8 MCG/ACT Aero inhaler Generic drug: budesonide -glycopyrrolate-formoterol  Inhale 2 puffs into the lungs in the morning and at bedtime.   digoxin  0.125 MG tablet Commonly known as: LANOXIN  Take 0.5 tablets (0.0625 mg total) by mouth daily. PLEASE SCHEDULE APPOINTMENT FOR MORE REFILLS [phone] OPTION 2   furosemide  20 MG tablet Commonly known as: LASIX  Take 1 tablet (20 mg total) by mouth daily. Home med. What changed:  how much to take when to take this additional instructions   predniSONE  20 MG tablet Commonly known as: DELTASONE  Take 40 mg (2 tabs) from 1/1-1/4, then 20 mg (1 tab) from 1/5-1/8, then 10 mg (your previous home dose) from 1/9 until you follow up with your pulmonologist. Start taking on: January 21, 2024 What changed:  medication strength additional instructions   rosuvastatin  10 MG tablet Commonly known as: CRESTOR  Take 1 tablet (10 mg total) by mouth daily.   sacubitril -valsartan  24-26 MG Commonly known as: Entresto  Take 1 tablet by mouth 2 (two) times daily.   spironolactone  25 MG  tablet Commonly known as: ALDACTONE  Take 1 tablet (25 mg total) by mouth at bedtime.   tizanidine  2 MG capsule Commonly known as: ZANAFLEX  Take 2 mg by mouth as needed for muscle spasms.   traMADol  50 MG tablet Commonly known as: ULTRAM  Take 50 mg by mouth every 6 (six) hours as needed.         Follow-up Information     [facility]. Go on 02/04/2024.   Specialty: Cardiology Why: Hospital Follow-Up 02/04/24 @ 2:00PM  [facility], [address], [address], Second Floor Please Bring all medications to follow-up appointment with you. Free Valet Parking at the door Contact information: [address] [place]  [address] [phone]        Darlean Ozell NOVAK, MD Follow up in 1 week(s).   Specialty: Pulmonary Disease Contact information: [address] [place] [address] [phone]                 Allergies[1]   The results of significant diagnostics from this hospitalization (including imaging, microbiology, ancillary and laboratory) are listed below for reference.  Consultations:   Procedures/Studies: DG Chest Port 1 View Result Date: 01/17/2024 EXAM: 1 VIEW(S) XRAY OF THE CHEST 01/17/2024 09:26:00 PM COMPARISON: 01/10/2024 CLINICAL HISTORY: Shortness of breath FINDINGS: LINES, TUBES AND DEVICES: Right-sided cardiac pacing device noted. LUNGS AND PLEURA: Stable coarsened interstitial markings. No focal pulmonary opacity. No pleural effusion. No pneumothorax. HEART AND MEDIASTINUM: Right-sided cardiac pacing device noted. Atherosclerotic calcifications of the aorta. BONES AND SOFT TISSUES: Sternotomy noted. IMPRESSION: 1. Stable coarsened interstitial markings. 2. Right-sided cardiac pacing device. 3. Sternotomy. 4. Atherosclerotic calcifications of the aorta. Electronically signed by: Dorethia Molt MD 01/17/2024 10:07 PM EST RP Workstation: [id number]   DG Chest Portable 1  View Result Date: 01/10/2024 EXAM: 1 VIEW(S) XRAY OF THE CHEST 01/10/2024 05:18:00 AM COMPARISON: AP and lateral radiographs of the chest dated 03/01/2023. CLINICAL HISTORY: SOB FINDINGS: LUNGS AND PLEURA: Prominent interstitial opacities within the lungs bilaterally, which have progressed in the interim. No pleural effusion. No pneumothorax. HEART AND MEDIASTINUM: The heart is mildly enlarged. Status post sternotomy. A cardiac pacemaker is present. BONES AND SOFT TISSUES: Status post sternotomy. IMPRESSION: 1. Progression of prominent interstitial opacities within the lungs bilaterally. 2. Mildly enlarged heart. Status post sternotomy and cardiac pacemaker. Electronically signed by: Evalene Coho MD 01/10/2024 05:20 AM EST RP Workstation: [id number]      Labs: BNP (last 3 results) Recent Labs    02/12/23 1034 02/26/23 1440 09/08/23 1636  BNP 263.3* 604.8* 336.6*   Basic Metabolic Panel: Recent Labs  Lab 01/17/24 2101 01/18/24 2007  NA 140 140  K 4.2 4.0  CL 100 95*  CO2 31 30  GLUCOSE 118* 251*  BUN 22 31*  CREATININE 1.07* 1.28*  CALCIUM  9.3 9.9  MG  --  2.1   Liver Function Tests: No results for input(s): AST, ALT, ALKPHOS, BILITOT, PROT, ALBUMIN in the last 168 hours. No results for input(s): LIPASE, AMYLASE in the last 168 hours. No results for input(s): AMMONIA in the last 168 hours. CBC: Recent Labs  Lab 01/17/24 2101 01/18/24 2007  WBC 7.3 10.8*  NEUTROABS 5.0  --   HGB 10.9* 11.8*  HCT 33.5* 36.1  MCV 94.4 92.6  PLT 235 260   Cardiac Enzymes: No results for input(s): CKTOTAL, CKMB, CKMBINDEX, TROPONINI in the last 168 hours. BNP: Invalid input(s): POCBNP CBG: Recent Labs  Lab 01/19/24 2152  GLUCAP 196*   D-Dimer No results for input(s): DDIMER in the last 72 hours. Hgb A1c No results for input(s): HGBA1C in the last 72 hours. Lipid Profile No results for input(s): CHOL, HDL, LDLCALC, TRIG, CHOLHDL,  LDLDIRECT in the last 72 hours. Thyroid  function studies No results for input(s): TSH, T4TOTAL, T3FREE, THYROIDAB in the last 72 hours.  Invalid input(s): FREET3 Anemia work up No results for input(s): VITAMINB12, FOLATE, FERRITIN, TIBC, IRON, RETICCTPCT in the last 72 hours. Urinalysis    Component Value Date/Time   COLORURINE YELLOW 12/07/2023 1118   APPEARANCEUR CLEAR 12/07/2023 1118   LABSPEC 1.025 12/07/2023 1118   PHURINE 5.5 12/07/2023 1118   GLUCOSEU NEGATIVE 12/07/2023 1118   HGBUR NEGATIVE 12/07/2023 1118   BILIRUBINUR SMALL (A) 12/07/2023 1118   KETONESUR NEGATIVE 12/07/2023 1118   PROTEINUR NEGATIVE 12/08/2017 1210   UROBILINOGEN 0.2 12/07/2023 1118   NITRITE NEGATIVE 12/07/2023 1118   LEUKOCYTESUR NEGATIVE 12/07/2023 1118   Sepsis Labs Recent Labs  Lab 01/17/24 2101 01/18/24 2007  WBC 7.3 10.8*   Microbiology Recent Results (from the past 240 hours)  MRSA Next Gen by PCR, Nasal  Status: None   Collection Time: 01/10/24  3:38 PM   Specimen: Nasal Mucosa; Nasal Swab  Result Value Ref Range Status   MRSA by PCR Next Gen NOT DETECTED NOT DETECTED Final    Comment: (NOTE) The GeneXpert MRSA Assay (FDA approved for NASAL specimens only), is one component of a comprehensive MRSA colonization surveillance program. It is not intended to diagnose MRSA infection nor to guide or monitor treatment for MRSA infections. Test performance is not FDA approved in patients less than 93 years old. Performed at [facility], [address] [place], [place] [address]   Resp panel by RT-PCR (RSV, Flu A&B, Covid) Anterior Nasal Swab     Status: None   Collection Time: 01/17/24  9:01 PM   Specimen: Anterior Nasal Swab  Result Value Ref Range Status   SARS Coronavirus 2 by RT PCR NEGATIVE NEGATIVE Final    Comment: (NOTE) SARS-CoV-2 target nucleic acids are NOT DETECTED.  The SARS-CoV-2 RNA is generally detectable in upper  respiratory specimens during the acute phase of infection. The lowest concentration of SARS-CoV-2 viral copies this assay can detect is 138 copies/mL. A negative result does not preclude SARS-Cov-2 infection and should not be used as the sole basis for treatment or other patient management decisions. A negative result may occur with  improper specimen collection/handling, submission of specimen other than nasopharyngeal swab, presence of viral mutation(s) within the areas targeted by this assay, and inadequate number of viral copies(<138 copies/mL). A negative result must be combined with clinical observations, patient history, and epidemiological information. The expected result is Negative.  Fact Sheet for Patients:  bloggercourse.com  Fact Sheet for Healthcare Providers:  seriousbroker.it  This test is no t yet approved or cleared by the [place]  FDA and  has been authorized for detection and/or diagnosis of SARS-CoV-2 by FDA under an Emergency Use Authorization (EUA). This EUA will remain  in effect (meaning this test can be used) for the duration of the COVID-19 declaration under Section 564(b)(1) of the Act, 21 U.S.C.section 360bbb-3(b)(1), unless the authorization is terminated  or revoked sooner.       Influenza A by PCR NEGATIVE NEGATIVE Final   Influenza B by PCR NEGATIVE NEGATIVE Final    Comment: (NOTE) The Xpert Xpress SARS-CoV-2/FLU/RSV plus assay is intended as an aid in the diagnosis of influenza from Nasopharyngeal swab specimens and should not be used as a sole basis for treatment. Nasal washings and aspirates are unacceptable for Xpert Xpress SARS-CoV-2/FLU/RSV testing.  Fact Sheet for Patients: bloggercourse.com  Fact Sheet for Healthcare Providers: seriousbroker.it  This test is not yet approved or cleared by the [place]  FDA and has been  authorized for detection and/or diagnosis of SARS-CoV-2 by FDA under an Emergency Use Authorization (EUA). This EUA will remain in effect (meaning this test can be used) for the duration of the COVID-19 declaration under Section 564(b)(1) of the Act, 21 U.S.C. section 360bbb-3(b)(1), unless the authorization is terminated or revoked.     Resp Syncytial Virus by PCR NEGATIVE NEGATIVE Final    Comment: (NOTE) Fact Sheet for Patients: bloggercourse.com  Fact Sheet for Healthcare Providers: seriousbroker.it  This test is not yet approved or cleared by the [place]  FDA and has been authorized for detection and/or diagnosis of SARS-CoV-2 by FDA under an Emergency Use Authorization (EUA). This EUA will remain in effect (meaning this test can be used) for the duration of the COVID-19 declaration under Section 564(b)(1) of the Act, 21 U.S.C.  section 360bbb-3(b)(1), unless the authorization is terminated or revoked.  Performed at [facility], [address] [place], [place] [address]   Expectorated Sputum Assessment w Gram Stain, Rflx to Resp Cult     Status: None   Collection Time: 01/18/24 10:36 AM   Specimen: Expectorated Sputum  Result Value Ref Range Status   Specimen Description EXPECTORATED SPUTUM  Final   Special Requests NONE  Final   Sputum evaluation   Final    Sputum specimen not acceptable for testing.  Please recollect.   C/RN MARHEVET HOOKER AT 1203 01/18/2024 DLB Performed at [facility], [address] [place], [place] [address]    Report Status 01/18/2024 FINAL  Final  Respiratory (~20 pathogens) panel by PCR     Status: None   Collection Time: 01/18/24 10:36 AM   Specimen: Nasopharyngeal Swab; Respiratory  Result Value Ref Range Status   Adenovirus NOT DETECTED NOT DETECTED Final   Coronavirus 229E NOT DETECTED NOT DETECTED Final    Comment: (NOTE) The Coronavirus on the Respiratory  Panel, DOES NOT test for the novel  Coronavirus (2019 nCoV)    Coronavirus HKU1 NOT DETECTED NOT DETECTED Final   Coronavirus NL63 NOT DETECTED NOT DETECTED Final   Coronavirus OC43 NOT DETECTED NOT DETECTED Final   Metapneumovirus NOT DETECTED NOT DETECTED Final   Rhinovirus / Enterovirus NOT DETECTED NOT DETECTED Final   Influenza A NOT DETECTED NOT DETECTED Final   Influenza B NOT DETECTED NOT DETECTED Final   Parainfluenza Virus 1 NOT DETECTED NOT DETECTED Final   Parainfluenza Virus 2 NOT DETECTED NOT DETECTED Final   Parainfluenza Virus 3 NOT DETECTED NOT DETECTED Final   Parainfluenza Virus 4 NOT DETECTED NOT DETECTED Final   Respiratory Syncytial Virus NOT DETECTED NOT DETECTED Final   Bordetella pertussis NOT DETECTED NOT DETECTED Final   Bordetella Parapertussis NOT DETECTED NOT DETECTED Final   Chlamydophila pneumoniae NOT DETECTED NOT DETECTED Final   Mycoplasma pneumoniae NOT DETECTED NOT DETECTED Final    Comment: Performed at [facility], [address] [place], [place] [address]     Total time spend on discharging this patient, including the last patient exam, discussing the hospital stay, instructions for ongoing care as it relates to all pertinent caregivers, as well as preparing the medical discharge records, prescriptions, and/or referrals as applicable, is 35 minutes.    Ellouise Haber, MD  [facility] 01/20/2024, 11:16 AM       [1]  Allergies Allergen Reactions   Mupirocin  Shortness Of Breath and Other (See Comments)    Burning, pain, swelling and sob   Codeine Nausea Only   "

## 2024-01-22 ENCOUNTER — Telehealth: Payer: Self-pay

## 2024-01-22 NOTE — Transitions of Care (Post Inpatient/ED Visit) (Signed)
" ° °  01/22/2024  Name: Kimberly [name] MRN: [id number] DOB: Dec 19, 1945  Today's TOC FU Call Status: Today's TOC FU Call Status:: Unsuccessful Call (1st Attempt) Unsuccessful Call (1st Attempt) Date: 01/22/24 (patient answered and reports she is too tired to talk on the phone. Request call back another day.)  Attempted to reach the patient regarding the most recent Inpatient/ED visit.  Follow Up Plan: Additional outreach attempts will be made to reach the patient to complete the Transitions of Care (Post Inpatient/ED visit) call.   Alan Ee, RN, BSN, CEN Population Health- Transition of Care Team.  [facility] [phone]  "

## 2024-01-25 ENCOUNTER — Telehealth: Payer: Self-pay | Admitting: *Deleted

## 2024-01-25 NOTE — Transitions of Care (Post Inpatient/ED Visit) (Signed)
 "  01/25/2024  Name: Kimberly [name] MRN: [id number] DOB: 1945-08-01  Today's TOC FU Call Status: Today's TOC FU Call Status:: Successful TOC FU Call Completed TOC FU Call Complete Date: 01/25/24  Patient's Name and Date of Birth confirmed. Name, DOB  Transition Care Management Follow-up Telephone Call Date of Discharge: 01/20/24 Discharge Facility: [facility]) Type of Discharge: Inpatient Admission Primary Inpatient Discharge Diagnosis:: COPD exacerbation How have you been since you were released from the hospital?: Better (I am doing fine now; I really don't like all of these phone calls- please make it short, I am getting ready to take my breathing treatment- I don't want regular phone calls.  I am fine and know to call the doctor if I have any concerns) Any questions or concerns?: No  Items Reviewed: Did you receive and understand the discharge instructions provided?: Yes (briefly reviewed with patient who requested short TOC call) Medications obtained,verified, and reconciled?: Partial Review Completed (Partial medication review completed; confirmed per patient report, obtained/ is taking all newly Rx'd medications as instructed; self-manages medications and denies questions/ concerns around medications today) Reason for Partial Mediation Review: Patient declined full medication review during TOC call: requested short TOC call; stated I have all the medications I am supposed to take and am taking them like I was told to Any new allergies since your discharge?: No Dietary orders reviewed?: Yes Type of Diet Ordered:: healthy Do you have support at home?: Yes People in Home [RPT]: spouse Name of Support/Comfort Primary Source: Reports independent in self-care activities; resides with supportive spouse- assists as/ if needed/ indicated  Medications Reviewed Today: Medications Reviewed Today     Reviewed by Maxine Huynh M, RN (Registered Nurse) on  01/25/24 at 1310  Med List Status: <None>   Medication Order Taking? Sig Documenting Provider Last Dose Status Informant  albuterol  (PROVENTIL ) (2.5 MG/3ML) 0.083% nebulizer solution [id number] Yes USE 1 VIAL (3 ML=2.5 MG TOTAL) BY NEBULIZATOIN EVERY 6 HOURS AS NEEDED FORWHEEZING OR SHORTNESS OF [name], MD  Active   albuterol  (VENTOLIN  HFA) 108 (90 Base) MCG/ACT inhaler [id number]  INHALE 2 PUFFS INTO THE LUNGS EVERY 6 HOURS AS NEEDED FOR WHEEZING OR SHORTNESS OF [name], MD  Active Pharmacy Records, Self, Spouse/Significant Other  ALPRAZolam  (XANAX ) 0.5 MG tablet [id number]  TAKE 1 TABLET BY MOUTH TWICE PER DAY AS NEEDED  Patient taking differently: Take 0.5 mg by mouth at bedtime. TAKE 1 TABLET BY MOUTH TWICE PER DAY AS NEEDED   [name], MD  Active Pharmacy Records, Self, Spouse/Significant Other  aspirin  81 MG tablet [id number]  Take 1 tablet (81 mg total) by mouth daily. [name], MD  Active Self, Pharmacy Records, Spouse/Significant Other  Budeson-Glycopyrrol-Formoterol  (BREZTRI  AEROSPHERE) 160-9-4.8 MCG/ACT TERESE [id number]  Inhale 2 puffs into the lungs in the morning and at bedtime. Parrett, Madelin RAMAN, NP  Active Pharmacy Records, Self, Spouse/Significant Other  budesonide -glycopyrrolate-formoterol  (BREZTRI  AEROSPHERE) 160-9-4.8 MCG/ACT AERO inhaler [id number]  Inhale 2 puffs into the lungs in the morning and at bedtime. [name], NP  Active Pharmacy Records, Self, Spouse/Significant Other  digoxin  (LANOXIN ) 0.125 MG tablet [id number]  Take 0.5 tablets (0.0625 mg total) by mouth daily. PLEASE SCHEDULE APPOINTMENT FOR MORE REFILLS [phone] OPTION 2 Stuttgart, Harlene HERO, FNP  Active Pharmacy Records, Self, Spouse/Significant Other  furosemide  (LASIX ) 20 MG tablet [id number]  Take 1 tablet (20 mg total) by mouth daily. Home med. Awanda City, MD  Active  predniSONE  (DELTASONE ) 20 MG tablet [id number] Yes Take 2 tablets (40 mg total) by mouth daily for 4 days  (1/1-1/4), THEN 1 tablet (20 mg total) daily for 4 days (1/5-1/8). Then 10 mg (your previous home dose) from 1/9 until you follow up with your pulmonologist. Awanda City, MD  Active   rosuvastatin  (CRESTOR ) 10 MG tablet [id number]  Take 1 tablet (10 mg total) by mouth daily. Franchester, Cadence H, PA-C  Active Pharmacy Records, Self, Spouse/Significant Other  sacubitril -valsartan  (ENTRESTO ) 24-26 MG [id number]  Take 1 tablet by mouth 2 (two) times daily. [name], MD  Active Pharmacy Records, Self, Spouse/Significant Other  spironolactone  (ALDACTONE ) 25 MG tablet [id number]  Take 1 tablet (25 mg total) by mouth at bedtime. [name], MD  Active Pharmacy Records, Self, Spouse/Significant Other  tizanidine  (ZANAFLEX ) 2 MG capsule [id number]  Take 2 mg by mouth as needed for muscle spasms. [provider]  Active Pharmacy Records, Self, Spouse/Significant Other  traMADol  (ULTRAM ) 50 MG tablet [id number]  Take 50 mg by mouth every 6 (six) hours as needed. [provider]  Active Pharmacy Records, Self, Spouse/Significant Other           Home Care and Equipment/Supplies: Were Home Health Services Ordered?: No Any new equipment or medical supplies ordered?: No  Functional Questionnaire: Do you need assistance with bathing/showering or dressing?: No Do you need assistance with meal preparation?: No Do you need assistance with eating?: No Do you have difficulty maintaining continence: No Do you need assistance with getting out of bed/getting out of a chair/moving?: No Do you have difficulty managing or taking your medications?: No  Follow up appointments reviewed: PCP Follow-up appointment confirmed?: No MD Provider Line [phone] Given: No (verified well-established with current PCP - however, has new patient appointment with new PCP at [facility] 02/12/24- in light of current PCP pending retirement in January 2026; declines scheduling with current PCP for hospital follow up  office visit) Specialist Hospital Follow-up appointment confirmed?: Yes Date of Specialist follow-up appointment?: 01/26/24 Follow-Up Specialty Provider:: Pulmonary provider: Dr. Darlean Do you need transportation to your follow-up appointment?: No Do you understand care options if your condition(s) worsen?: Yes-patient verbalized understanding  SDOH Interventions Today    Flowsheet Row Most Recent Value  SDOH Interventions   Food Insecurity Interventions Intervention Not Indicated  Housing Interventions Intervention Not Indicated  Transportation Interventions Intervention Not Indicated  [drives self short distances,  spouse assists with transportation as-if indicated]  Utilities Interventions Intervention Not Indicated   See TOC assessment tabs for additional assessment/ TOC intervention information  Pls call/ message for questions,  Anzal Bartnick Mckinney Hattie Pine, RN, BSN, Media Planner  Transitions of Care  [facility]  [place] [phone]: direct office  "

## 2024-01-26 ENCOUNTER — Encounter: Payer: Self-pay | Admitting: Internal Medicine

## 2024-01-26 ENCOUNTER — Ambulatory Visit: Admitting: Internal Medicine

## 2024-01-26 VITALS — BP 130/60 | HR 78 | Temp 98.3°F | Ht 61.0 in | Wt 102.2 lb

## 2024-01-26 DIAGNOSIS — J9611 Chronic respiratory failure with hypoxia: Secondary | ICD-10-CM | POA: Diagnosis not present

## 2024-01-26 DIAGNOSIS — J441 Chronic obstructive pulmonary disease with (acute) exacerbation: Secondary | ICD-10-CM

## 2024-01-26 DIAGNOSIS — Z87891 Personal history of nicotine dependence: Secondary | ICD-10-CM | POA: Diagnosis not present

## 2024-01-26 MED ORDER — BREZTRI AEROSPHERE 160-9-4.8 MCG/ACT IN AERO: INHALATION_SPRAY | RESPIRATORY_TRACT | Status: DC

## 2024-01-26 NOTE — Assessment & Plan Note (Addendum)
?   Quit smokng 07/2020  - PFT's  01/12/17   FEV1 0.88 (44 % ) ratio 0.51  p 16 % improvement from saba p ? prior to study with DLCO  9.68 (47%) corrects to 3.47 (78%)  for alv volume and FV curve slt atypical concave pattern  - 09/05/2019   try breztri   - Labs ordered 01/25/2020  :    alpha one AT phenotype  MS  Level 134  - 03/11/2023  After extensive coaching inhaler device,  effectiveness =    near 0 (very sort ti) > rec change to alb/bud 0.25 mg bid as add on to breztri  maint  - 09/22/2023  After extensive coaching inhaler device,  effectiveness =  25% with fha/added flutter valve  - 09/22/2023 added prednisone  as plan D = 10  mg x 2 until bettter, then x1 x 5 days then 1/2 daily maint  and use bud/alb as plan C  - LDSCT   11/16/23    Moderatecentrilobular emphysema    Group E in terms of symptoms/risk so  laba/lama/ICS  therefore appropriate rx at this point  >>>  breztri    and approp SABA prn.  For cough  >>> miucinex max dose plus flutter use reviewed    - 01/26/2024  After extensive coaching inhaler device,  effectiveness =    50% from a baseline of 25% so  >>> needs alb/bud as her plan C as above and consider for trial of ohtuvayre  once she return to assure accurate home med rx with all meds in hand using a trust but verify approach to confirm accurate Medication  Reconciliation The principal here is that until we are certain that the  patients are doing what we've asked, it makes no sense to ask them to do more.

## 2024-01-26 NOTE — Progress Notes (Signed)
 "  Kimberly [name], female    DOB: 1945-02-11     MRN: [id number]   Brief patient profile:  79 yowf quit smoking 01/07/24   GOLD III  referred to pulmonary clinic 09/05/2019 by Dr  Rolan for sob     History of Present Illness  09/05/2019  Pulmonary/ 1st office eval/[facility]  - no maint rx  Chief Complaint  Patient presents with   Follow-up    pt needs help to quit smoking.pt is wearing patches .pt can'y take chantix   Dyspnea:  Does [employer] [employer] s maint rx but struggles  Cough: typical for her takes 4-5 coughs with mucoid mucus and then  good to go  Sleep: sleeps bed flat/ one pillow on side  SABA use: one  5-6 x daily Rec Plan A = Automatic = Always=    Breztri  Take 2 puffs first thing in am and then another 2 puffs about 12 hours later.  Work on inhaler technique: Plan B = Backup (to supplement plan A, not to replace it) Only use your albuterol  inhaler as a rescue medication Prednisone  10 mg take  4 each am x 2 days,   2 each am x 2 days,  1 each am x 2 days and stop  The key is to stop smoking completely before smoking completely stops you! Add: needs alpha one AT screen next ov     09/22/2023  f/u ov/Chenise Mulvihill re: GOLD 3 copd/02 dep    maint on breztri  / pred 5 mg daily   Chief Complaint  Patient presents with   Cough    Cough last week with some mucus production.  Doing better this week.   Dyspnea:  still pushing cart most of the time at [employer]  Cough: very congested min thick mucoid  Sleeping: flat bed with 2 pillows  resp cc  SABA use: hfa  0 - 2 x hfa / none nebulizer  02: 2lpm hs   Lung cancer screening :  due 1st week in October   Patient Instructions  For cough/ congestion > mucinex  or mucinex  dm  up to maximum of  1200 mg every 12 hours and use the flutter valve as much as you can   Plan A = Automatic = Always=    Breztri  Take 2 puffs first thing in am and then another 2 puffs about 12 hours later  - prednisone  5 mg daily is your floor Work on inhaler technique:   >>>   Remember how golfers warm up by taking practice swings - do this with an empty inhaler  Plan B = Backup (to supplement plan A, not to replace it) Use your albuterol  inhaler as a rescue medication   Plan C = Crisis (instead of Plan B but only if Plan B stops working) - only use your albuterol -budesonide  nebulizer if you first try Plan B and it fails to help > ok to use the nebulizer up to every 4 hours but if start needing it regularly call for immediate appointment Plan D = prednisone  10 mg if ABC not working  Use 2 daily until better then 1 daily x 5 days and then one-half daily  Continue with 2lpm at bedtime but no need for  daytime as long as 02 sats over 90%     Lung cancer screening :  11/16/23  RADS  RADS   Moderatecentrilobular emphysema with diffuse bronchial wall thickening. No acute consolidative airspace disease or lung masses. New 5.3 mm solid posterior  left upper lobe nodule on image 141. No additional new significant pulmonary nodules.> rec f/u 6 m    01/26/2024  f/u ov/Brett Darko re: GOLD 3 copd/02 dep    maint on breztri   last smoked one month prior to OV    Chief Complaint  Patient presents with   Hospitalization Follow-up    ED f/u- pt complains of troubled breathing during exertion. Coughing w clear phlegm.   Dyspnea:  has not returned to [employer] [employer]  - ok room to room  Cough: clear mucus worse in am  Sleeping: flat bed 2 pillows one spell this am cough  SABA use: neb qid  02: 2lpm bed time but doesn't think she needs it and not checking daytime     No obvious day to day or daytime variability or assoc excess/ purulent sputum or mucus plugs or hemoptysis or cp or chest tightness, subjective wheeze or overt sinus or hb symptoms.    Also denies any obvious fluctuation of symptoms with weather or environmental changes or other aggravating or alleviating factors except as outlined above   No unusual exposure hx or h/o childhood pna/ asthma or knowledge of premature  birth.  Current Allergies, Complete Past Medical History, Past Surgical History, Family History, and Social History were reviewed in [employer] record.  ROS  The following are not active complaints unless bolded Hoarseness, sore throat, dysphagia, dental problems, itching, sneezing,  nasal congestion or discharge of excess mucus or purulent secretions, ear ache,   fever, chills, sweats, unintended wt loss or wt gain, classically pleuritic or exertional cp,  orthopnea pnd or arm/hand swelling  or leg swelling, presyncope, palpitations, abdominal pain, anorexia, nausea, vomiting, diarrhea  or change in bowel habits or change in bladder habits, change in stools or change in urine, dysuria, hematuria,  rash, arthralgias, visual complaints, headache, numbness, weakness or ataxia or problems with walking or coordination,  change in mood or  memory.          Outpatient Medications Prior to Visit  Medication Sig Dispense Refill   albuterol  (PROVENTIL ) (2.5 MG/3ML) 0.083% nebulizer solution USE 1 VIAL (3 ML=2.5 MG TOTAL) BY NEBULIZATOIN EVERY 6 HOURS AS NEEDED FORWHEEZING OR SHORTNESS OF BREATH 90 mL 5   albuterol  (VENTOLIN  HFA) 108 (90 Base) MCG/ACT inhaler INHALE 2 PUFFS INTO THE LUNGS EVERY 6 HOURS AS NEEDED FOR WHEEZING OR SHORTNESS OF BREATH 8.5 g 5   ALPRAZolam  (XANAX ) 0.5 MG tablet TAKE 1 TABLET BY MOUTH TWICE PER DAY AS NEEDED 60 tablet 5   aspirin  81 MG tablet Take 1 tablet (81 mg total) by mouth daily. 100 tablet 99   Budeson-Glycopyrrol-Formoterol  (BREZTRI  AEROSPHERE) 160-9-4.8 MCG/ACT AERO Inhale 2 puffs into the lungs in the morning and at bedtime. 1 each 11   budesonide -glycopyrrolate-formoterol  (BREZTRI  AEROSPHERE) 160-9-4.8 MCG/ACT AERO inhaler Inhale 2 puffs into the lungs in the morning and at bedtime. 3 each 4   digoxin  (LANOXIN ) 0.125 MG tablet Take 0.5 tablets (0.0625 mg total) by mouth daily. PLEASE SCHEDULE APPOINTMENT FOR MORE REFILLS [phone] OPTION 2 45  tablet 0   furosemide  (LASIX ) 20 MG tablet Take 1 tablet (20 mg total) by mouth daily. Home med.     predniSONE  (DELTASONE ) 20 MG tablet Take 2 tablets (40 mg total) by mouth daily for 4 days (1/1-1/4), THEN 1 tablet (20 mg total) daily for 4 days (1/5-1/8). Then 10 mg (your previous home dose) from 1/9 until you follow up with your pulmonologist. 12 tablet 0  rosuvastatin  (CRESTOR ) 10 MG tablet Take 1 tablet (10 mg total) by mouth daily. 90 tablet 3   spironolactone  (ALDACTONE ) 25 MG tablet Take 1 tablet (25 mg total) by mouth at bedtime. 30 tablet 6   tizanidine  (ZANAFLEX ) 2 MG capsule Take 2 mg by mouth as needed for muscle spasms.     traMADol  (ULTRAM ) 50 MG tablet Take 50 mg by mouth every 6 (six) hours as needed.     sacubitril -valsartan  (ENTRESTO ) 24-26 MG Take 1 tablet by mouth 2 (two) times daily. (Patient not taking: Reported on 01/26/2024) 60 tablet 11   No facility-administered medications prior to visit.            Past Medical History:  Diagnosis Date   AICD (automatic cardioverter/defibrillator) present 03/17/2017   Anxiety    Atrial tachycardia (HCC)    Bursitis of shoulder, right, adhesive    CHF (congestive heart failure) (HCC)    Chronic bronchitis (HCC)    1-2 times/yr (01/23/2014)   COPD (chronic obstructive pulmonary disease) (HCC)    CVD (cerebrovascular disease)    Dyslipidemia    Dysrhythmia    Frequency of urination    GERD (gastroesophageal reflux disease)    Heart murmur    History of stomach ulcers    HTN (hypertension) 02/22/2011   Migraines    stopped many years ago (06/14/2014)   Osteoporosis 08/19/2016   Pericarditis    Pneumonia 10 times (06/14/2014)   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)    Silent myocardial infarction [facility]) late 1990's   Stress incontinence    was suppose to have been tacked up years ago but I didn't do it   Syncope, near    Associated with atrial tachycardia-event recorder 1/16    Thoracic outlet syndrome    VSD (ventricular septal defect)        Objective:    Wts  01/26/2024         102  09/22/2023         108  03/11/2023       110  07/27/2020         106  01/25/20 105 lb (47.6 kg)  12/06/19 109 lb (49.4 kg)  12/05/19 106 lb (48.1 kg)     Vital signs reviewed  01/26/2024  - Note at rest 02 sats  97% on RA   General appearance:    almb wf smoker's rattle   HEENT : Oropharynx  clear        NECK :  without  apparent JVD/ palpable Nodes/TM    LUNGS: no acc muscle use,  Nl contour chest which is clear to A and P bilaterally without cough on insp or exp maneuvers   CV:  RRR  no s3 or murmur or increase in P2, and no edema   ABD:  soft and nontender   MS:  Gait nl   ext warm without deformities Or obvious joint restrictions  calf tenderness, cyanosis or clubbing    SKIN: warm and dry without lesions    NEURO:  alert, approp, nl sensorium with  no motor or cerebellar deficits apparent.                Assessment   Assessment & Plan Chronic respiratory failure with hypoxia (HCC) 02 dep as of 03/11/2023 at 2lpm hs  - .Patient walked at slow pace on 2lpm cont  walked 1/4 of lap 1 with SaO2 down to 84%, Applied 5L POC pulsed oxygen . Was  unable to get patients SaO2 above 90%. Notified Dr. Darlean. Per Dr. Darlean, order Best Fit for patient and do not order POC.  - 09/22/2023   Walked on RA   x  3  lap(s) =  approx 750   ft  @ mod pace, stopped due to sob  with lowest 02 sats 94%   - 01/26/2024  no desats walking so rec  >>>   ONO on RA  to see if she can d/c 02 completely   COPD exacerbation (HCC) ? Quit smokng 07/2020  - PFT's  01/12/17   FEV1 0.88 (44 % ) ratio 0.51  p 16 % improvement from saba p ? prior to study with DLCO  9.68 (47%) corrects to 3.47 (78%)  for alv volume and FV curve slt atypical concave pattern  - 09/05/2019   try breztri   - Labs ordered 01/25/2020  :    alpha one AT phenotype  MS  Level 134  - 03/11/2023  After extensive coaching inhaler device,   effectiveness =    near 0 (very sort ti) > rec change to alb/bud 0.25 mg bid as add on to breztri  maint  - 09/22/2023  After extensive coaching inhaler device,  effectiveness =  25% with fha/added flutter valve  - 09/22/2023 added prednisone  as plan D = 10  mg x 2 until bettter, then x1 x 5 days then 1/2 daily maint  and use bud/alb as plan C  - LDSCT   11/16/23    Moderatecentrilobular emphysema    Group E in terms of symptoms/risk so  laba/lama/ICS  therefore appropriate rx at this point  >>>  breztri    and approp SABA prn.  For cough  >>> miucinex max dose plus flutter use reviewed    - 01/26/2024  After extensive coaching inhaler device,  effectiveness =    50% from a baseline of 25% so  >>> needs alb/bud as her plan C as above and consider for trial of ohtuvayre  once she return to assure accurate home med rx with all meds in hand using a trust but verify approach to confirm accurate Medication  Reconciliation The principal here is that until we are certain that the  patients are doing what we've asked, it makes no sense to ask them to do more.    Former cigarette smoker Quit 01/04/24   >>> reinforced importance of maint rx   >>> still candidagte fo LDSCT thru age 43      Each maintenance medication was reviewed in detail including emphasizing most importantly the difference between maintenance and prns and under what circumstances the prns are to be triggered using an action plan format where appropriate.  Total time for H and P, chart review, counseling, reviewing hfa/neb  device(s) , directly observing portions of ambulatory 02 saturation study/ and generating customized AVS unique to this office visit / same day charting = 44 min  for multiple  refractory respiratory  symptoms of uncertain etiology                  AVS  Patient Instructions  For cough/ congestion > mucinex  or mucinex  dm  up to maximum of  1200 mg every 12 hours and use the flutter valve as much as you can      Plan A = Automatic = Always=    Breztri  Take 2 puffs first thing in am and then another 2 puffs about 12 hours later  - prednisone  5 mg daily  is your floor   Work on inhaler technique:  relax and gently blow all the way out then take a nice smooth full deep breath back in, triggering the inhaler at same time you start breathing in.  Hold breath in for at least  5 seconds if you can. Blow out Breztri   thru nose. Rinse and gargle with water when done.  If mouth or throat bother you at all,  try brushing teeth/gums/tongue with arm and hammer toothpaste/ make a slurry and gargle and spit out.   >>>  Remember how golfers warm up by taking practice swings - do this with an empty inhaler    Plan B = Backup (to supplement plan A, not to replace it) Use your albuterol  inhaler as a rescue medication to be used if you can't catch your breath by resting or slowing your pace  or doing a relaxed purse lip breathing pattern.  - The less you use it, the better it will work when you need it. - Ok to use the inhaler up to 2 puffs  every 4 hours if you must but call for appointment if use goes up over your usual need - Don't leave home without it !!  (think of it like the spare tire or starter fluid for your car)   Plan C = Crisis (instead of Plan B but only if Plan B stops working) - only use your albuterol -budesonide  nebulizer if you first try Plan B and it fails to help > ok to use the nebulizer up to every 4 hours but if start needing it regularly call for immediate appointment  Plan D = prednisone  10 mg if ABC not working  Use 2 daily until better then 1 daily x 5 days and then one-half daily   Continue with 2lpm at bedtime but no need for  daytime as long as 02 sats over 90%    Please schedule a follow up office visit in 4 weeks, sooner if needed  with all medications /inhalers/ solutions in hand so we can verify exactly what you are taking. This includes all medications from all doctors and over the  counters        Ozell America, MD 01/30/2024              .        "

## 2024-01-26 NOTE — Patient Instructions (Signed)
 For cough/ congestion > mucinex  or mucinex  dm  up to maximum of  1200 mg every 12 hours and use the flutter valve as much as you can     Plan A = Automatic = Always=    Breztri  Take 2 puffs first thing in am and then another 2 puffs about 12 hours later  - prednisone  5 mg daily is your floor   Work on inhaler technique:  relax and gently blow all the way out then take a nice smooth full deep breath back in, triggering the inhaler at same time you start breathing in.  Hold breath in for at least  5 seconds if you can. Blow out Breztri   thru nose. Rinse and gargle with water when done.  If mouth or throat bother you at all,  try brushing teeth/gums/tongue with arm and hammer toothpaste/ make a slurry and gargle and spit out.   >>>  Remember how golfers warm up by taking practice swings - do this with an empty inhaler    Plan B = Backup (to supplement plan A, not to replace it) Use your albuterol  inhaler as a rescue medication to be used if you can't catch your breath by resting or slowing your pace  or doing a relaxed purse lip breathing pattern.  - The less you use it, the better it will work when you need it. - Ok to use the inhaler up to 2 puffs  every 4 hours if you must but call for appointment if use goes up over your usual need - Don't leave home without it !!  (think of it like the spare tire or starter fluid for your car)   Plan C = Crisis (instead of Plan B but only if Plan B stops working) - only use your albuterol -budesonide  nebulizer if you first try Plan B and it fails to help > ok to use the nebulizer up to every 4 hours but if start needing it regularly call for immediate appointment  Plan D = prednisone  10 mg if ABC not working  Use 2 daily until better then 1 daily x 5 days and then one-half daily   Continue with 2lpm at bedtime but no need for  daytime as long as 02 sats over 90%    Please schedule a follow up office visit in 4 weeks, sooner if needed  with all medications  /inhalers/ solutions in hand so we can verify exactly what you are taking. This includes all medications from all doctors and over the counters

## 2024-01-26 NOTE — Assessment & Plan Note (Addendum)
 02 dep as of 03/11/2023 at 2lpm hs  - .Patient walked at slow pace on 2lpm cont  walked 1/4 of lap 1 with SaO2 down to 84%, [employer] 5L POC pulsed oxygen . Was unable to get patients SaO2 above 90%. Notified Dr. Darlean. Per Dr. Darlean, order Best Fit for patient and do not order POC.  - 09/22/2023   Walked on RA   x  3  lap(s) =  approx 750   ft  @ mod pace, stopped due to sob  with lowest 02 sats 94%   - 01/26/2024  no desats walking so rec  >>>   ONO on RA  to see if she can d/c 02 completely

## 2024-01-29 ENCOUNTER — Other Ambulatory Visit: Payer: Self-pay

## 2024-01-29 DIAGNOSIS — J449 Chronic obstructive pulmonary disease, unspecified: Secondary | ICD-10-CM

## 2024-01-29 MED ORDER — ALBUTEROL SULFATE HFA 108 (90 BASE) MCG/ACT IN AERS: 2.0000 | INHALATION_SPRAY | Freq: Four times a day (QID) | RESPIRATORY_TRACT | 5 refills | Status: AC | PRN

## 2024-01-30 NOTE — Assessment & Plan Note (Addendum)
 Quit 01/04/24   >>> reinforced importance of maint rx   >>> still candidagte fo LDSCT thru age 79      Each maintenance medication was reviewed in detail including emphasizing most importantly the difference between maintenance and prns and under what circumstances the prns are to be triggered using an action plan format where appropriate.  Total time for H and P, chart review, counseling, reviewing hfa/neb  device(s) , directly observing portions of ambulatory 02 saturation study/ and generating customized AVS unique to this office visit / same day charting = 44 min  for multiple  refractory respiratory  symptoms of uncertain etiology

## 2024-02-01 ENCOUNTER — Telehealth: Payer: Self-pay

## 2024-02-01 NOTE — Telephone Encounter (Signed)
 Received CMN from [facility] for pt's oxygen . Placed in Tammy Parrett's sign folder in B Pod. Once signed, will fax back to [facility] at [phone]

## 2024-02-03 ENCOUNTER — Telehealth: Payer: Self-pay | Admitting: Cardiology

## 2024-02-03 NOTE — Telephone Encounter (Signed)
 Called to confirm/remind patient of their appointment at the [facility] on 02/04/24.   Appointment:   [x] Confirmed  [] Left mess   [] No answer/No voice mail  [] VM Full/unable to leave message  [] Phone not in service  Patient reminded to bring all medications and/or complete list.  Confirmed patient has transportation. Gave directions, instructed to utilize valet parking.

## 2024-02-04 ENCOUNTER — Other Ambulatory Visit: Payer: Self-pay

## 2024-02-04 ENCOUNTER — Telehealth: Payer: Self-pay

## 2024-02-04 ENCOUNTER — Other Ambulatory Visit (HOSPITAL_COMMUNITY): Payer: Self-pay

## 2024-02-04 ENCOUNTER — Ambulatory Visit: Attending: Cardiology | Admitting: Cardiology

## 2024-02-04 VITALS — BP 111/47 | HR 67 | Wt 105.2 lb

## 2024-02-04 DIAGNOSIS — I2510 Atherosclerotic heart disease of native coronary artery without angina pectoris: Secondary | ICD-10-CM | POA: Diagnosis not present

## 2024-02-04 DIAGNOSIS — I428 Other cardiomyopathies: Secondary | ICD-10-CM | POA: Diagnosis not present

## 2024-02-04 DIAGNOSIS — Z95810 Presence of automatic (implantable) cardiac defibrillator: Secondary | ICD-10-CM | POA: Diagnosis not present

## 2024-02-04 DIAGNOSIS — Z87891 Personal history of nicotine dependence: Secondary | ICD-10-CM | POA: Diagnosis not present

## 2024-02-04 DIAGNOSIS — Z7982 Long term (current) use of aspirin: Secondary | ICD-10-CM | POA: Insufficient documentation

## 2024-02-04 DIAGNOSIS — E785 Hyperlipidemia, unspecified: Secondary | ICD-10-CM | POA: Insufficient documentation

## 2024-02-04 DIAGNOSIS — R0602 Shortness of breath: Secondary | ICD-10-CM | POA: Diagnosis present

## 2024-02-04 DIAGNOSIS — I5022 Chronic systolic (congestive) heart failure: Secondary | ICD-10-CM | POA: Insufficient documentation

## 2024-02-04 DIAGNOSIS — J449 Chronic obstructive pulmonary disease, unspecified: Secondary | ICD-10-CM | POA: Insufficient documentation

## 2024-02-04 DIAGNOSIS — Z79899 Other long term (current) drug therapy: Secondary | ICD-10-CM | POA: Insufficient documentation

## 2024-02-04 DIAGNOSIS — Z8774 Personal history of (corrected) congenital malformations of heart and circulatory system: Secondary | ICD-10-CM | POA: Insufficient documentation

## 2024-02-04 DIAGNOSIS — I739 Peripheral vascular disease, unspecified: Secondary | ICD-10-CM | POA: Diagnosis not present

## 2024-02-04 MED ORDER — FUROSEMIDE 20 MG PO TABS
ORAL_TABLET | ORAL | 5 refills | Status: DC
  Filled 2024-02-04: qty 45, 15d supply, fill #0

## 2024-02-04 MED ORDER — POTASSIUM CHLORIDE CRYS ER 20 MEQ PO TBCR: 20.0000 meq | EXTENDED_RELEASE_TABLET | Freq: Every day | ORAL | 1 refills | Status: DC

## 2024-02-04 MED ORDER — SACUBITRIL-VALSARTAN 24-26 MG PO TABS: 1.0000 | ORAL_TABLET | Freq: Two times a day (BID) | ORAL | 5 refills | Status: DC

## 2024-02-04 MED ORDER — SACUBITRIL-VALSARTAN 24-26 MG PO TABS
1.0000 | ORAL_TABLET | Freq: Two times a day (BID) | ORAL | 5 refills | Status: AC
  Filled 2024-02-04 (×2): qty 60, 30d supply, fill #0

## 2024-02-04 MED ORDER — DIGOXIN 125 MCG PO TABS
0.0625 mg | ORAL_TABLET | Freq: Every day | ORAL | 1 refills | Status: DC
  Filled 2024-02-04: qty 45, 90d supply, fill #0

## 2024-02-04 MED ORDER — FUROSEMIDE 20 MG PO TABS: ORAL_TABLET | ORAL | 5 refills | Status: DC

## 2024-02-04 MED ORDER — POTASSIUM CHLORIDE CRYS ER 20 MEQ PO TBCR
20.0000 meq | EXTENDED_RELEASE_TABLET | Freq: Every day | ORAL | 1 refills | Status: AC
  Filled 2024-02-04 (×2): qty 90, 90d supply, fill #0

## 2024-02-04 NOTE — Progress Notes (Signed)
 " PCP: Dr. Norleen Cardiology: Dr. Okey HF Cardiology: Dr. Rolan  Chief complaint: CHF  79 y.o.with history of VSD repair as a child, COPD/active smoking, PAD, and chronic systolic CHF.  Patient has a long history of cardiomyopathy.  She had VSD repaired as a child and imaging has not showed residual VSD.  Cardiac MRI in 11/09 showed EF 36%.  Echo in 12/15 showed EF 40-45%.  Echo in 3/18 showed EF down to 15-20% with moderate RV systolic dysfunction.  RHC/LHC in 10/18 showed nonobstructive coronary but was concerning for low output (CI 1.33).  She had a nonhealing right lower extremity wound.  Peripheral angiogram with bilateral CFA occlusions and underwent DCB/self expanding stent to to right CFA.    CPX in 1/19 showed moderate to severe functional limitation, combination of HF and lung disease, probably more related to the lung disease.  PFTs in 12/18 showed severe COPD.   She had a [employer] CRT-D device placed.    7/19 peripheral arterial dopplers showed significant in-stent restenosis right CFA stent.  Echo in 4/21 showed EF 35-40% with mild AI.  CPX in 5/21 showed severe COPD, mild-moderate HF limitation.  11/21 Cardiolite showed prior septal and apical infarction, no ischemia.   Had COVID April 2022.   Echo in 9/22 showed EF 35-40% with basal-mid septal akinesis and anterior hypokinesis, no evidence for residual VSD, mildly decreased RV systolic function.  Echo in 11/23 showed EF 40% with basal to mid anteroseptal and inferoseptal akinesis, mild RV dysfunction, PASP 33 mmHg.  Echo in 9/24 showed EF 40%, basal-mid inferoseptal and anteroseptal akinesis, normal RV.   Aortoiliac dopplers in 5/25 showed right iliac disease.  No claudication, so managed medically.   Echo in 11/25 showed EF 45-50%, moderate LVH, normal RV, moderate TR, IVC normal.   She was admitted twice in 12/25 with COPD exacerbation/PNA. She was treated with steroids and antibiotics.   She returns for followup  of CHF. She is staying off cigarettes.  She is still on prednisone  5 mg daily.  She has been eating out at fast food restaurants and has ankle swelling. Weight is down compared to last appointment but is up compared to a few weeks ago.  She has been taking Lasix  20 mg daily since last hospitalization but increased Lasix  to 40 mg today with peripheral edema.  She uses 2 L home oxygen  at night.  No dyspnea walking around the house, gets short of breath walking longer distances.  No chest pain.  No orthopnea/PND.  No palpitations.  No lightheadedness.   Labs (4/24): hgb 14.1, K 4.2, creatinine 1.14, digoxin  level 0.6 Labs (7/24): K 4.9, creatinine 1.05, hgb 13.6, LDL 68 Labs (8/24): digoxin  0.9, BNP 347 Labs (1/25): K 3.9, creatinine 1.27, BNP 263 Labs (2/25): LDL 66 Labs (3/25): K 5.1, creatinine 1.33, digoxin  level 0.6 Labs (12/25): digoxin  0.9, K 4, creatinine 1.28  PMH: 1. VSD: s/p repair at [facility] as child.  From description, sounds like muscular VSD.  2. Atrial tachycardia s/p ablation.  3. Hyperlipidemia 4. COPD: Quit smoking 2024 - PFTs (12/18): Severe obstructive lung disease.  5. Atrial fibrillation: Paroxysmal.   Noted only transiently.   6. PAD:  - Angiogram 11/18 showed totally occluded bilateral CFAs.  Patient had stent to right CFA.  ABIs in 12/18 were normal on right. - ABIs (10/20): ABI 0.8 right, 0.74 left - 12/21 dopplers with R CFA stent in-stenosis.  - ABIs (1/24): normal on right, 0.68 on left with occluded  left CFA.  - Aortoiliac dopplers (5/25): Iliac disease on right.  7. Chronic systolic CHF: Nonischemic cardiomyopathy.   - cMRI (11/09): EF 36%, VSD patch in anterior ventricular septum - Echo (12/15): EF 40-45% - Echo (3/18): EF 15-20%, moderate LV dilation, moderate MR, moderate RV dilation with moderately decreased systolic function.  - LHC/RHC (10/18): 3+ MR, EF < 25%, minimal nonobstructive CAD; PA 41/21, LVEDP 18, CI 1.33.  - CPX (1/19): peak VO2 13.4, VE/VCO2  slope 43, RER 1.07.  Mod-severe functional limitation due to combination of HF and lung disease, probably more related to lung disease.  - [name] CRT-D device.  - Echo (7/19): EF 25-30%, mild LV dilation, mild MR, normal RV size and systolic function.  - Echo (4/21): EF 35-40%, mild AI - CPX (5/21): severe COPD, mild-moderate HF limitation.  - Cardiolite (11/21): EF 47%, prior septal and apical infarction, no ischemia. - Echo (9/22): EF 35-40% with basal-mid septal akinesis and anterior hypokinesis, no evidence for residual VSD, mildly decreased RV systolic function.  - Echo (11/23): EF 40% with basal to mid anteroseptal and inferoseptal akinesis, mild RV dysfunction, PASP 33 mmHg.  - Echo (9/24): EF 40%, basal-mid inferoseptal and anteroseptal akinesis, normal RV.  - Echo (11/25):  EF 45-50%, moderate LVH, moderate TR, normal RV, IVC normal. 8. PVCs: Zio patch 9/19 with 6% PVCs.  9. COVID-19 (4/22)  SH: Smoker, lives in [place], married.  FH: Mother with PAD, sister died at birth from congenital heart disease.   ROS: All systems reviewed and negative except as per HPI.   Current Outpatient Medications  Medication Sig Dispense Refill   albuterol  (PROVENTIL ) (2.5 MG/3ML) 0.083% nebulizer solution USE 1 VIAL (3 ML=2.5 MG TOTAL) BY NEBULIZATOIN EVERY 6 HOURS AS NEEDED FORWHEEZING OR SHORTNESS OF BREATH 90 mL 5   albuterol  (VENTOLIN  HFA) 108 (90 Base) MCG/ACT inhaler Inhale 2 puffs into the lungs every 6 (six) hours as needed for wheezing or shortness of breath. 8.5 g 5   aspirin  81 MG tablet Take 1 tablet (81 mg total) by mouth daily. 100 tablet 99   bisoprolol  (ZEBETA ) 5 MG tablet Take 2.5 mg by mouth daily.     Budeson-Glycopyrrol-Formoterol  (BREZTRI  AEROSPHERE) 160-9-4.8 MCG/ACT AERO Inhale 2 puffs into the lungs in the morning and at bedtime. 1 each 11   budesonide -glycopyrrolate-formoterol  (BREZTRI  AEROSPHERE) 160-9-4.8 MCG/ACT AERO inhaler Inhale 2 puffs into the lungs in  the morning and at bedtime. 3 each 4   rosuvastatin  (CRESTOR ) 10 MG tablet Take 1 tablet (10 mg total) by mouth daily. 90 tablet 3   spironolactone  (ALDACTONE ) 25 MG tablet Take 1 tablet (25 mg total) by mouth at bedtime. 30 tablet 6   ALPRAZolam  (XANAX ) 0.5 MG tablet TAKE 1 TABLET BY MOUTH TWICE PER DAY AS NEEDED 60 tablet 5   budesonide -glycopyrrolate-formoterol  (BREZTRI  AEROSPHERE) 160-9-4.8 MCG/ACT AERO inhaler 2 samples     digoxin  (LANOXIN ) 0.125 MG tablet Take 0.5 tablets (0.0625 mg total) by mouth daily. PLEASE SCHEDULE APPOINTMENT FOR MORE REFILLS [phone] OPTION 2 45 tablet 1   furosemide  (LASIX ) 20 MG tablet Take 2 tablets (40 mg total) by mouth every morning AND 1 tablet (20 mg total) every evening. 45 tablet 5   potassium chloride  SA (KLOR-CON  M20) 20 MEQ tablet Take 1 tablet (20 mEq total) by mouth daily. 90 tablet 1   sacubitril -valsartan  (ENTRESTO ) 24-26 MG Take 1 tablet by mouth 2 (two) times daily. 60 tablet 5   tizanidine  (ZANAFLEX ) 2 MG capsule Take 2 mg  by mouth as needed for muscle spasms.     traMADol  (ULTRAM ) 50 MG tablet Take 50 mg by mouth every 6 (six) hours as needed.     No current facility-administered medications for this visit.   BP (!) 111/47   Pulse 67   Wt 105 lb 3.2 oz (47.7 kg)   SpO2 94%   BMI 19.88 kg/m   Wt Readings from Last 3 Encounters:  02/04/24 105 lb 3.2 oz (47.7 kg)  01/26/24 102 lb 3.2 oz (46.4 kg)  01/20/24 99 lb 10.4 oz (45.2 kg)  General: NAD Neck: JVP 9-10 cm, no thyromegaly or thyroid  nodule.  Lungs: Clear to auscultation bilaterally with normal respiratory effort. CV: Nondisplaced PMI.  Heart regular S1/S2, no S3/S4, 2/6 HSM LLSB.  1+ ankle edema.  No carotid bruit.  Difficult to palpate pedal pulses.  Abdomen: Soft, nontender, no hepatosplenomegaly, no distention.  Skin: Intact without lesions or rashes.  Neurologic: Alert and oriented x 3.  Psych: Normal affect. Extremities: No clubbing or cyanosis.  HEENT: Normal.    Assessment/Plan: 1.  PAD: She had endovascular intervention to occluded right CFA in 11/18, but subsequent arterial showed in-stent restenosis. She has occluded left CFA also that was not intervened on. Aorto-iliac dopplers in 5/25 showed right iliac disease.  Still with minimal claudication, no pedal ulcers.  She has quit smoking.  Follows with Dr. Darron for PV with plan for medical management unless claudication worsens.  - Continue statin.  2. CAD: Nonobstructive on 10/18 cath. Cardiolite in 11/21 with no evidence of ischemia. No recent chest pain.  - She is on ASA 81 and statin.   3. Hyperlipidemia: Continue crestor , check lipids today.  4. COPD: Severe by PFTs on 5/21 CPX. COPD plays a significant role in her dyspnea. She has finally quit smoking.  Follows with pulmonary.  She had 2 COPD exacerbations recently and is currently on prednisone  5 mg daily.  Follows with Dr. Darlean.  5. Chronic systolic CHF: Nonischemic cardiomyopathy.  RHC/LHC in 10/18 with no nonobstructive coronary disease but CI was very low at 1.33.  Her low cardiac output was out of proportion to her symptoms.  [name] CRT-D device.  CPX in 5/21 showed severe COPD but only mild-moderate HF limitation.   Echo with EF stable at 35-40% in 9/22 and EF 40% on 11/23 echo.  Echo in 9/24 was stable with EF 40%, basal-mid inferoseptal and anteroseptal akinesis, normal RV. Echo 11/25 showed EF 45-50%, moderate LVH, moderate TR, normal RV, IVC normal.  Chronic NYHA class III, symptoms confounded by severe COPD.  She is volume overloaded on exam and reports recent dietary indiscretion with sodium.    - Increase Lasix  to 40 qam/20 qpm and add KCl 20 daily.  BMET/BNP today, BMET in 10 days.   - Restart Entresto  24/26 bid, she has been off this for about a month.  - Continue bisoprolol  2.5 mg daily.  - Continue spironolactone  25 mg daily.  - Continue digoxin , check level today.  - Unable to tolerate Farxiga .   Followup 2 wks with  APP.   I spent 32 minutes reviewing records, interviewing/examining patient, and managing orders.   Ezra Shuck 02/04/2024 "

## 2024-02-04 NOTE — Patient Instructions (Signed)
 Medication Changes:  INCREASE Furosemide  to 40mg  (2 tabs) every morning and 20mg  (1 tab) every evening.  RESTART Entresto  24/26 (1 tab) two times daily  START Potassium 20meq daily  Lab Work:  Go over to the [facility]. Go pass the gift shop and have your blood work completed.  Go downstairs to [facility] on LOWER LEVEL to have your blood work completed.  We will only call you if the results are abnormal or if the provider would like to make medication changes.  No news is good news.    Follow-Up in: Please follow up with the [facility] in 2 weeks with Ellouise Class, FNP.   Thank you for choosing [facility].    At the [facility], you and your health needs are our priority. We have a designated team specialized in the treatment of Heart Failure. This Care Team includes your primary Heart Failure Specialized Cardiologist (physician), Advanced Practice Providers (APPs- Physician Assistants and Nurse Practitioners), and Pharmacist who all work together to provide you with the care you need, when you need it.   You may see any of the following providers on your designated Care Team at your next follow up:  Dr. Toribio Fuel Dr. Ezra Shuck Dr. Ria Commander Dr. Morene Brownie Ellouise Class, FNP Jaun Bash, RPH-CPP  Please be sure to bring in all your medications bottles to every appointment.   Need to Contact Us :  If you have any questions or concerns before your next appointment please send us  a message through [place] or call our office at [phone].    TO LEAVE A MESSAGE FOR THE NURSE SELECT OPTION 2, PLEASE LEAVE A MESSAGE INCLUDING: YOUR NAME DATE OF BIRTH CALL BACK NUMBER REASON FOR CALL**this is important as we prioritize the call backs  YOU WILL RECEIVE A CALL BACK THE SAME DAY AS LONG AS YOU CALL BEFORE 4:00 PM

## 2024-02-04 NOTE — Telephone Encounter (Signed)
 Advanced Heart Failure Patient Advocate Encounter  The patient was approved for a [employer] grant that will help cover the cost of Digoxin , Entresto .  Total amount awarded, $7,500.  Effective: 01/05/2024 - 01/03/2025.  BIN [id number] PCN [id number] Group [id number] ID [id number]  Pharmacy provided with approval and processing information. Patient informed while in office.  Rachel DEL, CPhT Rx Patient Advocate Phone: [phone]

## 2024-02-05 LAB — BASIC METABOLIC PANEL WITH GFR
BUN/Creatinine Ratio: 13 (ref 12–28)
BUN: 18 mg/dL (ref 8–27)
CO2: 22 mmol/L (ref 20–29)
Calcium: 9.1 mg/dL (ref 8.7–10.3)
Chloride: 97 mmol/L (ref 96–106)
Creatinine, Ser: 1.43 mg/dL — ABNORMAL HIGH (ref 0.57–1.00)
Glucose: 137 mg/dL — ABNORMAL HIGH (ref 70–99)
Potassium: 4.6 mmol/L (ref 3.5–5.2)
Sodium: 140 mmol/L (ref 134–144)
eGFR: 38 mL/min/1.73 — ABNORMAL LOW

## 2024-02-05 LAB — LIPID PANEL
Chol/HDL Ratio: 1.7 ratio (ref 0.0–4.4)
Cholesterol, Total: 174 mg/dL (ref 100–199)
HDL: 102 mg/dL
LDL Chol Calc (NIH): 55 mg/dL (ref 0–99)
Triglycerides: 98 mg/dL (ref 0–149)
VLDL Cholesterol Cal: 17 mg/dL (ref 5–40)

## 2024-02-05 LAB — BRAIN NATRIURETIC PEPTIDE: BNP: 499.1 pg/mL — ABNORMAL HIGH (ref 0.0–100.0)

## 2024-02-05 LAB — DIGOXIN LEVEL: Digoxin, Serum: 0.7 ng/mL (ref 0.5–0.9)

## 2024-02-07 ENCOUNTER — Ambulatory Visit (HOSPITAL_COMMUNITY): Payer: Self-pay | Admitting: Cardiology

## 2024-02-08 ENCOUNTER — Telehealth: Payer: Self-pay | Admitting: Cardiology

## 2024-02-08 ENCOUNTER — Telehealth (HOSPITAL_COMMUNITY): Payer: Self-pay

## 2024-02-08 MED ORDER — FUROSEMIDE 20 MG PO TABS: 40.0000 mg | ORAL_TABLET | Freq: Two times a day (BID) | ORAL | Status: DC

## 2024-02-08 NOTE — Telephone Encounter (Signed)
 Patient called in stating that her feet and ankles are very swollen. Reports she recently increased her furosemide  to 40mg am/20mg  pm since last week when she saw Dr. Mclean. Patient reports that she has been drinking a lot of Pedialyte, water, and coffee. Advised patient that Pedialyte has a lot of sodium therefore she should avoid this. Patient states that she is not super short of breath, just with certain activities this is not new for her. Reports that she isn't making a lot of urine during the day but urinates quite a bit at night. Patient would like to know if she needs to increase her furosemide  more.   Spoke with Dr. Mclean- advised to increase fuorsemide to 40mg  BID, and stop pedialyte. Follow up with APP clinic in the next week or so.  Called and spoke with patient made he aware of the above, and she verbalized understanding. Patient is scheduled to see Ellouise Class next week. Advised patient to keep this apt. Patient verbalized understanding.

## 2024-02-09 ENCOUNTER — Other Ambulatory Visit: Payer: Self-pay

## 2024-02-11 ENCOUNTER — Other Ambulatory Visit (HOSPITAL_COMMUNITY): Payer: Self-pay

## 2024-02-11 MED ORDER — DIGOXIN 125 MCG PO TABS: 0.0625 mg | ORAL_TABLET | Freq: Every day | ORAL | 1 refills | Status: AC

## 2024-02-11 MED ORDER — BISOPROLOL FUMARATE 5 MG PO TABS: 2.5000 mg | ORAL_TABLET | Freq: Every day | ORAL | 1 refills | Status: AC

## 2024-02-11 MED ORDER — ROSUVASTATIN CALCIUM 10 MG PO TABS: 10.0000 mg | ORAL_TABLET | Freq: Every day | ORAL | 3 refills | Status: AC

## 2024-02-12 ENCOUNTER — Other Ambulatory Visit: Payer: Self-pay

## 2024-02-12 ENCOUNTER — Encounter

## 2024-02-12 MED ORDER — SPIRONOLACTONE 25 MG PO TABS: 25.0000 mg | ORAL_TABLET | Freq: Every evening | ORAL | 6 refills | Status: AC

## 2024-02-13 ENCOUNTER — Ambulatory Visit: Payer: Self-pay | Admitting: Internal Medicine

## 2024-02-13 DIAGNOSIS — J9611 Chronic respiratory failure with hypoxia: Secondary | ICD-10-CM

## 2024-02-16 ENCOUNTER — Other Ambulatory Visit: Payer: Self-pay

## 2024-02-17 ENCOUNTER — Telehealth: Payer: Self-pay | Admitting: Family

## 2024-02-17 NOTE — Progress Notes (Unsigned)
 " PCP: Dr. Norleen Cardiology: Dr. Okey HF Cardiology: Dr. Rolan  Chief complaint: shortness of breath   HPI: Kimberly [name] is a 79 y.o. female with history of VSD repair as a child, COPD/active smoking, PAD, and chronic systolic CHF.  Patient has a long history of cardiomyopathy.  She had VSD repaired as a child and imaging has not showed residual VSD.  Cardiac MRI in 11/09 showed EF 36%.  Echo in 12/15 showed EF 40-45%.  Echo in 3/18 showed EF down to 15-20% with moderate RV systolic dysfunction.  RHC/LHC in 10/18 showed nonobstructive coronary but was concerning for low output (CI 1.33).  She had a nonhealing right lower extremity wound.  Peripheral angiogram with bilateral CFA occlusions and underwent DCB/self expanding stent to to right CFA.    CPX in 1/19 showed moderate to severe functional limitation, combination of HF and lung disease, probably more related to the lung disease.  PFTs in 12/18 showed severe COPD.   She had a [employer] CRT-D device placed.    7/19 peripheral arterial dopplers showed significant in-stent restenosis right CFA stent.  Echo in 4/21 showed EF 35-40% with mild AI.  CPX in 5/21 showed severe COPD, mild-moderate HF limitation.  11/21 Cardiolite showed prior septal and apical infarction, no ischemia.   Had COVID April 2022.   Echo in 9/22 showed EF 35-40% with basal-mid septal akinesis and anterior hypokinesis, no evidence for residual VSD, mildly decreased RV systolic function.  Echo in 11/23 showed EF 40% with basal to mid anteroseptal and inferoseptal akinesis, mild RV dysfunction, PASP 33 mmHg.  Echo in 9/24 showed EF 40%, basal-mid inferoseptal and anteroseptal akinesis, normal RV.   Aortoiliac dopplers in 5/25 showed right iliac disease.  No claudication, so managed medically.   Echo in 11/25 showed EF 45-50%, moderate LVH, normal RV, moderate TR, IVC normal.   She was admitted twice in 12/25 with COPD exacerbation/PNA. She was treated with  steroids and antibiotics.   Seen in [facility] 12/25 volume up so lasix  was increased to 40am/ 20pm and potassium 20 was started. Entresto  24/26mg  BID was also restarted  Called into clinic 02/08/24 saying her feet/ ankles were very swollen. Had been drinking a lot of pedialyte. Provider instructed to increase furosemide  to 40mg  BID and stop drinking pedialyte.   She returns for HF follow-up visit with a chief complaint of shortness of breath. Has associated fatigue, bilateral leg cramping, intermittent chest tightness. Denies chest pain, palpitations, edema or difficulty sleeping. She self reduced her lasix  back to 40mg  AM / 20mg  PM 4 days ago because she felt better. Has not been taking potassium but has it at home. Confirms that she's taking spironolactone . Still eats high sodium foods and ate a hot dog all the way yesterday.   [employer] device interrogated today: HL score of 6, stable impedance, activity 1.9 h/ day, 0 h/d of AT/AF  Labs (4/24): hgb 14.1, K 4.2, creatinine 1.14, digoxin  level 0.6 Labs (7/24): K 4.9, creatinine 1.05, hgb 13.6, LDL 68 Labs (8/24): digoxin  0.9, BNP 347 Labs (1/25): K 3.9, creatinine 1.27, BNP 263 Labs (2/25): LDL 66 Labs (3/25): K 5.1, creatinine 1.33, digoxin  level 0.6 Labs (12/25): digoxin  0.9, K 4, creatinine 1.28 Labs (1/26): K 4.6, creatinine 1.43, BNP 499.1, LDL 55, digoxin  level 0.7  PMH: 1. VSD: s/p repair at [facility] as child.  From description, sounds like muscular VSD.  2. Atrial tachycardia s/p ablation.  3. Hyperlipidemia 4. COPD: Quit smoking 2024 - PFTs (  12/18): Severe obstructive lung disease.  5. Atrial fibrillation: Paroxysmal.   Noted only transiently.   6. PAD:  - Angiogram 11/18 showed totally occluded bilateral CFAs.  Patient had stent to right CFA.  ABIs in 12/18 were normal on right. - ABIs (10/20): ABI 0.8 right, 0.74 left - 12/21 dopplers with R CFA stent in-stenosis.  - ABIs (1/24): normal on right, 0.68 on left with occluded left CFA.  -  Aortoiliac dopplers (5/25): Iliac disease on right.  7. Chronic systolic CHF: Nonischemic cardiomyopathy.   - cMRI (11/09): EF 36%, VSD patch in anterior ventricular septum - Echo (12/15): EF 40-45% - Echo (3/18): EF 15-20%, moderate LV dilation, moderate MR, moderate RV dilation with moderately decreased systolic function.  - LHC/RHC (10/18): 3+ MR, EF < 25%, minimal nonobstructive CAD; PA 41/21, LVEDP 18, CI 1.33.  - CPX (1/19): peak VO2 13.4, VE/VCO2 slope 43, RER 1.07.  Mod-severe functional limitation due to combination of HF and lung disease, probably more related to lung disease.  - [name] CRT-D device.  - Echo (7/19): EF 25-30%, mild LV dilation, mild MR, normal RV size and systolic function.  - Echo (4/21): EF 35-40%, mild AI - CPX (5/21): severe COPD, mild-moderate HF limitation.  - Cardiolite (11/21): EF 47%, prior septal and apical infarction, no ischemia. - Echo (9/22): EF 35-40% with basal-mid septal akinesis and anterior hypokinesis, no evidence for residual VSD, mildly decreased RV systolic function.  - Echo (11/23): EF 40% with basal to mid anteroseptal and inferoseptal akinesis, mild RV dysfunction, PASP 33 mmHg.  - Echo (9/24): EF 40%, basal-mid inferoseptal and anteroseptal akinesis, normal RV.  - Echo (11/25):  EF 45-50%, moderate LVH, moderate TR, normal RV, IVC normal. 8. PVCs: Zio patch 9/19 with 6% PVCs.  9. COVID-19 (4/22)  SH: Smoker, lives in [place], married.  FH: Mother with PAD, sister died at birth from congenital heart disease.   ROS: All systems reviewed and negative except as per HPI.   Current Outpatient Medications  Medication Sig Dispense Refill   albuterol  (PROVENTIL ) (2.5 MG/3ML) 0.083% nebulizer solution USE 1 VIAL (3 ML=2.5 MG TOTAL) BY NEBULIZATOIN EVERY 6 HOURS AS NEEDED FORWHEEZING OR SHORTNESS OF BREATH 90 mL 5   albuterol  (VENTOLIN  HFA) 108 (90 Base) MCG/ACT inhaler Inhale 2 puffs into the lungs every 6 (six) hours as needed for  wheezing or shortness of breath. 8.5 g 5   ALPRAZolam  (XANAX ) 0.5 MG tablet TAKE 1 TABLET BY MOUTH TWICE PER DAY AS NEEDED 60 tablet 5   aspirin  81 MG tablet Take 1 tablet (81 mg total) by mouth daily. 100 tablet 99   bisoprolol  (ZEBETA ) 5 MG tablet Take 0.5 tablets (2.5 mg total) by mouth daily. 45 tablet 1   Budeson-Glycopyrrol-Formoterol  (BREZTRI  AEROSPHERE) 160-9-4.8 MCG/ACT AERO Inhale 2 puffs into the lungs in the morning and at bedtime. 1 each 11   budesonide -glycopyrrolate-formoterol  (BREZTRI  AEROSPHERE) 160-9-4.8 MCG/ACT AERO inhaler Inhale 2 puffs into the lungs in the morning and at bedtime. 3 each 4   budesonide -glycopyrrolate-formoterol  (BREZTRI  AEROSPHERE) 160-9-4.8 MCG/ACT AERO inhaler 2 samples     digoxin  (LANOXIN ) 0.125 MG tablet Take 0.5 tablets (0.0625 mg total) by mouth daily. PLEASE SCHEDULE APPOINTMENT FOR MORE REFILLS [phone] OPTION 2 45 tablet 1   furosemide  (LASIX ) 20 MG tablet Take 2 tablets (40 mg total) by mouth 2 (two) times daily.     potassium chloride  SA (KLOR-CON  M20) 20 MEQ tablet Take 1 tablet (20 mEq total) by mouth daily. 90 tablet  1   rosuvastatin  (CRESTOR ) 10 MG tablet Take 1 tablet (10 mg total) by mouth daily. 90 tablet 3   sacubitril -valsartan  (ENTRESTO ) 24-26 MG Take 1 tablet by mouth 2 (two) times daily. 60 tablet 5   spironolactone  (ALDACTONE ) 25 MG tablet Take 1 tablet (25 mg total) by mouth at bedtime. 30 tablet 6   tizanidine  (ZANAFLEX ) 2 MG capsule Take 2 mg by mouth as needed for muscle spasms.     traMADol  (ULTRAM ) 50 MG tablet Take 50 mg by mouth every 6 (six) hours as needed.     No current facility-administered medications for this visit.   Vitals:   02/18/24 0951 02/18/24 1012  BP: (!) 86/39 100/60  Pulse: 60   SpO2: 94%   Weight: 105 lb 4 oz (47.7 kg)    Wt Readings from Last 3 Encounters:  02/18/24 105 lb 4 oz (47.7 kg)  02/04/24 105 lb 3.2 oz (47.7 kg)  01/26/24 102 lb 3.2 oz (46.4 kg)   Lab Results  Component Value Date    CREATININE 1.43 (H) 02/04/2024   CREATININE 1.28 (H) 01/18/2024   CREATININE 1.07 (H) 01/17/2024     Physical Exam: General: Well appearing, thin female in NAD  Cor: No JVD. Regular rhythm, rate. 2/6 HSM LLSB. Lungs: clear Abdomen: soft, nontender, nondistended. Extremities: trace pitting edema around left ankle Neuro:. Affect pleasant  Assessment/Plan: 1. Chronic systolic CHF: Nonischemic cardiomyopathy.  RHC/LHC in 10/18 with no nonobstructive coronary disease but CI was very low at 1.33.  Her low cardiac output was out of proportion to her symptoms.  [name] CRT-D device.  CPX in 5/21 showed severe COPD but only mild-moderate HF limitation.   Echo with EF stable at 35-40% in 9/22 and EF 40% on 11/23 echo.  Echo in 9/24 was stable with EF 40%, basal-mid inferoseptal and anteroseptal akinesis, normal RV. Echo 11/25 showed EF 45-50%, moderate LVH, moderate TR, normal RV, IVC normal.  Chronic NYHA class III, symptoms confounded by severe COPD.  She is euvolemic today - weight stable from last visit here 2 weeks ago - HL score today of 6 - Decrease furosemide  back to 40mg  AM. She did not start the KCl 20 daily from last visit. Will get labs back as she probably needs supplementation. BMET / proBNP today - Continue Entresto  24/26 bid. BP does not allow for titration - Continue bisoprolol  2.5 mg daily.  - Continue spironolactone  25 mg daily.  - Continue digoxin  - Unable to tolerate Farxiga .  - Approved for grant that will cover digoxin , entresto  - reviewed high sodium foods, yesterday ate hot dog all the way - encouraged to wear compression socks daily especially with her multiple varicosities. Can get zip up socks to make it easier to get them on. 2.  PAD: She had endovascular intervention to occluded right CFA in 11/18, but subsequent arterial showed in-stent restenosis. She has occluded left CFA also that was not intervened on. Aorto-iliac dopplers in 5/25 showed right iliac  disease.  Still with minimal claudication, no pedal ulcers.  She has quit smoking.  Follows with Dr. Darron for PV with plan for medical management unless claudication worsens.  - Continue statin.  3. CAD: Nonobstructive on 10/18 cath. Cardiolite in 11/21 with no evidence of ischemia. No recent chest pain.  - She is on ASA 81 and statin.   4. Hyperlipidemia: Continue crestor . LDL 1/26 was 55 5. COPD: Severe by PFTs on 5/21 CPX. COPD plays a significant role in her dyspnea.  She has finally quit smoking. She had 2 COPD exacerbations recently and is currently on prednisone  5 mg daily.  Follows with pulmonology Dr. Darlean.  - Overnight oximetry done 01/26 with no desats so no need for oxygen    Return in 2 weeks, sooner if needed.   I spent 49 minutes reviewing records, interviewing/ examing patient and managing plan/ orders.    Ellouise DELENA Class FNP 02/17/2024 "

## 2024-02-17 NOTE — Telephone Encounter (Signed)
 Faxed signed CMN to [facility] at [phone]. Fax confirmation received, NFN.

## 2024-02-17 NOTE — Telephone Encounter (Signed)
 Called to confirm/remind patient of their appointment at the [facility] on 02/18/24.   Appointment:   [x] Confirmed  [] Left mess   [] No answer/No voice mail  [] VM Full/unable to leave message  [] Phone not in service  Patient reminded to bring all medications and/or complete list.  Confirmed patient has transportation. Gave directions, instructed to utilize valet parking.

## 2024-02-18 ENCOUNTER — Ambulatory Visit: Admitting: Family

## 2024-02-18 ENCOUNTER — Encounter: Payer: Self-pay | Admitting: Family

## 2024-02-18 ENCOUNTER — Ambulatory Visit: Payer: Self-pay | Admitting: Family

## 2024-02-18 ENCOUNTER — Other Ambulatory Visit
Admission: RE | Admit: 2024-02-18 | Discharge: 2024-02-18 | Disposition: A | Discharge status: Home / Self Care | Source: Ambulatory Visit | Attending: Family | Admitting: Family

## 2024-02-18 VITALS — BP 100/60 | HR 60 | Wt 105.2 lb

## 2024-02-18 DIAGNOSIS — I2510 Atherosclerotic heart disease of native coronary artery without angina pectoris: Secondary | ICD-10-CM | POA: Diagnosis not present

## 2024-02-18 DIAGNOSIS — J449 Chronic obstructive pulmonary disease, unspecified: Secondary | ICD-10-CM

## 2024-02-18 DIAGNOSIS — I5022 Chronic systolic (congestive) heart failure: Secondary | ICD-10-CM | POA: Insufficient documentation

## 2024-02-18 DIAGNOSIS — E782 Mixed hyperlipidemia: Secondary | ICD-10-CM | POA: Diagnosis not present

## 2024-02-18 DIAGNOSIS — I739 Peripheral vascular disease, unspecified: Secondary | ICD-10-CM

## 2024-02-18 LAB — BASIC METABOLIC PANEL WITH GFR
Anion gap: 10 (ref 5–15)
BUN: 25 mg/dL — ABNORMAL HIGH (ref 8–23)
CO2: 30 mmol/L (ref 22–32)
Calcium: 9.3 mg/dL (ref 8.9–10.3)
Chloride: 99 mmol/L (ref 98–111)
Creatinine, Ser: 1.32 mg/dL — ABNORMAL HIGH (ref 0.44–1.00)
GFR, Estimated: 42 mL/min — ABNORMAL LOW
Glucose, Bld: 97 mg/dL (ref 70–99)
Potassium: 4.4 mmol/L (ref 3.5–5.1)
Sodium: 139 mmol/L (ref 135–145)

## 2024-02-18 LAB — PRO BRAIN NATRIURETIC PEPTIDE: Pro Brain Natriuretic Peptide: 1648 pg/mL — ABNORMAL HIGH

## 2024-02-18 MED ORDER — FUROSEMIDE 20 MG PO TABS: 40.0000 mg | ORAL_TABLET | Freq: Every day | ORAL | 1 refills | Status: AC

## 2024-02-18 NOTE — Patient Instructions (Signed)
 Medication Changes:  DECREASE Furosemide  to 40mg  daily  Lab Work:  Go over to the [facility]. Go pass the gift shop and have your blood work completed.   We will only call you if the results are abnormal or if the provider would like to make medication changes.  No news is good news.   Follow-Up in: Please follow up with the [facility] in 2 weeks with Kimberly Class, FNP   Thank you for choosing [facility].    At the [facility], you and your health needs are our priority. We have a designated team specialized in the treatment of Heart Failure. This Care Team includes your primary Heart Failure Specialized Cardiologist (physician), Advanced Practice Providers (APPs- Physician Assistants and Nurse Practitioners), and Pharmacist who all work together to provide you with the care you need, when you need it.   You may see any of the following providers on your designated Care Team at your next follow up:  Dr. Toribio Fuel Dr. Ezra Shuck Dr. Ria Commander Dr. Morene Brownie Kimberly Class, FNP Kimberly [name], RPH-CPP  Please be sure to bring in all your medications bottles to every appointment.   Need to Contact Us :  If you have any questions or concerns before your next appointment please send us  a message through [place] or call our office at [phone].    TO LEAVE A MESSAGE FOR THE NURSE SELECT OPTION 2, PLEASE LEAVE A MESSAGE INCLUDING: YOUR NAME DATE OF BIRTH CALL BACK NUMBER REASON FOR CALL**this is important as we prioritize the call backs  YOU WILL RECEIVE A CALL BACK THE SAME DAY AS LONG AS YOU CALL BEFORE 4:00 PM

## 2024-02-23 ENCOUNTER — Encounter: Payer: Self-pay | Admitting: Cardiovascular Disease

## 2024-02-24 ENCOUNTER — Ambulatory Visit: Admitting: Primary Care

## 2024-03-01 ENCOUNTER — Ambulatory Visit: Admitting: Adult Health

## 2024-03-04 ENCOUNTER — Ambulatory Visit: Admitting: Family

## 2024-03-16 ENCOUNTER — Ambulatory Visit: Payer: Medicare Other

## 2024-03-25 ENCOUNTER — Ambulatory Visit

## 2024-04-05 ENCOUNTER — Encounter

## 2024-04-07 ENCOUNTER — Ambulatory Visit: Admitting: Adult Health

## 2024-06-15 ENCOUNTER — Ambulatory Visit: Payer: Medicare Other
